# Patient Record
Sex: Female | Born: 1945 | Race: Black or African American | Hispanic: No | Smoking: Former smoker
Health system: Southern US, Community
[De-identification: ages and names within clinical notes are randomized; demographics above are authoritative.]

## PROBLEM LIST (undated history)

## (undated) DIAGNOSIS — D649 Anemia, unspecified: Secondary | ICD-10-CM

## (undated) DIAGNOSIS — Z8679 Personal history of other diseases of the circulatory system: Secondary | ICD-10-CM

## (undated) DIAGNOSIS — K219 Gastro-esophageal reflux disease without esophagitis: Secondary | ICD-10-CM

## (undated) DIAGNOSIS — H9190 Unspecified hearing loss, unspecified ear: Secondary | ICD-10-CM

## (undated) DIAGNOSIS — K635 Polyp of colon: Secondary | ICD-10-CM

## (undated) DIAGNOSIS — M1990 Unspecified osteoarthritis, unspecified site: Secondary | ICD-10-CM

## (undated) DIAGNOSIS — M1711 Unilateral primary osteoarthritis, right knee: Secondary | ICD-10-CM

## (undated) DIAGNOSIS — I10 Essential (primary) hypertension: Secondary | ICD-10-CM

## (undated) DIAGNOSIS — I82409 Acute embolism and thrombosis of unspecified deep veins of unspecified lower extremity: Secondary | ICD-10-CM

## (undated) DIAGNOSIS — R05 Cough: Secondary | ICD-10-CM

## (undated) DIAGNOSIS — J069 Acute upper respiratory infection, unspecified: Secondary | ICD-10-CM

## (undated) DIAGNOSIS — M1712 Unilateral primary osteoarthritis, left knee: Secondary | ICD-10-CM

## (undated) DIAGNOSIS — I739 Peripheral vascular disease, unspecified: Secondary | ICD-10-CM

## (undated) DIAGNOSIS — C55 Malignant neoplasm of uterus, part unspecified: Secondary | ICD-10-CM

## (undated) DIAGNOSIS — Z803 Family history of malignant neoplasm of breast: Secondary | ICD-10-CM

## (undated) HISTORY — PX: COLONOSCOPY W/ BIOPSIES: SHX1374

## (undated) HISTORY — DX: Acute embolism and thrombosis of unspecified deep veins of unspecified lower extremity: I82.409

## (undated) HISTORY — DX: Unspecified osteoarthritis, unspecified site: M19.90

## (undated) HISTORY — DX: Gastro-esophageal reflux disease without esophagitis: K21.9

## (undated) HISTORY — DX: Malignant neoplasm of uterus, part unspecified: C55

## (undated) HISTORY — DX: Peripheral vascular disease, unspecified: I73.9

## (undated) HISTORY — DX: Family history of malignant neoplasm of breast: Z80.3

## (undated) HISTORY — DX: Anemia, unspecified: D64.9

## (undated) HISTORY — DX: Unilateral primary osteoarthritis, left knee: M17.12

## (undated) HISTORY — DX: Polyp of colon: K63.5

---

## 2003-07-05 ENCOUNTER — Emergency Department (HOSPITAL_COMMUNITY): Admission: EM | Admit: 2003-07-05 | Discharge: 2003-07-06 | Payer: Self-pay | Admitting: Emergency Medicine

## 2007-05-17 ENCOUNTER — Emergency Department (HOSPITAL_COMMUNITY): Admission: EM | Admit: 2007-05-17 | Discharge: 2007-05-17 | Payer: Self-pay | Admitting: Family Medicine

## 2009-05-19 ENCOUNTER — Emergency Department (HOSPITAL_COMMUNITY): Admission: EM | Admit: 2009-05-19 | Discharge: 2009-05-19 | Payer: Self-pay | Admitting: Emergency Medicine

## 2009-05-21 ENCOUNTER — Emergency Department (HOSPITAL_COMMUNITY): Admission: EM | Admit: 2009-05-21 | Discharge: 2009-05-21 | Payer: Self-pay | Admitting: Emergency Medicine

## 2010-02-18 HISTORY — PX: ABDOMINAL HYSTERECTOMY: SHX81

## 2010-05-09 LAB — CBC
HCT: 36.9 % (ref 36.0–46.0)
Hemoglobin: 12.1 g/dL (ref 12.0–15.0)
MCHC: 32.9 g/dL (ref 30.0–36.0)
MCV: 87.7 fL (ref 78.0–100.0)
Platelets: 286 10*3/uL (ref 150–400)
RBC: 4.21 MIL/uL (ref 3.87–5.11)
RDW: 13.8 % (ref 11.5–15.5)
WBC: 5.7 10*3/uL (ref 4.0–10.5)

## 2010-05-09 LAB — COMPREHENSIVE METABOLIC PANEL
ALT: 15 U/L (ref 0–35)
AST: 16 U/L (ref 0–37)
Albumin: 3.8 g/dL (ref 3.5–5.2)
Alkaline Phosphatase: 87 U/L (ref 39–117)
BUN: 14 mg/dL (ref 6–23)
CO2: 25 mEq/L (ref 19–32)
Calcium: 9.4 mg/dL (ref 8.4–10.5)
Chloride: 104 mEq/L (ref 96–112)
Creatinine, Ser: 0.66 mg/dL (ref 0.4–1.2)
GFR calc Af Amer: >60 mL/min (ref >60)
GFR calc non Af Amer: >60 mL/min (ref >60)
Glucose, Bld: 92 mg/dL (ref 70–99)
Potassium: 3.7 mEq/L (ref 3.5–5.1)
Sodium: 136 mEq/L (ref 135–145)
Total Bilirubin: 0.7 mg/dL (ref 0.3–1.2)
Total Protein: 7.6 g/dL (ref 6.0–8.3)

## 2010-05-09 LAB — DIFFERENTIAL
Basophils Absolute: 0.1 10*3/uL (ref 0.0–0.1)
Basophils Relative: 1 % (ref 0–1)
Eosinophils Absolute: 0.1 10*3/uL (ref 0.0–0.7)
Eosinophils Relative: 1 % (ref 0–5)
Lymphocytes Relative: 42 % (ref 12–46)
Lymphs Abs: 2.4 10*3/uL (ref 0.7–4.0)
Monocytes Absolute: 0.5 10*3/uL (ref 0.1–1.0)
Monocytes Relative: 9 % (ref 3–12)
Neutro Abs: 2.7 10*3/uL (ref 1.7–7.7)
Neutrophils Relative %: 47 % (ref 43–77)

## 2010-06-13 ENCOUNTER — Ambulatory Visit: Payer: Medicare Other | Attending: Gynecology | Admitting: Gynecology

## 2010-06-13 DIAGNOSIS — E669 Obesity, unspecified: Secondary | ICD-10-CM | POA: Insufficient documentation

## 2010-06-13 DIAGNOSIS — C549 Malignant neoplasm of corpus uteri, unspecified: Secondary | ICD-10-CM | POA: Insufficient documentation

## 2010-06-14 NOTE — Consult Note (Signed)
[name], [name]                ACCOUNT NO.:  [id number]  MEDICAL RECORD NO.:  [id number]          PATIENT TYPE:  LOCATION:                                 FACILITY:  PHYSICIAN:  De Blanch, M.D. DATE OF BIRTH:  DATE OF CONSULTATION:  06/13/2010 DATE OF DISCHARGE:                                CONSULTATION   CHIEF COMPLAINT:  Endometrial cancer.  HISTORY OF PRESENT ILLNESS:  A 65 year old [place] American female seen in consultation at the request of Dr. Jennette Kettle regarding management of a newly diagnosed endometrial cancer.  Patient reports that she has had bleeding for approximately 2 years and recently presented to Dr. Donnetta Hail office for evaluation.  She underwent a D and C on June 04, 2010, with findings of a polypoid lesion in the endometrial cavity as well as a submucous myoma.  The polypoid lesion was excised with ring forceps and revealed an adenocarcinoma arising in the background of polypoid complex atypical hyperplasia.  It was felt that the lesion was grade II. Patient is also known to have small uterine fibroids measuring 3.1 cm, 2.0 cm, and 1.6 cm.  The total uterine size is 9.6 cm in length, 5.1 cm in width.  The ovaries appear normal.  PAST MEDICAL HISTORY AND MEDICAL ILLNESSES:  Obesity.  PAST SURGICAL HISTORY:  D and C in 2012.  CURRENT MEDICATIONS:  None.  DRUG ALLERGIES:  None.  FAMILY HISTORY:  Patient has a niece with breast cancer.  There is no other gynecologic or colon cancer in the family history.  SOCIAL HISTORY:  The patient lived in [place] for a long period of time, but has now returned to live in her family home.  She shares the house with her brothers and comes accompanied by one of her sisters. 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  She does not smoke.  REVIEW OF SYSTEMS:  Ten-point comprehensive review of systems negative except as noted above.  PHYSICAL EXAMINATION:  VITAL SIGNS:  Height 5 feet, weight 249  pounds, blood pressure 138/70. GENERAL:  Patient is a pleasant African American female in no apparent distress. HEENT:  Negative except for prominent probable lipoma on her right cheek. NECK:  Supple without thyromegaly.  There is no supraclavicular or inguinal adenopathy. ABDOMEN:  The abdomen is very obese.  No masses, organomegaly, or ascites are noted, although compromised by her obesity. PELVIC EXAMINATION:  EGBUS is normal.  Vagina appears normal.  No lesions are noted.  The cervix descends to nearly the introitus.  On bimanual exam, it feels to be a fibroid on the posterior uterus.  The entire uterine size is difficult to assess given the patient's obesity. RECTOVAGINAL EXAMINATION:  Confirms.  IMPRESSION:  Grade II endometrial cancer.  PLAN:  The treatment options were discussed with the patient including laparotomy, laparoscopy, robotic surgery and vaginal hysterectomy. Given the patient's obesity, I would recommend that we proceed with the vaginal hysterectomy and possible bilateral salpingo-oophorectomy. Patient and her sister are aware that should we find poor prognostic features that would benefit by performing lymphadenectomy, she would need a second procedure (most likely  with robot) to perform a lymphadenectomy.  The risks of surgery were reviewed and the patient and her sister are in agreement to proceed with surgery on Jul 03, 2010. They are aware that if we run into difficulties and cannot perform the vaginal hysterectomy, then an abdominal hysterectomy would need to be performed.  Finally, they understand that postoperative therapy may be recommended if she had poor prognostic features.     De Blanch, M.D.     DC/MEDQ  D:  06/13/2010  T:  06/13/2010  Job:  [id number]  cc:   Telford Nab, R.N. [address] [place], [place] [address]  W. Varney Baas, M.D. Fax: [phone]  Electronically Signed by De Blanch M.D. on 06/14/2010  03:57:55 PM

## 2010-06-22 ENCOUNTER — Encounter (HOSPITAL_COMMUNITY): Payer: PRIVATE HEALTH INSURANCE

## 2010-06-22 ENCOUNTER — Other Ambulatory Visit: Payer: Self-pay | Admitting: Gynecology

## 2010-06-22 ENCOUNTER — Ambulatory Visit (HOSPITAL_COMMUNITY)
Admission: RE | Admit: 2010-06-22 | Discharge: 2010-06-22 | Disposition: A | Discharge status: Home / Self Care | Payer: PRIVATE HEALTH INSURANCE | Source: Ambulatory Visit | Attending: Obstetrics & Gynecology | Admitting: Obstetrics & Gynecology

## 2010-06-22 ENCOUNTER — Other Ambulatory Visit: Payer: Self-pay | Admitting: Obstetrics & Gynecology

## 2010-06-22 DIAGNOSIS — Z01818 Encounter for other preprocedural examination: Secondary | ICD-10-CM

## 2010-06-22 DIAGNOSIS — Z01812 Encounter for preprocedural laboratory examination: Secondary | ICD-10-CM | POA: Insufficient documentation

## 2010-06-22 LAB — CBC
HCT: 36.3 % (ref 36.0–46.0)
Hemoglobin: 12.1 g/dL (ref 12.0–15.0)
MCH: 28.3 pg (ref 26.0–34.0)
MCHC: 33.3 g/dL (ref 30.0–36.0)
MCV: 84.8 fL (ref 78.0–100.0)
Platelets: 305 10*3/uL (ref 150–400)
RBC: 4.28 MIL/uL (ref 3.87–5.11)
RDW: 13.6 % (ref 11.5–15.5)
WBC: 5.1 10*3/uL (ref 4.0–10.5)

## 2010-06-22 LAB — COMPREHENSIVE METABOLIC PANEL
ALT: 11 U/L (ref 0–35)
AST: 13 U/L (ref 0–37)
Albumin: 3.6 g/dL (ref 3.5–5.2)
Alkaline Phosphatase: 99 U/L (ref 39–117)
BUN: 11 mg/dL (ref 6–23)
CO2: 24 mEq/L (ref 19–32)
Calcium: 9.6 mg/dL (ref 8.4–10.5)
Chloride: 103 mEq/L (ref 96–112)
Creatinine, Ser: 0.64 mg/dL (ref 0.4–1.2)
GFR calc Af Amer: >60 mL/min (ref >60)
GFR calc non Af Amer: >60 mL/min (ref >60)
Glucose, Bld: 93 mg/dL (ref 70–99)
Potassium: 3.9 mEq/L (ref 3.5–5.1)
Sodium: 136 mEq/L (ref 135–145)
Total Bilirubin: 0.3 mg/dL (ref 0.3–1.2)
Total Protein: 7.5 g/dL (ref 6.0–8.3)

## 2010-06-22 LAB — DIFFERENTIAL
Basophils Absolute: 0 10*3/uL (ref 0.0–0.1)
Basophils Relative: 0 % (ref 0–1)
Eosinophils Absolute: 0.1 10*3/uL (ref 0.0–0.7)
Eosinophils Relative: 2 % (ref 0–5)
Lymphocytes Relative: 44 % (ref 12–46)
Lymphs Abs: 2.2 10*3/uL (ref 0.7–4.0)
Monocytes Absolute: 0.4 10*3/uL (ref 0.1–1.0)
Monocytes Relative: 8 % (ref 3–12)
Neutro Abs: 2.3 10*3/uL (ref 1.7–7.7)
Neutrophils Relative %: 46 % (ref 43–77)

## 2010-06-22 LAB — SURGICAL PCR SCREEN
MRSA, PCR: NEGATIVE
Staphylococcus aureus: NEGATIVE

## 2010-07-03 ENCOUNTER — Other Ambulatory Visit: Payer: Self-pay | Admitting: Gynecology

## 2010-07-03 ENCOUNTER — Ambulatory Visit (HOSPITAL_COMMUNITY)
Admission: RE | Admit: 2010-07-03 | Discharge: 2010-07-05 | Disposition: A | Discharge status: Home / Self Care | Payer: PRIVATE HEALTH INSURANCE | Source: Ambulatory Visit | Attending: Obstetrics & Gynecology | Admitting: Obstetrics & Gynecology

## 2010-07-03 DIAGNOSIS — R109 Unspecified abdominal pain: Secondary | ICD-10-CM | POA: Insufficient documentation

## 2010-07-03 DIAGNOSIS — E669 Obesity, unspecified: Secondary | ICD-10-CM | POA: Insufficient documentation

## 2010-07-03 DIAGNOSIS — R9431 Abnormal electrocardiogram [ECG] [EKG]: Secondary | ICD-10-CM | POA: Insufficient documentation

## 2010-07-03 DIAGNOSIS — D259 Leiomyoma of uterus, unspecified: Secondary | ICD-10-CM | POA: Insufficient documentation

## 2010-07-03 DIAGNOSIS — C549 Malignant neoplasm of corpus uteri, unspecified: Secondary | ICD-10-CM | POA: Insufficient documentation

## 2010-07-03 DIAGNOSIS — D500 Iron deficiency anemia secondary to blood loss (chronic): Secondary | ICD-10-CM | POA: Insufficient documentation

## 2010-07-03 LAB — ABO/RH: ABO/RH(D): B POS

## 2010-07-03 LAB — TYPE AND SCREEN
ABO/RH(D): B POS
Antibody Screen: NEGATIVE

## 2010-07-04 LAB — BASIC METABOLIC PANEL
BUN: 4 mg/dL — ABNORMAL LOW (ref 6–23)
CO2: 26 mEq/L (ref 19–32)
Calcium: 8.1 mg/dL — ABNORMAL LOW (ref 8.4–10.5)
Chloride: 103 mEq/L (ref 96–112)
Creatinine, Ser: 0.48 mg/dL (ref 0.4–1.2)
GFR calc Af Amer: >60 mL/min (ref >60)
GFR calc non Af Amer: >60 mL/min (ref >60)
Glucose, Bld: 133 mg/dL — ABNORMAL HIGH (ref 70–99)
Potassium: 3.5 mEq/L (ref 3.5–5.1)
Sodium: 135 mEq/L (ref 135–145)

## 2010-07-04 LAB — CBC
HCT: 31.1 % — ABNORMAL LOW (ref 36.0–46.0)
Hemoglobin: 9.8 g/dL — ABNORMAL LOW (ref 12.0–15.0)
MCH: 27.1 pg (ref 26.0–34.0)
MCHC: 31.5 g/dL (ref 30.0–36.0)
MCV: 85.9 fL (ref 78.0–100.0)
Platelets: 245 10*3/uL (ref 150–400)
RBC: 3.62 MIL/uL — ABNORMAL LOW (ref 3.87–5.11)
RDW: 13.8 % (ref 11.5–15.5)
WBC: 6.4 10*3/uL (ref 4.0–10.5)

## 2010-07-04 LAB — HEMOGLOBIN: Hemoglobin: 11.1 g/dL — ABNORMAL LOW (ref 12.0–15.0)

## 2010-07-04 LAB — HEMATOCRIT: HCT: 34.5 % — ABNORMAL LOW (ref 36.0–46.0)

## 2010-07-05 NOTE — Op Note (Signed)
[name], [name]                ACCOUNT NO.:  [id number]  MEDICAL RECORD NO.:  [id number]           PATIENT TYPE:  O  LOCATION:  DAYL                         FACILITY:  [facility]  PHYSICIAN:  De Blanch, M.D.DATE OF BIRTH:  1945-10-14  DATE OF PROCEDURE: DATE OF DISCHARGE:                              OPERATIVE REPORT   PREOPERATIVE DIAGNOSES: 1. Grade 2 endometrial carcinoma. 2. Uterine fibroids.  POSTOPERATIVE DIAGNOSES: 1. Grade 2 endometrial carcinoma. 2. Uterine fibroids.  PROCEDURES:  Vaginal hysterectomy and bilateral salpingo-oophorectomy.  SURGEON:  De Blanch, MD  ASSISTANT: 1. Roseanna Rainbow, MD 2. Telford Nab, R.N.  ANESTHESIA:  General through orotracheal tube.  ESTIMATED BLOOD LOSS:  150 mL.  SURGICAL FINDINGS:  Examination under anesthesia revealed uterus with excellent descensus.  The uterus is enlarged, approximately 10-week size with multiple small uterine fibroids.  The tubes and ovaries appeared normal.  DESCRIPTION OF PROCEDURE:  The patient was brought to the Operating Room.  After satisfactory attainment of general anesthesia, he was placed in the modified lithotomy position in [place] stirrups.  Then the vulva and vagina were prepped.  The patient was draped and a Foley catheter was inserted.  Weighted speculum was placed in the posterior vagina and a Deaver was placed anteriorly.  The cervix was grasped with Lahey clamps.  The cervical vaginal junction was injected with 1% lidocaine with epinephrine and then an incision was made in the vaginal mucosa at its junction with the cervix between 9 and 3 o'clock.  The vagina and bladder were mobilized away from the cervix.  The posterior cul-de-sac was grasped and opened sharply.  Suture was placed at 12 o'clock incorporating the pelvic peritoneum and vaginal mucosa. Uterosacral ligaments were clamped, cut, suture ligated and held.  In a step-wise fashion, the cardinal  ligaments were clamped, cut and suture ligated.  We continued to mobilize the bladder until the peritoneal cavity was entered.  The uterine vessels were clamped, cut and suture ligated.  The uterus was then delivered through the posterior cul-de-sac and the upper pedicle was clamped, cut, pre-tied and suture ligated. The uterus and cervix were handed off the operative field.  The tubes and ovaries were identified, they were small.  We were able to mobilize them into the field and then place a clamp across the infundibulopelvic ligament, which was then divided removing the tubes and ovaries bilaterally.  The upper pedicle was secured with suture ligature of 2-0 Vicryl.  All pedicles were inspected and found to be hemostatic.  There was some bleeding from the vaginal cuff, which was controlled once the cuff was closed.  The sutures, which we held of the uterosacral ligaments were then sutured to the vaginal cuff to add some additional support.  The vagina was then closed in a single layer using 2-0 Vicryl suture with interrupted sutures incorporating peritoneum and vaginal mucosa.  The central portion of the vagina was closed in a vertical fashion as its redundancy made this the best way to approximate it. Hemostasis was ascertained.  The patient was awakened from anesthesia and taken to the recovery room in satisfactory condition.  Sponge, needle and instrument counts were correct x2.     De Blanch, M.D.     DC/MEDQ  D:  07/03/2010  T:  07/03/2010  Job:  [id number]  cc:   Telford Nab, R.N. [address] [place], [place] [address]  W. Varney Baas, M.D. Fax: [phone]  Electronically Signed by De Blanch M.D. on 07/05/2010 10:09:52 AM

## 2010-08-10 ENCOUNTER — Ambulatory Visit: Payer: PRIVATE HEALTH INSURANCE | Attending: Gynecology | Admitting: Gynecology

## 2010-08-10 DIAGNOSIS — C549 Malignant neoplasm of corpus uteri, unspecified: Secondary | ICD-10-CM | POA: Insufficient documentation

## 2010-08-10 DIAGNOSIS — Z9079 Acquired absence of other genital organ(s): Secondary | ICD-10-CM | POA: Insufficient documentation

## 2010-08-10 DIAGNOSIS — Z90710 Acquired absence of both cervix and uterus: Secondary | ICD-10-CM | POA: Insufficient documentation

## 2010-08-13 NOTE — Consult Note (Signed)
  [name], [name]                ACCOUNT NO.:  [id number]  MEDICAL RECORD NO.:  [id number]  LOCATION:  GYN                          FACILITY:  [facility]  PHYSICIAN:  De Blanch, M.D.DATE OF BIRTH:  [birth year]  DATE OF CONSULTATION:  08/10/2010 DATE OF DISCHARGE:                                CONSULTATION   CHIEF COMPLAINT:  Postoperative followup, endometrial cancer.  INTERVAL HISTORY:  The patient returns today for first postoperative followup having undergone a vaginal hysterectomy and bilateral salpingo- oophorectomy on Jul 03, 2010.  Final pathology showed a grade 2 endometrial carcinoma limited to the inner half of the myometrium and associated with complex atypical hyperplasia.  The cervix was free of disease as were both tubes and ovaries.  Peritoneal washings were also negative.  The patient has had an uncomplicated postoperative course. She has normal GI and GU function and has no limitations on her activity and feels quite well.  POSTOPERATIVE DIAGNOSIS:  VITAL SIGNS:  Weight 248 pounds, blood pressure 130/68, pulse 80, respiratory rate 20. GENERAL:  The patient is a healthy African-American female, in no acute distress. HEENT:  Negative. NECK:  Supple without thyromegaly.  There is no supraclavicular or inguinal adenopathy. ABDOMEN:  Obese, soft, nontender.  No mass, organomegaly, ascites or hernias noted. PELVIC EXAM:  EGBUS, vagina, bladder, urethra are normal.  The vaginal cuff is well supported and healed.  No lesions are noted.  Bimanual and rectovaginal exam revealed no mass, induration, or nodularity.  IMPRESSION:  Stage IA grade 2 endometrial carcinoma.  Given the good prognostic features, I do not believe any adjuvant therapy should be recommended.  The patient is given the okay to return to full levels of activity.  According to current guidelines, we will have the patient see Dr. Jennette Kettle in 6 months and return to see me in 1 year.     De Blanch, M.D.     DC/MEDQ  D:  08/10/2010  T:  08/10/2010  Job:  [id number]  cc:   Freddy Finner, M.D. Fax: [phone]  Telford Nab, R.N. [address] [place], [place] [address]  Electronically Signed by De Blanch M.D. on 08/13/2010 01:05:22 PM

## 2010-11-12 LAB — DIFFERENTIAL
Basophils Absolute: 0
Basophils Relative: 1
Eosinophils Absolute: 0.1
Eosinophils Relative: 2
Lymphocytes Relative: 38
Lymphs Abs: 2
Monocytes Absolute: 0.4
Monocytes Relative: 8
Neutro Abs: 2.8
Neutrophils Relative %: 53

## 2010-11-12 LAB — POCT I-STAT, CHEM 8
BUN: 12
Calcium, Ion: 1.19
Chloride: 105
Creatinine, Ser: 0.8
Glucose, Bld: 101 — ABNORMAL HIGH
HCT: 37
Hemoglobin: 12.6
Potassium: 3.9
Sodium: 137
TCO2: 25

## 2010-11-12 LAB — POCT CARDIAC MARKERS
CKMB, poc: <1 — ABNORMAL LOW
Myoglobin, poc: 49
Operator id: 294521
Troponin i, poc: <0.05

## 2010-11-12 LAB — CBC
HCT: 35.3 — ABNORMAL LOW
Hemoglobin: 12
MCHC: 33.9
MCV: 84.1
Platelets: 328
RBC: 4.2
RDW: 13.7
WBC: 5.4

## 2010-11-12 LAB — D-DIMER, QUANTITATIVE: D-Dimer, Quant: 0.25

## 2011-04-12 ENCOUNTER — Encounter (HOSPITAL_COMMUNITY): Payer: Self-pay | Admitting: Pharmacy Technician

## 2011-04-23 ENCOUNTER — Encounter: Payer: Self-pay | Admitting: Physician Assistant

## 2011-04-23 ENCOUNTER — Other Ambulatory Visit: Payer: Self-pay | Admitting: Physician Assistant

## 2011-04-23 DIAGNOSIS — M1712 Unilateral primary osteoarthritis, left knee: Secondary | ICD-10-CM

## 2011-04-23 HISTORY — DX: Unilateral primary osteoarthritis, left knee: M17.12

## 2011-04-23 NOTE — H&P (Signed)
[name] is an 66 y.o. female.   Chief Complaint: left knee DJD HPI: [name] is a 66 year old seen at the request of Dr. Farris Has for significant bilateral knee pain left worse than right. This has been long-standing getting progressively worse. Pain with weightbearing and activity relieved by rest. She has difficulty walking without pain and getting up and starting to walk is very painful and she needs help to stand up. She does not take any medications for this and takes no regular medications. The excruciating pain is getting progressively worse, limiting activities of daily living, poses a significant fall risk, and has failed multiple conservative treatments including intraarticular cortisone injections, intraarticular Supartz, bracing, medication and home physical therapy.  History reviewed. No pertinent past medical history.  Past Surgical History  Procedure Date  . Abdominal hysterectomy     Family History  Problem Relation Age of Onset  . Arthritis Mother   . Hypertension Mother   . Alzheimer's disease Father   . Diabetes Sister   . Hypertension Sister   . Hypertension Brother   . Stroke Brother   . Hypertension Other   . Hypertension Brother   . Hypertension Sister   . Hypertension Sister   . Hypertension Sister    Social History:  reports that she quit smoking about 23 years ago. She has never used smokeless tobacco. She reports that she does not drink alcohol or use illicit drugs.  Allergies: No Known Allergies  Medications Prior to Admission  Medication Sig Dispense Refill  . OVER THE COUNTER MEDICATION Take 2 tablets by mouth every 6 (six) hours as needed. Tylenol for pain       No current facility-administered medications on file as of 04/23/2011.    No results found for this or any previous visit (from the past 48 hour(s)). No results found.  Review of Systems  Constitutional: Negative.   HENT: Negative.  Negative for neck pain.   Eyes: Positive for  blurred vision, double vision, photophobia, pain, discharge and redness.       Large benign cyst under right eye  Respiratory: Negative.   Cardiovascular: Negative.   Gastrointestinal: Negative.   Genitourinary: Negative.   Musculoskeletal: Positive for joint pain. Negative for myalgias, back pain and falls.       Left knee  Skin: Negative.   Neurological: Negative.   Endo/Heme/Allergies: Negative.   Psychiatric/Behavioral: Negative.     Blood pressure 146/73, pulse 88, temperature 98.2 F (36.8 C), temperature source Oral, height 5\' 1"  (1.549 m), weight 112.038 kg (247 lb), SpO2 100.00%. Physical Exam  Constitutional: She is oriented to person, place, and time. She appears well-developed and well-nourished.  HENT:  Head: Normocephalic and atraumatic.  Mouth/Throat: Oropharynx is clear and moist.  Eyes: Conjunctivae and EOM are normal. Pupils are equal, round, and reactive to light.  Neck: Normal range of motion. Neck supple.  Cardiovascular: Normal rate, regular rhythm and normal heart sounds.   Respiratory: Effort normal and breath sounds normal.  GI: Soft. Bowel sounds are normal.  Genitourinary:       Not pertinent to current symptomatology therefore not examined.  Musculoskeletal:       . Examination of her left knee reveals 1 to 2+ crepitation 1+ synovitis, range of motion 0-120 degrees knee is stable with normal patella tracking mild varus deformity and diffuse pain. Exam of her right knee reveals 1+ crepitation 1+ synovitis range of motion 0-120 degrees knee is stable with diffuse pain and  normal patella tracking. Vascular exam: pulses 1+ and symmetric  Neurological: She is alert and oriented to person, place, and time. She has normal reflexes.  Skin: Skin is warm and dry.  Psychiatric: She has a normal mood and affect. Her behavior is normal. Judgment and thought content normal.    Xray AP,LATERAL, FLEXION, and SUNRISE views show significant joint space narrowing, with  periarticular osteophytes, and subchondral sclerosis.  Assessment Patient Active Problem List  Diagnoses  . Left knee DJD    Plan I talk to her about this in detail. I do feel with the findings on x-rays and significant disability and pain that she is a candidate for left total knee replacement. Discussed risks benefits and possible complications of the surgery in detail and she understands this completely.   She has been evaluated preoperatively by Dr Junie Bame office and determined to be a low risk for the planned surgery.  Wanda Rideout J 04/23/2011, 3:58 PM

## 2011-04-25 ENCOUNTER — Encounter (HOSPITAL_COMMUNITY)
Admission: RE | Admit: 2011-04-25 | Discharge: 2011-04-25 | Disposition: A | Discharge status: Home / Self Care | Payer: PRIVATE HEALTH INSURANCE | Source: Ambulatory Visit | Attending: Orthopedic Surgery | Admitting: Orthopedic Surgery

## 2011-04-25 ENCOUNTER — Other Ambulatory Visit: Payer: Self-pay

## 2011-04-25 ENCOUNTER — Encounter (HOSPITAL_COMMUNITY): Payer: Self-pay

## 2011-04-25 LAB — CBC
HCT: 37 % (ref 36.0–46.0)
Hemoglobin: 12.5 g/dL (ref 12.0–15.0)
MCH: 28.1 pg (ref 26.0–34.0)
MCHC: 33.8 g/dL (ref 30.0–36.0)
MCV: 83.1 fL (ref 78.0–100.0)
Platelets: 291 10*3/uL (ref 150–400)
RBC: 4.45 MIL/uL (ref 3.87–5.11)
RDW: 13.6 % (ref 11.5–15.5)
WBC: 5.4 10*3/uL (ref 4.0–10.5)

## 2011-04-25 LAB — URINALYSIS, ROUTINE W REFLEX MICROSCOPIC
Bilirubin Urine: NEGATIVE
Glucose, UA: NEGATIVE mg/dL
Ketones, ur: NEGATIVE mg/dL
Leukocytes, UA: NEGATIVE
Nitrite: NEGATIVE
Protein, ur: NEGATIVE mg/dL
Specific Gravity, Urine: 1.013 (ref 1.005–1.030)
Urobilinogen, UA: 0.2 mg/dL (ref 0.0–1.0)
pH: 6 (ref 5.0–8.0)

## 2011-04-25 LAB — COMPREHENSIVE METABOLIC PANEL
ALT: 12 U/L (ref 0–35)
AST: 13 U/L (ref 0–37)
Albumin: 3.6 g/dL (ref 3.5–5.2)
Alkaline Phosphatase: 100 U/L (ref 39–117)
BUN: 10 mg/dL (ref 6–23)
CO2: 23 mEq/L (ref 19–32)
Calcium: 9.8 mg/dL (ref 8.4–10.5)
Chloride: 104 mEq/L (ref 96–112)
Creatinine, Ser: 0.64 mg/dL (ref 0.50–1.10)
GFR calc Af Amer: >90 mL/min (ref >90)
GFR calc non Af Amer: >90 mL/min (ref >90)
Glucose, Bld: 93 mg/dL (ref 70–99)
Potassium: 4.1 mEq/L (ref 3.5–5.1)
Sodium: 136 mEq/L (ref 135–145)
Total Bilirubin: 0.3 mg/dL (ref 0.3–1.2)
Total Protein: 8.2 g/dL (ref 6.0–8.3)

## 2011-04-25 LAB — DIFFERENTIAL
Basophils Absolute: 0 10*3/uL (ref 0.0–0.1)
Basophils Relative: 1 % (ref 0–1)
Eosinophils Absolute: 0.1 10*3/uL (ref 0.0–0.7)
Eosinophils Relative: 2 % (ref 0–5)
Lymphocytes Relative: 43 % (ref 12–46)
Lymphs Abs: 2.3 10*3/uL (ref 0.7–4.0)
Monocytes Absolute: 0.5 10*3/uL (ref 0.1–1.0)
Monocytes Relative: 10 % (ref 3–12)
Neutro Abs: 2.4 10*3/uL (ref 1.7–7.7)
Neutrophils Relative %: 45 % (ref 43–77)

## 2011-04-25 LAB — URINE MICROSCOPIC-ADD ON

## 2011-04-25 LAB — SURGICAL PCR SCREEN
MRSA, PCR: NEGATIVE
Staphylococcus aureus: NEGATIVE

## 2011-04-25 LAB — PROTIME-INR
INR: 1.19 (ref 0.00–1.49)
Prothrombin Time: 15.4 seconds — ABNORMAL HIGH (ref 11.6–15.2)

## 2011-04-25 LAB — APTT: aPTT: 30 seconds (ref 24–37)

## 2011-04-25 LAB — TYPE AND SCREEN
ABO/RH(D): B POS
Antibody Screen: NEGATIVE

## 2011-04-25 LAB — ABO/RH: ABO/RH(D): B POS

## 2011-04-25 MED ORDER — CHLORHEXIDINE GLUCONATE 4 % EX LIQD: 60.0000 mL | Freq: Once | CUTANEOUS | Status: DC

## 2011-04-25 NOTE — Progress Notes (Signed)
1250  04/25/2011      Have searched records to see if patient has PCP....after reviewing Kirsten's note....came up with a Dr. Larita Fife at [facility] [phone])..  Requested  Old ekg and office note.  Present Ekg Indicates " R BBB  With T wave abnormality, consider inferior ischemia"  I have spoken with Genelle Bal about the abnl  Ekg....she wants to be called [phone]) after Being reviewed by Shonna Chock, PA....to see whether we need cardiac clearance prior to surgery.

## 2011-04-25 NOTE — Progress Notes (Signed)
Pt doesn't have a cardiologist  Denies ever having a heart cath/echo/stress test

## 2011-04-25 NOTE — Consult Note (Signed)
Anesthesia:  Patient is a 66 year old female scheduled for a left TKA on 04/29/11.  History includes former smoker, obesity, back pain, and left knee DJD. She is s/p a vaginal hysterectomy with BSO on 06/30/10 for Grade 2 endometrial carcinoma and fibroids.  She is not taking any prescription medications according to her current medication list.  BP at PAT was 133/75.  She was medically cleared by NP Elizabeth Palau at [facility] on 03/14/11.  Her urine culture is still pending.  Otherwise her labs are essentially normal.  CXR from 06/22/10 showed no acute process.  EKG from today showed NSR, right BBB, anterior and inferior T wave abnormality, minimal voltage criteria for LVH.  It does not look significantly changed since January.  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  Inferior T waves inversion appears overall new or at least more prominent since 2009.  She did not report any CV symptoms during her PAT appointment or during her recent medical clearance visit.  I reviewed with Anesthesiologist Dr. Krista Blue.  Plan to proceed if patient remains asymptomatic.

## 2011-04-25 NOTE — Pre-Procedure Instructions (Signed)
20 [name]  04/25/2011   Your procedure is scheduled on:  Mon, Mar 11 @ 0900  Report to [facility] at 0700 AM.  Call this number if you have problems the morning of surgery: [phone]   Remember:   Do not eat food:After Midnight.  May have clear liquids: up to 4 Hours before arrival.(until 3:00 am)  Clear liquids include soda, tea, black coffee, apple or grape juice, broth.  Take these medicines the morning of surgery with A SIP OF WATER:    Do not wear jewelry, make-up or nail polish.  Do not wear lotions, powders, or perfumes. You may wear deodorant.  Do not shave 48 hours prior to surgery.  Do not bring valuables to the hospital.  Contacts, dentures or bridgework may not be worn into surgery.  Leave suitcase in the car. After surgery it may be brought to your room.  For patients admitted to the hospital, checkout time is 11:00 AM the day of discharge.   Patients discharged the day of surgery will not be allowed to drive home.    Special Instructions: Incentive Spirometry - Practice and bring it with you on the day of surgery. and CHG Shower Use Special Wash: 1/2 bottle night before surgery and 1/2 bottle morning of surgery.   Please read over the following fact sheets that you were given: Pain Booklet, Coughing and Deep Breathing, Blood Transfusion Information, Total Joint Packet, MRSA Information and Surgical Site Infection Prevention

## 2011-04-26 LAB — URINE CULTURE
Colony Count: NO GROWTH
Culture  Setup Time: 201303071155
Culture: NO GROWTH

## 2011-04-28 MED ORDER — CEFAZOLIN SODIUM-DEXTROSE 2-3 GM-% IV SOLR
2.0000 g | INTRAVENOUS | Status: AC
  Administered 2011-04-29: 2 g via INTRAVENOUS
  Filled 2011-04-28: qty 50

## 2011-04-29 ENCOUNTER — Ambulatory Visit (HOSPITAL_COMMUNITY): Payer: PRIVATE HEALTH INSURANCE | Admitting: Vascular Surgery

## 2011-04-29 ENCOUNTER — Encounter (HOSPITAL_COMMUNITY)
Admission: RE | Disposition: A | Discharge status: Home Health | Payer: Self-pay | Source: Ambulatory Visit | Attending: Orthopedic Surgery

## 2011-04-29 ENCOUNTER — Encounter (HOSPITAL_COMMUNITY): Payer: Self-pay | Admitting: Vascular Surgery

## 2011-04-29 ENCOUNTER — Inpatient Hospital Stay (HOSPITAL_COMMUNITY)
Admission: RE | Admit: 2011-04-29 | Discharge: 2011-05-01 | DRG: 470 | Disposition: A | Discharge status: Home Health | Payer: PRIVATE HEALTH INSURANCE | Source: Ambulatory Visit | Attending: Orthopedic Surgery | Admitting: Orthopedic Surgery

## 2011-04-29 ENCOUNTER — Encounter (HOSPITAL_COMMUNITY): Payer: Self-pay | Admitting: *Deleted

## 2011-04-29 DIAGNOSIS — M1710 Unilateral primary osteoarthritis, unspecified knee: Principal | ICD-10-CM | POA: Diagnosis present

## 2011-04-29 DIAGNOSIS — Z833 Family history of diabetes mellitus: Secondary | ICD-10-CM

## 2011-04-29 DIAGNOSIS — Z01812 Encounter for preprocedural laboratory examination: Secondary | ICD-10-CM

## 2011-04-29 DIAGNOSIS — D62 Acute posthemorrhagic anemia: Secondary | ICD-10-CM

## 2011-04-29 DIAGNOSIS — Z90710 Acquired absence of both cervix and uterus: Secondary | ICD-10-CM

## 2011-04-29 DIAGNOSIS — Z87891 Personal history of nicotine dependence: Secondary | ICD-10-CM

## 2011-04-29 DIAGNOSIS — M1712 Unilateral primary osteoarthritis, left knee: Secondary | ICD-10-CM

## 2011-04-29 DIAGNOSIS — Z8249 Family history of ischemic heart disease and other diseases of the circulatory system: Secondary | ICD-10-CM

## 2011-04-29 DIAGNOSIS — Z8261 Family history of arthritis: Secondary | ICD-10-CM

## 2011-04-29 HISTORY — PX: TOTAL KNEE ARTHROPLASTY: SHX125

## 2011-04-29 SURGERY — ARTHROPLASTY, KNEE, TOTAL
Anesthesia: General | Site: Knee | Laterality: Left | Wound class: Clean

## 2011-04-29 MED ORDER — CEFUROXIME SODIUM 1.5 G IJ SOLR
INTRAMUSCULAR | Status: DC | PRN
  Administered 2011-04-29: 1.5 g

## 2011-04-29 MED ORDER — ENOXAPARIN SODIUM 30 MG/0.3ML ~~LOC~~ SOLN
30.0000 mg | Freq: Two times a day (BID) | SUBCUTANEOUS | Status: DC
  Administered 2011-04-30 – 2011-05-01 (×3): 30 mg via SUBCUTANEOUS
  Filled 2011-04-29 (×5): qty 0.3

## 2011-04-29 MED ORDER — PHENOL 1.4 % MT LIQD: 1.0000 | OROMUCOSAL | Status: DC | PRN

## 2011-04-29 MED ORDER — METOCLOPRAMIDE HCL 5 MG/ML IJ SOLN: 5.0000 mg | Freq: Three times a day (TID) | INTRAMUSCULAR | Status: DC | PRN

## 2011-04-29 MED ORDER — CEFAZOLIN SODIUM-DEXTROSE 2-3 GM-% IV SOLR
2.0000 g | Freq: Four times a day (QID) | INTRAVENOUS | Status: AC
  Administered 2011-04-29 – 2011-04-30 (×3): 2 g via INTRAVENOUS
  Filled 2011-04-29 (×3): qty 50

## 2011-04-29 MED ORDER — FENTANYL CITRATE 0.05 MG/ML IJ SOLN
50.0000 ug | INTRAMUSCULAR | Status: DC | PRN
  Administered 2011-04-29: 100 ug via INTRAVENOUS

## 2011-04-29 MED ORDER — LACTATED RINGERS IV SOLN
INTRAVENOUS | Status: DC
  Administered 2011-04-29: 20 mL via INTRAVENOUS

## 2011-04-29 MED ORDER — METOCLOPRAMIDE HCL 5 MG PO TABS
5.0000 mg | ORAL_TABLET | Freq: Three times a day (TID) | ORAL | Status: DC | PRN
  Filled 2011-04-29: qty 2

## 2011-04-29 MED ORDER — DOCUSATE SODIUM 100 MG PO CAPS
100.0000 mg | ORAL_CAPSULE | Freq: Two times a day (BID) | ORAL | Status: DC
  Administered 2011-04-29 – 2011-05-01 (×4): 100 mg via ORAL
  Filled 2011-04-29 (×5): qty 1

## 2011-04-29 MED ORDER — ONDANSETRON HCL 4 MG/2ML IJ SOLN
4.0000 mg | Freq: Four times a day (QID) | INTRAMUSCULAR | Status: DC | PRN
Start: 2011-04-29 — End: 2011-04-29

## 2011-04-29 MED ORDER — ACETAMINOPHEN 325 MG PO TABS
650.0000 mg | ORAL_TABLET | Freq: Four times a day (QID) | ORAL | Status: DC | PRN
  Administered 2011-04-30: 650 mg via ORAL
  Filled 2011-04-29: qty 2

## 2011-04-29 MED ORDER — ROCURONIUM BROMIDE 100 MG/10ML IV SOLN
INTRAVENOUS | Status: DC | PRN
  Administered 2011-04-29: 50 mg via INTRAVENOUS

## 2011-04-29 MED ORDER — ONDANSETRON HCL 4 MG/2ML IJ SOLN
INTRAMUSCULAR | Status: DC | PRN
  Administered 2011-04-29 (×2): 4 mg via INTRAVENOUS

## 2011-04-29 MED ORDER — PROPOFOL 10 MG/ML IV EMUL
INTRAVENOUS | Status: DC | PRN
  Administered 2011-04-29: 180 mg via INTRAVENOUS

## 2011-04-29 MED ORDER — FENTANYL CITRATE 0.05 MG/ML IJ SOLN
INTRAMUSCULAR | Status: AC
  Filled 2011-04-29: qty 2

## 2011-04-29 MED ORDER — LIDOCAINE HCL (CARDIAC) 20 MG/ML IV SOLN
INTRAVENOUS | Status: DC | PRN
  Administered 2011-04-29: 80 mg via INTRAVENOUS

## 2011-04-29 MED ORDER — ONDANSETRON HCL 4 MG/2ML IJ SOLN
4.0000 mg | Freq: Four times a day (QID) | INTRAMUSCULAR | Status: DC | PRN
  Administered 2011-04-30: 4 mg via INTRAVENOUS
  Filled 2011-04-29: qty 2

## 2011-04-29 MED ORDER — SODIUM CHLORIDE 0.9 % IR SOLN
Status: DC | PRN
  Administered 2011-04-29: 3000 mL

## 2011-04-29 MED ORDER — LACTATED RINGERS IV SOLN
INTRAVENOUS | Status: DC | PRN
  Administered 2011-04-29 (×3): via INTRAVENOUS

## 2011-04-29 MED ORDER — FENTANYL CITRATE 0.05 MG/ML IJ SOLN
INTRAMUSCULAR | Status: DC | PRN
  Administered 2011-04-29: 50 ug via INTRAVENOUS
  Administered 2011-04-29: 100 ug via INTRAVENOUS
  Administered 2011-04-29 (×2): 50 ug via INTRAVENOUS

## 2011-04-29 MED ORDER — MENTHOL 3 MG MT LOZG: 1.0000 | LOZENGE | OROMUCOSAL | Status: DC | PRN

## 2011-04-29 MED ORDER — ONDANSETRON HCL 4 MG PO TABS: 4.0000 mg | ORAL_TABLET | Freq: Four times a day (QID) | ORAL | Status: DC | PRN

## 2011-04-29 MED ORDER — LABETALOL HCL 5 MG/ML IV SOLN
INTRAVENOUS | Status: DC | PRN
  Administered 2011-04-29: 10 mg via INTRAVENOUS

## 2011-04-29 MED ORDER — GLYCOPYRROLATE 0.2 MG/ML IJ SOLN
INTRAMUSCULAR | Status: DC | PRN
  Administered 2011-04-29: .8 mg via INTRAVENOUS

## 2011-04-29 MED ORDER — HYDROMORPHONE HCL PF 1 MG/ML IJ SOLN
0.2500 mg | INTRAMUSCULAR | Status: DC | PRN
  Administered 2011-04-29 (×4): 0.25 mg via INTRAVENOUS

## 2011-04-29 MED ORDER — HYDROCODONE-ACETAMINOPHEN 5-325 MG PO TABS
1.0000 | ORAL_TABLET | ORAL | Status: DC | PRN
  Administered 2011-04-30: 2 via ORAL
  Filled 2011-04-29: qty 2

## 2011-04-29 MED ORDER — NEOSTIGMINE METHYLSULFATE 1 MG/ML IJ SOLN
INTRAMUSCULAR | Status: DC | PRN
  Administered 2011-04-29: 5 mg via INTRAVENOUS

## 2011-04-29 MED ORDER — MIDAZOLAM HCL 2 MG/2ML IJ SOLN
1.0000 mg | INTRAMUSCULAR | Status: DC | PRN
  Administered 2011-04-29: 2 mg via INTRAVENOUS

## 2011-04-29 MED ORDER — POTASSIUM CHLORIDE IN NACL 20-0.9 MEQ/L-% IV SOLN
INTRAVENOUS | Status: DC
  Administered 2011-04-29: 18:00:00 via INTRAVENOUS
  Filled 2011-04-29 (×7): qty 1000

## 2011-04-29 MED ORDER — MIDAZOLAM HCL 2 MG/2ML IJ SOLN
INTRAMUSCULAR | Status: AC
  Filled 2011-04-29: qty 2

## 2011-04-29 MED ORDER — WHITE PETROLATUM GEL
Status: AC
  Filled 2011-04-29: qty 5

## 2011-04-29 MED ORDER — BUPIVACAINE-EPINEPHRINE PF 0.5-1:200000 % IJ SOLN
INTRAMUSCULAR | Status: DC | PRN
  Administered 2011-04-29: 30 mL

## 2011-04-29 MED ORDER — POVIDONE-IODINE 7.5 % EX SOLN: Freq: Once | CUTANEOUS | Status: DC

## 2011-04-29 MED ORDER — BISACODYL 5 MG PO TBEC
10.0000 mg | DELAYED_RELEASE_TABLET | Freq: Every day | ORAL | Status: DC
  Administered 2011-04-29 – 2011-04-30 (×2): 10 mg via ORAL
  Filled 2011-04-29 (×2): qty 2

## 2011-04-29 MED ORDER — HYDROMORPHONE HCL PF 1 MG/ML IJ SOLN
0.5000 mg | INTRAMUSCULAR | Status: DC | PRN
  Administered 2011-04-29 – 2011-04-30 (×5): 1 mg via INTRAVENOUS
  Filled 2011-04-29 (×6): qty 1

## 2011-04-29 MED ORDER — ACETAMINOPHEN 650 MG RE SUPP: 650.0000 mg | Freq: Four times a day (QID) | RECTAL | Status: DC | PRN

## 2011-04-29 MED ORDER — OXYCODONE-ACETAMINOPHEN 5-325 MG PO TABS
1.0000 | ORAL_TABLET | ORAL | Status: DC | PRN
  Administered 2011-04-30 – 2011-05-01 (×6): 2 via ORAL
  Filled 2011-04-29 (×6): qty 2

## 2011-04-29 SURGICAL SUPPLY — 70 items
BANDAGE ELASTIC 6 VELCRO ST LF (GAUZE/BANDAGES/DRESSINGS) ×1 IMPLANT
BANDAGE ESMARK 6X9 LF (GAUZE/BANDAGES/DRESSINGS) ×1 IMPLANT
BLADE SAGITTAL 25.0X1.19X90 (BLADE) ×2 IMPLANT
BLADE SAW SGTL 11.0X1.19X90.0M (BLADE) IMPLANT
BLADE SAW SGTL 13.0X1.19X90.0M (BLADE) ×2 IMPLANT
BLADE SURG 10 STRL SS (BLADE) ×4 IMPLANT
BNDG CMPR 9X6 STRL LF SNTH (GAUZE/BANDAGES/DRESSINGS) ×1
BNDG CMPR MED 15X6 ELC VLCR LF (GAUZE/BANDAGES/DRESSINGS) ×1
BNDG ELASTIC 6X15 VLCR STRL LF (GAUZE/BANDAGES/DRESSINGS) ×2 IMPLANT
BNDG ESMARK 6X9 LF (GAUZE/BANDAGES/DRESSINGS) ×2
BOWL SMART MIX CTS (DISPOSABLE) ×2 IMPLANT
CEMENT HV SMART SET (Cement) ×3 IMPLANT
CLOTH BEACON ORANGE TIMEOUT ST (SAFETY) ×2 IMPLANT
COVER BACK TABLE 24X17X13 BIG (DRAPES) IMPLANT
COVER PROBE W GEL 5X96 (DRAPES) ×2 IMPLANT
COVER SURGICAL LIGHT HANDLE (MISCELLANEOUS) ×2 IMPLANT
CUFF TOURNIQUET SINGLE 34IN LL (TOURNIQUET CUFF) ×2 IMPLANT
CUFF TOURNIQUET SINGLE 44IN (TOURNIQUET CUFF) IMPLANT
DRAPE EXTREMITY T 121X128X90 (DRAPE) ×2 IMPLANT
DRAPE INCISE IOBAN 66X45 STRL (DRAPES) ×2 IMPLANT
DRAPE PROXIMA HALF (DRAPES) ×2 IMPLANT
DRAPE U-SHAPE 47X51 STRL (DRAPES) ×2 IMPLANT
DRSG ADAPTIC 3X8 NADH LF (GAUZE/BANDAGES/DRESSINGS) ×2 IMPLANT
DRSG PAD ABDOMINAL 8X10 ST (GAUZE/BANDAGES/DRESSINGS) ×3 IMPLANT
DURAPREP 26ML APPLICATOR (WOUND CARE) ×2 IMPLANT
ELECT CAUTERY BLADE 6.4 (BLADE) ×2 IMPLANT
ELECT REM PT RETURN 9FT ADLT (ELECTROSURGICAL) ×2
ELECTRODE REM PT RTRN 9FT ADLT (ELECTROSURGICAL) ×1 IMPLANT
EVACUATOR 1/8 PVC DRAIN (DRAIN) IMPLANT
FACESHIELD LNG OPTICON STERILE (SAFETY) ×2 IMPLANT
GAUZE SPONGE 4X4 12PLY STRL LF (GAUZE/BANDAGES/DRESSINGS) ×1 IMPLANT
GLOVE BIO SURGEON STRL SZ7 (GLOVE) ×2 IMPLANT
GLOVE BIOGEL PI IND STRL 7.0 (GLOVE) ×1 IMPLANT
GLOVE BIOGEL PI IND STRL 7.5 (GLOVE) ×1 IMPLANT
GLOVE BIOGEL PI INDICATOR 7.0 (GLOVE) ×1
GLOVE BIOGEL PI INDICATOR 7.5 (GLOVE) ×1
GLOVE SS BIOGEL STRL SZ 7.5 (GLOVE) ×1 IMPLANT
GLOVE SUPERSENSE BIOGEL SZ 7.5 (GLOVE) ×1
GOWN PREVENTION PLUS XLARGE (GOWN DISPOSABLE) ×4 IMPLANT
GOWN STRL NON-REIN LRG LVL3 (GOWN DISPOSABLE) ×4 IMPLANT
HANDPIECE INTERPULSE COAX TIP (DISPOSABLE) ×2
HOOD PEEL AWAY FACE SHEILD DIS (HOOD) ×4 IMPLANT
IMMOBILIZER KNEE 22 UNIV (SOFTGOODS) IMPLANT
INSERT CUSHION PRONEVIEW LG (MISCELLANEOUS) ×2 IMPLANT
KIT BASIN OR (CUSTOM PROCEDURE TRAY) ×2 IMPLANT
KIT ROOM TURNOVER OR (KITS) ×2 IMPLANT
MANIFOLD NEPTUNE II (INSTRUMENTS) ×2 IMPLANT
NS IRRIG 1000ML POUR BTL (IV SOLUTION) ×2 IMPLANT
PACK TOTAL JOINT (CUSTOM PROCEDURE TRAY) ×2 IMPLANT
PAD ARMBOARD 7.5X6 YLW CONV (MISCELLANEOUS) ×4 IMPLANT
PAD CAST 4YDX4 CTTN HI CHSV (CAST SUPPLIES) ×1 IMPLANT
PADDING CAST COTTON 4X4 STRL (CAST SUPPLIES) ×2
PADDING CAST COTTON 6X4 STRL (CAST SUPPLIES) ×2 IMPLANT
POSITIONER HEAD PRONE TRACH (MISCELLANEOUS) ×2 IMPLANT
RUBBERBAND STERILE (MISCELLANEOUS) ×2 IMPLANT
SET HNDPC FAN SPRY TIP SCT (DISPOSABLE) ×1 IMPLANT
SPONGE GAUZE 4X4 12PLY (GAUZE/BANDAGES/DRESSINGS) ×2 IMPLANT
STRIP CLOSURE SKIN 1/2X4 (GAUZE/BANDAGES/DRESSINGS) ×2 IMPLANT
SUCTION FRAZIER TIP 10 FR DISP (SUCTIONS) ×2 IMPLANT
SUT ETHIBOND NAB CT1 #1 30IN (SUTURE) ×4 IMPLANT
SUT MNCRL AB 3-0 PS2 18 (SUTURE) ×2 IMPLANT
SUT VIC AB 0 CT1 27 (SUTURE) ×4
SUT VIC AB 0 CT1 27XBRD ANBCTR (SUTURE) ×2 IMPLANT
SUT VIC AB 2-0 CT1 27 (SUTURE) ×4
SUT VIC AB 2-0 CT1 TAPERPNT 27 (SUTURE) ×2 IMPLANT
SYR 30ML SLIP (SYRINGE) ×2 IMPLANT
TOWEL OR 17X24 6PK STRL BLUE (TOWEL DISPOSABLE) ×2 IMPLANT
TOWEL OR 17X26 10 PK STRL BLUE (TOWEL DISPOSABLE) ×2 IMPLANT
TRAY FOLEY CATH 14FR (SET/KITS/TRAYS/PACK) ×2 IMPLANT
WATER STERILE IRR 1000ML POUR (IV SOLUTION) ×6 IMPLANT

## 2011-04-29 NOTE — Progress Notes (Signed)
Orthopedic Tech Progress Note Patient Details:  [name] November 14, 1945 [id number]  Patient ID: [name], female   DOB: 1945/11/02, 66 y.o.   MRN: [id number]   Shawnie Pons 04/29/2011, 3:36 PM TRAPEZE Eston Esters

## 2011-04-29 NOTE — Anesthesia Procedure Notes (Addendum)
Anesthesia Regional Block:  Femoral nerve block  Pre-Anesthetic Checklist: ,, timeout performed, Correct Patient, Correct Site, Correct Laterality, Correct Procedure,, site marked, risks and benefits discussed, Surgical consent,  Pre-op evaluation,  At surgeon's request and post-op pain management  Laterality: Left  Prep: chloraprep       Needles:  Injection technique: Single-shot  Needle Type: Echogenic Stimulator Needle     Needle Length: 9cm  Needle Gauge: 21    Additional Needles:  Procedures: nerve stimulator Femoral nerve block  Nerve Stimulator or Paresthesia:  Response: Quadriceps muscle contraction, 0.45 mA,   Additional Responses:   Narrative:  Start time: 04/29/2011 8:55 AM End time: 04/29/2011 9:07 AM Injection made incrementally with aspirations every 5 mL.  Performed by: Personally  Anesthesiologist: Dr Chaney Malling  Additional Notes: Functioning IV was confirmed and monitors were applied.  A 90mm 21ga Arrow echogenic stimulator needle was used. Sterile prep and drape,hand hygiene and sterile gloves were used.  Negative aspiration and negative test dose prior to incremental administration of local anesthetic. The patient tolerated the procedure well.    Femoral nerve block Procedure Name: Intubation Date/Time: 04/29/2011 9:30 AM Performed by: Malachi Pro Pre-anesthesia Checklist: Patient identified, Emergency Drugs available, Suction available, Patient being monitored and Timeout performed Patient Re-evaluated:Patient Re-evaluated prior to inductionOxygen Delivery Method: Circle system utilized Preoxygenation: Pre-oxygenation with 100% oxygen Intubation Type: IV induction Ventilation: Mask ventilation without difficulty Laryngoscope Size: Mac and 3 Grade View: Grade III Tube type: Oral Tube size: 7.5 mm Number of attempts: 1 Airway Equipment and Method: Stylet and Patient positioned with wedge pillow Placement Confirmation: ETT inserted  through vocal cords under direct vision,  positive ETCO2 and breath sounds checked- equal and bilateral Secured at: 22 cm Tube secured with: Tape Dental Injury: Teeth and Oropharynx as per pre-operative assessment

## 2011-04-29 NOTE — Preoperative (Signed)
Beta Blockers   Reason not to administer Beta Blockers:Not Applicable 

## 2011-04-29 NOTE — H&P (View-Only) (Signed)
[name] is an 66 y.o. female.   Chief Complaint: left knee DJD HPI: [name] is a 66 year old seen at the request of Dr. Kramer for significant bilateral knee pain left worse than right. This has been long-standing getting progressively worse. Pain with weightbearing and activity relieved by rest. She has difficulty walking without pain and getting up and starting to walk is very painful and she needs help to stand up. She does not take any medications for this and takes no regular medications. The excruciating pain is getting progressively worse, limiting activities of daily living, poses a significant fall risk, and has failed multiple conservative treatments including intraarticular cortisone injections, intraarticular Supartz, bracing, medication and home physical therapy.  History reviewed. No pertinent past medical history.  Past Surgical History  Procedure Date  . Abdominal hysterectomy     Family History  Problem Relation Age of Onset  . Arthritis Mother   . Hypertension Mother   . Alzheimer's disease Father   . Diabetes Sister   . Hypertension Sister   . Hypertension Brother   . Stroke Brother   . Hypertension Other   . Hypertension Brother   . Hypertension Sister   . Hypertension Sister   . Hypertension Sister    Social History:  reports that she quit smoking about 23 years ago. She has never used smokeless tobacco. She reports that she does not drink alcohol or use illicit drugs.  Allergies: No Known Allergies  Medications Prior to Admission  Medication Sig Dispense Refill  . OVER THE COUNTER MEDICATION Take 2 tablets by mouth every 6 (six) hours as needed. Tylenol for pain       No current facility-administered medications on file as of 04/23/2011.    No results found for this or any previous visit (from the past 48 hour(s)). No results found.  Review of Systems  Constitutional: Negative.   HENT: Negative.  Negative for neck pain.   Eyes: Positive for  blurred vision, double vision, photophobia, pain, discharge and redness.       Large benign cyst under right eye  Respiratory: Negative.   Cardiovascular: Negative.   Gastrointestinal: Negative.   Genitourinary: Negative.   Musculoskeletal: Positive for joint pain. Negative for myalgias, back pain and falls.       Left knee  Skin: Negative.   Neurological: Negative.   Endo/Heme/Allergies: Negative.   Psychiatric/Behavioral: Negative.     Blood pressure 146/73, pulse 88, temperature 98.2 F (36.8 C), temperature source Oral, height 5' 1" (1.549 m), weight 112.038 kg (247 lb), SpO2 100.00%. Physical Exam  Constitutional: She is oriented to person, place, and time. She appears well-developed and well-nourished.  HENT:  Head: Normocephalic and atraumatic.  Mouth/Throat: Oropharynx is clear and moist.  Eyes: Conjunctivae and EOM are normal. Pupils are equal, round, and reactive to light.  Neck: Normal range of motion. Neck supple.  Cardiovascular: Normal rate, regular rhythm and normal heart sounds.   Respiratory: Effort normal and breath sounds normal.  GI: Soft. Bowel sounds are normal.  Genitourinary:       Not pertinent to current symptomatology therefore not examined.  Musculoskeletal:       . Examination of her left knee reveals 1 to 2+ crepitation 1+ synovitis, range of motion 0-120 degrees knee is stable with normal patella tracking mild varus deformity and diffuse pain. Exam of her right knee reveals 1+ crepitation 1+ synovitis range of motion 0-120 degrees knee is stable with diffuse pain and   normal patella tracking. Vascular exam: pulses 1+ and symmetric  Neurological: She is alert and oriented to person, place, and time. She has normal reflexes.  Skin: Skin is warm and dry.  Psychiatric: She has a normal mood and affect. Her behavior is normal. Judgment and thought content normal.    Xray AP,LATERAL, FLEXION, and SUNRISE views show significant joint space narrowing, with  periarticular osteophytes, and subchondral sclerosis.  Assessment Patient Active Problem List  Diagnoses  . Left knee DJD    Plan I talk to her about this in detail. I do feel with the findings on x-rays and significant disability and pain that she is a candidate for left total knee replacement. Discussed risks benefits and possible complications of the surgery in detail and she understands this completely.   She has been evaluated preoperatively by Dr Chan Badger's office and determined to be a low risk for the planned surgery.  Marthann Abshier J 04/23/2011, 3:58 PM    

## 2011-04-29 NOTE — Transfer of Care (Signed)
Immediate Anesthesia Transfer of Care Note  Patient: [name]  Procedure(s) Performed: Procedure(s) (LRB): TOTAL KNEE ARTHROPLASTY (Left)  Patient Location: PACU  Anesthesia Type: General  Level of Consciousness: patient cooperative  Airway & Oxygen Therapy: Patient Spontanous Breathing and Patient connected to face mask  Post-op Assessment: Report given to PACU RN and Post -op Vital signs reviewed and stable  Post vital signs: Reviewed and stable  Complications: No apparent anesthesia complications

## 2011-04-29 NOTE — Brief Op Note (Signed)
04/29/2011  11:32 AM  PATIENT:  [name]  66 y.o. female  PRE-OPERATIVE DIAGNOSIS:  DJD LEFT KNEE  POST-OPERATIVE DIAGNOSIS:  DJD LEFT KNEE  PROCEDURE:  Procedure(s) (LRB): TOTAL KNEE ARTHROPLASTY (Left)  SURGEON:  Surgeon(s) and Role:    * Nilda Simmer, MD - Primary  PHYSICIAN ASSISTANT: Kirstin Shepperson PA-C  ASSISTANTS: Kirstin Shepperson PA-C   ANESTHESIA:   general  EBL:  Total I/O In: 1600 [I.V.:1600] Out: 100 [Urine:100]  BLOOD ADMINISTERED:none  DRAINS: (2) Hemovact drain(s) in the LEFT KNEE with  Suction Clamped   LOCAL MEDICATIONS USED:  NONE  SPECIMEN:  No Specimen  DISPOSITION OF SPECIMEN:  N/A  COUNTS:  YES  TOURNIQUET:   Total Tourniquet Time Documented: Thigh (Left) - 81 minutes  DICTATION: .Note written in EPIC  PLAN OF CARE: Admit to inpatient   PATIENT DISPOSITION:  PACU - hemodynamically stable.   Delay start of Pharmacological VTE agent (>24hrs) due to surgical blood loss or risk of bleeding: no

## 2011-04-29 NOTE — Progress Notes (Signed)
Orthopedic Tech Progress Note Patient Details:  [name] December 31, 1945 [id number]  Other Ortho Devices Type of Ortho Device: Knee Immobilizer Ortho Device Interventions: Ordered   Shawnie Pons 04/29/2011, 3:36 PM

## 2011-04-29 NOTE — Progress Notes (Signed)
Orthopedic Tech Progress Note Patient Details:  [name] [facility] 03-19-1945 [id number]  CPM Left Knee CPM Left Knee: On Left Knee Flexion (Degrees): 30  Left Knee Extension (Degrees): 0  Additional Comments: PT IN PAIN   Cammer, Mickie Bail 04/29/2011, 3:35 PM

## 2011-04-29 NOTE — Interval H&P Note (Signed)
History and Physical Interval Note:  04/29/2011 9:20 AM  [name]  has presented today for surgery, with the diagnosis of DJD LEFT KNEE  The various methods of treatment have been discussed with the patient and family. After consideration of risks, benefits and other options for treatment, the patient has consented to  Procedure(s) (LRB): TOTAL KNEE ARTHROPLASTY (Left) as a surgical intervention .  The patients' history has been reviewed, patient examined, no change in status, stable for surgery.  I have reviewed the patients' chart and labs.  Questions were answered to the patient's satisfaction.     Salvatore Marvel A

## 2011-04-29 NOTE — Anesthesia Preprocedure Evaluation (Addendum)
Anesthesia Evaluation  Patient identified by MRN, date of birth, ID band Patient awake    Reviewed: Allergy & Precautions, H&P , NPO status , Patient's Chart, lab work & pertinent test results  Airway Mallampati: III  Neck ROM: full    Dental  (+) Dental Advisory Given   Pulmonary former smoker         Cardiovascular     Neuro/Psych    GI/Hepatic   Endo/Other  Morbid obesity  Renal/GU      Musculoskeletal   Abdominal   Peds  Hematology   Anesthesia Other Findings   Reproductive/Obstetrics                          Anesthesia Physical Anesthesia Plan  ASA: II  Anesthesia Plan: General and Regional   Post-op Pain Management: MAC Combined w/ Regional for Post-op pain   Induction: Intravenous  Airway Management Planned: Oral ETT  Additional Equipment:   Intra-op Plan:   Post-operative Plan: Extubation in OR  Informed Consent: I have reviewed the patients History and Physical, chart, labs and discussed the procedure including the risks, benefits and alternatives for the proposed anesthesia with the patient or authorized representative who has indicated his/her understanding and acceptance.     Plan Discussed with: Surgeon  Anesthesia Plan Comments:         Anesthesia Quick Evaluation

## 2011-04-29 NOTE — Anesthesia Postprocedure Evaluation (Signed)
Anesthesia Post Note  Patient: [name]  Procedure(s) Performed: Procedure(s) (LRB): TOTAL KNEE ARTHROPLASTY (Left)  Anesthesia type: General  Patient location: PACU  Post pain: Pain level controlled and Adequate analgesia  Post assessment: Post-op Vital signs reviewed, Patient's Cardiovascular Status Stable, Respiratory Function Stable, Patent Airway and Pain level controlled  Last Vitals:  Filed Vitals:   04/29/11 1225  BP:   Pulse:   Temp: 36.2 C  Resp:     Post vital signs: Reviewed and stable  Level of consciousness: awake, alert  and oriented  Complications: No apparent anesthesia complications

## 2011-04-30 LAB — BASIC METABOLIC PANEL
BUN: 7 mg/dL (ref 6–23)
CO2: 24 mEq/L (ref 19–32)
Calcium: 8.3 mg/dL — ABNORMAL LOW (ref 8.4–10.5)
Chloride: 102 mEq/L (ref 96–112)
Creatinine, Ser: 0.55 mg/dL (ref 0.50–1.10)
GFR calc Af Amer: >90 mL/min (ref >90)
GFR calc non Af Amer: >90 mL/min (ref >90)
Glucose, Bld: 125 mg/dL — ABNORMAL HIGH (ref 70–99)
Potassium: 3.8 mEq/L (ref 3.5–5.1)
Sodium: 133 mEq/L — ABNORMAL LOW (ref 135–145)

## 2011-04-30 LAB — CBC
HCT: 29.2 % — ABNORMAL LOW (ref 36.0–46.0)
Hemoglobin: 9.5 g/dL — ABNORMAL LOW (ref 12.0–15.0)
MCH: 27.5 pg (ref 26.0–34.0)
MCHC: 32.5 g/dL (ref 30.0–36.0)
MCV: 84.6 fL (ref 78.0–100.0)
Platelets: 220 10*3/uL (ref 150–400)
RBC: 3.45 MIL/uL — ABNORMAL LOW (ref 3.87–5.11)
RDW: 14 % (ref 11.5–15.5)
WBC: 7.8 10*3/uL (ref 4.0–10.5)

## 2011-04-30 MED ORDER — SODIUM CHLORIDE 0.9 % IV BOLUS (SEPSIS)
500.0000 mL | Freq: Once | INTRAVENOUS | Status: AC
  Administered 2011-04-30: 500 mL via INTRAVENOUS

## 2011-04-30 NOTE — Progress Notes (Signed)
Physical Therapy Note   04/30/11 1456  PT Visit Information  Last PT Received On 04/30/11  Precautions  Precautions Knee  Required Braces or Orthoses Yes  Knee Immobilizer Other (comment) (Until Discontinued)  Restrictions  Weight Bearing Restrictions Yes  LLE Weight Bearing WBAT  Bed Mobility  Bed Mobility Yes  Sit to Supine 1: +2 Total assist;Patient percentage (comment) (pt 70%)  Sit to Supine - Details (indicate cue type and reason) cues and A with Bil LEs.    Scooting to [facility] 1: +2 Total assist;Patient percentage (comment);With trapeze (pt 75%)  Scooting to [facility] Details (indicate cue type and reason) cues to use trapeze and R LE  Transfers  Transfers Yes  Sit to Stand 1: +2 Total assist;Patient percentage (comment);With upper extremity assist;From chair/3-in-1 (pt 60%)  Sit to Stand Details (indicate cue type and reason) cues for UE use, positioning of LEs, using momentum  Stand to Sit 1: +2 Total assist;Patient percentage (comment);With upper extremity assist;To chair/3-in-1 (pt 70%)  Stand to Sit Details cues to get closer to chair/bed, use of UEs, positioning of Bil LEs.    Ambulation/Gait  Ambulation/Gait Yes  Ambulation/Gait Assistance 1: +2 Total assist;Patient percentage (comment) (pt 80%)  Ambulation/Gait Assistance Details (indicate cue type and reason) cues needed for sequencing, use of RW, upright posture.    Ambulation Distance (Feet) 5 Feet  Assistive device Rolling walker  Gait Pattern Step-to pattern;Decreased step length - right;Decreased stance time - left;Trunk flexed  Stairs No  Wheelchair Mobility  Wheelchair Mobility No  Posture/Postural Control  Posture/Postural Control No significant limitations  Balance  Balance Assessed No  PT - End of Session  Equipment Utilized During Treatment Gait belt;Left knee immobilizer  Activity Tolerance Patient limited by fatigue;Patient limited by pain  Patient left in bed;in CPM;with call bell in reach  Nurse  Communication Mobility status for transfers  General  Behavior During Session [facility] for tasks performed  Cognition [facility] for tasks performed  PT - Assessment/Plan  Comments on Treatment Session pt presents s/p TKA.  pt moving slow and requiring +2 A for all mobility.    PT Plan Discharge plan remains appropriate;Frequency remains appropriate  PT Frequency 7X/week  Follow Up Recommendations Home health PT;Supervision/Assistance - 24 hour  Equipment Recommended None recommended by PT  Acute Rehab PT Goals  PT Goal: Sit to Supine/Side - Progress Progressing toward goal  PT Goal: Sit to Stand - Progress Progressing toward goal  PT Goal: Stand to Sit - Progress Progressing toward goal  PT Transfer Goal: Bed to Chair/Chair to Bed - Progress Progressing toward goal  PT Goal: Ambulate - Progress Progressing toward goal    Mack Hook, PT [phone]

## 2011-04-30 NOTE — Evaluation (Signed)
Physical Therapy Evaluation Patient Details Name: [name] MRN: [id number] DOB: 1945-07-15 Today's Date: 04/30/2011  Problem List:  Patient Active Problem List  Diagnoses  . Left knee DJD    Past Medical History:  Past Medical History  Diagnosis Date  . 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 Back pain     arthritis   Past Surgical History:  Past Surgical History  Procedure Date  . Abdominal hysterectomy 2012    PT Assessment/Plan/Recommendation PT Assessment Clinical Impression Statement: 66 y.o. female admitted to [facility] for L TKA.  She presents today with increased pain, decreased left leg strength and ROM, decreased balance and mobility, decreased awareness of knee precautions and exercises.  She would benefit from skilled PT to maximize her independence, functional mobility and safety so that she may return home with her family's 24 hour assist safely at discharge.   PT Recommendation/Assessment: Patient will need skilled PT in the acute care venue PT Problem List: Decreased strength;Decreased range of motion;Decreased activity tolerance;Decreased balance;Decreased mobility;Decreased knowledge of use of DME;Decreased knowledge of precautions;Pain;Obesity PT Therapy Diagnosis : Difficulty walking;Abnormality of gait;Generalized weakness;Acute pain PT Plan PT Frequency: 7X/week PT Treatment/Interventions: DME instruction;Gait training;Functional mobility training;Therapeutic exercise;Therapeutic activities;Balance training;Neuromuscular re-education;Patient/family education PT Recommendation Follow Up Recommendations: Home health PT;Supervision/Assistance - 24 hour Equipment Recommended: None recommended by PT (has RW and 3-in-1 already) PT Goals  Acute Rehab PT Goals PT Goal Formulation: With patient/family Time For Goal Achievement: 7 days Pt will go Supine/Side to Sit: with modified independence;with HOB 0 degrees PT Goal: Supine/Side to Sit - Progress: Goal set today Pt will go  Sit to Supine/Side: with modified independence;with HOB 0 degrees PT Goal: Sit to Supine/Side - Progress: Goal set today Pt will go Sit to Stand: with supervision;with upper extremity assist PT Goal: Sit to Stand - Progress: Goal set today Pt will go Stand to Sit: with supervision PT Goal: Stand to Sit - Progress: Goal set today Pt will Transfer Bed to Chair/Chair to Bed: with supervision PT Transfer Goal: Bed to Chair/Chair to Bed - Progress: Goal set today Pt will Ambulate: 51 - 150 feet;with supervision;with rolling walker PT Goal: Ambulate - Progress: Goal set today Pt will Perform Home Exercise Program: with min assist PT Goal: Perform Home Exercise Program - Progress: Goal set today  PT Evaluation Precautions/Restrictions  Precautions Precautions: Knee Required Braces or Orthoses: Yes Knee Immobilizer:  (until discontinued) Restrictions Weight Bearing Restrictions: No Other Position/Activity Restrictions: WBAT Prior Functioning  Home Living Lives With: Family (brother) Receives Help From: Family (24 hour PRN) Type of Home: House Home Layout: One level Home Access: Stairs to enter;Ramped entrance (ramped entrance) Entrance Stairs-Rails: Right Entrance Stairs-Number of Steps: 6 (3 no handrails, 3 hand rail on right) Bathroom Shower/Tub: Tub/shower unit;[employer]: Standard Home Adaptive Equipment: Walker - rolling;Bedside commode/3-in-1 Prior Function Level of Independence: Independent with basic ADLs;Independent with homemaking with ambulation;Independent with gait;Independent with transfers Able to Take Stairs?: Yes Driving: No Cognition Cognition Arousal/Alertness: Awake/alert Orientation Level: Oriented X4 Sensation/Coordination Sensation Light Touch: Appears Intact Extremity Assessment RLE Strength RLE Overall Strength Comments: grossly WFL LLE PROM (degrees) LLE Overall PROM Comments: 8-35 degrees flexion LLE Strength LLE Overall Strength  Comments: grossly 3/5 at ankle, 2-/5 knee, 2/5 hip Mobility (including Balance) Bed Mobility Bed Mobility: Yes Supine to Sit: 1: +2 Total assist;With rails;HOB elevated (Comment degrees) (HOB 30 degrees) Supine to Sit Details (indicate cue type and reason): Patient 70%, needed assist to get trunk and left leg fully upright  to EOB.  Verbal cues fo rsequencing, 1/2 bridge technique and hand placment.  Sitting - Scoot to Edge of Bed: 3: Mod assist;With rail Sitting - Scoot to [place] of Bed Details (indicate cue type and reason): mod assist to weight shift hips to get to EOB, also, needed assist to support left leg.   Transfers Transfers: Yes Sit to Stand: 1: +2 Total assist;From elevated surface;With upper extremity assist;From bed Sit to Stand Details (indicate cue type and reason): patient 70%, assist needed to boost trunk up over weak legs Stand to Sit: 1: +2 Total assist;With upper extremity assist;To elevated surface;With armrests;To chair/3-in-1 Stand to Sit Details: Patient 80%, verbal cues needed for safe techniique/hand placement, assist needed to help lower trunk slowly and kick left knee out.   Ambulation/Gait Ambulation/Gait: Yes Ambulation/Gait Assistance: 1: +2 Total assist Ambulation/Gait Assistance Details (indicate cue type and reason): patient 80%, assist needed to support trunk for balance, help unweight left left while stepping with the right, and maneuver RW.  Verbal cues needed for foot sequencing.   Ambulation Distance (Feet): 5 Feet Assistive device: Rolling walker Gait Pattern: Antalgic    Exercise  Total Joint Exercises Ankle Circles/Pumps: AROM;Both;10 reps;Seated Quad Sets: AROM;10 reps;Seated;Left Heel Slides: AAROM;10 reps;Left;Seated End of Session PT - End of Session Equipment Utilized During Treatment: Gait belt;Left knee immobilizer Activity Tolerance: Patient limited by pain;Patient limited by fatigue Patient left: in chair;with call bell in reach;with  family/visitor present (son assisting with treatment) Nurse Communication: Mobility status for transfers (RN tech need for 2 person assist) General Behavior During Session: [facility] for tasks performed Cognition: [facility] for tasks performed  Marik Sedore B. Grae Leathers, PT, DPT [phone]  04/30/2011, 10:12 AM

## 2011-04-30 NOTE — Progress Notes (Signed)
UR COMPLETED  

## 2011-04-30 NOTE — Evaluation (Signed)
Occupational Therapy Evaluation Patient Details Name: [name] MRN: [id number] DOB: 1945/06/17 Today's Date: 04/30/2011  Problem List:  Patient Active Problem List  Diagnoses  . Left knee DJD    Past Medical History:  Past Medical History  Diagnosis Date  . 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 Back pain     arthritis   Past Surgical History:  Past Surgical History  Procedure Date  . Abdominal hysterectomy 2012    OT Assessment/Plan/Recommendation OT Assessment Clinical Impression Statement: Pt. presents s/p lt. TKA and with increased pain and below problem list. Pt. will benefit from skilled OT to increase pt. to supervision level at D/C home. OT Recommendation/Assessment: Patient will need skilled OT in the acute care venue OT Problem List: Decreased strength;Decreased activity tolerance;Decreased knowledge of use of DME or AE;Decreased knowledge of precautions;Pain Barriers to Discharge: None OT Therapy Diagnosis : Acute pain OT Plan OT Frequency: Min 2X/week OT Treatment/Interventions: Self-care/ADL training;Therapeutic activities;Patient/family education;Balance training OT Recommendation Follow Up Recommendations: Home health OT;Supervision - Intermittent Equipment Recommended: None recommended by PT Individuals Consulted Consulted and Agree with Results and Recommendations: Patient OT Goals Acute Rehab OT Goals OT Goal Formulation: With patient Time For Goal Achievement: 7 days ADL Goals Pt Will Perform Lower Body Bathing: with set-up;with supervision;Sit to stand from chair ADL Goal: Lower Body Bathing - Progress: Goal set today Pt Will Perform Lower Body Dressing: with set-up;with supervision;Sit to stand from bed;with adaptive equipment ADL Goal: Lower Body Dressing - Progress: Goal set today Pt Will Transfer to Toilet: with supervision;with DME;Ambulation;3-in-1 ADL Goal: Toilet Transfer - Progress: Goal set today Pt Will Perform Tub/Shower Transfer: Shower  transfer;with supervision;with DME;Ambulation ADL Goal: Tub/Shower Transfer - Progress: Goal set today  OT Evaluation Precautions/Restrictions  Precautions Precautions: Knee Required Braces or Orthoses: Yes Knee Immobilizer: Other (comment) (Until Discontinued) Restrictions Weight Bearing Restrictions: Yes LLE Weight Bearing: Weight bearing as tolerated Other Position/Activity Restrictions: WBAT Prior Functioning Home Living Lives With: Family Receives Help From: Family Type of Home: House Home Layout: One level Home Access: Stairs to enter;Ramped entrance Entrance Stairs-Rails: Right Entrance Stairs-Number of Steps: 6 Bathroom Shower/Tub: Forensic scientist: Standard Home Adaptive Equipment: Walker - rolling;Bedside commode/3-in-1 Prior Function Level of Independence: Independent with basic ADLs;Independent with homemaking with ambulation;Independent with gait;Independent with transfers Able to Take Stairs?: Yes Driving: No ADL ADL Eating/Feeding: Performed;Independent Where Assessed - Eating/Feeding: Chair Grooming: Simulated;Wash/dry hands;Set up;Minimal assistance Grooming Details (indicate cue type and reason): Min verbal cues for safety with RW Where Assessed - Grooming: Standing at sink Upper Body Bathing: Simulated;Chest;Right arm;Left arm;Abdomen;Set up Where Assessed - Upper Body Bathing: Sitting, chair Lower Body Bathing: Simulated;Maximal assistance Where Assessed - Lower Body Bathing: Sit to stand from chair Upper Body Dressing: Simulated;Minimal assistance Upper Body Dressing Details (indicate cue type and reason): With donning gown Where Assessed - Upper Body Dressing: Sitting, chair Lower Body Dressing: Simulated;Maximal assistance Where Assessed - Lower Body Dressing: Sit to stand from chair Toilet Transfer: Performed;+2 Total assistance;Comment for patient % (pt=60%) Toilet Transfer Details (indicate cue type and reason): Mod verbal  cues for hand placement and technique Toilet Transfer Method: Stand pivot Toilet Transfer Equipment: Bedside commode Toileting - Clothing Manipulation: Performed;+1 Total assistance Toileting - Clothing Manipulation Details (indicate cue type and reason): with moving gown Where Assessed - Toileting Clothing Manipulation: Sit on 3-in-1 or toilet Toileting - Hygiene: Performed;+1 Total assistance Where Assessed - Toileting Hygiene: Sit to stand from 3-in-1 or toilet ADL Comments: Pt. educated on use of  AE for LB ADLs    Mobility  Bed Mobility Bed Mobility: Yes Supine to Sit: 1: +2 Total assist;With rails;HOB elevated (Comment degrees) Sit to Supine: 1: +2 Total assist;Patient percentage (comment) (pt 70%) Sit to Supine - Details (indicate cue type and reason): cues and A with Bil LEs.   Scooting to [facility]: 1: +2 Total assist;Patient percentage (comment);With trapeze (pt 75%) Scooting to [facility] Details (indicate cue type and reason): cues to use trapeze and R LE Transfers Sit to Stand: 1: +2 Total assist;Patient percentage (comment);With upper extremity assist;From chair/3-in-1 (pt 60%) Sit to Stand Details (indicate cue type and reason): cues for UE use, positioning of LEs, using momentum Stand to Sit: 1: +2 Total assist;Patient percentage (comment);With upper extremity assist;To chair/3-in-1 (pt 70%) Stand to Sit Details: cues to get closer to chair/bed, use of UEs, positioning of Bil LEs.      End of Session OT - End of Session Equipment Utilized During Treatment: Gait belt Activity Tolerance: Patient tolerated treatment well Patient left: in chair;with call bell in reach Nurse Communication: Mobility status for transfers General Behavior During Session: [facility] for tasks performed Cognition: [facility] for tasks performed   Makaio Mach, oTR/L Pager [phone] 04/30/2011, 3:15 PM

## 2011-04-30 NOTE — Progress Notes (Signed)
Clinical Social Work-CSW received referral for [facility]-Reviewed chart and discussed with treatment team-Pt will d/c home with home health-No further needs-[name]-MSW, [phone]

## 2011-04-30 NOTE — Progress Notes (Signed)
Patient ID: [name], female   DOB: [birth year], 66 y.o.   MRN: [id number] PATIENT ID: [name]  MRN: [id number]  DOB/AGE:  09/24/1945 / 66 y.o.  1 Day Post-Op Procedure(s) (LRB): TOTAL KNEE ARTHROPLASTY (Left)    PROGRESS NOTE Subjective: Patient is alert, oriented, yes Nausea, no Vomiting, yes passing gas, no Bowel Movement. Taking PO only grits due to nausea Denies SOB, Chest or Calf Pain. Using Incentive Spirometer, PAS in place. Ambulate not yet, CPM 0=90 Patient reports pain as 5 on 0-10 scale  .    Objective: Vital signs in last 24 hours: Filed Vitals:   04/29/11 1225 04/29/11 2208 04/30/11 0216 04/30/11 0615  BP:  109/65 110/56 135/75  Pulse:  90 84 92  Temp: 97.2 F (36.2 C) 99.5 F (37.5 C) 99.8 F (37.7 C) 98.8 F (37.1 C)  TempSrc:      Resp:  18  18  SpO2:  99% 96% 95%      Intake/Output from previous day: I/O last 3 completed shifts: In: 3500 [P.O.:300; I.V.:3200] Out: 1100 [Urine:1050; Drains:50]   Intake/Output this shift:     LABORATORY DATA:  Basename 04/30/11 0530  WBC 7.8  HGB 9.5*  HCT 29.2*  PLT 220  NA 133*  K 3.8  CL 102  CO2 24  BUN 7  CREATININE 0.55  GLUCOSE 125*  GLUCAP --  INR --  CALCIUM 8.3*    Examination: ABD soft Neurovascular intact Sensation intact distally Intact pulses distally Dorsiflexion/Plantar flexion intact Incision: dressing C/D/I}  Assessment:   1 Day Post-Op Procedure(s) (LRB): TOTAL KNEE ARTHROPLASTY (Left) ADDITIONAL DIAGNOSIS:  Acute Blood Loss Anemia  Plan: PT/OT WBAT, CPM 5/hrs day until ROM 0-90 degrees, then D/C CPM DVT Prophylaxis:  Lovenox DISCHARGE PLAN: Home DISCHARGE NEEDS: HHPT, HHRN, CPM, Walker and 3-in-1 comode seat Fluid bolus times 2 today, dressing change tonight, saline lock IV     Jarl Sellitto J 04/30/2011, 7:57 AM

## 2011-05-01 ENCOUNTER — Encounter (HOSPITAL_COMMUNITY): Payer: Self-pay | Admitting: Orthopedic Surgery

## 2011-05-01 ENCOUNTER — Inpatient Hospital Stay (HOSPITAL_COMMUNITY): Payer: PRIVATE HEALTH INSURANCE

## 2011-05-01 DIAGNOSIS — D62 Acute posthemorrhagic anemia: Secondary | ICD-10-CM

## 2011-05-01 LAB — BASIC METABOLIC PANEL
BUN: 6 mg/dL (ref 6–23)
CO2: 24 mEq/L (ref 19–32)
Calcium: 8.4 mg/dL (ref 8.4–10.5)
Chloride: 101 mEq/L (ref 96–112)
Creatinine, Ser: 0.62 mg/dL (ref 0.50–1.10)
GFR calc Af Amer: >90 mL/min (ref >90)
GFR calc non Af Amer: >90 mL/min (ref >90)
Glucose, Bld: 149 mg/dL — ABNORMAL HIGH (ref 70–99)
Potassium: 3.5 mEq/L (ref 3.5–5.1)
Sodium: 133 mEq/L — ABNORMAL LOW (ref 135–145)

## 2011-05-01 LAB — CBC
HCT: 29.5 % — ABNORMAL LOW (ref 36.0–46.0)
Hemoglobin: 9.6 g/dL — ABNORMAL LOW (ref 12.0–15.0)
MCH: 27.5 pg (ref 26.0–34.0)
MCHC: 32.5 g/dL (ref 30.0–36.0)
MCV: 84.5 fL (ref 78.0–100.0)
Platelets: 189 10*3/uL (ref 150–400)
RBC: 3.49 MIL/uL — ABNORMAL LOW (ref 3.87–5.11)
RDW: 14 % (ref 11.5–15.5)
WBC: 9.8 10*3/uL (ref 4.0–10.5)

## 2011-05-01 MED ORDER — DSS 100 MG PO CAPS: 100.0000 mg | ORAL_CAPSULE | Freq: Two times a day (BID) | ORAL | Status: AC

## 2011-05-01 MED ORDER — MAGNESIUM CITRATE PO SOLN
0.5000 | Freq: Once | ORAL | Status: DC
  Filled 2011-05-01: qty 296

## 2011-05-01 MED ORDER — ENOXAPARIN SODIUM 30 MG/0.3ML ~~LOC~~ SOLN: 30.0000 mg | Freq: Two times a day (BID) | SUBCUTANEOUS | Status: DC

## 2011-05-01 MED ORDER — OXYCODONE-ACETAMINOPHEN 5-325 MG PO TABS: 1.0000 | ORAL_TABLET | ORAL | Status: AC | PRN

## 2011-05-01 MED ORDER — BISACODYL 10 MG RE SUPP
10.0000 mg | Freq: Two times a day (BID) | RECTAL | Status: DC
  Administered 2011-05-01: 10 mg via RECTAL
  Filled 2011-05-01: qty 1

## 2011-05-01 NOTE — Progress Notes (Signed)
PT Treatment Note:   05/01/11 1057  PT Visit Information  Last PT Received On 05/01/11  Precautions  Precautions Knee  Precaution Booklet Issued No  Required Braces or Orthoses Yes  Knee Immobilizer Discontinue post op day 2  Restrictions  Weight Bearing Restrictions Yes  LLE Weight Bearing WBAT  Bed Mobility  Bed Mobility No  Supine to Sit Not tested (comment)  Sitting - Scoot to Edge of Bed Not tested (comment)  Sit to Supine Not Tested (comment)  Scooting to [facility] Not tested (comment)  Transfers  Transfers Yes  Sit to Stand 6: Modified independent (Device/Increase time);With upper extremity assist;From chair/3-in-1 (2 trials.)  Stand to Sit 6: Modified independent (Device/Increase time);With upper extremity assist;To chair/3-in-1 (2 trials.)  Ambulation/Gait  Ambulation/Gait Yes  Ambulation/Gait Assistance 5: Supervision  Ambulation/Gait Assistance Details (indicate cue type and reason) Verbal cues for tall posture and safe sequence for step-through.  Ambulation Distance (Feet) 60 Feet  Assistive device Rolling walker  Gait Pattern Step-through pattern;Decreased step length - left;Decreased stance time - left;Trunk flexed  Stairs No  Stairs Assistance Not tested (comment)  Wheelchair Mobility  Wheelchair Mobility No  Posture/Postural Control  Posture/Postural Control No significant limitations  Balance  Balance Assessed No  Exercises  Exercises Total Joint  Total Joint Exercises  Ankle Circles/Pumps AROM;Left;10 reps;Supine  Quad Sets AROM;Left;10 reps;Supine  Heel Slides AAROM;Left;10 reps;Supine  Short Arc Quad AAROM;Left;10 reps;Supine  Hip ABduction/ADduction AAROM;Left;10 reps;Supine  Straight Leg Raises AAROM;Left;10 reps;Supine  Long Arc [place];Left;10 reps;Seated  Knee Flexion AAROM;Left;10 reps;Seated  PT - End of Session  Equipment Utilized During Treatment Gait belt  Activity Tolerance Patient tolerated treatment well  Patient left in chair;with  call bell in reach  Nurse Communication Mobility status for transfers;Mobility status for ambulation  General  Behavior During Session [facility] for tasks performed  [facility]ognition [facility] for tasks performed  PT - Assessment/Plan  Comments on Treatment Session Pt admitted s/p left TKA and continues to progress by ambulating BID (without KI for second trial).  PT Plan Discharge plan remains appropriate;Frequency remains appropriate  PT Frequency 7X/week  Follow Up Recommendations Home health PT;Supervision/Assistance - 24 hour  Equipment Recommended None recommended by PT  Acute Rehab PT Goals  PT Goal Formulation With patient/family  Time For Goal Achievement 7 days  PT Goal: Sit to Stand - Progress Met  PT Goal: Stand to Sit - Progress Met  PT Goal: Ambulate - Progress Progressing toward goal  PT Goal: Perform Home Exercise Program - Progress Progressing toward goal    Pain- No c/o pain with treatment.  05/01/2011 Cephus Shelling, PT, DPT [phone]

## 2011-05-01 NOTE — Progress Notes (Signed)
Physical Therapy Treatment Patient Details Name: [name] MRN: [id number] DOB: 1945/03/27 Today's Date: 05/01/2011  PT Assessment/Plan  PT - Assessment/Plan Comments on Treatment Session: Pt admitted s/p left TKA and is progressing great!  Pt ready for safe d/c home once medically cleared by MD. PT Plan: Discharge plan remains appropriate;Frequency remains appropriate PT Frequency: 7X/week Follow Up Recommendations: Home health PT;Supervision/Assistance - 24 hour Equipment Recommended: None recommended by PT PT Goals  Acute Rehab PT Goals PT Goal Formulation: With patient/family Time For Goal Achievement: 7 days PT Goal: Sit to Stand - Progress: Met PT Goal: Stand to Sit - Progress: Met PT Goal: Ambulate - Progress: Progressing toward goal  PT Treatment Precautions/Restrictions  Precautions Precautions: Knee Precaution Booklet Issued: No Required Braces or Orthoses: Yes Knee Immobilizer: Discontinue post op day 2 Restrictions Weight Bearing Restrictions: Yes LLE Weight Bearing: Weight bearing as tolerated Other Position/Activity Restrictions: WBAT Mobility (including Balance) Bed Mobility Bed Mobility: No Supine to Sit: Not tested (comment) Sitting - Scoot to Edge of Bed: Not tested (comment) Sit to Supine: Not Tested (comment) Scooting to [facility]: Not tested (comment) Transfers Transfers: Yes Sit to Stand: 5: Supervision;With upper extremity assist;From bed;Other (comment) (From mat; 2 trials.) Sit to Stand Details (indicate cue type and reason): Verbal cues for safest hand placement. Stand to Sit: 5: Supervision;With upper extremity assist;To chair/3-in-1 (2 trials.) Stand to Sit Details: Verbal cues for safest hand and left LE placement. Ambulation/Gait Ambulation/Gait: Yes Ambulation/Gait Assistance: 4: Min assist (Min (guard)) Ambulation/Gait Assistance Details (indicate cue type and reason): Guarding for balance with cues for tall posture as well as sequence for  step-through. Ambulation Distance (Feet): 150 Feet Assistive device: Rolling walker Gait Pattern: Step-through pattern;Decreased step length - left;Decreased stance time - left;Trunk flexed Stairs: Yes Stairs Assistance: 4: Min assist Stairs Assistance Details (indicate cue type and reason): Assist for balance with cues for sequence using "up with good, down with bad." Stair Management Technique: Step to pattern;Backwards;With walker Number of Stairs: 2  Height of Stairs: 8  (inches) Wheelchair Mobility Wheelchair Mobility: No  Posture/Postural Control Posture/Postural Control: No significant limitations Balance Balance Assessed: No End of Session PT - End of Session Equipment Utilized During Treatment: Gait belt;Left knee immobilizer Activity Tolerance: Patient tolerated treatment well Patient left: in chair;with call bell in reach Nurse Communication: Mobility status for transfers;Mobility status for ambulation General Behavior During Session: [facility] for tasks performed Cognition: [facility] for tasks performed  Cephus Shelling 05/01/2011, 9:46 AM  05/01/2011 Cephus Shelling, PT, DPT [phone]

## 2011-05-01 NOTE — Discharge Summary (Signed)
Physician Discharge Summary  Patient ID: [name] MRN: [id number] DOB/AGE: July 23, 1945 66 y.o.  Admit date: 04/29/2011 Discharge date: 05/01/2011  Admission Diagnoses: Past Medical History  Diagnosis Date  . 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 Back pain     arthritis   Discharge Diagnoses:  Principal Problem:  *Left knee DJD Active Problems:  Postoperative anemia due to acute blood loss   Discharged Condition: good  Hospital Course: 04-29-2011  Left total knee by Dr Thurston Hole.  Left femoral nerve block by anesthesia. CPM 0-90 degrees for 6 hrs.  Dilaudid IV and percocet PO for pain control.  04-30-2011  Slow progress with physical therapy.  Passing gas but no bowel movement.  Surgical wound well approximated.  05/01/2011  Patient much more alert.  Ambulated down the hall with physical therapy.  Gave mag citrate to this patient for constipation.  Pain under control with PO pain meds .  Labs stable.  Consults: None  Significant Diagnostic Studies:  Results for orders placed during the hospital encounter of 04/29/11 (from the past 72 hour(s))  CBC     Status: Abnormal   Collection Time   04/30/11  5:30 AM      Component Value Range Comment   WBC 7.8  4.0 - 10.5 (K/uL)    RBC 3.45 (*) 3.87 - 5.11 (MIL/uL)    Hemoglobin 9.5 (*) 12.0 - 15.0 (g/dL)    HCT 40.9 (*) 81.1 - 46.0 (%)    MCV 84.6  78.0 - 100.0 (fL)    MCH 27.5  26.0 - 34.0 (pg)    MCHC 32.5  30.0 - 36.0 (g/dL)    RDW 91.4  78.2 - 95.6 (%)    Platelets 220  150 - 400 (K/uL)   BASIC METABOLIC PANEL     Status: Abnormal   Collection Time   04/30/11  5:30 AM      Component Value Range Comment   Sodium 133 (*) 135 - 145 (mEq/L)    Potassium 3.8  3.5 - 5.1 (mEq/L)    Chloride 102  96 - 112 (mEq/L)    CO2 24  19 - 32 (mEq/L)    Glucose, Bld 125 (*) 70 - 99 (mg/dL)    BUN 7  6 - 23 (mg/dL)    Creatinine, Ser 2.13  0.50 - 1.10 (mg/dL)    Calcium 8.3 (*) 8.4 - 10.5 (mg/dL)    GFR calc non Af Amer >90  >90 (mL/min)    GFR calc Af  Amer >90  >90 (mL/min)   CBC     Status: Abnormal   Collection Time   05/01/11  9:54 AM      Component Value Range Comment   WBC 9.8  4.0 - 10.5 (K/uL)    RBC 3.49 (*) 3.87 - 5.11 (MIL/uL)    Hemoglobin 9.6 (*) 12.0 - 15.0 (g/dL)    HCT 08.6 (*) 57.8 - 46.0 (%)    MCV 84.5  78.0 - 100.0 (fL)    MCH 27.5  26.0 - 34.0 (pg)    MCHC 32.5  30.0 - 36.0 (g/dL)    RDW 46.9  62.9 - 52.8 (%)    Platelets 189  150 - 400 (K/uL)     Treatments: IV hydration, antibiotics: Ancef, analgesia: Dilaudid and percocet, therapies: PT and OT and surgery: left total knee  Discharge Exam: Blood pressure 108/76, pulse 112, temperature 98.7 F (37.1 C), temperature source Oral, resp. rate 20, height 5\' 1"  (1.549 m), weight 112.4  kg (247 lb 12.8 oz), SpO2 94.00%. General appearance: alert, cooperative and appears stated age Pulses: 2+ and symmetric Neurologic: Grossly normal Incision/Wound: well approximated and small amount of drainage  Disposition: 01-Home or Self Care  Discharge Orders    Future Orders Please Complete By Expires   Diet - low sodium heart healthy      Call MD / Call 911      Comments:   If you experience chest pain or shortness of breath, CALL 911 and be transported to the hospital emergency room.  If you develope a fever above 101 F, pus (white drainage) or increased drainage or redness at the wound, or calf pain, call your surgeon's office.   Constipation Prevention      Comments:   Drink plenty of fluids.  Prune juice may be helpful.  You may use a stool softener, such as Colace (over the counter) 100 mg twice a day.  Use MiraLax (over the counter) for constipation as needed.   Increase activity slowly as tolerated      Weight Bearing as taught in Physical Therapy      Comments:   Use a walker or crutches as instructed.   Discharge instructions      Comments:   Total Knee Replacement Care After Refer to this sheet in the next few weeks. These discharge instructions provide you  with general information on caring for yourself after you leave the hospital. Your caregiver may also give you specific instructions. Your treatment has been planned according to the most current medical practices available, but unavoidable complications sometimes occur. If you have any problems or questions after discharge, please call your caregiver. Regaining a near full range of motion of your knee within the first 3 to 6 weeks after surgery is critical. HOME CARE INSTRUCTIONS  You may resume a normal diet and activities as directed. Perform exercises as directed.  You will receive physical therapy as directed by your caregiver.  Take showers instead of baths until informed otherwise.  Change bandages (dressings) if necessary or as directed.  Only take over-the-counter or prescription medicines for pain, discomfort, or fever as directed by your caregiver.  Eat a well-balanced diet.  Avoid lifting or driving until you are instructed otherwise.  Make an appointment to see your caregiver for stitches (suture) or staple removal as directed.  If you have been sent home with a continuous passive motion machine (CPM machine), use as directed.  SEEK MEDICAL CARE IF: You have swelling of your calf or leg.  You develop shortness of breath or chest pain.  You have redness, swelling, or increasing pain in the wound.  There is pus or any unusual drainage coming from the surgical site.  You notice a bad smell coming from the surgical site or dressing.  The surgical site breaks open after sutures or staples have been removed.  There is persistent bleeding from the suture or staple line.  You are getting worse or are not improving.  You have any other questions or concerns.  SEEK IMMEDIATE MEDICAL CARE IF:  You have a fever.  You develop a rash.  You have difficulty breathing.  You develop any reaction or side effects to medicines given.  Your knee motion is decreasing rather than improving.  MAKE  SURE YOU:  Understand these instructions.  Will watch your condition.  Will get help right away if you are not doing well or get worse.  Document Released: 08/24/2004 Document Revised: 10/17/2010 Document Reviewed:  12/07/2008 ExitCare Patient Information 2012 [place], [place].   CPM      Comments:   Continuous passive motion machine (CPM):      Use the CPM from 0 to 90 for 6 hours per day.      You may increase by 10 per day.  You may break it up into 2 or 3 sessions per day.      Use CPM for 2 weeks or until you are told to stop.   TED hose      Comments:   Use stockings (TED hose) for 2 weeks on both leg(s).  You may remove them at night for sleeping.   Change dressing      Comments:   Change the dressing daily with sterile 4 x 4 inch gauze dressing and apply TED hose.  You may clean the incision with alcohol prior to redressing.   Do not put a pillow under the knee. Place it under the heel.      Comments:   Place yellow foam block, yellow side up under heel at all times except when in CPM or when walking.  DO NOT modify, tear, cut, or change in any way the yellow foam block.     Medication List  As of 05/01/2011  1:24 PM   STOP taking these medications         OVER THE COUNTER MEDICATION         TAKE these medications         DSS 100 MG Caps   Take 100 mg by mouth 2 (two) times daily.      enoxaparin 30 MG/0.3ML Soln   Commonly known as: LOVENOX   Inject 0.3 mLs (30 mg total) into the skin every 12 (twelve) hours.      oxyCODONE-acetaminophen 5-325 MG per tablet   Commonly known as: PERCOCET   Take 1-2 tablets by mouth every 4 (four) hours as needed.           Follow-up Information    Follow up with Nilda Simmer, MD on 05/14/2011. (appt time 8:45 am)    Contact information:   [facility] [address], [address] [place] [address] [phone]          Signed: Pascal Lux 05/01/2011, 1:24 PM

## 2011-05-01 NOTE — Op Note (Signed)
NAMEELARIA, OSIAS                ACCOUNT NO.:  [id number]  MEDICAL RECORD NO.:  [id number]  LOCATION:  5035                         FACILITY:  [facility]  PHYSICIAN:  Benney Sommerville A. Thurston Hole, M.D. DATE OF BIRTH:  01/10/1946  DATE OF PROCEDURE:  04/29/2011 DATE OF DISCHARGE:                              OPERATIVE REPORT   PREOPERATIVE DIAGNOSIS:  Left knee degenerative joint disease.  POSTOPERATIVE DIAGNOSIS:  Left knee degenerative joint disease.  PROCEDURE: 1. Left total knee replacement using DePuy cemented total knee system     with #2.5 cemented femur, #3 cemented tibia with 12.5-mm     polyethylene RP tibial spacer, and 32-mm polyethylene cemented     patella. 2. Zinacef impregnated cement.  SURGEON:  Elana Alm. Thurston Hole, MD  ASSISTANT:  Julien Girt, PA-C  ANESTHESIA:  General.  OPERATIVE TIME:  1 hour and 40 minutes.  COMPLICATIONS:  None.  DESCRIPTION OF PROCEDURE:  Ms. Galyon was brought to the operating room on April 29, 2011 after a femoral nerve block was placed in the holding room by Anesthesia.  She was placed on the operative table in supine position.  She received Ancef 2 g IV preoperatively for prophylaxis. After being placed under general anesthesia, she had a Foley catheter placed under sterile conditions.  Her left knee was examined.  Range of motion from -5 to 125 degrees, mild varus deformity, knee stable, ligamentous exam with normal patellar tracking.  Left leg was prepped using sterile DuraPrep and draped using sterile technique.  Time-out procedure was called and the correct left knee identified.  Left leg was exsanguinated and a thigh tourniquet elevated to 375 mm.  Initially, through a 15-cm longitudinal incision based over the patella, initial exposure was made.  The underlying subcutaneous tissues were incised along with skin incision.  A median arthrotomy was performed revealing an excessive amount of normal-appearing joint fluid.  The  articular surfaces were inspected.  She had grade 4 changes medially, laterally, and in the patellofemoral joint.  Osteophytes were removed from the femoral condyles and tibial plateau.  The medial and lateral meniscal remnants were removed as well as the anterior cruciate ligament.  An intramedullary drill was then drilled up the femoral canal for placement of distal femoral cutting jig, which was placed in the appropriate manner of rotation and a distal 10-mm cut was made.  The distal femur was incised.  #2.5 was found be the appropriate size.  #2.5 cutting jig was placed in the appropriate manner of external rotation and then these cuts were made.  The proximal tibia was then exposed.  The tibial spines were removed with an oscillating saw.  Intramedullary drill was drilled down the tibial canal for placement of proximal tibial cutting jig, which was placed in the appropriate manner of rotation and a proximal 4- mm cut was made based off the medial or lower side.  Spacer blocks were then placed in flexion and extension.  12.5-mm blocks gave excellent balancing, excellent stability, and excellent correction of her flexion and varus deformities.  The proximal tibia was then exposed and #3 tibial base plate trial was placed on the cut tibial surface with an excellent fit and  the keel cut was made.  The PCL box cutter was then placed on the distal femur and these cuts were made.  At this point, with a #2.5 femoral trial in place and #3 tibial base plate trial in place, and a 12.5-mm polyethylene tibial spacer, knee was reduced, taken through range of motion from 0-125 degrees with excellent stability and excellent correction of her flexion and varus deformities and normal patellar tracking.  A resurfacing 9-mm cut was then made on the patella and 3 locking holes placed for a 32-mm polyethylene patellar trial. Again, patellofemoral tracking was evaluated and found to be normal.  At this  point, it was felt that all the trial components were of excellent size, fit, and stability.  They were then removed.  The knee was then jet lavage irrigated with 3 L of saline.  The proximal tibia was then exposed, #3 tibial base plate with Zinacef impregnated cement backing was hammered into position with an excellent fit with excess cement being removed from around the edges.  #2.5 femoral component with cement backing was hammered into position also with an excellent fit with excess cement being removed from around the edges.  12.5-mm polyethylene RP tibial spacer was placed on tibial base plate, the knee reduced, taken through range of motion from 0-125 degrees with excellent stability and excellent correction of her flexion and varus deformities. The 32-mm polyethylene cement backed patella was then placed in its position and held there with a clamp.  After the cement hardened, again patellofemoral tracking was evaluated and found to be normal.  At this point, it was felt that all the components were of excellent size, fit, and stability.  The tourniquet was released.  Hemostasis was obtained with cautery.  The arthrotomy was then closed with #1 Ethibond suture over 2 medium Hemovac drains.  Subcutaneous tissues closed with 0 and 2- 0 Vicryl, subcuticular layer closed with 4-0 Monocryl.  Sterile dressings and long-leg splint applied.  The patient was then awakened, extubated, and taken to recovery room in stable condition.  Needle and sponge counts were correct x2 at the end of the case.  Neurovascular status normal.  Pulses 2+ and symmetric.     Berdella Bacot A. Thurston Hole, M.D.     RAW/MEDQ  D:  04/30/2011  T:  05/01/2011  Job:  [id number]

## 2011-05-02 NOTE — Progress Notes (Signed)
CARE MANAGEMENT NOTE 05/02/2011      Action/Plan:   Pateint preoperatively setup with [facility], no changes. Has DME.   Anticipated DC Date:  05/01/2011   Anticipated DC Plan:  HOME W HOME HEALTH SERVICES  In-house referral  NA      DC Planning Services  CM consult      [facility] Choice  HOME HEALTH   Choice offered to / List presented to:  C-1 Patient   DME arranged  NA      DME agency  NA     HH arranged  HH-2 PT      HH agency  [facility]   Status of service:  Completed, signed off   Discharge Disposition:  HOME W HOME HEALTH SERVICES

## 2011-05-22 ENCOUNTER — Ambulatory Visit: Payer: PRIVATE HEALTH INSURANCE | Attending: Orthopedic Surgery | Admitting: Physical Therapy

## 2011-05-22 DIAGNOSIS — R262 Difficulty in walking, not elsewhere classified: Secondary | ICD-10-CM | POA: Insufficient documentation

## 2011-05-22 DIAGNOSIS — IMO0001 Reserved for inherently not codable concepts without codable children: Secondary | ICD-10-CM | POA: Insufficient documentation

## 2011-05-22 DIAGNOSIS — M25669 Stiffness of unspecified knee, not elsewhere classified: Secondary | ICD-10-CM | POA: Insufficient documentation

## 2011-05-22 DIAGNOSIS — M6281 Muscle weakness (generalized): Secondary | ICD-10-CM | POA: Insufficient documentation

## 2011-05-27 ENCOUNTER — Ambulatory Visit: Payer: PRIVATE HEALTH INSURANCE | Admitting: Physical Therapy

## 2011-05-30 ENCOUNTER — Ambulatory Visit: Payer: PRIVATE HEALTH INSURANCE | Admitting: Rehabilitative and Restorative Service Providers"

## 2011-06-04 ENCOUNTER — Ambulatory Visit: Payer: PRIVATE HEALTH INSURANCE | Admitting: Physical Therapy

## 2011-06-06 ENCOUNTER — Ambulatory Visit: Payer: PRIVATE HEALTH INSURANCE | Admitting: Physical Therapy

## 2011-06-10 ENCOUNTER — Ambulatory Visit: Payer: PRIVATE HEALTH INSURANCE | Admitting: Physical Therapy

## 2011-06-12 ENCOUNTER — Ambulatory Visit: Payer: PRIVATE HEALTH INSURANCE | Admitting: Physical Therapy

## 2011-06-19 ENCOUNTER — Ambulatory Visit: Payer: PRIVATE HEALTH INSURANCE | Attending: Orthopedic Surgery | Admitting: Physical Therapy

## 2011-06-19 DIAGNOSIS — M25669 Stiffness of unspecified knee, not elsewhere classified: Secondary | ICD-10-CM | POA: Insufficient documentation

## 2011-06-19 DIAGNOSIS — M6281 Muscle weakness (generalized): Secondary | ICD-10-CM | POA: Insufficient documentation

## 2011-06-19 DIAGNOSIS — IMO0001 Reserved for inherently not codable concepts without codable children: Secondary | ICD-10-CM | POA: Insufficient documentation

## 2011-06-19 DIAGNOSIS — R262 Difficulty in walking, not elsewhere classified: Secondary | ICD-10-CM | POA: Insufficient documentation

## 2011-06-20 ENCOUNTER — Ambulatory Visit: Payer: PRIVATE HEALTH INSURANCE | Admitting: Physical Therapy

## 2011-06-24 ENCOUNTER — Ambulatory Visit: Payer: PRIVATE HEALTH INSURANCE | Admitting: Physical Therapy

## 2011-06-26 ENCOUNTER — Ambulatory Visit: Payer: PRIVATE HEALTH INSURANCE | Admitting: Physical Therapy

## 2011-07-01 ENCOUNTER — Ambulatory Visit: Payer: PRIVATE HEALTH INSURANCE | Admitting: Physical Therapy

## 2011-07-04 ENCOUNTER — Ambulatory Visit: Payer: PRIVATE HEALTH INSURANCE | Admitting: Physical Therapy

## 2011-07-08 ENCOUNTER — Ambulatory Visit: Payer: PRIVATE HEALTH INSURANCE | Admitting: Physical Therapy

## 2011-07-10 ENCOUNTER — Ambulatory Visit: Payer: PRIVATE HEALTH INSURANCE | Admitting: Physical Therapy

## 2011-07-17 ENCOUNTER — Ambulatory Visit: Payer: PRIVATE HEALTH INSURANCE | Admitting: Physical Therapy

## 2011-07-22 ENCOUNTER — Ambulatory Visit: Payer: PRIVATE HEALTH INSURANCE | Attending: Orthopedic Surgery | Admitting: Physical Therapy

## 2011-07-22 DIAGNOSIS — IMO0001 Reserved for inherently not codable concepts without codable children: Secondary | ICD-10-CM | POA: Insufficient documentation

## 2011-07-22 DIAGNOSIS — M6281 Muscle weakness (generalized): Secondary | ICD-10-CM | POA: Insufficient documentation

## 2011-07-22 DIAGNOSIS — M25669 Stiffness of unspecified knee, not elsewhere classified: Secondary | ICD-10-CM | POA: Insufficient documentation

## 2011-07-22 DIAGNOSIS — R262 Difficulty in walking, not elsewhere classified: Secondary | ICD-10-CM | POA: Insufficient documentation

## 2011-07-24 ENCOUNTER — Ambulatory Visit: Payer: PRIVATE HEALTH INSURANCE | Admitting: Physical Therapy

## 2011-07-29 ENCOUNTER — Ambulatory Visit: Payer: PRIVATE HEALTH INSURANCE

## 2011-08-01 ENCOUNTER — Ambulatory Visit: Payer: PRIVATE HEALTH INSURANCE

## 2011-08-05 ENCOUNTER — Ambulatory Visit: Payer: PRIVATE HEALTH INSURANCE | Admitting: Physical Therapy

## 2011-08-07 ENCOUNTER — Ambulatory Visit: Payer: PRIVATE HEALTH INSURANCE

## 2011-08-13 ENCOUNTER — Ambulatory Visit: Payer: PRIVATE HEALTH INSURANCE

## 2011-08-15 ENCOUNTER — Encounter (HOSPITAL_COMMUNITY): Payer: Self-pay | Admitting: Emergency Medicine

## 2011-08-15 ENCOUNTER — Ambulatory Visit: Payer: PRIVATE HEALTH INSURANCE

## 2011-08-15 ENCOUNTER — Emergency Department (HOSPITAL_COMMUNITY)
Admission: EM | Admit: 2011-08-15 | Discharge: 2011-08-15 | Disposition: A | Discharge status: Home / Self Care | Payer: PRIVATE HEALTH INSURANCE | Attending: Emergency Medicine | Admitting: Emergency Medicine

## 2011-08-15 ENCOUNTER — Emergency Department (HOSPITAL_COMMUNITY): Payer: PRIVATE HEALTH INSURANCE

## 2011-08-15 DIAGNOSIS — M1710 Unilateral primary osteoarthritis, unspecified knee: Secondary | ICD-10-CM | POA: Insufficient documentation

## 2011-08-15 DIAGNOSIS — Z96659 Presence of unspecified artificial knee joint: Secondary | ICD-10-CM | POA: Insufficient documentation

## 2011-08-15 DIAGNOSIS — R030 Elevated blood-pressure reading, without diagnosis of hypertension: Secondary | ICD-10-CM | POA: Insufficient documentation

## 2011-08-15 DIAGNOSIS — E876 Hypokalemia: Secondary | ICD-10-CM | POA: Insufficient documentation

## 2011-08-15 DIAGNOSIS — R42 Dizziness and giddiness: Secondary | ICD-10-CM | POA: Insufficient documentation

## 2011-08-15 LAB — COMPREHENSIVE METABOLIC PANEL
ALT: 6 U/L (ref 0–35)
AST: 11 U/L (ref 0–37)
Albumin: 3.6 g/dL (ref 3.5–5.2)
Alkaline Phosphatase: 100 U/L (ref 39–117)
BUN: 8 mg/dL (ref 6–23)
CO2: 23 mEq/L (ref 19–32)
Calcium: 9.4 mg/dL (ref 8.4–10.5)
Chloride: 103 mEq/L (ref 96–112)
Creatinine, Ser: 0.65 mg/dL (ref 0.50–1.10)
GFR calc Af Amer: >90 mL/min (ref >90)
GFR calc non Af Amer: >90 mL/min (ref >90)
Glucose, Bld: 88 mg/dL (ref 70–99)
Potassium: 3.1 mEq/L — ABNORMAL LOW (ref 3.5–5.1)
Sodium: 137 mEq/L (ref 135–145)
Total Bilirubin: 0.3 mg/dL (ref 0.3–1.2)
Total Protein: 8.1 g/dL (ref 6.0–8.3)

## 2011-08-15 LAB — CBC WITH DIFFERENTIAL/PLATELET
Basophils Absolute: 0 10*3/uL (ref 0.0–0.1)
Basophils Relative: 0 % (ref 0–1)
Eosinophils Absolute: 0.1 10*3/uL (ref 0.0–0.7)
Eosinophils Relative: 1 % (ref 0–5)
HCT: 33.2 % — ABNORMAL LOW (ref 36.0–46.0)
Hemoglobin: 10.3 g/dL — ABNORMAL LOW (ref 12.0–15.0)
Lymphocytes Relative: 44 % (ref 12–46)
Lymphs Abs: 2.4 10*3/uL (ref 0.7–4.0)
MCH: 24.9 pg — ABNORMAL LOW (ref 26.0–34.0)
MCHC: 31 g/dL (ref 30.0–36.0)
MCV: 80.2 fL (ref 78.0–100.0)
Monocytes Absolute: 0.4 10*3/uL (ref 0.1–1.0)
Monocytes Relative: 7 % (ref 3–12)
Neutro Abs: 2.6 10*3/uL (ref 1.7–7.7)
Neutrophils Relative %: 47 % (ref 43–77)
Platelets: 345 10*3/uL (ref 150–400)
RBC: 4.14 MIL/uL (ref 3.87–5.11)
RDW: 15.4 % (ref 11.5–15.5)
WBC: 5.4 10*3/uL (ref 4.0–10.5)

## 2011-08-15 LAB — URINALYSIS, ROUTINE W REFLEX MICROSCOPIC
Bilirubin Urine: NEGATIVE
Glucose, UA: NEGATIVE mg/dL
Ketones, ur: NEGATIVE mg/dL
Nitrite: NEGATIVE
Protein, ur: NEGATIVE mg/dL
Specific Gravity, Urine: 1.011 (ref 1.005–1.030)
Urobilinogen, UA: 1 mg/dL (ref 0.0–1.0)
pH: 7.5 (ref 5.0–8.0)

## 2011-08-15 LAB — URINE MICROSCOPIC-ADD ON

## 2011-08-15 MED ORDER — POTASSIUM CHLORIDE CRYS ER 20 MEQ PO TBCR: 20.0000 meq | EXTENDED_RELEASE_TABLET | Freq: Two times a day (BID) | ORAL | Status: DC

## 2011-08-15 NOTE — ED Notes (Signed)
Per EMS- picked up [facility] while at physical therapy.  Pt was doing exercises and started feeling dizzy and reported a BP of 190/100.  Denies pain, pt alert and oriented.  No cardiac or htn hx

## 2011-08-15 NOTE — ED Notes (Signed)
Patient transported to X-ray 

## 2011-08-15 NOTE — ED Notes (Signed)
PT. Was gowned ,put on monitor,vitals sign were taken

## 2011-08-15 NOTE — ED Provider Notes (Signed)
History     CSN: [id number]  Arrival date & time 08/15/11  1606   First MD Initiated Contact with Patient 08/15/11 1615      Chief Complaint  Patient presents with  . Dizziness     HPI Per EMS- picked up [facility] while at physical therapy. Pt was doing exercises and started feeling dizzy and reported a BP of 190/100. Denies pain, pt alert and oriented. No cardiac or htn hx.  Patient has complaints of lower extremity swelling bilaterally that has been present for many years.  She says it's usually worse in the summer which is the case now.  Past Medical History  Diagnosis Date  . 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 Back pain     arthritis    Past Surgical History  Procedure Date  . Abdominal hysterectomy 2012  . Total knee arthroplasty 04/29/2011    Procedure: TOTAL KNEE ARTHROPLASTY;  Surgeon: Nilda Simmer, MD;  Location: [facility] OR;  Service: Orthopedics;  Laterality: Left;  DR Thurston Hole WANTS 90 MINUTES FOR THIS CASE    Family History  Problem Relation Age of Onset  . Arthritis Mother   . Hypertension Mother   . Alzheimer's disease Father   . Diabetes Sister   . Hypertension Sister   . Hypertension Brother   . Stroke Brother   . Hypertension Other   . Hypertension Brother   . Hypertension Sister   . Hypertension Sister   . Hypertension Sister   . Anesthesia problems Neg Hx   . Hypotension Neg Hx   . Malignant hyperthermia Neg Hx   . Pseudochol deficiency Neg Hx     History  Substance Use Topics  . Smoking status: Former Smoker    Quit date: 04/22/1988  . Smokeless tobacco: Never Used  . Alcohol Use: No    OB History    Grav Para Term Preterm Abortions TAB SAB Ect Mult Living                  Review of Systems  All other systems reviewed and are negative.    Allergies  Review of patient's allergies indicates no known allergies.  Home Medications   Current Outpatient Rx  Name Route Sig Dispense Refill  . DEXLANSOPRAZOLE 60 MG PO CPDR Oral  Take 60 mg by mouth 2 (two) times daily.    Marland Kitchen HYDROCODONE-ACETAMINOPHEN 5-325 MG PO TABS Oral Take 1 tablet by mouth every 6 (six) hours as needed. pain    . POTASSIUM CHLORIDE CRYS ER 20 MEQ PO TBCR Oral Take 1 tablet (20 mEq total) by mouth 2 (two) times daily. 14 tablet 0    BP 143/61  Pulse 81  Temp 98.5 F (36.9 C)  Resp 15  SpO2 100%  Physical Exam  Nursing note and vitals reviewed. Constitutional: She is oriented to person, place, and time. She appears well-developed and well-nourished. No distress.  HENT:  Head: Normocephalic and atraumatic.  Eyes: Pupils are equal, round, and reactive to light.  Neck: Normal range of motion.  Cardiovascular: Normal rate and intact distal pulses.        Normal sinus rhythm Rate = 82 Right bundle branch block (old) Left ventricular hypertrophy (old) No significant change from previous EKG of March 2013  Pulmonary/Chest: No respiratory distress. She has no rales.  Abdominal: Normal appearance. She exhibits no distension.  Musculoskeletal: Normal range of motion.       Right ankle: She exhibits swelling.  Left ankle: She exhibits swelling.       Right lower leg: She exhibits edema.       Left lower leg: She exhibits edema.  Neurological: She is alert and oriented to person, place, and time. No cranial nerve deficit.  Skin: Skin is warm and dry. No rash noted.  Psychiatric: She has a normal mood and affect. Her behavior is normal.    ED Course  Procedures (including critical care time)  Labs Reviewed  COMPREHENSIVE METABOLIC PANEL - Abnormal; Notable for the following:    Potassium 3.1 (*)     All other components within normal limits  URINALYSIS, ROUTINE W REFLEX MICROSCOPIC - Abnormal; Notable for the following:    Hgb urine dipstick TRACE (*)     Leukocytes, UA SMALL (*)     All other components within normal limits  CBC WITH DIFFERENTIAL - Abnormal; Notable for the following:    Hemoglobin 10.3 (*)     HCT 33.2 (*)      MCH 24.9 (*)     All other components within normal limits  URINE MICROSCOPIC-ADD ON - Abnormal; Notable for the following:    Squamous Epithelial / LPF FEW (*)     All other components within normal limits   Dg Chest 2 View  08/15/2011  *RADIOLOGY REPORT*  Clinical Data: Cough, shortness of breath, dizziness, former smoker, hypertension  CHEST - 2 VIEW  Comparison: 05/01/2011  Findings: Upper-normal size of cardiac silhouette. Mediastinal contours and pulmonary vascularity normal. Chronic peribronchial thickening. Improved right basilar atelectasis. Minimal atelectasis left base, also improved. No definite acute infiltrate, pleural effusion or pneumothorax. Scattered end plate spurs thoracic spine.  IMPRESSION: Bronchitic changes with improved bibasilar atelectasis.  Original Report Authenticated By: Lollie Marrow, M.D.     1. Dizziness   2. Transient hypertension   3. Hypokalemia       MDM          Nelia Shi, MD 08/15/11 [phone]

## 2011-08-15 NOTE — ED Notes (Signed)
ZOX:[id number]<AV> Expected date:<BR> Expected time:<BR> Means of arrival:<BR> Comments:<BR> htn/dizziness

## 2011-08-15 NOTE — Discharge Instructions (Signed)
Foods Rich in Potassium The body needs potassium to:  Control blood pressure.   Keep the muscles healthy.   Keep the nervous system healthy.  Most foods contain potassium. Eating a variety of foods in the right amounts will help control the level of potassium in your body.  Food / Potassium (mg)  Apricots, dried,  cup / 378 mg   Apricots, raw, 1 cup halves / 401 mg   Avocado,  / 487 mg   Banana, 1 large / 487 mg   Beef, lean, round, 3 oz / 202 mg   Cantaloupe, 1 cup cubes / 427 mg   Dates, medjool, 5 whole / 835 mg   Ham, cured, 3 oz / 212 mg   Lentils, dried,  cup / 458 mg   Lima beans, frozen,  cup / 258 mg   Orange, 1 large / 333 mg   Orange juice, 1 cup / 443 mg   Peaches, dried,  cup / 398 mg   Peas, split, cooked,  cup / 355 mg   Potato, boiled, 1 medium / 515 mg   Prunes, dried, uncooked,  cup / 318 mg   Raisins,  cup / 309 mg   Salmon, pink, raw, 3 oz / 275 mg   Sardines, canned , 3 oz / 338 mg   Tomato, raw, 1 medium / 292 mg   Tomato juice, 6 oz / 417 mg   [place], 3 oz / 349 mg  Other Foods High in Potassium (greater than 250 mg):  Bran cereals and other bran products.   Milk (skim, 1%, 2%, whole).   Buttermilk.   Yogurt.   Nuts.   Dried fruits.   Cherries.   Sweet potatoes.   Oranges.   Baked Beans.   Broccoli.   Spinach.   Peanut butter.   Tofu.  Foods Lower in Potassium (less than 250 mg):  Pasta.   Rice.   Cottage cheese.   Cheddar cheese.   Apples.   Mango.   Grapes.   Grapefruit.   Pineapple.   Raspberries.   Strawberries.   Watermelon.   Green Beans.   Cabbage.   Carrots.   Cauliflower.   Celery.   Corn.    Mushrooms.   Onions.   Squash.   Eggs.  The list below tells you how big or small some common portion sizes are:  1 oz.........4 stacked dice.   3 oz........Marland KitchenDeck of cards.   1 tsp.......Marland KitchenTip of little finger.   1 tbs......Marland KitchenMarland KitchenThumb.   2 tbs.......Marland KitchenGolf  ball.    cup......Marland KitchenHalf of a fist.   1 cup.......Marland KitchenA fist.  Document Released: 07/24/2007 Document Revised: 10/17/2010 Document Reviewed: 06/20/2008 [facility] Patient Information 2012 [place], [place].Dizziness Dizziness is a common problem. It is a feeling of unsteadiness or lightheadedness. You may feel like you are about to faint. Dizziness can lead to injury if you stumble or fall. A person of any age group can suffer from dizziness, but dizziness is more common in older adults. CAUSES  Dizziness can be caused by many different things, including:  Middle ear problems.   Standing for too long.   Infections.   An allergic reaction.   Aging.   An emotional response to something, such as the sight of blood.   Side effects of medicines.   Fatigue.   Problems with circulation or blood pressure.   Excess use of alcohol, medicines, or illegal drug use.   Breathing too fast (hyperventilation).   An arrhythmia or  problems with your heart rhythm.   Low red blood cell count (anemia).   Pregnancy.   Vomiting, diarrhea, fever, or other illnesses that cause dehydration.   Diseases or conditions such as Parkinson's disease, high blood pressure (hypertension), diabetes, and thyroid problems.   Exposure to extreme heat.  DIAGNOSIS  To find the cause of your dizziness, your caregiver may do a physical exam, lab tests, radiologic imaging scans, or an electrocardiography test (ECG).  TREATMENT  Treatment of dizziness depends on the cause of your symptoms and can vary greatly. HOME CARE INSTRUCTIONS   Drink enough fluids to keep your urine clear or pale yellow. This is especially important in very hot weather. In the elderly, it is also important in cold weather.   If your dizziness is caused by medicines, take them exactly as directed. When taking blood pressure medicines, it is especially important to get up slowly.   Rise slowly from chairs and steady yourself until you feel  okay.   In the morning, first sit up on the side of the bed. When this seems okay, stand slowly while holding onto something until you know your balance is fine.   If you need to stand in one place for a long time, be sure to move your legs often. Tighten and relax the muscles in your legs while standing.   If dizziness continues to be a problem, have someone stay with you for a day or two. Do this until you feel you are well enough to stay alone. Have the person call your caregiver if he or she notices changes in you that are concerning.   Do not drive or use heavy machinery if you feel dizzy.  SEEK IMMEDIATE MEDICAL CARE IF:   Your dizziness or lightheadedness gets worse.   You feel nauseous or vomit.   You develop problems with talking, walking, weakness, or using your arms, hands, or legs.   You are not thinking clearly or you have difficulty forming sentences. It may take a friend or family member to determine if your thinking is normal.   You develop chest pain, abdominal pain, shortness of breath, or sweating.   Your vision changes.   You notice any bleeding.   You have side effects from medicine that seems to be getting worse rather than better.  MAKE SURE YOU:   Understand these instructions.   Will watch your condition.   Will get help right away if you are not doing well or get worse.  Document Released: 07/31/2000 Document Revised: 01/24/2011 Document Reviewed: 08/24/2010 [facility] Patient Information 2012 [place], [place].

## 2011-08-20 ENCOUNTER — Ambulatory Visit: Payer: PRIVATE HEALTH INSURANCE | Attending: Orthopedic Surgery

## 2011-08-20 DIAGNOSIS — M6281 Muscle weakness (generalized): Secondary | ICD-10-CM | POA: Insufficient documentation

## 2011-08-20 DIAGNOSIS — IMO0001 Reserved for inherently not codable concepts without codable children: Secondary | ICD-10-CM | POA: Insufficient documentation

## 2011-08-20 DIAGNOSIS — M25669 Stiffness of unspecified knee, not elsewhere classified: Secondary | ICD-10-CM | POA: Insufficient documentation

## 2011-08-20 DIAGNOSIS — R262 Difficulty in walking, not elsewhere classified: Secondary | ICD-10-CM | POA: Insufficient documentation

## 2011-08-23 ENCOUNTER — Ambulatory Visit: Payer: PRIVATE HEALTH INSURANCE | Admitting: Physical Therapy

## 2011-10-11 ENCOUNTER — Other Ambulatory Visit (INDEPENDENT_AMBULATORY_CARE_PROVIDER_SITE_OTHER): Payer: PRIVATE HEALTH INSURANCE

## 2011-10-11 ENCOUNTER — Ambulatory Visit (INDEPENDENT_AMBULATORY_CARE_PROVIDER_SITE_OTHER): Payer: PRIVATE HEALTH INSURANCE | Admitting: Gastroenterology

## 2011-10-11 ENCOUNTER — Encounter: Payer: Self-pay | Admitting: Gastroenterology

## 2011-10-11 VITALS — BP 132/74 | HR 68 | Ht 61.0 in | Wt 230.0 lb

## 2011-10-11 DIAGNOSIS — K219 Gastro-esophageal reflux disease without esophagitis: Secondary | ICD-10-CM

## 2011-10-11 DIAGNOSIS — D649 Anemia, unspecified: Secondary | ICD-10-CM

## 2011-10-11 DIAGNOSIS — Z1211 Encounter for screening for malignant neoplasm of colon: Secondary | ICD-10-CM

## 2011-10-11 DIAGNOSIS — Z79899 Other long term (current) drug therapy: Secondary | ICD-10-CM

## 2011-10-11 LAB — BASIC METABOLIC PANEL
BUN: 12 mg/dL (ref 6–23)
CO2: 25 mEq/L (ref 19–32)
Calcium: 9.3 mg/dL (ref 8.4–10.5)
Chloride: 106 mEq/L (ref 96–112)
Creatinine, Ser: 0.7 mg/dL (ref 0.4–1.2)
GFR: 105.72 mL/min (ref >60.00)
Glucose, Bld: 87 mg/dL (ref 70–99)
Potassium: 3.9 mEq/L (ref 3.5–5.1)
Sodium: 138 mEq/L (ref 135–145)

## 2011-10-11 LAB — HEPATIC FUNCTION PANEL
ALT: 10 U/L (ref 0–35)
AST: 14 U/L (ref 0–37)
Albumin: 3.8 g/dL (ref 3.5–5.2)
Alkaline Phosphatase: 93 U/L (ref 39–117)
Bilirubin, Direct: 0.1 mg/dL (ref 0.0–0.3)
Total Bilirubin: 0.6 mg/dL (ref 0.3–1.2)
Total Protein: 7.8 g/dL (ref 6.0–8.3)

## 2011-10-11 LAB — CBC WITH DIFFERENTIAL/PLATELET
Basophils Absolute: 0 10*3/uL (ref 0.0–0.1)
Basophils Relative: 0.3 % (ref 0.0–3.0)
Eosinophils Absolute: 0.1 10*3/uL (ref 0.0–0.7)
Eosinophils Relative: 1.6 % (ref 0.0–5.0)
HCT: 34.3 % — ABNORMAL LOW (ref 36.0–46.0)
Hemoglobin: 10.9 g/dL — ABNORMAL LOW (ref 12.0–15.0)
Lymphocytes Relative: 33.3 % (ref 12.0–46.0)
Lymphs Abs: 1.5 10*3/uL (ref 0.7–4.0)
MCHC: 31.8 g/dL (ref 30.0–36.0)
MCV: 81.7 fl (ref 78.0–100.0)
Monocytes Absolute: 0.6 10*3/uL (ref 0.1–1.0)
Monocytes Relative: 13.4 % — ABNORMAL HIGH (ref 3.0–12.0)
Neutro Abs: 2.4 10*3/uL (ref 1.4–7.7)
Neutrophils Relative %: 51.4 % (ref 43.0–77.0)
Platelets: 288 10*3/uL (ref 150.0–400.0)
RBC: 4.2 Mil/uL (ref 3.87–5.11)
RDW: 19.9 % — ABNORMAL HIGH (ref 11.5–14.6)
WBC: 4.6 10*3/uL (ref 4.5–10.5)

## 2011-10-11 LAB — FERRITIN: Ferritin: 22.8 ng/mL (ref 10.0–291.0)

## 2011-10-11 LAB — VITAMIN B12: Vitamin B-12: 426 pg/mL (ref 211–911)

## 2011-10-11 LAB — TSH: TSH: 1.34 u[IU]/mL (ref 0.35–5.50)

## 2011-10-11 LAB — IBC PANEL
Iron: 35 ug/dL — ABNORMAL LOW (ref 42–145)
Saturation Ratios: 11.7 % — ABNORMAL LOW (ref 20.0–50.0)
Transferrin: 214.2 mg/dL (ref 212.0–360.0)

## 2011-10-11 LAB — FOLATE: Folate: 8.6 ng/mL (ref >5.9)

## 2011-10-11 MED ORDER — PEG-KCL-NACL-NASULF-NA ASC-C 100 G PO SOLR: 1.0000 | Freq: Once | ORAL | Status: DC

## 2011-10-11 NOTE — Progress Notes (Signed)
History of Present Illness:  This is a very pleasant 66 year old African American female who underwent knee replacement in March. Subsequentially she has been found to be anemic with a hemoglobin of can, otherwise normal blood counts. She denies any GI complaints except for several months of acid reflux managed with daily Dexilant 60 mg. She denies dysphagia for foods but does have trouble swallowing pills. She has mild chronic constipation but denies melena or hematochezia. The patient not had previous barium studies or endoscopic exams. Family history is entirely noncontributory. She does use when necessary ibuprofen for pain. On review her record, other problems or idiopathic hypokalemia. She is on potassium replacement therapy, but is not on any diuretics. She has had previous hysterectomy.  I have reviewed this patient's present history, medical and surgical past history, allergies and medications.     ROS: The remainder of the 10 point ROS is negative     Physical Exam: Blood pressure 132/74, pulse 60 and regular, weight 230 pounds and BMI of 43.46. General well developed well nourished patient in no acute distress, appearing their stated age Eyes PERRLA, no icterus, fundoscopic exam per opthamologist Skin no lesions noted Neck supple, no adenopathy, no thyroid enlargement, no tenderness Chest clear to percussion and auscultation Heart no significant murmurs, gallops or rubs noted Abdomen no hepatosplenomegaly masses or tenderness, BS normal.  Rectal inspection normal no fissures, or fistulae noted.  No masses or tenderness on digital exam. Stool guaiac negative. Extremities no acute joint lesions, edema, phlebitis or evidence of cellulitis. Neurologic patient oriented x 3, cranial nerves intact, no focal neurologic deficits noted. Psychological mental status normal and normal affect.  Assessment and plan: Anemia probably related to recent blood loss with knee replacement therapy.  Because of her age, she does need screening colonoscopy which has been scheduled. We also will perform diagnostic endoscopy because of her reflux symptoms and history of NSAID use. Have asked her to continue Dexilant until her workup is been completed. Repeat CBC and anemia profile ordered. Encounter Diagnoses  Name Primary?  Marland Kitchen Anemia Yes  . GERD (gastroesophageal reflux disease)   . Special screening for malignant neoplasms, colon

## 2011-10-11 NOTE — Patient Instructions (Addendum)
Your physician has requested that you go to the basement for the following lab work before leaving today: You have been scheduled for an endoscopy and colonoscopy with propofol. Please follow the written instructions given to you at your visit today. Please pick up your prep at the pharmacy within the next 1-3 days. If you use inhalers (even only as needed), please bring them with you on the day of your procedure. CC: Elizabeth Palau, M.D.

## 2011-10-11 NOTE — Addendum Note (Signed)
Addended by: Venora Maples on: 10/11/2011 10:15 AM   Modules accepted: Orders

## 2011-10-14 ENCOUNTER — Other Ambulatory Visit: Payer: Self-pay | Admitting: *Deleted

## 2011-10-14 DIAGNOSIS — D509 Iron deficiency anemia, unspecified: Secondary | ICD-10-CM

## 2011-10-14 MED ORDER — INTEGRA 62.5-62.5-40-3 MG PO CAPS: ORAL_CAPSULE | ORAL | Status: DC

## 2011-10-18 ENCOUNTER — Ambulatory Visit (AMBULATORY_SURGERY_CENTER): Payer: PRIVATE HEALTH INSURANCE | Admitting: Gastroenterology

## 2011-10-18 ENCOUNTER — Encounter: Payer: Self-pay | Admitting: Gastroenterology

## 2011-10-18 VITALS — BP 144/57 | HR 86 | Temp 96.7°F | Resp 13 | Ht 61.0 in | Wt 230.0 lb

## 2011-10-18 DIAGNOSIS — D649 Anemia, unspecified: Secondary | ICD-10-CM

## 2011-10-18 DIAGNOSIS — K5730 Diverticulosis of large intestine without perforation or abscess without bleeding: Secondary | ICD-10-CM

## 2011-10-18 DIAGNOSIS — Z1211 Encounter for screening for malignant neoplasm of colon: Secondary | ICD-10-CM

## 2011-10-18 DIAGNOSIS — D126 Benign neoplasm of colon, unspecified: Secondary | ICD-10-CM

## 2011-10-18 DIAGNOSIS — K219 Gastro-esophageal reflux disease without esophagitis: Secondary | ICD-10-CM

## 2011-10-18 MED ORDER — SODIUM CHLORIDE 0.9 % IV SOLN: 500.0000 mL | INTRAVENOUS | Status: DC

## 2011-10-18 NOTE — Progress Notes (Signed)
Patient states that her lower front teeth are loose.   CRNA aware of situation.  Patient is well aware that the EGD could damage her teeth or have them fall out.

## 2011-10-18 NOTE — Op Note (Signed)
[facility] [address] [place], [address]   COLONOSCOPY PROCEDURE REPORT  PATIENT: [name], [name]  MR#: [id number] BIRTHDATE: 08-27-1945 , 66  yrs. old GENDER: Female ENDOSCOPIST: Mardella Layman, MD, [facility] REFERRED BY:  Elizabeth Palau, F.N.P.-B.C. PROCEDURE DATE:  10/18/2011 PROCEDURE:   Colonoscopy with snare polypectomy ASA CLASS:   Class III INDICATIONS:average risk patient for colon cancer and iron deficiency anemia. MEDICATIONS: Propofol (Diprivan) 240 mg IV  DESCRIPTION OF PROCEDURE:   After the risks and benefits and of the procedure were explained, informed consent was obtained.  A digital rectal exam revealed no abnormalities of the rectum and A digital rectal exam revealed external hemorrhoids.    The LB [id number]  endoscope was introduced through the anus and advanced to the cecum, which was identified by both the appendix and ileocecal valve .  The quality of the prep was good, using MoviPrep .  The instrument was then slowly withdrawn as the colon was fully examined.     COLON FINDINGS: Mild diverticulosis was noted at the cecum and in the ascending colon.   There was severe diverticulosis noted in the descending colon and sigmoid colon with associated muscular hypertrophy.   A sessile polyp measuring 1 cm in size with a friable surface was found in the rectum.  A polypectomy was performed using snare cautery.  The resection was complete and the polyp tissue was completely retrieved.     Retroflexed views revealed no abnormalities.     The scope was then withdrawn from the patient and the procedure completed.  COMPLICATIONS: There were no complications. ENDOSCOPIC IMPRESSION: 1.   Mild diverticulosis was noted at the cecum and in the ascending colon 2.   There was severe diverticulosis noted in the descending colon and sigmoid colon 3.   Sessile polyp measuring 1 cm in size was found in the rectum; polypectomy was performed  using snare cautery 4.endoscopy not done per very loose teeth  RECOMMENDATIONS: 1.  await pathology results 2.  Repeat colonoscopy in 5 years if polyp adenomatous; otherwise 10 years 3.  High fiber diet   REPEAT EXAM:  cc:  _______________________________ eSignedMardella Layman, MD, [facility] 10/18/2011 3:20 PM     PATIENT NAME:  [name], [name] MR#: [id number]

## 2011-10-18 NOTE — Patient Instructions (Addendum)

## 2011-10-18 NOTE — Progress Notes (Signed)
Patient did not experience any of the following events: a burn prior to discharge; a fall within the facility; wrong site/side/patient/procedure/implant event; or a hospital transfer or hospital admission upon discharge from the facility. (G8907) Patient did not have preoperative order for IV antibiotic SSI prophylaxis. (G8918)  

## 2011-10-22 ENCOUNTER — Telehealth: Payer: Self-pay | Admitting: *Deleted

## 2011-10-22 DIAGNOSIS — Z9289 Personal history of other medical treatment: Secondary | ICD-10-CM | POA: Insufficient documentation

## 2011-10-22 NOTE — Telephone Encounter (Signed)
  Follow up Call-  Call back number 10/18/2011  Post procedure Call Back phone  # [phone] or [phone]  Permission to leave phone message Yes     Patient questions:  Do you have a fever, pain , or abdominal swelling? no Pain Score  0 *  Have you tolerated food without any problems? yes  Have you been able to return to your normal activities? yes  Do you have any questions about your discharge instructions: Diet   no Medications  no Follow up visit  no  Do you have questions or concerns about your Care? no  Actions: * If pain score is 4 or above: No action needed, pain <4.

## 2011-10-24 ENCOUNTER — Ambulatory Visit (HOSPITAL_BASED_OUTPATIENT_CLINIC_OR_DEPARTMENT_OTHER): Payer: PRIVATE HEALTH INSURANCE | Attending: Cardiology

## 2011-10-24 VITALS — Ht 64.0 in | Wt 230.0 lb

## 2011-10-24 DIAGNOSIS — G4733 Obstructive sleep apnea (adult) (pediatric): Secondary | ICD-10-CM

## 2011-10-25 ENCOUNTER — Encounter: Payer: Self-pay | Admitting: Gastroenterology

## 2011-11-02 DIAGNOSIS — G4733 Obstructive sleep apnea (adult) (pediatric): Secondary | ICD-10-CM

## 2011-11-03 NOTE — Procedures (Signed)
NAME:  [name], [name]                ACCOUNT NO.:  [id number]  MEDICAL RECORD NO.:  [id number]          PATIENT TYPE:  OUT  LOCATION:  SLEEP CENTER                 FACILITY:  [facility]  PHYSICIAN:  Kween Bacorn D. Maple Hudson, MD, FCCP, FACPDATE OF BIRTH:  11-28-[birth year]  DATE OF STUDY:  10/24/2011                           NOCTURNAL POLYSOMNOGRAM  REFERRING PHYSICIAN:  Pamella Pert, MD  INDICATION FOR STUDY:  Hypersomnia with sleep apnea.  EPWORTH SLEEPINESS SCORE:  5/24.  BMI 39.5, weight 230 pounds, height 64 inches, neck 13 inches.  MEDICATIONS:  Home medications are charted and reviewed.  SLEEP ARCHITECTURE:  Total sleep time 264.5 minutes with sleep efficiency 69.2%.  Stage I at 5.1%, stage II 66.7%, stage III absent. REM 28.2% of total sleep time.  Sleep latency 18 minutes.  REM latency 116.5 minutes, awake after sleep onset 99 minutes, arousal index 7.3.  Bedtime Medication:  None.  RESPIRATORY DATA:  Apnea-hypopnea index (AHI) 10.9 per hour.  A total of 48 events was scored including 7 obstructive apneas and 41 hypopneas. Events were more common while nonsupine and in REM.  REM/AHI 29.8 per hour.  She could not maintain sleep adequately to meet criteria for applying split protocol CPAP titration on the study night.  OXYGEN DATA:  Very loud snoring with oxygen desaturation to a nadir of 83% and mean oxygen saturation through the study of 94.4% on room air.  CARDIAC DATA:  Sinus rhythm with occasional PVC.  MOVEMENT-PARASOMNIA:  No significant movement disturbance.  Bathroom x1.  IMPRESSIONS-RECOMMENDATIONS: 1. Sustained sleep was delayed until around 12:30 a.m. without sleep     medication. 2. Mild obstructive sleep apnea/hypopnea syndrome, AHI 10.9 per hour     with events in all sleep positions.  Very loud snoring with oxygen     desaturation to a nadir of 83% and mean oxygen saturation through     the study of 94.4% on room air. 3. She did not meet protocol requirements for  initiation of split     protocol, CPAP titration on the study night.  Scores in this range     would usually be addressed first with conservative therapy     including weight loss if     appropriate.  If desired,she can return for a     dedicated CPAP titration study or alternative management as     clinically indicated.     Bilan Tedesco D. Maple Hudson, MD, [facility], FACP Diplomate, American Board of Sleep Medicine    CDY/MEDQ  D:  11/02/2011 09:03:12  T:  11/03/2011 03:46:36  Job:  [id number]

## 2011-11-21 ENCOUNTER — Other Ambulatory Visit: Payer: Self-pay | Admitting: Dermatology

## 2012-01-10 ENCOUNTER — Telehealth: Payer: Self-pay | Admitting: *Deleted

## 2012-01-10 NOTE — Telephone Encounter (Signed)
Informed pt she needs labs; she stated understanding and will come Monday.

## 2012-01-10 NOTE — Telephone Encounter (Signed)
Message copied by Florene Glen on Fri Jan 10, 2012  1:05 PM ------      Message from: Daphine Deutscher      Created: Mon Oct 14, 2011  3:25 PM       Call and remind due for cbc, iron studies for DP on 01/13/12. Labs in EPIC.

## 2012-01-13 ENCOUNTER — Other Ambulatory Visit (INDEPENDENT_AMBULATORY_CARE_PROVIDER_SITE_OTHER): Payer: PRIVATE HEALTH INSURANCE

## 2012-01-13 DIAGNOSIS — D509 Iron deficiency anemia, unspecified: Secondary | ICD-10-CM

## 2012-01-13 LAB — IBC PANEL
Iron: 47 ug/dL (ref 42–145)
Saturation Ratios: 14.7 % — ABNORMAL LOW (ref 20.0–50.0)
Transferrin: 228.4 mg/dL (ref 212.0–360.0)

## 2012-01-13 LAB — CBC WITH DIFFERENTIAL/PLATELET
Basophils Absolute: 0 10*3/uL (ref 0.0–0.1)
Basophils Relative: 0.5 % (ref 0.0–3.0)
Eosinophils Absolute: 0.1 10*3/uL (ref 0.0–0.7)
Eosinophils Relative: 1.7 % (ref 0.0–5.0)
HCT: 35.5 % — ABNORMAL LOW (ref 36.0–46.0)
Hemoglobin: 11.6 g/dL — ABNORMAL LOW (ref 12.0–15.0)
Lymphocytes Relative: 41.8 % (ref 12.0–46.0)
Lymphs Abs: 2 10*3/uL (ref 0.7–4.0)
MCHC: 32.6 g/dL (ref 30.0–36.0)
MCV: 83 fl (ref 78.0–100.0)
Monocytes Absolute: 0.6 10*3/uL (ref 0.1–1.0)
Monocytes Relative: 12 % (ref 3.0–12.0)
Neutro Abs: 2.1 10*3/uL (ref 1.4–7.7)
Neutrophils Relative %: 44 % (ref 43.0–77.0)
Platelets: 303 10*3/uL (ref 150.0–400.0)
RBC: 4.28 Mil/uL (ref 3.87–5.11)
RDW: 15 % — ABNORMAL HIGH (ref 11.5–14.6)
WBC: 4.8 10*3/uL (ref 4.5–10.5)

## 2012-10-13 ENCOUNTER — Other Ambulatory Visit: Payer: Self-pay

## 2013-01-22 ENCOUNTER — Encounter: Payer: Self-pay | Admitting: Advanced Practice Midwife

## 2013-01-27 ENCOUNTER — Encounter: Payer: Self-pay | Admitting: *Deleted

## 2013-02-02 ENCOUNTER — Ambulatory Visit (INDEPENDENT_AMBULATORY_CARE_PROVIDER_SITE_OTHER): Payer: PRIVATE HEALTH INSURANCE | Admitting: Gastroenterology

## 2013-02-02 ENCOUNTER — Encounter: Payer: Self-pay | Admitting: Gastroenterology

## 2013-02-02 VITALS — BP 128/80 | HR 76 | Ht 61.0 in | Wt 253.7 lb

## 2013-02-02 DIAGNOSIS — K219 Gastro-esophageal reflux disease without esophagitis: Secondary | ICD-10-CM

## 2013-02-02 DIAGNOSIS — R110 Nausea: Secondary | ICD-10-CM

## 2013-02-02 MED ORDER — DEXLANSOPRAZOLE 60 MG PO CPDR: 60.0000 mg | DELAYED_RELEASE_CAPSULE | Freq: Every day | ORAL | Status: DC

## 2013-02-02 NOTE — Patient Instructions (Addendum)
Samples of Dexilant given today, please take one capsule by mouth once daily.   You watched a movie on acid reflux today and information is below for your review.  You have been scheduled for an abdominal ultrasound at [facility] (1st floor of hospital) on 02-05-13 at 8 am. Please arrive 15 minutes prior to your appointment for registration. Make certain not to have anything to eat or drink 6 hours prior to your appointment. Should you need to reschedule your appointment, please contact radiology at [phone]. This test typically takes about 30 minutes to perform. __________________________________________________________________________________________________________________________________________________________________________  Gastroesophageal Reflux Disease, Adult Gastroesophageal reflux disease (GERD) happens when acid from your stomach flows up into the esophagus. When acid comes in contact with the esophagus, the acid causes soreness (inflammation) in the esophagus. Over time, GERD may create small holes (ulcers) in the lining of the esophagus. CAUSES   Increased body weight. This puts pressure on the stomach, making acid rise from the stomach into the esophagus.  Smoking. This increases acid production in the stomach.  Drinking alcohol. This causes decreased pressure in the lower esophageal sphincter (valve or ring of muscle between the esophagus and stomach), allowing acid from the stomach into the esophagus.  Late evening meals and a full stomach. This increases pressure and acid production in the stomach.  A malformed lower esophageal sphincter. Sometimes, no cause is found. SYMPTOMS   Burning pain in the lower part of the mid-chest behind the breastbone and in the mid-stomach area. This may occur twice a week or more often.  Trouble swallowing.  Sore throat.  Dry cough.  Asthma-like symptoms including chest tightness, shortness of breath, or  wheezing. DIAGNOSIS  Your caregiver may be able to diagnose GERD based on your symptoms. In some cases, X-rays and other tests may be done to check for complications or to check the condition of your stomach and esophagus. TREATMENT  Your caregiver may recommend over-the-counter or prescription medicines to help decrease acid production. Ask your caregiver before starting or adding any new medicines.  HOME CARE INSTRUCTIONS   Change the factors that you can control. Ask your caregiver for guidance concerning weight loss, quitting smoking, and alcohol consumption.  Avoid foods and drinks that make your symptoms worse, such as:  Caffeine or alcoholic drinks.  Chocolate.  Peppermint or mint flavorings.  Garlic and onions.  Spicy foods.  Citrus fruits, such as oranges, lemons, or limes.  Tomato-based foods such as sauce, chili, salsa, and pizza.  Fried and fatty foods.  Avoid lying down for the 3 hours prior to your bedtime or prior to taking a nap.  Eat small, frequent meals instead of large meals.  Wear loose-fitting clothing. Do not wear anything tight around your waist that causes pressure on your stomach.  Raise the head of your bed 6 to 8 inches with wood blocks to help you sleep. Extra pillows will not help.  Only take over-the-counter or prescription medicines for pain, discomfort, or fever as directed by your caregiver.  Do not take aspirin, ibuprofen, or other nonsteroidal anti-inflammatory drugs (NSAIDs). SEEK IMMEDIATE MEDICAL CARE IF:   You have pain in your arms, neck, jaw, teeth, or back.  Your pain increases or changes in intensity or duration.  You develop nausea, vomiting, or sweating (diaphoresis).  You develop shortness of breath, or you faint.  Your vomit is green, yellow, black, or looks like coffee grounds or blood.  Your stool is red, bloody, or black. These symptoms could be  signs of other problems, such as heart disease, gastric bleeding, or  esophageal bleeding. MAKE SURE YOU:   Understand these instructions.  Will watch your condition.  Will get help right away if you are not doing well or get worse. Document Released: 11/14/2004 Document Revised: 04/29/2011 Document Reviewed: 08/24/2010 [facility] Patient Information 2014 [place], [place].

## 2013-02-02 NOTE — Progress Notes (Signed)
This is a very nice 67 year old African American female who is previously had acid reflux controlled by PPI therapy.  She had a knee replacement year ago has had worsening GI complaints is that time.  She complains mostly of nausea and epigastric abdominal pain without true reflux symptoms or dysphagia.  She denies abuse of alcohol, cigarettes, or NSAIDs.  Review of her record shows no previous ultrasound exams or hepatobiliary problems.  One year ago I did colonoscopy removal of an adenomatous polyp.  She's had rather marked weight gain since her knee replacement surgery which probably is contributing to her acid  Current Medications, Allergies, Past Medical History, Past Surgical History, Family History and Social History were reviewed in [employer] record.  ROS: All systems were reviewed and are negative unless otherwise stated in the HPI.          Physical Exam: Blood pressure 120/80, pulse 76 and regular and weight 253 with a BMI of 47.96.  Cannot appreciate stigmata of chronic liver disease.  Her chest is clear, and she is in a regular rhythm without murmurs gallops or rubs.  Her abdomen shows exogenous obesity without definite organomegaly, masses or localized tenderness.  Bowel sounds are normal.  Mental status is normal    Assessment and Plan: Probable recurrent acid reflux, rule out cholelithiasis.  I placed her back on Dexilant 60 mg a day with standard antireflux maneuvers.  Also will check gallbladder ultrasound.  She continues to be symptomatic we will consider endoscopy and exam for H. pylori.  Reflux may be shown to patient for edification.

## 2013-02-05 ENCOUNTER — Ambulatory Visit (HOSPITAL_COMMUNITY)
Admission: RE | Admit: 2013-02-05 | Discharge: 2013-02-05 | Disposition: A | Discharge status: Home / Self Care | Payer: PRIVATE HEALTH INSURANCE | Source: Ambulatory Visit | Attending: Gastroenterology | Admitting: Gastroenterology

## 2013-02-05 DIAGNOSIS — R110 Nausea: Secondary | ICD-10-CM

## 2013-02-05 DIAGNOSIS — R109 Unspecified abdominal pain: Secondary | ICD-10-CM | POA: Insufficient documentation

## 2013-02-05 DIAGNOSIS — K219 Gastro-esophageal reflux disease without esophagitis: Secondary | ICD-10-CM

## 2013-02-08 ENCOUNTER — Ambulatory Visit: Payer: Self-pay | Admitting: Advanced Practice Midwife

## 2013-02-09 ENCOUNTER — Telehealth: Payer: Self-pay | Admitting: *Deleted

## 2013-02-09 NOTE — Telephone Encounter (Signed)
Called both numbers again w/o success. Mailed pt a letter with results and a request she call us back.

## 2013-02-09 NOTE — Telephone Encounter (Signed)
Message copied by Florene Glen on Tue Feb 09, 2013  4:06 PM ------      Message from: Jarold Motto, DAVID R      Created: Fri Feb 05, 2013 10:01 AM       Please call...she has gallstones and her nauseas probably from stones,surgery consult warranted,also continue PPI med ------

## 2013-02-10 ENCOUNTER — Telehealth: Payer: Self-pay | Admitting: *Deleted

## 2013-02-10 DIAGNOSIS — R933 Abnormal findings on diagnostic imaging of other parts of digestive tract: Secondary | ICD-10-CM

## 2013-02-10 DIAGNOSIS — K8020 Calculus of gallbladder without cholecystitis without obstruction: Secondary | ICD-10-CM

## 2013-02-10 NOTE — Telephone Encounter (Signed)
Unable to leave a message pt's phone and I mailed her a letter yesterday to call us. She noticed our call on her phone ID this am and called. Informed her of u/s results and she may need surgery. Informed pt of appt with Dr Gaynelle Adu on 02/20/12 at 2:15, arrive at 1:45pm; pt stated understanding.

## 2013-02-19 ENCOUNTER — Ambulatory Visit (INDEPENDENT_AMBULATORY_CARE_PROVIDER_SITE_OTHER): Payer: PRIVATE HEALTH INSURANCE | Admitting: General Surgery

## 2013-02-19 ENCOUNTER — Encounter (INDEPENDENT_AMBULATORY_CARE_PROVIDER_SITE_OTHER): Payer: Self-pay | Admitting: General Surgery

## 2013-02-19 VITALS — BP 140/84 | HR 92 | Temp 97.9°F | Resp 14 | Ht 62.0 in | Wt 254.0 lb

## 2013-02-19 DIAGNOSIS — K8020 Calculus of gallbladder without cholecystitis without obstruction: Secondary | ICD-10-CM

## 2013-02-19 NOTE — Progress Notes (Signed)
Patient ID: [name], female   DOB: 12-29-1945, 68 y.o.   MRN: [id number]  Chief Complaint  Patient presents with  . New Evaluation    eval GB    HPI [name] is a 68 y.o. female.   HPI 68 year old morbidly obese African American female referred by Dr. Sharlett Iles for evaluation of abdominal pain. The patient has a history of acid reflux and initially thought her symptoms were due to acid reflux however they were not improving so she followed up with Dr. Sharlett Iles. She states over the past several months she has had intermittent right-sided abdominal pain that lasts anywhere from minutes to one hour. It is associated with nausea. She denies any fevers or chills. She denies any regurgitation. She denies any heavy NSAID use. She denies any jaundice or acholic stools. She denies any diarrhea or constipation. She has noticed that if she eat greasy foods it tends to exacerbate the discomfort on the right side. She denies any weight loss. Past Medical History  Diagnosis Date  . 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 Arthritis     Back   . Anemia   . GERD (gastroesophageal reflux disease)   . Colon polyp     Tubular Adenoma     Past Surgical History  Procedure Laterality Date  . Abdominal hysterectomy  2012  . Total knee arthroplasty  04/29/2011    Procedure: TOTAL KNEE ARTHROPLASTY;  Surgeon: Lorn Junes, MD;  Location: [facility];  Service: Orthopedics;  Laterality: Left;  [place] THIS CASE  . Colonoscopy w/ biopsies      Family History  Problem Relation Age of Onset  . Arthritis Mother   . Hypertension Mother   . Alzheimer's disease Father   . Diabetes Sister   . Hypertension Sister   . Hypertension Brother   . Stroke Brother   . Hypertension Other   . Hypertension Brother   . Hypertension Sister   . Hypertension Sister   . Hypertension Sister   . Anesthesia problems Neg Hx   . Hypotension Neg Hx   . Malignant hyperthermia Neg Hx   . Pseudochol deficiency Neg Hx    . Colon cancer Neg Hx     Social History History  Substance Use Topics  . Smoking status: Former Smoker    Quit date: 04/22/1988  . Smokeless tobacco: Never Used  . Alcohol Use: No    No Known Allergies  Current Outpatient Prescriptions  Medication Sig Dispense Refill  . acetaminophen (TYLENOL) 325 MG tablet Take 325 mg by mouth as needed.       Marland Kitchen dexlansoprazole (DEXILANT) 60 MG capsule Take 1 capsule (60 mg total) by mouth daily.  10 capsule  0  . Ibuprofen (MOTRIN PO) Take by mouth as needed.       No current facility-administered medications for this visit.    Review of Systems Review of Systems  Constitutional: Negative for fever, activity change, appetite change and unexpected weight change.  HENT: Negative for nosebleeds and trouble swallowing.   Eyes: Negative for photophobia and visual disturbance.  Respiratory: Negative for chest tightness and shortness of breath.   Cardiovascular: Negative for chest pain and leg swelling.       Denies CP, SOB, orthopnea, PND, DOE  Gastrointestinal:       See HPI  Genitourinary: Negative for dysuria and difficulty urinating.       Vag hysterectomy  Musculoskeletal: Negative for arthralgias.  Skin: Negative for pallor  and rash.  Neurological: Negative for dizziness, seizures, facial asymmetry and numbness.       Denies TIA and amaurosis fugax   Hematological: Negative for adenopathy. Does not bruise/bleed easily.  Psychiatric/Behavioral: Negative for behavioral problems and agitation.    Blood pressure 140/84, pulse 92, temperature 97.9 F (36.6 C), temperature source Temporal, resp. rate 14, height 5\' 2"  (1.575 m), weight 254 lb (115.214 kg).  Physical Exam Physical Exam  Vitals reviewed. Constitutional: She is oriented to person, place, and time. She appears well-developed and well-nourished. No distress.  Morbidly obese  HENT:  Head: Normocephalic and atraumatic.  Right Ear: External ear normal.  Left Ear:  External ear normal.  Eyes: Conjunctivae are normal. No scleral icterus.  Neck: Normal range of motion. Neck supple. No tracheal deviation present. No thyromegaly present.  Cardiovascular: Normal rate and normal heart sounds.   Pulmonary/Chest: Effort normal and breath sounds normal. No stridor. No respiratory distress. She has no wheezes.  Abdominal: Soft. She exhibits no distension. There is no tenderness. There is no rebound and no guarding.    Rt mid abdomen scar  Musculoskeletal: She exhibits no edema and no tenderness.  Lymphadenopathy:    She has no cervical adenopathy.  Neurological: She is alert and oriented to person, place, and time. She exhibits normal muscle tone.  Skin: Skin is warm and dry. No rash noted. She is not diaphoretic. No erythema.  Psychiatric: She has a normal mood and affect. Her behavior is normal. Judgment and thought content normal.    Data Reviewed Dr Buel Ream note abd u/s - gallstone, fatty liver  Assessment    Symptomatic cholelithiasis     Plan    I believe the patient's symptoms are consistent with gallbladder disease.  We discussed gallbladder disease. The patient was given Neurosurgeon. We discussed non-operative and operative management. We discussed the signs & symptoms of acute cholecystitis  I discussed laparoscopic cholecystectomy with IOC in detail.  The patient was given educational material as well as diagrams detailing the procedure.  We discussed the risks and benefits of a laparoscopic cholecystectomy including, but not limited to bleeding, infection, injury to surrounding structures such as the intestine or liver, bile leak, retained gallstones, need to convert to an open procedure, prolonged diarrhea, blood clots such as  DVT, common bile duct injury, anesthesia risks, and possible need for additional procedures.  We discussed the typical post-operative recovery course. I explained that the likelihood of improvement of  their symptoms is good.  She needs to discuss timing of surgery with her family and she was instructed to call our office to schedule surgery once she has figured this out  [employer] Redmond Pulling, MD, FACS General, Bariatric, & Minimally Invasive Surgery [facility], [place]         [facility] M 02/19/2013, 2:51 PM

## 2013-02-19 NOTE — Patient Instructions (Signed)
Call our office at [phone] when you are ready to schedule surgery. Please call at least 3-4 weeks prior to your desired surgery date  Cholelithiasis Cholelithiasis (also called gallstones) is a form of gallbladder disease in which gallstones form in your gallbladder. The gallbladder is an organ that stores bile made in the liver, which helps digest fats. Gallstones begin as small crystals and slowly grow into stones. Gallstone pain occurs when the gallbladder spasms and a gallstone is blocking the duct. Pain can also occur when a stone passes out of the duct.  RISK FACTORS  Being female.   Having multiple pregnancies. Health care providers sometimes advise removing diseased gallbladders before future pregnancies.   Being obese.  Eating a diet heavy in fried foods and fat.   Being older than 67 years and increasing age.   Prolonged use of medicines containing female hormones.   Having diabetes mellitus.   Rapidly losing weight.   Having a family history of gallstones (heredity).  SYMPTOMS  Nausea.   Vomiting.  Abdominal pain.   Yellowing of the skin (jaundice).   Sudden pain. It may persist from several minutes to several hours.  Fever.   Tenderness to the touch. In some cases, when gallstones do not move into the bile duct, people have no pain or symptoms. These are called "silent" gallstones.  TREATMENT Silent gallstones do not need treatment. In severe cases, emergency surgery may be required. Options for treatment include:  Surgery to remove the gallbladder. This is the most common treatment.  Medicines. These do not always work and may take 6 12 months or more to work.  Shock wave treatment (extracorporeal biliary lithotripsy). In this treatment an ultrasound machine sends shock waves to the gallbladder to break gallstones into smaller pieces that can pass into the intestines or be dissolved by medicine. HOME CARE INSTRUCTIONS   Only take  over-the-counter or prescription medicines for pain, discomfort, or fever as directed by your health care provider.   Follow a low-fat diet until seen again by your health care provider. Fat causes the gallbladder to contract, which can result in pain.   Follow up with your health care provider as directed. Attacks are almost always recurrent and surgery is usually required for permanent treatment.  SEEK IMMEDIATE MEDICAL CARE IF:   Your pain increases and is not controlled by medicines.   You have a fever or persistent symptoms for more than 2 3 days.   You have a fever and your symptoms suddenly get worse.   You have persistent nausea and vomiting.  MAKE SURE YOU:   Understand these instructions.  Will watch your condition.  Will get help right away if you are not doing well or get worse. Document Released: 01/31/2005 Document Revised: 10/07/2012 Document Reviewed: 07/29/2012 [facility] Patient Information 2014 [place].

## 2013-02-22 ENCOUNTER — Telehealth (INDEPENDENT_AMBULATORY_CARE_PROVIDER_SITE_OTHER): Payer: Self-pay | Admitting: General Surgery

## 2013-02-22 NOTE — Telephone Encounter (Signed)
Patient will call back to schedule ?

## 2013-03-02 ENCOUNTER — Encounter (HOSPITAL_COMMUNITY): Payer: Self-pay | Admitting: Pharmacy Technician

## 2013-03-08 ENCOUNTER — Encounter (HOSPITAL_COMMUNITY)
Admission: RE | Admit: 2013-03-08 | Discharge: 2013-03-08 | Disposition: A | Discharge status: Home / Self Care | Payer: PRIVATE HEALTH INSURANCE | Source: Ambulatory Visit | Attending: General Surgery | Admitting: General Surgery

## 2013-03-08 ENCOUNTER — Encounter (HOSPITAL_COMMUNITY): Payer: Self-pay

## 2013-03-08 HISTORY — DX: Unspecified hearing loss, unspecified ear: H91.90

## 2013-03-08 LAB — COMPREHENSIVE METABOLIC PANEL
ALT: 12 U/L (ref 0–35)
AST: 14 U/L (ref 0–37)
Albumin: 3.4 g/dL — ABNORMAL LOW (ref 3.5–5.2)
Alkaline Phosphatase: 88 U/L (ref 39–117)
BUN: 12 mg/dL (ref 6–23)
CO2: 23 mEq/L (ref 19–32)
Calcium: 8.9 mg/dL (ref 8.4–10.5)
Chloride: 105 mEq/L (ref 96–112)
Creatinine, Ser: 0.61 mg/dL (ref 0.50–1.10)
GFR calc Af Amer: >90 mL/min (ref >90)
GFR calc non Af Amer: >90 mL/min (ref >90)
Glucose, Bld: 91 mg/dL (ref 70–99)
Potassium: 4.2 mEq/L (ref 3.7–5.3)
Sodium: 140 mEq/L (ref 137–147)
Total Bilirubin: 0.4 mg/dL (ref 0.3–1.2)
Total Protein: 7.7 g/dL (ref 6.0–8.3)

## 2013-03-08 LAB — CBC WITH DIFFERENTIAL/PLATELET
Basophils Absolute: 0 10*3/uL (ref 0.0–0.1)
Basophils Relative: 0 % (ref 0–1)
Eosinophils Absolute: 0.1 10*3/uL (ref 0.0–0.7)
Eosinophils Relative: 2 % (ref 0–5)
HCT: 36.2 % (ref 36.0–46.0)
Hemoglobin: 11.9 g/dL — ABNORMAL LOW (ref 12.0–15.0)
Lymphocytes Relative: 41 % (ref 12–46)
Lymphs Abs: 2 10*3/uL (ref 0.7–4.0)
MCH: 28.1 pg (ref 26.0–34.0)
MCHC: 32.9 g/dL (ref 30.0–36.0)
MCV: 85.4 fL (ref 78.0–100.0)
Monocytes Absolute: 0.7 10*3/uL (ref 0.1–1.0)
Monocytes Relative: 14 % — ABNORMAL HIGH (ref 3–12)
Neutro Abs: 2.1 10*3/uL (ref 1.7–7.7)
Neutrophils Relative %: 43 % (ref 43–77)
Platelets: 299 10*3/uL (ref 150–400)
RBC: 4.24 MIL/uL (ref 3.87–5.11)
RDW: 14.6 % (ref 11.5–15.5)
WBC: 4.9 10*3/uL (ref 4.0–10.5)

## 2013-03-08 MED ORDER — DEXTROSE 5 % IV SOLN
2.0000 g | INTRAVENOUS | Status: AC
  Administered 2013-03-09: 2 g via INTRAVENOUS
  Filled 2013-03-08: qty 2

## 2013-03-08 NOTE — Progress Notes (Signed)
03/08/13 0943  OBSTRUCTIVE SLEEP APNEA  Have you ever been diagnosed with sleep apnea through a sleep study? No  Do you snore loudly (loud enough to be heard through closed doors)?  1  Do you often feel tired, fatigued, or sleepy during the daytime? 1  Has anyone observed you stop breathing during your sleep? 0  Do you have, or are you being treated for high blood pressure? 0  BMI more than 35 kg/m2? 1  Age over 68 years old? 1  Neck circumference greater than 40 cm/18 inches? 0  Gender: 0  Obstructive Sleep Apnea Score 4  Score 4 or greater  Results sent to PCP

## 2013-03-08 NOTE — Progress Notes (Signed)
Anesthesia Chart Review:  Patient is a 68 year old female scheduled for laparoscopic cholecystectomy on 03/09/13 by Dr. Redmond Pulling.    Anesthesia: Patient is a 68 year old female scheduled for a left TKA on 04/29/11. History includes former smoker, morbid obesity, anemia, hard of hearing, back pain, and left knee DJD s/p left TKA '13. She is s/p a vaginal hysterectomy with BSO on 06/30/10 for Grade 2 endometrial carcinoma and fibroids. OSA screening score was 4.   EKG from 03/08/13 showed NSR, right BBB, minimal voltage criteria for LVH, T wave abnormality, consider inferior ischemia.  (Her T wave abnormality is rather diffuse, some of which may be secondary changes due to right BBB.) I don't think that it is significantly changed since 04/25/11.  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    Preoperative labs acceptable.  I think her EKG is stable since at least 04/2011.  She has tolerated a TKA since then.  No CV symptoms were documented from her PAT visit this morning.  Further evaluation by her assigned anesthesiologist on the day of surgery.  If no acute changes then I would anticipate that she could proceed as planned.  George Hugh [facility] Short Stay Center/Anesthesiology Phone [phone] 03/08/2013 2:57 PM

## 2013-03-08 NOTE — Pre-Procedure Instructions (Signed)
[name]  03/08/2013   Your procedure is scheduled on:  Tuesday, January 20.  Report to [facility], Main Entrance / Entrance "A" at 5:30 AM.  Call this number if you have problems the morning of surgery: [phone]   Remember:   Do not eat food or drink liquids after midnight.    Take these medicines the morning of surgery with A SIP OF WATER: dexlansoprazole (DEXILANT) if needed.    Do not wear jewelry, make-up or nail polish.  Do not wear lotions, powders, or perfumes.  Do not shave 48 hours prior to surgery.   Do not bring valuables to the hospital.  [facility] is not responsible for  any belongings or valuables.               Contacts, dentures or bridgework may not be worn into surgery.  Leave suitcase in the car. After surgery it may be brought to your room.  For patients admitted to the hospital, discharge time is determined by your treatment team.               Patients discharged the day of surgery will not be allowed to drive home.  Name and phone number of your driver: - family/friends   Special Instructions: Shower with CHG wash (Bactoshield) tonight and again in the am prior to arriving to hospital.   Please read over the following fact sheets that you were given: Pain Booklet, Coughing and Deep Breathing and Surgical Site Infection Prevention

## 2013-03-09 ENCOUNTER — Encounter (HOSPITAL_COMMUNITY): Payer: PRIVATE HEALTH INSURANCE | Admitting: Vascular Surgery

## 2013-03-09 ENCOUNTER — Encounter (HOSPITAL_COMMUNITY)
Admission: RE | Disposition: A | Discharge status: Home / Self Care | Payer: Self-pay | Source: Ambulatory Visit | Attending: General Surgery

## 2013-03-09 ENCOUNTER — Ambulatory Visit (HOSPITAL_COMMUNITY): Payer: PRIVATE HEALTH INSURANCE | Admitting: Anesthesiology

## 2013-03-09 ENCOUNTER — Encounter (HOSPITAL_COMMUNITY): Payer: Self-pay | Admitting: *Deleted

## 2013-03-09 ENCOUNTER — Ambulatory Visit (HOSPITAL_COMMUNITY)
Admission: RE | Admit: 2013-03-09 | Discharge: 2013-03-09 | Disposition: A | Discharge status: Home / Self Care | Payer: PRIVATE HEALTH INSURANCE | Source: Ambulatory Visit | Attending: General Surgery | Admitting: General Surgery

## 2013-03-09 DIAGNOSIS — IMO0002 Reserved for concepts with insufficient information to code with codable children: Secondary | ICD-10-CM | POA: Insufficient documentation

## 2013-03-09 DIAGNOSIS — D126 Benign neoplasm of colon, unspecified: Secondary | ICD-10-CM | POA: Insufficient documentation

## 2013-03-09 DIAGNOSIS — D649 Anemia, unspecified: Secondary | ICD-10-CM | POA: Insufficient documentation

## 2013-03-09 DIAGNOSIS — M1710 Unilateral primary osteoarthritis, unspecified knee: Secondary | ICD-10-CM | POA: Insufficient documentation

## 2013-03-09 DIAGNOSIS — K219 Gastro-esophageal reflux disease without esophagitis: Secondary | ICD-10-CM | POA: Insufficient documentation

## 2013-03-09 DIAGNOSIS — Z87891 Personal history of nicotine dependence: Secondary | ICD-10-CM | POA: Insufficient documentation

## 2013-03-09 DIAGNOSIS — K811 Chronic cholecystitis: Secondary | ICD-10-CM

## 2013-03-09 DIAGNOSIS — K8010 Calculus of gallbladder with chronic cholecystitis without obstruction: Secondary | ICD-10-CM | POA: Insufficient documentation

## 2013-03-09 HISTORY — PX: CHOLECYSTECTOMY: SHX55

## 2013-03-09 SURGERY — LAPAROSCOPIC CHOLECYSTECTOMY
Anesthesia: General | Site: Abdomen

## 2013-03-09 MED ORDER — ACETAMINOPHEN 325 MG PO TABS
650.0000 mg | ORAL_TABLET | ORAL | Status: DC | PRN
  Filled 2013-03-09: qty 2

## 2013-03-09 MED ORDER — LIDOCAINE HCL (CARDIAC) 20 MG/ML IV SOLN
INTRAVENOUS | Status: DC | PRN
  Administered 2013-03-09: 100 mg via INTRAVENOUS

## 2013-03-09 MED ORDER — OXYCODONE HCL 5 MG PO TABS
5.0000 mg | ORAL_TABLET | Freq: Once | ORAL | Status: DC | PRN
  Filled 2013-03-09 (×2): qty 1

## 2013-03-09 MED ORDER — EPHEDRINE SULFATE 50 MG/ML IJ SOLN
INTRAMUSCULAR | Status: DC | PRN
  Administered 2013-03-09: 10 mg via INTRAVENOUS

## 2013-03-09 MED ORDER — OXYCODONE HCL 5 MG PO TABS
5.0000 mg | ORAL_TABLET | ORAL | Status: DC | PRN
  Administered 2013-03-09: 10 mg via ORAL

## 2013-03-09 MED ORDER — GLYCOPYRROLATE 0.2 MG/ML IJ SOLN
INTRAMUSCULAR | Status: DC | PRN
  Administered 2013-03-09: 0.2 mg via INTRAVENOUS
  Administered 2013-03-09: .8 mg via INTRAVENOUS

## 2013-03-09 MED ORDER — SODIUM CHLORIDE 0.9 % IV SOLN: 250.0000 mL | INTRAVENOUS | Status: DC | PRN

## 2013-03-09 MED ORDER — HYDROMORPHONE HCL PF 1 MG/ML IJ SOLN
0.2500 mg | INTRAMUSCULAR | Status: DC | PRN
  Administered 2013-03-09 (×3): 0.5 mg via INTRAVENOUS

## 2013-03-09 MED ORDER — POTASSIUM CHLORIDE IN NACL 20-0.9 MEQ/L-% IV SOLN: INTRAVENOUS | Status: DC

## 2013-03-09 MED ORDER — SUCCINYLCHOLINE CHLORIDE 20 MG/ML IJ SOLN
INTRAMUSCULAR | Status: DC | PRN
  Administered 2013-03-09 (×2): 100 mg via INTRAVENOUS

## 2013-03-09 MED ORDER — PROPOFOL 10 MG/ML IV BOLUS
INTRAVENOUS | Status: DC | PRN
  Administered 2013-03-09: 300 mg via INTRAVENOUS

## 2013-03-09 MED ORDER — MORPHINE SULFATE 2 MG/ML IJ SOLN: 1.0000 mg | INTRAMUSCULAR | Status: DC | PRN

## 2013-03-09 MED ORDER — MIDAZOLAM HCL 5 MG/5ML IJ SOLN
INTRAMUSCULAR | Status: DC | PRN
  Administered 2013-03-09 (×2): 1 mg via INTRAVENOUS

## 2013-03-09 MED ORDER — ONDANSETRON HCL 4 MG/2ML IJ SOLN
4.0000 mg | Freq: Four times a day (QID) | INTRAMUSCULAR | Status: DC | PRN
  Filled 2013-03-09: qty 2

## 2013-03-09 MED ORDER — ACETAMINOPHEN 650 MG RE SUPP
650.0000 mg | RECTAL | Status: DC | PRN
Start: 2013-03-09 — End: 2013-03-09
  Filled 2013-03-09: qty 1

## 2013-03-09 MED ORDER — BUPIVACAINE-EPINEPHRINE 0.25% -1:200000 IJ SOLN
INTRAMUSCULAR | Status: DC | PRN
  Administered 2013-03-09: 23 mL

## 2013-03-09 MED ORDER — OXYCODONE-ACETAMINOPHEN 5-325 MG PO TABS: 1.0000 | ORAL_TABLET | ORAL | Status: DC | PRN

## 2013-03-09 MED ORDER — OXYCODONE HCL 5 MG/5ML PO SOLN: 5.0000 mg | Freq: Once | ORAL | Status: DC | PRN

## 2013-03-09 MED ORDER — NEOSTIGMINE METHYLSULFATE 1 MG/ML IJ SOLN
INTRAMUSCULAR | Status: DC | PRN
  Administered 2013-03-09: 5 mg via INTRAVENOUS

## 2013-03-09 MED ORDER — SODIUM CHLORIDE 0.9 % IJ SOLN: 3.0000 mL | INTRAMUSCULAR | Status: DC | PRN

## 2013-03-09 MED ORDER — ONDANSETRON HCL 4 MG/2ML IJ SOLN
4.0000 mg | Freq: Once | INTRAMUSCULAR | Status: DC | PRN
Start: 2013-03-09 — End: 2013-03-09

## 2013-03-09 MED ORDER — HYDROMORPHONE HCL PF 1 MG/ML IJ SOLN
INTRAMUSCULAR | Status: AC
  Filled 2013-03-09: qty 1

## 2013-03-09 MED ORDER — ROCURONIUM BROMIDE 100 MG/10ML IV SOLN
INTRAVENOUS | Status: DC | PRN
  Administered 2013-03-09: 5 mg via INTRAVENOUS
  Administered 2013-03-09: 25 mg via INTRAVENOUS

## 2013-03-09 MED ORDER — 0.9 % SODIUM CHLORIDE (POUR BTL) OPTIME
TOPICAL | Status: DC | PRN
  Administered 2013-03-09: 1000 mL

## 2013-03-09 MED ORDER — FENTANYL CITRATE 0.05 MG/ML IJ SOLN
INTRAMUSCULAR | Status: DC | PRN
  Administered 2013-03-09: 50 ug via INTRAVENOUS
  Administered 2013-03-09: 100 ug via INTRAVENOUS

## 2013-03-09 MED ORDER — ONDANSETRON HCL 4 MG/2ML IJ SOLN
INTRAMUSCULAR | Status: DC | PRN
  Administered 2013-03-09: 4 mg via INTRAVENOUS

## 2013-03-09 MED ORDER — SODIUM CHLORIDE 0.9 % IR SOLN
Status: DC | PRN
Start: 2013-03-09 — End: 2013-03-09
  Administered 2013-03-09: 1000 mL

## 2013-03-09 MED ORDER — SODIUM CHLORIDE 0.9 % IJ SOLN: 3.0000 mL | Freq: Two times a day (BID) | INTRAMUSCULAR | Status: DC

## 2013-03-09 MED ORDER — LACTATED RINGERS IV SOLN
INTRAVENOUS | Status: DC | PRN
  Administered 2013-03-09 (×2): via INTRAVENOUS

## 2013-03-09 MED ORDER — SODIUM CHLORIDE 0.9 % IV SOLN
INTRAVENOUS | Status: DC | PRN
  Administered 2013-03-09: 08:00:00

## 2013-03-09 MED ORDER — BUPIVACAINE-EPINEPHRINE (PF) 0.25% -1:200000 IJ SOLN
INTRAMUSCULAR | Status: AC
  Filled 2013-03-09: qty 30

## 2013-03-09 MED ORDER — DEXAMETHASONE SODIUM PHOSPHATE 4 MG/ML IJ SOLN
INTRAMUSCULAR | Status: DC | PRN
  Administered 2013-03-09: 8 mg via INTRAVENOUS

## 2013-03-09 SURGICAL SUPPLY — 48 items
APL SKNCLS STERI-STRIP NONHPOA (GAUZE/BANDAGES/DRESSINGS) ×2
APPLIER CLIP 5 13 M/L LIGAMAX5 (MISCELLANEOUS) ×3
APR CLP MED LRG 5 ANG JAW (MISCELLANEOUS) ×2
BAG SPEC RTRVL LRG 6X4 10 (ENDOMECHANICALS) ×2
BANDAGE ADHESIVE 1X3 (GAUZE/BANDAGES/DRESSINGS) ×9 IMPLANT
BENZOIN TINCTURE PRP APPL 2/3 (GAUZE/BANDAGES/DRESSINGS) ×3 IMPLANT
BLADE SURG ROTATE 9660 (MISCELLANEOUS) IMPLANT
CANISTER SUCTION 2500CC (MISCELLANEOUS) ×3 IMPLANT
CHLORAPREP W/TINT 26ML (MISCELLANEOUS) ×3 IMPLANT
CLIP APPLIE 5 13 M/L LIGAMAX5 (MISCELLANEOUS) ×2 IMPLANT
COVER MAYO STAND STRL (DRAPES) ×3 IMPLANT
COVER SURGICAL LIGHT HANDLE (MISCELLANEOUS) ×3 IMPLANT
DECANTER SPIKE VIAL GLASS SM (MISCELLANEOUS) ×1 IMPLANT
DRAPE C-ARM 42X72 X-RAY (DRAPES) ×3 IMPLANT
DRAPE UTILITY 15X26 W/TAPE STR (DRAPE) ×6 IMPLANT
DRSG TEGADERM 4X4.75 (GAUZE/BANDAGES/DRESSINGS) ×3 IMPLANT
ELECT REM PT RETURN 9FT ADLT (ELECTROSURGICAL) ×3
ELECTRODE REM PT RTRN 9FT ADLT (ELECTROSURGICAL) ×2 IMPLANT
GAUZE SPONGE 2X2 8PLY STRL LF (GAUZE/BANDAGES/DRESSINGS) ×2 IMPLANT
GLOVE BIO SURGEON STRL SZ 6.5 (GLOVE) ×2 IMPLANT
GLOVE BIO SURGEON STRL SZ7.5 (GLOVE) ×2 IMPLANT
GLOVE BIOGEL M STRL SZ7.5 (GLOVE) ×3 IMPLANT
GLOVE BIOGEL PI IND STRL 7.0 (GLOVE) ×1 IMPLANT
GLOVE BIOGEL PI IND STRL 7.5 (GLOVE) ×1 IMPLANT
GLOVE BIOGEL PI IND STRL 8 (GLOVE) ×1 IMPLANT
GLOVE BIOGEL PI INDICATOR 7.0 (GLOVE) ×1
GLOVE BIOGEL PI INDICATOR 7.5 (GLOVE) ×1
GLOVE BIOGEL PI INDICATOR 8 (GLOVE)
GLOVE SURG SS PI 7.0 STRL IVOR (GLOVE) ×2 IMPLANT
GOWN STRL NON-REIN LRG LVL3 (GOWN DISPOSABLE) ×7 IMPLANT
GOWN STRL REIN XL XLG (GOWN DISPOSABLE) ×3 IMPLANT
KIT BASIN OR (CUSTOM PROCEDURE TRAY) ×3 IMPLANT
KIT ROOM TURNOVER OR (KITS) ×3 IMPLANT
NS IRRIG 1000ML POUR BTL (IV SOLUTION) ×3 IMPLANT
PAD ARMBOARD 7.5X6 YLW CONV (MISCELLANEOUS) ×3 IMPLANT
POUCH SPECIMEN RETRIEVAL 10MM (ENDOMECHANICALS) ×3 IMPLANT
SCISSORS LAP 5X35 DISP (ENDOMECHANICALS) ×3 IMPLANT
SET CHOLANGIOGRAPH 5 50 .035 (SET/KITS/TRAYS/PACK) ×3 IMPLANT
SET IRRIG TUBING LAPAROSCOPIC (IRRIGATION / IRRIGATOR) ×3 IMPLANT
SLEEVE ENDOPATH XCEL 5M (ENDOMECHANICALS) ×6 IMPLANT
SPECIMEN JAR SMALL (MISCELLANEOUS) ×3 IMPLANT
SPONGE GAUZE 2X2 STER 10/PKG (GAUZE/BANDAGES/DRESSINGS) ×1
SUT MNCRL AB 4-0 PS2 18 (SUTURE) ×3 IMPLANT
TOWEL OR 17X24 6PK STRL BLUE (TOWEL DISPOSABLE) ×1 IMPLANT
TOWEL OR 17X26 10 PK STRL BLUE (TOWEL DISPOSABLE) ×3 IMPLANT
TRAY LAPAROSCOPIC (CUSTOM PROCEDURE TRAY) ×3 IMPLANT
TROCAR XCEL BLUNT TIP 100MML (ENDOMECHANICALS) ×3 IMPLANT
TROCAR XCEL NON-BLD 5MMX100MML (ENDOMECHANICALS) ×3 IMPLANT

## 2013-03-09 NOTE — Progress Notes (Signed)
Called periop for post op bed. They will call me back

## 2013-03-09 NOTE — Anesthesia Postprocedure Evaluation (Signed)
  Anesthesia Post-op Note  Patient: [name]  Procedure(s) Performed: Procedure(s): LAPAROSCOPIC CHOLECYSTECTOMY (N/A)  Patient Location: PACU  Anesthesia Type:General  Level of Consciousness: awake  Airway and Oxygen Therapy: Patient Spontanous Breathing and Patient connected to nasal cannula oxygen  Post-op Pain: mild  Post-op Assessment: Post-op Vital signs reviewed  Post-op Vital Signs: Reviewed  Complications: No apparent anesthesia complications

## 2013-03-09 NOTE — Preoperative (Signed)
Beta Blockers   Reason not to administer Beta Blockers:Not Applicable 

## 2013-03-09 NOTE — H&P (View-Only) (Signed)
Patient ID: [name], female   DOB: 12-29-1945, 68 y.o.   MRN: [id number]  Chief Complaint  Patient presents with  . New Evaluation    eval GB    HPI [name] is a 68 y.o. female.   HPI 68 year old morbidly obese African American female referred by Dr. Sharlett Iles for evaluation of abdominal pain. The patient has a history of acid reflux and initially thought her symptoms were due to acid reflux however they were not improving so she followed up with Dr. Sharlett Iles. She states over the past several months she has had intermittent right-sided abdominal pain that lasts anywhere from minutes to one hour. It is associated with nausea. She denies any fevers or chills. She denies any regurgitation. She denies any heavy NSAID use. She denies any jaundice or acholic stools. She denies any diarrhea or constipation. She has noticed that if she eat greasy foods it tends to exacerbate the discomfort on the right side. She denies any weight loss. Past Medical History  Diagnosis Date  . 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 Arthritis     Back   . Anemia   . GERD (gastroesophageal reflux disease)   . Colon polyp     Tubular Adenoma     Past Surgical History  Procedure Laterality Date  . Abdominal hysterectomy  2012  . Total knee arthroplasty  04/29/2011    Procedure: TOTAL KNEE ARTHROPLASTY;  Surgeon: Lorn Junes, MD;  Location: [facility];  Service: Orthopedics;  Laterality: Left;  DR [place] THIS CASE  . Colonoscopy w/ biopsies      Family History  Problem Relation Age of Onset  . Arthritis Mother   . Hypertension Mother   . Alzheimer's disease Father   . Diabetes Sister   . Hypertension Sister   . Hypertension Brother   . Stroke Brother   . Hypertension Other   . Hypertension Brother   . Hypertension Sister   . Hypertension Sister   . Hypertension Sister   . Anesthesia problems Neg Hx   . Hypotension Neg Hx   . Malignant hyperthermia Neg Hx   . Pseudochol deficiency Neg Hx    . Colon cancer Neg Hx     Social History History  Substance Use Topics  . Smoking status: Former Smoker    Quit date: 04/22/1988  . Smokeless tobacco: Never Used  . Alcohol Use: No    No Known Allergies  Current Outpatient Prescriptions  Medication Sig Dispense Refill  . acetaminophen (TYLENOL) 325 MG tablet Take 325 mg by mouth as needed.       Marland Kitchen dexlansoprazole (DEXILANT) 60 MG capsule Take 1 capsule (60 mg total) by mouth daily.  10 capsule  0  . Ibuprofen (MOTRIN PO) Take by mouth as needed.       No current facility-administered medications for this visit.    Review of Systems Review of Systems  Constitutional: Negative for fever, activity change, appetite change and unexpected weight change.  HENT: Negative for nosebleeds and trouble swallowing.   Eyes: Negative for photophobia and visual disturbance.  Respiratory: Negative for chest tightness and shortness of breath.   Cardiovascular: Negative for chest pain and leg swelling.       Denies CP, SOB, orthopnea, PND, DOE  Gastrointestinal:       See HPI  Genitourinary: Negative for dysuria and difficulty urinating.       Vag hysterectomy  Musculoskeletal: Negative for arthralgias.  Skin: Negative for pallor  and rash.  Neurological: Negative for dizziness, seizures, facial asymmetry and numbness.       Denies TIA and amaurosis fugax   Hematological: Negative for adenopathy. Does not bruise/bleed easily.  Psychiatric/Behavioral: Negative for behavioral problems and agitation.    Blood pressure 140/84, pulse 92, temperature 97.9 F (36.6 C), temperature source Temporal, resp. rate 14, height 5' 2" (1.575 m), weight 254 lb (115.214 kg).  Physical Exam Physical Exam  Vitals reviewed. Constitutional: She is oriented to person, place, and time. She appears well-developed and well-nourished. No distress.  Morbidly obese  HENT:  Head: Normocephalic and atraumatic.  Right Ear: External ear normal.  Left Ear:  External ear normal.  Eyes: Conjunctivae are normal. No scleral icterus.  Neck: Normal range of motion. Neck supple. No tracheal deviation present. No thyromegaly present.  Cardiovascular: Normal rate and normal heart sounds.   Pulmonary/Chest: Effort normal and breath sounds normal. No stridor. No respiratory distress. She has no wheezes.  Abdominal: Soft. She exhibits no distension. There is no tenderness. There is no rebound and no guarding.    Rt mid abdomen scar  Musculoskeletal: She exhibits no edema and no tenderness.  Lymphadenopathy:    She has no cervical adenopathy.  Neurological: She is alert and oriented to person, place, and time. She exhibits normal muscle tone.  Skin: Skin is warm and dry. No rash noted. She is not diaphoretic. No erythema.  Psychiatric: She has a normal mood and affect. Her behavior is normal. Judgment and thought content normal.    Data Reviewed Dr Patterson's note abd u/s - gallstone, fatty liver  Assessment    Symptomatic cholelithiasis     Plan    I believe the patient's symptoms are consistent with gallbladder disease.  We discussed gallbladder disease. The patient was given educational material. We discussed non-operative and operative management. We discussed the signs & symptoms of acute cholecystitis  I discussed laparoscopic cholecystectomy with IOC in detail.  The patient was given educational material as well as diagrams detailing the procedure.  We discussed the risks and benefits of a laparoscopic cholecystectomy including, but not limited to bleeding, infection, injury to surrounding structures such as the intestine or liver, bile leak, retained gallstones, need to convert to an open procedure, prolonged diarrhea, blood clots such as  DVT, common bile duct injury, anesthesia risks, and possible need for additional procedures.  We discussed the typical post-operative recovery course. I explained that the likelihood of improvement of  their symptoms is good.  She needs to discuss timing of surgery with her family and she was instructed to call our office to schedule surgery once she has figured this out  July Linam M. Mckale Haffey, MD, FACS General, Bariatric, & Minimally Invasive Surgery [facility]         Mariselda Badalamenti M 02/19/2013, 2:51 PM    

## 2013-03-09 NOTE — Discharge Instructions (Signed)
[facility]: POST OP INSTRUCTIONS Always review your discharge instruction sheet given to you by the facility where your surgery was performed. IF YOU HAVE DISABILITY OR FAMILY LEAVE FORMS, YOU MUST BRING THEM TO THE OFFICE FOR PROCESSING.   DO NOT GIVE THEM TO YOUR DOCTOR.  1. A prescription for pain medication may be given to you upon discharge.  Take your pain medication as prescribed, if needed.  If narcotic pain medicine is not needed, then you may take acetaminophen (Tylenol) or ibuprofen (Advil) as needed. 2. Take your usually prescribed medications unless otherwise directed. 3. If you need a refill on your pain medication, please contact your pharmacy.  They will contact our office to request authorization. Prescriptions will not be filled after 5pm or on week-ends. 4. You should follow a light diet the first few days after arrival home, such as soup and crackers, etc.  Be sure to include lots of fluids daily. 5. Most patients will experience some swelling and bruising in the area of the incisions.  Ice packs will help.  Swelling and bruising can take several days to resolve.  6. It is common to experience some constipation if taking pain medication after surgery.  Increasing fluid intake and taking a stool softener (such as Colace) will usually help or prevent this problem from occurring.  A mild laxative (Milk of Magnesia or Miralax) should be taken according to package instructions if there are no bowel movements after 48 hours. 7. Unless discharge instructions indicate otherwise, you may remove your bandages 24-48 hours after surgery, and you may shower at that time.  You may have steri-strips (small skin tapes) in place directly over the incision.  These strips should be left on the skin for 7-10 days.  If your surgeon used skin glue on the incision, you may shower in 24 hours.  The glue will flake off over the next 2-3 weeks.  Any sutures or  staples will be removed at the office during your follow-up visit. 8. ACTIVITIES:  You may resume regular (light) daily activities beginning the next day--such as daily self-care, walking, climbing stairs--gradually increasing activities as tolerated.  You may have sexual intercourse when it is comfortable.  Refrain from any heavy lifting or straining until approved by your doctor. a. You may drive when you are no longer taking prescription pain medication, you can comfortably wear a seatbelt, and you can safely maneuver your car and apply brakes. 9. You should see your doctor in the office for a follow-up appointment approximately 2-3 weeks after your surgery.  Make sure that you call for this appointment within a day or two after you arrive home to insure a convenient appointment time. 10. OTHER INSTRUCTIONS:  WHEN TO CALL YOUR DOCTOR: 1. Fever over 101.0 2. Inability to urinate 3. Continued bleeding from incision. 4. Increased pain, redness, or drainage from the incision. 5. Increasing abdominal pain  The clinic staff is available to answer your questions during regular business hours.  Please dont hesitate to call and ask to speak to one of the nurses for clinical concerns.  If you have a medical emergency, go to the nearest emergency room or call 911.  A surgeon from [facility] is always on call at the hospital. [address], [place], [place], [place]  [address] ? [address], [place], [place]   [address] [phone] ? [phone] ? FAX [phone] Web site: [email or web address]   What to eat:  For your first meals, you should eat lightly; only small meals initially.  If you do not have nausea, you may eat larger meals.  Avoid spicy, greasy and heavy food.    General Anesthesia, Adult, Care After  Refer to this sheet in the next few weeks. These instructions provide you with information on caring for yourself after your procedure. Your health care  provider may also give you more specific instructions. Your treatment has been planned according to current medical practices, but problems sometimes occur. Call your health care provider if you have any problems or questions after your procedure.  WHAT TO EXPECT AFTER THE PROCEDURE  After the procedure, it is typical to experience:  Sleepiness.  Nausea and vomiting. HOME CARE INSTRUCTIONS  For the first 24 hours after general anesthesia:  Have a responsible person with you.  Do not drive a car. If you are alone, do not take public transportation.  Do not drink alcohol.  Do not take medicine that has not been prescribed by your health care provider.  Do not sign important papers or make important decisions.  You may resume a normal diet and activities as directed by your health care provider.  Change bandages (dressings) as directed.  If you have questions or problems that seem related to general anesthesia, call the hospital and ask for the anesthetist or anesthesiologist on call. SEEK MEDICAL CARE IF:  You have nausea and vomiting that continue the day after anesthesia.  You develop a rash. SEEK IMMEDIATE MEDICAL CARE IF:  You have difficulty breathing.  You have chest pain.  You have any allergic problems. Document Released: 05/13/2000 Document Revised: 10/07/2012 Document Reviewed: 08/20/2012  [facility] Patient Information 2014 [place], [place].

## 2013-03-09 NOTE — Transfer of Care (Signed)
Immediate Anesthesia Transfer of Care Note  Patient: [name]  Procedure(s) Performed: Procedure(s): LAPAROSCOPIC CHOLECYSTECTOMY (N/A)  Patient Location: PACU  Anesthesia Type:General  Level of Consciousness: awake, alert  and oriented  Airway & Oxygen Therapy: Patient Spontanous Breathing and Patient connected to face mask oxygen  Post-op Assessment: Report given to PACU RN  Post vital signs: Reviewed and stable  Complications: No apparent anesthesia complications

## 2013-03-09 NOTE — Anesthesia Preprocedure Evaluation (Signed)
Anesthesia Evaluation  Patient identified by MRN, date of birth, ID band Patient awake    Reviewed: Allergy & Precautions, H&P , NPO status , Patient's Chart, lab work & pertinent test results  Airway Mallampati: I TM Distance: >3 FB Neck ROM: Full    Dental  (+) Teeth Intact and Dental Advisory Given   Pulmonary former smoker,  breath sounds clear to auscultation        Cardiovascular Rhythm:Regular Rate:Normal     Neuro/Psych    GI/Hepatic GERD-  Medicated and Controlled,  Endo/Other    Renal/GU      Musculoskeletal   Abdominal   Peds  Hematology   Anesthesia Other Findings   Reproductive/Obstetrics                           Anesthesia Physical Anesthesia Plan  ASA: I  Anesthesia Plan: General   Post-op Pain Management:    Induction: Intravenous  Airway Management Planned: Oral ETT  Additional Equipment:   Intra-op Plan:   Post-operative Plan: Extubation in OR  Informed Consent: I have reviewed the patients History and Physical, chart, labs and discussed the procedure including the risks, benefits and alternatives for the proposed anesthesia with the patient or authorized representative who has indicated his/her understanding and acceptance.   Dental advisory given  Plan Discussed with: CRNA, Anesthesiologist and Surgeon  Anesthesia Plan Comments:         Anesthesia Quick Evaluation

## 2013-03-09 NOTE — Progress Notes (Signed)
C/O NOT BEING ABLE TO COUGH; O2 SAT 100% ON ROOM AIR; LUNG SOUNDS CLEAR BILAT; DR Tamala Julian NOTIFIED OF COMPLAINT,SAT AND LUNG SOUNDS AND NO NEW ORDERS NOTED; PER DR SMITH CLIENT NOTIFIED THAT THE FEELING OF DIFFICULTY COUGHING WAS DUE TO INTUBATION AND WILL GET BETTER

## 2013-03-09 NOTE — Interval H&P Note (Signed)
History and Physical Interval Note:  03/09/2013 7:13 AM  [name]  has presented today for surgery, with the diagnosis of Symptomatic cholelithiasis  The various methods of treatment have been discussed with the patient and family. After consideration of risks, benefits and other options for treatment, the patient has consented to  Procedure(s): LAPAROSCOPIC CHOLECYSTECTOMY WITH INTRAOPERATIVE CHOLANGIOGRAM (N/A) as a surgical intervention .  The patient's history has been reviewed, patient examined, no change in status, stable for surgery.  I have reviewed the patient's chart and labs.  Questions were answered to the patient's satisfaction.   [name]. Redmond Pulling, MD, [place], Bariatric, & Minimally Invasive Surgery [facility], [place]   [facility] M

## 2013-03-09 NOTE — Anesthesia Procedure Notes (Signed)
Procedure Name: Intubation Date/Time: 03/09/2013 7:55 AM Performed by: Maleki Hippe, Virgel Gess Pre-anesthesia Checklist: Patient identified, Timeout performed, Emergency Drugs available, Suction available and Patient being monitored Patient Re-evaluated:Patient Re-evaluated prior to inductionOxygen Delivery Method: Circle system utilized Preoxygenation: Pre-oxygenation with 100% oxygen Intubation Type: IV induction Ventilation: Mask ventilation without difficulty Laryngoscope Size: Mac and 3 Grade View: Grade III Tube type: Oral Tube size: 7.0 mm Number of attempts: 2 Airway Equipment and Method: Bougie stylet and Video-laryngoscopy Placement Confirmation: breath sounds checked- equal and bilateral,  CO2 detector and positive ETCO2 Secured at: 23 cm Tube secured with: Tape Dental Injury: Teeth and Oropharynx as per pre-operative assessment  Difficulty Due To: Difficulty was anticipated Future Recommendations: Recommend- induction with short-acting agent, and alternative techniques readily available Comments: Intubation with bougie stylet and video laryngoscope

## 2013-03-09 NOTE — Op Note (Signed)
Laparoscopic Cholecystectomy Procedure Note  Indications: This patient presents with symptomatic gallbladder disease and will undergo laparoscopic cholecystectomy.  Pre-operative Diagnosis: symptomatic cholelithiasis  Post-operative Diagnosis: Same; very contracted gallbladder  Surgeon: Gayland Curry   Assistants: none  Anesthesia: General endotracheal anesthesia  ASA Class: 1  Procedure Details  The patient was seen again in the Holding Room. The risks, benefits, complications, treatment options, and expected outcomes were discussed with the patient. The possibilities of reaction to medication, pulmonary aspiration, perforation of viscus, bleeding, recurrent infection, finding a normal gallbladder, the need for additional procedures, failure to diagnose a condition, the possible need to convert to an open procedure, and creating a complication requiring transfusion or operation were discussed with the patient. The likelihood of improving the patient's symptoms with return to their baseline status is good.  The patient and/or family concurred with the proposed plan, giving informed consent. The site of surgery properly noted. The patient was taken to Operating Room, identified as [name] and the procedure verified as Laparoscopic Cholecystectomy with possible Intraoperative Cholangiogram. A Time Out was held and the above information confirmed.  Prior to the induction of general anesthesia, antibiotic prophylaxis was administered. General endotracheal anesthesia was then administered and tolerated well. After the induction, the abdomen was prepped with Chloraprep and draped in the sterile fashion. The patient was positioned in the supine position.  Local anesthetic agent was injected into the skin near the umbilicus and an incision made. We dissected down to the abdominal fascia with blunt dissection.  The fascia was incised vertically and we entered the peritoneal cavity bluntly.  A  pursestring suture of 0-Vicryl was placed around the fascial opening.  The Hasson cannula was inserted and secured with the stay suture.  Pneumoperitoneum was then created with CO2 and tolerated well without any adverse changes in the patient's vital signs. An 5-mm port was placed in the subxiphoid position.  Two 5-mm ports were placed in the right upper quadrant. All skin incisions were infiltrated with a local anesthetic agent before making the incision and placing the trocars.   We positioned the patient in reverse Trendelenburg, tilted slightly to the patient's left.  There was some overlying omentum tethered to the liver edge which was lysed with endoshears. The gallbladder was identified. It was very contracted and firm. I was able to grab the fundus and retracted cephalad. Adhesions were lysed bluntly and with the electrocautery where indicated, taking care not to injure any adjacent organs or viscus. The infundibulum was grasped and retracted laterally, exposing the peritoneum overlying the triangle of Calot. This was then divided and exposed in a blunt fashion. A critical view of the cystic duct and cystic artery was obtained.  The cystic duct was clearly identified and bluntly dissected circumferentially. These were the only 2 structures entering the gallbladder.   The cystic duct was then ligated with 3 clips and divided. The cystic artery (both the main cystic artery and a smaller side branch) were identified, dissected free, ligated with clips and divided as well.   The gallbladder was dissected from the liver bed in retrograde fashion with the electrocautery. The gallbladder was removed and placed in an Endocatch sac.  The gallbladder and Endocatch sac were then removed through the umbilical port site. The liver bed was irrigated and inspected. Hemostasis was achieved with the electrocautery. Copious irrigation was utilized and was repeatedly aspirated until clear.  The pursestring suture was  used to close the umbilical fascia.    We again  inspected the right upper quadrant for hemostasis.  The umbilical closure was inspected and there was no air leak and nothing trapped within the closure. Pneumoperitoneum was released as we removed the trocars.  4-0 Monocryl was used to close the skin.   Benzoin, steri-strips, and clean dressings were applied. The patient was then extubated and brought to the recovery room in stable condition. Instrument, sponge, and needle counts were correct at closure and at the conclusion of the case.   Findings: Chronic cholecystitis; very contracted gallbladder - very firm; discussed intraoperative findings with Dr Lyndon Code in pathology; I have small concern for malignancy. +fatty liver. No gross superficial liver lesions  Estimated Blood Loss: Minimal         Drains: none         Specimens: Gallbladder           Complications: None; patient tolerated the procedure well.         Disposition: PACU - hemodynamically stable.         Condition: stable  [name]. Redmond Pulling, MD, FACS General, Bariatric, & Minimally Invasive Surgery [facility], [place]

## 2013-03-11 ENCOUNTER — Encounter (HOSPITAL_COMMUNITY): Payer: Self-pay | Admitting: General Surgery

## 2013-03-26 ENCOUNTER — Ambulatory Visit (INDEPENDENT_AMBULATORY_CARE_PROVIDER_SITE_OTHER): Payer: PRIVATE HEALTH INSURANCE | Admitting: General Surgery

## 2013-03-26 ENCOUNTER — Encounter (INDEPENDENT_AMBULATORY_CARE_PROVIDER_SITE_OTHER): Payer: Self-pay | Admitting: General Surgery

## 2013-03-26 VITALS — BP 130/76 | HR 64 | Temp 97.0°F | Resp 14 | Ht 62.0 in | Wt 248.2 lb

## 2013-03-26 DIAGNOSIS — Z09 Encounter for follow-up examination after completed treatment for conditions other than malignant neoplasm: Secondary | ICD-10-CM

## 2013-03-26 NOTE — Progress Notes (Signed)
Subjective:     Patient ID: [name], female   DOB: 03-08-1945, 68 y.o.   MRN: [id number]  HPI 68 year old morbidly obese African American female comes in for followup after undergoing laparoscopic cholecystectomy on January 20. She states that she is doing well. She denies any fever, chills, nausea or vomiting. She denies any diarrhea or constipation. She states that the pain medicine made her feel bad. She still has some soreness at her epigastric and umbilical trocar sites. She reports a good appetite.  Review of Systems     Objective:   Physical Exam BP 130/76  Pulse 64  Temp(Src) 97 F (36.1 C) (Oral)  Resp 14  Ht 5\' 2"  (1.575 m)  Wt 248 lb 3.2 oz (112.583 kg)  BMI 45.38 kg/m2  Gen: alert, NAD, non-toxic appearing Pupils: equal, no scleral icterus Pulm: Lungs clear to auscultation, symmetric chest rise CV: regular rate and rhythm Abd: soft, nontender, nondistended. Well-healed trocar sites. No cellulitis. No incisional hernia Skin: no rash, no jaundice     Assessment:     S/p laparoscopic cholecystectomy     Plan:     She appears to be doing well. We reviewed her pathology report showed chronic cholecystitis with calcification. There is no evidence of malignancy. Intraoperatively, her gallbladder was very contracted and firm. I advised her to stop taking the pain medicine. I told her she can take Motrin or Tylenol as needed for the discomfort. I explained that the soreness should continue to improve with time. Followup as needed  [name]. Redmond Pulling, MD, FACS General, Bariatric, & Minimally Invasive Surgery [facility], [place]

## 2013-03-26 NOTE — Patient Instructions (Signed)
Stop taking the percocet Can take ibuprofen or Tylenol

## 2014-03-17 ENCOUNTER — Other Ambulatory Visit: Payer: Self-pay | Admitting: Internal Medicine

## 2014-03-17 DIAGNOSIS — I872 Venous insufficiency (chronic) (peripheral): Secondary | ICD-10-CM | POA: Diagnosis not present

## 2014-03-17 DIAGNOSIS — K219 Gastro-esophageal reflux disease without esophagitis: Secondary | ICD-10-CM | POA: Diagnosis not present

## 2014-03-17 DIAGNOSIS — H409 Unspecified glaucoma: Secondary | ICD-10-CM | POA: Diagnosis not present

## 2014-03-17 DIAGNOSIS — M12869 Other specific arthropathies, not elsewhere classified, unspecified knee: Secondary | ICD-10-CM | POA: Diagnosis not present

## 2014-03-17 DIAGNOSIS — E2839 Other primary ovarian failure: Secondary | ICD-10-CM

## 2014-03-17 DIAGNOSIS — Z23 Encounter for immunization: Secondary | ICD-10-CM | POA: Diagnosis not present

## 2014-03-22 DIAGNOSIS — M1711 Unilateral primary osteoarthritis, right knee: Secondary | ICD-10-CM | POA: Diagnosis not present

## 2014-03-22 DIAGNOSIS — Z96652 Presence of left artificial knee joint: Secondary | ICD-10-CM | POA: Diagnosis not present

## 2014-03-24 DIAGNOSIS — H04123 Dry eye syndrome of bilateral lacrimal glands: Secondary | ICD-10-CM | POA: Diagnosis not present

## 2014-03-24 DIAGNOSIS — H18413 Arcus senilis, bilateral: Secondary | ICD-10-CM | POA: Diagnosis not present

## 2014-03-24 DIAGNOSIS — H25013 Cortical age-related cataract, bilateral: Secondary | ICD-10-CM | POA: Diagnosis not present

## 2014-04-05 ENCOUNTER — Ambulatory Visit
Admission: RE | Admit: 2014-04-05 | Discharge: 2014-04-05 | Disposition: A | Discharge status: Home / Self Care | Payer: Medicaid Other | Source: Ambulatory Visit | Attending: Internal Medicine | Admitting: Internal Medicine

## 2014-04-05 DIAGNOSIS — Z780 Asymptomatic menopausal state: Secondary | ICD-10-CM | POA: Diagnosis not present

## 2014-04-05 DIAGNOSIS — E2839 Other primary ovarian failure: Secondary | ICD-10-CM

## 2014-04-26 DIAGNOSIS — M1711 Unilateral primary osteoarthritis, right knee: Secondary | ICD-10-CM | POA: Diagnosis not present

## 2014-06-21 ENCOUNTER — Encounter (HOSPITAL_COMMUNITY): Payer: Self-pay | Admitting: Physician Assistant

## 2014-06-21 DIAGNOSIS — D649 Anemia, unspecified: Secondary | ICD-10-CM | POA: Diagnosis present

## 2014-06-21 DIAGNOSIS — K635 Polyp of colon: Secondary | ICD-10-CM | POA: Diagnosis present

## 2014-06-21 DIAGNOSIS — M1990 Unspecified osteoarthritis, unspecified site: Secondary | ICD-10-CM | POA: Diagnosis present

## 2014-06-21 DIAGNOSIS — K219 Gastro-esophageal reflux disease without esophagitis: Secondary | ICD-10-CM | POA: Diagnosis present

## 2014-06-21 DIAGNOSIS — H9190 Unspecified hearing loss, unspecified ear: Secondary | ICD-10-CM

## 2014-06-21 DIAGNOSIS — Z96651 Presence of right artificial knee joint: Secondary | ICD-10-CM | POA: Diagnosis not present

## 2014-06-21 DIAGNOSIS — M1711 Unilateral primary osteoarthritis, right knee: Secondary | ICD-10-CM | POA: Diagnosis present

## 2014-06-21 NOTE — H&P (Signed)
TOTAL KNEE ADMISSION H&P  Patient is being admitted for right total knee arthroplasty.  Subjective:  Chief Complaint:right knee pain.  HPI: [name], 69 y.o. female, has a history of pain and functional disability in the right knee due to arthritis and has failed non-surgical conservative treatments for greater than 12 weeks to includeNSAID's and/or analgesics, corticosteriod injections, viscosupplementation injections, flexibility and strengthening excercises, supervised PT with diminished ADL's post treatment, use of assistive devices, weight reduction as appropriate and activity modification.  Onset of symptoms was gradual, starting 10 years ago with gradually worsening course since that time. The patient noted no past surgery on the right knee(s).  Patient currently rates pain in the right knee(s) at 10 out of 10 with activity. Patient has night pain, worsening of pain with activity and weight bearing, pain that interferes with activities of daily living, crepitus and joint swelling.  Patient has evidence of subchondral sclerosis, periarticular osteophytes and joint space narrowing by imaging studies.  There is no active infection.  Patient Active Problem List   Diagnosis Date Noted  . Primary localized osteoarthritis of right knee   . Arthritis   . Anemia   . GERD (gastroesophageal reflux disease)   . Colon polyp   . HOH (hard of hearing)   . Postoperative anemia due to acute blood loss 05/01/2011  . 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 Arthritis     Back   . Anemia   . GERD (gastroesophageal reflux disease)   . Colon polyp     Tubular Adenoma   . HOH (hard of hearing)   . Primary localized osteoarthritis of right knee     Past Surgical History  Procedure Laterality Date  . Abdominal hysterectomy  2012  . Total knee arthroplasty  04/29/2011    Procedure: TOTAL KNEE ARTHROPLASTY;  Surgeon: Lorn Junes, MD;   Location: [facility];  Service: Orthopedics;  Laterality: Left;  DR [place] THIS CASE  . Colonoscopy w/ biopsies    . Joint replacement    . Cholecystectomy N/A 03/09/2013    Procedure: LAPAROSCOPIC CHOLECYSTECTOMY;  Surgeon: Gayland Curry, MD;  Location: [facility];  Service: General;  Laterality: N/A;    No current facility-administered medications for this encounter.  Current outpatient prescriptions:  .  furosemide (LASIX) 20 MG tablet, Take 20 mg by mouth daily as needed for fluid (Does not take if going out and about)., Disp: , Rfl:  .  ibuprofen (ADVIL,MOTRIN) 200 MG tablet, Take 400 mg by mouth daily as needed for mild pain., Disp: , Rfl:  .  pantoprazole (PROTONIX) 40 MG tablet, Take 40 mg by mouth daily., Disp: , Rfl:  .  oxyCODONE-acetaminophen (ROXICET) 5-325 MG per tablet, Take 1-2 tablets by mouth every 4 (four) hours as needed for severe pain. (Patient not taking: Reported on 06/20/2014), Disp: 40 tablet, Rfl: 0    No Known Allergies    History  Substance Use Topics  . Smoking status: Former Smoker -- 1 years    Quit date: 04/22/1988  . Smokeless tobacco: Never Used  . Alcohol Use: No    Family History  Problem Relation Age of Onset  . Arthritis Mother   . Hypertension Mother   . Alzheimer's disease Father   . Diabetes Sister   . Hypertension Sister   . Hypertension Brother   . Stroke Brother   . Hypertension Other   .  Hypertension Brother   . Hypertension Sister   . Hypertension Sister   . Hypertension Sister   . Anesthesia problems Neg Hx   . Hypotension Neg Hx   . Malignant hyperthermia Neg Hx   . Pseudochol deficiency Neg Hx   . Colon cancer Neg Hx      Review of Systems  Constitutional: Negative.   HENT: Negative.   Eyes: Negative.   Respiratory: Negative.   Cardiovascular: Negative.   Gastrointestinal: Negative.   Genitourinary: Negative.   Musculoskeletal: Positive for back pain and joint pain.       Right knee pain  Skin: Negative.    Neurological: Negative.   Endo/Heme/Allergies: Negative.   Psychiatric/Behavioral: Negative.     Objective:  Physical Exam  Constitutional: She is oriented to person, place, and time. She appears well-developed and well-nourished.  HENT:  Head: Normocephalic and atraumatic.  Mouth/Throat: Oropharynx is clear and moist.  Eyes: Conjunctivae are normal. Pupils are equal, round, and reactive to light.  Neck: Neck supple.  Cardiovascular: Normal rate, regular rhythm and normal heart sounds.   Respiratory: Effort normal and breath sounds normal.  GI: Soft. Bowel sounds are normal.  Genitourinary:  Not pertinent to current symptomatology therefore not examined.  Musculoskeletal:  Examination of her right knee reveals pain medially and laterally, 1+ crepitation, 1+ synovitis. Range of motion is from -5 to 125 degrees.  Her knee is stable with normal patellar tracking and diffuse pain.  Examination of her left knee reveals well healed total knee incision without swelling or pain.  Full range of motion. Her knee is stable with normal patellar tracking.  Vascular exam pulses 2+ and symmetric.    Neurological: She is alert and oriented to person, place, and time.  Skin: Skin is warm and dry.  Psychiatric: She has a normal mood and affect. Her behavior is normal.    Vital signs in last 24 hours:    Labs:   Estimated body mass index is 45.38 kg/(m^2) as calculated from the following:   Height as of 03/26/13: 5\' 2"  (1.575 m).   Weight as of 03/26/13: 112.583 kg (248 lb 3.2 oz).   Imaging Review Plain radiographs demonstrate severe degenerative joint disease of the right knee(s). The overall alignment issignificant varus. The bone quality appears to be good for age and reported activity level.  Assessment/Plan:  End stage arthritis, right knee  Active Problems:   Primary localized osteoarthritis of right knee   Arthritis   Anemia   GERD (gastroesophageal reflux disease)   Colon polyp    HOH (hard of hearing)   The patient history, physical examination, clinical judgment of the provider and imaging studies are consistent with end stage degenerative joint disease of the right knee(s) and total knee arthroplasty is deemed medically necessary. The treatment options including medical management, injection therapy arthroscopy and arthroplasty were discussed at length. The risks and benefits of total knee arthroplasty were presented and reviewed. The risks due to aseptic loosening, infection, stiffness, patella tracking problems, thromboembolic complications and other imponderables were discussed. The patient acknowledged the explanation, agreed to proceed with the plan and consent was signed. Patient is being admitted for inpatient treatment for surgery, pain control, PT, OT, prophylactic antibiotics, VTE prophylaxis, progressive ambulation and ADL's and discharge planning. The patient is planning to be discharged home with home health services and her son.  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  Will try to reach the patient to start Mupirocin and Hibiclens.  Will use  antibiotics in the cement and Vancomycin preop  Abnormal EKG will discuss with anesthesia and Dr Catalina Lunger A. Kaleen Mask Physician Assistant [facility] [phone]  06/22/2014, 1:52 PM

## 2014-06-22 ENCOUNTER — Encounter (HOSPITAL_COMMUNITY): Payer: Self-pay

## 2014-06-22 ENCOUNTER — Ambulatory Visit (HOSPITAL_COMMUNITY)
Admission: RE | Admit: 2014-06-22 | Discharge: 2014-06-22 | Disposition: A | Discharge status: Home / Self Care | Payer: Medicare Other | Source: Ambulatory Visit | Attending: Anesthesiology | Admitting: Anesthesiology

## 2014-06-22 ENCOUNTER — Encounter (HOSPITAL_COMMUNITY)
Admission: RE | Admit: 2014-06-22 | Discharge: 2014-06-22 | Disposition: A | Discharge status: Home / Self Care | Payer: Medicare Other | Source: Ambulatory Visit | Attending: Orthopedic Surgery | Admitting: Orthopedic Surgery

## 2014-06-22 DIAGNOSIS — R509 Fever, unspecified: Secondary | ICD-10-CM | POA: Insufficient documentation

## 2014-06-22 DIAGNOSIS — R0602 Shortness of breath: Secondary | ICD-10-CM | POA: Diagnosis not present

## 2014-06-22 DIAGNOSIS — R918 Other nonspecific abnormal finding of lung field: Secondary | ICD-10-CM | POA: Diagnosis not present

## 2014-06-22 DIAGNOSIS — R05 Cough: Secondary | ICD-10-CM | POA: Diagnosis not present

## 2014-06-22 DIAGNOSIS — R058 Other specified cough: Secondary | ICD-10-CM

## 2014-06-22 DIAGNOSIS — Z01811 Encounter for preprocedural respiratory examination: Secondary | ICD-10-CM

## 2014-06-22 HISTORY — DX: Cough: R05

## 2014-06-22 HISTORY — DX: Other specified cough: R05.8

## 2014-06-22 HISTORY — DX: Personal history of other diseases of the circulatory system: Z86.79

## 2014-06-22 LAB — URINALYSIS, ROUTINE W REFLEX MICROSCOPIC
Bilirubin Urine: NEGATIVE
Glucose, UA: NEGATIVE mg/dL
Ketones, ur: NEGATIVE mg/dL
Leukocytes, UA: NEGATIVE
Nitrite: NEGATIVE
Protein, ur: NEGATIVE mg/dL
Specific Gravity, Urine: 1.016 (ref 1.005–1.030)
Urobilinogen, UA: 0.2 mg/dL (ref 0.0–1.0)
pH: 6.5 (ref 5.0–8.0)

## 2014-06-22 LAB — COMPREHENSIVE METABOLIC PANEL
ALT: 14 U/L (ref 14–54)
AST: 16 U/L (ref 15–41)
Albumin: 3.6 g/dL (ref 3.5–5.0)
Alkaline Phosphatase: 87 U/L (ref 38–126)
Anion gap: 9 (ref 5–15)
BUN: 7 mg/dL (ref 6–20)
CO2: 25 mmol/L (ref 22–32)
Calcium: 9.1 mg/dL (ref 8.9–10.3)
Chloride: 103 mmol/L (ref 101–111)
Creatinine, Ser: 0.66 mg/dL (ref 0.44–1.00)
GFR calc Af Amer: >60 mL/min (ref >60)
GFR calc non Af Amer: >60 mL/min (ref >60)
Glucose, Bld: 95 mg/dL (ref 70–99)
Potassium: 3.6 mmol/L (ref 3.5–5.1)
Sodium: 137 mmol/L (ref 135–145)
Total Bilirubin: 0.5 mg/dL (ref 0.3–1.2)
Total Protein: 8 g/dL (ref 6.5–8.1)

## 2014-06-22 LAB — CBC WITH DIFFERENTIAL/PLATELET
Basophils Absolute: 0 10*3/uL (ref 0.0–0.1)
Basophils Relative: 0 % (ref 0–1)
Eosinophils Absolute: 0.1 10*3/uL (ref 0.0–0.7)
Eosinophils Relative: 2 % (ref 0–5)
HCT: 37.7 % (ref 36.0–46.0)
Hemoglobin: 12.2 g/dL (ref 12.0–15.0)
Lymphocytes Relative: 42 % (ref 12–46)
Lymphs Abs: 2.1 10*3/uL (ref 0.7–4.0)
MCH: 27.7 pg (ref 26.0–34.0)
MCHC: 32.4 g/dL (ref 30.0–36.0)
MCV: 85.7 fL (ref 78.0–100.0)
Monocytes Absolute: 0.4 10*3/uL (ref 0.1–1.0)
Monocytes Relative: 7 % (ref 3–12)
Neutro Abs: 2.5 10*3/uL (ref 1.7–7.7)
Neutrophils Relative %: 49 % (ref 43–77)
Platelets: 307 10*3/uL (ref 150–400)
RBC: 4.4 MIL/uL (ref 3.87–5.11)
RDW: 14.5 % (ref 11.5–15.5)
WBC: 5.1 10*3/uL (ref 4.0–10.5)

## 2014-06-22 LAB — TYPE AND SCREEN
ABO/RH(D): B POS
Antibody Screen: NEGATIVE

## 2014-06-22 LAB — URINE MICROSCOPIC-ADD ON

## 2014-06-22 LAB — PROTIME-INR
INR: 1.28 (ref 0.00–1.49)
Prothrombin Time: 16.1 seconds — ABNORMAL HIGH (ref 11.6–15.2)

## 2014-06-22 LAB — SURGICAL PCR SCREEN
MRSA, PCR: POSITIVE — AB
Staphylococcus aureus: POSITIVE — AB

## 2014-06-22 LAB — APTT: aPTT: 33 seconds (ref 24–37)

## 2014-06-22 NOTE — Progress Notes (Signed)
Patient notified of positive PCR screen for MRSA. States Dr Noemi Chapel office called her and will call in rx for Mupirocin ointment.

## 2014-06-22 NOTE — Pre-Procedure Instructions (Signed)
[name]  06/22/2014   Your procedure is scheduled on:  Monday, May 16 @ 0715 am  Report to [facility] Admitting at 0530 AM.  Call this number if you have problems the morning of surgery: [phone]   Remember:   Do not eat food or drink liquids after midnight. Sunday night   Take these medicines the morning of surgery with A SIP OF WATER: Protonix, oxycodone if needed for pain  Stop aspirin, ibuprofen, advil, blood thinners including fish oil, herbal supplements and teas.   Do not wear jewelry, make-up or nail polish.  Do not wear lotions, powders, or perfumes. Do not wear deodorant.  Do not shave 48 hours prior to surgery.    Do not bring valuables to the hospital.  [facility] is not responsible  for any belongings or valuables.               Contacts, dentures or bridgework may not be worn into surgery.  Leave suitcase in the car. After surgery it may be brought to your room.  For patients admitted to the hospital, discharge time is determined by your   treatment team.      Special Instructions: [facility] - Preparing for Surgery  Before surgery, you can play an important role.  Because skin is not sterile, your skin needs to be as free of germs as possible.  You can reduce the number of germs on you skin by washing with CHG (chlorahexidine gluconate) soap before surgery.  CHG is an antiseptic cleaner which kills germs and bonds with the skin to continue killing germs even after washing.  Please DO NOT use if you have an allergy to CHG or antibacterial soaps.  If your skin becomes reddened/irritated stop using the CHG and inform your nurse when you arrive at Short Stay.  Do not shave (including legs and underarms) for at least 48 hours prior to the first CHG shower.  You may shave your face.  Please follow these instructions carefully:   1.  Shower with CHG Soap the night before surgery and the    morning of Surgery.  2.  If you choose to wash your hair,  wash your hair first as usual with your   normal shampoo.  3.  After you shampoo, rinse your hair and body thoroughly to remove the  Shampoo.  4.  Use CHG as you would any other liquid soap.  You can apply chg directly   to the skin and wash gently with scrungie or a clean washcloth.  5.  Apply the CHG Soap to your body ONLY FROM THE NECK DOWN. Do not use on open wounds or open sores.  Avoid contact with your eyes,   ears, mouth and genitals (private parts).  Wash genitals (private parts)   with your normal soap.  6.  Wash thoroughly, paying special attention to the area where your surgery   will be performed.  7.  Thoroughly rinse your body with warm water from the neck down.  8.  DO NOT shower/wash with your normal soap after using and rinsing off  the CHG Soap.  9.  Pat yourself dry with a clean towel.            10.  Wear clean pajamas.            11 .  Place clean sheets on your bed the night of your first shower and do not   sleep with pets.  Day of Surgery  Do not apply any lotions/deoderants the morning of surgery.  Please wear clean clothes to the hospital/surgery center.     Please read over the following fact sheets that you were given: Pain Booklet, Coughing and Deep Breathing, Blood Transfusion Information, Total Joint Packet and Surgical Site Infection Prevention

## 2014-06-22 NOTE — Progress Notes (Signed)
   06/22/14 1026  OBSTRUCTIVE SLEEP APNEA  Have you ever been diagnosed with sleep apnea through a sleep study? No (had a negative sleep study 5 years ago)  Do you snore loudly (loud enough to be heard through closed doors)?  0  Do you often feel tired, fatigued, or sleepy during the daytime? 0  Has anyone observed you stop breathing during your sleep? 0  Do you have, or are you being treated for high blood pressure? 0  BMI more than 35 kg/m2? 1  Age over 61 years old? 1  Neck circumference greater than 40 cm/16 inches? 0  Gender: 0

## 2014-06-23 LAB — URINE CULTURE
Colony Count: NO GROWTH
Culture: NO GROWTH

## 2014-06-23 NOTE — Progress Notes (Addendum)
Anesthesia Chart Review:  Pt is 69 year old female scheduled for R total knee arthroplasty on 07/04/2014 with Dr. Noemi Chapel.   PMH includes: RBBB, anemia, GERD. Former smoker. BMI 45. S/p laparoscopic cholecystectomy 03/09/13. S/p L total knee arthroplasty 04/29/11.   Medications include: lasix, protonix, ibuprofen.   Preoperative labs reviewed.  PT 16.1. Will repeat DOS.   Chest x-ray reviewed. Mild bilateral basilar predominant interstitial and streaky pulmonary opacity which could reflect viral or atypical respiratory infection in this setting. No pleural effusion.  Pt had productive cough at PAT on 06/22/14. Spoke with Sherri in Dr. Archie Endo office. Pt has appt with PCP 06/24/14 to be evaluated.   EKG: Sinus rhythm with sinus arrhythmia with occasional PVCs. RBBB. Minimal voltage criteria for LVH, may be normal variant. T wave abnormality, consider inferior ischemia. No significant change since last tracing 03/08/2013.  If pt's PCP feels she is acceptable for surgery, I anticipate she can proceed as scheduled.   Willeen Cass, FNP-BC [facility] Stay [facility]/Anesthesiology Phone: [phone] 06/23/2014 4:30 PM

## 2014-06-24 DIAGNOSIS — J302 Other seasonal allergic rhinitis: Secondary | ICD-10-CM | POA: Diagnosis not present

## 2014-06-24 DIAGNOSIS — R42 Dizziness and giddiness: Secondary | ICD-10-CM | POA: Diagnosis not present

## 2014-06-24 DIAGNOSIS — M12869 Other specific arthropathies, not elsewhere classified, unspecified knee: Secondary | ICD-10-CM | POA: Diagnosis not present

## 2014-06-24 DIAGNOSIS — Z131 Encounter for screening for diabetes mellitus: Secondary | ICD-10-CM | POA: Diagnosis not present

## 2014-06-24 DIAGNOSIS — R030 Elevated blood-pressure reading, without diagnosis of hypertension: Secondary | ICD-10-CM | POA: Diagnosis not present

## 2014-06-24 DIAGNOSIS — Z13220 Encounter for screening for lipoid disorders: Secondary | ICD-10-CM | POA: Diagnosis not present

## 2014-06-30 DIAGNOSIS — M12869 Other specific arthropathies, not elsewhere classified, unspecified knee: Secondary | ICD-10-CM | POA: Diagnosis not present

## 2014-06-30 DIAGNOSIS — K219 Gastro-esophageal reflux disease without esophagitis: Secondary | ICD-10-CM | POA: Diagnosis not present

## 2014-06-30 DIAGNOSIS — J302 Other seasonal allergic rhinitis: Secondary | ICD-10-CM | POA: Diagnosis not present

## 2014-07-04 ENCOUNTER — Encounter (HOSPITAL_COMMUNITY): Payer: Self-pay | Admitting: *Deleted

## 2014-07-04 ENCOUNTER — Inpatient Hospital Stay (HOSPITAL_COMMUNITY): Payer: Medicare Other | Admitting: Emergency Medicine

## 2014-07-04 ENCOUNTER — Inpatient Hospital Stay (HOSPITAL_COMMUNITY)
Admission: RE | Admit: 2014-07-04 | Discharge: 2014-07-06 | DRG: 470 | Disposition: A | Discharge status: Home Health | Payer: Medicare Other | Source: Ambulatory Visit | Attending: Orthopedic Surgery | Admitting: Orthopedic Surgery

## 2014-07-04 ENCOUNTER — Encounter (HOSPITAL_COMMUNITY)
Admission: RE | Disposition: A | Discharge status: Home Health | Payer: Self-pay | Source: Ambulatory Visit | Attending: Orthopedic Surgery

## 2014-07-04 ENCOUNTER — Inpatient Hospital Stay (HOSPITAL_COMMUNITY): Payer: Medicare Other | Admitting: Anesthesiology

## 2014-07-04 DIAGNOSIS — Z820 Family history of epilepsy and other diseases of the nervous system: Secondary | ICD-10-CM

## 2014-07-04 DIAGNOSIS — K635 Polyp of colon: Secondary | ICD-10-CM | POA: Diagnosis present

## 2014-07-04 DIAGNOSIS — H9190 Unspecified hearing loss, unspecified ear: Secondary | ICD-10-CM | POA: Diagnosis not present

## 2014-07-04 DIAGNOSIS — Z7902 Long term (current) use of antithrombotics/antiplatelets: Secondary | ICD-10-CM | POA: Diagnosis not present

## 2014-07-04 DIAGNOSIS — J069 Acute upper respiratory infection, unspecified: Secondary | ICD-10-CM | POA: Diagnosis present

## 2014-07-04 DIAGNOSIS — Z87891 Personal history of nicotine dependence: Secondary | ICD-10-CM

## 2014-07-04 DIAGNOSIS — M1710 Unilateral primary osteoarthritis, unspecified knee: Secondary | ICD-10-CM | POA: Diagnosis present

## 2014-07-04 DIAGNOSIS — M1990 Unspecified osteoarthritis, unspecified site: Secondary | ICD-10-CM | POA: Diagnosis present

## 2014-07-04 DIAGNOSIS — Z833 Family history of diabetes mellitus: Secondary | ICD-10-CM

## 2014-07-04 DIAGNOSIS — D649 Anemia, unspecified: Secondary | ICD-10-CM | POA: Diagnosis present

## 2014-07-04 DIAGNOSIS — Z22322 Carrier or suspected carrier of Methicillin resistant Staphylococcus aureus: Secondary | ICD-10-CM | POA: Diagnosis not present

## 2014-07-04 DIAGNOSIS — M1711 Unilateral primary osteoarthritis, right knee: Secondary | ICD-10-CM | POA: Diagnosis not present

## 2014-07-04 DIAGNOSIS — Z6841 Body Mass Index (BMI) 40.0 and over, adult: Secondary | ICD-10-CM

## 2014-07-04 DIAGNOSIS — Z8249 Family history of ischemic heart disease and other diseases of the circulatory system: Secondary | ICD-10-CM | POA: Diagnosis not present

## 2014-07-04 DIAGNOSIS — Z79899 Other long term (current) drug therapy: Secondary | ICD-10-CM | POA: Diagnosis not present

## 2014-07-04 DIAGNOSIS — M179 Osteoarthritis of knee, unspecified: Secondary | ICD-10-CM | POA: Diagnosis present

## 2014-07-04 DIAGNOSIS — M25561 Pain in right knee: Secondary | ICD-10-CM | POA: Diagnosis present

## 2014-07-04 DIAGNOSIS — G8918 Other acute postprocedural pain: Secondary | ICD-10-CM | POA: Diagnosis not present

## 2014-07-04 DIAGNOSIS — K219 Gastro-esophageal reflux disease without esophagitis: Secondary | ICD-10-CM | POA: Diagnosis present

## 2014-07-04 DIAGNOSIS — Z96652 Presence of left artificial knee joint: Secondary | ICD-10-CM | POA: Diagnosis not present

## 2014-07-04 DIAGNOSIS — Z823 Family history of stroke: Secondary | ICD-10-CM | POA: Diagnosis not present

## 2014-07-04 DIAGNOSIS — R05 Cough: Secondary | ICD-10-CM | POA: Diagnosis not present

## 2014-07-04 DIAGNOSIS — Z96651 Presence of right artificial knee joint: Secondary | ICD-10-CM | POA: Diagnosis not present

## 2014-07-04 HISTORY — DX: Unilateral primary osteoarthritis, right knee: M17.11

## 2014-07-04 HISTORY — DX: Acute upper respiratory infection, unspecified: J06.9

## 2014-07-04 HISTORY — PX: TOTAL KNEE ARTHROPLASTY: SHX125

## 2014-07-04 LAB — PROTIME-INR
INR: 1.25 (ref 0.00–1.49)
Prothrombin Time: 15.8 seconds — ABNORMAL HIGH (ref 11.6–15.2)

## 2014-07-04 SURGERY — ARTHROPLASTY, KNEE, TOTAL
Anesthesia: General | Laterality: Right

## 2014-07-04 MED ORDER — DIPHENHYDRAMINE HCL 12.5 MG/5ML PO ELIX: 12.5000 mg | ORAL_SOLUTION | ORAL | Status: DC | PRN

## 2014-07-04 MED ORDER — POVIDONE-IODINE 7.5 % EX SOLN
Freq: Once | CUTANEOUS | Status: DC
  Filled 2014-07-04: qty 118

## 2014-07-04 MED ORDER — OXYCODONE HCL 5 MG PO TABS
5.0000 mg | ORAL_TABLET | ORAL | Status: DC | PRN
  Administered 2014-07-04 – 2014-07-06 (×8): 10 mg via ORAL
  Filled 2014-07-04 (×8): qty 2

## 2014-07-04 MED ORDER — DEXAMETHASONE SODIUM PHOSPHATE 10 MG/ML IJ SOLN
10.0000 mg | Freq: Three times a day (TID) | INTRAMUSCULAR | Status: DC
  Administered 2014-07-04 – 2014-07-06 (×5): 10 mg via INTRAVENOUS
  Filled 2014-07-04 (×5): qty 1

## 2014-07-04 MED ORDER — OXYCODONE HCL 5 MG PO TABS
ORAL_TABLET | ORAL | Status: AC
  Filled 2014-07-04: qty 2

## 2014-07-04 MED ORDER — SODIUM CHLORIDE 0.9 % IR SOLN
Status: DC | PRN
Start: 2014-07-04 — End: 2014-07-04
  Administered 2014-07-04: 3000 mL
  Administered 2014-07-04: 1000 mL

## 2014-07-04 MED ORDER — ALUM & MAG HYDROXIDE-SIMETH 200-200-20 MG/5ML PO SUSP: 30.0000 mL | ORAL | Status: DC | PRN

## 2014-07-04 MED ORDER — CELECOXIB 200 MG PO CAPS: 200.0000 mg | ORAL_CAPSULE | Freq: Two times a day (BID) | ORAL | Status: DC

## 2014-07-04 MED ORDER — ACETAMINOPHEN 650 MG RE SUPP: 650.0000 mg | Freq: Four times a day (QID) | RECTAL | Status: DC | PRN

## 2014-07-04 MED ORDER — ONDANSETRON HCL 4 MG PO TABS: 4.0000 mg | ORAL_TABLET | Freq: Four times a day (QID) | ORAL | Status: DC | PRN

## 2014-07-04 MED ORDER — PROPOFOL 10 MG/ML IV BOLUS
INTRAVENOUS | Status: DC | PRN
  Administered 2014-07-04: 120 mg via INTRAVENOUS

## 2014-07-04 MED ORDER — MIDAZOLAM HCL 5 MG/5ML IJ SOLN
INTRAMUSCULAR | Status: DC | PRN
  Administered 2014-07-04: 1 mg via INTRAVENOUS

## 2014-07-04 MED ORDER — CEFAZOLIN SODIUM-DEXTROSE 2-3 GM-% IV SOLR: 2.0000 g | INTRAVENOUS | Status: DC

## 2014-07-04 MED ORDER — DEXAMETHASONE SODIUM PHOSPHATE 10 MG/ML IJ SOLN
INTRAMUSCULAR | Status: DC | PRN
  Administered 2014-07-04: 10 mg via INTRAVENOUS

## 2014-07-04 MED ORDER — HYDROMORPHONE HCL 1 MG/ML IJ SOLN
0.5000 mg | INTRAMUSCULAR | Status: DC | PRN
  Administered 2014-07-04: 0.5 mg via INTRAVENOUS

## 2014-07-04 MED ORDER — BUPIVACAINE-EPINEPHRINE (PF) 0.25% -1:200000 IJ SOLN
INTRAMUSCULAR | Status: AC
  Filled 2014-07-04: qty 30

## 2014-07-04 MED ORDER — BUPIVACAINE-EPINEPHRINE 0.25% -1:200000 IJ SOLN
INTRAMUSCULAR | Status: DC | PRN
  Administered 2014-07-04: 30 mL

## 2014-07-04 MED ORDER — PHENOL 1.4 % MT LIQD: 1.0000 | OROMUCOSAL | Status: DC | PRN

## 2014-07-04 MED ORDER — HYDROMORPHONE HCL 1 MG/ML IJ SOLN
1.0000 mg | INTRAMUSCULAR | Status: DC | PRN
  Administered 2014-07-04: 1 mg via INTRAVENOUS
  Filled 2014-07-04: qty 1

## 2014-07-04 MED ORDER — VANCOMYCIN HCL 10 G IV SOLR
1500.0000 mg | INTRAVENOUS | Status: AC
  Administered 2014-07-04: 1500 mg via INTRAVENOUS
  Filled 2014-07-04: qty 1500

## 2014-07-04 MED ORDER — CEFAZOLIN SODIUM-DEXTROSE 2-3 GM-% IV SOLR
2.0000 g | Freq: Four times a day (QID) | INTRAVENOUS | Status: AC
  Administered 2014-07-05: 2 g via INTRAVENOUS
  Filled 2014-07-04: qty 50

## 2014-07-04 MED ORDER — HYDROMORPHONE HCL 1 MG/ML IJ SOLN
INTRAMUSCULAR | Status: AC
  Filled 2014-07-04: qty 1

## 2014-07-04 MED ORDER — DEXAMETHASONE SODIUM PHOSPHATE 10 MG/ML IJ SOLN: 10.0000 mg | Freq: Three times a day (TID) | INTRAMUSCULAR | Status: DC

## 2014-07-04 MED ORDER — HYDROMORPHONE HCL 1 MG/ML IJ SOLN
0.2500 mg | INTRAMUSCULAR | Status: DC | PRN
  Administered 2014-07-04 (×4): 0.5 mg via INTRAVENOUS

## 2014-07-04 MED ORDER — ONDANSETRON HCL 4 MG/2ML IJ SOLN
INTRAMUSCULAR | Status: DC | PRN
  Administered 2014-07-04: 4 mg via INTRAVENOUS

## 2014-07-04 MED ORDER — POTASSIUM CHLORIDE IN NACL 20-0.9 MEQ/L-% IV SOLN
INTRAVENOUS | Status: DC
  Administered 2014-07-04: 15:00:00 via INTRAVENOUS
  Filled 2014-07-04 (×3): qty 1000

## 2014-07-04 MED ORDER — ACETAMINOPHEN 325 MG PO TABS
650.0000 mg | ORAL_TABLET | Freq: Four times a day (QID) | ORAL | Status: DC | PRN
  Administered 2014-07-04: 650 mg via ORAL
  Filled 2014-07-04: qty 2

## 2014-07-04 MED ORDER — APIXABAN 2.5 MG PO TABS
2.5000 mg | ORAL_TABLET | Freq: Two times a day (BID) | ORAL | Status: DC
  Administered 2014-07-05 – 2014-07-06 (×3): 2.5 mg via ORAL
  Filled 2014-07-04 (×3): qty 1

## 2014-07-04 MED ORDER — LACTATED RINGERS IV SOLN: INTRAVENOUS | Status: DC

## 2014-07-04 MED ORDER — VANCOMYCIN HCL IN DEXTROSE 1-5 GM/200ML-% IV SOLN
1000.0000 mg | Freq: Two times a day (BID) | INTRAVENOUS | Status: AC
  Administered 2014-07-04: 1000 mg via INTRAVENOUS
  Filled 2014-07-04: qty 200

## 2014-07-04 MED ORDER — MEPERIDINE HCL 25 MG/ML IJ SOLN: 6.2500 mg | INTRAMUSCULAR | Status: DC | PRN

## 2014-07-04 MED ORDER — GLYCOPYRROLATE 0.2 MG/ML IJ SOLN
INTRAMUSCULAR | Status: AC
  Filled 2014-07-04: qty 2

## 2014-07-04 MED ORDER — FUROSEMIDE 20 MG PO TABS: 20.0000 mg | ORAL_TABLET | Freq: Every day | ORAL | Status: DC | PRN

## 2014-07-04 MED ORDER — CELECOXIB 200 MG PO CAPS
200.0000 mg | ORAL_CAPSULE | Freq: Two times a day (BID) | ORAL | Status: DC
  Filled 2014-07-04 (×5): qty 1

## 2014-07-04 MED ORDER — MENTHOL 3 MG MT LOZG: 1.0000 | LOZENGE | OROMUCOSAL | Status: DC | PRN

## 2014-07-04 MED ORDER — APIXABAN 2.5 MG PO TABS: 2.5000 mg | ORAL_TABLET | Freq: Two times a day (BID) | ORAL | Status: DC

## 2014-07-04 MED ORDER — DEXAMETHASONE SODIUM PHOSPHATE 10 MG/ML IJ SOLN
INTRAMUSCULAR | Status: AC
  Filled 2014-07-04: qty 1

## 2014-07-04 MED ORDER — POTASSIUM CHLORIDE IN NACL 20-0.9 MEQ/L-% IV SOLN
INTRAVENOUS | Status: DC
  Administered 2014-07-05: 02:00:00 via INTRAVENOUS
  Filled 2014-07-04 (×6): qty 1000

## 2014-07-04 MED ORDER — NEOSTIGMINE METHYLSULFATE 10 MG/10ML IV SOLN
INTRAVENOUS | Status: AC
  Filled 2014-07-04: qty 1

## 2014-07-04 MED ORDER — OXYCODONE HCL 5 MG PO TABS: 5.0000 mg | ORAL_TABLET | ORAL | Status: DC | PRN

## 2014-07-04 MED ORDER — PANTOPRAZOLE SODIUM 40 MG PO TBEC
40.0000 mg | DELAYED_RELEASE_TABLET | Freq: Every day | ORAL | Status: DC
Start: 2014-07-04 — End: 2014-07-06
  Administered 2014-07-05 – 2014-07-06 (×2): 40 mg via ORAL
  Filled 2014-07-04 (×2): qty 1

## 2014-07-04 MED ORDER — LACTATED RINGERS IV SOLN
INTRAVENOUS | Status: DC | PRN
  Administered 2014-07-04 (×2): via INTRAVENOUS

## 2014-07-04 MED ORDER — CEFUROXIME SODIUM 1.5 G IJ SOLR
INTRAMUSCULAR | Status: DC | PRN
  Administered 2014-07-04: 1.5 g

## 2014-07-04 MED ORDER — DIPHENHYDRAMINE HCL 50 MG/ML IJ SOLN
INTRAMUSCULAR | Status: DC | PRN
  Administered 2014-07-04: 12.5 mg via INTRAVENOUS

## 2014-07-04 MED ORDER — CHLORHEXIDINE GLUCONATE 4 % EX LIQD
60.0000 mL | Freq: Once | CUTANEOUS | Status: DC
Start: 2014-07-04 — End: 2014-07-04
  Filled 2014-07-04: qty 60

## 2014-07-04 MED ORDER — PROMETHAZINE HCL 25 MG/ML IJ SOLN: 6.2500 mg | INTRAMUSCULAR | Status: DC | PRN

## 2014-07-04 MED ORDER — ROPIVACAINE HCL 5 MG/ML IJ SOLN
INTRAMUSCULAR | Status: DC | PRN
  Administered 2014-07-04: 30 mL via PERINEURAL

## 2014-07-04 MED ORDER — HYDROMORPHONE HCL 1 MG/ML IJ SOLN: 1.0000 mg | INTRAMUSCULAR | Status: DC | PRN

## 2014-07-04 MED ORDER — CEFAZOLIN SODIUM-DEXTROSE 2-3 GM-% IV SOLR
2.0000 g | Freq: Four times a day (QID) | INTRAVENOUS | Status: DC
  Administered 2014-07-04 (×2): 2 g via INTRAVENOUS
  Filled 2014-07-04 (×3): qty 50

## 2014-07-04 MED ORDER — DOCUSATE SODIUM 100 MG PO CAPS: 100.0000 mg | ORAL_CAPSULE | Freq: Two times a day (BID) | ORAL | Status: DC

## 2014-07-04 MED ORDER — CEFUROXIME SODIUM 1.5 G IJ SOLR
INTRAMUSCULAR | Status: AC
  Filled 2014-07-04: qty 1.5

## 2014-07-04 MED ORDER — METOCLOPRAMIDE HCL 5 MG PO TABS: 5.0000 mg | ORAL_TABLET | Freq: Three times a day (TID) | ORAL | Status: DC | PRN

## 2014-07-04 MED ORDER — POLYETHYLENE GLYCOL 3350 17 G PO PACK: 17.0000 g | PACK | Freq: Two times a day (BID) | ORAL | Status: DC

## 2014-07-04 MED ORDER — SUCCINYLCHOLINE CHLORIDE 20 MG/ML IJ SOLN
INTRAMUSCULAR | Status: AC
  Filled 2014-07-04: qty 1

## 2014-07-04 MED ORDER — ONDANSETRON HCL 4 MG/2ML IJ SOLN
INTRAMUSCULAR | Status: AC
  Filled 2014-07-04: qty 2

## 2014-07-04 MED ORDER — CEFAZOLIN SODIUM-DEXTROSE 2-3 GM-% IV SOLR
INTRAVENOUS | Status: AC
  Administered 2014-07-04: 2 g via INTRAVENOUS
  Filled 2014-07-04: qty 50

## 2014-07-04 MED ORDER — ROCURONIUM BROMIDE 50 MG/5ML IV SOLN
INTRAVENOUS | Status: AC
  Filled 2014-07-04: qty 1

## 2014-07-04 MED ORDER — DOCUSATE SODIUM 100 MG PO CAPS
100.0000 mg | ORAL_CAPSULE | Freq: Two times a day (BID) | ORAL | Status: DC
  Administered 2014-07-04 – 2014-07-06 (×4): 100 mg via ORAL
  Filled 2014-07-04 (×4): qty 1

## 2014-07-04 MED ORDER — ONDANSETRON HCL 4 MG/2ML IJ SOLN: 4.0000 mg | Freq: Four times a day (QID) | INTRAMUSCULAR | Status: DC | PRN

## 2014-07-04 MED ORDER — MIDAZOLAM HCL 2 MG/2ML IJ SOLN
0.5000 mg | Freq: Once | INTRAMUSCULAR | Status: DC | PRN
Start: 2014-07-04 — End: 2014-07-04

## 2014-07-04 MED ORDER — SUGAMMADEX SODIUM 200 MG/2ML IV SOLN
2.0000 mg/kg | INTRAVENOUS | Status: DC
  Filled 2014-07-04: qty 2.4

## 2014-07-04 MED ORDER — SUGAMMADEX SODIUM 200 MG/2ML IV SOLN
INTRAVENOUS | Status: DC | PRN
  Administered 2014-07-04: 235.8 mg via INTRAVENOUS

## 2014-07-04 MED ORDER — LIDOCAINE HCL (CARDIAC) 20 MG/ML IV SOLN
INTRAVENOUS | Status: AC
  Filled 2014-07-04: qty 5

## 2014-07-04 MED ORDER — SODIUM CHLORIDE 0.9 % IJ SOLN
INTRAMUSCULAR | Status: AC
  Filled 2014-07-04: qty 10

## 2014-07-04 MED ORDER — METOCLOPRAMIDE HCL 5 MG/ML IJ SOLN: 5.0000 mg | Freq: Three times a day (TID) | INTRAMUSCULAR | Status: DC | PRN

## 2014-07-04 MED ORDER — METOCLOPRAMIDE HCL 5 MG PO TABS
5.0000 mg | ORAL_TABLET | Freq: Three times a day (TID) | ORAL | Status: DC | PRN
Start: 2014-07-04 — End: 2014-07-06

## 2014-07-04 MED ORDER — MIDAZOLAM HCL 2 MG/2ML IJ SOLN
INTRAMUSCULAR | Status: AC
  Filled 2014-07-04: qty 2

## 2014-07-04 MED ORDER — FENTANYL CITRATE (PF) 100 MCG/2ML IJ SOLN
INTRAMUSCULAR | Status: DC | PRN
  Administered 2014-07-04 (×4): 25 ug via INTRAVENOUS
  Administered 2014-07-04: 200 ug via INTRAVENOUS
  Administered 2014-07-04: 50 ug via INTRAVENOUS

## 2014-07-04 MED ORDER — POLYETHYLENE GLYCOL 3350 17 G PO PACK
17.0000 g | PACK | Freq: Two times a day (BID) | ORAL | Status: DC
  Administered 2014-07-04 – 2014-07-05 (×3): 17 g via ORAL
  Filled 2014-07-04 (×4): qty 1

## 2014-07-04 MED ORDER — SODIUM CHLORIDE 0.9 % IV SOLN
INTRAVENOUS | Status: DC | PRN
  Administered 2014-07-04: 07:00:00 via INTRAVENOUS

## 2014-07-04 MED ORDER — VANCOMYCIN HCL IN DEXTROSE 1-5 GM/200ML-% IV SOLN: 1000.0000 mg | Freq: Two times a day (BID) | INTRAVENOUS | Status: DC

## 2014-07-04 MED ORDER — FENTANYL CITRATE (PF) 250 MCG/5ML IJ SOLN
INTRAMUSCULAR | Status: AC
  Filled 2014-07-04: qty 5

## 2014-07-04 MED ORDER — ACETAMINOPHEN 325 MG PO TABS: 650.0000 mg | ORAL_TABLET | Freq: Four times a day (QID) | ORAL | Status: DC | PRN

## 2014-07-04 MED ORDER — HYDROMORPHONE HCL 1 MG/ML IJ SOLN
INTRAMUSCULAR | Status: AC
  Filled 2014-07-04: qty 2

## 2014-07-04 MED ORDER — FENTANYL CITRATE (PF) 250 MCG/5ML IJ SOLN
INTRAMUSCULAR | Status: AC
Start: 2014-07-04 — End: 2014-07-04
  Filled 2014-07-04: qty 5

## 2014-07-04 MED ORDER — ROCURONIUM BROMIDE 100 MG/10ML IV SOLN
INTRAVENOUS | Status: DC | PRN
  Administered 2014-07-04: 50 mg via INTRAVENOUS

## 2014-07-04 MED ORDER — PROPOFOL 10 MG/ML IV BOLUS
INTRAVENOUS | Status: AC
  Filled 2014-07-04: qty 20

## 2014-07-04 SURGICAL SUPPLY — 74 items
APL SKNCLS STERI-STRIP NONHPOA (GAUZE/BANDAGES/DRESSINGS) ×1
BANDAGE ELASTIC 6 VELCRO ST LF (GAUZE/BANDAGES/DRESSINGS) ×1 IMPLANT
BANDAGE ESMARK 6X9 LF (GAUZE/BANDAGES/DRESSINGS) ×1 IMPLANT
BENZOIN TINCTURE PRP APPL 2/3 (GAUZE/BANDAGES/DRESSINGS) ×2 IMPLANT
BLADE SAGITTAL 25.0X1.19X90 (BLADE) ×2 IMPLANT
BLADE SAW SGTL 13.0X1.19X90.0M (BLADE) ×2 IMPLANT
BLADE SURG 10 STRL SS (BLADE) ×4 IMPLANT
BNDG CMPR 9X6 STRL LF SNTH (GAUZE/BANDAGES/DRESSINGS) ×1
BNDG CMPR MED 15X6 ELC VLCR LF (GAUZE/BANDAGES/DRESSINGS) ×2
BNDG ELASTIC 6X15 VLCR STRL LF (GAUZE/BANDAGES/DRESSINGS) ×3 IMPLANT
BNDG ESMARK 6X9 LF (GAUZE/BANDAGES/DRESSINGS) ×2
BOWL SMART MIX CTS (DISPOSABLE) ×2 IMPLANT
CAP KNEE TOTAL 3 SIGMA ×1 IMPLANT
CEMENT HV SMART SET (Cement) ×3 IMPLANT
CLSR STERI-STRIP ANTIMIC 1/2X4 (GAUZE/BANDAGES/DRESSINGS) ×1 IMPLANT
COVER SURGICAL LIGHT HANDLE (MISCELLANEOUS) ×2 IMPLANT
CUFF TOURNIQUET SINGLE 34IN LL (TOURNIQUET CUFF) ×1 IMPLANT
CUFF TOURNIQUET SINGLE 44IN (TOURNIQUET CUFF) ×1 IMPLANT
DRAPE EXTREMITY T 121X128X90 (DRAPE) ×2 IMPLANT
DRAPE IMP U-DRAPE 54X76 (DRAPES) ×2 IMPLANT
DRAPE INCISE IOBAN 66X45 STRL (DRAPES) ×2 IMPLANT
DRAPE PROXIMA HALF (DRAPES) ×2 IMPLANT
DRAPE U-SHAPE 47X51 STRL (DRAPES) ×2 IMPLANT
DRSG AQUACEL AG ADV 3.5X14 (GAUZE/BANDAGES/DRESSINGS) ×2 IMPLANT
DRSG PAD ABDOMINAL 8X10 ST (GAUZE/BANDAGES/DRESSINGS) ×4 IMPLANT
DURAPREP 26ML APPLICATOR (WOUND CARE) ×4 IMPLANT
ELECT CAUTERY BLADE 6.4 (BLADE) ×2 IMPLANT
ELECT REM PT RETURN 9FT ADLT (ELECTROSURGICAL) ×2
ELECTRODE REM PT RTRN 9FT ADLT (ELECTROSURGICAL) ×1 IMPLANT
EVACUATOR 1/8 PVC DRAIN (DRAIN) ×2 IMPLANT
FACESHIELD WRAPAROUND (MASK) ×2 IMPLANT
FACESHIELD WRAPAROUND OR TEAM (MASK) ×1 IMPLANT
GAUZE SPONGE 4X4 12PLY STRL (GAUZE/BANDAGES/DRESSINGS) ×2 IMPLANT
GLOVE BIO SURGEON STRL SZ7 (GLOVE) ×4 IMPLANT
GLOVE BIOGEL PI IND STRL 7.0 (GLOVE) ×1 IMPLANT
GLOVE BIOGEL PI IND STRL 7.5 (GLOVE) ×1 IMPLANT
GLOVE BIOGEL PI INDICATOR 7.0 (GLOVE) ×5
GLOVE BIOGEL PI INDICATOR 7.5 (GLOVE) ×1
GLOVE SS BIOGEL STRL SZ 7.5 (GLOVE) ×1 IMPLANT
GLOVE SUPERSENSE BIOGEL SZ 7.5 (GLOVE) ×1
GOWN STRL REUS W/ TWL LRG LVL3 (GOWN DISPOSABLE) ×2 IMPLANT
GOWN STRL REUS W/ TWL XL LVL3 (GOWN DISPOSABLE) ×2 IMPLANT
GOWN STRL REUS W/TWL LRG LVL3 (GOWN DISPOSABLE) ×4
GOWN STRL REUS W/TWL XL LVL3 (GOWN DISPOSABLE) ×4
HANDPIECE INTERPULSE COAX TIP (DISPOSABLE) ×2
HOOD PEEL AWAY FACE SHEILD DIS (HOOD) ×4 IMPLANT
IMMOBILIZER KNEE 22 (SOFTGOODS) ×1 IMPLANT
IMMOBILIZER KNEE 22 UNIV (SOFTGOODS) IMPLANT
KIT BASIN OR (CUSTOM PROCEDURE TRAY) ×2 IMPLANT
KIT ROOM TURNOVER OR (KITS) ×2 IMPLANT
MANIFOLD NEPTUNE II (INSTRUMENTS) ×2 IMPLANT
MARKER SKIN DUAL TIP RULER LAB (MISCELLANEOUS) ×3 IMPLANT
NS IRRIG 1000ML POUR BTL (IV SOLUTION) ×2 IMPLANT
PACK TOTAL JOINT (CUSTOM PROCEDURE TRAY) ×2 IMPLANT
PACK UNIVERSAL I (CUSTOM PROCEDURE TRAY) ×2 IMPLANT
PAD ABD 8X10 STRL (GAUZE/BANDAGES/DRESSINGS) ×1 IMPLANT
PAD ARMBOARD 7.5X6 YLW CONV (MISCELLANEOUS) ×4 IMPLANT
PADDING CAST COTTON 6X4 STRL (CAST SUPPLIES) ×2 IMPLANT
RUBBERBAND STERILE (MISCELLANEOUS) ×2 IMPLANT
SET HNDPC FAN SPRY TIP SCT (DISPOSABLE) ×1 IMPLANT
SPONGE GAUZE 4X4 12PLY STER LF (GAUZE/BANDAGES/DRESSINGS) ×1 IMPLANT
STRIP CLOSURE SKIN 1/2X4 (GAUZE/BANDAGES/DRESSINGS) ×2 IMPLANT
SUCTION FRAZIER TIP 10 FR DISP (SUCTIONS) ×2 IMPLANT
SUT ETHIBOND NAB CT1 #1 30IN (SUTURE) ×4 IMPLANT
SUT MNCRL AB 3-0 PS2 18 (SUTURE) ×2 IMPLANT
SUT VIC AB 0 CT1 27 (SUTURE) ×8
SUT VIC AB 0 CT1 27XBRD ANBCTR (SUTURE) ×2 IMPLANT
SUT VIC AB 2-0 CT1 27 (SUTURE) ×4
SUT VIC AB 2-0 CT1 TAPERPNT 27 (SUTURE) ×2 IMPLANT
SYR 30ML SLIP (SYRINGE) ×2 IMPLANT
TOWEL OR 17X24 6PK STRL BLUE (TOWEL DISPOSABLE) ×2 IMPLANT
TOWEL OR 17X26 10 PK STRL BLUE (TOWEL DISPOSABLE) ×2 IMPLANT
TRAY FOLEY CATH 16FR SILVER (SET/KITS/TRAYS/PACK) ×2 IMPLANT
WATER STERILE IRR 1000ML POUR (IV SOLUTION) ×4 IMPLANT

## 2014-07-04 NOTE — Evaluation (Signed)
Physical Therapy Evaluation Patient Details Name: [name] MRN: [id number] DOB: 08-11-1945 Today's Date: 07/04/2014   History of Present Illness  Pt is a 69 y/o F s/p R TKA.  Pt's PMH includes L knee DJD, anemia, GERD, HOH, and RBBB.  Clinical Impression  Pt is s/p R TKA resulting in the deficits listed below (see PT Problem List). Pt is very motivated and ambulated 20 ft in room today w/ RW.  Pt will benefit from skilled PT to increase their independence and safety with mobility to allow discharge to the venue listed below.     Follow Up Recommendations Home health PT;Supervision/Assistance - 24 hour    Equipment Recommendations  None recommended by PT    Recommendations for Other Services OT consult     Precautions / Restrictions Precautions Precautions: Fall;Knee Precaution Booklet Issued: Yes (comment) Precaution Comments: Reviewed no pillow under knee Required Braces or Orthoses: Knee Immobilizer - Right Knee Immobilizer - Right: Discontinue once straight leg raise with < 10 degree lag Restrictions Weight Bearing Restrictions: Yes RLE Weight Bearing: Weight bearing as tolerated      Mobility  Bed Mobility Overal bed mobility: Needs Assistance Bed Mobility: Supine to Sit     Supine to sit: Min assist     General bed mobility comments: Min A for managing RLE OOB and scooting hips using bed pad to sitting EOB.  Pt w/ mod use of bed rails and increased time.  Transfers Overall transfer level: Needs assistance Equipment used: Rolling walker (2 wheeled) Transfers: Sit to/from Stand Sit to Stand: Min assist;From elevated surface         General transfer comment: Min assist to power up to standing.  Verbal cues for hand placement and placement of RLE in KI.  Ambulation/Gait Ambulation/Gait assistance: Min guard Ambulation Distance (Feet): 20 Feet Assistive device: Rolling walker (2 wheeled) Gait Pattern/deviations: Step-to pattern;Decreased stance time -  right;Decreased weight shift to right;Decreased stride length;Antalgic;Trunk flexed   Gait velocity interpretation: Below normal speed for age/gender General Gait Details: trunk flexed, inc WB through [place] Mobility    Modified Rankin (Stroke Patients Only)       Balance Overall balance assessment: Needs assistance Sitting-balance support: Bilateral upper extremity supported;Feet supported Sitting balance-Leahy Scale: Fair     Standing balance support: Bilateral upper extremity supported;During functional activity Standing balance-Leahy Scale: Fair                               Pertinent Vitals/Pain Pain Assessment: 0-10 Pain Score: 8  Pain Location: R knee Pain Descriptors / Indicators: Sharp Pain Intervention(s): Limited activity within patient's tolerance;Monitored during session;Repositioned    Home Living Family/patient expects to be discharged to:: Private residence Living Arrangements: Children (son) Available Help at Discharge: Family;Available 24 hours/day (son and sisters) Type of Home: House Home Access: Ramped entrance     Home Layout: One level Home Equipment: Poteet - 2 wheels;Bedside commode      Prior Function Level of Independence: Independent               Hand Dominance   Dominant Hand: Right    Extremity/Trunk Assessment   Upper Extremity Assessment: Defer to OT evaluation           Lower Extremity Assessment: LLE deficits/detail   LLE Deficits / Details: weakness and limited ROM as expected s/p R  TKA     Communication   Communication: No difficulties  Cognition Arousal/Alertness: Awake/alert;Lethargic Behavior During Therapy: WFL for tasks assessed/performed Overall Cognitive Status: Within Functional Limits for tasks assessed                      General Comments      Exercises Total Joint Exercises Ankle Circles/Pumps: AROM;Both;15 reps;Supine Quad Sets:  AROM;Both;5 reps;Supine Heel Slides: AROM;Right;10 reps;AAROM;Supine      Assessment/Plan    PT Assessment Patient needs continued PT services  PT Diagnosis Difficulty walking;Abnormality of gait;Generalized weakness;Acute pain   PT Problem List Decreased strength;Decreased range of motion;Decreased activity tolerance;Decreased mobility;Decreased balance;Decreased coordination;Decreased knowledge of use of DME;Decreased safety awareness;Decreased knowledge of precautions;Obesity;Decreased skin integrity;Pain  PT Treatment Interventions DME instruction;Gait training;Stair training;Functional mobility training;Therapeutic activities;Therapeutic exercise;Balance training;Neuromuscular re-education;Patient/family education;Modalities   PT Goals (Current goals can be found in the Care Plan section) Acute Rehab PT Goals Patient Stated Goal: to work hard and get stronger PT Goal Formulation: With patient/family Time For Goal Achievement: 07/11/14 Potential to Achieve Goals: Good    Frequency 7X/week   Barriers to discharge        Co-evaluation               End of Session Equipment Utilized During Treatment: Gait belt;Right knee immobilizer Activity Tolerance: Patient limited by fatigue;Patient limited by pain Patient left: in chair;with call bell/phone within reach;with nursing/sitter in room;with family/visitor present Nurse Communication: Mobility status;Precautions;Weight bearing status;Patient requests pain meds         Time: 1275-1700 PT Time Calculation (min) (ACUTE ONLY): 42 min   Charges:   PT Evaluation $Initial PT Evaluation Tier I: 1 Procedure PT Treatments $Gait Training: 8-22 mins $Therapeutic Exercise: 8-22 mins   PT G Codes:       Joslyn Hy PT, DPT [phone] Pager: [phone] 07/04/2014, 4:23 PM

## 2014-07-04 NOTE — Anesthesia Postprocedure Evaluation (Signed)
  Anesthesia Post-op Note  Patient: [name]  Procedure(s) Performed: Procedure(s): TOTAL KNEE ARTHROPLASTY (Right)  Patient Location: PACU  Anesthesia Type:GA combined with regional for post-op pain  Level of Consciousness: awake, alert , oriented and patient cooperative  Airway and Oxygen Therapy: Patient Spontanous Breathing and Patient connected to nasal cannula oxygen  Post-op Pain: mild  Post-op Assessment: Post-op Vital signs reviewed, Patient's Cardiovascular Status Stable, Respiratory Function Stable, Patent Airway, No signs of Nausea or vomiting and Pain level controlled  Post-op Vital Signs: Reviewed and stable  Last Vitals:  Filed Vitals:   07/04/14 1145  BP: 159/68  Pulse: 91  Temp:   Resp: 19    Complications: No apparent anesthesia complications

## 2014-07-04 NOTE — Anesthesia Preprocedure Evaluation (Addendum)
Anesthesia Evaluation  Patient identified by MRN, date of birth, ID band Patient awake    Reviewed: Allergy & Precautions, NPO status , Patient's Chart, lab work & pertinent test results  History of Anesthesia Complications Negative for: history of anesthetic complications  Airway Mallampati: II  TM Distance: >3 FB Neck ROM: Full    Dental  (+) Poor Dentition, Chipped, Missing, Dental Advisory Given   Pulmonary former smoker (quit 1990),  breath sounds clear to auscultation        Cardiovascular - anginanegative cardio ROS  Rhythm:Regular Rate:Normal     Neuro/Psych negative neurological ROS     GI/Hepatic Neg liver ROS, GERD-  Controlled,  Endo/Other  Morbid obesity  Renal/GU negative Renal ROS     Musculoskeletal   Abdominal (+) + obese,   Peds  Hematology   Anesthesia Other Findings   Reproductive/Obstetrics                            Anesthesia Physical Anesthesia Plan  ASA: III  Anesthesia Plan: Spinal   Post-op Pain Management: MAC Combined w/ Regional for Post-op pain   Induction:   Airway Management Planned: Natural Airway and Simple Face Mask  Additional Equipment:   Intra-op Plan:   Post-operative Plan:   Informed Consent: I have reviewed the patients History and Physical, chart, labs and discussed the procedure including the risks, benefits and alternatives for the proposed anesthesia with the patient or authorized representative who has indicated his/her understanding and acceptance.   Dental advisory given  Plan Discussed with: CRNA and Surgeon  Anesthesia Plan Comments: (Plan routine monitors, adductor canal block with SAB)        Anesthesia Quick Evaluation

## 2014-07-04 NOTE — Transfer of Care (Signed)
Immediate Anesthesia Transfer of Care Note  Patient: [name]  Procedure(s) Performed: Procedure(s): TOTAL KNEE ARTHROPLASTY (Right)  Patient Location: PACU  Anesthesia Type:General and Regional  Level of Consciousness: awake, sedated and patient cooperative  Airway & Oxygen Therapy: Patient Spontanous Breathing and Patient connected to face mask oxygen  Post-op Assessment: Report given to RN, Post -op Vital signs reviewed and stable and Patient moving all extremities  Post vital signs: Reviewed and stable  Last Vitals:  Filed Vitals:   07/04/14 0609  BP: 167/64  Pulse: 91  Temp: 36.5 C  Resp: 20    Complications: No apparent anesthesia complications

## 2014-07-04 NOTE — Progress Notes (Signed)
Orthopedic Tech Progress Note Patient Details:  [name] [facility] 1945-06-22 [id number] CPM applied to RLE with appropriate settings. OHF applied to bed. Footsie roll provided. CPM Right Knee CPM Right Knee: On Right Knee Flexion (Degrees): 90 Right Knee Extension (Degrees): 0   Asia R Thompson 07/04/2014, 11:15 AM

## 2014-07-04 NOTE — Interval H&P Note (Signed)
History and Physical Interval Note:  07/04/2014 7:05 AM  [name]  has presented today for surgery, with the diagnosis of PRIMARY LOCALIZED OA RIGHT KNEE  The various methods of treatment have been discussed with the patient and family. After consideration of risks, benefits and other options for treatment, the patient has consented to  Procedure(s): TOTAL KNEE ARTHROPLASTY (Right) as a surgical intervention .  The patient's history has been reviewed, patient examined, no change in status, stable for surgery.  I have reviewed the patient's chart and labs.  Questions were answered to the patient's satisfaction.     Elsie Saas A

## 2014-07-04 NOTE — Anesthesia Procedure Notes (Addendum)
Procedure Name: Intubation Date/Time: 07/04/2014 8:02 AM Performed by: Izora Gala Pre-anesthesia Checklist: Patient identified, Emergency Drugs available, Suction available, Patient being monitored and Timeout performed Patient Re-evaluated:Patient Re-evaluated prior to inductionOxygen Delivery Method: Circle system utilized Preoxygenation: Pre-oxygenation with 100% oxygen Intubation Type: IV induction Ventilation: Mask ventilation without difficulty Laryngoscope Size: Glidescope Grade View: Grade II Tube type: Oral Tube size: 7.5 mm Number of attempts: 1 Airway Equipment and Method: Stylet Placement Confirmation: ETT inserted through vocal cords under direct vision,  positive ETCO2 and breath sounds checked- equal and bilateral Secured at: 23 cm Tube secured with: Tape Dental Injury: Teeth and Oropharynx as per pre-operative assessment    Anesthesia Regional Block:  Adductor canal block  Pre-Anesthetic Checklist: ,, timeout performed, Correct Patient, Correct Site, Correct Laterality, Correct Procedure, Correct Position, site marked, Risks and benefits discussed,  Surgical consent,  Pre-op evaluation,  At surgeon's request and post-op pain management  Laterality: Right  Prep: chloraprep       Needles:  Injection technique: Single-shot  Needle Type: Echogenic Stimulator Needle     Needle Length: 9cm 9 cm Needle Gauge: 22 and 22 G    Additional Needles:  Procedures: ultrasound guided (picture in chart) Adductor canal block Narrative:  Start time: 07/04/2014 7:05 AM End time: 07/04/2014 8:18 AM Injection made incrementally with aspirations every 5 mL.  Performed by: Personally  Anesthesiologist: Glennon Mac, Gor Vestal  Additional Notes: Pt identified in Holding room.  Monitors applied. Working IV access confirmed. Sterile prep R thigh.  #22ga into adductor canal with US guidance.  30cc 0.5% Ropivacaine injected incrementally after negative test dose. Good spread of local  around nerve. Patient asymptomatic, VSS, no heme aspirated, tolerated well.  Jenita Seashore, MD

## 2014-07-04 NOTE — Op Note (Signed)
MRN:     [id number] DOB/AGE:    [birth year] / 69 y.o.       OPERATIVE REPORT    DATE OF PROCEDURE:  07/04/2014       PREOPERATIVE DIAGNOSIS:   PRIMARY LOCALIZED OA RIGHT KNEE      Estimated body mass index is 44.61 kg/(m^2) as calculated from the following:   Height as of this encounter: 5\' 4"  (1.626 m).   Weight as of this encounter: 117.935 kg (260 lb).                                                        POSTOPERATIVE DIAGNOSIS:   SAME                                                                    PROCEDURE:  Procedure(s): TOTAL KNEE ARTHROPLASTY Using Depuy Sigma RP implants #2.5 Femur, #3Tibia, 8mm sigma RP bearing, 32 Patella     SURGEON: Timothey Dahlstrom A    ASSISTANT:  Kirstin Shepperson PA-C   (Present and scrubbed throughout the case, critical for assistance with exposure, retraction, instrumentation, and closure.)         ANESTHESIA: GET with Femoral Nerve Block  DRAINS: foley, 2 medium hemovac in knee   TOURNIQUET TIME: 08MVH   COMPLICATIONS:  None     SPECIMENS: None   INDICATIONS FOR PROCEDURE: The patient has  DJD RIGHT KNEE, varus deformities, XR shows bone on bone arthritis. Patient has failed all conservative measures including anti-inflammatory medicines, narcotics, attempts at  exercise and weight loss, cortisone injections and viscosupplementation.  Risks and benefits of surgery have been discussed, questions answered.   DESCRIPTION OF PROCEDURE: The patient identified by armband, received  right femoral nerve block and IV antibiotics, in the holding area at [facility]. Patient taken to the operating room, appropriate anesthetic  monitors were attached General endotracheal anesthesia induced with  the patient in supine position, Foley catheter was inserted. Tourniquet  applied high to the operative thigh. Lateral post and foot positioner  applied to the table, the lower extremity was then prepped and draped  in usual sterile fashion from the ankle  to the tourniquet. Time-out procedure was performed. The limb was wrapped with an Esmarch bandage and the tourniquet inflated to 365 mmHg. We began the operation by making the anterior midline incision starting at handbreadth above the patella going over the patella 1 cm medial to and  4 cm distal to the tibial tubercle. Small bleeders in the skin and the  subcutaneous tissue identified and cauterized. Transverse retinaculum was incised and reflected medially and a medial parapatellar arthrotomy was accomplished. the patella was everted and theprepatellar fat pad resected. The superficial medial collateral  ligament was then elevated from anterior to posterior along the proximal  flare of the tibia and anterior half of the menisci resected. The knee was hyperflexed exposing bone on bone arthritis. Peripheral and notch osteophytes as well as the cruciate ligaments were then resected. We continued to  work our way around posteriorly along the proximal tibia, and externally  rotated  the tibia subluxing it out from underneath the femur. A McHale  retractor was placed through the notch and a lateral Hohmann retractor  placed, and we then drilled through the proximal tibia in line with the  axis of the tibia followed by an intramedullary guide rod and 2-degree  posterior slope cutting guide. The tibial cutting guide was pinned into place  allowing resection of 4 mm of bone medially and about 6 mm of bone  laterally because of her varus deformity. Satisfied with the tibial resection, we then  entered the distal femur 2 mm anterior to the PCL origin with the  intramedullary guide rod and applied the distal femoral cutting guide  set at 82mm, with 5 degrees of valgus. This was pinned along the  epicondylar axis. At this point, the distal femoral cut was accomplished without difficulty. We then sized for a #2.5 femoral component and pinned the guide in 3 degrees of external rotation.The chamfer cutting guide  was pinned into place. The anterior, posterior, and chamfer cuts were accomplished without difficulty followed by  the Sigma RP box cutting guide and the box cut. We also removed posterior osteophytes from the posterior femoral condyles. At this  time, the knee was brought into full extension. We checked our  extension and flexion gaps and found them symmetric at 53mm.  The patella thickness measured at 23 mm. We set the cutting guide at 15 and removed the posterior 8 mm  of the patella sized for 32 button and drilled the lollipop. The knee  was then once again hyperflexed exposing the proximal tibia. We sized for a #3 tibial base plate, applied the smokestack and the conical reamer followed by the the Delta fin keel punch. We then hammered into place the Sigma RP trial femoral component, inserted a 10-mm trial bearing, trial patellar button, and took the knee through range of motion from 0-130 degrees. No thumb pressure was required for patellar  tracking. At this point, all trial components were removed, a double batch of DePuy HV cement with 1500 mg of Zinacef was mixed and applied to all bony metallic mating surfaces except for the posterior condyles of the femur itself. In order, we  hammered into place the tibial tray and removed excess cement, the femoral component and removed excess cement, a 10-mm Sigma RP bearing  was inserted, and the knee brought to full extension with compression.  The patellar button was clamped into place, and excess cement  removed. While the cement cured the wound was irrigated out with normal saline solution pulse lavage, and medium Hemovac drains were placed.. Ligament stability and patellar tracking were checked and found to be excellent. The tourniquet was then released and hemostasis was obtained with cautery. The parapatellar arthrotomy was closed with  #1 ethibond suture. The subcutaneous tissue with 0 and 2-0 undyed  Vicryl suture, and 4-0 Monocryl.. A dressing  of Xeroform,  4 x 4, dressing sponges, Webril, and Ace wrap applied. Needle and sponge count were correct times 2.The patient awakened, extubated, and taken to recovery room without difficulty. Vascular status was normal, pulses 2+ and symmetric.   Nathali Vent A 07/04/2014, 9:42 AM

## 2014-07-05 LAB — CBC
HCT: 31 % — ABNORMAL LOW (ref 36.0–46.0)
Hemoglobin: 9.8 g/dL — ABNORMAL LOW (ref 12.0–15.0)
MCH: 27 pg (ref 26.0–34.0)
MCHC: 31.6 g/dL (ref 30.0–36.0)
MCV: 85.4 fL (ref 78.0–100.0)
Platelets: 262 10*3/uL (ref 150–400)
RBC: 3.63 MIL/uL — ABNORMAL LOW (ref 3.87–5.11)
RDW: 14.4 % (ref 11.5–15.5)
WBC: 7.8 10*3/uL (ref 4.0–10.5)

## 2014-07-05 LAB — BASIC METABOLIC PANEL
Anion gap: 6 (ref 5–15)
BUN: <5 mg/dL — ABNORMAL LOW (ref 6–20)
CO2: 24 mmol/L (ref 22–32)
Calcium: 8.3 mg/dL — ABNORMAL LOW (ref 8.9–10.3)
Chloride: 107 mmol/L (ref 101–111)
Creatinine, Ser: 0.67 mg/dL (ref 0.44–1.00)
GFR calc Af Amer: >60 mL/min (ref >60)
GFR calc non Af Amer: >60 mL/min (ref >60)
Glucose, Bld: 145 mg/dL — ABNORMAL HIGH (ref 65–99)
Potassium: 4.1 mmol/L (ref 3.5–5.1)
Sodium: 137 mmol/L (ref 135–145)

## 2014-07-05 MED ORDER — MUPIROCIN 2 % EX OINT
1.0000 "application " | TOPICAL_OINTMENT | Freq: Two times a day (BID) | CUTANEOUS | Status: DC
  Administered 2014-07-05 (×3): 1 via NASAL
  Filled 2014-07-05 (×2): qty 22

## 2014-07-05 NOTE — Progress Notes (Signed)
Physical Therapy Treatment Patient Details Name: [name] MRN: [id number] DOB: 02/17/1946 Today's Date: 07/05/2014    History of Present Illness Pt is a 69 y/o F s/p R TKA.  Pt's PMH includes L knee DJD, anemia, GERD, HOH, and RBBB.    PT Comments    Patient progressing well and motivated to increase ambulation. Continue with current POC  Follow Up Recommendations  Home health PT;Supervision/Assistance - 24 hour     Equipment Recommendations  None recommended by PT    Recommendations for Other Services       Precautions / Restrictions Precautions Precautions: Fall;Knee Precaution Comments: Reviewed no pillow under knee Knee Immobilizer - Right: Discontinue once straight leg raise with < 10 degree lag Restrictions Weight Bearing Restrictions: Yes RLE Weight Bearing: Weight bearing as tolerated    Mobility  Bed Mobility               General bed mobility comments: Patient up in recliner before and after session  Transfers Overall transfer level: Needs assistance Equipment used: Rolling walker (2 wheeled)   Sit to Stand: Min assist;From elevated surface         General transfer comment: Min assist to power up to standing.  Verbal cues for hand placement  Ambulation/Gait Ambulation/Gait assistance: Min guard Ambulation Distance (Feet): 300 Feet Assistive device: Rolling walker (2 wheeled) Gait Pattern/deviations: Step-through pattern;Decreased stride length Gait velocity: several quick standing rest break Gait velocity interpretation: Below normal speed for age/gender General Gait Details: Patient with safe use of RW. No KI required, no buckling of LE (ambulated this AM with PA without KI)   Stairs            Wheelchair Mobility    Modified Rankin (Stroke Patients Only)       Balance     Sitting balance-Leahy Scale: Good       Standing balance-Leahy Scale: Good                      Cognition Arousal/Alertness:  Awake/alert Behavior During Therapy: WFL for tasks assessed/performed Overall Cognitive Status: Within Functional Limits for tasks assessed                      Exercises Total Joint Exercises Quad Sets: AROM;Both;Supine;10 reps Heel Slides: Right;10 reps;AAROM Hip ABduction/ADduction: AAROM;Right;10 reps Straight Leg Raises: AAROM;Right;10 reps    General Comments        Pertinent Vitals/Pain Pain Assessment: 0-10 Pain Score: 3  Pain Location: R knee Pain Descriptors / Indicators: Aching;Sore Pain Intervention(s): Monitored during session    Home Living Family/patient expects to be discharged to:: Private residence Living Arrangements: Children (son) Available Help at Discharge: Family;Available 24 hours/day (son and sisters) Type of Home: House Home Access: Ramped entrance   Home Layout: One level Home Equipment: [place] - 2 wheels;Bedside commode      Prior Function Level of Independence: Independent          PT Goals (current goals can now be found in the care plan section) Acute Rehab PT Goals Patient Stated Goal: to be able to do for myself Progress towards PT goals: Progressing toward goals    Frequency  7X/week    PT Plan Current plan remains appropriate    Co-evaluation             End of Session Equipment Utilized During Treatment: Gait belt Activity Tolerance: Patient tolerated treatment well Patient left: in chair;with call bell/phone within  reach;with family/visitor present     Time: 4103-0131 PT Time Calculation (min) (ACUTE ONLY): 24 min  Charges:  $Gait Training: 8-22 mins $Therapeutic Exercise: 8-22 mins                    G Codes:      Jacqualyn Posey 07/05/2014, 11:49 AM 07/05/2014 Jacqualyn Posey PTA [phone] pager [phone] office

## 2014-07-05 NOTE — Progress Notes (Signed)
Physical Therapy Treatment Patient Details Name: BETTA BALLA MRN: [id number] DOB: 1946-01-11 Today's Date: 07/05/2014    History of Present Illness Pt is a 69 y/o F s/p R TKA.  Pt's PMH includes L knee DJD, anemia, GERD, HOH, and RBBB.    PT Comments    Patient continues to make great progress and is highly motivated. Pushed herself with ambulation this afternoon. Anticipate DC after AM session of therapy tomorrow.   Follow Up Recommendations  Home health PT;Supervision/Assistance - 24 hour     Equipment Recommendations  None recommended by PT    Recommendations for Other Services       Precautions / Restrictions Precautions Precautions: Fall;Knee Precaution Comments: Reviewed no pillow under knee Knee Immobilizer - Right: Discontinue once straight leg raise with < 10 degree lag Restrictions RLE Weight Bearing: Weight bearing as tolerated    Mobility  Bed Mobility               General bed mobility comments: Patient up in recliner before and after session  Transfers Overall transfer level: Needs assistance Equipment used: Rolling walker (2 wheeled)   Sit to Stand: Min guard         General transfer comment: Verbal cues for hand placement  Ambulation/Gait Ambulation/Gait assistance: Min guard Ambulation Distance (Feet): 400 Feet Assistive device: Rolling walker (2 wheeled) Gait Pattern/deviations: Step-through pattern;Decreased stride length Gait velocity: improving Gait velocity interpretation: Below normal speed for age/gender General Gait Details: Patient with safe use of RW. No KI required, no buckling of LE (ambulated this AM with PA without KI)   Stairs            Wheelchair Mobility    Modified Rankin (Stroke Patients Only)       Balance                                    Cognition Arousal/Alertness: Awake/alert Behavior During Therapy: WFL for tasks assessed/performed Overall Cognitive Status: Within Functional  Limits for tasks assessed                      Exercises Total Joint Exercises Quad Sets: AROM;Both;Supine;10 reps Heel Slides: Right;10 reps;AAROM Hip ABduction/ADduction: AAROM;Right;10 reps Straight Leg Raises: AAROM;Right;10 reps    General Comments        Pertinent Vitals/Pain Pain Assessment: No/denies pain Pain Score: 3  Pain Location: R knee Pain Descriptors / Indicators: Aching;Sore Pain Intervention(s): Monitored during session    Home Living                      Prior Function            PT Goals (current goals can now be found in the care plan section) Progress towards PT goals: Progressing toward goals    Frequency  7X/week    PT Plan Current plan remains appropriate    Co-evaluation             End of Session Equipment Utilized During Treatment: Gait belt Activity Tolerance: Patient tolerated treatment well Patient left: in chair;with call bell/phone within reach     Time: 1400-1426 PT Time Calculation (min) (ACUTE ONLY): 26 min  Charges:  $Gait Training: 23-37 mins $Therapeutic Exercise: 8-22 mins                    G Codes:  Jacqualyn Posey 07/05/2014, 2:41 PM 07/05/2014 Jacqualyn Posey PTA [phone] pager [phone] office

## 2014-07-05 NOTE — Progress Notes (Signed)
Occupational Therapy Evaluation Patient Details Name: [name] MRN: [id number] DOB: 06/07/1945 Today's Date: 07/05/2014    History of Present Illness Pt is a 69 y/o F s/p R TKA.  Pt's PMH includes L knee DJD, anemia, GERD, HOH, and RBBB.   Clinical Impression   PTA, pt independent with ADL and mobility. Pt making excellent progress. Will plan to educate on AE for LB ADL and tub transfers in am prior to D/C. Family plans to attend session.    Follow Up Recommendations  No OT follow up;Supervision - Intermittent    Equipment Recommendations  None recommended by OT    Recommendations for Other Services       Precautions / Restrictions Precautions Precautions: Fall;Knee Restrictions Weight Bearing Restrictions: Yes RLE Weight Bearing: Weight bearing as tolerated      Mobility Bed Mobility               General bed mobility comments:  (pt up in chair)  Transfers Overall transfer level: Needs assistance Equipment used: Rolling walker (2 wheeled)   Sit to Stand: S with use of momentum         General transfer comment:    Balance     Sitting balance-Leahy Scale: Good       Standing balance-Leahy Scale: Good                              ADL Overall ADL's : Needs assistance/impaired     Grooming: Set up   Upper Body Bathing: Set up;Standing   Lower Body Bathing: Minimal assistance;Sit to/from stand   Upper Body Dressing : Set up   Lower Body Dressing: Minimal assistance Lower Body Dressing Details (indicate cue type and reason): difficulty with socks PTA. Toilet Transfer: Supervision/safety;Ambulation;BSC (over toilet)           Functional mobility during ADLs: Supervision/safety;Rolling walker General ADL Comments: Pt states socks/shoes were difficult PTA and pt interested in learning about AE for LB ADL. Discussed tub transfer options with pt/son with use of 3 in 1. son/wife want to be educated regarding transfers prior to  D/C                     Pertinent Vitals/Pain Pain Assessment: 0-10 Pain Score: 3  Pain Location: R knee Pain Descriptors / Indicators: Aching Pain Intervention(s): Limited activity within patient's tolerance     Hand Dominance Right   Extremity/Trunk Assessment Upper Extremity Assessment Upper Extremity Assessment: Overall WFL for tasks assessed   Lower Extremity Assessment Lower Extremity Assessment: Defer to PT evaluation LLE Sensation:  (WNL)   Cervical / Trunk Assessment Cervical / Trunk Assessment: Normal   Communication Communication Communication: No difficulties   Cognition Arousal/Alertness: Awake/alert;Lethargic Behavior During Therapy: WFL for tasks assessed/performed Overall Cognitive Status: Within Functional Limits for tasks assessed                     General Comments   Pt states she wants to be able to complete her ADL so her son doesn't have to help her.            Home Living Family/patient expects to be discharged to:: Private residence Living Arrangements: Children (son) Available Help at Discharge: Family;Available 24 hours/day (son and sisters) Type of Home: House Home Access: Ramped entrance     Home Layout: One level     Bathroom Shower/Tub: Tub/shower unit Shower/tub characteristics: Curtain  Bathroom Toilet: Standard Bathroom Accessibility: Yes How Accessible: Accessible via walker Home Equipment: [place] - 2 wheels;Bedside commode          Prior Functioning/Environment Level of Independence: Independent             OT Diagnosis: Generalized weakness;Acute pain   OT Problem List: Decreased strength;Decreased range of motion;Decreased knowledge of use of DME or AE;Decreased knowledge of precautions;Obesity;Pain   OT Treatment/Interventions: Self-care/ADL training;DME and/or AE instruction;Therapeutic activities;Patient/family education    OT Goals(Current goals can be found in the care plan section) Acute  Rehab OT Goals Patient Stated Goal: to be able to do for myself OT Goal Formulation: With patient Time For Goal Achievement: 07/12/14 Potential to Achieve Goals: Good ADL Goals Pt Will Perform Lower Body Bathing: with supervision;with set-up;with adaptive equipment;sit to/from stand Pt Will Perform Lower Body Dressing: with set-up;with supervision;with adaptive equipment;sit to/from stand Pt Will Perform Tub/Shower Transfer: with supervision;ambulating;3 in 1;Tub transfer;with caregiver independent in assisting;rolling walker  OT Frequency: Min 2X/week   Barriers to D/C:  none                        End of Session Equipment Utilized During Treatment: Rolling walker CPM Right Knee CPM Right Knee: Off Nurse Communication: Mobility status  Activity Tolerance: Patient tolerated treatment well Patient left: in chair;with call bell/phone within reach   Time: 1000-1039 OT Time Calculation (min): 39 min Charges:  OT General Charges $OT Visit: 1 Procedure OT Evaluation $Initial OT Evaluation Tier I: 1 Procedure OT Treatments $Self Care/Home Management : 23-37 mins G-Codes:    Nunzio Banet,HILLARY 08/02/2014, 10:48 AM   Maurie Boettcher, OTR/L  [phone] August 02, 2014

## 2014-07-05 NOTE — Discharge Instructions (Signed)
Information on my medicine - ELIQUIS (apixaban)  This medication education was reviewed with me or my healthcare representative as part of my discharge preparation.  The pharmacist that spoke with me during my hospital stay was:  Beverlee Nims, [facility]  Why was Eliquis prescribed for you? Eliquis was prescribed for you to reduce the risk of blood clots forming after orthopedic surgery.    What do You need to know about Eliquis? Take your Eliquis TWICE DAILY - one tablet in the morning and one tablet in the evening with or without food.  It would be best to take the dose about the same time each day.  If you have difficulty swallowing the tablet whole please discuss with your pharmacist how to take the medication safely.  Take Eliquis exactly as prescribed by your doctor and DO NOT stop taking Eliquis without talking to the doctor who prescribed the medication.  Stopping without other medication to take the place of Eliquis may increase your risk of developing a clot.  After discharge, you should have regular check-up appointments with your healthcare provider that is prescribing your Eliquis.  What do you do if you miss a dose? If a dose of ELIQUIS is not taken at the scheduled time, take it as soon as possible on the same day and twice-daily administration should be resumed.  The dose should not be doubled to make up for a missed dose.  Do not take more than one tablet of ELIQUIS at the same time.  Important Safety Information A possible side effect of Eliquis is bleeding. You should call your healthcare provider right away if you experience any of the following: ? Bleeding from an injury or your nose that does not stop. ? Unusual colored urine (red or dark brown) or unusual colored stools (red or black). ? Unusual bruising for unknown reasons. ? A serious fall or if you hit your head (even if there is no bleeding).  Some medicines may interact with Eliquis and might increase  your risk of bleeding or clotting while on Eliquis. To help avoid this, consult your healthcare provider or pharmacist prior to using any new prescription or non-prescription medications, including herbals, vitamins, non-steroidal anti-inflammatory drugs (NSAIDs) and supplements.  This website has more information on Eliquis (apixaban): http://[email or web address].com/eliquis/home

## 2014-07-05 NOTE — Progress Notes (Signed)
Subjective: 1 Day Post-Op Procedure(s) (LRB): TOTAL KNEE ARTHROPLASTY (Right) Patient reports pain as 5 on 0-10 scale.    Objective: Vital signs in last 24 hours: Temp:  [96.9 F (36.1 C)-98.7 F (37.1 C)] 97.5 F (36.4 C) (05/17 0559) Pulse Rate:  [80-105] 99 (05/17 0559) Resp:  [14-24] 18 (05/16 1217) BP: (103-161)/(56-101) 120/65 mmHg (05/17 0559) SpO2:  [93 %-100 %] 98 % (05/17 0559)  Intake/Output from previous day: 05/16 0701 - 05/17 0700 In: 2940 [P.O.:420; I.V.:2420; IV Piggyback:100] Out: 2700 [Urine:2600; Drains:100] Intake/Output this shift:     Recent Labs  07/05/14 0501  HGB 9.8*    Recent Labs  07/05/14 0501  WBC 7.8  RBC 3.63*  HCT 31.0*  PLT 262    Recent Labs  07/05/14 0501  NA 137  K 4.1  CL 107  CO2 24  BUN <5*  CREATININE 0.67  GLUCOSE 145*  CALCIUM 8.3*    Recent Labs  07/04/14 0703  INR 1.25    ABD soft Neurovascular intact Sensation intact distally Intact pulses distally Dorsiflexion/Plantar flexion intact Incision: dressing C/D/I  Some upper airway congestion.  No fever or leukocytosis  Assessment/Plan: 1 Day Post-Op Procedure(s) (LRB): TOTAL KNEE ARTHROPLASTY (Right)  Active Problems:   Primary localized osteoarthritis of right knee   Arthritis   Anemia   GERD (gastroesophageal reflux disease)   Colon polyp   HOH (hard of hearing)   MRSA (methicillin resistant Staphylococcus aureus) carrier   DJD (degenerative joint disease) of knee  Advance diet Up with therapy D/C IV fluids Plan for discharge tomorrow Discharge home with home health    Linda Hedges 07/05/2014, 7:52 AM

## 2014-07-06 ENCOUNTER — Encounter (HOSPITAL_COMMUNITY): Payer: Self-pay | Admitting: Physician Assistant

## 2014-07-06 ENCOUNTER — Inpatient Hospital Stay (HOSPITAL_COMMUNITY): Payer: Medicare Other

## 2014-07-06 DIAGNOSIS — Z96651 Presence of right artificial knee joint: Secondary | ICD-10-CM | POA: Diagnosis not present

## 2014-07-06 DIAGNOSIS — J069 Acute upper respiratory infection, unspecified: Secondary | ICD-10-CM

## 2014-07-06 HISTORY — DX: Acute upper respiratory infection, unspecified: J06.9

## 2014-07-06 LAB — BASIC METABOLIC PANEL
Anion gap: 6 (ref 5–15)
BUN: 8 mg/dL (ref 6–20)
CO2: 26 mmol/L (ref 22–32)
Calcium: 8.5 mg/dL — ABNORMAL LOW (ref 8.9–10.3)
Chloride: 105 mmol/L (ref 101–111)
Creatinine, Ser: 0.7 mg/dL (ref 0.44–1.00)
GFR calc Af Amer: >60 mL/min (ref >60)
GFR calc non Af Amer: >60 mL/min (ref >60)
Glucose, Bld: 144 mg/dL — ABNORMAL HIGH (ref 65–99)
Potassium: 3.9 mmol/L (ref 3.5–5.1)
Sodium: 137 mmol/L (ref 135–145)

## 2014-07-06 LAB — CBC
HCT: 29.2 % — ABNORMAL LOW (ref 36.0–46.0)
Hemoglobin: 9.4 g/dL — ABNORMAL LOW (ref 12.0–15.0)
MCH: 27.3 pg (ref 26.0–34.0)
MCHC: 32.2 g/dL (ref 30.0–36.0)
MCV: 84.9 fL (ref 78.0–100.0)
Platelets: 238 10*3/uL (ref 150–400)
RBC: 3.44 MIL/uL — ABNORMAL LOW (ref 3.87–5.11)
RDW: 14.4 % (ref 11.5–15.5)
WBC: 9.1 10*3/uL (ref 4.0–10.5)

## 2014-07-06 MED ORDER — APIXABAN 2.5 MG PO TABS: 2.5000 mg | ORAL_TABLET | Freq: Two times a day (BID) | ORAL | Status: DC

## 2014-07-06 MED ORDER — CELECOXIB 200 MG PO CAPS: ORAL_CAPSULE | ORAL | Status: DC

## 2014-07-06 MED ORDER — DOCUSATE SODIUM 100 MG PO CAPS: ORAL_CAPSULE | ORAL | Status: DC

## 2014-07-06 MED ORDER — MUPIROCIN 2 % EX OINT: 1.0000 "application " | TOPICAL_OINTMENT | Freq: Two times a day (BID) | CUTANEOUS | Status: DC

## 2014-07-06 MED ORDER — POLYETHYLENE GLYCOL 3350 17 G PO PACK: PACK | ORAL | Status: DC

## 2014-07-06 MED ORDER — OXYCODONE HCL 5 MG PO TABS: ORAL_TABLET | ORAL | Status: DC

## 2014-07-06 MED ORDER — DOXYCYCLINE HYCLATE 100 MG PO TABS: 100.0000 mg | ORAL_TABLET | Freq: Two times a day (BID) | ORAL | Status: DC

## 2014-07-06 MED ORDER — ACETAMINOPHEN 325 MG PO TABS: 650.0000 mg | ORAL_TABLET | Freq: Four times a day (QID) | ORAL | Status: DC | PRN

## 2014-07-06 MED ORDER — DOXYCYCLINE HYCLATE 100 MG PO TABS
100.0000 mg | ORAL_TABLET | Freq: Two times a day (BID) | ORAL | Status: DC
  Administered 2014-07-06: 100 mg via ORAL
  Filled 2014-07-06: qty 1

## 2014-07-06 NOTE — Progress Notes (Signed)
Pt a/o, oob with PT, pt d/c to home with home health, pt given dc instructions and medications reviewed, pt verbalized understanding, pt left via wheelchair with family

## 2014-07-06 NOTE — Discharge Summary (Signed)
Patient ID: [name] MRN: [id number] DOB/AGE: January 31, 1946 69 y.o.  Admit date: 07/04/2014 Discharge date: 07/06/2014  Admission Diagnoses:  Active Problems:   Primary localized osteoarthritis of right knee   Arthritis   Anemia   GERD (gastroesophageal reflux disease)   Colon polyp   HOH (hard of hearing)   MRSA (methicillin resistant Staphylococcus aureus) carrier   DJD (degenerative joint disease) of knee   Acute upper respiratory infection   Discharge Diagnoses:  Same  Past Medical History  Diagnosis Date  . 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 Arthritis     Back   . Anemia   . GERD (gastroesophageal reflux disease)   . Colon polyp     Tubular Adenoma   . HOH (hard of hearing)   . Primary localized osteoarthritis of right knee   . History of right bundle branch block (RBBB)   . Cough productive of clear sputum 06/22/2014  . Acute upper respiratory infection 07/06/2014    Surgeries: Procedure(s): TOTAL KNEE ARTHROPLASTY on 07/04/2014   Consultants:    Discharged Condition: Improved  Hospital Course: [name] is an 69 y.o. female who was admitted 07/04/2014 for operative treatment of<principal problem not specified>. Patient has severe unremitting pain that affects sleep, daily activities, and work/hobbies. After pre-op clearance the patient was taken to the operating room on 07/04/2014 and underwent  Procedure(s): TOTAL KNEE ARTHROPLASTY.    Patient was given perioperative antibiotics:      Anti-infectives    Start     Dose/Rate Route Frequency Ordered Stop   07/06/14 1000  doxycycline (VIBRA-TABS) tablet 100 mg     100 mg Oral Every 12 hours 07/06/14 0803     07/06/14 0000  doxycycline (VIBRA-TABS) 100 MG tablet     100 mg Oral Every 12 hours 07/06/14 0804     07/05/14 0230  ceFAZolin (ANCEF) IVPB 2 g/50 mL premix     2 g 100 mL/hr over 30 Minutes Intravenous 4 times per day 07/04/14 2120 07/05/14 0231   07/04/14 1700  vancomycin (VANCOCIN) IVPB 1000 mg/200 mL  premix  Status:  Discontinued     1,000 mg 200 mL/hr over 60 Minutes Intravenous Every 12 hours 07/04/14 1655 07/04/14 1704   07/04/14 1300  vancomycin (VANCOCIN) IVPB 1000 mg/200 mL premix     1,000 mg 200 mL/hr over 60 Minutes Intravenous Every 12 hours 07/04/14 1245 07/04/14 1724   07/04/14 1300  ceFAZolin (ANCEF) IVPB 2 g/50 mL premix  Status:  Discontinued     2 g 100 mL/hr over 30 Minutes Intravenous 4 times per day 07/04/14 1245 07/04/14 2120   07/04/14 0836  cefUROXime (ZINACEF) injection  Status:  Discontinued       As needed 07/04/14 0837 07/04/14 1022   07/04/14 0615  vancomycin (VANCOCIN) 1,500 mg in sodium chloride 0.9 % 500 mL IVPB     1,500 mg 250 mL/hr over 120 Minutes Intravenous To ShortStay Surgical 07/04/14 0606 07/04/14 0745   07/04/14 0608  ceFAZolin (ANCEF) 2-3 GM-% IVPB SOLR    Comments:  [name]   : cabinet override      07/04/14 0608 07/04/14 0800   07/04/14 0606  ceFAZolin (ANCEF) IVPB 2 g/50 mL premix  Status:  Discontinued     2 g 100 mL/hr over 30 Minutes Intravenous On call to O.R. 07/04/14 0606 07/04/14 1214       Patient was given sequential compression devices, early ambulation, and chemoprophylaxis to prevent DVT.  Patient benefited  maximally from hospital stay and there were no complications.    Recent vital signs:  Patient Vitals for the past 24 hrs:  BP Temp Pulse Resp SpO2  07/06/14 0454 (!) 146/69 mmHg 98.6 F (37 C) 95 17 98 %  07/05/14 1935 (!) 139/49 mmHg 99.1 F (37.3 C) 89 16 99 %     Recent laboratory studies:  Recent Labs  07/04/14 0703  07/05/14 0501 07/06/14 0436  WBC  --   --  7.8 9.1  HGB  --   --  9.8* 9.4*  HCT  --   --  31.0* 29.2*  PLT  --   --  262 238  NA  --   --  137 137  K  --   --  4.1 3.9  CL  --   --  107 105  CO2  --   --  24 26  BUN  --   --  <5* 8  CREATININE  --   --  0.67 0.70  GLUCOSE  --   --  145* 144*  INR 1.25  --   --   --   CALCIUM  --   < > 8.3* 8.5*  < > = values in this interval  not displayed.   Discharge Medications:     Medication List    STOP taking these medications        ibuprofen 200 MG tablet  Commonly known as:  ADVIL,MOTRIN     oxyCODONE-acetaminophen 5-325 MG per tablet  Commonly known as:  ROXICET      TAKE these medications        acetaminophen 325 MG tablet  Commonly known as:  TYLENOL  Take 2 tablets (650 mg total) by mouth every 6 (six) hours as needed for mild pain (or Fever >/= 101).     apixaban 2.5 MG Tabs tablet  Commonly known as:  ELIQUIS  Take 1 tablet (2.5 mg total) by mouth every 12 (twelve) hours.     celecoxib 200 MG capsule  Commonly known as:  CELEBREX  1 tab po q day with food for pain and  swelling     docusate sodium 100 MG capsule  Commonly known as:  COLACE  1 tab 2 times a day while on narcotics.  STOOL SOFTENER     doxycycline 100 MG tablet  Commonly known as:  VIBRA-TABS  Take 1 tablet (100 mg total) by mouth every 12 (twelve) hours.     furosemide 20 MG tablet  Commonly known as:  LASIX  Take 20 mg by mouth daily as needed for fluid (Does not take if going out and about).     mupirocin ointment 2 %  Commonly known as:  BACTROBAN  Place 1 application into the nose 2 (two) times daily.     oxyCODONE 5 MG immediate release tablet  Commonly known as:  Oxy IR/ROXICODONE  1-2 tablets every 4-6 hrs as needed for pain     pantoprazole 40 MG tablet  Commonly known as:  PROTONIX  Take 40 mg by mouth daily.     polyethylene glycol packet  Commonly known as:  MIRALAX / GLYCOLAX  17 grams in 8 ounces of water twice a day until bowel movements are regular        Diagnostic Studies: Dg Chest 2 View  06/22/2014   CLINICAL DATA:  69 year old female with fever shortness of breath and productive cough. Initial encounter.  EXAM: CHEST  2 VIEW  COMPARISON:  08/15/2011.  FINDINGS: Stable lung volumes. Stable mild cardiomegaly. Other mediastinal contours are within normal limits. No pneumothorax, pleural effusion,  pulmonary edema or consolidation. Mild basilar predominant streaky and interstitial pulmonary opacity. No acute osseous abnormality identified.  IMPRESSION: Mild bilateral basilar predominant interstitial and streaky pulmonary opacity which could reflect viral or atypical respiratory infection in this setting. No pleural effusion.   Electronically Signed   By: Genevie Ann M.D.   On: 06/22/2014 12:12    Disposition: 01-Home or Self Care  Discharge Instructions    CPM    Complete by:  As directed   Continuous passive motion machine (CPM):      Use the CPM from 0 to 90 for 6 hours per day.       You may break it up into 2 or 3 sessions per day.      Use CPM for 2 weeks or until you are told to stop.     Call MD / Call 911    Complete by:  As directed   If you experience chest pain or shortness of breath, CALL 911 and be transported to the hospital emergency room.  If you develope a fever above 101 F, pus (white drainage) or increased drainage or redness at the wound, or calf pain, call your surgeon's office.     Change dressing    Complete by:  As directed   Change the gauze dressing daily with sterile 4 x 4 inch gauze and apply TED hose.  DO NOT REMOVE BANDAGE OVER SURGICAL INCISION.  [place] WHOLE LEG INCLUDING OVER THE WATERPROOF BANDAGE WITH SOAP AND WATER EVERY DAY.     Constipation Prevention    Complete by:  As directed   Drink plenty of fluids.  Prune juice may be helpful.  You may use a stool softener, such as Colace (over the counter) 100 mg twice a day.  Use MiraLax (over the counter) for constipation as needed.     Diet - low sodium heart healthy    Complete by:  As directed      Discharge instructions    Complete by:  As directed   INSTRUCTIONS AFTER JOINT REPLACEMENT   Remove items at home which could result in a fall. This includes throw rugs or furniture in walking pathways ICE to the affected joint every three hours while awake for 30 minutes at a time, for at least the first 3-5  days, and then as needed for pain and swelling.  Continue to use ice for pain and swelling. You may notice swelling that will progress down to the foot and ankle.  This is normal after surgery.  Elevate your leg when you are not up walking on it.   Continue to use the breathing machine you got in the hospital (incentive spirometer) which will help keep your temperature down.  It is common for your temperature to cycle up and down following surgery, especially at night when you are not up moving around and exerting yourself.  The breathing machine keeps your lungs expanded and your temperature down.   DIET:  As you were doing prior to hospitalization, we recommend a well-balanced diet.  DRESSING / WOUND CARE / SHOWERING  Keep the surgical dressing until follow up.  The dressing is water proof, so you can shower without any extra covering.  IF THE DRESSING FALLS OFF or the wound gets wet inside, change the dressing with sterile gauze.  Please use good hand washing techniques  before changing the dressing.  Do not use any lotions or creams on the incision until instructed by your surgeon.    ACTIVITY  Increase activity slowly as tolerated, but follow the weight bearing instructions below.   No driving for 6 weeks or until further direction given by your physician.  You cannot drive while taking narcotics.  No lifting or carrying greater than 10 lbs. until further directed by your surgeon. Avoid periods of inactivity such as sitting longer than an hour when not asleep. This helps prevent blood clots.  You may return to work once you are authorized by your doctor.     WEIGHT BEARING   Weight bearing as tolerated with assist device (walker, cane, etc) as directed, use it as long as suggested by your surgeon or therapist, typically at least 1-2 weeks.   EXERCISES  Results after joint replacement surgery are often greatly improved when you follow the exercise, range of motion and muscle  strengthening exercises prescribed by your doctor. Safety measures are also important to protect the joint from further injury. Any time any of these exercises cause you to have increased pain or swelling, decrease what you are doing until you are comfortable again and then slowly increase them. If you have problems or questions, call your caregiver or physical therapist for advice.   Rehabilitation is important following a joint replacement. After just a few days of immobilization, the muscles of the leg can become weakened and shrink (atrophy).  These exercises are designed to build up the tone and strength of the thigh and leg muscles and to improve motion. Often times heat used for twenty to thirty minutes before working out will loosen up your tissues and help with improving the range of motion but do not use heat for the first two weeks following surgery (sometimes heat can increase post-operative swelling).   These exercises can be done on a training (exercise) mat, on the floor, on a table or on a bed. Use whatever works the best and is most comfortable for you.    Use music or television while you are exercising so that the exercises are a pleasant break in your day. This will make your life better with the exercises acting as a break in your routine that you can look forward to.   Perform all exercises about fifteen times, three times per day or as directed.  You should exercise both the operative leg and the other leg as well.   Exercises include:   Quad Sets - Tighten up the muscle on the front of the thigh (Quad) and hold for 5-10 seconds.   Straight Leg Raises - With your knee straight (if you were given a brace, keep it on), lift the leg to 60 degrees, hold for 3 seconds, and slowly lower the leg.  Perform this exercise against resistance later as your leg gets stronger.  Leg Slides: Lying on your back, slowly slide your foot toward your buttocks, bending your knee up off the floor (only go  as far as is comfortable). Then slowly slide your foot back down until your leg is flat on the floor again.  Angel Wings: Lying on your back spread your legs to the side as far apart as you can without causing discomfort.  Hamstring Strength:  Lying on your back, push your heel against the floor with your leg straight by tightening up the muscles of your buttocks.  Repeat, but this time bend your knee to a comfortable  angle, and push your heel against the floor.  You may put a pillow under the heel to make it more comfortable if necessary.   A rehabilitation program following joint replacement surgery can speed recovery and prevent re-injury in the future due to weakened muscles. Contact your doctor or a physical therapist for more information on knee rehabilitation.    CONSTIPATION  Constipation is defined medically as fewer than three stools per week and severe constipation as less than one stool per week.  Even if you have a regular bowel pattern at home, your normal regimen is likely to be disrupted due to multiple reasons following surgery.  Combination of anesthesia, postoperative narcotics, change in appetite and fluid intake all can affect your bowels.   YOU MUST use at least one of the following options; they are listed in order of increasing strength to get the job done.  They are all available over the counter, and you may need to use some, POSSIBLY even all of these options:    Drink plenty of fluids (prune juice may be helpful) and high fiber foods Colace 100 mg by mouth twice a day  Senokot for constipation as directed and as needed Dulcolax (bisacodyl), take with full glass of water  Miralax (polyethylene glycol) once or twice a day as needed.  If you have tried all these things and are unable to have a bowel movement in the first 3-4 days after surgery call either your surgeon or your primary doctor.    If you experience loose stools or diarrhea, hold the medications until you  stool forms back up.  If your symptoms do not get better within 1 week or if they get worse, check with your doctor.  If you experience "the worst abdominal pain ever" or develop nausea or vomiting, please contact the office immediately for further recommendations for treatment.   ITCHING:  If you experience itching with your medications, try taking only a single pain pill, or even half a pain pill at a time.  You can also use Benadryl over the counter for itching or also to help with sleep.   TED HOSE STOCKINGS:  Use stockings on both legs until for at least 2 weeks or as directed by physician office. They may be removed at night for sleeping.  MEDICATIONS:  See your medication summary on the "After Visit Summary" that nursing will review with you.  You may have some home medications which will be placed on hold until you complete the course of blood thinner medication.  It is important for you to complete the blood thinner medication as prescribed.  PRECAUTIONS:  If you experience chest pain or shortness of breath - call 911 immediately for transfer to the hospital emergency department.   If you develop a fever greater that 101 F, purulent drainage from wound, increased redness or drainage from wound, foul odor from the wound/dressing, or calf pain - CONTACT YOUR SURGEON.                                                   FOLLOW-UP APPOINTMENTS:  If you do not already have a post-op appointment, please call the office for an appointment to be seen by your surgeon.  Guidelines for how soon to be seen are listed in your "After Visit Summary", but are typically between 1-4  weeks after surgery.  OTHER INSTRUCTIONS:   Knee Replacement:  Do not place pillow under knee, focus on keeping the knee straight while resting. CPM instructions: 0-90 degrees, 2 hours in the morning, 2 hours in the afternoon, and 2 hours in the evening. Place foam block, curve side up under heel at all times except when in CPM or  when walking.  DO NOT modify, tear, cut, or change the foam block in any way.  MAKE SURE YOU:  Understand these instructions.  Get help right away if you are not doing well or get worse.    Thank you for letting us be a part of your medical care team.  It is a privilege we respect greatly.  We hope these instructions will help you stay on track for a fast and full recovery!     Do not put a pillow under the knee. Place it under the heel.    Complete by:  As directed   Place gray foam block, curve side up under heel at all times except when in CPM or when walking.  DO NOT modify, tear, cut, or change in any way the gray foam block.     Increase activity slowly as tolerated    Complete by:  As directed      TED hose    Complete by:  As directed   Use stockings (TED hose) for 2 weeks on both leg(s).  You may remove them at night for sleeping.           Follow-up Information    Follow up with Lorn Junes, MD On 07/19/2014.   Specialty:  Orthopedic Surgery   Why:  appt time 8:45 am   Contact information:   [address] [place] [address] [phone]        Signed: Linda Hedges 07/06/2014, 8:04 AM

## 2014-07-06 NOTE — Progress Notes (Signed)
Occupational Therapy Treatment Patient Details Name: [name] MRN: [id number] DOB: 10/11/1945 Today's Date: 07/06/2014    History of present illness Pt is a 69 y/o F s/p R TKA.  Pt's PMH includes L knee DJD, anemia, GERD, HOH, and RBBB.   OT comments  Completed all education with family. Pt ready to D/C home with S of family. All Goals met. No further OT needed.   Follow Up Recommendations  No OT follow up;Supervision - Intermittent    Equipment Recommendations  None recommended by OT    Recommendations for Other Services      Precautions / Restrictions Precautions Precautions: Fall;Knee Precaution Comments: Reviewed no pillow under knee Knee Immobilizer - Right: Discontinue once straight leg raise with < 10 degree lag Restrictions RLE Weight Bearing: Weight bearing as tolerated       Mobility Bed Mobility  Family able to assist as needed.                 Transfers Overall transfer level: Modified independent                    Balance Overall balance assessment: Modified Independent                                 ADL                                         General ADL Comments: Completed education with son/daughter in law regarding use of 3 in 1 for tub. also educated on availability of tub bench as safer option. Son able to return demonstrate technique for use of 3 in 1 with stepping over tub  at RW level..Family demosntrate ability to safely assist pt with ADL at [facility] level.                                      Cognition   Behavior During Therapy: WFL for tasks assessed/performed Overall Cognitive Status: Within Functional Limits for tasks assessed                                                 General Comments  Family appreciative of information.    Pertinent Vitals/ Pain       Pain Score: 5  Pain Location: R knee Pain Descriptors / Indicators: Sore Pain  Intervention(s): Limited activity within patient's tolerance;Monitored during session                                                          Frequency Min 2X/week     Progress Toward Goals  OT Goals(current goals can now be found in the care plan section)  Progress towards OT goals: Goals met/education completed, patient discharged from OT  Acute Rehab OT Goals Patient Stated Goal: to be able to do for myself OT Goal Formulation: With patient Time For Goal Achievement: 07/12/14 Potential to Achieve Goals:  Good ADL Goals Pt Will Perform Lower Body Bathing: with supervision;with set-up;with adaptive equipment;sit to/from stand Pt Will Perform Lower Body Dressing: with set-up;with supervision;with adaptive equipment;sit to/from stand Pt Will Perform Tub/Shower Transfer: with supervision;ambulating;3 in 1;Tub transfer;with caregiver independent in assisting;rolling walker  Plan All goals met and education completed, patient discharged from OT services                     End of Session Equipment Utilized During Treatment: Rolling walker   Activity Tolerance Patient tolerated treatment well   Patient Left in chair;with call bell/phone within reach   Nurse Communication Other (comment) (ready for D/C)        Time: 0786-7544 OT Time Calculation (min): 14 min  Charges: OT General Charges $OT Visit: 1 Procedure OT Treatments $Self Care/Home Management : 8-22 mins  Marg Macmaster,HILLARY 07/06/2014, 12:13 PM

## 2014-07-06 NOTE — Plan of Care (Signed)
Problem: Consults Goal: Diagnosis- Total Joint Replacement Primary Total Knee     

## 2014-07-06 NOTE — Progress Notes (Signed)
Occupational Therapy Treatment Patient Details Name: RHYLEI MCQUAIG MRN: [id number] DOB: February 10, 1946 Today's Date: 07/06/2014    History of present illness Pt is a 69 y/o F s/p R TKA.  Pt's PMH includes L knee DJD, anemia, GERD, HOH, and RBBB.   OT comments  Completed education regarding use of AE/DME for ADL. Pt able to return demonstrate. Pt asked for OT to return to review tub transfer techniques with her son.   Follow Up Recommendations  No OT follow up;Supervision - Intermittent    Equipment Recommendations  None recommended by OT    Recommendations for Other Services      Precautions / Restrictions Precautions Precautions: Fall;Knee Precaution Comments: Reviewed no pillow under knee Knee Immobilizer - Right: Discontinue once straight leg raise with < 10 degree lag Restrictions Weight Bearing Restrictions: Yes RLE Weight Bearing: Weight bearing as tolerated       Mobility Bed Mobility                 Transfers Overall transfer level: Modified independent                    Balance                                   ADL                                         General ADL Comments: completed education regarding use of AE for LB ADL with reacher, sock aid and long handled sponge. Pt able to return demonstrate. discussed tub transfer techniques. Pt asked for therapist to return to educate her son.      Vision                     Perception     Praxis      Cognition   Behavior During Therapy: WFL for tasks assessed/performed Overall Cognitive Status: Within Functional Limits for tasks assessed                       Extremity/Trunk Assessment               Exercises     Shoulder Instructions       General Comments      Pertinent Vitals/ Pain       Pain Score: 5  Pain Location: R knee Pain Descriptors / Indicators: Sore Pain Intervention(s): Limited activity within patient's  tolerance  Home Living                                          Prior Functioning/Environment              Frequency Min 2X/week     Progress Toward Goals  OT Goals(current goals can now be found in the care plan section)  Progress towards OT goals: Progressing toward goals  Acute Rehab OT Goals Patient Stated Goal: to be able to do for myself OT Goal Formulation: With patient Time For Goal Achievement: 07/12/14 Potential to Achieve Goals: Good ADL Goals Pt Will Perform Lower Body Bathing: with supervision;with set-up;with adaptive equipment;sit to/from stand Pt Will Perform Lower Body Dressing: with  set-up;with supervision;with adaptive equipment;sit to/from stand Pt Will Perform Tub/Shower Transfer: with supervision;ambulating;3 in 1;Tub transfer;with caregiver independent in assisting;rolling walker  Plan Discharge plan remains appropriate    Co-evaluation                 End of Session CPM Right Knee CPM Right Knee: Off   Activity Tolerance Patient tolerated treatment well   Patient Left in chair;with call bell/phone within reach   Nurse Communication Mobility status        Time: 2836-6294 OT Time Calculation (min): 17 min  Charges: OT General Charges $OT Visit: 1 Procedure OT Treatments $Self Care/Home Management : 8-22 mins  Meya Clutter,HILLARY 07/06/2014, 12:06 PM   [facility], OTR/L  [phone] 07/06/2014

## 2014-07-06 NOTE — Progress Notes (Signed)
Physical Therapy Treatment Patient Details Name: [name] MRN: [id number] DOB: 11/20/1945 Today's Date: 07/06/2014    History of Present Illness Pt is a 69 y/o F s/p R TKA.  Pt's PMH includes L knee DJD, anemia, GERD, HOH, and RBBB.    PT Comments    Patient progressing well with overall mobility. Agreeable to ambulate prior to DC home. Deferred therex but given HEP. Patient left in room getting dressed with assistance from family. RN coming to DC patient  Follow Up Recommendations  Home health PT;Supervision/Assistance - 24 hour     Equipment Recommendations  None recommended by PT    Recommendations for Other Services       Precautions / Restrictions Precautions Precautions: Fall;Knee Precaution Comments: Reviewed no pillow under knee Knee Immobilizer - Right: Discontinue once straight leg raise with < 10 degree lag Restrictions Weight Bearing Restrictions: Yes RLE Weight Bearing: Weight bearing as tolerated    Mobility  Bed Mobility               General bed mobility comments: Patient up in recliner before and after session  Transfers Overall transfer level: Needs assistance Equipment used: Rolling walker (2 wheeled)   Sit to Stand: Supervision         General transfer comment: Verbal cues for hand placement  Ambulation/Gait Ambulation/Gait assistance: Supervision Ambulation Distance (Feet): 300 Feet Assistive device: Rolling walker (2 wheeled) Gait Pattern/deviations: Step-through pattern Gait velocity: improving   General Gait Details: Initially antalgic but with increased distance patient progressed well.    Stairs            Wheelchair Mobility    Modified Rankin (Stroke Patients Only)       Balance                                    Cognition Arousal/Alertness: Awake/alert Behavior During Therapy: WFL for tasks assessed/performed Overall Cognitive Status: Within Functional Limits for tasks assessed                       Exercises      General Comments        Pertinent Vitals/Pain Pain Score: 8  Pain Location: R knee medial aspect Pain Descriptors / Indicators: Sore;Sharp Pain Intervention(s): Monitored during session;Limited activity within patient's tolerance    Home Living                      Prior Function            PT Goals (current goals can now be found in the care plan section) Progress towards PT goals: Progressing toward goals    Frequency  7X/week    PT Plan Current plan remains appropriate    Co-evaluation             End of Session Equipment Utilized During Treatment: Gait belt Activity Tolerance: Patient tolerated treatment well Patient left: in chair;with call bell/phone within reach;with family/visitor present     Time: 3818-2993 PT Time Calculation (min) (ACUTE ONLY): 14 min  Charges:  $Gait Training: 8-22 mins                    G Codes:      Jacqualyn Posey 07/06/2014, 11:50 AM 07/06/2014 Jacqualyn Posey PTA [phone] pager [phone] office

## 2014-07-07 DIAGNOSIS — Z471 Aftercare following joint replacement surgery: Secondary | ICD-10-CM | POA: Diagnosis not present

## 2014-07-07 DIAGNOSIS — D649 Anemia, unspecified: Secondary | ICD-10-CM | POA: Diagnosis not present

## 2014-07-07 DIAGNOSIS — Z96651 Presence of right artificial knee joint: Secondary | ICD-10-CM | POA: Diagnosis not present

## 2014-07-07 DIAGNOSIS — Z7901 Long term (current) use of anticoagulants: Secondary | ICD-10-CM | POA: Diagnosis not present

## 2014-07-07 DIAGNOSIS — Z22321 Carrier or suspected carrier of Methicillin susceptible Staphylococcus aureus: Secondary | ICD-10-CM | POA: Diagnosis not present

## 2014-07-07 DIAGNOSIS — K219 Gastro-esophageal reflux disease without esophagitis: Secondary | ICD-10-CM | POA: Diagnosis not present

## 2014-07-08 DIAGNOSIS — Z471 Aftercare following joint replacement surgery: Secondary | ICD-10-CM | POA: Diagnosis not present

## 2014-07-08 DIAGNOSIS — Z7901 Long term (current) use of anticoagulants: Secondary | ICD-10-CM | POA: Diagnosis not present

## 2014-07-08 DIAGNOSIS — Z96651 Presence of right artificial knee joint: Secondary | ICD-10-CM | POA: Diagnosis not present

## 2014-07-08 DIAGNOSIS — D649 Anemia, unspecified: Secondary | ICD-10-CM | POA: Diagnosis not present

## 2014-07-08 DIAGNOSIS — K219 Gastro-esophageal reflux disease without esophagitis: Secondary | ICD-10-CM | POA: Diagnosis not present

## 2014-07-08 DIAGNOSIS — Z22321 Carrier or suspected carrier of Methicillin susceptible Staphylococcus aureus: Secondary | ICD-10-CM | POA: Diagnosis not present

## 2014-07-11 DIAGNOSIS — Z471 Aftercare following joint replacement surgery: Secondary | ICD-10-CM | POA: Diagnosis not present

## 2014-07-11 DIAGNOSIS — D649 Anemia, unspecified: Secondary | ICD-10-CM | POA: Diagnosis not present

## 2014-07-11 DIAGNOSIS — Z22321 Carrier or suspected carrier of Methicillin susceptible Staphylococcus aureus: Secondary | ICD-10-CM | POA: Diagnosis not present

## 2014-07-11 DIAGNOSIS — K219 Gastro-esophageal reflux disease without esophagitis: Secondary | ICD-10-CM | POA: Diagnosis not present

## 2014-07-11 DIAGNOSIS — Z7901 Long term (current) use of anticoagulants: Secondary | ICD-10-CM | POA: Diagnosis not present

## 2014-07-11 DIAGNOSIS — Z96651 Presence of right artificial knee joint: Secondary | ICD-10-CM | POA: Diagnosis not present

## 2014-07-13 DIAGNOSIS — Z22321 Carrier or suspected carrier of Methicillin susceptible Staphylococcus aureus: Secondary | ICD-10-CM | POA: Diagnosis not present

## 2014-07-13 DIAGNOSIS — Z96651 Presence of right artificial knee joint: Secondary | ICD-10-CM | POA: Diagnosis not present

## 2014-07-13 DIAGNOSIS — Z7901 Long term (current) use of anticoagulants: Secondary | ICD-10-CM | POA: Diagnosis not present

## 2014-07-13 DIAGNOSIS — D649 Anemia, unspecified: Secondary | ICD-10-CM | POA: Diagnosis not present

## 2014-07-13 DIAGNOSIS — Z471 Aftercare following joint replacement surgery: Secondary | ICD-10-CM | POA: Diagnosis not present

## 2014-07-13 DIAGNOSIS — K219 Gastro-esophageal reflux disease without esophagitis: Secondary | ICD-10-CM | POA: Diagnosis not present

## 2014-07-15 DIAGNOSIS — D649 Anemia, unspecified: Secondary | ICD-10-CM | POA: Diagnosis not present

## 2014-07-15 DIAGNOSIS — Z96651 Presence of right artificial knee joint: Secondary | ICD-10-CM | POA: Diagnosis not present

## 2014-07-15 DIAGNOSIS — K219 Gastro-esophageal reflux disease without esophagitis: Secondary | ICD-10-CM | POA: Diagnosis not present

## 2014-07-15 DIAGNOSIS — Z22321 Carrier or suspected carrier of Methicillin susceptible Staphylococcus aureus: Secondary | ICD-10-CM | POA: Diagnosis not present

## 2014-07-15 DIAGNOSIS — Z7901 Long term (current) use of anticoagulants: Secondary | ICD-10-CM | POA: Diagnosis not present

## 2014-07-15 DIAGNOSIS — Z471 Aftercare following joint replacement surgery: Secondary | ICD-10-CM | POA: Diagnosis not present

## 2014-07-18 DIAGNOSIS — Z7901 Long term (current) use of anticoagulants: Secondary | ICD-10-CM | POA: Diagnosis not present

## 2014-07-18 DIAGNOSIS — D649 Anemia, unspecified: Secondary | ICD-10-CM | POA: Diagnosis not present

## 2014-07-18 DIAGNOSIS — K219 Gastro-esophageal reflux disease without esophagitis: Secondary | ICD-10-CM | POA: Diagnosis not present

## 2014-07-18 DIAGNOSIS — Z96651 Presence of right artificial knee joint: Secondary | ICD-10-CM | POA: Diagnosis not present

## 2014-07-18 DIAGNOSIS — Z471 Aftercare following joint replacement surgery: Secondary | ICD-10-CM | POA: Diagnosis not present

## 2014-07-18 DIAGNOSIS — Z22321 Carrier or suspected carrier of Methicillin susceptible Staphylococcus aureus: Secondary | ICD-10-CM | POA: Diagnosis not present

## 2014-07-19 DIAGNOSIS — Z96651 Presence of right artificial knee joint: Secondary | ICD-10-CM | POA: Diagnosis not present

## 2014-07-20 ENCOUNTER — Encounter (HOSPITAL_COMMUNITY): Payer: Self-pay | Admitting: *Deleted

## 2014-07-20 ENCOUNTER — Emergency Department (INDEPENDENT_AMBULATORY_CARE_PROVIDER_SITE_OTHER)
Admission: EM | Admit: 2014-07-20 | Discharge: 2014-07-20 | Disposition: A | Discharge status: Home / Self Care | Payer: Medicare Other | Source: Home / Self Care | Attending: Family Medicine | Admitting: Family Medicine

## 2014-07-20 DIAGNOSIS — Z7901 Long term (current) use of anticoagulants: Secondary | ICD-10-CM | POA: Diagnosis not present

## 2014-07-20 DIAGNOSIS — R21 Rash and other nonspecific skin eruption: Secondary | ICD-10-CM

## 2014-07-20 DIAGNOSIS — Z471 Aftercare following joint replacement surgery: Secondary | ICD-10-CM | POA: Diagnosis not present

## 2014-07-20 DIAGNOSIS — D649 Anemia, unspecified: Secondary | ICD-10-CM | POA: Diagnosis not present

## 2014-07-20 DIAGNOSIS — Z22321 Carrier or suspected carrier of Methicillin susceptible Staphylococcus aureus: Secondary | ICD-10-CM | POA: Diagnosis not present

## 2014-07-20 DIAGNOSIS — K219 Gastro-esophageal reflux disease without esophagitis: Secondary | ICD-10-CM | POA: Diagnosis not present

## 2014-07-20 DIAGNOSIS — Z96651 Presence of right artificial knee joint: Secondary | ICD-10-CM | POA: Diagnosis not present

## 2014-07-20 MED ORDER — PREDNISONE 5 MG (21) PO TBPK: 5.0000 mg | ORAL_TABLET | Freq: Every day | ORAL | Status: DC

## 2014-07-20 MED ORDER — TRIAMCINOLONE ACETONIDE 0.1 % EX CREA: 1.0000 "application " | TOPICAL_CREAM | Freq: Two times a day (BID) | CUTANEOUS | Status: DC

## 2014-07-20 NOTE — ED Notes (Signed)
Pt  Reports  Symptoms    Of       Facial     Swelling        Itching            On  Back  And  Arms    For    Several  Days        Pt    Reports     She  Had  Surgery  sev  Weeks  Ago      And  Has  Started new  meds

## 2014-07-20 NOTE — Discharge Instructions (Signed)
Thank you for coming in today.  Please STOP celebrex.  Use prednisone for 6 days.  Use triamcinolone cream twice daily for rash as needed.  Return as needed.   Drug Rash Skin reactions can be caused by several different drugs. Allergy to the medicine can cause itching, hives, and other rashes. Sun exposure causes a red rash with some medicines. Mononucleosis virus can cause a similar red rash when you are taking antibiotics. Sometimes, the rash may be accompanied by pain. The drug rash may happen with new drugs or with medicines that you have been taking for a while. The rash cannot be spread from person to person. In most cases, the symptoms of a drug rash are gone within a few days of stopping the medicine. Your rash, including hives (urticaria), is most likely from the following medicines:  Antibiotics or antimicrobials.  Anticonvulsants or seizure medicines.  Antihypertensives or blood pressure medicines.  Antimalarials.  Antidepressants or depression medicines.  Antianxiety drugs.  Diuretics or water pills.  Nonsteroidal anti-inflammatory drugs.  Simvastatin.  Lithium.  Omeprazole.  Allopurinol.  Pseudoephedrine.  Amiodarone.  Packed red blood cells, when you get a blood transfusion.  Contrast media, such as when getting an imaging test (CT or CAT scan). This drug list is not all inclusive, but drug rashes have been reported with all the medicines listed above. Your caregiver will tell you which medicines to avoid. If you react to a medicine, a similar or worse reaction can occur the next time you take it. If you need to stop taking an antibiotic because of a drug rash, an alternative antibiotic may be needed to get rid of your infection. Antihistamine or cortisone drugs may be prescribed to help relieve your symptoms. Stay out of the sun until the rash is completely gone.  Be sure to let your caregiver know about your drug reaction. Do not take this medicine in the  future. Call your caregiver if your drug rash does not improve within 3 to 4 days. SEEK IMMEDIATE MEDICAL CARE IF:   You develop breathing problems, swelling in the throat, or wheezing.  You have weakness, fainting, fever, and muscle or joint pains.  You develop blisters or peeling of skin, especially around the mouth. Document Released: 03/14/2004 Document Revised: 06/21/2013 Document Reviewed: 12/24/2007 [facility] Patient Information 2015 [place], [place]. This information is not intended to replace advice given to you by your health care provider. Make sure you discuss any questions you have with your health care provider.

## 2014-07-20 NOTE — ED Provider Notes (Signed)
[name] is a 69 y.o. female who presents to Urgent Care today for face swelling and itching. Patient had a right total knee replacement performed on May 16 and was discharged from hospital on May 18. She was discharged with doxycycline, liquids, oxycodone, and Celebrex. She developed itching and swelling of her face yesterday. She denies any tongue swelling difficulty breathing or swallowing. She feels well otherwise with no fevers or chills nausea vomiting or diarrhea. She has not changed her medications or had any new soaps detergents or shampoos. She feels well otherwise.   Past Medical History  Diagnosis Date  . 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 Arthritis     Back   . Anemia   . GERD (gastroesophageal reflux disease)   . Colon polyp     Tubular Adenoma   . HOH (hard of hearing)   . Primary localized osteoarthritis of right knee   . History of right bundle branch block (RBBB)   . Cough productive of clear sputum 06/22/2014  . Acute upper respiratory infection 07/06/2014   Past Surgical History  Procedure Laterality Date  . Abdominal hysterectomy  2012  . Total knee arthroplasty  04/29/2011    Procedure: TOTAL KNEE ARTHROPLASTY;  Surgeon: Lorn Junes, MD;  Location: [place];  Service: Orthopedics;  Laterality: Left;  DR Carver THIS CASE  . Colonoscopy w/ biopsies    . Joint replacement    . Cholecystectomy N/A 03/09/2013    Procedure: LAPAROSCOPIC CHOLECYSTECTOMY;  Surgeon: Gayland Curry, MD;  Location: [place];  Service: General;  Laterality: N/A;  . Total knee arthroplasty Right 07/04/2014    Procedure: TOTAL KNEE ARTHROPLASTY;  Surgeon: Elsie Saas, MD;  Location: [place];  Service: Orthopedics;  Laterality: Right;   History  Substance Use Topics  . Smoking status: Former Smoker -- 1 years    Quit date: 04/22/1988  . Smokeless tobacco: Never Used  . Alcohol Use: No   ROS as above Medications: No current facility-administered medications for this encounter.    Current Outpatient Prescriptions  Medication Sig Dispense Refill  . acetaminophen (TYLENOL) 325 MG tablet Take 2 tablets (650 mg total) by mouth every 6 (six) hours as needed for mild pain (or Fever >/= 101).    Marland Kitchen apixaban (ELIQUIS) 2.5 MG TABS tablet Take 1 tablet (2.5 mg total) by mouth every 12 (twelve) hours. 60 tablet 0  . docusate sodium (COLACE) 100 MG capsule 1 tab 2 times a day while on narcotics.  STOOL SOFTENER 60 capsule 0  . doxycycline (VIBRA-TABS) 100 MG tablet Take 1 tablet (100 mg total) by mouth every 12 (twelve) hours. 20 tablet 0  . furosemide (LASIX) 20 MG tablet Take 20 mg by mouth daily as needed for fluid (Does not take if going out and about).    . mupirocin ointment (BACTROBAN) 2 % Place 1 application into the nose 2 (two) times daily. 22 g 0  . oxyCODONE (OXY IR/ROXICODONE) 5 MG immediate release tablet 1-2 tablets every 4-6 hrs as needed for pain 100 tablet 0  . pantoprazole (PROTONIX) 40 MG tablet Take 40 mg by mouth daily.    . polyethylene glycol (MIRALAX / GLYCOLAX) packet 17 grams in 8 ounces of water twice a day until bowel movements are regular 60 each 0  . predniSONE (STERAPRED UNI-PAK 21 TAB) 5 MG (21) TBPK tablet Take 1 tablet (5 mg total) by mouth daily. 6 day dosepack po 21 tablet 0  .  triamcinolone cream (KENALOG) 0.1 % Apply 1 application topically 2 (two) times daily. 60 g 1   No Known Allergies   Exam:  BP 146/80 mmHg  Pulse 78  Temp(Src) 98.6 F (37 C) (Oral)  Resp 16  SpO2 100% Gen: Well NAD HEENT: EOMI,  MMM mild facial swelling. No tongue or lip swelling. Lungs: Normal work of breathing. CTABL Heart: RRR no MRG Abd: NABS, Soft. Nondistended, Nontender Exts: Brisk capillary refill, warm and well perfused.  Skin: Maculopapular erythematous blanching rash on trunk and extremities  No results found for this or any previous visit (from the past 24 hour(s)). No results found.  Assessment and Plan: 69 y.o. female with rash likely drug  reaction. Celebrex is the most likely medication. Discontinue Celebrex start a short low-dose prednisone Dosepak and use triamcinolone cream. Follow-up with orthopedics as needed. Discussed the case with Elnita Maxwell the PA at [facility] who agrees.  Discussed warning signs or symptoms. Please see discharge instructions. Patient expresses understanding.     Gregor Hams, MD 07/20/14 [phone]

## 2014-07-21 ENCOUNTER — Ambulatory Visit: Payer: Medicare Other | Attending: Orthopedic Surgery | Admitting: Physical Therapy

## 2014-07-21 DIAGNOSIS — R29898 Other symptoms and signs involving the musculoskeletal system: Secondary | ICD-10-CM | POA: Insufficient documentation

## 2014-07-21 DIAGNOSIS — R609 Edema, unspecified: Secondary | ICD-10-CM | POA: Insufficient documentation

## 2014-07-21 DIAGNOSIS — M25561 Pain in right knee: Secondary | ICD-10-CM | POA: Insufficient documentation

## 2014-07-21 DIAGNOSIS — R269 Unspecified abnormalities of gait and mobility: Secondary | ICD-10-CM | POA: Insufficient documentation

## 2014-07-21 DIAGNOSIS — M25661 Stiffness of right knee, not elsewhere classified: Secondary | ICD-10-CM

## 2014-07-21 NOTE — Therapy (Signed)
[place], [place], [address] Phone: [phone]   Fax:  [phone]  Physical Therapy Treatment  Patient Details  Name: [name] MRN: [id number] Date of Birth: Dec 26, 1945 Referring Provider:  Elsie Saas, MD  Encounter Date: 07/21/2014      PT End of Session - 07/21/14 1014    Visit Number 1   Number of Visits 16   Date for PT Re-Evaluation 09/01/14   Authorization Type UHC/Medicaid    Authorization Time Period 09-01-14   PT Start Time 0932   PT Stop Time 1030   PT Time Calculation (min) 58 min   Activity Tolerance Patient tolerated treatment well   Behavior During Therapy [facility] for tasks assessed/performed      Past Medical History  Diagnosis Date  . 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 Arthritis     Back   . Anemia   . GERD (gastroesophageal reflux disease)   . Colon polyp     Tubular Adenoma   . HOH (hard of hearing)   . Primary localized osteoarthritis of right knee   . History of right bundle branch block (RBBB)   . Cough productive of clear sputum 06/22/2014  . Acute upper respiratory infection 07/06/2014    Past Surgical History  Procedure Laterality Date  . Abdominal hysterectomy  2012  . Total knee arthroplasty  04/29/2011    Procedure: TOTAL KNEE ARTHROPLASTY;  Surgeon: Lorn Junes, MD;  Location: [place];  Service: Orthopedics;  Laterality: Left;  DR [place] THIS CASE  . Colonoscopy w/ biopsies    . Joint replacement    . Cholecystectomy N/A 03/09/2013    Procedure: LAPAROSCOPIC CHOLECYSTECTOMY;  Surgeon: Gayland Curry, MD;  Location: [place];  Service: General;  Laterality: N/A;  . Total knee arthroplasty Right 07/04/2014    Procedure: TOTAL KNEE ARTHROPLASTY;  Surgeon: Elsie Saas, MD;  Location: [place];  Service: Orthopedics;  Laterality: Right;    There were no vitals filed for this visit.  Visit Diagnosis:  Pain in right knee  Abnormality of  gait  Decreased ROM of right knee  Edema      Subjective Assessment - 07/21/14 0940    Subjective Pt had R TKR on Jul 04, 2014.  Recently had a drug reaction to Celebrex and went to  Urgent night last night. HHPT came about 4 times   Pertinent History L TKR 2013   Limitations Standing;Walking  bending knee   How long can you sit comfortably? unlimited   How long can you stand comfortably? 1hour   Patient Stated Goals  bend the knee and walk without a cane, household chores   Currently in Pain? Yes   Pain Score 3   on medication   Pain Location Knee   Pain Orientation Right   Pain Descriptors / Indicators Aching   Pain Type Surgical pain   Pain Onset 1 to 4 weeks ago   Pain Frequency Intermittent   Aggravating Factors  bending it.   Pain Relieving Factors not moving            [facility] PT Assessment - 07/21/14 0946    Assessment   Medical Diagnosis R TKR   Onset Date/Surgical Date 07/04/14   Hand Dominance Right   Prior Therapy HHPT with [facility] about 4 visits   Precautions   Precautions None   Restrictions   Weight Bearing Restrictions Yes   RLE Weight  Bearing Weight bearing as tolerated   Balance Screen   Has the patient fallen in the past 6 months No   Has the patient had a decrease in activity level because of a fear of falling?  No   Is the patient reluctant to leave their home because of a fear of falling?  No   Home Environment   Living Environment Private residence   Living Arrangements Alone   Type of [place] to enter   Entrance Stairs-Number of Steps 3   Entrance Stairs-Rails Right   Home Layout One level   Prior Function   Level of Independence Independent;Independent with basic ADLs;Independent with household mobility without device;Independent with community mobility without device;Independent with homemaking with ambulation   Cognition   Overall Cognitive Status Within Functional Limits for tasks assessed    Observation/Other Assessments   Focus on Therapeutic Outcomes (FOTO)  Intake 57 liimitation43%  predicting 39% when you leave   Observation/Other Assessments-Edema    Edema Circumferential   Circumferential Edema   Circumferential - Right 62  above patellar sup pole/ below patellar pole R 49cm L 46 cm   Circumferential - Left  59.5cm   AROM   Right Knee Extension 3   Right Knee Flexion 81   Left Knee Extension 0   Left Knee Flexion 110   Strength   Right Hip Flexion 5/5   Right Hip ABduction 4/5   Left Hip Flexion 5/5   Left Hip ABduction 4/5   Right Knee Flexion 4/5   Right Knee Extension 4/5   Left Knee Flexion 5/5   Left Knee Extension 5/5   Ambulation/Gait   Ambulation/Gait Yes   Ambulation/Gait Assistance 6: Modified independent (Device/Increase time)   Ambulation Distance (Feet) 100 Feet   Assistive device Straight cane   Gait Pattern Decreased stance time - right;Decreased step length - left   Ambulation Surface Level   Gait velocity 2.05 ft/sec                     [facility] Adult PT Treatment/Exercise - 07/21/14 0946    Knee/Hip Exercises: Seated   Heel Slides 5 reps;Right;AROM;10 reps  x10 hold  5 sec   Heel Slides Limitations pain   Other Seated Knee Exercises seated knee flexion hold for 30 sec and increase flex by scooting forward x 3 30 sec hold   Knee/Hip Exercises: Supine   Other Supine Knee Exercises hip ext and knee flex Right off edge of bed for max 3 minu hold   Modalities   Modalities Cryotherapy   Cryotherapy   Number Minutes Cryotherapy 12 Minutes   Cryotherapy Location Knee  right   Type of Cryotherapy Ice pack                PT Education - 07/21/14 1009    Education provided Yes   Education Details Pt given initial HEP for sitting and bending R knee and stretching Hip extensor over edge of bed and heel slides for increasing bending. Educated on heat and cold therapy   Person(s) Educated Patient   Methods  Explanation;Demonstration;Verbal cues   Comprehension Verbalized understanding;Returned demonstration;Verbal cues required          PT Short Term Goals - 07/21/14 1014    PT SHORT TERM GOAL #1   Title "Independent with initial HEP   Time 4   Period Weeks   Status New   PT SHORT TERM GOAL #2  Title Pt will reduce from 5/10 to 3/10 with functional acitivities   Time 4   Period Weeks   Status New   PT SHORT TERM GOAL #3   Title "Demonstrate and verbalize understanding of condition management including RICE, positioning, use of A.D., HEP.    Time 4   Period Weeks   Status New           PT Long Term Goals - 08/04/14 1016    PT LONG TERM GOAL #1   Title "Pt will be independent with advanced HEP.    Time 8   Period Weeks   Status New   PT LONG TERM GOAL #2   Title "Pain will decrease to 1/10 with all functional activities   Time 8   Period Weeks   Status New   PT LONG TERM GOAL #3   Title "R knee AAROM flexion will improve to 2- 95 degrees for improved mobility to rise up and down from chair   Time 8   Period Weeks   Status New   PT LONG TERM GOAL #4   Title "FOTO will improve from 43% limitation   to  39% limitation   indicating improved functional mobility .    Time 8   Period Weeks   Status New   PT LONG TERM GOAL #5   Title Improve functional mobility in order to walk without assistive device at communitiy level 2.20ft/sec   Time 8   Period Weeks   Status New               Plan - August 04, 2014 1414    Clinical Impression Statement Pt is a  69year old female s/p  right TKA on  5  /  16 /15 by Dr   . Abbott Pao presents with impairments including pain, knee weakness, impaired ROMespecially in flexion, difficulty with walking, stairs, and with transfers . Pt would benefit from skilled PT for 2 times a week for 8 weeks to address above impariments and functional limitations and return to pain-free PLOF. and be able to walk with out an assistive device and walk without  pain   Pt will benefit from skilled therapeutic intervention in order to improve on the following deficits Abnormal gait;Decreased range of motion;Decreased strength;Difficulty walking;Pain   Rehab Potential Good   PT Frequency 2x / week   PT Duration 8 weeks   PT Treatment/Interventions ADLs/Self Care Home Management;Electrical Stimulation;Cryotherapy;Moist Heat;Ultrasound;Gait training;Stair training;Neuromuscular re-education;Therapeutic exercise;Functional mobility training;Patient/family education;Manual techniques;Taping;Dry needling;Passive range of motion;Scar mobilization   PT Next Visit Plan Continue ROM of knee with emphasis on Flexion with mobilization and soft tissue work. Progress exercise as able   Consulted and Agree with Plan of Care Patient          G-Codes - 2014-08-04 1101    Functional Assessment Tool Used FOTO   Functional Limitation Mobility: Walking and moving around   Mobility: Walking and Moving Around Current Status [phone]) At least 40 percent but less than 60 percent impaired, limited or restricted   Mobility: Walking and Moving Around Goal Status [phone]) At least 20 percent but less than 40 percent impaired, limited or restricted      Problem List Patient Active Problem List   Diagnosis Date Noted  . Acute upper respiratory infection 07/06/2014  . 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 DJD (degenerative joint disease) of knee 07/04/2014  . Primary localized osteoarthritis of right knee   . Arthritis   . Anemia   .  GERD (gastroesophageal reflux disease)   . Colon polyp   . HOH (hard of hearing)   . Postoperative anemia due to acute blood loss 05/01/2011  . 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    Voncille Lo, PT 07/21/2014 2:19 PM Phone: [phone] Fax: [facility] [address] [place], [place], [address] Phone: [phone]   Fax:   [phone]

## 2014-07-21 NOTE — Patient Instructions (Signed)
At home while watching TV, lie on back as in clinic on bed with Right leg over side and trying to flex/bend knee.  You can gently rub thigh to soften soft tissue as shown in clinic  Do exercises as shown on sheet for bending.  See knee: stretching into flexion   Sit in flat chair and scoot forward with foot planted to bend Right knee Hold for 30 sec to 60  Heel slides 2 x 10 2-3 times a day. To start  Use ice for pain control after exercise Use heat before exercise on your thigh above knee. Heat Therapy Heat therapy can help make painful, stiff muscles and joints feel better. Do not use heat on new injuries. Wait at least 48 hours after an injury to use heat. Do not use heat when you have aches or pains right after an activity. If you still have pain 3 hours after stopping the activity, then you may use heat. HOME CARE Wet heat pack  Soak a clean towel in warm water. Squeeze out the extra water.  Put the warm, wet towel in a plastic bag.  Place a thin, dry towel between your skin and the bag.  Put the heat pack on the area for 5 minutes, and check your skin. Your skin may be pink, but it should not be red.  Leave the heat pack on the area for 15 to 30 minutes.  Repeat this every 2 to 4 hours while awake. Do not use heat while you are sleeping. Warm water bath  Fill a tub with warm water.  Place the affected body part in the tub.  Soak the area for 20 to 40 minutes.  Repeat as needed. Hot water bottle  Fill the water bottle half full with hot water.  Press out the extra air. Close the cap tightly.  Place a dry towel between your skin and the bottle.heat  Put the bottle on the area for 5 minutes, and check your skin. Your skin may be pink, but it should not be red.  Leave the bottle on the area for 15 to 30 minutes.  Repeat this every 2 to 4 hours while awake. Electric heating pad  Place a dry towel between your skin and the heating pad.  Set the heating pad on low  heat.  Put the heating pad on the area for 10 minutes, and check your skin. Your skin may be pink, but it should not be red.  Leave the heating pad on the area for 20 to 40 minutes.  Repeat this every 2 to 4 hours while awake.  Do not lie on the heating pad.  Do not fall asleep while using the heating pad.  Do not use the heating pad near water. GET HELP RIGHT AWAY IF:  You get blisters or red skin.  Your skin is puffy (swollen), or you lose feeling (numbness) in the affected area.  You have any new problems.  Your problems are getting worse.  You have any questions or concerns. If you have any problems, stop using heat therapy until you see your doctor. MAKE SURE YOU:  Understand these instructions.  Will watch your condition.  Will get help right away if you are not doing well or get worse. Document Released: 04/29/2011 Document Reviewed: 03/30/2013 [facility] Patient Information 2015 [place]. This information is not intended to replace advice given to you by your health care provider. Make sure you discuss any questions you have with your  health care provider.  Cryotherapy Cryotherapy means treatment with cold. Ice or gel packs can be used to reduce both pain and swelling. Ice is the most helpful within the first 24 to 48 hours after an injury or flare-up from overusing a muscle or joint. Sprains, strains, spasms, burning pain, shooting pain, and aches can all be eased with ice. Ice can also be used when recovering from surgery. Ice is effective, has very few side effects, and is safe for most people to use. PRECAUTIONS  Ice is not a safe treatment option for people with:  Raynaud phenomenon. This is a condition affecting small blood vessels in the extremities. Exposure to cold may cause your problems to return.  Cold hypersensitivity. There are many forms of cold hypersensitivity, including:  Cold urticaria. Red, itchy hives appear on the skin when the tissues  begin to warm after being iced.  Cold erythema. This is a red, itchy rash caused by exposure to cold.  Cold hemoglobinuria. Red blood cells break down when the tissues begin to warm after being iced. The hemoglobin that carry oxygen are passed into the urine because they cannot combine with blood proteins fast enough.  Numbness or altered sensitivity in the area being iced. If you have any of the following conditions, do not use ice until you have discussed cryotherapy with your caregiver:  Heart conditions, such as arrhythmia, angina, or chronic heart disease.  High blood pressure.  Healing wounds or open skin in the area being iced.  Current infections.  Rheumatoid arthritis.  Poor circulation.  Diabetes. Ice slows the blood flow in the region it is applied. This is beneficial when trying to stop inflamed tissues from spreading irritating chemicals to surrounding tissues. However, if you expose your skin to cold temperatures for too long or without the proper protection, you can damage your skin or nerves. Watch for signs of skin damage due to cold. HOME CARE INSTRUCTIONS Follow these tips to use ice and cold packs safely.  Place a dry or damp towel between the ice and skin. A damp towel will cool the skin more quickly, so you may need to shorten the time that the ice is used.  For a more rapid response, add gentle compression to the ice.  Ice for no more than 10 to 20 minutes at a time. The bonier the area you are icing, the less time it will take to get the benefits of ice.  Check your skin after 5 minutes to make sure there are no signs of a poor response to cold or skin damage.  Rest 20 minutes or more between uses.  Once your skin is numb, you can end your treatment. You can test numbness by very lightly touching your skin. The touch should be so light that you do not see the skin dimple from the pressure of your fingertip. When using ice, most people will feel these normal  sensations in this order: cold, burning, aching, and numbness.  Do not use ice on someone who cannot communicate their responses to pain, such as small children or people with dementia. HOW TO MAKE AN ICE PACK Ice packs are the most common way to use ice therapy. Other methods include ice massage, ice baths, and cryosprays. Muscle creams that cause a cold, tingly feeling do not offer the same benefits that ice offers and should not be used as a substitute unless recommended by your caregiver. To make an ice pack, do one of the following:  Place crushed ice or a bag of frozen vegetables in a sealable plastic bag. Squeeze out the excess air. Place this bag inside another plastic bag. Slide the bag into a pillowcase or place a damp towel between your skin and the bag.  Mix 3 parts water with 1 part rubbing alcohol. Freeze the mixture in a sealable plastic bag. When you remove the mixture from the freezer, it will be slushy. Squeeze out the excess air. Place this bag inside another plastic bag. Slide the bag into a pillowcase or place a damp towel between your skin and the bag. SEEK MEDICAL CARE IF:  You develop white spots on your skin. This may give the skin a blotchy (mottled) appearance.  Your skin turns blue or pale.  Your skin becomes waxy or hard.  Your swelling gets worse. MAKE SURE YOU:   Understand these instructions.  Will watch your condition.  Will get help right away if you are not doing well or get worse. Document Released: 10/01/2010 Document Revised: 06/21/2013 Document Reviewed: 10/01/2010 [facility] Patient Information 2015 [place], [place]. This information is not intended to replace advice given to you by your health care provider. Make sure you discuss any questions you have with your health care provider.  Voncille Lo, PT 07/21/2014 10:09 AM Phone: [phone] Fax: [phone]

## 2014-08-01 ENCOUNTER — Ambulatory Visit: Payer: Medicare Other | Admitting: Physical Therapy

## 2014-08-01 DIAGNOSIS — R609 Edema, unspecified: Secondary | ICD-10-CM | POA: Diagnosis not present

## 2014-08-01 DIAGNOSIS — M25561 Pain in right knee: Secondary | ICD-10-CM | POA: Diagnosis not present

## 2014-08-01 DIAGNOSIS — R29898 Other symptoms and signs involving the musculoskeletal system: Secondary | ICD-10-CM | POA: Diagnosis not present

## 2014-08-01 DIAGNOSIS — R269 Unspecified abnormalities of gait and mobility: Secondary | ICD-10-CM | POA: Diagnosis not present

## 2014-08-01 DIAGNOSIS — M25661 Stiffness of right knee, not elsewhere classified: Secondary | ICD-10-CM

## 2014-08-01 NOTE — Therapy (Signed)
[place], [place], [address] Phone: [phone]   Fax:  [phone]  Physical Therapy Treatment  Patient Details  Name: [name] MRN: [id number] Date of Birth: 08/30/1945 Referring Provider:  Nolene Ebbs, MD  Encounter Date: 08/01/2014      PT End of Session - 08/01/14 1108    Visit Number 2   Number of Visits 16   Date for PT Re-Evaluation 09/01/14   PT Start Time 1020   PT Stop Time 1110   PT Time Calculation (min) 50 min   Activity Tolerance Patient tolerated treatment well   Behavior During Therapy [facility] for tasks assessed/performed      Past Medical History  Diagnosis Date  . 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 Arthritis     Back   . Anemia   . GERD (gastroesophageal reflux disease)   . Colon polyp     Tubular Adenoma   . HOH (hard of hearing)   . Primary localized osteoarthritis of right knee   . History of right bundle branch block (RBBB)   . Cough productive of clear sputum 06/22/2014  . Acute upper respiratory infection 07/06/2014    Past Surgical History  Procedure Laterality Date  . Abdominal hysterectomy  2012  . Total knee arthroplasty  04/29/2011    Procedure: TOTAL KNEE ARTHROPLASTY;  Surgeon: Lorn Junes, MD;  Location: [place];  Service: Orthopedics;  Laterality: Left;  DR [place] THIS CASE  . Colonoscopy w/ biopsies    . Joint replacement    . Cholecystectomy N/A 03/09/2013    Procedure: LAPAROSCOPIC CHOLECYSTECTOMY;  Surgeon: Gayland Curry, MD;  Location: [place];  Service: General;  Laterality: N/A;  . Total knee arthroplasty Right 07/04/2014    Procedure: TOTAL KNEE ARTHROPLASTY;  Surgeon: Elsie Saas, MD;  Location: [place];  Service: Orthopedics;  Laterality: Right;    There were no vitals filed for this visit.  Visit Diagnosis:  Pain in right knee  Abnormality of gait  Decreased ROM of right knee  Edema      Subjective Assessment - 08/01/14  1033    Subjective Sore, stiff today, 3/10 pain in front of knee, has done her ex a little bit.  Went to a [employer], was able to walk around to reduce stiffness.     Currently in Pain? Yes   Pain Score 3    Pain Location Knee   Pain Orientation Right   Pain Descriptors / Indicators Aching;Tightness   Pain Type Surgical pain   Pain Onset More than a month ago   Pain Frequency Intermittent            [facility] PT Assessment - 08/01/14 1102    AROM   Right Knee Flexion 90          [facility] Adult PT Treatment/Exercise - 08/01/14 1035    Knee/Hip Exercises: Aerobic   Stationary Bike NuStep L5, UE and LE , 7 min    Knee/Hip Exercises: Supine   Quad Sets Strengthening;Both;1 set;20 reps;Other (comment)   Quad Sets Limitations done also on L knee for feedback, very weak quad set on Rt.    Short [employer] Strengthening;Right;1 set;20 reps   Heel Slides AAROM;Strengthening;Right;1 set;10 reps   Straight Leg Raises Strengthening;Right;1 set;10 reps;Other (comment)   Straight Leg Raises Limitations 2   Straight Leg Raise with External Rotation Strengthening;Right;1 set;10 reps;Other (comment)   Straight Leg Raise with  External Rotation Limitations 2   Patellar Mobs by PT, superior/inf and lateral   Knee Flexion AAROM;Right;1 set;10 reps   Other Supine Knee Exercises hamstring stretch with sheet x 3    Cryotherapy   Number Minutes Cryotherapy 15 Minutes   Cryotherapy Location Knee  right   Type of Cryotherapy Other (comment)  vaso   Manual Therapy   Manual Therapy Joint mobilization;Soft tissue mobilization;Taping   Joint Mobilization Gr. 1-2 flexion mob   Soft tissue mobilization gentle lateral aspect of knee     Reported Rt. Leg felt great after Vaso!           PT Education - 08/01/14 1107    Education provided Yes   Education Details HEP reinforcement   Person(s) Educated Patient   Methods Explanation;Verbal cues   Comprehension Verbalized  understanding;Returned demonstration          PT Short Term Goals - 08/01/14 1109    PT SHORT TERM GOAL #1   Title "Independent with initial HEP   Status On-going   PT SHORT TERM GOAL #2   Title Pt will reduce from 5/10 to 3/10 with functional acitivities   Status On-going   PT SHORT TERM GOAL #3   Title "Demonstrate and verbalize understanding of condition management including RICE, positioning, use of A.D., HEP.    Status On-going           PT Long Term Goals - 08/01/14 1110    PT LONG TERM GOAL #1   Title "Pt will be independent with advanced HEP.    Status On-going   PT LONG TERM GOAL #2   Title "Pain will decrease to 1/10 with all functional activities   Status On-going   PT LONG TERM GOAL #3   Title "R knee AAROM flexion will improve to 2- 95 degrees for improved mobility to rise up and down from chair   Status On-going   PT LONG TERM GOAL #4   Title "FOTO will improve from 43% limitation   to  39% limitation   indicating improved functional mobility .    Status On-going   PT LONG TERM GOAL #5   Title Improve functional mobility in order to walk without assistive device at communitiy level 2.27f/sec   Status On-going               Plan - 08/01/14 1108    Clinical Impression Statement Patient tolerated session well, no goals met as it was her 1st visit.  AROMRt. knee flexion noticeably improved.    PT Next Visit Plan Continue ROM of knee with emphasis on Flexion with mobilization and soft tissue work. Progress exercise as able   PT Home Exercise Plan try taping for edema (asked to avoid lotion on R.t knee), cont with mat level strengthening   Consulted and Agree with Plan of Care Patient        Problem List Patient Active Problem List   Diagnosis Date Noted  . Acute upper respiratory infection 07/06/2014  . 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 DJD (degenerative joint disease) of knee 07/04/2014  . Primary  localized osteoarthritis of right knee   . Arthritis   . Anemia   . GERD (gastroesophageal reflux disease)   . Colon polyp   . HOH (hard of hearing)   . Postoperative anemia due to acute blood loss 05/01/2011  . 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

## 2014-08-01 NOTE — Patient Instructions (Signed)
Quad sets, asked her to do bilateral and simultaneously to re-education Rt. LE.

## 2014-08-02 DIAGNOSIS — Z96651 Presence of right artificial knee joint: Secondary | ICD-10-CM | POA: Diagnosis not present

## 2014-08-03 ENCOUNTER — Ambulatory Visit: Payer: Medicare Other | Admitting: Physical Therapy

## 2014-08-03 DIAGNOSIS — M25561 Pain in right knee: Secondary | ICD-10-CM

## 2014-08-03 DIAGNOSIS — R269 Unspecified abnormalities of gait and mobility: Secondary | ICD-10-CM | POA: Diagnosis not present

## 2014-08-03 DIAGNOSIS — M25661 Stiffness of right knee, not elsewhere classified: Secondary | ICD-10-CM

## 2014-08-03 DIAGNOSIS — R29898 Other symptoms and signs involving the musculoskeletal system: Secondary | ICD-10-CM | POA: Diagnosis not present

## 2014-08-03 DIAGNOSIS — R609 Edema, unspecified: Secondary | ICD-10-CM

## 2014-08-03 NOTE — Therapy (Signed)
[place], [place], [address] Phone: [phone]   Fax:  [phone]  Physical Therapy Treatment  Patient Details  Name: [name] MRN: [id number] Date of Birth: 1945-04-09 Referring Provider:  Nolene Ebbs, MD  Encounter Date: 08/03/2014      PT End of Session - 08/03/14 1623    Visit Number 3   Number of Visits 16   Date for PT Re-Evaluation 09/01/14   PT Start Time 1330   PT Stop Time 1420   PT Time Calculation (min) 50 min   Activity Tolerance Patient tolerated treatment well   Behavior During Therapy [facility] for tasks assessed/performed      Past Medical History  Diagnosis Date  . 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 Arthritis     Back   . Anemia   . GERD (gastroesophageal reflux disease)   . Colon polyp     Tubular Adenoma   . HOH (hard of hearing)   . Primary localized osteoarthritis of right knee   . History of right bundle branch block (RBBB)   . Cough productive of clear sputum 06/22/2014  . Acute upper respiratory infection 07/06/2014    Past Surgical History  Procedure Laterality Date  . Abdominal hysterectomy  2012  . Total knee arthroplasty  04/29/2011    Procedure: TOTAL KNEE ARTHROPLASTY;  Surgeon: Lorn Junes, MD;  Location: [place];  Service: Orthopedics;  Laterality: Left;  DR [place] THIS CASE  . Colonoscopy w/ biopsies    . Joint replacement    . Cholecystectomy N/A 03/09/2013    Procedure: LAPAROSCOPIC CHOLECYSTECTOMY;  Surgeon: Gayland Curry, MD;  Location: [place];  Service: General;  Laterality: N/A;  . Total knee arthroplasty Right 07/04/2014    Procedure: TOTAL KNEE ARTHROPLASTY;  Surgeon: Elsie Saas, MD;  Location: [place];  Service: Orthopedics;  Laterality: Right;    There were no vitals filed for this visit.  Visit Diagnosis:  Pain in right knee  Decreased ROM of right knee  Edema  Abnormality of gait      Subjective Assessment - 08/03/14  1342    Subjective Mid incision pain.0 now to3/10  took 1 pain pill before PT      aches.                    [place] Adult PT Treatment/Exercise - 08/03/14 1343    Knee/Hip Exercises: Stretches   Quad Stretch 10 seconds  10 reps, foot on step flexion stretch as knee moves over toe   Knee/Hip Exercises: Aerobic   Stationary Bike Nustep L4  6 minutes   Knee/Hip Exercises: Standing   Forward Step Up Right;1 set;Hand Hold: 1;Step Height: 6"   Knee/Hip Exercises: Supine   Heel Slides 10 reps   Heel Slides Limitations 91 degrees AROM   Manual Therapy   Manual Therapy Joint mobilization;Soft tissue mobilization;Taping   Manual therapy comments strap used for flexion   ROCK tool used,    Kinesiotex Edema   Kinesiotix   Edema Medial lateral knee                  PT Short Term Goals - 08/01/14 1109    PT SHORT TERM GOAL #1   Title "Independent with initial HEP   Status On-going   PT SHORT TERM GOAL #2   Title Pt will reduce from 5/10 to 3/10 with functional acitivities   Status On-going  PT SHORT TERM GOAL #3   Title "Demonstrate and verbalize understanding of condition management including RICE, positioning, use of A.D., HEP.    Status On-going           PT Long Term Goals - 08/01/14 1110    PT LONG TERM GOAL #1   Title "Pt will be independent with advanced HEP.    Status On-going   PT LONG TERM GOAL #2   Title "Pain will decrease to 1/10 with all functional activities   Status On-going   PT LONG TERM GOAL #3   Title "R knee AAROM flexion will improve to 2- 95 degrees for improved mobility to rise up and down from chair   Status On-going   PT LONG TERM GOAL #4   Title "FOTO will improve from 43% limitation   to  39% limitation   indicating improved functional mobility .    Status On-going   PT LONG TERM GOAL #5   Title Improve functional mobility in order to walk without assistive device at communitiy level 2.51ft/sec   Status On-going                Plan - 08/03/14 1623    Clinical Impression Statement Able to begin some closed cjhain exercises,  manual still needed to assist ROM and edems control.  Patient making progress toward home exercise goals   PT Next Visit Plan Continue ROM of knee with emphasis on Flexion with mobilization and soft tissue work. Progress exercise as able   Consulted and Agree with Plan of Care Patient    Try wall sits, continue step ups  , mini squats heel lifts calf stretch.    Problem List Patient Active Problem List   Diagnosis Date Noted  . Acute upper respiratory infection 07/06/2014  . 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 DJD (degenerative joint disease) of knee 07/04/2014  . Primary localized osteoarthritis of right knee   . Arthritis   . Anemia   . GERD (gastroesophageal reflux disease)   . Colon polyp   . HOH (hard of hearing)   . Postoperative anemia due to acute blood loss 05/01/2011  . 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

## 2014-08-08 ENCOUNTER — Ambulatory Visit: Payer: Medicare Other | Admitting: Physical Therapy

## 2014-08-08 DIAGNOSIS — R609 Edema, unspecified: Secondary | ICD-10-CM | POA: Diagnosis not present

## 2014-08-08 DIAGNOSIS — R29898 Other symptoms and signs involving the musculoskeletal system: Secondary | ICD-10-CM | POA: Diagnosis not present

## 2014-08-08 DIAGNOSIS — M25661 Stiffness of right knee, not elsewhere classified: Secondary | ICD-10-CM

## 2014-08-08 DIAGNOSIS — M25561 Pain in right knee: Secondary | ICD-10-CM | POA: Diagnosis not present

## 2014-08-08 DIAGNOSIS — R269 Unspecified abnormalities of gait and mobility: Secondary | ICD-10-CM | POA: Diagnosis not present

## 2014-08-08 NOTE — Therapy (Signed)
[place], [place], [address] Phone: [phone]   Fax:  [phone]  Physical Therapy Treatment  Patient Details  Name: [name] MRN: [id number] Date of Birth: [birth year] Referring Provider:  Nolene Ebbs, MD  Encounter Date: 08/08/2014      PT End of Session - 08/08/14 1015    Visit Number 4   Number of Visits 16   Date for PT Re-Evaluation 09/01/14   PT Start Time 0928   PT Stop Time 1015   PT Time Calculation (min) 47 min   Activity Tolerance Patient tolerated treatment well   Behavior During Therapy [facility] for tasks assessed/performed      Past Medical History  Diagnosis Date  . 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 Arthritis     Back   . Anemia   . GERD (gastroesophageal reflux disease)   . Colon polyp     Tubular Adenoma   . HOH (hard of hearing)   . Primary localized osteoarthritis of right knee   . History of right bundle branch block (RBBB)   . Cough productive of clear sputum 06/22/2014  . Acute upper respiratory infection 07/06/2014    Past Surgical History  Procedure Laterality Date  . Abdominal hysterectomy  2012  . Total knee arthroplasty  04/29/2011    Procedure: TOTAL KNEE ARTHROPLASTY;  Surgeon: Lorn Junes, MD;  Location: [place];  Service: Orthopedics;  Laterality: Left;  DR [place] THIS CASE  . Colonoscopy w/ biopsies    . Joint replacement    . Cholecystectomy N/A 03/09/2013    Procedure: LAPAROSCOPIC CHOLECYSTECTOMY;  Surgeon: Gayland Curry, MD;  Location: [place];  Service: General;  Laterality: N/A;  . Total knee arthroplasty Right 07/04/2014    Procedure: TOTAL KNEE ARTHROPLASTY;  Surgeon: Elsie Saas, MD;  Location: [place];  Service: Orthopedics;  Laterality: Right;    There were no vitals filed for this visit.  Visit Diagnosis:  Decreased ROM of right knee  Abnormality of gait  Edema      Subjective Assessment - 08/08/14 0930    Subjective  Feeling stronger and stronger.  Pain OK.Has some pain pain 4/10 with doing regural thingsl.  2/10 now.  Doing exercises and on her feet more.    Currently in Pain? Yes   Pain Score 2    Pain Location Knee   Pain Orientation Right;Anterior   Pain Descriptors / Indicators --  tender   Aggravating Factors  extra time on feet    Pain Relieving Factors rest elevation medication   Multiple Pain Sites No                         [facility] Adult PT Treatment/Exercise - 08/08/14 0938    Knee/Hip Exercises: Stretches   Quad Stretch 10 seconds  10 reps on step   Knee/Hip Exercises: Aerobic   Stationary Bike Nustep L5, minutes 11   Knee/Hip Exercises: Standing   Heel Raises 15 reps  both   Forward Step Up Right;2 sets;10 reps;Hand Hold: 2;Step Height: 6"  cued   Functional Squat 10 reps  cued for technique   Wall Squat 15 reps   Knee/Hip Exercises: Seated   Heel Slides 10 reps  toe lift for extra stretch   Other Seated Knee Exercises green band for ankle IV/EV 10 reps   Manual Therapy   Manual Therapy Joint mobilization   Manual therapy  comments Stpap with movement as previous   Knee/Hip Exercises: Machines for Strengthening   Cybex Knee Flexion 2 plates 2 and o leg, eccentric 10 reps each   Total Gym Leg Press --  tried but could not do cough with lying down.                  PT Short Term Goals - 08/01/14 1109    PT SHORT TERM GOAL #1   Title "Independent with initial HEP   Status On-going   PT SHORT TERM GOAL #2   Title Pt will reduce from 5/10 to 3/10 with functional acitivities   Status On-going   PT SHORT TERM GOAL #3   Title "Demonstrate and verbalize understanding of condition management including RICE, positioning, use of A.D., HEP.    Status On-going           PT Long Term Goals - 08/01/14 1110    PT LONG TERM GOAL #1   Title "Pt will be independent with advanced HEP.    Status On-going   PT LONG TERM GOAL #2   Title "Pain will decrease  to 1/10 with all functional activities   Status On-going   PT LONG TERM GOAL #3   Title "R knee AAROM flexion will improve to 2- 95 degrees for improved mobility to rise up and down from chair   Status On-going   PT LONG TERM GOAL #4   Title "FOTO will improve from 43% limitation   to  39% limitation   indicating improved functional mobility .    Status On-going   PT LONG TERM GOAL #5   Title Improve functional mobility in order to walk without assistive device at communitiy level 2.66ft/sec   Status On-going               Plan - 08/08/14 1016    Clinical Impression Statement 100 degrees AROM post ex. Less limp with walking.  Could not lay down due to cough, even with wedge.   PT Next Visit Plan Try steps more closed chain and stretching   Consulted and Agree with Plan of Care Patient        Problem List Patient Active Problem List   Diagnosis Date Noted  . Acute upper respiratory infection 07/06/2014  . 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 DJD (degenerative joint disease) of knee 07/04/2014  . Primary localized osteoarthritis of right knee   . Arthritis   . Anemia   . GERD (gastroesophageal reflux disease)   . Colon polyp   . HOH (hard of hearing)   . Postoperative anemia due to acute blood loss 05/01/2011  . 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 [place], [place], [address] Phone: [phone]   Fax:  [phone]     Melvenia Needles, PTA 08/08/2014 10:18 AM Phone: [phone] Fax: [phone]

## 2014-08-11 ENCOUNTER — Ambulatory Visit: Payer: Medicare Other | Admitting: Physical Therapy

## 2014-08-11 DIAGNOSIS — M25561 Pain in right knee: Secondary | ICD-10-CM

## 2014-08-11 DIAGNOSIS — R609 Edema, unspecified: Secondary | ICD-10-CM | POA: Diagnosis not present

## 2014-08-11 DIAGNOSIS — R269 Unspecified abnormalities of gait and mobility: Secondary | ICD-10-CM

## 2014-08-11 DIAGNOSIS — M25661 Stiffness of right knee, not elsewhere classified: Secondary | ICD-10-CM

## 2014-08-11 DIAGNOSIS — R29898 Other symptoms and signs involving the musculoskeletal system: Secondary | ICD-10-CM | POA: Diagnosis not present

## 2014-08-11 NOTE — Therapy (Signed)
[place], [place], [address] Phone: [phone]   Fax:  [phone]  Physical Therapy Treatment  Patient Details  Name: [name] MRN: [id number] Date of Birth: 10-19-1945 Referring Provider:  Nolene Ebbs, MD  Encounter Date: 08/11/2014      PT End of Session - 08/11/14 1016    Visit Number 5   Number of Visits 16   Date for PT Re-Evaluation 09/01/14   Authorization Type UHC/Medicare   Authorization Time Period 09-01-14   PT Start Time 1016   PT Stop Time 1111   PT Time Calculation (min) 55 min   Activity Tolerance Patient tolerated treatment well   Behavior During Therapy [facility] for tasks assessed/performed      Past Medical History  Diagnosis Date  . 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 Arthritis     Back   . Anemia   . GERD (gastroesophageal reflux disease)   . Colon polyp     Tubular Adenoma   . HOH (hard of hearing)   . Primary localized osteoarthritis of right knee   . History of right bundle branch block (RBBB)   . Cough productive of clear sputum 06/22/2014  . Acute upper respiratory infection 07/06/2014    Past Surgical History  Procedure Laterality Date  . Abdominal hysterectomy  2012  . Total knee arthroplasty  04/29/2011    Procedure: TOTAL KNEE ARTHROPLASTY;  Surgeon: Lorn Junes, MD;  Location: [place];  Service: Orthopedics;  Laterality: Left;  DR [place] THIS CASE  . Colonoscopy w/ biopsies    . Joint replacement    . Cholecystectomy N/A 03/09/2013    Procedure: LAPAROSCOPIC CHOLECYSTECTOMY;  Surgeon: Gayland Curry, MD;  Location: [place];  Service: General;  Laterality: N/A;  . Total knee arthroplasty Right 07/04/2014    Procedure: TOTAL KNEE ARTHROPLASTY;  Surgeon: Elsie Saas, MD;  Location: [place];  Service: Orthopedics;  Laterality: Right;    There were no vitals filed for this visit.  Visit Diagnosis:  Decreased ROM of right knee  Abnormality of  gait  Edema  Pain in right knee      Subjective Assessment - 08/11/14 1017    Subjective Feeling stronger, entering clinic without cane.  Walking slowly and steadily   How long can you sit comfortably? unlimited   Patient Stated Goals  bend the knee and walk without a cane, household chores   Currently in Pain? Yes   Pain Score 2    Pain Location Knee   Pain Orientation Right;Anterior   Pain Descriptors / Indicators Aching;Sharp   Pain Type Surgical pain   Pain Onset More than a month ago   Pain Frequency Intermittent            [facility] PT Assessment - 08/11/14 1058    AROM   Right Knee Extension 3   Right Knee Flexion 100   Left Knee Extension 0   Left Knee Flexion 110                     [facility] Adult PT Treatment/Exercise - 08/11/14 1032    Ambulation/Gait   Gait velocity 2.18 ft sec   without cane   Knee/Hip Exercises: Standing   Heel Raises 15 reps  bilateral   Forward Step Up Right;2 sets;10 reps;Hand Hold: 2;Step Height: 6"  cued   Wall Squat 10 reps;2 sets  holding onto chair   Cryotherapy  Number Minutes Cryotherapy 10 Minutes   Cryotherapy Location Knee   Type of Cryotherapy Ice pack   Manual Therapy   Manual Therapy Joint mobilization   Joint Mobilization joint mobs for increasing R knee flex  grade 2/3 as tolerated  utilized strap and anterior translation and distracti tibia   Soft tissue mobilization scar tissue massage with IASTYM gentle                PT Education - 08/11/14 1059    Education provided Yes   Education Details HEP for wall sit, sit to stand and stepups while holding onto chair or rail   Person(s) Educated Patient   Methods Explanation;Demonstration;Tactile cues;Verbal cues;Handout   Comprehension Verbalized understanding;Returned demonstration          PT Short Term Goals - 08/11/14 1020    PT SHORT TERM GOAL #1   Title "Independent with initial HEP   Time 4   Period Weeks   Status Achieved   PT  SHORT TERM GOAL #2   Title Pt will reduce from 5/10 to 3/10 with functional acitivities   Baseline Pt at 2/10 at rest   Time 4   Period Weeks   Status On-going   PT SHORT TERM GOAL #3   Title "Demonstrate and verbalize understanding of condition management including RICE, positioning, use of A.D., HEP.    Baseline uses cane when out and going to church   Time 4   Period Weeks   Status Achieved           PT Long Term Goals - 08/11/14 1021    PT LONG TERM GOAL #1   Title "Pt will be independent with advanced HEP.    Time 8   Period Weeks   Status On-going   PT LONG TERM GOAL #2   Title "Pain will decrease to 1/10 with all functional activities   Time 8   Period Weeks   Status On-going   PT LONG TERM GOAL #3   Title "R knee AAROM flexion will improve to 2- 95 degrees for improved mobility to rise up and down from chair   Time 8   Period Weeks   Status On-going   PT LONG TERM GOAL #4   Title "FOTO will improve from 43% limitation   to  39% limitation   indicating improved functional mobility .    Time 8   Period Weeks   Status On-going   PT LONG TERM GOAL #5   Title Improve functional mobility in order to walk without assistive device at communitiy level 2.35ft/sec   Time 8   Period Weeks   Status On-going               Plan - 08/11/14 1101    Clinical Impression Statement 100 degrees AROM post exercise and scar tissue, STM.  Pt enters clinic with no cane and slow and deliberate gait.  Pt had to lie supine  with wedge for nasal congestion and comfort. Pt ambulates in clinic without cane at 2.18 ft/sec and widened base of support.  Pt with increased tenderness around scar tissue and was encouraged to do scar tissue massage at home. Pt is independent with HEP initial and is aware of how to use RICE, AD, postioning, STG #1 and 3 achieved   Pt will benefit from skilled therapeutic intervention in order to improve on the following deficits Abnormal gait;Decreased range  of motion;Decreased strength;Difficulty walking;Pain   Rehab Potential Good  PT Frequency 2x / week   PT Duration 8 weeks   PT Treatment/Interventions ADLs/Self Care Home Management;Electrical Stimulation;Cryotherapy;Moist Heat;Ultrasound;Gait training;Stair training;Neuromuscular re-education;Therapeutic exercise;Functional mobility training;Patient/family education;Manual techniques;Taping;Dry needling;Passive range of motion;Scar mobilization   PT Next Visit Plan Progress steps and closed chain .  Also flexion knee mobilization. try mulligan technique to increase flexion on table   PT Home Exercise Plan progress closed chain exercise as tolerated.   Consulted and Agree with Plan of Care Patient        Problem List Patient Active Problem List   Diagnosis Date Noted  . Acute upper respiratory infection 07/06/2014  . 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 DJD (degenerative joint disease) of knee 07/04/2014  . Primary localized osteoarthritis of right knee   . Arthritis   . Anemia   . GERD (gastroesophageal reflux disease)   . Colon polyp   . HOH (hard of hearing)   . Postoperative anemia due to acute blood loss 05/01/2011  . 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 [place], [place], [address] Phone: [phone]   Fax:  [phone]

## 2014-08-11 NOTE — Patient Instructions (Signed)
SIT TO STAND: No Device   Sit with feet shoulder-width apart, on floor. Lean chest forward, raise hips up from surface. Straighten hips and knees. Weight bear equally on left and right sides. [name], you may use a chair in front to hold onto.  Slowly rise and slowly sit.  Do not brace your self against the mat. Or another chair. _10__ reps per set, _2-3__ sets per day, __7_ days per week Place left leg closer to sitting surface.  Copyright  VHI. All rights reserved.   Body-Weight Step-Up: Stable (Active)   Head up, back flat, place one foot on step. Bring other leg up on to step. Complete __2_ sets of _10__ repetitions. Perform 1-2___ sessions per day. Hold onto rail or chair for safety.  http://gtsc.exer.us/512   Copyright  VHI. All rights reserved.  Functional Quadriceps: Sit to Stand   Copyright  VHI. All rights reserved.  Strengthening: Wall Slide   Leaning on wall, slowly lower buttocks until thighs are parallel to floor. Hold __5-10_ seconds. Tighten thigh muscles and return. Repeat _10___ times per set. Do __2__ sets per session. Do _1___ sessions per day.  http://orth.exer.us/630   Copyright  VHI. All rights reserved.  [name], PT 08/11/2014 10:29 AM Phone: [phone] Fax: [phone]

## 2014-08-12 ENCOUNTER — Emergency Department (HOSPITAL_COMMUNITY): Payer: Medicare Other

## 2014-08-12 ENCOUNTER — Encounter (HOSPITAL_COMMUNITY): Payer: Self-pay | Admitting: *Deleted

## 2014-08-12 DIAGNOSIS — Z7952 Long term (current) use of systemic steroids: Secondary | ICD-10-CM | POA: Diagnosis not present

## 2014-08-12 DIAGNOSIS — K219 Gastro-esophageal reflux disease without esophagitis: Secondary | ICD-10-CM | POA: Insufficient documentation

## 2014-08-12 DIAGNOSIS — M1990 Unspecified osteoarthritis, unspecified site: Secondary | ICD-10-CM | POA: Diagnosis not present

## 2014-08-12 DIAGNOSIS — Z86010 Personal history of colonic polyps: Secondary | ICD-10-CM | POA: Diagnosis not present

## 2014-08-12 DIAGNOSIS — R0789 Other chest pain: Secondary | ICD-10-CM | POA: Insufficient documentation

## 2014-08-12 DIAGNOSIS — M1711 Unilateral primary osteoarthritis, right knee: Secondary | ICD-10-CM | POA: Diagnosis not present

## 2014-08-12 DIAGNOSIS — Z862 Personal history of diseases of the blood and blood-forming organs and certain disorders involving the immune mechanism: Secondary | ICD-10-CM | POA: Diagnosis not present

## 2014-08-12 DIAGNOSIS — R05 Cough: Secondary | ICD-10-CM | POA: Insufficient documentation

## 2014-08-12 DIAGNOSIS — R0602 Shortness of breath: Secondary | ICD-10-CM | POA: Insufficient documentation

## 2014-08-12 DIAGNOSIS — R072 Precordial pain: Secondary | ICD-10-CM | POA: Diagnosis not present

## 2014-08-12 DIAGNOSIS — J9811 Atelectasis: Secondary | ICD-10-CM | POA: Diagnosis not present

## 2014-08-12 DIAGNOSIS — Z87891 Personal history of nicotine dependence: Secondary | ICD-10-CM | POA: Diagnosis not present

## 2014-08-12 LAB — I-STAT TROPONIN, ED: Troponin i, poc: 0 ng/mL (ref 0.00–0.08)

## 2014-08-12 LAB — BASIC METABOLIC PANEL
Anion gap: 10 (ref 5–15)
BUN: 9 mg/dL (ref 6–20)
CO2: 23 mmol/L (ref 22–32)
Calcium: 9.2 mg/dL (ref 8.9–10.3)
Chloride: 105 mmol/L (ref 101–111)
Creatinine, Ser: 0.82 mg/dL (ref 0.44–1.00)
GFR calc Af Amer: >60 mL/min (ref >60)
GFR calc non Af Amer: >60 mL/min (ref >60)
Glucose, Bld: 96 mg/dL (ref 65–99)
Potassium: 2.9 mmol/L — ABNORMAL LOW (ref 3.5–5.1)
Sodium: 138 mmol/L (ref 135–145)

## 2014-08-12 LAB — CBC
HCT: 33.6 % — ABNORMAL LOW (ref 36.0–46.0)
Hemoglobin: 10.8 g/dL — ABNORMAL LOW (ref 12.0–15.0)
MCH: 26.9 pg (ref 26.0–34.0)
MCHC: 32.1 g/dL (ref 30.0–36.0)
MCV: 83.6 fL (ref 78.0–100.0)
Platelets: 344 10*3/uL (ref 150–400)
RBC: 4.02 MIL/uL (ref 3.87–5.11)
RDW: 14.4 % (ref 11.5–15.5)
WBC: 4.9 10*3/uL (ref 4.0–10.5)

## 2014-08-12 LAB — BRAIN NATRIURETIC PEPTIDE: B Natriuretic Peptide: 36.7 pg/mL (ref 0.0–100.0)

## 2014-08-12 NOTE — ED Notes (Signed)
Pt taken to xray 

## 2014-08-12 NOTE — ED Notes (Signed)
Pt c/o SOB and indigestion x 2 days. States that she has chest tightness.

## 2014-08-13 ENCOUNTER — Emergency Department (HOSPITAL_COMMUNITY)
Admission: EM | Admit: 2014-08-13 | Discharge: 2014-08-13 | Disposition: A | Discharge status: Home / Self Care | Payer: Medicare Other | Attending: Emergency Medicine | Admitting: Emergency Medicine

## 2014-08-13 ENCOUNTER — Encounter (HOSPITAL_COMMUNITY): Payer: Self-pay

## 2014-08-13 ENCOUNTER — Emergency Department (HOSPITAL_COMMUNITY): Payer: Medicare Other

## 2014-08-13 DIAGNOSIS — R0602 Shortness of breath: Secondary | ICD-10-CM

## 2014-08-13 DIAGNOSIS — R05 Cough: Secondary | ICD-10-CM

## 2014-08-13 DIAGNOSIS — R059 Cough, unspecified: Secondary | ICD-10-CM

## 2014-08-13 DIAGNOSIS — K219 Gastro-esophageal reflux disease without esophagitis: Secondary | ICD-10-CM

## 2014-08-13 DIAGNOSIS — J9811 Atelectasis: Secondary | ICD-10-CM | POA: Diagnosis not present

## 2014-08-13 HISTORY — DX: Essential (primary) hypertension: I10

## 2014-08-13 LAB — I-STAT TROPONIN, ED: Troponin i, poc: 0 ng/mL (ref 0.00–0.08)

## 2014-08-13 MED ORDER — GI COCKTAIL ~~LOC~~: 10.0000 mL | Freq: Two times a day (BID) | ORAL | Status: DC | PRN

## 2014-08-13 MED ORDER — POTASSIUM CHLORIDE CRYS ER 20 MEQ PO TBCR
40.0000 meq | EXTENDED_RELEASE_TABLET | Freq: Once | ORAL | Status: AC
  Administered 2014-08-13: 40 meq via ORAL
  Filled 2014-08-13: qty 2

## 2014-08-13 MED ORDER — IOHEXOL 350 MG/ML SOLN
80.0000 mL | Freq: Once | INTRAVENOUS | Status: AC | PRN
  Administered 2014-08-13: 80 mL via INTRAVENOUS

## 2014-08-13 MED ORDER — GI COCKTAIL ~~LOC~~
15.0000 mL | Freq: Once | ORAL | Status: AC
  Administered 2014-08-13: 15 mL via ORAL
  Filled 2014-08-13: qty 30

## 2014-08-13 NOTE — Discharge Instructions (Signed)
Take GI cocktail as needed for indigestion. Refer to attached documents for more information. Follow up with your doctor for further evaluation.

## 2014-08-13 NOTE — ED Provider Notes (Signed)
CSN: [id number]     Arrival date & time 08/12/14  2244 History   First MD Initiated Contact with Patient 08/13/14 0110     Chief Complaint  Patient presents with  . Shortness of Breath     (Consider location/radiation/quality/duration/timing/severity/associated sxs/prior Treatment) Patient is a 69 y.o. female presenting with chest pain. The history is provided by the patient. No language interpreter was used.  Chest Pain Pain location:  Substernal area Pain quality: burning   Pain radiates to:  Does not radiate Pain radiates to the back: no   Pain severity:  Moderate Onset quality:  Gradual Duration:  2 days Timing:  Constant Progression:  Unchanged Chronicity:  New Context: breathing   Relieved by:  Nothing Worsened by:  Nothing tried Ineffective treatments:  Antacids Associated symptoms: shortness of breath   Associated symptoms: no abdominal pain, no dizziness, no dysphagia, no fatigue, no fever, no nausea, no palpitations, not vomiting and no weakness   Shortness of breath:    Severity:  Moderate   Onset quality:  Sudden   Duration:  2 days   Timing:  Constant   Progression:  Unchanged Risk factors: hypertension, obesity and surgery   Risk factors comment:  Knee replacement 1 month ago   Past Medical History  Diagnosis Date  . 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 Arthritis     Back   . Anemia   . GERD (gastroesophageal reflux disease)   . Colon polyp     Tubular Adenoma   . HOH (hard of hearing)   . Primary localized osteoarthritis of right knee   . History of right bundle branch block (RBBB)   . Cough productive of clear sputum 06/22/2014  . Acute upper respiratory infection 07/06/2014   Past Surgical History  Procedure Laterality Date  . Abdominal hysterectomy  2012  . Total knee arthroplasty  04/29/2011    Procedure: TOTAL KNEE ARTHROPLASTY;  Surgeon: Lorn Junes, MD;  Location: [place];  Service: Orthopedics;  Laterality: Left;  DR [place] THIS  CASE  . Colonoscopy w/ biopsies    . Joint replacement    . Cholecystectomy N/A 03/09/2013    Procedure: LAPAROSCOPIC CHOLECYSTECTOMY;  Surgeon: Gayland Curry, MD;  Location: [place];  Service: General;  Laterality: N/A;  . Total knee arthroplasty Right 07/04/2014    Procedure: TOTAL KNEE ARTHROPLASTY;  Surgeon: Elsie Saas, MD;  Location: [place];  Service: Orthopedics;  Laterality: Right;   Family History  Problem Relation Age of Onset  . Arthritis Mother   . Hypertension Mother   . Alzheimer's disease Father   . Diabetes Sister   . Hypertension Sister   . Hypertension Brother   . Stroke Brother   . Hypertension Other   . Hypertension Brother   . Hypertension Sister   . Hypertension Sister   . Hypertension Sister   . Anesthesia problems Neg Hx   . Hypotension Neg Hx   . Malignant hyperthermia Neg Hx   . Pseudochol deficiency Neg Hx   . Colon cancer Neg Hx    History  Substance Use Topics  . Smoking status: Former Smoker -- 1 years    Quit date: 04/22/1988  . Smokeless tobacco: Never Used  . Alcohol Use: No   OB History    No data available     Review of Systems  Constitutional: Negative for fever, chills and fatigue.  HENT: Negative for trouble swallowing.   Eyes: Negative for visual disturbance.  Respiratory: Positive for shortness of breath.   Cardiovascular: Positive for chest pain. Negative for palpitations.  Gastrointestinal: Negative for nausea, vomiting, abdominal pain and diarrhea.  Genitourinary: Negative for dysuria and difficulty urinating.  Musculoskeletal: Negative for arthralgias and neck pain.  Skin: Negative for color change.  Neurological: Negative for dizziness and weakness.  Psychiatric/Behavioral: Negative for dysphoric mood.      Allergies  Review of patient's allergies indicates no known allergies.  Home Medications   Prior to Admission medications   Medication Sig Start Date End Date Taking? Authorizing Provider  acetaminophen  (TYLENOL) 325 MG tablet Take 2 tablets (650 mg total) by mouth every 6 (six) hours as needed for mild pain (or Fever >/= 101). 07/06/14   Kirstin Shepperson, PA-C  apixaban (ELIQUIS) 2.5 MG TABS tablet Take 1 tablet (2.5 mg total) by mouth every 12 (twelve) hours. 07/06/14   Kirstin Shepperson, PA-C  docusate sodium (COLACE) 100 MG capsule 1 tab 2 times a day while on narcotics.  STOOL SOFTENER 07/06/14   Kirstin Shepperson, PA-C  doxycycline (VIBRA-TABS) 100 MG tablet Take 1 tablet (100 mg total) by mouth every 12 (twelve) hours. 07/06/14   Kirstin Shepperson, PA-C  furosemide (LASIX) 20 MG tablet Take 20 mg by mouth daily as needed for fluid (Does not take if going out and about).    Historical Provider, MD  mupirocin ointment (BACTROBAN) 2 % Place 1 application into the nose 2 (two) times daily. 07/06/14   Kirstin Shepperson, PA-C  oxyCODONE (OXY IR/ROXICODONE) 5 MG immediate release tablet 1-2 tablets every 4-6 hrs as needed for pain 07/06/14   Kirstin Shepperson, PA-C  pantoprazole (PROTONIX) 40 MG tablet Take 40 mg by mouth daily.    Historical Provider, MD  polyethylene glycol (MIRALAX / GLYCOLAX) packet 17 grams in 8 ounces of water twice a day until bowel movements are regular 07/06/14   Kirstin Shepperson, PA-C  predniSONE (STERAPRED UNI-PAK 21 TAB) 5 MG (21) TBPK tablet Take 1 tablet (5 mg total) by mouth daily. 6 day dosepack po 07/20/14   Gregor Hams, MD  triamcinolone cream (KENALOG) 0.1 % Apply 1 application topically 2 (two) times daily. 07/20/14   Gregor Hams, MD   BP 143/70 mmHg  Pulse 89  Temp(Src) 98.1 F (36.7 C) (Oral)  Resp 22  Ht 5\' 4"  (1.626 m)  Wt 244 lb 11.2 oz (110.995 kg)  BMI 41.98 kg/m2  SpO2 99% Physical Exam  Constitutional: She is oriented to person, place, and time. She appears well-developed and well-nourished. No distress.  HENT:  Head: Normocephalic and atraumatic.  Eyes: Conjunctivae and EOM are normal.  Neck: Normal range of motion.  Cardiovascular: Normal  rate and regular rhythm.  Exam reveals no gallop and no friction rub.   No murmur heard. Pulmonary/Chest: Effort normal and breath sounds normal. She has no wheezes. She has no rales. She exhibits no tenderness.  Abdominal: Soft. She exhibits no distension. There is no tenderness. There is no rebound.  Musculoskeletal: Normal range of motion.  Neurological: She is alert and oriented to person, place, and time. Coordination normal.  Speech is goal-oriented. Moves limbs without ataxia.   Skin: Skin is warm and dry.  Psychiatric: She has a normal mood and affect. Her behavior is normal.  Nursing note and vitals reviewed.   ED Course  Procedures (including critical care time) Labs Review Labs Reviewed  BASIC METABOLIC PANEL - Abnormal; Notable for the following:    Potassium 2.9 (*)  All other components within normal limits  CBC - Abnormal; Notable for the following:    Hemoglobin 10.8 (*)    HCT 33.6 (*)    All other components within normal limits  BRAIN NATRIURETIC PEPTIDE  I-STAT TROPOININ, ED    Imaging Review Dg Chest 2 View  08/12/2014   CLINICAL DATA:  Shortness of breath. Indigestion for 2 days. Chest tightness.  EXAM: CHEST  2 VIEW  COMPARISON:  07/06/2014  FINDINGS: The heart size and mediastinal contours are within normal limits. Both lungs are clear. Spondylosis of the thoracic spine. Surgical clips in the right upper quadrant of the abdomen.  IMPRESSION: No active cardiopulmonary disease.   Electronically Signed   By: Nolon Nations M.D.   On: 08/12/2014 23:24     EKG Interpretation   Date/Time:  Friday August 12 2014 22:53:52 EDT Ventricular Rate:  86 PR Interval:  124 QRS Duration: 134 QT Interval:  400 QTC Calculation: 478 R Axis:   -28 Text Interpretation:  Normal sinus rhythm Right bundle branch block  Minimal voltage criteria for LVH, may be normal variant Abnormal ECG No  significant change since last tracing Confirmed by Glynn Octave  [phone])  on 08/12/2014 10:58:28 PM      MDM   Final diagnoses:  Cough  SOB (shortness of breath)  Gastroesophageal reflux disease, esophagitis presence not specified    4:46 AM Delta trop negative. EKG shows no acute changes. Patient's pain relieved with GI cocktail. CT angio chest shows no acute changes or acute PE. Vitals stable and patient afebrile. Patient will be discharged with instructions to follow up with PCP. Patient instructed to return with worsening or concerning symptoms.     Alvina Chou, PA-C 08/13/14 1505  Everlene Balls, MD 08/13/14 1806

## 2014-08-13 NOTE — ED Notes (Signed)
Pt. Left with all belongings and refused wheelchair 

## 2014-08-15 ENCOUNTER — Ambulatory Visit: Payer: Medicare Other | Admitting: Physical Therapy

## 2014-08-15 DIAGNOSIS — M25661 Stiffness of right knee, not elsewhere classified: Secondary | ICD-10-CM

## 2014-08-15 DIAGNOSIS — R269 Unspecified abnormalities of gait and mobility: Secondary | ICD-10-CM | POA: Diagnosis not present

## 2014-08-15 DIAGNOSIS — M25561 Pain in right knee: Secondary | ICD-10-CM | POA: Diagnosis not present

## 2014-08-15 DIAGNOSIS — R29898 Other symptoms and signs involving the musculoskeletal system: Secondary | ICD-10-CM | POA: Diagnosis not present

## 2014-08-15 DIAGNOSIS — R609 Edema, unspecified: Secondary | ICD-10-CM | POA: Diagnosis not present

## 2014-08-15 NOTE — Therapy (Signed)
[place], [place], [address] Phone: [phone]   Fax:  [phone]  Physical Therapy Treatment  Patient Details  Name: NATARSHA HURWITZ MRN: [id number] Date of Birth: 09-06-1945 Referring Provider:  Nolene Ebbs, MD  Encounter Date: 08/15/2014      PT End of Session - 08/15/14 1324    Visit Number 6   Number of Visits 16   Date for PT Re-Evaluation 09/01/14   PT Start Time 0934   PT Stop Time 1017   PT Time Calculation (min) 43 min   Activity Tolerance Patient tolerated treatment well   Behavior During Therapy [facility] for tasks assessed/performed      Past Medical History  Diagnosis Date  . 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 Arthritis     Back   . Anemia   . GERD (gastroesophageal reflux disease)   . Colon polyp     Tubular Adenoma   . HOH (hard of hearing)   . Primary localized osteoarthritis of right knee   . History of right bundle branch block (RBBB)   . Cough productive of clear sputum 06/22/2014  . Acute upper respiratory infection 07/06/2014  . Hypertension     Past Surgical History  Procedure Laterality Date  . Abdominal hysterectomy  2012  . Total knee arthroplasty  04/29/2011    Procedure: TOTAL KNEE ARTHROPLASTY;  Surgeon: Lorn Junes, MD;  Location: [place];  Service: Orthopedics;  Laterality: Left;  DR [place] THIS CASE  . Colonoscopy w/ biopsies    . Joint replacement    . Cholecystectomy N/A 03/09/2013    Procedure: LAPAROSCOPIC CHOLECYSTECTOMY;  Surgeon: Gayland Curry, MD;  Location: [place];  Service: General;  Laterality: N/A;  . Total knee arthroplasty Right 07/04/2014    Procedure: TOTAL KNEE ARTHROPLASTY;  Surgeon: Elsie Saas, MD;  Location: [place];  Service: Orthopedics;  Laterality: Right;    There were no vitals filed for this visit.  Visit Diagnosis:  Decreased ROM of right knee  Pain in right knee  Abnormality of gait      Subjective Assessment -  08/15/14 0946    Subjective Had to go to ER for severe indigestion.  4/10 knee pain   Pain Score 4    Pain Location Knee   Pain Orientation Right;Anterior   Pain Descriptors / Indicators Tightness   Pain Frequency Intermittent   Aggravating Factors  walking   Pain Relieving Factors rest elevation   Multiple Pain Sites No                         [facility] Adult PT Treatment/Exercise - 08/15/14 0952    Knee/Hip Exercises: Stretches   Quad Stretch 10 seconds  10 reps, foot on 8 inch step   Knee/Hip Exercises: Standing   Heel Raises 15 reps   Forward Step Up Right;2 sets;Hand Hold: 1;Step Height: 4";Step Height: 8"   Wall Squat --  9 reps, painful   Knee/Hip Exercises: Seated   Long Arc Quad --  7 LBS 10 reps.  Full knee extension observed   Heel Slides 10 reps  towel slide, green band   Manual Therapy   Manual Therapy Joint mobilization   Manual therapy comments strap with movement  105 AA 100 Active. cannot lye down due to indegestion issues                PT Education -  08/15/14 1326    Education provided Yes   Education Details Things to do and not do in pool   Person(s) Educated Patient   Methods Explanation          PT Short Term Goals - 08/11/14 1020    PT SHORT TERM GOAL #1   Title "Independent with initial HEP   Time 4   Period Weeks   Status Achieved   PT SHORT TERM GOAL #2   Title Pt will reduce from 5/10 to 3/10 with functional acitivities   Baseline Pt at 2/10 at rest   Time 4   Period Weeks   Status On-going   PT SHORT TERM GOAL #3   Title "Demonstrate and verbalize understanding of condition management including RICE, positioning, use of A.D., HEP.    Baseline uses cane when out and going to church   Time 4   Period Weeks   Status Achieved           PT Long Term Goals - 08/11/14 1021    PT LONG TERM GOAL #1   Title "Pt will be independent with advanced HEP.    Time 8   Period Weeks   Status On-going   PT LONG TERM  GOAL #2   Title "Pain will decrease to 1/10 with all functional activities   Time 8   Period Weeks   Status On-going   PT LONG TERM GOAL #3   Title "R knee AAROM flexion will improve to 2- 95 degrees for improved mobility to rise up and down from chair   Time 8   Period Weeks   Status On-going   PT LONG TERM GOAL #4   Title "FOTO will improve from 43% limitation   to  39% limitation   indicating improved functional mobility .    Time 8   Period Weeks   Status On-going   PT LONG TERM GOAL #5   Title Improve functional mobility in order to walk without assistive device at communitiy level 2.73ft/sec   Time 8   Period Weeks   Status On-going               Plan - 08/15/14 1324    Clinical Impression Statement 105 AAROM.  Not feeling well today, low energy.  Had recent ER visit with Severe indegestion.  ROM improving   PT Next Visit Plan Progress steps and closed chain .  Also flexion knee mobilization. try mulligan technique to increase flexion on table   Consulted and Agree with Plan of Care Patient        Problem List Patient Active Problem List   Diagnosis Date Noted  . Acute upper respiratory infection 07/06/2014  . 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 DJD (degenerative joint disease) of knee 07/04/2014  . Primary localized osteoarthritis of right knee   . Arthritis   . Anemia   . GERD (gastroesophageal reflux disease)   . Colon polyp   . HOH (hard of hearing)   . Postoperative anemia due to acute blood loss 05/01/2011  . 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 [place], [place], [address] Phone: [phone]   Fax:  [phone]   Melvenia Needles, PTA 08/15/2014 1:27 PM Phone: [phone] Fax: [phone]

## 2014-08-18 ENCOUNTER — Ambulatory Visit: Payer: Medicare Other | Admitting: Physical Therapy

## 2014-08-18 DIAGNOSIS — R609 Edema, unspecified: Secondary | ICD-10-CM | POA: Diagnosis not present

## 2014-08-18 DIAGNOSIS — R29898 Other symptoms and signs involving the musculoskeletal system: Secondary | ICD-10-CM | POA: Diagnosis not present

## 2014-08-18 DIAGNOSIS — M25661 Stiffness of right knee, not elsewhere classified: Secondary | ICD-10-CM

## 2014-08-18 DIAGNOSIS — M25561 Pain in right knee: Secondary | ICD-10-CM

## 2014-08-18 DIAGNOSIS — R269 Unspecified abnormalities of gait and mobility: Secondary | ICD-10-CM

## 2014-08-18 NOTE — Therapy (Signed)
[place], [place], [address] Phone: [phone]   Fax:  [phone]  Physical Therapy Treatment  Patient Details  Name: [name] MRN: [id number] Date of Birth: February 21, 1945 Referring Provider:  Nolene Ebbs, MD  Encounter Date: 08/18/2014      PT End of Session - 08/18/14 0936    Visit Number 7   Number of Visits 16   Date for PT Re-Evaluation 09/01/14   Authorization Type UHC/Medicare   Authorization Time Period 09-01-14   PT Start Time 0932   PT Stop Time 1030   PT Time Calculation (min) 58 min   Activity Tolerance Patient tolerated treatment well   Behavior During Therapy [facility] for tasks assessed/performed      Past Medical History  Diagnosis Date  . 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 Arthritis     Back   . Anemia   . GERD (gastroesophageal reflux disease)   . Colon polyp     Tubular Adenoma   . HOH (hard of hearing)   . Primary localized osteoarthritis of right knee   . History of right bundle branch block (RBBB)   . Cough productive of clear sputum 06/22/2014  . Acute upper respiratory infection 07/06/2014  . Hypertension     Past Surgical History  Procedure Laterality Date  . Abdominal hysterectomy  2012  . Total knee arthroplasty  04/29/2011    Procedure: TOTAL KNEE ARTHROPLASTY;  Surgeon: Lorn Junes, MD;  Location: [place];  Service: Orthopedics;  Laterality: Left;  DR [place] THIS CASE  . Colonoscopy w/ biopsies    . Joint replacement    . Cholecystectomy N/A 03/09/2013    Procedure: LAPAROSCOPIC CHOLECYSTECTOMY;  Surgeon: Gayland Curry, MD;  Location: [place];  Service: General;  Laterality: N/A;  . Total knee arthroplasty Right 07/04/2014    Procedure: TOTAL KNEE ARTHROPLASTY;  Surgeon: Elsie Saas, MD;  Location: [place];  Service: Orthopedics;  Laterality: Right;    There were no vitals filed for this visit.  Visit Diagnosis:  Decreased ROM of right knee  Pain  in right knee  Abnormality of gait  Edema      Subjective Assessment - 08/18/14 0936    Subjective I have a sharp pain in right knee   Currently in Pain? Yes   Pain Score 3    Pain Location Knee   Pain Orientation Right   Pain Descriptors / Indicators Sore  sharp at times   Pain Type Surgical pain            [employer] PT Assessment - 08/18/14 0941    AROM   Right Knee Extension 3   Right Knee Flexion 104   Left Knee Extension 0   Left Knee Flexion 110   Strength   Right Hip Flexion 5/5   Right Hip ABduction 4+/5   Left Hip Flexion 5/5   Left Hip ABduction 4/5                     [facility] Adult PT Treatment/Exercise - 08/18/14 0941    Ambulation/Gait   Gait velocity 2.84 ft/sec  without a cane   Knee/Hip Exercises: Stretches   Sports administrator 3 reps  with PT assist 15 sec hold   Knee/Hip Exercises: Aerobic   Stationary Bike Nustep L5, minutes 60min   Knee/Hip Exercises: Standing   Heel Raises 15 reps  x2 with decrease heel height  Knee/Hip Exercises: Supine   Other Supine Knee/Hip Exercises supine clams with red t band 2 x 10    Cryotherapy   Number Minutes Cryotherapy 10 Minutes   Cryotherapy Location Knee   Type of Cryotherapy Ice pack   Manual Therapy   Manual Therapy Joint mobilization   Manual therapy comments mulligan with strap to Right knee anterior tibial with distraction to improve flexion    Soft tissue mobilization IASTYM tool to Left quad/ contract relax                 PT Education - 08/18/14 1009    Education provided Yes   Education Details Hip supine clams with red t band and scar tissue/soft tissue and self and given information about decreasing edema in Right ankle with positioning.   Person(s) Educated Patient   Methods Explanation;Demonstration   Comprehension Verbalized understanding;Returned demonstration          PT Short Term Goals - 08/18/14 1012    PT SHORT TERM GOAL #1   Title "Independent with initial HEP    Time 4   Period Weeks   Status Achieved   PT SHORT TERM GOAL #2   Title Pt will reduce from 5/10 to 3/10 with functional acitivities   Baseline 2/10 after exercise   Time 4   Period Weeks   Status Achieved   PT SHORT TERM GOAL #3   Title "Demonstrate and verbalize understanding of condition management including RICE, positioning, use of A.D., HEP.    Baseline uses cane when out and going to church   Time 4   Period Weeks   Status Achieved           PT Long Term Goals - 08/18/14 1013    PT LONG TERM GOAL #1   Title "Pt will be independent with advanced HEP.    Time 8   Period Weeks   Status On-going   PT LONG TERM GOAL #2   Title "Pain will decrease to 1/10 with all functional activities   Baseline on level surfaces it is 1/10 to 2/10   Time 8   Period Weeks   Status On-going   PT LONG TERM GOAL #3   Title "R knee AAROM flexion will improve to 2- 95 degrees for improved mobility to rise up and down from chair   Baseline Pt 0 to 104 on Right knee   Time 8   Period Weeks   Status Achieved   PT LONG TERM GOAL #4   Title "FOTO will improve from 43% limitation   to  39% limitation   indicating improved functional mobility .    Time 8   Period Weeks   Status Unable to assess   PT LONG TERM GOAL #5   Title Improve functional mobility in order to walk without assistive device at communitiy level 2.34ft/sec   Baseline achieved 2.46ft/sec    Time 8   Period Weeks   Status Achieved   Additional Long Term Goals   Additional Long Term Goals Yes   PT LONG TERM GOAL #6   Title Pt to achieve 4+/5 knee Right strength in order to negotiate steps with 75% improved confidence and safety   Time 8   Period Weeks   Status New               Plan - 08/18/14 1105    Clinical Impression Statement Pt able to achieve AROM Right knee flex of 104 after treatment today.  Pt pain level 3/10.  Pt with weak heel raises,Plantarflexion and noted swelling/edema around ankle. Pt  educated on how to prevent swelling by utilizing non dependent postioning.  Pt with decreased confiidence to negotiate steps due to R LE weakness and deecreast core /hip strength. LE strength.     Pt will benefit from skilled therapeutic intervention in order to improve on the following deficits Abnormal gait;Decreased range of motion;Decreased strength;Difficulty walking;Pain   Rehab Potential Good   PT Frequency 2x / week   PT Duration 8 weeks   PT Treatment/Interventions ADLs/Self Care Home Management;Electrical Stimulation;Cryotherapy;Moist Heat;Ultrasound;Gait training;Stair training;Neuromuscular re-education;Therapeutic exercise;Functional mobility training;Patient/family education;Manual techniques;Taping;Dry needling;Passive range of motion;Scar mobilization   PT Next Visit Plan Send progress note to MD and assess goals. concentrate on flexion of R knee and hip strengthening in order to negotiate steps with greater stability and strengthe   PT Home Exercise Plan progress closed chain exercise as tolerated.   Consulted and Agree with Plan of Care Patient        Problem List Patient Active Problem List   Diagnosis Date Noted  . Acute upper respiratory infection 07/06/2014  . 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 DJD (degenerative joint disease) of knee 07/04/2014  . Primary localized osteoarthritis of right knee   . Arthritis   . Anemia   . GERD (gastroesophageal reflux disease)   . Colon polyp   . HOH (hard of hearing)   . Postoperative anemia due to acute blood loss 05/01/2011  . 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

## 2014-08-18 NOTE — Patient Instructions (Signed)
External Rotation: Hip - Knees Apart (Hook-Lying)   Lie with hips and knees bent, band tied just above knees. Pull knees apart. Hold for _5__ seconds. Rest for _3__ seconds. Repeat __10 x 3_ times. Do _1-2__ times a day.  Copyright  VHI. All rights reserved.  Voncille Lo, PT 08/18/2014 10:09 AM Phone: [phone] Fax: [phone]

## 2014-08-26 ENCOUNTER — Encounter (HOSPITAL_COMMUNITY): Payer: Self-pay | Admitting: Orthopedic Surgery

## 2014-08-30 DIAGNOSIS — Z96651 Presence of right artificial knee joint: Secondary | ICD-10-CM | POA: Diagnosis not present

## 2014-09-06 ENCOUNTER — Ambulatory Visit: Payer: Medicare Other | Attending: Orthopedic Surgery | Admitting: Physical Therapy

## 2014-09-06 DIAGNOSIS — M25561 Pain in right knee: Secondary | ICD-10-CM | POA: Insufficient documentation

## 2014-09-06 DIAGNOSIS — R609 Edema, unspecified: Secondary | ICD-10-CM | POA: Diagnosis not present

## 2014-09-06 DIAGNOSIS — R29898 Other symptoms and signs involving the musculoskeletal system: Secondary | ICD-10-CM | POA: Diagnosis not present

## 2014-09-06 DIAGNOSIS — M25661 Stiffness of right knee, not elsewhere classified: Secondary | ICD-10-CM

## 2014-09-06 DIAGNOSIS — R269 Unspecified abnormalities of gait and mobility: Secondary | ICD-10-CM | POA: Diagnosis not present

## 2014-09-06 NOTE — Therapy (Signed)
[place], [place], [address] Phone: [phone]   Fax:  [phone]  Physical Therapy Treatment  Patient Details  Name: [name] MRN: [id number] Date of Birth: 12/21/1945 Referring Provider:  Nolene Ebbs, MD  Encounter Date: 09/06/2014      PT End of Session - 09/06/14 1810    Visit Number 8   Date for PT Re-Evaluation 09/01/14   PT Start Time 0932   PT Stop Time 1633   PT Time Calculation (min) 43 min   Activity Tolerance Patient tolerated treatment well   Behavior During Therapy [facility] for tasks assessed/performed      Past Medical History  Diagnosis Date  . 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 Arthritis     Back   . Anemia   . GERD (gastroesophageal reflux disease)   . Colon polyp     Tubular Adenoma   . HOH (hard of hearing)   . Primary localized osteoarthritis of right knee   . History of right bundle branch block (RBBB)   . Cough productive of clear sputum 06/22/2014  . Acute upper respiratory infection 07/06/2014  . Hypertension     Past Surgical History  Procedure Laterality Date  . Abdominal hysterectomy  2012  . Total knee arthroplasty  04/29/2011    Procedure: TOTAL KNEE ARTHROPLASTY;  Surgeon: Lorn Junes, MD;  Location: [place];  Service: Orthopedics;  Laterality: Left;  DR [place] THIS CASE  . Colonoscopy w/ biopsies    . Joint replacement    . Cholecystectomy N/A 03/09/2013    Procedure: LAPAROSCOPIC CHOLECYSTECTOMY;  Surgeon: Gayland Curry, MD;  Location: [place];  Service: General;  Laterality: N/A;  . Total knee arthroplasty Right 07/04/2014    Procedure: TOTAL KNEE ARTHROPLASTY;  Surgeon: Elsie Saas, MD;  Location: [place];  Service: Orthopedics;  Laterality: Right;    There were no vitals filed for this visit.  Visit Diagnosis:  Decreased ROM of right knee  Abnormality of gait  Edema      Subjective Assessment - 09/06/14 1800    Subjective I just  don't feel good and I don't have my appetite back.  I exercise and only use my cane for longer walks.     Currently in Pain? Yes   Pain Score 5    Pain Location Knee   Pain Orientation Right   Pain Descriptors / Indicators Sore  stiff   Aggravating Factors  walking, end range stretches   Pain Relieving Factors rest elevation, uses ice at home   Multiple Pain Sites No                         [facility] Adult PT Treatment/Exercise - 09/06/14 1555    Ambulation/Gait   Stairs Yes   Gait Comments can step over step up/down 6 inches with 2 rails 3 sets of 4.Also tried 4 inch steps, too easy.  uneven mat in gym not a challange today.   Knee/Hip Exercises: Stretches   Sports administrator 10 seconds  10 reps prone with strap. monitored leg skin ankle   Knee/Hip Exercises: Standing   Lateral Step Up Right;1 set;10 reps  6 inches, also lowering   Forward Step Up Right;1 set;Hand Hold: 2;Step Height: 8"   Step Down Right;1 set;Hand Hold: 2;Step Height: 4"   Other Standing Knee Exercises Single leg rt slide pillow case with Lt 10 reps 3 directions  close SBA , no hands.   Manual Therapy   Manual Therapy --  retrograde, AARange                  PT Short Term Goals - 08/18/14 1012    PT SHORT TERM GOAL #1   Title "Independent with initial HEP   Time 4   Period Weeks   Status Achieved   PT SHORT TERM GOAL #2   Title Pt will reduce from 5/10 to 3/10 with functional acitivities   Baseline 2/10 after exercise   Time 4   Period Weeks   Status Achieved   PT SHORT TERM GOAL #3   Title "Demonstrate and verbalize understanding of condition management including RICE, positioning, use of A.D., HEP.    Baseline uses cane when out and going to church   Time 4   Period Weeks   Status Achieved           PT Long Term Goals - 09/06/14 1811    PT LONG TERM GOAL #1   Title "Pt will be independent with advanced HEP.    Time 8   Period Weeks   Status On-going   PT LONG TERM  GOAL #2   Title "Pain will decrease to 1/10 with all functional activities   Baseline 0 to 4/10   Time 8   Period Weeks   Status On-going   PT LONG TERM GOAL #3   Title "R knee AAROM flexion will improve to 2- 95 degrees for improved mobility to rise up and down from chair   Baseline Pt 0 to 104 on Right knee   Time 8   Period Weeks   Status Achieved   PT LONG TERM GOAL #4   Title "FOTO will improve from 43% limitation   to  39% limitation   indicating improved functional mobility .    Time 8   Period Weeks   Status Unable to assess   PT LONG TERM GOAL #5   Title Improve functional mobility in order to walk without assistive device at communitiy level 2.42f/sec   Baseline achieved 2.877fsec    Time 8   Period Weeks   Status Achieved   PT LONG TERM GOAL #6   Title Pt to achieve 4+/5 knee Right strength in order to negotiate steps with 75% improved confidence and safety   Baseline 4+/5 MMT, confidence not yet 75% improved on stairs.   Time 8   Period Weeks   Status Partially Met               Plan - 09/06/14 1811    Clinical Impression Statement 2/10 post session today,  She is more functional on steps     MMT 4+/5 quad and hamstrings RT   Problem List Patient Active Problem List   Diagnosis Date Noted  . Acute upper respiratory infection 07/06/2014  . 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 DJD (degenerative joint disease) of knee 07/04/2014  . Primary localized osteoarthritis of right knee   . Arthritis   . Anemia   . GERD (gastroesophageal reflux disease)   . Colon polyp   . HOH (hard of hearing)   . Postoperative anemia due to acute blood loss 05/01/2011  . 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

## 2014-09-08 ENCOUNTER — Ambulatory Visit: Payer: Medicare Other | Admitting: Physical Therapy

## 2014-09-08 DIAGNOSIS — R269 Unspecified abnormalities of gait and mobility: Secondary | ICD-10-CM | POA: Diagnosis not present

## 2014-09-08 DIAGNOSIS — M25561 Pain in right knee: Secondary | ICD-10-CM | POA: Diagnosis not present

## 2014-09-08 DIAGNOSIS — R29898 Other symptoms and signs involving the musculoskeletal system: Secondary | ICD-10-CM | POA: Diagnosis not present

## 2014-09-08 DIAGNOSIS — M25661 Stiffness of right knee, not elsewhere classified: Secondary | ICD-10-CM

## 2014-09-08 DIAGNOSIS — R609 Edema, unspecified: Secondary | ICD-10-CM

## 2014-09-08 NOTE — Therapy (Signed)
[place], [place], [address] Phone: [phone]   Fax:  [phone]  Physical Therapy Treatment/Recertification  Patient Details  Name: [name] MRN: [id number] Date of Birth: 1945-09-08 Referring Provider:  Nolene Ebbs, MD  Encounter Date: 09/08/2014      PT End of Session - 09/08/14 0835    Visit Number 9   Number of Visits 20   Date for PT Re-Evaluation 10/13/14   Authorization Type UHC/Medicare  Gcode applied on visit 9    Authorization Time Period 10/13/14   PT Start Time 0800   PT Stop Time 0855   PT Time Calculation (min) 55 min   Activity Tolerance Patient tolerated treatment well      Past Medical History  Diagnosis Date  . 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 Arthritis     Back   . Anemia   . GERD (gastroesophageal reflux disease)   . Colon polyp     Tubular Adenoma   . HOH (hard of hearing)   . Primary localized osteoarthritis of right knee   . History of right bundle branch block (RBBB)   . Cough productive of clear sputum 06/22/2014  . Acute upper respiratory infection 07/06/2014  . Hypertension     Past Surgical History  Procedure Laterality Date  . Abdominal hysterectomy  2012  . Total knee arthroplasty  04/29/2011    Procedure: TOTAL KNEE ARTHROPLASTY;  Surgeon: Lorn Junes, MD;  Location: [place];  Service: Orthopedics;  Laterality: Left;  DR [place] THIS CASE  . Colonoscopy w/ biopsies    . Joint replacement    . Cholecystectomy N/A 03/09/2013    Procedure: LAPAROSCOPIC CHOLECYSTECTOMY;  Surgeon: Gayland Curry, MD;  Location: [place];  Service: General;  Laterality: N/A;  . Total knee arthroplasty Right 07/04/2014    Procedure: TOTAL KNEE ARTHROPLASTY;  Surgeon: Elsie Saas, MD;  Location: [place];  Service: Orthopedics;  Laterality: Right;    There were no vitals filed for this visit.  Visit Diagnosis:  Decreased ROM of right knee - Plan: PT plan of care  cert/re-cert  Abnormality of gait - Plan: PT plan of care cert/re-cert  Edema - Plan: PT plan of care cert/re-cert  Pain in right knee - Plan: PT plan of care cert/re-cert      Subjective Assessment - 09/08/14 0756    Subjective It's just a nagging pain constantly.  Tenderness is less.  No assistive device needed.   Currently in Pain? Yes   Pain Score 4    Pain Location Knee   Pain Orientation Right   Pain Type Surgical pain   Pain Onset More than a month ago   Pain Frequency Constant   Aggravating Factors  walking, end range stretches   Pain Relieving Factors rest, elevation, ice at home            [facility] PT Assessment - 09/08/14 0350    Observation/Other Assessments   Focus on Therapeutic Outcomes (FOTO)  44%   AROM   Right Knee Extension 0   Right Knee Flexion 108   Strength   Right Knee Flexion 4/5   Right Knee Extension 4/5                     [facility] Adult PT Treatment/Exercise - 09/08/14 0806    Knee/Hip Exercises: Aerobic   Stationary Bike Nu-Step L4 10 min   Knee/Hip Exercises: Standing  Lateral Step Up Right;1 set;Hand Hold: 1;Step Height: 6";20 reps   Forward Step Up Right;1 set;Hand Hold: 1;Step Height: 6";20 reps   Step Down Right;10 reps;Hand Hold: 1;Step Height: 4"   SLS with Vectors 10x WB on right   Knee/Hip Exercises: Seated   Long Arc Quad Right;15 reps   Stool Scoot - Round Trips HS pull backs  red band 15x   Other Seated Knee/Hip Exercises sit to stand 15x   Cryotherapy   Number Minutes Cryotherapy 10 Minutes   Cryotherapy Location Knee   Type of Cryotherapy Ice pack   Manual Therapy   Joint Mobilization joint mobs for increasing R knee flex  grade 2/3 as tolerated   Soft tissue mobilization to quads and HS in sitting and supine                  PT Short Term Goals - 09/08/14 0903    PT SHORT TERM GOAL #1   Title "Independent with initial HEP   Status Achieved   PT SHORT TERM GOAL #2   Title Pt will reduce from  5/10 to 3/10 with functional acitivities   Status Achieved   PT SHORT TERM GOAL #3   Title "Demonstrate and verbalize understanding of condition management including RICE, positioning, use of A.D., HEP.    Status Achieved           PT Long Term Goals - 09/08/14 0903    PT LONG TERM GOAL #1   Title "Pt will be independent with advanced HEP.    Time 6   Period Weeks   Status On-going   PT LONG TERM GOAL #2   Title "Pain will decrease to 1/10 with all functional activities   Time 6   Period Weeks   Status On-going   PT LONG TERM GOAL #3   Title "R knee AAROM flexion will improve to 2- 95 degrees for improved mobility to rise up and down from chair   Status Achieved   PT LONG TERM GOAL #4   Title "FOTO will improve from 43% limitation   to  39% limitation   indicating improved functional mobility .    Time 6   Period Weeks   Status On-going   PT LONG TERM GOAL #5   Title Improve functional mobility in order to walk without assistive device at communitiy level 2.52f/sec   Status Achieved   PT LONG TERM GOAL #6   Title Pt to achieve 4+/5 knee Right strength in order to negotiate steps with 75% improved confidence and safety   Time 6   Period Weeks   Status Partially Met               Plan - 09/08/14 04163   Clinical Impression Statement Patient reports a general nagging knee pain but reports her function is improving.  Right knee AROM 0-108 degrees.  Quad and HS strength 4/5.  Able to walk 2 blocks with min difficulty.  Able to ascend steps reciprocally but lacks strength and ROM for ease with descending steps.  The patient would benefit from additional PT for further progression of knee flexion ROM and empasis on quad strengthening.     Pt will benefit from skilled therapeutic intervention in order to improve on the following deficits Abnormal gait;Decreased range of motion;Decreased strength;Difficulty walking;Pain   Rehab Potential Good   PT Frequency 2x / week   PT  Duration 6 weeks   PT Treatment/Interventions ADLs/Self Care Home Management;Electrical  Stimulation;Cryotherapy;Moist Heat;Ultrasound;Gait training;Stair training;Neuromuscular re-education;Therapeutic exercise;Functional mobility training;Patient/family education;Manual techniques;Taping;Dry needling;Passive range of motion;Scar mobilization   PT Next Visit Plan Recertification completed today to continue PT for up to 5 more weeks.  Continue  with emphasis on knee flexion ROM and quad strengthening          G-Codes - Sep 18, 2014 0851    Functional Assessment Tool Used FOTO   Functional Limitation Mobility: Walking and moving around   Mobility: Walking and Moving Around Current Status [phone]) At least 40 percent but less than 60 percent impaired, limited or restricted   Mobility: Walking and Moving Around Goal Status [phone]) At least 20 percent but less than 40 percent impaired, limited or restricted      Problem List Patient Active Problem List   Diagnosis Date Noted  . Acute upper respiratory infection 07/06/2014  . 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 DJD (degenerative joint disease) of knee 07/04/2014  . Primary localized osteoarthritis of right knee   . Arthritis   . Anemia   . GERD (gastroesophageal reflux disease)   . Colon polyp   . HOH (hard of hearing)   . Postoperative anemia due to acute blood loss 05/01/2011  . 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 [address] [place], [place], [address] Phone: [phone]   Fax:  [phone]  Ruben Im, PT September 18, 2014 9:08 AM Phone: [phone] Fax: [phone]

## 2014-09-13 ENCOUNTER — Ambulatory Visit: Payer: Medicare Other | Admitting: Physical Therapy

## 2014-09-13 DIAGNOSIS — M25561 Pain in right knee: Secondary | ICD-10-CM | POA: Diagnosis not present

## 2014-09-13 DIAGNOSIS — R269 Unspecified abnormalities of gait and mobility: Secondary | ICD-10-CM | POA: Diagnosis not present

## 2014-09-13 DIAGNOSIS — M25661 Stiffness of right knee, not elsewhere classified: Secondary | ICD-10-CM

## 2014-09-13 DIAGNOSIS — R609 Edema, unspecified: Secondary | ICD-10-CM | POA: Diagnosis not present

## 2014-09-13 DIAGNOSIS — R29898 Other symptoms and signs involving the musculoskeletal system: Secondary | ICD-10-CM | POA: Diagnosis not present

## 2014-09-13 NOTE — Therapy (Signed)
[place], [place], [address] Phone: [phone]   Fax:  [phone]  Physical Therapy Treatment  Patient Details  Name: [name] MRN: [id number] Date of Birth: Sep 21, 1945 Referring Provider:  Nolene Ebbs, MD  Encounter Date: 09/13/2014      PT End of Session - 09/13/14 1029    Visit Number 10   Number of Visits 20   Date for PT Re-Evaluation 10/13/14   PT Start Time 0930   PT Stop Time 1020   PT Time Calculation (min) 50 min   Activity Tolerance Patient tolerated treatment well;No increased pain   Behavior During Therapy [facility] for tasks assessed/performed      Past Medical History  Diagnosis Date  . 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 Arthritis     Back   . Anemia   . GERD (gastroesophageal reflux disease)   . Colon polyp     Tubular Adenoma   . HOH (hard of hearing)   . Primary localized osteoarthritis of right knee   . History of right bundle branch block (RBBB)   . Cough productive of clear sputum 06/22/2014  . Acute upper respiratory infection 07/06/2014  . Hypertension     Past Surgical History  Procedure Laterality Date  . Abdominal hysterectomy  2012  . Total knee arthroplasty  04/29/2011    Procedure: TOTAL KNEE ARTHROPLASTY;  Surgeon: Lorn Junes, MD;  Location: [place];  Service: Orthopedics;  Laterality: Left;  DR [place] THIS CASE  . Colonoscopy w/ biopsies    . Joint replacement    . Cholecystectomy N/A 03/09/2013    Procedure: LAPAROSCOPIC CHOLECYSTECTOMY;  Surgeon: Gayland Curry, MD;  Location: [place];  Service: General;  Laterality: N/A;  . Total knee arthroplasty Right 07/04/2014    Procedure: TOTAL KNEE ARTHROPLASTY;  Surgeon: Elsie Saas, MD;  Location: [place];  Service: Orthopedics;  Laterality: Right;    There were no vitals filed for this visit.  Visit Diagnosis:  Decreased ROM of right knee  Abnormality of gait      Subjective Assessment -  09/13/14 0938    Subjective No pain.  I need more strength in my  leg and I want to work on bending.   Pain Score 0-No pain                         [facility] Adult PT Treatment/Exercise - 09/13/14 0938    Knee/Hip Exercises: Stretches   Passive Hamstring Stretch 3 reps;30 seconds   Knee/Hip Exercises: Standing   Other Standing Knee Exercises stairs 4 steps X4 step over step using both rails.  extra time required with descending.   Knee/Hip Exercises: Seated   Heel Slides 10 reps   Heel Slides Limitations 105 degrees AROM   Knee/Hip Exercises: Supine   Short Arc Quad Sets 10 reps  , 10 LBS 10 reps   Heel Slides 1 set;10 reps   Heel Slides Limitations AA, painful end range   Straight Leg Raises 10 reps   Other Supine Knee/Hip Exercises Gluteal stretch end range about 90 degrees RT 10 reps PROM                  PT Short Term Goals - 09/08/14 0903    PT SHORT TERM GOAL #1   Title "Independent with initial HEP   Status Achieved   PT SHORT TERM GOAL #2  Title Pt will reduce from 5/10 to 3/10 with functional acitivities   Status Achieved   PT SHORT TERM GOAL #3   Title "Demonstrate and verbalize understanding of condition management including RICE, positioning, use of A.D., HEP.    Status Achieved           PT Long Term Goals - 09/08/14 0903    PT LONG TERM GOAL #1   Title "Pt will be independent with advanced HEP.    Time 6   Period Weeks   Status On-going   PT LONG TERM GOAL #2   Title "Pain will decrease to 1/10 with all functional activities   Time 6   Period Weeks   Status On-going   PT LONG TERM GOAL #3   Title "R knee AAROM flexion will improve to 2- 95 degrees for improved mobility to rise up and down from chair   Status Achieved   PT LONG TERM GOAL #4   Title "FOTO will improve from 43% limitation   to  39% limitation   indicating improved functional mobility .    Time 6   Period Weeks   Status On-going   PT LONG TERM GOAL #5    Title Improve functional mobility in order to walk without assistive device at communitiy level 2.75f/sec   Status Achieved   PT LONG TERM GOAL #6   Title Pt to achieve 4+/5 knee Right strength in order to negotiate steps with 75% improved confidence and safety   Time 6   Period Weeks   Status Partially Met               Plan - 09/13/14 1030    Clinical Impression Statement No pain this am.  strengthening and stretching focus .  No pain at end of session.  No new goals met.   PT Next Visit Plan ROM, strengthening, closed chain, wall sits try clams or brigdes, mini squats.  cable walking?   Consulted and Agree with Plan of Care Patient        Problem List Patient Active Problem List   Diagnosis Date Noted  . Acute upper respiratory infection 07/06/2014  . 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 DJD (degenerative joint disease) of knee 07/04/2014  . Primary localized osteoarthritis of right knee   . Arthritis   . Anemia   . GERD (gastroesophageal reflux disease)   . Colon polyp   . HOH (hard of hearing)   . Postoperative anemia due to acute blood loss 05/01/2011  . 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

## 2014-09-15 ENCOUNTER — Ambulatory Visit: Payer: Medicare Other | Admitting: Physical Therapy

## 2014-09-15 DIAGNOSIS — Z6841 Body Mass Index (BMI) 40.0 and over, adult: Secondary | ICD-10-CM | POA: Diagnosis not present

## 2014-09-15 DIAGNOSIS — J302 Other seasonal allergic rhinitis: Secondary | ICD-10-CM | POA: Diagnosis not present

## 2014-09-15 DIAGNOSIS — R269 Unspecified abnormalities of gait and mobility: Secondary | ICD-10-CM

## 2014-09-15 DIAGNOSIS — M25661 Stiffness of right knee, not elsewhere classified: Secondary | ICD-10-CM

## 2014-09-15 DIAGNOSIS — M12869 Other specific arthropathies, not elsewhere classified, unspecified knee: Secondary | ICD-10-CM | POA: Diagnosis not present

## 2014-09-15 DIAGNOSIS — M25561 Pain in right knee: Secondary | ICD-10-CM

## 2014-09-15 DIAGNOSIS — R609 Edema, unspecified: Secondary | ICD-10-CM | POA: Diagnosis not present

## 2014-09-15 DIAGNOSIS — E669 Obesity, unspecified: Secondary | ICD-10-CM | POA: Diagnosis not present

## 2014-09-15 DIAGNOSIS — R29898 Other symptoms and signs involving the musculoskeletal system: Secondary | ICD-10-CM | POA: Diagnosis not present

## 2014-09-15 DIAGNOSIS — K219 Gastro-esophageal reflux disease without esophagitis: Secondary | ICD-10-CM | POA: Diagnosis not present

## 2014-09-15 NOTE — Therapy (Signed)
[place], [place], [address] Phone: [phone]   Fax:  [phone]  Physical Therapy Treatment  Patient Details  Name: [name] MRN: [id number] Date of Birth: 01-27-1946 Referring Provider:  Nolene Ebbs, MD  Encounter Date: 09/15/2014      PT End of Session - 09/15/14 0911    Visit Number 11   Number of Visits 20   Date for PT Re-Evaluation 10/13/14   Authorization Type UHC/Medicare  Gcode applied on visit 9    Authorization Time Period 10/13/14   PT Start Time 0847   PT Stop Time 0920   PT Time Calculation (min) 33 min   Activity Tolerance Patient tolerated treatment well   Behavior During Therapy [facility] for tasks assessed/performed      Past Medical History  Diagnosis Date  . 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 Arthritis     Back   . Anemia   . GERD (gastroesophageal reflux disease)   . Colon polyp     Tubular Adenoma   . HOH (hard of hearing)   . Primary localized osteoarthritis of right knee   . History of right bundle branch block (RBBB)   . Cough productive of clear sputum 06/22/2014  . Acute upper respiratory infection 07/06/2014  . Hypertension     Past Surgical History  Procedure Laterality Date  . Abdominal hysterectomy  2012  . Total knee arthroplasty  04/29/2011    Procedure: TOTAL KNEE ARTHROPLASTY;  Surgeon: Lorn Junes, MD;  Location: [place];  Service: Orthopedics;  Laterality: Left;  DR [place] THIS CASE  . Colonoscopy w/ biopsies    . Joint replacement    . Cholecystectomy N/A 03/09/2013    Procedure: LAPAROSCOPIC CHOLECYSTECTOMY;  Surgeon: Gayland Curry, MD;  Location: [place];  Service: General;  Laterality: N/A;  . Total knee arthroplasty Right 07/04/2014    Procedure: TOTAL KNEE ARTHROPLASTY;  Surgeon: Elsie Saas, MD;  Location: [place];  Service: Orthopedics;  Laterality: Right;    There were no vitals filed for this visit.  Visit Diagnosis:   Decreased ROM of right knee  Abnormality of gait  Edema  Pain in right knee      Subjective Assessment - 09/15/14 0850    Subjective No pain, just stiff  I need more strength   Limitations Standing;Walking   How long can you sit comfortably? unlimited   How long can you stand comfortably? 1hour   Patient Stated Goals  bend the knee and walk without a cane, household chores   Currently in Pain? No/denies                         [facility] Adult PT Treatment/Exercise - 09/15/14 0851    Knee/Hip Exercises: Stretches   Passive Hamstring Stretch 3 reps;30 seconds   Knee/Hip Exercises: Standing   Lateral Step Up Right;1 set;Hand Hold: 1;Step Height: 6";20 reps   Forward Step Up Right;1 set;Hand Hold: 1;Step Height: 6";20 reps   Step Down Right;10 reps;Hand Hold: 1;Step Height: 4"   Wall Squat 1 set;15 reps   Other Standing Knee Exercises sit to stand 2 x 10 eccentric control   Other Standing Knee Exercises 8 inch step prolonged knee flex stretch in standing for a minute   Knee/Hip Exercises: Seated   Heel Slides 10 reps   Knee/Hip Exercises: Supine   Short Arc Quad Sets 10 reps  , 10  LBS 10 reps   Heel Slides 1 set;10 reps   Heel Slides Limitations stretching, painful end range   Straight Leg Raises 10 reps   Other Supine Knee/Hip Exercises Clams with Red T band 2 x 10   Other Supine Knee/Hip Exercises bridges x 10 in supine with VC                PT Education - 09/15/14 0857    Education provided Yes   Education Details Reviewed previous exercises in supine and standing and sit to stand, prolonged knee flex 60 sec on 8 inch step   Person(s) Educated Patient   Methods Explanation;Demonstration;Verbal cues   Comprehension Verbalized understanding;Returned demonstration          PT Short Term Goals - 09/08/14 0903    PT SHORT TERM GOAL #1   Title "Independent with initial HEP   Status Achieved   PT SHORT TERM GOAL #2   Title Pt will reduce from  5/10 to 3/10 with functional acitivities   Status Achieved   PT SHORT TERM GOAL #3   Title "Demonstrate and verbalize understanding of condition management including RICE, positioning, use of A.D., HEP.    Status Achieved           PT Long Term Goals - 09/08/14 0903    PT LONG TERM GOAL #1   Title "Pt will be independent with advanced HEP.    Time 6   Period Weeks   Status On-going   PT LONG TERM GOAL #2   Title "Pain will decrease to 1/10 with all functional activities   Time 6   Period Weeks   Status On-going   PT LONG TERM GOAL #3   Title "R knee AAROM flexion will improve to 2- 95 degrees for improved mobility to rise up and down from chair   Status Achieved   PT LONG TERM GOAL #4   Title "FOTO will improve from 43% limitation   to  39% limitation   indicating improved functional mobility .    Time 6   Period Weeks   Status On-going   PT LONG TERM GOAL #5   Title Improve functional mobility in order to walk without assistive device at communitiy level 2.47f/sec   Status Achieved   PT LONG TERM GOAL #6   Title Pt to achieve 4+/5 knee Right strength in order to negotiate steps with 75% improved confidence and safety   Time 6   Period Weeks   Status Partially Met               Plan - 09/15/14 01610   Clinical Impression Statement No pain this am. Pt with shortened exercise session due to another MD appt.  No new goals met today.   Pt will benefit from skilled therapeutic intervention in order to improve on the following deficits Abnormal gait;Decreased range of motion;Decreased strength;Difficulty walking;Pain   Rehab Potential Good   PT Frequency 2x / week   PT Duration 6 weeks   PT Treatment/Interventions ADLs/Self Care Home Management;Electrical Stimulation;Cryotherapy;Moist Heat;Ultrasound;Gait training;Stair training;Neuromuscular re-education;Therapeutic exercise;Functional mobility training;Patient/family education;Manual techniques;Taping;Dry  needling;Passive range of motion;Scar mobilization   PT Next Visit Plan ROM, strengthening, closed chain, wall sits try clams or brigdes, mini squats.  cable walking? for next visit . Maximize AROM Right knee check goals   PT Home Exercise Plan progress closed chain exercise as tolerated.   Consulted and Agree with Plan of Care Patient  Problem List Patient Active Problem List   Diagnosis Date Noted  . Acute upper respiratory infection 07/06/2014  . 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 DJD (degenerative joint disease) of knee 07/04/2014  . Primary localized osteoarthritis of right knee   . Arthritis   . Anemia   . GERD (gastroesophageal reflux disease)   . Colon polyp   . HOH (hard of hearing)   . Postoperative anemia due to acute blood loss 05/01/2011  . 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

## 2014-09-19 ENCOUNTER — Encounter: Payer: Medicare Other | Admitting: Physical Therapy

## 2014-09-21 ENCOUNTER — Encounter: Payer: Medicare Other | Admitting: Physical Therapy

## 2014-09-23 ENCOUNTER — Ambulatory Visit: Payer: Medicare Other | Attending: Orthopedic Surgery | Admitting: Physical Therapy

## 2014-09-23 DIAGNOSIS — R269 Unspecified abnormalities of gait and mobility: Secondary | ICD-10-CM | POA: Insufficient documentation

## 2014-09-23 DIAGNOSIS — M25561 Pain in right knee: Secondary | ICD-10-CM | POA: Insufficient documentation

## 2014-09-23 DIAGNOSIS — M25661 Stiffness of right knee, not elsewhere classified: Secondary | ICD-10-CM

## 2014-09-23 DIAGNOSIS — R609 Edema, unspecified: Secondary | ICD-10-CM | POA: Diagnosis not present

## 2014-09-23 DIAGNOSIS — R29898 Other symptoms and signs involving the musculoskeletal system: Secondary | ICD-10-CM | POA: Diagnosis not present

## 2014-09-23 NOTE — Therapy (Signed)
[place], [place], [address] Phone: [phone]   Fax:  [phone]  Physical Therapy Treatment  Patient Details  Name: [name] MRN: [id number] Date of Birth: 06-25-1945 Referring Provider:  Nolene Ebbs, MD  Encounter Date: 09/23/2014      PT End of Session - 09/23/14 0933    Visit Number 12   Number of Visits 20   Date for PT Re-Evaluation 10/13/14   PT Start Time 0803   PT Stop Time 7867   PT Time Calculation (min) 54 min   Activity Tolerance Patient tolerated treatment well;No increased pain   Behavior During Therapy [facility] for tasks assessed/performed      Past Medical History  Diagnosis Date  . 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 Arthritis     Back   . Anemia   . GERD (gastroesophageal reflux disease)   . Colon polyp     Tubular Adenoma   . HOH (hard of hearing)   . Primary localized osteoarthritis of right knee   . History of right bundle branch block (RBBB)   . Cough productive of clear sputum 06/22/2014  . Acute upper respiratory infection 07/06/2014  . Hypertension     Past Surgical History  Procedure Laterality Date  . Abdominal hysterectomy  2012  . Total knee arthroplasty  04/29/2011    Procedure: TOTAL KNEE ARTHROPLASTY;  Surgeon: Lorn Junes, MD;  Location: [place];  Service: Orthopedics;  Laterality: Left;  DR [place] THIS CASE  . Colonoscopy w/ biopsies    . Joint replacement    . Cholecystectomy N/A 03/09/2013    Procedure: LAPAROSCOPIC CHOLECYSTECTOMY;  Surgeon: Gayland Curry, MD;  Location: [place];  Service: General;  Laterality: N/A;  . Total knee arthroplasty Right 07/04/2014    Procedure: TOTAL KNEE ARTHROPLASTY;  Surgeon: Elsie Saas, MD;  Location: [place];  Service: Orthopedics;  Laterality: Right;    There were no vitals filed for this visit.  Visit Diagnosis:  Decreased ROM of right knee      Subjective Assessment - 09/23/14 0809    Subjective  Stiff, sore.  Keeps moving to avoid stiffness.   Currently in Pain? Yes   Aggravating Factors  sitting too long   Pain Relieving Factors moving around   Multiple Pain Sites No                         [facility] Adult PT Treatment/Exercise - 09/23/14 0813    Knee/Hip Exercises: Aerobic   Stationary Bike Nu step 7 minutes L6   Knee/Hip Exercises: Machines for Strengthening   Cybex Knee Flexion 2 plates 20 reps   Knee/Hip Exercises: Standing   Heel Raises 20 reps   Forward Step Up Right;2 sets;20 reps;Hand Hold: 1;Step Height: 6"   Wall Squat 10 reps   Knee/Hip Exercises: Supine   Heel Slides 10 reps;1 set   Heel Slides Limitations 105 AA flexion   Knee Extension Limitations 4+/5 Rt MMT   Knee Flexion Limitations 4/5 mmt RT   Other Supine Knee/Hip Exercises clams with green band 15 reps   Cryotherapy   Number Minutes Cryotherapy 10 Minutes   Cryotherapy Location Knee   Type of Cryotherapy --  cold pack                  PT Short Term Goals - 09/08/14 0903    PT SHORT TERM GOAL #1  Title "Independent with initial HEP   Status Achieved   PT SHORT TERM GOAL #2   Title Pt will reduce from 5/10 to 3/10 with functional acitivities   Status Achieved   PT SHORT TERM GOAL #3   Title "Demonstrate and verbalize understanding of condition management including RICE, positioning, use of A.D., HEP.    Status Achieved           PT Long Term Goals - 09/23/14 0935    PT LONG TERM GOAL #1   Title "Pt will be independent with advanced HEP.    Time 6   Period Weeks   Status On-going   PT LONG TERM GOAL #2   Title "Pain will decrease to 1/10 with all functional activities   Baseline 1/10   Time 6   Period Weeks   Status Achieved   PT LONG TERM GOAL #3   Title "R knee AAROM flexion will improve to 2- 95 degrees for improved mobility to rise up and down from chair   Baseline Pt 0 to 104 on Right knee   Time 8   Period Weeks   Status Achieved   PT LONG TERM  GOAL #4   Title "FOTO will improve from 43% limitation   to  39% limitation   indicating improved functional mobility .    Time 6   Period Weeks   Status Unable to assess   PT LONG TERM GOAL #5   Title Improve functional mobility in order to walk without assistive device at communitiy level 2.41f/sec   Baseline achieved 2.813fsec    Time 8   Period Weeks   Status Achieved   PT LONG TERM GOAL #6   Title Pt to achieve 4+/5 knee Right strength in order to negotiate steps with 75% improved confidence and safety   Baseline 4+/5 MMT, confidence not yet 75% improved on stairs.   Time 6   Period Weeks   Status Partially Met               Plan - 09/23/14 0937    Clinical Impression Statement LTG #2 met   PT Next Visit Plan closed chain, continue hip strengthening. stairs   Consulted and Agree with Plan of Care Patient        Problem List Patient Active Problem List   Diagnosis Date Noted  . Acute upper respiratory infection 07/06/2014  . 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 DJD (degenerative joint disease) of knee 07/04/2014  . Primary localized osteoarthritis of right knee   . Arthritis   . Anemia   . GERD (gastroesophageal reflux disease)   . Colon polyp   . HOH (hard of hearing)   . Postoperative anemia due to acute blood loss 05/01/2011  . 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

## 2014-09-26 ENCOUNTER — Ambulatory Visit: Payer: Medicare Other | Admitting: Physical Therapy

## 2014-09-26 DIAGNOSIS — R269 Unspecified abnormalities of gait and mobility: Secondary | ICD-10-CM

## 2014-09-26 DIAGNOSIS — R609 Edema, unspecified: Secondary | ICD-10-CM | POA: Diagnosis not present

## 2014-09-26 DIAGNOSIS — M25561 Pain in right knee: Secondary | ICD-10-CM | POA: Diagnosis not present

## 2014-09-26 DIAGNOSIS — R29898 Other symptoms and signs involving the musculoskeletal system: Secondary | ICD-10-CM | POA: Diagnosis not present

## 2014-09-26 DIAGNOSIS — M25661 Stiffness of right knee, not elsewhere classified: Secondary | ICD-10-CM

## 2014-09-26 NOTE — Patient Instructions (Signed)
Don compression hose in am when she first gets up.  Vs later in the day when swelling increases.

## 2014-09-26 NOTE — Therapy (Signed)
[place], [place], [address] Phone: [phone]   Fax:  [phone]  Physical Therapy Treatment  Patient Details  Name: [name] MRN: [id number] Date of Birth: [birth year] Referring Provider:  Nolene Ebbs, MD  Encounter Date: 09/26/2014      PT End of Session - 09/26/14 1814    Visit Number 13   Number of Visits 20   Date for PT Re-Evaluation 10/13/14   PT Start Time 0846   PT Stop Time 0946   PT Time Calculation (min) 60 min   Activity Tolerance Patient tolerated treatment well   Behavior During Therapy [facility] for tasks assessed/performed      Past Medical History  Diagnosis Date  . 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 Arthritis     Back   . Anemia   . GERD (gastroesophageal reflux disease)   . Colon polyp     Tubular Adenoma   . HOH (hard of hearing)   . Primary localized osteoarthritis of right knee   . History of right bundle branch block (RBBB)   . Cough productive of clear sputum 06/22/2014  . Acute upper respiratory infection 07/06/2014  . Hypertension     Past Surgical History  Procedure Laterality Date  . Abdominal hysterectomy  2012  . Total knee arthroplasty  04/29/2011    Procedure: TOTAL KNEE ARTHROPLASTY;  Surgeon: Lorn Junes, MD;  Location: [place];  Service: Orthopedics;  Laterality: Left;  DR [place] THIS CASE  . Colonoscopy w/ biopsies    . Joint replacement    . Cholecystectomy N/A 03/09/2013    Procedure: LAPAROSCOPIC CHOLECYSTECTOMY;  Surgeon: Gayland Curry, MD;  Location: [place];  Service: General;  Laterality: N/A;  . Total knee arthroplasty Right 07/04/2014    Procedure: TOTAL KNEE ARTHROPLASTY;  Surgeon: Elsie Saas, MD;  Location: [place];  Service: Orthopedics;  Laterality: Right;    There were no vitals filed for this visit.  Visit Diagnosis:  Edema  Decreased ROM of right knee  Abnormality of gait      Subjective Assessment - 09/26/14 0849     Subjective Able to travel a little this weekend and she did.  No pain this morning, [place] . No cane needed.   Currently in Pain? No/denies                         [facility] Adult PT Treatment/Exercise - 09/26/14 0858    Ambulation/Gait   Stairs Yes   Number of Stairs 16   Height of Stairs 6   Gait Comments practiced step over step with one rail, 6 and 4 inch steps.  stretching repotred and was physically challanging.  she can do better with 2 rails.   Knee/Hip Exercises: Stretches   Quad Stretch 3 reps;30 seconds  over edge of table with patellar glides.   Knee/Hip Exercises: Aerobic   Stationary Bike Nustep, 8 minutes, L6   Knee/Hip Exercises: Standing   Lateral Step Up Right;1 set   Lateral Step Up Limitations cues toe positions   Forward Step Up Right;2 sets;10 reps;Hand Hold: 1;Step Height: 8"   Step Down Right;1 set;5 reps;Hand Hold: 2;Step Height: 4"   Other Standing Knee Exercises 5 reps it to stand   Knee/Hip Exercises: Seated   Long Arc Quad Limitations MMT 5/5 quads   Heel Slides 10 reps  with scar  mobilization and overpressure   Heel Slides Limitations Hamstring MMT 5/5   Knee/Hip Exercises: Supine   Heel Slides 10 reps  with overpressure   Heel Slides Limitations 105 degrees AA   Patellar Mobs --  during quad stretch   Cryotherapy   Number Minutes Cryotherapy 10 Minutes   Cryotherapy Location Knee   Type of Cryotherapy --  cold pack                  PT Short Term Goals - 09/08/14 0903    PT SHORT TERM GOAL #1   Title "Independent with initial HEP   Status Achieved   PT SHORT TERM GOAL #2   Title Pt will reduce from 5/10 to 3/10 with functional acitivities   Status Achieved   PT SHORT TERM GOAL #3   Title "Demonstrate and verbalize understanding of condition management including RICE, positioning, use of A.D., HEP.    Status Achieved           PT Long Term Goals - 09/26/14 1817    PT LONG  TERM GOAL #1   Title "Pt will be independent with advanced HEP.    Baseline independent with exercises sofar   Time 6   Period Weeks   Status On-going   PT LONG TERM GOAL #2   Title "Pain will decrease to 1/10 with all functional activities   Baseline 1/10   Time 6   Period Weeks   Status Achieved   PT LONG TERM GOAL #3   Title "R knee AAROM flexion will improve to 2- 95 degrees for improved mobility to rise up and down from chair   Baseline 0 to 105   Time 8   Status Achieved   PT LONG TERM GOAL #4   Title "FOTO will improve from 43% limitation   to  39% limitation   indicating improved functional mobility .    Time 6   Period Weeks   Status Unable to assess   PT LONG TERM GOAL #5   Title Improve functional mobility in order to walk without assistive device at communitiy level 2.67f/sec   Baseline achieved 2.859fsec    Time 8   Period Weeks   Status Achieved   PT LONG TERM GOAL #6   Title Pt to achieve 4+/5 knee Right strength in order to negotiate steps with 75% improved confidence and safety   Baseline 5/5 MMT, confidence not yet 75% improved   Time 6   Period Weeks   Status Partially Met               Plan - 09/26/14 1815    Clinical Impression Statement ROM continues to be 105 , she is hesitant to descend steps at home, to assist ith this I have asked her to use her cane on steps so she can avoid going down steps sideways at home.     PT Next Visit Plan closed chain, continue hip strengthening. stairs, See what MD says.   Consulted and Agree with Plan of Care Patient        Problem List Patient Active Problem List   Diagnosis Date Noted  . Acute upper respiratory infection 07/06/2014  . 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 DJD (degenerative joint disease) of knee 07/04/2014  . Primary localized osteoarthritis of right knee   . Arthritis   . Anemia   . GERD (gastroesophageal reflux disease)   . Colon polyp   .  HOH (  hard of hearing)   . Postoperative anemia due to acute blood loss 05/01/2011  . 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 [place], [place], [address] Phone: [phone]   Fax:  [phone]     Melvenia Needles, PTA 09/26/2014 6:20 PM Phone: [phone] Fax: [phone]

## 2014-09-27 DIAGNOSIS — Z96651 Presence of right artificial knee joint: Secondary | ICD-10-CM | POA: Diagnosis not present

## 2014-09-28 ENCOUNTER — Ambulatory Visit: Payer: Medicare Other | Admitting: Physical Therapy

## 2014-09-28 DIAGNOSIS — R269 Unspecified abnormalities of gait and mobility: Secondary | ICD-10-CM | POA: Diagnosis not present

## 2014-09-28 DIAGNOSIS — M25561 Pain in right knee: Secondary | ICD-10-CM

## 2014-09-28 DIAGNOSIS — R609 Edema, unspecified: Secondary | ICD-10-CM | POA: Diagnosis not present

## 2014-09-28 DIAGNOSIS — M25661 Stiffness of right knee, not elsewhere classified: Secondary | ICD-10-CM

## 2014-09-28 DIAGNOSIS — R29898 Other symptoms and signs involving the musculoskeletal system: Secondary | ICD-10-CM | POA: Diagnosis not present

## 2014-09-28 NOTE — Therapy (Signed)
[place], [place], [address] Phone: [phone]   Fax:  [phone]  Physical Therapy Treatment  Patient Details  Name: [name] MRN: [id number] Date of Birth: [birth year] Referring Provider:  Nolene Ebbs, MD  Encounter Date: 09/28/2014      PT End of Session - 09/28/14 1212    Visit Number 14   Number of Visits 20   Date for PT Re-Evaluation 10/13/14   PT Start Time 0845   PT Stop Time 0945   PT Time Calculation (min) 60 min   Activity Tolerance Patient tolerated treatment well   Behavior During Therapy [facility] for tasks assessed/performed      Past Medical History  Diagnosis Date  . 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 Arthritis     Back   . Anemia   . GERD (gastroesophageal reflux disease)   . Colon polyp     Tubular Adenoma   . HOH (hard of hearing)   . Primary localized osteoarthritis of right knee   . History of right bundle branch block (RBBB)   . Cough productive of clear sputum 06/22/2014  . Acute upper respiratory infection 07/06/2014  . Hypertension     Past Surgical History  Procedure Laterality Date  . Abdominal hysterectomy  2012  . Total knee arthroplasty  04/29/2011    Procedure: TOTAL KNEE ARTHROPLASTY;  Surgeon: Lorn Junes, MD;  Location: [place];  Service: Orthopedics;  Laterality: Left;  DR [place] THIS CASE  . Colonoscopy w/ biopsies    . Joint replacement    . Cholecystectomy N/A 03/09/2013    Procedure: LAPAROSCOPIC CHOLECYSTECTOMY;  Surgeon: Gayland Curry, MD;  Location: [place];  Service: General;  Laterality: N/A;  . Total knee arthroplasty Right 07/04/2014    Procedure: TOTAL KNEE ARTHROPLASTY;  Surgeon: Elsie Saas, MD;  Location: [place];  Service: Orthopedics;  Laterality: Right;    There were no vitals filed for this visit.  Visit Diagnosis:  Edema  Decreased ROM of right knee  Abnormality of gait  Pain in right knee      Subjective  Assessment - 09/28/14 0856    Subjective Saw MD he wants her to continue 2 more weeks.  Going to start new medicins to  help with pain and swelling.  She has not started yet. Had shooting pain yesterday.  8/10.  No sure what triggered.  Rest helps   She has tries steps with cane and can descend without turning sideways.                         [facility] Adult PT Treatment/Exercise - 09/28/14 0853    Knee/Hip Exercises: Stretches   Quad Stretch --  AAROM stretches into flexion   Knee/Hip Exercises: Aerobic   Stationary Bike Nustep  L7 8 minutes.   Knee/Hip Exercises: Machines for Strengthening   Cybex Knee Flexion 3 plates   Knee/Hip Exercises: Standing   Step Down Limitations Step down  6 inches.   Stairs 16 steps, step over step with use of 1 rail.   SLS --  4 seconds   SLS with Vectors Plyotoss contact guard 1 in a row   Knee/Hip Exercises: Seated   Long Arc Quad 10 reps  5 LBS, with eccentric emphasis   Knee/Hip Exercises: Supine   Heel Slides 10 reps  AA   Cryotherapy   Number Minutes Cryotherapy 10 Minutes  Cryotherapy Location Knee   Type of Cryotherapy --  cold pack                  PT Short Term Goals - 09/08/14 0903    PT SHORT TERM GOAL #1   Title "Independent with initial HEP   Status Achieved   PT SHORT TERM GOAL #2   Title Pt will reduce from 5/10 to 3/10 with functional acitivities   Status Achieved   PT SHORT TERM GOAL #3   Title "Demonstrate and verbalize understanding of condition management including RICE, positioning, use of A.D., HEP.    Status Achieved           PT Long Term Goals - 09/26/14 1817    PT LONG TERM GOAL #1   Title "Pt will be independent with advanced HEP.    Baseline independent with exercises sofar   Time 6   Period Weeks   Status On-going   PT LONG TERM GOAL #2   Title "Pain will decrease to 1/10 with all functional activities   Baseline 1/10   Time 6   Period Weeks   Status Achieved   PT LONG  TERM GOAL #3   Title "R knee AAROM flexion will improve to 2- 95 degrees for improved mobility to rise up and down from chair   Baseline 0 to 105   Time 8   Status Achieved   PT LONG TERM GOAL #4   Title "FOTO will improve from 43% limitation   to  39% limitation   indicating improved functional mobility .    Time 6   Period Weeks   Status Unable to assess   PT LONG TERM GOAL #5   Title Improve functional mobility in order to walk without assistive device at communitiy level 2.53f/sec   Baseline achieved 2.828fsec    Time 8   Period Weeks   Status Achieved   PT LONG TERM GOAL #6   Title Pt to achieve 4+/5 knee Right strength in order to negotiate steps with 75% improved confidence and safety   Baseline 5/5 MMT, confidence not yet 75% improved   Time 6   Period Weeks   Status Partially Met               Plan - 09/28/14 1212    Clinical Impression Statement No pain increase with exercise.  Initiated plyotoss today which was a challange.  Steps easier at home with use of cane, she does not have to turn to the side to descend.   PT Next Visit Plan closed chain, continue hip strengthening. stairs,   Hip machine for abduction?   PT Home Exercise Plan use cane to assist descending steps,  step down, lowering ex.        Problem List Patient Active Problem List   Diagnosis Date Noted  . Acute upper respiratory infection 07/06/2014  . 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 DJD (degenerative joint disease) of knee 07/04/2014  . Primary localized osteoarthritis of right knee   . Arthritis   . Anemia   . GERD (gastroesophageal reflux disease)   . Colon polyp   . HOH (hard of hearing)   . Postoperative anemia due to acute blood loss 05/01/2011  . 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

## 2014-10-11 ENCOUNTER — Ambulatory Visit: Payer: Medicare Other | Admitting: Physical Therapy

## 2014-10-11 DIAGNOSIS — R609 Edema, unspecified: Secondary | ICD-10-CM | POA: Diagnosis not present

## 2014-10-11 DIAGNOSIS — R29898 Other symptoms and signs involving the musculoskeletal system: Secondary | ICD-10-CM | POA: Diagnosis not present

## 2014-10-11 DIAGNOSIS — M25561 Pain in right knee: Secondary | ICD-10-CM | POA: Diagnosis not present

## 2014-10-11 DIAGNOSIS — R269 Unspecified abnormalities of gait and mobility: Secondary | ICD-10-CM | POA: Diagnosis not present

## 2014-10-11 DIAGNOSIS — M25661 Stiffness of right knee, not elsewhere classified: Secondary | ICD-10-CM

## 2014-10-11 NOTE — Therapy (Signed)
[place], [place], [address] Phone: [phone]   Fax:  [phone]  Physical Therapy Treatment  Patient Details  Name: [name] MRN: [id number] Date of Birth: June 16, 1945 Referring Provider:  Nolene Ebbs, MD  Encounter Date: 10/11/2014      PT End of Session - 10/11/14 1312    Visit Number 15   Number of Visits 20   Date for PT Re-Evaluation 10/13/14   PT Start Time 1173   PT Stop Time 0932   PT Time Calculation (min) 45 min   Activity Tolerance Patient tolerated treatment well;No increased pain   Behavior During Therapy [facility] for tasks assessed/performed      Past Medical History  Diagnosis Date  . 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 Arthritis     Back   . Anemia   . GERD (gastroesophageal reflux disease)   . Colon polyp     Tubular Adenoma   . HOH (hard of hearing)   . Primary localized osteoarthritis of right knee   . History of right bundle branch block (RBBB)   . Cough productive of clear sputum 06/22/2014  . Acute upper respiratory infection 07/06/2014  . Hypertension     Past Surgical History  Procedure Laterality Date  . Abdominal hysterectomy  2012  . Total knee arthroplasty  04/29/2011    Procedure: TOTAL KNEE ARTHROPLASTY;  Surgeon: Lorn Junes, MD;  Location: [place];  Service: Orthopedics;  Laterality: Left;  DR [place] THIS CASE  . Colonoscopy w/ biopsies    . Joint replacement    . Cholecystectomy N/A 03/09/2013    Procedure: LAPAROSCOPIC CHOLECYSTECTOMY;  Surgeon: Gayland Curry, MD;  Location: [place];  Service: General;  Laterality: N/A;  . Total knee arthroplasty Right 07/04/2014    Procedure: TOTAL KNEE ARTHROPLASTY;  Surgeon: Elsie Saas, MD;  Location: [place];  Service: Orthopedics;  Laterality: Right;    There were no vitals filed for this visit.  Visit Diagnosis:  Decreased ROM of right knee  Abnormality of gait      Subjective Assessment -  10/11/14 0853    Subjective She has been taking new medication and now has not had pain sices Saturday.   Currently in Pain? No/denies   Pain Descriptors / Indicators --  stiff   Aggravating Factors  sitting too   Pain Relieving Factors getting up every so often and moving around.   Multiple Pain Sites No                         [facility] Adult PT Treatment/Exercise - 10/11/14 0847    Knee/Hip Exercises: Standing   Knee Flexion 10 reps  3 LBS , 10 second holds.   Lateral Step Up Limitations 10 reps   Step Down 2 sets;10 reps   Step Down Limitations small motions   Wall Squat 10 reps  5 X with 3 LB [place]   Other Standing Knee Exercises Isometric wall Hip abduction 10 reps 5 seconds, both legs knee slightly flexed.   Manual Therapy   Soft tissue mobilization Rock tool to scar, patellar mobe. AA stretches into flexion.     Nustep, Level 6 , 6 minutes             PT Short Term Goals - 09/08/14 0903    PT SHORT TERM GOAL #1   Title "Independent with initial HEP   Status Achieved  PT SHORT TERM GOAL #2   Title Pt will reduce from 5/10 to 3/10 with functional acitivities   Status Achieved   PT SHORT TERM GOAL #3   Title "Demonstrate and verbalize understanding of condition management including RICE, positioning, use of A.D., HEP.    Status Achieved           PT Long Term Goals - 10/11/14 1314    PT LONG TERM GOAL #1   Title "Pt will be independent with advanced HEP.    Baseline independent with exercises so far.   PT LONG TERM GOAL #2   Title "Pain will decrease to 1/10 with all functional activities   Status Achieved   PT LONG TERM GOAL #3   Title "R knee AAROM flexion will improve to 2- 95 degrees for improved mobility to rise up and down from chair   Baseline 108   Time 8   Period Weeks   Status Achieved   PT LONG TERM GOAL #4   Time 6   Period Weeks   Status Unable to assess   PT LONG TERM GOAL #5   Title Improve functional mobility  in order to walk without assistive device at communitiy level 2.59f/sec   Status Achieved   PT LONG TERM GOAL #6   Title Pt to achieve 4+/5 knee Right strength in order to negotiate steps with 75% improved confidence and safety   Time 6   Period Weeks   Status Partially Met               Plan - 10/11/14 1313    Clinical Impression Statement No pain today at end of session.  Pain much improved with her new medication.  108 degrees AA Knee flexion.  Progress toward ROM goal.   PT Next Visit Plan closed chain, continue hip strengthening. stairs,   Hip machine for abduction?  Stretching for flexion.   Consulted and Agree with Plan of Care Patient        Problem List Patient Active Problem List   Diagnosis Date Noted  . Acute upper respiratory infection 07/06/2014  . 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 DJD (degenerative joint disease) of knee 07/04/2014  . Primary localized osteoarthritis of right knee   . Arthritis   . Anemia   . GERD (gastroesophageal reflux disease)   . Colon polyp   . HOH (hard of hearing)   . Postoperative anemia due to acute blood loss 05/01/2011  . 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

## 2014-10-13 ENCOUNTER — Ambulatory Visit: Payer: Medicare Other | Admitting: Physical Therapy

## 2014-10-13 DIAGNOSIS — M25561 Pain in right knee: Secondary | ICD-10-CM

## 2014-10-13 DIAGNOSIS — R269 Unspecified abnormalities of gait and mobility: Secondary | ICD-10-CM

## 2014-10-13 DIAGNOSIS — M25661 Stiffness of right knee, not elsewhere classified: Secondary | ICD-10-CM

## 2014-10-13 DIAGNOSIS — R29898 Other symptoms and signs involving the musculoskeletal system: Secondary | ICD-10-CM | POA: Diagnosis not present

## 2014-10-13 DIAGNOSIS — R609 Edema, unspecified: Secondary | ICD-10-CM | POA: Diagnosis not present

## 2014-10-13 NOTE — Therapy (Signed)
[place], [place], [address] Phone: [phone]   Fax:  [phone]  Physical Therapy Treatment/Discharge Note  Patient Details  Name: [name] MRN: [id number] Date of Birth: 1946-02-08 Referring Provider:  Nolene Ebbs, MD  Encounter Date: 10/13/2014    Past Medical History  Diagnosis Date  . 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 Arthritis     Back   . Anemia   . GERD (gastroesophageal reflux disease)   . Colon polyp     Tubular Adenoma   . HOH (hard of hearing)   . Primary localized osteoarthritis of right knee   . History of right bundle branch block (RBBB)   . Cough productive of clear sputum 06/22/2014  . Acute upper respiratory infection 07/06/2014  . Hypertension     Past Surgical History  Procedure Laterality Date  . Abdominal hysterectomy  2012  . Total knee arthroplasty  04/29/2011    Procedure: TOTAL KNEE ARTHROPLASTY;  Surgeon: Lorn Junes, MD;  Location: [facility];  Service: Orthopedics;  Laterality: Left;  [facility] [place] THIS CASE  . Colonoscopy w/ biopsies    . Joint replacement    . Cholecystectomy N/A 03/09/2013    Procedure: LAPAROSCOPIC CHOLECYSTECTOMY;  Surgeon: Gayland Curry, MD;  Location: [facility];  Service: General;  Laterality: N/A;  . Total knee arthroplasty Right 07/04/2014    Procedure: TOTAL KNEE ARTHROPLASTY;  Surgeon: Elsie Saas, MD;  Location: [facility];  Service: Orthopedics;  Laterality: Right;    There were no vitals filed for this visit.  Visit Diagnosis:  Decreased ROM of right knee  Abnormality of gait  Edema  Pain in right knee      Subjective Assessment - 10/13/14 0854    Subjective Pt without pain today and will be starting an aerobics class at church   Patient Stated Goals  bend the knee and walk without a cane, household chores   Currently in Pain? No/denies            [facility] PT Assessment - 10/13/14 0855    Observation/Other  Assessments   Focus on Therapeutic Outcomes (FOTO)  41% limitation predicted 39%   AROM   Right Knee Extension 0   Right Knee Flexion 108   Strength   Right Hip Flexion 5/5   Right Hip ABduction 4+/5   Left Hip Flexion 5/5   Left Hip ABduction 4+/5   Right Knee Flexion 4+/5   Right Knee Extension 4+/5   Left Knee Flexion 5/5   Left Knee Extension 5/5   Ambulation/Gait   Gait Comments able to go up 16 steps without holding onto rail                     [facility] Adult PT Treatment/Exercise - 10/13/14 0855    Ambulation/Gait   Stairs Yes   Number of Stairs 16   Height of Stairs 6   Knee/Hip Exercises: Aerobic   Stationary Bike Nustep  L7 6 minutes.   Knee/Hip Exercises: Standing   Lateral Step Up Limitations 10 reps  with bil with opp hip flex to 90   Forward Step Up Right;2 sets;10 reps;Hand Hold: 1;Step Height: 8"   Step Down 2 sets;10 reps   Step Down Limitations small motions   Wall Squat 10 reps  5 X with 3 LB [place]   Stairs 16 steps, step over step without  use of 1 rail.  Other Standing Knee Exercises Isometric wall Hip abduction 10 reps 5 seconds, both legs knee slightly flexed.   Knee/Hip Exercises: Supine   Other Supine Knee/Hip Exercises clams with green band 15 reps   Other Supine Knee/Hip Exercises bridges x 10 in supine with VC  with incline board for head for comfort, decreased hip exten   Cryotherapy   Number Minutes Cryotherapy 10 Minutes   Cryotherapy Location Knee   Type of Cryotherapy --  cold pack   Manual Therapy   Soft tissue mobilization Patellar mobs and AA into flexion                PT Education - 10/13/14 0935    Education provided Yes   Education Details Reviewed HEP and exercise in standing, supine and sit to stand and answered questions about community wellness opportunities   Person(s) Educated Patient   Methods Explanation;Demonstration;Verbal cues   Comprehension Verbalized understanding;Returned demonstration           PT Short Term Goals - 09/08/14 0903    PT SHORT TERM GOAL #1   Title "Independent with initial HEP   Status Achieved   PT SHORT TERM GOAL #2   Title Pt will reduce from 5/10 to 3/10 with functional acitivities   Status Achieved   PT SHORT TERM GOAL #3   Title "Demonstrate and verbalize understanding of condition management including RICE, positioning, use of A.D., HEP.    Status Achieved           PT Long Term Goals - 10/13/14 0856    PT LONG TERM GOAL #1   Title "Pt will be independent with advanced HEP.    Baseline independent with exercises so far.   Time 6   Period Weeks   Status Achieved   PT LONG TERM GOAL #2   Title "Pain will decrease to 1/10 with all functional activities   Baseline Pt with 0/10 pain   Time 6   Period Weeks   Status Achieved   PT LONG TERM GOAL #3   Title "R knee AAROM flexion will improve to 2- 95 degrees for improved mobility to rise up and down from chair   Baseline 0-108   Time 8   Period Weeks   Status Achieved   PT LONG TERM GOAL #4   Title "FOTO will improve from 43% limitation   to  39% limitation   indicating improved functional mobility .    Baseline Pt improved to 41% limitation    Time 6   Period Weeks   Status Partially Met   PT LONG TERM GOAL #5   Title Improve functional mobility in order to walk without assistive device at communitiy level 2.36f/sec   Baseline achieved 2.857fsec    Time 8   Period Weeks   Status Achieved   PT LONG TERM GOAL #6   Title Pt to achieve 4+/5 knee Right strength in order to negotiate steps with 75% improved confidence and safety   Baseline 5/5 MMT mostly with 4+/5 R knee ext   Time 6   Period Weeks   Status Achieved               Problem List Patient Active Problem List   Diagnosis Date Noted  . Acute upper respiratory infection 07/06/2014  . 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 DJD (degenerative joint disease) of knee  07/04/2014  . Primary localized osteoarthritis of right knee   . Arthritis   .  Anemia   . GERD (gastroesophageal reflux disease)   . Colon polyp   . HOH (hard of hearing)   . Postoperative anemia due to acute blood loss 05/01/2011  . 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  Patient goals were met. Patient is being discharged due to meeting the stated rehab goals.  ????? and being pleased with current functional progress          Voncille Lo, PT 10/13/2014 9:59 AM Phone: [phone] Fax: [phone]

## 2014-10-18 ENCOUNTER — Encounter: Payer: Medicare Other | Admitting: Physical Therapy

## 2014-10-20 ENCOUNTER — Encounter: Payer: Medicare Other | Admitting: Physical Therapy

## 2014-11-11 DIAGNOSIS — Z1231 Encounter for screening mammogram for malignant neoplasm of breast: Secondary | ICD-10-CM | POA: Diagnosis not present

## 2014-12-15 DIAGNOSIS — H409 Unspecified glaucoma: Secondary | ICD-10-CM | POA: Diagnosis not present

## 2014-12-15 DIAGNOSIS — J309 Allergic rhinitis, unspecified: Secondary | ICD-10-CM | POA: Diagnosis not present

## 2014-12-15 DIAGNOSIS — J302 Other seasonal allergic rhinitis: Secondary | ICD-10-CM | POA: Diagnosis not present

## 2014-12-15 DIAGNOSIS — K219 Gastro-esophageal reflux disease without esophagitis: Secondary | ICD-10-CM | POA: Diagnosis not present

## 2014-12-15 DIAGNOSIS — Z23 Encounter for immunization: Secondary | ICD-10-CM | POA: Diagnosis not present

## 2015-02-06 DIAGNOSIS — Z96653 Presence of artificial knee joint, bilateral: Secondary | ICD-10-CM | POA: Diagnosis not present

## 2016-06-14 ENCOUNTER — Other Ambulatory Visit: Payer: Self-pay | Admitting: Internal Medicine

## 2016-06-14 DIAGNOSIS — E2839 Other primary ovarian failure: Secondary | ICD-10-CM

## 2016-06-21 ENCOUNTER — Ambulatory Visit
Admission: RE | Admit: 2016-06-21 | Discharge: 2016-06-21 | Disposition: A | Discharge status: Home / Self Care | Payer: Medicare Other | Source: Ambulatory Visit | Attending: Internal Medicine | Admitting: Internal Medicine

## 2016-06-21 DIAGNOSIS — E2839 Other primary ovarian failure: Secondary | ICD-10-CM

## 2016-07-29 ENCOUNTER — Encounter: Payer: Self-pay | Admitting: *Deleted

## 2016-08-14 ENCOUNTER — Encounter: Payer: Self-pay | Admitting: Internal Medicine

## 2016-08-29 ENCOUNTER — Encounter: Payer: Self-pay | Admitting: Internal Medicine

## 2016-10-03 ENCOUNTER — Telehealth: Payer: Self-pay | Admitting: *Deleted

## 2016-10-03 NOTE — Telephone Encounter (Signed)
Office visit sounds best to me before scheduling hospital procedure Can clarify diverticulitis history as well as anticoagulation status

## 2016-10-03 NOTE — Telephone Encounter (Signed)
OV scheduled with pt per Dr Hilarie Fredrickson- sch for 8-22 wed at 130 pm with A [place] -- Marie PV  canc PV for 8-29 but did mot cancel colon yet in case BMI is incorrect - marie PV

## 2016-10-03 NOTE — Telephone Encounter (Signed)
Dr Hilarie Fredrickson,  I was reviewing this chart this morning for a PV scheduled for 8-29 with a colon scheduled with you on 9-19, Wednesday. This is a 71 yr old that saw her PCP 09-10-16 with diverticulitis about 12 weeks prior. In her epic record it says she is on Eliquis, but this is not lised in her note from Dr Jeanie Cooks. She does however have a BMI of 50 noted in her last  note. She has a hx of GERD, TICS, RBBB, MRSA. She had her last colon with Sharlett Iles 10-18-11 with a TA polyp.    Do you want her to have an OV prior or can she be a direct colon at hospital.  Please advise, thanks for your time, Lelan Pons

## 2016-10-09 ENCOUNTER — Ambulatory Visit (INDEPENDENT_AMBULATORY_CARE_PROVIDER_SITE_OTHER): Payer: Medicare Other | Admitting: Physician Assistant

## 2016-10-09 ENCOUNTER — Encounter (INDEPENDENT_AMBULATORY_CARE_PROVIDER_SITE_OTHER): Payer: Self-pay

## 2016-10-09 ENCOUNTER — Encounter: Payer: Self-pay | Admitting: Physician Assistant

## 2016-10-09 VITALS — BP 122/60 | HR 84 | Ht 59.25 in | Wt 241.1 lb

## 2016-10-09 DIAGNOSIS — R110 Nausea: Secondary | ICD-10-CM | POA: Diagnosis not present

## 2016-10-09 DIAGNOSIS — Z860100 Personal history of colon polyps, unspecified: Secondary | ICD-10-CM

## 2016-10-09 DIAGNOSIS — R1030 Lower abdominal pain, unspecified: Secondary | ICD-10-CM

## 2016-10-09 MED ORDER — NA SULFATE-K SULFATE-MG SULF 17.5-3.13-1.6 GM/177ML PO SOLN: ORAL | 0 refills | Status: DC

## 2016-10-09 NOTE — Progress Notes (Addendum)
Subjective:    Patient ID: [name], female    DOB: [birth year], 71 y.o.   MRN: [id number]  HPI [name] is a pleasant 71 year old African-American female, known previously to Dr. Sharlett Iles who comes in today to discuss recall colonoscopy. She was scheduled office visit because of BMI, and question of anticoagulation. Patient last had colonoscopy in August 2013 showing moderate diverticulosis in the right colon severe diverticulosis in the left colon and had a 1 cm polyp removed from the rectum which was a tubular adenoma. She was advised to have 5 year interval follow-up. She also has history of GERD, degenerative joint disease and is status post bilateral knee replacements. The last knee replacement was about 2 years ago at which point she was on eliquis short-term but has not been on any anticoagulation since. She is also status post laparoscopic cholecystectomy in 2015. Patient says she has had some nausea over the past couple of months and queasiness which seems to be present off and on independent of eating, or what she eats. She's not had any regular vomiting but did vomit this morning after eating a large breakfast. She also feels that she is lactose intolerant and says sometimes milk "goes right through me". She has had some recent aching in her lower abdomen bilaterally. Bowel movements have been normal, denies problems with constipation or diarrhea, no melena or hematochezia. She was treated with a course of Cipro and Flagyl by her PCP about a month ago for possible diverticulitis but says this did not change her symptoms.  Review of Systems Pertinent positive and negative review of systems were noted in the above HPI section.  All other review of systems was otherwise negative.  Outpatient Encounter Prescriptions as of 10/09/2016  Medication Sig  . furosemide (LASIX) 20 MG tablet Take 20 mg by mouth daily as needed for fluid (Does not take if going out and about).  . pantoprazole  (PROTONIX) 40 MG tablet Take 40 mg by mouth daily.  Marland Kitchen acetaminophen (TYLENOL) 325 MG tablet Take 2 tablets (650 mg total) by mouth every 6 (six) hours as needed for mild pain (or Fever >/= 101). (Patient not taking: Reported on 10/09/2016)  . Na Sulfate-K Sulfate-Mg Sulf 17.5-3.13-1.6 GM/180ML SOLN Take it as directed for colonoscopy.  . [DISCONTINUED] Alum & Mag Hydroxide-Simeth (GI COCKTAIL) SUSP suspension Take 10 mLs by mouth 2 (two) times daily as needed for indigestion. Shake well.  . [DISCONTINUED] apixaban (ELIQUIS) 2.5 MG TABS tablet Take 1 tablet (2.5 mg total) by mouth every 12 (twelve) hours.  . [DISCONTINUED] docusate sodium (COLACE) 100 MG capsule 1 tab 2 times a day while on narcotics.  STOOL SOFTENER  . [DISCONTINUED] doxycycline (VIBRA-TABS) 100 MG tablet Take 1 tablet (100 mg total) by mouth every 12 (twelve) hours.  . [DISCONTINUED] mupirocin ointment (BACTROBAN) 2 % Place 1 application into the nose 2 (two) times daily.  . [DISCONTINUED] oxyCODONE (OXY IR/ROXICODONE) 5 MG immediate release tablet 1-2 tablets every 4-6 hrs as needed for pain  . [DISCONTINUED] polyethylene glycol (MIRALAX / GLYCOLAX) packet 17 grams in 8 ounces of water twice a day until bowel movements are regular  . [DISCONTINUED] predniSONE (STERAPRED UNI-PAK 21 TAB) 5 MG (21) TBPK tablet Take 1 tablet (5 mg total) by mouth daily. 6 day dosepack po  . [DISCONTINUED] triamcinolone cream (KENALOG) 0.1 % Apply 1 application topically 2 (two) times daily.   No facility-administered encounter medications on file as of 10/09/2016.    No Known Allergies  Patient Active Problem List   Diagnosis Date Noted  . Acute upper respiratory infection 07/06/2014  . 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 DJD (degenerative joint disease) of knee 07/04/2014  . Primary localized osteoarthritis of right knee   . Arthritis   . Anemia   . GERD (gastroesophageal reflux disease)   . Colon polyp   .  HOH (hard of hearing)   . Postoperative anemia due to acute blood loss 05/01/2011  . 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   Social History   Social History  . Marital status: Divorced    Spouse name: N/A  . Number of children: 1  . Years of education: N/A   Occupational History  . Retired     Social History Main Topics  . Smoking status: Former Smoker    Years: 1.00    Quit date: 04/22/1988  . Smokeless tobacco: Never Used  . Alcohol use No  . Drug use: No  . Sexual activity: Yes    Birth control/ protection: Surgical   Other Topics Concern  . Not on file   Social History Narrative   Daily caffeine     [name]'s family history includes Alzheimer's disease in her father; Arthritis in her mother; Breast cancer in her other; Diabetes in her sister; Hypertension in her brother, brother, mother, sister, sister, sister, and sister; Stroke in her brother.      Objective:    Vitals:   10/09/16 1332  BP: 122/60  Pulse: 84    Physical Exam   well-developed older African-American female in no acute distress, very pleasant blood pressure 122/60 pulse 84, height 4 foot 11, weight 241, BMI 48.2. HEENT ;nontraumatic normocephalic EOMI PERRLA sclera anicteric, Cardiovascular; regular rate and rhythm with S1-S2 no murmur or gallop, Pulmonary ;clear bilaterally, Abdomen; obese, soft, nontender nondistended bowel sounds are active, there is no focal tenderness no guarding or rebound  , no palpable mass or hepatosplenomegaly, Rectal ;exam not done, Extremities ;no clubbing cyanosis or edema skin warm and dry, Neuropsych; mood and affect appropriate       Assessment & Plan:   #44 71 year old female with history of tubular adenomatous colon polyp 2013 due for follow-up colonoscopy. BMI currently less than 50 and not on any anticoagulation #2 GERD-stable , uses Protonix as needed #3 status post laparoscopic cholecystectomy #4 morbid obesity-BMI 48.2  #5 degenerative joint disease #6  diverticulosis #7  2 month history of intermittent nausea and vague lower abdominal achy discomfort bilaterally. Etiology not clear no response to recent course of antibiotics for diverticulitis . #8 lactose intolerance  Plan; Patient will be scheduled for colonoscopy with Dr. Hilarie Fredrickson. Procedure discussed in detail with patient including risks and benefits and she is agreeable to proceed. She is advised to resume Protonix 40 mg by mouth every morning before meals breakfast, we'll see if this helps with her intermittent queasiness Patient asked to try Lactaid milk or Lactaid tablets by her to consumption of dairy products. We discussed CT of the abdomen and pelvis. She is looking forward to going on a trip next week, and would like to proceed with colonoscopy first and if unrevealing and lower abdominal symptoms persisting, can schedule for CT of the abdomen and pelvis after colonoscopy.     Blayden Conwell S Elieser Tetrick PA-C 10/09/2016   Cc: Nolene Ebbs, MD   .Addendum: Reviewed and agree with initial management. Pyrtle, Lajuan Lines, MD

## 2016-10-09 NOTE — Patient Instructions (Signed)
Take Protonix ( Pantoprazole Sodium 40 mg)  1 tablet every morning, every day. You can take Lactaid tablets- take 2-3 before consuming milk products.   You have been scheduled for a colonoscopy. Please follow written instructions given to you at your visit today.  Please pick up your prep supplies at the pharmacy within the next 1-3 days. If you use inhalers (even only as needed), please bring them with you on the day of your procedure. Your physician has requested that you go to www.startemmi.com and enter the access code given to you at your visit today. This web site gives a general overview about your procedure. However, you should still follow specific instructions given to you by our office regarding your preparation for the procedure.

## 2016-10-23 ENCOUNTER — Encounter: Payer: Self-pay | Admitting: Internal Medicine

## 2016-11-06 ENCOUNTER — Ambulatory Visit (AMBULATORY_SURGERY_CENTER): Payer: Medicare Other | Admitting: Gastroenterology

## 2016-11-06 ENCOUNTER — Other Ambulatory Visit: Payer: Self-pay | Admitting: Emergency Medicine

## 2016-11-06 ENCOUNTER — Ambulatory Visit (INDEPENDENT_AMBULATORY_CARE_PROVIDER_SITE_OTHER)
Admission: RE | Admit: 2016-11-06 | Discharge: 2016-11-06 | Disposition: A | Discharge status: Home / Self Care | Payer: Medicare Other | Source: Ambulatory Visit | Attending: Gastroenterology | Admitting: Gastroenterology

## 2016-11-06 VITALS — BP 116/69 | HR 88 | Temp 97.1°F | Resp 17 | Ht 59.0 in | Wt 241.0 lb

## 2016-11-06 DIAGNOSIS — R109 Unspecified abdominal pain: Secondary | ICD-10-CM

## 2016-11-06 DIAGNOSIS — K5732 Diverticulitis of large intestine without perforation or abscess without bleeding: Secondary | ICD-10-CM

## 2016-11-06 DIAGNOSIS — K668 Other specified disorders of peritoneum: Secondary | ICD-10-CM

## 2016-11-06 DIAGNOSIS — Z86010 Personal history of colonic polyps: Secondary | ICD-10-CM

## 2016-11-06 MED ORDER — SODIUM CHLORIDE 0.9 % IV SOLN: 500.0000 mL | INTRAVENOUS | Status: DC

## 2016-11-06 MED ORDER — AMOXICILLIN-POT CLAVULANATE 875-125 MG PO TABS: ORAL_TABLET | ORAL | 0 refills | Status: DC

## 2016-11-06 NOTE — Progress Notes (Signed)
1228- xray results called to Dr. Ardis Hughs and ok received to D/C pt.  He wants her to start the Augmentin prescribed today.  Information given to pt and she states she will pick up RX and start today.  She states she is feeling better than she did, rating about a "5."  Encouraged her to ambulate and drink warm fluids when she get home and to call back if she has any further needs.  Understanding voiced.

## 2016-11-06 NOTE — Op Note (Signed)
[facility] Patient Name: [name] Procedure Date: 11/06/2016 10:38 AM MRN: [id number] Endoscopist: Milus Banister , MD Age: 71 Referring MD:  Date of Birth: 11-07-1945 Gender: Female Account #: [id number] Procedure:                Colonoscopy Indications:              High risk colon cancer surveillance: Personal                            history of colonic polyps; 1cm adenoma removed Dr.                            Sharlett Iles colonoscopy 5 years ago; also recent abd                            discomforts, nausea/vomiting Medicines:                Monitored Anesthesia Care Procedure:                Pre-Anesthesia Assessment:                           - Prior to the procedure, a History and Physical                            was performed, and patient medications and                            allergies were reviewed. The patient's tolerance of                            previous anesthesia was also reviewed. The risks                            and benefits of the procedure and the sedation                            options and risks were discussed with the patient.                            All questions were answered, and informed consent                            was obtained. Prior Anticoagulants: The patient has                            taken no previous anticoagulant or antiplatelet                            agents. ASA Grade Assessment: II - A patient with                            mild systemic disease. After reviewing the risks  and benefits, the patient was deemed in                            satisfactory condition to undergo the procedure.                           After obtaining informed consent, the colonoscope                            was passed under direct vision. Throughout the                            procedure, the patient's blood pressure, pulse, and                            oxygen saturations were monitored  continuously. The                            Colonoscope was introduced through the anus and                            advanced to the the cecum, identified by                            appendiceal orifice and ileocecal valve. The                            colonoscopy was performed without difficulty. The                            patient tolerated the procedure well. The quality                            of the bowel preparation was adequate. The                            ileocecal valve, appendiceal orifice, and rectum                            were photographed. Scope In: 10:42:00 AM Scope Out: 10:55:14 AM Scope Withdrawal Time: 0 hours 5 minutes 27 seconds  Total Procedure Duration: 0 hours 13 minutes 14 seconds  Findings:                 Multiple small and large-mouthed diverticula were                            found in the entire colon. In the sigmoid segment                            the lumen was very tortuous, the mucosa was very                            edematous and there was focal are of liquid  purulence associated with a particularly infalmmed                            diverticulum. I suspect diverticulitis                            (acute/chronic?).                           The exam was otherwise without abnormality on                            direct and retroflexion views. Complications:            No immediate complications. Estimated blood loss:                            None. Estimated Blood Loss:     Estimated blood loss: none. Impression:               - Diverticulosis throughout the colon with likely                            diverticulitis in the sigmoid segement                            (acute/chronic?)                           - The examination was otherwise normal on direct                            and retroflexion views.                           - No specimens collected. Recommendation:           - Patient  has a contact number available for                            emergencies. The signs and symptoms of potential                            delayed complications were discussed with the                            patient. Return to normal activities tomorrow.                            Written discharge instructions were provided to the                            patient.                           - Resume previous diet.                           -  Continue present medications. Please start the                            augmentin DS that we called into your pharmacy                            today (875/125) one pill twice daily for 10 days,                            no refills. We will arrange ROV with Amy Esterwoord                            or Dr. Ardis Hughs in next 3-4 weeks as well.                           - Repeat colonoscopy in 5 years for surveillance                            given the 1cm adenom removed 5 years ago. Milus Banister, MD 11/06/2016 11:04:58 AM This report has been signed electronically.

## 2016-11-06 NOTE — Progress Notes (Signed)
1218- pt back from xray and sitting in the recliner in the mini conference room.  States abd pain is a "little better, a 6 now."

## 2016-11-06 NOTE — Progress Notes (Signed)
Pt's states no medical or surgical changes since previsit or office visit. 

## 2016-11-06 NOTE — Progress Notes (Signed)
Report to PACU, RN, vss, BBS= Clear.  

## 2016-11-06 NOTE — Progress Notes (Signed)
1150 Dr. Ardis Hughs called because pt. Complains of abdominal pain on level of "9".  Her abdomen is slightly Distended.  Gave levsin 0.25mg . At 1135.  Passing gas on admission to recovery.  State flat and upright Abdomenal xrays ordered to rule out perforation/free air.  To call results to Dr. Ardis Hughs.    1200 To xray via wheelchair.  Will Return to recovery after x-ray.

## 2016-11-06 NOTE — Patient Instructions (Signed)
YOU HAD AN ENDOSCOPIC PROCEDURE TODAY AT [facility]:   Refer to the procedure report that was given to you for any specific questions about what was found during the examination.  If the procedure report does not answer your questions, please call your gastroenterologist to clarify.  If you requested that your care partner not be given the details of your procedure findings, then the procedure report has been included in a sealed envelope for you to review at your convenience later.  YOU SHOULD EXPECT: Some feelings of bloating in the abdomen. Passage of more gas than usual.  Walking can help get rid of the air that was put into your GI tract during the procedure and reduce the bloating. If you had a lower endoscopy (such as a colonoscopy or flexible sigmoidoscopy) you may notice spotting of blood in your stool or on the toilet paper. If you underwent a bowel prep for your procedure, you may not have a normal bowel movement for a few days.  Please Note:  You might notice some irritation and congestion in your nose or some drainage.  This is from the oxygen used during your procedure.  There is no need for concern and it should clear up in a day or so.  SYMPTOMS TO REPORT IMMEDIATELY:   Following lower endoscopy (colonoscopy or flexible sigmoidoscopy):  Excessive amounts of blood in the stool  Significant tenderness or worsening of abdominal pains  Swelling of the abdomen that is new, acute  Fever of 100F or higher  For urgent or emergent issues, a gastroenterologist can be reached at any hour by calling [phone].   DIET:  We do recommend a small meal at first, but then you may proceed to your regular diet.  Drink plenty of fluids but you should avoid alcoholic beverages for 24 hours.  ACTIVITY:  You should plan to take it easy for the rest of today and you should NOT DRIVE or use heavy machinery until tomorrow (because of the sedation medicines used during the test).     FOLLOW UP: Our staff will call the number listed on your records the next business day following your procedure to check on you and address any questions or concerns that you may have regarding the information given to you following your procedure. If we do not reach you, we will leave a message.  However, if you are feeling well and you are not experiencing any problems, there is no need to return our call.  We will assume that you have returned to your regular daily activities without incident.  SIGNATURES/CONFIDENTIALITY: You and/or your care partner have signed paperwork which will be entered into your electronic medical record.  These signatures attest to the fact that that the information above on your After Visit Summary has been reviewed and is understood.  Full responsibility of the confidentiality of this discharge information lies with you and/or your care-partner.  Please read over handouts about diverticulitis and high fiber diet  The prescription for Augmentin was sent to your [facility] pharmacy- please take 1 tablet twice daily for 10 days.  Next colonoscopy- 5 years  Continue your normal medications

## 2016-11-07 ENCOUNTER — Telehealth: Payer: Self-pay

## 2016-11-07 NOTE — Telephone Encounter (Signed)
  Follow up Call-  Call back number 11/06/2016  Post procedure Call Back phone  # [phone]  Permission to leave phone message Yes  Some recent data might be hidden     Patient questions:  Do you have a fever, pain , or abdominal swelling? No. Pain Score  0 *  Have you tolerated food without any problems? Yes.    Have you been able to return to your normal activities? Yes.    Do you have any questions about your discharge instructions: Diet   No. Medications  No. Follow up visit  No.  Do you have questions or concerns about your Care? No.  Actions: * If pain score is 4 or above: No action needed, pain <4.

## 2016-11-07 NOTE — Telephone Encounter (Signed)
  Follow up Call-  Call back number 11/06/2016  Post procedure Call Back phone  # [phone]  Permission to leave phone message Yes  Some recent data might be hidden     Called and no answer, mailbox was full so no message left.

## 2016-11-27 ENCOUNTER — Ambulatory Visit (INDEPENDENT_AMBULATORY_CARE_PROVIDER_SITE_OTHER): Payer: Medicare Other | Admitting: Physician Assistant

## 2016-11-27 ENCOUNTER — Encounter: Payer: Self-pay | Admitting: Physician Assistant

## 2016-11-27 ENCOUNTER — Encounter (INDEPENDENT_AMBULATORY_CARE_PROVIDER_SITE_OTHER): Payer: Self-pay

## 2016-11-27 VITALS — BP 126/80 | HR 78 | Ht 59.25 in | Wt 242.2 lb

## 2016-11-27 DIAGNOSIS — Z86010 Personal history of colonic polyps: Secondary | ICD-10-CM

## 2016-11-27 DIAGNOSIS — K5792 Diverticulitis of intestine, part unspecified, without perforation or abscess without bleeding: Secondary | ICD-10-CM | POA: Diagnosis not present

## 2016-11-27 MED ORDER — AMOXICILLIN-POT CLAVULANATE 875-125 MG PO TABS: 1.0000 | ORAL_TABLET | Freq: Two times a day (BID) | ORAL | 0 refills | Status: AC

## 2016-11-27 NOTE — Progress Notes (Signed)
I agree with the above note, plan 

## 2016-11-27 NOTE — Patient Instructions (Addendum)
We have sent the following medications to your pharmacy for you to pick up at your convenience: [facility]. 1. Augmentin 875/125 mg - take for 7 more days, 1 tablet twice daily.   We have provided you with a Diverticulosis/Divertiulitis handout.  Call us if you have recurrent pain . You will need a follow up colonoscopy in 5 years.  You will be contacted by letter.

## 2016-11-27 NOTE — Progress Notes (Signed)
Subjective:    Patient ID: [name], female    DOB: 1945-10-22, 71 y.o.   MRN: [id number]  HPI [name] is a very nice 71 year old African-American female known to Dr. Ardis Duncan who comes in today for follow-up after recent colonoscopy. Patient had undergone follow-up colonoscopy on 11/06/2016 because of history of a 1 cm tubular adenoma on previous colonoscopy 2013. She was found to have multiple diverticuli throughout the entire colon with a very tortuous sigmoid colon with edematous mucosa and an area of focal purulence associated with an inflamed diverticulum. She was started on a course of Augmentin 875 mg by mouth twice a day for 10 days to treat for active diverticulitis. No polyps found. She says she was having left lower quadrant pain after the colonoscopy and perhaps that it is worst was 7 out of 10. She feels better but is still having some mild discomfort rated at a 2. She's not had any fever or chills. She states the antibiotic give her some mild diarrhea had which has resolved, no melena or hematochezia. Appetite has been fine.  Review of Systems Pertinent positive and negative review of systems were noted in the above HPI section.  All other review of systems was otherwise negative.  Outpatient Encounter Prescriptions as of 11/27/2016  Medication Sig  . acetaminophen (TYLENOL) 325 MG tablet Take 2 tablets (650 mg total) by mouth every 6 (six) hours as needed for mild pain (or Fever >/= 101).  . furosemide (LASIX) 20 MG tablet Take 20 mg by mouth daily as needed for fluid (Does not take if going out and about).  . pantoprazole (PROTONIX) 40 MG tablet Take 40 mg by mouth daily.  Marland Kitchen amoxicillin-clavulanate (AUGMENTIN) 875-125 MG tablet Take 1 tablet by mouth 2 (two) times daily.  . [DISCONTINUED] amoxicillin-clavulanate (AUGMENTIN) 875-125 MG tablet Take 1 tablet twice daily for 10 days (Patient not taking: Reported on 11/27/2016)   No facility-administered encounter medications on  file as of 11/27/2016.    No Known Allergies Patient Active Problem List   Diagnosis Date Noted  . Acute upper respiratory infection 07/06/2014  . 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 DJD (degenerative joint disease) of knee 07/04/2014  . Primary localized osteoarthritis of right knee   . Arthritis   . Anemia   . GERD (gastroesophageal reflux disease)   . Colon polyp   . HOH (hard of hearing)   . Postoperative anemia due to acute blood loss 05/01/2011  . 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   Social History   Social History  . Marital status: Divorced    Spouse name: N/A  . Number of children: 1  . Years of education: N/A   Occupational History  . Retired     Social History Main Topics  . Smoking status: Former Smoker    Years: 1.00    Quit date: 04/22/1988  . Smokeless tobacco: Never Used  . Alcohol use No  . Drug use: No  . Sexual activity: Yes    Birth control/ protection: Surgical   Other Topics Concern  . Not on file   Social History Narrative   Daily caffeine     [name]'s family history includes Alzheimer's disease in her father; Arthritis in her mother; Breast cancer in her other; Diabetes in her sister; Hypertension in her brother, brother, mother, sister, sister, sister, and sister; Stroke in her brother.      Objective:    Vitals:   11/27/16 0829  BP:  126/80  Pulse: 78  SpO2: 96%    Physical Exam  well-developed older African-American female in no acute distress, pleasant blood pressure 126/80 pulse 78, BMI 48.5. HEENT; nontraumatic normocephalic EOMI PERRLA sclera anicteric, Cardiovascular; regular rate and rhythm with S1-S2 no murmur rub or gallop, Pulmonary; clear bilaterally, Abdomen; morbidly obese, soft, bowel sounds are present she is tender deep in the left lower quadrant there is no guarding or rebound no palpable mass or hepatosplenomegaly, Rectal ;exam not done, Extremities; no clubbing cyanosis or edema skin  warm and dry, Neuropsych; mood and affect appropriate       Assessment & Plan:   #71 71 year old African-American female with acute sigmoid diverticulitis noted at recent colonoscopy done 11/06/2016. She is improved but has not completely resolved symptoms after a 10 day course of Augmentin. #2 History of adenomatous colon polyps-no polyps found on colonoscopy September 2018, plan follow-up 2023 #3 GERD stable use PPI as needed #4 morbid obesity #5 status post cholecystectomy  Plan; Will treat patient with 7 more days of Augmentin 875 mg by mouth twice a day. She is asked to call back if symptoms have not completely resolved by the time she finishes antibiotics. We discussed general management of diverticulosis and diverticulitis. Patient will need follow-up colonoscopy in 5 years. She'll follow up with Dr. Ardis Duncan or myself on an as-needed basis in the interim.   [name] S Leslie Duvall PA-C 11/27/2016   Cc: Leslie Ebbs, MD

## 2017-02-02 ENCOUNTER — Emergency Department (HOSPITAL_COMMUNITY): Payer: Medicare Other

## 2017-02-02 ENCOUNTER — Emergency Department (HOSPITAL_COMMUNITY)
Admission: EM | Admit: 2017-02-02 | Discharge: 2017-02-02 | Disposition: A | Discharge status: Home / Self Care | Payer: Medicare Other | Attending: Emergency Medicine | Admitting: Emergency Medicine

## 2017-02-02 ENCOUNTER — Encounter (HOSPITAL_COMMUNITY): Payer: Self-pay | Admitting: Emergency Medicine

## 2017-02-02 DIAGNOSIS — Z87891 Personal history of nicotine dependence: Secondary | ICD-10-CM | POA: Insufficient documentation

## 2017-02-02 DIAGNOSIS — H9202 Otalgia, left ear: Secondary | ICD-10-CM | POA: Diagnosis present

## 2017-02-02 DIAGNOSIS — R0981 Nasal congestion: Secondary | ICD-10-CM | POA: Insufficient documentation

## 2017-02-02 DIAGNOSIS — I10 Essential (primary) hypertension: Secondary | ICD-10-CM | POA: Insufficient documentation

## 2017-02-02 DIAGNOSIS — J029 Acute pharyngitis, unspecified: Secondary | ICD-10-CM | POA: Diagnosis not present

## 2017-02-02 LAB — CBC WITH DIFFERENTIAL/PLATELET
Basophils Absolute: 0 10*3/uL (ref 0.0–0.1)
Basophils Relative: 0 %
Eosinophils Absolute: 0.1 10*3/uL (ref 0.0–0.7)
Eosinophils Relative: 2 %
HCT: 38 % (ref 36.0–46.0)
Hemoglobin: 12.3 g/dL (ref 12.0–15.0)
Lymphocytes Relative: 43 %
Lymphs Abs: 1.9 10*3/uL (ref 0.7–4.0)
MCH: 28.1 pg (ref 26.0–34.0)
MCHC: 32.4 g/dL (ref 30.0–36.0)
MCV: 86.8 fL (ref 78.0–100.0)
Monocytes Absolute: 0.4 10*3/uL (ref 0.1–1.0)
Monocytes Relative: 8 %
Neutro Abs: 2 10*3/uL (ref 1.7–7.7)
Neutrophils Relative %: 47 %
Platelets: 313 10*3/uL (ref 150–400)
RBC: 4.38 MIL/uL (ref 3.87–5.11)
RDW: 13.5 % (ref 11.5–15.5)
WBC: 4.4 10*3/uL (ref 4.0–10.5)

## 2017-02-02 LAB — BASIC METABOLIC PANEL
Anion gap: 8 (ref 5–15)
BUN: 8 mg/dL (ref 6–20)
CO2: 23 mmol/L (ref 22–32)
Calcium: 9 mg/dL (ref 8.9–10.3)
Chloride: 107 mmol/L (ref 101–111)
Creatinine, Ser: 0.59 mg/dL (ref 0.44–1.00)
GFR calc Af Amer: >60 mL/min (ref >60)
GFR calc non Af Amer: >60 mL/min (ref >60)
Glucose, Bld: 99 mg/dL (ref 65–99)
Potassium: 3.7 mmol/L (ref 3.5–5.1)
Sodium: 138 mmol/L (ref 135–145)

## 2017-02-02 LAB — RAPID STREP SCREEN (MED CTR MEBANE ONLY): Streptococcus, Group A Screen (Direct): NEGATIVE

## 2017-02-02 MED ORDER — ACYCLOVIR 400 MG PO TABS: 400.0000 mg | ORAL_TABLET | Freq: Four times a day (QID) | ORAL | 0 refills | Status: DC

## 2017-02-02 MED ORDER — TRAMADOL HCL 50 MG PO TABS: 50.0000 mg | ORAL_TABLET | Freq: Four times a day (QID) | ORAL | 0 refills | Status: DC | PRN

## 2017-02-02 MED ORDER — ACETAMINOPHEN 325 MG PO TABS
650.0000 mg | ORAL_TABLET | Freq: Once | ORAL | Status: AC
  Administered 2017-02-02: 650 mg via ORAL
  Filled 2017-02-02: qty 2

## 2017-02-02 MED ORDER — IOPAMIDOL (ISOVUE-300) INJECTION 61%
INTRAVENOUS | Status: AC
Start: 2017-02-02 — End: 2017-02-02
  Administered 2017-02-02: 75 mL
  Filled 2017-02-02: qty 75

## 2017-02-02 MED ORDER — AMOXICILLIN-POT CLAVULANATE 875-125 MG PO TABS: 1.0000 | ORAL_TABLET | Freq: Two times a day (BID) | ORAL | 0 refills | Status: DC

## 2017-02-02 NOTE — ED Provider Notes (Signed)
[facility] EMERGENCY DEPARTMENT Provider Note   CSN: [id number] Arrival date & time: 02/02/17  5003     History   Chief Complaint Chief Complaint  Patient presents with  . Otalgia  . Nasal Congestion    HPI [name] is a 71 y.o. female.  HPI     71 year old female presents today with complaints of sore throat and left ear pain.  Patient notes symptoms started approximately 4 days ago.  She notes they have continue to persist.  She notes using Tylenol last night, no medications today.  Patient notes that she has been spitting more often, reports she is able to swallow, but has pain.  She denies any fever at home, denies any previous similar symptoms.  Patient reports that she had her tonsils removed when she was younger. Pt reports associated nasal congestion and dry nonproductive cough which she attributes to allergies.     Past Medical History:  Diagnosis Date  . Acute upper respiratory infection 07/06/2014  . Anemia   . Arthritis    Back   . Colon polyp    Tubular Adenoma   . Cough productive of clear sputum 06/22/2014  . GERD (gastroesophageal reflux disease)   . History of right bundle branch block (RBBB)   . HOH (hard of hearing)   . Hypertension   . 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 Primary localized osteoarthritis of right knee     Patient Active Problem List   Diagnosis Date Noted  . Acute upper respiratory infection 07/06/2014  . 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 DJD (degenerative joint disease) of knee 07/04/2014  . Primary localized osteoarthritis of right knee   . Arthritis   . Anemia   . GERD (gastroesophageal reflux disease)   . Colon polyp   . HOH (hard of hearing)   . Postoperative anemia due to acute blood loss 05/01/2011  . 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 ABDOMINAL HYSTERECTOMY  2012  . CHOLECYSTECTOMY N/A 03/09/2013   Procedure:  LAPAROSCOPIC CHOLECYSTECTOMY;  Surgeon: Gayland Curry, MD;  Location: [facility];  Service: General;  Laterality: N/A;  . COLONOSCOPY W/ BIOPSIES    . TOTAL KNEE ARTHROPLASTY  04/29/2011   Procedure: TOTAL KNEE ARTHROPLASTY;  Surgeon: Lorn Junes, MD;  Location: [facility];  Service: Orthopedics;  Laterality: Left;  DR Peever THIS CASE  . TOTAL KNEE ARTHROPLASTY Right 07/04/2014   Procedure: TOTAL KNEE ARTHROPLASTY;  Surgeon: Elsie Saas, MD;  Location: [facility];  Service: Orthopedics;  Laterality: Right;    OB History    No data available       Home Medications    Prior to Admission medications   Medication Sig Start Date End Date Taking? Authorizing Provider  acetaminophen (TYLENOL) 325 MG tablet Take 2 tablets (650 mg total) by mouth every 6 (six) hours as needed for mild pain (or Fever >/= 101). 07/06/14   Shepperson, Kirstin, PA-C  acyclovir (ZOVIRAX) 400 MG tablet Take 1 tablet (400 mg total) by mouth 4 (four) times daily. 02/02/17   Iris Tatsch, Dellis Filbert, PA-C  amoxicillin-clavulanate (AUGMENTIN) 875-125 MG tablet Take 1 tablet by mouth every 12 (twelve) hours. 02/02/17   Shanyah Gattuso, Dellis Filbert, PA-C  furosemide (LASIX) 20 MG tablet Take 20 mg by mouth daily as needed for fluid (Does not take if going out and about).    [provider]  pantoprazole ([place])  40 MG tablet Take 40 mg by mouth daily.    [provider]  traMADol (ULTRAM) 50 MG tablet Take 1 tablet (50 mg total) by mouth every 6 (six) hours as needed. 02/02/17   Okey Regal, PA-C    Family History Family History  Problem Relation Age of Onset  . Arthritis Mother   . Hypertension Mother   . Alzheimer's disease Father   . Diabetes Sister   . Hypertension Sister   . Hypertension Brother   . Stroke Brother   . Hypertension Brother   . Hypertension Sister   . Hypertension Sister   . Hypertension Sister   . Breast cancer Other        Niece  . Anesthesia problems Neg Hx   . Hypotension Neg  Hx   . Malignant hyperthermia Neg Hx   . Pseudochol deficiency Neg Hx   . Colon cancer Neg Hx     Social History Social History   Tobacco Use  . Smoking status: Former Smoker    Years: 1.00    Last attempt to quit: 04/22/1988    Years since quitting: 28.8  . Smokeless tobacco: Never Used  Substance Use Topics  . Alcohol use: No  . Drug use: No     Allergies   Patient has no known allergies.   Review of Systems Review of Systems  All other systems reviewed and are negative.    Physical Exam Updated Vital Signs BP 128/85 (BP Location: Left Arm)   Pulse 79   Temp 98.7 F (37.1 C) (Oral)   Resp 18   Ht 5\' 1"  (1.549 m)   Wt 110.2 kg (243 lb)   SpO2 100%   BMI 45.91 kg/m   Physical Exam  Constitutional: She is oriented to person, place, and time. She appears well-developed and well-nourished.  HENT:  Head: Normocephalic and atraumatic.  Bilateral TMs normal, no signs of otitis externa  Left-sided swelling noted about the tonsillar pillar and soft palate; minor erythema-no exudate noted, no signs of retropharyngeal abscess-slightl limited exam due to patient's ability to comply with exam  Bilateral tender anterior cervical lymphadenopathy-neck supple full range of motion no pain with range  No pooling of secretions  Eyes: Conjunctivae are normal. Pupils are equal, round, and reactive to light. Right eye exhibits no discharge. Left eye exhibits no discharge. No scleral icterus.  Neck: Normal range of motion. No JVD present. No tracheal deviation present.  Pulmonary/Chest: Effort normal. No stridor.  Neurological: She is alert and oriented to person, place, and time. Coordination normal.  Psychiatric: She has a normal mood and affect. Her behavior is normal. Judgment and thought content normal.  Nursing note and vitals reviewed.    ED Treatments / Results  Labs (all labs ordered are listed, but only abnormal results are displayed) Labs Reviewed  RAPID STREP  SCREEN (NOT AT [facility])  CULTURE, GROUP A STREP ([place])  CBC WITH DIFFERENTIAL/PLATELET  BASIC METABOLIC PANEL    EKG  EKG Interpretation None       Radiology Ct Soft Tissue Neck W Contrast  Result Date: 02/02/2017 CLINICAL DATA:  70 year old female with sore throat and difficulty swallowing for the past 4 days. Stridor. EXAM: CT NECK WITH CONTRAST TECHNIQUE: Multidetector CT imaging of the neck was performed using the standard protocol following the bolus administration of intravenous contrast. CONTRAST:  49mL ISOVUE-300 IOPAMIDOL (ISOVUE-300) INJECTION 61% COMPARISON:  Chest CTA 08/13/2014. FINDINGS: Pharynx and larynx: The left piriform sinus is effaced, but other  laryngeal contours are within normal limits. There is no evidence of epiglottic enlargement ; the epiglottis is closely applied to the tongue base as seen on series 7, image 39. There is a partially retropharyngeal course of both carotid arteries with some associated effacement of the posterior pharynx. There is an indistinct area of asymmetric hyper enhancement and effacement at the confluence of the left soft palate and lateral pharyngeal wall a cane on series 3, image 37. Hyperenhancement year tracks toward midline. The area is indistinct but encompasses about 19 mm. Pharyngeal soft tissue contours are otherwise within normal limits. Parapharyngeal and retropharyngeal spaces appear normal. Salivary glands: Negative sublingual space. There is prominence of bilateral submandibular ducts both internal and external to the submandibular glands, but no sialolithiasis or obstructing etiology identified. No submandibular gland inflammation. Bilateral parotid glands appear within normal limits. Thyroid: Mild thyroid enlargement. subcentimeter hypodense nodule on the right does not meet consensus criteria for ultrasound follow-up. Lymph nodes: No cervical lymphadenopathy. No heterogeneous or enlarged left side cervical lymph nodes are identified.  Small bilateral nodes appear symmetric in the neck. Vascular: Major vascular structures in the neck and at the skullbase are patent. Retropharyngeal course of both carotids. Limited intracranial: Negative. Visualized orbits: Postoperative changes to both globes, otherwise negative. Mastoids and visualized paranasal sinuses: Clear. Skeleton: Bulky degenerative osteophytosis in the lower cervical spine. No acute osseous abnormality identified. Upper chest: The subglottic airway appears stable to the 2016 chest CTA and normal. Normal visualized bilateral lung parenchyma. No superior mediastinal lymphadenopathy. IMPRESSION: 1. Indistinct 2 cm area of mass-like mucosal hyper enhancement at the junction of the left soft palate and left oropharynx. See series 3, image 37. No associated parapharyngeal space inflammation and no associated lymphadenopathy. It is unclear whether this is an infectious or neoplastic process, and direct visualization is recommended. If the clinical course is equivocal then a repeat Neck CT with IV contrast in 4-6 weeks is recommended. 2. Otherwise negative CT appearance of the neck and upper chest. Normal epiglottis. No airway narrowing. Electronically Signed   By: Genevie Ann M.D.   On: 02/02/2017 11:29    Procedures Procedures (including critical care time)  Medications Ordered in ED Medications  acetaminophen (TYLENOL) tablet 650 mg (650 mg Oral Given 02/02/17 1136)  iopamidol (ISOVUE-300) 61 % injection (75 mLs  Contrast Given 02/02/17 1103)     Initial Impression / Assessment and Plan / ED Course  I have reviewed the triage vital signs and the nursing notes.  Pertinent labs & imaging results that were available during my care of the patient were reviewed by me and considered in my medical decision making (see chart for details).     Final Clinical Impressions(s) / ED Diagnoses   Final diagnoses:  Pharyngitis, unspecified etiology    Labs: CBC, BMP  Imaging: CT neck  soft tissue  Consults: ENT Dr. Constance Holster  Therapeutics: Tylenol  Discharge Meds: Acyclovir, Augmentin, Ultram  Assessment/Plan: 71 year old female presents today with complaints of sore throat and earache.  Patient's exam significant for pharyngitis with swelling in the left upper oropharynx.  CT scan shows 2 cm mass in this region.  Patient does have what appears to be a vesicle on the soft palate, question viral etiology versus bacterial.  Patient will be started on acyclovir, Augmentin, pain medication.  Case was discussed with ENT Dr. Constance Holster who agrees outpatient follow-up would be appropriate in this patient.  Patient nontoxic, no signs of fever or leukocytosis.  Patient will follow-up as an  outpatient come back to the emergency room if any new or worsening signs or symptoms present.  She verbalized understanding and agreement to today's plan and had no further questions or concerns at the time of discharge.    ED Discharge Orders        Ordered    acyclovir (ZOVIRAX) 400 MG tablet  4 times daily     02/02/17 1203    amoxicillin-clavulanate (AUGMENTIN) 875-125 MG tablet  Every 12 hours     02/02/17 1203    traMADol (ULTRAM) 50 MG tablet  Every 6 hours PRN     02/02/17 1207       Okey Regal, PA-C 02/02/17 1227    Carmin Muskrat, MD 02/04/17 [phone]

## 2017-02-02 NOTE — Discharge Instructions (Signed)
Please read attached information. If you experience any new or worsening signs or symptoms please return to the emergency room for evaluation. Please follow-up with your primary care provider or specialist as discussed. Please use medication prescribed only as directed and discontinue taking if you have any concerning signs or symptoms.   °

## 2017-02-02 NOTE — ED Triage Notes (Signed)
Pt states several days of runny nose, post nasal drip and left ear pain. Also has a sore throat with painful swallowing. Afebrile.

## 2017-02-02 NOTE — ED Notes (Signed)
Patient able to ambulate independently  

## 2017-02-04 LAB — CULTURE, GROUP A STREP (THRC)

## 2017-03-12 NOTE — Pre-Procedure Instructions (Signed)
[name]  03/12/2017      [id number] [name], [place] [address] Phone: [phone] Fax: [phone]    Your procedure is scheduled on January 25  Report to [facility] at [place].M.  Call this number if you have problems the morning of surgery:  [phone]   Remember:  Do not eat food or drink liquids after midnight.  Continue all medications as directed by your physician except follow these medication instructions before surgery below   Take these medicines the morning of surgery with A SIP OF WATER NONE    Do not wear jewelry, make-up or nail polish.  Do not wear lotions, powders, or perfumes, or deodorant.  Do not shave 48 hours prior to surgery.    Do not bring valuables to the hospital.  [facility] is not responsible for any belongings or valuables.  Contacts, dentures or bridgework may not be worn into surgery.  Leave your suitcase in the car.  After surgery it may be brought to your room.  For patients admitted to the hospital, discharge time will be determined by your treatment team.  Patients discharged the day of surgery will not be allowed to drive home.    Special instructions:  . [place]- Preparing For Surgery  Before surgery, you can play an important role. Because skin is not sterile, your skin needs to be as free of germs as possible. You can reduce the number of germs on your skin by washing with CHG (chlorahexidine gluconate) Soap before surgery.  CHG is an antiseptic cleaner which kills germs and bonds with the skin to continue killing germs even after washing.  Please do not use if you have an allergy to CHG or antibacterial soaps. If your skin becomes reddened/irritated stop using the CHG.  Do not shave (including legs and underarms) for at least 48 hours prior to first CHG shower. It is OK to shave your face.  Please follow these instructions  carefully.   1. Shower the NIGHT BEFORE SURGERY and the MORNING OF SURGERY with CHG.   2. If you chose to wash your hair, wash your hair first as usual with your normal shampoo.  3. After you shampoo, rinse your hair and body thoroughly to remove the shampoo.  4. Use CHG as you would any other liquid soap. You can apply CHG directly to the skin and wash gently with a scrungie or a clean washcloth.   5. Apply the CHG Soap to your body ONLY FROM THE NECK DOWN.  Do not use on open wounds or open sores. Avoid contact with your eyes, ears, mouth and genitals (private parts). Wash Face and genitals (private parts)  with your normal soap.  6. Wash thoroughly, paying special attention to the area where your surgery will be performed.  7. Thoroughly rinse your body with warm water from the neck down.  8. DO NOT shower/wash with your normal soap after using and rinsing off the CHG Soap.  9. Pat yourself dry with a CLEAN TOWEL.  10. Wear CLEAN PAJAMAS to bed the night before surgery, wear comfortable clothes the morning of surgery  11. Place CLEAN SHEETS on your bed the night of your first shower and DO NOT SLEEP WITH PETS.    Day of Surgery: Do not apply any deodorants/lotions. Please wear clean clothes to the hospital/surgery center.      Please read  over the following fact sheets that you were given.

## 2017-03-13 ENCOUNTER — Encounter (HOSPITAL_COMMUNITY)
Admission: RE | Admit: 2017-03-13 | Discharge: 2017-03-13 | Disposition: A | Discharge status: Home / Self Care | Payer: Medicare Other | Source: Ambulatory Visit | Attending: Otolaryngology | Admitting: Otolaryngology

## 2017-03-13 ENCOUNTER — Other Ambulatory Visit: Payer: Self-pay

## 2017-03-13 ENCOUNTER — Encounter (HOSPITAL_COMMUNITY): Payer: Self-pay

## 2017-03-13 DIAGNOSIS — Z01812 Encounter for preprocedural laboratory examination: Secondary | ICD-10-CM | POA: Diagnosis not present

## 2017-03-13 DIAGNOSIS — J359 Chronic disease of tonsils and adenoids, unspecified: Secondary | ICD-10-CM | POA: Diagnosis not present

## 2017-03-13 DIAGNOSIS — Z01810 Encounter for preprocedural cardiovascular examination: Secondary | ICD-10-CM | POA: Diagnosis not present

## 2017-03-13 DIAGNOSIS — Z96659 Presence of unspecified artificial knee joint: Secondary | ICD-10-CM | POA: Diagnosis not present

## 2017-03-13 LAB — CBC
HCT: 38.4 % (ref 36.0–46.0)
Hemoglobin: 12.8 g/dL (ref 12.0–15.0)
MCH: 28.7 pg (ref 26.0–34.0)
MCHC: 33.3 g/dL (ref 30.0–36.0)
MCV: 86.1 fL (ref 78.0–100.0)
Platelets: 267 10*3/uL (ref 150–400)
RBC: 4.46 MIL/uL (ref 3.87–5.11)
RDW: 14.1 % (ref 11.5–15.5)
WBC: 4.1 10*3/uL (ref 4.0–10.5)

## 2017-03-13 LAB — BASIC METABOLIC PANEL
Anion gap: 12 (ref 5–15)
BUN: 11 mg/dL (ref 6–20)
CO2: 18 mmol/L — ABNORMAL LOW (ref 22–32)
Calcium: 9.2 mg/dL (ref 8.9–10.3)
Chloride: 105 mmol/L (ref 101–111)
Creatinine, Ser: 0.66 mg/dL (ref 0.44–1.00)
GFR calc Af Amer: >60 mL/min (ref >60)
GFR calc non Af Amer: >60 mL/min (ref >60)
Glucose, Bld: 91 mg/dL (ref 65–99)
Potassium: 4 mmol/L (ref 3.5–5.1)
Sodium: 135 mmol/L (ref 135–145)

## 2017-03-13 LAB — SURGICAL PCR SCREEN
MRSA, PCR: NEGATIVE
Staphylococcus aureus: NEGATIVE

## 2017-03-13 NOTE — Progress Notes (Addendum)
PCP: Nolene Ebbs, MD  Cardiologist: pt denies  EKG: obtained today per anesthesia protocol, pt has history of HTN and bundle branch block  Stress test: pt denies ever  ECHO: pt denies ever  Cardiac Cath: pt denies ever  Chest x-ray: pt denies past year, no recent respiratory infections, complications

## 2017-03-14 ENCOUNTER — Ambulatory Visit (HOSPITAL_COMMUNITY): Payer: Medicare Other | Admitting: Certified Registered Nurse Anesthetist

## 2017-03-14 ENCOUNTER — Encounter (HOSPITAL_COMMUNITY)
Admission: RE | Disposition: A | Discharge status: Home / Self Care | Payer: Self-pay | Source: Ambulatory Visit | Attending: Otolaryngology

## 2017-03-14 ENCOUNTER — Encounter (HOSPITAL_COMMUNITY): Payer: Self-pay | Admitting: Urology

## 2017-03-14 ENCOUNTER — Ambulatory Visit (HOSPITAL_COMMUNITY)
Admission: RE | Admit: 2017-03-14 | Discharge: 2017-03-14 | Disposition: A | Discharge status: Home / Self Care | Payer: Medicare Other | Source: Ambulatory Visit | Attending: Otolaryngology | Admitting: Otolaryngology

## 2017-03-14 DIAGNOSIS — J359 Chronic disease of tonsils and adenoids, unspecified: Secondary | ICD-10-CM | POA: Diagnosis not present

## 2017-03-14 DIAGNOSIS — Z01812 Encounter for preprocedural laboratory examination: Secondary | ICD-10-CM | POA: Insufficient documentation

## 2017-03-14 DIAGNOSIS — Z96659 Presence of unspecified artificial knee joint: Secondary | ICD-10-CM | POA: Insufficient documentation

## 2017-03-14 DIAGNOSIS — Z01810 Encounter for preprocedural cardiovascular examination: Secondary | ICD-10-CM | POA: Insufficient documentation

## 2017-03-14 HISTORY — PX: LARYNGOSCOPY: SHX5203

## 2017-03-14 SURGERY — LARYNGOSCOPY
Anesthesia: General | Site: Throat | Laterality: Left

## 2017-03-14 MED ORDER — ROCURONIUM BROMIDE 10 MG/ML (PF) SYRINGE
PREFILLED_SYRINGE | INTRAVENOUS | Status: AC
  Filled 2017-03-14: qty 5

## 2017-03-14 MED ORDER — LIDOCAINE 2% (20 MG/ML) 5 ML SYRINGE
INTRAMUSCULAR | Status: DC | PRN
  Administered 2017-03-14: 100 mg via INTRAVENOUS

## 2017-03-14 MED ORDER — LIDOCAINE 2% (20 MG/ML) 5 ML SYRINGE
INTRAMUSCULAR | Status: AC
  Filled 2017-03-14: qty 5

## 2017-03-14 MED ORDER — DEXAMETHASONE SODIUM PHOSPHATE 10 MG/ML IJ SOLN
INTRAMUSCULAR | Status: AC
  Filled 2017-03-14: qty 1

## 2017-03-14 MED ORDER — SUCCINYLCHOLINE CHLORIDE 200 MG/10ML IV SOSY
PREFILLED_SYRINGE | INTRAVENOUS | Status: AC
  Filled 2017-03-14: qty 10

## 2017-03-14 MED ORDER — FENTANYL CITRATE (PF) 100 MCG/2ML IJ SOLN
INTRAMUSCULAR | Status: DC | PRN
  Administered 2017-03-14: 100 ug via INTRAVENOUS
  Administered 2017-03-14: 50 ug via INTRAVENOUS

## 2017-03-14 MED ORDER — STERILE WATER FOR IRRIGATION IR SOLN
Status: DC | PRN
  Administered 2017-03-14: 1000 mL

## 2017-03-14 MED ORDER — 0.9 % SODIUM CHLORIDE (POUR BTL) OPTIME
TOPICAL | Status: DC | PRN
  Administered 2017-03-14: 1000 mL

## 2017-03-14 MED ORDER — FENTANYL CITRATE (PF) 100 MCG/2ML IJ SOLN: 25.0000 ug | INTRAMUSCULAR | Status: DC | PRN

## 2017-03-14 MED ORDER — ONDANSETRON HCL 4 MG/2ML IJ SOLN
INTRAMUSCULAR | Status: DC | PRN
  Administered 2017-03-14: 4 mg via INTRAVENOUS

## 2017-03-14 MED ORDER — DEXAMETHASONE SODIUM PHOSPHATE 10 MG/ML IJ SOLN
INTRAMUSCULAR | Status: DC | PRN
  Administered 2017-03-14: 10 mg via INTRAVENOUS

## 2017-03-14 MED ORDER — PROPOFOL 10 MG/ML IV BOLUS
INTRAVENOUS | Status: AC
  Filled 2017-03-14: qty 40

## 2017-03-14 MED ORDER — LACTATED RINGERS IV SOLN
INTRAVENOUS | Status: DC | PRN
  Administered 2017-03-14 (×2): via INTRAVENOUS

## 2017-03-14 MED ORDER — ONDANSETRON HCL 4 MG/2ML IJ SOLN
INTRAMUSCULAR | Status: AC
  Filled 2017-03-14: qty 2

## 2017-03-14 MED ORDER — EPINEPHRINE HCL (NASAL) 0.1 % NA SOLN
NASAL | Status: AC
  Filled 2017-03-14: qty 30

## 2017-03-14 MED ORDER — FENTANYL CITRATE (PF) 250 MCG/5ML IJ SOLN
INTRAMUSCULAR | Status: AC
  Filled 2017-03-14: qty 5

## 2017-03-14 MED ORDER — SUCCINYLCHOLINE CHLORIDE 200 MG/10ML IV SOSY
PREFILLED_SYRINGE | INTRAVENOUS | Status: DC | PRN
  Administered 2017-03-14: 120 mg via INTRAVENOUS

## 2017-03-14 MED ORDER — PROPOFOL 10 MG/ML IV BOLUS
INTRAVENOUS | Status: DC | PRN
  Administered 2017-03-14: 200 mg via INTRAVENOUS

## 2017-03-14 MED ORDER — EPINEPHRINE 30 MG/30ML IJ SOLN
INTRAMUSCULAR | Status: DC | PRN
  Administered 2017-03-14: 1 mg

## 2017-03-14 SURGICAL SUPPLY — 25 items
CANISTER SUCT 3000ML PPV (MISCELLANEOUS) ×2 IMPLANT
CONT SPEC 4OZ CLIKSEAL STRL BL (MISCELLANEOUS) ×1 IMPLANT
COVER BACK TABLE 60X90IN (DRAPES) ×2 IMPLANT
COVER MAYO STAND STRL (DRAPES) ×2 IMPLANT
DRAPE HALF SHEET 40X57 (DRAPES) ×2 IMPLANT
GAUZE SPONGE 4X4 16PLY XRAY LF (GAUZE/BANDAGES/DRESSINGS) ×2 IMPLANT
GLOVE BIO SURGEON STRL SZ 6.5 (GLOVE) ×2 IMPLANT
GLOVE BIO SURGEON STRL SZ7 (GLOVE) ×1 IMPLANT
GLOVE BIOGEL PI IND STRL 7.0 (GLOVE) IMPLANT
GLOVE BIOGEL PI IND STRL 7.5 (GLOVE) IMPLANT
GLOVE BIOGEL PI INDICATOR 7.0 (GLOVE) ×1
GLOVE BIOGEL PI INDICATOR 7.5 (GLOVE) ×1
GLOVE SURG SS PI 7.0 STRL IVOR (GLOVE) ×2 IMPLANT
GOWN STRL REUS W/ TWL LRG LVL3 (GOWN DISPOSABLE) IMPLANT
GOWN STRL REUS W/TWL LRG LVL3 (GOWN DISPOSABLE) ×4
GUARD TEETH (MISCELLANEOUS) ×1 IMPLANT
KIT BASIN OR (CUSTOM PROCEDURE TRAY) ×2 IMPLANT
NS IRRIG 1000ML POUR BTL (IV SOLUTION) ×2 IMPLANT
PAD ARMBOARD 7.5X6 YLW CONV (MISCELLANEOUS) ×4 IMPLANT
PATTIES SURGICAL .5X1.5 (GAUZE/BANDAGES/DRESSINGS) ×2 IMPLANT
SOLUTION ANTI FOG 6CC (MISCELLANEOUS) ×1 IMPLANT
SYR 20CC LL (SYRINGE) ×1 IMPLANT
TOWEL NATURAL 6PK STERILE (DISPOSABLE) ×2 IMPLANT
TUBE CONNECTING 12X1/4 (SUCTIONS) ×2 IMPLANT
WATER STERILE IRR 1000ML POUR (IV SOLUTION) ×2 IMPLANT

## 2017-03-14 NOTE — Anesthesia Procedure Notes (Signed)
Procedure Name: Intubation Date/Time: 03/14/2017 7:38 AM Performed by: Genelle Bal, CRNA Pre-anesthesia Checklist: Patient identified, Emergency Drugs available, Suction available and Patient being monitored Patient Re-evaluated:Patient Re-evaluated prior to induction Oxygen Delivery Method: Circle system utilized Preoxygenation: Pre-oxygenation with 100% oxygen Induction Type: IV induction Ventilation: Mask ventilation without difficulty Laryngoscope Size: Glidescope and 4 Grade View: Grade I Tube type: Oral Tube size: 6.0 mm Number of attempts: 1 Airway Equipment and Method: Stylet and Oral airway Placement Confirmation: ETT inserted through vocal cords under direct vision,  positive ETCO2 and breath sounds checked- equal and bilateral Secured at: 22 cm Tube secured with: Tape Dental Injury: Teeth and Oropharynx as per pre-operative assessment  Future Recommendations: Recommend- induction with short-acting agent, and alternative techniques readily available

## 2017-03-14 NOTE — Op Note (Signed)
DATE OF PROCEDURE:  03/14/2017    PRE-OPERATIVE DIAGNOSIS:  LEFT PERITONSILOR MASS    POST-OPERATIVE DIAGNOSIS:  Same    PROCEDURE(S):  Direct laryngoscopy with operating telescope with biopsies   SURGEON:  Gavin Pound, MD    ASSISTANT(S):  none   ANESTHESIA:  General endotracheal anesthesia     ESTIMATED BLOOD LOSS:  30mL   SPECIMENS:  Left tonsillar mass    COMPLICATIONS:  None    OPERATIVE FINDINGS: Patient was a moderately easy mask with oropharyngeal airway. Exposure of larynx was moderately difficult for anesthesia and was completed with Glidescope. She was rather anterior and also has small mouth opening, large teeth, and somewhat limited neck extension.  The left tonsil region appeared to have a very non-discrete tonsillar mass, which was firm, mobile and did seem to have some minor extension into left base of tongue.  Large base of tongue, anterior glottis, and prominent false folds made her laryngeal exposure somewhat difficult. There were no laryngeal lesions, valleculae lesions or involvement of the epiglottis.     OPERATIVE DETAILS:  After being properly identified in the preoperative holding area, the patient was brought into the operating suite.  She was placed supine on the operating table.  A pre-procedural time-out was performed. Pre-operative steroids were administered.  After induction of general anesthesia, the patient was easily bag-mask ventilated with an oral airway by anesthesia. Intubation was performed by anesthesia with a glidescope.  Eye-pads, eye-tape, and a tooth guard were placed. Paralytics were then administered. The mouth and oropharynx were palpated with findings as noted above. The Dedo laryngoscope was brought to the field and the larynx exposed. Photodocumentation of the area of concern was obtained. Biopsies were taken in the left tonsil bed and left retromolar trigone and at base of tongue. Hemostasis was  achieved with afrin-soaked pledgets.  The patient was released from suspension and the vocal folds were sprayed with 4% lidocaine. The hypopharynx was suctioned free of all accumulated secretions. The patient was then mask ventilated until breathing spontaneously. She was transferred stable to the recovery room having tolerated the procedure well. At the end of the case, the instrument, sponge and needle counts were correct. The patient tolerated the procedure well.

## 2017-03-14 NOTE — Anesthesia Preprocedure Evaluation (Signed)
Anesthesia Evaluation  Patient identified by MRN, date of birth, ID band Patient awake    Reviewed: Allergy & Precautions, NPO status , Patient's Chart, lab work & pertinent test results  History of Anesthesia Complications (+) PSEUDOCHOLINESTERASE DEFICIENCY  Airway Mallampati: II  TM Distance: >3 FB     Dental   Pulmonary former smoker,    breath sounds clear to auscultation       Cardiovascular hypertension,  Rhythm:Regular Rate:Normal     Neuro/Psych    GI/Hepatic Neg liver ROS, GERD  ,  Endo/Other  negative endocrine ROS  Renal/GU negative Renal ROS     Musculoskeletal   Abdominal   Peds  Hematology   Anesthesia Other Findings   Reproductive/Obstetrics                             Anesthesia Physical Anesthesia Plan  ASA: III  Anesthesia Plan: General   Post-op Pain Management:    Induction: Intravenous  PONV Risk Score and Plan: 3 and Treatment may vary due to age or medical condition  Airway Management Planned:   Additional Equipment:   Intra-op Plan:   Post-operative Plan: Possible Post-op intubation/ventilation  Informed Consent: I have reviewed the patients History and Physical, chart, labs and discussed the procedure including the risks, benefits and alternatives for the proposed anesthesia with the patient or authorized representative who has indicated his/her understanding and acceptance.   Dental advisory given  Plan Discussed with: CRNA and Anesthesiologist  Anesthesia Plan Comments:         Anesthesia Quick Evaluation

## 2017-03-14 NOTE — Discharge Instructions (Signed)
-  Soft diet for the next few days will be easier, but any consistency of food is "safe" for you to try to eat. Avoid spicy or acidic foods until you are better healed.  -Small amount of bleeding is normal. You may find it helpful to sleep in a recliner or elevated for a night or two.  -Drink lots of liquids, stay hydrated.  -Rinse your mouth out with warm salt water 3 x daily after meals for the next 5 days -Alternate taking tylenol and ibuprofen as needed for pain -Sore jaw, neck, lips, tongue are all normal and should improve.  -Call us or go to the ER if you cannot swallow, cannot stay hydrated, or have any difficulty breathing.  -Dr. Blenda Nicely will call you with your biopsy results and a followup plan. Results usually take about 5 days.

## 2017-03-14 NOTE — H&P (Signed)
The surgical history remains accurate and without interval change. The condition still exists which makes the procedure necessary. The patient and/or family is aware of their condition and has been informed of the risks and benefits of surgery, as well as alternatives. All parties have elected to proceed with surgery.   Surgical plan: DL with biopsies in the OR   Otolaryngology New Patient Note  Subjective: Ms. Jamesa Tedrick is a 72 y.o. female kindly referred by Self, A Referral for evaluation of left throat pain and otalgia. Symptoms began acutely approximately 5 days ago. She noted left otalgia first followed shortly thereafter by significant left throat pain. There was odynophagia associated with this. She reports that she also began to have significant malaise. The next day she was seen in the [facility] emergency department. CT of her neck was done which demonstrated a 2 cm masslike enhancement in the left peritonsillar region. She has had her tonsils removed in the past. At this time her white blood count was normal and she was afebrile. She is not a smoker and has never been a tobacco user. She did l note a temporary voice change for couple's which resolved 2 days ago. She reports that she was placed on Augmentin, acyclovir and pain medication. She reports that she has significant improvement and is 80% better since being on these medications. Anything like this in the past. Feels no neck masses, no trouble swallowing, no weight loss.  History reviewed. No pertinent past medical history. Past Surgical History:  Procedure Laterality Date  . CHOLECYSTECTOMY  . KNEE REPLACEMENT UNICOMPARTMENTAL   Family History  Problem Relation Age of Onset  . Rheum arthritis Mother  . Dementia Father   Social History   Social History  . Marital status: N/A  Spouse name: N/A  . Number of children: N/A  . Years of education: N/A   Occupational History  . Not on file.   Social History Main Topics   . Smoking status: Never Smoker  . Smokeless tobacco: Never Used  . Alcohol use 5.0 oz/week  1 Glasses of wine (5 oz./glass) per week  Comment: social  . Drug use: No  . Sexual activity: Not on file   Other Topics Concern  . Not on file   Social History Narrative  . No narrative on file   Not on File Updated Medication List:   acyclovir (ZOVIRAX) 400 MG tablet  Sig: TAKE 1 TABLET BY MOUTH 4 TIMES A DAY  Class: Historical Med  amoxicillin-clavulanate (AUGMENTIN) 875-125 mg per tablet  Sig: TAKE 1 TABLET BY MOUTH EVERY 12 HOURS  Class: Historical Med  traMADol (ULTRAM) 50 mg tablet  Sig - Route: Take 50 mg by mouth. - Oral  Class: Historical Med    ROS A 12-pt review of systems was conducted and was negative except as stated in the HPI.   Objective:  Vitals:   03/14/17 0613  BP: 139/72  Pulse: 78  Resp: 20  Temp: 97.6 F (36.4 C)  SpO2: 100%     Physical Exam:  General Normocephalic, Awake, Alert and appropriate for the exam Obese  Eyes PERRL, no scleral icterus or conjunctival hemorrhage.  EOMI.  Ears Right ear- EAC patent, no obstructing cerumen. TM: intact, no effusion, no retraction, normal landmarks Left ear- EAC patent, no obstructing cerumen. TM: intact, no effusion, no retraction, normal landmarks  Nose Patent, No polyps or masses seen.  Oral Pharynx very large base of tongue. In the left tonsillar region there  is a somewhat exophytic almost pedunculated appearing lesion in the left retromolar trigone. I cannot palpate this. Patient is very resistant to oral examination. She reports she has a very strong gag reflex. Dentition is poor. Multiple missing teeth on maxillary and mandibular molars. Multiple mandibular incisors are loose with exposed root. There is no trismus.  Lymphatics No cervical lymphadenopathy or masses on palpation  Endocrine No thyroidmegaly, no thyroid masses palpated  Cardio-vascular No cyanosis, regular rate  Pulmonary No audible  stridor, Breathing easily with no labor. No dysphonia.  Neuro Symmetric facial movement.  Tongue protrudes in midline.  Psychiatry Appropriate affect and mood for clinic visit.  Skin No scars or lesions on face or neck.    ED 02/02/17 visit records reviewed- Augmentin, acyclovir, pain mediation prescribed.   CT neck 02/02/17- personally reviewed- irreguarly shaped region of enhancement in the peritonsillar space. Patient had no elevated WBC or fever at time of this study.   Read states "mass-like 1.66mm mucosal enhancement"   Assessment:  My impression is that Everlean has  1. Tonsillar mass     Plan:  1. We will plan to go to the operating room for direct laryngoscopy and biopsy. We will plan on doing this at [facility]

## 2017-03-14 NOTE — Anesthesia Postprocedure Evaluation (Signed)
Anesthesia Post Note  Patient: [name]  Procedure(s) Performed: LARYNGOSCOPY (Left Throat)     Patient location during evaluation: PACU Anesthesia Type: General Level of consciousness: awake Pain management: pain level controlled Respiratory status: spontaneous breathing Cardiovascular status: stable Anesthetic complications: no    Last Vitals:  Vitals:   03/14/17 0908 03/14/17 0909  BP: 139/85 123/76  Pulse: 70   Resp: 12   Temp: 36.4 C   SpO2: 95%     Last Pain:  Vitals:   03/14/17 0908  TempSrc: Oral  PainSc: 2                  Natally Ribera

## 2017-03-14 NOTE — Transfer of Care (Signed)
Immediate Anesthesia Transfer of Care Note  Patient: [name]  Procedure(s) Performed: LARYNGOSCOPY (Left Throat)  Patient Location: PACU  Anesthesia Type:General  Level of Consciousness: awake, alert  and oriented  Airway & Oxygen Therapy: Patient Spontanous Breathing and Patient connected to face mask oxygen  Post-op Assessment: Report given to RN and Post -op Vital signs reviewed and stable  Post vital signs: Reviewed and stable  Last Vitals:  Vitals:   03/14/17 0613  BP: 139/72  Pulse: 78  Resp: 20  Temp: 36.4 C  SpO2: 100%    Last Pain:  Vitals:   03/14/17 0613  TempSrc: Oral         Complications: No apparent anesthesia complications

## 2017-03-15 ENCOUNTER — Encounter (HOSPITAL_COMMUNITY): Payer: Self-pay | Admitting: Otolaryngology

## 2017-12-07 ENCOUNTER — Emergency Department (HOSPITAL_BASED_OUTPATIENT_CLINIC_OR_DEPARTMENT_OTHER): Payer: Medicare Other

## 2017-12-07 ENCOUNTER — Emergency Department (HOSPITAL_COMMUNITY): Payer: Medicare Other

## 2017-12-07 ENCOUNTER — Emergency Department (HOSPITAL_COMMUNITY)
Admission: EM | Admit: 2017-12-07 | Discharge: 2017-12-07 | Disposition: A | Discharge status: Home / Self Care | Payer: Medicare Other | Attending: Emergency Medicine | Admitting: Emergency Medicine

## 2017-12-07 ENCOUNTER — Ambulatory Visit (HOSPITAL_COMMUNITY)
Admission: EM | Admit: 2017-12-07 | Discharge: 2017-12-07 | Disposition: A | Discharge status: Nursing Home (Includes ICF) | Payer: Medicare Other | Source: Home / Self Care

## 2017-12-07 ENCOUNTER — Encounter (HOSPITAL_COMMUNITY): Payer: Self-pay | Admitting: Emergency Medicine

## 2017-12-07 DIAGNOSIS — I82412 Acute embolism and thrombosis of left femoral vein: Secondary | ICD-10-CM

## 2017-12-07 DIAGNOSIS — I82492 Acute embolism and thrombosis of other specified deep vein of left lower extremity: Secondary | ICD-10-CM | POA: Insufficient documentation

## 2017-12-07 DIAGNOSIS — I10 Essential (primary) hypertension: Secondary | ICD-10-CM | POA: Diagnosis not present

## 2017-12-07 DIAGNOSIS — Z79899 Other long term (current) drug therapy: Secondary | ICD-10-CM | POA: Diagnosis not present

## 2017-12-07 DIAGNOSIS — R609 Edema, unspecified: Secondary | ICD-10-CM | POA: Diagnosis not present

## 2017-12-07 DIAGNOSIS — R2242 Localized swelling, mass and lump, left lower limb: Secondary | ICD-10-CM | POA: Diagnosis present

## 2017-12-07 DIAGNOSIS — Z87891 Personal history of nicotine dependence: Secondary | ICD-10-CM | POA: Diagnosis not present

## 2017-12-07 DIAGNOSIS — R0789 Other chest pain: Secondary | ICD-10-CM | POA: Insufficient documentation

## 2017-12-07 LAB — BASIC METABOLIC PANEL
Anion gap: 8 (ref 5–15)
BUN: 6 mg/dL — ABNORMAL LOW (ref 8–23)
CO2: 24 mmol/L (ref 22–32)
Calcium: 9.4 mg/dL (ref 8.9–10.3)
Chloride: 105 mmol/L (ref 98–111)
Creatinine, Ser: 0.7 mg/dL (ref 0.44–1.00)
GFR calc Af Amer: >60 mL/min (ref >60)
GFR calc non Af Amer: >60 mL/min (ref >60)
Glucose, Bld: 91 mg/dL (ref 70–99)
Potassium: 3.6 mmol/L (ref 3.5–5.1)
Sodium: 137 mmol/L (ref 135–145)

## 2017-12-07 LAB — I-STAT TROPONIN, ED
Troponin i, poc: 0 ng/mL (ref 0.00–0.08)
Troponin i, poc: 0 ng/mL (ref 0.00–0.08)

## 2017-12-07 LAB — CBC
HCT: 35.6 % — ABNORMAL LOW (ref 36.0–46.0)
Hemoglobin: 11.3 g/dL — ABNORMAL LOW (ref 12.0–15.0)
MCH: 28.2 pg (ref 26.0–34.0)
MCHC: 31.7 g/dL (ref 30.0–36.0)
MCV: 88.8 fL (ref 80.0–100.0)
Platelets: 268 10*3/uL (ref 150–400)
RBC: 4.01 MIL/uL (ref 3.87–5.11)
RDW: 13.2 % (ref 11.5–15.5)
WBC: 5 10*3/uL (ref 4.0–10.5)
nRBC: 0 % (ref 0.0–0.2)

## 2017-12-07 MED ORDER — IOPAMIDOL (ISOVUE-370) INJECTION 76%
100.0000 mL | Freq: Once | INTRAVENOUS | Status: AC | PRN
  Administered 2017-12-07: 100 mL via INTRAVENOUS

## 2017-12-07 MED ORDER — RIVAROXABAN 15 MG PO TABS
15.0000 mg | ORAL_TABLET | Freq: Once | ORAL | Status: AC
  Administered 2017-12-07: 15 mg via ORAL
  Filled 2017-12-07: qty 1

## 2017-12-07 MED ORDER — RIVAROXABAN (XARELTO) VTE STARTER PACK (15 & 20 MG): ORAL_TABLET | ORAL | 0 refills | Status: DC

## 2017-12-07 MED ORDER — IOPAMIDOL (ISOVUE-370) INJECTION 76%
INTRAVENOUS | Status: AC
  Filled 2017-12-07: qty 100

## 2017-12-07 MED ORDER — RIVAROXABAN (XARELTO) EDUCATION KIT FOR DVT/PE PATIENTS
PACK | Freq: Once | Status: AC
  Administered 2017-12-07: 20:00:00
  Filled 2017-12-07: qty 1

## 2017-12-07 NOTE — ED Provider Notes (Signed)
Patient placed in Quick Look pathway, seen and evaluated   Chief Complaint: Left leg swelling  HPI:   Endorses left leg swelling and pain extending from the left inner thigh down into the calf and the foot for the last several weeks.   She has some edema in the right lower extremity, but this has been stable. She endorses right-sided chest pain, intermittent, occurs at rest or with exertion, lasts for less than a minute or two, minor, aching.   Denies numbness, shortness of breath, cough, fever, diaphoresis, acute nausea, vomiting, diarrhea.  ROS: Left leg swelling and chest pain  Physical Exam:   Gen: No distress  Neuro: Awake and Alert  Skin: Warm    Focused Exam:   No diaphoresis.  No pallor.  Pulmonary: No increased work of breathing.  Speaks in full sentences without difficulty.  Lung sounds clear.  No tachypnea.  Cardiac: No tachycardia.  Peripheral pulses intact.  Neurologic: Sensation to light touch grossly intact in the bilateral lower extremities.  Motor function intact.  MSK: Edema in the bilateral lower extremities, left significantly worse than the right.  Tenderness extends from the left medial thigh into the left calf and the left foot.   Initiation of care has begun. The patient has been counseled on the process, plan, and necessity for staying for the completion/evaluation, and the remainder of the medical screening examination   Layla Maw 12/07/17 1436    Blanchie Dessert, MD 12/07/17 2120

## 2017-12-07 NOTE — ED Notes (Signed)
Patient transported to X-ray 

## 2017-12-07 NOTE — Discharge Instructions (Signed)
Return for chest pain, shortness of breath or if you pass out.  This medicine thins your blood and can make you bleed more easily.  If you notice that your stool turns dark and tarry or bright red please see you family doctor or come to the ED.   Information on my medicine - XARELTO (rivaroxaban)  This medication education was reviewed with me or my healthcare representative as part of my discharge preparation.  The pharmacist that spoke with me during my hospital stay was:  Lawson Radar, Iselin? Xarelto was prescribed to treat blood clots that may have been found in the veins of your legs (deep vein thrombosis) or in your lungs (pulmonary embolism) and to reduce the risk of them occurring again.  What do you need to know about Xarelto? The starting dose is one 15 mg tablet taken TWICE daily with food for the FIRST 21 DAYS then on 12/28/17  the dose is changed to one 20 mg tablet taken ONCE A DAY with your evening meal.  DO NOT stop taking Xarelto without talking to the health care provider who prescribed the medication.  Refill your prescription for 20 mg tablets before you run out.  After discharge, you should have regular check-up appointments with your healthcare provider that is prescribing your Xarelto.  In the future your dose may need to be changed if your kidney function changes by a significant amount.  What do you do if you miss a dose? If you are taking Xarelto TWICE DAILY and you miss a dose, take it as soon as you remember. You may take two 15 mg tablets (total 30 mg) at the same time then resume your regularly scheduled 15 mg twice daily the next day.  If you are taking Xarelto ONCE DAILY and you miss a dose, take it as soon as you remember on the same day then continue your regularly scheduled once daily regimen the next day. Do not take two doses of Xarelto at the same time.   Important Safety Information Xarelto is a blood  thinner medicine that can cause bleeding. You should call your healthcare provider right away if you experience any of the following: ? Bleeding from an injury or your nose that does not stop. ? Unusual colored urine (red or dark brown) or unusual colored stools (red or black). ? Unusual bruising for unknown reasons. ? A serious fall or if you hit your head (even if there is no bleeding).  Some medicines may interact with Xarelto and might increase your risk of bleeding while on Xarelto. To help avoid this, consult your healthcare provider or pharmacist prior to using any new prescription or non-prescription medications, including herbals, vitamins, non-steroidal anti-inflammatory drugs (NSAIDs) and supplements.  This website has more information on Xarelto: [email or web address].[email or web address]

## 2017-12-07 NOTE — ED Notes (Signed)
PA shawn joy in to assess patient at this time

## 2017-12-07 NOTE — ED Triage Notes (Signed)
Pt reports left leg swelling, swelling present to thigh all the way down to foot for the past few weeks. Pt also reports some tingling in her leg and some mild R sided chest pain. Denies any recent trips or long car rides

## 2017-12-07 NOTE — ED Triage Notes (Signed)
The patient presented to the [facility] with a complaint of acute left leg selling from the ankle to mid-thigh. The patient denied any known injury. Patient was deferred to the [facility] ED for probable ultrasound per H. Weiters PA.

## 2017-12-07 NOTE — ED Provider Notes (Signed)
[facility] EMERGENCY DEPARTMENT Provider Note   CSN: [id number] Arrival date & time: 12/07/17  1411     History   Chief Complaint Chief Complaint  Patient presents with  . Leg Swelling  . Chest Pain    HPI [name] is a 72 y.o. female.  72 yo F with a chief complaint of left lower extremity edema.  Going on for the past 3 weeks.  Has not significantly improved and so came here today.  She is also on review of systems having some episodic right upper chest pain.  This lasts for about a second at a time and then resolve spontaneously.  Had 1 of these events while she was waiting in the waiting room.  Nothing seems to make those better or worse.  Nothing seems to bring them on.  She denies nausea vomiting diaphoresis or shortness of breath with these events.  She denies prior PE or DVT denies hemoptysis denies recent immobilization travel surgery or hospitalization.  She denies trauma to the leg.  Denies history of MI.  Denies history of hypertension hyperlipidemia diabetes smoking or family history.  The history is provided by the patient.  Chest Pain   This is a new problem. The current episode started more than 1 week ago. The problem occurs rarely. The problem has been resolved. The pain is present in the lateral region. The pain is at a severity of 3/10. The pain is moderate. The quality of the pain is described as brief and sharp. The pain does not radiate. Duration of episode(s) is 1 second. Pertinent negatives include no dizziness, no fever, no headaches, no nausea, no palpitations, no shortness of breath and no vomiting. She has tried nothing for the symptoms. The treatment provided no relief.  Pertinent negatives for past medical history include no diabetes, no DVT, no hyperlipidemia, no hypertension, no MI and no PE.  Pertinent negatives for family medical history include: no early MI.    Past Medical History:  Diagnosis Date  . Acute upper respiratory  infection 07/06/2014  . Anemia   . Arthritis    Back   . Colon polyp    Tubular Adenoma   . Cough productive of clear sputum 06/22/2014  . GERD (gastroesophageal reflux disease)   . History of right bundle branch block (RBBB)   . HOH (hard of hearing)   . Hypertension    had in the past, is no longer on medication for this and blood pressures are WNL  . 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 Primary localized osteoarthritis of right knee     Patient Active Problem List   Diagnosis Date Noted  . Acute upper respiratory infection 07/06/2014  . 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 DJD (degenerative joint disease) of knee 07/04/2014  . Primary localized osteoarthritis of right knee   . Arthritis   . Anemia   . GERD (gastroesophageal reflux disease)   . Colon polyp   . HOH (hard of hearing)   . Postoperative anemia due to acute blood loss 05/01/2011  . 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 ABDOMINAL HYSTERECTOMY  2012  . CHOLECYSTECTOMY N/A 03/09/2013   Procedure: LAPAROSCOPIC CHOLECYSTECTOMY;  Surgeon: Gayland Curry, MD;  Location: [facility];  Service: General;  Laterality: N/A;  . COLONOSCOPY W/ BIOPSIES    . LARYNGOSCOPY Left 03/14/2017   Procedure: LARYNGOSCOPY;  Surgeon: Helayne Seminole, MD;  Location: [facility];  Service: ENT;  Laterality: Left;  . TOTAL KNEE ARTHROPLASTY  04/29/2011   Procedure: TOTAL KNEE ARTHROPLASTY;  Surgeon: Lorn Junes, MD;  Location: [facility];  Service: Orthopedics;  Laterality: Left;  DR [place] THIS CASE  . TOTAL KNEE ARTHROPLASTY Right 07/04/2014   Procedure: TOTAL KNEE ARTHROPLASTY;  Surgeon: Elsie Saas, MD;  Location: [facility];  Service: Orthopedics;  Laterality: Right;     OB History   None      Home Medications    Prior to Admission medications   Medication Sig Start Date End Date Taking? Authorizing Provider  Olopatadine HCl 0.2 % SOLN Place 1 drop into  both eyes daily. 12/06/17  Yes [provider]  RESTASIS 0.05 % ophthalmic emulsion Place 1 drop into both eyes 2 (two) times daily. 10/01/17  Yes [provider]  acetaminophen (TYLENOL) 325 MG tablet Take 2 tablets (650 mg total) by mouth every 6 (six) hours as needed for mild pain (or Fever >/= 101). Patient not taking: Reported on 03/04/2017 07/06/14   Shepperson, Kirstin, PA-C  Rivaroxaban 15 & 20 MG TBPK Take as directed on package: Start with one 80m tablet by mouth twice a day with food. On Day 22, switch to one 273mtablet once a day with food. 12/07/17   FlDeno EtienneDO  traMADol (ULTRAM) 50 MG tablet Take 1 tablet (50 mg total) by mouth every 6 (six) hours as needed. Patient not taking: Reported on 03/04/2017 02/02/17   HeOkey RegalPA-C    Family History Family History  Problem Relation Age of Onset  . Arthritis Mother   . Hypertension Mother   . Alzheimer's disease Father   . Diabetes Sister   . Hypertension Sister   . Hypertension Brother   . Stroke Brother   . Hypertension Brother   . Hypertension Sister   . Hypertension Sister   . Hypertension Sister   . Breast cancer Other        Niece  . Anesthesia problems Neg Hx   . Hypotension Neg Hx   . Malignant hyperthermia Neg Hx   . Pseudochol deficiency Neg Hx   . Colon cancer Neg Hx     Social History Social History   Tobacco Use  . Smoking status: Former Smoker    Years: 1.00    Last attempt to quit: 04/22/1988    Years since quitting: 29.6  . Smokeless tobacco: Never Used  Substance Use Topics  . Alcohol use: No  . Drug use: No     Allergies   Patient has no known allergies.   Review of Systems Review of Systems  Constitutional: Negative for chills and fever.  HENT: Negative for congestion and rhinorrhea.   Eyes: Negative for redness and visual disturbance.  Respiratory: Negative for shortness of breath and wheezing.   Cardiovascular: Positive for leg swelling. Negative for chest  pain and palpitations.  Gastrointestinal: Negative for nausea and vomiting.  Genitourinary: Negative for dysuria and urgency.  Musculoskeletal: Negative for arthralgias and myalgias.  Skin: Negative for pallor and wound.  Neurological: Negative for dizziness and headaches.     Physical Exam Updated Vital Signs BP (!) 120/49   Pulse 88   Temp 98.7 F (37.1 C) (Oral)   Resp 19   Ht _0  (1.549 m)   Wt 110.2 kg   SpO2 98%   BMI 45.91 kg/m   Physical Exam  Constitutional: She is oriented to person, place,  and time. She appears well-developed and well-nourished. No distress.  HENT:  Head: Normocephalic and atraumatic.  Eyes: Pupils are equal, round, and reactive to light. EOM are normal.  Neck: Normal range of motion. Neck supple.  Cardiovascular: Normal rate and regular rhythm. Exam reveals no gallop and no friction rub.  No murmur heard. Pulmonary/Chest: Effort normal. She has no wheezes. She has no rales. She exhibits no tenderness.  Abdominal: Soft. She exhibits no distension. There is no tenderness.  Musculoskeletal: She exhibits edema (left greater than right). She exhibits no tenderness.  Left leg with erythema warm to touch compared to other leg.   Neurological: She is alert and oriented to person, place, and time.  Skin: Skin is warm and dry. She is not diaphoretic.  Psychiatric: She has a normal mood and affect. Her behavior is normal.  Nursing note and vitals reviewed.    ED Treatments / Results  Labs (all labs ordered are listed, but only abnormal results are displayed) Labs Reviewed  BASIC METABOLIC PANEL - Abnormal; Notable for the following components:      Result Value   BUN 6 (*)    All other components within normal limits  CBC - Abnormal; Notable for the following components:   Hemoglobin 11.3 (*)    HCT 35.6 (*)    All other components within normal limits  I-STAT TROPONIN, ED  I-STAT TROPONIN, ED    EKG EKG  Interpretation  Date/Time:  Sunday December 07 2017 14:23:15 EDT Ventricular Rate:  98 PR Interval:  116 QRS Duration: 122 QT Interval:  392 QTC Calculation: 500 R Axis:   -35 Text Interpretation:  Normal sinus rhythm Left axis deviation Right bundle branch block Minimal voltage criteria for LVH, may be normal variant Abnormal ECG No significant change since last tracing Confirmed by Deno Etienne [phone]) on 12/07/2017 3:23:40 PM   Radiology Dg Chest 2 View  Result Date: 12/07/2017 CLINICAL DATA:  Right chest pain EXAM: CHEST - 2 VIEW COMPARISON:  08/12/2014 chest radiograph. FINDINGS: Stable cardiomediastinal silhouette with top normal heart size. No pneumothorax. No pleural effusion. Stable minimal scarring at the peripheral lower left lung. No overt pulmonary edema. No acute consolidative airspace disease. IMPRESSION: No active cardiopulmonary disease. Stable minimal scarring in the peripheral lower left lung. Electronically Signed   By: Ilona Sorrel M.D.   On: 12/07/2017 16:03   Ct Angio Chest Pe W And/or Wo Contrast  Result Date: 12/07/2017 CLINICAL DATA:  Left leg swelling. EXAM: CT ANGIOGRAPHY CHEST WITH CONTRAST TECHNIQUE: Multidetector CT imaging of the chest was performed using the standard protocol during bolus administration of intravenous contrast. Multiplanar CT image reconstructions and MIPs were obtained to evaluate the vascular anatomy. CONTRAST:  139m ISOVUE-370 IOPAMIDOL (ISOVUE-370) INJECTION 76% COMPARISON:  Radiographs of same day.  CT scan of August 13, 2014. FINDINGS: Cardiovascular: Satisfactory opacification of the pulmonary arteries to the segmental level. No evidence of pulmonary embolism. Normal heart size. No pericardial effusion. Mediastinum/Nodes: No enlarged mediastinal, hilar, or axillary lymph nodes. Thyroid gland, trachea, and esophagus demonstrate no significant findings. Lungs/Pleura: Lungs are clear. No pleural effusion or pneumothorax. Upper Abdomen: No acute  abnormality. Musculoskeletal: No chest wall abnormality. No acute or significant osseous findings. Review of the MIP images confirms the above findings. IMPRESSION: No definite evidence of pulmonary embolus. No acute abnormality seen in the chest. Electronically Signed   By: JMarijo Conception M.D.   On: 12/07/2017 19:02    Procedures Procedures (including critical care time)  Medications Ordered in ED Medications  iopamidol (ISOVUE-370) 76 % injection (has no administration in time range)  rivaroxaban (XARELTO) Education Kit for DVT/PE patients (has no administration in time range)  Rivaroxaban (XARELTO) tablet 15 mg (has no administration in time range)  iopamidol (ISOVUE-370) 76 % injection 100 mL (100 mLs Intravenous Contrast Given 12/07/17 1817)     Initial Impression / Assessment and Plan / ED Course  I have reviewed the triage vital signs and the nursing notes.  Pertinent labs & imaging results that were available during my care of the patient were reviewed by me and considered in my medical decision making (see chart for details).     72 yo F with a chief complaint of left lower extremity edema.  Going on for 3 weeks.  Will obtain a DVT study.  Otherwise I feel is unlikely that she has a PE.  Her symptoms are very atypical.  She is also a low risk history for an MI though patient is 56 and had pain just prior to arrival I will therefore delta troponin her.  Delta troponin is negative.  The patient unfortunately has a left lower extremity DVT.  CT scan is negative for acute PE.  I discussed the results with the patient.  We will start her on Xarelto.  Have her follow-up with her family doctor in the office this week.  7:51 PM:  I have discussed the diagnosis/risks/treatment options with the patient and family and believe the pt to be eligible for discharge home to follow-up with PCP. We also discussed returning to the ED immediately if new or worsening sx occur. We discussed the sx  which are most concerning (e.g., sudden worsening chest pain, sob, syncope, rectal bleeding) that necessitate immediate return. Medications administered to the patient during their visit and any new prescriptions provided to the patient are listed below.  Medications given during this visit Medications  iopamidol (ISOVUE-370) 76 % injection (has no administration in time range)  rivaroxaban (XARELTO) Education Kit for DVT/PE patients (has no administration in time range)  Rivaroxaban (XARELTO) tablet 15 mg (has no administration in time range)  iopamidol (ISOVUE-370) 76 % injection 100 mL (100 mLs Intravenous Contrast Given 12/07/17 1817)      The patient appears reasonably screen and/or stabilized for discharge and I doubt any other medical condition or other [facility] requiring further screening, evaluation, or treatment in the ED at this time prior to discharge.    Final Clinical Impressions(s) / ED Diagnoses   Final diagnoses:  Acute deep vein thrombosis (DVT) of femoral vein of left lower extremity [facility])    ED Discharge Orders         Ordered    Rivaroxaban 15 & 20 MG TBPK     12/07/17 1949           Deno Etienne, DO 12/07/17 1951

## 2017-12-07 NOTE — ED Notes (Signed)
ED Provider at bedside. 

## 2017-12-07 NOTE — Progress Notes (Signed)
VASCULAR LAB PRELIMINARY  PRELIMINARY  PRELIMINARY  PRELIMINARY  Left lower extremity venous duplex completed.    Preliminary report:  There is acute DVT noted in the left femoral, profunda, and popliteal veins.  Unable to adequately interrogate the common femoral vein and higher or the calf, secondary to significant edema and body habitus.  Gave report to Dr. Darnell Level, [facility], RVT 12/07/2017, 5:04 PM

## 2018-01-18 ENCOUNTER — Other Ambulatory Visit: Payer: Self-pay

## 2018-01-18 ENCOUNTER — Encounter (HOSPITAL_COMMUNITY): Payer: Self-pay | Admitting: Emergency Medicine

## 2018-01-18 ENCOUNTER — Inpatient Hospital Stay (HOSPITAL_COMMUNITY)
Admission: EM | Admit: 2018-01-18 | Discharge: 2018-01-21 | DRG: 253 | Disposition: A | Discharge status: Home / Self Care | Payer: Medicare Other | Attending: Vascular Surgery | Admitting: Vascular Surgery

## 2018-01-18 ENCOUNTER — Emergency Department (HOSPITAL_BASED_OUTPATIENT_CLINIC_OR_DEPARTMENT_OTHER): Payer: Medicare Other

## 2018-01-18 DIAGNOSIS — I87092 Postthrombotic syndrome with other complications of left lower extremity: Principal | ICD-10-CM | POA: Diagnosis present

## 2018-01-18 DIAGNOSIS — M7989 Other specified soft tissue disorders: Secondary | ICD-10-CM | POA: Diagnosis not present

## 2018-01-18 DIAGNOSIS — K219 Gastro-esophageal reflux disease without esophagitis: Secondary | ICD-10-CM | POA: Diagnosis present

## 2018-01-18 DIAGNOSIS — M79609 Pain in unspecified limb: Secondary | ICD-10-CM

## 2018-01-18 DIAGNOSIS — Z833 Family history of diabetes mellitus: Secondary | ICD-10-CM | POA: Diagnosis not present

## 2018-01-18 DIAGNOSIS — I82402 Acute embolism and thrombosis of unspecified deep veins of left lower extremity: Secondary | ICD-10-CM | POA: Diagnosis present

## 2018-01-18 DIAGNOSIS — Z823 Family history of stroke: Secondary | ICD-10-CM | POA: Diagnosis not present

## 2018-01-18 DIAGNOSIS — Z6841 Body Mass Index (BMI) 40.0 and over, adult: Secondary | ICD-10-CM

## 2018-01-18 DIAGNOSIS — Z96653 Presence of artificial knee joint, bilateral: Secondary | ICD-10-CM | POA: Diagnosis present

## 2018-01-18 DIAGNOSIS — Z803 Family history of malignant neoplasm of breast: Secondary | ICD-10-CM | POA: Diagnosis not present

## 2018-01-18 DIAGNOSIS — I82512 Chronic embolism and thrombosis of left femoral vein: Secondary | ICD-10-CM | POA: Diagnosis present

## 2018-01-18 DIAGNOSIS — Z8261 Family history of arthritis: Secondary | ICD-10-CM

## 2018-01-18 DIAGNOSIS — Z8719 Personal history of other diseases of the digestive system: Secondary | ICD-10-CM

## 2018-01-18 DIAGNOSIS — I82422 Acute embolism and thrombosis of left iliac vein: Secondary | ICD-10-CM

## 2018-01-18 DIAGNOSIS — I82409 Acute embolism and thrombosis of unspecified deep veins of unspecified lower extremity: Secondary | ICD-10-CM | POA: Diagnosis present

## 2018-01-18 DIAGNOSIS — Z90710 Acquired absence of both cervix and uterus: Secondary | ICD-10-CM

## 2018-01-18 DIAGNOSIS — Z7901 Long term (current) use of anticoagulants: Secondary | ICD-10-CM

## 2018-01-18 DIAGNOSIS — Z820 Family history of epilepsy and other diseases of the nervous system: Secondary | ICD-10-CM

## 2018-01-18 DIAGNOSIS — Z8249 Family history of ischemic heart disease and other diseases of the circulatory system: Secondary | ICD-10-CM | POA: Diagnosis not present

## 2018-01-18 DIAGNOSIS — H9190 Unspecified hearing loss, unspecified ear: Secondary | ICD-10-CM | POA: Diagnosis present

## 2018-01-18 DIAGNOSIS — Z9049 Acquired absence of other specified parts of digestive tract: Secondary | ICD-10-CM

## 2018-01-18 DIAGNOSIS — I82492 Acute embolism and thrombosis of other specified deep vein of left lower extremity: Secondary | ICD-10-CM | POA: Diagnosis present

## 2018-01-18 LAB — COMPREHENSIVE METABOLIC PANEL
ALT: 13 U/L (ref 0–44)
AST: 19 U/L (ref 15–41)
Albumin: 3.5 g/dL (ref 3.5–5.0)
Alkaline Phosphatase: 74 U/L (ref 38–126)
Anion gap: 8 (ref 5–15)
BUN: 11 mg/dL (ref 8–23)
CO2: 25 mmol/L (ref 22–32)
Calcium: 8.9 mg/dL (ref 8.9–10.3)
Chloride: 101 mmol/L (ref 98–111)
Creatinine, Ser: 1 mg/dL (ref 0.44–1.00)
GFR calc Af Amer: >60 mL/min (ref >60)
GFR calc non Af Amer: 56 mL/min — ABNORMAL LOW (ref >60)
Glucose, Bld: 110 mg/dL — ABNORMAL HIGH (ref 70–99)
Potassium: 3.6 mmol/L (ref 3.5–5.1)
Sodium: 134 mmol/L — ABNORMAL LOW (ref 135–145)
Total Bilirubin: 0.3 mg/dL (ref 0.3–1.2)
Total Protein: 7.6 g/dL (ref 6.5–8.1)

## 2018-01-18 LAB — CBC WITH DIFFERENTIAL/PLATELET
Abs Immature Granulocytes: 0.01 10*3/uL (ref 0.00–0.07)
Basophils Absolute: 0 10*3/uL (ref 0.0–0.1)
Basophils Relative: 1 %
Eosinophils Absolute: 0.1 10*3/uL (ref 0.0–0.5)
Eosinophils Relative: 3 %
HCT: 36.9 % (ref 36.0–46.0)
Hemoglobin: 11.1 g/dL — ABNORMAL LOW (ref 12.0–15.0)
Immature Granulocytes: 0 %
Lymphocytes Relative: 35 %
Lymphs Abs: 1.6 10*3/uL (ref 0.7–4.0)
MCH: 26.7 pg (ref 26.0–34.0)
MCHC: 30.1 g/dL (ref 30.0–36.0)
MCV: 88.7 fL (ref 80.0–100.0)
Monocytes Absolute: 0.5 10*3/uL (ref 0.1–1.0)
Monocytes Relative: 10 %
Neutro Abs: 2.4 10*3/uL (ref 1.7–7.7)
Neutrophils Relative %: 51 %
Platelets: 285 10*3/uL (ref 150–400)
RBC: 4.16 MIL/uL (ref 3.87–5.11)
RDW: 13.4 % (ref 11.5–15.5)
WBC: 4.7 10*3/uL (ref 4.0–10.5)
nRBC: 0 % (ref 0.0–0.2)

## 2018-01-18 MED ORDER — CYCLOSPORINE 0.05 % OP EMUL
1.0000 [drp] | Freq: Two times a day (BID) | OPHTHALMIC | Status: DC
  Administered 2018-01-18 – 2018-01-21 (×4): 1 [drp] via OPHTHALMIC
  Filled 2018-01-18 (×6): qty 1

## 2018-01-18 MED ORDER — OLOPATADINE HCL 0.1 % OP SOLN
1.0000 [drp] | Freq: Two times a day (BID) | OPHTHALMIC | Status: DC
  Administered 2018-01-19 – 2018-01-21 (×4): 1 [drp] via OPHTHALMIC
  Filled 2018-01-18: qty 5

## 2018-01-18 MED ORDER — ALUM & MAG HYDROXIDE-SIMETH 200-200-20 MG/5ML PO SUSP: 15.0000 mL | ORAL | Status: DC | PRN

## 2018-01-18 MED ORDER — METOPROLOL TARTRATE 5 MG/5ML IV SOLN: 2.0000 mg | INTRAVENOUS | Status: DC | PRN

## 2018-01-18 MED ORDER — GUAIFENESIN-DM 100-10 MG/5ML PO SYRP: 15.0000 mL | ORAL_SOLUTION | ORAL | Status: DC | PRN

## 2018-01-18 MED ORDER — SODIUM CHLORIDE 0.9 % IV SOLN
INTRAVENOUS | Status: DC
  Administered 2018-01-18: 23:00:00 via INTRAVENOUS

## 2018-01-18 MED ORDER — TRAMADOL HCL 50 MG PO TABS
50.0000 mg | ORAL_TABLET | Freq: Four times a day (QID) | ORAL | Status: DC | PRN
  Administered 2018-01-19 – 2018-01-20 (×4): 50 mg via ORAL
  Filled 2018-01-18 (×4): qty 1

## 2018-01-18 MED ORDER — PHENOL 1.4 % MT LIQD: 1.0000 | OROMUCOSAL | Status: DC | PRN

## 2018-01-18 MED ORDER — LABETALOL HCL 5 MG/ML IV SOLN: 10.0000 mg | INTRAVENOUS | Status: DC | PRN

## 2018-01-18 MED ORDER — ONDANSETRON HCL 4 MG/2ML IJ SOLN: 4.0000 mg | Freq: Four times a day (QID) | INTRAMUSCULAR | Status: DC | PRN

## 2018-01-18 MED ORDER — HYDROCODONE-ACETAMINOPHEN 5-325 MG PO TABS
1.0000 | ORAL_TABLET | Freq: Once | ORAL | Status: AC
  Administered 2018-01-18: 1 via ORAL
  Filled 2018-01-18: qty 1

## 2018-01-18 MED ORDER — PANTOPRAZOLE SODIUM 40 MG PO TBEC
40.0000 mg | DELAYED_RELEASE_TABLET | Freq: Every day | ORAL | Status: DC
  Administered 2018-01-19 – 2018-01-21 (×3): 40 mg via ORAL
  Filled 2018-01-18 (×3): qty 1

## 2018-01-18 MED ORDER — HEPARIN (PORCINE) 25000 UT/250ML-% IV SOLN
1400.0000 [IU]/h | INTRAVENOUS | Status: DC
  Administered 2018-01-18: 1200 [IU]/h via INTRAVENOUS
  Filled 2018-01-18: qty 250

## 2018-01-18 MED ORDER — HYDRALAZINE HCL 20 MG/ML IJ SOLN: 5.0000 mg | INTRAMUSCULAR | Status: DC | PRN

## 2018-01-18 MED ORDER — MORPHINE SULFATE (PF) 2 MG/ML IV SOLN: 2.0000 mg | INTRAVENOUS | Status: DC | PRN

## 2018-01-18 MED ORDER — POTASSIUM CHLORIDE CRYS ER 20 MEQ PO TBCR: 20.0000 meq | EXTENDED_RELEASE_TABLET | Freq: Once | ORAL | Status: DC

## 2018-01-18 NOTE — ED Notes (Signed)
VAS [place] w/ pt.

## 2018-01-18 NOTE — Progress Notes (Signed)
VASCULAR LAB PRELIMINARY  PRELIMINARY  PRELIMINARY  PRELIMINARY  Left lower extremity venous duplex completed.    Preliminary report:  DVT found 12/07/17 remains in the popliteal vein.  The common femoral vein is not well insonated, but is most likely thrombosed.  Unable to visualize femoral vein or calf veins secondary to significant edema, body habitus, and skin texture.  Jonnelle Lawniczak, RVT 01/18/2018, 7:31 PM

## 2018-01-18 NOTE — ED Provider Notes (Signed)
[facility] EMERGENCY DEPARTMENT Provider Note   CSN: [id number] Arrival date & time: 01/18/18  1745     History   Chief Complaint Chief Complaint  Patient presents with  . Leg Pain    HPI [name] is a 72 y.o. female with a PMHx of GERD, HTN, arthritis, RBBB, and recently diagnosed LLE DVT (diagnosed in ED on 12/07/17), with additional conditions listed below, who presents to the ED with complaints of ongoing left leg pain for the last 1.5 months.  Patient was seen in the ED on 12/07/2017 for left leg swelling and pain that have been going on about 3 weeks, she was diagnosed with a DVT and placed on Xarelto which she states she has been compliant with.  Of note, at that visit she had a CT angiogram of her chest which was negative for any PE.  She states that she has been taking the Xarelto as prescribed but has not noticed any improvement in her symptoms so she came back for repeat evaluation.  She describes her pain as 10/10 constant aching left upper leg pain that radiates down the entire leg, worse with elevating, and sometimes improved with walking, unrelieved with Tylenol #3.  She reports swelling, mild warmth and erythema to the area, but states that these were all present at her last ED visit, and states that overall it just has not improved but does not seem to be getting worse.  She denies any recent travel/surgery/immobilization, or any estrogen use.  She also denies any red streaking, fevers, chills, CP, SOB, abd pain, N/V/D/C, hematuria, dysuria, numbness, tingling, focal weakness, or any other complaints at this time.   The history is provided by the patient and medical records. No language interpreter was used.  Leg Pain   Pertinent negatives include no numbness.    Past Medical History:  Diagnosis Date  . Acute upper respiratory infection 07/06/2014  . Anemia   . Arthritis    Back   . Colon polyp    Tubular Adenoma   . Cough productive of clear  sputum 06/22/2014  . GERD (gastroesophageal reflux disease)   . History of right bundle branch block (RBBB)   . HOH (hard of hearing)   . Hypertension    had in the past, is no longer on medication for this and blood pressures are WNL  . 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 Primary localized osteoarthritis of right knee     Patient Active Problem List   Diagnosis Date Noted  . Acute upper respiratory infection 07/06/2014  . 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 DJD (degenerative joint disease) of knee 07/04/2014  . Primary localized osteoarthritis of right knee   . Arthritis   . Anemia   . GERD (gastroesophageal reflux disease)   . Colon polyp   . HOH (hard of hearing)   . Postoperative anemia due to acute blood loss 05/01/2011  . 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 ABDOMINAL HYSTERECTOMY  2012  . CHOLECYSTECTOMY N/A 03/09/2013   Procedure: LAPAROSCOPIC CHOLECYSTECTOMY;  Surgeon: Gayland Curry, MD;  Location: [facility];  Service: General;  Laterality: N/A;  . COLONOSCOPY W/ BIOPSIES    . LARYNGOSCOPY Left 03/14/2017   Procedure: LARYNGOSCOPY;  Surgeon: Helayne Seminole, MD;  Location: [facility];  Service: ENT;  Laterality: Left;  . TOTAL KNEE ARTHROPLASTY  04/29/2011   Procedure: TOTAL KNEE ARTHROPLASTY;  Surgeon:  Lorn Junes, MD;  Location: [facility];  Service: Orthopedics;  Laterality: Left;  DR [place] THIS CASE  . TOTAL KNEE ARTHROPLASTY Right 07/04/2014   Procedure: TOTAL KNEE ARTHROPLASTY;  Surgeon: Elsie Saas, MD;  Location: [facility];  Service: Orthopedics;  Laterality: Right;     OB History   None      Home Medications    Prior to Admission medications   Medication Sig Start Date End Date Taking? Authorizing Provider  acetaminophen (TYLENOL) 325 MG tablet Take 2 tablets (650 mg total) by mouth every 6 (six) hours as needed for mild pain (or Fever >/= 101). Patient not taking:  Reported on 03/04/2017 07/06/14   Shepperson, Kirstin, PA-C  Olopatadine HCl 0.2 % SOLN Place 1 drop into both eyes daily. 12/06/17   [provider]  RESTASIS 0.05 % ophthalmic emulsion Place 1 drop into both eyes 2 (two) times daily. 10/01/17   [provider]  Rivaroxaban 15 & 20 MG TBPK Take as directed on package: Start with one 15mg  tablet by mouth twice a day with food. On Day 22, switch to one 20mg  tablet once a day with food. 12/07/17   Deno Etienne, DO  traMADol (ULTRAM) 50 MG tablet Take 1 tablet (50 mg total) by mouth every 6 (six) hours as needed. Patient not taking: Reported on 03/04/2017 02/02/17   Okey Regal, PA-C    Family History Family History  Problem Relation Age of Onset  . Arthritis Mother   . Hypertension Mother   . Alzheimer's disease Father   . Diabetes Sister   . Hypertension Sister   . Hypertension Brother   . Stroke Brother   . Hypertension Brother   . Hypertension Sister   . Hypertension Sister   . Hypertension Sister   . Breast cancer Other        Niece  . Anesthesia problems Neg Hx   . Hypotension Neg Hx   . Malignant hyperthermia Neg Hx   . Pseudochol deficiency Neg Hx   . Colon cancer Neg Hx     Social History Social History   Tobacco Use  . Smoking status: Former Smoker    Years: 1.00    Last attempt to quit: 04/22/1988    Years since quitting: 29.7  . Smokeless tobacco: Never Used  Substance Use Topics  . Alcohol use: No  . Drug use: No     Allergies   Patient has no known allergies.   Review of Systems Review of Systems  Constitutional: Negative for chills and fever.  Respiratory: Negative for shortness of breath.   Cardiovascular: Negative for chest pain.  Gastrointestinal: Negative for abdominal pain, constipation, diarrhea, nausea and vomiting.  Genitourinary: Negative for dysuria and hematuria.  Musculoskeletal: Positive for arthralgias, joint swelling and myalgias.  Skin: Positive for color change.    Allergic/Immunologic: Negative for immunocompromised state.  Neurological: Negative for weakness and numbness.  Psychiatric/Behavioral: Negative for confusion.   All other systems reviewed and are negative for acute change except as noted in the HPI.    Physical Exam Updated Vital Signs BP (!) 141/76 (BP Location: Left Arm)   Pulse 95   Temp 98.3 F (36.8 C) (Oral)   Resp 18   Ht 5\' 1"  (1.549 m)   Wt 112.9 kg   SpO2 100%   BMI 47.05 kg/m   Physical Exam  Constitutional: She is oriented to person, place, and time. Vital signs are normal. She appears well-developed and  well-nourished.  Non-toxic appearance. No distress.  Afebrile, nontoxic, NAD  HENT:  Head: Normocephalic and atraumatic.  Mouth/Throat: Oropharynx is clear and moist and mucous membranes are normal.  Eyes: Conjunctivae and EOM are normal. Right eye exhibits no discharge. Left eye exhibits no discharge.  Neck: Normal range of motion. Neck supple.  Cardiovascular: Normal rate, regular rhythm, normal heart sounds and intact distal pulses. Exam reveals no gallop and no friction rub.  No murmur heard. Pulmonary/Chest: Effort normal and breath sounds normal. No respiratory distress. She has no decreased breath sounds. She has no wheezes. She has no rhonchi. She has no rales.  Abdominal: Soft. Normal appearance and bowel sounds are normal. She exhibits no distension. There is no tenderness. There is no rigidity, no rebound, no guarding, no CVA tenderness, no tenderness at McBurney's point and negative Murphy's sign.  Musculoskeletal: Normal range of motion.       Left upper leg: She exhibits tenderness, swelling and edema.  LLE with mild diffuse TTP from the groin down to the lower leg, faint erythema compared to contralateral side, faintly warm to touch but not significantly, orange peel appearance of skin, no fluctuance or frank induration. Body habitus limits exam slightly, but LLE appears to have 2+ pitting edema  throughout. Strength and sensation grossly intact, distal pulses intact, compartments soft.   Neurological: She is alert and oriented to person, place, and time. She has normal strength. No sensory deficit.  Skin: Skin is warm, dry and intact. No rash noted.  Psychiatric: She has a normal mood and affect.  Nursing note and vitals reviewed.    ED Treatments / Results  Labs (all labs ordered are listed, but only abnormal results are displayed) Labs Reviewed  CBC WITH DIFFERENTIAL/PLATELET - Abnormal; Notable for the following components:      Result Value   Hemoglobin 11.1 (*)    All other components within normal limits  COMPREHENSIVE METABOLIC PANEL - Abnormal; Notable for the following components:   Sodium 134 (*)    Glucose, Bld 110 (*)    GFR calc non Af Amer 56 (*)    All other components within normal limits    EKG None  Radiology Vas [place] Lower Extremity Venous (dvt) (only [facility])  Result Date: 01/18/2018  Lower Venous Study Indications: Pain, Swelling, and On Xarelto for DVT found on significantly limited study done 12/07/17.  Limitations: Body habitus and skin texture, and massive edema. Comparison Study: Positive study done 12/07/17 is available for comparison. Performing Technologist: Sharion Dove RVS  Examination Guidelines: A complete evaluation includes B-mode imaging, spectral Doppler, color Doppler, and power Doppler as needed of all accessible portions of each vessel. Bilateral testing is considered an integral part of a complete examination. Limited examinations for reoccurring indications may be performed as noted.  Right Venous Findings: +---+---------------+---------+-----------+----------+-------+    CompressibilityPhasicitySpontaneityPropertiesSummary +---+---------------+---------+-----------+----------+-------+ CFVFull                                                 +---+---------------+---------+-----------+----------+-------+  Left Venous  Findings: +---------+---------------+---------+-----------+----------+--------------+          CompressibilityPhasicitySpontaneityPropertiesSummary        +---------+---------------+---------+-----------+----------+--------------+ CFV      None           No       No                                  +---------+---------------+---------+-----------+----------+--------------+  SFJ                                                   not visualized +---------+---------------+---------+-----------+----------+--------------+ FV Prox                                               not visualized +---------+---------------+---------+-----------+----------+--------------+ FV Mid                                                not visualized +---------+---------------+---------+-----------+----------+--------------+ FV Distal                                             not visualized +---------+---------------+---------+-----------+----------+--------------+ PFV                                                   not visualized +---------+---------------+---------+-----------+----------+--------------+ POP      None           No       No                                  +---------+---------------+---------+-----------+----------+--------------+ PTV                                                   not visualized +---------+---------------+---------+-----------+----------+--------------+ PERO                                                  not visualized +---------+---------------+---------+-----------+----------+--------------+  Left Technical Findings: Not visualized segments include SFJ, femoral, profunda, posterior tibial, and peroneal vein.   Summary: Right: No evidence of common femoral vein obstruction. Left: Findings consistent with acute deep vein thrombosis involving the left common femoral vein, and left popliteal vein.  *See table(s) above for  measurements and observations. Electronically signed by Servando Snare MD on 01/18/2018 at 8:52:11 PM.    Final      VAS DUPLEX U/S LLE: VASCULAR LAB PRELIMINARY  PRELIMINARY  PRELIMINARY  PRELIMINARY  Left lower extremity venous duplex completed.    Preliminary report:  DVT found 12/07/17 remains in the popliteal vein.  The common femoral vein is not well insonated, but is most likely thrombosed.  Unable to visualize femoral vein or calf veins secondary to significant edema, body habitus, and skin texture.  KANADY, CANDACE, RVT 01/18/2018, 7:31 PM     12/07/17 VAS U/S Duplex LLE: Left Venous Findings: +---------+---------------+---------+-----------+----------+-------------------+      CompressibilityPhasicitySpontaneityPropertiesSummary       +---------+---------------+---------+-----------+----------+-------------------+ CFV  cannot compress                                secondary to edema,                              flow noted      +---------+---------------+---------+-----------+----------+-------------------+ FV Prox None                      Acute        +---------+---------------+---------+-----------+----------+-------------------+ FV Mid  None                      Acute        +---------+---------------+---------+-----------+----------+-------------------+ FV Distal                        unable to visualize +---------+---------------+---------+-----------+----------+-------------------+ PFV   None                      Acute        +---------+---------------+---------+-----------+----------+-------------------+ POP   None      No    No          Acute         +---------+---------------+---------+-----------+----------+-------------------+  Left Technical Findings: Not visualized segments include posterior tibial and peroneal veins.  Summary: Right: No evidence of common femoral vein obstruction. Left: Findings consistent with acute deep vein thrombosis involving the left femoral vein, left proximal profunda vein, and left popliteal vein. Unable to adequately interrogate the common femoral and higher, or the calf secondary to significant edema  and body habitus.  *See table(s) above for measurements and observations.  Electronically signed by Harold Barban MD on 12/07/2017 at 8:08:47 PM.   Procedures Procedures (including critical care time)  Medications Ordered in ED Medications  HYDROcodone-acetaminophen (NORCO/VICODIN) 5-325 MG per tablet 1 tablet (1 tablet Oral Given 01/18/18 2022)     Initial Impression / Assessment and Plan / ED Course  I have reviewed the triage vital signs and the nursing notes.  Pertinent labs & imaging results that were available during my care of the patient were reviewed by me and considered in my medical decision making (see chart for details).     72 y.o. female here with ongoing left leg pain, was diagnosed with a DVT on 12/07/2017 and was placed on Xarelto which she has been compliant with.  States that the swelling and pain has not gotten any better, although not really any worse either.  On exam, large body habitus limits exam slightly however left leg with swelling from the groin down, approximately 2+ pitting edema throughout.  Slightly more erythematous compared to the other side, orange peel appearance to the skin, very faint warmth, distal pulses intact.  Overall still seems consistent with DVT, doubt cellulitic component given the timeframe, but will obtain repeat DVT ultrasound to ensure no worsening or progression of the DVTs that she had previously.  We will get labs to check renal function and  CBC, and reassess shortly. Discussed case with my attending Dr. Tamera Punt who agrees with plan. Pt declined wanting anything for pain at this time. Will reassess shortly.   8:05 PM CBC w/diff with mild stable anemia. CMP unremarkable. DVT U/S unchanged from prior. Will touch base with vascular surgery to discuss case. Pt requesting pain meds, will give vicodin  for now.   8:16 PM Dr. Donzetta Matters of vascular surgery returning page, will come evaluate the pt and determine next step. Will await further instructions.   9:18 PM Dr. Donzetta Matters down to see the pt, will admit her to his service and take her for endovascular procedure tomorrow. Requests heparin drip to be started here. Holding orders to be placed by admitting team. Please see their notes for further documentation of care. I appreciate their help with this pleasant pt's care. Pt stable at time of admission.    Final Clinical Impressions(s) / ED Diagnoses   Final diagnoses:  Acute deep vein thrombosis (DVT) of left lower extremity, unspecified vein [facility])    ED Discharge Orders    [address] [place], [place] 01/18/18 2119    Malvin Johns, MD 01/18/18 2127

## 2018-01-18 NOTE — Progress Notes (Signed)
ANTICOAGULATION CONSULT NOTE - Initial Consult  Pharmacy Consult for heparin  Indication: DVT  No Known Allergies  Patient Measurements: Height: 5\' 1"  (154.9 cm) Weight: 249 lb (112.9 kg) IBW/kg (Calculated) : 47.8  Heparin dosing wt: 76kg   Vital Signs: Temp: 98.3 F (36.8 C) (12/01 1749) Temp Source: Oral (12/01 1749) BP: 105/94 (12/01 2115) Pulse Rate: 78 (12/01 2115)  Labs: Recent Labs    01/18/18 1902  HGB 11.1*  HCT 36.9  PLT 285  CREATININE 1.00    Estimated Creatinine Clearance: 59.2 mL/min (by C-G formula based on SCr of 1 mg/dL).   Medical History: Past Medical History:  Diagnosis Date  . Acute upper respiratory infection 07/06/2014  . Anemia   . Arthritis    Back   . Colon polyp    Tubular Adenoma   . Cough productive of clear sputum 06/22/2014  . GERD (gastroesophageal reflux disease)   . History of right bundle branch block (RBBB)   . HOH (hard of hearing)   . Hypertension    had in the past, is no longer on medication for this and blood pressures are WNL  . 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 Primary localized osteoarthritis of right knee      Assessment: 72 yo female with DVT noted on xarelto PTA (last dose 12/1 at 9am). Pharmacy consulted to dose heparin  -Hg= 11.1, plt= 285  Goal of Therapy:  Heparin level 0.3-0.7 units/ml aPTT 66-102 seconds Monitor platelets by anticoagulation protocol: Yes   Plan:   -Start heparin (with no bolus) at 1200 units/hr -aptt/heparin level in 6 hours and daily wth CBC daily  Hildred Laser, PharmD Clinical Pharmacist **Pharmacist phone directory can now be found on [email or web address] ([email or web address]).  Listed under [place].

## 2018-01-18 NOTE — H&P (Signed)
H+P   Reason for Consult:  Left leg dvt Referring Physician:  ED Tyrone Nine) MRN #:  [id number]  History of Present Illness: This is a 72 y.o. female without significant history of lower extremity issues except bilateral knee replacements.  Presented in October with left lower extremity swelling was found to have DVT including her femoral vein started on Xarelto.  Since that time she has had difficulty walking with persistent swelling.  States she has had no improvement.  She does not have any ulceration.  Prior to the last DVT she did not note any trauma.  She has not taken any long trips had not been ill.  No history of DVT in either her or family.  She has no history of bleeding denies any history of stroke and has not had bleeding issues on Xarelto.  Return to ED today with persistent pain swelling and heaviness to left lower extremity.  Past Medical History:  Diagnosis Date  . Acute upper respiratory infection 07/06/2014  . Anemia   . Arthritis    Back   . Colon polyp    Tubular Adenoma   . Cough productive of clear sputum 06/22/2014  . GERD (gastroesophageal reflux disease)   . History of right bundle branch block (RBBB)   . HOH (hard of hearing)   . Hypertension    had in the past, is no longer on medication for this and blood pressures are WNL  . 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 Primary localized osteoarthritis of right knee     Past Surgical History:  Procedure Laterality Date  . ABDOMINAL HYSTERECTOMY  2012  . CHOLECYSTECTOMY N/A 03/09/2013   Procedure: LAPAROSCOPIC CHOLECYSTECTOMY;  Surgeon: Gayland Curry, MD;  Location: [facility];  Service: General;  Laterality: N/A;  . COLONOSCOPY W/ BIOPSIES    . LARYNGOSCOPY Left 03/14/2017   Procedure: LARYNGOSCOPY;  Surgeon: Helayne Seminole, MD;  Location: [facility];  Service: ENT;  Laterality: Left;  . TOTAL KNEE ARTHROPLASTY  04/29/2011   Procedure: TOTAL KNEE ARTHROPLASTY;  Surgeon: Lorn Junes, MD;  Location: [facility];  Service: Orthopedics;   Laterality: Left;  DR [place] THIS CASE  . TOTAL KNEE ARTHROPLASTY Right 07/04/2014   Procedure: TOTAL KNEE ARTHROPLASTY;  Surgeon: Elsie Saas, MD;  Location: [facility];  Service: Orthopedics;  Laterality: Right;    No Known Allergies  Prior to Admission medications   Medication Sig Start Date End Date Taking? Authorizing Provider  acetaminophen (TYLENOL) 325 MG tablet Take 2 tablets (650 mg total) by mouth every 6 (six) hours as needed for mild pain (or Fever >/= 101). Patient not taking: Reported on 03/04/2017 07/06/14   Shepperson, Kirstin, PA-C  Olopatadine HCl 0.2 % SOLN Place 1 drop into both eyes daily. 12/06/17   [provider]  RESTASIS 0.05 % ophthalmic emulsion Place 1 drop into both eyes 2 (two) times daily. 10/01/17   [provider]  Rivaroxaban 15 & 20 MG TBPK Take as directed on package: Start with one 15mg  tablet by mouth twice a day with food. On Day 22, switch to one 20mg  tablet once a day with food. 12/07/17   Deno Etienne, DO  traMADol (ULTRAM) 50 MG tablet Take 1 tablet (50 mg total) by mouth every 6 (six) hours as needed. Patient not taking: Reported on 03/04/2017 02/02/17   Okey Regal, PA-C    Social History   Socioeconomic History  . Marital status: Divorced    Spouse name: Not  on file  . Number of children: 1  . Years of education: Not on file  . Highest education level: Not on file  Occupational History  . Occupation: Retired   Scientific laboratory technician  . Financial resource strain: Not on file  . Food insecurity:    Worry: Not on file    Inability: Not on file  . Transportation needs:    Medical: Not on file    Non-medical: Not on file  Tobacco Use  . Smoking status: Former Smoker    Years: 1.00    Last attempt to quit: 04/22/1988    Years since quitting: 29.7  . Smokeless tobacco: Never Used  Substance and Sexual Activity  . Alcohol use: No  . Drug use: No  . Sexual activity: Yes    Birth control/protection: Surgical    Lifestyle  . Physical activity:    Days per week: Not on file    Minutes per session: Not on file  . Stress: Not on file  Relationships  . Social connections:    Talks on phone: Not on file    Gets together: Not on file    Attends religious service: Not on file    Active member of club or organization: Not on file    Attends meetings of clubs or organizations: Not on file    Relationship status: Not on file  . Intimate partner violence:    Fear of current or ex partner: Not on file    Emotionally abused: Not on file    Physically abused: Not on file    Forced sexual activity: Not on file  Other Topics Concern  . Not on file  Social History Narrative   Daily caffeine      Family History  Problem Relation Age of Onset  . Arthritis Mother   . Hypertension Mother   . Alzheimer's disease Father   . Diabetes Sister   . Hypertension Sister   . Hypertension Brother   . Stroke Brother   . Hypertension Brother   . Hypertension Sister   . Hypertension Sister   . Hypertension Sister   . Breast cancer Other        Niece  . Anesthesia problems Neg Hx   . Hypotension Neg Hx   . Malignant hyperthermia Neg Hx   . Pseudochol deficiency Neg Hx   . Colon cancer Neg Hx     ROS:  Cardiovascular: []  chest pain/pressure []  palpitations []  SOB lying flat []  DOE []  pain in legs while walking []  pain in legs at rest []  pain in legs at night []  non-healing ulcers x] hx of DVT [x]  swelling in legs  Pulmonary: []  productive cough []  asthma/wheezing []  home O2  Neurologic: []  weakness in []  arms []  legs []  numbness in []  arms []  legs [-] hx of CVA []  mini stroke [] difficulty speaking or slurred speech []  temporary loss of vision in one eye []  dizziness  Hematologic: [-] hx of cancer [-] bleeding problems []  problems with blood clotting easily  Endocrine:   []  diabetes []  thyroid disease  GI []  vomiting blood []  blood in stool  GU: []  CKD/renal failure []   HD--[]  M/W/F or []  T/T/S []  burning with urination []  blood in urine  Psychiatric: []  anxiety []  depression  Musculoskeletal: []  arthritis [x]  joint pain  Integumentary: []  rashes []  ulcers  Constitutional: []  fever []  chills   Physical Examination  Vitals:   01/18/18 2000 01/18/18 2015  BP: (!) 120/54 Marland Kitchen)  106/55  Pulse: 85 82  Resp:    Temp:    SpO2: 99% 99%   Body mass index is 47.05 kg/m.  General:  NAD HENT: WNL, normocephalic Pulmonary: normal non-labored breathing Cardiac: Palpable radial pulses bilaterally Palpable dorsalis pedis on the right I cannot palpate pedal pulses on the left given severity of edema Abdomen: soft, NT/ND, no masses Musculoskeletal: Significant edema left lower extremity with peau d'orange appearance of leg below the knee Neurologic: A&O X 3; Appropriate Affect ; SENSATION: normal; MOTOR FUNCTION:  moving all extremities equally. Speech is fluent/normal   CBC    Component Value Date/Time   WBC 4.7 01/18/2018 1902   RBC 4.16 01/18/2018 1902   HGB 11.1 (L) 01/18/2018 1902   HCT 36.9 01/18/2018 1902   PLT 285 01/18/2018 1902   MCV 88.7 01/18/2018 1902   MCH 26.7 01/18/2018 1902   MCHC 30.1 01/18/2018 1902   RDW 13.4 01/18/2018 1902   LYMPHSABS 1.6 01/18/2018 1902   MONOABS 0.5 01/18/2018 1902   EOSABS 0.1 01/18/2018 1902   BASOSABS 0.0 01/18/2018 1902    BMET    Component Value Date/Time   NA 134 (L) 01/18/2018 1902   K 3.6 01/18/2018 1902   CL 101 01/18/2018 1902   CO2 25 01/18/2018 1902   GLUCOSE 110 (H) 01/18/2018 1902   BUN 11 01/18/2018 1902   CREATININE 1.00 01/18/2018 1902   CALCIUM 8.9 01/18/2018 1902   GFRNONAA 56 (L) 01/18/2018 1902   GFRAA >60 01/18/2018 1902    COAGS: Lab Results  Component Value Date   INR 1.25 07/04/2014   INR 1.28 06/22/2014   INR 1.19 04/25/2011     Non-Invasive Vascular Imaging:   I have independently interpreted her DVT study which demonstrates unlikely flow in her  common femoral vein on the left with DVT extending down to the level of the popliteal vein difficulty visualizing the femoral vein or tibial veins.   ASSESSMENT/PLAN: This is a 72 y.o. female history of DVT first diagnosed in October now presents with persistent left lower extremity swelling has a component of post thrombotic syndrome as she is taking Xarelto religiously.  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  Discussed with her the options being continued anticoagulation possibly switching to a separate agent and elevation of the leg as well as compression.  Alternatively we could plan for venogram with possible intervention.  She would like to take this route.  We will plan on prone positioning and accessing her left popliteal vein tomorrow for venogram with intravascular ultrasound.  I discussed with her the need for possible lytic therapy as well as possible thrombectomy.  She demonstrates good understanding.  She will be n.p.o. past midnight we will plan for this procedure late morning tomorrow.  Heparin drip was ordered.  Jyden Kromer C. Donzetta Matters, MD [facility] Office: [phone] Pager: [phone]

## 2018-01-18 NOTE — ED Triage Notes (Signed)
Patient c/o left leg pain and swelling for 1 month. She has been seen by her PCP and here about the pain but does not seem to be getting better. Visible swelling seen from the thigh to foot, no redness or warmth.

## 2018-01-19 ENCOUNTER — Inpatient Hospital Stay (HOSPITAL_COMMUNITY)
Admission: EM | Disposition: A | Discharge status: Home / Self Care | Payer: Self-pay | Source: Home / Self Care | Attending: Vascular Surgery

## 2018-01-19 ENCOUNTER — Encounter (HOSPITAL_COMMUNITY): Payer: Self-pay | Admitting: Vascular Surgery

## 2018-01-19 HISTORY — PX: PERIPHERAL VASCULAR INTERVENTION: CATH118257

## 2018-01-19 HISTORY — PX: LOWER EXTREMITY VENOGRAPHY: CATH118253

## 2018-01-19 LAB — BASIC METABOLIC PANEL
Anion gap: 6 (ref 5–15)
BUN: 12 mg/dL (ref 8–23)
CO2: 28 mmol/L (ref 22–32)
Calcium: 9.1 mg/dL (ref 8.9–10.3)
Chloride: 104 mmol/L (ref 98–111)
Creatinine, Ser: 0.86 mg/dL (ref 0.44–1.00)
GFR calc Af Amer: >60 mL/min (ref >60)
GFR calc non Af Amer: >60 mL/min (ref >60)
Glucose, Bld: 104 mg/dL — ABNORMAL HIGH (ref 70–99)
Potassium: 3.9 mmol/L (ref 3.5–5.1)
Sodium: 138 mmol/L (ref 135–145)

## 2018-01-19 LAB — CBC
HCT: 36.2 % (ref 36.0–46.0)
Hemoglobin: 11.1 g/dL — ABNORMAL LOW (ref 12.0–15.0)
MCH: 26.9 pg (ref 26.0–34.0)
MCHC: 30.7 g/dL (ref 30.0–36.0)
MCV: 87.7 fL (ref 80.0–100.0)
Platelets: 269 10*3/uL (ref 150–400)
RBC: 4.13 MIL/uL (ref 3.87–5.11)
RDW: 13.4 % (ref 11.5–15.5)
WBC: 4.2 10*3/uL (ref 4.0–10.5)
nRBC: 0 % (ref 0.0–0.2)

## 2018-01-19 LAB — APTT: aPTT: 44 seconds — ABNORMAL HIGH (ref 24–36)

## 2018-01-19 LAB — HEPARIN LEVEL (UNFRACTIONATED): Heparin Unfractionated: >2.2 IU/mL — ABNORMAL HIGH (ref 0.30–0.70)

## 2018-01-19 LAB — MRSA PCR SCREENING: MRSA by PCR: NEGATIVE

## 2018-01-19 SURGERY — PERIPHERAL VASCULAR INTERVENTION
Anesthesia: LOCAL | Laterality: Left

## 2018-01-19 MED ORDER — ONDANSETRON HCL 4 MG/2ML IJ SOLN: 4.0000 mg | Freq: Four times a day (QID) | INTRAMUSCULAR | Status: DC | PRN

## 2018-01-19 MED ORDER — CLOPIDOGREL BISULFATE 75 MG PO TABS: 300.0000 mg | ORAL_TABLET | Freq: Once | ORAL | Status: DC

## 2018-01-19 MED ORDER — HYDRALAZINE HCL 20 MG/ML IJ SOLN: 5.0000 mg | INTRAMUSCULAR | Status: DC | PRN

## 2018-01-19 MED ORDER — IODIXANOL 320 MG/ML IV SOLN
INTRAVENOUS | Status: DC | PRN
  Administered 2018-01-19: 35 mL via INTRAVENOUS

## 2018-01-19 MED ORDER — MIDAZOLAM HCL 2 MG/2ML IJ SOLN
INTRAMUSCULAR | Status: AC
  Filled 2018-01-19: qty 2

## 2018-01-19 MED ORDER — CLOPIDOGREL BISULFATE 300 MG PO TABS
ORAL_TABLET | ORAL | Status: AC
  Filled 2018-01-19: qty 1

## 2018-01-19 MED ORDER — CLOPIDOGREL BISULFATE 75 MG PO TABS: 75.0000 mg | ORAL_TABLET | Freq: Every day | ORAL | Status: DC

## 2018-01-19 MED ORDER — HEPARIN (PORCINE) IN NACL 1000-0.9 UT/500ML-% IV SOLN
INTRAVENOUS | Status: AC
  Filled 2018-01-19: qty 1000

## 2018-01-19 MED ORDER — SODIUM CHLORIDE 0.9% FLUSH: 3.0000 mL | INTRAVENOUS | Status: DC | PRN

## 2018-01-19 MED ORDER — SODIUM CHLORIDE 0.9 % IV SOLN: 250.0000 mL | INTRAVENOUS | Status: DC | PRN

## 2018-01-19 MED ORDER — HEPARIN SODIUM (PORCINE) 1000 UNIT/ML IJ SOLN
INTRAMUSCULAR | Status: DC | PRN
  Administered 2018-01-19: 10000 [IU] via INTRAVENOUS

## 2018-01-19 MED ORDER — HEPARIN (PORCINE) IN NACL 1000-0.9 UT/500ML-% IV SOLN
INTRAVENOUS | Status: DC | PRN
  Administered 2018-01-19: 500 mL

## 2018-01-19 MED ORDER — CLOPIDOGREL BISULFATE 75 MG PO TABS
75.0000 mg | ORAL_TABLET | Freq: Every day | ORAL | Status: DC
  Administered 2018-01-20 – 2018-01-21 (×2): 75 mg via ORAL
  Filled 2018-01-19 (×2): qty 1

## 2018-01-19 MED ORDER — SODIUM CHLORIDE 0.9 % WEIGHT BASED INFUSION: 1.0000 mL/kg/h | INTRAVENOUS | Status: AC

## 2018-01-19 MED ORDER — HEPARIN (PORCINE) 25000 UT/250ML-% IV SOLN
1400.0000 [IU]/h | INTRAVENOUS | Status: DC
  Administered 2018-01-19 – 2018-01-20 (×2): 1400 [IU]/h via INTRAVENOUS
  Filled 2018-01-19 (×2): qty 250

## 2018-01-19 MED ORDER — FENTANYL CITRATE (PF) 100 MCG/2ML IJ SOLN
INTRAMUSCULAR | Status: DC | PRN
  Administered 2018-01-19: 50 ug via INTRAVENOUS

## 2018-01-19 MED ORDER — CLOPIDOGREL BISULFATE 300 MG PO TABS
ORAL_TABLET | ORAL | Status: DC | PRN
  Administered 2018-01-19: 300 mg via ORAL

## 2018-01-19 MED ORDER — HEPARIN SODIUM (PORCINE) 1000 UNIT/ML IJ SOLN
INTRAMUSCULAR | Status: AC
  Filled 2018-01-19: qty 1

## 2018-01-19 MED ORDER — LABETALOL HCL 5 MG/ML IV SOLN: 10.0000 mg | INTRAVENOUS | Status: DC | PRN

## 2018-01-19 MED ORDER — FENTANYL CITRATE (PF) 100 MCG/2ML IJ SOLN
INTRAMUSCULAR | Status: AC
  Filled 2018-01-19: qty 2

## 2018-01-19 MED ORDER — LIDOCAINE HCL (PF) 1 % IJ SOLN
INTRAMUSCULAR | Status: AC
  Filled 2018-01-19: qty 30

## 2018-01-19 MED ORDER — MIDAZOLAM HCL 2 MG/2ML IJ SOLN
INTRAMUSCULAR | Status: DC | PRN
  Administered 2018-01-19: 1 mg via INTRAVENOUS

## 2018-01-19 MED ORDER — ACETAMINOPHEN 325 MG PO TABS: 650.0000 mg | ORAL_TABLET | ORAL | Status: DC | PRN

## 2018-01-19 MED ORDER — SODIUM CHLORIDE 0.9% FLUSH
3.0000 mL | Freq: Two times a day (BID) | INTRAVENOUS | Status: DC
  Administered 2018-01-20 – 2018-01-21 (×3): 3 mL via INTRAVENOUS

## 2018-01-19 SURGICAL SUPPLY — 19 items
BALLN ATLAS 14X40X75 (BALLOONS) ×2
BALLN MUSTANG 12X60X75 (BALLOONS) ×2
BALLOON ATLAS 14X40X75 (BALLOONS) IMPLANT
BALLOON MUSTANG 12X60X75 (BALLOONS) IMPLANT
CATH ANGIO 5F BER2 65CM (CATHETERS) ×1 IMPLANT
CATH TEMPO AQUA 5F 100CM (CATHETERS) ×1 IMPLANT
CATH VISIONS PV .035 IVUS (CATHETERS) ×1 IMPLANT
GLIDEWIRE ADV .035X260CM (WIRE) ×1 IMPLANT
KIT ENCORE 26 ADVANTAGE (KITS) ×1 IMPLANT
KIT MICROPUNCTURE NIT STIFF (SHEATH) ×1 IMPLANT
KIT PV (KITS) ×2 IMPLANT
SHEATH PINNACLE 5F 10CM (SHEATH) ×1 IMPLANT
SHEATH PINNACLE 8F 10CM (SHEATH) ×1 IMPLANT
SHEATH PINNACLE 9F 10CM (SHEATH) ×1 IMPLANT
SHEATH PROBE COVER 6X72 (BAG) ×1 IMPLANT
STENT VICI VENOUS 14X60 (Permanent Stent) ×1 IMPLANT
TRANSDUCER W/STOPCOCK (MISCELLANEOUS) ×2 IMPLANT
TRAY PV CATH (CUSTOM PROCEDURE TRAY) ×2 IMPLANT
WIRE TORQFLEX AUST .018X40CM (WIRE) ×1 IMPLANT

## 2018-01-19 NOTE — Progress Notes (Signed)
  Progress Note    01/19/2018 10:35 AM * No surgery date entered *  Subjective: Left leg still painful and edematous  Vitals:   01/18/18 2200 01/19/18 0420  BP: 112/75 137/69  Pulse: 86 71  Resp:  18  Temp: 98.2 F (36.8 C) 98 F (36.7 C)  SpO2: 98% 99%    Physical Exam: Awake alert oriented Nonlabored respirations Left lower extremity severely edematous  CBC    Component Value Date/Time   WBC 4.2 01/19/2018 0504   RBC 4.13 01/19/2018 0504   HGB 11.1 (L) 01/19/2018 0504   HCT 36.2 01/19/2018 0504   PLT 269 01/19/2018 0504   MCV 87.7 01/19/2018 0504   MCH 26.9 01/19/2018 0504   MCHC 30.7 01/19/2018 0504   RDW 13.4 01/19/2018 0504   LYMPHSABS 1.6 01/18/2018 1902   MONOABS 0.5 01/18/2018 1902   EOSABS 0.1 01/18/2018 1902   BASOSABS 0.0 01/18/2018 1902    BMET    Component Value Date/Time   NA 138 01/19/2018 0504   K 3.9 01/19/2018 0504   CL 104 01/19/2018 0504   CO2 28 01/19/2018 0504   GLUCOSE 104 (H) 01/19/2018 0504   BUN 12 01/19/2018 0504   CREATININE 0.86 01/19/2018 0504   CALCIUM 9.1 01/19/2018 0504   GFRNONAA >60 01/19/2018 0504   GFRAA >60 01/19/2018 0504    INR    Component Value Date/Time   INR 1.25 07/04/2014 0703     Intake/Output Summary (Last 24 hours) at 01/19/2018 1035 Last data filed at 01/19/2018 0751 Gross per 24 hour  Intake 120 ml  Output 450 ml  Net -330 ml     Assessment:  72 y.o. female is here with persistent left lower extremity swelling despite Xarelto after 6 weeks of treatment.  I suspect there is central stenosis component to this.  Plan: PV lab today for possible endovascular intervention which will include possible thrombectomy and possible lytic therapy treatment overnight.   Anjuli Gemmill C. Donzetta Matters, MD [facility] Office: [phone] Pager: [phone]  01/19/2018 10:35 AM

## 2018-01-19 NOTE — Progress Notes (Signed)
ANTICOAGULATION CONSULT NOTE Pharmacy Consult for heparin  Indication: DVT  No Known Allergies  Patient Measurements: Height: 5\' 1"  (154.9 cm) Weight: 249 lb (112.9 kg) IBW/kg (Calculated) : 47.8  Heparin dosing wt: 76kg   Vital Signs: Temp: 98 F (36.7 C) (12/02 0420) Temp Source: Oral (12/02 0420) BP: 114/43 (12/02 1209) Pulse Rate: 0 (12/02 1229)  Labs: Recent Labs    01/18/18 1902 01/19/18 0504  HGB 11.1* 11.1*  HCT 36.9 36.2  PLT 285 269  APTT  --  44*  HEPARINUNFRC  --  >2.20*  CREATININE 1.00 0.86    Estimated Creatinine Clearance: 68.9 mL/min (by C-G formula based on SCr of 0.86 mg/dL).   Medical History: Past Medical History:  Diagnosis Date  . Acute upper respiratory infection 07/06/2014  . Anemia   . Arthritis    Back   . Colon polyp    Tubular Adenoma   . Cough productive of clear sputum 06/22/2014  . GERD (gastroesophageal reflux disease)   . History of right bundle branch block (RBBB)   . HOH (hard of hearing)   . Hypertension    had in the past, is no longer on medication for this and blood pressures are WNL  . 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 Primary localized osteoarthritis of right knee      Assessment: 72 yo female with DVT noted on xarelto PTA (last dose 12/1 at 9am). S/p vascular procedure 12/2 with stent placed in L common and external iliac veins. Pharmacy consulted to resume heparin 4hrs post-sheath removal - removed at 1204 per procedure log with no bleeding issues. Heparin was increased to 1400 units/hr this AM for low aPTT, pt went to OR prior to repeat level. CBC stable.  Monitoring aPTTs while Xarelto influencing heparin levels.  Goal of Therapy:  Heparin level 0.3-0.7 units/ml aPTT 66-102 seconds Monitor platelets by anticoagulation protocol: Yes   Plan:   Resume heparin at previous rate 1400 units/hr at 1600 (4hrs post-sheath removal) 8h aPTT from resumption Monitor daily heparin level/aPTT/CBC, s/sx bleeding F/u Vascular  plans  Elicia Lamp, PharmD, BCPS Clinical Pharmacist Clinical phone [phone] Please check AMION for all [facility] contact numbers 01/19/2018 12:40 PM

## 2018-01-19 NOTE — Progress Notes (Signed)
ANTICOAGULATION CONSULT NOTE Pharmacy Consult for heparin  Indication: DVT  No Known Allergies  Patient Measurements: Height: 5\' 1"  (154.9 cm) Weight: 249 lb (112.9 kg) IBW/kg (Calculated) : 47.8  Heparin dosing wt: 76kg   Vital Signs: Temp: 98 F (36.7 C) (12/02 0420) Temp Source: Oral (12/02 0420) BP: 137/69 (12/02 0420) Pulse Rate: 71 (12/02 0420)  Labs: Recent Labs    01/18/18 1902 01/19/18 0504  HGB 11.1* 11.1*  HCT 36.9 36.2  PLT 285 269  APTT  --  44*  HEPARINUNFRC  --  >2.20*  CREATININE 1.00 0.86    Estimated Creatinine Clearance: 68.9 mL/min (by C-G formula based on SCr of 0.86 mg/dL).   Medical History: Past Medical History:  Diagnosis Date  . Acute upper respiratory infection 07/06/2014  . Anemia   . Arthritis    Back   . Colon polyp    Tubular Adenoma   . Cough productive of clear sputum 06/22/2014  . GERD (gastroesophageal reflux disease)   . History of right bundle branch block (RBBB)   . HOH (hard of hearing)   . Hypertension    had in the past, is no longer on medication for this and blood pressures are WNL  . 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 Primary localized osteoarthritis of right knee      Assessment: 72 yo female with DVT noted on xarelto PTA (last dose 12/1 at 9am). Pharmacy consulted to dose heparin  -Hg= 11.1, plt= 285 Initial heparin level >2.2, aPTT 44 sec  Goal of Therapy:  Heparin level 0.3-0.7 units/ml aPTT 66-102 seconds Monitor platelets by anticoagulation protocol: Yes   Plan:   -Increase heparin to 1400 units/hr -aptt/heparin level in 6 hours and daily wth CBC daily  Thanks for allowing pharmacy to be a part of this patient's care.  Excell Seltzer, PharmD Clinical Pharmacist

## 2018-01-19 NOTE — Op Note (Signed)
    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 Donzetta Matters, MD Procedure Performed: 1.  Ultrasound-guided cannulation left small saphenous vein 2.  Left lower extremity and central venography 3.  Intravascular ultrasound of left popliteal, femoral, common femoral, external and common iliac veins and IVC 4.  Stent of left common and external iliac veins with 14 x 60 mm Vici 5.  Moderate sedation with fentanyl and Versed for 50 minutes  Indications: 72 year old female with a history of DVT in October now presents with persistent left lower extremity swelling and ultrasound demonstrating likely persistent DVT.  She has been on Xarelto at this time.  She is now indicated for venogram possible intervention.  Findings: Flow in the left lower extremity was stagnant throughout but by venogram all veins were patent.  There was a focal occlusive area approximately 2 cm in length at the common and external iliac vein junction at the hypogastric on the left.  After stenting and ballooning we had a diameter of 12 millimeters in the stent and venogram demonstrated flow in the lower extremity veins were previously was stagnant and no further residual stenosis in the left common and external iliac vein junction.   Procedure:  The patient was identified in the holding area and taken to room 8.  The patient was then placed supine on the table and prepped and draped in the usual sterile fashion.  A time out was called.  Ultrasound was used to evaluate the left small saphenous vein the area was anesthetized with 1% lidocaine.  An image was saved the permanent record.  This was cannulated with micropuncture needle followed by wire and micro puncture sheath.  I placed a J-wire followed by 5 French sheath.  Patient was heparinized with 10,000 units of heparin.  We  then used a Glidewire advantage and bare catheter to traverse up to the level of the external iliac vein on the left.  There we had wire hanging up.  Venogram demonstrated occlusive disease.  We were able to cross into the IVC and confirmed intraluminal access.  Wire was placed into the right innominate vein under fluoroscopic guidance.  We then performed intravascular ultrasound from the popliteal vein to the IVC with the above findings.  We then marked the area for stenting and a 14 x 60 mm Vici stent was deployed.  This was postdilated initially with 12 mm balloon followed by 40 mm balloon.  Had a diameter of 12 mm by intravascular ultrasound.  Repeat venogram demonstrated brisk flow throughout the left lower extremity.  There was no further rouleaux flow on the ultrasound.  Satisfied with this we removed our catheter wire.  Sheath was pulled pressure held until hemostasis obtained.  Patient tolerated procedure well without immediate complication.  Contrast: 35 cc   Saniyah Mondesir C. Donzetta Matters, MD [facility] Office: [phone] Pager: [phone]

## 2018-01-20 LAB — CBC
HCT: 34.4 % — ABNORMAL LOW (ref 36.0–46.0)
Hemoglobin: 10.6 g/dL — ABNORMAL LOW (ref 12.0–15.0)
MCH: 27.4 pg (ref 26.0–34.0)
MCHC: 30.8 g/dL (ref 30.0–36.0)
MCV: 88.9 fL (ref 80.0–100.0)
Platelets: 260 10*3/uL (ref 150–400)
RBC: 3.87 MIL/uL (ref 3.87–5.11)
RDW: 13.5 % (ref 11.5–15.5)
WBC: 4.9 10*3/uL (ref 4.0–10.5)
nRBC: 0 % (ref 0.0–0.2)

## 2018-01-20 LAB — BASIC METABOLIC PANEL
Anion gap: 9 (ref 5–15)
BUN: 7 mg/dL — ABNORMAL LOW (ref 8–23)
CO2: 22 mmol/L (ref 22–32)
Calcium: 8.8 mg/dL — ABNORMAL LOW (ref 8.9–10.3)
Chloride: 104 mmol/L (ref 98–111)
Creatinine, Ser: 0.8 mg/dL (ref 0.44–1.00)
GFR calc Af Amer: >60 mL/min (ref >60)
GFR calc non Af Amer: >60 mL/min (ref >60)
Glucose, Bld: 100 mg/dL — ABNORMAL HIGH (ref 70–99)
Potassium: 4 mmol/L (ref 3.5–5.1)
Sodium: 135 mmol/L (ref 135–145)

## 2018-01-20 LAB — HEPARIN LEVEL (UNFRACTIONATED): Heparin Unfractionated: 0.51 IU/mL (ref 0.30–0.70)

## 2018-01-20 LAB — APTT
aPTT: 69 seconds — ABNORMAL HIGH (ref 24–36)
aPTT: 49 seconds — ABNORMAL HIGH (ref 24–36)

## 2018-01-20 MED ORDER — CLOPIDOGREL BISULFATE 75 MG PO TABS: 75.0000 mg | ORAL_TABLET | Freq: Every day | ORAL | 11 refills | Status: DC

## 2018-01-20 MED ORDER — RIVAROXABAN 15 MG PO TABS
15.0000 mg | ORAL_TABLET | Freq: Two times a day (BID) | ORAL | Status: DC
  Administered 2018-01-20 – 2018-01-21 (×3): 15 mg via ORAL
  Filled 2018-01-20 (×3): qty 1

## 2018-01-20 MED ORDER — RIVAROXABAN 20 MG PO TABS: 20.0000 mg | ORAL_TABLET | Freq: Every day | ORAL | Status: DC

## 2018-01-20 MED ORDER — RIVAROXABAN (XARELTO) VTE STARTER PACK (15 & 20 MG): ORAL_TABLET | ORAL | 0 refills | Status: DC

## 2018-01-20 MED FILL — CLOPIDOGREL 75 MG TABLET: 75 | 30 days supply | Qty: 30 | Fill #0

## 2018-01-20 MED FILL — Lidocaine HCl Local Preservative Free (PF) Inj 1%: INTRAMUSCULAR | Qty: 30 | Status: AC

## 2018-01-20 NOTE — Progress Notes (Addendum)
    Subjective  - Doing better with less swelling in the left LE   Objective 133/69 78 98 F (36.7 C) (Oral) 12 94%  Intake/Output Summary (Last 24 hours) at 01/20/2018 0731 Last data filed at 01/20/2018 0000 Gross per 24 hour  Intake 1117.7 ml  Output 550 ml  Net 567.7 ml   Left LE edema decreased compared to prior to surgery     Assessment/Planning: POD # 1Stent of left common and external iliac veins with 14 x 60 mm Vici   Plan to order left LE knee high compression Restart Xarelto, saline lock fluids PT eval and treat Plan for discharge tomorrow  [name] 01/20/2018 7:31 AM --  Laboratory Lab Results: Recent Labs    01/19/18 0504 01/20/18 0352  WBC 4.2 4.9  HGB 11.1* 10.6*  HCT 36.2 34.4*  PLT 269 260   BMET Recent Labs    01/19/18 0504 01/20/18 0352  NA 138 135  K 3.9 4.0  CL 104 104  CO2 28 22  GLUCOSE 104* 100*  BUN 12 7*  CREATININE 0.86 0.80  CALCIUM 9.1 8.8*    COAG Lab Results  Component Value Date   INR 1.25 07/04/2014   INR 1.28 06/22/2014   INR 1.19 04/25/2011   No results found for: PTT   I have interviewed and examined patient with PA and agree with assessment and plan above.   Deniesha Stenglein C. Donzetta Matters, MD [facility] Office: [phone] Pager: [phone]

## 2018-01-20 NOTE — Progress Notes (Signed)
[facility] for heparin  Indication: DVT  No Known Allergies  Patient Measurements: Height: 5\' 1"  (154.9 cm) Weight: 249 lb (112.9 kg) IBW/kg (Calculated) : 47.8  Heparin dosing wt: 76kg   Vital Signs: Temp: 98 F (36.7 C) (12/03 0440) Temp Source: Oral (12/03 0440) BP: 133/69 (12/03 0440) Pulse Rate: 78 (12/03 0440)  Labs: Recent Labs    01/18/18 1902 01/19/18 0504 01/20/18 0017 01/20/18 0352  HGB 11.1* 11.1*  --  10.6*  HCT 36.9 36.2  --  34.4*  PLT 285 269  --  260  APTT  --  44* 69* 49*  HEPARINUNFRC  --  >2.20*  --  0.51  CREATININE 1.00 0.86  --  0.80    Estimated Creatinine Clearance: 74.1 mL/min (by C-G formula based on SCr of 0.8 mg/dL).   Medical History: Past Medical History:  Diagnosis Date  . Acute upper respiratory infection 07/06/2014  . Anemia   . Arthritis    Back   . Colon polyp    Tubular Adenoma   . Cough productive of clear sputum 06/22/2014  . GERD (gastroesophageal reflux disease)   . History of right bundle branch block (RBBB)   . HOH (hard of hearing)   . Hypertension    had in the past, is no longer on medication for this and blood pressures are WNL  . 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 Primary localized osteoarthritis of right knee      Assessment: 72 yo female with acute DVT noted on xarelto PTA for DVT in Oct '19 (last dose 12/1 at 9am). S/p vascular procedure 12/2 with stent placed in L common and external iliac veins. Pharmacy consulted to transition to [place]. CBC stable. SCr stable WNL. No bleed issues documented.  Goal of Therapy:  VTE treatment Monitor platelets by anticoagulation protocol: Yes   Plan:   D/c heparin at time of 1st dose of Xarelto - communicated plan with RN Xarelto 15mg  PO BID with meals x 21 days; then 20mg  PO Qsupper Monitor CBC, s/sx bleeding  Elicia Lamp, PharmD, BCPS Clinical Pharmacist Clinical phone [phone] Please check AMION for all [facility] contact  numbers 01/20/2018 8:32 AM

## 2018-01-20 NOTE — Progress Notes (Signed)
ANTICOAGULATION CONSULT NOTE - Follow Up Consult  Pharmacy Consult for heparin Indication: DVT  Labs: Recent Labs    01/18/18 1902 01/19/18 0504 01/20/18 0017  HGB 11.1* 11.1*  --   HCT 36.9 36.2  --   PLT 285 269  --   APTT  --  44* 69*  HEPARINUNFRC  --  >2.20*  --   CREATININE 1.00 0.86  --     Assessment/Plan:  72yo female therapeutic on heparin after resumed. Will continue gtt at current rate and confirm stable with additional PTT.   Wynona Neat, PharmD, BCPS  01/20/2018,1:30 AM

## 2018-01-20 NOTE — Evaluation (Signed)
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 PMH - bil TKR, arthritis, obesity  Clinical Impression  Pt doing well with mobility and no further PT needed.  Recommend rollator for incr access to the community.      Follow Up Recommendations No PT follow up    Equipment Recommendations  Other (comment)(rollator)    Recommendations for Other Services       Precautions / Restrictions Precautions Precautions: None Restrictions Weight Bearing Restrictions: No      Mobility  Bed Mobility Overal bed mobility: Modified Independent                Transfers Overall transfer level: Modified independent Equipment used: None;4-wheeled walker                Ambulation/Gait Ambulation/Gait assistance: Modified independent (Device/Increase time) Gait Distance (Feet): 200 Feet Assistive device: 4-wheeled walker Gait Pattern/deviations: Step-through pattern;Decreased stride length;Antalgic Gait velocity: decr Gait velocity interpretation: 1.31 - 2.62 ft/sec, indicative of limited community ambulator General Gait Details: Steady gait with rollator  Stairs            Wheelchair Mobility    Modified Rankin (Stroke Patients Only)       Balance Overall balance assessment: Mild deficits observed, not formally tested                                           Pertinent Vitals/Pain Pain Assessment: 0-10 Pain Score: 7  Pain Location: LLE Pain Descriptors / Indicators: Aching Pain Intervention(s): Monitored during session;Premedicated before session    Home Living Family/patient expects to be discharged to:: Private residence Living Arrangements: Alone Available Help at Discharge: Family;Available  PRN/intermittently Type of Home: House Home Access: Stairs to enter Entrance Stairs-Rails: Right Entrance Stairs-Number of Steps: 3-4 Home Layout: One level Home Equipment: Walker - 2 wheels;Cane - single point      Prior Function Level of Independence: Independent with assistive device(s)         Comments: uses a cane when out     Hand Dominance   Dominant Hand: Right    Extremity/Trunk Assessment   Upper Extremity Assessment Upper Extremity Assessment: Overall WFL for tasks assessed    Lower Extremity Assessment Lower Extremity Assessment: Overall WFL for tasks assessed       Communication   Communication: No difficulties  Cognition Arousal/Alertness: Awake/alert Behavior During Therapy: WFL for tasks assessed/performed Overall Cognitive Status: Within Functional Limits for tasks assessed                                        General Comments      Exercises     Assessment/Plan    PT Assessment Patent does not need any further PT services  PT Problem List         PT Treatment Interventions      PT Goals (Current goals can be found in the Care Plan section)  Acute Rehab PT Goals PT Goal Formulation: All assessment and education complete, DC therapy    Frequency     Barriers to discharge  Co-evaluation               AM-PAC PT "6 Clicks" Mobility  Outcome Measure Help needed turning from your back to your side while in a flat bed without using bedrails?: None Help needed moving from lying on your back to sitting on the side of a flat bed without using bedrails?: None Help needed moving to and from a bed to a chair (including a wheelchair)?: None Help needed standing up from a chair using your arms (e.g., wheelchair or bedside chair)?: None Help needed to walk in hospital room?: None Help needed climbing 3-5 steps with a railing? : None 6 Click Score: 24    End of Session   Activity Tolerance: Patient tolerated  treatment well Patient left: in bed;with call bell/phone within reach(sitting) Nurse Communication: Mobility status PT Visit Diagnosis: Pain Pain - Right/Left: Left Pain - part of body: Leg    Time: 1211-1222 PT Time Calculation (min) (ACUTE ONLY): 11 min   Charges:   PT Evaluation $PT Eval Low Complexity: 1 Low          [place] Pager [phone] Office [place] 01/20/2018, 12:36 PM

## 2018-01-20 NOTE — Discharge Instructions (Addendum)
Information on my medicine - XARELTO (rivaroxaban)  This medication education was reviewed with me or my healthcare representative as part of my discharge preparation.  WHY WAS XARELTO PRESCRIBED FOR YOU? Xarelto was prescribed to treat blood clots that may have been found in the veins of your legs (deep vein thrombosis) or in your lungs (pulmonary embolism) and to reduce the risk of them occurring again.  What do you need to know about Xarelto? The starting dose is one 15 mg tablet taken TWICE daily with food for the FIRST 21 DAYS then on (enter date)  02/10/2018  the dose is changed to one 20 mg tablet taken ONCE A DAY with your evening meal.  DO NOT stop taking Xarelto without talking to the health care provider who prescribed the medication.  Refill your prescription for 20 mg tablets before you run out.  After discharge, you should have regular check-up appointments with your healthcare provider that is prescribing your Xarelto.  In the future your dose may need to be changed if your kidney function changes by a significant amount.  What do you do if you miss a dose? If you are taking Xarelto TWICE DAILY and you miss a dose, take it as soon as you remember. You may take two 15 mg tablets (total 30 mg) at the same time then resume your regularly scheduled 15 mg twice daily the next day.  If you are taking Xarelto ONCE DAILY and you miss a dose, take it as soon as you remember on the same day then continue your regularly scheduled once daily regimen the next day. Do not take two doses of Xarelto at the same time.   Important Safety Information Xarelto is a blood thinner medicine that can cause bleeding. You should call your healthcare provider right away if you experience any of the following: ? Bleeding from an injury or your nose that does not stop. ? Unusual colored urine (red or dark brown) or unusual colored stools (red or black). ? Unusual bruising for unknown reasons. ? A  serious fall or if you hit your head (even if there is no bleeding).  Some medicines may interact with Xarelto and might increase your risk of bleeding while on Xarelto. To help avoid this, consult your healthcare provider or pharmacist prior to using any new prescription or non-prescription medications, including herbals, vitamins, non-steroidal anti-inflammatory drugs (NSAIDs) and supplements.  This website has more information on Xarelto: [email or web address]    [facility]  Discharge Instructions  Lower Extremity Angiogram; Angioplasty/Stenting  Please refer to the following instructions for your post-procedure care. Your surgeon or physician assistant will discuss any changes with you.  Activity  Avoid lifting more than 8 pounds (1 gallons of milk) for 72 hours (3 days) after your procedure. You may walk as much as you can tolerate. It's OK to drive after 72 hours.    Continue to keep legs elevated when not walking.  Wear compression stockings daily-put on when you wake up and take them off when you go to bed.  You do not need to sleep in the compression stockings.  Bathing/Showering  You may shower the day after your procedure. If you have a bandage, you may remove it at 24- 48 hours. Clean your incision site with mild soap and water. Pat the area dry with a clean towel.  Diet  Resume your pre-procedure diet. There are no special food restrictions following this procedure. All patients with peripheral vascular  disease should follow a low fat/low cholesterol diet. In order to heal from your surgery, it is CRITICAL to get adequate nutrition. Your body requires vitamins, minerals, and protein. Vegetables are the best source of vitamins and minerals. Vegetables also provide the perfect balance of protein. Processed food has little nutritional value, so try to avoid this.  Medications  Resume taking all of your medications unless your doctor tells you  not to. If your incision is causing pain, you may take over-the-counter pain relievers such as acetaminophen (Tylenol)  Follow Up  Follow up will be arranged at the time of your procedure. You may have an office visit scheduled or may be scheduled for surgery. Ask your surgeon if you have any questions.  Please call us immediately for any of the following conditions: Severe or worsening pain your legs or feet at rest or with walking. Increased pain, redness, drainage at your groin puncture site. Fever of 101 degrees or higher. If you have any mild or slow bleeding from your puncture site: lie down, apply firm constant pressure over the area with a piece of gauze or a clean wash cloth for 30 minutes- no peeking!, call 911 right away if you are still bleeding after 30 minutes, or if the bleeding is heavy and unmanageable.  Reduce your risk factors of vascular disease:   Stop smoking. If you would like help call [facility] at 1-800-QUIT-NOW 250-011-2439) or [place] at (940)705-5328.  Manage your cholesterol  Maintain a desired weight  Control your diabetes  Keep your blood pressure down   If you have any questions, please call the office at [phone]

## 2018-01-21 NOTE — Care Management Note (Signed)
Case Management Note Marvetta Gibbons RN, BSN Transitions of Care Unit 4E- RN Case Manager [phone]  Patient Details  Name: [name] MRN: [id number] Date of Birth: 1945-03-14  Subjective/Objective:    Pt admitted with hx of DVT,   S/p stent of left common and external iliac veins               Action/Plan: PTA pt lived at home, plan to re-load with [facility] to fill prior to discharge and deliver to bedside- per PT pt could benefit from rollator - order has been placed- call made to Butch Penny with [facility] for DME needs- Rollator to be delivered to room prior to discharge.   Expected Discharge Date:  01/21/18               Expected Discharge Plan:  Home/Self Care  In-House Referral:  NA  Discharge planning Services  CM Consult  Post Acute Care Choice:  Durable Medical Equipment Choice offered to:  Patient  DME Arranged:  Walker rolling with seat DME Agency:  [place] Arranged:  NA [place] Agency:  NA  Status of Service:  Completed, signed off  If discussed at [place] of Stay Meetings, dates discussed:    Discharge Disposition: home/self care   Additional Comments:  Dawayne Patricia, RN 01/21/2018, 11:43 AM

## 2018-01-21 NOTE — Progress Notes (Signed)
D/C instructions and prescription given to pt. Rollator delivered to room. IV removed, clean and intact. Telemetry removed. Son to escort pt home.   Clyde Canterbury, RN

## 2018-01-21 NOTE — Progress Notes (Addendum)
  Progress Note    01/21/2018 8:02 AM 2 Days Post-Op  Subjective:  Feels better; wants to go home  Afebrile HR 60's-90's NSR 680'S-811'S systolic 31'R RA  Vitals:   01/20/18 1936 01/21/18 0507  BP: 115/71 121/61  Pulse: 81   Resp: 18 13  Temp: 97.9 F (36.6 C) 98.2 F (36.8 C)  SpO2: 96%     Physical Exam: General:  No distress Lungs:  Non laboared Incisions:  Posterior knee looks fine Extremities:  Still with some swelling LLE, however pt states it is improved.     CBC    Component Value Date/Time   WBC 4.9 01/20/2018 0352   RBC 3.87 01/20/2018 0352   HGB 10.6 (L) 01/20/2018 0352   HCT 34.4 (L) 01/20/2018 0352   PLT 260 01/20/2018 0352   MCV 88.9 01/20/2018 0352   MCH 27.4 01/20/2018 0352   MCHC 30.8 01/20/2018 0352   RDW 13.5 01/20/2018 0352   LYMPHSABS 1.6 01/18/2018 1902   MONOABS 0.5 01/18/2018 1902   EOSABS 0.1 01/18/2018 1902   BASOSABS 0.0 01/18/2018 1902    BMET    Component Value Date/Time   NA 135 01/20/2018 0352   K 4.0 01/20/2018 0352   CL 104 01/20/2018 0352   CO2 22 01/20/2018 0352   GLUCOSE 100 (H) 01/20/2018 0352   BUN 7 (L) 01/20/2018 0352   CREATININE 0.80 01/20/2018 0352   CALCIUM 8.8 (L) 01/20/2018 0352   GFRNONAA >60 01/20/2018 0352   GFRAA >60 01/20/2018 0352    INR    Component Value Date/Time   INR 1.25 07/04/2014 0703     Intake/Output Summary (Last 24 hours) at 01/21/2018 0802 Last data filed at 01/20/2018 2200 Gross per 24 hour  Intake 120 ml  Output -  Net 120 ml     Assessment:  72 y.o. female is s/p:  Stent of left common and external iliac veins with 14 x 60 mm Vici  2 Days Post-Op  Plan: -pt feeling much better -PT does not recommend any further tx -DVT prophylaxis:  Xarelto- Rx per transitional pharmacy as well as Plavix. -pt needs to be wearing compression -discharge home today   Leontine Locket, [place] [facility] [phone] 01/21/2018 8:02 AM  I have independently  interviewed and examined patient and agree with PA assessment and plan above.   Carianne Taira C. Donzetta Matters, MD [facility] [place] Office: [phone] Pager: [phone]

## 2018-01-21 NOTE — Discharge Summary (Addendum)
Discharge Summary    [name] [birth year] 72 y.o. female  [id number]  Admission Date: 01/18/2018  Discharge Date: 01/21/18  Physician: Thomes Lolling*  Admission Diagnosis: Acute deep vein thrombosis (DVT) of left lower extremity, unspecified vein (HCC) [I82.402]   HPI:   This is a 72 y.o. female without significant history of lower extremity issues except bilateral knee replacements.  Presented in October with left lower extremity swelling was found to have DVT including her femoral vein started on Xarelto.  Since that time she has had difficulty walking with persistent swelling.  States she has had no improvement.  She does not have any ulceration.  Prior to the last DVT she did not note any trauma.  She has not taken any long trips had not been ill.  No history of DVT in either her or family.  She has no history of bleeding denies any history of stroke and has not had bleeding issues on Xarelto.  Return to ED today with persistent pain swelling and heaviness to left lower extremity.  Hospital Course:  The patient was admitted to the hospital and taken to the operating room on 01/19/2018 and underwent: Procedure Performed: 1.  Ultrasound-guided cannulation left small saphenous vein 2.  Left lower extremity and central venography 3.  Intravascular ultrasound of left popliteal, femoral, common femoral, external and common iliac veins and IVC 4.  Stent of left common and external iliac veins with 14 x 60 mm Vici 5.  Moderate sedation with fentanyl and Versed for 50 minutes   Findings: Flow in the left lower extremity was stagnant throughout but by venogram all veins were patent.  There was a focal occlusive area approximately 2 cm in length at the common and external iliac vein junction at the hypogastric on the left.  After stenting and ballooning we had a diameter of 12 millimeters in the stent and venogram demonstrated flow in the lower extremity veins were previously  was stagnant and no further residual stenosis in the left common and external iliac vein junction.   The pt tolerated the procedure well and was transported to the PACU in good condition.   By POD 1, she was doing much better with decreased edema in the LLE compared to before surgery.  She was started on Xarelto and compression sock was ordered.   By POD 2, she continues to do well.  She is tolerating Xarelto and Plavix.  She will be discharged home today with instruction of leg elevation and compression.   The remainder of the hospital course consisted of increasing mobilization and increasing intake of solids without difficulty.  CBC    Component Value Date/Time   WBC 4.9 01/20/2018 0352   RBC 3.87 01/20/2018 0352   HGB 10.6 (L) 01/20/2018 0352   HCT 34.4 (L) 01/20/2018 0352   PLT 260 01/20/2018 0352   MCV 88.9 01/20/2018 0352   MCH 27.4 01/20/2018 0352   MCHC 30.8 01/20/2018 0352   RDW 13.5 01/20/2018 0352   LYMPHSABS 1.6 01/18/2018 1902   MONOABS 0.5 01/18/2018 1902   EOSABS 0.1 01/18/2018 1902   BASOSABS 0.0 01/18/2018 1902    BMET    Component Value Date/Time   NA 135 01/20/2018 0352   K 4.0 01/20/2018 0352   CL 104 01/20/2018 0352   CO2 22 01/20/2018 0352   GLUCOSE 100 (H) 01/20/2018 0352   BUN 7 (L) 01/20/2018 0352   CREATININE 0.80 01/20/2018 0352   CALCIUM 8.8 (L) 01/20/2018 4098  GFRNONAA >60 01/20/2018 0352   GFRAA >60 01/20/2018 0352      Discharge Instructions    Discharge patient   Complete by:  As directed    Discharge home once Dr. Donzetta Matters has seen the pt.  Thanks.  PLEASE MAKE SURE PT HAS HER RX FROM TRANSITIONAL PHARMACY BEFORE DISCHARGE. THANKS!   Discharge disposition:  01-Home or Self Care   Discharge patient date:  01/21/2018      Discharge Diagnosis:  Acute deep vein thrombosis (DVT) of left lower extremity, unspecified vein (HCC) [I82.402]  Secondary Diagnosis: Patient Active Problem List   Diagnosis Date Noted  . DVT (deep venous  thrombosis) ([place]) 01/18/2018  . Acute upper respiratory infection 07/06/2014  . 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 DJD (degenerative joint disease) of knee 07/04/2014  . Primary localized osteoarthritis of right knee   . Arthritis   . Anemia   . GERD (gastroesophageal reflux disease)   . Colon polyp   . HOH (hard of hearing)   . Postoperative anemia due to acute blood loss 05/01/2011  . 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   Past Medical History:  Diagnosis Date  . Acute upper respiratory infection 07/06/2014  . Anemia   . Arthritis    Back   . Colon polyp    Tubular Adenoma   . Cough productive of clear sputum 06/22/2014  . GERD (gastroesophageal reflux disease)   . History of right bundle branch block (RBBB)   . HOH (hard of hearing)   . Hypertension    had in the past, is no longer on medication for this and blood pressures are WNL  . 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 Primary localized osteoarthritis of right knee      Allergies as of 01/21/2018   No Known Allergies     Medication List    STOP taking these medications   acetaminophen 325 MG tablet Commonly known as:  TYLENOL   traMADol 50 MG tablet Commonly known as:  ULTRAM     TAKE these medications   acetaminophen-codeine 300-30 MG tablet Commonly known as:  TYLENOL #3 Take 1 tablet by mouth every 6 (six) hours as needed for moderate pain.   clopidogrel 75 MG tablet Commonly known as:  PLAVIX Take 1 tablet (75 mg total) by mouth daily with breakfast.   cycloSPORINE 0.05 % ophthalmic emulsion Commonly known as:  RESTASIS Place 1 drop into both eyes 2 (two) times daily.   furosemide 40 MG tablet Commonly known as:  LASIX Take 40 mg by mouth daily.   Olopatadine HCl 0.2 % Soln Place 1 drop into both eyes daily.   potassium chloride 10 MEQ tablet Commonly known as:  K-DUR Take 10 mEq by mouth daily.   rivaroxaban 20 MG Tabs tablet Commonly known as:  XARELTO Take 20 mg by mouth  daily with breakfast.   Rivaroxaban 15 & 20 MG Tbpk Take as directed on package: Start with one 15mg  tablet by mouth twice a day with food. On Day 22, switch to one 20mg  tablet once a day with food.            Durable Medical Equipment  (From admission, onward)         Start     Ordered   01/20/18 1245  For home use only DME 4 wheeled rolling walker with seat  Once    Question:  Patient needs a walker to treat with the following condition  Answer:  Weakness  generalized   01/20/18 1245          Prescriptions given: 1.  Xarelto starter pack  2.  Xarelto 20mg  daily 3.  Plavix 75mg  daily  Instructions: Information on my medicine - XARELTO (rivaroxaban)  This medication education was reviewed with me or my healthcare representative as part of my discharge preparation.  WHY WAS XARELTO PRESCRIBED FOR YOU? Xarelto was prescribed to treat blood clots that may have been found in the veins of your legs (deep vein thrombosis) or in your lungs (pulmonary embolism) and to reduce the risk of them occurring again.  WHAT DO YOU NEED TO KNOW ABOUT XARELTO? The starting dose is one 15 mg tablet taken TWICE daily with food for the FIRST 21 DAYS then on (enter date)  02/10/2018  the dose is changed to one 20 mg tablet taken ONCE A DAY with your evening meal.  DO NOT stop taking Xarelto without talking to the health care provider who prescribed the medication.  Refill your prescription for 20 mg tablets before you run out.  After discharge, you should have regular check-up appointments with your healthcare provider that is prescribing your Xarelto.  In the future your dose may need to be changed if your kidney function changes by a significant amount.  WHAT DO YOU DO IF YOU MISS A DOSE? If you are taking Xarelto TWICE DAILY and you miss a dose, take it as soon as you remember. You may take two 15 mg tablets (total 30 mg) at the same time then resume your regularly scheduled 15 mg  twice daily the next day.  If you are taking Xarelto ONCE DAILY and you miss a dose, take it as soon as you remember on the same day then continue your regularly scheduled once daily regimen the next day. Do not take two doses of Xarelto at the same time.   IMPORTANT SAFETY INFORMATION Xarelto is a blood thinner medicine that can cause bleeding. You should call your healthcare provider right away if you experience any of the following:  Bleeding from an injury or your nose that does not stop.  Unusual colored urine (red or dark brown) or unusual colored stools (red or black).  Unusual bruising for unknown reasons.  A serious fall or if you hit your head (even if there is no bleeding).  Some medicines may interact with Xarelto and might increase your risk of bleeding while on Xarelto. To help avoid this, consult your healthcare provider or pharmacist prior to using any new prescription or non-prescription medications, including herbals, vitamins, non-steroidal anti-inflammatory drugs (NSAIDs) and supplements.  This website has more information on Xarelto: [email or web address]    [facility]  Discharge Instructions  Lower Extremity Angiogram; Angioplasty/Stenting  Please refer to the following instructions for your post-procedure care. Your surgeon or physician assistant will discuss any changes with you.  Activity  Avoid lifting more than 8 pounds (1 gallons of milk) for 72 hours (3 days) after your procedure. You may walk as much as you can tolerate. It's OK to drive after 72 hours.    Continue to keep legs elevated when not walking.  Wear compression stockings daily-put on when you wake up and take them off when you go to bed.  You do not need to sleep in the compression stockings.  Bathing/Showering  You may shower the day after your procedure. If you have a bandage, you may remove it at 24- 48 hours. Clean your incision site  with  mild soap and water. Pat the area dry with a clean towel.  Diet  Resume your pre-procedure diet. There are no special food restrictions following this procedure. All patients with peripheral vascular disease should follow a low fat/low cholesterol diet. In order to heal from your surgery, it is CRITICAL to get adequate nutrition. Your body requires vitamins, minerals, and protein. Vegetables are the best source of vitamins and minerals. Vegetables also provide the perfect balance of protein. Processed food has little nutritional value, so try to avoid this.  Medications  Resume taking all of your medications unless your doctor tells you not to. If your incision is causing pain, you may take over-the-counter pain relievers such as acetaminophen (Tylenol)  Follow Up  Follow up will be arranged at the time of your procedure. You may have an office visit scheduled or may be scheduled for surgery. Ask your surgeon if you have any questions.  Please call us immediately for any of the following conditions: .Severe or worsening pain your legs or feet at rest or with walking. .Increased pain, redness, drainage at your groin puncture site. .Fever of 101 degrees or higher. .If you have any mild or slow bleeding from your puncture site: lie down, apply firm constant pressure over the area with a piece of gauze or a clean wash cloth for 30 minutes- no peeking!, call 911 right away if you are still bleeding after 30 minutes, or if the bleeding is heavy and unmanageable.  Reduce your risk factors of vascular disease:   Stop smoking. If you would like help call [facility] at 1-800-QUIT-NOW 8073619600) or [place] at 7407876479.  Manage your cholesterol  Maintain a desired weight  Control your diabetes  Keep your blood pressure down   If you have any questions, please call the office at [phone]  Disposition: home  Patient's condition: is Good  Follow up: 1. Dr. Donzetta Matters  in 4 weeks with IVC and iliac duplex   Leontine Locket, PA-C [facility] [phone] 01/21/2018  8:27 AM

## 2018-01-23 ENCOUNTER — Telehealth: Payer: Self-pay | Admitting: Vascular Surgery

## 2018-01-23 NOTE — Telephone Encounter (Signed)
-----   Message from Gabriel Earing, [place] sent at 01/21/2018  8:21 AM EST ----- S.p Stent of left common and external iliac veins with 14 x 60 mm Vici   F/u with Dr. Donzetta Matters in 4 weeks with IVC and iliac duplex.  Thanks

## 2018-01-23 NOTE — Telephone Encounter (Signed)
sch appt spk to pt mld ltr 02/20/2018 8am IVC/iliac 845am p/o MD

## 2018-01-29 ENCOUNTER — Other Ambulatory Visit: Payer: Self-pay

## 2018-01-29 DIAGNOSIS — I82422 Acute embolism and thrombosis of left iliac vein: Secondary | ICD-10-CM

## 2018-01-29 DIAGNOSIS — Z48812 Encounter for surgical aftercare following surgery on the circulatory system: Secondary | ICD-10-CM

## 2018-02-20 ENCOUNTER — Ambulatory Visit (HOSPITAL_COMMUNITY)
Admission: RE | Admit: 2018-02-20 | Discharge: 2018-02-20 | Disposition: A | Discharge status: Home / Self Care | Payer: Medicare Other | Source: Ambulatory Visit | Attending: Vascular Surgery | Admitting: Vascular Surgery

## 2018-02-20 ENCOUNTER — Other Ambulatory Visit: Payer: Self-pay

## 2018-02-20 ENCOUNTER — Ambulatory Visit (INDEPENDENT_AMBULATORY_CARE_PROVIDER_SITE_OTHER): Payer: Medicare Other | Admitting: Vascular Surgery

## 2018-02-20 ENCOUNTER — Encounter: Payer: Self-pay | Admitting: Vascular Surgery

## 2018-02-20 VITALS — BP 137/81 | HR 80 | Temp 97.4°F | Resp 14 | Ht 61.0 in | Wt 247.0 lb

## 2018-02-20 DIAGNOSIS — I82422 Acute embolism and thrombosis of left iliac vein: Secondary | ICD-10-CM | POA: Diagnosis not present

## 2018-02-20 DIAGNOSIS — I87002 Postthrombotic syndrome without complications of left lower extremity: Secondary | ICD-10-CM

## 2018-02-20 DIAGNOSIS — Z48812 Encounter for surgical aftercare following surgery on the circulatory system: Secondary | ICD-10-CM | POA: Insufficient documentation

## 2018-02-20 NOTE — Progress Notes (Signed)
Patient ID: [name], female   DOB: April 01, 1945, 73 y.o.   MRN: [id number]  Reason for Consult: Routine Post Op   Referred by Nolene Ebbs, MD  Subjective:     HPI:  [name] is a 72 y.o. female presents for follow-up after stenting of left external iliac vein.  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  She continues on Xarelto and is now on Plavix as well.  She has not had any bleeding issues.  She continues to have swelling of the left leg although it is improved in the thigh the leg has remained similar and she has needle feelings in her foot that she did not have previously.  She also has some cramping pain around her left hip.  She has no skin changes at this time.  She is wearing her compression stockings religiously although they are not on today for her office visit.  Past Medical History:  Diagnosis Date  . Acute upper respiratory infection 07/06/2014  . Anemia   . Arthritis    Back   . Colon polyp    Tubular Adenoma   . Cough productive of clear sputum 06/22/2014  . GERD (gastroesophageal reflux disease)   . History of right bundle branch block (RBBB)   . HOH (hard of hearing)   . Hypertension    had in the past, is no longer on medication for this and blood pressures are WNL  . 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 Primary localized osteoarthritis of right knee    Family History  Problem Relation Age of Onset  . Arthritis Mother   . Hypertension Mother   . Alzheimer's disease Father   . Diabetes Sister   . Hypertension Sister   . Hypertension Brother   . Stroke Brother   . Hypertension Brother   . Hypertension Sister   . Hypertension Sister   . Hypertension Sister   . Breast cancer Other        Niece  . Anesthesia problems Neg Hx   . Hypotension Neg Hx   . Malignant hyperthermia Neg Hx   . Pseudochol deficiency Neg Hx   . Colon cancer Neg Hx    Past Surgical History:  Procedure Laterality Date  . ABDOMINAL  HYSTERECTOMY  2012  . CHOLECYSTECTOMY N/A 03/09/2013   Procedure: LAPAROSCOPIC CHOLECYSTECTOMY;  Surgeon: Gayland Curry, MD;  Location: [facility];  Service: General;  Laterality: N/A;  . COLONOSCOPY W/ BIOPSIES    . LARYNGOSCOPY Left 03/14/2017   Procedure: LARYNGOSCOPY;  Surgeon: Helayne Seminole, MD;  Location: [facility];  Service: ENT;  Laterality: Left;  . LOWER EXTREMITY VENOGRAPHY Left 01/19/2018   Procedure: LOWER EXTREMITY VENOGRAPHY;  Surgeon: Waynetta Sandy, MD;  Location: [facility];  Service: Cardiovascular;  Laterality: Left;  . PERIPHERAL VASCULAR INTERVENTION Left 01/19/2018   Procedure: PERIPHERAL VASCULAR INTERVENTION;  Surgeon: Waynetta Sandy, MD;  Location: [facility];  Service: Cardiovascular;  Laterality: Left;  LEFT ILIAC VENOUS  . TOTAL KNEE ARTHROPLASTY  04/29/2011   Procedure: TOTAL KNEE ARTHROPLASTY;  Surgeon: Lorn Junes, MD;  Location: [facility];  Service: Orthopedics;  Laterality: Left;  [place] THIS CASE  . TOTAL KNEE ARTHROPLASTY Right 07/04/2014   Procedure: TOTAL KNEE ARTHROPLASTY;  Surgeon: Elsie Saas, MD;  Location: [facility];  Service: Orthopedics;  Laterality: Right;    Short Social History:  Social  History   Tobacco Use  . Smoking status: Former Smoker    Years: 1.00    Last attempt to quit: 04/22/1988    Years since quitting: 29.8  . Smokeless tobacco: Never Used  Substance Use Topics  . Alcohol use: No    No Known Allergies  Current Outpatient Medications  Medication Sig Dispense Refill  . acetaminophen-codeine (TYLENOL #3) 300-30 MG tablet Take 1 tablet by mouth every 6 (six) hours as needed for moderate pain.    Marland Kitchen clopidogrel (PLAVIX) 75 MG tablet Take 1 tablet (75 mg total) by mouth daily with breakfast. 30 tablet 11  . cycloSPORINE (RESTASIS) 0.05 % ophthalmic emulsion Place 1 drop into both eyes 2 (two) times daily.    . furosemide (LASIX) 40 MG tablet Take 40 mg by mouth daily.    .  Olopatadine HCl 0.2 % SOLN Place 1 drop into both eyes daily.    . potassium chloride (K-DUR) 10 MEQ tablet Take 10 mEq by mouth daily.  2  . rivaroxaban (XARELTO) 20 MG TABS tablet Take 20 mg by mouth daily with breakfast.    . Rivaroxaban 15 & 20 MG TBPK Take as directed on package: Start with one 15mg  tablet by mouth twice a day with food. On Day 22, switch to one 20mg  tablet once a day with food. 51 each 0   No current facility-administered medications for this visit.     Review of Systems  Constitutional:  Constitutional negative. HENT: HENT negative.  Eyes: Eyes negative.  Respiratory: Respiratory negative.  Cardiovascular: Positive for leg swelling.  GI: Gastrointestinal negative.  Musculoskeletal: Positive for leg pain.  Skin: Skin negative.  Neurological: Neurological negative. Hematologic: Hematologic/lymphatic negative.        Objective:  Objective   Vitals:   02/20/18 0851  Resp: 14  Weight: 247 lb (112 kg)  Height: 5\' 1"  (1.549 m)   Body mass index is 46.67 kg/m.  Physical Exam HENT:     Head: Normocephalic.  Eyes:     Pupils: Pupils are equal, round, and reactive to light.  Neck:     Musculoskeletal: Normal range of motion.  Abdominal:     Palpations: Abdomen is soft. There is no mass.  Musculoskeletal: Normal range of motion.        General: Swelling present.     Comments: Significant swelling left leg below the knee that is nonpitting  Skin:    General: Skin is warm.     Capillary Refill: Capillary refill takes less than 2 seconds.  Neurological:     General: No focal deficit present.     Mental Status: She is alert.  Psychiatric:        Mood and Affect: Mood normal.        Behavior: Behavior normal.        Thought Content: Thought content normal.        Judgment: Judgment normal.     Data: I have independently interpreted her IVC iliac duplex which is limited by body habitus.  Appears the stent is patent and there is flow.       Assessment/Plan:    73 year old female follows up after stenting left external iliac vein.  She had a very interesting presentation with post thrombotic syndrome although I am not sure she ever had actual DVT rather than just stasis in all of her veins that resolved completely after stenting.  Unfortunately she still has swelling in her left lower extremity consistent with post thrombotic  syndrome.  She will remain on Xarelto given this persistent swelling and will also take Plavix out for at least 3 months as she is having no bleeding issues.  She will continue her thigh-high compression stockings that she is wearing religiously.  We will get her set up for reflux testing in 3 months.  I have discussed with her continued walking and she would also benefit from weight loss and she understands this     Waynetta Sandy MD [facility]

## 2018-04-12 ENCOUNTER — Other Ambulatory Visit: Payer: Self-pay

## 2018-04-12 ENCOUNTER — Emergency Department (HOSPITAL_BASED_OUTPATIENT_CLINIC_OR_DEPARTMENT_OTHER): Payer: Medicare Other

## 2018-04-12 ENCOUNTER — Encounter (HOSPITAL_COMMUNITY): Payer: Self-pay

## 2018-04-12 ENCOUNTER — Emergency Department (HOSPITAL_COMMUNITY)
Admission: EM | Admit: 2018-04-12 | Discharge: 2018-04-12 | Disposition: A | Discharge status: Home / Self Care | Payer: Medicare Other | Attending: Emergency Medicine | Admitting: Emergency Medicine

## 2018-04-12 ENCOUNTER — Emergency Department (HOSPITAL_COMMUNITY): Payer: Medicare Other

## 2018-04-12 DIAGNOSIS — M79605 Pain in left leg: Secondary | ICD-10-CM

## 2018-04-12 DIAGNOSIS — Z79899 Other long term (current) drug therapy: Secondary | ICD-10-CM | POA: Insufficient documentation

## 2018-04-12 DIAGNOSIS — Z87891 Personal history of nicotine dependence: Secondary | ICD-10-CM | POA: Insufficient documentation

## 2018-04-12 DIAGNOSIS — I10 Essential (primary) hypertension: Secondary | ICD-10-CM | POA: Insufficient documentation

## 2018-04-12 DIAGNOSIS — R609 Edema, unspecified: Secondary | ICD-10-CM

## 2018-04-12 NOTE — Consult Note (Signed)
REASON FOR CONSULT:    Left leg pain.  The consult is requested by the physician's assistant in the emergency department.  ASSESSMENT & PLAN:   LEFT LEG PAIN: The etiology of her left leg pain is not clear to me.  It sounds like it could potentially represent venous hypertension in that she describes aching pain however she tells me that elevation makes the pain worse.  However not sure that she is elevating her legs correctly.  Because of her body habitus is difficult to assess her iliac veins.  There is no evidence of DVT so I suspect that her stent is open.  I have recommended that we get a CT venogram to further assess this but she does not want to have this done today.  HYPOECHOIC MASS LEFT GROIN: With respect to the incidental finding of a hypoechoic 10 cm mass in the left groin the etiology of this is not clear.  She did not have catheterization in the left groin.  I do not palpate a significant hematoma.  She is difficult to examine because of her size.  I suppose it is possible that during the intervention she had some type of perforation of a branch and developed a hematoma however this is not likely.  No flow was seen in the mass to suggest a pseudoaneurysm or AV fistula.  She is scheduled to see Dr. Donzetta Matters back in April and I will have the office get her into see him sooner.  I will discuss the case with him when he returns next week and we will see if he would like a CT venogram prior to that visit.  In the meantime I have discussed with her the importance of intermittent leg elevation and the proper positioning for this.  She will continue her Xarelto and Plavix.  Deitra Mayo, MD, FACS Beeper [phone] Office: [phone]   HPI:   [name] is a pleasant 73 y.o. female, who presents to the emergency department with left leg pain.  This patient apparently had a history of a DVT of the left lower extremity in October and presented with persistent left lower extremity  swelling.  On 01/19/2018, she underwent venography and stenting of the left common and external iliac veins by Dr. Donzetta Matters.  I did review the operative report from 01/19/18.  The patient was accessed through the left small saphenous vein.  Thus it does not look like she had catheterization in the left groin where the hematoma is.  1 month later, on 02/20/2018, the patient did have an ultrasound that I believe was done as a follow-up study.  Of note this was a limited scan secondary to her body habitus but there was some suggestion of stenosis in the left iliac veins.  There was no obvious thrombus seen in the external iliac vein and there was flow noted through the stent.  The patient was last seen by Dr. Donzetta Matters on 02/20/2018.  She had undergone the above-mentioned procedure and was on Xarelto.  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  She was wearing thigh-high compression stockings.  She was to be set up for a reflux study in 3 months.  On my history, the patient tells me that she was having pain in the left leg prior to the procedure and swelling.  She states that after the procedure the swelling improved.  She was also on diuretics.  However she began having "aching pain" in the  groin and thigh.  She states that this was different in character than the pain she had before the procedure.  This aching pain has been going on for a month.  It increased some over the last few days and for this reason she presented to the emergency department.  She is on her Xarelto.  She is also on Plavix.  She states that elevating her legs seems to make the pain worse.  She has been wearing knee-high compression stockings.  Walking sometimes helps with the pain.  Past Medical History:  Diagnosis Date  . Acute upper respiratory infection 07/06/2014  . Anemia   . Arthritis    Back   . Colon polyp    Tubular Adenoma   . Cough productive of clear sputum 06/22/2014  . GERD  (gastroesophageal reflux disease)   . History of right bundle branch block (RBBB)   . HOH (hard of hearing)   . Hypertension    had in the past, is no longer on medication for this and blood pressures are WNL  . 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 Primary localized osteoarthritis of right knee     Family History  Problem Relation Age of Onset  . Arthritis Mother   . Hypertension Mother   . Alzheimer's disease Father   . Diabetes Sister   . Hypertension Sister   . Hypertension Brother   . Stroke Brother   . Hypertension Brother   . Hypertension Sister   . Hypertension Sister   . Hypertension Sister   . Breast cancer Other        Niece  . Anesthesia problems Neg Hx   . Hypotension Neg Hx   . Malignant hyperthermia Neg Hx   . Pseudochol deficiency Neg Hx   . Colon cancer Neg Hx     SOCIAL HISTORY: Social History   Socioeconomic History  . Marital status: Divorced    Spouse name: Not on file  . Number of children: 1  . Years of education: Not on file  . Highest education level: Not on file  Occupational History  . Occupation: Retired   Scientific laboratory technician  . Financial resource strain: Not on file  . Food insecurity:    Worry: Not on file    Inability: Not on file  . Transportation needs:    Medical: Not on file    Non-medical: Not on file  Tobacco Use  . Smoking status: Former Smoker    Years: 1.00    Last attempt to quit: 04/22/1988    Years since quitting: 29.9  . Smokeless tobacco: Never Used  Substance and Sexual Activity  . Alcohol use: No  . Drug use: No  . Sexual activity: Yes    Birth control/protection: Surgical  Lifestyle  . Physical activity:    Days per week: Not on file    Minutes per session: Not on file  . Stress: Not on file  Relationships  . Social connections:    Talks on phone: Not on file    Gets together: Not on file    Attends religious service: Not on file    Active member of club or organization: Not on file    Attends meetings of clubs or  organizations: Not on file    Relationship status: Not on file  . Intimate partner violence:    Fear of current or ex partner: Not on file    Emotionally abused: Not on file    Physically abused: Not on file  Forced sexual activity: Not on file  Other Topics Concern  . Not on file  Social History Narrative   Daily caffeine     No Known Allergies  No current facility-administered medications for this encounter.    Current Outpatient Medications  Medication Sig Dispense Refill  . clopidogrel (PLAVIX) 75 MG tablet Take 1 tablet (75 mg total) by mouth daily with breakfast. 30 tablet 11  . cycloSPORINE (RESTASIS) 0.05 % ophthalmic emulsion Place 1 drop into both eyes 2 (two) times daily.    . furosemide (LASIX) 40 MG tablet Take 40 mg by mouth daily.    . Olopatadine HCl 0.2 % SOLN Place 1 drop into both eyes daily.    . potassium chloride (K-DUR) 10 MEQ tablet Take 10 mEq by mouth daily. With lasix  2  . rivaroxaban (XARELTO) 20 MG TABS tablet Take 20 mg by mouth daily with breakfast.    . Rivaroxaban 15 & 20 MG TBPK Take as directed on package: Start with one 15mg  tablet by mouth twice a day with food. On Day 22, switch to one 20mg  tablet once a day with food. (Patient not taking: Reported on 04/12/2018) 51 each 0    REVIEW OF SYSTEMS:  [X]  denotes positive finding, [ ]  denotes negative finding Cardiac  Comments:  Chest pain or chest pressure:    Shortness of breath upon exertion:    Short of breath when lying flat:    Irregular heart rhythm:        Vascular    Pain in calf, thigh, or hip brought on by ambulation:    Pain in feet at night that wakes you up from your sleep:     Blood clot in your veins:    Leg swelling:  x       Pulmonary    Oxygen at home:    Productive cough:     Wheezing:         Neurologic    Sudden weakness in arms or legs:     Sudden numbness in arms or legs:     Sudden onset of difficulty speaking or slurred speech:    Temporary loss of vision  in one eye:     Problems with dizziness:         Gastrointestinal    Blood in stool:     Vomited blood:         Genitourinary    Burning when urinating:     Blood in urine:        Psychiatric    Major depression:         Hematologic    Bleeding problems:    Problems with blood clotting too easily:        Skin    Rashes or ulcers:        Constitutional    Fever or chills:     PHYSICAL EXAM:   Vitals:   04/12/18 1130 04/12/18 1145 04/12/18 1315 04/12/18 1500  BP: 124/60 134/62 109/62   Pulse: 74 81 73 81  Resp:      Temp:      TempSrc:      SpO2: 100% 100% 98% 99%  Weight:      Height:        GENERAL: The patient is a well-nourished female, in no acute distress. The vital signs are documented above. CARDIAC: There is a regular rate and rhythm.  VASCULAR: I do not detect carotid bruits. She has bilateral lower extremity  swelling and hyperpigmentation bilaterally. I cannot palpate pedal pulses because of her swelling but she has brisk Doppler signals in the dorsalis pedis and posterior tibial positions bilaterally. I am unable to palpate a significant hematoma in the left groin although because of her body habitus it is difficult to assess. PULMONARY: There is good air exchange bilaterally without wheezing or rales. ABDOMEN: Soft and non-tender with normal pitched bowel sounds.  MUSCULOSKELETAL: There are no major deformities or cyanosis. NEUROLOGIC: No focal weakness or paresthesias are detected. SKIN: There are no ulcers or rashes noted. PSYCHIATRIC: The patient has a normal affect.  DATA:    VENOUS DUPLEX: I have independently interpreted her venous duplex scan that was done today.  This showed no evidence of DVT in the left lower extremity and no evidence of superficial venous thrombosis.  An incidental finding however was a large hypoechoic mass measuring 10 cm in the left groin possibly consistent with a hematoma.

## 2018-04-12 NOTE — Progress Notes (Signed)
LLE venous duplex       has been completed. Preliminary results can be found under CV proc through chart review. Naithen Rivenburg, BS, RDMS, RVT    

## 2018-04-12 NOTE — ED Notes (Addendum)
Pt transported to xray 

## 2018-04-12 NOTE — Discharge Instructions (Addendum)
Please schedule an appointment with Dr. Donzetta Matters.

## 2018-04-12 NOTE — ED Notes (Signed)
Pt ambulated self efficiently without difficulty to restroom.

## 2018-04-12 NOTE — ED Provider Notes (Signed)
[facility] EMERGENCY DEPARTMENT Provider Note   CSN: [id number] Arrival date & time: 04/12/18  1042    History   Chief Complaint Chief Complaint  Patient presents with  . Leg Pain    left    HPI [name] is a 73 y.o. female with a past medical history of DVT of left leg, back arthritis, GERD, hypertension, who presents today for evaluation of left leg pain.  She had a surgical procedure in December where she reportedly had blood clots removed and stent placement in the veins on her left leg.  She reports that since then she has had a aching pain in that leg and it has been slightly swollen however she states that the pain and swelling has increased over the past few days.  She denies any specific injury or numbness.  She denies any abdominal pain, chest pain, shortness of breath.  She reports compliance her Plavix and Xarelto.       HPI  Past Medical History:  Diagnosis Date  . Acute upper respiratory infection 07/06/2014  . Anemia   . Arthritis    Back   . Colon polyp    Tubular Adenoma   . Cough productive of clear sputum 06/22/2014  . GERD (gastroesophageal reflux disease)   . History of right bundle branch block (RBBB)   . HOH (hard of hearing)   . Hypertension    had in the past, is no longer on medication for this and blood pressures are WNL  . 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 Primary localized osteoarthritis of right knee     Patient Active Problem List   Diagnosis Date Noted  . DVT (deep venous thrombosis) ([place]) 01/18/2018  . Acute upper respiratory infection 07/06/2014  . 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 DJD (degenerative joint disease) of knee 07/04/2014  . Primary localized osteoarthritis of right knee   . Arthritis   . Anemia   . GERD (gastroesophageal reflux disease)   . Colon polyp   . HOH (hard of hearing)   . Postoperative anemia due to acute blood loss 05/01/2011  . 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 ABDOMINAL HYSTERECTOMY  2012  . CHOLECYSTECTOMY N/A 03/09/2013   Procedure: LAPAROSCOPIC CHOLECYSTECTOMY;  Surgeon: Gayland Curry, MD;  Location: [facility];  Service: General;  Laterality: N/A;  . COLONOSCOPY W/ BIOPSIES    . LARYNGOSCOPY Left 03/14/2017   Procedure: LARYNGOSCOPY;  Surgeon: Helayne Seminole, MD;  Location: [facility];  Service: ENT;  Laterality: Left;  . LOWER EXTREMITY VENOGRAPHY Left 01/19/2018   Procedure: LOWER EXTREMITY VENOGRAPHY;  Surgeon: Waynetta Sandy, MD;  Location: [facility] CV [facility];  Service: Cardiovascular;  Laterality: Left;  . PERIPHERAL VASCULAR INTERVENTION Left 01/19/2018   Procedure: PERIPHERAL VASCULAR INTERVENTION;  Surgeon: Waynetta Sandy, MD;  Location: [facility];  Service: Cardiovascular;  Laterality: Left;  LEFT ILIAC VENOUS  . TOTAL KNEE ARTHROPLASTY  04/29/2011   Procedure: TOTAL KNEE ARTHROPLASTY;  Surgeon: Lorn Junes, MD;  Location: [facility];  Service: Orthopedics;  Laterality: Left;  [facility] [place] THIS CASE  . TOTAL KNEE ARTHROPLASTY Right 07/04/2014   Procedure: TOTAL KNEE ARTHROPLASTY;  Surgeon: Elsie Saas, MD;  Location: [facility];  Service: Orthopedics;  Laterality: Right;     OB History   No obstetric history on file.      Home Medications  Prior to Admission medications   Medication Sig Start Date End Date Taking? Authorizing Provider  clopidogrel (PLAVIX) 75 MG tablet Take 1 tablet (75 mg total) by mouth daily with breakfast. 01/21/18  Yes Laurence Slate M, PA-C  cycloSPORINE (RESTASIS) 0.05 % ophthalmic emulsion Place 1 drop into both eyes 2 (two) times daily.   Yes [provider]  furosemide (LASIX) 40 MG tablet Take 40 mg by mouth daily. 12/24/17  Yes [provider]  Olopatadine HCl 0.2 % SOLN Place 1 drop into both eyes daily. 12/06/17  Yes [provider]  potassium chloride (K-DUR) 10 MEQ tablet Take  10 mEq by mouth daily. With lasix 12/24/17  Yes [provider]  rivaroxaban (XARELTO) 20 MG TABS tablet Take 20 mg by mouth daily with breakfast.   Yes [provider]  Rivaroxaban 15 & 20 MG TBPK Take as directed on package: Start with one 15mg  tablet by mouth twice a day with food. On Day 22, switch to one 20mg  tablet once a day with food. Patient not taking: Reported on 04/12/2018 01/20/18   Ulyses Amor, PA-C    Family History Family History  Problem Relation Age of Onset  . Arthritis Mother   . Hypertension Mother   . Alzheimer's disease Father   . Diabetes Sister   . Hypertension Sister   . Hypertension Brother   . Stroke Brother   . Hypertension Brother   . Hypertension Sister   . Hypertension Sister   . Hypertension Sister   . Breast cancer Other        Niece  . Anesthesia problems Neg Hx   . Hypotension Neg Hx   . Malignant hyperthermia Neg Hx   . Pseudochol deficiency Neg Hx   . Colon cancer Neg Hx     Social History Social History   Tobacco Use  . Smoking status: Former Smoker    Years: 1.00    Last attempt to quit: 04/22/1988    Years since quitting: 29.9  . Smokeless tobacco: Never Used  Substance Use Topics  . Alcohol use: No  . Drug use: No     Allergies   Patient has no known allergies.   Review of Systems Review of Systems  Constitutional: Negative for chills and fever.  Respiratory: Negative for chest tightness and shortness of breath.   Cardiovascular: Positive for leg swelling. Negative for chest pain.  All other systems reviewed and are negative.    Physical Exam Updated Vital Signs BP 117/73 (BP Location: Right Arm)   Pulse 78   Temp 97.6 F (36.4 C) (Oral)   Resp 18   Ht 5\' 1"  (1.549 m)   Wt 108.9 kg   SpO2 98%   BMI 45.35 kg/m   Physical Exam Vitals signs and nursing note reviewed.  Constitutional:      General: She is not in acute distress.    Appearance: She is well-developed.  HENT:     Head:  Normocephalic and atraumatic.  Eyes:     Conjunctiva/sclera: Conjunctivae normal.  Neck:     Musculoskeletal: Normal range of motion and neck supple. No neck rigidity.  Cardiovascular:     Rate and Rhythm: Normal rate and regular rhythm.     Heart sounds: Normal heart sounds. No murmur.     Comments: 2+ DP pulses bilaterally.  PT pulses are not palpable bilaterally.  2+ left femoral pulse.  Brisk capillary refill to toes on bilateral feet. Pulmonary:  Effort: Pulmonary effort is normal. No respiratory distress.     Breath sounds: Normal breath sounds.  Abdominal:     General: Abdomen is flat. There is no distension.     Palpations: Abdomen is soft.     Tenderness: There is no abdominal tenderness.     Hernia: No hernia is present.  Musculoskeletal:     Comments: Left leg from knee down is edematous with 2+ pitting edema.  Right leg has 1+ pitting edema.  Bilateral lower extremities have no obvious crepitus or deformities.  Skin:    General: Skin is warm and dry.     Comments: Left lower leg is erythematous when compared to right lower leg.  Neurological:     Mental Status: She is alert.     Comments: Sensation intact to bilateral lower extremities.  Psychiatric:        Mood and Affect: Mood normal.        Behavior: Behavior normal.      ED Treatments / Results  Labs (all labs ordered are listed, but only abnormal results are displayed) Labs Reviewed - No data to display  EKG None  Radiology Dg Hip Unilat With Pelvis 2-3 Views Left  Result Date: 04/12/2018 CLINICAL DATA:  Left hip pain for 1 month EXAM: DG HIP (WITH OR WITHOUT PELVIS) 2-3V LEFT COMPARISON:  11/06/2016 FINDINGS: No acute fracture or dislocation. Moderate degenerative change of the hip joints bilaterally. Left iliac venous stent is in place. Unremarkable soft tissues. IMPRESSION: No acute bony pathology. Electronically Signed   By: Marybelle Killings M.D.   On: 04/12/2018 13:18   Vas [place] Lower Extremity Venous  (dvt) ([facility] And [facility] 7a-7p)  Result Date: 04/12/2018  Lower Venous Study Indications: Edema.  Risk Factors: DVT Hx LLE DVT and iliac vein stent. Limitations: Body habitus. Performing Technologist: June Leap RDMS, RVT  Examination Guidelines: A complete evaluation includes B-mode imaging, spectral Doppler, color Doppler, and power Doppler as needed of all accessible portions of each vessel. Bilateral testing is considered an integral part of a complete examination. Limited examinations for reoccurring indications may be performed as noted.  Right Venous Findings: +---+---------------+---------+-----------+----------+-------+    CompressibilityPhasicitySpontaneityPropertiesSummary +---+---------------+---------+-----------+----------+-------+ CFVFull           Yes      Yes                          +---+---------------+---------+-----------+----------+-------+  Left Venous Findings: +---------+---------------+---------+-----------+----------+-------------------+          CompressibilityPhasicitySpontaneityPropertiesSummary             +---------+---------------+---------+-----------+----------+-------------------+ CFV      Full           Yes      Yes                                      +---------+---------------+---------+-----------+----------+-------------------+ SFJ      Full                                                             +---------+---------------+---------+-----------+----------+-------------------+ FV Prox  Full                                                             +---------+---------------+---------+-----------+----------+-------------------+  FV Mid   Full                                         not well visualized +---------+---------------+---------+-----------+----------+-------------------+ FV Distal                                             Not visualized       +---------+---------------+---------+-----------+----------+-------------------+ POP      Full           Yes      Yes                                      +---------+---------------+---------+-----------+----------+-------------------+ PTV      Full                                         not well visualized +---------+---------------+---------+-----------+----------+-------------------+ PERO                                                  Not visualized      +---------+---------------+---------+-----------+----------+-------------------+    Summary: Right: No evidence of common femoral vein obstruction. Left: There is no evidence of deep vein thrombosis in the lower extremity. However, portions of this examination were limited- see technologist comments above. Left groin: Large hypoechoic area with mixed echoes noted measuring nearly 10 cm. Possible hematoma versus unknown etiology. Ultrasound characteristics of enlarged lymph nodes noted in the groin.  *See table(s) above for measurements and observations. Electronically signed by Deitra Mayo MD on 04/12/2018 at 4:09:22 PM.    Final     Procedures Procedures (including critical care time)  Medications Ordered in ED Medications - No data to display   Initial Impression / Assessment and Plan / ED Course  I have reviewed the triage vital signs and the nursing notes.  Pertinent labs & imaging results that were available during my care of the patient were reviewed by me and considered in my medical decision making (see chart for details).  Clinical Course as of Apr 12 1633  Sun Apr 12, 2018  1454 Ordered consult for vascular.   VAS [place] LOWER EXTREMITY VENOUS (DVT) ([facility] 7a-7p) [EH]  1610 Spoke with Dr. Scot Dock from vascular surgery.  He will come see patient.    [EH]  68 Spoke with Dr. Scot Dock from vascular surgery, he evaluated the patient.  He recommended a CT venogram however patient refused this.  He states  that patient can be discharged with outpatient follow-up.   [EH]    Clinical Course User Index [EH] Lorin Glass, PA-C      Patient presents today for evaluation of left leg and groin pain.  She is previously had a DVT in the left leg with a stent placed.  Left hip x-rays were obtained, as that is the primary location of her pain, without evidence of acute abnormality.  DVT study was obtained of the left leg which did not  show DVT however showed a 10 cm area, possibly a hematoma however unsure, and the left groin.  Dr. Scot Dock from vascular surgery came to evaluate the patient.  He reportedly recommended CT scan which she refused.  Patient is discharged with instructions to follow-up with her primary vascular doctor.  Patient was seen as a shared visit with Dr. Roderic Palau.  Return precautions were discussed with patient who states their understanding.  At the time of discharge patient denied any unaddressed complaints or concerns.  Patient is agreeable for discharge home.   Final Clinical Impressions(s) / ED Diagnoses   Final diagnoses:  Left leg pain    ED Discharge Orders    None       Ollen Gross 04/12/18 1635    Milton Ferguson, MD 04/13/18 2259

## 2018-04-12 NOTE — ED Notes (Signed)
Patient verbalizes understanding of discharge instructions. Opportunity for questioning and answers were provided. Armband removed by staff, pt discharged from ED.  

## 2018-04-12 NOTE — ED Notes (Signed)
Pt returned from xray

## 2018-04-12 NOTE — ED Triage Notes (Addendum)
Pt arrives to [facility] ER via POV with increased left leg pain since surgical procedure in December (states having 4 blood clots removed with stent placement in left leg). Pt reports pain in this leg ever since, however has worsened over the past few days.  Describes as throbbing and aching, 10/10 pain.  Denies numbness, and is able to stand and ambulate on affected extremity however noticeably limping. Swelling to leg noted, no redness or temp noted, pedal pulses weak compared to right leg.

## 2018-05-14 ENCOUNTER — Other Ambulatory Visit: Payer: Self-pay

## 2018-05-14 ENCOUNTER — Telehealth (HOSPITAL_COMMUNITY): Payer: Self-pay | Admitting: Rehabilitation

## 2018-05-14 DIAGNOSIS — I82422 Acute embolism and thrombosis of left iliac vein: Secondary | ICD-10-CM

## 2018-05-15 ENCOUNTER — Other Ambulatory Visit: Payer: Self-pay

## 2018-05-15 ENCOUNTER — Ambulatory Visit (HOSPITAL_COMMUNITY)
Admission: RE | Admit: 2018-05-15 | Discharge: 2018-05-15 | Disposition: A | Discharge status: Home / Self Care | Payer: Medicare Other | Source: Ambulatory Visit | Attending: Family | Admitting: Family

## 2018-05-15 ENCOUNTER — Ambulatory Visit (INDEPENDENT_AMBULATORY_CARE_PROVIDER_SITE_OTHER): Payer: Medicare Other | Admitting: Vascular Surgery

## 2018-05-15 ENCOUNTER — Encounter: Payer: Self-pay | Admitting: Vascular Surgery

## 2018-05-15 VITALS — BP 124/68 | HR 72 | Temp 97.3°F | Resp 18 | Ht 61.0 in | Wt 240.0 lb

## 2018-05-15 DIAGNOSIS — I87002 Postthrombotic syndrome without complications of left lower extremity: Secondary | ICD-10-CM

## 2018-05-15 DIAGNOSIS — I82422 Acute embolism and thrombosis of left iliac vein: Secondary | ICD-10-CM | POA: Insufficient documentation

## 2018-05-15 NOTE — Progress Notes (Signed)
Patient ID: [name], female   DOB: 01-Jun-1945, 73 y.o.   MRN: [id number]  Reason for Consult: Follow-up   Referred by Nolene Ebbs, MD  Subjective:     HPI:  [name] is a 73 y.o. 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  The time she did not have DVT of her left lower extremity but did have stagnant flow.  She has persistent left lower extremity pain that she states extends from her buttocks down to her leg.  She does have swelling although it has improved and she is wearing thigh-high compression stockings religiously.  She also has a groin mass on the left that is causing her significant discomfort.  She was evaluated in the emergency department offered CT venogram but did not want at that time.  She presents today with reflux studies.  She does not have any tissue loss or ulceration.  She continues to walk with the help of a cane.  She stopped taking her blood thinner several weeks ago at least but has been taking Plavix.  Past Medical History:  Diagnosis Date  . Acute upper respiratory infection 07/06/2014  . Anemia   . Arthritis    Back   . Colon polyp    Tubular Adenoma   . Cough productive of clear sputum 06/22/2014  . GERD (gastroesophageal reflux disease)   . History of right bundle branch block (RBBB)   . HOH (hard of hearing)   . Hypertension    had in the past, is no longer on medication for this and blood pressures are WNL  . 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 Primary localized osteoarthritis of right knee    Family History  Problem Relation Age of Onset  . Arthritis Mother   . Hypertension Mother   . Alzheimer's disease Father   . Diabetes Sister   . Hypertension Sister   . Hypertension Brother   . Stroke Brother   . Hypertension Brother   . Hypertension Sister   . Hypertension Sister   . Hypertension Sister   . Breast cancer Other        Niece  . Anesthesia  problems Neg Hx   . Hypotension Neg Hx   . Malignant hyperthermia Neg Hx   . Pseudochol deficiency Neg Hx   . Colon cancer Neg Hx    Past Surgical History:  Procedure Laterality Date  . ABDOMINAL HYSTERECTOMY  2012  . CHOLECYSTECTOMY N/A 03/09/2013   Procedure: LAPAROSCOPIC CHOLECYSTECTOMY;  Surgeon: Gayland Curry, MD;  Location: [facility];  Service: General;  Laterality: N/A;  . COLONOSCOPY W/ BIOPSIES    . LARYNGOSCOPY Left 03/14/2017   Procedure: LARYNGOSCOPY;  Surgeon: Helayne Seminole, MD;  Location: [facility];  Service: ENT;  Laterality: Left;  . LOWER EXTREMITY VENOGRAPHY Left 01/19/2018   Procedure: LOWER EXTREMITY VENOGRAPHY;  Surgeon: Waynetta Sandy, MD;  Location: [facility];  Service: Cardiovascular;  Laterality: Left;  . PERIPHERAL VASCULAR INTERVENTION Left 01/19/2018   Procedure: PERIPHERAL VASCULAR INTERVENTION;  Surgeon: Waynetta Sandy, MD;  Location: [facility];  Service: Cardiovascular;  Laterality: Left;  LEFT ILIAC VENOUS  . TOTAL KNEE ARTHROPLASTY  04/29/2011   Procedure: TOTAL KNEE ARTHROPLASTY;  Surgeon: Lorn Junes, MD;  Location: [facility];  Service: Orthopedics;  Laterality: Left;  DR [place] THIS CASE  . TOTAL KNEE ARTHROPLASTY Right  07/04/2014   Procedure: TOTAL KNEE ARTHROPLASTY;  Surgeon: Elsie Saas, MD;  Location: [facility];  Service: Orthopedics;  Laterality: Right;    Short Social History:  Social History   Tobacco Use  . Smoking status: Former Smoker    Years: 1.00    Last attempt to quit: 04/22/1988    Years since quitting: 30.0  . Smokeless tobacco: Never Used  Substance Use Topics  . Alcohol use: No    No Known Allergies  Current Outpatient Medications  Medication Sig Dispense Refill  . cycloSPORINE (RESTASIS) 0.05 % ophthalmic emulsion Place 1 drop into both eyes 2 (two) times daily.    . furosemide (LASIX) 40 MG tablet Take 40 mg by mouth daily.    . Olopatadine HCl 0.2 % SOLN Place 1 drop  into both eyes daily.    . potassium chloride (K-DUR) 10 MEQ tablet Take 10 mEq by mouth daily. With lasix  2  . clopidogrel (PLAVIX) 75 MG tablet Take 1 tablet (75 mg total) by mouth daily with breakfast. (Patient not taking: Reported on 05/15/2018) 30 tablet 11  . rivaroxaban (XARELTO) 20 MG TABS tablet Take 20 mg by mouth daily with breakfast.    . Rivaroxaban 15 & 20 MG TBPK Take as directed on package: Start with one 15mg  tablet by mouth twice a day with food. On Day 22, switch to one 20mg  tablet once a day with food. (Patient not taking: Reported on 04/12/2018) 51 each 0   No current facility-administered medications for this visit.     Review of Systems  Constitutional:  Constitutional negative. HENT: HENT negative.  Eyes: Eyes negative.  Respiratory: Respiratory negative.  Cardiovascular: Positive for leg swelling.  GI: Gastrointestinal negative.  Musculoskeletal: Positive for gait problem, leg pain and joint pain.  Hematologic: Hematologic/lymphatic negative.  Psychiatric: Psychiatric negative.        Objective:  Objective   Vitals:   05/15/18 1013  BP: 124/68  Pulse: 72  Resp: 18  Temp: (!) 97.3 F (36.3 C)  SpO2: 100%  Weight: 240 lb (108.9 kg)  Height: 5\' 1"  (1.549 m)   Body mass index is 45.35 kg/m.  Physical Exam HENT:     Head: Normocephalic.  Eyes:     Pupils: Pupils are equal, round, and reactive to light.  Neck:     Musculoskeletal: Normal range of motion.  Cardiovascular:     Rate and Rhythm: Normal rate.  Pulmonary:     Effort: Pulmonary effort is normal.  Abdominal:     Palpations: Abdomen is soft. There is no mass.  Musculoskeletal:        General: Swelling present.     Comments: Left leg with nonpitting edema from thigh down. Right leg measures 44.5 cm left 49 cm 10 cm beyond the tibial tuberosity  There is suggestion of a left groin bulge does not appear to have pulsatility but exam limited by patient habitus.  Skin:    General: Skin is  warm and dry.  Neurological:     General: No focal deficit present.     Mental Status: She is alert.  Psychiatric:        Mood and Affect: Mood normal.        Behavior: Behavior normal.        Thought Content: Thought content normal.        Judgment: Judgment normal.     Data: I have independently interpreted her lower extremity reflux study which demonstrates saphenous vein at the  junction measuring 0.58 cm.  There is no evidence of great or small saphenous vein reflux.  There is a large vascularized mass 6.8 cm medial groin.  There is continuous flow with no respiratory variation in the common femoral vein suggestive of proximal obstruction.     Assessment/Plan:    73 year old female with a complex issue having had obstructive process of the left common external leg veins now status post stenting.  Given her body habitus we have been unable to evaluate this with ultrasound although she does have persistent swelling and pain in her left lower extremity.  She has been off of her Xarelto because her pharmacy did not have a prescription to renew it at this time she is continuing Plavix only I see no need to start Xarelto having been off of it for several weeks.  She will need a CT venogram to better evaluate both the stent as well as this mass in her left groin which I can somewhat feel although does not appear to have pulsatility and she did not have any cannulation in the left groin so I am unsure of the etiology.  We will get her CT scan and have her follow-up afterwards in the next few weeks although she understands this may be delayed given the novel coronavirus-      Waynetta Sandy MD [facility]

## 2018-05-18 ENCOUNTER — Other Ambulatory Visit: Payer: Self-pay

## 2018-05-18 DIAGNOSIS — I87002 Postthrombotic syndrome without complications of left lower extremity: Secondary | ICD-10-CM

## 2018-05-25 ENCOUNTER — Other Ambulatory Visit: Payer: Self-pay

## 2018-05-29 ENCOUNTER — Encounter (HOSPITAL_COMMUNITY): Payer: Medicare Other

## 2018-05-29 ENCOUNTER — Ambulatory Visit: Payer: Medicare Other | Admitting: Vascular Surgery

## 2018-06-01 ENCOUNTER — Other Ambulatory Visit: Payer: Self-pay

## 2018-06-01 ENCOUNTER — Ambulatory Visit
Admission: RE | Admit: 2018-06-01 | Discharge: 2018-06-01 | Disposition: A | Discharge status: Home / Self Care | Payer: Medicare Other | Source: Ambulatory Visit | Attending: Vascular Surgery | Admitting: Vascular Surgery

## 2018-06-01 DIAGNOSIS — I87002 Postthrombotic syndrome without complications of left lower extremity: Secondary | ICD-10-CM

## 2018-06-01 MED ORDER — IOPAMIDOL (ISOVUE-370) INJECTION 76%
75.0000 mL | Freq: Once | INTRAVENOUS | Status: AC | PRN
  Administered 2018-06-01: 75 mL via INTRAVENOUS

## 2018-06-04 ENCOUNTER — Telehealth (HOSPITAL_COMMUNITY): Payer: Self-pay | Admitting: Rehabilitation

## 2018-06-04 NOTE — Telephone Encounter (Signed)
The above patient or their representative was contacted and gave the following answers to these questions:         Do you have any of the following symptoms? No  Fever                    Cough                   Shortness of breath  Do  you have any of the following other symptoms? No   muscle pain         vomiting,        diarrhea        rash         weakness        red eye        abdominal pain         bruising          bruising or bleeding              joint pain           severe headache    Have you been in contact with someone who was or has been sick in the past 2 weeks? No  Yes                 Unsure                         Unable to assess   Does the person that you were in contact with have any of the following symptoms?   Cough         shortness of breath           muscle pain         vomiting,            diarrhea            rash            weakness           fever            red eye           abdominal pain           bruising  or  bleeding                joint pain                severe headache               Have you  or someone you have been in contact with traveled internationally in th last month? No        If yes, which countries?   Have you  or someone you have been in contact with traveled outside [place] in th last month? No         If yes, which state and city?   COMMENTS OR ACTION PLAN FOR THIS PATIENT:          

## 2018-06-05 ENCOUNTER — Encounter: Payer: Self-pay | Admitting: Vascular Surgery

## 2018-06-05 ENCOUNTER — Ambulatory Visit (INDEPENDENT_AMBULATORY_CARE_PROVIDER_SITE_OTHER): Payer: Medicare Other | Admitting: Vascular Surgery

## 2018-06-05 ENCOUNTER — Other Ambulatory Visit: Payer: Self-pay

## 2018-06-05 VITALS — BP 112/66 | HR 85 | Temp 98.1°F | Resp 20 | Ht 61.0 in | Wt 237.0 lb

## 2018-06-05 DIAGNOSIS — I87002 Postthrombotic syndrome without complications of left lower extremity: Secondary | ICD-10-CM

## 2018-06-05 NOTE — Progress Notes (Signed)
Patient ID: [name], female   DOB: [birth year], 73 y.o.   MRN: [id number]  Reason for Consult: Follow-up   Referred by Nolene Ebbs, MD  Subjective:     HPI:  [name] is a 73 y.o. female previously underwent left common iliac vein stenting for what was considered may Thurner's although at the time she did not have any lower extremity DVT only stagnant flow.  Since that time she has had no improvement in her swelling.  She has had pain at night and has a groin mass that was noted on recent ultrasound.  For that reason we will obtain CT scan.  She is losing weight as well given lack of appetite at this time.  Past Medical History:  Diagnosis Date  . Acute upper respiratory infection 07/06/2014  . Anemia   . Arthritis    Back   . Colon polyp    Tubular Adenoma   . Cough productive of clear sputum 06/22/2014  . GERD (gastroesophageal reflux disease)   . History of right bundle branch block (RBBB)   . HOH (hard of hearing)   . Hypertension    had in the past, is no longer on medication for this and blood pressures are WNL  . 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 Primary localized osteoarthritis of right knee    Family History  Problem Relation Age of Onset  . Arthritis Mother   . Hypertension Mother   . Alzheimer's disease Father   . Diabetes Sister   . Hypertension Sister   . Hypertension Brother   . Stroke Brother   . Hypertension Brother   . Hypertension Sister   . Hypertension Sister   . Hypertension Sister   . Breast cancer Other        Niece  . Anesthesia problems Neg Hx   . Hypotension Neg Hx   . Malignant hyperthermia Neg Hx   . Pseudochol deficiency Neg Hx   . Colon cancer Neg Hx    Past Surgical History:  Procedure Laterality Date  . ABDOMINAL HYSTERECTOMY  2012  . CHOLECYSTECTOMY N/A 03/09/2013   Procedure: LAPAROSCOPIC CHOLECYSTECTOMY;  Surgeon: Gayland Curry, MD;  Location: [facility];  Service: General;  Laterality: N/A;  . COLONOSCOPY W/ BIOPSIES    .  LARYNGOSCOPY Left 03/14/2017   Procedure: LARYNGOSCOPY;  Surgeon: Helayne Seminole, MD;  Location: [facility];  Service: ENT;  Laterality: Left;  . LOWER EXTREMITY VENOGRAPHY Left 01/19/2018   Procedure: LOWER EXTREMITY VENOGRAPHY;  Surgeon: Waynetta Sandy, MD;  Location: [facility];  Service: Cardiovascular;  Laterality: Left;  . PERIPHERAL VASCULAR INTERVENTION Left 01/19/2018   Procedure: PERIPHERAL VASCULAR INTERVENTION;  Surgeon: Waynetta Sandy, MD;  Location: [facility];  Service: Cardiovascular;  Laterality: Left;  LEFT ILIAC VENOUS  . TOTAL KNEE ARTHROPLASTY  04/29/2011   Procedure: TOTAL KNEE ARTHROPLASTY;  Surgeon: Lorn Junes, MD;  Location: [facility];  Service: Orthopedics;  Laterality: Left;  DR [place] THIS CASE  . TOTAL KNEE ARTHROPLASTY Right 07/04/2014   Procedure: TOTAL KNEE ARTHROPLASTY;  Surgeon: Elsie Saas, MD;  Location: [facility];  Service: Orthopedics;  Laterality: Right;    Short Social History:  Social History   Tobacco Use  . Smoking status: Former Smoker    Years: 1.00    Last attempt to quit: 04/22/1988    Years since quitting: 30.1  . Smokeless tobacco: Never Used  Substance Use Topics  .  Alcohol use: No    No Known Allergies  Current Outpatient Medications  Medication Sig Dispense Refill  . clopidogrel (PLAVIX) 75 MG tablet Take 1 tablet (75 mg total) by mouth daily with breakfast. 30 tablet 11  . cycloSPORINE (RESTASIS) 0.05 % ophthalmic emulsion Place 1 drop into both eyes 2 (two) times daily.    . furosemide (LASIX) 40 MG tablet Take 40 mg by mouth daily.    . Olopatadine HCl 0.2 % SOLN Place 1 drop into both eyes daily.    . potassium chloride (K-DUR) 10 MEQ tablet Take 10 mEq by mouth daily. With lasix  2  . rivaroxaban (XARELTO) 20 MG TABS tablet Take 20 mg by mouth daily with breakfast.     No current facility-administered medications for this visit.     Review of Systems  Constitutional:  Positive for unexpected weight change.  HENT: HENT negative.  Eyes: Eyes negative.  Respiratory: Respiratory negative.  Cardiovascular: Positive for leg swelling.  GI: Gastrointestinal negative.  Musculoskeletal: Positive for leg pain and joint pain.  Neurological: Neurological negative. Hematologic: Hematologic/lymphatic negative.  Psychiatric: Psychiatric negative.        Objective:  Objective   Vitals:   06/05/18 1114  BP: 112/66  Pulse: 85  Resp: 20  Temp: 98.1 F (36.7 C)  Weight: 237 lb (107.5 kg)  Height: 5\' 1"  (1.549 m)   Body mass index is 44.78 kg/m.  Physical Exam Constitutional:      Appearance: She is obese.  Eyes:     Pupils: Pupils are equal, round, and reactive to light.  Abdominal:     General: Abdomen is flat.     Palpations: Abdomen is soft. There is no mass.  Musculoskeletal:        General: Swelling present.     Left lower leg: Edema present.     Comments: There is suggestion of fullness in the left groin however I cannot palpate an actual mass.  Skin:    General: Skin is warm and dry.  Neurological:     General: No focal deficit present.     Mental Status: She is alert.  Psychiatric:        Mood and Affect: Mood normal.        Behavior: Behavior normal.        Thought Content: Thought content normal.        Judgment: Judgment normal.     Data: IMPRESSION: 1. 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 Additionally, there is lucency involving the medial sidewall of the left acetabulum with potential nondisplaced pathologic fracture. Further evaluation with contrast-enhanced pelvic MRI could be performed as clinically indicated. 2. The left pelvic arterial and venous system is encased by this infiltrative left pelvic sidewall mass however while difficult to ascertain, the left external iliac venous stent appears patent.  I  reviewed the CT scan with the patient and her son demonstrated a large mass as well as the associated lymph node in the left groin.     Assessment/Plan:     73 year old female status post left common iliac vein stenting had persistent swelling underwent CT scan which demonstrates a large pelvic wall mass with likely associated lymph node which we have previously seen on ultrasound that led to to the CT scan being performed.  This is certainly concerning for malignancy and I discussed this with her patient and her son and we have reviewed the  CT scan together.  I have spoken with her PCP Dr. Jeanie Cooks and he will make a referral for biopsy.  Her vein does appear to be patent.  She is taking Plavix I have asked her to take aspirin as well and she will take nonsteroidals for pain.  She will follow-up in 6 months with me for duplex of her iliac vein stent.     Waynetta Sandy MD [facility]

## 2018-06-12 ENCOUNTER — Other Ambulatory Visit: Payer: Self-pay | Admitting: Surgery

## 2018-06-12 ENCOUNTER — Encounter (HOSPITAL_COMMUNITY): Payer: Medicare Other

## 2018-06-12 ENCOUNTER — Ambulatory Visit: Payer: Medicare Other | Admitting: Vascular Surgery

## 2018-06-16 NOTE — Pre-Procedure Instructions (Signed)
[id number] [name], [place] [address] Phone: [phone] Fax: [phone]  [facility] [place] [place] [address] [place] [address] Phone: [phone] Fax: [phone]      Your procedure is scheduled on Friday, April 30th.  Report to [facility] Main Entrance "A" Then check in at Admitting at 5:30 A.M.  Call this number if you have problems the morning of surgery:  [phone]  Call [phone] if you have any questions prior to your surgery date Monday-Friday 8am-4pm    Remember:  Do not eat or drink after midnight.     Take these medicines the morning of surgery with A SIP OF WATER:  acetaminophen-codeine (TYLENOL #3) if needed.     You may use your eye drops.    Follow your surgeon's instructions on when to stop/resume Xarelto and Plavix.  If no instructions were given by your surgeon then you will need to call the office to get those instructions.    As of today, STOP taking any Aspirin (unless otherwise instructed by your surgeon), Aleve, Naproxen, Ibuprofen, Motrin, Advil, Goody's, BC's, all herbal medications, fish oil, and all vitamins.    The Morning of Surgery  Do not wear jewelry, make-up or nail polish.  Do not wear lotions, powders, or perfumes/colognes, or deodorant  Do not shave 48 hours prior to surgery.  Men may shave face and neck.  Do not bring valuables to the hospital.  [facility] is not responsible for any belongings or valuables.  If you are a smoker, DO NOT Smoke 24 hours prior to surgery  REMEMBER THAT YOU MUST SOMEONE TO TRANSPORT YOU HOME AFTER SURGERY IF YOU ARE DISCHARGED THE SAME DAY OF SURGERY  YOU WILL ALSO NEED SOMEONE TO STAY WITH YOU FOR 24 HOURS AFTER SURGERY IF YOU ARE DISCHARGED THE SAME DAY OF SURGERY   Contacts, glasses, hearing aids, dentures or bridgework may not be worn into surgery.    If you wear a CPAP at night please bring your mask, tubing, and machine the morning of surgery   Leave your suitcase in the car.  After surgery it may be brought to your room.  For patients admitted to the hospital, discharge time will be determined by your treatment team.  Patients discharged the day of surgery will not be allowed to drive home.    Special instructions:   [place] [facility]- Preparing For Surgery  Before surgery, you can play an important role. Because skin is not sterile, your skin needs to be as free of germs as possible. You can reduce the number of germs on your skin by washing with CHG (chlorahexidine gluconate) Soap before surgery.  CHG is an antiseptic cleaner which kills germs and bonds with the skin to continue killing germs even after washing.    Oral Hygiene is also important to reduce your risk of infection.  Remember - BRUSH YOUR TEETH THE MORNING OF SURGERY WITH YOUR REGULAR TOOTHPASTE  Please do not use if you have an allergy to CHG or antibacterial soaps. If your skin becomes reddened/irritated stop using the CHG.  Do not shave (including legs and underarms) for at least 48 hours prior to first CHG shower. It is OK to shave your face.  Please follow these instructions carefully.   1. Shower the NIGHT BEFORE SURGERY and the MORNING OF SURGERY with CHG Soap.   2. If  you chose to wash your hair, wash your hair first as usual with your normal shampoo.  3. After you shampoo, rinse your hair and body thoroughly to remove the shampoo.  4. Use CHG as you would any other liquid soap. You can apply CHG directly to the skin and wash gently with a scrungie or a clean washcloth.   5. Apply the CHG Soap to your body ONLY FROM THE NECK DOWN.  Do not use on open wounds or open sores. Avoid contact with your eyes, ears, mouth and genitals (private parts). Wash Face and genitals (private parts)  with your normal soap.   6. Wash thoroughly, paying special attention to the  area where your surgery will be performed.  7. Thoroughly rinse your body with warm water from the neck down.  8. DO NOT shower/wash with your normal soap after using and rinsing off the CHG Soap.  9. Pat yourself dry with a CLEAN TOWEL.  10. Wear CLEAN PAJAMAS to bed the night before surgery, wear comfortable clothes the morning of surgery  11. Place CLEAN SHEETS on your bed the night of your first shower and DO NOT SLEEP WITH PETS.    Day of Surgery:  Do not apply any deodorants/lotions.  Please wear clean clothes to the hospital/surgery center.   Remember to brush your teeth WITH YOUR REGULAR TOOTHPASTE.   Please read over the following fact sheets that you were given.

## 2018-06-17 ENCOUNTER — Encounter (HOSPITAL_COMMUNITY)
Admission: RE | Admit: 2018-06-17 | Discharge: 2018-06-17 | Disposition: A | Discharge status: Home / Self Care | Payer: Medicare Other | Source: Ambulatory Visit | Attending: Surgery | Admitting: Surgery

## 2018-06-17 ENCOUNTER — Encounter (HOSPITAL_COMMUNITY): Payer: Self-pay

## 2018-06-17 ENCOUNTER — Other Ambulatory Visit: Payer: Self-pay

## 2018-06-17 DIAGNOSIS — C774 Secondary and unspecified malignant neoplasm of inguinal and lower limb lymph nodes: Secondary | ICD-10-CM | POA: Diagnosis not present

## 2018-06-17 DIAGNOSIS — Z7901 Long term (current) use of anticoagulants: Secondary | ICD-10-CM | POA: Diagnosis not present

## 2018-06-17 DIAGNOSIS — M1990 Unspecified osteoarthritis, unspecified site: Secondary | ICD-10-CM | POA: Diagnosis not present

## 2018-06-17 DIAGNOSIS — Z7902 Long term (current) use of antithrombotics/antiplatelets: Secondary | ICD-10-CM | POA: Diagnosis not present

## 2018-06-17 DIAGNOSIS — Z6841 Body Mass Index (BMI) 40.0 and over, adult: Secondary | ICD-10-CM | POA: Diagnosis not present

## 2018-06-17 DIAGNOSIS — I739 Peripheral vascular disease, unspecified: Secondary | ICD-10-CM | POA: Diagnosis not present

## 2018-06-17 DIAGNOSIS — R590 Localized enlarged lymph nodes: Secondary | ICD-10-CM | POA: Diagnosis present

## 2018-06-17 DIAGNOSIS — Z86718 Personal history of other venous thrombosis and embolism: Secondary | ICD-10-CM | POA: Diagnosis not present

## 2018-06-17 DIAGNOSIS — R1900 Intra-abdominal and pelvic swelling, mass and lump, unspecified site: Secondary | ICD-10-CM | POA: Diagnosis present

## 2018-06-17 DIAGNOSIS — Z95820 Peripheral vascular angioplasty status with implants and grafts: Secondary | ICD-10-CM | POA: Diagnosis not present

## 2018-06-17 DIAGNOSIS — Z01812 Encounter for preprocedural laboratory examination: Secondary | ICD-10-CM

## 2018-06-17 DIAGNOSIS — I10 Essential (primary) hypertension: Secondary | ICD-10-CM | POA: Diagnosis not present

## 2018-06-17 DIAGNOSIS — Z87891 Personal history of nicotine dependence: Secondary | ICD-10-CM | POA: Diagnosis not present

## 2018-06-17 DIAGNOSIS — C801 Malignant (primary) neoplasm, unspecified: Secondary | ICD-10-CM | POA: Diagnosis not present

## 2018-06-17 LAB — CBC
HCT: 35.8 % — ABNORMAL LOW (ref 36.0–46.0)
Hemoglobin: 11.2 g/dL — ABNORMAL LOW (ref 12.0–15.0)
MCH: 27.1 pg (ref 26.0–34.0)
MCHC: 31.3 g/dL (ref 30.0–36.0)
MCV: 86.7 fL (ref 80.0–100.0)
Platelets: 293 10*3/uL (ref 150–400)
RBC: 4.13 MIL/uL (ref 3.87–5.11)
RDW: 13.5 % (ref 11.5–15.5)
WBC: 5.7 10*3/uL (ref 4.0–10.5)
nRBC: 0 % (ref 0.0–0.2)

## 2018-06-17 LAB — PROTIME-INR
INR: 1.4 — ABNORMAL HIGH (ref 0.8–1.2)
Prothrombin Time: 16.8 seconds — ABNORMAL HIGH (ref 11.4–15.2)

## 2018-06-17 LAB — SURGICAL PCR SCREEN
MRSA, PCR: NEGATIVE
Staphylococcus aureus: NEGATIVE

## 2018-06-17 LAB — BASIC METABOLIC PANEL
Anion gap: 13 (ref 5–15)
BUN: 9 mg/dL (ref 8–23)
CO2: 22 mmol/L (ref 22–32)
Calcium: 9.5 mg/dL (ref 8.9–10.3)
Chloride: 101 mmol/L (ref 98–111)
Creatinine, Ser: 0.65 mg/dL (ref 0.44–1.00)
GFR calc Af Amer: >60 mL/min (ref >60)
GFR calc non Af Amer: >60 mL/min (ref >60)
Glucose, Bld: 108 mg/dL — ABNORMAL HIGH (ref 70–99)
Potassium: 3.1 mmol/L — ABNORMAL LOW (ref 3.5–5.1)
Sodium: 136 mmol/L (ref 135–145)

## 2018-06-17 NOTE — Progress Notes (Signed)

## 2018-06-17 NOTE — Progress Notes (Signed)
Pt PT-INR labs came back abnormal. Jeneen Rinks notified. Left VM with Dr. Trevor Mace office informing them of abnormal labs.

## 2018-06-17 NOTE — H&P (Signed)
Sahara Fujimoto Corporan Documented: 06/12/2018 10:38 AM Location: [facility] Patient #: [id number] DOB: 10/09/1945 Single / Language: Cleophus Molt / Race: Black or African American Female   History of Present Illness (Kjuan Seipp A. Ninfa Linden MD; 06/12/2018 10:54 AM) The patient is a 73 year old female who presents with lymphadenopathy. This patient is referred by Dr. Libby Maw for evaluation of enlarged left inguinal lymph node and pelvic mass. 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 She underwent a left common iliac vein stenting. This was done in December. Because of continued swelling in her leg, she underwent a CAT scan of the abdomen and pelvis last week showing a large almost 10 cm mass in the left pelvis along the sidewall and an enlarged left inguinal lymph node measuring almost 6 cm. The pelvic mass was encasing the blood vessels and may be causing a pathologic fracture at the acetabulum. She has been referred to general surgery to consider biopsy for histologic evaluation. She has no fevers or night sweats. She reports that she can feel the large lymph node in the left inguinal area and it is causing significant discomfort. She has no chest pain or shortness of breath. She has had multiple surgical procedures and is tolerated anesthesia well. She has had significant weight loss. Her pain in her groin and leg are moderate in intensity.   Past Surgical History Geni Bers [place], RMA; 06/12/2018 10:38 AM) Gallbladder Surgery - Open  Knee Surgery  Bilateral.  Diagnostic Studies History Geni Bers [place], RMA; 06/12/2018 10:38 AM) Mammogram  within last year Pap Smear  >5 years ago  Allergies Geni Bers [place], RMA; 06/12/2018 10:38 AM) No Known Drug Allergies [06/12/2018]: Allergies Reconciled   Medication History [employer], RMA; 06/12/2018 10:39 AM) Acetaminophen-Codeine #3 (300-30MG   Tablet, Oral) Active. Medications Reconciled  Social History Geni Bers [place], RMA; 06/12/2018 10:38 AM) Alcohol use  Remotely quit alcohol use.  Family History Geni Bers [place], RMA; 06/12/2018 10:38 AM) Arthritis  Mother.  Other Problems Geni Bers [place], RMA; 06/12/2018 10:38 AM) Arthritis     Review of Systems Geni Bers [place] RMA; 06/12/2018 10:38 AM) Neurological Present- Trouble walking. Not Present- Decreased Memory, Fainting, Headaches, Numbness, Seizures, Tingling, Tremor and Weakness.  Vitals [employer] [place] RMA; 06/12/2018 10:39 AM) 06/12/2018 10:39 AM Weight: 227.6 lb Height: 61in Body Surface Area: 2 m Body Mass Index: 43 kg/m  Temp.: 98.51F(Oral)  Pulse: 73 (Regular)  P.OX: 91% (Room air) BP: 142/80 (Sitting, Left Arm, Standard)       Physical Exam (Ramaj Frangos A. Ninfa Linden MD; 06/12/2018 10:55 AM) General Mental Status-Alert. General Appearance-Consistent with stated age. Hydration-Well hydrated. Voice-Normal.  Head and Neck Head-normocephalic, atraumatic with no lesions or palpable masses. Trachea-midline. Thyroid Gland Characteristics - normal size and consistency.  Eye Eyeball - Bilateral-Extraocular movements intact. Sclera/Conjunctiva - Bilateral-No scleral icterus.  Chest and Lung Exam Chest and lung exam reveals -quiet, even and easy respiratory effort with no use of accessory muscles and on auscultation, normal breath sounds, no adventitious sounds and normal vocal resonance. Inspection Chest Wall - Normal. Back - normal.  Breast - Did not examine.  Cardiovascular Cardiovascular examination reveals -normal heart sounds, regular rate and rhythm with no murmurs and normal pedal pulses bilaterally.  Abdomen Inspection Inspection of the abdomen reveals - No Hernias. Skin - Scar - no surgical scars. Palpation/Percussion Palpation and Percussion of the abdomen reveal - Soft, Non Tender, No Rebound  tenderness, No Rigidity (guarding) and No hepatosplenomegaly. Auscultation Auscultation of  the abdomen reveals - Bowel sounds normal. Note: There are no palpable abdominal masses   Neurologic - Did not examine.  Musculoskeletal Note: She is walking with a walker   Lymphatic Head & Neck  General Head & Neck Lymphatics: Bilateral - Description - Normal. Axillary  General Axillary Region: Bilateral - Description - Normal. Tenderness - Non Tender. Femoral & Inguinal  Generalized Femoral & Inguinal Lymphatics: Bilateral - Description - Normal. Tenderness - Non Tender. Note: There is an enlarged medial left inguinal lymph node which is tender    Assessment & Plan (Yoshimi Sarr A. Ninfa Linden MD; 06/12/2018 10:57 AM) Percell Belt, INGUINAL (R59.0) Impression: I reviewed the patients notes and Epic and have reviewed her CT scan. 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 I discussed this with her in detail. We discussed the options which include excisional biopsy of the left inguinal lymph node see if this will help with the histologic evaluation and diagnosis of the probable malignancy. The only other option would be interventional radiology during a needle biopsy. I believe she would benefit from the lymph node being removed. She would like to have it removed to hopefully relieve some of her discomfort. I discussed the risks of surgery in detail. These include but are not limited to bleeding, infection, the need for drain placement, the need for further interventions should a chronic seroma develop, the biopsy being nondiagnostic, cardio pulmonary issues, etc. She understands and wishes to proceed with surgery as soon as possible. Surgery is scheduled urgently given that this is high risk for malignancy PELVIC MASS IN FEMALE (R19.00)

## 2018-06-17 NOTE — Anesthesia Preprocedure Evaluation (Addendum)
Anesthesia Evaluation  Patient identified by MRN, date of birth, ID band Patient awake    Reviewed: Allergy & Precautions, NPO status , Patient's Chart, lab work & pertinent test results  Airway Mallampati: II  TM Distance: >3 FB Neck ROM: Full    Dental no notable dental hx. (+) Teeth Intact, Dental Advisory Given   Pulmonary neg pulmonary ROS, former smoker,    Pulmonary exam normal breath sounds clear to auscultation       Cardiovascular hypertension, + Peripheral Vascular Disease and + DVT  Normal cardiovascular exam Rhythm:Regular Rate:Normal     Neuro/Psych negative neurological ROS  negative psych ROS   GI/Hepatic Neg liver ROS, GERD  ,  Endo/Other  Morbid obesity  Renal/GU negative Renal ROS  negative genitourinary   Musculoskeletal  (+) Arthritis ,   Abdominal   Peds  Hematology  (+) Blood dyscrasia, anemia ,   Anesthesia Other Findings On plavix, xarelto  Reproductive/Obstetrics                           Anesthesia Physical Anesthesia Plan  ASA: III  Anesthesia Plan: General   Post-op Pain Management:    Induction: Intravenous  PONV Risk Score and Plan: 3 and Ondansetron, Dexamethasone and Treatment may vary due to age or medical condition  Airway Management Planned: Oral ETT and LMA  Additional Equipment:   Intra-op Plan:   Post-operative Plan: Extubation in OR  Informed Consent: I have reviewed the patients History and Physical, chart, labs and discussed the procedure including the risks, benefits and alternatives for the proposed anesthesia with the patient or authorized representative who has indicated his/her understanding and acceptance.     Dental advisory given  Plan Discussed with: CRNA  Anesthesia Plan Comments:         Anesthesia Quick Evaluation

## 2018-06-17 NOTE — Progress Notes (Signed)
PCP - [place] Cardiologist - Denies  Chest x-ray - NA EKG - 12-08-17(Epic) Stress Test - NA  ECHO - 10/22/11. Unable to request records. Pt cannot recall where or when tests were done.  Cardiac Cath - denies  AICD-denies PM-denies LOOP-denies  Sleep Study - 10-24-2011  CPAP - No  LABS-CBC,BMP,PT-INR  ASA-Denies Plavix- Last dose taken 06-16-2018. Xarelto- Has stopped taking it several weeks back.  HA1C-NA Fasting Blood Sugar -  Checks Blood Sugar ____0_ times a day  Anesthesia-N  Pt denies having chest pain, sob, or fever at this time. All instructions explained to the pt, with a verbal understanding of the material. Pt agrees to go over the instructions while at home for a better understanding. The opportunity to ask questions was provided.

## 2018-06-18 ENCOUNTER — Ambulatory Visit (HOSPITAL_COMMUNITY): Payer: Medicare Other | Admitting: Anesthesiology

## 2018-06-18 ENCOUNTER — Encounter (HOSPITAL_COMMUNITY)
Admission: RE | Disposition: A | Discharge status: Home / Self Care | Payer: Self-pay | Source: Home / Self Care | Attending: Surgery

## 2018-06-18 ENCOUNTER — Other Ambulatory Visit: Payer: Self-pay

## 2018-06-18 ENCOUNTER — Ambulatory Visit (HOSPITAL_COMMUNITY): Payer: Medicare Other | Admitting: Physician Assistant

## 2018-06-18 ENCOUNTER — Ambulatory Visit (HOSPITAL_COMMUNITY)
Admission: RE | Admit: 2018-06-18 | Discharge: 2018-06-18 | Disposition: A | Discharge status: Home / Self Care | Payer: Medicare Other | Attending: Surgery | Admitting: Surgery

## 2018-06-18 ENCOUNTER — Encounter (HOSPITAL_COMMUNITY): Payer: Self-pay

## 2018-06-18 DIAGNOSIS — Z95820 Peripheral vascular angioplasty status with implants and grafts: Secondary | ICD-10-CM | POA: Insufficient documentation

## 2018-06-18 DIAGNOSIS — Z87891 Personal history of nicotine dependence: Secondary | ICD-10-CM | POA: Insufficient documentation

## 2018-06-18 DIAGNOSIS — M1990 Unspecified osteoarthritis, unspecified site: Secondary | ICD-10-CM | POA: Insufficient documentation

## 2018-06-18 DIAGNOSIS — Z6841 Body Mass Index (BMI) 40.0 and over, adult: Secondary | ICD-10-CM | POA: Insufficient documentation

## 2018-06-18 DIAGNOSIS — Z7902 Long term (current) use of antithrombotics/antiplatelets: Secondary | ICD-10-CM | POA: Insufficient documentation

## 2018-06-18 DIAGNOSIS — Z7901 Long term (current) use of anticoagulants: Secondary | ICD-10-CM | POA: Insufficient documentation

## 2018-06-18 DIAGNOSIS — I10 Essential (primary) hypertension: Secondary | ICD-10-CM | POA: Diagnosis not present

## 2018-06-18 DIAGNOSIS — I739 Peripheral vascular disease, unspecified: Secondary | ICD-10-CM | POA: Insufficient documentation

## 2018-06-18 DIAGNOSIS — Z86718 Personal history of other venous thrombosis and embolism: Secondary | ICD-10-CM | POA: Diagnosis not present

## 2018-06-18 DIAGNOSIS — C774 Secondary and unspecified malignant neoplasm of inguinal and lower limb lymph nodes: Secondary | ICD-10-CM | POA: Diagnosis not present

## 2018-06-18 DIAGNOSIS — C801 Malignant (primary) neoplasm, unspecified: Secondary | ICD-10-CM | POA: Insufficient documentation

## 2018-06-18 HISTORY — PX: LYMPH NODE BIOPSY: SHX201

## 2018-06-18 LAB — PROTIME-INR
INR: 1.4 — ABNORMAL HIGH (ref 0.8–1.2)
Prothrombin Time: 16.8 seconds — ABNORMAL HIGH (ref 11.4–15.2)

## 2018-06-18 SURGERY — LYMPH NODE BIOPSY
Anesthesia: General | Site: Groin | Laterality: Left

## 2018-06-18 MED ORDER — ONDANSETRON HCL 4 MG/2ML IJ SOLN
INTRAMUSCULAR | Status: DC | PRN
  Administered 2018-06-18: 4 mg via INTRAVENOUS

## 2018-06-18 MED ORDER — ACETAMINOPHEN 500 MG PO TABS
1000.0000 mg | ORAL_TABLET | ORAL | Status: AC
  Administered 2018-06-18: 06:00:00 1000 mg via ORAL

## 2018-06-18 MED ORDER — CHLORHEXIDINE GLUCONATE CLOTH 2 % EX PADS: 6.0000 | MEDICATED_PAD | Freq: Once | CUTANEOUS | Status: DC

## 2018-06-18 MED ORDER — GABAPENTIN 300 MG PO CAPS
ORAL_CAPSULE | ORAL | Status: AC
  Administered 2018-06-18: 300 mg via ORAL
  Filled 2018-06-18: qty 1

## 2018-06-18 MED ORDER — TRAMADOL HCL 50 MG PO TABS
50.0000 mg | ORAL_TABLET | Freq: Once | ORAL | Status: AC
  Administered 2018-06-18: 50 mg via ORAL

## 2018-06-18 MED ORDER — TRAMADOL HCL 50 MG PO TABS: 50.0000 mg | ORAL_TABLET | Freq: Four times a day (QID) | ORAL | 0 refills | Status: DC | PRN

## 2018-06-18 MED ORDER — LIDOCAINE 2% (20 MG/ML) 5 ML SYRINGE
INTRAMUSCULAR | Status: DC | PRN
  Administered 2018-06-18: 100 mg via INTRAVENOUS

## 2018-06-18 MED ORDER — LIDOCAINE 2% (20 MG/ML) 5 ML SYRINGE
INTRAMUSCULAR | Status: AC
  Filled 2018-06-18: qty 5

## 2018-06-18 MED ORDER — 0.9 % SODIUM CHLORIDE (POUR BTL) OPTIME
TOPICAL | Status: DC | PRN
  Administered 2018-06-18: 08:00:00 1000 mL

## 2018-06-18 MED ORDER — GABAPENTIN 300 MG PO CAPS
300.0000 mg | ORAL_CAPSULE | ORAL | Status: AC
  Administered 2018-06-18: 06:00:00 300 mg via ORAL

## 2018-06-18 MED ORDER — FENTANYL CITRATE (PF) 250 MCG/5ML IJ SOLN
INTRAMUSCULAR | Status: AC
  Filled 2018-06-18: qty 5

## 2018-06-18 MED ORDER — ONDANSETRON HCL 4 MG/2ML IJ SOLN
INTRAMUSCULAR | Status: AC
  Filled 2018-06-18: qty 2

## 2018-06-18 MED ORDER — LACTATED RINGERS IV SOLN
INTRAVENOUS | Status: DC | PRN
  Administered 2018-06-18: 07:00:00 via INTRAVENOUS

## 2018-06-18 MED ORDER — BUPIVACAINE-EPINEPHRINE 0.5% -1:200000 IJ SOLN
INTRAMUSCULAR | Status: DC | PRN
  Administered 2018-06-18: 18 mL

## 2018-06-18 MED ORDER — FENTANYL CITRATE (PF) 100 MCG/2ML IJ SOLN
INTRAMUSCULAR | Status: DC | PRN
  Administered 2018-06-18: 50 ug via INTRAVENOUS
  Administered 2018-06-18 (×2): 25 ug via INTRAVENOUS

## 2018-06-18 MED ORDER — CEFAZOLIN SODIUM-DEXTROSE 2-4 GM/100ML-% IV SOLN
INTRAVENOUS | Status: AC
  Filled 2018-06-18: qty 100

## 2018-06-18 MED ORDER — DEXAMETHASONE SODIUM PHOSPHATE 10 MG/ML IJ SOLN
INTRAMUSCULAR | Status: DC | PRN
  Administered 2018-06-18: 5 mg via INTRAVENOUS

## 2018-06-18 MED ORDER — PROPOFOL 10 MG/ML IV BOLUS
INTRAVENOUS | Status: AC
  Filled 2018-06-18: qty 20

## 2018-06-18 MED ORDER — ACETAMINOPHEN 500 MG PO TABS
ORAL_TABLET | ORAL | Status: AC
  Administered 2018-06-18: 1000 mg via ORAL
  Filled 2018-06-18: qty 2

## 2018-06-18 MED ORDER — BUPIVACAINE-EPINEPHRINE (PF) 0.5% -1:200000 IJ SOLN
INTRAMUSCULAR | Status: AC
  Filled 2018-06-18: qty 30

## 2018-06-18 MED ORDER — TRAMADOL HCL 50 MG PO TABS
ORAL_TABLET | ORAL | Status: AC
  Filled 2018-06-18: qty 1

## 2018-06-18 MED ORDER — PROPOFOL 10 MG/ML IV BOLUS
INTRAVENOUS | Status: DC | PRN
  Administered 2018-06-18: 150 mg via INTRAVENOUS
  Administered 2018-06-18: 50 mg via INTRAVENOUS

## 2018-06-18 MED ORDER — DEXAMETHASONE SODIUM PHOSPHATE 10 MG/ML IJ SOLN
INTRAMUSCULAR | Status: AC
  Filled 2018-06-18: qty 1

## 2018-06-18 MED ORDER — CEFAZOLIN SODIUM-DEXTROSE 2-4 GM/100ML-% IV SOLN
2.0000 g | INTRAVENOUS | Status: AC
  Administered 2018-06-18: 08:00:00 2 g via INTRAVENOUS

## 2018-06-18 SURGICAL SUPPLY — 39 items
ADH SKN CLS APL DERMABOND .7 (GAUZE/BANDAGES/DRESSINGS) ×1
APPLIER CLIP 9.375 MED OPEN (MISCELLANEOUS) ×2
APR CLP MED 9.3 20 MLT OPN (MISCELLANEOUS) ×1
CHLORAPREP W/TINT 10.5 ML (MISCELLANEOUS) ×2 IMPLANT
CLIP APPLIE 9.375 MED OPEN (MISCELLANEOUS) IMPLANT
CONT SPEC 4OZ CLIKSEAL STRL BL (MISCELLANEOUS) ×2 IMPLANT
COVER SURGICAL LIGHT HANDLE (MISCELLANEOUS) ×2 IMPLANT
COVER WAND RF STERILE (DRAPES) ×2 IMPLANT
DECANTER SPIKE VIAL GLASS SM (MISCELLANEOUS) ×1 IMPLANT
DERMABOND ADVANCED (GAUZE/BANDAGES/DRESSINGS) ×1
DERMABOND ADVANCED .7 DNX12 (GAUZE/BANDAGES/DRESSINGS) ×1 IMPLANT
DRAPE LAPAROTOMY 100X72 PEDS (DRAPES) ×2 IMPLANT
ELECT BLADE 4.0 EZ CLEAN MEGAD (MISCELLANEOUS) ×2
ELECT REM PT RETURN 9FT ADLT (ELECTROSURGICAL) ×2
ELECTRODE BLDE 4.0 EZ CLN MEGD (MISCELLANEOUS) IMPLANT
ELECTRODE REM PT RTRN 9FT ADLT (ELECTROSURGICAL) ×1 IMPLANT
GAUZE 4X4 16PLY RFD (DISPOSABLE) ×2 IMPLANT
GLOVE BIOGEL PI IND STRL 6.5 (GLOVE) IMPLANT
GLOVE BIOGEL PI INDICATOR 6.5 (GLOVE) ×1
GLOVE SURG SIGNA 7.5 PF LTX (GLOVE) ×2 IMPLANT
GLOVE SURG SS PI 6.5 STRL IVOR (GLOVE) ×1 IMPLANT
GOWN STRL REUS W/ TWL LRG LVL3 (GOWN DISPOSABLE) ×1 IMPLANT
GOWN STRL REUS W/ TWL XL LVL3 (GOWN DISPOSABLE) ×1 IMPLANT
GOWN STRL REUS W/TWL LRG LVL3 (GOWN DISPOSABLE) ×2
GOWN STRL REUS W/TWL XL LVL3 (GOWN DISPOSABLE) ×2
KIT BASIN OR (CUSTOM PROCEDURE TRAY) ×2 IMPLANT
KIT TURNOVER KIT B (KITS) ×2 IMPLANT
NDL HYPO 25GX1X1/2 BEV (NEEDLE) ×1 IMPLANT
NEEDLE HYPO 25GX1X1/2 BEV (NEEDLE) ×2 IMPLANT
NS IRRIG 1000ML POUR BTL (IV SOLUTION) ×2 IMPLANT
PACK GENERAL/GYN (CUSTOM PROCEDURE TRAY) ×2 IMPLANT
PAD ARMBOARD 7.5X6 YLW CONV (MISCELLANEOUS) ×2 IMPLANT
PENCIL SMOKE EVACUATOR (MISCELLANEOUS) ×2 IMPLANT
SUT MNCRL AB 4-0 PS2 18 (SUTURE) ×2 IMPLANT
SUT SILK 2 0SH CR/8 30 (SUTURE) ×1 IMPLANT
SUT VIC AB 3-0 SH 27 (SUTURE) ×2
SUT VIC AB 3-0 SH 27XBRD (SUTURE) ×1 IMPLANT
SYR CONTROL 10ML LL (SYRINGE) ×2 IMPLANT
TOWEL GREEN STERILE FF (TOWEL DISPOSABLE) ×1 IMPLANT

## 2018-06-18 NOTE — Transfer of Care (Signed)
Immediate Anesthesia Transfer of Care Note  Patient: [name]  Procedure(s) Performed: EXCISIONAL BIOPSY LEFT INGUINAL LYMPH NODE (Left Groin)  Patient Location: PACU  Anesthesia Type:General  Level of Consciousness: awake, alert  and oriented  Airway & Oxygen Therapy: Patient Spontanous Breathing and Patient connected to face mask oxygen  Post-op Assessment: Report given to RN and Post -op Vital signs reviewed and stable  Post vital signs: Reviewed and stable  Last Vitals:  Vitals Value Taken Time  BP    Temp    Pulse    Resp    SpO2      Last Pain:  Vitals:   06/18/18 0603  TempSrc:   PainSc: 5          Complications: No apparent anesthesia complications

## 2018-06-18 NOTE — Interval H&P Note (Signed)
History and Physical Interval Note: no change in H and P  06/18/2018 7:15 AM  [name]  has presented today for surgery, with the diagnosis of INGUINAL LYMPHADENOPATHY, PELVIC MASS.  The various methods of treatment have been discussed with the patient and family. After consideration of risks, benefits and other options for treatment, the patient has consented to  Procedure(s): EXCISIONAL BIOPSY LEFT INGUINAL LYMPH NODE (Left) as a surgical intervention.  The patient's history has been reviewed, patient examined, no change in status, stable for surgery.  I have reviewed the patient's chart and labs.  Questions were answered to the patient's satisfaction.     Coralie Keens

## 2018-06-18 NOTE — Anesthesia Procedure Notes (Signed)
Procedure Name: LMA Insertion Date/Time: 06/18/2018 7:39 AM Performed by: Trinna Post., CRNA Pre-anesthesia Checklist: Patient identified, Emergency Drugs available, Suction available, Patient being monitored and Timeout performed Patient Re-evaluated:Patient Re-evaluated prior to induction Oxygen Delivery Method: Circle system utilized Preoxygenation: Pre-oxygenation with 100% oxygen Induction Type: IV induction LMA: LMA inserted LMA Size: 4.0 Number of attempts: 1 Placement Confirmation: positive ETCO2 and breath sounds checked- equal and bilateral Tube secured with: Tape Dental Injury: Teeth and Oropharynx as per pre-operative assessment

## 2018-06-18 NOTE — Anesthesia Postprocedure Evaluation (Signed)
Anesthesia Post Note  Patient: Lasasha Brophy Garringer  Procedure(s) Performed: EXCISIONAL BIOPSY LEFT INGUINAL LYMPH NODE (Left Groin)     Patient location during evaluation: PACU Anesthesia Type: General Level of consciousness: awake and alert Pain management: pain level controlled Vital Signs Assessment: post-procedure vital signs reviewed and stable Respiratory status: spontaneous breathing, nonlabored ventilation, respiratory function stable and patient connected to nasal cannula oxygen Cardiovascular status: blood pressure returned to baseline and stable Postop Assessment: no apparent nausea or vomiting Anesthetic complications: no    Last Vitals:  Vitals:   06/18/18 0843 06/18/18 0857  BP: (!) 153/74 (!) 151/73  Pulse:    Resp: 20 18  Temp:  (!) 36.1 C  SpO2:      Last Pain:  Vitals:   06/18/18 0830  TempSrc:   PainSc: 0-No pain                 Laquon Emel L Madeline Bebout

## 2018-06-18 NOTE — Op Note (Signed)
EXCISIONAL BIOPSY DEEP LEFT INGUINAL LYMPH NODE  Procedure Note  [name] 06/18/2018   Pre-op Diagnosis: INGUINAL LYMPHADENOPATHY, PELVIC MASS     Post-op Diagnosis: same  Procedure(s): EXCISIONAL BIOPSY DEEP LEFT INGUINAL LYMPH NODE  Surgeon(s): Coralie Keens, MD  Anesthesia: General  Staff:  Circulator: Marliss Czar, RN Scrub Person: Teschner, Mindy K, CST  Estimated Blood Loss: Minimal               Specimens: sent to path  Indications: This is a 73 year old female who initially presented with edema and possible DVT of the left lower extremity.  She had undergone stenting of her left iliac vein.  Because of ongoing discomfort and swelling, a CT scan was performed showing a large mass along the left pelvic sidewall encasing the blood vessels and potentially eroding into the acetabulum.  She also had an enlarged lymph node in the left inguinal area measuring almost 6 cm.  The decision was made to proceed with an incisional biopsy of this lymph node for diagnostic purposes  Procedure: The patient was brought to the operating room and identified the correct patient.  She was placed upon the operating room table and general anesthesia was induced.  Her left inguinal area and thigh were then prepped and draped in usual sterile fashion.  The hard, palpable mass was just below the inguinal ligament.  I anesthetized skin with Marcaine and made incision with a scalpel.  I then dissected down to the deep inguinal tissue.  The lymph node was 6 cm in size and hard.  I slowly excised it circumferentially with both the cautery and surgical clips.  I was finally able to identify the base of the lymph node and again clipped it with surgical clips and then was able to completely remove the large lymph node.  Again, this was in the deep inguinal tissue on the most proximal thigh.  It was medial to the blood vessels.  The lymph node was sent to pathology for evaluation.  I irrigated the wound  with saline.  I anesthetized it further with Marcaine.  Hemostasis appeared to be achieved.  I then closed the incision in 2 layers of 3-0 Vicryl sutures and then a running 4-0 Monocryl suture.  Dermabond was then applied.  The patient tolerated procedure well.  All the counts were correct at the end of the procedure.  The patient was then extubated in the operating room and taken in a stable condition to the recovery room.          Coralie Keens   Date: 06/18/2018  Time: 8:26 AM

## 2018-06-18 NOTE — Discharge Instructions (Signed)
It is ok to shower starting tomorrow  You may take Tylenol also for pain and use an ice pack or heating pad  No vigorous activity for 1 week  I will call you as soon as I know the pathology results on the lymph node

## 2018-06-19 ENCOUNTER — Encounter (HOSPITAL_COMMUNITY): Payer: Self-pay | Admitting: Surgery

## 2018-06-23 ENCOUNTER — Encounter: Payer: Self-pay | Admitting: Hematology and Oncology

## 2018-06-23 ENCOUNTER — Encounter: Payer: Self-pay | Admitting: Oncology

## 2018-06-23 ENCOUNTER — Other Ambulatory Visit: Payer: Self-pay | Admitting: Hematology and Oncology

## 2018-06-23 ENCOUNTER — Telehealth: Payer: Self-pay | Admitting: Oncology

## 2018-06-23 DIAGNOSIS — I825Z2 Chronic embolism and thrombosis of unspecified deep veins of left distal lower extremity: Secondary | ICD-10-CM | POA: Insufficient documentation

## 2018-06-23 DIAGNOSIS — C55 Malignant neoplasm of uterus, part unspecified: Secondary | ICD-10-CM

## 2018-06-23 DIAGNOSIS — C779 Secondary and unspecified malignant neoplasm of lymph node, unspecified: Secondary | ICD-10-CM | POA: Insufficient documentation

## 2018-06-23 HISTORY — DX: Malignant neoplasm of uterus, part unspecified: C55

## 2018-06-23 NOTE — Telephone Encounter (Signed)
Ok no problem.  

## 2018-06-23 NOTE — Progress Notes (Signed)
   [name] presented with symptoms of: 73 year old female status post left common iliac vein stenting had persistent swelling underwent CT scan which demonstrates a large pelvic wall mass with likely associated lymph node which we have previously seen on ultrasound that led to to the CT scan being performed.  This is certainly concerning for malignancy and I discussed this with her patient and her son and we have reviewed the CT scan together.  I have spoken with her PCP Dr. Jeanie Cooks and he will make a referral for biopsy.    CT Angio Abdomen Pelvis 06/01/18:  IMPRESSION: 1. 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 Additionally, there is lucency involving the medial sidewall of the left acetabulum with potential nondisplaced pathologic fracture. Further evaluation with contrast-enhanced pelvic MRI could be performed as clinically indicated. 2. The left pelvic arterial and venous system is encased by this infiltrative left pelvic sidewall mass however while difficult to ascertain, the left external iliac venous stent appears patent.  Biopsies revealed: 06/18/18:  Diagnosis Lymph node for lymphoma, Left Inguinal - METASTATIC ADENOCARCINOMA, SEE COMMENT. Microscopic Comment Immunohistochemistry is positive for cytokeratin 7, PAX8, ER, and PR. Cytokeratin 5/6,and p63 are negative. The immunoprofile along with the patient's history are consistent with a gynecologic primary. 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 ER is 90% moderate staining. PR is 100% strong staining. MMR and MSI will be ordered.  Past/Anticipated interventions by Gyn/Onc surgery, if any: Per Dr. Ninfa Linden 06/18/18:  Procedure(s): EXCISIONAL BIOPSY DEEP LEFT INGUINAL LYMPH NODE  Past/Anticipated interventions by medical oncology, if any:  Initial consult Dr. Alvy Bimler 06/24/18  0800  Bowel/Bladder complaints, if any: Denies GU symptoms complains of constipation given meds by another provider  Nausea/Vomiting, if any: no  Pain issues, if any:  Left leg is swollen skin appears taut patient rates pain 7/10  SAFETY ISSUES:  Prior radiation? No  Pacemaker/ICD? No  Possible current pregnancy? No  Is the patient on methotrexate? No  Current Complaints / other details:  Patient in for consult had a pelvic by Dr Denman George today also seen Dr Alvy Bimler PET scheduled 07/02/2018

## 2018-06-23 NOTE — Telephone Encounter (Signed)
Darcus Pester and appointment was made to see Dr. Alvy Bimler on 06/24/18 at 8 am with chemo educaiton to follow.  Discussed Covid screening and the no visitor policy.  Also discussed that Radiation Oncology will be calling with an appointment to see Dr. Sondra Come.  Discussed porta caths and that we are working with Dr. Trevor Mace office to schedule port placement. She verbalized understanding and agreement.

## 2018-06-23 NOTE — Telephone Encounter (Signed)
Received message from [place], [place] with Dr. Trevor Mace office.  She said Dr. Ninfa Linden is recommending IR port placement because he is scheduled for emergency surgeries this week and his schedule is booked next week.

## 2018-06-23 NOTE — Telephone Encounter (Signed)
Called [name] and advised her of appointment to see Dr. Denman George at 2 pm tomorrow.  She verbalized understanding and agreement.

## 2018-06-23 NOTE — Telephone Encounter (Signed)
Attempted to call [place] regarding apt to see Dr. Alvy Bimler.  There was no answer/voicemail.  Will try again.  [facility] and spoke to [place], [place] regarding scheduling port placement for chemotherapy.  She is going to send a message to Dr. Ninfa Linden and call back.

## 2018-06-24 ENCOUNTER — Inpatient Hospital Stay (HOSPITAL_BASED_OUTPATIENT_CLINIC_OR_DEPARTMENT_OTHER): Payer: Medicare Other | Admitting: Gynecologic Oncology

## 2018-06-24 ENCOUNTER — Encounter: Payer: Self-pay | Admitting: Oncology

## 2018-06-24 ENCOUNTER — Inpatient Hospital Stay: Payer: Medicare Other

## 2018-06-24 ENCOUNTER — Other Ambulatory Visit: Payer: Self-pay

## 2018-06-24 ENCOUNTER — Ambulatory Visit
Admission: RE | Admit: 2018-06-24 | Discharge: 2018-06-24 | Disposition: A | Discharge status: Home / Self Care | Payer: Medicare Other | Source: Ambulatory Visit | Attending: Radiation Oncology | Admitting: Radiation Oncology

## 2018-06-24 ENCOUNTER — Encounter: Payer: Self-pay | Admitting: Hematology and Oncology

## 2018-06-24 ENCOUNTER — Inpatient Hospital Stay: Payer: Medicare Other | Attending: Hematology and Oncology | Admitting: Hematology and Oncology

## 2018-06-24 ENCOUNTER — Encounter: Payer: Self-pay | Admitting: Gynecologic Oncology

## 2018-06-24 VITALS — BP 143/73 | HR 98 | Temp 98.1°F | Resp 18 | Ht 61.0 in | Wt 225.8 lb

## 2018-06-24 VITALS — BP 158/86 | HR 112 | Temp 98.1°F | Resp 20 | Ht 61.0 in | Wt 225.0 lb

## 2018-06-24 DIAGNOSIS — C772 Secondary and unspecified malignant neoplasm of intra-abdominal lymph nodes: Secondary | ICD-10-CM

## 2018-06-24 DIAGNOSIS — Z79899 Other long term (current) drug therapy: Secondary | ICD-10-CM | POA: Diagnosis not present

## 2018-06-24 DIAGNOSIS — C541 Malignant neoplasm of endometrium: Secondary | ICD-10-CM

## 2018-06-24 DIAGNOSIS — Z87891 Personal history of nicotine dependence: Secondary | ICD-10-CM | POA: Diagnosis not present

## 2018-06-24 DIAGNOSIS — G893 Neoplasm related pain (acute) (chronic): Secondary | ICD-10-CM

## 2018-06-24 DIAGNOSIS — I825Z2 Chronic embolism and thrombosis of unspecified deep veins of left distal lower extremity: Secondary | ICD-10-CM | POA: Diagnosis not present

## 2018-06-24 DIAGNOSIS — R112 Nausea with vomiting, unspecified: Secondary | ICD-10-CM | POA: Insufficient documentation

## 2018-06-24 DIAGNOSIS — D649 Anemia, unspecified: Secondary | ICD-10-CM | POA: Diagnosis not present

## 2018-06-24 DIAGNOSIS — Z803 Family history of malignant neoplasm of breast: Secondary | ICD-10-CM | POA: Diagnosis not present

## 2018-06-24 DIAGNOSIS — C7951 Secondary malignant neoplasm of bone: Secondary | ICD-10-CM | POA: Insufficient documentation

## 2018-06-24 DIAGNOSIS — Z86010 Personal history of colonic polyps: Secondary | ICD-10-CM | POA: Diagnosis not present

## 2018-06-24 DIAGNOSIS — Z5111 Encounter for antineoplastic chemotherapy: Secondary | ICD-10-CM | POA: Diagnosis not present

## 2018-06-24 DIAGNOSIS — C774 Secondary and unspecified malignant neoplasm of inguinal and lower limb lymph nodes: Secondary | ICD-10-CM

## 2018-06-24 DIAGNOSIS — Z7901 Long term (current) use of anticoagulants: Secondary | ICD-10-CM | POA: Insufficient documentation

## 2018-06-24 DIAGNOSIS — R590 Localized enlarged lymph nodes: Secondary | ICD-10-CM | POA: Diagnosis not present

## 2018-06-24 DIAGNOSIS — R600 Localized edema: Secondary | ICD-10-CM | POA: Diagnosis not present

## 2018-06-24 DIAGNOSIS — K219 Gastro-esophageal reflux disease without esophagitis: Secondary | ICD-10-CM | POA: Insufficient documentation

## 2018-06-24 DIAGNOSIS — E876 Hypokalemia: Secondary | ICD-10-CM | POA: Diagnosis not present

## 2018-06-24 DIAGNOSIS — C55 Malignant neoplasm of uterus, part unspecified: Secondary | ICD-10-CM

## 2018-06-24 DIAGNOSIS — I10 Essential (primary) hypertension: Secondary | ICD-10-CM | POA: Diagnosis not present

## 2018-06-24 DIAGNOSIS — Z86718 Personal history of other venous thrombosis and embolism: Secondary | ICD-10-CM | POA: Diagnosis not present

## 2018-06-24 DIAGNOSIS — K5909 Other constipation: Secondary | ICD-10-CM | POA: Insufficient documentation

## 2018-06-24 DIAGNOSIS — M1711 Unilateral primary osteoarthritis, right knee: Secondary | ICD-10-CM | POA: Insufficient documentation

## 2018-06-24 DIAGNOSIS — Z7189 Other specified counseling: Secondary | ICD-10-CM | POA: Insufficient documentation

## 2018-06-24 MED ORDER — MORPHINE SULFATE 15 MG PO TABS: 15.0000 mg | ORAL_TABLET | ORAL | 0 refills | Status: DC | PRN

## 2018-06-24 MED ORDER — POLYETHYLENE GLYCOL 3350 17 G PO PACK: 17.0000 g | PACK | Freq: Every day | ORAL | 11 refills | Status: DC

## 2018-06-24 MED FILL — MORPHINE SULFATE IR 15 MG T: 15 | 5 days supply | Qty: 30 | Fill #0

## 2018-06-24 NOTE — Patient Instructions (Signed)
Dr Denman George is recommending radiation and chemotherapy for your recurrent endometrial cancer.  Dr Denman George will see you for follow-up after you have completed therapy.  Her office can be reached at [phone].

## 2018-06-24 NOTE — Assessment & Plan Note (Signed)
She has severe constipation due to immobility, reduced oral intake and pain We discussed the importance of daily laxatives I also recommend her to document her daily bowel movement.

## 2018-06-24 NOTE — Assessment & Plan Note (Signed)
The patient has significant disease burden Her son is her healthcare power of attorney I recommend further imaging study to assess the stage of her disease The patient would like to be treated as aggressive as possible

## 2018-06-24 NOTE — Progress Notes (Signed)
Radiation Oncology         [phone]) [phone] ________________________________  Initial outpatient consultation  Name: [name] MRN: [id number]  Date: 06/24/2018  DOB: 12-25-1945  MH:[id number], Christean Grief, MD  Dorothyann Gibbs, NP   REFERRING PHYSICIAN: Joylene John D, NP  DIAGNOSIS: Recurrent  grade 2 endometrioid endometrial adenocarcinoma  HISTORY OF PRESENT ILLNESS::[name] is a 73 y.o. female who is seen adequacy of Dr. Denman George consideration for radiation therapy as part of management of patient's recently diagnosed recurrent endometrial cancer.  She consulted with Dr. Alvy Bimler this morning, who reported that around last year, the pt started to have leg swelling and was diagnosed with DVT Her leg swelling does not decrease despite interventional placement of stent in December 2019 and anticoagulation therapy. With subsequent imaging studies, she was noted to have pelvic mass. Biopsy and final pathology confirmed recurrent uterine cancer  The patient's initial tumor history began in April 2012 when she was diagnosed with a FIGO grade 2 endometrioid endometrial adenocarcinoma and treated with a total vaginal hysterectomy and BSO with Dr. Marti Sleigh on Jul 03, 2010.  Final pathology from that surgery revealed an invasive 1.5 cm FIGO grade 2 endometrioid adenocarcinoma arising in a background of complex atypical hyperplasia.  It was confined to the inner half of the myometrium.  Lymphovascular space invasion was not identified.  The ovaries and cervix were benign.  Staging had not been performed.  There was 1 cm of 2.3 cm thickness invasion.  She was staged as FIGO stage Ib grade 2 endometrial adenocarcinoma and in accordance with NCCN guidelines was characterized as low risk for recurrence and therefore no adjuvant therapy was prescribed.  CT Angio Abdomen Pelvis on 06/01/18 showed an infiltrative mass within the left pelvic sidewall measuring approximately 9.5 cm with associated  pathologically enlarged left inguinal lymph node. Additionally, there was lucency involving the medial sidewall of the left acetabulum with potential nondisplaced pathologic fracture. Further evaluation with contrast-enhanced pelvic MRI could be performed as clinically indicated. The left pelvic arterial and venous system was noted to be encased by this infiltrative left pelvic sidewall mass however while difficult to ascertain, the left external iliac venous stent appears patent.  She underwent left inguinal lymph node biopsy on 06/18/18 which revealed metastatic adenocarcinoma. Immunohistochemistry is positive for cytokeratin 7, PAX8, ER, and PR. Cytokeratin 5/6,and p63 are negative. The immunoprofile along with the patient's history are consistent with a gynecologic primary. The morphology is consistent with endometrioid adenocarcinoma. ER is 90% moderate staining. PR is 100% strong staining. MMR and MSI will be ordered.       The patient reports  significant pain with ambulation and movement of the left lower extremity.  She notes significant left lower extremity edema.  She lives alone.  She ambulates with a walking frame.  She is morbidly obese.  Her medical history is notable for her uterine cancer, arthritis of both knees requiring replacement, hypertension, or an anemia.  Patient was started on immediate release morphine by Dr. Alvy Bimler to get better control of her pain.  MiraLAX for her constipation issues.       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  PAST SURGICAL HISTORY: Past Surgical  History:  Procedure Laterality Date  . ABDOMINAL HYSTERECTOMY  2012  . CHOLECYSTECTOMY N/A 03/09/2013   Procedure: LAPAROSCOPIC CHOLECYSTECTOMY;  Surgeon: Gayland Curry, MD;  Location: [facility];  Service: General;  Laterality: N/A;  . COLONOSCOPY W/ BIOPSIES    . LARYNGOSCOPY Left 03/14/2017   Procedure: LARYNGOSCOPY;  Surgeon: Helayne Seminole, MD;  Location: [facility];  Service: ENT;  Laterality: Left;  . LOWER EXTREMITY VENOGRAPHY Left 01/19/2018   Procedure: LOWER EXTREMITY VENOGRAPHY;  Surgeon: Waynetta Sandy, MD;  Location: [facility];  Service: Cardiovascular;  Laterality: Left;  . LYMPH NODE BIOPSY Left 06/18/2018   Procedure: EXCISIONAL BIOPSY LEFT INGUINAL LYMPH NODE;  Surgeon: Coralie Keens, MD;  Location: [facility];  Service: General;  Laterality: Left;  . PERIPHERAL VASCULAR INTERVENTION Left 01/19/2018   Procedure: PERIPHERAL VASCULAR INTERVENTION;  Surgeon: Waynetta Sandy, MD;  Location: [facility];  Service: Cardiovascular;  Laterality: Left;  LEFT ILIAC VENOUS  . TOTAL KNEE ARTHROPLASTY  04/29/2011   Procedure: TOTAL KNEE ARTHROPLASTY;  Surgeon: Lorn Junes, MD;  Location: [facility];  Service: Orthopedics;  Laterality: Left;  DR [place] THIS CASE  . TOTAL KNEE ARTHROPLASTY Right 07/04/2014   Procedure: TOTAL KNEE ARTHROPLASTY;  Surgeon: Elsie Saas, MD;  Location: [facility];  Service: Orthopedics;  Laterality: Right;    FAMILY HISTORY: family history includes Alzheimer's disease in her father; Arthritis in her mother; Breast cancer in an other family member; Diabetes in her sister; Hypertension in her brother, brother, mother, sister, sister, sister, and sister; Stroke in her brother.  SOCIAL HISTORY:  reports that she quit smoking about 30 years ago. She quit after 1.00 year of use. She has never used smokeless tobacco. She reports that she does not drink alcohol or use drugs.  ALLERGIES: Patient has no known allergies.   MEDICATIONS:  Current Outpatient Medications  Medication Sig Dispense Refill  . cycloSPORINE (RESTASIS) 0.05 % ophthalmic emulsion Place 1 drop into both eyes 2 (two) times daily.    . Olopatadine HCl 0.2 % SOLN Place 1 drop into both eyes daily.    . clopidogrel (PLAVIX) 75 MG tablet Take 1 tablet (75 mg total) by mouth daily with breakfast. (Patient not taking: Reported on 06/24/2018) 30 tablet 11  . morphine (MSIR) 15 MG tablet Take 1 tablet (15 mg total) by mouth every 4 (four) hours as needed for severe pain. (Patient not taking: Reported on 06/24/2018) 30 tablet 0  . polyethylene glycol (MIRALAX / GLYCOLAX) 17 g packet Take 17 g by mouth daily. (Patient not taking: Reported on 06/24/2018) 30 each 11  . rivaroxaban (XARELTO) 20 MG TABS tablet Take 20 mg by mouth daily with breakfast.     No current facility-administered medications for this encounter.     REVIEW OF SYSTEMS:  REVIEW OF SYSTEMS: A 10+ POINT REVIEW OF SYSTEMS WAS OBTAINED including neurology, dermatology, psychiatry, cardiac, respiratory, lymph, extremities, GI, GU, musculoskeletal, constitutional, reproductive, HEENT. All pertinent positives are noted in the HPI. All others are negative.  PHYSICAL EXAM:  Vitals:  Blood pressure (!) 158/86, pulse (!) 112, temperature 98.1 F (36.7 C), temperature source Oral, resp. rate 20, height '5\' 1"'  (1.549 m), weight 225 lb (102.1 kg), SpO2 100 %. General: Alert and oriented, in no acute distress, remains in a wheelchair for the entire evaluation.  Patient is quite fatigued at this time after undergoing consultations with medical oncology and gynecologic oncology  earlier today HEENT: Head is normocephalic. Extraocular movements are intact. Oropharynx is clear. Neck: Neck is supple, no palpable cervical or supraclavicular lymphadenopathy. Heart: Regular in rate and rhythm with no murmurs, rubs, or gallops. Chest: Clear to auscultation bilaterally, with no rhonchi, wheezes, or rales. Abdomen:  Soft, nontender, nondistended, with no rigidity or guarding. Extremities: Edema noted in both lower extremities but much more significant on the left side with tense pitting edema. Skin: No concerning lesions. Musculoskeletal: symmetric strength and muscle tone throughout.  Neurologic: Cranial nerves II through XII are grossly intact. No obvious focalities. Speech is fluent. Coordination is intact. Psychiatric: Judgment and insight are intact. Affect is appropriate. Pelvic exam deferred in light of her recent exam earlier today by Dr. Denman George.  Pelvic exam results reviewed    ECOG = 2  0 - Asymptomatic (Fully active, able to carry on all predisease activities without restriction)  1 - Symptomatic but completely ambulatory (Restricted in physically strenuous activity but ambulatory and able to carry out work of a light or sedentary nature. For example, light housework, office work)  2 - Symptomatic, <50% in bed during the day (Ambulatory and capable of all self care but unable to carry out any work activities. Up and about more than 50% of waking hours)  3 - Symptomatic, >50% in bed, but not bedbound (Capable of only limited self-care, confined to bed or chair 50% or more of waking hours)  4 - Bedbound (Completely disabled. Cannot carry on any self-care. Totally confined to bed or chair)  5 - Death   Eustace Pen MM, Creech RH, Tormey DC, et al. [phone]). "Toxicity and response criteria of the [facility] Group". [place] Oncol. 5 (6): 649-55  LABORATORY DATA:  Lab Results  Component Value Date   WBC 5.7 06/17/2018   HGB 11.2 (L) 06/17/2018   HCT 35.8 (L) 06/17/2018   MCV 86.7 06/17/2018   PLT 293 06/17/2018   NEUTROABS 2.4 01/18/2018   Lab Results  Component Value Date   NA 136 06/17/2018   K 3.1 (L) 06/17/2018   CL 101 06/17/2018   CO2 22 06/17/2018   GLUCOSE 108 (H) 06/17/2018   CREATININE 0.65 06/17/2018   CALCIUM 9.5 06/17/2018      RADIOGRAPHY: Ct Angio  Abd/pel W/ And/or W/o  Result Date: 06/01/2018 CLINICAL DATA:  History of previous DVT, post venous intervention and stent placement, now with left groin pain. EXAM: CTV ABDOMEN AND PELVIS WITH CONTRAST TECHNIQUE: Multidetector CT imaging of the abdomen and pelvis was performed using the standard protocol during bolus administration of intravenous contrast. Multiplanar reconstructed images and MIPs were obtained and reviewed to evaluate the vascular anatomy. CONTRAST:  103m ISOVUE-370 IOPAMIDOL (ISOVUE-370) INJECTION 76% [place] [address] Results: Creatinine 0.6 mg/dL. COMPARISON:  None. FINDINGS: Examination is degraded secondary to patient respiratory artifact. VASCULAR Normal caliber of the abdominal aorta. The major branch vessels of the abdominal aorta appear widely patent given patient respiratory artifact. There is no significant atherosclerotic plaque within the abdominal aorta. The left common and external iliac arteries appear of normal caliber and patent though are circumferentially encased by the infiltrative left pelvic sidewall mass. Veins: The patient has undergone stenting of the left external iliac vein the patency of which is difficult to ascertain however the stent does appear patent at its peripheral aspect (image 80, series 2) and there is symmetric enhancement of the upstream left common iliac vein. The IVC  and right pelvic venous system appear widely patent. Note is made of several tiny superficial varicosities within the right lateral and anterior thigh (image 132, series 2). Review of the MIP images confirms the above findings. _________________________________________________________ NON-VASCULAR Lower chest: Limited visualization of the lower thorax is negative for focal airspace opacity or pleural effusion. Normal heart size.  No pericardial effusion. Hepatobiliary: Normal hepatic contour. No discrete hepatic lesions. Post  cholecystectomy. No intra extrahepatic bili duct dilatation. No ascites. Pancreas: Normal appearance of the pancreas. Spleen: Normal appearance of the spleen. Adrenals/Urinary Tract: There is symmetric enhancement and excretion of the bilateral kidneys. No definite renal stones on this postcontrast examination. No discrete renal lesions. No urine obstruction or perinephric stranding. There is mild thickening of the crux of the left adrenal gland without discrete nodule. Normal appearance of the right adrenal gland. Normal appearance of the urinary bladder given degree of distention. Stomach/Bowel: Rather extensive colonic diverticulosis without evidence of superimposed acute diverticulitis. Suspected small hiatal hernia. No evidence of enteric obstruction. Normal appearance of the terminal ileum and the retrocecal appendix. No pneumoperitoneum, pneumatosis or portal venous gas. Lymphatic: No bulky retroperitoneal, mesenteric, enlarged left inguinal lymph node measuring 5.9 x 2.4 cm (image 107, series 3; coronal image 46, series 7). Otherwise, there is no definitive bulky retroperitoneal or pelvic lymphadenopathy. Reproductive: Post hysterectomy. No discrete adnexal lesion. Small amount of free fluid is seen with the pelvic cul-de-sac. Other/Musculoskeletal: There and abnormal infiltrative mass thickening involving the left pelvic sidewall measuring approximately 8.7 x 4.6 x 9.5 cm (axial image 93, series 3; coronal image 76, series 7). This finding is associated with lucency involving the medial wall of the left acetabulum (images 98 and 99, series 3), with potential nondisplaced pathologic fracture (images 98, 99 and 100, series 3). No definitive associated periostitis. No definite left hip joint effusion. IMPRESSION: 1. 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 Additionally, there is lucency involving the  medial sidewall of the left acetabulum with potential nondisplaced pathologic fracture. Further evaluation with contrast-enhanced pelvic MRI could be performed as clinically indicated. 2. The left pelvic arterial and venous system is encased by this infiltrative left pelvic sidewall mass however while difficult to ascertain, the left external iliac venous stent appears patent. Critical Value/emergent results were called by telephone at the time of interpretation on 06/01/2018 at 12:58 pm to Dr. Carlis Abbott (on call vascular surgery), who verbally acknowledged these results. Electronically Signed   By: Sandi Mariscal M.D.   On: 06/01/2018 13:42      IMPRESSION: Ms. CYNAI SKEENS  is a 73 y.o. year old with recurrent grade 2 endometrioid endometrial adenocarcinoma. It is recurrent locally to the left sidewall and inguinal node causing significant pain for the patient.  She may have early nondisplaced pathologic fractures involving the left acetabulum due to tumor infiltration.  She has been scheduled for a PET scan to complete her staging work-up.  Given the patient's extensive mass along the left pelvis and her level of pain intensity I would initially recommend palliative high dose per fraction radiation therapy  to get better control of this issue. I Discussed the overall treatment course side effects and potential toxicities of palliative left sided pelvic radiation therapy with the patient.  She appears to understand and wishes to proceed with planned course of treatment.  PLAN: Patient will return tomorrow for CT simulation with treatments to begin early next week.  Anticipate ~  3 weeks of radiation therapy directed at the large left-sided pelvic mass and associated bony involvement.  Patient will then likely proceed with chemotherapy under the direction of Dr. Alvy Bimler with a PET scan scheduled to evaluate for extra pelvic disease.    ------------------------------------------------  Blair Promise, PhD, MD

## 2018-06-24 NOTE — Assessment & Plan Note (Signed)
She has poorly controlled pain secondary to malignancy I have discontinued Tylenol 3 and tramadol I recommend immediate release morphine sulfate to take as needed We discussed pain management, the importance of documentation of her pain medicine intake on a journal and we discussed narcotic refill policy I warned her about risk of sedation and constipation I will see her next week for further management

## 2018-06-24 NOTE — Assessment & Plan Note (Addendum)
I reviewed CT imaging from April with the patient in great detail With significant disease burden, I am concerned about diffuse metastatic disease Instead of ordering CT scan of the chest, abdomen, pelvis and bone scan, I recommend staging PET CT scan instead We will try to get insurance prior authorization before we proceed with treatment The PET CT scan would also help radiation oncologist to plan the radiation treatment I will see her back next week for further supportive care and pain management  I will get pathologist to order MSI testing to see if she will qualify for immunotherapy If not, I plan to recommend treatment with carboplatin and Taxol She will likely need concurrent radiation therapy for pain management and treatment to the lymph nodes

## 2018-06-24 NOTE — Progress Notes (Signed)
Called [name] and advised him of appointments to see Dr. Alvy Bimler on 06/30/18 and PET scan on 07/02/18 (NPO 6 hours before).  He verbalized understanding and agreement.

## 2018-06-24 NOTE — Progress Notes (Signed)
[facility] CONSULT NOTE  Patient Care Team: Nolene Ebbs, MD as PCP - General (Internal Medicine)  ASSESSMENT & PLAN:  Uterine cancer [facility]) I reviewed CT imaging from April with the patient in great detail With significant disease burden, I am concerned about diffuse metastatic disease Instead of ordering CT scan of the chest, abdomen, pelvis and bone scan, I recommend staging PET CT scan instead We will try to get insurance prior authorization before we proceed with treatment The PET CT scan would also help radiation oncologist to plan the radiation treatment I will see her back next week for further supportive care and pain management  I will get pathologist to order MSI testing to see if she will qualify for immunotherapy If not, I plan to recommend treatment with carboplatin and Taxol She will likely need concurrent radiation therapy for pain management and treatment to the lymph nodes   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 ([place]) There is evidence of bony erosion from her local disease in her pelvis I recommend further staging information  Metastasis to lymph nodes ([place]) She has diffuse lymphadenopathy encasing her IVC causing her significant unresolved DVT I explained the pathophysiology of leg swelling with the patient and recommend her to discontinue diuretic therapy   Other constipation She has severe constipation due to immobility, reduced oral intake and pain We discussed the importance of daily laxatives I also recommend her to document her daily bowel movement.  Lower leg DVT (deep venous thromboembolism), chronic, left (HCC) Her anticoagulation therapy is placed on hold in anticipation for port placement She can resume taking her anticoagulation therapy the day after port placement I will see her next week and remind her  Cancer associated pain She has poorly controlled pain secondary to malignancy I have discontinued Tylenol 3 and tramadol I recommend immediate  release morphine sulfate to take as needed We discussed pain management, the importance of documentation of her pain medicine intake on a journal and we discussed narcotic refill policy I warned her about risk of sedation and constipation I will see her next week for further management  Goals of care, counseling/discussion The patient has significant disease burden Her son is her healthcare power of attorney I recommend further imaging study to assess the stage of her disease The patient would like to be treated as aggressive as possible   Orders Placed This Encounter  Procedures  . IR IMAGING GUIDED PORT INSERTION    Standing Status:   Future    Standing Expiration Date:   08/24/2019    Order Specific Question:   Reason for Exam (SYMPTOM  OR DIAGNOSIS REQUIRED)    Answer:   need port for chemo to start 5/15    Order Specific Question:   Preferred Imaging Location?    Answer:   [facility] PET Image Initial (PI) Skull Base To Thigh    Standing Status:   Future    Standing Expiration Date:   06/24/2019    Order Specific Question:   If indicated for the ordered procedure, I authorize the administration of a radiopharmaceutical per Radiology protocol    Answer:   Yes    Order Specific Question:   Preferred imaging location?    Answer:   [facility]    Order Specific Question:   Radiology Contrast Protocol - do NOT remove file path    Answer:   [email or web address]  . CBC with Differential/Platelet    Standing Status:   Future  Standing Expiration Date:   07/29/2019  . Comprehensive metabolic panel    Standing Status:   Future    Standing Expiration Date:   07/29/2019  . CA 125    Standing Status:   Future    Standing Expiration Date:   07/29/2019     CHIEF COMPLAINTS/PURPOSE OF CONSULTATION:  Recurrent uterine cancer  HISTORY OF PRESENTING ILLNESS:  [name] 73 y.o. female is here because of recent diagnosis of recurrent  uterine cancer I have reviewed her records extensively The patient was diagnosed with uterine cancer in 2012 due to postmenopausal bleeding.  With early stage disease, she did not undergo further adjuvant treatment and she was lost to follow-up Around last year, she started to have leg swelling and was diagnosed with DVT Her leg swelling does not despite interventional placement of stent and anticoagulation therapy.  With subsequent imaging studies, she was noted to have pelvic mass.  Biopsy and final pathology confirmed recurrent uterine cancer She is here today by herself.  Her son, Coralyn Pear was available over the phone to collaborate her history  I have reviewed her chart and materials related to her cancer extensively and collaborated history with the patient. 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 Uterus +/- tubes/ovaries, neoplastic, with left fallopian tube and ovary - INVASIVE ENDOMETRIOID CARCINOMA (1.5 CM), FIGO GRADE II, ARISING IN A BACKGROUND OF ATYPICAL COMPLEX HYPERPLASIA, CONFINED WITHIN INNER HALF OF THE MYOMETRIUM. - ENDOMETRIAL POLYP WITH ASSOCIATED ATYPICAL COMPLEX HYPERPLASIA. - MYOMETRIUM: LEIOMYOMATA. - CERVIX: BENIGN SQUAMOUS MUCOSA AND ENDOCERVICAL MUCOSA, NO DYSPLASIA OR MALIGNANCY. - LEFT OVARY: BENIGN OVARIAN TISSUE WITH ENDOSALPINGOSIS, NO EVIDENCE OF ATYPIA OR MALIGNANCY. - LEFT FALLOPIAN TUBE: NO HISTOLOGIC ABNORMALITIES. - PLEASE SEE ONCOLOGY TEMPLATE FOR DETAIL. 2. Ovary and fallopian tube, right - BENIGN OVARIAN TISSUE WITH ENDOSALPINGOSIS, NO ATYPIA OR MALIGNANCY. - BENIGN FALLOPIAN TUBAL TISSUE, NO PATHOLOGIC ABNORMALITIES. Microscopic Comment 1. UTERUS Specimen: Uterus, cervix, bilateral ovaries and fallopian tubes Procedure: Total hysterectomy and bilateral salpingo-oophorectomy Lymph  node sampling performed: No Specimen integrity: Intact Maximum tumor size (cm): 1.5 cm, glass slide measurement Histologic type: Invasive endometrioid carcinoma Grade: FIGO grade II Myometrial invasion: 1 cm where myometrium is 2.3 cm in thickness Cervical stromal involvement: No Extent of involvement of other organs: No Lymph vascular invasion: Not identified Peritoneal washings: Negative ([id number]) Lymph nodes: number examined N/A; number positive N/A TNM code: pT1a, pNX 1 oFf 3IGO Stage (based on pathologic findings, needs clinical correlation): IA  Comments: Sections the endomyometrium away from the grossly identified endometrial polyp show an invasive FIGO grade II endometrioid carcinoma. The tumor is confined within inner half of the myometrium. No angiolymphatic invasion is identified. No cervical stromal involvement is identified. Sections of the grossly identified endometrial polyp show an endometrial polyp with associated atypical compacted hyperplasia with no definitive evidence of carcinoma.    12/07/2017 Imaging    US venous Doppler Right: No evidence of common femoral vein obstruction. Left: Findings consistent with acute deep vein thrombosis involving the left femoral vein, left proximal profunda vein, and left popliteal vein. Unable to adequately interrogate the common femoral and higher, or the calf secondary to significant edema and body habitus    12/07/2017 [facility] Admission    She presented to the ER and was diagnosed with acute  DVT    01/18/2018 - 01/21/2018 Hospital Admission    She was admitted to the hospital for management of severe persistent DVT    01/18/2018 Imaging    US venous Doppler Right: No evidence of common femoral vein obstruction. Left: Findings consistent with acute deep vein thrombosis involving the left common femoral vein, and left popliteal vein.    01/19/2018 Surgery    Pre-operative Diagnosis: Subacute DVT with severe post thrombotic  syndrome Post-operative diagnosis:  Same Surgeon:  Erlene Quan C. Donzetta Matters, MD Procedure Performed: 1.  Ultrasound-guided cannulation left small saphenous vein 2.  Left lower extremity and central venography 3.  Intravascular ultrasound of left popliteal, femoral, common femoral, external and common iliac veins and IVC 4.  Stent of left common and external iliac veins with 14 x 60 mm Vici 5.  Moderate sedation with fentanyl and Versed for 50 minutes  Indications: 73 year old female with a history of DVT in October now presents with persistent left lower extremity swelling and ultrasound demonstrating likely persistent DVT.  She has been on Xarelto at this time.  She is now indicated for venogram possible intervention.  Findings: Flow in the left lower extremity was stagnant throughout but by venogram all veins were patent.  There was a focal occlusive area approximately 2 cm in length at the common and external iliac vein junction at the hypogastric on the left.  After stenting and ballooning we had a diameter of 12 millimeters in the stent and venogram demonstrated flow in the lower extremity veins were previously was stagnant and no further residual stenosis in the left common and external iliac vein junction.    04/12/2018 Imaging    US Venous Doppler Right: No evidence of common femoral vein obstruction. Left: There is no evidence of deep vein thrombosis in the lower extremity. However, portions of this examination were limited- see technologist comments above. Left groin: Large hypoechoic area with mixed echoes noted measuring nearly 10 cm. Possible  hematoma versus unknown etiology. Ultrasound characteristics of enlarged lymph nodes noted in the groin.       05/15/2018 Imaging    US Venous Doppler Right: No evidence of deep vein thrombosis in the lower extremity. No indirect evidence of obstruction proximal to the inguinal ligament. Left: No reflux was noted in the common femoral vein ,  femoral vein in the thigh, popliteal vein, great saphenous vein at the saphenofemoral junction, great saphenous vein at the proximal thigh, great saphenous vein at the mid thigh, great saphenous vein  at the distal thigh, great saphenous vein at the knee, origin of the small saphenous vein, proximal small saphenous vein, and mid small saphenous vein. There is no evidence of deep vein thrombosis in the lower extremity. There is no evidence of superficial venous thrombosis. No cystic structure found in the popliteal fossa. Unable to evaluate extension of common femoral vein obstruction proximal to the inguinal ligament.    06/01/2018 Imaging    1. 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 Additionally, there is lucency involving the medial sidewall of the left acetabulum with potential nondisplaced pathologic fracture. Further evaluation with contrast-enhanced pelvic MRI could be performed as clinically indicated. 2. The left pelvic arterial and venous system is encased by this infiltrative left pelvic sidewall mass however while difficult to ascertain, the left external iliac venous stent appears patent.    06/18/2018 Pathology Results    Lymph node for lymphoma, Left Inguinal -  METASTATIC ADENOCARCINOMA, SEE COMMENT. Microscopic Comment Immunohistochemistry is positive for cytokeratin 7, PAX8, ER, and PR. Cytokeratin 5/6,and p63 are negative. The immunoprofile along with the patient's history are consistent with a gynecologic primary.    06/18/2018 Surgery    Pre-op Diagnosis: INGUINAL LYMPHADENOPATHY, PELVIC MASS     Procedure(s): EXCISIONAL BIOPSY DEEP LEFT INGUINAL LYMPH NODE  Surgeon(s): Coralie Keens, MD     06/24/2018 Cancer Staging    Staging form: Corpus Uteri - Carcinoma and Carcinosarcoma, AJCC 8th Edition - Clinical: Stage IVB (cT1a, cN2, pM1) - Signed by [name], MD on  06/24/2018    She complained of poor appetite and has lost some weight She has severe, uncontrolled pain in her left hip region in the groin radiating down to her leg She has some numbness around her knee She denies falls She has no vaginal bleeding She has some constipation.  Her last bowel movement was 3 days ago She is dependent on her son and daughter-in-law for help around the house.  She is able to perform most activities of daily living with additional assistance from her family.  She walks around with a walker for balance MEDICAL HISTORY:  Past Medical History:  Diagnosis Date  . Acute upper respiratory infection 07/06/2014  . Anemia   . Arthritis    Back   . Colon polyp    Tubular Adenoma   . Cough productive of clear sputum 06/22/2014  . GERD (gastroesophageal reflux disease)   . History of right bundle branch block (RBBB)   . HOH (hard of hearing)   . Hypertension    had in the past, is no longer on medication for this and blood pressures are WNL  . 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 Primary localized osteoarthritis of right knee   . Uterine cancer ([place]) 06/23/2018    SURGICAL HISTORY: Past Surgical History:  Procedure Laterality Date  . ABDOMINAL HYSTERECTOMY  2012  . CHOLECYSTECTOMY N/A 03/09/2013   Procedure: LAPAROSCOPIC CHOLECYSTECTOMY;  Surgeon: Gayland Curry, MD;  Location: [facility];  Service: General;  Laterality: N/A;  . COLONOSCOPY W/ BIOPSIES    . LARYNGOSCOPY Left 03/14/2017   Procedure: LARYNGOSCOPY;  Surgeon: Helayne Seminole, MD;  Location: [facility];  Service: ENT;  Laterality: Left;  . LOWER EXTREMITY VENOGRAPHY Left 01/19/2018   Procedure: LOWER EXTREMITY VENOGRAPHY;  Surgeon: Waynetta Sandy, MD;  Location: [facility];  Service: Cardiovascular;  Laterality: Left;  . LYMPH NODE BIOPSY Left 06/18/2018   Procedure: EXCISIONAL BIOPSY LEFT INGUINAL LYMPH NODE;  Surgeon: Coralie Keens, MD;  Location: [facility];  Service: General;  Laterality: Left;  .  PERIPHERAL VASCULAR INTERVENTION Left 01/19/2018   Procedure: PERIPHERAL VASCULAR INTERVENTION;  Surgeon: Waynetta Sandy, MD;  Location: [facility];  Service: Cardiovascular;  Laterality: Left;  LEFT ILIAC VENOUS  . TOTAL KNEE ARTHROPLASTY  04/29/2011   Procedure: TOTAL KNEE ARTHROPLASTY;  Surgeon: Lorn Junes, MD;  Location: [facility];  Service: Orthopedics;  Laterality: Left;  DR Tillman THIS CASE  . TOTAL KNEE ARTHROPLASTY Right 07/04/2014   Procedure: TOTAL KNEE ARTHROPLASTY;  Surgeon: Elsie Saas, MD;  Location: [place];  Service: Orthopedics;  Laterality: Right;    SOCIAL HISTORY: Social History   Socioeconomic History  . Marital status: Divorced    Spouse name: Not on file  . Number of children: 1  . Years of education: Not on file  . Highest education level: Not on file  Occupational History  . Occupation: Retired   Scientific laboratory technician  . Financial resource strain: Not on file  . Food insecurity:    Worry: Not on file    Inability: Not on file  . Transportation needs:    Medical: Not on file    Non-medical: Not on file  Tobacco Use  . Smoking status: Former Smoker    Years: 1.00    Last attempt to quit: 04/22/1988    Years since quitting: 30.1  . Smokeless tobacco: Never Used  Substance and Sexual Activity  . Alcohol use: No  . Drug use: No  . Sexual activity: Yes    Birth control/protection: Surgical  Lifestyle  . Physical activity:    Days per week: Not on file    Minutes per session: Not on file  . Stress: Not on file  Relationships  . Social connections:    Talks on phone: Not on file    Gets together: Not on file    Attends religious service: Not on file    Active member of club or organization: Not on file    Attends meetings of clubs or organizations: Not on file    Relationship status: Not on file  . Intimate partner violence:    Fear of current or ex partner: Not on file    Emotionally abused: Not on file    Physically  abused: Not on file    Forced sexual activity: Not on file  Other Topics Concern  . Not on file  Social History Narrative   Daily caffeine     FAMILY HISTORY: Family History  Problem Relation Age of Onset  . Arthritis Mother   . Hypertension Mother   . Alzheimer's disease Father   . Diabetes Sister   . Hypertension Sister   . Hypertension Brother   . Stroke Brother   . Hypertension Brother   . Hypertension Sister   . Hypertension Sister   . Hypertension Sister   . Breast cancer Other        Niece  . Anesthesia problems Neg Hx   . Hypotension Neg Hx   . Malignant hyperthermia Neg Hx   . Pseudochol deficiency Neg Hx   . Colon cancer Neg Hx     ALLERGIES:  has No Known Allergies.  MEDICATIONS:  Current Outpatient Medications  Medication Sig Dispense Refill  . clopidogrel (PLAVIX) 75 MG tablet Take 1 tablet (75 mg total) by mouth daily with breakfast. 30 tablet 11  . cycloSPORINE (RESTASIS) 0.05 % ophthalmic emulsion Place 1 drop into both eyes 2 (two) times daily.    Marland Kitchen morphine (MSIR) 15 MG tablet Take 1 tablet (15 mg total) by mouth every 4 (four) hours as needed for severe pain. 30 tablet 0  . Olopatadine HCl 0.2 % SOLN Place 1 drop into both eyes daily.    . polyethylene glycol (MIRALAX / GLYCOLAX) 17 g packet Take 17 g by mouth daily. 30 each 11  . rivaroxaban (XARELTO) 20 MG TABS tablet Take 20 mg by mouth daily with breakfast.     No current facility-administered medications for this visit.     REVIEW OF SYSTEMS:   Constitutional: Denies fevers, chills or abnormal night sweats Eyes: Denies blurriness of vision, double vision or watery eyes Ears, nose, mouth, throat, and face: Denies mucositis or sore throat Respiratory: Denies cough, dyspnea or wheezes Cardiovascular: Denies palpitation, chest discomfort or lower extremity swelling Skin: Denies abnormal skin rashes Lymphatics: Denies new lymphadenopathy or easy  bruising Behavioral/Psych: Mood is stable, no new  changes  All other systems were reviewed with the patient and are negative.  PHYSICAL EXAMINATION: ECOG PERFORMANCE STATUS: 2 - Symptomatic, <50% confined to bed  Vitals:   06/24/18 0816  BP: (!) 143/73  Pulse: 98  Resp: 18  Temp: 98.1 F (36.7 C)  SpO2: 100%   Filed Weights   06/24/18 0816  Weight: 225 lb 12.8 oz (102.4 kg)    GENERAL:alert, no distress and comfortable SKIN: skin color, texture, turgor are normal, no rashes or significant lesions EYES: normal, conjunctiva are pink and non-injected, sclera clear OROPHARYNX:no exudate, no erythema and lips, buccal mucosa, and tongue normal  NECK: supple, thyroid normal size, non-tender, without nodularity LYMPH:  no palpable lymphadenopathy in the cervical, axillary or inguinal LUNGS: clear to auscultation and percussion with normal breathing effort HEART: regular rate & rhythm and no murmurs with significant left lower extremity edema ABDOMEN:abdomen soft, non-tender and normal bowel sounds.  Limited exam due to abdominal obesity.  Well-healed surgical scars noted on the left inguinal region Musculoskeletal:no cyanosis of digits and no clubbing  PSYCH: alert & oriented x 3 with fluent speech NEURO: no focal motor/sensory deficits  LABORATORY DATA:  I have reviewed the data as listed Lab Results  Component Value Date   WBC 5.7 06/17/2018   HGB 11.2 (L) 06/17/2018   HCT 35.8 (L) 06/17/2018   MCV 86.7 06/17/2018   PLT 293 06/17/2018   Recent Labs    01/18/18 1902 01/19/18 0504 01/20/18 0352 06/17/18 1006  NA 134* 138 135 136  K 3.6 3.9 4.0 3.1*  CL 101 104 104 101  CO2 '25 28 22 22  ' GLUCOSE 110* 104* 100* 108*  BUN 11 12 7* 9  CREATININE 1.00 0.86 0.80 0.65  CALCIUM 8.9 9.1 8.8* 9.5  GFRNONAA 56* >60 >60 >60  GFRAA >60 >60 >60 >60  PROT 7.6  --   --   --   ALBUMIN 3.5  --   --   --   AST 19  --   --   --   ALT 13  --   --   --   ALKPHOS 74  --   --   --   BILITOT 0.3  --   --   --     RADIOGRAPHIC  STUDIES: I have reviewed imaging study with the patient I have personally reviewed the radiological images as listed and agreed with the findings in the report. Ct Angio Abd/pel W/ And/or W/o  Result Date: 06/01/2018 CLINICAL DATA:  History of previous DVT, post venous intervention and stent placement, now with left groin pain. EXAM: CTV ABDOMEN AND PELVIS WITH CONTRAST TECHNIQUE: Multidetector CT imaging of the abdomen and pelvis was performed using the standard protocol during bolus administration of intravenous contrast. Multiplanar reconstructed images and MIPs were obtained and reviewed to evaluate the vascular anatomy. CONTRAST:  30m ISOVUE-370 IOPAMIDOL (ISOVUE-370) INJECTION 76% Creatinine was obtained on site at [facility] [address] Results: Creatinine 0.6 mg/dL. COMPARISON:  None. FINDINGS: Examination is degraded secondary to patient respiratory artifact. VASCULAR Normal caliber of the abdominal aorta. The major branch vessels of the abdominal aorta appear widely patent given patient respiratory artifact. There is no significant atherosclerotic plaque within the abdominal aorta. The left common and external iliac arteries appear of normal caliber and patent though are circumferentially encased by the infiltrative left pelvic sidewall mass. Veins: The patient has undergone stenting of the left external  iliac vein the patency of which is difficult to ascertain however the stent does appear patent at its peripheral aspect (image 80, series 2) and there is symmetric enhancement of the upstream left common iliac vein. The IVC and right pelvic venous system appear widely patent. Note is made of several tiny superficial varicosities within the right lateral and anterior thigh (image 132, series 2). Review of the MIP images confirms the above findings. _________________________________________________________ NON-VASCULAR Lower chest: Limited visualization of the lower thorax is  negative for focal airspace opacity or pleural effusion. Normal heart size.  No pericardial effusion. Hepatobiliary: Normal hepatic contour. No discrete hepatic lesions. Post cholecystectomy. No intra extrahepatic bili duct dilatation. No ascites. Pancreas: Normal appearance of the pancreas. Spleen: Normal appearance of the spleen. Adrenals/Urinary Tract: There is symmetric enhancement and excretion of the bilateral kidneys. No definite renal stones on this postcontrast examination. No discrete renal lesions. No urine obstruction or perinephric stranding. There is mild thickening of the crux of the left adrenal gland without discrete nodule. Normal appearance of the right adrenal gland. Normal appearance of the urinary bladder given degree of distention. Stomach/Bowel: Rather extensive colonic diverticulosis without evidence of superimposed acute diverticulitis. Suspected small hiatal hernia. No evidence of enteric obstruction. Normal appearance of the terminal ileum and the retrocecal appendix. No pneumoperitoneum, pneumatosis or portal venous gas. Lymphatic: No bulky retroperitoneal, mesenteric, enlarged left inguinal lymph node measuring 5.9 x 2.4 cm (image 107, series 3; coronal image 46, series 7). Otherwise, there is no definitive bulky retroperitoneal or pelvic lymphadenopathy. Reproductive: Post hysterectomy. No discrete adnexal lesion. Small amount of free fluid is seen with the pelvic cul-de-sac. Other/Musculoskeletal: There and abnormal infiltrative mass thickening involving the left pelvic sidewall measuring approximately 8.7 x 4.6 x 9.5 cm (axial image 93, series 3; coronal image 76, series 7). This finding is associated with lucency involving the medial wall of the left acetabulum (images 98 and 99, series 3), with potential nondisplaced pathologic fracture (images 98, 99 and 100, series 3). No definitive associated periostitis. No definite left hip joint effusion. IMPRESSION: 1. Infiltrative mass  within the left pelvic sidewall measuring approximately 9.5 cm with associated pathologically enlarged left inguinal lymph node. Additionally, there is lucency involving the medial sidewall of the left acetabulum with potential nondisplaced pathologic fracture. Further evaluation with contrast-enhanced pelvic MRI could be performed as clinically indicated. 2. The left pelvic arterial and venous system is encased by this infiltrative left pelvic sidewall mass however while difficult to ascertain, the left external iliac venous stent appears patent. Critical Value/emergent results were called by telephone at the time of interpretation on 06/01/2018 at 12:58 pm to Dr. Carlis Abbott (on call vascular surgery), who verbally acknowledged these results. Electronically Signed   By: Sandi Mariscal M.D.   On: 06/01/2018 13:42    I spent 60 minutes counseling the patient face to face. The total time spent in the appointment was 80 minutes and more than 50% was on counseling.  All questions were answered. The patient knows to call the clinic with any problems, questions or concerns.  [name], MD 06/24/2018 10:07 AM

## 2018-06-24 NOTE — Assessment & Plan Note (Signed)
Her anticoagulation therapy is placed on hold in anticipation for port placement She can resume taking her anticoagulation therapy the day after port placement I will see her next week and remind her

## 2018-06-24 NOTE — Patient Instructions (Signed)
Coronavirus (COVID-19) Are you at risk?  Are you at risk for the Coronavirus (COVID-19)?  To be considered HIGH RISK for Coronavirus (COVID-19), you have to meet the following criteria:  . Traveled to [place], [place], [place], [place] or [place]; or in the [place] to [place], [place], [place], or [place]; and have fever, cough, and shortness of breath within the last 2 weeks of travel OR . Been in close contact with a person diagnosed with COVID-19 within the last 2 weeks and have fever, cough, and shortness of breath . IF YOU DO NOT MEET THESE CRITERIA, YOU ARE CONSIDERED LOW RISK FOR COVID-19.  What to do if you are HIGH RISK for COVID-19?  . If you are having a medical emergency, call 911. . Seek medical care right away. Before you go to a doctor's office, urgent care or emergency department, call ahead and tell them about your recent travel, contact with someone diagnosed with COVID-19, and your symptoms. You should receive instructions from your physician's office regarding next steps of care.  . When you arrive at healthcare provider, tell the healthcare staff immediately you have returned from visiting [place], [place], [place], [place] or [place]; or traveled in the [place] to [place], [place], [place], or [place]; in the last two weeks or you have been in close contact with a person diagnosed with COVID-19 in the last 2 weeks.   . Tell the health care staff about your symptoms: fever, cough and shortness of breath. . After you have been seen by a medical provider, you will be either: o Tested for (COVID-19) and discharged home on quarantine except to seek medical care if symptoms worsen, and asked to  - Stay home and avoid contact with others until you get your results (4-5 days)  - Avoid travel on public transportation if possible (such as bus, train, or airplane) or o Sent to the Emergency Department by EMS for evaluation, COVID-19 testing, and possible  admission depending on your condition and test results.  What to do if you are LOW RISK for COVID-19?  Reduce your risk of any infection by using the same precautions used for avoiding the common cold or flu:  . Wash your hands often with soap and warm water for at least 20 seconds.  If soap and water are not readily available, use an alcohol-based hand sanitizer with at least 60% alcohol.  . If coughing or sneezing, cover your mouth and nose by coughing or sneezing into the elbow areas of your shirt or coat, into a tissue or into your sleeve (not your hands). . Avoid shaking hands with others and consider head nods or verbal greetings only. . Avoid touching your eyes, nose, or mouth with unwashed hands.  . Avoid close contact with people who are sick. . Avoid places or events with large numbers of people in one location, like concerts or sporting events. . Carefully consider travel plans you have or are making. . If you are planning any travel outside or inside the [place], visit the CDC's Travelers' Health webpage for the latest health notices. . If you have some symptoms but not all symptoms, continue to monitor at home and seek medical attention if your symptoms worsen. . If you are having a medical emergency, call 911.   ADDITIONAL HEALTHCARE OPTIONS FOR PATIENTS  [facility] / e-Visit: [email or web address]         [facility]: [phone]  [facility]: [phone]                   [facility]: [phone]   

## 2018-06-24 NOTE — Assessment & Plan Note (Signed)
She has diffuse lymphadenopathy encasing her IVC causing her significant unresolved DVT I explained the pathophysiology of leg swelling with the patient and recommend her to discontinue diuretic therapy

## 2018-06-24 NOTE — Assessment & Plan Note (Signed)
There is evidence of bony erosion from her local disease in her pelvis I recommend further staging information

## 2018-06-24 NOTE — Progress Notes (Signed)
Consult Note: Gyn-Onc  Consult was requested by Dr. Ninfa Linden for the evaluation of [name] 73 y.o. female  CC:  Chief Complaint  Patient presents with  . Endometrial cancer [facility])    recurrent    Assessment/Plan:  [name]  is a 73 y.o.  year old with recurrent grade 2 endometrioid endometrial adenocarcinoma. It is recurrent locally to the left sidewall and inguinal node.  I reviewed her CT images from April, 2020 and her pathology reports and inpatient care notes. I reviewed her case personally with Dr's Alvy Bimler and Kinard to discuss treatment planning.   I am recommending and agree with the plan for PET to evaluate for extra-pelvic disease.  I am recommending radiation to the pelvis and groin to control the disease locally and palliate pain symptoms.  I am recommending consideration for carboplatin and paclitaxel chemotherapy until progression or complete response.  I agree with the plan for PDL1 testing to evaluate for possible use of immune therapy in the future.    HPI: [name] is a 73 year old woman who is seen in consultation at the request of Dr Ninfa Linden for apparent recurrent grade 2 endometrial cancer.   The patient's initial tumor history began in April 2012 when she was diagnosed with a FIGO grade 2 endometrioid endometrial adenocarcinoma and treated with a total vaginal hysterectomy and BSO with Dr. Marti Sleigh on Jul 03, 2010.  Final pathology from that surgery revealed an invasive 1.5 cm FIGO grade 2 endometrioid adenocarcinoma arising in a background of complex atypical hyperplasia.  It was confined to the inner half of the myometrium.  Lymphovascular space invasion was not identified.  The ovaries and cervix were benign.  Staging had not been performed.  There was 1 cm of 2.3 cm thickness invasion.  She was staged as FIGO stage Ib grade 2 endometrial adenocarcinoma and in accordance with NCCN guidelines was characterized as low risk for  recurrence and therefore no adjuvant therapy was prescribed.  In the fall 2019 she developed severe lower extremity (left) DVT.  This was treated with stenting of the left external iliac and common iliac vein.  She developed persistent symptoms and the development of an inguinal mass in February 2020.  Ultrasound at that time revealed a large hypoechoic area with mixed echoes measuring nearly 10 cm in the left groin consistent with an enlarged lymph node.  In April 2020 she underwent a CT scan of the abdomen pelvis which confirmed a 9.5 cm infiltrating mass within the left pelvic sidewall measuring approximately 9.5 cm with associated pathologically enlarged left inguinal nodes.  There was lucency involving the medial sidewall of the left acetabulum with potentially a nondisplaced pathologic fracture.  The left pelvic arterial venous system was encased by the infiltrative mass however the left external iliac venous stent appeared patent.  On June 18, 2018 Dr. Ninfa Linden took the patient to the operating room for an excisional biopsy of the left inguinal node.  Final pathology confirmed metastatic adenocarcinoma consistent with endometrial primary with immunostains positive for CK 7, PAX 8, ER and PR.  The patient reports sick significant pain with ambulation and movement of the left lower extremity.  She notes significant left lower extremity edema.  She lives alone.  She ambulates with a walking frame.  She is morbidly obese.  Her medical history is notable for her uterine cancer, arthritis of both knees requiring replacement, hypertension, or an anemia.  Her surgical history significant for prior vaginal  hysterectomy, cholecystectomy, and the stent placement and knee arthroplasties as previously noted.  Current Meds:  Outpatient Encounter Medications as of 06/24/2018  Medication Sig  . cycloSPORINE (RESTASIS) 0.05 % ophthalmic emulsion Place 1 drop into both eyes 2 (two) times daily.  . Olopatadine HCl  0.2 % SOLN Place 1 drop into both eyes daily.  . clopidogrel (PLAVIX) 75 MG tablet Take 1 tablet (75 mg total) by mouth daily with breakfast. (Patient not taking: Reported on 06/24/2018)  . morphine (MSIR) 15 MG tablet Take 1 tablet (15 mg total) by mouth every 4 (four) hours as needed for severe pain. (Patient not taking: Reported on 06/24/2018)  . polyethylene glycol (MIRALAX / GLYCOLAX) 17 g packet Take 17 g by mouth daily. (Patient not taking: Reported on 06/24/2018)  . rivaroxaban (XARELTO) 20 MG TABS tablet Take 20 mg by mouth daily with breakfast.   No facility-administered encounter medications on file as of 06/24/2018.     Allergy: No Known Allergies  Social Hx:   Social History   Socioeconomic History  . Marital status: Divorced    Spouse name: Not on file  . Number of children: 1  . Years of education: Not on file  . Highest education level: Not on file  Occupational History  . Occupation: Retired   Scientific laboratory technician  . Financial resource strain: Not on file  . Food insecurity:    Worry: Not on file    Inability: Not on file  . Transportation needs:    Medical: Not on file    Non-medical: Not on file  Tobacco Use  . Smoking status: Former Smoker    Years: 1.00    Last attempt to quit: 04/22/1988    Years since quitting: 30.1  . Smokeless tobacco: Never Used  Substance and Sexual Activity  . Alcohol use: No  . Drug use: No  . Sexual activity: Yes    Birth control/protection: Surgical  Lifestyle  . Physical activity:    Days per week: Not on file    Minutes per session: Not on file  . Stress: Not on file  Relationships  . Social connections:    Talks on phone: Not on file    Gets together: Not on file    Attends religious service: Not on file    Active member of club or organization: Not on file    Attends meetings of clubs or organizations: Not on file    Relationship status: Not on file  . Intimate partner violence:    Fear of current or ex partner: Not on file     Emotionally abused: Not on file    Physically abused: Not on file    Forced sexual activity: Not on file  Other Topics Concern  . Not on file  Social History Narrative   Daily caffeine     Past Surgical Hx:  Past Surgical History:  Procedure Laterality Date  . ABDOMINAL HYSTERECTOMY  2012  . CHOLECYSTECTOMY N/A 03/09/2013   Procedure: LAPAROSCOPIC CHOLECYSTECTOMY;  Surgeon: Gayland Curry, MD;  Location: [facility];  Service: General;  Laterality: N/A;  . COLONOSCOPY W/ BIOPSIES    . LARYNGOSCOPY Left 03/14/2017   Procedure: LARYNGOSCOPY;  Surgeon: Helayne Seminole, MD;  Location: [facility];  Service: ENT;  Laterality: Left;  . LOWER EXTREMITY VENOGRAPHY Left 01/19/2018   Procedure: LOWER EXTREMITY VENOGRAPHY;  Surgeon: Waynetta Sandy, MD;  Location: [facility];  Service: Cardiovascular;  Laterality: Left;  . LYMPH NODE BIOPSY  Left 06/18/2018   Procedure: EXCISIONAL BIOPSY LEFT INGUINAL LYMPH NODE;  Surgeon: Coralie Keens, MD;  Location: [facility];  Service: General;  Laterality: Left;  . PERIPHERAL VASCULAR INTERVENTION Left 01/19/2018   Procedure: PERIPHERAL VASCULAR INTERVENTION;  Surgeon: Waynetta Sandy, MD;  Location: [facility];  Service: Cardiovascular;  Laterality: Left;  LEFT ILIAC VENOUS  . TOTAL KNEE ARTHROPLASTY  04/29/2011   Procedure: TOTAL KNEE ARTHROPLASTY;  Surgeon: Lorn Junes, MD;  Location: [facility];  Service: Orthopedics;  Laterality: Left;  DR [place] THIS CASE  . TOTAL KNEE ARTHROPLASTY Right 07/04/2014   Procedure: TOTAL KNEE ARTHROPLASTY;  Surgeon: Elsie Saas, MD;  Location: [facility];  Service: Orthopedics;  Laterality: Right;    Past Medical Hx:  Past Medical History:  Diagnosis Date  . Acute upper respiratory infection 07/06/2014  . Anemia   . Arthritis    Back   . Colon polyp    Tubular Adenoma   . Cough productive of clear sputum 06/22/2014  . GERD (gastroesophageal reflux disease)   . History of right  bundle branch block (RBBB)   . HOH (hard of hearing)   . Hypertension    had in the past, is no longer on medication for this and blood pressures are WNL  . 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 Primary localized osteoarthritis of right knee   . Uterine cancer ([facility]) 06/23/2018    Past Gynecological History:  History of stage IB grade 2 endometrial adenocarcinoma treated with vaginal hysterectomy, BSO in May, 2012. No LMP recorded. Patient has had a hysterectomy.  Family Hx:  Family History  Problem Relation Age of Onset  . Arthritis Mother   . Hypertension Mother   . Alzheimer's disease Father   . Diabetes Sister   . Hypertension Sister   . Hypertension Brother   . Stroke Brother   . Hypertension Brother   . Hypertension Sister   . Hypertension Sister   . Hypertension Sister   . Breast cancer Other        Niece  . Anesthesia problems Neg Hx   . Hypotension Neg Hx   . Malignant hyperthermia Neg Hx   . Pseudochol deficiency Neg Hx   . Colon cancer Neg Hx     Review of Systems:  Constitutional  Feels well,    ENT Normal appearing ears and nares bilaterally Skin/Breast  No rash, sores, jaundice, itching, dryness Cardiovascular  No chest pain, shortness of breath, or edema  Pulmonary  No cough or wheeze.  Gastro Intestinal  No nausea, vomitting, or diarrhoea. No bright red blood per rectum, no abdominal pain, change in bowel movement, or constipation.  Genito Urinary  No frequency, urgency, dysuria, no bleeding Musculo Skeletal  No myalgia, arthralgia, joint swelling or pain  Neurologic  No weakness, numbness, change in gait,  Psychology  No depression, anxiety, insomnia.   Vitals:  Blood pressure (!) 158/86, pulse (!) 112, temperature 98.1 F (36.7 C), temperature source Oral, resp. rate 20, height '5\' 1"'  (1.549 m), weight 225 lb (102.1 kg), SpO2 100 %.  Physical Exam: WD in NAD Neck  Supple NROM, without any enlargements.  Lymph Node Survey Induration of left groin  underlying incision.  Cardiovascular  Pulse normal rate, regularity and rhythm. S1 and S2 normal.  Lungs  Clear to auscultation bilateraly, without wheezes/crackles/rhonchi. Good air movement.  Skin  No rash/lesions/breakdown  Psychiatry  Alert and oriented to person, place, and time  Abdomen  Normoactive bowel sounds, abdomen soft, non-tender and obese without evidence of hernia.  Back No CVA tenderness Genito Urinary  Vulva/vagina: Normal external female genitalia.   No lesions. No discharge or bleeding.  Bladder/urethra:  No lesions or masses, well supported bladder  Vagina: no visible lesions  Cervix and uterus surgically absent.  Adnexa: can appreciate fixed left pelvic sidewall mass - tender. Rectal  Good tone, no masses no cul de sac nodularity.  Extremities  Bilateral edema, left 3+ with peau d'orange changes to thigh.    Thereasa Solo, MD  06/24/2018, 3:57 PM

## 2018-06-25 ENCOUNTER — Ambulatory Visit
Admission: RE | Admit: 2018-06-25 | Discharge: 2018-06-25 | Disposition: A | Discharge status: Home / Self Care | Payer: Medicare Other | Source: Ambulatory Visit | Attending: Radiation Oncology | Admitting: Radiation Oncology

## 2018-06-25 ENCOUNTER — Other Ambulatory Visit: Payer: Self-pay | Admitting: Radiation Oncology

## 2018-06-25 ENCOUNTER — Other Ambulatory Visit: Payer: Self-pay

## 2018-06-25 ENCOUNTER — Encounter: Payer: Self-pay | Admitting: Radiation Oncology

## 2018-06-25 DIAGNOSIS — Z510 Encounter for antineoplastic radiation therapy: Secondary | ICD-10-CM | POA: Insufficient documentation

## 2018-06-25 DIAGNOSIS — C541 Malignant neoplasm of endometrium: Secondary | ICD-10-CM | POA: Insufficient documentation

## 2018-06-25 DIAGNOSIS — C7951 Secondary malignant neoplasm of bone: Secondary | ICD-10-CM

## 2018-06-25 MED ORDER — MORPHINE SULFATE ER 30 MG PO TBCR: 30.0000 mg | EXTENDED_RELEASE_TABLET | Freq: Two times a day (BID) | ORAL | 0 refills | Status: DC

## 2018-06-25 NOTE — Progress Notes (Signed)
   Radiation Oncology         [phone] ________________________________  Name: [name] MRN: [id number]  Date: 06/25/2018  DOB: [birth year]    DIAGNOSIS: Recurrent  grade 2 endometrioid endometrial adenocarcinoma   Narrative: Patient presented today for CT simulation in preparation for her palliative radiation therapy directed at the left pelvis.  Despite numerous attempts the patient was unable to lay on the CT simulation table for planning and set up.  Patient complained of intense pain in the left groin.  On exam patient has surgical scar in the left groin from her biopsy which is healed well without signs of infection.  Patient was started on short acting morphine yesterday but this does not appear to be adequate in terms of her pain level.  In light of this patient will be placed on MS Contin 30 mg every 12 hours in addition to her short acting pain medication.  Patient will pick this medication up this afternoon and take 1 approximately 7 PM tonight and another at 7 AM tomorrow morning.  She will then return at 3 PM friday and hopefully her pain control will be adequate so that she can proceed with planning.   -----------------------------------  Blair Promise, PhD, MD

## 2018-06-26 ENCOUNTER — Encounter: Payer: Self-pay | Admitting: Oncology

## 2018-06-26 ENCOUNTER — Other Ambulatory Visit: Payer: Self-pay

## 2018-06-26 ENCOUNTER — Other Ambulatory Visit: Payer: Self-pay | Admitting: Hematology and Oncology

## 2018-06-26 ENCOUNTER — Ambulatory Visit: Payer: Medicare Other | Admitting: Vascular Surgery

## 2018-06-26 ENCOUNTER — Other Ambulatory Visit: Payer: Self-pay | Admitting: Student

## 2018-06-26 ENCOUNTER — Ambulatory Visit
Admission: RE | Admit: 2018-06-26 | Discharge: 2018-06-26 | Disposition: A | Discharge status: Home / Self Care | Payer: Medicare Other | Source: Ambulatory Visit | Attending: Radiation Oncology | Admitting: Radiation Oncology

## 2018-06-26 DIAGNOSIS — Z510 Encounter for antineoplastic radiation therapy: Secondary | ICD-10-CM | POA: Diagnosis present

## 2018-06-26 DIAGNOSIS — C541 Malignant neoplasm of endometrium: Secondary | ICD-10-CM | POA: Diagnosis not present

## 2018-06-26 DIAGNOSIS — C55 Malignant neoplasm of uterus, part unspecified: Secondary | ICD-10-CM

## 2018-06-26 NOTE — Progress Notes (Signed)
Referral for genetics per Dr. Alvy Bimler. Apt scheduled for 07/08/18.  Called patient's son Coralyn Pear and advised him of appointment.  He verbalized agreement.

## 2018-06-29 ENCOUNTER — Ambulatory Visit (HOSPITAL_COMMUNITY)
Admission: RE | Admit: 2018-06-29 | Discharge: 2018-06-29 | Disposition: A | Discharge status: Home / Self Care | Payer: Medicare Other | Source: Ambulatory Visit | Attending: Hematology and Oncology | Admitting: Hematology and Oncology

## 2018-06-29 ENCOUNTER — Other Ambulatory Visit: Payer: Self-pay | Admitting: Hematology and Oncology

## 2018-06-29 ENCOUNTER — Encounter (HOSPITAL_COMMUNITY): Payer: Self-pay

## 2018-06-29 ENCOUNTER — Other Ambulatory Visit: Payer: Self-pay

## 2018-06-29 DIAGNOSIS — Z7902 Long term (current) use of antithrombotics/antiplatelets: Secondary | ICD-10-CM | POA: Diagnosis not present

## 2018-06-29 DIAGNOSIS — K219 Gastro-esophageal reflux disease without esophagitis: Secondary | ICD-10-CM | POA: Insufficient documentation

## 2018-06-29 DIAGNOSIS — Z79899 Other long term (current) drug therapy: Secondary | ICD-10-CM | POA: Insufficient documentation

## 2018-06-29 DIAGNOSIS — Z7901 Long term (current) use of anticoagulants: Secondary | ICD-10-CM | POA: Insufficient documentation

## 2018-06-29 DIAGNOSIS — C7951 Secondary malignant neoplasm of bone: Secondary | ICD-10-CM

## 2018-06-29 DIAGNOSIS — I10 Essential (primary) hypertension: Secondary | ICD-10-CM | POA: Diagnosis not present

## 2018-06-29 DIAGNOSIS — C774 Secondary and unspecified malignant neoplasm of inguinal and lower limb lymph nodes: Secondary | ICD-10-CM

## 2018-06-29 DIAGNOSIS — C541 Malignant neoplasm of endometrium: Secondary | ICD-10-CM | POA: Insufficient documentation

## 2018-06-29 DIAGNOSIS — M1711 Unilateral primary osteoarthritis, right knee: Secondary | ICD-10-CM | POA: Diagnosis not present

## 2018-06-29 DIAGNOSIS — Z87891 Personal history of nicotine dependence: Secondary | ICD-10-CM | POA: Insufficient documentation

## 2018-06-29 DIAGNOSIS — Z90710 Acquired absence of both cervix and uterus: Secondary | ICD-10-CM | POA: Diagnosis not present

## 2018-06-29 DIAGNOSIS — H9190 Unspecified hearing loss, unspecified ear: Secondary | ICD-10-CM | POA: Diagnosis not present

## 2018-06-29 DIAGNOSIS — Z8249 Family history of ischemic heart disease and other diseases of the circulatory system: Secondary | ICD-10-CM | POA: Insufficient documentation

## 2018-06-29 DIAGNOSIS — C55 Malignant neoplasm of uterus, part unspecified: Secondary | ICD-10-CM

## 2018-06-29 DIAGNOSIS — Z7189 Other specified counseling: Secondary | ICD-10-CM

## 2018-06-29 DIAGNOSIS — Z823 Family history of stroke: Secondary | ICD-10-CM | POA: Diagnosis not present

## 2018-06-29 HISTORY — PX: IR IMAGING GUIDED PORT INSERTION: IMG5740

## 2018-06-29 LAB — COMPREHENSIVE METABOLIC PANEL
ALT: 17 U/L (ref 0–44)
AST: 22 U/L (ref 15–41)
Albumin: 3.6 g/dL (ref 3.5–5.0)
Alkaline Phosphatase: 89 U/L (ref 38–126)
Anion gap: 11 (ref 5–15)
BUN: 11 mg/dL (ref 8–23)
CO2: 24 mmol/L (ref 22–32)
Calcium: 9.3 mg/dL (ref 8.9–10.3)
Chloride: 102 mmol/L (ref 98–111)
Creatinine, Ser: 0.61 mg/dL (ref 0.44–1.00)
GFR calc Af Amer: >60 mL/min (ref >60)
GFR calc non Af Amer: >60 mL/min (ref >60)
Glucose, Bld: 114 mg/dL — ABNORMAL HIGH (ref 70–99)
Potassium: 3.5 mmol/L (ref 3.5–5.1)
Sodium: 137 mmol/L (ref 135–145)
Total Bilirubin: 0.7 mg/dL (ref 0.3–1.2)
Total Protein: 8.5 g/dL — ABNORMAL HIGH (ref 6.5–8.1)

## 2018-06-29 LAB — CBC
HCT: 33.5 % — ABNORMAL LOW (ref 36.0–46.0)
Hemoglobin: 10.4 g/dL — ABNORMAL LOW (ref 12.0–15.0)
MCH: 27.2 pg (ref 26.0–34.0)
MCHC: 31 g/dL (ref 30.0–36.0)
MCV: 87.5 fL (ref 80.0–100.0)
Platelets: 311 10*3/uL (ref 150–400)
RBC: 3.83 MIL/uL — ABNORMAL LOW (ref 3.87–5.11)
RDW: 14 % (ref 11.5–15.5)
WBC: 6 10*3/uL (ref 4.0–10.5)
nRBC: 0 % (ref 0.0–0.2)

## 2018-06-29 LAB — PROTIME-INR
INR: 1.3 — ABNORMAL HIGH (ref 0.8–1.2)
Prothrombin Time: 16.4 seconds — ABNORMAL HIGH (ref 11.4–15.2)

## 2018-06-29 LAB — APTT: aPTT: 33 seconds (ref 24–36)

## 2018-06-29 MED ORDER — HEPARIN SOD (PORK) LOCK FLUSH 100 UNIT/ML IV SOLN
INTRAVENOUS | Status: AC
  Filled 2018-06-29: qty 5

## 2018-06-29 MED ORDER — LIDOCAINE-EPINEPHRINE 1 %-1:100000 IJ SOLN
INTRAMUSCULAR | Status: AC
  Filled 2018-06-29: qty 1

## 2018-06-29 MED ORDER — FENTANYL CITRATE (PF) 100 MCG/2ML IJ SOLN
INTRAMUSCULAR | Status: AC
  Filled 2018-06-29: qty 2

## 2018-06-29 MED ORDER — MIDAZOLAM HCL 2 MG/2ML IJ SOLN
INTRAMUSCULAR | Status: AC | PRN
  Administered 2018-06-29 (×4): 1 mg via INTRAVENOUS

## 2018-06-29 MED ORDER — SODIUM CHLORIDE 0.9 % IV SOLN
INTRAVENOUS | Status: DC
  Administered 2018-06-29: 14:00:00 via INTRAVENOUS

## 2018-06-29 MED ORDER — LIDOCAINE-EPINEPHRINE 2 %-1:100000 IJ SOLN
INTRAMUSCULAR | Status: AC | PRN
  Administered 2018-06-29: 10 mL

## 2018-06-29 MED ORDER — LIDOCAINE HCL (PF) 1 % IJ SOLN
INTRAMUSCULAR | Status: AC | PRN
  Administered 2018-06-29: 10 mL

## 2018-06-29 MED ORDER — LIDOCAINE HCL 1 % IJ SOLN
INTRAMUSCULAR | Status: AC
  Filled 2018-06-29: qty 20

## 2018-06-29 MED ORDER — MIDAZOLAM HCL 2 MG/2ML IJ SOLN
INTRAMUSCULAR | Status: AC
  Filled 2018-06-29: qty 4

## 2018-06-29 MED ORDER — CEFAZOLIN SODIUM-DEXTROSE 2-4 GM/100ML-% IV SOLN
INTRAVENOUS | Status: AC
  Administered 2018-06-29: 15:00:00 2 g via INTRAVENOUS
  Filled 2018-06-29: qty 100

## 2018-06-29 MED ORDER — HEPARIN SOD (PORK) LOCK FLUSH 100 UNIT/ML IV SOLN
INTRAVENOUS | Status: AC | PRN
  Administered 2018-06-29: 500 [IU]

## 2018-06-29 MED ORDER — CEFAZOLIN SODIUM-DEXTROSE 2-4 GM/100ML-% IV SOLN
2.0000 g | INTRAVENOUS | Status: AC
  Administered 2018-06-29: 15:00:00 2 g via INTRAVENOUS

## 2018-06-29 MED ORDER — FENTANYL CITRATE (PF) 100 MCG/2ML IJ SOLN
INTRAMUSCULAR | Status: AC | PRN
  Administered 2018-06-29 (×4): 50 ug via INTRAVENOUS

## 2018-06-29 NOTE — Consult Note (Signed)
Chief Complaint: Patient was seen in consultation today for port a cath placement  Referring Physician(s): Gorsuch,Ni  Supervising Physician: Markus Daft  Patient Status: [facility] - Out-pt  History of Present Illness: [name] is a 73 y.o. female with history of recurrent uterine cancer, initially diagnosed in 2012, who presents today for Port-A-Cath placement for chemotherapy.  Past Medical History:  Diagnosis Date  . Acute upper respiratory infection 07/06/2014  . Anemia   . Arthritis    Back   . Colon polyp    Tubular Adenoma   . Cough productive of clear sputum 06/22/2014  . GERD (gastroesophageal reflux disease)   . History of right bundle branch block (RBBB)   . HOH (hard of hearing)   . Hypertension    had in the past, is no longer on medication for this and blood pressures are WNL  . 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 Primary localized osteoarthritis of right knee   . Uterine cancer ([place]) 06/23/2018    Past Surgical History:  Procedure Laterality Date  . ABDOMINAL HYSTERECTOMY  2012  . CHOLECYSTECTOMY N/A 03/09/2013   Procedure: LAPAROSCOPIC CHOLECYSTECTOMY;  Surgeon: Gayland Curry, MD;  Location: [place];  Service: General;  Laterality: N/A;  . COLONOSCOPY W/ BIOPSIES    . LARYNGOSCOPY Left 03/14/2017   Procedure: LARYNGOSCOPY;  Surgeon: Helayne Seminole, MD;  Location: [place];  Service: ENT;  Laterality: Left;  . LOWER EXTREMITY VENOGRAPHY Left 01/19/2018   Procedure: LOWER EXTREMITY VENOGRAPHY;  Surgeon: Waynetta Sandy, MD;  Location: [place] [facility];  Service: Cardiovascular;  Laterality: Left;  . LYMPH NODE BIOPSY Left 06/18/2018   Procedure: EXCISIONAL BIOPSY LEFT INGUINAL LYMPH NODE;  Surgeon: Coralie Keens, MD;  Location: [place];  Service: General;  Laterality: Left;  . PERIPHERAL VASCULAR INTERVENTION Left 01/19/2018   Procedure: PERIPHERAL VASCULAR INTERVENTION;  Surgeon: Waynetta Sandy, MD;  Location: [place] [facility];  Service:  Cardiovascular;  Laterality: Left;  LEFT ILIAC VENOUS  . TOTAL KNEE ARTHROPLASTY  04/29/2011   Procedure: TOTAL KNEE ARTHROPLASTY;  Surgeon: Lorn Junes, MD;  Location: [place];  Service: Orthopedics;  Laterality: Left;  [place] THIS CASE  . TOTAL KNEE ARTHROPLASTY Right 07/04/2014   Procedure: TOTAL KNEE ARTHROPLASTY;  Surgeon: Elsie Saas, MD;  Location: [place];  Service: Orthopedics;  Laterality: Right;    Allergies: Patient has no known allergies.  Medications: Prior to Admission medications   Medication Sig Start Date End Date Taking? Authorizing Provider  cycloSPORINE (RESTASIS) 0.05 % ophthalmic emulsion Place 1 drop into both eyes 2 (two) times daily.   Yes [provider]  morphine (MS CONTIN) 30 MG 12 hr tablet Take 1 tablet (30 mg total) by mouth every 12 (twelve) hours. 06/25/18  Yes Gery Pray, MD  morphine (MSIR) 15 MG tablet Take 1 tablet (15 mg total) by mouth every 4 (four) hours as needed for severe pain. 06/24/18  Yes Gorsuch, Ni, MD  Olopatadine HCl 0.2 % SOLN Place 1 drop into both eyes daily. 12/06/17  Yes [provider]  polyethylene glycol (MIRALAX / GLYCOLAX) 17 g packet Take 17 g by mouth daily. 06/24/18  Yes [name], MD  clopidogrel (PLAVIX) 75 MG tablet Take 1 tablet (75 mg total) by mouth daily with breakfast. 01/21/18   Ulyses Amor, PA-C  rivaroxaban (XARELTO) 20 MG TABS tablet Take 20 mg by mouth daily with breakfast.    [provider]  Family History  Problem Relation Age of Onset  . Arthritis Mother   . Hypertension Mother   . Alzheimer's disease Father   . Diabetes Sister   . Hypertension Sister   . Hypertension Brother   . Stroke Brother   . Hypertension Brother   . Hypertension Sister   . Hypertension Sister   . Hypertension Sister   . Breast cancer Other        Niece  . Anesthesia problems Neg Hx   . Hypotension Neg Hx   . Malignant hyperthermia Neg Hx   . Pseudochol deficiency Neg  Hx   . Colon cancer Neg Hx     Social History   Socioeconomic History  . Marital status: Divorced    Spouse name: Not on file  . Number of children: 1  . Years of education: Not on file  . Highest education level: Not on file  Occupational History  . Occupation: Retired   Scientific laboratory technician  . Financial resource strain: Not on file  . Food insecurity:    Worry: Not on file    Inability: Not on file  . Transportation needs:    Medical: Not on file    Non-medical: Not on file  Tobacco Use  . Smoking status: Former Smoker    Years: 1.00    Last attempt to quit: 04/22/1988    Years since quitting: 30.2  . Smokeless tobacco: Never Used  Substance and Sexual Activity  . Alcohol use: No  . Drug use: No  . Sexual activity: Yes    Birth control/protection: Surgical  Lifestyle  . Physical activity:    Days per week: Not on file    Minutes per session: Not on file  . Stress: Not on file  Relationships  . Social connections:    Talks on phone: Not on file    Gets together: Not on file    Attends religious service: Not on file    Active member of club or organization: Not on file    Attends meetings of clubs or organizations: Not on file    Relationship status: Not on file  Other Topics Concern  . Not on file  Social History Narrative   Daily caffeine      Review of Systems currently denies fever, headache, chest pain, dyspnea, cough, abdominal/back pain, nausea, vomiting or bleeding.  Does have some left leg discomfort.  Vital Signs: BP 137/66   Pulse (!) 102   Temp 98.2 F (36.8 C) (Oral)   Resp 18   SpO2 100%   Physical Exam awake, alert.  Chest clear to auscultation bilaterally.  Heart with slightly tachycardic but regular rhythm.  Abdomen soft, positive bowel sounds, currently nontender.  Bilateral lower extremity edema noted.  Imaging: Ct Angio Abd/pel W/ And/or W/o  Result Date: 06/01/2018 CLINICAL DATA:  History of previous DVT, post venous intervention and  stent placement, now with left groin pain. EXAM: CTV ABDOMEN AND PELVIS WITH CONTRAST TECHNIQUE: Multidetector CT imaging of the abdomen and pelvis was performed using the standard protocol during bolus administration of intravenous contrast. Multiplanar reconstructed images and MIPs were obtained and reviewed to evaluate the vascular anatomy. CONTRAST:  15mL ISOVUE-370 IOPAMIDOL (ISOVUE-370) INJECTION 76% Creatinine was obtained on site at [facility] at [address] Results: Creatinine 0.6 mg/dL. COMPARISON:  None. FINDINGS: Examination is degraded secondary to patient respiratory artifact. VASCULAR Normal caliber of the abdominal aorta. The major branch vessels of the abdominal aorta appear widely patent  given patient respiratory artifact. There is no significant atherosclerotic plaque within the abdominal aorta. The left common and external iliac arteries appear of normal caliber and patent though are circumferentially encased by the infiltrative left pelvic sidewall mass. Veins: The patient has undergone stenting of the left external iliac vein the patency of which is difficult to ascertain however the stent does appear patent at its peripheral aspect (image 80, series 2) and there is symmetric enhancement of the upstream left common iliac vein. The IVC and right pelvic venous system appear widely patent. Note is made of several tiny superficial varicosities within the right lateral and anterior thigh (image 132, series 2). Review of the MIP images confirms the above findings. _________________________________________________________ NON-VASCULAR Lower chest: Limited visualization of the lower thorax is negative for focal airspace opacity or pleural effusion. Normal heart size.  No pericardial effusion. Hepatobiliary: Normal hepatic contour. No discrete hepatic lesions. Post cholecystectomy. No intra extrahepatic bili duct dilatation. No ascites. Pancreas: Normal appearance of the pancreas.  Spleen: Normal appearance of the spleen. Adrenals/Urinary Tract: There is symmetric enhancement and excretion of the bilateral kidneys. No definite renal stones on this postcontrast examination. No discrete renal lesions. No urine obstruction or perinephric stranding. There is mild thickening of the crux of the left adrenal gland without discrete nodule. Normal appearance of the right adrenal gland. Normal appearance of the urinary bladder given degree of distention. Stomach/Bowel: Rather extensive colonic diverticulosis without evidence of superimposed acute diverticulitis. Suspected small hiatal hernia. No evidence of enteric obstruction. Normal appearance of the terminal ileum and the retrocecal appendix. No pneumoperitoneum, pneumatosis or portal venous gas. Lymphatic: No bulky retroperitoneal, mesenteric, enlarged left inguinal lymph node measuring 5.9 x 2.4 cm (image 107, series 3; coronal image 46, series 7). Otherwise, there is no definitive bulky retroperitoneal or pelvic lymphadenopathy. Reproductive: Post hysterectomy. No discrete adnexal lesion. Small amount of free fluid is seen with the pelvic cul-de-sac. Other/Musculoskeletal: There and abnormal infiltrative mass thickening involving the left pelvic sidewall measuring approximately 8.7 x 4.6 x 9.5 cm (axial image 93, series 3; coronal image 76, series 7). This finding is associated with lucency involving the medial wall of the left acetabulum (images 98 and 99, series 3), with potential nondisplaced pathologic fracture (images 98, 99 and 100, series 3). No definitive associated periostitis. No definite left hip joint effusion. IMPRESSION: 1. 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 Additionally, there is lucency involving the medial sidewall of the left acetabulum with potential nondisplaced pathologic fracture. Further evaluation with  contrast-enhanced pelvic MRI could be performed as clinically indicated. 2. The left pelvic arterial and venous system is encased by this infiltrative left pelvic sidewall mass however while difficult to ascertain, the left external iliac venous stent appears patent. Critical Value/emergent results were called by telephone at the time of interpretation on 06/01/2018 at 12:58 pm to Dr. Carlis Abbott (on call vascular surgery), who verbally acknowledged these results. Electronically Signed   By: Sandi Mariscal M.D.   On: 06/01/2018 13:42    Labs:  CBC: Recent Labs    01/19/18 0504 01/20/18 0352 06/17/18 1006 06/29/18 1325  WBC 4.2 4.9 5.7 6.0  HGB 11.1* 10.6* 11.2* 10.4*  HCT 36.2 34.4* 35.8* 33.5*  PLT 269 260 293 311    COAGS: Recent Labs    01/19/18 0504 01/20/18 0017 01/20/18 0352 06/17/18 1006 06/18/18 0623  INR  --   --   --  1.4*  1.4*  APTT 44* 69* 49*  --   --     BMP: Recent Labs    01/18/18 1902 01/19/18 0504 01/20/18 0352 06/17/18 1006  NA 134* 138 135 136  K 3.6 3.9 4.0 3.1*  CL 101 104 104 101  CO2 25 28 22 22   GLUCOSE 110* 104* 100* 108*  BUN 11 12 7* 9  CALCIUM 8.9 9.1 8.8* 9.5  CREATININE 1.00 0.86 0.80 0.65  GFRNONAA 56* >60 >60 >60  GFRAA >60 >60 >60 >60    LIVER FUNCTION TESTS: Recent Labs    01/18/18 1902  BILITOT 0.3  AST 19  ALT 13  ALKPHOS 74  PROT 7.6  ALBUMIN 3.5    TUMOR MARKERS: No results for input(s): AFPTM, CEA, CA199, CHROMGRNA in the last 8760 hours.  Assessment and Plan: 73 y.o. female with history of recurrent uterine cancer, initially diagnosed in 2012, who presents today for Port-A-Cath placement for chemotherapy.Risks and benefits of image guided port-a-catheter placement was discussed with the patient including, but not limited to bleeding, infection, pneumothorax, or fibrin sheath development and need for additional procedures.  All of the patient's questions were answered, patient is agreeable to proceed. Consent signed and  in chart.     Thank you for this interesting consult.  I greatly enjoyed meeting [name] and look forward to participating in their care.  A copy of this report was sent to the requesting provider on this date.  Electronically Signed: D. Rowe Robert, PA-C 06/29/2018, 1:55 PM   I spent a total of 25 minutes    in face to face in clinical consultation, greater than 50% of which was counseling/coordinating care for port a cath placement

## 2018-06-29 NOTE — Progress Notes (Signed)
START ON PATHWAY REGIMEN - Uterine     A cycle is every 21 days:     Paclitaxel      Carboplatin   **Always confirm dose/schedule in your pharmacy ordering system**  Patient Characteristics: Endometrioid Histology, Recurrent/Progressive Disease, Second Line, Systemic Recurrence, No Previous Chemotherapy Histology: Endometrioid Histology Therapeutic Status: Recurrent or Progressive Disease AJCC T Category: T1 AJCC N Category: N2 AJCC M Category: M1 AJCC 8 Stage Grouping: IVB Line of Therapy: Second Line Time to Recurrence: No Previous Chemotherapy Intent of Therapy: Non-Curative / Palliative Intent, Discussed with Patient

## 2018-06-29 NOTE — Discharge Instructions (Signed)
Moderate Conscious Sedation, Adult, Care After °These instructions provide you with information about caring for yourself after your procedure. Your health care provider may also give you more specific instructions. Your treatment has been planned according to current medical practices, but problems sometimes occur. Call your health care provider if you have any problems or questions after your procedure. °What can I expect after the procedure? °After your procedure, it is common: °· To feel sleepy for several hours. °· To feel clumsy and have poor balance for several hours. °· To have poor judgment for several hours. °· To vomit if you eat too soon. °Follow these instructions at home: °For at least 24 hours after the procedure: ° °· Do not: °? Participate in activities where you could fall or become injured. °? Drive. °? Use heavy machinery. °? Drink alcohol. °? Take sleeping pills or medicines that cause drowsiness. °? Make important decisions or sign legal documents. °? Take care of children on your own. °· Rest. °Eating and drinking °· Follow the diet recommended by your health care provider. °· If you vomit: °? Drink water, juice, or soup when you can drink without vomiting. °? Make sure you have little or no nausea before eating solid foods. °General instructions °· Have a responsible adult stay with you until you are awake and alert. °· Take over-the-counter and prescription medicines only as told by your health care provider. °· If you smoke, do not smoke without supervision. °· Keep all follow-up visits as told by your health care provider. This is important. °Contact a health care provider if: °· You keep feeling nauseous or you keep vomiting. °· You feel light-headed. °· You develop a rash. °· You have a fever. °Get help right away if: °· You have trouble breathing. °This information is not intended to replace advice given to you by your health care provider. Make sure you discuss any questions you have  with your health care provider. °Document Released: 11/25/2012 Document Revised: 07/10/2015 Document Reviewed: 05/27/2015 °Elsevier Interactive Patient Education © 2019 [employer] ° ° °You may shower and remove your dressing tomorrow.  DO NOT use EMLA cream for 2 weeks after port placement as this cream will remove surgical glue on your incision. ° ° °Implanted Port Insertion, Care After °This sheet gives you information about how to care for yourself after your procedure. Your health care provider may also give you more specific instructions. If you have problems or questions, contact your health care provider. °What can I expect after the procedure? °After the procedure, it is common to have: °· Discomfort at the port insertion site. °· Bruising on the skin over the port. This should improve over 3-4 days. °Follow these instructions at home: °Port care °· After your port is placed, you will get a manufacturer's information card. The card has information about your port. Keep this card with you at all times. °· Take care of the port as told by your health care provider. Ask your health care provider if you or a family member can get training for taking care of the port at home. A home health care nurse may also take care of the port. °· Make sure to remember what type of port you have. °Incision care ° °  ° °· Follow instructions from your health care provider about how to take care of your port insertion site. Make sure you: °? Wash your hands with soap and water before and after you change your bandage (dressing). If   soap and water are not available, use hand sanitizer. °? Change your dressing as told by your health care provider. °? Leave stitches (sutures), skin glue, or adhesive strips in place. These skin closures may need to stay in place for 2 weeks or longer. If adhesive strip edges start to loosen and curl up, you may trim the loose edges. Do not remove adhesive strips completely unless your health  care provider tells you to do that. °· Check your port insertion site every day for signs of infection. Check for: °? Redness, swelling, or pain. °? Fluid or blood. °? Warmth. °? Pus or a bad smell. °Activity °· Return to your normal activities as told by your health care provider. Ask your health care provider what activities are safe for you. °· Do not lift anything that is heavier than 10 lb (4.5 kg), or the limit that you are told, until your health care provider says that it is safe. °General instructions °· Take over-the-counter and prescription medicines only as told by your health care provider. °· Do not take baths, swim, or use a hot tub until your health care provider approves. Ask your health care provider if you may take showers. You may only be allowed to take sponge baths. °· Do not drive for 24 hours if you were given a sedative during your procedure. °· Wear a medical alert bracelet in case of an emergency. This will tell any health care providers that you have a port. °· Keep all follow-up visits as told by your health care provider. This is important. °Contact a health care provider if: °· You cannot flush your port with saline as directed, or you cannot draw blood from the port. °· You have a fever or chills. °· You have redness, swelling, or pain around your port insertion site. °· You have fluid or blood coming from your port insertion site. °· Your port insertion site feels warm to the touch. °· You have pus or a bad smell coming from the port insertion site. °Get help right away if: °· You have chest pain or shortness of breath. °· You have bleeding from your port that you cannot control. °Summary °· Take care of the port as told by your health care provider. Keep the manufacturer's information card with you at all times. °· Change your dressing as told by your health care provider. °· Contact a health care provider if you have a fever or chills or if you have redness, swelling, or pain  around your port insertion site. °· Keep all follow-up visits as told by your health care provider. °This information is not intended to replace advice given to you by your health care provider. Make sure you discuss any questions you have with your health care provider. °Document Released: 11/25/2012 Document Revised: 09/02/2017 Document Reviewed: 09/02/2017 °Elsevier Interactive Patient Education © 2019 [employer] ° °

## 2018-06-29 NOTE — Procedures (Signed)
Interventional Radiology Procedure:   Indications: Endometrial cancer  Procedure: Port placement  Findings: Right jugular port, tip at SVC/RA junction  Complications: None     EBL: Minimal, less than 10 ml  Plan: Discharge in one hour.  Keep port site and incisions dry for at least 24 hours.     Ajdin Macke R. Dilynn Munroe, MD  Pager: [phone]   

## 2018-06-30 ENCOUNTER — Other Ambulatory Visit: Payer: Self-pay

## 2018-06-30 ENCOUNTER — Telehealth: Payer: Self-pay | Admitting: Oncology

## 2018-06-30 ENCOUNTER — Inpatient Hospital Stay (HOSPITAL_BASED_OUTPATIENT_CLINIC_OR_DEPARTMENT_OTHER): Payer: Medicare Other | Admitting: Hematology and Oncology

## 2018-06-30 ENCOUNTER — Ambulatory Visit
Admission: RE | Admit: 2018-06-30 | Discharge: 2018-06-30 | Disposition: A | Discharge status: Home / Self Care | Payer: Medicare Other | Source: Ambulatory Visit | Attending: Radiation Oncology | Admitting: Radiation Oncology

## 2018-06-30 VITALS — BP 130/71 | HR 110 | Temp 98.1°F | Resp 18 | Ht 61.0 in | Wt 199.2 lb

## 2018-06-30 DIAGNOSIS — C55 Malignant neoplasm of uterus, part unspecified: Secondary | ICD-10-CM

## 2018-06-30 DIAGNOSIS — C541 Malignant neoplasm of endometrium: Secondary | ICD-10-CM | POA: Diagnosis not present

## 2018-06-30 DIAGNOSIS — C801 Malignant (primary) neoplasm, unspecified: Secondary | ICD-10-CM

## 2018-06-30 DIAGNOSIS — K5909 Other constipation: Secondary | ICD-10-CM

## 2018-06-30 DIAGNOSIS — Z7189 Other specified counseling: Secondary | ICD-10-CM

## 2018-06-30 DIAGNOSIS — Z510 Encounter for antineoplastic radiation therapy: Secondary | ICD-10-CM | POA: Diagnosis not present

## 2018-06-30 DIAGNOSIS — R590 Localized enlarged lymph nodes: Secondary | ICD-10-CM

## 2018-06-30 DIAGNOSIS — C7951 Secondary malignant neoplasm of bone: Secondary | ICD-10-CM

## 2018-06-30 DIAGNOSIS — G893 Neoplasm related pain (acute) (chronic): Secondary | ICD-10-CM

## 2018-06-30 DIAGNOSIS — C774 Secondary and unspecified malignant neoplasm of inguinal and lower limb lymph nodes: Secondary | ICD-10-CM | POA: Diagnosis not present

## 2018-06-30 DIAGNOSIS — Z7901 Long term (current) use of anticoagulants: Secondary | ICD-10-CM

## 2018-06-30 DIAGNOSIS — I825Z2 Chronic embolism and thrombosis of unspecified deep veins of left distal lower extremity: Secondary | ICD-10-CM

## 2018-06-30 MED ORDER — ONDANSETRON HCL 8 MG PO TABS: 8.0000 mg | ORAL_TABLET | Freq: Three times a day (TID) | ORAL | 1 refills | Status: DC | PRN

## 2018-06-30 MED ORDER — DEXAMETHASONE 4 MG PO TABS: ORAL_TABLET | ORAL | 0 refills | Status: DC

## 2018-06-30 MED ORDER — LIDOCAINE-PRILOCAINE 2.5-2.5 % EX CREA: TOPICAL_CREAM | CUTANEOUS | 3 refills | Status: DC

## 2018-06-30 MED ORDER — PROCHLORPERAZINE MALEATE 10 MG PO TABS: 10.0000 mg | ORAL_TABLET | Freq: Four times a day (QID) | ORAL | 1 refills | Status: DC | PRN

## 2018-06-30 MED FILL — DEXAMETHASONE 4 MG TABLET: 4 | 21 days supply | Qty: 24 | Fill #0

## 2018-06-30 MED FILL — ONDANSETRON HCL 8 MG TABLET: 8 | 10 days supply | Qty: 30 | Fill #0

## 2018-06-30 MED FILL — PROCHLORPERAZINE 10 MG TAB: 10 | 7 days supply | Qty: 30 | Fill #0

## 2018-06-30 MED FILL — LIDOCAINE-PRILOCAINE CREAM: 2.5-2.5 | 30 days supply | Qty: 30 | Fill #0

## 2018-06-30 NOTE — Telephone Encounter (Signed)
Called Daryl and advised him that radiation has been moved to 3:20 pm on 07/02/18 and that she needs to arrive for her PET scan at 9:30.  He verbalized understanding and agreement.

## 2018-07-01 ENCOUNTER — Encounter: Payer: Self-pay | Admitting: Hematology and Oncology

## 2018-07-01 ENCOUNTER — Ambulatory Visit
Admission: RE | Admit: 2018-07-01 | Discharge: 2018-07-01 | Disposition: A | Discharge status: Home / Self Care | Payer: Medicare Other | Source: Ambulatory Visit | Attending: Radiation Oncology | Admitting: Radiation Oncology

## 2018-07-01 ENCOUNTER — Other Ambulatory Visit: Payer: Self-pay

## 2018-07-01 DIAGNOSIS — C801 Malignant (primary) neoplasm, unspecified: Secondary | ICD-10-CM | POA: Insufficient documentation

## 2018-07-01 DIAGNOSIS — Z510 Encounter for antineoplastic radiation therapy: Secondary | ICD-10-CM | POA: Diagnosis not present

## 2018-07-01 NOTE — Assessment & Plan Note (Signed)
I recommend resumption of anticoagulation therapy

## 2018-07-01 NOTE — Assessment & Plan Note (Signed)
We discussed the importance of regular laxative therapy 

## 2018-07-01 NOTE — Assessment & Plan Note (Signed)
She has appointment scheduled to see genetic counselor

## 2018-07-01 NOTE — Assessment & Plan Note (Signed)
I have reviewed the plan of care with the patient and her son With adequate pain control, she appears to be doing very well. PET CT scan is pending Regardless of results of PET scan, she needs to be treated with systemic chemotherapy I have reviewed the molecular study The patient have MSI instability.  She would qualify for immunotherapy.  However, immunotherapy is less likely to give her maximum response to treatment compared to chemotherapy The patient would like to be treated as aggressive as possible  We reviewed the NCCN guidelines We discussed the role of chemotherapy. The intent is of palliative intent.  We discussed some of the risks, benefits, side-effects of carboplatin & Taxol. Treatment is intravenous, every 3 weeks x 6 cycles  Some of the short term side-effects included, though not limited to, including weight loss, life threatening infections, risk of allergic reactions, need for transfusions of blood products, nausea, vomiting, change in bowel habits, loss of hair, admission to hospital for various reasons, and risks of death.   Long term side-effects are also discussed including risks of infertility, permanent damage to nerve function, hearing loss, chronic fatigue, kidney damage with possibility needing hemodialysis, and rare secondary malignancy including bone marrow disorders.  The patient is aware that the response rates discussed earlier is not guaranteed.  After a long discussion, patient made an informed decision to proceed with the prescribed plan of care.   Patient education material was dispensed. We discussed premedication with dexamethasone before chemotherapy. I will prescribe modified dosing with mildly reduced dose chemotherapy due to morbid obesity and high calculated total dose I will continue to see her once a week for supportive care

## 2018-07-01 NOTE — Assessment & Plan Note (Signed)
We have brief discussion about goals of treatment Treatment goal is palliative in nature

## 2018-07-01 NOTE — Progress Notes (Signed)
[place] OFFICE PROGRESS NOTE  Patient Care Team: Nolene Ebbs, MD as PCP - General (Internal Medicine)  ASSESSMENT & PLAN:  Uterine cancer [facility]) I have reviewed the plan of care with the patient and her son With adequate pain control, she appears to be doing very well. PET CT scan is pending Regardless of results of PET scan, she needs to be treated with systemic chemotherapy I have reviewed the molecular study The patient have MSI instability.  She would qualify for immunotherapy.  However, immunotherapy is less likely to give her maximum response to treatment compared to chemotherapy The patient would like to be treated as aggressive as possible  We reviewed the NCCN guidelines We discussed the role of chemotherapy. The intent is of palliative intent.  We discussed some of the risks, benefits, side-effects of carboplatin & Taxol. Treatment is intravenous, every 3 weeks x 6 cycles  Some of the short term side-effects included, though not limited to, including weight loss, life threatening infections, risk of allergic reactions, need for transfusions of blood products, nausea, vomiting, change in bowel habits, loss of hair, admission to hospital for various reasons, and risks of death.   Long term side-effects are also discussed including risks of infertility, permanent damage to nerve function, hearing loss, chronic fatigue, kidney damage with possibility needing hemodialysis, and rare secondary malignancy including bone marrow disorders.  The patient is aware that the response rates discussed earlier is not guaranteed.  After a long discussion, patient made an informed decision to proceed with the prescribed plan of care.   Patient education material was dispensed. We discussed premedication with dexamethasone before chemotherapy. 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) ([place]) She has appointment scheduled to see genetic counselor  Cancer associated pain Her pain control is excellent with introduction of long-acting pain medicine I have spoken with her son in great detail She will continue MS Contin 30 mg She can continue to take immediate release morphine as needed for breakthrough pain  Lower leg DVT (deep venous thromboembolism), chronic, left ([place]) I recommend resumption of anticoagulation therapy  Other constipation We discussed the importance of regular laxative therapy  Goals of care, counseling/discussion We have brief discussion about goals of treatment Treatment goal is palliative in nature   No orders of the defined types were placed in this encounter.   INTERVAL HISTORY: Please see below for problem oriented charting. She returns for further follow-up Her son is available over the telephone for consultation and collaboration of history Her pain was better controlled with addition of MS Contin She denies constipation Her leg swelling is stable She feels well and is ready to start chemotherapy.  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 Uterus +/- tubes/ovaries, neoplastic, with left fallopian tube and ovary - INVASIVE ENDOMETRIOID CARCINOMA (1.5 CM), FIGO GRADE II, ARISING IN A BACKGROUND OF ATYPICAL COMPLEX HYPERPLASIA, CONFINED WITHIN INNER HALF OF THE MYOMETRIUM. - ENDOMETRIAL POLYP WITH ASSOCIATED ATYPICAL COMPLEX HYPERPLASIA. - MYOMETRIUM: LEIOMYOMATA. - CERVIX: BENIGN SQUAMOUS MUCOSA AND ENDOCERVICAL MUCOSA, NO DYSPLASIA OR MALIGNANCY. -  LEFT OVARY: BENIGN OVARIAN TISSUE WITH  ENDOSALPINGOSIS, NO EVIDENCE OF ATYPIA OR MALIGNANCY. - LEFT FALLOPIAN TUBE: NO HISTOLOGIC ABNORMALITIES. - PLEASE SEE ONCOLOGY TEMPLATE FOR DETAIL. 2. Ovary and fallopian tube, right - BENIGN OVARIAN TISSUE WITH ENDOSALPINGOSIS, NO ATYPIA OR MALIGNANCY. - BENIGN FALLOPIAN TUBAL TISSUE, NO PATHOLOGIC ABNORMALITIES. Microscopic Comment 1. UTERUS Specimen: Uterus, cervix, bilateral ovaries and fallopian tubes Procedure: Total hysterectomy and bilateral salpingo-oophorectomy Lymph node sampling performed: No Specimen integrity: Intact Maximum tumor size (cm): 1.5 cm, glass slide measurement Histologic type: Invasive endometrioid carcinoma Grade: FIGO grade II Myometrial invasion: 1 cm where myometrium is 2.3 cm in thickness Cervical stromal involvement: No Extent of involvement of other organs: No Lymph vascular invasion: Not identified Peritoneal washings: Negative ([id number]) Lymph nodes: number examined N/A; number positive N/A TNM code: pT1a, pNX 1 oFf 3IGO Stage (based on pathologic findings, needs clinical correlation): IA  Comments: Sections the endomyometrium away from the grossly identified endometrial polyp show an invasive FIGO grade II endometrioid carcinoma. The tumor is confined within inner half of the myometrium. No angiolymphatic invasion is identified. No cervical stromal involvement is identified. Sections of the grossly identified endometrial polyp show an endometrial polyp with associated atypical compacted hyperplasia with no definitive evidence of carcinoma.    12/07/2017 Imaging    US venous Doppler Right: No evidence of common femoral vein obstruction. Left: Findings consistent with acute deep vein thrombosis involving the left femoral vein, left proximal profunda vein, and left popliteal vein. Unable to adequately interrogate the common femoral and higher, or the calf secondary to significant edema and body habitus    12/07/2017 [facility] Admission    She  presented to the ER and was diagnosed with acute DVT    01/18/2018 - 01/21/2018 Hospital Admission    She was admitted to the hospital for management of severe persistent DVT    01/18/2018 Imaging    US venous Doppler Right: No evidence of common femoral vein obstruction. Left: Findings consistent with acute deep vein thrombosis involving the left common femoral vein, and left popliteal vein.    01/19/2018 Surgery    Pre-operative Diagnosis: Subacute DVT with severe post thrombotic syndrome Post-operative diagnosis:  Same Surgeon:  Erlene Quan C. Donzetta Matters, MD Procedure Performed: 1.  Ultrasound-guided cannulation left small saphenous vein 2.  Left lower extremity and central venography 3.  Intravascular ultrasound of left popliteal, femoral, common femoral, external and common iliac veins and IVC 4.  Stent of left common and external iliac veins with 14 x 60 mm Vici 5.  Moderate sedation with fentanyl and Versed for 50 minutes  Indications: 73 year old female with a history of DVT in October now presents with persistent left lower extremity swelling and ultrasound demonstrating likely persistent DVT.  She has been on Xarelto at this time.  She is now indicated for venogram possible intervention.  Findings: Flow in the left lower extremity was stagnant throughout but by venogram all veins were patent.  There was a focal occlusive area approximately 2 cm in length at the common and external iliac vein junction at the hypogastric on the left.  After stenting and ballooning we had a diameter of 12 millimeters in the stent and venogram demonstrated flow in the lower extremity veins were previously was stagnant and no further residual stenosis in the left common and external iliac vein junction.    04/12/2018 Imaging    US Venous Doppler Right: No evidence of common femoral vein obstruction. Left: There is no evidence of deep  vein thrombosis in the lower extremity. However, portions of this examination  were limited- see technologist comments above. Left groin: Large hypoechoic area with mixed echoes noted measuring nearly 10 cm. Possible  hematoma versus unknown etiology. Ultrasound characteristics of enlarged lymph nodes noted in the groin.       05/15/2018 Imaging    US Venous Doppler Right: No evidence of deep vein thrombosis in the lower extremity. No indirect evidence of obstruction proximal to the inguinal ligament. Left: No reflux was noted in the common femoral vein , femoral vein in the thigh, popliteal vein, great saphenous vein at the saphenofemoral junction, great saphenous vein at the proximal thigh, great saphenous vein at the mid thigh, great saphenous vein  at the distal thigh, great saphenous vein at the knee, origin of the small saphenous vein, proximal small saphenous vein, and mid small saphenous vein. There is no evidence of deep vein thrombosis in the lower extremity. There is no evidence of superficial venous thrombosis. No cystic structure found in the popliteal fossa. Unable to evaluate extension of common femoral vein obstruction proximal to the inguinal ligament.    06/01/2018 Imaging    1. 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 Additionally, there is lucency involving the medial sidewall of the left acetabulum with potential nondisplaced pathologic fracture. Further evaluation with contrast-enhanced pelvic MRI could be performed as clinically indicated. 2. The left pelvic arterial and venous system is encased by this infiltrative left pelvic sidewall mass however while difficult to ascertain, the left external iliac venous stent appears patent.    06/18/2018 Pathology Results    Lymph node for lymphoma, Left Inguinal - METASTATIC ADENOCARCINOMA, SEE COMMENT. Microscopic Comment Immunohistochemistry is positive for cytokeratin 7, PAX8, ER, and PR. Cytokeratin 5/6,and  p63 are negative. The immunoprofile along with the patient's history are consistent with a gynecologic primary.    06/18/2018 Surgery    Pre-op Diagnosis: INGUINAL LYMPHADENOPATHY, PELVIC MASS     Procedure(s): EXCISIONAL BIOPSY DEEP LEFT INGUINAL LYMPH NODE  Surgeon(s): Coralie Keens, MD     06/24/2018 Cancer Staging    Staging form: Corpus Uteri - Carcinoma and Carcinosarcoma, AJCC 8th Edition - Clinical: Stage IVB (cT1a, cN2, pM1) - Signed by [name], MD on 06/24/2018     Genetic Testing    Patient has genetic testing done for MMR on pathology from 06/18/2018. Results revealed patient has the following mutation(s): MMR: abnormal    06/29/2018 Procedure    Placement of a subcutaneous port device. Catheter tip at the SVC and right atrium junction.     Genetic Testing    Patient has genetic testing done for MSI on pathology from 06/18/2018. Results revealed patient has the following mutation(s): MSI: High     Metastasis to lymph nodes ([place])   06/23/2018 Initial Diagnosis    Metastasis to lymph nodes ([place])    07/03/2018 -  Chemotherapy    The patient had palonosetron (ALOXI) injection 0.25 mg, 0.25 mg, Intravenous,  Once, 0 of 6 cycles CARBOplatin (PARAPLATIN) 480 mg in sodium chloride 0.9 % 250 mL chemo infusion, 480 mg (100 % of original dose 482.5 mg), Intravenous,  Once, 0 of 6 cycles Dose modification: 482.5 mg (original dose 482.5 mg, Cycle 1) PACLitaxel (TAXOL) 276 mg in sodium chloride 0.9 % 250 mL chemo infusion (> 26m/m2), 140 mg/m2 = 276 mg (80 % of original dose 175 mg/m2), Intravenous,  Once, 0 of 6 cycles  Dose modification: 140 mg/m2 (80 % of original dose 175 mg/m2, Cycle 1, Reason: Dose Not Tolerated) fosaprepitant (EMEND) 150 mg, dexamethasone (DECADRON) 12 mg in sodium chloride 0.9 % 145 mL IVPB, , Intravenous,  Once, 0 of 6 cycles  for chemotherapy treatment.      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 ([place])   06/24/2018 Initial Diagnosis    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 ([place])     07/03/2018 -  Chemotherapy    The patient had palonosetron (ALOXI) injection 0.25 mg, 0.25 mg, Intravenous,  Once, 0 of 6 cycles CARBOplatin (PARAPLATIN) 480 mg in sodium chloride 0.9 % 250 mL chemo infusion, 480 mg (100 % of original dose 482.5 mg), Intravenous,  Once, 0 of 6 cycles Dose modification: 482.5 mg (original dose 482.5 mg, Cycle 1) PACLitaxel (TAXOL) 276 mg in sodium chloride 0.9 % 250 mL chemo infusion (> 71m/m2), 140 mg/m2 = 276 mg (80 % of original dose 175 mg/m2), Intravenous,  Once, 0 of 6 cycles Dose modification: 140 mg/m2 (80 % of original dose 175 mg/m2, Cycle 1, Reason: Dose Not Tolerated) fosaprepitant (EMEND) 150 mg, dexamethasone (DECADRON) 12 mg in sodium chloride 0.9 % 145 mL IVPB, , Intravenous,  Once, 0 of 6 cycles  for chemotherapy treatment.      REVIEW OF SYSTEMS:   Constitutional: Denies fevers, chills or abnormal weight loss Eyes: Denies blurriness of vision Ears, nose, mouth, throat, and face: Denies mucositis or sore throat Respiratory: Denies cough, dyspnea or wheezes Skin: Denies abnormal skin rashes Lymphatics: Denies new lymphadenopathy or easy bruising Neurological:Denies numbness, tingling or new weaknesses Behavioral/Psych: Mood is stable, no new changes  All other systems were reviewed with the patient and are negative.  I have reviewed the past medical history, past surgical history, social history and family history with the patient and they are unchanged from previous note.  ALLERGIES:  has No Known Allergies.  MEDICATIONS:  Current Outpatient Medications  Medication Sig Dispense Refill  . clopidogrel (PLAVIX) 75 MG tablet Take 1 tablet (75 mg total) by mouth daily with breakfast. 30 tablet 11  . cycloSPORINE (RESTASIS) 0.05 % ophthalmic emulsion Place 1 drop into both eyes 2 (two) times daily.    .Marland Kitchendexamethasone (DECADRON) 4 MG tablet Take 2 tabs at the night before and 2 tabs the morning of chemotherapy, every 3 weeks, by mouth 24  tablet 0  . lidocaine-prilocaine (EMLA) cream Apply to affected area once 30 g 3  . morphine (MS CONTIN) 30 MG 12 hr tablet Take 1 tablet (30 mg total) by mouth every 12 (twelve) hours. 30 tablet 0  . morphine (MSIR) 15 MG tablet Take 1 tablet (15 mg total) by mouth every 4 (four) hours as needed for severe pain. 30 tablet 0  . Olopatadine HCl 0.2 % SOLN Place 1 drop into both eyes daily.    . ondansetron (ZOFRAN) 8 MG tablet Take 1 tablet (8 mg total) by mouth every 8 (eight) hours as needed. 30 tablet 1  . polyethylene glycol (MIRALAX / GLYCOLAX) 17 g packet Take 17 g by mouth daily. 30 each 11  . prochlorperazine (COMPAZINE) 10 MG tablet Take 1 tablet (10 mg total) by mouth every 6 (six) hours as needed (Nausea or vomiting). 30 tablet 1  . rivaroxaban (XARELTO) 20 MG TABS tablet Take 20 mg by mouth daily with breakfast.     No current facility-administered medications for this visit.     PHYSICAL EXAMINATION: ECOG PERFORMANCE STATUS: 1 - Symptomatic but completely ambulatory  Vitals:  06/30/18 1142  BP: 130/71  Pulse: (!) 110  Resp: 18  Temp: 98.1 F (36.7 C)  SpO2: 100%   Filed Weights   06/30/18 1142  Weight: 199 lb 3.2 oz (90.4 kg)    GENERAL:alert, no distress and comfortable SKIN: skin color, texture, turgor are normal, no rashes or significant lesions EYES: normal, Conjunctiva are pink and non-injected, sclera clear OROPHARYNX:no exudate, no erythema and lips, buccal mucosa, and tongue normal  NECK: supple, thyroid normal size, non-tender, without nodularity LYMPH:  no palpable lymphadenopathy in the cervical, axillary or inguinal LUNGS: clear to auscultation and percussion with normal breathing effort HEART: regular rate & rhythm and no murmurs with mild persistent lower extremity edema ABDOMEN:abdomen soft, non-tender and normal bowel sounds Musculoskeletal:no cyanosis of digits and no clubbing.  The port incision site is well-healed NEURO: alert & oriented x 3  with fluent speech, no focal motor/sensory deficits  LABORATORY DATA:  I have reviewed the data as listed    Component Value Date/Time   NA 137 06/29/2018 1325   K 3.5 06/29/2018 1325   CL 102 06/29/2018 1325   CO2 24 06/29/2018 1325   GLUCOSE 114 (H) 06/29/2018 1325   BUN 11 06/29/2018 1325   CREATININE 0.61 06/29/2018 1325   CALCIUM 9.3 06/29/2018 1325   PROT 8.5 (H) 06/29/2018 1325   ALBUMIN 3.6 06/29/2018 1325   AST 22 06/29/2018 1325   ALT 17 06/29/2018 1325   ALKPHOS 89 06/29/2018 1325   BILITOT 0.7 06/29/2018 1325   GFRNONAA >60 06/29/2018 1325   GFRAA >60 06/29/2018 1325    No results found for: SPEP, UPEP  Lab Results  Component Value Date   WBC 6.0 06/29/2018   NEUTROABS 2.4 01/18/2018   HGB 10.4 (L) 06/29/2018   HCT 33.5 (L) 06/29/2018   MCV 87.5 06/29/2018   PLT 311 06/29/2018      Chemistry      Component Value Date/Time   NA 137 06/29/2018 1325   K 3.5 06/29/2018 1325   CL 102 06/29/2018 1325   CO2 24 06/29/2018 1325   BUN 11 06/29/2018 1325   CREATININE 0.61 06/29/2018 1325      Component Value Date/Time   CALCIUM 9.3 06/29/2018 1325   ALKPHOS 89 06/29/2018 1325   AST 22 06/29/2018 1325   ALT 17 06/29/2018 1325   BILITOT 0.7 06/29/2018 1325       RADIOGRAPHIC STUDIES: I have personally reviewed the radiological images as listed and agreed with the findings in the report. Ir Imaging Guided Port Insertion  Result Date: 06/29/2018 INDICATION: 73 year old with uterine cancer.  Port-A-Cath needed for treatment. EXAM: FLUOROSCOPIC AND ULTRASOUND GUIDED PLACEMENT OF A SUBCUTANEOUS PORT COMPARISON:  None. MEDICATIONS: Ancef 2 g; The antibiotic was administered within an appropriate time interval prior to skin puncture. ANESTHESIA/SEDATION: Versed 4.0 mg IV; Fentanyl 200 mcg IV; Moderate Sedation Time:  58 minutes The patient was continuously monitored during the procedure by the interventional radiology nurse under my direct supervision. FLUOROSCOPY  TIME:  36 seconds, 16 mGy COMPLICATIONS: None immediate. PROCEDURE: The procedure, risks, benefits, and alternatives were explained to the patient. Questions regarding the procedure were encouraged and answered. The patient understands and consents to the procedure. Patient was placed supine on the interventional table. Ultrasound confirmed a patent right internal jugular vein. The right chest and neck were cleaned with a skin antiseptic and a sterile drape was placed. Maximal barrier sterile technique was utilized including caps, mask, sterile gowns, sterile gloves, sterile drape,  hand hygiene and skin antiseptic. The right neck was anesthetized with 1% lidocaine. Small incision was made in the right neck with a blade. Micropuncture set was placed in the right internal jugular vein with ultrasound guidance. The micropuncture wire was used for measurement purposes. The right chest was anesthetized with 1% lidocaine with epinephrine. #15 blade was used to make an incision and a subcutaneous port pocket was formed. [place] was assembled. Subcutaneous tunnel was formed with a stiff tunneling device. The port catheter was brought through the subcutaneous tunnel. The port was placed in the subcutaneous pocket and sutured in place. The micropuncture set was exchanged for a peel-away sheath. The catheter was placed through the peel-away sheath and the tip was positioned at the SVC and right atrium junction. Catheter placement was confirmed with fluoroscopy. The port was accessed and flushed with heparinized saline. The port pocket was closed using two layers of absorbable sutures and Dermabond. The vein skin site was closed using a single layer of absorbable suture and Dermabond. Sterile dressings were applied. Patient tolerated the procedure well without an immediate complication. Ultrasound and fluoroscopic images were taken and saved for this procedure. IMPRESSION: Placement of a subcutaneous port device.  Catheter tip at the SVC and right atrium junction. Electronically Signed   By: Markus Daft M.D.   On: 06/29/2018 15:59   Ct Angio Abd/pel W/ And/or W/o  Result Date: 06/01/2018 CLINICAL DATA:  History of previous DVT, post venous intervention and stent placement, now with left groin pain. EXAM: CTV ABDOMEN AND PELVIS WITH CONTRAST TECHNIQUE: Multidetector CT imaging of the abdomen and pelvis was performed using the standard protocol during bolus administration of intravenous contrast. Multiplanar reconstructed images and MIPs were obtained and reviewed to evaluate the vascular anatomy. CONTRAST:  55m ISOVUE-370 IOPAMIDOL (ISOVUE-370) INJECTION 76% Creatinine was [facility] [address] Results: Creatinine 0.6 mg/dL. COMPARISON:  None. FINDINGS: Examination is degraded secondary to patient respiratory artifact. VASCULAR Normal caliber of the abdominal aorta. The major branch vessels of the abdominal aorta appear widely patent given patient respiratory artifact. There is no significant atherosclerotic plaque within the abdominal aorta. The left common and external iliac arteries appear of normal caliber and patent though are circumferentially encased by the infiltrative left pelvic sidewall mass. Veins: The patient has undergone stenting of the left external iliac vein the patency of which is difficult to ascertain however the stent does appear patent at its peripheral aspect (image 80, series 2) and there is symmetric enhancement of the upstream left common iliac vein. The IVC and right pelvic venous system appear widely patent. Note is made of several tiny superficial varicosities within the right lateral and anterior thigh (image 132, series 2). Review of the MIP images confirms the above findings. _________________________________________________________ NON-VASCULAR Lower chest: Limited visualization of the lower thorax is negative for focal airspace opacity or pleural  effusion. Normal heart size.  No pericardial effusion. Hepatobiliary: Normal hepatic contour. No discrete hepatic lesions. Post cholecystectomy. No intra extrahepatic bili duct dilatation. No ascites. Pancreas: Normal appearance of the pancreas. Spleen: Normal appearance of the spleen. Adrenals/Urinary Tract: There is symmetric enhancement and excretion of the bilateral kidneys. No definite renal stones on this postcontrast examination. No discrete renal lesions. No urine obstruction or perinephric stranding. There is mild thickening of the crux of the left adrenal gland without discrete nodule. Normal appearance of the right adrenal gland. Normal appearance of the urinary bladder given degree of  distention. Stomach/Bowel: Rather extensive colonic diverticulosis without evidence of superimposed acute diverticulitis. Suspected small hiatal hernia. No evidence of enteric obstruction. Normal appearance of the terminal ileum and the retrocecal appendix. No pneumoperitoneum, pneumatosis or portal venous gas. Lymphatic: No bulky retroperitoneal, mesenteric, enlarged left inguinal lymph node measuring 5.9 x 2.4 cm (image 107, series 3; coronal image 46, series 7). Otherwise, there is no definitive bulky retroperitoneal or pelvic lymphadenopathy. Reproductive: Post hysterectomy. No discrete adnexal lesion. Small amount of free fluid is seen with the pelvic cul-de-sac. Other/Musculoskeletal: There and abnormal infiltrative mass thickening involving the left pelvic sidewall measuring approximately 8.7 x 4.6 x 9.5 cm (axial image 93, series 3; coronal image 76, series 7). This finding is associated with lucency involving the medial wall of the left acetabulum (images 98 and 99, series 3), with potential nondisplaced pathologic fracture (images 98, 99 and 100, series 3). No definitive associated periostitis. No definite left hip joint effusion. IMPRESSION: 1. Infiltrative mass within the left pelvic sidewall measuring  approximately 9.5 cm with associated pathologically enlarged left inguinal lymph node. Additionally, there is lucency involving the medial sidewall of the left acetabulum with potential nondisplaced pathologic fracture. Further evaluation with contrast-enhanced pelvic MRI could be performed as clinically indicated. 2. The left pelvic arterial and venous system is encased by this infiltrative left pelvic sidewall mass however while difficult to ascertain, the left external iliac venous stent appears patent. Critical Value/emergent results were called by telephone at the time of interpretation on 06/01/2018 at 12:58 pm to Dr. Carlis Abbott (on call vascular surgery), who verbally acknowledged these results. Electronically Signed   By: Sandi Mariscal M.D.   On: 06/01/2018 13:42    All questions were answered. The patient knows to call the clinic with any problems, questions or concerns. No barriers to learning was detected.  I spent 30 minutes counseling the patient face to face. The total time spent in the appointment was 40 minutes and more than 50% was on counseling and review of test results  [name], MD 07/01/2018 11:30 AM

## 2018-07-01 NOTE — Assessment & Plan Note (Signed)
Her pain control is excellent with introduction of long-acting pain medicine I have spoken with her son in great detail She will continue MS Contin 30 mg She can continue to take immediate release morphine as needed for breakthrough pain

## 2018-07-02 ENCOUNTER — Other Ambulatory Visit: Payer: Self-pay

## 2018-07-02 ENCOUNTER — Ambulatory Visit
Admission: RE | Admit: 2018-07-02 | Discharge: 2018-07-02 | Disposition: A | Discharge status: Home / Self Care | Payer: Medicare Other | Source: Ambulatory Visit | Attending: Radiation Oncology | Admitting: Radiation Oncology

## 2018-07-02 ENCOUNTER — Ambulatory Visit (HOSPITAL_COMMUNITY)
Admission: RE | Admit: 2018-07-02 | Discharge: 2018-07-02 | Disposition: A | Discharge status: Home / Self Care | Payer: Medicare Other | Source: Ambulatory Visit | Attending: Hematology and Oncology | Admitting: Hematology and Oncology

## 2018-07-02 DIAGNOSIS — C55 Malignant neoplasm of uterus, part unspecified: Secondary | ICD-10-CM | POA: Insufficient documentation

## 2018-07-02 DIAGNOSIS — I825Z2 Chronic embolism and thrombosis of unspecified deep veins of left distal lower extremity: Secondary | ICD-10-CM | POA: Diagnosis not present

## 2018-07-02 DIAGNOSIS — Z79899 Other long term (current) drug therapy: Secondary | ICD-10-CM | POA: Diagnosis not present

## 2018-07-02 DIAGNOSIS — G893 Neoplasm related pain (acute) (chronic): Secondary | ICD-10-CM | POA: Insufficient documentation

## 2018-07-02 DIAGNOSIS — C7951 Secondary malignant neoplasm of bone: Secondary | ICD-10-CM | POA: Insufficient documentation

## 2018-07-02 DIAGNOSIS — Z90710 Acquired absence of both cervix and uterus: Secondary | ICD-10-CM | POA: Diagnosis not present

## 2018-07-02 DIAGNOSIS — Z510 Encounter for antineoplastic radiation therapy: Secondary | ICD-10-CM | POA: Diagnosis not present

## 2018-07-02 DIAGNOSIS — Z7901 Long term (current) use of anticoagulants: Secondary | ICD-10-CM | POA: Insufficient documentation

## 2018-07-02 DIAGNOSIS — K5900 Constipation, unspecified: Secondary | ICD-10-CM | POA: Insufficient documentation

## 2018-07-02 LAB — GLUCOSE, CAPILLARY: Glucose-Capillary: 97 mg/dL (ref 70–99)

## 2018-07-02 MED ORDER — FLUDEOXYGLUCOSE F - 18 (FDG) INJECTION
9.4300 | Freq: Once | INTRAVENOUS | Status: AC
  Administered 2018-07-02: 9.43 via INTRAVENOUS

## 2018-07-03 ENCOUNTER — Encounter: Payer: Self-pay | Admitting: *Deleted

## 2018-07-03 ENCOUNTER — Telehealth: Payer: Self-pay | Admitting: Hematology and Oncology

## 2018-07-03 ENCOUNTER — Other Ambulatory Visit: Payer: Self-pay

## 2018-07-03 ENCOUNTER — Ambulatory Visit: Payer: Medicare Other

## 2018-07-03 ENCOUNTER — Ambulatory Visit
Admission: RE | Admit: 2018-07-03 | Discharge: 2018-07-03 | Disposition: A | Discharge status: Home / Self Care | Payer: Medicare Other | Source: Ambulatory Visit | Attending: Radiation Oncology | Admitting: Radiation Oncology

## 2018-07-03 DIAGNOSIS — Z510 Encounter for antineoplastic radiation therapy: Secondary | ICD-10-CM | POA: Diagnosis not present

## 2018-07-03 NOTE — Progress Notes (Signed)
Oncology Nurse Navigator Documentation  Oncology Nurse Navigator Flowsheets 07/03/2018  Navigator Location [facility]  Navigator Encounter Type Other/I received a call from rad onc.  Patient states she does not want to come to her infusion appt today.  I updated infusion room charge nurse.  I will also update Dr. Alvy Bimler.   Telephone -  Abnormal Finding Date -  Confirmed Diagnosis Date -  Surgery Date -  Barriers/Navigation Needs Coordination of Care  Education -  Interventions Coordination of Care  Referrals -  Coordination of Care Other  Education Method -  Acuity -  Acuity Level 2 -  Time Spent with Patient 30

## 2018-07-03 NOTE — Telephone Encounter (Signed)
I was informed by nursing staff that the patient has canceled her chemotherapy today I collaborated the history with her son According to her son, she was quite upset due to pain and does not feel ready to begin chemotherapy I will see her as scheduled next week and will discuss pain management with her again I recommend IR morphine before she gets into the car to come in for her radiation treatment, along with MS Contin twice a day as scheduled We will reschedule her chemotherapy next week when she is ready.

## 2018-07-06 ENCOUNTER — Ambulatory Visit
Admission: RE | Admit: 2018-07-06 | Discharge: 2018-07-06 | Disposition: A | Discharge status: Home / Self Care | Payer: Medicare Other | Source: Ambulatory Visit | Attending: Radiation Oncology | Admitting: Radiation Oncology

## 2018-07-06 ENCOUNTER — Encounter: Payer: Self-pay | Admitting: Oncology

## 2018-07-06 ENCOUNTER — Other Ambulatory Visit: Payer: Self-pay

## 2018-07-06 DIAGNOSIS — Z510 Encounter for antineoplastic radiation therapy: Secondary | ICD-10-CM | POA: Diagnosis not present

## 2018-07-06 NOTE — Progress Notes (Signed)
Gynecologic Oncology Multi-Disciplinary Disposition Conference Note  Date of the Conference: 07/06/2018  Patient Name: [name]  Referring Provider: Dr. Ninfa Linden Primary GYN Oncologist: Dr. Denman George  Stage/Disposition:  Colette Ribas Ib recurrent endometrial cancer. Disposition is for radiation for control of local symptoms with chemotherapy.  Repeat imaging after 3 cycles of chemotherapy.   This Multidisciplinary conference took place involving physicians from [place], [place], Radiation Oncology, Pathology, Radiology along with the Gynecologic Oncology Nurse Practitioner and RN.  Comprehensive assessment of the patient's malignancy, staging, need for surgery, chemotherapy, radiation therapy, and need for further testing were reviewed. Supportive measures, both inpatient and following discharge were also discussed. The recommended plan of care is documented. Greater than 35 minutes were spent correlating and coordinating this patient's care.

## 2018-07-07 ENCOUNTER — Other Ambulatory Visit: Payer: Self-pay

## 2018-07-07 ENCOUNTER — Other Ambulatory Visit: Payer: Self-pay | Admitting: Genetic Counselor

## 2018-07-07 ENCOUNTER — Ambulatory Visit
Admission: RE | Admit: 2018-07-07 | Discharge: 2018-07-07 | Disposition: A | Discharge status: Home / Self Care | Payer: Medicare Other | Source: Ambulatory Visit | Attending: Radiation Oncology | Admitting: Radiation Oncology

## 2018-07-07 ENCOUNTER — Telehealth: Payer: Self-pay | Admitting: Hematology and Oncology

## 2018-07-07 ENCOUNTER — Inpatient Hospital Stay (HOSPITAL_BASED_OUTPATIENT_CLINIC_OR_DEPARTMENT_OTHER): Payer: Medicare Other | Admitting: Hematology and Oncology

## 2018-07-07 DIAGNOSIS — Z79899 Other long term (current) drug therapy: Secondary | ICD-10-CM

## 2018-07-07 DIAGNOSIS — Z7901 Long term (current) use of anticoagulants: Secondary | ICD-10-CM

## 2018-07-07 DIAGNOSIS — C774 Secondary and unspecified malignant neoplasm of inguinal and lower limb lymph nodes: Secondary | ICD-10-CM

## 2018-07-07 DIAGNOSIS — C541 Malignant neoplasm of endometrium: Secondary | ICD-10-CM

## 2018-07-07 DIAGNOSIS — C7951 Secondary malignant neoplasm of bone: Secondary | ICD-10-CM | POA: Diagnosis not present

## 2018-07-07 DIAGNOSIS — G893 Neoplasm related pain (acute) (chronic): Secondary | ICD-10-CM

## 2018-07-07 DIAGNOSIS — I825Z2 Chronic embolism and thrombosis of unspecified deep veins of left distal lower extremity: Secondary | ICD-10-CM

## 2018-07-07 DIAGNOSIS — C55 Malignant neoplasm of uterus, part unspecified: Secondary | ICD-10-CM

## 2018-07-07 DIAGNOSIS — Z510 Encounter for antineoplastic radiation therapy: Secondary | ICD-10-CM | POA: Diagnosis not present

## 2018-07-07 DIAGNOSIS — Z5111 Encounter for antineoplastic chemotherapy: Secondary | ICD-10-CM | POA: Diagnosis not present

## 2018-07-07 DIAGNOSIS — K5909 Other constipation: Secondary | ICD-10-CM

## 2018-07-07 DIAGNOSIS — R590 Localized enlarged lymph nodes: Secondary | ICD-10-CM

## 2018-07-07 MED ORDER — MORPHINE SULFATE 15 MG PO TABS: 15.0000 mg | ORAL_TABLET | Freq: Four times a day (QID) | ORAL | 0 refills | Status: DC | PRN

## 2018-07-07 MED ORDER — MORPHINE SULFATE ER 30 MG PO TBCR: 30.0000 mg | EXTENDED_RELEASE_TABLET | Freq: Two times a day (BID) | ORAL | 0 refills | Status: DC

## 2018-07-07 MED FILL — MORPHINE SULFATE IR 15 MG T: 15 | 15 days supply | Qty: 60 | Fill #0

## 2018-07-07 MED FILL — MORPHINE SULF ER 30 MG TAB: 30 | 30 days supply | Qty: 60 | Fill #0

## 2018-07-07 NOTE — Telephone Encounter (Signed)
Called member regarding upcoming Webex appointment, patient does not have access for Webex and needs to be a walk-in visit.  Message to Santiago Glad.

## 2018-07-08 ENCOUNTER — Inpatient Hospital Stay: Payer: Medicare Other

## 2018-07-08 ENCOUNTER — Other Ambulatory Visit: Payer: Self-pay

## 2018-07-08 ENCOUNTER — Telehealth: Payer: Self-pay | Admitting: Hematology and Oncology

## 2018-07-08 ENCOUNTER — Ambulatory Visit
Admission: RE | Admit: 2018-07-08 | Discharge: 2018-07-08 | Disposition: A | Discharge status: Home / Self Care | Payer: Medicare Other | Source: Ambulatory Visit | Attending: Radiation Oncology | Admitting: Radiation Oncology

## 2018-07-08 ENCOUNTER — Inpatient Hospital Stay (HOSPITAL_BASED_OUTPATIENT_CLINIC_OR_DEPARTMENT_OTHER): Payer: Medicare Other | Admitting: Genetic Counselor

## 2018-07-08 ENCOUNTER — Encounter: Payer: Self-pay | Admitting: Hematology and Oncology

## 2018-07-08 ENCOUNTER — Encounter: Payer: Self-pay | Admitting: Genetic Counselor

## 2018-07-08 DIAGNOSIS — Z803 Family history of malignant neoplasm of breast: Secondary | ICD-10-CM

## 2018-07-08 DIAGNOSIS — C55 Malignant neoplasm of uterus, part unspecified: Secondary | ICD-10-CM

## 2018-07-08 DIAGNOSIS — D126 Benign neoplasm of colon, unspecified: Secondary | ICD-10-CM

## 2018-07-08 DIAGNOSIS — Z7183 Encounter for nonprocreative genetic counseling: Secondary | ICD-10-CM

## 2018-07-08 DIAGNOSIS — Z510 Encounter for antineoplastic radiation therapy: Secondary | ICD-10-CM | POA: Diagnosis not present

## 2018-07-08 DIAGNOSIS — C801 Malignant (primary) neoplasm, unspecified: Secondary | ICD-10-CM

## 2018-07-08 NOTE — Progress Notes (Signed)
REFERRING PROVIDER: [name], MD [place], [place] [address]  PRIMARY PROVIDER:  Nolene Ebbs, MD  PRIMARY REASON FOR VISIT:  1. Malignant neoplasm of uterus, unspecified site ([place])   2. Family history of breast cancer   3. 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 Adenomatous polyp of colon, unspecified part of colon      HISTORY OF PRESENT ILLNESS:   [name], a 73 y.o. female, was seen for a [facility] cancer genetics consultation at the request of Dr. Alvy Bimler due to a personal and family history of cancer.  [name] presents to clinic today to discuss the possibility of a hereditary predisposition to cancer, genetic testing, and to further clarify her future cancer risks, as well as potential cancer risks for family members.   In 2012, at the age of 43, [name] was diagnosed with uterine cancer. She recently had a recurrence.  The tumor is MSI-H, and showed loss of MLH1 and PMS2. The treatment plan radiation and chemotherapy.    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 Uterus +/- tubes/ovaries, neoplastic, with left fallopian tube and ovary - INVASIVE ENDOMETRIOID CARCINOMA (1.5 CM), FIGO GRADE II, ARISING IN A BACKGROUND OF ATYPICAL COMPLEX HYPERPLASIA, CONFINED WITHIN INNER HALF OF THE MYOMETRIUM. - ENDOMETRIAL POLYP WITH ASSOCIATED ATYPICAL COMPLEX HYPERPLASIA. - MYOMETRIUM: LEIOMYOMATA. - CERVIX: BENIGN SQUAMOUS MUCOSA AND ENDOCERVICAL MUCOSA, NO DYSPLASIA OR MALIGNANCY. - LEFT OVARY: BENIGN OVARIAN TISSUE WITH ENDOSALPINGOSIS, NO EVIDENCE OF ATYPIA OR MALIGNANCY. - LEFT FALLOPIAN TUBE: NO HISTOLOGIC ABNORMALITIES. - PLEASE SEE ONCOLOGY TEMPLATE FOR DETAIL. 2. Ovary and fallopian tube, right - BENIGN OVARIAN TISSUE WITH  ENDOSALPINGOSIS, NO ATYPIA OR MALIGNANCY. - BENIGN FALLOPIAN TUBAL TISSUE, NO PATHOLOGIC ABNORMALITIES. Microscopic Comment 1. UTERUS Specimen: Uterus, cervix, bilateral ovaries and fallopian tubes Procedure: Total hysterectomy and bilateral salpingo-oophorectomy Lymph node sampling performed: No Specimen integrity: Intact Maximum tumor size (cm): 1.5 cm, glass slide measurement Histologic type: Invasive endometrioid carcinoma Grade: FIGO grade II Myometrial invasion: 1 cm where myometrium is 2.3 cm in thickness Cervical stromal involvement: No Extent of involvement of other organs: No Lymph vascular invasion: Not identified Peritoneal washings: Negative ([id number]) Lymph nodes: number examined N/A; number positive N/A TNM code: pT1a, pNX 1 oFf 3IGO Stage (based on pathologic findings, needs clinical correlation): IA  Comments: Sections the endomyometrium away from the grossly identified endometrial polyp show an invasive FIGO grade II endometrioid carcinoma. The tumor is confined within inner half of the myometrium. No angiolymphatic invasion is identified. No cervical stromal involvement is identified. Sections of the grossly identified endometrial polyp show an endometrial polyp with associated atypical compacted hyperplasia with no definitive evidence of carcinoma.    12/07/2017 Imaging    US venous Doppler Right: No evidence of common femoral vein obstruction. Left: Findings consistent with acute deep vein thrombosis involving the left femoral vein, left proximal profunda vein, and left popliteal vein. Unable to adequately interrogate the common femoral and higher, or the calf secondary to significant edema and body habitus    12/07/2017 [facility] Admission    She presented to the ER and was diagnosed with acute DVT    01/18/2018 - 01/21/2018 Hospital Admission    She was admitted to the hospital for management of severe persistent  DVT    01/18/2018 Imaging    US venous  Doppler Right: No evidence of common femoral vein obstruction. Left: Findings consistent with acute deep vein thrombosis involving the left common femoral vein, and left popliteal vein.    01/19/2018 Surgery    Pre-operative Diagnosis: Subacute DVT with severe post thrombotic syndrome Post-operative diagnosis:  Same Surgeon:  Erlene Quan C. Donzetta Matters, MD Procedure Performed: 1.  Ultrasound-guided cannulation left small saphenous vein 2.  Left lower extremity and central venography 3.  Intravascular ultrasound of left popliteal, femoral, common femoral, external and common iliac veins and IVC 4.  Stent of left common and external iliac veins with 14 x 60 mm Vici 5.  Moderate sedation with fentanyl and Versed for 50 minutes  Indications: 73 year old female with a history of DVT in October now presents with persistent left lower extremity swelling and ultrasound demonstrating likely persistent DVT.  She has been on Xarelto at this time.  She is now indicated for venogram possible intervention.  Findings: Flow in the left lower extremity was stagnant throughout but by venogram all veins were patent.  There was a focal occlusive area approximately 2 cm in length at the common and external iliac vein junction at the hypogastric on the left.  After stenting and ballooning we had a diameter of 12 millimeters in the stent and venogram demonstrated flow in the lower extremity veins were previously was stagnant and no further residual stenosis in the left common and external iliac vein junction.    04/12/2018 Imaging    US Venous Doppler Right: No evidence of common femoral vein obstruction. Left: There is no evidence of deep vein thrombosis in the lower extremity. However, portions of this examination were limited- see technologist comments above. Left groin: Large hypoechoic area with mixed echoes noted measuring nearly 10 cm. Possible  hematoma versus unknown etiology. Ultrasound characteristics of enlarged  lymph nodes noted in the groin.       05/15/2018 Imaging    US Venous Doppler Right: No evidence of deep vein thrombosis in the lower extremity. No indirect evidence of obstruction proximal to the inguinal ligament. Left: No reflux was noted in the common femoral vein , femoral vein in the thigh, popliteal vein, great saphenous vein at the saphenofemoral junction, great saphenous vein at the proximal thigh, great saphenous vein at the mid thigh, great saphenous vein  at the distal thigh, great saphenous vein at the knee, origin of the small saphenous vein, proximal small saphenous vein, and mid small saphenous vein. There is no evidence of deep vein thrombosis in the lower extremity. There is no evidence of superficial venous thrombosis. No cystic structure found in the popliteal fossa. Unable to evaluate extension of common femoral vein obstruction proximal to the inguinal ligament.    06/01/2018 Imaging    1. 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 Additionally, there is lucency involving the medial sidewall of the left acetabulum with potential nondisplaced pathologic fracture. Further evaluation with contrast-enhanced pelvic MRI could be performed as clinically indicated. 2. The left pelvic arterial and venous system is encased by this infiltrative left pelvic sidewall mass however while difficult to ascertain, the left external iliac venous stent appears patent.    06/18/2018 Pathology Results    Lymph node for lymphoma, Left Inguinal - METASTATIC ADENOCARCINOMA, SEE COMMENT. Microscopic Comment Immunohistochemistry is positive for cytokeratin 7, PAX8, ER, and PR. Cytokeratin 5/6,and p63 are negative. The  immunoprofile along with the patient's history are consistent with a gynecologic primary.    06/18/2018 Surgery    Pre-op Diagnosis: INGUINAL LYMPHADENOPATHY, PELVIC MASS      Procedure(s): EXCISIONAL BIOPSY DEEP LEFT INGUINAL LYMPH NODE  Surgeon(s): Coralie Keens, MD     06/24/2018 Cancer Staging    Staging form: Corpus Uteri - Carcinoma and Carcinosarcoma, AJCC 8th Edition - Clinical: Stage IVB (cT1a, cN2, pM1) - Signed by [name], MD on 06/24/2018     Genetic Testing    Patient has genetic testing done for MMR on pathology from 06/18/2018. Results revealed patient has the following mutation(s): MMR: abnormal    06/29/2018 Procedure    Placement of a subcutaneous port device. Catheter tip at the SVC and right atrium junction.     Genetic Testing    Patient has genetic testing done for MSI on pathology from 06/18/2018. Results revealed patient has the following mutation(s): MSI: High    07/02/2018 PET scan    Previous hysterectomy, with asymmetric focus of hypermetabolic activity in the left vaginal cuff, suspicious for residual or recurrent carcinoma.  Large hypermetabolic soft tissue mass involving the left pelvic sidewall and acetabulum, consistent with metastatic disease.  No evidence metastatic disease within the abdomen, chest, or neck.     Metastasis to lymph nodes ([place])   06/23/2018 Initial Diagnosis    Metastasis to lymph nodes ([place])    07/03/2018 -  Chemotherapy    The patient had palonosetron (ALOXI) injection 0.25 mg, 0.25 mg, Intravenous,  Once, 0 of 6 cycles CARBOplatin (PARAPLATIN) 480 mg in sodium chloride 0.9 % 250 mL chemo infusion, 480 mg (100 % of original dose 482.5 mg), Intravenous,  Once, 0 of 6 cycles Dose modification: 482.5 mg (original dose 482.5 mg, Cycle 1) PACLitaxel (TAXOL) 276 mg in sodium chloride 0.9 % 250 mL chemo infusion (> 84m/m2), 140 mg/m2 = 276 mg (80 % of original dose 175 mg/m2), Intravenous,  Once, 0 of 6 cycles Dose modification: 140 mg/m2 (80 % of original dose 175 mg/m2, Cycle 1, Reason: Dose Not Tolerated) fosaprepitant (EMEND) 150 mg, dexamethasone (DECADRON) 12 mg in sodium chloride 0.9 %  145 mL IVPB, , Intravenous,  Once, 0 of 6 cycles  for chemotherapy treatment.      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   06/24/2018 Initial Diagnosis    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    07/03/2018 -  Chemotherapy    The patient had palonosetron (ALOXI) injection 0.25 mg, 0.25 mg, Intravenous,  Once, 0 of 6 cycles CARBOplatin (PARAPLATIN) 480 mg in sodium chloride 0.9 % 250 mL chemo infusion, 480 mg (100 % of original dose 482.5 mg), Intravenous,  Once, 0 of 6 cycles Dose modification: 482.5 mg (original dose 482.5 mg, Cycle 1) PACLitaxel (TAXOL) 276 mg in sodium chloride 0.9 % 250 mL chemo infusion (> 864mm2), 140 mg/m2 = 276 mg (80 % of original dose 175 mg/m2), Intravenous,  Once, 0 of 6 cycles Dose modification: 140 mg/m2 (80 % of original dose 175 mg/m2, Cycle 1, Reason: Dose Not Tolerated) fosaprepitant (EMEND) 150 mg, dexamethasone (DECADRON) 12 mg in sodium chloride 0.9 % 145 mL IVPB, , Intravenous,  Once, 0 of 6 cycles  for chemotherapy treatment.       RISK FACTORS:  Menarche was at age [age] First live birth at age 73 OCP use for approximately <5 years.  Ovaries intact: no.  Hysterectomy: yes.  Menopausal status: postmenopausal.  HRT use: 0 years. Colonoscopy: yes; 1 polyp. Mammogram  within the last year: yes. Number of breast biopsies: 0. Up to date with pelvic exams: yes. Any excessive radiation exposure in the past: no  Past Medical History:  Diagnosis Date  . Acute upper respiratory infection 07/06/2014  . Anemia   . Arthritis    Back   . Colon polyp    Tubular Adenoma   . Cough productive of clear sputum 06/22/2014  . Family history of breast cancer   . GERD (gastroesophageal reflux disease)   . History of right bundle branch block (RBBB)   . HOH (hard of hearing)   . Hypertension    had in the past, is no longer on medication for this and blood pressures are WNL  . 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 Primary localized osteoarthritis of right knee   . Uterine cancer ([place])  06/23/2018    Past Surgical History:  Procedure Laterality Date  . ABDOMINAL HYSTERECTOMY  2012  . CHOLECYSTECTOMY N/A 03/09/2013   Procedure: LAPAROSCOPIC CHOLECYSTECTOMY;  Surgeon: Gayland Curry, MD;  Location: [facility];  Service: General;  Laterality: N/A;  . COLONOSCOPY W/ BIOPSIES    . IR IMAGING GUIDED PORT INSERTION  06/29/2018  . LARYNGOSCOPY Left 03/14/2017   Procedure: LARYNGOSCOPY;  Surgeon: Helayne Seminole, MD;  Location: [facility];  Service: ENT;  Laterality: Left;  . LOWER EXTREMITY VENOGRAPHY Left 01/19/2018   Procedure: LOWER EXTREMITY VENOGRAPHY;  Surgeon: Waynetta Sandy, MD;  Location: [facility];  Service: Cardiovascular;  Laterality: Left;  . LYMPH NODE BIOPSY Left 06/18/2018   Procedure: EXCISIONAL BIOPSY LEFT INGUINAL LYMPH NODE;  Surgeon: Coralie Keens, MD;  Location: [facility];  Service: General;  Laterality: Left;  . PERIPHERAL VASCULAR INTERVENTION Left 01/19/2018   Procedure: PERIPHERAL VASCULAR INTERVENTION;  Surgeon: Waynetta Sandy, MD;  Location: [place] [facility];  Service: Cardiovascular;  Laterality: Left;  LEFT ILIAC VENOUS  . TOTAL KNEE ARTHROPLASTY  04/29/2011   Procedure: TOTAL KNEE ARTHROPLASTY;  Surgeon: Lorn Junes, MD;  Location: [facility];  Service: Orthopedics;  Laterality: Left;  DR [place] THIS CASE  . TOTAL KNEE ARTHROPLASTY Right 07/04/2014   Procedure: TOTAL KNEE ARTHROPLASTY;  Surgeon: Elsie Saas, MD;  Location: [facility];  Service: Orthopedics;  Laterality: Right;    Social History   Socioeconomic History  . Marital status: Divorced    Spouse name: Not on file  . Number of children: 1  . Years of education: Not on file  . Highest education level: Not on file  Occupational History  . Occupation: Retired   Scientific laboratory technician  . Financial resource strain: Not on file  . Food insecurity:    Worry: Not on file    Inability: Not on file  . Transportation needs:    Medical: Not on file    Non-medical:  Not on file  Tobacco Use  . Smoking status: Former Smoker    Years: 1.00    Last attempt to quit: 04/22/1988    Years since quitting: 30.2  . Smokeless tobacco: Never Used  Substance and Sexual Activity  . Alcohol use: No  . Drug use: No  . Sexual activity: Yes    Birth control/protection: Surgical  Lifestyle  . Physical activity:    Days per week: Not on file    Minutes per session: Not on file  . Stress: Not on file  Relationships  . Social connections:    Talks on phone: Not on file  Gets together: Not on file    Attends religious service: Not on file    Active member of club or organization: Not on file    Attends meetings of clubs or organizations: Not on file    Relationship status: Not on file  Other Topics Concern  . Not on file  Social History Narrative   Daily caffeine      FAMILY HISTORY:  We obtained a detailed, 4-generation family history.  Significant diagnoses are listed below: Family History  Problem Relation Age of Onset  . Arthritis Mother   . Hypertension Mother   . Alzheimer's disease Father   . Diabetes Sister   . Hypertension Sister   . Hypertension Brother   . Stroke Brother   . Hypertension Brother   . Hypertension Sister   . Hypertension Sister   . Hypertension Sister   . Breast cancer Other        Niece  . Breast cancer Niece 62       sister's daughter  . Anesthesia problems Neg Hx   . Hypotension Neg Hx   . Malignant hyperthermia Neg Hx   . Pseudochol deficiency Neg Hx   . Colon cancer Neg Hx     The patient has one son who is cancer free. She has two brothers and four sisters who are cancer free.  One sister had a daughter who has breast cancer in her early 74's.  Both parents are deceased.  The patient's mother had arthritis and high blood pressure.  She had 6-7 siblings, none reported to have cancer.  The maternal grandparents died of non-cancer related issues.  The patient's father died from complications of dementia.  He had  five siblings who died from non cancer related issues.  The paternal grandparents are deceased from non-cancer related issues.  Ms. Schryver is unaware of previous family history of genetic testing for hereditary cancer risks. Patient's maternal ancestors are of African American descent, and paternal ancestors are of African American descent. There is no reported Ashkenazi Jewish ancestry. There is no known consanguinity.  GENETIC COUNSELING ASSESSMENT: [name] is a 73 y.o. female with a personal and family history of cancer which is somewhat suggestive of a hereditary cancer syndrome and predisposition to cancer. We, therefore, discussed and recommended the following at today's visit.   DISCUSSION: We discussed that 3 - 5% of uterine cancer is hereditary, with most cases associated with Lynch syndrome.  There are other genes that can be associated with hereditary uterine cancer syndromes.  These include PTEN mutations.  We discussed that her family history is not overly concerning for Lynch syndrome.  However, tumor testing suggests that there could be a mutation in either MLH1 or PMS2.  According to NCCN, most cases of uterine and colon cancer with this pattern will have a sporadic cancer.  However, some will have an MLH1 or PMS2 mutation.  Based on the young age of onset of breast cancer in her niece, and the lack of cancer in the family, I would most likely suspect a PMS2 mutation if anything. Her nieces young breast cancer can also be associated with hereditary breast cancer syndromes.  We can offer testing for breast cancer genes.  We discussed that testing is beneficial for several reasons including knowing how to follow individuals after completing their treatment, identifying whether potential treatment options such as systemic therapy would be beneficial, and understand if other family members could be at risk for cancer and allow them to  undergo genetic testing.   We reviewed the  characteristics, features and inheritance patterns of hereditary cancer syndromes. We also discussed genetic testing, including the appropriate family members to test, the process of testing, insurance coverage and turn-around-time for results. We discussed the implications of a negative, positive and/or variant of uncertain significant result. We recommended [name] pursue genetic testing for the common hereditary cancer gene panel. The Common Hereditary Gene Panel offered by [facility] includes sequencing and/or deletion duplication testing of the following 48 genes: APC, ATM, AXIN2, BARD1, BMPR1A, BRCA1, BRCA2, BRIP1, CDH1, CDK4, CDKN2A (p14ARF), CDKN2A (p16INK4a), CHEK2, CTNNA1, DICER1, EPCAM (Deletion/duplication testing only), GREM1 (promoter region deletion/duplication testing only), KIT, MEN1, MLH1, MSH2, MSH3, MSH6, MUTYH, NBN, NF1, NHTL1, PALB2, PDGFRA, PMS2, POLD1, POLE, PTEN, RAD50, RAD51C, RAD51D, RNF43, SDHB, SDHC, SDHD, SMAD4, SMARCA4. STK11, TP53, TSC1, TSC2, and VHL.  The following genes were evaluated for sequence changes only: SDHA and HOXB13 c.251G>A variant only.   Based on [name]'s personal and family history of cancer, she meets medical criteria for genetic testing. Despite that she meets criteria, she may still have an out of pocket cost. We discussed that if her out of pocket cost for testing is over $100, the laboratory will call and confirm whether she wants to proceed with testing.  If the out of pocket cost of testing is less than $100 she will be billed by the genetic testing laboratory.   PLAN: After considering the risks, benefits, and limitations, [name] provided informed consent to pursue genetic testing and the blood sample was sent to [facility] for analysis of the common hereditary cancer panel. Results should be available within approximately 2-3 weeks' time, at which point they will be disclosed by telephone to [name], as will any additional  recommendations warranted by these results. [name] will receive a summary of her genetic counseling visit and a copy of her results once available. This information will also be available in Epic.   Lastly, we encouraged [name] to remain in contact with cancer genetics annually so that we can continuously update the family history and inform her of any changes in cancer genetics and testing that may be of benefit for this family.   [name]'s questions were answered to her satisfaction today. Our contact information was provided should additional questions or concerns arise. Thank you for the referral and allowing [place] to share in the care of your patient.   Shriyans Kuenzi P. Florene Glen, [place], [facility] Certified Genetic Counselor Santiago [email or web address] phone: [phone]  The patient was seen for a total of 45 minutes in face-to-face genetic counseling.  This patient was discussed with Drs. Magrinat, Lindi Adie and/or Burr Medico who agrees with the above.    _______________________________________________________________________ For Office Staff:  Number of people involved in session: 1 Was an Intern/ student involved with case: no

## 2018-07-08 NOTE — Assessment & Plan Note (Signed)
She denies bleeding complications from Xarelto.  She will continue

## 2018-07-08 NOTE — Assessment & Plan Note (Signed)
I have reviewed the plan of care with the patient and her son again She will continue radiation therapy for palliative pain control In addition, I recommend systemic chemotherapy The risk, benefits, side effects of treatment were discussed with the patient and her son and he agreed to proceed She is scheduled for treatment end of the week and I will continue to see her weekly for supportive care

## 2018-07-08 NOTE — Assessment & Plan Note (Signed)
Her pain control is stable I have refilled her prescription MS Contin and recommend her to continue IR morphine as needed We discussed narcotic refill policy

## 2018-07-08 NOTE — Progress Notes (Signed)
[place] OFFICE PROGRESS NOTE  Patient Care Team: Nolene Ebbs, MD as PCP - General (Internal Medicine)  ASSESSMENT & PLAN:  Uterine cancer [facility]) I have reviewed the plan of care with the patient and her son again She will continue radiation therapy for palliative pain control In addition, I recommend systemic chemotherapy The risk, benefits, side effects of treatment were discussed with the patient and her son and he agreed to proceed She is scheduled for treatment end of the week and I will continue to see her weekly for supportive care  Cancer associated pain Her pain control is stable I have refilled her prescription MS Contin and recommend her to continue IR morphine as needed We discussed narcotic refill policy  Lower leg DVT (deep venous thromboembolism), chronic, left ([place]) She denies bleeding complications from Xarelto.  She will continue  Other constipation We discussed the importance of laxative therapy   No orders of the defined types were placed in this encounter.   INTERVAL HISTORY: Please see below for problem oriented charting. 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 Uterus +/- tubes/ovaries, neoplastic, with left fallopian tube and ovary - INVASIVE ENDOMETRIOID CARCINOMA (1.5 CM), FIGO GRADE II, ARISING IN A BACKGROUND OF ATYPICAL COMPLEX HYPERPLASIA, CONFINED WITHIN INNER HALF OF THE MYOMETRIUM. - ENDOMETRIAL POLYP WITH ASSOCIATED ATYPICAL COMPLEX HYPERPLASIA. - MYOMETRIUM:  LEIOMYOMATA. - CERVIX: BENIGN SQUAMOUS MUCOSA AND ENDOCERVICAL MUCOSA, NO DYSPLASIA OR MALIGNANCY. - LEFT OVARY: BENIGN OVARIAN TISSUE WITH ENDOSALPINGOSIS, NO EVIDENCE OF ATYPIA OR MALIGNANCY. - LEFT FALLOPIAN TUBE: NO HISTOLOGIC ABNORMALITIES. - PLEASE SEE ONCOLOGY TEMPLATE FOR DETAIL. 2. Ovary and fallopian tube, right - BENIGN OVARIAN TISSUE WITH ENDOSALPINGOSIS, NO ATYPIA OR MALIGNANCY. - BENIGN FALLOPIAN TUBAL TISSUE, NO PATHOLOGIC ABNORMALITIES. Microscopic Comment 1. UTERUS Specimen: Uterus, cervix, bilateral ovaries and fallopian tubes Procedure: Total hysterectomy and bilateral salpingo-oophorectomy Lymph node sampling performed: No Specimen integrity: Intact Maximum tumor size (cm): 1.5 cm, glass slide measurement Histologic type: Invasive endometrioid carcinoma Grade: FIGO grade II Myometrial invasion: 1 cm where myometrium is 2.3 cm in thickness Cervical stromal involvement: No Extent of involvement of other organs: No Lymph vascular invasion: Not identified Peritoneal washings: Negative ([id number]) Lymph nodes: number examined N/A; number positive N/A TNM code: pT1a, pNX 1 oFf 3IGO Stage (based on pathologic findings, needs clinical correlation): IA  Comments: Sections the endomyometrium away from the grossly identified endometrial polyp show an invasive FIGO grade II endometrioid carcinoma. The tumor is confined within inner half of the myometrium. No angiolymphatic invasion is identified. No cervical stromal involvement is identified. Sections of the grossly identified endometrial polyp show an endometrial polyp with associated atypical compacted hyperplasia with no definitive evidence of carcinoma.    12/07/2017 Imaging    US venous Doppler Right: No evidence of common femoral vein obstruction. Left: Findings consistent with acute deep vein thrombosis involving the left femoral vein, left proximal profunda vein, and left popliteal vein. Unable to adequately  interrogate the common femoral and higher, or the calf secondary to  significant edema and body habitus    12/07/2017 [facility] Admission    She presented to the ER and was diagnosed with acute DVT    01/18/2018 - 01/21/2018 Hospital Admission    She was admitted to the hospital for management of severe persistent DVT    01/18/2018 Imaging    US venous Doppler Right: No evidence of common femoral vein obstruction. Left: Findings consistent with acute deep vein thrombosis involving the left common femoral vein, and left popliteal vein.    01/19/2018 Surgery    Pre-operative Diagnosis: Subacute DVT with severe post thrombotic syndrome Post-operative diagnosis:  Same Surgeon:  Erlene Quan C. Donzetta Matters, MD Procedure Performed: 1.  Ultrasound-guided cannulation left small saphenous vein 2.  Left lower extremity and central venography 3.  Intravascular ultrasound of left popliteal, femoral, common femoral, external and common iliac veins and IVC 4.  Stent of left common and external iliac veins with 14 x 60 mm Vici 5.  Moderate sedation with fentanyl and Versed for 50 minutes  Indications: 73 year old female with a history of DVT in October now presents with persistent left lower extremity swelling and ultrasound demonstrating likely persistent DVT.  She has been on Xarelto at this time.  She is now indicated for venogram possible intervention.  Findings: Flow in the left lower extremity was stagnant throughout but by venogram all veins were patent.  There was a focal occlusive area approximately 2 cm in length at the common and external iliac vein junction at the hypogastric on the left.  After stenting and ballooning we had a diameter of 12 millimeters in the stent and venogram demonstrated flow in the lower extremity veins were previously was stagnant and no further residual stenosis in the left common and external iliac vein junction.    04/12/2018 Imaging    US Venous Doppler Right: No evidence of  common femoral vein obstruction. Left: There is no evidence of deep vein thrombosis in the lower extremity. However, portions of this examination were limited- see technologist comments above. Left groin: Large hypoechoic area with mixed echoes noted measuring nearly 10 cm. Possible  hematoma versus unknown etiology. Ultrasound characteristics of enlarged lymph nodes noted in the groin.       05/15/2018 Imaging    US Venous Doppler Right: No evidence of deep vein thrombosis in the lower extremity. No indirect evidence of obstruction proximal to the inguinal ligament. Left: No reflux was noted in the common femoral vein , femoral vein in the thigh, popliteal vein, great saphenous vein at the saphenofemoral junction, great saphenous vein at the proximal thigh, great saphenous vein at the mid thigh, great saphenous vein  at the distal thigh, great saphenous vein at the knee, origin of the small saphenous vein, proximal small saphenous vein, and mid small saphenous vein. There is no evidence of deep vein thrombosis in the lower extremity. There is no evidence of superficial venous thrombosis. No cystic structure found in the popliteal fossa. Unable to evaluate extension of common femoral vein obstruction proximal to the inguinal ligament.    06/01/2018 Imaging    1. 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 Additionally, there is lucency involving the medial sidewall of the left acetabulum with potential nondisplaced pathologic fracture. Further evaluation with contrast-enhanced pelvic MRI could be performed as clinically indicated. 2. The left pelvic arterial and venous system is encased by this infiltrative left pelvic sidewall mass however while difficult  to ascertain, the left external iliac venous stent appears patent.    06/18/2018 Pathology Results    Lymph node for lymphoma, Left Inguinal -  METASTATIC ADENOCARCINOMA, SEE COMMENT. Microscopic Comment Immunohistochemistry is positive for cytokeratin 7, PAX8, ER, and PR. Cytokeratin 5/6,and p63 are negative. The immunoprofile along with the patient's history are consistent with a gynecologic primary.    06/18/2018 Surgery    Pre-op Diagnosis: INGUINAL LYMPHADENOPATHY, PELVIC MASS     Procedure(s): EXCISIONAL BIOPSY DEEP LEFT INGUINAL LYMPH NODE  Surgeon(s): Coralie Keens, MD     06/24/2018 Cancer Staging    Staging form: Corpus Uteri - Carcinoma and Carcinosarcoma, AJCC 8th Edition - Clinical: Stage IVB (cT1a, cN2, pM1) - Signed by [name], MD on 06/24/2018     Genetic Testing    Patient has genetic testing done for MMR on pathology from 06/18/2018. Results revealed patient has the following mutation(s): MMR: abnormal    06/29/2018 Procedure    Placement of a subcutaneous port device. Catheter tip at the SVC and right atrium junction.     Genetic Testing    Patient has genetic testing done for MSI on pathology from 06/18/2018. Results revealed patient has the following mutation(s): MSI: High    07/02/2018 PET scan    Previous hysterectomy, with asymmetric focus of hypermetabolic activity in the left vaginal cuff, suspicious for residual or recurrent carcinoma.  Large hypermetabolic soft tissue mass involving the left pelvic sidewall and acetabulum, consistent with metastatic disease.  No evidence metastatic disease within the abdomen, chest, or neck.     Metastasis to lymph nodes ([place])   06/23/2018 Initial Diagnosis    Metastasis to lymph nodes ([place])    07/03/2018 -  Chemotherapy    The patient had palonosetron (ALOXI) injection 0.25 mg, 0.25 mg, Intravenous,  Once, 0 of 6 cycles CARBOplatin (PARAPLATIN) 480 mg in sodium chloride 0.9 % 250 mL chemo infusion, 480 mg (100 % of original dose 482.5 mg), Intravenous,  Once, 0 of 6 cycles Dose modification: 482.5 mg (original dose 482.5 mg, Cycle 1) PACLitaxel  (TAXOL) 276 mg in sodium chloride 0.9 % 250 mL chemo infusion (> 47m/m2), 140 mg/m2 = 276 mg (80 % of original dose 175 mg/m2), Intravenous,  Once, 0 of 6 cycles Dose modification: 140 mg/m2 (80 % of original dose 175 mg/m2, Cycle 1, Reason: Dose Not Tolerated) fosaprepitant (EMEND) 150 mg, dexamethasone (DECADRON) 12 mg in sodium chloride 0.9 % 145 mL IVPB, , Intravenous,  Once, 0 of 6 cycles  for chemotherapy treatment.      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 ([place]   06/24/2018 Initial Diagnosis    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    07/03/2018 -  Chemotherapy    The patient had palonosetron (ALOXI) injection 0.25 mg, 0.25 mg, Intravenous,  Once, 0 of 6 cycles CARBOplatin (PARAPLATIN) 480 mg in sodium chloride 0.9 % 250 mL chemo infusion, 480 mg (100 % of original dose 482.5 mg), Intravenous,  Once, 0 of 6 cycles Dose modification: 482.5 mg (original dose 482.5 mg, Cycle 1) PACLitaxel (TAXOL) 276 mg in sodium chloride 0.9 % 250 mL chemo infusion (> 836mm2), 140 mg/m2 = 276 mg (80 % of original dose 175 mg/m2), Intravenous,  Once, 0 of 6 cycles Dose modification: 140 mg/m2 (80 % of original dose 175 mg/m2, Cycle 1, Reason: Dose Not Tolerated) fosaprepitant (EMEND) 150 mg, dexamethasone (DECADRON) 12 mg in sodium chloride 0.9 % 145 mL IVPB, , Intravenous,  Once, 0 of 6 cycles  for chemotherapy treatment.  REVIEW OF SYSTEMS:   Constitutional: Denies fevers, chills or abnormal weight loss Eyes: Denies blurriness of vision Ears, nose, mouth, throat, and face: Denies mucositis or sore throat Respiratory: Denies cough, dyspnea or wheezes Cardiovascular: Denies palpitation, chest discomfort or lower extremity swelling Gastrointestinal:  Denies nausea, heartburn or change in bowel habits Skin: Denies abnormal skin rashes Lymphatics: Denies new lymphadenopathy or easy bruising Neurological:Denies numbness, tingling or new weaknesses Behavioral/Psych: Mood is stable, no new changes  All other systems were reviewed  with the patient and are negative.  I have reviewed the past medical history, past surgical history, social history and family history with the patient and they are unchanged from previous note.  ALLERGIES:  has No Known Allergies.  MEDICATIONS:  Current Outpatient Medications  Medication Sig Dispense Refill  . clopidogrel (PLAVIX) 75 MG tablet Take 1 tablet (75 mg total) by mouth daily with breakfast. 30 tablet 11  . cycloSPORINE (RESTASIS) 0.05 % ophthalmic emulsion Place 1 drop into both eyes 2 (two) times daily.    Marland Kitchen dexamethasone (DECADRON) 4 MG tablet Take 2 tabs at the night before and 2 tabs the morning of chemotherapy, every 3 weeks, by mouth 24 tablet 0  . lidocaine-prilocaine (EMLA) cream Apply to affected area once 30 g 3  . morphine (MS CONTIN) 30 MG 12 hr tablet Take 1 tablet (30 mg total) by mouth every 12 (twelve) hours. 60 tablet 0  . morphine (MSIR) 15 MG tablet Take 1 tablet (15 mg total) by mouth every 6 (six) hours as needed for severe pain. 60 tablet 0  . Olopatadine HCl 0.2 % SOLN Place 1 drop into both eyes daily.    . ondansetron (ZOFRAN) 8 MG tablet Take 1 tablet (8 mg total) by mouth every 8 (eight) hours as needed. 30 tablet 1  . polyethylene glycol (MIRALAX / GLYCOLAX) 17 g packet Take 17 g by mouth daily. 30 each 11  . prochlorperazine (COMPAZINE) 10 MG tablet Take 1 tablet (10 mg total) by mouth every 6 (six) hours as needed (Nausea or vomiting). 30 tablet 1  . rivaroxaban (XARELTO) 20 MG TABS tablet Take 20 mg by mouth daily with breakfast.     No current facility-administered medications for this visit.     PHYSICAL EXAMINATION: ECOG PERFORMANCE STATUS: 2 - Symptomatic, <50% confined to bed  Vitals:   07/07/18 1128  BP: 126/61  Pulse: 96  Resp: 20  SpO2: 100%   There were no vitals filed for this visit.  GENERAL:alert, no distress and comfortable SKIN: skin color, texture, turgor are normal, no rashes or significant lesions EYES: normal,  Conjunctiva are pink and non-injected, sclera clear OROPHARYNX:no exudate, no erythema and lips, buccal mucosa, and tongue normal  NECK: supple, thyroid normal size, non-tender, without nodularity LYMPH:  no palpable lymphadenopathy in the cervical, axillary or inguinal LUNGS: clear to auscultation and percussion with normal breathing effort HEART: regular rate & rhythm and no murmurs and no lower extremity edema ABDOMEN:abdomen soft, non-tender and normal bowel sounds Musculoskeletal:no cyanosis of digits and no clubbing  NEURO: alert & oriented x 3 with fluent speech, no focal motor/sensory deficits  LABORATORY DATA:  I have reviewed the data as listed    Component Value Date/Time   NA 137 06/29/2018 1325   K 3.5 06/29/2018 1325   CL 102 06/29/2018 1325   CO2 24 06/29/2018 1325   GLUCOSE 114 (H) 06/29/2018 1325   BUN 11 06/29/2018 1325   CREATININE 0.61 06/29/2018 1325  CALCIUM 9.3 06/29/2018 1325   PROT 8.5 (H) 06/29/2018 1325   ALBUMIN 3.6 06/29/2018 1325   AST 22 06/29/2018 1325   ALT 17 06/29/2018 1325   ALKPHOS 89 06/29/2018 1325   BILITOT 0.7 06/29/2018 1325   GFRNONAA >60 06/29/2018 1325   GFRAA >60 06/29/2018 1325    No results found for: SPEP, UPEP  Lab Results  Component Value Date   WBC 6.0 06/29/2018   NEUTROABS 2.4 01/18/2018   HGB 10.4 (L) 06/29/2018   HCT 33.5 (L) 06/29/2018   MCV 87.5 06/29/2018   PLT 311 06/29/2018      Chemistry      Component Value Date/Time   NA 137 06/29/2018 1325   K 3.5 06/29/2018 1325   CL 102 06/29/2018 1325   CO2 24 06/29/2018 1325   BUN 11 06/29/2018 1325   CREATININE 0.61 06/29/2018 1325      Component Value Date/Time   CALCIUM 9.3 06/29/2018 1325   ALKPHOS 89 06/29/2018 1325   AST 22 06/29/2018 1325   ALT 17 06/29/2018 1325   BILITOT 0.7 06/29/2018 1325       RADIOGRAPHIC STUDIES: I have personally reviewed the radiological images as listed and agreed with the findings in the report. Nm Pet Image  Initial (pi) Skull Base To Thigh  Result Date: 07/02/2018 CLINICAL DATA:  Initial treatment strategy for uterine carcinoma. EXAM: NUCLEAR MEDICINE PET SKULL BASE TO THIGH TECHNIQUE: 9.4 mCi F-18 FDG was injected intravenously. Full-ring PET imaging was performed from the skull base to thigh after the radiotracer. CT data was obtained and used for attenuation correction and anatomic localization. Fasting blood glucose: 97 mg/dl COMPARISON:  AP CT on 06/01/2018 FINDINGS: Mediastinal blood-pool activity (reference): SUV max = 2.4 Liver activity (reference): SUV max = N/A NECK:  No hypermetabolic lymph nodes or masses. Incidental CT findings:  None. CHEST: No hypermetabolic masses or lymphadenopathy. No suspicious pulmonary nodules seen on CT images. Incidental CT findings:  None. ABDOMEN/PELVIS: No abnormal hypermetabolic activity within the liver, pancreas, adrenal glands, or spleen. No hypermetabolic lymph nodes within the abdomen. Stent seen in the left common iliac and proximal external iliac veins. Large left pelvic wall soft tissue mass is seen which encases this stent, and involves the left acetabulum. This measures 9.3 x 6.4 cm on image 136/4, mildly increased from 8.5 by 5.8 cm previously. This has SUV max of 17.4. Prior hysterectomy. Asymmetric focal FDG uptake is seen in the left vaginal cuff with SUV max of 7.9 mm. Incidental CT findings:  None. SKELETON: No focal hypermetabolic bone lesions to suggest skeletal metastasis. Incidental CT findings:  None. IMPRESSION: Previous hysterectomy, with asymmetric focus of hypermetabolic activity in the left vaginal cuff, suspicious for residual or recurrent carcinoma. Large hypermetabolic soft tissue mass involving the left pelvic sidewall and acetabulum, consistent with metastatic disease. No evidence metastatic disease within the abdomen, chest, or neck. Electronically Signed   By: Earle Gell M.D.   On: 07/02/2018 15:15   Ir Imaging Guided Port  Insertion  Result Date: 06/29/2018 INDICATION: 73 year old with uterine cancer.  Port-A-Cath needed for treatment. EXAM: FLUOROSCOPIC AND ULTRASOUND GUIDED PLACEMENT OF A SUBCUTANEOUS PORT COMPARISON:  None. MEDICATIONS: Ancef 2 g; The antibiotic was administered within an appropriate time interval prior to skin puncture. ANESTHESIA/SEDATION: Versed 4.0 mg IV; Fentanyl 200 mcg IV; Moderate Sedation Time:  58 minutes The patient was continuously monitored during the procedure by the interventional radiology nurse under my direct supervision. FLUOROSCOPY TIME:  36 seconds, 16  mGy COMPLICATIONS: None immediate. PROCEDURE: The procedure, risks, benefits, and alternatives were explained to the patient. Questions regarding the procedure were encouraged and answered. The patient understands and consents to the procedure. Patient was placed supine on the interventional table. Ultrasound confirmed a patent right internal jugular vein. The right chest and neck were cleaned with a skin antiseptic and a sterile drape was placed. Maximal barrier sterile technique was utilized including caps, mask, sterile gowns, sterile gloves, sterile drape, hand hygiene and skin antiseptic. The right neck was anesthetized with 1% lidocaine. Small incision was made in the right neck with a blade. Micropuncture set was placed in the right internal jugular vein with ultrasound guidance. The micropuncture wire was used for measurement purposes. The right chest was anesthetized with 1% lidocaine with epinephrine. #15 blade was used to make an incision and a subcutaneous port pocket was formed. Crocker was assembled. Subcutaneous tunnel was formed with a stiff tunneling device. The port catheter was brought through the subcutaneous tunnel. The port was placed in the subcutaneous pocket and sutured in place. The micropuncture set was exchanged for a peel-away sheath. The catheter was placed through the peel-away sheath and the tip was  positioned at the SVC and right atrium junction. Catheter placement was confirmed with fluoroscopy. The port was accessed and flushed with heparinized saline. The port pocket was closed using two layers of absorbable sutures and Dermabond. The vein skin site was closed using a single layer of absorbable suture and Dermabond. Sterile dressings were applied. Patient tolerated the procedure well without an immediate complication. Ultrasound and fluoroscopic images were taken and saved for this procedure. IMPRESSION: Placement of a subcutaneous port device. Catheter tip at the SVC and right atrium junction. Electronically Signed   By: Markus Daft M.D.   On: 06/29/2018 15:59    All questions were answered. The patient knows to call the clinic with any problems, questions or concerns. No barriers to learning was detected.  I spent 25 minutes counseling the patient face to face. The total time spent in the appointment was 30 minutes and more than 50% was on counseling and review of test results  [name], MD 07/08/2018 12:48 PM

## 2018-07-08 NOTE — Telephone Encounter (Signed)
Scheduled appts per sch msg. Called son, darryl, confirmed dates and times

## 2018-07-08 NOTE — Assessment & Plan Note (Signed)
We discussed the importance of laxative therapy 

## 2018-07-09 ENCOUNTER — Inpatient Hospital Stay: Payer: Medicare Other

## 2018-07-09 ENCOUNTER — Ambulatory Visit
Admission: RE | Admit: 2018-07-09 | Discharge: 2018-07-09 | Disposition: A | Discharge status: Home / Self Care | Payer: Medicare Other | Source: Ambulatory Visit | Attending: Radiation Oncology | Admitting: Radiation Oncology

## 2018-07-09 ENCOUNTER — Other Ambulatory Visit: Payer: Self-pay

## 2018-07-09 ENCOUNTER — Other Ambulatory Visit: Payer: Self-pay | Admitting: Hematology and Oncology

## 2018-07-09 ENCOUNTER — Telehealth: Payer: Self-pay | Admitting: Oncology

## 2018-07-09 DIAGNOSIS — C541 Malignant neoplasm of endometrium: Secondary | ICD-10-CM | POA: Diagnosis not present

## 2018-07-09 DIAGNOSIS — K5909 Other constipation: Secondary | ICD-10-CM

## 2018-07-09 DIAGNOSIS — C55 Malignant neoplasm of uterus, part unspecified: Secondary | ICD-10-CM

## 2018-07-09 DIAGNOSIS — Z510 Encounter for antineoplastic radiation therapy: Secondary | ICD-10-CM | POA: Diagnosis not present

## 2018-07-09 DIAGNOSIS — G893 Neoplasm related pain (acute) (chronic): Secondary | ICD-10-CM

## 2018-07-09 LAB — CBC WITH DIFFERENTIAL/PLATELET
Abs Immature Granulocytes: 0.02 10*3/uL (ref 0.00–0.07)
Basophils Absolute: 0 10*3/uL (ref 0.0–0.1)
Basophils Relative: 0 %
Eosinophils Absolute: 0.1 10*3/uL (ref 0.0–0.5)
Eosinophils Relative: 3 %
HCT: 30.4 % — ABNORMAL LOW (ref 36.0–46.0)
Hemoglobin: 9.2 g/dL — ABNORMAL LOW (ref 12.0–15.0)
Immature Granulocytes: 1 %
Lymphocytes Relative: 18 %
Lymphs Abs: 0.6 10*3/uL — ABNORMAL LOW (ref 0.7–4.0)
MCH: 26.6 pg (ref 26.0–34.0)
MCHC: 30.3 g/dL (ref 30.0–36.0)
MCV: 87.9 fL (ref 80.0–100.0)
Monocytes Absolute: 0.4 10*3/uL (ref 0.1–1.0)
Monocytes Relative: 11 %
Neutro Abs: 2.1 10*3/uL (ref 1.7–7.7)
Neutrophils Relative %: 67 %
Platelets: 340 10*3/uL (ref 150–400)
RBC: 3.46 MIL/uL — ABNORMAL LOW (ref 3.87–5.11)
RDW: 14.5 % (ref 11.5–15.5)
WBC: 3.1 10*3/uL — ABNORMAL LOW (ref 4.0–10.5)
nRBC: 0 % (ref 0.0–0.2)

## 2018-07-09 LAB — COMPREHENSIVE METABOLIC PANEL
ALT: 13 U/L (ref 0–44)
AST: 15 U/L (ref 15–41)
Albumin: 3.1 g/dL — ABNORMAL LOW (ref 3.5–5.0)
Alkaline Phosphatase: 86 U/L (ref 38–126)
Anion gap: 10 (ref 5–15)
BUN: 9 mg/dL (ref 8–23)
CO2: 24 mmol/L (ref 22–32)
Calcium: 9.1 mg/dL (ref 8.9–10.3)
Chloride: 103 mmol/L (ref 98–111)
Creatinine, Ser: 0.68 mg/dL (ref 0.44–1.00)
GFR calc Af Amer: >60 mL/min (ref >60)
GFR calc non Af Amer: >60 mL/min (ref >60)
Glucose, Bld: 97 mg/dL (ref 70–99)
Potassium: 3.4 mmol/L — ABNORMAL LOW (ref 3.5–5.1)
Sodium: 137 mmol/L (ref 135–145)
Total Bilirubin: 0.4 mg/dL (ref 0.3–1.2)
Total Protein: 7.6 g/dL (ref 6.5–8.1)

## 2018-07-09 MED ORDER — HEPARIN SOD (PORK) LOCK FLUSH 100 UNIT/ML IV SOLN
500.0000 [IU] | Freq: Once | INTRAVENOUS | Status: AC
  Administered 2018-07-09: 500 [IU]
  Filled 2018-07-09: qty 5

## 2018-07-09 MED ORDER — SODIUM CHLORIDE 0.9% FLUSH
10.0000 mL | Freq: Once | INTRAVENOUS | Status: AC
  Administered 2018-07-09: 10 mL
  Filled 2018-07-09: qty 10

## 2018-07-09 NOTE — Telephone Encounter (Signed)
Called [name] and arranged for lab apt at 12 pm after radiation today.  Also called her son [name] and advised him of the lab appointment today and also that chemotherapy has been rescheduled for 8:30 tomorrow morning (arrival at 8 am) and that radiation will now be at 1:50 pm.  He verbalized understanding and agreement.

## 2018-07-10 ENCOUNTER — Ambulatory Visit
Admission: RE | Admit: 2018-07-10 | Discharge: 2018-07-10 | Disposition: A | Discharge status: Home / Self Care | Payer: Medicare Other | Source: Ambulatory Visit | Attending: Radiation Oncology | Admitting: Radiation Oncology

## 2018-07-10 ENCOUNTER — Inpatient Hospital Stay: Payer: Medicare Other

## 2018-07-10 ENCOUNTER — Ambulatory Visit: Payer: Medicare Other

## 2018-07-10 ENCOUNTER — Other Ambulatory Visit: Payer: Self-pay

## 2018-07-10 VITALS — BP 116/67 | HR 74 | Temp 98.9°F | Resp 19 | Wt 227.2 lb

## 2018-07-10 DIAGNOSIS — Z510 Encounter for antineoplastic radiation therapy: Secondary | ICD-10-CM | POA: Diagnosis not present

## 2018-07-10 DIAGNOSIS — C55 Malignant neoplasm of uterus, part unspecified: Secondary | ICD-10-CM

## 2018-07-10 DIAGNOSIS — Z7189 Other specified counseling: Secondary | ICD-10-CM

## 2018-07-10 DIAGNOSIS — C774 Secondary and unspecified malignant neoplasm of inguinal and lower limb lymph nodes: Secondary | ICD-10-CM

## 2018-07-10 DIAGNOSIS — C7951 Secondary malignant neoplasm of bone: Secondary | ICD-10-CM

## 2018-07-10 DIAGNOSIS — C541 Malignant neoplasm of endometrium: Secondary | ICD-10-CM | POA: Diagnosis not present

## 2018-07-10 LAB — CA 125: Cancer Antigen (CA) 125: 9 U/mL (ref 0.0–38.1)

## 2018-07-10 MED ORDER — COLD PACK MISC ONCOLOGY
1.0000 | Freq: Once | Status: DC | PRN
  Filled 2018-07-10: qty 1

## 2018-07-10 MED ORDER — PALONOSETRON HCL INJECTION 0.25 MG/5ML
0.2500 mg | Freq: Once | INTRAVENOUS | Status: AC
  Administered 2018-07-10: 0.25 mg via INTRAVENOUS

## 2018-07-10 MED ORDER — FAMOTIDINE IN NACL 20-0.9 MG/50ML-% IV SOLN
INTRAVENOUS | Status: AC
  Filled 2018-07-10: qty 50

## 2018-07-10 MED ORDER — HEPARIN SOD (PORK) LOCK FLUSH 100 UNIT/ML IV SOLN
500.0000 [IU] | Freq: Once | INTRAVENOUS | Status: AC | PRN
  Administered 2018-07-10: 15:00:00 500 [IU]
  Filled 2018-07-10: qty 5

## 2018-07-10 MED ORDER — SODIUM CHLORIDE 0.9% FLUSH
10.0000 mL | INTRAVENOUS | Status: DC | PRN
  Administered 2018-07-10: 10 mL
  Filled 2018-07-10: qty 10

## 2018-07-10 MED ORDER — SODIUM CHLORIDE 0.9 % IV SOLN
Freq: Once | INTRAVENOUS | Status: AC
  Administered 2018-07-10: 09:00:00 via INTRAVENOUS
  Filled 2018-07-10: qty 250

## 2018-07-10 MED ORDER — SODIUM CHLORIDE 0.9 % IV SOLN
140.0000 mg/m2 | Freq: Once | INTRAVENOUS | Status: AC
  Administered 2018-07-10: 276 mg via INTRAVENOUS
  Filled 2018-07-10: qty 46

## 2018-07-10 MED ORDER — PALONOSETRON HCL INJECTION 0.25 MG/5ML
INTRAVENOUS | Status: AC
  Filled 2018-07-10: qty 5

## 2018-07-10 MED ORDER — DIPHENHYDRAMINE HCL 50 MG/ML IJ SOLN
INTRAMUSCULAR | Status: AC
  Filled 2018-07-10: qty 1

## 2018-07-10 MED ORDER — FAMOTIDINE IN NACL 20-0.9 MG/50ML-% IV SOLN
20.0000 mg | Freq: Once | INTRAVENOUS | Status: AC
  Administered 2018-07-10: 09:00:00 20 mg via INTRAVENOUS

## 2018-07-10 MED ORDER — DIPHENHYDRAMINE HCL 50 MG/ML IJ SOLN
50.0000 mg | Freq: Once | INTRAMUSCULAR | Status: AC
  Administered 2018-07-10: 09:00:00 50 mg via INTRAVENOUS

## 2018-07-10 MED ORDER — SODIUM CHLORIDE 0.9 % IV SOLN
482.5000 mg | Freq: Once | INTRAVENOUS | Status: AC
  Administered 2018-07-10: 480 mg via INTRAVENOUS
  Filled 2018-07-10: qty 48

## 2018-07-10 MED ORDER — SODIUM CHLORIDE 0.9 % IV SOLN
Freq: Once | INTRAVENOUS | Status: AC
  Administered 2018-07-10: 10:00:00 via INTRAVENOUS
  Filled 2018-07-10: qty 5

## 2018-07-10 NOTE — Progress Notes (Signed)
Saw pt in infusion room & asked if she had any questions or concerns with her chemo education.  She expressed that she did not.

## 2018-07-10 NOTE — Patient Instructions (Signed)
[facility] Discharge Instructions for Patients Receiving Chemotherapy  Today you received the following chemotherapy agents Taxol, Carboplatin  To help prevent nausea and vomiting after your treatment, we encourage you to take your nausea medication: As directed by your MD   If you develop nausea and vomiting that is not controlled by your nausea medication, call the clinic.   BELOW ARE SYMPTOMS THAT SHOULD BE REPORTED IMMEDIATELY:  *FEVER GREATER THAN 100.5 F  *CHILLS WITH OR WITHOUT FEVER  NAUSEA AND VOMITING THAT IS NOT CONTROLLED WITH YOUR NAUSEA MEDICATION  *UNUSUAL SHORTNESS OF BREATH  *UNUSUAL BRUISING OR BLEEDING  TENDERNESS IN MOUTH AND THROAT WITH OR WITHOUT PRESENCE OF ULCERS  *URINARY PROBLEMS  *BOWEL PROBLEMS  UNUSUAL RASH Items with * indicate a potential emergency and should be followed up as soon as possible.  Feel free to call the clinic should you have any questions or concerns. The clinic phone number is [phone].  Please show the [place] at check-in to the Emergency Department and triage nurse.  Coronavirus (COVID-19) Are you at risk?  Are you at risk for the Coronavirus (COVID-19)?  To be considered HIGH RISK for Coronavirus (COVID-19), you have to meet the following criteria:  . Traveled to [place], [place], [place], [place] or [place]; or in the [place] to [place], [place], [place], or [place]; and have fever, cough, and shortness of breath within the last 2 weeks of travel OR . Been in close contact with a person diagnosed with COVID-19 within the last 2 weeks and have fever, cough, and shortness of breath . IF YOU DO NOT MEET THESE CRITERIA, YOU ARE CONSIDERED LOW RISK FOR COVID-19.  What to do if you are HIGH RISK for COVID-19?  Marland Kitchen If you are having a medical emergency, call 911. . Seek medical care right away. Before you go to a doctor's office, urgent care or emergency department, call ahead and tell  them about your recent travel, contact with someone diagnosed with COVID-19, and your symptoms. You should receive instructions from your physician's office regarding next steps of care.  . When you arrive at healthcare provider, tell the healthcare staff immediately you have returned from visiting [place], [place], [place], [place] or [place]; or traveled in the [place] to [place], [place], [place], or [place]; in the last two weeks or you have been in close contact with a person diagnosed with COVID-19 in the last 2 weeks.   . Tell the health care staff about your symptoms: fever, cough and shortness of breath. . After you have been seen by a medical provider, you will be either: o Tested for (COVID-19) and discharged home on quarantine except to seek medical care if symptoms worsen, and asked to  - Stay home and avoid contact with others until you get your results (4-5 days)  - Avoid travel on public transportation if possible (such as bus, train, or airplane) or o Sent to the Emergency Department by EMS for evaluation, COVID-19 testing, and possible admission depending on your condition and test results.  What to do if you are LOW RISK for COVID-19?  Reduce your risk of any infection by using the same precautions used for avoiding the common cold or flu:  Marland Kitchen Wash your hands often with soap and warm water for at least 20 seconds.  If soap and water are not readily available, use an alcohol-based hand sanitizer with at least 60% alcohol.  Marland Kitchen  If coughing or sneezing, cover your mouth and nose by coughing or sneezing into the elbow areas of your shirt or coat, into a tissue or into your sleeve (not your hands). . Avoid shaking hands with others and consider head nods or verbal greetings only. . Avoid touching your eyes, nose, or mouth with unwashed hands.  . Avoid close contact with people who are sick. . Avoid places or events with large numbers of people in one location, like concerts or  sporting events. . Carefully consider travel plans you have or are making. . If you are planning any travel outside or inside the [place], visit the CDC's Travelers' Health webpage for the latest health notices. . If you have some symptoms but not all symptoms, continue to monitor at home and seek medical attention if your symptoms worsen. . If you are having a medical emergency, call 911.   [place] / e-Visit: [email or web address]         [facility]: [facility]: [phone]                   [facility]: [phone]

## 2018-07-14 ENCOUNTER — Inpatient Hospital Stay (HOSPITAL_BASED_OUTPATIENT_CLINIC_OR_DEPARTMENT_OTHER): Payer: Medicare Other | Admitting: Hematology and Oncology

## 2018-07-14 ENCOUNTER — Other Ambulatory Visit: Payer: Self-pay

## 2018-07-14 ENCOUNTER — Ambulatory Visit
Admission: RE | Admit: 2018-07-14 | Discharge: 2018-07-14 | Disposition: A | Discharge status: Home / Self Care | Payer: Medicare Other | Source: Ambulatory Visit | Attending: Radiation Oncology | Admitting: Radiation Oncology

## 2018-07-14 ENCOUNTER — Telehealth: Payer: Self-pay

## 2018-07-14 DIAGNOSIS — K5909 Other constipation: Secondary | ICD-10-CM

## 2018-07-14 DIAGNOSIS — C774 Secondary and unspecified malignant neoplasm of inguinal and lower limb lymph nodes: Secondary | ICD-10-CM

## 2018-07-14 DIAGNOSIS — C7951 Secondary malignant neoplasm of bone: Secondary | ICD-10-CM | POA: Diagnosis not present

## 2018-07-14 DIAGNOSIS — G893 Neoplasm related pain (acute) (chronic): Secondary | ICD-10-CM

## 2018-07-14 DIAGNOSIS — Z5111 Encounter for antineoplastic chemotherapy: Secondary | ICD-10-CM | POA: Diagnosis not present

## 2018-07-14 DIAGNOSIS — Z7901 Long term (current) use of anticoagulants: Secondary | ICD-10-CM

## 2018-07-14 DIAGNOSIS — I825Z2 Chronic embolism and thrombosis of unspecified deep veins of left distal lower extremity: Secondary | ICD-10-CM

## 2018-07-14 DIAGNOSIS — Z510 Encounter for antineoplastic radiation therapy: Secondary | ICD-10-CM | POA: Diagnosis not present

## 2018-07-14 DIAGNOSIS — C541 Malignant neoplasm of endometrium: Secondary | ICD-10-CM

## 2018-07-14 DIAGNOSIS — C55 Malignant neoplasm of uterus, part unspecified: Secondary | ICD-10-CM

## 2018-07-14 DIAGNOSIS — Z79899 Other long term (current) drug therapy: Secondary | ICD-10-CM

## 2018-07-14 DIAGNOSIS — I872 Venous insufficiency (chronic) (peripheral): Secondary | ICD-10-CM

## 2018-07-14 DIAGNOSIS — R590 Localized enlarged lymph nodes: Secondary | ICD-10-CM

## 2018-07-14 NOTE — Telephone Encounter (Signed)
In office today for office visit with Dr. Alvy Bimler. First treatment 5/22.

## 2018-07-14 NOTE — Telephone Encounter (Signed)
-----   Message from Zola Button, RN sent at 07/10/2018  3:13 PM EDT ----- Regarding: Dr. Alvy Bimler; First time F/U Call Pt received first time Taxol/Carboplatin on 07/10/18. Tolerated well. Thank you!

## 2018-07-15 ENCOUNTER — Inpatient Hospital Stay: Payer: Medicare Other

## 2018-07-15 ENCOUNTER — Telehealth: Payer: Self-pay | Admitting: Hematology and Oncology

## 2018-07-15 ENCOUNTER — Other Ambulatory Visit: Payer: Self-pay

## 2018-07-15 ENCOUNTER — Other Ambulatory Visit: Payer: Self-pay | Admitting: Vascular Surgery

## 2018-07-15 ENCOUNTER — Ambulatory Visit (HOSPITAL_COMMUNITY)
Admission: RE | Admit: 2018-07-15 | Discharge: 2018-07-15 | Disposition: A | Discharge status: Home / Self Care | Payer: Medicare Other | Source: Ambulatory Visit | Attending: Medical | Admitting: Medical

## 2018-07-15 ENCOUNTER — Telehealth: Payer: Self-pay | Admitting: *Deleted

## 2018-07-15 ENCOUNTER — Encounter: Payer: Self-pay | Admitting: Hematology and Oncology

## 2018-07-15 ENCOUNTER — Inpatient Hospital Stay (HOSPITAL_BASED_OUTPATIENT_CLINIC_OR_DEPARTMENT_OTHER): Payer: Medicare Other | Admitting: Medical

## 2018-07-15 ENCOUNTER — Ambulatory Visit: Payer: Medicare Other

## 2018-07-15 VITALS — BP 132/63 | HR 83 | Temp 98.5°F | Resp 18 | Ht 61.0 in | Wt 219.2 lb

## 2018-07-15 DIAGNOSIS — C774 Secondary and unspecified malignant neoplasm of inguinal and lower limb lymph nodes: Secondary | ICD-10-CM | POA: Diagnosis not present

## 2018-07-15 DIAGNOSIS — R1084 Generalized abdominal pain: Secondary | ICD-10-CM

## 2018-07-15 DIAGNOSIS — G893 Neoplasm related pain (acute) (chronic): Secondary | ICD-10-CM

## 2018-07-15 DIAGNOSIS — I825Z2 Chronic embolism and thrombosis of unspecified deep veins of left distal lower extremity: Secondary | ICD-10-CM

## 2018-07-15 DIAGNOSIS — E876 Hypokalemia: Secondary | ICD-10-CM

## 2018-07-15 DIAGNOSIS — C7951 Secondary malignant neoplasm of bone: Secondary | ICD-10-CM | POA: Diagnosis not present

## 2018-07-15 DIAGNOSIS — C541 Malignant neoplasm of endometrium: Secondary | ICD-10-CM | POA: Diagnosis not present

## 2018-07-15 DIAGNOSIS — R197 Diarrhea, unspecified: Secondary | ICD-10-CM

## 2018-07-15 DIAGNOSIS — C55 Malignant neoplasm of uterus, part unspecified: Secondary | ICD-10-CM

## 2018-07-15 DIAGNOSIS — R112 Nausea with vomiting, unspecified: Secondary | ICD-10-CM

## 2018-07-15 DIAGNOSIS — K5909 Other constipation: Secondary | ICD-10-CM

## 2018-07-15 DIAGNOSIS — Z7901 Long term (current) use of anticoagulants: Secondary | ICD-10-CM

## 2018-07-15 DIAGNOSIS — R590 Localized enlarged lymph nodes: Secondary | ICD-10-CM

## 2018-07-15 DIAGNOSIS — I872 Venous insufficiency (chronic) (peripheral): Secondary | ICD-10-CM

## 2018-07-15 DIAGNOSIS — Z5111 Encounter for antineoplastic chemotherapy: Secondary | ICD-10-CM

## 2018-07-15 DIAGNOSIS — Z79899 Other long term (current) drug therapy: Secondary | ICD-10-CM

## 2018-07-15 DIAGNOSIS — Z7189 Other specified counseling: Secondary | ICD-10-CM

## 2018-07-15 LAB — CBC WITH DIFFERENTIAL (CANCER CENTER ONLY)
Abs Immature Granulocytes: 0.04 10*3/uL (ref 0.00–0.07)
Basophils Absolute: 0 10*3/uL (ref 0.0–0.1)
Basophils Relative: 0 %
Eosinophils Absolute: 0 10*3/uL (ref 0.0–0.5)
Eosinophils Relative: 1 %
HCT: 29.5 % — ABNORMAL LOW (ref 36.0–46.0)
Hemoglobin: 9.4 g/dL — ABNORMAL LOW (ref 12.0–15.0)
Immature Granulocytes: 2 %
Lymphocytes Relative: 6 %
Lymphs Abs: 0.1 10*3/uL — ABNORMAL LOW (ref 0.7–4.0)
MCH: 26.9 pg (ref 26.0–34.0)
MCHC: 31.9 g/dL (ref 30.0–36.0)
MCV: 84.3 fL (ref 80.0–100.0)
Monocytes Absolute: 0.1 10*3/uL (ref 0.1–1.0)
Monocytes Relative: 2 %
Neutro Abs: 2 10*3/uL (ref 1.7–7.7)
Neutrophils Relative %: 89 %
Platelet Count: 238 10*3/uL (ref 150–400)
RBC: 3.5 MIL/uL — ABNORMAL LOW (ref 3.87–5.11)
RDW: 14.6 % (ref 11.5–15.5)
WBC Count: 2.3 10*3/uL — ABNORMAL LOW (ref 4.0–10.5)
nRBC: 0 % (ref 0.0–0.2)

## 2018-07-15 LAB — MAGNESIUM: Magnesium: 1.7 mg/dL (ref 1.7–2.4)

## 2018-07-15 LAB — CMP (CANCER CENTER ONLY)
ALT: 12 U/L (ref 0–44)
AST: 12 U/L — ABNORMAL LOW (ref 15–41)
Albumin: 3.2 g/dL — ABNORMAL LOW (ref 3.5–5.0)
Alkaline Phosphatase: 76 U/L (ref 38–126)
Anion gap: 9 (ref 5–15)
BUN: 9 mg/dL (ref 8–23)
CO2: 24 mmol/L (ref 22–32)
Calcium: 9 mg/dL (ref 8.9–10.3)
Chloride: 102 mmol/L (ref 98–111)
Creatinine: 0.65 mg/dL (ref 0.44–1.00)
GFR, Est AFR Am: >60 mL/min (ref >60)
GFR, Estimated: >60 mL/min (ref >60)
Glucose, Bld: 131 mg/dL — ABNORMAL HIGH (ref 70–99)
Potassium: 3.1 mmol/L — ABNORMAL LOW (ref 3.5–5.1)
Sodium: 135 mmol/L (ref 135–145)
Total Bilirubin: 0.6 mg/dL (ref 0.3–1.2)
Total Protein: 7.4 g/dL (ref 6.5–8.1)

## 2018-07-15 MED ORDER — DEXAMETHASONE SODIUM PHOSPHATE 10 MG/ML IJ SOLN
10.0000 mg | Freq: Once | INTRAMUSCULAR | Status: AC
  Administered 2018-07-15: 10 mg via INTRAVENOUS

## 2018-07-15 MED ORDER — POTASSIUM CHLORIDE CRYS ER 20 MEQ PO TBCR
EXTENDED_RELEASE_TABLET | ORAL | Status: AC
  Filled 2018-07-15: qty 2

## 2018-07-15 MED ORDER — SODIUM CHLORIDE 0.9 % IV SOLN
Freq: Once | INTRAVENOUS | Status: AC
  Administered 2018-07-15: 13:00:00 via INTRAVENOUS
  Filled 2018-07-15: qty 250

## 2018-07-15 MED ORDER — HEPARIN SOD (PORK) LOCK FLUSH 100 UNIT/ML IV SOLN
500.0000 [IU] | Freq: Once | INTRAVENOUS | Status: AC
  Administered 2018-07-15: 500 [IU]
  Filled 2018-07-15: qty 5

## 2018-07-15 MED ORDER — SODIUM CHLORIDE 0.9 % IV SOLN: 10.0000 mg | Freq: Once | INTRAVENOUS | Status: DC

## 2018-07-15 MED ORDER — DEXAMETHASONE SODIUM PHOSPHATE 10 MG/ML IJ SOLN
INTRAMUSCULAR | Status: AC
  Filled 2018-07-15: qty 1

## 2018-07-15 MED ORDER — POTASSIUM CHLORIDE CRYS ER 20 MEQ PO TBCR: 20.0000 meq | EXTENDED_RELEASE_TABLET | Freq: Every day | ORAL | 0 refills | Status: DC

## 2018-07-15 MED ORDER — SODIUM CHLORIDE 0.9% FLUSH
10.0000 mL | Freq: Once | INTRAVENOUS | Status: AC
  Administered 2018-07-15: 10 mL
  Filled 2018-07-15: qty 10

## 2018-07-15 MED ORDER — POTASSIUM CHLORIDE CRYS ER 20 MEQ PO TBCR
40.0000 meq | EXTENDED_RELEASE_TABLET | Freq: Once | ORAL | Status: AC
  Administered 2018-07-15: 40 meq via ORAL

## 2018-07-15 MED ORDER — MORPHINE SULFATE 4 MG/ML IJ SOLN
2.0000 mg | Freq: Once | INTRAMUSCULAR | Status: AC
  Administered 2018-07-15: 2 mg via INTRAVENOUS
  Filled 2018-07-15: qty 1

## 2018-07-15 MED ORDER — DIPHENOXYLATE-ATROPINE 2.5-0.025 MG PO TABS
1.0000 | ORAL_TABLET | Freq: Once | ORAL | Status: AC
  Administered 2018-07-15: 1 via ORAL

## 2018-07-15 MED ORDER — MORPHINE SULFATE (PF) 4 MG/ML IV SOLN
INTRAVENOUS | Status: AC
  Filled 2018-07-15: qty 1

## 2018-07-15 MED ORDER — DIPHENOXYLATE-ATROPINE 2.5-0.025 MG PO TABS
ORAL_TABLET | ORAL | Status: AC
  Filled 2018-07-15: qty 1

## 2018-07-15 NOTE — Assessment & Plan Note (Signed)
She tolerated chemotherapy very well with near complete control of her pain Her appetite is stable I will schedule cycle 2 and 3 chemotherapy She will continue radiation treatment I plan to repeat imaging study after cycle 3 of therapy Her son is updated

## 2018-07-15 NOTE — Telephone Encounter (Signed)
"  Marcelle Hepner Cantrelle son Marguerite Olea [phone]).  Mom tells me she was nauseated through the night.  Has vomited clear liquid twice this morning and her stomach hurts really bad like a knot.  No abdominal swelling or bloating.  Soft diarrhea stools started yesterday."  "Redith Drach Ellerman denies nausea or use of antiemetics with first treatment cycle.  Last BM on Saturday was soft or loose.  Sip from one 12.9 oz water bottle daily."  Advised to use Ondansetron and Compazine with no further questions.  Son will give ondansetron now and compazine before breakfast before granddaughter brings her today for 11:15 radiation.

## 2018-07-15 NOTE — Assessment & Plan Note (Signed)
She has excellent control of her pain When I see her next month, I will start weaning effort

## 2018-07-15 NOTE — Patient Instructions (Signed)

## 2018-07-15 NOTE — Telephone Encounter (Signed)
Son [name] called reporting "[name] continuing to throw up.  Tried to eat eggs she threw up.  Tried to drink water she threw up."  [name] moaning with conversation reports she "has taken both anti-emetics.  Vomited about a half a cup of yellow colored liquid.  Stomach hurts moving to my back when I ate."  Orders received for [facility] visit today per Dr. Alvy Bimler.  Notified patient and son.  Report granddaughter will get her dressed and leave home in about 15 minutes.   11:24 am "[name], we're here in [place] area.  She needs to use the bathroom." "Called Nurse Tech Desk to request assistance to bathroom for patient "      12:53 pm "[name] calling to find out any word about my mom."  Advised [facility] waiting for lab results.  Nurse reports they will return call when receive more information.

## 2018-07-15 NOTE — Telephone Encounter (Signed)
Called son regarding schedule

## 2018-07-15 NOTE — Progress Notes (Signed)
Pt presents with fatigue, nausea, and diarrhea, however she does not present with signs of C.Diff/Norovirus so not placed on enteric precautions.  Pt's IVF paused, pt taken to radiology for KUB by PA Lucianne Lei.  NPO currently besides taking antidiarrheal medication.  Restarted once back from radiology.  Since scan was negative for obstruction pt was given food and drink, tolerated well.  Received 1L IVF NS.  Reports feeling better at end of tx, denies any pain, nausea, or diarrhea.

## 2018-07-15 NOTE — Assessment & Plan Note (Signed)
She denies bleeding complications from Xarelto.  She will continue

## 2018-07-15 NOTE — Progress Notes (Signed)
[place] OFFICE PROGRESS NOTE  Patient Care Team: Nolene Ebbs, MD as PCP - General (Internal Medicine)  ASSESSMENT & PLAN:  Uterine cancer [facility]) She tolerated chemotherapy very well with near complete control of her pain Her appetite is stable I will schedule cycle 2 and 3 chemotherapy She will continue radiation treatment I plan to repeat imaging study after cycle 3 of therapy Her son is updated  Cancer associated pain She has excellent control of her pain When I see her next month, I will start weaning effort  Lower leg DVT (deep venous thromboembolism), chronic, left ([place]) She denies bleeding complications from Xarelto.  She will continue  Other constipation She denies recent constipation She will continue laxatives as needed.   No orders of the defined types were placed in this encounter.   INTERVAL HISTORY: Please see below for problem oriented charting. She returns for further follow-up Her pain control is excellent She rated her pain is near 0 today Her son is also available to collaborate her history She denies nausea or changes in bowel habits with recent chemo No recent bleeding Her appetite is stable.  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 Uterus +/- tubes/ovaries, neoplastic, with left fallopian tube and ovary - INVASIVE ENDOMETRIOID CARCINOMA (1.5 CM), FIGO GRADE II, ARISING IN A BACKGROUND OF ATYPICAL COMPLEX HYPERPLASIA, CONFINED WITHIN INNER HALF OF THE MYOMETRIUM. - ENDOMETRIAL POLYP WITH ASSOCIATED ATYPICAL COMPLEX HYPERPLASIA. - MYOMETRIUM: LEIOMYOMATA. - CERVIX: BENIGN SQUAMOUS MUCOSA AND ENDOCERVICAL MUCOSA, NO DYSPLASIA OR MALIGNANCY. - LEFT OVARY: BENIGN OVARIAN TISSUE WITH ENDOSALPINGOSIS, NO EVIDENCE OF ATYPIA OR MALIGNANCY. - LEFT  FALLOPIAN TUBE: NO HISTOLOGIC ABNORMALITIES. - PLEASE SEE ONCOLOGY TEMPLATE FOR DETAIL. 2. Ovary and fallopian tube, right - BENIGN OVARIAN TISSUE WITH ENDOSALPINGOSIS, NO ATYPIA OR MALIGNANCY. - BENIGN FALLOPIAN TUBAL TISSUE, NO PATHOLOGIC ABNORMALITIES. Microscopic Comment 1. UTERUS Specimen: Uterus, cervix, bilateral ovaries and fallopian tubes Procedure: Total hysterectomy and bilateral salpingo-oophorectomy Lymph node sampling performed: No Specimen integrity: Intact Maximum tumor size (cm): 1.5 cm, glass slide measurement Histologic type: Invasive endometrioid carcinoma Grade: FIGO grade II Myometrial invasion: 1 cm where myometrium is 2.3 cm in thickness Cervical stromal involvement: No Extent of involvement of other organs: No Lymph vascular invasion: Not identified Peritoneal washings: Negative ([id number]) Lymph nodes: number examined N/A; number positive N/A TNM code: pT1a, pNX 1 oFf 3IGO Stage (based on pathologic findings, needs clinical correlation): IA  Comments: Sections the endomyometrium away from the grossly identified endometrial polyp show an invasive FIGO grade II endometrioid carcinoma. The tumor is confined within inner half of the myometrium. No angiolymphatic invasion is identified. No cervical stromal involvement is identified. Sections of the grossly identified endometrial polyp show an endometrial polyp with associated atypical compacted hyperplasia with no definitive evidence of carcinoma.    12/07/2017 Imaging    US venous Doppler Right: No evidence of common femoral vein obstruction. Left: Findings consistent with acute deep vein thrombosis involving the left femoral vein, left proximal profunda vein, and left popliteal vein. Unable to adequately interrogate the common femoral and higher, or the calf secondary to significant edema and body habitus    12/07/2017 [facility] Admission    She presented to the ER and was diagnosed with acute DVT     01/18/2018 - 01/21/2018  Hospital Admission    She was admitted to the hospital for management of severe persistent DVT    01/18/2018 Imaging    US venous Doppler Right: No evidence of common femoral vein obstruction. Left: Findings consistent with acute deep vein thrombosis involving the left common femoral vein, and left popliteal vein.    01/19/2018 Surgery    Pre-operative Diagnosis: Subacute DVT with severe post thrombotic syndrome Post-operative diagnosis:  Same Surgeon:  Erlene Quan C. Donzetta Matters, MD Procedure Performed: 1.  Ultrasound-guided cannulation left small saphenous vein 2.  Left lower extremity and central venography 3.  Intravascular ultrasound of left popliteal, femoral, common femoral, external and common iliac veins and IVC 4.  Stent of left common and external iliac veins with 14 x 60 mm Vici 5.  Moderate sedation with fentanyl and Versed for 50 minutes  Indications: 73 year old female with a history of DVT in October now presents with persistent left lower extremity swelling and ultrasound demonstrating likely persistent DVT.  She has been on Xarelto at this time.  She is now indicated for venogram possible intervention.  Findings: Flow in the left lower extremity was stagnant throughout but by venogram all veins were patent.  There was a focal occlusive area approximately 2 cm in length at the common and external iliac vein junction at the hypogastric on the left.  After stenting and ballooning we had a diameter of 12 millimeters in the stent and venogram demonstrated flow in the lower extremity veins were previously was stagnant and no further residual stenosis in the left common and external iliac vein junction.    04/12/2018 Imaging    US Venous Doppler Right: No evidence of common femoral vein obstruction. Left: There is no evidence of deep vein thrombosis in the lower extremity. However, portions of this examination were limited- see technologist comments above. Left groin:  Large hypoechoic area with mixed echoes noted measuring nearly 10 cm. Possible  hematoma versus unknown etiology. Ultrasound characteristics of enlarged lymph nodes noted in the groin.       05/15/2018 Imaging    US Venous Doppler Right: No evidence of deep vein thrombosis in the lower extremity. No indirect evidence of obstruction proximal to the inguinal ligament. Left: No reflux was noted in the common femoral vein , femoral vein in the thigh, popliteal vein, great saphenous vein at the saphenofemoral junction, great saphenous vein at the proximal thigh, great saphenous vein at the mid thigh, great saphenous vein  at the distal thigh, great saphenous vein at the knee, origin of the small saphenous vein, proximal small saphenous vein, and mid small saphenous vein. There is no evidence of deep vein thrombosis in the lower extremity. There is no evidence of superficial venous thrombosis. No cystic structure found in the popliteal fossa. Unable to evaluate extension of common femoral vein obstruction proximal to the inguinal ligament.    06/01/2018 Imaging    1. 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 Additionally, there is lucency involving the medial sidewall of the left acetabulum with potential nondisplaced pathologic fracture. Further evaluation with contrast-enhanced pelvic MRI could be performed as clinically indicated. 2. The left pelvic arterial and venous system is encased by this infiltrative left pelvic sidewall mass however while difficult to ascertain, the left external iliac venous stent appears patent.    06/18/2018 Pathology Results    Lymph node for lymphoma, Left Inguinal - METASTATIC ADENOCARCINOMA, SEE COMMENT. Microscopic Comment Immunohistochemistry  is positive for cytokeratin 7, PAX8, ER, and PR. Cytokeratin 5/6,and p63 are negative. The immunoprofile along with the  patient's history are consistent with a gynecologic primary.    06/18/2018 Surgery    Pre-op Diagnosis: INGUINAL LYMPHADENOPATHY, PELVIC MASS     Procedure(s): EXCISIONAL BIOPSY DEEP LEFT INGUINAL LYMPH NODE  Surgeon(s): Coralie Keens, MD     06/24/2018 Cancer Staging    Staging form: Corpus Uteri - Carcinoma and Carcinosarcoma, AJCC 8th Edition - Clinical: Stage IVB (cT1a, cN2, pM1) - Signed by [name], MD on 06/24/2018     Genetic Testing    Patient has genetic testing done for MMR on pathology from 06/18/2018. Results revealed patient has the following mutation(s): MMR: abnormal    06/29/2018 Procedure    Placement of a subcutaneous port device. Catheter tip at the SVC and right atrium junction.     Genetic Testing    Patient has genetic testing done for MSI on pathology from 06/18/2018. Results revealed patient has the following mutation(s): MSI: High    07/02/2018 PET scan    Previous hysterectomy, with asymmetric focus of hypermetabolic activity in the left vaginal cuff, suspicious for residual or recurrent carcinoma.  Large hypermetabolic soft tissue mass involving the left pelvic sidewall and acetabulum, consistent with metastatic disease.  No evidence metastatic disease within the abdomen, chest, or neck.    07/09/2018 Tumor Marker    Patient's tumor was tested for the following markers: CA-125 Results of the tumor marker test revealed 9    07/10/2018 -  Chemotherapy    The patient had carboplatin and taxol     Metastasis to lymph nodes ([place])   06/23/2018 Initial Diagnosis    Metastasis to lymph nodes ([facility])    07/10/2018 -  Chemotherapy    The patient had palonosetron (ALOXI) injection 0.25 mg, 0.25 mg, Intravenous,  Once, 1 of 6 cycles Administration: 0.25 mg (07/10/2018) CARBOplatin (PARAPLATIN) 480 mg in sodium chloride 0.9 % 250 mL chemo infusion, 480 mg (100 % of original dose 482.5 mg), Intravenous,  Once, 1 of 6 cycles Dose modification: 482.5 mg  (original dose 482.5 mg, Cycle 1) Administration: 480 mg (07/10/2018) PACLitaxel (TAXOL) 276 mg in sodium chloride 0.9 % 250 mL chemo infusion (> 4m/m2), 140 mg/m2 = 276 mg (80 % of original dose 175 mg/m2), Intravenous,  Once, 1 of 6 cycles Dose modification: 140 mg/m2 (80 % of original dose 175 mg/m2, Cycle 1, Reason: Dose Not Tolerated) Administration: 276 mg (07/10/2018) fosaprepitant (EMEND) 150 mg, dexamethasone (DECADRON) 12 mg in sodium chloride 0.9 % 145 mL IVPB, , Intravenous,  Once, 1 of 6 cycles Administration:  (07/10/2018)  for chemotherapy treatment.      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 ([place]   06/24/2018 Initial Diagnosis    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 ([place]    07/10/2018 -  Chemotherapy    The patient had palonosetron (ALOXI) injection 0.25 mg, 0.25 mg, Intravenous,  Once, 1 of 6 cycles Administration: 0.25 mg (07/10/2018) CARBOplatin (PARAPLATIN) 480 mg in sodium chloride 0.9 % 250 mL chemo infusion, 480 mg (100 % of original dose 482.5 mg), Intravenous,  Once, 1 of 6 cycles Dose modification: 482.5 mg (original dose 482.5 mg, Cycle 1) Administration: 480 mg (07/10/2018) PACLitaxel (TAXOL) 276 mg in sodium chloride 0.9 % 250 mL chemo infusion (> 873mm2), 140 mg/m2 = 276 mg (80 % of original dose 175 mg/m2), Intravenous,  Once, 1 of 6 cycles Dose modification: 140 mg/m2 (80 % of original dose 175 mg/m2, Cycle 1, Reason:  Dose Not Tolerated) Administration: 276 mg (07/10/2018) fosaprepitant (EMEND) 150 mg, dexamethasone (DECADRON) 12 mg in sodium chloride 0.9 % 145 mL IVPB, , Intravenous,  Once, 1 of 6 cycles Administration:  (07/10/2018)  for chemotherapy treatment.      REVIEW OF SYSTEMS:   Constitutional: Denies fevers, chills or abnormal weight loss Eyes: Denies blurriness of vision Ears, nose, mouth, throat, and face: Denies mucositis or sore throat Respiratory: Denies cough, dyspnea or wheezes Cardiovascular: Denies palpitation, chest discomfort or lower extremity  swelling Gastrointestinal:  Denies nausea, heartburn or change in bowel habits Skin: Denies abnormal skin rashes Lymphatics: Denies new lymphadenopathy or easy bruising Neurological:Denies numbness, tingling or new weaknesses Behavioral/Psych: Mood is stable, no new changes  All other systems were reviewed with the patient and are negative.  I have reviewed the past medical history, past surgical history, social history and family history with the patient and they are unchanged from previous note.  ALLERGIES:  has No Known Allergies.  MEDICATIONS:  Current Outpatient Medications  Medication Sig Dispense Refill  . clopidogrel (PLAVIX) 75 MG tablet Take 1 tablet (75 mg total) by mouth daily with breakfast. 30 tablet 11  . cycloSPORINE (RESTASIS) 0.05 % ophthalmic emulsion Place 1 drop into both eyes 2 (two) times daily.    Marland Kitchen dexamethasone (DECADRON) 4 MG tablet Take 2 tabs at the night before and 2 tabs the morning of chemotherapy, every 3 weeks, by mouth 24 tablet 0  . lidocaine-prilocaine (EMLA) cream Apply to affected area once 30 g 3  . morphine (MS CONTIN) 30 MG 12 hr tablet Take 1 tablet (30 mg total) by mouth every 12 (twelve) hours. 60 tablet 0  . morphine (MSIR) 15 MG tablet Take 1 tablet (15 mg total) by mouth every 6 (six) hours as needed for severe pain. 60 tablet 0  . Olopatadine HCl 0.2 % SOLN Place 1 drop into both eyes daily.    . ondansetron (ZOFRAN) 8 MG tablet Take 1 tablet (8 mg total) by mouth every 8 (eight) hours as needed. 30 tablet 1  . polyethylene glycol (MIRALAX / GLYCOLAX) 17 g packet Take 17 g by mouth daily. 30 each 11  . prochlorperazine (COMPAZINE) 10 MG tablet Take 1 tablet (10 mg total) by mouth every 6 (six) hours as needed (Nausea or vomiting). 30 tablet 1  . rivaroxaban (XARELTO) 20 MG TABS tablet Take 20 mg by mouth daily with breakfast.     No current facility-administered medications for this visit.     PHYSICAL EXAMINATION: ECOG PERFORMANCE  STATUS: 0 - Asymptomatic  Vitals:   07/14/18 1240  BP: 126/64  Pulse: 96  Resp: 18  Temp: 98.9 F (37.2 C)  SpO2: 100%   Filed Weights   07/14/18 1240  Weight: 222 lb 6.4 oz (100.9 kg)    GENERAL:alert, no distress and comfortable HEART: she has persistent lower extremity edema ABDOMEN:abdomen soft, non-tender and normal bowel sounds Musculoskeletal:no cyanosis of digits and no clubbing  NEURO: alert & oriented x 3 with fluent speech, no focal motor/sensory deficits  LABORATORY DATA:  I have reviewed the data as listed    Component Value Date/Time   NA 137 07/09/2018 1241   K 3.4 (L) 07/09/2018 1241   CL 103 07/09/2018 1241   CO2 24 07/09/2018 1241   GLUCOSE 97 07/09/2018 1241   BUN 9 07/09/2018 1241   CREATININE 0.68 07/09/2018 1241   CALCIUM 9.1 07/09/2018 1241   PROT 7.6 07/09/2018 1241   ALBUMIN 3.1 (L)  07/09/2018 1241   AST 15 07/09/2018 1241   ALT 13 07/09/2018 1241   ALKPHOS 86 07/09/2018 1241   BILITOT 0.4 07/09/2018 1241   GFRNONAA >60 07/09/2018 1241   GFRAA >60 07/09/2018 1241    No results found for: SPEP, UPEP  Lab Results  Component Value Date   WBC 3.1 (L) 07/09/2018   NEUTROABS 2.1 07/09/2018   HGB 9.2 (L) 07/09/2018   HCT 30.4 (L) 07/09/2018   MCV 87.9 07/09/2018   PLT 340 07/09/2018      Chemistry      Component Value Date/Time   NA 137 07/09/2018 1241   K 3.4 (L) 07/09/2018 1241   CL 103 07/09/2018 1241   CO2 24 07/09/2018 1241   BUN 9 07/09/2018 1241   CREATININE 0.68 07/09/2018 1241      Component Value Date/Time   CALCIUM 9.1 07/09/2018 1241   ALKPHOS 86 07/09/2018 1241   AST 15 07/09/2018 1241   ALT 13 07/09/2018 1241   BILITOT 0.4 07/09/2018 1241       RADIOGRAPHIC STUDIES: I have personally reviewed the radiological images as listed and agreed with the findings in the report. Nm Pet Image Initial (pi) Skull Base To Thigh  Result Date: 07/02/2018 CLINICAL DATA:  Initial treatment strategy for uterine carcinoma.  EXAM: NUCLEAR MEDICINE PET SKULL BASE TO THIGH TECHNIQUE: 9.4 mCi F-18 FDG was injected intravenously. Full-ring PET imaging was performed from the skull base to thigh after the radiotracer. CT data was obtained and used for attenuation correction and anatomic localization. Fasting blood glucose: 97 mg/dl COMPARISON:  AP CT on 06/01/2018 FINDINGS: Mediastinal blood-pool activity (reference): SUV max = 2.4 Liver activity (reference): SUV max = N/A NECK:  No hypermetabolic lymph nodes or masses. Incidental CT findings:  None. CHEST: No hypermetabolic masses or lymphadenopathy. No suspicious pulmonary nodules seen on CT images. Incidental CT findings:  None. ABDOMEN/PELVIS: No abnormal hypermetabolic activity within the liver, pancreas, adrenal glands, or spleen. No hypermetabolic lymph nodes within the abdomen. Stent seen in the left common iliac and proximal external iliac veins. Large left pelvic wall soft tissue mass is seen which encases this stent, and involves the left acetabulum. This measures 9.3 x 6.4 cm on image 136/4, mildly increased from 8.5 by 5.8 cm previously. This has SUV max of 17.4. Prior hysterectomy. Asymmetric focal FDG uptake is seen in the left vaginal cuff with SUV max of 7.9 mm. Incidental CT findings:  None. SKELETON: No focal hypermetabolic bone lesions to suggest skeletal metastasis. Incidental CT findings:  None. IMPRESSION: Previous hysterectomy, with asymmetric focus of hypermetabolic activity in the left vaginal cuff, suspicious for residual or recurrent carcinoma. Large hypermetabolic soft tissue mass involving the left pelvic sidewall and acetabulum, consistent with metastatic disease. No evidence metastatic disease within the abdomen, chest, or neck. Electronically Signed   By: Earle Gell M.D.   On: 07/02/2018 15:15   Ir Imaging Guided Port Insertion  Result Date: 06/29/2018 INDICATION: 73 year old with uterine cancer.  Port-A-Cath needed for treatment. EXAM: FLUOROSCOPIC AND  ULTRASOUND GUIDED PLACEMENT OF A SUBCUTANEOUS PORT COMPARISON:  None. MEDICATIONS: Ancef 2 g; The antibiotic was administered within an appropriate time interval prior to skin puncture. ANESTHESIA/SEDATION: Versed 4.0 mg IV; Fentanyl 200 mcg IV; Moderate Sedation Time:  58 minutes The patient was continuously monitored during the procedure by the interventional radiology nurse under my direct supervision. FLUOROSCOPY TIME:  36 seconds, 16 mGy COMPLICATIONS: None immediate. PROCEDURE: The procedure, risks, benefits, and alternatives were explained  to the patient. Questions regarding the procedure were encouraged and answered. The patient understands and consents to the procedure. Patient was placed supine on the interventional table. Ultrasound confirmed a patent right internal jugular vein. The right chest and neck were cleaned with a skin antiseptic and a sterile drape was placed. Maximal barrier sterile technique was utilized including caps, mask, sterile gowns, sterile gloves, sterile drape, hand hygiene and skin antiseptic. The right neck was anesthetized with 1% lidocaine. Small incision was made in the right neck with a blade. Micropuncture set was placed in the right internal jugular vein with ultrasound guidance. The micropuncture wire was used for measurement purposes. The right chest was anesthetized with 1% lidocaine with epinephrine. #15 blade was used to make an incision and a subcutaneous port pocket was formed. [place] was assembled. Subcutaneous tunnel was formed with a stiff tunneling device. The port catheter was brought through the subcutaneous tunnel. The port was placed in the subcutaneous pocket and sutured in place. The micropuncture set was exchanged for a peel-away sheath. The catheter was placed through the peel-away sheath and the tip was positioned at the SVC and right atrium junction. Catheter placement was confirmed with fluoroscopy. The port was accessed and flushed with  heparinized saline. The port pocket was closed using two layers of absorbable sutures and Dermabond. The vein skin site was closed using a single layer of absorbable suture and Dermabond. Sterile dressings were applied. Patient tolerated the procedure well without an immediate complication. Ultrasound and fluoroscopic images were taken and saved for this procedure. IMPRESSION: Placement of a subcutaneous port device. Catheter tip at the SVC and right atrium junction. Electronically Signed   By: Markus Daft M.D.   On: 06/29/2018 15:59    All questions were answered. The patient knows to call the clinic with any problems, questions or concerns. No barriers to learning was detected.  I spent 15 minutes counseling the patient face to face. The total time spent in the appointment was 20 minutes and more than 50% was on counseling and review of test results  [name], MD 07/15/2018 7:44 AM

## 2018-07-15 NOTE — Assessment & Plan Note (Signed)
She denies recent constipation She will continue laxatives as needed.

## 2018-07-16 ENCOUNTER — Ambulatory Visit
Admission: RE | Admit: 2018-07-16 | Discharge: 2018-07-16 | Disposition: A | Discharge status: Home / Self Care | Payer: Medicare Other | Source: Ambulatory Visit | Attending: Radiation Oncology | Admitting: Radiation Oncology

## 2018-07-16 ENCOUNTER — Other Ambulatory Visit: Payer: Self-pay

## 2018-07-16 DIAGNOSIS — Z510 Encounter for antineoplastic radiation therapy: Secondary | ICD-10-CM | POA: Diagnosis not present

## 2018-07-16 NOTE — Progress Notes (Signed)
[facility] Progress Note   [name] [id number] 08/09/1945 73 y.o.  [name] is managed by Dr. [name]  Actively treated with chemotherapy/immunotherapy/hormonal therapy: yes  Current therapy:  carboplatin and paclitaxel with concurrent radiation therapy  Last treated: 07/10/2018 (cycle 1, day 1)  Next scheduled appointment with provider: 07/31/2018  Assessment: Plan:    Diarrhea, unspecified type - Plan: diphenoxylate-atropine (LOMOTIL) 2.5-0.025 MG per tablet 1 tablet, 0.9 %  sodium chloride infusion, DG Abd 2 Views, Magnesium, dexamethasone (DECADRON) injection 10 mg  Generalized abdominal pain - Plan: morphine 4 MG/ML injection 2 mg, 0.9 %  sodium chloride infusion, DG Abd 2 Views  Non-intractable vomiting with nausea, unspecified vomiting type - Plan: DG Abd 2 Views, DISCONTINUED: dexamethasone (DECADRON) 10 mg in sodium chloride 0.9 % 50 mL IVPB  Malignant neoplasm of uterus, unspecified site ([place]) - Plan: heparin lock flush 100 unit/mL, sodium chloride flush (NS) 0.9 % injection 10 mL  Hypokalemia - Plan: potassium chloride SA (K-DUR) CR tablet 40 mEq, potassium chloride SA (K-DUR) 20 MEQ tablet   Diarrhea and abdominal pain: A CBC, CMP, and magnesium were completed today. [name] was given morphine sulfate 2 mg IV x 1 with improvement in her abdominal pain. She was also given Lomotil 1 tablet PO x 1. She was referred for a KUB which returned showing:  Nonobstructive bowel gas pattern. Irregularity along the left pectineal line at the acetabulum, in the region of known tumor involvement and pathologic fracture. No displaced fracture identified. Vascular stent of the left iliac system.   Nausea and vomiting: [name] was given Decadron 10 mg IV x 1. She was able to eat soup and crackers and drink after her nausea and abdominal pain abated.   Malignant neoplasm of the uterus. [name] is status post cycle 1, day 1 of carboplatin and  paclitaxel which was dosed on 07/10/2018. She continues with concurrent radiation. She is scheduled to see Dr. Alvy Bimler in follow up on 07/31/2018.   Hypokalemia: Labs returned showing a potassium of 3.1. [name] was given oral KCL 40 meg PO x 1. She additional had a prescription for PO K-Dur 20 meg PO once daily x 7 days sent to her pharmacy. Labs will be rechecked on her return.   Please see After Visit Summary for patient specific instructions.  Future Appointments  Date Time Provider [place]  07/16/2018 11:15 AM [facility] LINAC 4 [facility] None  07/17/2018 11:15 AM [facility] LINAC 4 [facility] None  07/20/2018 11:15 AM [facility] LINAC 4 [facility] None  07/21/2018 11:15 AM [facility] LINAC 4 [facility] None  07/27/2018  2:30 PM [facility] 1 [facility]  07/31/2018  9:20 AM Waynetta Sandy, MD [facility]  07/31/2018 10:30 AM [facility] LAB 6 [facility] None  07/31/2018 10:45 AM [facility] [place] FLUSH [facility] None  07/31/2018 11:15 AM Alvy Bimler, Ni, MD [facility] None  07/31/2018 12:15 PM [facility] INFUSION [facility] None  08/24/2018  9:30 AM [facility] LAB 2 [facility] None  08/24/2018  9:45 AM [facility] [place] FLUSH [facility] None  08/24/2018 10:15 AM [name], MD [facility] None  08/24/2018 11:15 AM [facility] INFUSION [facility] None    Orders Placed This Encounter  Procedures  . DG Abd 2 Views  . Magnesium       Subjective:   Patient ID:  [name] is a 73 y.o. (DOB April 09, 1945) female.  Chief Complaint:  Chief Complaint  Patient presents with  . Diarrhea    HPI [name]  Jerilynn Mages Sypher  Is a 73 year old female with a recurrent endometrioid cancer who is managed by Dr. [name]. She is status post cycle 1, day 1 of carboplatin and paclitaxel which was dosed on 07/10/2018. She continues with concurrent radiation. She presents today with recent nausea and vomiting. Additionally, she has diarrhea and abdominal cramping today. She ate grits  thia morning. She denies fevers, chill, or sweats. She has taken Zofran and Compazine. She reports that she has a medication at home to use for diarrhea.  Medications: I have reviewed the patient's current medications.  Allergies: No Known Allergies  Past Medical History:  Diagnosis Date  . Acute upper respiratory infection 07/06/2014  . Anemia   . Arthritis    Back   . Colon polyp    Tubular Adenoma   . Cough productive of clear sputum 06/22/2014  . Family history of breast cancer   . GERD (gastroesophageal reflux disease)   . History of right bundle branch block (RBBB)   . HOH (hard of hearing)   . Hypertension    had in the past, is no longer on medication for this and blood pressures are WNL  . 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 Primary localized osteoarthritis of right knee   . Uterine cancer ([place]) 06/23/2018    Past Surgical History:  Procedure Laterality Date  . ABDOMINAL HYSTERECTOMY  2012  . CHOLECYSTECTOMY N/A 03/09/2013   Procedure: LAPAROSCOPIC CHOLECYSTECTOMY;  Surgeon: Gayland Curry, MD;  Location: [place];  Service: General;  Laterality: N/A;  . COLONOSCOPY W/ BIOPSIES    . IR IMAGING GUIDED PORT INSERTION  06/29/2018  . LARYNGOSCOPY Left 03/14/2017   Procedure: LARYNGOSCOPY;  Surgeon: Helayne Seminole, MD;  Location: [place];  Service: ENT;  Laterality: Left;  . LOWER EXTREMITY VENOGRAPHY Left 01/19/2018   Procedure: LOWER EXTREMITY VENOGRAPHY;  Surgeon: Waynetta Sandy, MD;  Location: [place] [facility];  Service: Cardiovascular;  Laterality: Left;  . LYMPH NODE BIOPSY Left 06/18/2018   Procedure: EXCISIONAL BIOPSY LEFT INGUINAL LYMPH NODE;  Surgeon: Coralie Keens, MD;  Location: [place];  Service: General;  Laterality: Left;  . PERIPHERAL VASCULAR INTERVENTION Left 01/19/2018   Procedure: PERIPHERAL VASCULAR INTERVENTION;  Surgeon: Waynetta Sandy, MD;  Location: [place] [facility];  Service: Cardiovascular;  Laterality: Left;  LEFT ILIAC VENOUS  . TOTAL  KNEE ARTHROPLASTY  04/29/2011   Procedure: TOTAL KNEE ARTHROPLASTY;  Surgeon: Lorn Junes, MD;  Location: [place];  Service: Orthopedics;  Laterality: Left;  DR [place] THIS CASE  . TOTAL KNEE ARTHROPLASTY Right 07/04/2014   Procedure: TOTAL KNEE ARTHROPLASTY;  Surgeon: Elsie Saas, MD;  Location: [place];  Service: Orthopedics;  Laterality: Right;    Family History  Problem Relation Age of Onset  . Arthritis Mother   . Hypertension Mother   . Alzheimer's disease Father   . Diabetes Sister   . Hypertension Sister   . Hypertension Brother   . Stroke Brother   . Hypertension Brother   . Hypertension Sister   . Hypertension Sister   . Hypertension Sister   . Breast cancer Other        Niece  . Breast cancer Niece 34       sister's daughter  . Anesthesia problems Neg Hx   . Hypotension Neg Hx   . Malignant hyperthermia Neg Hx   . Pseudochol deficiency Neg Hx   . Colon cancer Neg Hx     Social  History   Socioeconomic History  . Marital status: Divorced    Spouse name: Not on file  . Number of children: 1  . Years of education: Not on file  . Highest education level: Not on file  Occupational History  . Occupation: Retired   Scientific laboratory technician  . Financial resource strain: Not on file  . Food insecurity:    Worry: Not on file    Inability: Not on file  . Transportation needs:    Medical: Not on file    Non-medical: Not on file  Tobacco Use  . Smoking status: Former Smoker    Years: 1.00    Last attempt to quit: 04/22/1988    Years since quitting: 30.2  . Smokeless tobacco: Never Used  Substance and Sexual Activity  . Alcohol use: No  . Drug use: No  . Sexual activity: Yes    Birth control/protection: Surgical  Lifestyle  . Physical activity:    Days per week: Not on file    Minutes per session: Not on file  . Stress: Not on file  Relationships  . Social connections:    Talks on phone: Not on file    Gets together: Not on file    Attends  religious service: Not on file    Active member of club or organization: Not on file    Attends meetings of clubs or organizations: Not on file    Relationship status: Not on file  . Intimate partner violence:    Fear of current or ex partner: Not on file    Emotionally abused: Not on file    Physically abused: Not on file    Forced sexual activity: Not on file  Other Topics Concern  . Not on file  Social History Narrative   Daily caffeine     Past Medical History, Surgical history, Social history, and Family history were reviewed and updated as appropriate.   Please see review of systems for further details on the patient's review from today.   Review of Systems:  Review of Systems  Constitutional: Negative for appetite change, chills, diaphoresis and fever.  HENT: Negative for trouble swallowing.   Respiratory: Negative for cough, chest tightness and shortness of breath.   Cardiovascular: Negative for chest pain, palpitations and leg swelling.  Gastrointestinal: Positive for abdominal pain, diarrhea, nausea and vomiting. Negative for abdominal distention, blood in stool and constipation.  Genitourinary: Negative for decreased urine volume and difficulty urinating.  Neurological: Negative for weakness.    Objective:   Physical Exam:  BP 132/63 (BP Location: Left Arm, Patient Position: Sitting)   Pulse 83   Temp 98.5 F (36.9 C) (Oral)   Resp 18   Ht 5\' 1"  (1.549 m)   Wt 219 lb 3.2 oz (99.4 kg)   SpO2 100%   BMI 41.42 kg/m  ECOG: 1  Physical Exam Constitutional:      General: She is not in acute distress.    Appearance: She is not diaphoretic.  HENT:     Head: Normocephalic and atraumatic.     Mouth/Throat:     Mouth: Mucous membranes are moist.     Pharynx: Oropharynx is clear. No oropharyngeal exudate or posterior oropharyngeal erythema.  Eyes:     General: No scleral icterus.       Right eye: No discharge.        Left eye: No discharge.      Conjunctiva/sclera: Conjunctivae normal.  Neck:     Musculoskeletal: Normal  range of motion and neck supple.  Cardiovascular:     Rate and Rhythm: Normal rate and regular rhythm.     Heart sounds: Normal heart sounds. No murmur. No friction rub. No gallop.   Pulmonary:     Effort: Pulmonary effort is normal. No respiratory distress.     Breath sounds: Normal breath sounds. No wheezing or rales.  Abdominal:     General: Bowel sounds are decreased. There is no distension.     Palpations: Abdomen is soft. There is no mass.     Tenderness: There is no abdominal tenderness. There is no guarding or rebound.  Lymphadenopathy:     Cervical: No cervical adenopathy.  Skin:    General: Skin is warm and dry.     Findings: No erythema or rash.  Neurological:     Mental Status: She is alert.     Coordination: Coordination normal.     Gait: Gait normal.  Psychiatric:        Behavior: Behavior normal.        Thought Content: Thought content normal.        Judgment: Judgment normal.     Lab Review:     Component Value Date/Time   NA 135 07/15/2018 1150   K 3.1 (L) 07/15/2018 1150   CL 102 07/15/2018 1150   CO2 24 07/15/2018 1150   GLUCOSE 131 (H) 07/15/2018 1150   BUN 9 07/15/2018 1150   CREATININE 0.65 07/15/2018 1150   CALCIUM 9.0 07/15/2018 1150   PROT 7.4 07/15/2018 1150   ALBUMIN 3.2 (L) 07/15/2018 1150   AST 12 (L) 07/15/2018 1150   ALT 12 07/15/2018 1150   ALKPHOS 76 07/15/2018 1150   BILITOT 0.6 07/15/2018 1150   GFRNONAA >60 07/15/2018 1150   GFRAA >60 07/15/2018 1150       Component Value Date/Time   WBC 2.3 (L) 07/15/2018 1150   WBC 3.1 (L) 07/09/2018 1241   RBC 3.50 (L) 07/15/2018 1150   HGB 9.4 (L) 07/15/2018 1150   HCT 29.5 (L) 07/15/2018 1150   PLT 238 07/15/2018 1150   MCV 84.3 07/15/2018 1150   MCH 26.9 07/15/2018 1150   MCHC 31.9 07/15/2018 1150   RDW 14.6 07/15/2018 1150   LYMPHSABS 0.1 (L) 07/15/2018 1150   MONOABS 0.1 07/15/2018 1150   EOSABS 0.0  07/15/2018 1150   BASOSABS 0.0 07/15/2018 1150   -------------------------------  Imaging from last 24 hours (if applicable):  Radiology interpretation: Nm Pet Image Initial (pi) Skull Base To Thigh  Result Date: 07/02/2018 CLINICAL DATA:  Initial treatment strategy for uterine carcinoma. EXAM: NUCLEAR MEDICINE PET SKULL BASE TO THIGH TECHNIQUE: 9.4 mCi F-18 FDG was injected intravenously. Full-ring PET imaging was performed from the skull base to thigh after the radiotracer. CT data was obtained and used for attenuation correction and anatomic localization. Fasting blood glucose: 97 mg/dl COMPARISON:  AP CT on 06/01/2018 FINDINGS: Mediastinal blood-pool activity (reference): SUV max = 2.4 Liver activity (reference): SUV max = N/A NECK:  No hypermetabolic lymph nodes or masses. Incidental CT findings:  None. CHEST: No hypermetabolic masses or lymphadenopathy. No suspicious pulmonary nodules seen on CT images. Incidental CT findings:  None. ABDOMEN/PELVIS: No abnormal hypermetabolic activity within the liver, pancreas, adrenal glands, or spleen. No hypermetabolic lymph nodes within the abdomen. Stent seen in the left common iliac and proximal external iliac veins. Large left pelvic wall soft tissue mass is seen which encases this stent, and involves the left acetabulum. This measures 9.3 x  6.4 cm on image 136/4, mildly increased from 8.5 by 5.8 cm previously. This has SUV max of 17.4. Prior hysterectomy. Asymmetric focal FDG uptake is seen in the left vaginal cuff with SUV max of 7.9 mm. Incidental CT findings:  None. SKELETON: No focal hypermetabolic bone lesions to suggest skeletal metastasis. Incidental CT findings:  None. IMPRESSION: Previous hysterectomy, with asymmetric focus of hypermetabolic activity in the left vaginal cuff, suspicious for residual or recurrent carcinoma. Large hypermetabolic soft tissue mass involving the left pelvic sidewall and acetabulum, consistent with metastatic disease. No  evidence metastatic disease within the abdomen, chest, or neck. Electronically Signed   By: Earle Gell M.D.   On: 07/02/2018 15:15   Dg Abd 2 Views  Result Date: 07/15/2018 CLINICAL DATA:  73 year old female with a history of cancer and abdominal pain. EXAM: ABDOMEN - 2 VIEW COMPARISON:  PET CT 07/02/2018 FINDINGS: Gas within stomach small bowel and colon. No abnormal distension. No air-fluid levels. Cholecystectomy. No unexpected radiopaque foreign body. No unexpected calcification. Stent of the left iliac system is unchanged. Irregularity along the left pectineal line of the pelvis, in the region of known tumor and pathologic fracture. No displaced fracture. Surgical clips projecting over the left hemipelvis. Degenerative changes of the bilateral hips. IMPRESSION: Nonobstructive bowel gas pattern. Irregularity along the left pectineal line at the acetabulum, in the region of known tumor involvement and pathologic fracture. No displaced fracture identified. Vascular stent of the left iliac system. Electronically Signed   By: Corrie Mckusick D.O.   On: 07/15/2018 13:50   Ir Imaging Guided Port Insertion  Result Date: 06/29/2018 INDICATION: 73 year old with uterine cancer.  Port-A-Cath needed for treatment. EXAM: FLUOROSCOPIC AND ULTRASOUND GUIDED PLACEMENT OF A SUBCUTANEOUS PORT COMPARISON:  None. MEDICATIONS: Ancef 2 g; The antibiotic was administered within an appropriate time interval prior to skin puncture. ANESTHESIA/SEDATION: Versed 4.0 mg IV; Fentanyl 200 mcg IV; Moderate Sedation Time:  58 minutes The patient was continuously monitored during the procedure by the interventional radiology nurse under my direct supervision. FLUOROSCOPY TIME:  36 seconds, 16 mGy COMPLICATIONS: None immediate. PROCEDURE: The procedure, risks, benefits, and alternatives were explained to the patient. Questions regarding the procedure were encouraged and answered. The patient understands and consents to the procedure.  Patient was placed supine on the interventional table. Ultrasound confirmed a patent right internal jugular vein. The right chest and neck were cleaned with a skin antiseptic and a sterile drape was placed. Maximal barrier sterile technique was utilized including caps, mask, sterile gowns, sterile gloves, sterile drape, hand hygiene and skin antiseptic. The right neck was anesthetized with 1% lidocaine. Small incision was made in the right neck with a blade. Micropuncture set was placed in the right internal jugular vein with ultrasound guidance. The micropuncture wire was used for measurement purposes. The right chest was anesthetized with 1% lidocaine with epinephrine. #15 blade was used to make an incision and a subcutaneous port pocket was formed. [place] was assembled. Subcutaneous tunnel was formed with a stiff tunneling device. The port catheter was brought through the subcutaneous tunnel. The port was placed in the subcutaneous pocket and sutured in place. The micropuncture set was exchanged for a peel-away sheath. The catheter was placed through the peel-away sheath and the tip was positioned at the SVC and right atrium junction. Catheter placement was confirmed with fluoroscopy. The port was accessed and flushed with heparinized saline. The port pocket was closed using two layers of absorbable sutures and Dermabond. The  vein skin site was closed using a single layer of absorbable suture and Dermabond. Sterile dressings were applied. Patient tolerated the procedure well without an immediate complication. Ultrasound and fluoroscopic images were taken and saved for this procedure. IMPRESSION: Placement of a subcutaneous port device. Catheter tip at the SVC and right atrium junction. Electronically Signed   By: Markus Daft M.D.   On: 06/29/2018 15:59

## 2018-07-17 ENCOUNTER — Telehealth: Payer: Self-pay | Admitting: *Deleted

## 2018-07-17 ENCOUNTER — Ambulatory Visit
Admission: RE | Admit: 2018-07-17 | Discharge: 2018-07-17 | Disposition: A | Discharge status: Home / Self Care | Payer: Medicare Other | Source: Ambulatory Visit | Attending: Radiation Oncology | Admitting: Radiation Oncology

## 2018-07-17 ENCOUNTER — Other Ambulatory Visit: Payer: Self-pay

## 2018-07-17 DIAGNOSIS — Z510 Encounter for antineoplastic radiation therapy: Secondary | ICD-10-CM | POA: Diagnosis not present

## 2018-07-17 NOTE — Telephone Encounter (Signed)
Telephone call to patient, and son to check status following [facility] visit. Patient reports she is feeling much better. She has been experimenting with different food choices to help her appetite. She denies any concerns with diarrhea, nausea or vomiting. She understands to call the office if any symptoms return.

## 2018-07-17 NOTE — Telephone Encounter (Signed)
-----   Message from [name], MD sent at 07/17/2018  7:37 AM EDT ----- Regarding: call son She had nausea and vomiting 2 days ago Is she ok? Does she need any IVF before the weekend?

## 2018-07-19 ENCOUNTER — Encounter (HOSPITAL_COMMUNITY): Payer: Self-pay

## 2018-07-19 ENCOUNTER — Other Ambulatory Visit: Payer: Self-pay

## 2018-07-19 ENCOUNTER — Emergency Department (HOSPITAL_COMMUNITY)
Admission: EM | Admit: 2018-07-19 | Discharge: 2018-07-19 | Disposition: A | Discharge status: Home / Self Care | Payer: Medicare Other | Attending: Emergency Medicine | Admitting: Emergency Medicine

## 2018-07-19 DIAGNOSIS — Z79899 Other long term (current) drug therapy: Secondary | ICD-10-CM | POA: Diagnosis not present

## 2018-07-19 DIAGNOSIS — Z96653 Presence of artificial knee joint, bilateral: Secondary | ICD-10-CM | POA: Insufficient documentation

## 2018-07-19 DIAGNOSIS — R197 Diarrhea, unspecified: Secondary | ICD-10-CM | POA: Diagnosis not present

## 2018-07-19 DIAGNOSIS — C7951 Secondary malignant neoplasm of bone: Secondary | ICD-10-CM | POA: Insufficient documentation

## 2018-07-19 DIAGNOSIS — Z7901 Long term (current) use of anticoagulants: Secondary | ICD-10-CM | POA: Diagnosis not present

## 2018-07-19 DIAGNOSIS — Z87891 Personal history of nicotine dependence: Secondary | ICD-10-CM | POA: Diagnosis not present

## 2018-07-19 DIAGNOSIS — R112 Nausea with vomiting, unspecified: Secondary | ICD-10-CM | POA: Insufficient documentation

## 2018-07-19 DIAGNOSIS — R1084 Generalized abdominal pain: Secondary | ICD-10-CM | POA: Diagnosis present

## 2018-07-19 DIAGNOSIS — C539 Malignant neoplasm of cervix uteri, unspecified: Secondary | ICD-10-CM | POA: Insufficient documentation

## 2018-07-19 DIAGNOSIS — I10 Essential (primary) hypertension: Secondary | ICD-10-CM | POA: Diagnosis not present

## 2018-07-19 LAB — CBC
HCT: 31.4 % — ABNORMAL LOW (ref 36.0–46.0)
Hemoglobin: 9.8 g/dL — ABNORMAL LOW (ref 12.0–15.0)
MCH: 27 pg (ref 26.0–34.0)
MCHC: 31.2 g/dL (ref 30.0–36.0)
MCV: 86.5 fL (ref 80.0–100.0)
Platelets: 202 10*3/uL (ref 150–400)
RBC: 3.63 MIL/uL — ABNORMAL LOW (ref 3.87–5.11)
RDW: 14.8 % (ref 11.5–15.5)
WBC: 1.6 10*3/uL — ABNORMAL LOW (ref 4.0–10.5)
nRBC: 2.4 % — ABNORMAL HIGH (ref 0.0–0.2)

## 2018-07-19 LAB — DIFFERENTIAL
Basophils Absolute: 0 10*3/uL (ref 0.0–0.1)
Basophils Relative: 1 %
Eosinophils Absolute: 0 10*3/uL (ref 0.0–0.5)
Eosinophils Relative: 2 %
Lymphocytes Relative: 21 %
Lymphs Abs: 0.4 10*3/uL — ABNORMAL LOW (ref 0.7–4.0)
Monocytes Absolute: 0.5 10*3/uL (ref 0.1–1.0)
Monocytes Relative: 28 %
Neutro Abs: 0.8 10*3/uL — ABNORMAL LOW (ref 1.7–7.7)
Neutrophils Relative %: 47 %

## 2018-07-19 LAB — COMPREHENSIVE METABOLIC PANEL
ALT: 19 U/L (ref 0–44)
AST: 19 U/L (ref 15–41)
Albumin: 3.1 g/dL — ABNORMAL LOW (ref 3.5–5.0)
Alkaline Phosphatase: 72 U/L (ref 38–126)
Anion gap: 12 (ref 5–15)
BUN: 10 mg/dL (ref 8–23)
CO2: 25 mmol/L (ref 22–32)
Calcium: 8.4 mg/dL — ABNORMAL LOW (ref 8.9–10.3)
Chloride: 100 mmol/L (ref 98–111)
Creatinine, Ser: 0.65 mg/dL (ref 0.44–1.00)
GFR calc Af Amer: >60 mL/min (ref >60)
GFR calc non Af Amer: >60 mL/min (ref >60)
Glucose, Bld: 109 mg/dL — ABNORMAL HIGH (ref 70–99)
Potassium: 2.5 mmol/L — CL (ref 3.5–5.1)
Sodium: 137 mmol/L (ref 135–145)
Total Bilirubin: 0.6 mg/dL (ref 0.3–1.2)
Total Protein: 6.8 g/dL (ref 6.5–8.1)

## 2018-07-19 LAB — MAGNESIUM: Magnesium: 1.9 mg/dL (ref 1.7–2.4)

## 2018-07-19 LAB — TYPE AND SCREEN
ABO/RH(D): B POS
Antibody Screen: NEGATIVE

## 2018-07-19 MED ORDER — POTASSIUM CHLORIDE 10 MEQ/100ML IV SOLN
10.0000 meq | INTRAVENOUS | Status: AC
  Administered 2018-07-19 (×2): 10 meq via INTRAVENOUS
  Filled 2018-07-19 (×2): qty 100

## 2018-07-19 MED ORDER — SODIUM CHLORIDE 0.9 % IV BOLUS
1000.0000 mL | Freq: Once | INTRAVENOUS | Status: AC
  Administered 2018-07-19: 1000 mL via INTRAVENOUS

## 2018-07-19 MED ORDER — ONDANSETRON HCL 4 MG/2ML IJ SOLN
4.0000 mg | Freq: Once | INTRAMUSCULAR | Status: AC
  Administered 2018-07-19: 4 mg via INTRAVENOUS
  Filled 2018-07-19: qty 2

## 2018-07-19 MED ORDER — AZITHROMYCIN 250 MG PO TABS: ORAL_TABLET | ORAL | 0 refills | Status: DC

## 2018-07-19 MED ORDER — MORPHINE SULFATE (PF) 4 MG/ML IV SOLN
4.0000 mg | Freq: Once | INTRAVENOUS | Status: AC
  Administered 2018-07-19: 16:00:00 4 mg via INTRAVENOUS
  Filled 2018-07-19: qty 1

## 2018-07-19 MED ORDER — POTASSIUM CHLORIDE CRYS ER 20 MEQ PO TBCR
40.0000 meq | EXTENDED_RELEASE_TABLET | Freq: Once | ORAL | Status: AC
  Administered 2018-07-19: 40 meq via ORAL
  Filled 2018-07-19: qty 2

## 2018-07-19 MED ORDER — ONDANSETRON 4 MG PO TBDP: ORAL_TABLET | ORAL | 0 refills | Status: DC

## 2018-07-19 NOTE — Discharge Instructions (Signed)
Follow up with your family doc.  °

## 2018-07-19 NOTE — ED Notes (Signed)
Pt ambulated to the restroom with 2 staff assist.

## 2018-07-19 NOTE — ED Provider Notes (Signed)
[facility] DEPT Provider Note   CSN: [id number] Arrival date & time: 07/19/18  1450    History   Chief Complaint Chief Complaint  Patient presents with  . Abdominal Pain  . GI Bleeding  . Cancer Patient    HPI [name] is a 73 y.o. female.     73 yo F with a chief complaint of diarrhea.  This been going on for about a week.  She describes it as bloody.  She denies lightheadedness shortness of breath feels like she may pass out.  She has felt generally weak.  Denies cough congestion or fever.  She is currently getting chemotherapy and radiation.  Denies history of neutropenia.  Feels that she has diffuse crampy abdominal pain and then has a bowel movement and improves.  The history is provided by the patient.  Abdominal Pain  Pain location:  LLQ, RLQ and suprapubic Pain quality: aching, cramping, sharp and shooting   Pain radiates to:  Does not radiate Pain severity:  Moderate Onset quality:  Gradual Duration:  1 week Timing:  Constant Progression:  Worsening Chronicity:  New Relieved by:  Nothing Worsened by:  Nothing Ineffective treatments:  None tried Associated symptoms: diarrhea   Associated symptoms: no chest pain, no chills, no dysuria, no fever, no nausea, no shortness of breath and no vomiting     Past Medical History:  Diagnosis Date  . Acute upper respiratory infection 07/06/2014  . Anemia   . Arthritis    Back   . Colon polyp    Tubular Adenoma   . Cough productive of clear sputum 06/22/2014  . Family history of breast cancer   . GERD (gastroesophageal reflux disease)   . History of right bundle branch block (RBBB)   . HOH (hard of hearing)   . Hypertension    had in the past, is no longer on medication for this and blood pressures are WNL  . 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 Primary localized osteoarthritis of right knee   . Uterine cancer ([place]) 06/23/2018    Patient Active Problem List   Diagnosis Date Noted  .  Family history of breast cancer   . 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 Other constipation 06/24/2018  . Goals of care, counseling/discussion 06/24/2018  . 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 ([place]) 06/24/2018  . Uterine cancer ([place]) 06/23/2018  . 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 Lower leg DVT (deep venous thromboembolism), chronic, left ([place]) 06/23/2018  . DVT (deep venous thrombosis) ([place]) 01/18/2018  . DJD (degenerative joint disease) of knee 07/04/2014  . Primary localized osteoarthritis of right knee   . Arthritis   . Anemia   . GERD (gastroesophageal reflux disease)   . Colon polyp   . HOH (hard of hearing)   . Postoperative anemia due to acute blood loss 05/01/2011  . 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 ABDOMINAL HYSTERECTOMY  2012  . CHOLECYSTECTOMY N/A 03/09/2013   Procedure: LAPAROSCOPIC CHOLECYSTECTOMY;  Surgeon: Gayland Curry, MD;  Location: [facility];  Service: General;  Laterality: N/A;  . COLONOSCOPY W/ BIOPSIES    . IR IMAGING GUIDED PORT INSERTION  06/29/2018  . LARYNGOSCOPY Left 03/14/2017   Procedure: LARYNGOSCOPY;  Surgeon: Helayne Seminole, MD;  Location: [facility];  Service: ENT;  Laterality: Left;  . LOWER EXTREMITY VENOGRAPHY Left 01/19/2018  Procedure: LOWER EXTREMITY VENOGRAPHY;  Surgeon: Waynetta Sandy, MD;  Location: [facility];  Service: Cardiovascular;  Laterality: Left;  . LYMPH NODE BIOPSY Left 06/18/2018   Procedure: EXCISIONAL BIOPSY LEFT INGUINAL LYMPH NODE;  Surgeon: Coralie Keens, MD;  Location: [facility];  Service: General;  Laterality: Left;  . PERIPHERAL VASCULAR INTERVENTION Left 01/19/2018   Procedure: PERIPHERAL VASCULAR INTERVENTION;  Surgeon: Waynetta Sandy, MD;  Location: [facility];  Service: Cardiovascular;  Laterality: Left;  LEFT ILIAC VENOUS  . TOTAL KNEE  ARTHROPLASTY  04/29/2011   Procedure: TOTAL KNEE ARTHROPLASTY;  Surgeon: Lorn Junes, MD;  Location: [facility];  Service: Orthopedics;  Laterality: Left;  DR [place] THIS CASE  . TOTAL KNEE ARTHROPLASTY Right 07/04/2014   Procedure: TOTAL KNEE ARTHROPLASTY;  Surgeon: Elsie Saas, MD;  Location: [facility];  Service: Orthopedics;  Laterality: Right;     OB History   No obstetric history on file.      Home Medications    Prior to Admission medications   Medication Sig Start Date End Date Taking? Authorizing Provider  clopidogrel (PLAVIX) 75 MG tablet Take 1 tablet (75 mg total) by mouth daily with breakfast. 01/21/18  Yes Laurence Slate M, PA-C  cycloSPORINE (RESTASIS) 0.05 % ophthalmic emulsion Place 1 drop into both eyes 2 (two) times daily.   Yes [provider]  dexamethasone (DECADRON) 4 MG tablet Take 2 tabs at the night before and 2 tabs the morning of chemotherapy, every 3 weeks, by mouth Patient taking differently: Take 8 mg by mouth See admin instructions. Take 8 mg the night before chemo and 8 mg the morning of chemotherapy every 3 weeks 06/30/18  Yes Gorsuch, Ni, MD  lidocaine-prilocaine (EMLA) cream Apply to affected area once 06/30/18  Yes Gorsuch, Ni, MD  morphine (MS CONTIN) 30 MG 12 hr tablet Take 1 tablet (30 mg total) by mouth every 12 (twelve) hours. Patient taking differently: Take 30 mg by mouth every 12 (twelve) hours as needed for pain.  07/07/18  Yes Gorsuch, Ni, MD  morphine (MSIR) 15 MG tablet Take 1 tablet (15 mg total) by mouth every 6 (six) hours as needed for severe pain. 07/07/18  Yes Gorsuch, Ni, MD  Olopatadine HCl 0.2 % SOLN Place 1 drop into both eyes daily. 12/06/17  Yes [provider]  ondansetron (ZOFRAN) 8 MG tablet Take 1 tablet (8 mg total) by mouth every 8 (eight) hours as needed. Patient taking differently: Take 8 mg by mouth every 8 (eight) hours as needed for nausea or vomiting.  06/30/18  Yes Gorsuch, Ni, MD  potassium  chloride SA (K-DUR) 20 MEQ tablet Take 1 tablet (20 mEq total) by mouth daily. 07/15/18  Yes Tanner, Lyndon Code., PA-C  prochlorperazine (COMPAZINE) 10 MG tablet Take 1 tablet (10 mg total) by mouth every 6 (six) hours as needed (Nausea or vomiting). Patient taking differently: Take 10 mg by mouth every 6 (six) hours as needed for nausea or vomiting.  06/30/18  Yes [name], MD  rivaroxaban (XARELTO) 20 MG TABS tablet Take 20 mg by mouth daily with breakfast.   Yes [provider]  azithromycin (ZITHROMAX) 250 MG tablet Take first 2 tablets a day for three days 07/19/18   Deno Etienne, DO  polyethylene glycol (MIRALAX / GLYCOLAX) 17 g packet Take 17 g by mouth daily. Patient not taking: Reported on 07/19/2018 06/24/18   [name], MD    Family History Family History  Problem Relation Age of Onset  . Arthritis Mother   . Hypertension Mother   . Alzheimer's disease Father   . Diabetes Sister   . Hypertension Sister   . Hypertension Brother   . Stroke Brother   . Hypertension Brother   . Hypertension Sister   . Hypertension Sister   . Hypertension Sister   . Breast cancer Other        Niece  . Breast cancer Niece 62       sister's daughter  . Anesthesia problems Neg Hx   . Hypotension Neg Hx   . Malignant hyperthermia Neg Hx   . Pseudochol deficiency Neg Hx   . Colon cancer Neg Hx     Social History Social History   Tobacco Use  . Smoking status: Former Smoker    Years: 1.00    Last attempt to quit: 04/22/1988    Years since quitting: 30.2  . Smokeless tobacco: Never Used  Substance Use Topics  . Alcohol use: No  . Drug use: No     Allergies   Patient has no known allergies.   Review of Systems Review of Systems  Constitutional: Negative for chills and fever.  HENT: Negative for congestion and rhinorrhea.   Eyes: Negative for redness and visual disturbance.  Respiratory: Negative for shortness of breath and wheezing.   Cardiovascular: Negative for chest pain and  palpitations.  Gastrointestinal: Positive for abdominal pain and diarrhea. Negative for nausea and vomiting.  Genitourinary: Negative for dysuria and urgency.  Musculoskeletal: Negative for arthralgias and myalgias.  Skin: Negative for pallor and wound.  Neurological: Negative for dizziness and headaches.     Physical Exam Updated Vital Signs BP 133/65 (BP Location: Left Arm)   Pulse 81   Temp 98.7 F (37.1 C) (Oral)   Resp 20   Ht '5\' 1"'  (1.549 m)   Wt 104.3 kg   SpO2 100%   BMI 43.46 kg/m   Physical Exam Vitals signs and nursing note reviewed.  Constitutional:      General: She is not in acute distress.    Appearance: She is well-developed. She is not diaphoretic.  HENT:     Head: Normocephalic and atraumatic.  Eyes:     Pupils: Pupils are equal, round, and reactive to light.  Neck:     Musculoskeletal: Normal range of motion and neck supple.  Cardiovascular:     Rate and Rhythm: Normal rate and regular rhythm.     Heart sounds: No murmur. No friction rub. No gallop.   Pulmonary:     Effort: Pulmonary effort is normal.     Breath sounds: No wheezing or rales.  Abdominal:     General: There is no distension.     Palpations: Abdomen is soft.     Tenderness: There is no abdominal tenderness.     Comments: Benign abdominal exam.  Points to lower abdomen as area of pain  Musculoskeletal:        General: No tenderness.  Skin:    General: Skin is warm and dry.  Neurological:     Mental Status: She is alert and oriented to person, place, and time.  Psychiatric:        Behavior: Behavior normal.      ED Treatments / Results  Labs (all labs ordered are listed, but only abnormal results are displayed) Labs Reviewed  COMPREHENSIVE METABOLIC PANEL - Abnormal; Notable for the following components:      Result Value  Potassium 2.5 (*)    Glucose, Bld 109 (*)    Calcium 8.4 (*)    Albumin 3.1 (*)    All other components within normal limits  CBC - Abnormal;  Notable for the following components:   WBC 1.6 (*)    RBC 3.63 (*)    Hemoglobin 9.8 (*)    HCT 31.4 (*)    nRBC 2.4 (*)    All other components within normal limits  GASTROINTESTINAL PANEL BY PCR, STOOL (REPLACES STOOL CULTURE)  C DIFFICILE QUICK SCREEN W PCR REFLEX  MAGNESIUM  DIFFERENTIAL  POC OCCULT BLOOD, ED  TYPE AND SCREEN    EKG EKG Interpretation  Date/Time:  'Sunday Jul 19 2018 16:40:35 EDT Ventricular Rate:  78 PR Interval:    QRS Duration: 147 QT Interval:  419 QTC Calculation: 478 R Axis:   -34 Text Interpretation:  Sinus rhythm Borderline short PR interval Right bundle branch block Left ventricular hypertrophy background noise vs u waves in the inferior leads Otherwise no significant change Confirmed by Sadarius Norman ([id number]) on 07/19/2018 4:43:53 PM   Radiology No results found.  Procedures Procedures (including critical care time)  Medications Ordered in ED Medications  sodium chloride 0.9 % bolus 1,000 mL (0 mLs Intravenous Stopped 07/19/18 1834)  morphine 4 MG/ML injection 4 mg (4 mg Intravenous Given 07/19/18 1611)  ondansetron (ZOFRAN) injection 4 mg (4 mg Intravenous Given 07/19/18 1611)  potassium chloride SA (K-DUR) CR tablet 40 mEq (40 mEq Oral Given 07/19/18 1723)  potassium chloride 10 mEq in 100 mL IVPB (0 mEq Intravenous Stopped 07/19/18 2005)     Initial Impression / Assessment and Plan / ED Course  I have reviewed the triage vital signs and the nursing notes.  Pertinent labs & imaging results that were available during my care of the patient were reviewed by me and considered in my medical decision making (see chart for details).        73'  yo F with a chief complaint of diarrhea.  Going on for the past week.  Described as grossly bloody.  The patient is on anticoagulation for DVT.  Having diffuse abdominal cramping that usually is improved after having a bowel movement.  No fevers.  Will obtain a laboratory evaluation give pain nausea  medicine obtain a stool sample for possible infectious diarrhea.  The patient does have some leukopenia.  Unfortunately a differential was not ordered on initial CBC.  Will send for differential.  Patient CMP has hypokalemia.  Her hemoglobin appears to be at baseline.  I will defer rectal exam for the possibility of neutropenia.  The patient's differential is still not yet returned.  I reassessed the patient and she is continued to feel well she is requesting discharge at this time.  Her potassium was low at 2.5, her EKG showed possible U waves though I think it was more consistent with background noise.  She was given 2 mEq of potassium IV, also was given 40 of p.o.  We will have her continue her home potassium supplements.  Have her take Imodium for diarrhea.  With her possible neutropenia will cover her with antibiotics for general infectious diarrhea.  Have her call her oncologist tomorrow.  Return for worsening diarrhea or fatigue abdominal pain or fever.  8:22 PM:  I have discussed the diagnosis/risks/treatment options with the patient and believe the pt to be eligible for discharge home to follow-up with PCP. We also discussed returning to the ED immediately if new or  worsening sx occur. We discussed the sx which are most concerning (e.g., sudden worsening pain, fever, inability to tolerate by mouth) that necessitate immediate return. Medications administered to the patient during their visit and any new prescriptions provided to the patient are listed below.  Medications given during this visit Medications  sodium chloride 0.9 % bolus 1,000 mL (0 mLs Intravenous Stopped 07/19/18 1834)  morphine 4 MG/ML injection 4 mg (4 mg Intravenous Given 07/19/18 1611)  ondansetron (ZOFRAN) injection 4 mg (4 mg Intravenous Given 07/19/18 1611)  potassium chloride SA (K-DUR) CR tablet 40 mEq (40 mEq Oral Given 07/19/18 1723)  potassium chloride 10 mEq in 100 mL IVPB (0 mEq Intravenous Stopped 07/19/18 2005)      The patient appears reasonably screen and/or stabilized for discharge and I doubt any other medical condition or other [facility] requiring further screening, evaluation, or treatment in the ED at this time prior to discharge.   Final Clinical Impressions(s) / ED Diagnoses   Final diagnoses:  Nausea vomiting and diarrhea    ED Discharge Orders         Ordered    azithromycin (ZITHROMAX) 250 MG tablet     07/19/18 2020           Deno Etienne, DO 07/19/18 2022

## 2018-07-19 NOTE — ED Notes (Signed)
Patient reports her son is her ride home.

## 2018-07-19 NOTE — ED Notes (Signed)
Date and time results received: 07/19/18 4:39 PM  Test: Potassium Critical Value: 2.5  Name of Provider Notified: Tyrone Nine MD  Orders Received? Or Actions Taken?: Orders Received - See Orders for details

## 2018-07-19 NOTE — ED Triage Notes (Signed)
Patient c/o lower abdominal pain and GI bleeding with bright red blood in her stool since last night.  Patient states she was prescribed medicine for diarrhea from the cancer center,but it is not working. Patient states she has had diarrhea x 1 week.

## 2018-07-19 NOTE — ED Notes (Signed)
ED Provider at bedside. 

## 2018-07-20 ENCOUNTER — Other Ambulatory Visit: Payer: Self-pay

## 2018-07-20 ENCOUNTER — Ambulatory Visit: Payer: Medicare Other

## 2018-07-20 ENCOUNTER — Telehealth: Payer: Self-pay | Admitting: Oncology

## 2018-07-20 ENCOUNTER — Encounter: Payer: Self-pay | Admitting: Genetic Counselor

## 2018-07-20 ENCOUNTER — Telehealth: Payer: Self-pay | Admitting: Genetic Counselor

## 2018-07-20 ENCOUNTER — Ambulatory Visit
Admission: RE | Admit: 2018-07-20 | Discharge: 2018-07-20 | Disposition: A | Discharge status: Home / Self Care | Payer: Medicare Other | Source: Ambulatory Visit | Attending: Radiation Oncology | Admitting: Radiation Oncology

## 2018-07-20 DIAGNOSIS — Z510 Encounter for antineoplastic radiation therapy: Secondary | ICD-10-CM | POA: Insufficient documentation

## 2018-07-20 DIAGNOSIS — C541 Malignant neoplasm of endometrium: Secondary | ICD-10-CM | POA: Diagnosis not present

## 2018-07-20 DIAGNOSIS — Z1379 Encounter for other screening for genetic and chromosomal anomalies: Secondary | ICD-10-CM | POA: Insufficient documentation

## 2018-07-20 NOTE — Telephone Encounter (Signed)
Revealed negative genetic testing.  Discussed that we do not know why she has uterine cancer or why there is cancer in the family. It could be due to a different gene that we are not testing, or maybe our current technology may not be able to pick something up.  It will be important for her to keep in contact with genetics to keep up with whether additional testing may be needed.    

## 2018-07-20 NOTE — Telephone Encounter (Signed)
Called [name] regarding her ER visit yesterday.  She said she is having frequent diarrhea which is making her feel tired.  Discussed taking Imodium over the counter to help control the diarrhea.  She will have her son go and get it this morning.  Also discussed appointment for labs/flush after radiation at 11:45 and to see Dr. Alvy Bimler tomorrow at 12:45.  She verbalized understanding and agreement.

## 2018-07-21 ENCOUNTER — Other Ambulatory Visit: Payer: Medicare Other

## 2018-07-21 ENCOUNTER — Encounter: Payer: Self-pay | Admitting: Genetic Counselor

## 2018-07-21 ENCOUNTER — Other Ambulatory Visit: Payer: Self-pay

## 2018-07-21 ENCOUNTER — Telehealth: Payer: Self-pay | Admitting: Oncology

## 2018-07-21 ENCOUNTER — Ambulatory Visit: Payer: Self-pay | Admitting: Genetic Counselor

## 2018-07-21 ENCOUNTER — Inpatient Hospital Stay: Payer: Medicare Other | Attending: Hematology and Oncology | Admitting: Hematology and Oncology

## 2018-07-21 ENCOUNTER — Encounter: Payer: Self-pay | Admitting: Hematology and Oncology

## 2018-07-21 ENCOUNTER — Ambulatory Visit
Admission: RE | Admit: 2018-07-21 | Discharge: 2018-07-21 | Disposition: A | Discharge status: Home / Self Care | Payer: Medicare Other | Source: Ambulatory Visit | Attending: Radiation Oncology | Admitting: Radiation Oncology

## 2018-07-21 ENCOUNTER — Encounter: Payer: Self-pay | Admitting: Radiation Oncology

## 2018-07-21 ENCOUNTER — Inpatient Hospital Stay: Payer: Medicare Other

## 2018-07-21 DIAGNOSIS — Z87891 Personal history of nicotine dependence: Secondary | ICD-10-CM | POA: Diagnosis not present

## 2018-07-21 DIAGNOSIS — Z510 Encounter for antineoplastic radiation therapy: Secondary | ICD-10-CM | POA: Diagnosis not present

## 2018-07-21 DIAGNOSIS — K219 Gastro-esophageal reflux disease without esophagitis: Secondary | ICD-10-CM | POA: Insufficient documentation

## 2018-07-21 DIAGNOSIS — C774 Secondary and unspecified malignant neoplasm of inguinal and lower limb lymph nodes: Secondary | ICD-10-CM

## 2018-07-21 DIAGNOSIS — Z86718 Personal history of other venous thrombosis and embolism: Secondary | ICD-10-CM | POA: Diagnosis not present

## 2018-07-21 DIAGNOSIS — Z5111 Encounter for antineoplastic chemotherapy: Secondary | ICD-10-CM | POA: Insufficient documentation

## 2018-07-21 DIAGNOSIS — I10 Essential (primary) hypertension: Secondary | ICD-10-CM | POA: Insufficient documentation

## 2018-07-21 DIAGNOSIS — Z1379 Encounter for other screening for genetic and chromosomal anomalies: Secondary | ICD-10-CM

## 2018-07-21 DIAGNOSIS — I825Z2 Chronic embolism and thrombosis of unspecified deep veins of left distal lower extremity: Secondary | ICD-10-CM

## 2018-07-21 DIAGNOSIS — L03116 Cellulitis of left lower limb: Secondary | ICD-10-CM | POA: Insufficient documentation

## 2018-07-21 DIAGNOSIS — C7951 Secondary malignant neoplasm of bone: Secondary | ICD-10-CM

## 2018-07-21 DIAGNOSIS — Z923 Personal history of irradiation: Secondary | ICD-10-CM | POA: Diagnosis not present

## 2018-07-21 DIAGNOSIS — Z79899 Other long term (current) drug therapy: Secondary | ICD-10-CM | POA: Diagnosis not present

## 2018-07-21 DIAGNOSIS — C55 Malignant neoplasm of uterus, part unspecified: Secondary | ICD-10-CM

## 2018-07-21 DIAGNOSIS — Z7901 Long term (current) use of anticoagulants: Secondary | ICD-10-CM | POA: Diagnosis not present

## 2018-07-21 DIAGNOSIS — D61818 Other pancytopenia: Secondary | ICD-10-CM | POA: Insufficient documentation

## 2018-07-21 DIAGNOSIS — C541 Malignant neoplasm of endometrium: Secondary | ICD-10-CM | POA: Insufficient documentation

## 2018-07-21 DIAGNOSIS — I82502 Chronic embolism and thrombosis of unspecified deep veins of left lower extremity: Secondary | ICD-10-CM | POA: Diagnosis not present

## 2018-07-21 DIAGNOSIS — G893 Neoplasm related pain (acute) (chronic): Secondary | ICD-10-CM

## 2018-07-21 DIAGNOSIS — Z7189 Other specified counseling: Secondary | ICD-10-CM

## 2018-07-21 LAB — CBC WITH DIFFERENTIAL (CANCER CENTER ONLY)
Abs Immature Granulocytes: 0.19 10*3/uL — ABNORMAL HIGH (ref 0.00–0.07)
Basophils Absolute: 0 10*3/uL (ref 0.0–0.1)
Basophils Relative: 0 %
Eosinophils Absolute: 0 10*3/uL (ref 0.0–0.5)
Eosinophils Relative: 1 %
HCT: 30.1 % — ABNORMAL LOW (ref 36.0–46.0)
Hemoglobin: 9.3 g/dL — ABNORMAL LOW (ref 12.0–15.0)
Immature Granulocytes: 5 %
Lymphocytes Relative: 11 %
Lymphs Abs: 0.4 10*3/uL — ABNORMAL LOW (ref 0.7–4.0)
MCH: 26.8 pg (ref 26.0–34.0)
MCHC: 30.9 g/dL (ref 30.0–36.0)
MCV: 86.7 fL (ref 80.0–100.0)
Monocytes Absolute: 0.6 10*3/uL (ref 0.1–1.0)
Monocytes Relative: 16 %
Neutro Abs: 2.6 10*3/uL (ref 1.7–7.7)
Neutrophils Relative %: 67 %
Platelet Count: 162 10*3/uL (ref 150–400)
RBC: 3.47 MIL/uL — ABNORMAL LOW (ref 3.87–5.11)
RDW: 15.2 % (ref 11.5–15.5)
WBC Count: 3.9 10*3/uL — ABNORMAL LOW (ref 4.0–10.5)
nRBC: 0.8 % — ABNORMAL HIGH (ref 0.0–0.2)

## 2018-07-21 LAB — CMP (CANCER CENTER ONLY)
ALT: 20 U/L (ref 0–44)
AST: 18 U/L (ref 15–41)
Albumin: 3 g/dL — ABNORMAL LOW (ref 3.5–5.0)
Alkaline Phosphatase: 79 U/L (ref 38–126)
Anion gap: 10 (ref 5–15)
BUN: 8 mg/dL (ref 8–23)
CO2: 28 mmol/L (ref 22–32)
Calcium: 8.8 mg/dL — ABNORMAL LOW (ref 8.9–10.3)
Chloride: 99 mmol/L (ref 98–111)
Creatinine: 0.78 mg/dL (ref 0.44–1.00)
GFR, Est AFR Am: >60 mL/min (ref >60)
GFR, Estimated: >60 mL/min (ref >60)
Glucose, Bld: 103 mg/dL — ABNORMAL HIGH (ref 70–99)
Potassium: 3.2 mmol/L — ABNORMAL LOW (ref 3.5–5.1)
Sodium: 137 mmol/L (ref 135–145)
Total Bilirubin: 0.6 mg/dL (ref 0.3–1.2)
Total Protein: 7.1 g/dL (ref 6.5–8.1)

## 2018-07-21 MED ORDER — RIVAROXABAN 20 MG PO TABS: 20.0000 mg | ORAL_TABLET | Freq: Every day | ORAL | 11 refills | Status: DC

## 2018-07-21 NOTE — Assessment & Plan Note (Signed)
She has completed radiation therapy today She has fully recovered from side effects of recent treatment She has excellent control of her pain and is ready for MS Contin taper in the future I have updated her son I plan to see her again next week before cycle 2 of treatment.

## 2018-07-21 NOTE — Assessment & Plan Note (Signed)
She denies worsening leg swelling after radiation treatment I refill her prescription Xarelto We will contact her vascular physician to see if her CT venogram can be deferred until a later date due to recent imaging study

## 2018-07-21 NOTE — Telephone Encounter (Signed)
Left a message for Dr. Claretha Cooper nurse regarding CT venogram on 07/27/18. Requested a return call.

## 2018-07-21 NOTE — Progress Notes (Signed)
[place] OFFICE PROGRESS NOTE  Patient Care Team: Nolene Ebbs, MD as PCP - General (Internal Medicine)  ASSESSMENT & PLAN:  Uterine cancer [facility]) She has completed radiation therapy today She has fully recovered from side effects of recent treatment She has excellent control of her pain and is ready for MS Contin taper in the future I have updated her son I plan to see her again next week before cycle 2 of treatment.  Pancytopenia, acquired [facility]) She has acquired pancytopenia from treatment but her blood counts are improving since her last blood draw She does not need transfusion support  Lower leg DVT (deep venous thromboembolism), chronic, left ([place]) She denies worsening leg swelling after radiation treatment I refill her prescription Xarelto We will contact her vascular physician to see if her CT venogram can be deferred until a later date due to recent imaging study   Cancer associated pain Her pain control is excellent With her next refill, I plan to reduce MS Contin back to 15 mg with eventual taper.  She agreed with the plan   No orders of the defined types were placed in this encounter.   INTERVAL HISTORY: Please see below for problem oriented charting. She returns today for further follow-up She went to the emergency department recently with diarrhea and bloody stool Her bleeding has stopped Her changes in bowel habits had resolved and she is having regular normal bowel movement Her pain is well controlled with current prescription MS Contin and IR morphine I collaborated the history with the son as well She felt great today with no other complaints.  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 Uterus +/- tubes/ovaries, neoplastic, with left  fallopian tube and ovary - INVASIVE ENDOMETRIOID CARCINOMA (1.5 CM), FIGO GRADE II, ARISING IN A BACKGROUND OF ATYPICAL COMPLEX HYPERPLASIA, CONFINED WITHIN INNER HALF OF THE MYOMETRIUM. - ENDOMETRIAL POLYP WITH ASSOCIATED ATYPICAL COMPLEX HYPERPLASIA. - MYOMETRIUM: LEIOMYOMATA. - CERVIX: BENIGN SQUAMOUS MUCOSA AND ENDOCERVICAL MUCOSA, NO DYSPLASIA OR MALIGNANCY. - LEFT OVARY: BENIGN OVARIAN TISSUE WITH ENDOSALPINGOSIS, NO EVIDENCE OF ATYPIA OR MALIGNANCY. - LEFT FALLOPIAN TUBE: NO HISTOLOGIC ABNORMALITIES. - PLEASE SEE ONCOLOGY TEMPLATE FOR DETAIL. 2. Ovary and fallopian tube, right - BENIGN OVARIAN TISSUE WITH ENDOSALPINGOSIS, NO ATYPIA OR MALIGNANCY. - BENIGN FALLOPIAN TUBAL TISSUE, NO PATHOLOGIC ABNORMALITIES. Microscopic Comment 1. UTERUS Specimen: Uterus, cervix, bilateral ovaries and fallopian tubes Procedure: Total hysterectomy and bilateral salpingo-oophorectomy Lymph node sampling performed: No Specimen integrity: Intact Maximum tumor size (cm): 1.5 cm, glass slide measurement Histologic type: Invasive endometrioid carcinoma Grade: FIGO grade II Myometrial invasion: 1 cm where myometrium is 2.3 cm in thickness Cervical stromal involvement: No Extent of involvement of other organs: No Lymph vascular invasion: Not identified Peritoneal washings: Negative ([id number]) Lymph nodes: number examined N/A; number positive N/A TNM code: pT1a, pNX 1 oFf 3IGO Stage (based on pathologic findings, needs clinical correlation): IA  Comments: Sections the endomyometrium away from the grossly identified endometrial polyp show an invasive FIGO grade II endometrioid carcinoma. The tumor is confined within inner half of the myometrium. No angiolymphatic invasion is identified. No cervical stromal involvement is identified. Sections of the grossly identified endometrial polyp show an endometrial polyp with associated atypical compacted hyperplasia with no definitive evidence of carcinoma.     12/07/2017  Imaging    US venous Doppler Right: No evidence of common femoral vein obstruction. Left: Findings consistent with acute deep vein thrombosis involving the left femoral vein, left proximal profunda vein, and left popliteal vein. Unable to adequately interrogate the common femoral and higher, or the calf secondary to significant edema and body habitus    12/07/2017 [facility] Admission    She presented to the ER and was diagnosed with acute DVT    01/18/2018 - 01/21/2018 Hospital Admission    She was admitted to the hospital for management of severe persistent DVT    01/18/2018 Imaging    US venous Doppler Right: No evidence of common femoral vein obstruction. Left: Findings consistent with acute deep vein thrombosis involving the left common femoral vein, and left popliteal vein.    01/19/2018 Surgery    Pre-operative Diagnosis: Subacute DVT with severe post thrombotic syndrome Post-operative diagnosis:  Same Surgeon:  Erlene Quan C. Donzetta Matters, MD Procedure Performed: 1.  Ultrasound-guided cannulation left small saphenous vein 2.  Left lower extremity and central venography 3.  Intravascular ultrasound of left popliteal, femoral, common femoral, external and common iliac veins and IVC 4.  Stent of left common and external iliac veins with 14 x 60 mm Vici 5.  Moderate sedation with fentanyl and Versed for 50 minutes  Indications: 73 year old female with a history of DVT in October now presents with persistent left lower extremity swelling and ultrasound demonstrating likely persistent DVT.  She has been on Xarelto at this time.  She is now indicated for venogram possible intervention.  Findings: Flow in the left lower extremity was stagnant throughout but by venogram all veins were patent.  There was a focal occlusive area approximately 2 cm in length at the common and external iliac vein junction at the hypogastric on the left.  After stenting and ballooning we had a diameter of 12  millimeters in the stent and venogram demonstrated flow in the lower extremity veins were previously was stagnant and no further residual stenosis in the left common and external iliac vein junction.    04/12/2018 Imaging    US Venous Doppler Right: No evidence of common femoral vein obstruction. Left: There is no evidence of deep vein thrombosis in the lower extremity. However, portions of this examination were limited- see technologist comments above. Left groin: Large hypoechoic area with mixed echoes noted measuring nearly 10 cm. Possible  hematoma versus unknown etiology. Ultrasound characteristics of enlarged lymph nodes noted in the groin.       05/15/2018 Imaging    US Venous Doppler Right: No evidence of deep vein thrombosis in the lower extremity. No indirect evidence of obstruction proximal to the inguinal ligament. Left: No reflux was noted in the common femoral vein , femoral vein in the thigh, popliteal vein, great saphenous vein at the saphenofemoral junction, great saphenous vein at the proximal thigh, great saphenous vein at the mid thigh, great saphenous vein  at the distal thigh, great saphenous vein at the knee, origin of the small saphenous vein, proximal small saphenous vein, and mid small saphenous vein. There is no evidence of deep vein thrombosis in the lower extremity. There is no evidence of superficial venous thrombosis. No cystic structure found in the popliteal fossa. Unable to evaluate extension of common femoral vein obstruction proximal to the inguinal ligament.    06/01/2018 Imaging    1. Infiltrative mass within the left pelvic sidewall measuring approximately 9.5 cm with associated pathologically enlarged left inguinal lymph  node. Additionally, there is lucency involving the medial sidewall of the left acetabulum with potential nondisplaced pathologic fracture. Further evaluation with contrast-enhanced pelvic MRI could be performed as clinically indicated. 2. The  left pelvic arterial and venous system is encased by this infiltrative left pelvic sidewall mass however while difficult to ascertain, the left external iliac venous stent appears patent.    06/18/2018 Pathology Results    Lymph node for lymphoma, Left Inguinal - METASTATIC ADENOCARCINOMA, SEE COMMENT. Microscopic Comment Immunohistochemistry is positive for cytokeratin 7, PAX8, ER, and PR. Cytokeratin 5/6,and p63 are negative. The immunoprofile along with the patient's history are consistent with a gynecologic primary.    06/18/2018 Surgery    Pre-op Diagnosis: INGUINAL LYMPHADENOPATHY, PELVIC MASS     Procedure(s): EXCISIONAL BIOPSY DEEP LEFT INGUINAL LYMPH NODE  Surgeon(s): Coralie Keens, MD     06/24/2018 Cancer Staging    Staging form: Corpus Uteri - Carcinoma and Carcinosarcoma, AJCC 8th Edition - Clinical: Stage IVB (cT1a, cN2, pM1) - Signed by [name], MD on 06/24/2018     Genetic Testing    Patient has genetic testing done for MMR on pathology from 06/18/2018. Results revealed patient has the following mutation(s): MMR: abnormal    06/29/2018 Procedure    Placement of a subcutaneous port device. Catheter tip at the SVC and right atrium junction.     Genetic Testing    Patient has genetic testing done for MSI on pathology from 06/18/2018. Results revealed patient has the following mutation(s): MSI: High    07/02/2018 PET scan    Previous hysterectomy, with asymmetric focus of hypermetabolic activity in the left vaginal cuff, suspicious for residual or recurrent carcinoma.  Large hypermetabolic soft tissue mass involving the left pelvic sidewall and acetabulum, consistent with metastatic disease.  No evidence metastatic disease within the abdomen, chest, or neck.    07/09/2018 Tumor Marker    Patient's tumor was tested for the following markers: CA-125 Results of the tumor marker test revealed 9    07/10/2018 -  Chemotherapy    The patient had carboplatin and  taxol    07/17/2018 Genetic Testing    Negative genetic testing on the common hereditary cancer panel.  The Common Hereditary Gene Panel offered by [facility] includes sequencing and/or deletion duplication testing of the following 48 genes: APC, ATM, AXIN2, BARD1, BMPR1A, BRCA1, BRCA2, BRIP1, CDH1, CDK4, CDKN2A (p14ARF), CDKN2A (p16INK4a), CHEK2, CTNNA1, DICER1, EPCAM (Deletion/duplication testing only), GREM1 (promoter region deletion/duplication testing only), KIT, MEN1, MLH1, MSH2, MSH3, MSH6, MUTYH, NBN, NF1, NHTL1, PALB2, PDGFRA, PMS2, POLD1, POLE, PTEN, RAD50, RAD51C, RAD51D, RNF43, SDHB, SDHC, SDHD, SMAD4, SMARCA4. STK11, TP53, TSC1, TSC2, and VHL.  The following genes were evaluated for sequence changes only: SDHA and HOXB13 c.251G>A variant only. The report date is Jul 17, 2018.      Metastasis to lymph nodes ([place])   06/23/2018 Initial Diagnosis    Metastasis to lymph nodes ([place])    07/10/2018 -  Chemotherapy    The patient had palonosetron (ALOXI) injection 0.25 mg, 0.25 mg, Intravenous,  Once, 1 of 6 cycles Administration: 0.25 mg (07/10/2018) CARBOplatin (PARAPLATIN) 480 mg in sodium chloride 0.9 % 250 mL chemo infusion, 480 mg (100 % of original dose 482.5 mg), Intravenous,  Once, 1 of 6 cycles Dose modification: 482.5 mg (original dose 482.5 mg, Cycle 1) Administration: 480 mg (07/10/2018) PACLitaxel (TAXOL) 276 mg in sodium chloride 0.9 % 250 mL chemo infusion (> 38m/m2), 140 mg/m2 = 276 mg (80 % of original dose 175  mg/m2), Intravenous,  Once, 1 of 6 cycles Dose modification: 140 mg/m2 (80 % of original dose 175 mg/m2, Cycle 1, Reason: Dose Not Tolerated) Administration: 276 mg (07/10/2018) fosaprepitant (EMEND) 150 mg, dexamethasone (DECADRON) 12 mg in sodium chloride 0.9 % 145 mL IVPB, , Intravenous,  Once, 1 of 6 cycles Administration:  (07/10/2018)  for chemotherapy treatment.      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 ([place])   06/24/2018 Initial Diagnosis    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 ([place])    07/10/2018 -   Chemotherapy    The patient had palonosetron (ALOXI) injection 0.25 mg, 0.25 mg, Intravenous,  Once, 1 of 6 cycles Administration: 0.25 mg (07/10/2018) CARBOplatin (PARAPLATIN) 480 mg in sodium chloride 0.9 % 250 mL chemo infusion, 480 mg (100 % of original dose 482.5 mg), Intravenous,  Once, 1 of 6 cycles Dose modification: 482.5 mg (original dose 482.5 mg, Cycle 1) Administration: 480 mg (07/10/2018) PACLitaxel (TAXOL) 276 mg in sodium chloride 0.9 % 250 mL chemo infusion (> 98m/m2), 140 mg/m2 = 276 mg (80 % of original dose 175 mg/m2), Intravenous,  Once, 1 of 6 cycles Dose modification: 140 mg/m2 (80 % of original dose 175 mg/m2, Cycle 1, Reason: Dose Not Tolerated) Administration: 276 mg (07/10/2018) fosaprepitant (EMEND) 150 mg, dexamethasone (DECADRON) 12 mg in sodium chloride 0.9 % 145 mL IVPB, , Intravenous,  Once, 1 of 6 cycles Administration:  (07/10/2018)  for chemotherapy treatment.      REVIEW OF SYSTEMS:   Constitutional: Denies fevers, chills or abnormal weight loss Eyes: Denies blurriness of vision Ears, nose, mouth, throat, and face: Denies mucositis or sore throat Respiratory: Denies cough, dyspnea or wheezes Cardiovascular: Denies palpitation, chest discomfort or lower extremity swelling Skin: Denies abnormal skin rashes Lymphatics: Denies new lymphadenopathy or easy bruising Neurological:Denies numbness, tingling or new weaknesses Behavioral/Psych: Mood is stable, no new changes  All other systems were reviewed with the patient and are negative.  I have reviewed the past medical history, past surgical history, social history and family history with the patient and they are unchanged from previous note.  ALLERGIES:  has No Known Allergies.  MEDICATIONS:  Current Outpatient Medications  Medication Sig Dispense Refill  . clopidogrel (PLAVIX) 75 MG tablet Take 1 tablet (75 mg total) by mouth daily with breakfast. 30 tablet 11  . cycloSPORINE (RESTASIS) 0.05 %  ophthalmic emulsion Place 1 drop into both eyes 2 (two) times daily.    .Marland Kitchendexamethasone (DECADRON) 4 MG tablet Take 2 tabs at the night before and 2 tabs the morning of chemotherapy, every 3 weeks, by mouth (Patient taking differently: Take 8 mg by mouth See admin instructions. Take 8 mg the night before chemo and 8 mg the morning of chemotherapy every 3 weeks) 24 tablet 0  . lidocaine-prilocaine (EMLA) cream Apply to affected area once 30 g 3  . morphine (MS CONTIN) 30 MG 12 hr tablet Take 1 tablet (30 mg total) by mouth every 12 (twelve) hours. (Patient taking differently: Take 30 mg by mouth every 12 (twelve) hours as needed for pain. ) 60 tablet 0  . morphine (MSIR) 15 MG tablet Take 1 tablet (15 mg total) by mouth every 6 (six) hours as needed for severe pain. 60 tablet 0  . Olopatadine HCl 0.2 % SOLN Place 1 drop into both eyes daily.    . ondansetron (ZOFRAN ODT) 4 MG disintegrating tablet 459mODT q4 hours prn nausea/vomit 20 tablet 0  . ondansetron (ZOFRAN) 8 MG tablet Take 1 tablet (8 mg total)  by mouth every 8 (eight) hours as needed. (Patient taking differently: Take 8 mg by mouth every 8 (eight) hours as needed for nausea or vomiting. ) 30 tablet 1  . polyethylene glycol (MIRALAX / GLYCOLAX) 17 g packet Take 17 g by mouth daily. (Patient not taking: Reported on 07/19/2018) 30 each 11  . potassium chloride SA (K-DUR) 20 MEQ tablet Take 1 tablet (20 mEq total) by mouth daily. 7 tablet 0  . prochlorperazine (COMPAZINE) 10 MG tablet Take 1 tablet (10 mg total) by mouth every 6 (six) hours as needed (Nausea or vomiting). (Patient taking differently: Take 10 mg by mouth every 6 (six) hours as needed for nausea or vomiting. ) 30 tablet 1  . rivaroxaban (XARELTO) 20 MG TABS tablet Take 1 tablet (20 mg total) by mouth daily with breakfast. 30 tablet 11   No current facility-administered medications for this visit.     PHYSICAL EXAMINATION: ECOG PERFORMANCE STATUS: 2 - Symptomatic, <50% confined to  bed  Vitals:   07/21/18 1245  BP: 110/61  Pulse: (!) 104  Resp: 18  Temp: 99.2 F (37.3 C)  SpO2: 100%   Filed Weights   07/21/18 1245  Weight: 222 lb 3.2 oz (100.8 kg)    GENERAL:alert, no distress and comfortable Musculoskeletal:no cyanosis of digits and no clubbing  NEURO: alert & oriented x 3 with fluent speech, no focal motor/sensory deficits  LABORATORY DATA:  I have reviewed the data as listed    Component Value Date/Time   NA 137 07/21/2018 1227   K 3.2 (L) 07/21/2018 1227   CL 99 07/21/2018 1227   CO2 28 07/21/2018 1227   GLUCOSE 103 (H) 07/21/2018 1227   BUN 8 07/21/2018 1227   CREATININE 0.78 07/21/2018 1227   CALCIUM 8.8 (L) 07/21/2018 1227   PROT 7.1 07/21/2018 1227   ALBUMIN 3.0 (L) 07/21/2018 1227   AST 18 07/21/2018 1227   ALT 20 07/21/2018 1227   ALKPHOS 79 07/21/2018 1227   BILITOT 0.6 07/21/2018 1227   GFRNONAA >60 07/21/2018 1227   GFRAA >60 07/21/2018 1227    No results found for: SPEP, UPEP  Lab Results  Component Value Date   WBC 3.9 (L) 07/21/2018   NEUTROABS 2.6 07/21/2018   HGB 9.3 (L) 07/21/2018   HCT 30.1 (L) 07/21/2018   MCV 86.7 07/21/2018   PLT 162 07/21/2018      Chemistry      Component Value Date/Time   NA 137 07/21/2018 1227   K 3.2 (L) 07/21/2018 1227   CL 99 07/21/2018 1227   CO2 28 07/21/2018 1227   BUN 8 07/21/2018 1227   CREATININE 0.78 07/21/2018 1227      Component Value Date/Time   CALCIUM 8.8 (L) 07/21/2018 1227   ALKPHOS 79 07/21/2018 1227   AST 18 07/21/2018 1227   ALT 20 07/21/2018 1227   BILITOT 0.6 07/21/2018 1227       RADIOGRAPHIC STUDIES: I have personally reviewed the radiological images as listed and agreed with the findings in the report. Nm Pet Image Initial (pi) Skull Base To Thigh  Result Date: 07/02/2018 CLINICAL DATA:  Initial treatment strategy for uterine carcinoma. EXAM: NUCLEAR MEDICINE PET SKULL BASE TO THIGH TECHNIQUE: 9.4 mCi F-18 FDG was injected intravenously. Full-ring  PET imaging was performed from the skull base to thigh after the radiotracer. CT data was obtained and used for attenuation correction and anatomic localization. Fasting blood glucose: 97 mg/dl COMPARISON:  AP CT on 06/01/2018 FINDINGS: Mediastinal blood-pool  activity (reference): SUV max = 2.4 Liver activity (reference): SUV max = N/A NECK:  No hypermetabolic lymph nodes or masses. Incidental CT findings:  None. CHEST: No hypermetabolic masses or lymphadenopathy. No suspicious pulmonary nodules seen on CT images. Incidental CT findings:  None. ABDOMEN/PELVIS: No abnormal hypermetabolic activity within the liver, pancreas, adrenal glands, or spleen. No hypermetabolic lymph nodes within the abdomen. Stent seen in the left common iliac and proximal external iliac veins. Large left pelvic wall soft tissue mass is seen which encases this stent, and involves the left acetabulum. This measures 9.3 x 6.4 cm on image 136/4, mildly increased from 8.5 by 5.8 cm previously. This has SUV max of 17.4. Prior hysterectomy. Asymmetric focal FDG uptake is seen in the left vaginal cuff with SUV max of 7.9 mm. Incidental CT findings:  None. SKELETON: No focal hypermetabolic bone lesions to suggest skeletal metastasis. Incidental CT findings:  None. IMPRESSION: Previous hysterectomy, with asymmetric focus of hypermetabolic activity in the left vaginal cuff, suspicious for residual or recurrent carcinoma. Large hypermetabolic soft tissue mass involving the left pelvic sidewall and acetabulum, consistent with metastatic disease. No evidence metastatic disease within the abdomen, chest, or neck. Electronically Signed   By: Earle Gell M.D.   On: 07/02/2018 15:15   Dg Abd 2 Views  Result Date: 07/15/2018 CLINICAL DATA:  73 year old female with a history of cancer and abdominal pain. EXAM: ABDOMEN - 2 VIEW COMPARISON:  PET CT 07/02/2018 FINDINGS: Gas within stomach small bowel and colon. No abnormal distension. No air-fluid levels.  Cholecystectomy. No unexpected radiopaque foreign body. No unexpected calcification. Stent of the left iliac system is unchanged. Irregularity along the left pectineal line of the pelvis, in the region of known tumor and pathologic fracture. No displaced fracture. Surgical clips projecting over the left hemipelvis. Degenerative changes of the bilateral hips. IMPRESSION: Nonobstructive bowel gas pattern. Irregularity along the left pectineal line at the acetabulum, in the region of known tumor involvement and pathologic fracture. No displaced fracture identified. Vascular stent of the left iliac system. Electronically Signed   By: Corrie Mckusick D.O.   On: 07/15/2018 13:50   Ir Imaging Guided Port Insertion  Result Date: 06/29/2018 INDICATION: 73 year old with uterine cancer.  Port-A-Cath needed for treatment. EXAM: FLUOROSCOPIC AND ULTRASOUND GUIDED PLACEMENT OF A SUBCUTANEOUS PORT COMPARISON:  None. MEDICATIONS: Ancef 2 g; The antibiotic was administered within an appropriate time interval prior to skin puncture. ANESTHESIA/SEDATION: Versed 4.0 mg IV; Fentanyl 200 mcg IV; Moderate Sedation Time:  58 minutes The patient was continuously monitored during the procedure by the interventional radiology nurse under my direct supervision. FLUOROSCOPY TIME:  36 seconds, 16 mGy COMPLICATIONS: None immediate. PROCEDURE: The procedure, risks, benefits, and alternatives were explained to the patient. Questions regarding the procedure were encouraged and answered. The patient understands and consents to the procedure. Patient was placed supine on the interventional table. Ultrasound confirmed a patent right internal jugular vein. The right chest and neck were cleaned with a skin antiseptic and a sterile drape was placed. Maximal barrier sterile technique was utilized including caps, mask, sterile gowns, sterile gloves, sterile drape, hand hygiene and skin antiseptic. The right neck was anesthetized with 1% lidocaine. Small  incision was made in the right neck with a blade. Micropuncture set was placed in the right internal jugular vein with ultrasound guidance. The micropuncture wire was used for measurement purposes. The right chest was anesthetized with 1% lidocaine with epinephrine. #15 blade was used to make an incision and a  subcutaneous port pocket was formed. [place] was assembled. Subcutaneous tunnel was formed with a stiff tunneling device. The port catheter was brought through the subcutaneous tunnel. The port was placed in the subcutaneous pocket and sutured in place. The micropuncture set was exchanged for a peel-away sheath. The catheter was placed through the peel-away sheath and the tip was positioned at the SVC and right atrium junction. Catheter placement was confirmed with fluoroscopy. The port was accessed and flushed with heparinized saline. The port pocket was closed using two layers of absorbable sutures and Dermabond. The vein skin site was closed using a single layer of absorbable suture and Dermabond. Sterile dressings were applied. Patient tolerated the procedure well without an immediate complication. Ultrasound and fluoroscopic images were taken and saved for this procedure. IMPRESSION: Placement of a subcutaneous port device. Catheter tip at the SVC and right atrium junction. Electronically Signed   By: Markus Daft M.D.   On: 06/29/2018 15:59    All questions were answered. The patient knows to call the clinic with any problems, questions or concerns. No barriers to learning was detected.  I spent 15 minutes counseling the patient face to face. The total time spent in the appointment was 20 minutes and more than 50% was on counseling and review of test results  [name], MD 07/21/2018 2:17 PM

## 2018-07-21 NOTE — Assessment & Plan Note (Signed)
Her pain control is excellent With her next refill, I plan to reduce MS Contin back to 15 mg with eventual taper.  She agreed with the plan

## 2018-07-21 NOTE — Progress Notes (Signed)
HPI:  [name] was previously seen in the [place] clinic due to a personal and family history of cancer and concerns regarding a hereditary predisposition to cancer. Please refer to our prior cancer genetics clinic note for more information regarding our discussion, assessment and recommendations, at the time. [name]'s recent genetic test results were disclosed to her, as were recommendations warranted by these results. These results and recommendations are discussed in more detail below.  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 Uterus +/- tubes/ovaries, neoplastic, with left fallopian tube and ovary - INVASIVE ENDOMETRIOID CARCINOMA (1.5 CM), FIGO GRADE II, ARISING IN A BACKGROUND OF ATYPICAL COMPLEX HYPERPLASIA, CONFINED WITHIN INNER HALF OF THE MYOMETRIUM. - ENDOMETRIAL POLYP WITH ASSOCIATED ATYPICAL COMPLEX HYPERPLASIA. - MYOMETRIUM: LEIOMYOMATA. - CERVIX: BENIGN SQUAMOUS MUCOSA AND ENDOCERVICAL MUCOSA, NO DYSPLASIA OR MALIGNANCY. - LEFT OVARY: BENIGN OVARIAN TISSUE WITH ENDOSALPINGOSIS, NO EVIDENCE OF ATYPIA OR MALIGNANCY. - LEFT FALLOPIAN TUBE: NO HISTOLOGIC ABNORMALITIES. - PLEASE SEE ONCOLOGY TEMPLATE FOR DETAIL. 2. Ovary and fallopian tube, right - BENIGN OVARIAN TISSUE WITH ENDOSALPINGOSIS, NO ATYPIA OR MALIGNANCY. - BENIGN FALLOPIAN TUBAL TISSUE, NO PATHOLOGIC ABNORMALITIES. Microscopic Comment 1. UTERUS Specimen: Uterus, cervix, bilateral ovaries and fallopian tubes Procedure: Total hysterectomy and bilateral salpingo-oophorectomy Lymph node sampling performed: No Specimen integrity: Intact Maximum tumor size (cm): 1.5 cm, glass slide measurement Histologic type: Invasive endometrioid carcinoma Grade: FIGO grade II Myometrial invasion: 1 cm where myometrium is 2.3 cm in thickness  Cervical stromal involvement: No Extent of involvement of other organs: No Lymph vascular invasion: Not identified Peritoneal washings: Negative ([id number]) Lymph nodes: number examined N/A; number positive N/A TNM code: pT1a, pNX 1 oFf 3IGO Stage (based on pathologic findings, needs clinical correlation): IA  Comments: Sections the endomyometrium away from the grossly identified endometrial polyp show an invasive FIGO grade II endometrioid carcinoma. The tumor is confined within inner half of the myometrium. No angiolymphatic invasion is identified. No cervical stromal involvement is identified. Sections of the grossly identified endometrial polyp show an endometrial polyp with associated atypical compacted hyperplasia with no definitive evidence of carcinoma.    12/07/2017 Imaging    US venous Doppler Right: No evidence of common femoral vein obstruction. Left: Findings consistent with acute deep vein thrombosis involving the left femoral vein, left proximal profunda vein, and left popliteal vein. Unable to adequately interrogate the common femoral and higher, or the calf secondary to significant edema and body habitus    12/07/2017 [facility] Admission    She presented to the ER and was diagnosed with acute DVT    01/18/2018 - 01/21/2018 Hospital Admission    She was admitted to the hospital for management of severe persistent DVT    01/18/2018 Imaging    US venous Doppler Right: No evidence of common femoral vein obstruction. Left: Findings consistent with acute deep vein thrombosis involving the left common femoral vein, and left popliteal vein.    01/19/2018 Surgery    Pre-operative Diagnosis: Subacute DVT with severe post thrombotic syndrome Post-operative diagnosis:  Same Surgeon:  Erlene Quan C. Donzetta Matters, MD Procedure Performed: 1.  Ultrasound-guided cannulation left small saphenous vein 2.  Left lower extremity and central venography 3.  Intravascular ultrasound of left popliteal,  femoral, common femoral, external and common iliac veins and IVC 4.  Stent of left common and external iliac veins with 14 x 60 mm Vici 5.  Moderate sedation with fentanyl and Versed for 50 minutes  Indications: 73 year old female with a history of DVT in October now presents with persistent left lower extremity swelling and ultrasound demonstrating likely persistent DVT.  She has been on Xarelto at this time.  She is now indicated for venogram possible intervention.  Findings: Flow in the left lower extremity was stagnant throughout but by venogram all veins were patent.  There was a focal occlusive area approximately 2 cm in length at the common and external iliac vein junction at the hypogastric on the left.  After stenting and ballooning we had a diameter of 12 millimeters in the stent and venogram demonstrated flow in the lower extremity veins were previously was stagnant and no further residual stenosis in the left common and external iliac vein junction.    04/12/2018 Imaging    US Venous Doppler Right: No evidence of common femoral vein obstruction. Left: There is no evidence of deep vein thrombosis in the lower extremity. However, portions of this examination were limited- see technologist comments above. Left groin: Large hypoechoic area with mixed echoes noted measuring nearly 10 cm. Possible  hematoma versus unknown etiology. Ultrasound characteristics of enlarged lymph nodes noted in the groin.       05/15/2018 Imaging    US Venous Doppler Right: No evidence of deep vein thrombosis in the lower extremity. No indirect evidence of obstruction proximal to the inguinal ligament. Left: No reflux was noted in the common femoral vein , femoral vein in the thigh, popliteal vein, great saphenous vein at the saphenofemoral junction, great saphenous vein at the proximal thigh, great saphenous vein at the mid thigh, great saphenous vein  at the distal thigh, great saphenous vein at the knee,  origin of the small saphenous vein, proximal small saphenous vein, and mid small saphenous vein. There is no evidence of deep vein thrombosis in the lower extremity. There is no evidence of superficial venous thrombosis. No cystic structure found in the popliteal fossa. Unable to evaluate extension of common femoral vein obstruction proximal to the inguinal ligament.    06/01/2018 Imaging    1. 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 Additionally, there is lucency involving the medial sidewall of the left acetabulum with potential nondisplaced pathologic fracture. Further evaluation with contrast-enhanced pelvic MRI could be performed as clinically indicated. 2. The left pelvic arterial and venous system is encased by this infiltrative left pelvic sidewall mass however while difficult to ascertain, the left external iliac venous stent appears patent.    06/18/2018 Pathology Results    Lymph node for lymphoma, Left Inguinal - METASTATIC ADENOCARCINOMA, SEE COMMENT. Microscopic Comment Immunohistochemistry is positive for cytokeratin 7, PAX8, ER, and PR. Cytokeratin 5/6,and p63 are negative. The immunoprofile along with the patient's history are consistent with a gynecologic primary.    06/18/2018 Surgery    Pre-op Diagnosis: INGUINAL LYMPHADENOPATHY, PELVIC MASS     Procedure(s): EXCISIONAL BIOPSY DEEP LEFT INGUINAL LYMPH NODE  Surgeon(s): Coralie Keens, MD     06/24/2018 Cancer Staging    Staging form: Corpus Uteri - Carcinoma and Carcinosarcoma, AJCC 8th Edition - Clinical: Stage IVB (cT1a, cN2, pM1) - Signed by [name], MD on 06/24/2018     Genetic Testing    Patient has genetic testing done for MMR on pathology from 06/18/2018. Results revealed patient has  the following mutation(s): MMR: abnormal    06/29/2018 Procedure    Placement of a subcutaneous port device. Catheter  tip at the SVC and right atrium junction.     Genetic Testing    Patient has genetic testing done for MSI on pathology from 06/18/2018. Results revealed patient has the following mutation(s): MSI: High    07/02/2018 PET scan    Previous hysterectomy, with asymmetric focus of hypermetabolic activity in the left vaginal cuff, suspicious for residual or recurrent carcinoma.  Large hypermetabolic soft tissue mass involving the left pelvic sidewall and acetabulum, consistent with metastatic disease.  No evidence metastatic disease within the abdomen, chest, or neck.    07/09/2018 Tumor Marker    Patient's tumor was tested for the following markers: CA-125 Results of the tumor marker test revealed 9    07/10/2018 -  Chemotherapy    The patient had carboplatin and taxol    07/17/2018 Genetic Testing    Negative genetic testing on the common hereditary cancer panel.  The Common Hereditary Gene Panel offered by [facility] includes sequencing and/or deletion duplication testing of the following 48 genes: APC, ATM, AXIN2, BARD1, BMPR1A, BRCA1, BRCA2, BRIP1, CDH1, CDK4, CDKN2A (p14ARF), CDKN2A (p16INK4a), CHEK2, CTNNA1, DICER1, EPCAM (Deletion/duplication testing only), GREM1 (promoter region deletion/duplication testing only), KIT, MEN1, MLH1, MSH2, MSH3, MSH6, MUTYH, NBN, NF1, NHTL1, PALB2, PDGFRA, PMS2, POLD1, POLE, PTEN, RAD50, RAD51C, RAD51D, RNF43, SDHB, SDHC, SDHD, SMAD4, SMARCA4. STK11, TP53, TSC1, TSC2, and VHL.  The following genes were evaluated for sequence changes only: SDHA and HOXB13 c.251G>A variant only. The report date is Jul 17, 2018.      Metastasis to lymph nodes ([place])   06/23/2018 Initial Diagnosis    Metastasis to lymph nodes ([place])    07/10/2018 -  Chemotherapy    The patient had palonosetron (ALOXI) injection 0.25 mg, 0.25 mg, Intravenous,  Once, 1 of 6 cycles Administration: 0.25 mg (07/10/2018) CARBOplatin (PARAPLATIN) 480 mg in sodium chloride 0.9 % 250 mL chemo infusion, 480 mg  (100 % of original dose 482.5 mg), Intravenous,  Once, 1 of 6 cycles Dose modification: 482.5 mg (original dose 482.5 mg, Cycle 1) Administration: 480 mg (07/10/2018) PACLitaxel (TAXOL) 276 mg in sodium chloride 0.9 % 250 mL chemo infusion (> 44m/m2), 140 mg/m2 = 276 mg (80 % of original dose 175 mg/m2), Intravenous,  Once, 1 of 6 cycles Dose modification: 140 mg/m2 (80 % of original dose 175 mg/m2, Cycle 1, Reason: Dose Not Tolerated) Administration: 276 mg (07/10/2018) fosaprepitant (EMEND) 150 mg, dexamethasone (DECADRON) 12 mg in sodium chloride 0.9 % 145 mL IVPB, , Intravenous,  Once, 1 of 6 cycles Administration:  (07/10/2018)  for chemotherapy treatment.      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 [place]   06/24/2018 Initial Diagnosis    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    07/10/2018 -  Chemotherapy    The patient had palonosetron (ALOXI) injection 0.25 mg, 0.25 mg, Intravenous,  Once, 1 of 6 cycles Administration: 0.25 mg (07/10/2018) CARBOplatin (PARAPLATIN) 480 mg in sodium chloride 0.9 % 250 mL chemo infusion, 480 mg (100 % of original dose 482.5 mg), Intravenous,  Once, 1 of 6 cycles Dose modification: 482.5 mg (original dose 482.5 mg, Cycle 1) Administration: 480 mg (07/10/2018) PACLitaxel (TAXOL) 276 mg in sodium chloride 0.9 % 250 mL chemo infusion (> 831mm2), 140 mg/m2 = 276 mg (80 % of original dose 175 mg/m2), Intravenous,  Once, 1 of 6 cycles Dose modification: 140 mg/m2 (80 % of original dose 175 mg/m2, Cycle  1, Reason: Dose Not Tolerated) Administration: 276 mg (07/10/2018) fosaprepitant (EMEND) 150 mg, dexamethasone (DECADRON) 12 mg in sodium chloride 0.9 % 145 mL IVPB, , Intravenous,  Once, 1 of 6 cycles Administration:  (07/10/2018)  for chemotherapy treatment.      FAMILY HISTORY:  We obtained a detailed, 4-generation family history.  Significant diagnoses are listed below: Family History  Problem Relation Age of Onset  . Arthritis Mother   . Hypertension Mother   . Alzheimer's disease Father    . Diabetes Sister   . Hypertension Sister   . Hypertension Brother   . Stroke Brother   . Hypertension Brother   . Hypertension Sister   . Hypertension Sister   . Hypertension Sister   . Breast cancer Other        Niece  . Breast cancer Niece 68       sister's daughter  . Anesthesia problems Neg Hx   . Hypotension Neg Hx   . Malignant hyperthermia Neg Hx   . Pseudochol deficiency Neg Hx   . Colon cancer Neg Hx     The patient has one son who is cancer free. She has two brothers and four sisters who are cancer free.  One sister had a daughter who has breast cancer in her early 79's.  Both parents are deceased.  The patient's mother had arthritis and high blood pressure.  She had 6-7 siblings, none reported to have cancer.  The maternal grandparents died of non-cancer related issues.  The patient's father died from complications of dementia.  He had five siblings who died from non cancer related issues.  The paternal grandparents are deceased from non-cancer related issues.  Ms. Ytuarte is unaware of previous family history of genetic testing for hereditary cancer risks. Patient's maternal ancestors are of African American descent, and paternal ancestors are of African American descent. There is no reported Ashkenazi Jewish ancestry. There is no known consanguinity.    GENETIC TEST RESULTS: Genetic testing reported out on Jul 17, 2018 through the common hereditary cancer panel found no pathogenic mutations. The Common Hereditary Gene Panel offered by [facility] includes sequencing and/or deletion duplication testing of the following 48 genes: APC, ATM, AXIN2, BARD1, BMPR1A, BRCA1, BRCA2, BRIP1, CDH1, CDK4, CDKN2A (p14ARF), CDKN2A (p16INK4a), CHEK2, CTNNA1, DICER1, EPCAM (Deletion/duplication testing only), GREM1 (promoter region deletion/duplication testing only), KIT, MEN1, MLH1, MSH2, MSH3, MSH6, MUTYH, NBN, NF1, NHTL1, PALB2, PDGFRA, PMS2, POLD1, POLE, PTEN, RAD50, RAD51C, RAD51D,  RNF43, SDHB, SDHC, SDHD, SMAD4, SMARCA4. STK11, TP53, TSC1, TSC2, and VHL.  The following genes were evaluated for sequence changes only: SDHA and HOXB13 c.251G>A variant only. The test report has been scanned into EPIC and is located under the Molecular Pathology section of the Results Review tab.  A portion of the result report is included below for reference.     We discussed with Ms. Neeb that because current genetic testing is not perfect, it is possible there may be a gene mutation in one of these genes that current testing cannot detect, but that chance is small.  We also discussed, that there could be another gene that has not yet been discovered, or that we have not yet tested, that is responsible for the cancer diagnoses in the family. It is also possible there is a hereditary cause for the cancer in the family that Ms. Zapata did not inherit and therefore was not identified in her testing.  Therefore, it is important to remain in touch with cancer genetics in  the future so that we can continue to offer Ms. Stettler the most up to date genetic testing.   ADDITIONAL GENETIC TESTING: We discussed with Ms. Branaman that there are other genes that are associated with increased cancer risk that can be analyzed. Should Ms. Rosselli wish to pursue additional genetic testing, we are happy to discuss and coordinate this testing, at any time.     CANCER SCREENING RECOMMENDATIONS: Ms. Maser's test result is considered negative (normal).  This means that we have not identified a hereditary cause for her personal and family history of cancer at this time. Most cancers happen by chance and this negative test suggests that her cancer may fall into this category.    While reassuring, this does not definitively rule out a hereditary predisposition to cancer. It is still possible that there could be genetic mutations that are undetectable by current technology. There could be genetic mutations in genes that have not  been tested or identified to increase cancer risk.  Therefore, it is recommended she continue to follow the cancer management and screening guidelines provided by her oncology and primary healthcare provider.   An individual's cancer risk and medical management are not determined by genetic test results alone. Overall cancer risk assessment incorporates additional factors, including personal medical history, family history, and any available genetic information that may result in a personalized plan for cancer prevention and surveillance  RECOMMENDATIONS FOR FAMILY MEMBERS:  Individuals in this family might be at some increased risk of developing cancer, over the general population risk, simply due to the family history of cancer.  We recommended women in this family have a yearly mammogram beginning at age 88, or 52 years younger than the earliest onset of cancer, an annual clinical breast exam, and perform monthly breast self-exams. Women in this family should also have a gynecological exam as recommended by their primary provider. All family members should have a colonoscopy by age 27.  It is also possible there is a hereditary cause for the cancer in Ms. Gowell's family that she did not inherit and therefore was not identified in her.  Based on Ms. Cocuzza's family history, we recommended her niece, who was diagnosed with breast cancer in her early 9's, have genetic counseling and testing. Ms. Goodridge will let us know if we can be of any assistance in coordinating genetic counseling and/or testing for this family member.   FOLLOW-UP: Lastly, we discussed with Ms. Maestre that cancer genetics is a rapidly advancing field and it is possible that new genetic tests will be appropriate for her and/or her family members in the future. We encouraged her to remain in contact with cancer genetics on an annual basis so we can update her personal and family histories and let her know of advances in cancer genetics  that may benefit this family.   Our contact number was provided. Ms. Moseley's questions were answered to her satisfaction, and she knows she is welcome to call us at anytime with additional questions or concerns.   Roma Kayser, MS, [facility] Certified Genetic Counselor [email or web address]

## 2018-07-21 NOTE — Assessment & Plan Note (Signed)
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

## 2018-07-22 NOTE — Progress Notes (Signed)
  Radiation Oncology         [phone] ________________________________  Name: [name] MRN: [id number]  Date: 07/21/2018  DOB: Nov 25, 1945  End of Treatment Note  Diagnosis:   Recurrentgrade 2 endometrioid endometrial adenocarcinoma     Indication for treatment:  Palliative       Radiation treatment dates:   06/30/18-07/21/18  Site/dose:   Left pelvis; 35 Gy in 14 fractions of 2.5 Gy  Beams/energy:   Photon 3D; 6X, 15X  Narrative: The patient tolerated radiation treatment relatively well.     Towards the end of treatment, pt reported mild fatigue and some constipation. She was taking Imodium with relief. She denied N/V, dysuria, pain, vaginal or rectal bleeding, discharge, skin irritation, and bladder changes.  She did not develop any skin issues. Overall the pt was without complaints.   Plan: The patient has completed radiation treatment. The patient will return to radiation oncology clinic for routine followup in one month. I advised them to call or return sooner if they have any questions or concerns related to their recovery or treatment.  -----------------------------------  Blair Promise, PhD, MD  This document serves as a record of services personally performed by Gery Pray, MD. It was created on his behalf by Mary-Margaret Loma Messing, a trained medical scribe. The creation of this record is based on the scribe's personal observations and the provider's statements to them. This document has been checked and approved by the attending provider.

## 2018-07-27 ENCOUNTER — Other Ambulatory Visit: Payer: Self-pay

## 2018-07-27 ENCOUNTER — Ambulatory Visit
Admission: RE | Admit: 2018-07-27 | Discharge: 2018-07-27 | Disposition: A | Discharge status: Home / Self Care | Payer: Medicare Other | Source: Ambulatory Visit | Attending: Vascular Surgery | Admitting: Vascular Surgery

## 2018-07-27 DIAGNOSIS — I872 Venous insufficiency (chronic) (peripheral): Secondary | ICD-10-CM

## 2018-07-27 MED ORDER — IOPAMIDOL (ISOVUE-370) INJECTION 76%: 125.0000 mL | Freq: Once | INTRAVENOUS | Status: DC | PRN

## 2018-07-28 ENCOUNTER — Telehealth: Payer: Self-pay | Admitting: Vascular Surgery

## 2018-07-28 ENCOUNTER — Other Ambulatory Visit: Payer: Self-pay

## 2018-07-28 DIAGNOSIS — I87002 Postthrombotic syndrome without complications of left lower extremity: Secondary | ICD-10-CM

## 2018-07-28 NOTE — Telephone Encounter (Signed)
Patient went to have a CT Venogram yesterday but left the site before having it done.  She said she didn't remember Dr. Donzetta Matters wanting her to have one done.  I contacted patient and informed her that per Dr. Claretha Cooper Encounter note from 05/15/2018, he wanted to evaluate both the stent as well as the mass in her left groin.  Pt verbalized understanding.  She will contact the site to reschedule appointment.   Thurston Hole., LPN

## 2018-07-31 ENCOUNTER — Inpatient Hospital Stay (HOSPITAL_BASED_OUTPATIENT_CLINIC_OR_DEPARTMENT_OTHER): Payer: Medicare Other | Admitting: Hematology and Oncology

## 2018-07-31 ENCOUNTER — Inpatient Hospital Stay: Payer: Medicare Other

## 2018-07-31 ENCOUNTER — Other Ambulatory Visit: Payer: Self-pay

## 2018-07-31 ENCOUNTER — Ambulatory Visit: Payer: Medicare Other | Admitting: Vascular Surgery

## 2018-07-31 ENCOUNTER — Encounter: Payer: Self-pay | Admitting: Hematology and Oncology

## 2018-07-31 DIAGNOSIS — K5909 Other constipation: Secondary | ICD-10-CM

## 2018-07-31 DIAGNOSIS — C7951 Secondary malignant neoplasm of bone: Secondary | ICD-10-CM

## 2018-07-31 DIAGNOSIS — Z7189 Other specified counseling: Secondary | ICD-10-CM

## 2018-07-31 DIAGNOSIS — C55 Malignant neoplasm of uterus, part unspecified: Secondary | ICD-10-CM

## 2018-07-31 DIAGNOSIS — Z79899 Other long term (current) drug therapy: Secondary | ICD-10-CM

## 2018-07-31 DIAGNOSIS — C774 Secondary and unspecified malignant neoplasm of inguinal and lower limb lymph nodes: Secondary | ICD-10-CM

## 2018-07-31 DIAGNOSIS — C541 Malignant neoplasm of endometrium: Secondary | ICD-10-CM

## 2018-07-31 DIAGNOSIS — Z5111 Encounter for antineoplastic chemotherapy: Secondary | ICD-10-CM

## 2018-07-31 DIAGNOSIS — I82502 Chronic embolism and thrombosis of unspecified deep veins of left lower extremity: Secondary | ICD-10-CM

## 2018-07-31 DIAGNOSIS — G893 Neoplasm related pain (acute) (chronic): Secondary | ICD-10-CM

## 2018-07-31 DIAGNOSIS — D61818 Other pancytopenia: Secondary | ICD-10-CM

## 2018-07-31 DIAGNOSIS — Z7901 Long term (current) use of anticoagulants: Secondary | ICD-10-CM

## 2018-07-31 DIAGNOSIS — Z923 Personal history of irradiation: Secondary | ICD-10-CM

## 2018-07-31 LAB — CBC WITH DIFFERENTIAL (CANCER CENTER ONLY)
Abs Immature Granulocytes: 0.05 10*3/uL (ref 0.00–0.07)
Basophils Absolute: 0 10*3/uL (ref 0.0–0.1)
Basophils Relative: 0 %
Eosinophils Absolute: 0 10*3/uL (ref 0.0–0.5)
Eosinophils Relative: 0 %
HCT: 29.1 % — ABNORMAL LOW (ref 36.0–46.0)
Hemoglobin: 9.1 g/dL — ABNORMAL LOW (ref 12.0–15.0)
Immature Granulocytes: 2 %
Lymphocytes Relative: 21 %
Lymphs Abs: 0.7 10*3/uL (ref 0.7–4.0)
MCH: 27.4 pg (ref 26.0–34.0)
MCHC: 31.3 g/dL (ref 30.0–36.0)
MCV: 87.7 fL (ref 80.0–100.0)
Monocytes Absolute: 0.1 10*3/uL (ref 0.1–1.0)
Monocytes Relative: 2 %
Neutro Abs: 2.4 10*3/uL (ref 1.7–7.7)
Neutrophils Relative %: 75 %
Platelet Count: 213 10*3/uL (ref 150–400)
RBC: 3.32 MIL/uL — ABNORMAL LOW (ref 3.87–5.11)
RDW: 16.3 % — ABNORMAL HIGH (ref 11.5–15.5)
WBC Count: 3.2 10*3/uL — ABNORMAL LOW (ref 4.0–10.5)
nRBC: 0 % (ref 0.0–0.2)

## 2018-07-31 LAB — CMP (CANCER CENTER ONLY)
ALT: 14 U/L (ref 0–44)
AST: 16 U/L (ref 15–41)
Albumin: 3.4 g/dL — ABNORMAL LOW (ref 3.5–5.0)
Alkaline Phosphatase: 86 U/L (ref 38–126)
Anion gap: 13 (ref 5–15)
BUN: 11 mg/dL (ref 8–23)
CO2: 23 mmol/L (ref 22–32)
Calcium: 9.2 mg/dL (ref 8.9–10.3)
Chloride: 102 mmol/L (ref 98–111)
Creatinine: 0.73 mg/dL (ref 0.44–1.00)
GFR, Est AFR Am: >60 mL/min (ref >60)
GFR, Estimated: >60 mL/min (ref >60)
Glucose, Bld: 123 mg/dL — ABNORMAL HIGH (ref 70–99)
Potassium: 3.3 mmol/L — ABNORMAL LOW (ref 3.5–5.1)
Sodium: 138 mmol/L (ref 135–145)
Total Bilirubin: 0.4 mg/dL (ref 0.3–1.2)
Total Protein: 7.9 g/dL (ref 6.5–8.1)

## 2018-07-31 MED ORDER — SODIUM CHLORIDE 0.9 % IV SOLN
140.0000 mg/m2 | Freq: Once | INTRAVENOUS | Status: AC
  Administered 2018-07-31: 276 mg via INTRAVENOUS
  Filled 2018-07-31: qty 46

## 2018-07-31 MED ORDER — FAMOTIDINE IN NACL 20-0.9 MG/50ML-% IV SOLN
INTRAVENOUS | Status: AC
  Filled 2018-07-31: qty 50

## 2018-07-31 MED ORDER — PALONOSETRON HCL INJECTION 0.25 MG/5ML
INTRAVENOUS | Status: AC
  Filled 2018-07-31: qty 5

## 2018-07-31 MED ORDER — FAMOTIDINE IN NACL 20-0.9 MG/50ML-% IV SOLN
20.0000 mg | Freq: Once | INTRAVENOUS | Status: AC
  Administered 2018-07-31: 20 mg via INTRAVENOUS

## 2018-07-31 MED ORDER — PALONOSETRON HCL INJECTION 0.25 MG/5ML
0.2500 mg | Freq: Once | INTRAVENOUS | Status: AC
  Administered 2018-07-31: 0.25 mg via INTRAVENOUS

## 2018-07-31 MED ORDER — HEPARIN SOD (PORK) LOCK FLUSH 100 UNIT/ML IV SOLN
500.0000 [IU] | Freq: Once | INTRAVENOUS | Status: AC | PRN
  Administered 2018-07-31: 17:00:00 500 [IU]
  Filled 2018-07-31: qty 5

## 2018-07-31 MED ORDER — SODIUM CHLORIDE 0.9% FLUSH
10.0000 mL | INTRAVENOUS | Status: DC | PRN
  Administered 2018-07-31: 17:00:00 10 mL
  Filled 2018-07-31: qty 10

## 2018-07-31 MED ORDER — SODIUM CHLORIDE 0.9% FLUSH
10.0000 mL | Freq: Once | INTRAVENOUS | Status: AC
  Administered 2018-07-31: 10 mL
  Filled 2018-07-31: qty 10

## 2018-07-31 MED ORDER — DIPHENHYDRAMINE HCL 50 MG/ML IJ SOLN
INTRAMUSCULAR | Status: AC
  Filled 2018-07-31: qty 1

## 2018-07-31 MED ORDER — DIPHENHYDRAMINE HCL 50 MG/ML IJ SOLN
50.0000 mg | Freq: Once | INTRAMUSCULAR | Status: AC
  Administered 2018-07-31: 50 mg via INTRAVENOUS

## 2018-07-31 MED ORDER — SODIUM CHLORIDE 0.9 % IV SOLN
Freq: Once | INTRAVENOUS | Status: AC
  Administered 2018-07-31: 12:00:00 via INTRAVENOUS
  Filled 2018-07-31: qty 250

## 2018-07-31 MED ORDER — SODIUM CHLORIDE 0.9 % IV SOLN
Freq: Once | INTRAVENOUS | Status: AC
  Administered 2018-07-31: 13:00:00 via INTRAVENOUS
  Filled 2018-07-31: qty 5

## 2018-07-31 MED ORDER — SODIUM CHLORIDE 0.9 % IV SOLN
482.5000 mg | Freq: Once | INTRAVENOUS | Status: AC
  Administered 2018-07-31: 480 mg via INTRAVENOUS
  Filled 2018-07-31: qty 48

## 2018-07-31 NOTE — Assessment & Plan Note (Signed)
Her constipation has resolved We discussed the importance of regular laxative therapy

## 2018-07-31 NOTE — Assessment & Plan Note (Signed)
She has acquired pancytopenia from treatment She is not symptomatic Observe only

## 2018-07-31 NOTE — Assessment & Plan Note (Signed)
Her cancer pain is well controlled She will continue her current prescription long-acting pain medicine along with IR morphine as needed I plan to taper her pain medicine when she is due for refill

## 2018-07-31 NOTE — Patient Instructions (Signed)
[facility] Discharge Instructions for Patients Receiving Chemotherapy  Today you received the following chemotherapy agents Taxol, Carboplatin  To help prevent nausea and vomiting after your treatment, we encourage you to take your nausea medication: As directed by your MD   If you develop nausea and vomiting that is not controlled by your nausea medication, call the clinic.   BELOW ARE SYMPTOMS THAT SHOULD BE REPORTED IMMEDIATELY:  *FEVER GREATER THAN 100.5 F  *CHILLS WITH OR WITHOUT FEVER  NAUSEA AND VOMITING THAT IS NOT CONTROLLED WITH YOUR NAUSEA MEDICATION  *UNUSUAL SHORTNESS OF BREATH  *UNUSUAL BRUISING OR BLEEDING  TENDERNESS IN MOUTH AND THROAT WITH OR WITHOUT PRESENCE OF ULCERS  *URINARY PROBLEMS  *BOWEL PROBLEMS  UNUSUAL RASH Items with * indicate a potential emergency and should be followed up as soon as possible.  Feel free to call the clinic should you have any questions or concerns. The clinic phone number is [phone].  Please show the [facility] at check-in to the Emergency Department and triage nurse.

## 2018-07-31 NOTE — Assessment & Plan Note (Signed)
Overall, she tolerated treatment well except for recent pain and constipation which has subsequently resolved We will proceed with treatment without dose adjustment I recommend minimum 3 cycles of treatment before repeat imaging study, probably due around end of July

## 2018-07-31 NOTE — Progress Notes (Signed)
[facility] OFFICE PROGRESS NOTE  Patient Care Team: Nolene Ebbs, MD as PCP - General (Internal Medicine)  ASSESSMENT & PLAN:  Uterine cancer ([place]) Overall, she tolerated treatment well except for recent pain and constipation which has subsequently resolved We will proceed with treatment without dose adjustment I recommend minimum 3 cycles of treatment before repeat imaging study, probably due around end of July  Other constipation Her constipation has resolved We discussed the importance of regular laxative therapy  Pancytopenia, acquired ([place]) She has acquired pancytopenia from treatment She is not symptomatic Observe only  Cancer associated pain Her cancer pain is well controlled She will continue her current prescription long-acting pain medicine along with IR morphine as needed I plan to taper her pain medicine when she is due for refill   No orders of the defined types were placed in this encounter.   INTERVAL HISTORY: Please see below for problem oriented charting. 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 Uterus +/- tubes/ovaries, neoplastic, with left fallopian tube and ovary - INVASIVE ENDOMETRIOID CARCINOMA (1.5 CM), FIGO GRADE II, ARISING IN A BACKGROUND OF ATYPICAL COMPLEX HYPERPLASIA, CONFINED WITHIN INNER HALF OF THE MYOMETRIUM. - ENDOMETRIAL POLYP WITH ASSOCIATED ATYPICAL COMPLEX HYPERPLASIA. - MYOMETRIUM: LEIOMYOMATA. - CERVIX: BENIGN SQUAMOUS MUCOSA AND ENDOCERVICAL MUCOSA, NO DYSPLASIA OR MALIGNANCY. - LEFT OVARY: BENIGN OVARIAN TISSUE WITH  ENDOSALPINGOSIS, NO EVIDENCE OF ATYPIA OR MALIGNANCY. - LEFT FALLOPIAN TUBE: NO HISTOLOGIC ABNORMALITIES. - PLEASE SEE ONCOLOGY TEMPLATE FOR DETAIL. 2. Ovary and fallopian tube, right - BENIGN OVARIAN TISSUE WITH ENDOSALPINGOSIS, NO ATYPIA OR MALIGNANCY. - BENIGN FALLOPIAN TUBAL TISSUE, NO PATHOLOGIC ABNORMALITIES. Microscopic Comment 1. UTERUS Specimen: Uterus, cervix, bilateral ovaries and fallopian tubes Procedure: Total hysterectomy and bilateral salpingo-oophorectomy Lymph node sampling performed: No Specimen integrity: Intact Maximum tumor size (cm): 1.5 cm, glass slide measurement Histologic type: Invasive endometrioid carcinoma Grade: FIGO grade II Myometrial invasion: 1 cm where myometrium is 2.3 cm in thickness Cervical stromal involvement: No Extent of involvement of other organs: No Lymph vascular invasion: Not identified Peritoneal washings: Negative ([id number]) Lymph nodes: number examined N/A; number positive N/A TNM code: pT1a, pNX 1 oFf 3IGO Stage (based on pathologic findings, needs clinical correlation): IA  Comments: Sections the endomyometrium away from the grossly identified endometrial polyp show an invasive FIGO grade II endometrioid carcinoma. The tumor is confined within inner half of the myometrium. No angiolymphatic invasion is identified. No cervical stromal involvement is identified. Sections of the grossly identified endometrial polyp show an endometrial polyp with associated atypical compacted hyperplasia with no definitive evidence of carcinoma.   12/07/2017 Imaging   US venous Doppler Right: No evidence of common femoral vein obstruction. Left: Findings consistent with acute deep vein thrombosis involving the left femoral vein, left proximal profunda vein, and left popliteal vein. Unable to adequately interrogate the common femoral and higher, or the calf secondary to significant edema and body habitus   12/07/2017 [facility] Admission   She  presented to the ER and was diagnosed with acute DVT   01/18/2018 - 01/21/2018 Hospital Admission   She was admitted to the hospital for management of severe  persistent DVT   01/18/2018 Imaging   US venous Doppler Right: No evidence of common femoral vein obstruction. Left: Findings consistent with acute deep vein thrombosis involving the left common femoral vein, and left popliteal vein.   01/19/2018 Surgery   Pre-operative Diagnosis: Subacute DVT with severe post thrombotic syndrome Post-operative diagnosis:  Same Surgeon:  Erlene Quan C. Donzetta Matters, MD Procedure Performed: 1.  Ultrasound-guided cannulation left small saphenous vein 2.  Left lower extremity and central venography 3.  Intravascular ultrasound of left popliteal, femoral, common femoral, external and common iliac veins and IVC 4.  Stent of left common and external iliac veins with 14 x 60 mm Vici 5.  Moderate sedation with fentanyl and Versed for 50 minutes  Indications: 73 year old female with a history of DVT in October now presents with persistent left lower extremity swelling and ultrasound demonstrating likely persistent DVT.  She has been on Xarelto at this time.  She is now indicated for venogram possible intervention.  Findings: Flow in the left lower extremity was stagnant throughout but by venogram all veins were patent.  There was a focal occlusive area approximately 2 cm in length at the common and external iliac vein junction at the hypogastric on the left.  After stenting and ballooning we had a diameter of 12 millimeters in the stent and venogram demonstrated flow in the lower extremity veins were previously was stagnant and no further residual stenosis in the left common and external iliac vein junction.   04/12/2018 Imaging   US Venous Doppler Right: No evidence of common femoral vein obstruction. Left: There is no evidence of deep vein thrombosis in the lower extremity. However, portions of this examination were  limited- see technologist comments above. Left groin: Large hypoechoic area with mixed echoes noted measuring nearly 10 cm. Possible  hematoma versus unknown etiology. Ultrasound characteristics of enlarged lymph nodes noted in the groin.      05/15/2018 Imaging   US Venous Doppler Right: No evidence of deep vein thrombosis in the lower extremity. No indirect evidence of obstruction proximal to the inguinal ligament. Left: No reflux was noted in the common femoral vein , femoral vein in the thigh, popliteal vein, great saphenous vein at the saphenofemoral junction, great saphenous vein at the proximal thigh, great saphenous vein at the mid thigh, great saphenous vein  at the distal thigh, great saphenous vein at the knee, origin of the small saphenous vein, proximal small saphenous vein, and mid small saphenous vein. There is no evidence of deep vein thrombosis in the lower extremity. There is no evidence of superficial venous thrombosis. No cystic structure found in the popliteal fossa. Unable to evaluate extension of common femoral vein obstruction proximal to the inguinal ligament.   06/01/2018 Imaging   1. 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 Additionally, there is lucency involving the medial sidewall of the left acetabulum with potential nondisplaced pathologic fracture. Further evaluation with contrast-enhanced pelvic MRI could be performed as clinically indicated. 2. The left pelvic arterial and venous system is encased by this infiltrative left pelvic sidewall mass however while difficult to ascertain, the left external iliac venous stent appears patent.   06/18/2018 Pathology Results   Lymph node for lymphoma, Left Inguinal - METASTATIC ADENOCARCINOMA, SEE COMMENT. Microscopic Comment Immunohistochemistry is positive for cytokeratin 7, PAX8, ER, and PR. Cytokeratin 5/6,and p63 are  negative. The immunoprofile along with the patient's history are consistent with a gynecologic  primary.   06/18/2018 Surgery   Pre-op Diagnosis: INGUINAL LYMPHADENOPATHY, PELVIC MASS     Procedure(s): EXCISIONAL BIOPSY DEEP LEFT INGUINAL LYMPH NODE  Surgeon(s): Coralie Keens, MD    06/24/2018 Cancer Staging   Staging form: Corpus Uteri - Carcinoma and Carcinosarcoma, AJCC 8th Edition - Clinical: Stage IVB (cT1a, cN2, pM1) - Signed by [name], MD on 06/24/2018    Genetic Testing   Patient has genetic testing done for MMR on pathology from 06/18/2018. Results revealed patient has the following mutation(s): MMR: abnormal   06/29/2018 Procedure   Placement of a subcutaneous port device. Catheter tip at the SVC and right atrium junction.    Genetic Testing   Patient has genetic testing done for MSI on pathology from 06/18/2018. Results revealed patient has the following mutation(s): MSI: High   07/02/2018 PET scan   Previous hysterectomy, with asymmetric focus of hypermetabolic activity in the left vaginal cuff, suspicious for residual or recurrent carcinoma.  Large hypermetabolic soft tissue mass involving the left pelvic sidewall and acetabulum, consistent with metastatic disease.  No evidence metastatic disease within the abdomen, chest, or neck.   07/09/2018 Tumor Marker   Patient's tumor was tested for the following markers: CA-125 Results of the tumor marker test revealed 9   07/10/2018 -  Chemotherapy   The patient had carboplatin and taxol   07/17/2018 Genetic Testing   Negative genetic testing on the common hereditary cancer panel.  The Common Hereditary Gene Panel offered by [facility] includes sequencing and/or deletion duplication testing of the following 48 genes: APC, ATM, AXIN2, BARD1, BMPR1A, BRCA1, BRCA2, BRIP1, CDH1, CDK4, CDKN2A (p14ARF), CDKN2A (p16INK4a), CHEK2, CTNNA1, DICER1, EPCAM (Deletion/duplication testing only), GREM1 (promoter region  deletion/duplication testing only), KIT, MEN1, MLH1, MSH2, MSH3, MSH6, MUTYH, NBN, NF1, NHTL1, PALB2, PDGFRA, PMS2, POLD1, POLE, PTEN, RAD50, RAD51C, RAD51D, RNF43, SDHB, SDHC, SDHD, SMAD4, SMARCA4. STK11, TP53, TSC1, TSC2, and VHL.  The following genes were evaluated for sequence changes only: SDHA and HOXB13 c.251G>A variant only. The report date is Jul 17, 2018.    Metastasis to lymph nodes ([place])  06/23/2018 Initial Diagnosis   Metastasis to lymph nodes ([place])   07/10/2018 -  Chemotherapy   The patient had palonosetron (ALOXI) injection 0.25 mg, 0.25 mg, Intravenous,  Once, 2 of 6 cycles Administration: 0.25 mg (07/10/2018) CARBOplatin (PARAPLATIN) 480 mg in sodium chloride 0.9 % 250 mL chemo infusion, 480 mg (100 % of original dose 482.5 mg), Intravenous,  Once, 2 of 6 cycles Dose modification: 482.5 mg (original dose 482.5 mg, Cycle 1) Administration: 480 mg (07/10/2018) PACLitaxel (TAXOL) 276 mg in sodium chloride 0.9 % 250 mL chemo infusion (> 96m/m2), 140 mg/m2 = 276 mg (80 % of original dose 175 mg/m2), Intravenous,  Once, 2 of 6 cycles Dose modification: 140 mg/m2 (80 % of original dose 175 mg/m2, Cycle 1, Reason: Dose Not Tolerated) Administration: 276 mg (07/10/2018) fosaprepitant (EMEND) 150 mg, dexamethasone (DECADRON) 12 mg in sodium chloride 0.9 % 145 mL IVPB, , Intravenous,  Once, 2 of 6 cycles Administration:  (07/10/2018)  for chemotherapy treatment.    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 ([place]  06/24/2018 Initial Diagnosis   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 ([place]   07/10/2018 -  Chemotherapy   The patient had palonosetron (ALOXI) injection 0.25 mg, 0.25 mg, Intravenous,  Once, 1 of 6 cycles Administration: 0.25 mg (07/10/2018) CARBOplatin (PARAPLATIN) 480 mg in sodium chloride 0.9 % 250 mL chemo infusion, 480 mg (100 % of original dose 482.5 mg), Intravenous,  Once, 1 of 6  cycles Dose modification: 482.5 mg (original dose 482.5 mg, Cycle 1) Administration: 480 mg (07/10/2018) PACLitaxel (TAXOL) 276 mg in  sodium chloride 0.9 % 250 mL chemo infusion (> 25m/m2), 140 mg/m2 = 276 mg (80 % of original dose 175 mg/m2), Intravenous,  Once, 1 of 6 cycles Dose modification: 140 mg/m2 (80 % of original dose 175 mg/m2, Cycle 1, Reason: Dose Not Tolerated) Administration: 276 mg (07/10/2018) fosaprepitant (EMEND) 150 mg, dexamethasone (DECADRON) 12 mg in sodium chloride 0.9 % 145 mL IVPB, , Intravenous,  Once, 1 of 6 cycles Administration:  (07/10/2018)  for chemotherapy treatment.      REVIEW OF SYSTEMS:   Constitutional: Denies fevers, chills or abnormal weight loss Eyes: Denies blurriness of vision Ears, nose, mouth, throat, and face: Denies mucositis or sore throat Respiratory: Denies cough, dyspnea or wheezes Cardiovascular: Denies palpitation, chest discomfort or lower extremity swelling Gastrointestinal:  Denies nausea, heartburn or change in bowel habits Skin: Denies abnormal skin rashes Lymphatics: Denies new lymphadenopathy or easy bruising Neurological:Denies numbness, tingling or new weaknesses Behavioral/Psych: Mood is stable, no new changes  All other systems were reviewed with the patient and are negative.  I have reviewed the past medical history, past surgical history, social history and family history with the patient and they are unchanged from previous note.  ALLERGIES:  has No Known Allergies.  MEDICATIONS:  Current Outpatient Medications  Medication Sig Dispense Refill  . clopidogrel (PLAVIX) 75 MG tablet Take 1 tablet (75 mg total) by mouth daily with breakfast. 30 tablet 11  . cycloSPORINE (RESTASIS) 0.05 % ophthalmic emulsion Place 1 drop into both eyes 2 (two) times daily.    .Marland Kitchendexamethasone (DECADRON) 4 MG tablet Take 2 tabs at the night before and 2 tabs the morning of chemotherapy, every 3 weeks, by mouth (Patient taking differently: Take 8 mg by mouth See admin instructions. Take 8 mg the night before chemo and 8 mg the morning of chemotherapy every 3 weeks) 24 tablet 0   . lidocaine-prilocaine (EMLA) cream Apply to affected area once 30 g 3  . morphine (MS CONTIN) 30 MG 12 hr tablet Take 1 tablet (30 mg total) by mouth every 12 (twelve) hours. (Patient taking differently: Take 30 mg by mouth every 12 (twelve) hours as needed for pain. ) 60 tablet 0  . morphine (MSIR) 15 MG tablet Take 1 tablet (15 mg total) by mouth every 6 (six) hours as needed for severe pain. 60 tablet 0  . Olopatadine HCl 0.2 % SOLN Place 1 drop into both eyes daily.    . ondansetron (ZOFRAN ODT) 4 MG disintegrating tablet 467mODT q4 hours prn nausea/vomit 20 tablet 0  . ondansetron (ZOFRAN) 8 MG tablet Take 1 tablet (8 mg total) by mouth every 8 (eight) hours as needed. (Patient taking differently: Take 8 mg by mouth every 8 (eight) hours as needed for nausea or vomiting. ) 30 tablet 1  . polyethylene glycol (MIRALAX / GLYCOLAX) 17 g packet Take 17 g by mouth daily. (Patient not taking: Reported on 07/19/2018) 30 each 11  . potassium chloride SA (K-DUR) 20 MEQ tablet Take 1 tablet (20 mEq total) by mouth daily. 7 tablet 0  . prochlorperazine (COMPAZINE) 10 MG tablet Take 1 tablet (10 mg total) by mouth every 6 (six) hours as needed (Nausea or vomiting). (Patient taking differently: Take 10 mg by mouth every 6 (six) hours as needed for nausea or vomiting. ) 30 tablet 1  . rivaroxaban (XARELTO) 20 MG TABS tablet  Take 1 tablet (20 mg total) by mouth daily with breakfast. 30 tablet 11   No current facility-administered medications for this visit.    Facility-Administered Medications Ordered in Other Visits  Medication Dose Route Frequency Provider Last Rate Last Dose  . CARBOplatin (PARAPLATIN) 480 mg in sodium chloride 0.9 % 250 mL chemo infusion  480 mg Intravenous Once Alvy Bimler, Aldrick Derrig, MD      . famotidine (PEPCID) IVPB 20 mg premix  20 mg Intravenous Once Alvy Bimler, Syndi Pua, MD 200 mL/hr at 07/31/18 1219 20 mg at 07/31/18 1219  . fosaprepitant (EMEND) 150 mg, dexamethasone (DECADRON) 12 mg in sodium  chloride 0.9 % 145 mL IVPB   Intravenous Once Alvy Bimler, Remonia Otte, MD      . heparin lock flush 100 unit/mL  500 Units Intracatheter Once PRN Alvy Bimler, Mylan Schwarz, MD      . PACLitaxel (TAXOL) 276 mg in sodium chloride 0.9 % 250 mL chemo infusion (> 57m/m2)  140 mg/m2 (Treatment Plan Recorded) Intravenous Once Shanon Becvar, MD      . sodium chloride flush (NS) 0.9 % injection 10 mL  10 mL Intracatheter PRN GAlvy Bimler Leocadio Heal, MD        PHYSICAL EXAMINATION: ECOG PERFORMANCE STATUS: 1 - Symptomatic but completely ambulatory  Vitals:   07/31/18 1117  BP: 132/64  Pulse: 94  Resp: 18  Temp: 98.2 F (36.8 C)  SpO2: 98%   There were no vitals filed for this visit.  GENERAL:alert, no distress and comfortable SKIN: skin color, texture, turgor are normal, no rashes or significant lesions EYES: normal, Conjunctiva are pink and non-injected, sclera clear OROPHARYNX:no exudate, no erythema and lips, buccal mucosa, and tongue normal  NECK: supple, thyroid normal size, non-tender, without nodularity LYMPH:  no palpable lymphadenopathy in the cervical, axillary or inguinal LUNGS: clear to auscultation and percussion with normal breathing effort HEART: regular rate & rhythm and no murmurs and no lower extremity edema ABDOMEN:abdomen soft, non-tender and normal bowel sounds Musculoskeletal:no cyanosis of digits and no clubbing  NEURO: alert & oriented x 3 with fluent speech, no focal motor/sensory deficits  LABORATORY DATA:  I have reviewed the data as listed    Component Value Date/Time   NA 138 07/31/2018 1045   K 3.3 (L) 07/31/2018 1045   CL 102 07/31/2018 1045   CO2 23 07/31/2018 1045   GLUCOSE 123 (H) 07/31/2018 1045   BUN 11 07/31/2018 1045   CREATININE 0.73 07/31/2018 1045   CALCIUM 9.2 07/31/2018 1045   PROT 7.9 07/31/2018 1045   ALBUMIN 3.4 (L) 07/31/2018 1045   AST 16 07/31/2018 1045   ALT 14 07/31/2018 1045   ALKPHOS 86 07/31/2018 1045   BILITOT 0.4 07/31/2018 1045   GFRNONAA >60 07/31/2018 1045    GFRAA >60 07/31/2018 1045    No results found for: SPEP, UPEP  Lab Results  Component Value Date   WBC 3.2 (L) 07/31/2018   NEUTROABS 2.4 07/31/2018   HGB 9.1 (L) 07/31/2018   HCT 29.1 (L) 07/31/2018   MCV 87.7 07/31/2018   PLT 213 07/31/2018      Chemistry      Component Value Date/Time   NA 138 07/31/2018 1045   K 3.3 (L) 07/31/2018 1045   CL 102 07/31/2018 1045   CO2 23 07/31/2018 1045   BUN 11 07/31/2018 1045   CREATININE 0.73 07/31/2018 1045      Component Value Date/Time   CALCIUM 9.2 07/31/2018 1045   ALKPHOS 86 07/31/2018 1045   AST 16 07/31/2018 1045  ALT 14 07/31/2018 1045   BILITOT 0.4 07/31/2018 1045       RADIOGRAPHIC STUDIES: I have personally reviewed the radiological images as listed and agreed with the findings in the report. Nm Pet Image Initial (pi) Skull Base To Thigh  Result Date: 07/02/2018 CLINICAL DATA:  Initial treatment strategy for uterine carcinoma. EXAM: NUCLEAR MEDICINE PET SKULL BASE TO THIGH TECHNIQUE: 9.4 mCi F-18 FDG was injected intravenously. Full-ring PET imaging was performed from the skull base to thigh after the radiotracer. CT data was obtained and used for attenuation correction and anatomic localization. Fasting blood glucose: 97 mg/dl COMPARISON:  AP CT on 06/01/2018 FINDINGS: Mediastinal blood-pool activity (reference): SUV max = 2.4 Liver activity (reference): SUV max = N/A NECK:  No hypermetabolic lymph nodes or masses. Incidental CT findings:  None. CHEST: No hypermetabolic masses or lymphadenopathy. No suspicious pulmonary nodules seen on CT images. Incidental CT findings:  None. ABDOMEN/PELVIS: No abnormal hypermetabolic activity within the liver, pancreas, adrenal glands, or spleen. No hypermetabolic lymph nodes within the abdomen. Stent seen in the left common iliac and proximal external iliac veins. Large left pelvic wall soft tissue mass is seen which encases this stent, and involves the left acetabulum. This measures  9.3 x 6.4 cm on image 136/4, mildly increased from 8.5 by 5.8 cm previously. This has SUV max of 17.4. Prior hysterectomy. Asymmetric focal FDG uptake is seen in the left vaginal cuff with SUV max of 7.9 mm. Incidental CT findings:  None. SKELETON: No focal hypermetabolic bone lesions to suggest skeletal metastasis. Incidental CT findings:  None. IMPRESSION: Previous hysterectomy, with asymmetric focus of hypermetabolic activity in the left vaginal cuff, suspicious for residual or recurrent carcinoma. Large hypermetabolic soft tissue mass involving the left pelvic sidewall and acetabulum, consistent with metastatic disease. No evidence metastatic disease within the abdomen, chest, or neck. Electronically Signed   By: Earle Gell M.D.   On: 07/02/2018 15:15   Dg Abd 2 Views  Result Date: 07/15/2018 CLINICAL DATA:  73 year old female with a history of cancer and abdominal pain. EXAM: ABDOMEN - 2 VIEW COMPARISON:  PET CT 07/02/2018 FINDINGS: Gas within stomach small bowel and colon. No abnormal distension. No air-fluid levels. Cholecystectomy. No unexpected radiopaque foreign body. No unexpected calcification. Stent of the left iliac system is unchanged. Irregularity along the left pectineal line of the pelvis, in the region of known tumor and pathologic fracture. No displaced fracture. Surgical clips projecting over the left hemipelvis. Degenerative changes of the bilateral hips. IMPRESSION: Nonobstructive bowel gas pattern. Irregularity along the left pectineal line at the acetabulum, in the region of known tumor involvement and pathologic fracture. No displaced fracture identified. Vascular stent of the left iliac system. Electronically Signed   By: Corrie Mckusick D.O.   On: 07/15/2018 13:50    All questions were answered. The patient knows to call the clinic with any problems, questions or concerns. No barriers to learning was detected.  I spent 15 minutes counseling the patient face to face. The total time  spent in the appointment was 20 minutes and more than 50% was on counseling and review of test results  [name], MD 07/31/2018 12:21 PM

## 2018-08-10 ENCOUNTER — Telehealth: Payer: Self-pay

## 2018-08-10 NOTE — Telephone Encounter (Signed)
Pt called reporting a reddened area on her leg about the size of a penny that popped up about 2 wks ago.   She says the area is on her left leg between her knee and her ankle and has recently started oozing pus. Pt scheduled to see Sandi Mealy,  PA on 6/23 Pt aware of appt date, time

## 2018-08-11 ENCOUNTER — Telehealth: Payer: Self-pay | Admitting: Medical

## 2018-08-11 ENCOUNTER — Inpatient Hospital Stay: Payer: Medicare Other

## 2018-08-11 ENCOUNTER — Inpatient Hospital Stay (HOSPITAL_BASED_OUTPATIENT_CLINIC_OR_DEPARTMENT_OTHER): Payer: Medicare Other | Admitting: Medical

## 2018-08-11 ENCOUNTER — Other Ambulatory Visit: Payer: Self-pay

## 2018-08-11 VITALS — BP 108/56 | HR 99 | Temp 98.2°F | Resp 18 | Ht 61.0 in | Wt 203.3 lb

## 2018-08-11 DIAGNOSIS — L03116 Cellulitis of left lower limb: Secondary | ICD-10-CM

## 2018-08-11 DIAGNOSIS — Z87891 Personal history of nicotine dependence: Secondary | ICD-10-CM

## 2018-08-11 DIAGNOSIS — C7951 Secondary malignant neoplasm of bone: Secondary | ICD-10-CM | POA: Diagnosis not present

## 2018-08-11 DIAGNOSIS — K219 Gastro-esophageal reflux disease without esophagitis: Secondary | ICD-10-CM

## 2018-08-11 DIAGNOSIS — Z5111 Encounter for antineoplastic chemotherapy: Secondary | ICD-10-CM

## 2018-08-11 DIAGNOSIS — G893 Neoplasm related pain (acute) (chronic): Secondary | ICD-10-CM

## 2018-08-11 DIAGNOSIS — C55 Malignant neoplasm of uterus, part unspecified: Secondary | ICD-10-CM

## 2018-08-11 DIAGNOSIS — C541 Malignant neoplasm of endometrium: Secondary | ICD-10-CM | POA: Diagnosis not present

## 2018-08-11 DIAGNOSIS — Z7901 Long term (current) use of anticoagulants: Secondary | ICD-10-CM

## 2018-08-11 DIAGNOSIS — Z79899 Other long term (current) drug therapy: Secondary | ICD-10-CM

## 2018-08-11 DIAGNOSIS — I82502 Chronic embolism and thrombosis of unspecified deep veins of left lower extremity: Secondary | ICD-10-CM

## 2018-08-11 DIAGNOSIS — D61818 Other pancytopenia: Secondary | ICD-10-CM

## 2018-08-11 DIAGNOSIS — I10 Essential (primary) hypertension: Secondary | ICD-10-CM

## 2018-08-11 DIAGNOSIS — Z923 Personal history of irradiation: Secondary | ICD-10-CM

## 2018-08-11 LAB — CBC WITH DIFFERENTIAL (CANCER CENTER ONLY)
Abs Immature Granulocytes: 0.02 10*3/uL (ref 0.00–0.07)
Basophils Absolute: 0 10*3/uL (ref 0.0–0.1)
Basophils Relative: 0 %
Eosinophils Absolute: 0 10*3/uL (ref 0.0–0.5)
Eosinophils Relative: 1 %
HCT: 24.2 % — ABNORMAL LOW (ref 36.0–46.0)
Hemoglobin: 7.6 g/dL — ABNORMAL LOW (ref 12.0–15.0)
Immature Granulocytes: 1 %
Lymphocytes Relative: 25 %
Lymphs Abs: 0.4 10*3/uL — ABNORMAL LOW (ref 0.7–4.0)
MCH: 26.8 pg (ref 26.0–34.0)
MCHC: 31.4 g/dL (ref 30.0–36.0)
MCV: 85.2 fL (ref 80.0–100.0)
Monocytes Absolute: 0.4 10*3/uL (ref 0.1–1.0)
Monocytes Relative: 27 %
Neutro Abs: 0.7 10*3/uL — ABNORMAL LOW (ref 1.7–7.7)
Neutrophils Relative %: 46 %
Platelet Count: 172 10*3/uL (ref 150–400)
RBC: 2.84 MIL/uL — ABNORMAL LOW (ref 3.87–5.11)
RDW: 16.7 % — ABNORMAL HIGH (ref 11.5–15.5)
WBC Count: 1.6 10*3/uL — ABNORMAL LOW (ref 4.0–10.5)
nRBC: 0 % (ref 0.0–0.2)

## 2018-08-11 MED ORDER — LANSOPRAZOLE 30 MG PO CPDR: 30.0000 mg | DELAYED_RELEASE_CAPSULE | Freq: Every day | ORAL | 5 refills | Status: DC

## 2018-08-11 MED ORDER — SODIUM CHLORIDE 0.9 % IV SOLN
Freq: Once | INTRAVENOUS | Status: AC
  Administered 2018-08-11: 10:00:00 via INTRAVENOUS
  Filled 2018-08-11: qty 250

## 2018-08-11 MED ORDER — DEXTROSE 5 % IV SOLN
1.0000 g | Freq: Once | INTRAVENOUS | Status: AC
  Administered 2018-08-11: 1 g via INTRAVENOUS
  Filled 2018-08-11: qty 10

## 2018-08-11 MED ORDER — CEPHALEXIN 500 MG PO CAPS: 500.0000 mg | ORAL_CAPSULE | Freq: Four times a day (QID) | ORAL | 0 refills | Status: DC

## 2018-08-11 MED ORDER — SULFAMETHOXAZOLE-TRIMETHOPRIM 800-160 MG PO TABS: 1.0000 | ORAL_TABLET | Freq: Two times a day (BID) | ORAL | 0 refills | Status: DC

## 2018-08-11 MED ORDER — ALUM & MAG HYDROXIDE-SIMETH 200-200-20 MG/5ML PO SUSP
30.0000 mL | Freq: Once | ORAL | Status: AC
  Administered 2018-08-11: 10:00:00 30 mL via ORAL

## 2018-08-11 MED ORDER — LIDOCAINE VISCOUS HCL 2 % MT SOLN
15.0000 mL | Freq: Once | OROMUCOSAL | Status: AC
  Administered 2018-08-11: 15 mL via ORAL
  Filled 2018-08-11: qty 15

## 2018-08-11 MED ORDER — MUPIROCIN 2 % EX OINT: TOPICAL_OINTMENT | CUTANEOUS | 2 refills | Status: DC

## 2018-08-11 MED ORDER — HEPARIN SOD (PORK) LOCK FLUSH 100 UNIT/ML IV SOLN
500.0000 [IU] | Freq: Once | INTRAVENOUS | Status: AC
  Administered 2018-08-11: 500 [IU]
  Filled 2018-08-11: qty 5

## 2018-08-11 MED ORDER — SODIUM CHLORIDE 0.9% FLUSH
10.0000 mL | Freq: Once | INTRAVENOUS | Status: AC
  Administered 2018-08-11: 10 mL
  Filled 2018-08-11: qty 10

## 2018-08-11 MED ORDER — ALUM & MAG HYDROXIDE-SIMETH 200-200-20 MG/5ML PO SUSP
ORAL | Status: AC
  Filled 2018-08-11: qty 30

## 2018-08-11 NOTE — Telephone Encounter (Signed)
Scheduled appt per 6/23 los. Spoke with patient and she is aware of her appt date and time.

## 2018-08-11 NOTE — Progress Notes (Signed)
Pt presents with wound on front of left lower leg above her ankle.  Pt is not sure when it really started or occurred.  Red, tender, warm, swollen, and painful with yellowish pus drainage from site.  Baseline ROM and sensation and strength in L leg and pulses palpable.  No streaking or rash or fever or CP/SOB/trouble swallowing present.  Pt reports reflux and asks for suggestions for GERD medications.  GI cocktail given during [facility] infusion, tolerated well.  Pt finished IV abx, tolerated well.  Pt verbalized understanding of appt date/time for f/u and d/c instructions to call back as needed before then.  Ambulatory w/walker to exit with belongings.

## 2018-08-11 NOTE — Progress Notes (Signed)
[facility] Progress Note   [name] [id number] 1945/09/09 73 y.o.  Collene Massimino Colledge is managed by Dr. [name]  Actively treated with chemotherapy/immunotherapy/hormonal therapy: yes  Current therapy: carboplatin and paclitaxel  Last treated: 07/31/2018 (cycle 2, day 1)  Next scheduled appointment with provider: 08/24/2018  Assessment: Plan:    Cellulitis of the left lower extremity: The patient was given Rocephin 1 g IV today and was given a prescription for Bactroban ointment to use 3 times daily and Keflex 500 mg p.o. 4 times daily x10 days.  A CBC was completed which returned showing a WBC of 1.6 and an ANC of 0.7.  Patient will return for follow-up on 08/14/2018.     GERD: The patient was given a GI cocktail and was given a prescription for Prilosec 20 mg once daily.  Metastatic uterine cancer: The patient continues to be managed by Dr. [name] and is status post cycle 2, day 1 of carboplatin and paclitaxel which was dosed on 07/31/2018.  She is scheduled for follow-up on 08/24/2018.  Please see After Visit Summary for patient specific instructions.  Future Appointments  Date Time Provider [place]  08/14/2018  9:00 AM Harle Stanford., PA-C [facility] None  08/20/2018 10:15 AM Gery Pray, MD [facility] None  08/24/2018  9:30 AM [facility] LAB 2 [facility] None  08/24/2018  9:45 AM [facility] FLUSH [facility] None  08/24/2018 10:15 AM [name], MD [facility] None  08/24/2018 11:15 AM [facility] INFUSION [facility] None    Orders Placed This Encounter  Procedures  . CBC with Differential (Cancer Center Only)       Subjective:   Patient ID:  [name] is a 73 y.o. (DOB 1945-08-18) female.  Chief Complaint:  Chief Complaint  Patient presents with  . Wound Check    HPI [name] is a 73 year old female with a history of a metastatic carcinoma of the uterus who is managed by Dr. Alvy Bimler and is status post cycle 2, day  1 of carboplatin and paclitaxel which was dosed on 07/31/2018.  She presents to clinic today with a report of redness and swelling of her anterior left lower extremity.  She reports having a draining lesion over her left anterior shin.  She does not recall any injury.  She states that the area is painful and tender and warm to the touch.  It is red.  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  She denies fevers, chills, or sweats.  She is unable to tell me today how long she has been having the symptoms of erythema, warmth, pain, and drainage from her left lower extremity.  Medications: I have reviewed the patient's current medications.  Allergies: No Known Allergies  Past Medical History:  Diagnosis Date  . Acute upper respiratory infection 07/06/2014  . Anemia   . Arthritis    Back   . Colon polyp    Tubular Adenoma   . Cough productive of clear sputum 06/22/2014  . Family history of breast cancer   . GERD (gastroesophageal reflux disease)   . History of right bundle branch block (RBBB)   . HOH (hard of hearing)   . Hypertension    had in the past, is no longer on medication for this and blood pressures are WNL  . 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 Primary localized osteoarthritis of right knee   . Uterine cancer (  [place]) 06/23/2018    Past Surgical History:  Procedure Laterality Date  . ABDOMINAL HYSTERECTOMY  2012  . CHOLECYSTECTOMY N/A 03/09/2013   Procedure: LAPAROSCOPIC CHOLECYSTECTOMY;  Surgeon: Gayland Curry, MD;  Location: [place];  Service: General;  Laterality: N/A;  . COLONOSCOPY W/ BIOPSIES    . IR IMAGING GUIDED PORT INSERTION  06/29/2018  . LARYNGOSCOPY Left 03/14/2017   Procedure: LARYNGOSCOPY;  Surgeon: Helayne Seminole, MD;  Location: [facility];  Service: ENT;  Laterality: Left;  . LOWER EXTREMITY VENOGRAPHY Left 01/19/2018   Procedure: LOWER EXTREMITY VENOGRAPHY;  Surgeon: Waynetta Sandy, MD;  Location: [facility];  Service: Cardiovascular;  Laterality: Left;  . LYMPH NODE BIOPSY Left 06/18/2018   Procedure: EXCISIONAL BIOPSY LEFT INGUINAL LYMPH NODE;  Surgeon: Coralie Keens, MD;  Location: [facility];  Service: General;  Laterality: Left;  . PERIPHERAL VASCULAR INTERVENTION Left 01/19/2018   Procedure: PERIPHERAL VASCULAR INTERVENTION;  Surgeon: Waynetta Sandy, MD;  Location: [facility];  Service: Cardiovascular;  Laterality: Left;  LEFT ILIAC VENOUS  . TOTAL KNEE ARTHROPLASTY  04/29/2011   Procedure: TOTAL KNEE ARTHROPLASTY;  Surgeon: Lorn Junes, MD;  Location: [facility];  Service: Orthopedics;  Laterality: Left;  DR Weidman THIS CASE  . TOTAL KNEE ARTHROPLASTY Right 07/04/2014   Procedure: TOTAL KNEE ARTHROPLASTY;  Surgeon: Elsie Saas, MD;  Location: [facility];  Service: Orthopedics;  Laterality: Right;    Family History  Problem Relation Age of Onset  . Arthritis Mother   . Hypertension Mother   . Alzheimer's disease Father   . Diabetes Sister   . Hypertension Sister   . Hypertension Brother   . Stroke Brother   . Hypertension Brother   . Hypertension Sister   . Hypertension Sister   . Hypertension Sister   . Breast cancer Other        Niece  . Breast cancer Niece 44       sister's daughter  . Anesthesia problems Neg Hx   . Hypotension Neg Hx   . Malignant hyperthermia Neg Hx   . Pseudochol deficiency Neg Hx   . Colon cancer Neg Hx     Social History   Socioeconomic History  . Marital status: Divorced    Spouse name: Not on file  . Number of children: 1  . Years of education: Not on file  . Highest education level: Not on file  Occupational History  . Occupation: Retired   Scientific laboratory technician  . Financial resource strain: Not on file  . Food insecurity    Worry: Not on file    Inability: Not on file  . Transportation needs    Medical: Not on file    Non-medical: Not on file  Tobacco Use  . Smoking status: Former Smoker     Years: 1.00    Quit date: 04/22/1988    Years since quitting: 30.3  . Smokeless tobacco: Never Used  Substance and Sexual Activity  . Alcohol use: No  . Drug use: No  . Sexual activity: Yes    Birth control/protection: Surgical  Lifestyle  . Physical activity    Days per week: Not on file    Minutes per session: Not on file  . Stress: Not on file  Relationships  . Social Herbalist on phone: Not on file    Gets together: Not on file    Attends religious service: Not on file  Active member of club or organization: Not on file    Attends meetings of clubs or organizations: Not on file    Relationship status: Not on file  . Intimate partner violence    Fear of current or ex partner: Not on file    Emotionally abused: Not on file    Physically abused: Not on file    Forced sexual activity: Not on file  Other Topics Concern  . Not on file  Social History Narrative   Daily caffeine     Past Medical History, Surgical history, Social history, and Family history were reviewed and updated as appropriate.   Please see review of systems for further details on the patient's review from today.   Review of Systems:  Review of Systems  Constitutional: Negative for chills, diaphoresis and fever.  HENT: Negative for trouble swallowing and voice change.   Respiratory: Negative for cough, chest tightness, shortness of breath and wheezing.   Cardiovascular: Positive for leg swelling. Negative for chest pain and palpitations.  Gastrointestinal: Negative for abdominal pain, constipation, diarrhea, nausea and vomiting.  Musculoskeletal: Negative for back pain and myalgias.  Skin: Positive for color change.       Erythema, warmth, tenderness, and draining lesion over the left shin.  Neurological: Negative for dizziness, light-headedness and headaches.    Objective:   Physical Exam:  BP (!) 108/56 (BP Location: Left Arm, Patient Position: Sitting) Comment: nurse aware of bp   Pulse 99   Temp 98.2 F (36.8 C) (Oral)   Resp 18   Ht 5\' 1"  (1.549 m)   Wt 203 lb 4.8 oz (92.2 kg)   SpO2 99%   BMI 38.41 kg/m  ECOG: 1  Physical Exam Constitutional:      General: She is not in acute distress.    Appearance: She is not diaphoretic.  HENT:     Head: Normocephalic and atraumatic.  Eyes:     General: No scleral icterus.       Right eye: No discharge.        Left eye: No discharge.     Conjunctiva/sclera: Conjunctivae normal.  Cardiovascular:     Rate and Rhythm: Normal rate and regular rhythm.     Heart sounds: Normal heart sounds. No murmur. No friction rub. No gallop.   Pulmonary:     Effort: Pulmonary effort is normal. No respiratory distress.     Breath sounds: Normal breath sounds. No wheezing or rales.  Skin:    General: Skin is warm and dry.     Findings: Erythema and lesion present. No rash.     Comments: Erythema and increased warmth noted over the left lower extremity with a lesion over the left anterior shin and posterior left lower extremity.  The anterior lesion is draining serous fluid.  No purulent drainage is noted.  Increased warmth is noted.  1+ pitting edema is noted in the left lower extremity.  There is a surgical scar over the anterior patella consistent with a left total knee replacement.  Neurological:     Mental Status: She is alert.     Coordination: Coordination normal.     Gait: Gait normal.  Psychiatric:        Mood and Affect: Mood normal.        Behavior: Behavior normal.        Thought Content: Thought content normal.         Lab Review:     Component Value Date/Time  NA 138 07/31/2018 1045   K 3.3 (L) 07/31/2018 1045   CL 102 07/31/2018 1045   CO2 23 07/31/2018 1045   GLUCOSE 123 (H) 07/31/2018 1045   BUN 11 07/31/2018 1045   CREATININE 0.73 07/31/2018 1045   CALCIUM 9.2 07/31/2018 1045   PROT 7.9 07/31/2018 1045   ALBUMIN 3.4 (L) 07/31/2018 1045   AST 16 07/31/2018 1045   ALT 14 07/31/2018 1045   ALKPHOS  86 07/31/2018 1045   BILITOT 0.4 07/31/2018 1045   GFRNONAA >60 07/31/2018 1045   GFRAA >60 07/31/2018 1045       Component Value Date/Time   WBC 1.6 (L) 08/11/2018 0936   WBC 1.6 (L) 07/19/2018 1608   RBC 2.84 (L) 08/11/2018 0936   HGB 7.6 (L) 08/11/2018 0936   HCT 24.2 (L) 08/11/2018 0936   PLT 172 08/11/2018 0936   MCV 85.2 08/11/2018 0936   MCH 26.8 08/11/2018 0936   MCHC 31.4 08/11/2018 0936   RDW 16.7 (H) 08/11/2018 0936   LYMPHSABS 0.4 (L) 08/11/2018 0936   MONOABS 0.4 08/11/2018 0936   EOSABS 0.0 08/11/2018 0936   BASOSABS 0.0 08/11/2018 0936   -------------------------------  Imaging from last 24 hours (if applicable):  Radiology interpretation: Dg Abd 2 Views  Result Date: 07/15/2018 CLINICAL DATA:  73 year old female with a history of cancer and abdominal pain. EXAM: ABDOMEN - 2 VIEW COMPARISON:  PET CT 07/02/2018 FINDINGS: Gas within stomach small bowel and colon. No abnormal distension. No air-fluid levels. Cholecystectomy. No unexpected radiopaque foreign body. No unexpected calcification. Stent of the left iliac system is unchanged. Irregularity along the left pectineal line of the pelvis, in the region of known tumor and pathologic fracture. No displaced fracture. Surgical clips projecting over the left hemipelvis. Degenerative changes of the bilateral hips. IMPRESSION: Nonobstructive bowel gas pattern. Irregularity along the left pectineal line at the acetabulum, in the region of known tumor involvement and pathologic fracture. No displaced fracture identified. Vascular stent of the left iliac system. Electronically Signed   By: Corrie Mckusick D.O.   On: 07/15/2018 13:50        This case was discussed with Dr. Alvy Bimler. She expresses agreement with my management of this patient.

## 2018-08-11 NOTE — Patient Instructions (Addendum)

## 2018-08-14 ENCOUNTER — Telehealth: Payer: Self-pay | Admitting: Medical

## 2018-08-14 ENCOUNTER — Other Ambulatory Visit: Payer: Self-pay

## 2018-08-14 ENCOUNTER — Inpatient Hospital Stay (HOSPITAL_BASED_OUTPATIENT_CLINIC_OR_DEPARTMENT_OTHER): Payer: Medicare Other | Admitting: Medical

## 2018-08-14 VITALS — BP 121/56 | HR 93 | Temp 99.4°F | Resp 17 | Ht 61.0 in | Wt 205.7 lb

## 2018-08-14 DIAGNOSIS — Z87891 Personal history of nicotine dependence: Secondary | ICD-10-CM

## 2018-08-14 DIAGNOSIS — Z7901 Long term (current) use of anticoagulants: Secondary | ICD-10-CM

## 2018-08-14 DIAGNOSIS — Z86718 Personal history of other venous thrombosis and embolism: Secondary | ICD-10-CM

## 2018-08-14 DIAGNOSIS — Z79899 Other long term (current) drug therapy: Secondary | ICD-10-CM

## 2018-08-14 DIAGNOSIS — K219 Gastro-esophageal reflux disease without esophagitis: Secondary | ICD-10-CM

## 2018-08-14 DIAGNOSIS — L03116 Cellulitis of left lower limb: Secondary | ICD-10-CM

## 2018-08-14 DIAGNOSIS — Z5111 Encounter for antineoplastic chemotherapy: Secondary | ICD-10-CM

## 2018-08-14 DIAGNOSIS — C7951 Secondary malignant neoplasm of bone: Secondary | ICD-10-CM

## 2018-08-14 DIAGNOSIS — I82502 Chronic embolism and thrombosis of unspecified deep veins of left lower extremity: Secondary | ICD-10-CM

## 2018-08-14 DIAGNOSIS — Z923 Personal history of irradiation: Secondary | ICD-10-CM

## 2018-08-14 DIAGNOSIS — D61818 Other pancytopenia: Secondary | ICD-10-CM

## 2018-08-14 DIAGNOSIS — G893 Neoplasm related pain (acute) (chronic): Secondary | ICD-10-CM

## 2018-08-14 DIAGNOSIS — I10 Essential (primary) hypertension: Secondary | ICD-10-CM

## 2018-08-14 DIAGNOSIS — Z5189 Encounter for other specified aftercare: Secondary | ICD-10-CM

## 2018-08-14 DIAGNOSIS — C541 Malignant neoplasm of endometrium: Secondary | ICD-10-CM

## 2018-08-14 NOTE — Progress Notes (Signed)
Mrs. Galka was seen earlier this week at which time she was found to have cellulitis of her left lower extremity with a wound in the shin and posterior distal calf.  She has a history of unilateral lower extremity edema greater on the right than left.  She continues on Xarelto for a DVT on that side.  She is compliant with this medication.  She was prescribed Bactroban and Keflex at her visit earlier this week.  She has been using these consistently.  Her left lower extremity continues to show increased warmth and edema but shows lessening of her erythema.  She was instructed to continue taking Keflex and using Bactroban until her therapy is completed.  She was told to present to the emergency room this weekend if she developed fevers, shaking chills, or worsening symptoms in her left lower extremity.  She expressed understanding and agreement with these instructions.  Her next appointment with Dr. [name] is scheduled for 08/24/2018.  She will return sooner if needed.  Sandi Mealy, MHS, PA-C Physician Assistant

## 2018-08-14 NOTE — Telephone Encounter (Signed)
No los per 6/26. °

## 2018-08-18 ENCOUNTER — Other Ambulatory Visit: Payer: Medicare Other

## 2018-08-20 ENCOUNTER — Other Ambulatory Visit: Payer: Self-pay

## 2018-08-20 ENCOUNTER — Ambulatory Visit
Admission: RE | Admit: 2018-08-20 | Discharge: 2018-08-20 | Disposition: A | Discharge status: Home / Self Care | Payer: Medicare Other | Source: Ambulatory Visit | Attending: Radiation Oncology | Admitting: Radiation Oncology

## 2018-08-20 ENCOUNTER — Encounter: Payer: Self-pay | Admitting: Radiation Oncology

## 2018-08-20 VITALS — BP 117/63 | HR 100 | Temp 98.9°F | Resp 20 | Ht 61.0 in | Wt 204.6 lb

## 2018-08-20 DIAGNOSIS — R5383 Other fatigue: Secondary | ICD-10-CM | POA: Diagnosis not present

## 2018-08-20 DIAGNOSIS — Z9221 Personal history of antineoplastic chemotherapy: Secondary | ICD-10-CM | POA: Diagnosis not present

## 2018-08-20 DIAGNOSIS — C7951 Secondary malignant neoplasm of bone: Secondary | ICD-10-CM

## 2018-08-20 DIAGNOSIS — C541 Malignant neoplasm of endometrium: Secondary | ICD-10-CM | POA: Diagnosis not present

## 2018-08-20 DIAGNOSIS — Z923 Personal history of irradiation: Secondary | ICD-10-CM | POA: Diagnosis not present

## 2018-08-20 DIAGNOSIS — Z79899 Other long term (current) drug therapy: Secondary | ICD-10-CM | POA: Insufficient documentation

## 2018-08-20 DIAGNOSIS — Z7901 Long term (current) use of anticoagulants: Secondary | ICD-10-CM | POA: Diagnosis not present

## 2018-08-20 DIAGNOSIS — M7989 Other specified soft tissue disorders: Secondary | ICD-10-CM | POA: Insufficient documentation

## 2018-08-20 NOTE — Progress Notes (Signed)
Radiation Oncology         [phone] ________________________________  Name: [name] MRN: [id number]  Date: 08/20/2018  DOB: [birth year]  Follow-Up Visit Note  CC: Nolene Ebbs, MD  Nolene Ebbs, MD    ICD-10-CM   1. 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 [facility])  C79.51     Diagnosis:   Recurrentgrade 2 endometrioid endometrial adenocarcinoma   Interval Since Last Radiation:  1 month  06/30/2018 - 07/21/2018: Left Pelvis / 35 Gy in 14 fractions  Narrative:  The patient returns today for routine follow-up. She was recently seen in the [facility] for an open wound on her left lower extremity.  Is been placed on antibiotics for this issue.  She reports fatigue, which she is unsure if it is caused by radiation or chemotherapy. She denies rectal bleeding, diarrhea, and skin breakdown in the radiation field. She reports pain and swelling to her left leg.                              ALLERGIES:  has No Known Allergies.  Meds: Current Outpatient Medications  Medication Sig Dispense Refill  . cephALEXin (KEFLEX) 500 MG capsule Take 1 capsule (500 mg total) by mouth 4 (four) times daily. 40 capsule 0  . clopidogrel (PLAVIX) 75 MG tablet Take 1 tablet (75 mg total) by mouth daily with breakfast. 30 tablet 11  . cycloSPORINE (RESTASIS) 0.05 % ophthalmic emulsion Place 1 drop into both eyes 2 (two) times daily.    Marland Kitchen dexamethasone (DECADRON) 4 MG tablet Take 2 tabs at the night before and 2 tabs the morning of chemotherapy, every 3 weeks, by mouth (Patient taking differently: Take 8 mg by mouth See admin instructions. Take 8 mg the night before chemo and 8 mg the morning of chemotherapy every 3 weeks) 24 tablet 0  . lansoprazole (PREVACID) 30 MG capsule Take 1 capsule (30 mg total) by mouth daily at 12 noon. 30 capsule 5  . lidocaine-prilocaine (EMLA) cream Apply to affected area once 30 g 3  . morphine (MS CONTIN) 30 MG 12 hr tablet Take 1 tablet (30 mg total) by mouth every 12 (twelve)  hours. (Patient taking differently: Take 30 mg by mouth every 12 (twelve) hours as needed for pain. ) 60 tablet 0  . morphine (MSIR) 15 MG tablet Take 1 tablet (15 mg total) by mouth every 6 (six) hours as needed for severe pain. 60 tablet 0  . mupirocin ointment (BACTROBAN) 2 % Apply to left lower extremity wounds 3 times daily 30 g 2  . Olopatadine HCl 0.2 % SOLN Place 1 drop into both eyes daily.    . ondansetron (ZOFRAN ODT) 4 MG disintegrating tablet 4mg  ODT q4 hours prn nausea/vomit 20 tablet 0  . ondansetron (ZOFRAN) 8 MG tablet Take 1 tablet (8 mg total) by mouth every 8 (eight) hours as needed. (Patient taking differently: Take 8 mg by mouth every 8 (eight) hours as needed for nausea or vomiting. ) 30 tablet 1  . polyethylene glycol (MIRALAX / GLYCOLAX) 17 g packet Take 17 g by mouth daily. 30 each 11  . potassium chloride SA (K-DUR) 20 MEQ tablet Take 1 tablet (20 mEq total) by mouth daily. 7 tablet 0  . prochlorperazine (COMPAZINE) 10 MG tablet Take 1 tablet (10 mg total) by mouth every 6 (six) hours as needed (Nausea or vomiting). (Patient taking differently: Take 10 mg by mouth every 6 (six)  hours as needed for nausea or vomiting. ) 30 tablet 1  . rivaroxaban (XARELTO) 20 MG TABS tablet Take 1 tablet (20 mg total) by mouth daily with breakfast. 30 tablet 11  . traMADol (ULTRAM) 50 MG tablet      No current facility-administered medications for this encounter.     Physical Findings: The patient is in no acute distress. Patient is alert and oriented.  height is 5\' 1"  (1.549 m) and weight is 204 lb 9.6 oz (92.8 kg). Her oral temperature is 98.9 F (37.2 C). Her blood pressure is 117/63 and her pulse is 100. Her respiration is 20 and oxygen saturation is 100%.  Lungs are clear to auscultation bilaterally. Heart has regular rate and rhythm. No palpable cervical, supraclavicular, or axillary adenopathy. Abdomen soft, non-tender, normal bowel sounds.  Ambulates with the assistance of a walker    Lab Findings: Lab Results  Component Value Date   WBC 1.6 (L) 08/11/2018   HGB 7.6 (L) 08/11/2018   HCT 24.2 (L) 08/11/2018   MCV 85.2 08/11/2018   PLT 172 08/11/2018    Radiographic Findings: No results found.  Impression:  The patient is recovering from the effects of radiation.  Overall the patient has had improvement in her left pelvic pain.  Recent PET scan shows remaining significant disease however in the left pelvis region.  Patient is receiving chemotherapy at this time.  Plan: PRN follow-up in radiation oncology.  Patient will be candidate for additional radiation therapy directed to the left pelvic mass if she does not respond well to chemotherapy.  ____________________________________ Gery Pray, MD   This document serves as a record of services personally performed by Gery Pray, MD. It was created on his behalf by Wilburn Mylar, a trained medical scribe. The creation of this record is based on the scribe's personal observations and the provider's statements to them. This document has been checked and approved by the attending provider.

## 2018-08-20 NOTE — Patient Instructions (Signed)
Coronavirus (COVID-19) Are you at risk?  Are you at risk for the Coronavirus (COVID-19)?  To be considered HIGH RISK for Coronavirus (COVID-19), you have to meet the following criteria:  . Traveled to [place], [place], [place], [place] or [place]; or in the [place] to [place], [place], [place], or [place]; and have fever, cough, and shortness of breath within the last 2 weeks of travel OR . Been in close contact with a person diagnosed with COVID-19 within the last 2 weeks and have fever, cough, and shortness of breath . IF YOU DO NOT MEET THESE CRITERIA, YOU ARE CONSIDERED LOW RISK FOR COVID-19.  What to do if you are HIGH RISK for COVID-19?  . If you are having a medical emergency, call 911. . Seek medical care right away. Before you go to a doctor's office, urgent care or emergency department, call ahead and tell them about your recent travel, contact with someone diagnosed with COVID-19, and your symptoms. You should receive instructions from your physician's office regarding next steps of care.  . When you arrive at healthcare provider, tell the healthcare staff immediately you have returned from visiting [place], [place], [place], [place] or [place]; or traveled in the [place] to [place], [place], [place], or [place]; in the last two weeks or you have been in close contact with a person diagnosed with COVID-19 in the last 2 weeks.   . Tell the health care staff about your symptoms: fever, cough and shortness of breath. . After you have been seen by a medical provider, you will be either: o Tested for (COVID-19) and discharged home on quarantine except to seek medical care if symptoms worsen, and asked to  - Stay home and avoid contact with others until you get your results (4-5 days)  - Avoid travel on public transportation if possible (such as bus, train, or airplane) or o Sent to the Emergency Department by EMS for evaluation, COVID-19 testing, and possible  admission depending on your condition and test results.  What to do if you are LOW RISK for COVID-19?  Reduce your risk of any infection by using the same precautions used for avoiding the common cold or flu:  . Wash your hands often with soap and warm water for at least 20 seconds.  If soap and water are not readily available, use an alcohol-based hand sanitizer with at least 60% alcohol.  . If coughing or sneezing, cover your mouth and nose by coughing or sneezing into the elbow areas of your shirt or coat, into a tissue or into your sleeve (not your hands). . Avoid shaking hands with others and consider head nods or verbal greetings only. . Avoid touching your eyes, nose, or mouth with unwashed hands.  . Avoid close contact with people who are sick. . Avoid places or events with large numbers of people in one location, like concerts or sporting events. . Carefully consider travel plans you have or are making. . If you are planning any travel outside or inside the [place], visit the CDC's Travelers' Health webpage for the latest health notices. . If you have some symptoms but not all symptoms, continue to monitor at home and seek medical attention if your symptoms worsen. . If you are having a medical emergency, call 911.   ADDITIONAL HEALTHCARE OPTIONS FOR PATIENTS   Telehealth / e-Visit: [email or web address]         [facility]: [phone]  [place] [place]   [facility]: [phone]                   [facility]: [phone]   

## 2018-08-20 NOTE — Progress Notes (Signed)
Pt presents today for f/u with Dr. Sondra Come. Pt reports fatigue and is unsure if it is attributed to radiation or chemotherapy. Pt denies rectal bleeding, diarrhea. Pt denies skin breakdown in radiation field. Pt reports pain in LLE, swollen. Pt with open wound on LLE that is being cared for in [facility].   BP 117/63 (BP Location: Right Arm, Patient Position: Sitting)   Pulse 100   Temp 98.9 F (37.2 C) (Oral)   Resp 20   Ht 5\' 1"  (1.549 m)   Wt 204 lb 9.6 oz (92.8 kg)   SpO2 100%   BMI 38.66 kg/m   Wt Readings from Last 3 Encounters:  08/20/18 204 lb 9.6 oz (92.8 kg)  08/14/18 205 lb 11.2 oz (93.3 kg)  08/11/18 203 lb 4.8 oz (92.2 kg)   Loma Sousa, RN BSN

## 2018-08-24 ENCOUNTER — Inpatient Hospital Stay: Payer: Medicare Other | Attending: Hematology and Oncology

## 2018-08-24 ENCOUNTER — Inpatient Hospital Stay: Payer: Medicare Other

## 2018-08-24 ENCOUNTER — Encounter: Payer: Self-pay | Admitting: Hematology and Oncology

## 2018-08-24 ENCOUNTER — Other Ambulatory Visit: Payer: Self-pay

## 2018-08-24 ENCOUNTER — Inpatient Hospital Stay (HOSPITAL_BASED_OUTPATIENT_CLINIC_OR_DEPARTMENT_OTHER): Payer: Medicare Other | Admitting: Hematology and Oncology

## 2018-08-24 VITALS — BP 117/76 | HR 99 | Temp 98.2°F | Resp 18 | Ht 61.0 in | Wt 206.0 lb

## 2018-08-24 DIAGNOSIS — K5900 Constipation, unspecified: Secondary | ICD-10-CM

## 2018-08-24 DIAGNOSIS — C541 Malignant neoplasm of endometrium: Secondary | ICD-10-CM | POA: Diagnosis present

## 2018-08-24 DIAGNOSIS — G893 Neoplasm related pain (acute) (chronic): Secondary | ICD-10-CM | POA: Diagnosis not present

## 2018-08-24 DIAGNOSIS — Z79899 Other long term (current) drug therapy: Secondary | ICD-10-CM | POA: Diagnosis not present

## 2018-08-24 DIAGNOSIS — Z5111 Encounter for antineoplastic chemotherapy: Secondary | ICD-10-CM | POA: Insufficient documentation

## 2018-08-24 DIAGNOSIS — C7951 Secondary malignant neoplasm of bone: Secondary | ICD-10-CM

## 2018-08-24 DIAGNOSIS — D61818 Other pancytopenia: Secondary | ICD-10-CM | POA: Insufficient documentation

## 2018-08-24 DIAGNOSIS — L03116 Cellulitis of left lower limb: Secondary | ICD-10-CM | POA: Insufficient documentation

## 2018-08-24 DIAGNOSIS — C774 Secondary and unspecified malignant neoplasm of inguinal and lower limb lymph nodes: Secondary | ICD-10-CM

## 2018-08-24 DIAGNOSIS — Z7189 Other specified counseling: Secondary | ICD-10-CM

## 2018-08-24 DIAGNOSIS — R52 Pain, unspecified: Secondary | ICD-10-CM | POA: Diagnosis not present

## 2018-08-24 DIAGNOSIS — C55 Malignant neoplasm of uterus, part unspecified: Secondary | ICD-10-CM

## 2018-08-24 DIAGNOSIS — I825Z2 Chronic embolism and thrombosis of unspecified deep veins of left distal lower extremity: Secondary | ICD-10-CM

## 2018-08-24 DIAGNOSIS — Z86718 Personal history of other venous thrombosis and embolism: Secondary | ICD-10-CM

## 2018-08-24 LAB — CBC WITH DIFFERENTIAL (CANCER CENTER ONLY)
Abs Immature Granulocytes: 0.01 10*3/uL (ref 0.00–0.07)
Basophils Absolute: 0 10*3/uL (ref 0.0–0.1)
Basophils Relative: 0 %
Eosinophils Absolute: 0 10*3/uL (ref 0.0–0.5)
Eosinophils Relative: 0 %
HCT: 25.1 % — ABNORMAL LOW (ref 36.0–46.0)
Hemoglobin: 8.1 g/dL — ABNORMAL LOW (ref 12.0–15.0)
Immature Granulocytes: 0 %
Lymphocytes Relative: 8 %
Lymphs Abs: 0.3 10*3/uL — ABNORMAL LOW (ref 0.7–4.0)
MCH: 28.1 pg (ref 26.0–34.0)
MCHC: 32.3 g/dL (ref 30.0–36.0)
MCV: 87.2 fL (ref 80.0–100.0)
Monocytes Absolute: 0.1 10*3/uL (ref 0.1–1.0)
Monocytes Relative: 1 %
Neutro Abs: 3.3 10*3/uL (ref 1.7–7.7)
Neutrophils Relative %: 91 %
Platelet Count: 216 10*3/uL (ref 150–400)
RBC: 2.88 MIL/uL — ABNORMAL LOW (ref 3.87–5.11)
RDW: 18.6 % — ABNORMAL HIGH (ref 11.5–15.5)
WBC Count: 3.7 10*3/uL — ABNORMAL LOW (ref 4.0–10.5)
nRBC: 0 % (ref 0.0–0.2)

## 2018-08-24 LAB — CMP (CANCER CENTER ONLY)
ALT: 10 U/L (ref 0–44)
AST: 14 U/L — ABNORMAL LOW (ref 15–41)
Albumin: 3.1 g/dL — ABNORMAL LOW (ref 3.5–5.0)
Alkaline Phosphatase: 71 U/L (ref 38–126)
Anion gap: 11 (ref 5–15)
BUN: 8 mg/dL (ref 8–23)
CO2: 22 mmol/L (ref 22–32)
Calcium: 9.2 mg/dL (ref 8.9–10.3)
Chloride: 105 mmol/L (ref 98–111)
Creatinine: 0.72 mg/dL (ref 0.44–1.00)
GFR, Est AFR Am: >60 mL/min (ref >60)
GFR, Estimated: >60 mL/min (ref >60)
Glucose, Bld: 124 mg/dL — ABNORMAL HIGH (ref 70–99)
Potassium: 3.4 mmol/L — ABNORMAL LOW (ref 3.5–5.1)
Sodium: 138 mmol/L (ref 135–145)
Total Bilirubin: 0.4 mg/dL (ref 0.3–1.2)
Total Protein: 7.7 g/dL (ref 6.5–8.1)

## 2018-08-24 MED ORDER — DIPHENHYDRAMINE HCL 50 MG/ML IJ SOLN
50.0000 mg | Freq: Once | INTRAMUSCULAR | Status: AC
  Administered 2018-08-24: 50 mg via INTRAVENOUS

## 2018-08-24 MED ORDER — FAMOTIDINE IN NACL 20-0.9 MG/50ML-% IV SOLN
20.0000 mg | Freq: Once | INTRAVENOUS | Status: AC
  Administered 2018-08-24: 20 mg via INTRAVENOUS

## 2018-08-24 MED ORDER — HEPARIN SOD (PORK) LOCK FLUSH 100 UNIT/ML IV SOLN
500.0000 [IU] | Freq: Once | INTRAVENOUS | Status: AC | PRN
  Administered 2018-08-24: 17:00:00 500 [IU]
  Filled 2018-08-24: qty 5

## 2018-08-24 MED ORDER — PALONOSETRON HCL INJECTION 0.25 MG/5ML
0.2500 mg | Freq: Once | INTRAVENOUS | Status: AC
  Administered 2018-08-24: 0.25 mg via INTRAVENOUS

## 2018-08-24 MED ORDER — SODIUM CHLORIDE 0.9 % IV SOLN
Freq: Once | INTRAVENOUS | Status: AC
  Administered 2018-08-24: 12:00:00 via INTRAVENOUS
  Filled 2018-08-24: qty 5

## 2018-08-24 MED ORDER — SODIUM CHLORIDE 0.9 % IV SOLN
482.5000 mg | Freq: Once | INTRAVENOUS | Status: AC
  Administered 2018-08-24: 480 mg via INTRAVENOUS
  Filled 2018-08-24: qty 48

## 2018-08-24 MED ORDER — SODIUM CHLORIDE 0.9% FLUSH
10.0000 mL | Freq: Once | INTRAVENOUS | Status: AC
  Administered 2018-08-24: 10 mL
  Filled 2018-08-24: qty 10

## 2018-08-24 MED ORDER — SODIUM CHLORIDE 0.9 % IV SOLN
Freq: Once | INTRAVENOUS | Status: AC
  Administered 2018-08-24: 11:00:00 via INTRAVENOUS
  Filled 2018-08-24: qty 250

## 2018-08-24 MED ORDER — DIPHENHYDRAMINE HCL 50 MG/ML IJ SOLN
INTRAMUSCULAR | Status: AC
  Filled 2018-08-24: qty 1

## 2018-08-24 MED ORDER — RIVAROXABAN 20 MG PO TABS: 20.0000 mg | ORAL_TABLET | Freq: Every day | ORAL | 11 refills | Status: DC

## 2018-08-24 MED ORDER — FAMOTIDINE IN NACL 20-0.9 MG/50ML-% IV SOLN
INTRAVENOUS | Status: AC
  Filled 2018-08-24: qty 50

## 2018-08-24 MED ORDER — SODIUM CHLORIDE 0.9 % IV SOLN
140.0000 mg/m2 | Freq: Once | INTRAVENOUS | Status: AC
  Administered 2018-08-24: 276 mg via INTRAVENOUS
  Filled 2018-08-24: qty 46

## 2018-08-24 MED ORDER — SODIUM CHLORIDE 0.9% FLUSH
10.0000 mL | INTRAVENOUS | Status: DC | PRN
  Administered 2018-08-24: 10 mL
  Filled 2018-08-24: qty 10

## 2018-08-24 MED ORDER — PALONOSETRON HCL INJECTION 0.25 MG/5ML
INTRAVENOUS | Status: AC
  Filled 2018-08-24: qty 5

## 2018-08-24 NOTE — Assessment & Plan Note (Signed)
She had completed a recent course of antibiotics With her permission, I took a picture of the wound and it appears to be healing well with healthy granulation tissue I recommend dry dressing changes only We will monitor closely

## 2018-08-24 NOTE — Assessment & Plan Note (Signed)
She has acquired pancytopenia from treatment She is not symptomatic Observe only

## 2018-08-24 NOTE — Assessment & Plan Note (Signed)
She denies worsening leg swelling after radiation treatment I refill her prescription Xarelto I plan to repeat CT imaging to evaluate her cancer situation and that should give [place] an idea how her venous drainage look like

## 2018-08-24 NOTE — Patient Instructions (Signed)
[facility] Discharge Instructions for Patients Receiving Chemotherapy  Today you received the following chemotherapy agents: Taxol and Carboplatin   To help prevent nausea and vomiting after your treatment, we encourage you to take your nausea medication as directed.    If you develop nausea and vomiting that is not controlled by your nausea medication, call the clinic.   BELOW ARE SYMPTOMS THAT SHOULD BE REPORTED IMMEDIATELY:  *FEVER GREATER THAN 100.5 F  *CHILLS WITH OR WITHOUT FEVER  NAUSEA AND VOMITING THAT IS NOT CONTROLLED WITH YOUR NAUSEA MEDICATION  *UNUSUAL SHORTNESS OF BREATH  *UNUSUAL BRUISING OR BLEEDING  TENDERNESS IN MOUTH AND THROAT WITH OR WITHOUT PRESENCE OF ULCERS  *URINARY PROBLEMS  *BOWEL PROBLEMS  UNUSUAL RASH Items with * indicate a potential emergency and should be followed up as soon as possible.  Feel free to call the clinic should you have any questions or concerns. The clinic phone number is [phone].  Please show the CHEMO ALERT CARD at check-in to the Emergency Department and triage nurse.  Coronavirus (COVID-19) Are you at risk?  Are you at risk for the Coronavirus (COVID-19)?  To be considered HIGH RISK for Coronavirus (COVID-19), you have to meet the following criteria:  . Traveled to [place], [place], [place], [place] or [place]; or in the [place] to [place], [place], [place], or [place]; and have fever, cough, and shortness of breath within the last 2 weeks of travel OR . Been in close contact with a person diagnosed with COVID-19 within the last 2 weeks and have fever, cough, and shortness of breath . IF YOU DO NOT MEET THESE CRITERIA, YOU ARE CONSIDERED LOW RISK FOR COVID-19.  What to do if you are HIGH RISK for COVID-19?  . If you are having a medical emergency, call 911. . Seek medical care right away. Before you go to a doctor's office, urgent care or emergency department, call ahead and tell them  about your recent travel, contact with someone diagnosed with COVID-19, and your symptoms. You should receive instructions from your physician's office regarding next steps of care.  . When you arrive at healthcare provider, tell the healthcare staff immediately you have returned from visiting [place], [place], [place], [place] or [place]; or traveled in the [place] to [place], [place], [place], or [place]; in the last two weeks or you have been in close contact with a person diagnosed with COVID-19 in the last 2 weeks.   . Tell the health care staff about your symptoms: fever, cough and shortness of breath. . After you have been seen by a medical provider, you will be either: o Tested for (COVID-19) and discharged home on quarantine except to seek medical care if symptoms worsen, and asked to  - Stay home and avoid contact with others until you get your results (4-5 days)  - Avoid travel on public transportation if possible (such as bus, train, or airplane) or o Sent to the Emergency Department by EMS for evaluation, COVID-19 testing, and possible admission depending on your condition and test results.  What to do if you are LOW RISK for COVID-19?  Reduce your risk of any infection by using the same precautions used for avoiding the common cold or flu:  . Wash your hands often with soap and warm water for at least 20 seconds.  If soap and water are not readily available, use an alcohol-based hand sanitizer with at least 60% alcohol.  .   If coughing or sneezing, cover your mouth and nose by coughing or sneezing into the elbow areas of your shirt or coat, into a tissue or into your sleeve (not your hands). . Avoid shaking hands with others and consider head nods or verbal greetings only. . Avoid touching your eyes, nose, or mouth with unwashed hands.  . Avoid close contact with people who are sick. . Avoid places or events with large numbers of people in one location, like concerts or  sporting events. . Carefully consider travel plans you have or are making. . If you are planning any travel outside or inside the [place], visit the CDC's Travelers' Health webpage for the latest health notices. . If you have some symptoms but not all symptoms, continue to monitor at home and seek medical attention if your symptoms worsen. . If you are having a medical emergency, call 911.   ADDITIONAL HEALTHCARE OPTIONS FOR PATIENTS   Telehealth / e-Visit: [email or web address]         [facility]: [phone]  [facility]: [phone]                   [facility]: [phone]   

## 2018-08-24 NOTE — Progress Notes (Signed)
[facility] OFFICE PROGRESS NOTE  Patient Care Team: Nolene Ebbs, MD as PCP - General (Internal Medicine)  ASSESSMENT & PLAN:  Uterine cancer ([place]) Overall, she tolerated treatment well except for recent pain and constipation which has subsequently resolved She had recent cellulitis but it is improving We will proceed with treatment without dose adjustment I plan to repeat imaging study in 3 weeks to have objective assessment of response of therapy Ideally, I would like to proceed with 6 cycles of treatment before stopping  Cancer associated pain Her pain is well controlled She did not request further medication refill for pain  Cellulitis of left leg She had completed a recent course of antibiotics With her permission, I took a picture of the wound and it appears to be healing well with healthy granulation tissue I recommend dry dressing changes only We will monitor closely  Lower leg DVT (deep venous thromboembolism), chronic, left ([place]) She denies worsening leg swelling after radiation treatment I refill her prescription Xarelto I plan to repeat CT imaging to evaluate her cancer situation and that should give [place] an idea how her venous drainage look like  Pancytopenia, acquired ([place]) She has acquired pancytopenia from treatment She is not symptomatic Observe only   Orders Placed This Encounter  Procedures  . CT ABDOMEN PELVIS W CONTRAST    Standing Status:   Future    Standing Expiration Date:   08/24/2019    Order Specific Question:   If indicated for the ordered procedure, I authorize the administration of contrast media per Radiology protocol    Answer:   Yes    Order Specific Question:   Preferred imaging location?    Answer:   [facility]    Order Specific Question:   Radiology Contrast Protocol - do NOT remove file path    Answer:   [email or web address]    INTERVAL HISTORY: Please see below for problem oriented  charting. 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 Uterus +/- tubes/ovaries, neoplastic, with left fallopian tube and ovary - INVASIVE ENDOMETRIOID CARCINOMA (1.5 CM), FIGO GRADE II, ARISING IN A BACKGROUND OF ATYPICAL COMPLEX HYPERPLASIA, CONFINED WITHIN INNER HALF OF THE MYOMETRIUM. - ENDOMETRIAL POLYP WITH ASSOCIATED ATYPICAL COMPLEX HYPERPLASIA. - MYOMETRIUM: LEIOMYOMATA. - CERVIX: BENIGN SQUAMOUS MUCOSA AND ENDOCERVICAL MUCOSA, NO DYSPLASIA OR MALIGNANCY. - LEFT OVARY: BENIGN OVARIAN TISSUE WITH ENDOSALPINGOSIS, NO EVIDENCE OF ATYPIA OR MALIGNANCY. - LEFT FALLOPIAN TUBE: NO HISTOLOGIC ABNORMALITIES. - PLEASE SEE ONCOLOGY TEMPLATE FOR DETAIL. 2. Ovary and fallopian tube, right - BENIGN OVARIAN TISSUE WITH ENDOSALPINGOSIS, NO ATYPIA OR MALIGNANCY. - BENIGN FALLOPIAN TUBAL TISSUE, NO PATHOLOGIC ABNORMALITIES. Microscopic Comment 1. UTERUS Specimen: Uterus, cervix, bilateral ovaries and fallopian tubes Procedure: Total hysterectomy and bilateral salpingo-oophorectomy Lymph node sampling performed: No Specimen integrity: Intact Maximum tumor size (cm): 1.5 cm, glass slide measurement Histologic type: Invasive endometrioid carcinoma Grade: FIGO grade II Myometrial invasion: 1 cm where myometrium is 2.3 cm in thickness Cervical stromal involvement: No Extent of involvement  of other organs: No Lymph vascular invasion: Not  identified Peritoneal washings: Negative ([id number]) Lymph nodes: number examined N/A; number positive N/A TNM code: pT1a, pNX 1 oFf 3IGO Stage (based on pathologic findings, needs clinical correlation): IA  Comments: Sections the endomyometrium away from the grossly identified endometrial polyp show an invasive FIGO grade II endometrioid carcinoma. The tumor is confined within inner half of the myometrium. No angiolymphatic invasion is identified. No cervical stromal involvement is identified. Sections of the grossly identified endometrial polyp show an endometrial polyp with associated atypical compacted hyperplasia with no definitive evidence of carcinoma.   12/07/2017 Imaging   US venous Doppler Right: No evidence of common femoral vein obstruction. Left: Findings consistent with acute deep vein thrombosis involving the left femoral vein, left proximal profunda vein, and left popliteal vein. Unable to adequately interrogate the common femoral and higher, or the calf secondary to significant edema and body habitus   12/07/2017 [facility] Admission   She presented to the ER and was diagnosed with acute DVT   01/18/2018 - 01/21/2018 Hospital Admission   She was admitted to the hospital for management of severe persistent DVT   01/18/2018 Imaging   US venous Doppler Right: No evidence of common femoral vein obstruction. Left: Findings consistent with acute deep vein thrombosis involving the left common femoral vein, and left popliteal vein.   01/19/2018 Surgery   Pre-operative Diagnosis: Subacute DVT with severe post thrombotic syndrome Post-operative diagnosis:  Same Surgeon:  Erlene Quan C. Donzetta Matters, MD Procedure Performed: 1.  Ultrasound-guided cannulation left small saphenous vein 2.  Left lower extremity and central venography 3.  Intravascular ultrasound of left popliteal, femoral, common femoral, external and common iliac veins and IVC 4.  Stent of left common and external iliac veins  with 14 x 60 mm Vici 5.  Moderate sedation with fentanyl and Versed for 50 minutes  Indications: 73 year old female with a history of DVT in October now presents with persistent left lower extremity swelling and ultrasound demonstrating likely persistent DVT.  She has been on Xarelto at this time.  She is now indicated for venogram possible intervention.  Findings: Flow in the left lower extremity was stagnant throughout but by venogram all veins were patent.  There was a focal occlusive area approximately 2 cm in length at the common and external iliac vein junction at the hypogastric on the left.  After stenting and ballooning we had a diameter of 12 millimeters in the stent and venogram demonstrated flow in the lower extremity veins were previously was stagnant and no further residual stenosis in the left common and external iliac vein junction.   04/12/2018 Imaging   US Venous Doppler Right: No evidence of common femoral vein obstruction. Left: There is no evidence of deep vein thrombosis in the lower extremity. However, portions of this examination were limited- see technologist comments above. Left groin: Large hypoechoic area with mixed echoes noted measuring nearly 10 cm. Possible  hematoma versus unknown etiology. Ultrasound characteristics of enlarged lymph nodes noted in the groin.      05/15/2018 Imaging   US Venous Doppler Right: No evidence of deep vein thrombosis in the lower extremity. No indirect evidence of obstruction proximal to the inguinal ligament. Left: No reflux was noted in the common femoral vein , femoral vein in the thigh, popliteal vein, great saphenous vein at the saphenofemoral junction, great saphenous vein at the proximal thigh, great saphenous vein at the mid thigh, great saphenous vein  at the distal  thigh, great saphenous vein at the knee, origin of the small saphenous vein, proximal small saphenous vein, and mid small saphenous vein. There is no evidence of  deep vein thrombosis in the lower extremity. There is no evidence of superficial venous thrombosis. No cystic structure found in the popliteal fossa. Unable to evaluate extension of common femoral vein obstruction proximal to the inguinal ligament.   06/01/2018 Imaging   1. 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 Additionally, there is lucency involving the medial sidewall of the left acetabulum with potential nondisplaced pathologic fracture. Further evaluation with contrast-enhanced pelvic MRI could be performed as clinically indicated. 2. The left pelvic arterial and venous system is encased by this infiltrative left pelvic sidewall mass however while difficult to ascertain, the left external iliac venous stent appears patent.   06/18/2018 Pathology Results   Lymph node for lymphoma, Left Inguinal - METASTATIC ADENOCARCINOMA, SEE COMMENT. Microscopic Comment Immunohistochemistry is positive for cytokeratin 7, PAX8, ER, and PR. Cytokeratin 5/6,and p63 are negative. The immunoprofile along with the patient's history are consistent with a gynecologic primary.   06/18/2018 Surgery   Pre-op Diagnosis: INGUINAL LYMPHADENOPATHY, PELVIC MASS     Procedure(s): EXCISIONAL BIOPSY DEEP LEFT INGUINAL LYMPH NODE  Surgeon(s): Coralie Keens, MD    06/24/2018 Cancer Staging   Staging form: Corpus Uteri - Carcinoma and Carcinosarcoma, AJCC 8th Edition - Clinical: Stage IVB (cT1a, cN2, pM1) - Signed by [name], MD on 06/24/2018    Genetic Testing   Patient has genetic testing done for MMR on pathology from 06/18/2018. Results revealed patient has the following mutation(s): MMR: abnormal   06/29/2018 Procedure   Placement of a subcutaneous port device. Catheter tip at the SVC and right atrium junction.    Genetic Testing   Patient has genetic testing done for MSI on pathology from  06/18/2018. Results revealed patient has the following mutation(s): MSI: High   07/02/2018 PET scan   Previous hysterectomy, with asymmetric focus of hypermetabolic activity in the left vaginal cuff, suspicious for residual or recurrent carcinoma.  Large hypermetabolic soft tissue mass involving the left pelvic sidewall and acetabulum, consistent with metastatic disease.  No evidence metastatic disease within the abdomen, chest, or neck.   07/09/2018 Tumor Marker   Patient's tumor was tested for the following markers: CA-125 Results of the tumor marker test revealed 9   07/10/2018 -  Chemotherapy   The patient had carboplatin and taxol   07/17/2018 Genetic Testing   Negative genetic testing on the common hereditary cancer panel.  The Common Hereditary Gene Panel offered by [facility] includes sequencing and/or deletion duplication testing of the following 48 genes: APC, ATM, AXIN2, BARD1, BMPR1A, BRCA1, BRCA2, BRIP1, CDH1, CDK4, CDKN2A (p14ARF), CDKN2A (p16INK4a), CHEK2, CTNNA1, DICER1, EPCAM (Deletion/duplication testing only), GREM1 (promoter region deletion/duplication testing only), KIT, MEN1, MLH1, MSH2, MSH3, MSH6, MUTYH, NBN, NF1, NHTL1, PALB2, PDGFRA, PMS2, POLD1, POLE, PTEN, RAD50, RAD51C, RAD51D, RNF43, SDHB, SDHC, SDHD, SMAD4, SMARCA4. STK11, TP53, TSC1, TSC2, and VHL.  The following genes were evaluated for sequence changes only: SDHA and HOXB13 c.251G>A variant only. The report date is Jul 17, 2018.    Metastasis to lymph nodes ([place])  06/23/2018 Initial Diagnosis   Metastasis to lymph nodes ([place])   07/10/2018 -  Chemotherapy   The patient had palonosetron (ALOXI) injection 0.25 mg, 0.25 mg, Intravenous,  Once, 3 of 6 cycles Administration: 0.25 mg (07/10/2018), 0.25 mg (07/31/2018) CARBOplatin (PARAPLATIN) 480  mg in sodium chloride 0.9 % 250 mL chemo infusion, 480 mg (100 % of original dose 482.5 mg), Intravenous,  Once, 3 of 6 cycles Dose modification: 482.5 mg (original dose 482.5  mg, Cycle 1) Administration: 480 mg (07/10/2018), 480 mg (07/31/2018) PACLitaxel (TAXOL) 276 mg in sodium chloride 0.9 % 250 mL chemo infusion (> 2m/m2), 140 mg/m2 = 276 mg (80 % of original dose 175 mg/m2), Intravenous,  Once, 3 of 6 cycles Dose modification: 140 mg/m2 (80 % of original dose 175 mg/m2, Cycle 1, Reason: Dose Not Tolerated) Administration: 276 mg (07/10/2018), 276 mg (07/31/2018) fosaprepitant (EMEND) 150 mg, dexamethasone (DECADRON) 12 mg in sodium chloride 0.9 % 145 mL IVPB, , Intravenous,  Once, 3 of 6 cycles Administration:  (07/10/2018),  (07/31/2018)  for chemotherapy treatment.    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 [place]  06/24/2018 Initial Diagnosis   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 ([place]   07/10/2018 -  Chemotherapy   The patient had palonosetron (ALOXI) injection 0.25 mg, 0.25 mg, Intravenous,  Once, 1 of 6 cycles Administration: 0.25 mg (07/10/2018) CARBOplatin (PARAPLATIN) 480 mg in sodium chloride 0.9 % 250 mL chemo infusion, 480 mg (100 % of original dose 482.5 mg), Intravenous,  Once, 1 of 6 cycles Dose modification: 482.5 mg (original dose 482.5 mg, Cycle 1) Administration: 480 mg (07/10/2018) PACLitaxel (TAXOL) 276 mg in sodium chloride 0.9 % 250 mL chemo infusion (> 852mm2), 140 mg/m2 = 276 mg (80 % of original dose 175 mg/m2), Intravenous,  Once, 1 of 6 cycles Dose modification: 140 mg/m2 (80 % of original dose 175 mg/m2, Cycle 1, Reason: Dose Not Tolerated) Administration: 276 mg (07/10/2018) fosaprepitant (EMEND) 150 mg, dexamethasone (DECADRON) 12 mg in sodium chloride 0.9 % 145 mL IVPB, , Intravenous,  Once, 1 of 6 cycles Administration:  (07/10/2018)  for chemotherapy treatment.      REVIEW OF SYSTEMS:   Constitutional: Denies fevers, chills or abnormal weight loss Eyes: Denies blurriness of vision Ears, nose, mouth, throat, and face: Denies mucositis or sore throat Respiratory: Denies cough, dyspnea or wheezes Cardiovascular: Denies palpitation, chest discomfort or lower extremity  swelling Gastrointestinal:  Denies nausea, heartburn or change in bowel habits Skin: Denies abnormal skin rashes Lymphatics: Denies new lymphadenopathy or easy bruising Neurological:Denies numbness, tingling or new weaknesses Behavioral/Psych: Mood is stable, no new changes  All other systems were reviewed with the patient and are negative.  I have reviewed the past medical history, past surgical history, social history and family history with the patient and they are unchanged from previous note.  ALLERGIES:  has No Known Allergies.  MEDICATIONS:  Current Outpatient Medications  Medication Sig Dispense Refill  . clopidogrel (PLAVIX) 75 MG tablet Take 1 tablet (75 mg total) by mouth daily with breakfast. 30 tablet 11  . cycloSPORINE (RESTASIS) 0.05 % ophthalmic emulsion Place 1 drop into both eyes 2 (two) times daily.    . [place] Kitchenexamethasone (DECADRON) 4 MG tablet Take 2 tabs at the night before and 2 tabs the morning of chemotherapy, every 3 weeks, by mouth (Patient taking differently: Take 8 mg by mouth See admin instructions. Take 8 mg the night before chemo and 8 mg the morning of chemotherapy every 3 weeks) 24 tablet 0  . lansoprazole (PREVACID) 30 MG capsule Take 1 capsule (30 mg total) by mouth daily at 12 noon. 30 capsule 5  . lidocaine-prilocaine (EMLA) cream Apply to affected area once 30 g 3  . morphine (MS CONTIN) 30 MG 12 hr tablet Take 1 tablet (30 mg total)  by mouth every 12 (twelve) hours. (Patient taking differently: Take 30 mg by mouth every 12 (twelve) hours as needed for pain. ) 60 tablet 0  . morphine (MSIR) 15 MG tablet Take 1 tablet (15 mg total) by mouth every 6 (six) hours as needed for severe pain. 60 tablet 0  . mupirocin ointment (BACTROBAN) 2 % Apply to left lower extremity wounds 3 times daily 30 g 2  . Olopatadine HCl 0.2 % SOLN Place 1 drop into both eyes daily.    . ondansetron (ZOFRAN ODT) 4 MG disintegrating tablet 53m ODT q4 hours prn nausea/vomit 20 tablet 0   . ondansetron (ZOFRAN) 8 MG tablet Take 1 tablet (8 mg total) by mouth every 8 (eight) hours as needed. (Patient taking differently: Take 8 mg by mouth every 8 (eight) hours as needed for nausea or vomiting. ) 30 tablet 1  . polyethylene glycol (MIRALAX / GLYCOLAX) 17 g packet Take 17 g by mouth daily. 30 each 11  . potassium chloride SA (K-DUR) 20 MEQ tablet Take 1 tablet (20 mEq total) by mouth daily. 7 tablet 0  . prochlorperazine (COMPAZINE) 10 MG tablet Take 1 tablet (10 mg total) by mouth every 6 (six) hours as needed (Nausea or vomiting). (Patient taking differently: Take 10 mg by mouth every 6 (six) hours as needed for nausea or vomiting. ) 30 tablet 1  . rivaroxaban (XARELTO) 20 MG TABS tablet Take 1 tablet (20 mg total) by mouth daily with breakfast. 30 tablet 11  . traMADol (ULTRAM) 50 MG tablet      No current facility-administered medications for this visit.    Facility-Administered Medications Ordered in Other Visits  Medication Dose Route Frequency Provider Last Rate Last Dose  . CARBOplatin (PARAPLATIN) 480 mg in sodium chloride 0.9 % 250 mL chemo infusion  480 mg Intravenous Once GAlvy Bimler Dewayne Jurek, MD      . famotidine (PEPCID) IVPB 20 mg premix  20 mg Intravenous Once GAlvy Bimler Mekhi Sonn, MD 200 mL/hr at 08/24/18 1135 20 mg at 08/24/18 1135  . fosaprepitant (EMEND) 150 mg, dexamethasone (DECADRON) 12 mg in sodium chloride 0.9 % 145 mL IVPB   Intravenous Once GAlvy Bimler Dannell Gortney, MD      . heparin lock flush 100 unit/mL  500 Units Intracatheter Once PRN GAlvy Bimler Rajean Desantiago, MD      . PACLitaxel (TAXOL) 276 mg in sodium chloride 0.9 % 250 mL chemo infusion (> 826mm2)  140 mg/m2 (Treatment Plan Recorded) Intravenous Once Wael Maestas, MD      . sodium chloride flush (NS) 0.9 % injection 10 mL  10 mL Intracatheter PRN GoAlvy BimlerNi, MD        PHYSICAL EXAMINATION: ECOG PERFORMANCE STATUS: 2 - Symptomatic, <50% confined to bed  Vitals:   08/24/18 1014  BP: 117/76  Pulse: 99  Resp: 18  Temp: 98.2 F (36.8  C)  SpO2: 100%   Filed Weights   08/24/18 1014  Weight: 206 lb (93.4 kg)    GENERAL:alert, no distress and comfortable SKIN: I place clean dry dressing over her left lower extremity EYES: normal, Conjunctiva are pink and non-injected, sclera clear OROPHARYNX:no exudate, no erythema and lips, buccal mucosa, and tongue normal  NECK: supple, thyroid normal size, non-tender, without nodularity LYMPH:  no palpable lymphadenopathy in the cervical, axillary or inguinal LUNGS: clear to auscultation and percussion with normal breathing effort HEART: regular rate & rhythm and no murmurs severe bilateral lower extremity edema ABDOMEN:abdomen soft, non-tender and normal bowel sounds Musculoskeletal:no cyanosis of digits  and no clubbing  NEURO: alert & oriented x 3 with fluent speech, no focal motor/sensory deficits  LABORATORY DATA:  I have reviewed the data as listed    Component Value Date/Time   NA 138 08/24/2018 0927   K 3.4 (L) 08/24/2018 0927   CL 105 08/24/2018 0927   CO2 22 08/24/2018 0927   GLUCOSE 124 (H) 08/24/2018 0927   BUN 8 08/24/2018 0927   CREATININE 0.72 08/24/2018 0927   CALCIUM 9.2 08/24/2018 0927   PROT 7.7 08/24/2018 0927   ALBUMIN 3.1 (L) 08/24/2018 0927   AST 14 (L) 08/24/2018 0927   ALT 10 08/24/2018 0927   ALKPHOS 71 08/24/2018 0927   BILITOT 0.4 08/24/2018 0927   GFRNONAA >60 08/24/2018 0927   GFRAA >60 08/24/2018 0927    No results found for: SPEP, UPEP  Lab Results  Component Value Date   WBC 3.7 (L) 08/24/2018   NEUTROABS 3.3 08/24/2018   HGB 8.1 (L) 08/24/2018   HCT 25.1 (L) 08/24/2018   MCV 87.2 08/24/2018   PLT 216 08/24/2018      Chemistry      Component Value Date/Time   NA 138 08/24/2018 0927   K 3.4 (L) 08/24/2018 0927   CL 105 08/24/2018 0927   CO2 22 08/24/2018 0927   BUN 8 08/24/2018 0927   CREATININE 0.72 08/24/2018 0927      Component Value Date/Time   CALCIUM 9.2 08/24/2018 0927   ALKPHOS 71 08/24/2018 0927   AST 14  (L) 08/24/2018 0927   ALT 10 08/24/2018 0927   BILITOT 0.4 08/24/2018 0927         All questions were answered. The patient knows to call the clinic with any problems, questions or concerns. No barriers to learning was detected.  I spent 25 minutes counseling the patient face to face. The total time spent in the appointment was 30 minutes and more than 50% was on counseling and review of test results  [name], MD 08/24/2018 11:46 AM

## 2018-08-24 NOTE — Assessment & Plan Note (Signed)
Overall, she tolerated treatment well except for recent pain and constipation which has subsequently resolved She had recent cellulitis but it is improving We will proceed with treatment without dose adjustment I plan to repeat imaging study in 3 weeks to have objective assessment of response of therapy Ideally, I would like to proceed with 6 cycles of treatment before stopping

## 2018-08-24 NOTE — Assessment & Plan Note (Signed)
Her pain is well controlled She did not request further medication refill for pain

## 2018-08-28 ENCOUNTER — Ambulatory Visit: Payer: Medicare Other | Admitting: Vascular Surgery

## 2018-09-07 ENCOUNTER — Encounter (HOSPITAL_COMMUNITY): Payer: Self-pay

## 2018-09-07 ENCOUNTER — Telehealth: Payer: Self-pay | Admitting: *Deleted

## 2018-09-07 ENCOUNTER — Other Ambulatory Visit: Payer: Self-pay

## 2018-09-07 ENCOUNTER — Inpatient Hospital Stay (HOSPITAL_COMMUNITY)
Admission: EM | Admit: 2018-09-07 | Discharge: 2018-09-10 | DRG: 270 | Disposition: A | Discharge status: Home Health | Payer: Medicare Other | Attending: Internal Medicine | Admitting: Internal Medicine

## 2018-09-07 ENCOUNTER — Emergency Department (HOSPITAL_BASED_OUTPATIENT_CLINIC_OR_DEPARTMENT_OTHER): Payer: Medicare Other

## 2018-09-07 DIAGNOSIS — C569 Malignant neoplasm of unspecified ovary: Secondary | ICD-10-CM | POA: Diagnosis present

## 2018-09-07 DIAGNOSIS — Z90710 Acquired absence of both cervix and uterus: Secondary | ICD-10-CM

## 2018-09-07 DIAGNOSIS — Z8542 Personal history of malignant neoplasm of other parts of uterus: Secondary | ICD-10-CM

## 2018-09-07 DIAGNOSIS — I82412 Acute embolism and thrombosis of left femoral vein: Secondary | ICD-10-CM

## 2018-09-07 DIAGNOSIS — S81802S Unspecified open wound, left lower leg, sequela: Secondary | ICD-10-CM

## 2018-09-07 DIAGNOSIS — M8458XD Pathological fracture in neoplastic disease, other specified site, subsequent encounter for fracture with routine healing: Secondary | ICD-10-CM | POA: Diagnosis present

## 2018-09-07 DIAGNOSIS — I824Y2 Acute embolism and thrombosis of unspecified deep veins of left proximal lower extremity: Secondary | ICD-10-CM | POA: Diagnosis not present

## 2018-09-07 DIAGNOSIS — M170 Bilateral primary osteoarthritis of knee: Secondary | ICD-10-CM | POA: Diagnosis present

## 2018-09-07 DIAGNOSIS — D649 Anemia, unspecified: Secondary | ICD-10-CM

## 2018-09-07 DIAGNOSIS — K219 Gastro-esophageal reflux disease without esophagitis: Secondary | ICD-10-CM | POA: Diagnosis present

## 2018-09-07 DIAGNOSIS — D61810 Antineoplastic chemotherapy induced pancytopenia: Secondary | ICD-10-CM | POA: Diagnosis not present

## 2018-09-07 DIAGNOSIS — Z87891 Personal history of nicotine dependence: Secondary | ICD-10-CM

## 2018-09-07 DIAGNOSIS — Z6841 Body Mass Index (BMI) 40.0 and over, adult: Secondary | ICD-10-CM | POA: Diagnosis not present

## 2018-09-07 DIAGNOSIS — Z96653 Presence of artificial knee joint, bilateral: Secondary | ICD-10-CM | POA: Diagnosis not present

## 2018-09-07 DIAGNOSIS — I82402 Acute embolism and thrombosis of unspecified deep veins of left lower extremity: Secondary | ICD-10-CM | POA: Diagnosis present

## 2018-09-07 DIAGNOSIS — Z7902 Long term (current) use of antithrombotics/antiplatelets: Secondary | ICD-10-CM

## 2018-09-07 DIAGNOSIS — I82512 Chronic embolism and thrombosis of left femoral vein: Secondary | ICD-10-CM | POA: Diagnosis not present

## 2018-09-07 DIAGNOSIS — R601 Generalized edema: Secondary | ICD-10-CM | POA: Diagnosis not present

## 2018-09-07 DIAGNOSIS — S81809A Unspecified open wound, unspecified lower leg, initial encounter: Secondary | ICD-10-CM | POA: Insufficient documentation

## 2018-09-07 DIAGNOSIS — Z79891 Long term (current) use of opiate analgesic: Secondary | ICD-10-CM

## 2018-09-07 DIAGNOSIS — Z79899 Other long term (current) drug therapy: Secondary | ICD-10-CM | POA: Diagnosis not present

## 2018-09-07 DIAGNOSIS — Z8249 Family history of ischemic heart disease and other diseases of the circulatory system: Secondary | ICD-10-CM | POA: Diagnosis not present

## 2018-09-07 DIAGNOSIS — Z803 Family history of malignant neoplasm of breast: Secondary | ICD-10-CM | POA: Diagnosis not present

## 2018-09-07 DIAGNOSIS — C779 Secondary and unspecified malignant neoplasm of lymph node, unspecified: Secondary | ICD-10-CM | POA: Diagnosis not present

## 2018-09-07 DIAGNOSIS — R609 Edema, unspecified: Secondary | ICD-10-CM

## 2018-09-07 DIAGNOSIS — T82868A Thrombosis of vascular prosthetic devices, implants and grafts, initial encounter: Secondary | ICD-10-CM | POA: Diagnosis not present

## 2018-09-07 DIAGNOSIS — Z7901 Long term (current) use of anticoagulants: Secondary | ICD-10-CM

## 2018-09-07 DIAGNOSIS — E876 Hypokalemia: Secondary | ICD-10-CM | POA: Diagnosis not present

## 2018-09-07 DIAGNOSIS — Z9049 Acquired absence of other specified parts of digestive tract: Secondary | ICD-10-CM | POA: Diagnosis not present

## 2018-09-07 DIAGNOSIS — H9190 Unspecified hearing loss, unspecified ear: Secondary | ICD-10-CM | POA: Diagnosis not present

## 2018-09-07 DIAGNOSIS — T451X5A Adverse effect of antineoplastic and immunosuppressive drugs, initial encounter: Secondary | ICD-10-CM | POA: Diagnosis present

## 2018-09-07 DIAGNOSIS — Z1159 Encounter for screening for other viral diseases: Secondary | ICD-10-CM

## 2018-09-07 DIAGNOSIS — C7951 Secondary malignant neoplasm of bone: Secondary | ICD-10-CM | POA: Diagnosis present

## 2018-09-07 DIAGNOSIS — Z923 Personal history of irradiation: Secondary | ICD-10-CM

## 2018-09-07 DIAGNOSIS — D61818 Other pancytopenia: Secondary | ICD-10-CM | POA: Diagnosis present

## 2018-09-07 DIAGNOSIS — Y838 Other surgical procedures as the cause of abnormal reaction of the patient, or of later complication, without mention of misadventure at the time of the procedure: Secondary | ICD-10-CM | POA: Diagnosis not present

## 2018-09-07 DIAGNOSIS — C55 Malignant neoplasm of uterus, part unspecified: Secondary | ICD-10-CM | POA: Diagnosis not present

## 2018-09-07 DIAGNOSIS — S32409A Unspecified fracture of unspecified acetabulum, initial encounter for closed fracture: Secondary | ICD-10-CM | POA: Diagnosis present

## 2018-09-07 LAB — CBC WITH DIFFERENTIAL/PLATELET
Abs Immature Granulocytes: 0.13 10*3/uL — ABNORMAL HIGH (ref 0.00–0.07)
Basophils Absolute: 0 10*3/uL (ref 0.0–0.1)
Basophils Relative: 1 %
Eosinophils Absolute: 0 10*3/uL (ref 0.0–0.5)
Eosinophils Relative: 0 %
HCT: 27.4 % — ABNORMAL LOW (ref 36.0–46.0)
Hemoglobin: 8.3 g/dL — ABNORMAL LOW (ref 12.0–15.0)
Immature Granulocytes: 5 %
Lymphocytes Relative: 29 %
Lymphs Abs: 0.7 10*3/uL (ref 0.7–4.0)
MCH: 27.6 pg (ref 26.0–34.0)
MCHC: 30.3 g/dL (ref 30.0–36.0)
MCV: 91 fL (ref 80.0–100.0)
Monocytes Absolute: 0.5 10*3/uL (ref 0.1–1.0)
Monocytes Relative: 18 %
Neutro Abs: 1.2 10*3/uL — ABNORMAL LOW (ref 1.7–7.7)
Neutrophils Relative %: 47 %
Platelets: 242 10*3/uL (ref 150–400)
RBC: 3.01 MIL/uL — ABNORMAL LOW (ref 3.87–5.11)
RDW: 18.7 % — ABNORMAL HIGH (ref 11.5–15.5)
WBC: 2.6 10*3/uL — ABNORMAL LOW (ref 4.0–10.5)
nRBC: 0 % (ref 0.0–0.2)

## 2018-09-07 LAB — SARS CORONAVIRUS 2 BY RT PCR (HOSPITAL ORDER, PERFORMED IN ~~LOC~~ HOSPITAL LAB): SARS Coronavirus 2: NEGATIVE

## 2018-09-07 LAB — COMPREHENSIVE METABOLIC PANEL
ALT: 18 U/L (ref 0–44)
AST: 19 U/L (ref 15–41)
Albumin: 3.6 g/dL (ref 3.5–5.0)
Alkaline Phosphatase: 73 U/L (ref 38–126)
Anion gap: 13 (ref 5–15)
BUN: 10 mg/dL (ref 8–23)
CO2: 22 mmol/L (ref 22–32)
Calcium: 9.5 mg/dL (ref 8.9–10.3)
Chloride: 101 mmol/L (ref 98–111)
Creatinine, Ser: 0.64 mg/dL (ref 0.44–1.00)
GFR calc Af Amer: >60 mL/min (ref >60)
GFR calc non Af Amer: >60 mL/min (ref >60)
Glucose, Bld: 102 mg/dL — ABNORMAL HIGH (ref 70–99)
Potassium: 3.3 mmol/L — ABNORMAL LOW (ref 3.5–5.1)
Sodium: 136 mmol/L (ref 135–145)
Total Bilirubin: 0.7 mg/dL (ref 0.3–1.2)
Total Protein: 8.1 g/dL (ref 6.5–8.1)

## 2018-09-07 LAB — HEPARIN LEVEL (UNFRACTIONATED): Heparin Unfractionated: >2.2 IU/mL — ABNORMAL HIGH (ref 0.30–0.70)

## 2018-09-07 LAB — APTT: aPTT: 43 seconds — ABNORMAL HIGH (ref 24–36)

## 2018-09-07 MED ORDER — HEPARIN (PORCINE) 25000 UT/250ML-% IV SOLN
1200.0000 [IU]/h | INTRAVENOUS | Status: DC
  Administered 2018-09-08: 1200 [IU]/h via INTRAVENOUS
  Filled 2018-09-07: qty 250

## 2018-09-07 NOTE — ED Notes (Signed)
[facility] her to transport patient.

## 2018-09-07 NOTE — ED Provider Notes (Signed)
[facility] DEPT Provider Note   CSN: [id number] Arrival date & time: 09/07/18  1135    History   Chief Complaint Chief Complaint  Patient presents with  . Leg Swelling    HPI [name] is a 73 y.o. female.     HPI   She is here for evaluation of draining wounds of her left lower leg.  She has been on antibiotics recently.  She contacted her oncologist today, and was referred here because of question of infection versus ischemia of the left leg.  This was based on telephone call.  Patient denies headache, fever, chills, nausea, vomiting, focal weakness or paresthesia.  She is currently being treated for ovarian cancer with chemotherapy.  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  Xarelto.  There are no other known modifying factors.  Past Medical History:  Diagnosis Date  . Acute upper respiratory infection 07/06/2014  . Anemia   . Arthritis    Back   . Colon polyp    Tubular Adenoma   . Cough productive of clear sputum 06/22/2014  . Family history of breast cancer   . GERD (gastroesophageal reflux disease)   . History of right bundle branch block (RBBB)   . HOH (hard of hearing)   . Hypertension    had in the past, is no longer on medication for this and blood pressures are WNL  . 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 Primary localized osteoarthritis of right knee   . Uterine cancer ([place]) 06/23/2018    Patient Active Problem List   Diagnosis Date Noted  . Cellulitis of left leg 08/24/2018  . Pancytopenia, acquired ([place]) 07/21/2018  . Genetic testing 07/20/2018  . Family history of breast cancer   . 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 Other constipation 06/24/2018  . Goals of care, counseling/discussion 06/24/2018  . 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 ([place]) 06/24/2018   . Uterine cancer ([place]) 06/23/2018  . 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 Lower leg DVT (deep venous thromboembolism), chronic, left ([place]) 06/23/2018  . DVT (deep venous thrombosis) ([place]) 01/18/2018  . DJD (degenerative joint disease) of knee 07/04/2014  . Primary localized osteoarthritis of right knee   . Arthritis   . Anemia   . GERD (gastroesophageal reflux disease)   . Colon polyp   . HOH (hard of hearing)   . Postoperative anemia due to acute blood loss 05/01/2011  . 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 ABDOMINAL HYSTERECTOMY  2012  . CHOLECYSTECTOMY N/A 03/09/2013   Procedure: LAPAROSCOPIC CHOLECYSTECTOMY;  Surgeon: Gayland Curry, MD;  Location: [facility];  Service: General;  Laterality: N/A;  . COLONOSCOPY W/ BIOPSIES    . IR IMAGING GUIDED PORT INSERTION  06/29/2018  . LARYNGOSCOPY Left 03/14/2017   Procedure: LARYNGOSCOPY;  Surgeon: Helayne Seminole, MD;  Location: [facility];  Service: ENT;  Laterality: Left;  . LOWER EXTREMITY VENOGRAPHY Left 01/19/2018   Procedure: LOWER EXTREMITY VENOGRAPHY;  Surgeon: Waynetta Sandy, MD;  Location: [facility];  Service: Cardiovascular;  Laterality: Left;  . LYMPH NODE BIOPSY Left 06/18/2018   Procedure: EXCISIONAL BIOPSY LEFT INGUINAL LYMPH NODE;  Surgeon: Coralie Keens, MD;  Location: [facility];  Service: General;  Laterality: Left;  . PERIPHERAL VASCULAR INTERVENTION Left  01/19/2018   Procedure: PERIPHERAL VASCULAR INTERVENTION;  Surgeon: Waynetta Sandy, MD;  Location: [facility];  Service: Cardiovascular;  Laterality: Left;  LEFT ILIAC VENOUS  . TOTAL KNEE ARTHROPLASTY  04/29/2011   Procedure: TOTAL KNEE ARTHROPLASTY;  Surgeon: Lorn Junes, MD;  Location: [facility];  Service: Orthopedics;  Laterality: Left;  DR [place] THIS CASE  . TOTAL KNEE ARTHROPLASTY Right 07/04/2014   Procedure: TOTAL KNEE ARTHROPLASTY;  Surgeon: Elsie Saas,  MD;  Location: [facility];  Service: Orthopedics;  Laterality: Right;     OB History   No obstetric history on file.      Home Medications    Prior to Admission medications   Medication Sig Start Date End Date Taking? Authorizing Provider  clopidogrel (PLAVIX) 75 MG tablet Take 1 tablet (75 mg total) by mouth daily with breakfast. 01/21/18   Ulyses Amor, PA-C  cycloSPORINE (RESTASIS) 0.05 % ophthalmic emulsion Place 1 drop into both eyes 2 (two) times daily.    [provider]  dexamethasone (DECADRON) 4 MG tablet Take 2 tabs at the night before and 2 tabs the morning of chemotherapy, every 3 weeks, by mouth Patient taking differently: Take 8 mg by mouth See admin instructions. Take 8 mg the night before chemo and 8 mg the morning of chemotherapy every 3 weeks 06/30/18   [name], MD  lansoprazole (PREVACID) 30 MG capsule Take 1 capsule (30 mg total) by mouth daily at 12 noon. 08/11/18   Tanner, Lyndon Code., PA-C  lidocaine-prilocaine (EMLA) cream Apply to affected area once 06/30/18   [name], MD  morphine (MS CONTIN) 30 MG 12 hr tablet Take 1 tablet (30 mg total) by mouth every 12 (twelve) hours. Patient taking differently: Take 30 mg by mouth every 12 (twelve) hours as needed for pain.  07/07/18   [name], MD  morphine (MSIR) 15 MG tablet Take 1 tablet (15 mg total) by mouth every 6 (six) hours as needed for severe pain. 07/07/18   [name], MD  mupirocin ointment (BACTROBAN) 2 % Apply to left lower extremity wounds 3 times daily 08/11/18   Sandi Mealy E., PA-C  Olopatadine HCl 0.2 % SOLN Place 1 drop into both eyes daily. 12/06/17   [provider]  ondansetron (ZOFRAN ODT) 4 MG disintegrating tablet 71m ODT q4 hours prn nausea/vomit 07/19/18   FDeno Etienne DO  ondansetron (ZOFRAN) 8 MG tablet Take 1 tablet (8 mg total) by mouth every 8 (eight) hours as needed. Patient taking differently: Take 8 mg by mouth every 8 (eight) hours as needed for nausea or vomiting.   06/30/18   GHeath [name] MD  polyethylene glycol (MIRALAX / GLYCOLAX) 17 g packet Take 17 g by mouth daily. 06/24/18   GHeath [name] MD  potassium chloride SA (K-DUR) 20 MEQ tablet Take 1 tablet (20 mEq total) by mouth daily. 07/15/18   Tanner, VLyndon Code, PA-C  prochlorperazine (COMPAZINE) 10 MG tablet Take 1 tablet (10 mg total) by mouth every 6 (six) hours as needed (Nausea or vomiting). Patient taking differently: Take 10 mg by mouth every 6 (six) hours as needed for nausea or vomiting.  06/30/18   GHeath [name] MD  rivaroxaban (XARELTO) 20 MG TABS tablet Take 1 tablet (20 mg total) by mouth daily with breakfast. 08/24/18   GHeath [name] MD  traMADol (Veatrice Bourbon 50 MG tablet  06/18/18   [provider]    Family History Family History  Problem Relation  Age of Onset  . Arthritis Mother   . Hypertension Mother   . Alzheimer's disease Father   . Diabetes Sister   . Hypertension Sister   . Hypertension Brother   . Stroke Brother   . Hypertension Brother   . Hypertension Sister   . Hypertension Sister   . Hypertension Sister   . Breast cancer Other        Niece  . Breast cancer Niece 64       sister's daughter  . Anesthesia problems Neg Hx   . Hypotension Neg Hx   . Malignant hyperthermia Neg Hx   . Pseudochol deficiency Neg Hx   . Colon cancer Neg Hx     Social History Social History   Tobacco Use  . Smoking status: Former Smoker    Years: 1.00    Quit date: 04/22/1988    Years since quitting: 30.3  . Smokeless tobacco: Never Used  Substance Use Topics  . Alcohol use: No  . Drug use: No     Allergies   Patient has no known allergies.   Review of Systems Review of Systems  All other systems reviewed and are negative.    Physical Exam Updated Vital Signs BP 127/60   Pulse 79   Temp 98.6 F (37 C) (Oral)   Resp 18   Ht _0  (1.549 m)   Wt 99.8 kg   SpO2 98%   BMI 41.57 kg/m   Physical Exam Vitals signs and nursing note reviewed.  Constitutional:       General: She is not in acute distress.    Appearance: She is well-developed. She is not ill-appearing, toxic-appearing or diaphoretic.  HENT:     Head: Normocephalic and atraumatic.     Right Ear: External ear normal.     Left Ear: External ear normal.  Eyes:     Conjunctiva/sclera: Conjunctivae normal.     Pupils: Pupils are equal, round, and reactive to light.  Neck:     Musculoskeletal: Normal range of motion and neck supple.     Trachea: Phonation normal.  Cardiovascular:     Rate and Rhythm: Normal rate.  Pulmonary:     Effort: Pulmonary effort is normal.  Musculoskeletal: Normal range of motion.     Comments: Left leg, very large, much larger than right.  Brawny edema present in bilateral lower legs.  2 areas  of wound drainage, anterior lower pretibial space.  Also 1 posteriorly, at the same level.  The drainage appears to be clear, edema fluid.  There is no associated fluctuance, or proximal streaking.  See representative photo.  Skin:    General: Skin is warm and dry.  Neurological:     Mental Status: She is alert and oriented to person, place, and time.     Cranial Nerves: No cranial nerve deficit.     Sensory: No sensory deficit.     Motor: No abnormal muscle tone.     Coordination: Coordination normal.  Psychiatric:        Mood and Affect: Mood normal.        Behavior: Behavior normal.        Thought Content: Thought content normal.        Judgment: Judgment normal.     Left anteromedial lower leg, depicted in the image above.   ED Treatments / Results  Labs (all labs ordered are listed, but only abnormal results are displayed) Labs Reviewed  COMPREHENSIVE METABOLIC PANEL - Abnormal; Notable  for the following components:      Result Value   Potassium 3.3 (*)    Glucose, Bld 102 (*)    All other components within normal limits  CBC WITH DIFFERENTIAL/PLATELET - Abnormal; Notable for the following components:   WBC 2.6 (*)    RBC 3.01 (*)    Hemoglobin 8.3  (*)    HCT 27.4 (*)    RDW 18.7 (*)    Neutro Abs 1.2 (*)    Abs Immature Granulocytes 0.13 (*)    All other components within normal limits  SARS CORONAVIRUS 2 (HOSPITAL ORDER, [facility])    EKG None  Radiology Vas [place] Lower Extremity Venous (dvt)  Result Date: 09/07/2018  Lower Venous Study Indications: Edema. Other Indications: Hx iliac DVT and iliac stent placement. Limitations: Body habitus and poor ultrasound/tissue interface. Comparison Study: 04/12/18 negative Performing Technologist: June Leap RDMS, RVT  Examination Guidelines: A complete evaluation includes B-mode imaging, spectral Doppler, color Doppler, and power Doppler as needed of all accessible portions of each vessel. Bilateral testing is considered an integral part of a complete examination. Limited examinations for reoccurring indications may be performed as noted.  +---------+---------------+---------+-----------+----------+--------------+ LEFT     CompressibilityPhasicitySpontaneityPropertiesSummary        +---------+---------------+---------+-----------+----------+--------------+ CFV      Partial        Yes      Yes                                 +---------+---------------+---------+-----------+----------+--------------+ SFJ      Partial        Yes      Yes                                 +---------+---------------+---------+-----------+----------+--------------+ FV Prox  Partial        Yes      Yes                                 +---------+---------------+---------+-----------+----------+--------------+ FV Mid   Full                                                        +---------+---------------+---------+-----------+----------+--------------+ FV DistalFull                                                        +---------+---------------+---------+-----------+----------+--------------+ POP                     Yes      Yes                                  +---------+---------------+---------+-----------+----------+--------------+ PTV  Not visualized +---------+---------------+---------+-----------+----------+--------------+ PERO                                                  Not visualized +---------+---------------+---------+-----------+----------+--------------+   Left Technical Findings: Technically limited study. Unable to visualize more proximally into iliac veins.   Summary: Left: Findings consistent with age indeterminate deep vein thrombosis involving the left common femoral vein, and left proximal femoral vein.  *See table(s) above for measurements and observations.    Preliminary     Procedures Procedures (including critical care time)  Medications Ordered in ED Medications - No data to display   Initial Impression / Assessment and Plan / ED Course  I have reviewed the triage vital signs and the nursing notes.  Pertinent labs & imaging results that were available during my care of the patient were reviewed by me and considered in my medical decision making (see chart for details).  Clinical Course as of Sep 07 1906  Mon Sep 07, 2018  6283 Abnormal, white count, hemoglobin low  CBC with Differential(!) [EW]  1842 Abnormal, potassium low, glucose  Comprehensive metabolic panel(!) [EW]  6629 Abnormal, femoral vein DVT, acute, left  VAS [place] LOWER EXTREMITY VENOUS (DVT) [EW]  1905 I discussed case with vascular surgeon, Dr. Donzetta Matters.  He will see her as a Optometrist and requests that she be admitted at [facility].   [EW]    Clinical Course User Index [EW] Daleen Bo, MD        Patient Vitals for the past 24 hrs:  BP Temp Temp src Pulse Resp SpO2 Height Weight  09/07/18 1810 127/60 - - 79 18 98 % - -  09/07/18 1700 (!) 129/105 - - 87 18 100 % - -  09/07/18 1156 - - - - - - _0  (1.549 m) 99.8 kg  09/07/18 1155 106/90 98.6 F (37 C) Oral 99 14 98 % - -     7:06 PM Reevaluation with update and discussion. After initial assessment and treatment, an updated evaluation reveals no change in clinical status.  Findings discussed with the patient and all questions were answered.  I have also discussed the situation with both her son and her sister.  This communication was by telephone. Daleen Bo   Medical Decision Making: Recurrent left leg DVT, now causing decreased blood flow leading to poorly healing wounds of left lower leg, which are leaking tissue edema.  His wounds do not appear infected.  The patient is nontoxic.  Doubt sepsis, metabolic instability or impending vascular collapse.  The patient presents taking Xarelto, therefore has a failed response with recurrent DVT.  She is being transitioned to heparin pending possible surgical thrombectomy.  She will require admission for management.  CRITICAL CARE-yes Performed by: Daleen Bo  Nursing Notes Reviewed/ Care Coordinated Applicable Imaging Reviewed Interpretation of Laboratory Data incorporated into ED treatment  7:08 PM-Consult complete with hospitalist. Patient case explained and discussed.  He agrees to admit patient for further evaluation and treatment. Call ended at 7:20 PM  Plan: Admit  Final Clinical Impressions(s) / ED Diagnoses   Final diagnoses:  Acute deep vein thrombosis (DVT) of femoral vein of left lower extremity ([place])  Multiple open wounds of lower leg, left, sequela    ED Discharge Orders    None       Daleen Bo, MD 09/07/18  [place]

## 2018-09-07 NOTE — ED Triage Notes (Signed)
Pt states she has had left leg swelling for a while. Pt states she is having constipation as well.  Pt has finished abx and her leg has remained swollen. Last chemo last month.

## 2018-09-07 NOTE — Telephone Encounter (Signed)
Spoke to [employer]- stressed urgent need to be evaluated. He is on his way to pick her up and bring her the ED at [facility] right now.

## 2018-09-07 NOTE — Progress Notes (Addendum)
ANTICOAGULATION CONSULT NOTE - Initial Consult  Pharmacy Consult for IV Heparin Indication: DVT  No Known Allergies  Patient Measurements: Height: 5\' 1"  (154.9 cm) Weight: 220 lb (99.8 kg) IBW/kg (Calculated) : 47.8 Heparin Dosing Weight: 71.8 kg  Vital Signs: Temp: 98.6 F (37 C) (07/20 1155) Temp Source: Oral (07/20 1155) BP: 127/60 (07/20 1810) Pulse Rate: 79 (07/20 1810)  Labs: Recent Labs    09/07/18 1654  HGB 8.3*  HCT 27.4*  PLT 242  CREATININE 0.64    Estimated Creatinine Clearance: 67.8 mL/min (by C-G formula based on SCr of 0.64 mg/dL).   Medical History: Past Medical History:  Diagnosis Date  . Acute upper respiratory infection 07/06/2014  . Anemia   . Arthritis    Back   . Colon polyp    Tubular Adenoma   . Cough productive of clear sputum 06/22/2014  . Family history of breast cancer   . GERD (gastroesophageal reflux disease)   . History of right bundle branch block (RBBB)   . HOH (hard of hearing)   . Hypertension    had in the past, is no longer on medication for this and blood pressures are WNL  . 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 Primary localized osteoarthritis of right knee   . Uterine cancer ([place]) 06/23/2018    Medications:  Scheduled:  Infusions:   Assessment: 73 yo female with new DVT to start IV heparin per pharmacy dosing. Baseline CBC and renal function labs already drawn, but will need baseline aPTT and heparin level. Note that patient was already on Xarelto 20mg  once daily prior to admission for hx DVT with last dose this AM 7/20 at 0900 - As a result, patient will not need to start IV heparin until 0900 tomorrow AM 7/21  Goal of Therapy:  Heparin level 0.3-0.7 units/ml Monitor platelets by anticoagulation protocol: Yes   Plan:  1) NO IV Heparin bolus 2) Start IV heparin tomorrow 7/21 at 0900 at a rate of 1200 units/hr 3) Check heparin level and aPTT now 4) Will check heparin level and aPTT 6 hours after start of IV heparin tomorrow  7/21. Note will check heparin level and aPTT until both levels correlate as Xarelto will falsely elevate heparin level 2) Daily CBC, heparin level, aPTT   Adrian Saran, PharmD, BCPS 09/07/2018 7:35 PM

## 2018-09-07 NOTE — ED Notes (Signed)
Pt's purse was given to her son by registration staff. I provided with "[employer]" meal and sprite. RN at bedside attempting IV.

## 2018-09-07 NOTE — ED Notes (Signed)
ED TO INPATIENT HANDOFF REPORT  Name/Age/Gender [name] 73 y.o. female  Code Status Code Status History    Date Active Date Inactive Code Status Order ID Comments User Context   01/18/2018 2237 01/21/2018 1558 Full Code [id number]  Waynetta Sandy, MD Inpatient   07/04/2014 1655 07/06/2014 1552 Full Code [id number]  Nilda Riggs Inpatient   07/04/2014 1245 07/04/2014 1655 Full Code [id number]  Nilda Riggs Inpatient   04/29/2011 1331 05/01/2011 1927 Full Code [id number]  Florian Buff, RN Inpatient   Advance Care Planning Activity      Home/SNF/Other Home  Chief Complaint Ca pt, leg swelling with drainage  Level of Care/Admitting Diagnosis ED Disposition    ED Disposition Condition [place]: [place] [100100]  Level of Care: Telemetry Medical [104]  I expect the patient will be discharged within 24 hours: No (not a candidate for 5C-Observation unit)  Covid Evaluation: Asymptomatic Screening Protocol (No Symptoms)  Diagnosis: Left leg DVT [facility]) [517001]  Admitting Physician: Rise Patience [phone]  Attending Physician: Rise Patience [email or web address]  PT Class (Do Not Modify): Observation [104]  PT Acc Code (Do Not Modify): Observation [10022]       Medical History Past Medical History:  Diagnosis Date  . Acute upper respiratory infection 07/06/2014  . Anemia   . Arthritis    Back   . Colon polyp    Tubular Adenoma   . Cough productive of clear sputum 06/22/2014  . Family history of breast cancer   . GERD (gastroesophageal reflux disease)   . History of right bundle branch block (RBBB)   . HOH (hard of hearing)   . Hypertension    had in the past, is no longer on medication for this and blood pressures are WNL  . 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 Primary localized osteoarthritis of right knee   . Uterine cancer ([place]) 06/23/2018    Allergies No Known Allergies  IV Location/Drains/Wounds Patient  Lines/Drains/Airways Status   Active Line/Drains/Airways    Name:   Placement date:   Placement time:   Site:   Days:   Implanted Port 06/29/18 Right Chest   06/29/18    1528    Chest   70   Peripheral IV 09/07/18 Right Antecubital   09/07/18    2058    Antecubital   less than 1   Incision (Closed) 06/18/18 Groin Left   06/18/18    0814     81          Labs/Imaging Results for orders placed or performed during the hospital encounter of 09/07/18 (from the past 48 hour(s))  Comprehensive metabolic panel     Status: Abnormal   Collection Time: 09/07/18  4:54 PM  Result Value Ref Range   Sodium 136 135 - 145 mmol/L   Potassium 3.3 (L) 3.5 - 5.1 mmol/L   Chloride 101 98 - 111 mmol/L   CO2 22 22 - 32 mmol/L   Glucose, Bld 102 (H) 70 - 99 mg/dL   BUN 10 8 - 23 mg/dL   Creatinine, Ser 0.64 0.44 - 1.00 mg/dL   Calcium 9.5 8.9 - 10.3 mg/dL   Total Protein 8.1 6.5 - 8.1 g/dL   Albumin 3.6 3.5 - 5.0 g/dL   AST 19 15 - 41 U/L   ALT 18 0 - 44 U/L   Alkaline Phosphatase 73 38 - 126 U/L   Total Bilirubin 0.7 0.3 -  1.2 mg/dL   GFR calc non Af Amer >60 >60 mL/min   GFR calc Af Amer >60 >60 mL/min   Anion gap 13 5 - 15    Comment: Performed at [facility], [place] [address] [place], [place] [address]  CBC with Differential     Status: Abnormal   Collection Time: 09/07/18  4:54 PM  Result Value Ref Range   WBC 2.6 (L) 4.0 - 10.5 K/uL   RBC 3.01 (L) 3.87 - 5.11 MIL/uL   Hemoglobin 8.3 (L) 12.0 - 15.0 g/dL   HCT 27.4 (L) 36.0 - 46.0 %   MCV 91.0 80.0 - 100.0 fL   MCH 27.6 26.0 - 34.0 pg   MCHC 30.3 30.0 - 36.0 g/dL   RDW 18.7 (H) 11.5 - 15.5 %   Platelets 242 150 - 400 K/uL   nRBC 0.0 0.0 - 0.2 %   Neutrophils Relative % 47 %   Neutro Abs 1.2 (L) 1.7 - 7.7 K/uL   Lymphocytes Relative 29 %   Lymphs Abs 0.7 0.7 - 4.0 K/uL   Monocytes Relative 18 %   Monocytes Absolute 0.5 0.1 - 1.0 K/uL   Eosinophils Relative 0 %   Eosinophils Absolute 0.0 0.0 - 0.5 K/uL   Basophils  Relative 1 %   Basophils Absolute 0.0 0.0 - 0.1 K/uL   Immature Granulocytes 5 %   Abs Immature Granulocytes 0.13 (H) 0.00 - 0.07 K/uL    Comment: Performed at [facility], [place] [address], [place], [place] [address]  APTT     Status: Abnormal   Collection Time: 09/07/18  4:54 PM  Result Value Ref Range   aPTT 43 (H) 24 - 36 seconds    Comment:        IF BASELINE aPTT IS ELEVATED, SUGGEST PATIENT RISK ASSESSMENT BE USED TO DETERMINE APPROPRIATE ANTICOAGULANT THERAPY. Performed at [facility], [place] [address], [place], [place] [address]   SARS Coronavirus 2 (CEPHEID - Performed in [place] hospital lab), Hosp Order     Status: None   Collection Time: 09/07/18  6:42 PM   Specimen: Nasopharyngeal Swab  Result Value Ref Range   SARS Coronavirus 2 NEGATIVE NEGATIVE    Comment: (NOTE) If result is NEGATIVE SARS-CoV-2 target nucleic acids are NOT DETECTED. The SARS-CoV-2 RNA is generally detectable in upper and lower  respiratory specimens during the acute phase of infection. The lowest  concentration of SARS-CoV-2 viral copies this assay can detect is 250  copies / mL. A negative result does not preclude SARS-CoV-2 infection  and should not be used as the sole basis for treatment or other  patient management decisions.  A negative result may occur with  improper specimen collection / handling, submission of specimen other  than nasopharyngeal swab, presence of viral mutation(s) within the  areas targeted by this assay, and inadequate number of viral copies  (<250 copies / mL). A negative result must be combined with clinical  observations, patient history, and epidemiological information. If result is POSITIVE SARS-CoV-2 target nucleic acids are DETECTED. The SARS-CoV-2 RNA is generally detectable in upper and lower  respiratory specimens dur ing the acute phase of infection.  Positive  results are indicative of active infection with  SARS-CoV-2.  Clinical  correlation with patient history and other diagnostic information is  necessary to determine patient infection status.  Positive results do  not rule out bacterial infection or co-infection with other viruses. If result is PRESUMPTIVE  POSTIVE SARS-CoV-2 nucleic acids MAY BE PRESENT.   A presumptive positive result was obtained on the submitted specimen  and confirmed on repeat testing.  While 2019 novel coronavirus  (SARS-CoV-2) nucleic acids may be present in the submitted sample  additional confirmatory testing may be necessary for epidemiological  and / or clinical management purposes  to differentiate between  SARS-CoV-2 and other Sarbecovirus currently known to infect humans.  If clinically indicated additional testing with an alternate test  methodology [phone]) is advised. The SARS-CoV-2 RNA is generally  detectable in upper and lower respiratory sp ecimens during the acute  phase of infection. The expected result is Negative. Fact Sheet for Patients:  StrictlyIdeas.no Fact Sheet for Healthcare Providers: BankingDealers.co.za This test is not yet approved or cleared by the [place] FDA and has been authorized for detection and/or diagnosis of SARS-CoV-2 by FDA under an Emergency Use Authorization (EUA).  This EUA will remain in effect (meaning this test can be used) for the duration of the COVID-19 declaration under Section 564(b)(1) of the Act, 21 U.S.C. section 360bbb-3(b)(1), unless the authorization is terminated or revoked sooner. Performed at [facility], [place] [address] [place], [place] [address]   Heparin level (unfractionated)     Status: Abnormal   Collection Time: 09/07/18  7:47 PM  Result Value Ref Range   Heparin Unfractionated >2.20 (H) 0.30 - 0.70 IU/mL    Comment: RESULTS CONFIRMED BY MANUAL DILUTION Performed at [place] [address], [place], [place] [address]    Vas [place] Lower Extremity Venous (dvt)  Result Date: 09/07/2018  Lower Venous Study Indications: Edema. Other Indications: Hx iliac DVT and iliac stent placement. Limitations: Body habitus and poor ultrasound/tissue interface. Comparison Study: 04/12/18 negative Performing Technologist: June Leap RDMS, RVT  Examination Guidelines: A complete evaluation includes B-mode imaging, spectral Doppler, color Doppler, and power Doppler as needed of all accessible portions of each vessel. Bilateral testing is considered an integral part of a complete examination. Limited examinations for reoccurring indications may be performed as noted.  +---------+---------------+---------+-----------+----------+--------------+ LEFT     CompressibilityPhasicitySpontaneityPropertiesSummary        +---------+---------------+---------+-----------+----------+--------------+ CFV      Partial        Yes      Yes                                 +---------+---------------+---------+-----------+----------+--------------+ SFJ      Partial        Yes      Yes                                 +---------+---------------+---------+-----------+----------+--------------+ FV Prox  Partial        Yes      Yes                                 +---------+---------------+---------+-----------+----------+--------------+ FV Mid   Full                                                        +---------+---------------+---------+-----------+----------+--------------+ FV DistalFull                                                        +---------+---------------+---------+-----------+----------+--------------+  POP                     Yes      Yes                                 +---------+---------------+---------+-----------+----------+--------------+ PTV                                                   Not visualized  +---------+---------------+---------+-----------+----------+--------------+ PERO                                                  Not visualized +---------+---------------+---------+-----------+----------+--------------+   Left Technical Findings: Technically limited study. Unable to visualize more proximally into iliac veins.   Summary: Left: Findings consistent with age indeterminate deep vein thrombosis involving the left common femoral vein, and left proximal femoral vein.  *See table(s) above for measurements and observations. Electronically signed by Servando Snare MD on 09/07/2018 at 9:41:52 PM.    Final     Pending Labs Unresulted Labs (From admission, onward)    Start     Ordered   09/08/18 1500  Heparin level (unfractionated)  Once-Timed,   R     09/07/18 1949   09/08/18 1500  APTT  Once-Timed,   R     09/07/18 1949   Signed and Held  Basic metabolic panel  Tomorrow morning,   R     Signed and Held   Signed and Held  CBC  Tomorrow morning,   R     Signed and Held          Vitals/Pain Today's Vitals   09/07/18 1155 09/07/18 1156 09/07/18 1700 09/07/18 1810  BP: 106/90  (!) 129/105 127/60  Pulse: 99  87 79  Resp: 14  18 18   Temp: 98.6 F (37 C)     TempSrc: Oral     SpO2: 98%  100% 98%  Weight:  99.8 kg    Height:  5\' 1"  (1.549 m)    PainSc:  8       Isolation Precautions No active isolations  Medications Medications  heparin ADULT infusion 100 units/mL (25000 units/21mL sodium chloride 0.45%) (has no administration in time range)    Mobility walks

## 2018-09-07 NOTE — ED Notes (Signed)
Assisted pt with ambulating to and from RR using her walker.

## 2018-09-07 NOTE — Telephone Encounter (Signed)
"  [name] son [name] [phone]).    Her left leg is swollen from the calf to the foot.  The foot is a darker color black and numb.    Two sores on her leg the size of half dollars hurt and draining pus in two areas.    Antibiotic did not clear this up, her leg looks worse or infected.   No fever.  I can bring her in or is there a way to send a picture."  Notifying provider.  Declined MyChart activation with this call.

## 2018-09-07 NOTE — Consult Note (Signed)
Hospital Consult    Reason for Consult: Edema left leg Referring Physician: Dr. Eulis Foster, ED MRN #:  [id number]  History of Present Illness: This is a 73 y.o. female has previously undergone left common external leg vein stenting by me.  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  She is subsequently undergone radiation and is currently in the process of chemotherapy.  States that she does attempt to wear her compression stockings that are knee-high.  States that she has been taking Xarelto.  She did develop a wound in the left leg which has been weeping she presented emergency department for worsening pain today.  She is planned to have a CT scan in the near future to evaluate her cancer progression.  Past Medical History:  Diagnosis Date  . Acute upper respiratory infection 07/06/2014  . Anemia   . Arthritis    Back   . Colon polyp    Tubular Adenoma   . Cough productive of clear sputum 06/22/2014  . Family history of breast cancer   . GERD (gastroesophageal reflux disease)   . History of right bundle branch block (RBBB)   . HOH (hard of hearing)   . Hypertension    had in the past, is no longer on medication for this and blood pressures are WNL  . 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 Primary localized osteoarthritis of right knee   . Uterine cancer ([place]) 06/23/2018    Past Surgical History:  Procedure Laterality Date  . ABDOMINAL HYSTERECTOMY  2012  . CHOLECYSTECTOMY N/A 03/09/2013   Procedure: LAPAROSCOPIC CHOLECYSTECTOMY;  Surgeon: Gayland Curry, MD;  Location: [facility];  Service: General;  Laterality: N/A;  . COLONOSCOPY W/ BIOPSIES    . IR IMAGING GUIDED PORT INSERTION  06/29/2018  . LARYNGOSCOPY Left 03/14/2017   Procedure: LARYNGOSCOPY;  Surgeon: Helayne Seminole, MD;  Location: [facility];  Service: ENT;  Laterality: Left;  . LOWER EXTREMITY VENOGRAPHY Left 01/19/2018   Procedure: LOWER EXTREMITY VENOGRAPHY;   Surgeon: Waynetta Sandy, MD;  Location: [facility];  Service: Cardiovascular;  Laterality: Left;  . LYMPH NODE BIOPSY Left 06/18/2018   Procedure: EXCISIONAL BIOPSY LEFT INGUINAL LYMPH NODE;  Surgeon: Coralie Keens, MD;  Location: [facility];  Service: General;  Laterality: Left;  . PERIPHERAL VASCULAR INTERVENTION Left 01/19/2018   Procedure: PERIPHERAL VASCULAR INTERVENTION;  Surgeon: Waynetta Sandy, MD;  Location: [facility];  Service: Cardiovascular;  Laterality: Left;  LEFT ILIAC VENOUS  . TOTAL KNEE ARTHROPLASTY  04/29/2011   Procedure: TOTAL KNEE ARTHROPLASTY;  Surgeon: Lorn Junes, MD;  Location: [facility];  Service: Orthopedics;  Laterality: Left;  [facility] [place] THIS CASE  . TOTAL KNEE ARTHROPLASTY Right 07/04/2014   Procedure: TOTAL KNEE ARTHROPLASTY;  Surgeon: Elsie Saas, MD;  Location: [facility];  Service: Orthopedics;  Laterality: Right;    No Known Allergies  Prior to Admission medications   Medication Sig Start Date End Date Taking? Authorizing Provider  cycloSPORINE (RESTASIS) 0.05 % ophthalmic emulsion Place 1 drop into both eyes 2 (two) times daily.   Yes [provider]  morphine (MS CONTIN) 30 MG 12 hr tablet Take 1 tablet (30 mg total) by mouth every 12 (twelve) hours. Patient taking differently: Take 30 mg by mouth every 12 (twelve) hours as needed for pain.  07/07/18  Yes [name], MD  Olopatadine HCl  0.2 % SOLN Place 1 drop into both eyes daily. 12/06/17  Yes [provider]  potassium chloride SA (K-DUR) 20 MEQ tablet Take 1 tablet (20 mEq total) by mouth daily. 07/15/18  Yes Tanner, Lyndon Code., PA-C  rivaroxaban (XARELTO) 20 MG TABS tablet Take 1 tablet (20 mg total) by mouth daily with breakfast. 08/24/18  Yes [name], MD  clopidogrel (PLAVIX) 75 MG tablet Take 1 tablet (75 mg total) by mouth daily with breakfast. Patient not taking: Reported on 09/07/2018 01/21/18   Ulyses Amor, PA-C  dexamethasone  (DECADRON) 4 MG tablet Take 2 tabs at the night before and 2 tabs the morning of chemotherapy, every 3 weeks, by mouth Patient taking differently: Take 8 mg by mouth See admin instructions. Take 8 mg the night before chemo and 8 mg the morning of chemotherapy every 3 weeks 06/30/18   [name], MD  lansoprazole (PREVACID) 30 MG capsule Take 1 capsule (30 mg total) by mouth daily at 12 noon. Patient not taking: Reported on 09/07/2018 08/11/18   Harle Stanford., PA-C  lidocaine-prilocaine (EMLA) cream Apply to affected area once Patient not taking: Reported on 09/07/2018 06/30/18   [name], MD  morphine (MSIR) 15 MG tablet Take 1 tablet (15 mg total) by mouth every 6 (six) hours as needed for severe pain. 07/07/18   [name], MD  mupirocin ointment (BACTROBAN) 2 % Apply to left lower extremity wounds 3 times daily Patient not taking: Reported on 09/07/2018 08/11/18   Harle Stanford., PA-C  ondansetron (ZOFRAN ODT) 4 MG disintegrating tablet 4mg  ODT q4 hours prn nausea/vomit Patient not taking: Reported on 09/07/2018 07/19/18   Deno Etienne, DO  ondansetron (ZOFRAN) 8 MG tablet Take 1 tablet (8 mg total) by mouth every 8 (eight) hours as needed. Patient not taking: Reported on 09/07/2018 06/30/18   [name], MD  polyethylene glycol (MIRALAX / GLYCOLAX) 17 g packet Take 17 g by mouth daily. Patient not taking: Reported on 09/07/2018 06/24/18   [name], MD  prochlorperazine (COMPAZINE) 10 MG tablet Take 1 tablet (10 mg total) by mouth every 6 (six) hours as needed (Nausea or vomiting). Patient not taking: Reported on 09/07/2018 06/30/18   [name], MD    Social History   Socioeconomic History  . Marital status: Divorced    Spouse name: Not on file  . Number of children: 1  . Years of education: Not on file  . Highest education level: Not on file  Occupational History  . Occupation: Retired   Scientific laboratory technician  . Financial resource strain: Not on file  . Food insecurity    Worry: Not on file     Inability: Not on file  . Transportation needs    Medical: Not on file    Non-medical: Not on file  Tobacco Use  . Smoking status: Former Smoker    Years: 1.00    Quit date: 04/22/1988    Years since quitting: 30.3  . Smokeless tobacco: Never Used  Substance and Sexual Activity  . Alcohol use: No  . Drug use: No  . Sexual activity: Yes    Birth control/protection: Surgical  Lifestyle  . Physical activity    Days per week: Not on file    Minutes per session: Not on file  . Stress: Not on file  Relationships  . Social Herbalist on phone: Not on file    Gets together: Not on file    Attends religious  service: Not on file    Active member of club or organization: Not on file    Attends meetings of clubs or organizations: Not on file    Relationship status: Not on file  . Intimate partner violence    Fear of current or ex partner: Not on file    Emotionally abused: Not on file    Physically abused: Not on file    Forced sexual activity: Not on file  Other Topics Concern  . Not on file  Social History Narrative   Daily caffeine     Family History  Problem Relation Age of Onset  . Arthritis Mother   . Hypertension Mother   . Alzheimer's disease Father   . Diabetes Sister   . Hypertension Sister   . Hypertension Brother   . Stroke Brother   . Hypertension Brother   . Hypertension Sister   . Hypertension Sister   . Hypertension Sister   . Breast cancer Other        Niece  . Breast cancer Niece 52       sister's daughter  . Anesthesia problems Neg Hx   . Hypotension Neg Hx   . Malignant hyperthermia Neg Hx   . Pseudochol deficiency Neg Hx   . Colon cancer Neg Hx     ROS:  Cardiovascular: []  chest pain/pressure []  palpitations []  SOB lying flat []  DOE []  pain in legs while walking [xx] pain in legs at rest []  pain in legs at night []  non-healing ulcers [x]  hx of DVT [x]  swelling in legs  Pulmonary: []  productive cough []  asthma/wheezing []   home O2  Neurologic: []  weakness in []  arms []  legs []  numbness in []  arms []  legs []  hx of CVA []  mini stroke [] difficulty speaking or slurred speech []  temporary loss of vision in one eye []  dizziness  Hematologic: []  hx of cancer []  bleeding problems []  problems with blood clotting easily  Endocrine:   []  diabetes []  thyroid disease  GI []  vomiting blood []  blood in stool  GU: []  CKD/renal failure []  HD--[]  M/W/F or []  T/T/S []  burning with urination []  blood in urine  Psychiatric: []  anxiety []  depression  Musculoskeletal: []  arthritis []  joint pain  Integumentary: []  rashes [x]  ulcers  Constitutional: []  fever []  chills   Physical Examination  Vitals:   09/07/18 1700 09/07/18 1810  BP: (!) 129/105 127/60  Pulse: 87 79  Resp: 18 18  Temp:    SpO2: 100% 98%   Body mass index is 41.57 kg/m.  General: No acute distress HENT: WNL, normocephalic Pulmonary: normal non-labored breathing Cardiac: Palpable radial pulses bilaterally, she is tachycardic Abdomen:  soft, NT/ND, no masses Extremities: I could not palpate a left groin mass.  She has persistent edema of her left lower extremity with ulceration on the medial ankle. Neurologic: A&O X 3; Appropriate Affect ; SENSATION: normal; MOTOR FUNCTION:  moving all extremities equally. Speech is fluent/normal   CBC    Component Value Date/Time   WBC 2.6 (L) 09/07/2018 1654   RBC 3.01 (L) 09/07/2018 1654   HGB 8.3 (L) 09/07/2018 1654   HGB 8.1 (L) 08/24/2018 0927   HCT 27.4 (L) 09/07/2018 1654   PLT 242 09/07/2018 1654   PLT 216 08/24/2018 0927   MCV 91.0 09/07/2018 1654   MCH 27.6 09/07/2018 1654   MCHC 30.3 09/07/2018 1654   RDW 18.7 (H) 09/07/2018 1654   LYMPHSABS 0.7 09/07/2018 1654   MONOABS 0.5 09/07/2018 1654  EOSABS 0.0 09/07/2018 1654   BASOSABS 0.0 09/07/2018 1654    BMET    Component Value Date/Time   NA 136 09/07/2018 1654   K 3.3 (L) 09/07/2018 1654   CL 101 09/07/2018 1654    CO2 22 09/07/2018 1654   GLUCOSE 102 (H) 09/07/2018 1654   BUN 10 09/07/2018 1654   CREATININE 0.64 09/07/2018 1654   CREATININE 0.72 08/24/2018 0927   CALCIUM 9.5 09/07/2018 1654   GFRNONAA >60 09/07/2018 1654   GFRNONAA >60 08/24/2018 0927   GFRAA >60 09/07/2018 1654   GFRAA >60 08/24/2018 0927    COAGS: Lab Results  Component Value Date   INR 1.3 (H) 06/29/2018   INR 1.4 (H) 06/18/2018   INR 1.4 (H) 06/17/2018     Non-Invasive Vascular Imaging:   Duplex I independently interpreted her left lower extremity duplex with the below noted findings. Left Technical Findings: Technically limited study. Unable to visualize more proximally into iliac veins.    Summary: Left: Findings consistent with age indeterminate deep vein thrombosis involving the left common femoral vein, and left proximal femoral vein.     ASSESSMENT/PLAN: This is a 73 y.o. female status post stenting of her left common external leg veins for what was thought to be primary venous process was later thought to be secondary to invasive cancer.  She is now undergone radiation and is undergoing chemotherapy.  She does have pancytopenia secondary to treatment of her cancer.  She is also been compliant on her Xarelto.  Is plan for CT imaging of the groin mass.  I think we should proceed with a CT venogram of the abdomen and pelvis which can give [place] more information about the patency of her stents.  Is possible she is occluded her stents or has recurrent DVT although I think less likely and some component of this may be lymphatic in nature.  She does have issue with distensible with the veins distally could consider endovascular intervention during this hospitalization.  Geordan Xu C. Donzetta Matters, MD [facility] Office: [phone] Pager: [phone]

## 2018-09-07 NOTE — ED Notes (Signed)
[facility] dispatch notified for need of transport.  

## 2018-09-07 NOTE — Telephone Encounter (Signed)
If her foot is black and numb we could be dealing with ischemic leg She needs urgent evaluation in the ER and will likely need IV antibiotics

## 2018-09-07 NOTE — H&P (Signed)
History and Physical    Sareen Randon Farnworth YDX:[id number] DOB: June 12, 1945 DOA: 09/07/2018  PCP: Nolene Ebbs, MD  Patient coming from: Home.  Chief Complaint: Leg swelling and weeping from the wound.  HPI: [name] is a 73 y.o. 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 Donzetta Matters and subsequently which was diagnosed with uterine cancer underwent radiation and planned chemotherapy followed by Dr. Rozetta Nunnery, oncologist has been compliant with her Xarelto noticed increasing swelling and wound on the left lower extremity with some weeping over the last few days.  Was recently treated for cellulitis of the same extremity.  Has been a increasing pain.  ED Course: In the ER patient was afebrile labs show  Review of Systems: As per HPI, rest all negative.  WBC 2.6 hemoglobin 8.3 platelets 242 creatinine 1.6.  Dopplers of the lower extremities show age-indeterminate DVT of the left common femoral vein and left proximal femoral vein.  Dr. Donzetta Matters was consulted patient was started on heparin infusion and transferred to [facility] for further management and likely intervention.   Past Medical History:  Diagnosis Date  . Acute upper respiratory infection 07/06/2014  . Anemia   . Arthritis    Back   . Colon polyp    Tubular Adenoma   . Cough productive of clear sputum 06/22/2014  . Family history of breast cancer   . GERD (gastroesophageal reflux disease)   . History of right bundle branch block (RBBB)   . HOH (hard of hearing)   . Hypertension    had in the past, is no longer on medication for this and blood pressures are WNL  . 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 Primary localized osteoarthritis of right knee   . Uterine cancer ([place]) 06/23/2018    Past Surgical History:  Procedure Laterality Date  . ABDOMINAL HYSTERECTOMY  2012  . CHOLECYSTECTOMY N/A 03/09/2013   Procedure: LAPAROSCOPIC CHOLECYSTECTOMY;  Surgeon: Gayland Curry, MD;  Location: [place];   Service: General;  Laterality: N/A;  . COLONOSCOPY W/ BIOPSIES    . IR IMAGING GUIDED PORT INSERTION  06/29/2018  . LARYNGOSCOPY Left 03/14/2017   Procedure: LARYNGOSCOPY;  Surgeon: Helayne Seminole, MD;  Location: [place];  Service: ENT;  Laterality: Left;  . LOWER EXTREMITY VENOGRAPHY Left 01/19/2018   Procedure: LOWER EXTREMITY VENOGRAPHY;  Surgeon: Waynetta Sandy, MD;  Location: [place] [facility];  Service: Cardiovascular;  Laterality: Left;  . LYMPH NODE BIOPSY Left 06/18/2018   Procedure: EXCISIONAL BIOPSY LEFT INGUINAL LYMPH NODE;  Surgeon: Coralie Keens, MD;  Location: [place];  Service: General;  Laterality: Left;  . PERIPHERAL VASCULAR INTERVENTION Left 01/19/2018   Procedure: PERIPHERAL VASCULAR INTERVENTION;  Surgeon: Waynetta Sandy, MD;  Location: [place] [facility];  Service: Cardiovascular;  Laterality: Left;  LEFT ILIAC VENOUS  . TOTAL KNEE ARTHROPLASTY  04/29/2011   Procedure: TOTAL KNEE ARTHROPLASTY;  Surgeon: Lorn Junes, MD;  Location: [place];  Service: Orthopedics;  Laterality: Left;  DR [place] THIS CASE  . TOTAL KNEE ARTHROPLASTY Right 07/04/2014   Procedure: TOTAL KNEE ARTHROPLASTY;  Surgeon: Elsie Saas, MD;  Location: [place];  Service: Orthopedics;  Laterality: Right;     reports that she quit smoking about 30 years ago. She quit after 1.00 year of use. She has never used smokeless tobacco. She reports that she does not drink alcohol or use drugs.  No Known Allergies  Family History  Problem Relation Age of Onset  . Arthritis Mother   . Hypertension Mother   . Alzheimer's disease Father   . Diabetes Sister   . Hypertension Sister   . Hypertension Brother   . Stroke Brother   . Hypertension Brother   . Hypertension Sister   . Hypertension Sister   . Hypertension Sister   . Breast cancer Other        Niece  . Breast cancer Niece 38       sister's daughter  . Anesthesia problems Neg Hx   . Hypotension Neg Hx    . Malignant hyperthermia Neg Hx   . Pseudochol deficiency Neg Hx   . Colon cancer Neg Hx     Prior to Admission medications   Medication Sig Start Date End Date Taking? Authorizing Provider  cycloSPORINE (RESTASIS) 0.05 % ophthalmic emulsion Place 1 drop into both eyes 2 (two) times daily.   Yes [provider]  morphine (MS CONTIN) 30 MG 12 hr tablet Take 1 tablet (30 mg total) by mouth every 12 (twelve) hours. Patient taking differently: Take 30 mg by mouth every 12 (twelve) hours as needed for pain.  07/07/18  Yes Gorsuch, Ni, MD  Olopatadine HCl 0.2 % SOLN Place 1 drop into both eyes daily. 12/06/17  Yes [provider]  potassium chloride SA (K-DUR) 20 MEQ tablet Take 1 tablet (20 mEq total) by mouth daily. 07/15/18  Yes Tanner, Lyndon Code., PA-C  rivaroxaban (XARELTO) 20 MG TABS tablet Take 1 tablet (20 mg total) by mouth daily with breakfast. 08/24/18  Yes [name], MD  clopidogrel (PLAVIX) 75 MG tablet Take 1 tablet (75 mg total) by mouth daily with breakfast. Patient not taking: Reported on 09/07/2018 01/21/18   Ulyses Amor, PA-C  dexamethasone (DECADRON) 4 MG tablet Take 2 tabs at the night before and 2 tabs the morning of chemotherapy, every 3 weeks, by mouth Patient taking differently: Take 8 mg by mouth See admin instructions. Take 8 mg the night before chemo and 8 mg the morning of chemotherapy every 3 weeks 06/30/18   [name], MD  lansoprazole (PREVACID) 30 MG capsule Take 1 capsule (30 mg total) by mouth daily at 12 noon. Patient not taking: Reported on 09/07/2018 08/11/18   Harle Stanford., PA-C  lidocaine-prilocaine (EMLA) cream Apply to affected area once Patient not taking: Reported on 09/07/2018 06/30/18   [name], MD  morphine (MSIR) 15 MG tablet Take 1 tablet (15 mg total) by mouth every 6 (six) hours as needed for severe pain. 07/07/18   [name], MD  mupirocin ointment (BACTROBAN) 2 % Apply to left lower extremity wounds 3 times daily Patient not  taking: Reported on 09/07/2018 08/11/18   Harle Stanford., PA-C  ondansetron (ZOFRAN ODT) 4 MG disintegrating tablet 4mg  ODT q4 hours prn nausea/vomit Patient not taking: Reported on 09/07/2018 07/19/18   Deno Etienne, DO  ondansetron (ZOFRAN) 8 MG tablet Take 1 tablet (8 mg total) by mouth every 8 (eight) hours as needed. Patient not taking: Reported on 09/07/2018 06/30/18   [name], MD  polyethylene glycol (MIRALAX / GLYCOLAX) 17 g packet Take 17 g by mouth daily. Patient not taking: Reported on 09/07/2018 06/24/18   [name], MD  prochlorperazine (COMPAZINE) 10 MG tablet Take 1 tablet (10 mg total) by mouth every 6 (six) hours as needed (Nausea or vomiting). Patient not taking: Reported on 09/07/2018 06/30/18   [name], MD  Physical Exam: Constitutional: Moderately built and nourished. Vitals:   09/07/18 1155 09/07/18 1156 09/07/18 1700 09/07/18 1810  BP: 106/90  (!) 129/105 127/60  Pulse: 99  87 79  Resp: 14  18 18   Temp: 98.6 F (37 C)     TempSrc: Oral     SpO2: 98%  100% 98%  Weight:  99.8 kg    Height:  5\' 1"  (1.549 m)     Eyes: Anicteric no pallor. ENMT: No discharge from the ears eyes nose or mouth. Neck: No mass or.  No neck rigidity. Respiratory: No rhonchi or crepitations. Cardiovascular: S1-S2 heard. Abdomen: Soft nontender bowel sounds present. Musculoskeletal: Swelling of the left lower extremity with some skin excoriation. Skin: Skin excoriation of the left lower extremity. Neurologic: Alert awake oriented to time place and person.  Moves all extremities. Psychiatric: Appears normal per normal affect.   Labs on Admission: I have personally reviewed following labs and imaging studies  CBC: Recent Labs  Lab 09/07/18 1654  WBC 2.6*  NEUTROABS 1.2*  HGB 8.3*  HCT 27.4*  MCV 91.0  PLT 366   Basic Metabolic Panel: Recent Labs  Lab 09/07/18 1654  NA 136  K 3.3*  CL 101  CO2 22  GLUCOSE 102*  BUN 10  CREATININE 0.64  CALCIUM 9.5   GFR: Estimated  Creatinine Clearance: 67.8 mL/min (by C-G formula based on SCr of 0.64 mg/dL). Liver Function Tests: Recent Labs  Lab 09/07/18 1654  AST 19  ALT 18  ALKPHOS 73  BILITOT 0.7  PROT 8.1  ALBUMIN 3.6   No results for input(s): LIPASE, AMYLASE in the last 168 hours. No results for input(s): AMMONIA in the last 168 hours. Coagulation Profile: No results for input(s): INR, PROTIME in the last 168 hours. Cardiac Enzymes: No results for input(s): CKTOTAL, CKMB, CKMBINDEX, TROPONINI in the last 168 hours. BNP (last 3 results) No results for input(s): PROBNP in the last 8760 hours. HbA1C: No results for input(s): HGBA1C in the last 72 hours. CBG: No results for input(s): GLUCAP in the last 168 hours. Lipid Profile: No results for input(s): CHOL, HDL, LDLCALC, TRIG, CHOLHDL, LDLDIRECT in the last 72 hours. Thyroid Function Tests: No results for input(s): TSH, T4TOTAL, FREET4, T3FREE, THYROIDAB in the last 72 hours. Anemia Panel: No results for input(s): VITAMINB12, FOLATE, FERRITIN, TIBC, IRON, RETICCTPCT in the last 72 hours. Urine analysis:    Component Value Date/Time   COLORURINE YELLOW 06/22/2014 1040   APPEARANCEUR CLEAR 06/22/2014 1040   LABSPEC 1.016 06/22/2014 1040   PHURINE 6.5 06/22/2014 1040   GLUCOSEU NEGATIVE 06/22/2014 1040   HGBUR SMALL (A) 06/22/2014 1040   BILIRUBINUR NEGATIVE 06/22/2014 1040   KETONESUR NEGATIVE 06/22/2014 1040   PROTEINUR NEGATIVE 06/22/2014 1040   UROBILINOGEN 0.2 06/22/2014 1040   NITRITE NEGATIVE 06/22/2014 1040   LEUKOCYTESUR NEGATIVE 06/22/2014 1040   Sepsis Labs: @LABRCNTIP (procalcitonin:4,lacticidven:4) )No results found for this or any previous visit (from the past 240 hour(s)).   Radiological Exams on Admission: Vas [place] Lower Extremity Venous (dvt)  Result Date: 09/07/2018  Lower Venous Study Indications: Edema. Other Indications: Hx iliac DVT and iliac stent placement. Limitations: Body habitus and poor ultrasound/tissue  interface. Comparison Study: 04/12/18 negative Performing Technologist: June Leap RDMS, RVT  Examination Guidelines: A complete evaluation includes B-mode imaging, spectral Doppler, color Doppler, and power Doppler as needed of all accessible portions of each vessel. Bilateral testing is considered an integral part of a complete examination. Limited examinations for reoccurring indications may be  performed as noted.  +---------+---------------+---------+-----------+----------+--------------+ LEFT     CompressibilityPhasicitySpontaneityPropertiesSummary        +---------+---------------+---------+-----------+----------+--------------+ CFV      Partial        Yes      Yes                                 +---------+---------------+---------+-----------+----------+--------------+ SFJ      Partial        Yes      Yes                                 +---------+---------------+---------+-----------+----------+--------------+ FV Prox  Partial        Yes      Yes                                 +---------+---------------+---------+-----------+----------+--------------+ FV Mid   Full                                                        +---------+---------------+---------+-----------+----------+--------------+ FV DistalFull                                                        +---------+---------------+---------+-----------+----------+--------------+ POP                     Yes      Yes                                 +---------+---------------+---------+-----------+----------+--------------+ PTV                                                   Not visualized +---------+---------------+---------+-----------+----------+--------------+ PERO                                                  Not visualized +---------+---------------+---------+-----------+----------+--------------+   Left Technical Findings: Technically limited study. Unable to visualize more  proximally into iliac veins.   Summary: Left: Findings consistent with age indeterminate deep vein thrombosis involving the left common femoral vein, and left proximal femoral vein.  *See table(s) above for measurements and observations.    Preliminary       Assessment/Plan Principal Problem:   Left leg DVT (HCC) Active Problems:   Uterine cancer (HCC)   Pancytopenia, acquired ([place])    1. Left leg DVT with worsening swelling and pain and skin excoriation -appreciate Dr. Claretha Cooper consult.  Likely to go CT venogram per vascular surgery.  Patient is on heparin at this time. 2. Uterine cancer being followed by Dr. Rozetta Nunnery oncologist. 3. Pancytopenia likely from treatment for uterine cancer.  Follow CBC.  DVT prophylaxis: Heparin. Code Status: Full code. Family Communication: Discussed with patient. Disposition Plan: Home. Consults called: Vascular surgery. Admission status: Observation.   Rise Patience MD [facility] Pager [phone].  If 7PM-7AM, please contact night-coverage [email or web address] Password [facility]  09/07/2018, 8:33 PM

## 2018-09-07 NOTE — Progress Notes (Signed)
LLE venous duplex       has been completed. Preliminary results can be found under CV proc through chart review. Lashondra Vaquerano, BS, RDMS, RVT    

## 2018-09-08 ENCOUNTER — Observation Stay (HOSPITAL_COMMUNITY): Payer: Medicare Other

## 2018-09-08 ENCOUNTER — Encounter (HOSPITAL_COMMUNITY)
Admission: EM | Disposition: A | Discharge status: Home Health | Payer: Self-pay | Source: Home / Self Care | Attending: Internal Medicine

## 2018-09-08 DIAGNOSIS — C7951 Secondary malignant neoplasm of bone: Secondary | ICD-10-CM | POA: Diagnosis present

## 2018-09-08 DIAGNOSIS — Z6841 Body Mass Index (BMI) 40.0 and over, adult: Secondary | ICD-10-CM | POA: Diagnosis not present

## 2018-09-08 DIAGNOSIS — T82868A Thrombosis of vascular prosthetic devices, implants and grafts, initial encounter: Secondary | ICD-10-CM | POA: Diagnosis present

## 2018-09-08 DIAGNOSIS — R601 Generalized edema: Secondary | ICD-10-CM | POA: Diagnosis not present

## 2018-09-08 DIAGNOSIS — C55 Malignant neoplasm of uterus, part unspecified: Secondary | ICD-10-CM

## 2018-09-08 DIAGNOSIS — I824Y2 Acute embolism and thrombosis of unspecified deep veins of left proximal lower extremity: Secondary | ICD-10-CM

## 2018-09-08 DIAGNOSIS — Z1159 Encounter for screening for other viral diseases: Secondary | ICD-10-CM | POA: Diagnosis not present

## 2018-09-08 DIAGNOSIS — Z7901 Long term (current) use of anticoagulants: Secondary | ICD-10-CM | POA: Diagnosis not present

## 2018-09-08 DIAGNOSIS — Z79891 Long term (current) use of opiate analgesic: Secondary | ICD-10-CM | POA: Diagnosis not present

## 2018-09-08 DIAGNOSIS — Z87891 Personal history of nicotine dependence: Secondary | ICD-10-CM | POA: Diagnosis not present

## 2018-09-08 DIAGNOSIS — H9190 Unspecified hearing loss, unspecified ear: Secondary | ICD-10-CM | POA: Diagnosis present

## 2018-09-08 DIAGNOSIS — D61810 Antineoplastic chemotherapy induced pancytopenia: Secondary | ICD-10-CM | POA: Diagnosis present

## 2018-09-08 DIAGNOSIS — C569 Malignant neoplasm of unspecified ovary: Secondary | ICD-10-CM | POA: Diagnosis present

## 2018-09-08 DIAGNOSIS — Y838 Other surgical procedures as the cause of abnormal reaction of the patient, or of later complication, without mention of misadventure at the time of the procedure: Secondary | ICD-10-CM | POA: Diagnosis present

## 2018-09-08 DIAGNOSIS — E876 Hypokalemia: Secondary | ICD-10-CM | POA: Diagnosis not present

## 2018-09-08 DIAGNOSIS — Z9049 Acquired absence of other specified parts of digestive tract: Secondary | ICD-10-CM | POA: Diagnosis not present

## 2018-09-08 DIAGNOSIS — Z96653 Presence of artificial knee joint, bilateral: Secondary | ICD-10-CM | POA: Diagnosis present

## 2018-09-08 DIAGNOSIS — Z8249 Family history of ischemic heart disease and other diseases of the circulatory system: Secondary | ICD-10-CM | POA: Diagnosis not present

## 2018-09-08 DIAGNOSIS — M8458XD Pathological fracture in neoplastic disease, other specified site, subsequent encounter for fracture with routine healing: Secondary | ICD-10-CM | POA: Diagnosis present

## 2018-09-08 DIAGNOSIS — K219 Gastro-esophageal reflux disease without esophagitis: Secondary | ICD-10-CM | POA: Diagnosis present

## 2018-09-08 DIAGNOSIS — I82412 Acute embolism and thrombosis of left femoral vein: Secondary | ICD-10-CM | POA: Diagnosis present

## 2018-09-08 DIAGNOSIS — Z90710 Acquired absence of both cervix and uterus: Secondary | ICD-10-CM | POA: Diagnosis not present

## 2018-09-08 DIAGNOSIS — C779 Secondary and unspecified malignant neoplasm of lymph node, unspecified: Secondary | ICD-10-CM | POA: Diagnosis present

## 2018-09-08 DIAGNOSIS — I82512 Chronic embolism and thrombosis of left femoral vein: Secondary | ICD-10-CM | POA: Diagnosis present

## 2018-09-08 DIAGNOSIS — Z7902 Long term (current) use of antithrombotics/antiplatelets: Secondary | ICD-10-CM | POA: Diagnosis not present

## 2018-09-08 DIAGNOSIS — M170 Bilateral primary osteoarthritis of knee: Secondary | ICD-10-CM | POA: Diagnosis present

## 2018-09-08 DIAGNOSIS — Z803 Family history of malignant neoplasm of breast: Secondary | ICD-10-CM | POA: Diagnosis not present

## 2018-09-08 DIAGNOSIS — S32409A Unspecified fracture of unspecified acetabulum, initial encounter for closed fracture: Secondary | ICD-10-CM | POA: Diagnosis present

## 2018-09-08 DIAGNOSIS — Z79899 Other long term (current) drug therapy: Secondary | ICD-10-CM | POA: Diagnosis not present

## 2018-09-08 HISTORY — PX: PERIPHERAL VASCULAR INTERVENTION: CATH118257

## 2018-09-08 HISTORY — PX: LOWER EXTREMITY VENOGRAPHY: CATH118253

## 2018-09-08 LAB — BASIC METABOLIC PANEL
Anion gap: 10 (ref 5–15)
BUN: 7 mg/dL — ABNORMAL LOW (ref 8–23)
CO2: 24 mmol/L (ref 22–32)
Calcium: 9.4 mg/dL (ref 8.9–10.3)
Chloride: 103 mmol/L (ref 98–111)
Creatinine, Ser: 0.66 mg/dL (ref 0.44–1.00)
GFR calc Af Amer: >60 mL/min (ref >60)
GFR calc non Af Amer: >60 mL/min (ref >60)
Glucose, Bld: 106 mg/dL — ABNORMAL HIGH (ref 70–99)
Potassium: 3.6 mmol/L (ref 3.5–5.1)
Sodium: 137 mmol/L (ref 135–145)

## 2018-09-08 LAB — CBC
HCT: 25.8 % — ABNORMAL LOW (ref 36.0–46.0)
Hemoglobin: 8.2 g/dL — ABNORMAL LOW (ref 12.0–15.0)
MCH: 27.7 pg (ref 26.0–34.0)
MCHC: 31.8 g/dL (ref 30.0–36.0)
MCV: 87.2 fL (ref 80.0–100.0)
Platelets: 245 10*3/uL (ref 150–400)
RBC: 2.96 MIL/uL — ABNORMAL LOW (ref 3.87–5.11)
RDW: 18.5 % — ABNORMAL HIGH (ref 11.5–15.5)
WBC: 2.8 10*3/uL — ABNORMAL LOW (ref 4.0–10.5)
nRBC: 0 % (ref 0.0–0.2)

## 2018-09-08 LAB — APTT: aPTT: 94 seconds — ABNORMAL HIGH (ref 24–36)

## 2018-09-08 LAB — MRSA PCR SCREENING: MRSA by PCR: NEGATIVE

## 2018-09-08 SURGERY — LOWER EXTREMITY VENOGRAPHY
Anesthesia: LOCAL

## 2018-09-08 MED ORDER — MORPHINE SULFATE 15 MG PO TABS
15.0000 mg | ORAL_TABLET | Freq: Four times a day (QID) | ORAL | Status: DC | PRN
  Administered 2018-09-08 (×2): 15 mg via ORAL
  Filled 2018-09-08 (×2): qty 1

## 2018-09-08 MED ORDER — LIDOCAINE HCL (PF) 1 % IJ SOLN
INTRAMUSCULAR | Status: DC | PRN
  Administered 2018-09-08: 15 mL via INTRADERMAL

## 2018-09-08 MED ORDER — SODIUM CHLORIDE 0.9% FLUSH: 3.0000 mL | Freq: Two times a day (BID) | INTRAVENOUS | Status: DC

## 2018-09-08 MED ORDER — SODIUM CHLORIDE 0.9 % IV SOLN
INTRAVENOUS | Status: DC
  Administered 2018-09-08: 12:00:00 via INTRAVENOUS

## 2018-09-08 MED ORDER — FENTANYL CITRATE (PF) 100 MCG/2ML IJ SOLN
INTRAMUSCULAR | Status: AC
  Filled 2018-09-08: qty 2

## 2018-09-08 MED ORDER — ONDANSETRON HCL 4 MG/2ML IJ SOLN: 4.0000 mg | Freq: Four times a day (QID) | INTRAMUSCULAR | Status: DC | PRN

## 2018-09-08 MED ORDER — HYDRALAZINE HCL 20 MG/ML IJ SOLN: 5.0000 mg | INTRAMUSCULAR | Status: DC | PRN

## 2018-09-08 MED ORDER — SODIUM CHLORIDE 0.9 % IV SOLN: 250.0000 mL | INTRAVENOUS | Status: DC | PRN

## 2018-09-08 MED ORDER — MIDAZOLAM HCL 2 MG/2ML IJ SOLN
INTRAMUSCULAR | Status: AC
  Filled 2018-09-08: qty 2

## 2018-09-08 MED ORDER — MIDAZOLAM HCL 2 MG/2ML IJ SOLN
INTRAMUSCULAR | Status: DC | PRN
  Administered 2018-09-08: 0.5 mg via INTRAVENOUS

## 2018-09-08 MED ORDER — HEPARIN SODIUM (PORCINE) 1000 UNIT/ML IJ SOLN
INTRAMUSCULAR | Status: DC | PRN
  Administered 2018-09-08: 10000 [IU] via INTRAVENOUS

## 2018-09-08 MED ORDER — CYCLOSPORINE 0.05 % OP EMUL
1.0000 [drp] | Freq: Two times a day (BID) | OPHTHALMIC | Status: DC
  Administered 2018-09-08 (×2): 1 [drp] via OPHTHALMIC
  Filled 2018-09-08 (×7): qty 30

## 2018-09-08 MED ORDER — ENSURE ENLIVE PO LIQD
237.0000 mL | Freq: Two times a day (BID) | ORAL | Status: DC
  Administered 2018-09-09 – 2018-09-10 (×3): 237 mL via ORAL

## 2018-09-08 MED ORDER — CLOPIDOGREL BISULFATE 75 MG PO TABS
300.0000 mg | ORAL_TABLET | Freq: Once | ORAL | Status: AC
  Administered 2018-09-08: 300 mg via ORAL
  Filled 2018-09-08: qty 4

## 2018-09-08 MED ORDER — ONDANSETRON HCL 4 MG PO TABS: 4.0000 mg | ORAL_TABLET | Freq: Four times a day (QID) | ORAL | Status: DC | PRN

## 2018-09-08 MED ORDER — MORPHINE SULFATE 15 MG PO TABS
15.0000 mg | ORAL_TABLET | ORAL | Status: DC | PRN
  Administered 2018-09-09: 15 mg via ORAL
  Filled 2018-09-08: qty 1

## 2018-09-08 MED ORDER — MORPHINE SULFATE ER 15 MG PO TBCR
30.0000 mg | EXTENDED_RELEASE_TABLET | Freq: Two times a day (BID) | ORAL | Status: DC
  Administered 2018-09-08 – 2018-09-10 (×5): 30 mg via ORAL
  Filled 2018-09-08 (×5): qty 2

## 2018-09-08 MED ORDER — SODIUM CHLORIDE 0.9 % IV SOLN
INTRAVENOUS | Status: AC
  Administered 2018-09-08: 18:00:00 via INTRAVENOUS

## 2018-09-08 MED ORDER — FENTANYL CITRATE (PF) 100 MCG/2ML IJ SOLN
INTRAMUSCULAR | Status: DC | PRN
  Administered 2018-09-08 (×3): 25 ug via INTRAVENOUS

## 2018-09-08 MED ORDER — IODIXANOL 320 MG/ML IV SOLN
INTRAVENOUS | Status: DC | PRN
  Administered 2018-09-08: 25 mL via INTRAVENOUS

## 2018-09-08 MED ORDER — HEPARIN (PORCINE) IN NACL 1000-0.9 UT/500ML-% IV SOLN
INTRAVENOUS | Status: DC | PRN
  Administered 2018-09-08 (×2): 500 mL

## 2018-09-08 MED ORDER — IOHEXOL 350 MG/ML SOLN
125.0000 mL | Freq: Once | INTRAVENOUS | Status: AC | PRN
  Administered 2018-09-08: 125 mL via INTRAVENOUS

## 2018-09-08 MED ORDER — ACETAMINOPHEN 325 MG PO TABS: 650.0000 mg | ORAL_TABLET | ORAL | Status: DC | PRN

## 2018-09-08 MED ORDER — HEPARIN (PORCINE) IN NACL 1000-0.9 UT/500ML-% IV SOLN
INTRAVENOUS | Status: AC
  Filled 2018-09-08: qty 500

## 2018-09-08 MED ORDER — LIDOCAINE HCL (PF) 1 % IJ SOLN
INTRAMUSCULAR | Status: AC
  Filled 2018-09-08: qty 30

## 2018-09-08 MED ORDER — ACETAMINOPHEN 325 MG PO TABS: 650.0000 mg | ORAL_TABLET | Freq: Four times a day (QID) | ORAL | Status: DC | PRN

## 2018-09-08 MED ORDER — LABETALOL HCL 5 MG/ML IV SOLN: 10.0000 mg | INTRAVENOUS | Status: DC | PRN

## 2018-09-08 MED ORDER — ACETAMINOPHEN 650 MG RE SUPP: 650.0000 mg | Freq: Four times a day (QID) | RECTAL | Status: DC | PRN

## 2018-09-08 MED ORDER — CLOPIDOGREL BISULFATE 75 MG PO TABS
75.0000 mg | ORAL_TABLET | Freq: Every day | ORAL | Status: DC
  Administered 2018-09-09 – 2018-09-10 (×2): 75 mg via ORAL
  Filled 2018-09-08 (×2): qty 1

## 2018-09-08 MED ORDER — SODIUM CHLORIDE 0.9% FLUSH: 3.0000 mL | INTRAVENOUS | Status: DC | PRN

## 2018-09-08 MED ORDER — HEPARIN (PORCINE) 25000 UT/250ML-% IV SOLN
1000.0000 [IU]/h | INTRAVENOUS | Status: DC
  Administered 2018-09-08: 1000 [IU]/h via INTRAVENOUS
  Filled 2018-09-08: qty 250

## 2018-09-08 MED ORDER — POTASSIUM CHLORIDE CRYS ER 20 MEQ PO TBCR
20.0000 meq | EXTENDED_RELEASE_TABLET | Freq: Every day | ORAL | Status: DC
  Administered 2018-09-09 – 2018-09-10 (×2): 20 meq via ORAL
  Filled 2018-09-08 (×4): qty 1

## 2018-09-08 SURGICAL SUPPLY — 17 items
BALLN ATLAS 14X40X75 (BALLOONS) ×3
BALLN MUSTANG 12X60X75 (BALLOONS) ×3
BALLOON ATLAS 14X40X75 (BALLOONS) IMPLANT
BALLOON MUSTANG 12X60X75 (BALLOONS) IMPLANT
CATH ANGIO 5F BER2 65CM (CATHETERS) ×1 IMPLANT
CATH RETRIEVER CLOT 16MMX105CM (CATHETERS) ×1 IMPLANT
CATH VISIONS PV .035 IVUS (CATHETERS) ×1 IMPLANT
GLIDEWIRE ADV .035X260CM (WIRE) ×1 IMPLANT
KIT ENCORE 26 ADVANTAGE (KITS) ×1 IMPLANT
KIT MICROPUNCTURE NIT STIFF (SHEATH) ×1 IMPLANT
KIT PV (KITS) ×3 IMPLANT
SHEATH CLOT RETRIEVER (SHEATH) ×1 IMPLANT
SHEATH PINNACLE 8F 10CM (SHEATH) ×1 IMPLANT
STENT VICI VENOUS 14X90 (Permanent Stent) ×1 IMPLANT
STENT WALLSTENT 14X90X75 (Permanent Stent) ×1 IMPLANT
SYR MEDRAD MARK V 150ML (SYRINGE) ×1 IMPLANT
TRAY PV CATH (CUSTOM PROCEDURE TRAY) ×3 IMPLANT

## 2018-09-08 NOTE — Progress Notes (Signed)
Initial Nutrition Assessment  DOCUMENTATION CODES:   Morbid obesity  INTERVENTION:  Once diet advances, continue Ensure Enlive po BID, each supplement provides 350 kcal and 20 grams of protein.  Encourage adequate PO intake.   NUTRITION DIAGNOSIS:   Increased nutrient needs related to cancer and cancer related treatments as evidenced by estimated needs.  GOAL:   Patient will meet greater than or equal to 90% of their needs  MONITOR:   PO intake, Supplement acceptance, Skin, Weight trends, Labs, I & O's  REASON FOR ASSESSMENT:   Malnutrition Screening Tool    ASSESSMENT:   73 y.o. female with known history of DVT of the left lower extremity diagnosed with uterine cancer underwent radiation and planned chemotherapy presents with increasing swelling and wound on the left lower extremity with some weeping.  Pt is currently NPO for procedure today. Pt reports having a decreased appetite which has been ongoing due to cancer and cancer related treatments. Pt reports she still tries to consume at least 2-3 meals a day with a Boost shake once daily. Per weight records, pt with a 10.8% weight loss in 6 months, which is significant for time frame. Pt educated on high protein diet to aid in increased nutrient needs. RD to order nutritional supplements to aid in caloric and protein needs.  Labs and medications reviewed.   NUTRITION - FOCUSED PHYSICAL EXAM:    Most Recent Value  Orbital Region  Unable to assess  Upper Arm Region  No depletion  Thoracic and Lumbar Region  No depletion  Buccal Region  Unable to assess  Temple Region  Unable to assess  Clavicle Bone Region  Mild depletion  Clavicle and Acromion Bone Region  Mild depletion  Scapular Bone Region  Unable to assess  Dorsal Hand  Unable to assess  Patellar Region  No depletion  Anterior Thigh Region  No depletion  Posterior Calf Region  No depletion  Edema (RD Assessment)  Moderate  Hair  Unable to assess  Eyes   Reviewed  Mouth  Reviewed  Skin  Reviewed  Nails  Reviewed       Diet Order:   Diet Order            Diet NPO time specified  Diet effective now              EDUCATION NEEDS:   Education needs have been addressed  Skin:  Skin Assessment: Reviewed RN Assessment  Last BM:  7/20  Height:   Ht Readings from Last 1 Encounters:  09/07/18 5\' 1"  (1.549 m)    Weight:   Wt Readings from Last 1 Encounters:  09/07/18 99.8 kg    Ideal Body Weight:  47.7 kg  BMI:  Body mass index is 41.57 kg/m.  Estimated Nutritional Needs:   Kcal:  6834-1962  Protein:  90-105 grams  Fluid:  1.7 - 1.9 L/day    Corrin Parker, MS, RD, LDN Pager # [phone] After hours/ weekend pager # [phone]

## 2018-09-08 NOTE — Progress Notes (Addendum)
[facility] PROGRESS NOTE  [name] QQP:[id number] DOB: 1945-12-29 DOA: 09/07/2018 PCP: Nolene Ebbs, MD  Assessment/Plan:  Left leg DVT with worsening swelling and pain and skin excoriation. Known history of DVT LLL s/p stent. Evaluated by vascular surgery who opined possible occlusion of stent or recurrent DVT recommending venogram. 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 Heparin gtt initiated. To OR this afternoon. Of note, patient reports taking xarelto in am but does not eat a large breakfast. ? Absorption if taking without food? Uterine cancer being followed by Dr. Rozetta Nunnery oncologist. Currently chemo and radiation Pancytopenia likely from treatment for uterine cancer. Stable.     Code Status: full Family Communication: patient Disposition Plan: home when ready   Consultants: Donzetta Matters vascular surgery  Procedures:   Antibiotics:   HPI/Subjective: Awake alert. No acute distress. Remains NPO in anticipation of vascular procedure  Objective: Vitals:   09/08/18 0327 09/08/18 0803  BP: (!) 121/50 (!) 110/59  Pulse: 94 98  Resp: 15 16  Temp: 97.7 F (36.5 C) 98 F (36.7 C)  SpO2: 100% 100%    Intake/Output Summary (Last 24 hours) at 09/08/2018 1049 Last data filed at 09/08/2018 0844 Gross per 24 hour  Intake 0 ml  Output --  Net 0 ml   Filed Weights   09/07/18 1156  Weight: 99.8 kg    Exam:  General:  Awake alert no acute distress Cardiovascular: rrr no mgr 2-3+ LE edema left greater than right. Chronic venous stasis changes. Feet warm to touch Respiratory: normal effort BS clear bilaterally no wheeze Abdomen: soft non-distended +BS no guarding or rebounding Musculoskeletal: joints without swelling/erythema   Data Reviewed: Basic Metabolic Panel: Recent Labs  Lab 09/07/18 1654 09/08/18 0238  NA 136 137  K 3.3* 3.6  CL 101 103  CO2 22 24  GLUCOSE 102* 106*  BUN 10 7*  CREATININE 0.64 0.66   CALCIUM 9.5 9.4   Liver Function Tests: Recent Labs  Lab 09/07/18 1654  AST 19  ALT 18  ALKPHOS 73  BILITOT 0.7  PROT 8.1  ALBUMIN 3.6   No results for input(s): LIPASE, AMYLASE in the last 168 hours. No results for input(s): AMMONIA in the last 168 hours. CBC: Recent Labs  Lab 09/07/18 1654 09/08/18 0238  WBC 2.6* 2.8*  NEUTROABS 1.2*  --   HGB 8.3* 8.2*  HCT 27.4* 25.8*  MCV 91.0 87.2  PLT 242 245   Cardiac Enzymes: No results for input(s): CKTOTAL, CKMB, CKMBINDEX, TROPONINI in the last 168 hours. BNP (last 3 results) No results for input(s): BNP in the last 8760 hours.  ProBNP (last 3 results) No results for input(s): PROBNP in the last 8760 hours.  CBG: No results for input(s): GLUCAP in the last 168 hours.  Recent Results (from the past 240 hour(s))  SARS Coronavirus 2 (CEPHEID - Performed in [place] hospital lab), Hosp Order     Status: None   Collection Time: 09/07/18  6:42 PM   Specimen: Nasopharyngeal Swab  Result Value Ref Range Status   SARS Coronavirus 2 NEGATIVE NEGATIVE Final    Comment: (NOTE) If result is NEGATIVE SARS-CoV-2 target nucleic acids are NOT DETECTED. The SARS-CoV-2 RNA is generally detectable in upper and lower  respiratory specimens during the acute phase of infection. The lowest  concentration of SARS-CoV-2 viral copies this assay can detect is 250  copies / mL. A negative result does not preclude SARS-CoV-2 infection  and should not be used as  the sole basis for treatment or other  patient management decisions.  A negative result may occur with  improper specimen collection / handling, submission of specimen other  than nasopharyngeal swab, presence of viral mutation(s) within the  areas targeted by this assay, and inadequate number of viral copies  (<250 copies / mL). A negative result must be combined with clinical  observations, patient history, and epidemiological information. If result is POSITIVE SARS-CoV-2 target  nucleic acids are DETECTED. The SARS-CoV-2 RNA is generally detectable in upper and lower  respiratory specimens dur ing the acute phase of infection.  Positive  results are indicative of active infection with SARS-CoV-2.  Clinical  correlation with patient history and other diagnostic information is  necessary to determine patient infection status.  Positive results do  not rule out bacterial infection or co-infection with other viruses. If result is PRESUMPTIVE POSTIVE SARS-CoV-2 nucleic acids MAY BE PRESENT.   A presumptive positive result was obtained on the submitted specimen  and confirmed on repeat testing.  While 2019 novel coronavirus  (SARS-CoV-2) nucleic acids may be present in the submitted sample  additional confirmatory testing may be necessary for epidemiological  and / or clinical management purposes  to differentiate between  SARS-CoV-2 and other Sarbecovirus currently known to infect humans.  If clinically indicated additional testing with an alternate test  methodology [phone]) is advised. The SARS-CoV-2 RNA is generally  detectable in upper and lower respiratory sp ecimens during the acute  phase of infection. The expected result is Negative. Fact Sheet for Patients:  StrictlyIdeas.no Fact Sheet for Healthcare Providers: BankingDealers.co.za This test is not yet approved or cleared by the [place] FDA and has been authorized for detection and/or diagnosis of SARS-CoV-2 by FDA under an Emergency Use Authorization (EUA).  This EUA will remain in effect (meaning this test can be used) for the duration of the COVID-19 declaration under Section 564(b)(1) of the Act, 21 U.S.C. section 360bbb-3(b)(1), unless the authorization is terminated or revoked sooner. Performed at [facility], [place] [address] [place], [place] [address]   MRSA PCR Screening     Status: None   Collection Time: 09/08/18 12:34  AM   Specimen: Nasopharyngeal  Result Value Ref Range Status   MRSA by PCR NEGATIVE NEGATIVE Final    Comment:        The GeneXpert MRSA Assay (FDA approved for NASAL specimens only), is one component of a comprehensive MRSA colonization surveillance program. It is not intended to diagnose MRSA infection nor to guide or monitor treatment for MRSA infections. Performed at [facility], [place] [address] [place], [place] [address]      Studies: Ct Venogram Abd/pel  Result Date: 09/08/2018 CLINICAL DATA:  Leg swelling, pain. History of uterine cancer post XRT, currently on chemotherapy for left pelvic mass, on Xarelto EXAM: CTA ABDOMEN AND PELVIS WITH CONTRAST TECHNIQUE: Multidetector CT imaging of the abdomen and pelvis was performed using the standard protocol during bolus administration of intravenous contrast. Multiplanar reconstructed images and MIPs were obtained and reviewed to evaluate the vascular anatomy. CONTRAST:  111mL OMNIPAQUE IOHEXOL 350 MG/ML SOLN COMPARISON:  PET-CT 07/02/2018 and previous FINDINGS: VASCULAR Aorta: Normal caliber aorta without aneurysm, dissection, vasculitis or significant stenosis. Celiac: Patent without evidence of aneurysm, dissection, vasculitis or significant stenosis. SMA: Patent without evidence of aneurysm, dissection, vasculitis or significant stenosis. Renals: Both renal arteries are patent without evidence of aneurysm, dissection, vasculitis, fibromuscular dysplasia or significant stenosis. IMA: Patent without evidence  of aneurysm, dissection, vasculitis or significant stenosis. Inflow: Patent without evidence of aneurysm, dissection, vasculitis or significant stenosis. Proximal Outflow: Bilateral common femoral and visualized portions of the superficial and profunda femoral arteries are patent without evidence of aneurysm, dissection, vasculitis or significant stenosis. Veins: On the left, incompletely occlusive DVT in the common femoral vein  extending across the saphenofemoral junction. Long segment chronic occlusion of the left external vein. The left common/external iliac venous stent is chronically occluded. Nonenhancement of left internal iliac branches. Continued patency of left common iliac vein centrally. Multiple enlarged body wall venous collaterals provide drainage of the left lower extremity. On the right, patent visualized deep venous system of the right lower extremity. Iliac venous system unremarkable. IVC patent. Patent bilateral renal veins. Patent superior mesenteric and splenic veins. Patent somewhat diminutive portal vein. Patent hepatic veins. Review of the MIP images confirms the above findings. NON-VASCULAR Lower chest: No pleural or pericardial effusion. Heart size normal. Hepatobiliary: No focal liver abnormality is seen. Status post cholecystectomy. No biliary dilatation. Pancreas: Unremarkable. No pancreatic ductal dilatation or surrounding inflammatory changes. Spleen: Normal in size without focal abnormality. Adrenals/Urinary Tract: Mild adrenal hypertrophy. No renal mass or hydronephrosis. Urinary bladder nondistended. Stomach/Bowel: Stomach is decompressed. Small bowel is nondistended. Normal appendix. Colon is nondilated with innumerable scattered diverticula, but no significant adjacent inflammatory/edematous change or abscess. Lymphatic: No abdominal or pelvic adenopathy. Reproductive: Status post hysterectomy. No adnexal masses. Other: Left pelvic sidewall mass 8.1 x 3.9 cm maximum transverse dimensions (previously 8.7 x 4.6) with probable involvement of the medial wall of the left acetabulum, and pathologic fracture of ischium as before. Musculoskeletal: Pathologic fracture of the ischium, medial and superior acetabulum secondary to permeative lesion presumably metastasis as before. 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 Spondylitic changes throughout the lumbar spine. No new fracture or worrisome bone lesion. IMPRESSION: 1. 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 2. Slight decrease in size of left pelvic sidewall mass. 3. 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 4. Colonic diverticulosis. Electronically Signed   By: Lucrezia Europe M.D.   On: 09/08/2018 08:09   Vas [place] Lower Extremity Venous (dvt)  Result Date: 09/07/2018  Lower Venous Study Indications: Edema. Other Indications: Hx iliac DVT and iliac stent placement. Limitations: Body habitus and poor ultrasound/tissue interface. Comparison Study: 04/12/18 negative Performing Technologist: June Leap RDMS, RVT  Examination Guidelines: A complete evaluation includes B-mode imaging, spectral Doppler, color Doppler, and power Doppler as needed of all accessible portions of each vessel. Bilateral testing is considered an integral part of a complete examination. Limited examinations for reoccurring indications may be performed as noted.  +---------+---------------+---------+-----------+----------+--------------+ LEFT     CompressibilityPhasicitySpontaneityPropertiesSummary        +---------+---------------+---------+-----------+----------+--------------+ CFV      Partial        Yes      Yes                                 +---------+---------------+---------+-----------+----------+--------------+ SFJ      Partial        Yes      Yes                                 +---------+---------------+---------+-----------+----------+--------------+ FV Prox  Partial        Yes  Yes                                 +---------+---------------+---------+-----------+----------+--------------+ FV Mid   Full                                                        +---------+---------------+---------+-----------+----------+--------------+ FV DistalFull                                                         +---------+---------------+---------+-----------+----------+--------------+ POP                     Yes      Yes                                 +---------+---------------+---------+-----------+----------+--------------+ PTV                                                   Not visualized +---------+---------------+---------+-----------+----------+--------------+ PERO                                                  Not visualized +---------+---------------+---------+-----------+----------+--------------+   Left Technical Findings: Technically limited study. Unable to visualize more proximally into iliac veins.   Summary: Left: Findings consistent with age indeterminate deep vein thrombosis involving the left common femoral vein, and left proximal femoral vein.  *See table(s) above for measurements and observations. Electronically signed by Servando Snare MD on 09/07/2018 at 9:41:52 PM.    Final     Scheduled Meds:  cycloSPORINE  1 drop Both Eyes BID   feeding supplement (ENSURE ENLIVE)  237 mL Oral BID BM   morphine  30 mg Oral Q12H   potassium chloride SA  20 mEq Oral Daily   Continuous Infusions:  sodium chloride     heparin 1,200 Units/hr (09/08/18 0924)    Principal Problem:   Left leg DVT ([place]) Active Problems:   Uterine cancer (HCC)   Pancytopenia, acquired ([place])    Time spent: 46 minutes    [place] NP  [facility]  If 7PM-7AM, please contact night-coverage at [email or web address], password [facility] 09/08/2018, 10:49 AM  LOS: 0 days

## 2018-09-08 NOTE — Progress Notes (Addendum)
[place] for IV Heparin Indication: DVT  No Known Allergies  Patient Measurements: Height: 5\' 1"  (154.9 cm) Weight: 220 lb (99.8 kg) IBW/kg (Calculated) : 47.8 Heparin Dosing Weight: 71.8 kg  Vital Signs: Temp: 98.1 F (36.7 C) (07/21 1600) Temp Source: Oral (07/21 1600) BP: 135/77 (07/21 1529) Pulse Rate: 0 (07/21 1534)  Labs: Recent Labs    09/07/18 1654 09/07/18 1947 09/08/18 0238  HGB 8.3*  --  8.2*  HCT 27.4*  --  25.8*  PLT 242  --  245  APTT 43*  --   --   HEPARINUNFRC  --  >2.20*  --   CREATININE 0.64  --  0.66    Estimated Creatinine Clearance: 67.8 mL/min (by C-G formula based on SCr of 0.66 mg/dL).   Medical History: Past Medical History:  Diagnosis Date  . Acute upper respiratory infection 07/06/2014  . Anemia   . Arthritis    Back   . Colon polyp    Tubular Adenoma   . Cough productive of clear sputum 06/22/2014  . Family history of breast cancer   . GERD (gastroesophageal reflux disease)   . History of right bundle branch block (RBBB)   . HOH (hard of hearing)   . Hypertension    had in the past, is no longer on medication for this and blood pressures are WNL  . 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 Primary localized osteoarthritis of right knee   . Uterine cancer ([place]) 06/23/2018    Assessment: 73 yo female with new DVT to re-start IV heparin 2 hrs after sheath pull from today's procedure per pharmacy dosing. Pt is S/P vascular surgery this afternoon (including stenting of L common external iliac vein, common femoral vein). Of note, pt was on Xarelto 20 mg once daily PTA for hx DVT (last dose 7/20 at 0900). Patient was receiving heparin 1200 units/hr prior to the procedure.  Patient rec'd heparin 10,000 units IV bolus in surgery at 13:42 this afternoon.  Baseline heparin level at 19:47 on 7/20 was >2.20 units/ml, indicating effect of Xarelto on heparin level. Pt's aPTT on 7/20 at 16:54 was 43 sec. Given hx of Xarelto  PTA and elevated baseline heparin level on admission, will adjust heparin dosing by aPTT initially.  Goal of Therapy:  Heparin level 0.3-0.7 units/ml Monitor platelets by anticoagulation protocol: Yes  aPTT goal: 66-102 sec   Plan:  - Restart heparin infusion at 1000 units/hr 2 hrs after sheath pull, with no bolus (restarting a lower rate than prior to surgery, due to heparin 10,000 units bolus rec'd in surgery); per RN, sheath pulled at 15:30, so will restart heparin infusion at 17:30 - Check aPTT and heparin level ~8 hrs after starting the heparin infusion and adjust as needed - Monitor daily CBC, heparin level, aPTT  - Monitor for signs/symptoms of bleeding  Gillermina Hu, PharmD, BCPS, [facility] Clinical Pharmacist 09/08/2018 4:35 PM

## 2018-09-08 NOTE — Op Note (Signed)
    Patient name: [name] MRN: [id number] DOB: [birth year] Sex: female  09/08/2018 Pre-operative Diagnosis: Left lower extremity DVT with occluded left common external iliac vein stents Post-operative diagnosis:  Same Surgeon:  Erlene Quan C. Donzetta Matters, MD Procedure Performed: 1.  Ultrasound-guided cannulation left popliteal vein 2.  Intravascular ultrasound left femoral, common femoral, common external iliac veins and IVC 3.  Left lower extremity and central venography 4.  Mechanical thrombectomy of left common external iliac veins and left common femoral vein and femoral veins 5.  Stent of left common external iliac veins with 14 x 30mm Vici and stent of left external iliac vein and common femoral vein with 14 x 90 Wallstent 6.  Moderate sedation with fentanyl and Versed for 84 minutes  Indications: 73 year old female with history of endometrial cancer.  She had undergone stenting of her left common external iliac veins for this.  Stent is now occluded.  She has a wound on her left leg.  She has significant swelling of the left lower extremity as well.  Findings: Stent was fully occluded.  She has pain in the IVC.  Had chronic and acute appearing thrombus extending down to the common femoral vein on the left.  External leg vein peripheral from the stent was quite diminutive.  There was some chronic disease in the femoral vein.  After stenting there is a good flow channel from the common femoral vein where it is healthy up to the IVC where it is healthy.  There is remains some chronic thrombus in the femoral vein on the left.   Procedure:  The patient was identified in the holding area and taken to room 8.  The patient was then placed prone on the table where she was sterilely prepped and draped in the left popliteal fossae.  Timeout was called.  Ultrasound was used to identify the popliteal vein.  Images saved the permanent record.  Fentanyl and Versed were administered vital signs were followed  throughout this case.  Popliteal vein was cannulated direct ultrasound visualization with micropuncture needle followed the wire and sheath.  Glidewire advantage was placed followed by 8 [place] sheath.  Patient was fully heparinized bare catheter was used to direct Glidewire advantage into the IVC and central venogram performed confirmed intraluminal access.  Guidewire venous placed to the innominate vein on the right.  Intravascular ultrasound performed from the IV see all the way down to the femoral vein.  Inari a mechanical thrombectomy was used for multiple passes.  We did remove some of the existing Vici stent but was all intact otherwise nothing appeared to embolize.  Balloon angioplasty then performed with 12 mm balloon from the common iliac vein down to the femoral vein including the external and common femoral veins.  We then marked our area for stenting with IVus.  First a 14 x 90 self-expanding stent was brought to the common external leg veins followed by 1490 extending below the inguinal ligament which was a Wallstent.  These were postdilated with 40 mm balloon.  Diameter of the common iliac vein was 13 mm at its greatest diameter.  There was a brisk flow channel no collaterals with venography.  Satisfied with this catheters and wires were removed.  Sheath was pulled pressure held initially and then a suture bolster was placed.  She tolerated this procedure well immediate complication  Contrast: 92JJ  [name] C. Donzetta Matters, MD [facility] [place] Office: [phone] Pager: [phone]

## 2018-09-08 NOTE — Progress Notes (Signed)
Patient has gone down for CT.

## 2018-09-08 NOTE — Progress Notes (Signed)
  Progress Note    09/08/2018 11:13 AM   Subjective:  Still having left leg pain  Vitals:   09/08/18 0327 09/08/18 0803  BP: (!) 121/50 (!) 110/59  Pulse: 94 98  Resp: 15 16  Temp: 97.7 F (36.5 C) 98 F (36.7 C)  SpO2: 100% 100%    Physical Exam: Awake alert oriented Nonlabored respirations Abdomen is soft Left leg is tight throughout the left lower extremity  CBC    Component Value Date/Time   WBC 2.8 (L) 09/08/2018 0238   RBC 2.96 (L) 09/08/2018 0238   HGB 8.2 (L) 09/08/2018 0238   HGB 8.1 (L) 08/24/2018 0927   HCT 25.8 (L) 09/08/2018 0238   PLT 245 09/08/2018 0238   PLT 216 08/24/2018 0927   MCV 87.2 09/08/2018 0238   MCH 27.7 09/08/2018 0238   MCHC 31.8 09/08/2018 0238   RDW 18.5 (H) 09/08/2018 0238   LYMPHSABS 0.7 09/07/2018 1654   MONOABS 0.5 09/07/2018 1654   EOSABS 0.0 09/07/2018 1654   BASOSABS 0.0 09/07/2018 1654    BMET    Component Value Date/Time   NA 137 09/08/2018 0238   K 3.6 09/08/2018 0238   CL 103 09/08/2018 0238   CO2 24 09/08/2018 0238   GLUCOSE 106 (H) 09/08/2018 0238   BUN 7 (L) 09/08/2018 0238   CREATININE 0.66 09/08/2018 0238   CREATININE 0.72 08/24/2018 0927   CALCIUM 9.4 09/08/2018 0238   GFRNONAA >60 09/08/2018 0238   GFRNONAA >60 08/24/2018 0927   GFRAA >60 09/08/2018 0238   GFRAA >60 08/24/2018 0927    INR    Component Value Date/Time   INR 1.3 (H) 06/29/2018 1325     Intake/Output Summary (Last 24 hours) at 09/08/2018 1113 Last data filed at 09/08/2018 0844 Gross per 24 hour  Intake 0 ml  Output -  Net 0 ml     Assessment/plan:  73 y.o. female is here with persistent edema.  Does have endometrial cancer has undergone radiation currently on chemotherapy.  Taking Xarelto.  May need to switch to Lovenox or another agent.  We will plan for left lower extremity venogram possible thrombectomy based on today's CT venogram results which I discussed with her.   Elvis Laufer C. Donzetta Matters, MD [facility]  [place] Office: [phone] Pager: [phone]  09/08/2018 11:13 AM

## 2018-09-08 NOTE — Progress Notes (Signed)
VASCULAR SURGERY:  Called to see patient because of 10 out of 10 pain in her left leg.  She was sitting up in bed and the wrap looked fairly tight.  I repositioned her with her legs elevated and remove the wraps and she felt much better.  I did not see any hematoma in the popliteal fossa.  She is on a large amount of pain medicine because of her history of cancer.  I have increased her dose to 15 mg of po morphine every 4 hours instead of every 6 hours as needed.  Leslie Mayo, MD, [place] [phone] Office: [phone]

## 2018-09-08 NOTE — Progress Notes (Addendum)
Patient has arrived and has gotten settled. Transported by [facility] to 5N18. Patient current does not have any pain. Edema on both lower extremities noted and open skin area to left lower leg. Numbeness/ decreased sensation in left leg. Dressed with foam. Will continue to monitor.

## 2018-09-08 NOTE — Progress Notes (Signed)
Dr. Scot Dock was notified of pt c/o severe unrelieved pain in left leg despite prn MSIR given.  He will come to assess patient.

## 2018-09-09 ENCOUNTER — Encounter (HOSPITAL_COMMUNITY): Payer: Self-pay | Admitting: Vascular Surgery

## 2018-09-09 ENCOUNTER — Telehealth: Payer: Self-pay

## 2018-09-09 LAB — BASIC METABOLIC PANEL
Anion gap: 10 (ref 5–15)
BUN: 5 mg/dL — ABNORMAL LOW (ref 8–23)
CO2: 24 mmol/L (ref 22–32)
Calcium: 9 mg/dL (ref 8.9–10.3)
Chloride: 104 mmol/L (ref 98–111)
Creatinine, Ser: 0.62 mg/dL (ref 0.44–1.00)
GFR calc Af Amer: >60 mL/min (ref >60)
GFR calc non Af Amer: >60 mL/min (ref >60)
Glucose, Bld: 105 mg/dL — ABNORMAL HIGH (ref 70–99)
Potassium: 3.3 mmol/L — ABNORMAL LOW (ref 3.5–5.1)
Sodium: 138 mmol/L (ref 135–145)

## 2018-09-09 LAB — CBC
HCT: 23.2 % — ABNORMAL LOW (ref 36.0–46.0)
Hemoglobin: 7.3 g/dL — ABNORMAL LOW (ref 12.0–15.0)
MCH: 28 pg (ref 26.0–34.0)
MCHC: 31.5 g/dL (ref 30.0–36.0)
MCV: 88.9 fL (ref 80.0–100.0)
Platelets: 209 10*3/uL (ref 150–400)
RBC: 2.61 MIL/uL — ABNORMAL LOW (ref 3.87–5.11)
RDW: 18.6 % — ABNORMAL HIGH (ref 11.5–15.5)
WBC: 3.3 10*3/uL — ABNORMAL LOW (ref 4.0–10.5)
nRBC: 0 % (ref 0.0–0.2)

## 2018-09-09 LAB — APTT
aPTT: 100 seconds — ABNORMAL HIGH (ref 24–36)
aPTT: 101 seconds — ABNORMAL HIGH (ref 24–36)

## 2018-09-09 LAB — HEPARIN LEVEL (UNFRACTIONATED)
Heparin Unfractionated: 1.07 IU/mL — ABNORMAL HIGH (ref 0.30–0.70)
Heparin Unfractionated: 0.91 IU/mL — ABNORMAL HIGH (ref 0.30–0.70)

## 2018-09-09 MED ORDER — CLOPIDOGREL BISULFATE 75 MG PO TABS: 75.0000 mg | ORAL_TABLET | Freq: Every day | ORAL | 11 refills | Status: DC

## 2018-09-09 MED ORDER — POTASSIUM CHLORIDE CRYS ER 20 MEQ PO TBCR
40.0000 meq | EXTENDED_RELEASE_TABLET | Freq: Once | ORAL | Status: AC
  Administered 2018-09-09: 40 meq via ORAL
  Filled 2018-09-09: qty 2

## 2018-09-09 MED ORDER — APIXABAN 5 MG PO TABS
10.0000 mg | ORAL_TABLET | Freq: Two times a day (BID) | ORAL | Status: DC
  Administered 2018-09-09 – 2018-09-10 (×3): 10 mg via ORAL
  Filled 2018-09-09 (×4): qty 2

## 2018-09-09 MED ORDER — APIXABAN 5 MG PO TABS: 5.0000 mg | ORAL_TABLET | Freq: Two times a day (BID) | ORAL | Status: DC

## 2018-09-09 NOTE — Progress Notes (Signed)
[facility] for IV Heparin Indication: DVT  No Known Allergies  Patient Measurements: Height: 5\' 1"  (154.9 cm) Weight: 220 lb (99.8 kg) IBW/kg (Calculated) : 47.8 Heparin Dosing Weight: 71.8 kg  Vital Signs: Temp: 98.3 F (36.8 C) (07/21 1952) Temp Source: Oral (07/21 1952) BP: 110/53 (07/21 1952) Pulse Rate: 110 (07/21 1952)  Labs: Recent Labs    09/07/18 1654 09/07/18 1947 09/08/18 0238 09/08/18 1856 09/09/18 0046  HGB 8.3*  --  8.2*  --  7.3*  HCT 27.4*  --  25.8*  --  23.2*  PLT 242  --  245  --  209  APTT 43*  --   --  94* 100*  HEPARINUNFRC  --  >2.20*  --   --  1.07*  CREATININE 0.64  --  0.66  --  0.62    Estimated Creatinine Clearance: 67.8 mL/min (by C-G formula based on SCr of 0.62 mg/dL).   Medical History: Past Medical History:  Diagnosis Date  . Acute upper respiratory infection 07/06/2014  . Anemia   . Arthritis    Back   . Colon polyp    Tubular Adenoma   . Cough productive of clear sputum 06/22/2014  . Family history of breast cancer   . GERD (gastroesophageal reflux disease)   . History of right bundle branch block (RBBB)   . HOH (hard of hearing)   . Hypertension    had in the past, is no longer on medication for this and blood pressures are WNL  . 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 Primary localized osteoarthritis of right knee   . Uterine cancer ([place]) 06/23/2018    Assessment: 73 yo female with new DVT to re-start IV heparin 2 hrs after sheath pull from today's procedure per pharmacy dosing. Pt is S/P vascular surgery this afternoon (including stenting of L common external iliac vein, common femoral vein). Of note, pt was on Xarelto 20 mg once daily PTA for hx DVT (last dose 7/20 at 0900).  Baseline heparin level at 19:47 on 7/20 was >2.20 units/ml, indicating effect of Xarelto on heparin level. Pt's aPTT on 7/20 at 16:54 was 43 sec. Given hx of Xarelto PTA and elevated baseline heparin level on admission, will  adjust heparin dosing by aPTT initially.  7/22 AM update:  APTT therapeutic x 1 after re-start, Hgb down some post-op (watch)  Goal of Therapy:  Heparin level 0.3-0.7 units/ml Monitor platelets by anticoagulation protocol: Yes  aPTT goal: 66-102 sec  Plan -Cont heparin at 1000 units/hr -Confirmatory aPTT at [place], PharmD, [place] Pharmacist Phone: [phone]

## 2018-09-09 NOTE — Discharge Instructions (Addendum)
Skin Tear A skin tear is a wound in which the top layers of skin peel off. This is a common problem for older people. It can also be a problem for people who take certain medicines for too long. To repair the skin, your doctor may use:  Tape.  Skin tape (adhesive) strips. A bandage (dressing) may also be placed over the tape or skin tape strips. Follow these instructions at home: Wound care   Clean the wound as told by your doctor. You may be told to keep the wound dry for the first few days. If you are told to clean the wound: ? Wash the wound with mild soap and water, a wound cleanser, or a salt-water (saline) solution. ? If you use soap, rinse the wound with water to remove all soap. ? Do not rub the wound dry. Pat the wound gently, or let it air dry. ? Keep the bandage dry as told by your doctor.  Change any bandage as told by your doctor. This includes changing the bandage if it gets wet, gets dirty, or starts to smell bad. To change your bandage: ? Wash your hands with soap and water before and after you change your bandage. If you cannot use soap and water, use hand sanitizer. ? Leave tape or skin tape strips in place. They may need to stay in place for 2 weeks or longer. If tape strips get loose and curl up, you may trim the loose edges. Do not remove tape strips completely unless your doctor says it is okay.  Check your wound every day for signs of infection. Check for: ? Redness, swelling, or pain. ? More fluid or blood. ? Warmth. ? Pus or a bad smell.  Do not scratch or pick at the wound.  Protect the injured area until it has healed. Medicines  Take or apply over-the-counter and prescription medicines only as told by your doctor.  If you were prescribed an antibiotic medicine, take or apply it as told by your doctor. Do not stop using the antibiotic even if your condition gets better. General instructions   Keep the bandage dry as told by your doctor.  Do not  take baths, swim, use a hot tub, or do anything that puts your wound underwater until your doctor approves. Ask your doctor if you may take showers. You may only be allowed to take sponge baths.  Keep all follow-up visits as told by your doctor. This is important. Contact a doctor if:  You have redness, swelling, or pain around your wound.  You have more fluid or blood coming from your wound.  Your wound feels warm to the touch.  You have pus or a bad smell coming from your wound. Get help right away if:  You have a red streak that goes away from the skin tear.  You have a fever and chills, and your symptoms get worse all of a sudden. Summary  A skin tear is a wound in which the top layers of skin peel off.  To repair the skin, your doctor may use tape or skin tape strips.  Change any bandage as told by your doctor.  Take or apply over-the-counter and prescription medicines only as told by your doctor.  Contact a doctor if you have signs of infection. This information is not intended to replace advice given to you by your health care provider. Make sure you discuss any questions you have with your health care provider. Document Released:  11/14/2007 Document Revised: 05/28/2018 Document Reviewed: 11/25/2017 Elsevier Patient Education  [place].   Deep Skin Avulsion A deep skin avulsion is a type of open wound. It often results from a severe injury (trauma) that tears away all layers of the skin or an entire body part. The areas of the body that are most often affected include the face, lips, ears, nose, and fingers. A deep skin avulsion may make structures below the skin become visible. You may be able to see muscle, bone, nerves, and blood vessels. A deep skin avulsion can also damage important structures beneath the skin. These include bones, tendons, nerves, or blood vessels. What are the causes? This condition may be caused by an injury, such as:  Being  crushed.  Falling against a jagged surface.  An animal bite.  A gunshot wound.  A severe burn.  Being dragged, such as in a bicycle or motorcycle accident. What are the signs or symptoms? Symptoms of this condition include:  Pain.  Numbness.  Swelling.  Bleeding, which may be heavy. How is this diagnosed? This condition may be diagnosed with a medical history and physical exam. You may also have X-rays done. How is this treated? Treatment for this condition depends on how large and deep the wound is and where it is located. Treatment usually starts with:  Controlling the bleeding.  Washing out the wound with a germ-free (sterile) solution. After initial treatment, the wound may be closed or left open to heal.  Wounds that are small and clean may be closed with stitches (sutures).  Wounds that cannot be closed with sutures may be covered with a piece of skin (graft) or your own skin flap.  Wounds that are hard to close or that may become infected may be left open. These wounds heal over time from the inside out. You may also be treated with medicine, such as:  Antibiotic medicine.  Pain medicine.  Tetanus shot. Follow these instructions at home: Medicines  If you were prescribed an antibiotic medicine, take or apply it as told by your health care provider. Do not stop taking or using the antibiotic even if your condition improves.  Take over-the-counter and prescription medicines only as told by your health care provider.  If you were prescribed a pain medicine, take it 30 minutes or more before you do any wound care, or as told by your health care provider.  Do not drive or use heavy machinery while taking prescription pain medicine. Wound care   Follow instructions from your health care provider about how to take care of your wound. Make sure you: ? Wash your hands with soap and water before you change your bandage (dressing). If soap and water are not  available, use hand sanitizer. ? Change your dressing as told by your health care provider. ? Leave stitches (sutures), skin glue, or adhesive strips in place. These skin closures may need to stay in place for 2 weeks or longer. If adhesive strip edges start to loosen and curl up, you may trim the loose edges. Do not remove adhesive strips completely unless your health care provider tells you to do that.  Check your wound every day for signs of infection. Check for: ? Redness, swelling, or pain. ? Fluid or blood. ? Warmth. ? Pus or a bad smell.  Keep your dressing clean and dry. Do not take baths, swim, use a hot tub, or do anything that would put your wound under water until your  health care provider approves.  Clean the wound each day, or as told by your health care provider: ? Wash the wound with mild soap and water. ? Rinse the wound with water to remove all soap. ? Pat the wound dry with a clean towel. Do not rub it. ? Cover the wound with a clean dressing.  Do not scratch or pick at the wound. General instructions  Raise (elevate) the injured area above the level of your heart while you are sitting or lying down.  Do not use any products that contain nicotine or tobacco, such as cigarettes and e-cigarettes. These may delay wound healing. If you need help quitting, ask your health care provider.  Eat a healthy diet that includes foods rich in protein, vitamin A, and vitamin C to help your wound heal.  Keep all follow-up visits as told by your health care provider. This is important. Contact a health care provider if:  You have: ? Pain that does not get better with medicine. ? More redness, swelling, or pain around your wound. ? More fluid or blood coming from your wound. ? A fever.  You got a tetanus shot and you have swelling, severe pain, redness, or bleeding at the injection site.  You are nauseous or you vomit.  You notice something coming out of the wound, such as  wood or glass. Get help right away if:  You have a red streak going away from your wound.  A wound that was closed breaks open.  The wound is bleeding, and the bleeding does not stop with gentle pressure.  You have trouble breathing.  The wound is on your hand or foot and: ? You cannot properly move a finger or toe. ? Your fingers or toes look pale or bluish. Summary  A deep skin avulsion is a type of injury that can damage important structures beneath the skin.  A wound may be closed or left open to heal. This depends on the size and location of the wound and whether it is likely to become infected.  Follow instructions from your health care provider about how to take care of your wound.  Contact your health care provider if you have increased redness, swelling, or pain around your wound, or your pain is not controlled with medicine. This information is not intended to replace advice given to you by your health care provider. Make sure you discuss any questions you have with your health care provider. Document Released: 04/02/2006 Document Revised: 01/30/2017 Document Reviewed: 01/30/2017 Elsevier Patient Education  2020 [place] on my medicine - ELIQUIS (apixaban)  Why was Eliquis prescribed for you? Eliquis was prescribed to treat blood clots that may have been found in the veins of your legs (deep vein thrombosis) or in your lungs (pulmonary embolism) and to reduce the risk of them occurring again.  What do You need to know about Eliquis ? The starting dose is 10 mg (two 5 mg tablets) taken TWICE daily for the FIRST SEVEN (7) DAYS, then on (enter date)  09/17/2018  the dose is reduced to ONE 5 mg tablet taken TWICE daily.  Eliquis may be taken with or without food.   Try to take the dose about the same time in the morning and in the evening. If you have difficulty swallowing the tablet whole please discuss with your pharmacist how to take the medication  safely.  Take Eliquis exactly as prescribed and DO NOT stop taking Eliquis without talking to  the doctor who prescribed the medication.  Stopping may increase your risk of developing a new blood clot.  Refill your prescription before you run out.  After discharge, you should have regular check-up appointments with your healthcare provider that is prescribing your Eliquis.    What do you do if you miss a dose? If a dose of ELIQUIS is not taken at the scheduled time, take it as soon as possible on the same day and twice-daily administration should be resumed. The dose should not be doubled to make up for a missed dose.  Important Safety Information A possible side effect of Eliquis is bleeding. You should call your healthcare provider right away if you experience any of the following: ? Bleeding from an injury or your nose that does not stop. ? Unusual colored urine (red or dark brown) or unusual colored stools (red or black). ? Unusual bruising for unknown reasons. ? A serious fall or if you hit your head (even if there is no bleeding).  Some medicines may interact with Eliquis and might increase your risk of bleeding or clotting while on Eliquis. To help avoid this, consult your healthcare provider or pharmacist prior to using any new prescription or non-prescription medications, including herbals, vitamins, non-steroidal anti-inflammatory drugs (NSAIDs) and supplements.  This website has more information on Eliquis (apixaban): http://www.eliquis.com/eliquis/home

## 2018-09-09 NOTE — Progress Notes (Addendum)
PROGRESS NOTE  [name] HFW:[id number] DOB: June 04, 1945 DOA: 09/07/2018 PCP: Nolene Ebbs, MD  HPI/Recap of past 24 hours: [name] is a 73 y.o. female with known history of DVT of the left lower extremity status post stent placement by [name] and subsequently which was diagnosed with uterine cancer underwent radiation and planned chemotherapy followed by [name], oncologist has been compliant with her Xarelto noticed increasing swelling and wound on the left lower extremity with some weeping over the last few days.  Was recently treated for cellulitis of the same extremity.  Has been in increasing pain. LLE [place] positive for age undetermined DVT. On xarelto which was held due to procedure and started on hep drip. Post US guided cannulation left popliteal vein on 09/08/18.  09/09/18: Patient was seen and examined at her bedside this morning.  Left lower extremity pain is improved with current pain medications.  On heparin drip, will stop drip and switch to oral anticoagulation.  Discussed with [name], okay to start Eliquis, wound will need to be healed prior to restarting chemotherapy. Discussed with [name], wound is much improved, patient will follow-up with him outpatient post discharge, if wound care clinic referral is needed, it will be done outpatient.    Assessment/Plan: Principal Problem:   Left leg DVT (HCC) Active Problems:   Uterine cancer (HCC)   Pancytopenia, acquired ([place])   Acetabulum fracture (HCC)   Left lower extremity DVT post stent of left common and external iliac veins and common femoral vein complicated by stasis ulcer Was on Xarelto Known history of DVT LLL s/p stent. Evaluated by vascular surgery who opined possible occlusion of stent or recurrent DVT recommending venogram. 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 Heparin gtt initiated. To OR this afternoon. Of note, patient reports taking xarelto in am  but does not eat a large breakfast. ? Absorption if taking without food? Will switch to Eliquis POD #1 post ultrasound-guided cannulation left popliteal vein on 09/08/2018 Per vascular surgery, wound is healing well Patient will follow-up outpatient with [name]; if wound care clinic referral is needed, it will be done outpatient. Wound will need to be healed prior to restarting chemotherapy per [name] Optimize pain control  Uterine cancer Followed by [name] Left lower extremity wounds necessary to be healed prior to resuming chemotherapy Chemotherapy will delay wound healing  Hypokalemia Potassium 3.3 Repleted  Normocytic anemia/leukopenia Hemoglobin dropped from 8.2 to 7.3 this morning Likely in the setting of cancer treatment Will transfuse 1 unit PRBC Leukopenia improving from 2.8 yesterday to 3.3 this morning Continue to monitor   Code Status: full Family Communication: patient Disposition Plan: home when ready   Consultants:  Donzetta Duncan vascular surgery  Procedures:  Ultrasound-guidedcannulation of left popliteal vein  Antibiotics:    Objective: Vitals:   09/08/18 1700 09/08/18 1952 09/09/18 0526 09/09/18 0800  BP: 106/69 (!) 110/53 (!) 97/55 (!) 96/57  Pulse: 98 (!) 110 91 95  Resp: 15 16 15 15   Temp:  98.3 F (36.8 C) 97.9 F (36.6 C)   TempSrc:  Oral Oral   SpO2: 100% 100% 99% 100%  Weight:      Height:        Intake/Output Summary (Last 24 hours) at 09/09/2018 1021 Last data filed at 09/09/2018 0630 Gross per 24 hour  Intake 936.62 ml  Output 845 ml  Net 91.62 ml   Filed Weights   09/07/18 1156  Weight: 99.8 kg  Exam:  . General: 73 y.o. year-old female well developed well nourished in no acute distress.  Alert and oriented x3. . Cardiovascular: Regular rate and rhythm with no rubs or gallops.  No thyromegaly or JVD noted.   Marland Kitchen Respiratory: Clear to auscultation with no wheezes or rales. Good inspiratory effort. . Abdomen:  Soft nontender nondistended with normal bowel sounds x4 quadrants. . Musculoskeletal: No lower extremity edema. 2/4 pulses in all 4 extremities.  Left anterior chain wrapped in wound dressing. Marland Kitchen Psychiatry: Mood is appropriate for condition and setting   Data Reviewed: CBC: Recent Labs  Lab 09/07/18 1654 09/08/18 0238 09/09/18 0046  WBC 2.6* 2.8* 3.3*  NEUTROABS 1.2*  --   --   HGB 8.3* 8.2* 7.3*  HCT 27.4* 25.8* 23.2*  MCV 91.0 87.2 88.9  PLT 242 245 244   Basic Metabolic Panel: Recent Labs  Lab 09/07/18 1654 09/08/18 0238 09/09/18 0046  NA 136 137 138  K 3.3* 3.6 3.3*  CL 101 103 104  CO2 22 24 24   GLUCOSE 102* 106* 105*  BUN 10 7* 5*  CREATININE 0.64 0.66 0.62  CALCIUM 9.5 9.4 9.0   GFR: Estimated Creatinine Clearance: 67.8 mL/min (by C-G formula based on SCr of 0.62 mg/dL). Liver Function Tests: Recent Labs  Lab 09/07/18 1654  AST 19  ALT 18  ALKPHOS 73  BILITOT 0.7  PROT 8.1  ALBUMIN 3.6   No results for input(s): LIPASE, AMYLASE in the last 168 hours. No results for input(s): AMMONIA in the last 168 hours. Coagulation Profile: No results for input(s): INR, PROTIME in the last 168 hours. Cardiac Enzymes: No results for input(s): CKTOTAL, CKMB, CKMBINDEX, TROPONINI in the last 168 hours. BNP (last 3 results) No results for input(s): PROBNP in the last 8760 hours. HbA1C: No results for input(s): HGBA1C in the last 72 hours. CBG: No results for input(s): GLUCAP in the last 168 hours. Lipid Profile: No results for input(s): CHOL, HDL, LDLCALC, TRIG, CHOLHDL, LDLDIRECT in the last 72 hours. Thyroid Function Tests: No results for input(s): TSH, T4TOTAL, FREET4, T3FREE, THYROIDAB in the last 72 hours. Anemia Panel: No results for input(s): VITAMINB12, FOLATE, FERRITIN, TIBC, IRON, RETICCTPCT in the last 72 hours. Urine analysis:    Component Value Date/Time   COLORURINE YELLOW 06/22/2014 1040   APPEARANCEUR CLEAR 06/22/2014 1040   LABSPEC 1.016  06/22/2014 1040   PHURINE 6.5 06/22/2014 1040   GLUCOSEU NEGATIVE 06/22/2014 1040   HGBUR SMALL (A) 06/22/2014 1040   BILIRUBINUR NEGATIVE 06/22/2014 1040   KETONESUR NEGATIVE 06/22/2014 1040   PROTEINUR NEGATIVE 06/22/2014 1040   UROBILINOGEN 0.2 06/22/2014 1040   NITRITE NEGATIVE 06/22/2014 1040   LEUKOCYTESUR NEGATIVE 06/22/2014 1040   Sepsis Labs: @LABRCNTIP (procalcitonin:4,lacticidven:4)  ) Recent Results (from the past 240 hour(s))  SARS Coronavirus 2 (CEPHEID - Performed in [place] hospital lab), Hosp Order     Status: None   Collection Time: 09/07/18  6:42 PM   Specimen: Nasopharyngeal Swab  Result Value Ref Range Status   SARS Coronavirus 2 NEGATIVE NEGATIVE Final    Comment: (NOTE) If result is NEGATIVE SARS-CoV-2 target nucleic acids are NOT DETECTED. The SARS-CoV-2 RNA is generally detectable in upper and lower  respiratory specimens during the acute phase of infection. The lowest  concentration of SARS-CoV-2 viral copies this assay can detect is 250  copies / mL. A negative result does not preclude SARS-CoV-2 infection  and should not be used as the sole basis for treatment or other  patient  management decisions.  A negative result may occur with  improper specimen collection / handling, submission of specimen other  than nasopharyngeal swab, presence of viral mutation(s) within the  areas targeted by this assay, and inadequate number of viral copies  (<250 copies / mL). A negative result must be combined with clinical  observations, patient history, and epidemiological information. If result is POSITIVE SARS-CoV-2 target nucleic acids are DETECTED. The SARS-CoV-2 RNA is generally detectable in upper and lower  respiratory specimens dur ing the acute phase of infection.  Positive  results are indicative of active infection with SARS-CoV-2.  Clinical  correlation with patient history and other diagnostic information is  necessary to determine patient  infection status.  Positive results do  not rule out bacterial infection or co-infection with other viruses. If result is PRESUMPTIVE POSTIVE SARS-CoV-2 nucleic acids MAY BE PRESENT.   A presumptive positive result was obtained on the submitted specimen  and confirmed on repeat testing.  While 2019 novel coronavirus  (SARS-CoV-2) nucleic acids may be present in the submitted sample  additional confirmatory testing may be necessary for epidemiological  and / or clinical management purposes  to differentiate between  SARS-CoV-2 and other Sarbecovirus currently known to infect humans.  If clinically indicated additional testing with an alternate test  methodology [phone]) is advised. The SARS-CoV-2 RNA is generally  detectable in upper and lower respiratory sp ecimens during the acute  phase of infection. The expected result is Negative. Fact Sheet for Patients:  StrictlyIdeas.no Fact Sheet for Healthcare Providers: BankingDealers.co.za This test is not yet approved or cleared by the [place] FDA and has been authorized for detection and/or diagnosis of SARS-CoV-2 by FDA under an Emergency Use Authorization (EUA).  This EUA will remain in effect (meaning this test can be used) for the duration of the COVID-19 declaration under Section 564(b)(1) of the Act, 21 U.S.C. section 360bbb-3(b)(1), unless the authorization is terminated or revoked sooner. Performed at [facility], [place] [address], [place], [place] [address]   MRSA PCR Screening     Status: None   Collection Time: 09/08/18 12:34 AM   Specimen: Nasopharyngeal  Result Value Ref Range Status   MRSA by PCR NEGATIVE NEGATIVE Final    Comment:        The GeneXpert MRSA Assay (FDA approved for NASAL specimens only), is one component of a comprehensive MRSA colonization surveillance program. It is not intended to diagnose MRSA infection nor to guide  or monitor treatment for MRSA infections. Performed at [facility], [place] [address] [place],  [address]       Studies: No results found.  Scheduled Meds: . clopidogrel  75 mg Oral Q breakfast  . cycloSPORINE  1 drop Both Eyes BID  . feeding supplement (ENSURE ENLIVE)  237 mL Oral BID BM  . morphine  30 mg Oral Q12H  . potassium chloride SA  20 mEq Oral Daily  . sodium chloride flush  3 mL Intravenous Q12H    Continuous Infusions: . sodium chloride Stopped (09/08/18 1255)  . sodium chloride    . heparin 1,000 Units/hr (09/08/18 1730)     LOS: 1 day     Kayleen Memos, MD [facility] Pager [phone]  If 7PM-7AM, please contact night-coverage [email or web address] Password [facility] 09/09/2018, 10:21 AM

## 2018-09-09 NOTE — Progress Notes (Signed)
ANTICOAGULATION CONSULT NOTE - Initial Consult  Pharmacy Consult for Eliquis   Indication: DVT  No Known Allergies  Patient Measurements: Height: 5\' 1"  (154.9 cm) Weight: 220 lb (99.8 kg) IBW/kg (Calculated) : 47.8  Vital Signs: Temp: 97.9 F (36.6 C) (07/22 0526) Temp Source: Oral (07/22 0526) BP: 96/57 (07/22 0800) Pulse Rate: 95 (07/22 0800)  Labs: Recent Labs    09/07/18 1654 09/07/18 1947 09/08/18 0238 09/08/18 1856 09/09/18 0046  HGB 8.3*  --  8.2*  --  7.3*  HCT 27.4*  --  25.8*  --  23.2*  PLT 242  --  245  --  209  APTT 43*  --   --  94* 100*  HEPARINUNFRC  --  >2.20*  --   --  1.07*  CREATININE 0.64  --  0.66  --  0.62    Estimated Creatinine Clearance: 67.8 mL/min (by C-G formula based on SCr of 0.62 mg/dL).   Medical History: Past Medical History:  Diagnosis Date  . Acute upper respiratory infection 07/06/2014  . Anemia   . Arthritis    Back   . Colon polyp    Tubular Adenoma   . Cough productive of clear sputum 06/22/2014  . Family history of breast cancer   . GERD (gastroesophageal reflux disease)   . History of right bundle branch block (RBBB)   . HOH (hard of hearing)   . Hypertension    had in the past, is no longer on medication for this and blood pressures are WNL  . 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 Primary localized osteoarthritis of right knee   . Uterine cancer ([place]) 06/23/2018    Assessment: 73 year old female with has undergone left common external leg vein stenting. Presented with weeping leg wound and worsening leg pain. Patient has been taking Xarelto 20mg /day and occasionally use knee-high compression stockings. SCr, H&H low stable.  MD concerned with Xarelto failure - consulted pharmacy to dose Eliquis.   Goal of Therapy:  Monitor platelets by anticoagulation protocol: Yes   Plan:  Discontinue heparin gtt Start Eliquis 10mg  BID from 09/09/18-09/16/18. Start Eliquis 5mg  BID on 09/17/18.         Bethany Cumming L. Devin Going, PharmD, [place]  PGY1 Pharmacy Resident [employer]: [phone]  Mobile: [phone]  09/09/18 11:08 AM  Please check AMION for all [place] phone numbers After 10:00 PM, call the [place] [phone]

## 2018-09-09 NOTE — Progress Notes (Signed)
Patient refused PIV placement or Port-access. RN made aware and was told to put a consult in if Port-access is needed at any time.

## 2018-09-09 NOTE — Telephone Encounter (Signed)
Called and given below message. He verbalized understanding. 

## 2018-09-09 NOTE — Progress Notes (Addendum)
    Subjective  - Wants to go home as soon as she can.   Objective (!) 97/55 91 97.9 F (36.6 C) (Oral) 15 99%  Intake/Output Summary (Last 24 hours) at 09/09/2018 0816 Last data filed at 09/09/2018 0630 Gross per 24 hour  Intake 936.62 ml  Output 845 ml  Net 91.62 ml    Left LE with decreased edema. Popliteal stick site soft without hematoma Foot well perfused with intact active range of motion Lungs non labored breathing   Assessment/Planning: POD # 1  Procedure Performed: 1.  Ultrasound-guided cannulation left popliteal vein 2.  Intravascular ultrasound left femoral, common femoral, common external iliac veins and IVC 3.  Left lower extremity and central venography 4.  Mechanical thrombectomy of left common external iliac veins and left common femoral vein and femoral veins 5.  Stent of left common external iliac veins with 14 x 80mm Vici and stent of left external iliac vein and common femoral vein with 14 x 90 Wallstent 6.  Moderate sedation with fentanyl and Versed for 84 minutes  She did not tolerate the ace bandage secondary to pain.  She was on Xarelto and plavix prior to procedure and currently on Heparin and Plavix.  Will discuss discharge planning with Dr. Donzetta Matters.   I don't know if she will tolerate compression hose on the left or not.    [name] 09/09/2018 8:16 AM --  Laboratory Lab Results: Recent Labs    09/08/18 0238 09/09/18 0046  WBC 2.8* 3.3*  HGB 8.2* 7.3*  HCT 25.8* 23.2*  PLT 245 209   BMET Recent Labs    09/08/18 0238 09/09/18 0046  NA 137 138  K 3.6 3.3*  CL 103 104  CO2 24 24  GLUCOSE 106* 105*  BUN 7* 5*  CREATININE 0.66 0.62  CALCIUM 9.4 9.0    COAG Lab Results  Component Value Date   INR 1.3 (H) 06/29/2018   INR 1.4 (H) 06/18/2018   INR 1.4 (H) 06/17/2018   No results found for: PTT   I have interviewed and examined patient with PA and agree with assessment and plan above. Will defer anticoagulation  to primary team. Will fit compression stockings here and will hopefully be compliant. I have discussed with her husband.   Shamaya Kauer C. Donzetta Matters, MD [facility] Office: [phone] Pager: [phone]

## 2018-09-09 NOTE — Evaluation (Signed)
Physical Therapy Evaluation Patient Details Name: [name] MRN: [id number] DOB: 07-03-1945 Today's Date: 09/09/2018   History of Present Illness  Pt is a 73 y/o female admitted secondary to LLE DVT. Pt is s/p balloon angioplasty and mechanical thrombectomy of L common external iliac veins. PMH includes uterine cancer and pancytopenia.   Clinical Impression  Pt admitted secondary to problem above with deficits below. Pt reporting stiffness in LLE and mild discomfort, however, overall tolerated gait training well. Required min guard A for mobility tasks. HR ranged from mid 120s to mid 130s during ambulation. Pt reports children are available to assist as needed at d/c. Feel pt would benefit from HHPT at d/c. Will continue to follow acutely to maximize functional mobility independence and safety.     Follow Up Recommendations Home health PT;Supervision for mobility/OOB    Equipment Recommendations  None recommended by PT    Recommendations for Other Services       Precautions / Restrictions Precautions Precautions: Fall Restrictions Weight Bearing Restrictions: No      Mobility  Bed Mobility               General bed mobility comments: Sitting EOB upon entry   Transfers Overall transfer level: Needs assistance Equipment used: Rolling walker (2 wheeled) Transfers: Sit to/from Stand Sit to Stand: Min guard         General transfer comment: Min guard A for steadying. Demonstrated safe hand placement.   Ambulation/Gait Ambulation/Gait assistance: Min guard Gait Distance (Feet): 50 Feet Assistive device: Rolling walker (2 wheeled) Gait Pattern/deviations: Step-to pattern;Decreased step length - right;Decreased step length - left;Decreased weight shift to left;Antalgic Gait velocity: Decreased    General Gait Details: Slow, antalgic gait. Cues for sequencing using RW. Cues for upright posture and to relax shoulders when ambulating. HR ranging from mid 120s to mid  130s during ambulation.   Stairs            Wheelchair Mobility    Modified Rankin (Stroke Patients Only)       Balance Overall balance assessment: Needs assistance Sitting-balance support: No upper extremity supported;Feet supported Sitting balance-Leahy Scale: Good     Standing balance support: Bilateral upper extremity supported;During functional activity Standing balance-Leahy Scale: Poor Standing balance comment: Reliant on BUE support                              Pertinent Vitals/Pain Pain Assessment: Faces Faces Pain Scale: Hurts little more Pain Location: LLE  Pain Descriptors / Indicators: Aching;Operative site guarding Pain Intervention(s): Limited activity within patient's tolerance;Monitored during session;Repositioned    Home Living Family/patient expects to be discharged to:: Private residence Living Arrangements: Alone Available Help at Discharge: Family;Available 24 hours/day Type of Home: House Home Access: Ramped entrance     Home Layout: One level Home Equipment: Walker - 2 wheels;Cane - single point;Walker - 4 wheels;Shower seat Additional Comments: Pt reports children have been staying with her.     Prior Function Level of Independence: Independent with assistive device(s)         Comments: Reports using rollator      Hand Dominance        Extremity/Trunk Assessment   Upper Extremity Assessment Upper Extremity Assessment: Overall WFL for tasks assessed    Lower Extremity Assessment Lower Extremity Assessment: LLE deficits/detail LLE Deficits / Details: Swelling noted in LLE. Deficits consitent with post op pain and weakness.  Cervical / Trunk Assessment Cervical / Trunk Assessment: Normal  Communication   Communication: No difficulties  Cognition Arousal/Alertness: Awake/alert Behavior During Therapy: WFL for tasks assessed/performed Overall Cognitive Status: Within Functional Limits for tasks assessed                                         General Comments      Exercises     Assessment/Plan    PT Assessment Patient needs continued PT services  PT Problem List Decreased strength;Decreased balance;Decreased range of motion;Decreased mobility;Decreased knowledge of use of DME;Decreased knowledge of precautions;Pain       PT Treatment Interventions DME instruction;Gait training;Functional mobility training;Therapeutic activities;Therapeutic exercise;Balance training;Patient/family education    PT Goals (Current goals can be found in the Care Plan section)  Acute Rehab PT Goals Patient Stated Goal: to go home PT Goal Formulation: With patient Time For Goal Achievement: 09/23/18 Potential to Achieve Goals: Good    Frequency Min 3X/week   Barriers to discharge        Co-evaluation               AM-PAC PT "6 Clicks" Mobility  Outcome Measure Help needed turning from your back to your side while in a flat bed without using bedrails?: A Little Help needed moving from lying on your back to sitting on the side of a flat bed without using bedrails?: A Little Help needed moving to and from a bed to a chair (including a wheelchair)?: A Little Help needed standing up from a chair using your arms (e.g., wheelchair or bedside chair)?: A Little Help needed to walk in hospital room?: A Little Help needed climbing 3-5 steps with a railing? : A Lot 6 Click Score: 17    End of Session Equipment Utilized During Treatment: Gait belt Activity Tolerance: Patient tolerated treatment well Patient left: in bed;with call bell/phone within reach(sitting EOB ) Nurse Communication: Mobility status;Other (comment)(Pt wanting a bath) PT Visit Diagnosis: Other abnormalities of gait and mobility (R26.89);Muscle weakness (generalized) (M62.81);Pain Pain - Right/Left: Left Pain - part of body: Leg    Time: 6010-9323 PT Time Calculation (min) (ACUTE ONLY): 17 min   Charges:    PT Evaluation $PT Eval Moderate Complexity: 1 Mod          [name], PT, DPT  Acute Rehabilitation Services  Pager: [phone] Office: [phone]   Rudean Hitt 09/09/2018, 12:08 PM

## 2018-09-09 NOTE — Telephone Encounter (Signed)
-----   Message from [name], MD sent at 09/09/2018  7:41 AM EDT ----- Regarding: cancel appt Call her son. Since she is still in the hospital and she has chronic wound in her leg I have cancelled her chemo next week We will reschedule when her wound improves

## 2018-09-10 LAB — BASIC METABOLIC PANEL
Anion gap: 7 (ref 5–15)
BUN: 7 mg/dL — ABNORMAL LOW (ref 8–23)
CO2: 26 mmol/L (ref 22–32)
Calcium: 8.8 mg/dL — ABNORMAL LOW (ref 8.9–10.3)
Chloride: 105 mmol/L (ref 98–111)
Creatinine, Ser: 0.64 mg/dL (ref 0.44–1.00)
GFR calc Af Amer: >60 mL/min (ref >60)
GFR calc non Af Amer: >60 mL/min (ref >60)
Glucose, Bld: 104 mg/dL — ABNORMAL HIGH (ref 70–99)
Potassium: 4 mmol/L (ref 3.5–5.1)
Sodium: 138 mmol/L (ref 135–145)

## 2018-09-10 LAB — CBC
HCT: 22.1 % — ABNORMAL LOW (ref 36.0–46.0)
Hemoglobin: 6.8 g/dL — CL (ref 12.0–15.0)
MCH: 28 pg (ref 26.0–34.0)
MCHC: 30.8 g/dL (ref 30.0–36.0)
MCV: 90.9 fL (ref 80.0–100.0)
Platelets: 196 10*3/uL (ref 150–400)
RBC: 2.43 MIL/uL — ABNORMAL LOW (ref 3.87–5.11)
RDW: 18.9 % — ABNORMAL HIGH (ref 11.5–15.5)
WBC: 3.4 10*3/uL — ABNORMAL LOW (ref 4.0–10.5)
nRBC: 0 % (ref 0.0–0.2)

## 2018-09-10 LAB — PREPARE RBC (CROSSMATCH)

## 2018-09-10 LAB — HEMOGLOBIN AND HEMATOCRIT, BLOOD
HCT: 25.2 % — ABNORMAL LOW (ref 36.0–46.0)
Hemoglobin: 8 g/dL — ABNORMAL LOW (ref 12.0–15.0)

## 2018-09-10 MED ORDER — SODIUM CHLORIDE 0.9% IV SOLUTION: Freq: Once | INTRAVENOUS | Status: DC

## 2018-09-10 MED ORDER — HEPARIN SOD (PORK) LOCK FLUSH 100 UNIT/ML IV SOLN
500.0000 [IU] | INTRAVENOUS | Status: AC | PRN
  Administered 2018-09-10: 500 [IU]

## 2018-09-10 MED ORDER — ELIQUIS 5 MG VTE STARTER PACK: ORAL_TABLET | ORAL | 0 refills | Status: DC

## 2018-09-10 MED ORDER — APIXABAN (ELIQUIS) EDUCATION KIT FOR DVT/PE PATIENTS: 1.0000 | PACK | Freq: Once | 0 refills | Status: AC

## 2018-09-10 MED ORDER — CLOPIDOGREL BISULFATE 75 MG PO TABS: 75.0000 mg | ORAL_TABLET | Freq: Every day | ORAL | 1 refills | Status: DC

## 2018-09-10 MED ORDER — SODIUM CHLORIDE 0.9% FLUSH: 10.0000 mL | INTRAVENOUS | Status: DC | PRN

## 2018-09-10 NOTE — TOC Transition Note (Signed)
Transition of Care [facility]) - CM/SW Discharge Note Marvetta Gibbons RN, BSN Transitions of Care Unit 4E- RN Case Manager [phone]   Patient Details  Name: RAVIN BENDALL MRN: [id number] Date of Birth: 10-Jun-1945  Transition of [facility]) CM/SW Contact:  Dawayne Patricia, RN Phone Number: 09/10/2018, 2:08 PM   Clinical Narrative:    Pt stable for transition home, orders for HHRN/PT placed- CM met with pt at bedside- list provided for choice Per CMS guidelines from medicare.gov website with star ratings (copy placed in shadow chart)- per pt she has used [facility] in past but if unable to take would like to try [facility]. Pt has been started on Eliquis- 30 day free card provided to pt to use at [facility]. Call made to [facility] with [facility] for [facility]- however unable to accept due to low staffing at this time- Call made to [facility] with [facility] who is able to accept referral for HHRN/PT   Final next level of care: [place] Barriers to Discharge: No Barriers Identified   Patient Goals and CMS Choice Patient states their goals for this hospitalization and ongoing recovery are:: ready to go home CMS Medicare.gov Compare Post Acute Care list provided to:: Patient Choice offered to / list presented to : Patient  Discharge Placement   Home with [employer]                     Discharge Plan and Services   Discharge Planning Services: CM Consult Post Acute Care Choice: Home Health          DME Arranged: N/A DME Agency: NA       HH Arranged: PT, RN [place] Agency: [facility] Date [place]: 09/10/18 Time [place]: [id number] Representative spoke with at [place]: [place] ([place]) Interventions     Readmission Risk Interventions Readmission Risk Prevention Plan 09/10/2018  Transportation Screening Complete  PCP or Specialist Appt within 3-5 Days Complete  HRI or [place] Complete  Social Work Consult for [place]  Planning/Counseling Complete  Palliative Care Screening Not Applicable  Medication Review [employer]) Complete  Some recent data might be hidden

## 2018-09-10 NOTE — Progress Notes (Signed)
Placed order for IV team to put in a new IV since pt's was removed earlier today.  Pt refused stating she did not understand why she needed another IV placed.  RN did explain that it's preferred for pt's to have at least one IV access site before they leave incase they need quick medicine.  The pt still refused but said if she need an IV medicine she would allow IV team to access her Port-a-cath site.  Lupita Dawn, RN

## 2018-09-10 NOTE — Discharge Summary (Addendum)
Discharge Summary  [name] BJY:[id number] DOB: 01-07-1946  PCP: Nolene Ebbs, MD  Admit date: 09/07/2018 Discharge date: 09/10/2018  Time spent: 35 minutes  Recommendations for Outpatient Follow-up:  1. Follow-up with vascular surgery 2. Follow-up with oncology 3. Follow-up with your primary care provider 4. Continue physical therapy 5. Fall precautions  Discharge Diagnoses:  Active Hospital Problems   Diagnosis Date Noted  . Left leg DVT ([place]) 09/07/2018  . Acetabulum fracture ([place]) 09/08/2018  . Pancytopenia, acquired ([place]) 07/21/2018  . Uterine cancer ([place]) 06/23/2018    Resolved Hospital Problems  No resolved problems to display.    Discharge Condition: Stable  Diet recommendation: Resume previous diet  Vitals:   09/10/18 1001 09/10/18 1300  BP: (!) 100/52 108/71  Pulse:    Resp: (!) 21 19  Temp: 97.9 F (36.6 C) 98.5 F (36.9 C)  SpO2:      History of present illness:   [name] [place] a 73 y.o.femalewithknown history of DVT of the left lower extremity status post stent placement by Dr. Donzetta Matters and subsequently which was diagnosed with uterine cancer underwent radiation and planned chemotherapy followed by Dr. Rozetta Nunnery, oncologist has been compliant with her Xarelto noticed increasing swelling and wound on the left lower extremity with some weeping over the last few days. Was recently treated for cellulitis of the same extremity. Has been in increasing pain. LLE [place] positive for age undetermined DVT. On xarelto which was held due to procedure and started on hep drip. Post US guided cannulation left popliteal vein on 09/08/18.  09/09/18: Patient was seen and examined at her bedside this morning.  Left lower extremity pain is improved with current pain medications.  On heparin drip, will stop drip and switch to oral anticoagulation.  Discussed with Dr. Alvy Bimler, okay to start Eliquis, wound will need to be healed prior to restarting chemotherapy. Discussed  with Dr. Donzetta Matters, wound is much improved, patient will follow-up with him outpatient post discharge, if wound care clinic referral is needed, it will be done outpatient.    09/10/18: Patient was seen and examined at her bedside this morning.  She has no new complaints.  Hemoglobin dropped 6.8 from 7.3 this morning.  1 unit PRBC ordered to be transfused.  She denies chest pain, dizziness, palpitations.  She has no new complaints.  On the day of discharge, the patient was hemodynamically stable.  She will need to follow-up with vascular surgery Dr. Donzetta Matters and her oncologist Dr. Alvy Bimler.  Patient understands and agrees to plan.  Hospital Course:  Principal Problem:   Left leg DVT ([place]) Active Problems:   Uterine cancer (HCC)   Pancytopenia, acquired ([place])   Acetabulum fracture (H[facility])  Left lower extremity DVT post stent of left common and external iliac veins and common femoral vein complicated by stasis ulcer Was on Xarelto, Dced and started on Eliquis on 09/09/18 Known history of DVT LLE s/p stent. Evaluated by vascular surgery who opined possible occlusion of stent or recurrent DVT recommending venogram. 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 Heparin gtt initiated. To OR this afternoon. Of note, patient reports taking xarelto in am but does not eata largebreakfast. ? Absorption if taking without food? POD #2 post ultrasound-guided cannulation left popliteal vein on 09/08/2018 Per vascular surgery, wound is healing well Patient will follow-up outpatient with Dr. Donzetta Matters; if wound care clinic referral is needed, it will be done outpatient. Wound will need to be healed prior to restarting chemotherapy per Dr.  Gorsuch Follow-up with vascular surgery and oncology outpatient Continue Plavix 75 mg daily as recommended by vascular surgery and Eliquis.  Uterine cancer Followed by Dr. Alvy Bimler Left lower extremity wounds necessary to be healed prior to resuming  chemotherapy Chemotherapy will delay wound healing  Resolved hypokalemia post repletion.  Potassium 4.0 on 09/10/2018  Chronic normocytic anemia/leukopenia in the setting of recent chemotherapy Hemoglobin dropped from 8.2 to 7.3 >> 6.8 Transfused 1 unit PRBC on 09/10/2018 Repeat H&H 8.0 post 1U PRBC  WBC 3.4 on 09/10/2018 Follow-up with oncology post hospitalization Repeat CBC on Monday 09/14/18.     Consultants:  Vascular surgery  Procedures:  Ultrasound-guidedcannulation of left popliteal vein  Antibiotics: None  Discharge Exam: BP 108/71   Pulse (!) 105   Temp 98.5 F (36.9 C) (Oral)   Resp 19   Ht '5\' 1"'  (1.549 m)   Wt 99.8 kg   SpO2 100%   BMI 41.57 kg/m  . General: 73 y.o. year-old female well developed well nourished in no acute distress.  Alert and oriented x3. . Cardiovascular: Regular rate and rhythm with no rubs or gallops.  No thyromegaly or JVD noted.   Marland Kitchen Respiratory: Clear to auscultation with no wheezes or rales. Good inspiratory effort. . Abdomen: Soft nontender nondistended with normal bowel sounds x4 quadrants. . Musculoskeletal: No lower extremity edema. 2/4 pulses in all 4 extremities.  Wound dressing on left anterior shin. Marland Kitchen Psychiatry: Mood is appropriate for condition and setting  Discharge Instructions You were cared for by a hospitalist during your hospital stay. If you have any questions about your discharge medications or the care you received while you were in the hospital after you are discharged, you can call the unit and asked to speak with the hospitalist on call if the hospitalist that took care of you is not available. Once you are discharged, your primary care physician will handle any further medical issues. Please note that NO REFILLS for any discharge medications will be authorized once you are discharged, as it is imperative that you return to your primary care physician (or establish a relationship with a primary care  physician if you do not have one) for your aftercare needs so that they can reassess your need for medications and monitor your lab values.   Allergies as of 09/10/2018   No Known Allergies     Medication List    STOP taking these medications   cycloSPORINE 0.05 % ophthalmic emulsion Commonly known as: RESTASIS   lansoprazole 30 MG capsule Commonly known as: PREVACID   lidocaine-prilocaine cream Commonly known as: EMLA   mupirocin ointment 2 % Commonly known as: Bactroban   ondansetron 4 MG disintegrating tablet Commonly known as: Zofran ODT   ondansetron 8 MG tablet Commonly known as: Zofran   polyethylene glycol 17 g packet Commonly known as: MIRALAX / GLYCOLAX   potassium chloride SA 20 MEQ tablet Commonly known as: K-DUR   prochlorperazine 10 MG tablet Commonly known as: COMPAZINE   rivaroxaban 20 MG Tabs tablet Commonly known as: XARELTO     TAKE these medications   apixaban Kit Commonly known as: ELIQUIS 1 kit by Does not apply route once for 1 dose.   clopidogrel 75 MG tablet Commonly known as: PLAVIX Take 1 tablet (75 mg total) by mouth daily with breakfast.   dexamethasone 4 MG tablet Commonly known as: DECADRON Take 2 tabs at the night before and 2 tabs the morning of chemotherapy, every 3 weeks, by mouth What  changed:   how much to take  how to take this  when to take this  additional instructions   Eliquis DVT/PE Starter Pack 5 MG Tabs Take as directed on package: start with two-4m tablets twice daily for 7 days. On day 8, switch to one-532mtablet twice daily. Notes to patient: Start taking 1 32m58mab on 7/31   morphine 15 MG tablet Commonly known as: MSIR Take 1 tablet (15 mg total) by mouth every 6 (six) hours as needed for severe pain. What changed: Another medication with the same name was changed. Make sure you understand how and when to take each.   morphine 30 MG 12 hr tablet Commonly known as: MS CONTIN Take 1 tablet (30 mg  total) by mouth every 12 (twelve) hours. What changed:   when to take this  reasons to take this   Olopatadine HCl 0.2 % Soln Place 1 drop into both eyes daily.      No Known Allergies Follow-up Information    CaiWaynetta SandyD Follow up in 2 week(s).   Specialties: Vascular Surgery, Cardiology Why: office will call Contact information: [phone]        GorHeath LarkD. Call in 1 day(s).   Specialty: Hematology and Oncology Why: Please call for post hospital follow-up appointment. Contact information: [phone]     AvbNolene EbbsD. Call in 1 day(s).   Specialty: Internal Medicine Why: Please call for a post hospital follow-up appointment. Contact information: [id number] [address] [place] Follow up.   Specialty: [place]: HHRN/PT arranged- they will call you for start of care appointment Contact information: [address] [phone]         The results of significant diagnostics from this hospitalization (including imaging, microbiology, ancillary and laboratory) are listed below for reference.    Significant Diagnostic Studies: Ct Venogram Abd/pel  Result Date: 09/08/2018 CLINICAL DATA:  Leg swelling, pain. History of uterine cancer post XRT, currently on chemotherapy for left pelvic mass, on Xarelto EXAM: CTA ABDOMEN AND PELVIS WITH CONTRAST TECHNIQUE: Multidetector CT imaging of the abdomen and pelvis was performed using the standard protocol during bolus administration of intravenous contrast. Multiplanar reconstructed images and MIPs were obtained and reviewed to evaluate the vascular anatomy. CONTRAST:  [id number] IOHEXOL 350 MG/ML SOLN COMPARISON:  PET-CT 07/02/2018 and previous FINDINGS: VASCULAR Aorta: Normal  caliber aorta without aneurysm, dissection, vasculitis or significant stenosis. Celiac: Patent without evidence of aneurysm, dissection, vasculitis or significant stenosis. SMA: Patent without evidence of aneurysm, dissection, vasculitis or significant stenosis. Renals: Both renal arteries are patent without evidence of aneurysm, dissection, vasculitis, fibromuscular dysplasia or significant stenosis. IMA: Patent without evidence of aneurysm, dissection, vasculitis or significant stenosis. Inflow: Patent without evidence of aneurysm, dissection, vasculitis or significant stenosis. Proximal Outflow: Bilateral common femoral and visualized portions of the superficial and profunda femoral arteries are patent without evidence of aneurysm, dissection, vasculitis or significant stenosis. Veins: On the left, incompletely occlusive DVT in the common femoral vein extending across the saphenofemoral junction. Long segment chronic occlusion of the left external vein. The left common/external iliac venous stent is chronically occluded. Nonenhancement of left internal iliac branches. Continued patency of left common iliac vein centrally. Multiple enlarged body wall venous collaterals provide  drainage of the left lower extremity. On the right, patent visualized deep venous system of the right lower extremity. Iliac venous system unremarkable. IVC patent. Patent bilateral renal veins. Patent superior mesenteric and splenic veins. Patent somewhat diminutive portal vein. Patent hepatic veins. Review of the MIP images confirms the above findings. NON-VASCULAR Lower chest: No pleural or pericardial effusion. Heart size normal. Hepatobiliary: No focal liver abnormality is seen. Status post cholecystectomy. No biliary dilatation. Pancreas: Unremarkable. No pancreatic ductal dilatation or surrounding inflammatory changes. Spleen: Normal in size without focal abnormality. Adrenals/Urinary Tract: Mild adrenal hypertrophy. No renal mass or  hydronephrosis. Urinary bladder nondistended. Stomach/Bowel: Stomach is decompressed. Small bowel is nondistended. Normal appendix. Colon is nondilated with innumerable scattered diverticula, but no significant adjacent inflammatory/edematous change or abscess. Lymphatic: No abdominal or pelvic adenopathy. Reproductive: Status post hysterectomy. No adnexal masses. Other: Left pelvic sidewall mass 8.1 x 3.9 cm maximum transverse dimensions (previously 8.7 x 4.6) with probable involvement of the medial wall of the left acetabulum, and pathologic fracture of ischium as before. Musculoskeletal: Pathologic fracture of the ischium, medial and superior acetabulum secondary to permeative lesion presumably metastasis as before. 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 Spondylitic changes throughout the lumbar spine. No new fracture or worrisome bone lesion. IMPRESSION: 1. 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 2. Slight decrease in size of left pelvic sidewall mass. 3. 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 4. Colonic diverticulosis. Electronically Signed   By: Lucrezia Europe M.D.   On: 09/08/2018 08:09   Vas [place] Lower Extremity Venous (dvt)  Result Date: 09/07/2018  Lower Venous Study Indications: Edema. Other Indications: Hx iliac DVT and iliac stent placement. Limitations: Body habitus and poor ultrasound/tissue interface. Comparison Study: 04/12/18 negative Performing Technologist: June Leap RDMS, RVT  Examination Guidelines: A complete evaluation includes B-mode imaging, spectral Doppler, color Doppler, and power Doppler as needed of all accessible portions of each vessel. Bilateral testing is considered an integral part of a complete examination. Limited examinations for reoccurring indications may be performed as noted.  +---------+---------------+---------+-----------+----------+--------------+ LEFT      CompressibilityPhasicitySpontaneityPropertiesSummary        +---------+---------------+---------+-----------+----------+--------------+ CFV      Partial        Yes      Yes                                 +---------+---------------+---------+-----------+----------+--------------+ SFJ      Partial        Yes      Yes                                 +---------+---------------+---------+-----------+----------+--------------+ FV Prox  Partial        Yes      Yes                                 +---------+---------------+---------+-----------+----------+--------------+ FV Mid   Full                                                        +---------+---------------+---------+-----------+----------+--------------+ FV DistalFull                                                        +---------+---------------+---------+-----------+----------+--------------+  POP                     Yes      Yes                                 +---------+---------------+---------+-----------+----------+--------------+ PTV                                                   Not visualized +---------+---------------+---------+-----------+----------+--------------+ PERO                                                  Not visualized +---------+---------------+---------+-----------+----------+--------------+   Left Technical Findings: Technically limited study. Unable to visualize more proximally into iliac veins.   Summary: Left: Findings consistent with age indeterminate deep vein thrombosis involving the left common femoral vein, and left proximal femoral vein.  *See table(s) above for measurements and observations. Electronically signed by Servando Snare MD on 09/07/2018 at 9:41:52 PM.    Final     Microbiology: Recent Results (from the past 240 hour(s))  SARS Coronavirus 2 (CEPHEID - Performed in [facility] hospital lab), Hosp Order     Status: None   Collection Time: 09/07/18   6:42 PM   Specimen: Nasopharyngeal Swab  Result Value Ref Range Status   SARS Coronavirus 2 NEGATIVE NEGATIVE Final    Comment: (NOTE) If result is NEGATIVE SARS-CoV-2 target nucleic acids are NOT DETECTED. The SARS-CoV-2 RNA is generally detectable in upper and lower  respiratory specimens during the acute phase of infection. The lowest  concentration of SARS-CoV-2 viral copies this assay can detect is 250  copies / mL. A negative result does not preclude SARS-CoV-2 infection  and should not be used as the sole basis for treatment or other  patient management decisions.  A negative result may occur with  improper specimen collection / handling, submission of specimen other  than nasopharyngeal swab, presence of viral mutation(s) within the  areas targeted by this assay, and inadequate number of viral copies  (<250 copies / mL). A negative result must be combined with clinical  observations, patient history, and epidemiological information. If result is POSITIVE SARS-CoV-2 target nucleic acids are DETECTED. The SARS-CoV-2 RNA is generally detectable in upper and lower  respiratory specimens dur ing the acute phase of infection.  Positive  results are indicative of active infection with SARS-CoV-2.  Clinical  correlation with patient history and other diagnostic information is  necessary to determine patient infection status.  Positive results do  not rule out bacterial infection or co-infection with other viruses. If result is PRESUMPTIVE POSTIVE SARS-CoV-2 nucleic acids MAY BE PRESENT.   A presumptive positive result was obtained on the submitted specimen  and confirmed on repeat testing.  While 2019 novel coronavirus  (SARS-CoV-2) nucleic acids may be present in the submitted sample  additional confirmatory testing may be necessary for epidemiological  and / or clinical management purposes  to differentiate between  SARS-CoV-2 and other Sarbecovirus currently known to infect  humans.  If clinically indicated additional testing with an alternate test  methodology [phone]) is advised. The SARS-CoV-2 RNA is generally  detectable in upper and lower respiratory sp ecimens during the acute  phase of infection. The expected result is Negative. Fact Sheet for Patients:  StrictlyIdeas.no Fact Sheet for Healthcare Providers: BankingDealers.co.za This test is not yet approved or cleared by the [place] FDA and has been authorized for detection and/or diagnosis of SARS-CoV-2 by FDA under an Emergency Use Authorization (EUA).  This EUA will remain in effect (meaning this test can be used) for the duration of the COVID-19 declaration under Section 564(b)(1) of the Act, 21 U.S.C. section 360bbb-3(b)(1), unless the authorization is terminated or revoked sooner. Performed at [facility], [place] [address] [place], [place] [address]   MRSA PCR Screening     Status: None   Collection Time: 09/08/18 12:34 AM   Specimen: Nasopharyngeal  Result Value Ref Range Status   MRSA by PCR NEGATIVE NEGATIVE Final    Comment:        The GeneXpert MRSA Assay (FDA approved for NASAL specimens only), is one component of a comprehensive MRSA colonization surveillance program. It is not intended to diagnose MRSA infection nor to guide or monitor treatment for MRSA infections. Performed at [facility], [place] [address] [place], [place] [address]      Labs: Basic Metabolic Panel: Recent Labs  Lab 09/07/18 1654 09/08/18 0238 09/09/18 0046 09/10/18 0633  NA 136 137 138 138  K 3.3* 3.6 3.3* 4.0  CL 101 103 104 105  CO2 '22 24 24 26  ' GLUCOSE 102* 106* 105* 104*  BUN 10 7* 5* 7*  CREATININE 0.64 0.66 0.62 0.64  CALCIUM 9.5 9.4 9.0 8.8*   Liver Function Tests: Recent Labs  Lab 09/07/18 1654  AST 19  ALT 18  ALKPHOS 73  BILITOT 0.7  PROT 8.1  ALBUMIN 3.6   No results for input(s): LIPASE,  AMYLASE in the last 168 hours. No results for input(s): AMMONIA in the last 168 hours. CBC: Recent Labs  Lab 09/07/18 1654 09/08/18 0238 09/09/18 0046 09/10/18 0633 09/10/18 1426  WBC 2.6* 2.8* 3.3* 3.4*  --   NEUTROABS 1.2*  --   --   --   --   HGB 8.3* 8.2* 7.3* 6.8* 8.0*  HCT 27.4* 25.8* 23.2* 22.1* 25.2*  MCV 91.0 87.2 88.9 90.9  --   PLT 242 245 209 196  --    Cardiac Enzymes: No results for input(s): CKTOTAL, CKMB, CKMBINDEX, TROPONINI in the last 168 hours. BNP: BNP (last 3 results) No results for input(s): BNP in the last 8760 hours.  ProBNP (last 3 results) No results for input(s): PROBNP in the last 8760 hours.  CBG: No results for input(s): GLUCAP in the last 168 hours.     Signed:  Kayleen Memos, MD [facility] 09/10/2018, 2:51 PM

## 2018-09-10 NOTE — Progress Notes (Addendum)
[facility]  Subjective  - Doing well and pleased with left leg decreased edema.   Objective 107/62 (!) 105 99 F (37.2 C) (Oral) 14 100%  Intake/Output Summary (Last 24 hours) at 09/10/2018 0738 Last data filed at 09/10/2018 0230 Gross per 24 hour  Intake 120 ml  Output 400 ml  Net -280 ml    Left foot warm and well perfused. Compression garment being placed by RN. Lungs non labored breathing Gen NAD   Assessment/Planning: POD # 2  Procedure Performed: 1.Ultrasound-guided cannulation left popliteal vein 2.Intravascular ultrasound left femoral, common femoral, common external iliac veins and IVC 3.Left lower extremity and central venography 4.Mechanical thrombectomy of left common external iliac veins and left common femoral vein and femoral veins 5.Stent of left common external iliac veins with 14 x 68mm V[place]and stent of left external iliac vein and common femoral vein with 14 x 90 Wallstent 6.Moderate sedation with fentanyl and Versed for 84 minutes  HGB 6.8 asymptomatic plan to receive PRBC prior to D/C today. F/U with Dr. Donzetta Matters with venous duplex in 2-3 weeks Compression garment daily, may remove at night.   Roxy Horseman 09/10/2018 7:38 AM --  Laboratory Lab Results: Recent Labs    09/09/18 0046 09/10/18 0633  WBC 3.3* 3.4*  HGB 7.3* 6.8*  HCT 23.2* 22.1*  PLT 209 196   BMET Recent Labs    09/08/18 0238 09/09/18 0046  NA 137 138  K 3.6 3.3*  CL 103 104  CO2 24 24  GLUCOSE 106* 105*  BUN 7* 5*  CREATININE 0.66 0.62  CALCIUM 9.4 9.0    COAG Lab Results  Component Value Date   INR 1.3 (H) 06/29/2018   INR 1.4 (H) 06/18/2018   INR 1.4 (H) 06/17/2018   No results found for: PTT   I have independently interviewed and examined patient and agree with PA assessment and plan above.   Aretha Levi C. Donzetta Matters, MD [facility] Office: [phone] Pager:  [phone]

## 2018-09-11 ENCOUNTER — Telehealth: Payer: Self-pay

## 2018-09-11 LAB — TYPE AND SCREEN
ABO/RH(D): B POS
Antibody Screen: NEGATIVE
Unit division: 0

## 2018-09-11 LAB — BPAM RBC
Blood Product Expiration Date: 202008142359
ISSUE DATE / TIME: 202007230934
Unit Type and Rh: 7300

## 2018-09-11 NOTE — Telephone Encounter (Signed)
She called to verify CT scan and appts canceled with Dr. Alvy Bimler. Told her yes appointments canceled and office will reschedule when wound improves. She verbalized understanding.

## 2018-09-14 ENCOUNTER — Ambulatory Visit (HOSPITAL_COMMUNITY): Admission: RE | Admit: 2018-09-14 | Payer: Medicare Other | Source: Ambulatory Visit

## 2018-09-15 ENCOUNTER — Inpatient Hospital Stay: Payer: Medicare Other

## 2018-09-15 ENCOUNTER — Inpatient Hospital Stay: Payer: Medicare Other | Admitting: Hematology and Oncology

## 2018-09-15 ENCOUNTER — Encounter (HOSPITAL_COMMUNITY): Payer: Self-pay | Admitting: Vascular Surgery

## 2018-09-16 ENCOUNTER — Telehealth: Payer: Self-pay | Admitting: *Deleted

## 2018-09-16 NOTE — Telephone Encounter (Signed)
Per son: Patient has been getting wound care at home. She has been experiencing some pain in her side.  The nurse from wound care says the swelling caused some muscle strain in the upper leg. She has a difficult time ambulating at home. Her daughter in law and grand children check in on her frequently. Darrell is back to work and has not been able to go over to her house as much.   Telephone call to patient to check status: Patient reports she is still sore. She is concerned the wound is not healing. It is leaking clear with some blood. She reports the nurse came Sunday and will be back twice weekly. She is also getting home PT. She says she understands her treatment will be delayed until this wound is healed. She says she is following all the directions. She gets a few walks in a day. She has a follow up next month. She says she has had some bad days but she has noticed improvement. She will contact us if she has any concerns or questions.

## 2018-09-16 NOTE — Telephone Encounter (Signed)
-----   Message from [name], MD sent at 09/15/2018  8:39 AM EDT ----- Regarding: call her son Can you call her son and check on how her leg is doing? Is the leg ulcer/cellulitis improving? Is she getting wound care? I cannot bring her back yet until the leg wound is almost healed before we can restart chemo. I can also discuss alternative treatment with her and her family when she is ready

## 2018-09-17 ENCOUNTER — Telehealth: Payer: Self-pay | Admitting: *Deleted

## 2018-09-17 ENCOUNTER — Other Ambulatory Visit: Payer: Self-pay | Admitting: Hematology and Oncology

## 2018-09-17 DIAGNOSIS — L03116 Cellulitis of left lower limb: Secondary | ICD-10-CM

## 2018-09-17 NOTE — Telephone Encounter (Signed)
Telephone call to patient- she states she is having a great day today. She feels the best she has in a while. Wound Care Clinic appt info given to patient. She confirms 8/13 at 1:15pm. Address and phone number were given to the patient.

## 2018-09-17 NOTE — Telephone Encounter (Signed)
Yes anything to help speed up the process for the healing they agree with. I will call them to set up appointment.

## 2018-09-17 NOTE — Telephone Encounter (Signed)
I tried to put in the order I think you might have to call

## 2018-09-17 NOTE — Telephone Encounter (Signed)
When she was in the hospital, I suggested to hospitalist to recommend wound care consult If her son agrees, we can send referral to wound care clinic

## 2018-09-17 NOTE — Telephone Encounter (Signed)
Telephone call to [facility] [phone] option 4- left message with referral dept. For return call.

## 2018-09-30 ENCOUNTER — Other Ambulatory Visit: Payer: Self-pay

## 2018-09-30 DIAGNOSIS — I82422 Acute embolism and thrombosis of left iliac vein: Secondary | ICD-10-CM

## 2018-10-01 ENCOUNTER — Encounter (HOSPITAL_BASED_OUTPATIENT_CLINIC_OR_DEPARTMENT_OTHER): Payer: Medicare Other | Attending: Internal Medicine

## 2018-10-01 ENCOUNTER — Other Ambulatory Visit: Payer: Self-pay

## 2018-10-01 DIAGNOSIS — Z95820 Peripheral vascular angioplasty status with implants and grafts: Secondary | ICD-10-CM | POA: Insufficient documentation

## 2018-10-01 DIAGNOSIS — Z923 Personal history of irradiation: Secondary | ICD-10-CM | POA: Insufficient documentation

## 2018-10-01 DIAGNOSIS — Z7901 Long term (current) use of anticoagulants: Secondary | ICD-10-CM | POA: Insufficient documentation

## 2018-10-01 DIAGNOSIS — C55 Malignant neoplasm of uterus, part unspecified: Secondary | ICD-10-CM | POA: Insufficient documentation

## 2018-10-01 DIAGNOSIS — Z86718 Personal history of other venous thrombosis and embolism: Secondary | ICD-10-CM | POA: Insufficient documentation

## 2018-10-01 DIAGNOSIS — L97822 Non-pressure chronic ulcer of other part of left lower leg with fat layer exposed: Secondary | ICD-10-CM | POA: Insufficient documentation

## 2018-10-01 DIAGNOSIS — I890 Lymphedema, not elsewhere classified: Secondary | ICD-10-CM | POA: Insufficient documentation

## 2018-10-01 DIAGNOSIS — Z9221 Personal history of antineoplastic chemotherapy: Secondary | ICD-10-CM | POA: Insufficient documentation

## 2018-10-01 DIAGNOSIS — I872 Venous insufficiency (chronic) (peripheral): Secondary | ICD-10-CM | POA: Diagnosis not present

## 2018-10-01 DIAGNOSIS — Z87891 Personal history of nicotine dependence: Secondary | ICD-10-CM | POA: Diagnosis not present

## 2018-10-03 ENCOUNTER — Emergency Department (HOSPITAL_COMMUNITY): Payer: Medicare Other

## 2018-10-03 ENCOUNTER — Encounter (HOSPITAL_COMMUNITY): Payer: Self-pay

## 2018-10-03 ENCOUNTER — Emergency Department (HOSPITAL_COMMUNITY)
Admission: EM | Admit: 2018-10-03 | Discharge: 2018-10-03 | Disposition: A | Discharge status: Home / Self Care | Payer: Medicare Other | Attending: Emergency Medicine | Admitting: Emergency Medicine

## 2018-10-03 DIAGNOSIS — C7951 Secondary malignant neoplasm of bone: Secondary | ICD-10-CM | POA: Insufficient documentation

## 2018-10-03 DIAGNOSIS — E876 Hypokalemia: Secondary | ICD-10-CM

## 2018-10-03 DIAGNOSIS — Z96651 Presence of right artificial knee joint: Secondary | ICD-10-CM | POA: Insufficient documentation

## 2018-10-03 DIAGNOSIS — Z87891 Personal history of nicotine dependence: Secondary | ICD-10-CM | POA: Diagnosis not present

## 2018-10-03 DIAGNOSIS — E860 Dehydration: Secondary | ICD-10-CM | POA: Diagnosis not present

## 2018-10-03 DIAGNOSIS — R112 Nausea with vomiting, unspecified: Secondary | ICD-10-CM

## 2018-10-03 DIAGNOSIS — D649 Anemia, unspecified: Secondary | ICD-10-CM | POA: Diagnosis not present

## 2018-10-03 DIAGNOSIS — R531 Weakness: Secondary | ICD-10-CM

## 2018-10-03 DIAGNOSIS — R109 Unspecified abdominal pain: Secondary | ICD-10-CM | POA: Insufficient documentation

## 2018-10-03 DIAGNOSIS — Z79899 Other long term (current) drug therapy: Secondary | ICD-10-CM | POA: Insufficient documentation

## 2018-10-03 LAB — CBC
HCT: 28.7 % — ABNORMAL LOW (ref 36.0–46.0)
Hemoglobin: 8.8 g/dL — ABNORMAL LOW (ref 12.0–15.0)
MCH: 28.4 pg (ref 26.0–34.0)
MCHC: 30.7 g/dL (ref 30.0–36.0)
MCV: 92.6 fL (ref 80.0–100.0)
Platelets: 253 10*3/uL (ref 150–400)
RBC: 3.1 MIL/uL — ABNORMAL LOW (ref 3.87–5.11)
RDW: 18.3 % — ABNORMAL HIGH (ref 11.5–15.5)
WBC: 5 10*3/uL (ref 4.0–10.5)
nRBC: 0 % (ref 0.0–0.2)

## 2018-10-03 LAB — HEPATIC FUNCTION PANEL
ALT: 12 U/L (ref 0–44)
AST: 14 U/L — ABNORMAL LOW (ref 15–41)
Albumin: 3.5 g/dL (ref 3.5–5.0)
Alkaline Phosphatase: 68 U/L (ref 38–126)
Bilirubin, Direct: 0.1 mg/dL (ref 0.0–0.2)
Indirect Bilirubin: 0.3 mg/dL (ref 0.3–0.9)
Total Bilirubin: 0.4 mg/dL (ref 0.3–1.2)
Total Protein: 7.9 g/dL (ref 6.5–8.1)

## 2018-10-03 LAB — BASIC METABOLIC PANEL
Anion gap: 12 (ref 5–15)
BUN: 9 mg/dL (ref 8–23)
CO2: 23 mmol/L (ref 22–32)
Calcium: 9.6 mg/dL (ref 8.9–10.3)
Chloride: 101 mmol/L (ref 98–111)
Creatinine, Ser: 0.5 mg/dL (ref 0.44–1.00)
GFR calc Af Amer: >60 mL/min (ref >60)
GFR calc non Af Amer: >60 mL/min (ref >60)
Glucose, Bld: 137 mg/dL — ABNORMAL HIGH (ref 70–99)
Potassium: 3 mmol/L — ABNORMAL LOW (ref 3.5–5.1)
Sodium: 136 mmol/L (ref 135–145)

## 2018-10-03 LAB — URINALYSIS, ROUTINE W REFLEX MICROSCOPIC
Bacteria, UA: NONE SEEN
Bilirubin Urine: NEGATIVE
Glucose, UA: NEGATIVE mg/dL
Hgb urine dipstick: NEGATIVE
Ketones, ur: 20 mg/dL — AB
Leukocytes,Ua: NEGATIVE
Nitrite: NEGATIVE
Protein, ur: 30 mg/dL — AB
Specific Gravity, Urine: 1.021 (ref 1.005–1.030)
pH: 5 (ref 5.0–8.0)

## 2018-10-03 LAB — POC OCCULT BLOOD, ED: Fecal Occult Bld: NEGATIVE

## 2018-10-03 MED ORDER — SODIUM CHLORIDE 0.9% FLUSH
3.0000 mL | Freq: Once | INTRAVENOUS | Status: AC
  Administered 2018-10-03: 12:00:00 3 mL via INTRAVENOUS

## 2018-10-03 MED ORDER — HEPARIN SOD (PORK) LOCK FLUSH 100 UNIT/ML IV SOLN
500.0000 [IU] | Freq: Once | INTRAVENOUS | Status: AC
  Administered 2018-10-03: 15:00:00 500 [IU]
  Filled 2018-10-03: qty 5

## 2018-10-03 MED ORDER — SODIUM CHLORIDE (PF) 0.9 % IJ SOLN
INTRAMUSCULAR | Status: AC
  Filled 2018-10-03: qty 50

## 2018-10-03 MED ORDER — MORPHINE SULFATE (PF) 4 MG/ML IV SOLN
4.0000 mg | Freq: Once | INTRAVENOUS | Status: AC
  Administered 2018-10-03: 4 mg via INTRAVENOUS
  Filled 2018-10-03: qty 1

## 2018-10-03 MED ORDER — ONDANSETRON HCL 4 MG/2ML IJ SOLN
4.0000 mg | Freq: Once | INTRAMUSCULAR | Status: AC
  Administered 2018-10-03: 4 mg via INTRAVENOUS
  Filled 2018-10-03: qty 2

## 2018-10-03 MED ORDER — POTASSIUM CHLORIDE CRYS ER 20 MEQ PO TBCR
40.0000 meq | EXTENDED_RELEASE_TABLET | Freq: Once | ORAL | Status: AC
  Administered 2018-10-03: 40 meq via ORAL
  Filled 2018-10-03: qty 2

## 2018-10-03 MED ORDER — IOHEXOL 300 MG/ML  SOLN
100.0000 mL | Freq: Once | INTRAMUSCULAR | Status: AC | PRN
  Administered 2018-10-03: 100 mL via INTRAVENOUS

## 2018-10-03 MED ORDER — SODIUM CHLORIDE 0.9 % IV BOLUS
1000.0000 mL | Freq: Once | INTRAVENOUS | Status: AC
  Administered 2018-10-03: 12:00:00 1000 mL via INTRAVENOUS

## 2018-10-03 MED ORDER — ONDANSETRON 4 MG PO TBDP: 4.0000 mg | ORAL_TABLET | Freq: Three times a day (TID) | ORAL | 0 refills | Status: DC | PRN

## 2018-10-03 NOTE — ED Notes (Signed)
Patient transported to CT 

## 2018-10-03 NOTE — ED Provider Notes (Signed)
[facility] DEPT Provider Note   CSN: [id number] Arrival date & time: 10/03/18  1103    History   Chief Complaint Chief Complaint  Patient presents with  . Weakness    HPI [name] is a 73 y.o. female.     Pt presents to the ED today with n/v and abdominal pain.  The pt said it started early this morning.  The pt said she has not been able to keep down any fluids.  She has not had a fever.     Past Medical History:  Diagnosis Date  . Acute upper respiratory infection 07/06/2014  . Anemia   . Arthritis    Back   . Colon polyp    Tubular Adenoma   . Cough productive of clear sputum 06/22/2014  . Family history of breast cancer   . GERD (gastroesophageal reflux disease)   . History of right bundle branch block (RBBB)   . HOH (hard of hearing)   . Hypertension    had in the past, is no longer on medication for this and blood pressures are WNL  . 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 Primary localized osteoarthritis of right knee   . Uterine cancer ([place]) 06/23/2018    Patient Active Problem List   Diagnosis Date Noted  . Acetabulum fracture ([place]) 09/08/2018  . Left leg DVT ([place]) 09/07/2018  . Multiple open wounds of lower leg   . Cellulitis of left leg 08/24/2018  . Pancytopenia, acquired ([place]) 07/21/2018  . Genetic testing 07/20/2018  . Family history of breast cancer   . 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 Other constipation 06/24/2018  . Goals of care, counseling/discussion 06/24/2018  . 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 ([place]) 06/24/2018  . Uterine cancer ([place]) 06/23/2018  . 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 Lower leg DVT (deep venous thromboembolism), chronic, left ([place]) 06/23/2018  . DVT (deep venous thrombosis) ([place]) 01/18/2018  . DJD (degenerative joint disease) of knee 07/04/2014  . Primary localized osteoarthritis of right knee   .  Arthritis   . Anemia   . GERD (gastroesophageal reflux disease)   . Colon polyp   . HOH (hard of hearing)   . Postoperative anemia due to acute blood loss 05/01/2011  . 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 ABDOMINAL HYSTERECTOMY  2012  . CHOLECYSTECTOMY N/A 03/09/2013   Procedure: LAPAROSCOPIC CHOLECYSTECTOMY;  Surgeon: Gayland Curry, MD;  Location: [facility];  Service: General;  Laterality: N/A;  . COLONOSCOPY W/ BIOPSIES    . IR IMAGING GUIDED PORT INSERTION  06/29/2018  . LARYNGOSCOPY Left 03/14/2017   Procedure: LARYNGOSCOPY;  Surgeon: Helayne Seminole, MD;  Location: [facility];  Service: ENT;  Laterality: Left;  . LOWER EXTREMITY VENOGRAPHY Left 01/19/2018   Procedure: LOWER EXTREMITY VENOGRAPHY;  Surgeon: Waynetta Sandy, MD;  Location: [facility];  Service: Cardiovascular;  Laterality: Left;  . LOWER EXTREMITY VENOGRAPHY N/A 09/08/2018   Procedure: LOWER EXTREMITY VENOGRAPHY;  Surgeon: Waynetta Sandy, MD;  Location: [facility];  Service: Cardiovascular;  Laterality: N/A;  . LYMPH NODE BIOPSY Left 06/18/2018   Procedure: EXCISIONAL BIOPSY LEFT INGUINAL LYMPH NODE;  Surgeon: Coralie Keens, MD;  Location: [facility];  Service: General;  Laterality: Left;  . PERIPHERAL VASCULAR INTERVENTION Left 01/19/2018   Procedure: PERIPHERAL VASCULAR INTERVENTION;  Surgeon: Servando Snare  Harrell Gave, MD;  Location: [facility];  Service: Cardiovascular;  Laterality: Left;  LEFT ILIAC VENOUS  . PERIPHERAL VASCULAR INTERVENTION Left 09/08/2018   Procedure: PERIPHERAL VASCULAR INTERVENTION;  Surgeon: Waynetta Sandy, MD;  Location: [facility];  Service: Cardiovascular;  Laterality: Left;  lower extremity  . TOTAL KNEE ARTHROPLASTY  04/29/2011   Procedure: TOTAL KNEE ARTHROPLASTY;  Surgeon: Lorn Junes, MD;  Location: [facility];  Service: Orthopedics;  Laterality: Left;  DR [place] THIS  CASE  . TOTAL KNEE ARTHROPLASTY Right 07/04/2014   Procedure: TOTAL KNEE ARTHROPLASTY;  Surgeon: Elsie Saas, MD;  Location: [facility];  Service: Orthopedics;  Laterality: Right;     OB History   No obstetric history on file.      Home Medications    Prior to Admission medications   Medication Sig Start Date End Date Taking? Authorizing Provider  clopidogrel (PLAVIX) 75 MG tablet Take 1 tablet (75 mg total) by mouth daily with breakfast. 09/10/18   Kayleen Memos, DO  dexamethasone (DECADRON) 4 MG tablet Take 2 tabs at the night before and 2 tabs the morning of chemotherapy, every 3 weeks, by mouth Patient taking differently: Take 8 mg by mouth See admin instructions. Take 8 mg the night before chemo and 8 mg the morning of chemotherapy every 3 weeks 06/30/18   [name], MD  Eliquis DVT/PE Starter Pack (ELIQUIS STARTER PACK) 5 MG TABS Take as directed on package: start with two-38m tablets twice daily for 7 days. On day 8, switch to one-547mtablet twice daily. 09/10/18   HaKayleen MemosDO  morphine (MS CONTIN) 30 MG 12 hr tablet Take 1 tablet (30 mg total) by mouth every 12 (twelve) hours. Patient taking differently: Take 30 mg by mouth every 12 (twelve) hours as needed for pain.  07/07/18   GoHeath LarkMD  morphine (MSIR) 15 MG tablet Take 1 tablet (15 mg total) by mouth every 6 (six) hours as needed for severe pain. 07/07/18   GoHeath LarkMD  Olopatadine HCl 0.2 % SOLN Place 1 drop into both eyes daily. 12/06/17   [provider]  ondansetron (ZOFRAN ODT) 4 MG disintegrating tablet Take 1 tablet (4 mg total) by mouth every 8 (eight) hours as needed. 10/03/18   HaIsla PenceMD    Family History Family History  Problem Relation Age of Onset  . Arthritis Mother   . Hypertension Mother   . Alzheimer's disease Father   . Diabetes Sister   . Hypertension Sister   . Hypertension Brother   . Stroke Brother   . Hypertension Brother   . Hypertension Sister   . Hypertension  Sister   . Hypertension Sister   . Breast cancer Other        Niece  . Breast cancer Niece [id number]     sister's daughter  . Anesthesia problems Neg Hx   . Hypotension Neg Hx   . Malignant hyperthermia Neg Hx   . Pseudochol deficiency Neg Hx   . Colon cancer Neg Hx     Social History Social History   Tobacco Use  . Smoking status: Former Smoker    Years: 1.00    Quit date: 04/22/1988    Years since quitting: 30.4  . Smokeless tobacco: Never Used  Substance Use Topics  . Alcohol use: No  . Drug use: No     Allergies   Patient has no known allergies.  Review of Systems Review of Systems  Gastrointestinal: Positive for abdominal pain, nausea and vomiting.  All other systems reviewed and are negative.    Physical Exam Updated Vital Signs BP (!) 142/83   Pulse 88   Temp 97.6 F (36.4 C) (Oral)   Resp 16   SpO2 100%   Physical Exam Vitals signs and nursing note reviewed.  Constitutional:      Appearance: Normal appearance. She is obese.  HENT:     Head: Normocephalic and atraumatic.     Right Ear: External ear normal.     Left Ear: External ear normal.     Nose: Nose normal.     Mouth/Throat:     Mouth: Mucous membranes are dry.  Eyes:     Extraocular Movements: Extraocular movements intact.     Conjunctiva/sclera: Conjunctivae normal.     Pupils: Pupils are equal, round, and reactive to light.  Neck:     Musculoskeletal: Normal range of motion and neck supple.  Cardiovascular:     Rate and Rhythm: Normal rate and regular rhythm.     Pulses: Normal pulses.     Heart sounds: Normal heart sounds.  Pulmonary:     Effort: Pulmonary effort is normal.     Breath sounds: Normal breath sounds.  Abdominal:     General: Abdomen is flat. Bowel sounds are normal.     Palpations: Abdomen is soft.     Tenderness: There is generalized abdominal tenderness.  Genitourinary:    Rectum: Guaiac result negative.  Musculoskeletal:     Right lower leg: Edema present.      Left lower leg: Edema present.     Comments: Left hip pain (chronic)  Skin:    General: Skin is warm.     Capillary Refill: Capillary refill takes less than 2 seconds.  Neurological:     General: No focal deficit present.     Mental Status: She is alert and oriented to person, place, and time.  Psychiatric:        Mood and Affect: Mood normal.        Behavior: Behavior normal.        Thought Content: Thought content normal.        Judgment: Judgment normal.      ED Treatments / Results  Labs (all labs ordered are listed, but only abnormal results are displayed) Labs Reviewed  BASIC METABOLIC PANEL - Abnormal; Notable for the following components:      Result Value   Potassium 3.0 (*)    Glucose, Bld 137 (*)    All other components within normal limits  CBC - Abnormal; Notable for the following components:   RBC 3.10 (*)    Hemoglobin 8.8 (*)    HCT 28.7 (*)    RDW 18.3 (*)    All other components within normal limits  URINALYSIS, ROUTINE W REFLEX MICROSCOPIC - Abnormal; Notable for the following components:   Ketones, ur 20 (*)    Protein, ur 30 (*)    All other components within normal limits  HEPATIC FUNCTION PANEL - Abnormal; Notable for the following components:   AST 14 (*)    All other components within normal limits  POC OCCULT BLOOD, ED    EKG EKG Interpretation  Date/Time:  Saturday October 03 2018 11:33:41 EDT Ventricular Rate:  79 PR Interval:    QRS Duration: 142 QT Interval:  443 QTC Calculation: 508 R Axis:   -35 Text Interpretation:  Sinus arrhythmia  Borderline short PR interval Right bundle branch block No significant change since last tracing Confirmed by Isla Pence [phone]) on 10/03/2018 11:57:54 AM   Radiology Ct Abdomen Pelvis W Contrast  Result Date: 10/03/2018 CLINICAL DATA:  Diffuse abdominal pain with nausea and vomiting. History of endometrial cancer, currently on chemotherapy. EXAM: CT ABDOMEN AND PELVIS WITH CONTRAST TECHNIQUE:  Multidetector CT imaging of the abdomen and pelvis was performed using the standard protocol following bolus administration of intravenous contrast. CONTRAST:  163m OMNIPAQUE IOHEXOL 300 MG/ML  SOLN COMPARISON:  CT abdomen pelvis dated September 08, 2018. FINDINGS: Lower chest: No acute abnormality. Hepatobiliary: No focal liver abnormality is seen. Status post cholecystectomy. No biliary dilatation. Pancreas: Unremarkable. No pancreatic ductal dilatation or surrounding inflammatory changes. Spleen: Normal in size without focal abnormality. Adrenals/Urinary Tract: Adrenal glands are unremarkable. Kidneys are normal, without renal calculi, focal lesion, or hydronephrosis. Bladder is unremarkable. Stomach/Bowel: Stomach is within normal limits. Appendix appears normal. No evidence of bowel wall thickening, distention, or inflammatory changes. Diffuse colonic diverticulosis again noted. Vascular/Lymphatic: New venous stents extending from the left common iliac vein origin to the proximal left common femoral vein. The stent appears patent. Interval crushing and peripheral displacement of the previously seen chronically occluded left common/external iliac vein stent. No enlarged abdominal or pelvic lymph nodes. Reproductive: Status post hysterectomy. No adnexal masses. Other: No free fluid or pneumoperitoneum. Musculoskeletal: Grossly unchanged left pelvic sidewall mass measuring 8.1 x 3.5 cm with involvement of the medial acetabulum. Progressive mild displacement of the associated comminuted pathologic fracture involving the right acetabulum and puboacetabular junction. IMPRESSION: 1.  No acute intra-abdominal process. 2. Grossly unchanged left pelvic sidewall mass with osseous involvement of the medial acetabulum. Progressive mild displacement of the associated comminuted pathologic fracture involving the right acetabulum and puboacetabular junction. 3. New venous stents extending from the left common iliac vein origin to  the proximal left common femoral vein. The stents are patent. Electronically Signed   By: WTitus DubinM.D.   On: 10/03/2018 13:14    Procedures Procedures (including critical care time)  Medications Ordered in ED Medications  sodium chloride (PF) 0.9 % injection (has no administration in time range)  potassium chloride SA (K-DUR) CR tablet 40 mEq (has no administration in time range)  sodium chloride flush (NS) 0.9 % injection 3 mL (3 mLs Intravenous Given 10/03/18 1213)  sodium chloride 0.9 % bolus 1,000 mL (1,000 mLs Intravenous New Bag/Given 10/03/18 1210)  ondansetron (ZOFRAN) injection 4 mg (4 mg Intravenous Given 10/03/18 1211)  morphine 4 MG/ML injection 4 mg (4 mg Intravenous Given 10/03/18 1210)  iohexol (OMNIPAQUE) 300 MG/ML solution 100 mL (100 mLs Intravenous Contrast Given 10/03/18 1238)     Initial Impression / Assessment and Plan / ED Course  I have reviewed the triage vital signs and the nursing notes.  Pertinent labs & imaging results that were available during my care of the patient were reviewed by me and considered in my medical decision making (see chart for details).       Pt is feeling much better. Anemia is chronic.  Stool is negative for blood.  K is 3, so she was given 40 meq po.  She is able to tolerate po fluids.  Her ct abd/pelvis does not show anything acute.  The pt is instructed to return if worse.  F/u with pcp.  Final Clinical Impressions(s) / ED Diagnoses   Final diagnoses:  Weakness  Dehydration  Non-intractable vomiting with nausea, unspecified vomiting type  Chronic anemia  Hypokalemia    ED Discharge Orders         Ordered    ondansetron (ZOFRAN ODT) 4 MG disintegrating tablet  Every 8 hours PRN     10/03/18 1423           Isla Pence, MD 10/03/18 1426

## 2018-10-03 NOTE — ED Notes (Signed)
ED Provider at bedside. 

## 2018-10-03 NOTE — ED Triage Notes (Signed)
Transported by [facility] from home-- onset of weakness plus n/v that started Thursday. Hx of CA (unknown). Had been receiving chemotherapy since January and recently stopped.   Vitals:  160/94 88 HR 100% on RA CBG 181 mg/dl.

## 2018-10-08 ENCOUNTER — Telehealth (HOSPITAL_COMMUNITY): Payer: Self-pay | Admitting: Rehabilitation

## 2018-10-08 DIAGNOSIS — L97822 Non-pressure chronic ulcer of other part of left lower leg with fat layer exposed: Secondary | ICD-10-CM | POA: Diagnosis not present

## 2018-10-08 NOTE — Telephone Encounter (Signed)

## 2018-10-09 ENCOUNTER — Telehealth: Payer: Self-pay | Admitting: Oncology

## 2018-10-09 ENCOUNTER — Other Ambulatory Visit: Payer: Self-pay

## 2018-10-09 ENCOUNTER — Ambulatory Visit (INDEPENDENT_AMBULATORY_CARE_PROVIDER_SITE_OTHER): Payer: Medicare Other | Admitting: Vascular Surgery

## 2018-10-09 ENCOUNTER — Ambulatory Visit (HOSPITAL_COMMUNITY)
Admission: RE | Admit: 2018-10-09 | Discharge: 2018-10-09 | Disposition: A | Discharge status: Home / Self Care | Payer: Medicare Other | Source: Ambulatory Visit | Attending: Vascular Surgery | Admitting: Vascular Surgery

## 2018-10-09 ENCOUNTER — Encounter: Payer: Self-pay | Admitting: Vascular Surgery

## 2018-10-09 VITALS — BP 113/66 | HR 80 | Temp 97.0°F | Resp 14 | Ht 61.0 in | Wt 225.0 lb

## 2018-10-09 DIAGNOSIS — I82422 Acute embolism and thrombosis of left iliac vein: Secondary | ICD-10-CM | POA: Insufficient documentation

## 2018-10-09 DIAGNOSIS — I87002 Postthrombotic syndrome without complications of left lower extremity: Secondary | ICD-10-CM

## 2018-10-09 NOTE — Telephone Encounter (Signed)
I do not recommend resuming chemo until her wound is all healed Her recent CT showed no growth so we can continue to wait until she is all healed Please remind the son to call us when that happens

## 2018-10-09 NOTE — Progress Notes (Signed)
Patient ID: [name], female   DOB: 17-Apr-1945, 73 y.o.   MRN: [id number]  Reason for Consult: Follow-up   Referred by Nolene Ebbs, MD  Subjective:     HPI:  [name] is a 73 y.o. female previously underwent left common external iliac vein stenting for what was initially thought to be clot but turned out to be stagnant flow secondary to invasive endometrial cancer.  She then underwent radiation was found to have occlusive stents.  She subsequently underwent mechanical thrombectomy with repeat stenting of the left common external leg veins with Vici extended down to the common femoral vein with a Wallstent.  She now has a superficial wound on the left leg for which she is seeing Dr. Dellia Nims.  She is good healing of this.  States that the leg feels much better.  Chemotherapy is pending ongoing treatment.  She is taking Plavix and Eliquis at this time.  Past Medical History:  Diagnosis Date  . Acute upper respiratory infection 07/06/2014  . Anemia   . Arthritis    Back   . Colon polyp    Tubular Adenoma   . Cough productive of clear sputum 06/22/2014  . Family history of breast cancer   . GERD (gastroesophageal reflux disease)   . History of right bundle branch block (RBBB)   . HOH (hard of hearing)   . Hypertension    had in the past, is no longer on medication for this and blood pressures are WNL  . 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 Primary localized osteoarthritis of right knee   . Uterine cancer ([place]) 06/23/2018   Family History  Problem Relation Age of Onset  . Arthritis Mother   . Hypertension Mother   . Alzheimer's disease Father   . Diabetes Sister   . Hypertension Sister   . Hypertension Brother   . Stroke Brother   . Hypertension Brother   . Hypertension Sister   . Hypertension Sister   . Hypertension Sister   . Breast cancer Other        Niece  . Breast cancer Niece 6       sister's daughter  . Anesthesia problems Neg Hx   . Hypotension Neg Hx   .  Malignant hyperthermia Neg Hx   . Pseudochol deficiency Neg Hx   . Colon cancer Neg Hx    Past Surgical History:  Procedure Laterality Date  . ABDOMINAL HYSTERECTOMY  2012  . CHOLECYSTECTOMY N/A 03/09/2013   Procedure: LAPAROSCOPIC CHOLECYSTECTOMY;  Surgeon: Gayland Curry, MD;  Location: [facility];  Service: General;  Laterality: N/A;  . COLONOSCOPY W/ BIOPSIES    . IR IMAGING GUIDED PORT INSERTION  06/29/2018  . LARYNGOSCOPY Left 03/14/2017   Procedure: LARYNGOSCOPY;  Surgeon: Helayne Seminole, MD;  Location: [facility];  Service: ENT;  Laterality: Left;  . LOWER EXTREMITY VENOGRAPHY Left 01/19/2018   Procedure: LOWER EXTREMITY VENOGRAPHY;  Surgeon: Waynetta Sandy, MD;  Location: [facility];  Service: Cardiovascular;  Laterality: Left;  . LOWER EXTREMITY VENOGRAPHY N/A 09/08/2018   Procedure: LOWER EXTREMITY VENOGRAPHY;  Surgeon: Waynetta Sandy, MD;  Location: [facility];  Service: Cardiovascular;  Laterality: N/A;  . LYMPH NODE BIOPSY Left 06/18/2018   Procedure: EXCISIONAL BIOPSY LEFT INGUINAL LYMPH NODE;  Surgeon: Coralie Keens, MD;  Location: [facility];  Service: General;  Laterality: Left;  . PERIPHERAL VASCULAR INTERVENTION Left 01/19/2018   Procedure: PERIPHERAL VASCULAR INTERVENTION;  Surgeon: Servando Snare  Harrell Gave, MD;  Location: [facility];  Service: Cardiovascular;  Laterality: Left;  LEFT ILIAC VENOUS  . PERIPHERAL VASCULAR INTERVENTION Left 09/08/2018   Procedure: PERIPHERAL VASCULAR INTERVENTION;  Surgeon: Waynetta Sandy, MD;  Location: [facility];  Service: Cardiovascular;  Laterality: Left;  lower extremity  . TOTAL KNEE ARTHROPLASTY  04/29/2011   Procedure: TOTAL KNEE ARTHROPLASTY;  Surgeon: Lorn Junes, MD;  Location: [facility];  Service: Orthopedics;  Laterality: Left;  DR [place] THIS CASE  . TOTAL KNEE ARTHROPLASTY Right 07/04/2014   Procedure: TOTAL KNEE ARTHROPLASTY;  Surgeon: Elsie Saas,  MD;  Location: [facility];  Service: Orthopedics;  Laterality: Right;    Short Social History:  Social History   Tobacco Use  . Smoking status: Former Smoker    Years: 1.00    Quit date: 04/22/1988    Years since quitting: 30.4  . Smokeless tobacco: Never Used  Substance Use Topics  . Alcohol use: No    No Known Allergies  Current Outpatient Medications  Medication Sig Dispense Refill  . clopidogrel (PLAVIX) 75 MG tablet Take 1 tablet (75 mg total) by mouth daily with breakfast. 30 tablet 1  . dexamethasone (DECADRON) 4 MG tablet Take 2 tabs at the night before and 2 tabs the morning of chemotherapy, every 3 weeks, by mouth (Patient taking differently: Take 8 mg by mouth See admin instructions. Take 8 mg the night before chemo and 8 mg the morning of chemotherapy every 3 weeks) 24 tablet 0  . Eliquis DVT/PE Starter Pack (ELIQUIS STARTER PACK) 5 MG TABS Take as directed on package: start with two-5mg  tablets twice daily for 7 days. On day 8, switch to one-5mg  tablet twice daily. 1 each 0  . morphine (MS CONTIN) 30 MG 12 hr tablet Take 1 tablet (30 mg total) by mouth every 12 (twelve) hours. (Patient taking differently: Take 30 mg by mouth every 12 (twelve) hours as needed for pain. ) 60 tablet 0  . Olopatadine HCl 0.2 % SOLN Place 1 drop into both eyes daily.    . ondansetron (ZOFRAN ODT) 4 MG disintegrating tablet Take 1 tablet (4 mg total) by mouth every 8 (eight) hours as needed. 10 tablet 0  . morphine (MSIR) 15 MG tablet Take 1 tablet (15 mg total) by mouth every 6 (six) hours as needed for severe pain. (Patient not taking: Reported on 10/09/2018) 60 tablet 0   No current facility-administered medications for this visit.     Review of Systems  Constitutional:  Constitutional negative. HENT: HENT negative.  Eyes: Eyes negative.  Cardiovascular: Cardiovascular negative.  GI: Gastrointestinal negative.  Skin: Positive for wound.  Neurological: Neurological negative. Hematologic:  Hematologic/lymphatic negative.  Psychiatric: Psychiatric negative.        Objective:  Objective   Vitals:   10/09/18 0851  BP: 113/66  Pulse: 80  Resp: 14  Temp: (!) 97 F (36.1 C)  TempSrc: Temporal  SpO2: 100%  Weight: 225 lb (102.1 kg)  Height: 5\' 1"  (1.549 m)   Body mass index is 42.51 kg/m.  Physical Exam HENT:     Head: Normocephalic.     Nose: Nose normal.  Neck:     Musculoskeletal: Normal range of motion.  Cardiovascular:     Rate and Rhythm: Normal rate.  Pulmonary:     Effort: Pulmonary effort is normal.  Abdominal:     General: Abdomen is flat.     Palpations: Abdomen is soft.  Musculoskeletal:        General: Swelling present.     Comments: Dressing in place on left lower extremity  Neurological:     General: No focal deficit present.     Mental Status: She is alert.  Psychiatric:        Mood and Affect: Mood normal.        Behavior: Behavior normal.        Thought Content: Thought content normal.        Judgment: Judgment normal.     Data: I have independently interpreted her IVC iliac study which demonstrates patent common external iliac vein stents on the left.  The greater saphenous vein and saphenofemoral junction does appear occluded.  There is a bandage on the lower leg.     Assessment/Plan:     73 year old female with above-noted procedures with stenting from left common iliac down to the left common femoral vein initially caused from invasive endometrial cancer.  She is completed radiation.  From a vascular standpoint should be okay from chemotherapy.  Would recommend continuing anticoagulation and Plavix until she is completed.  We will follow her up in 4 to 6 months with repeat studies unless she has issues prior.     Waynetta Sandy MD [facility]

## 2018-10-09 NOTE — Telephone Encounter (Signed)
Called [name] to see how she is doing.  She said she is feeling "pretty good."  Asked if her leg is healed and she said it is "coming along." She had it wrapped yesterday at the [facility].  She also said she saw Dr. Donzetta Matters this morning.  Also called her son, Marguerite Olea, and he said she is doing well except for pain in her side "where the cancer was."  He said she is taking the pain medication that was given to her by Dr. Alvy Bimler.  He also said she saw Dr. Donzetta Matters this morning and he said she was doing good.

## 2018-10-09 NOTE — Telephone Encounter (Signed)
Called Darrell back and left a message regarding message below from Dr. Alvy Bimler.

## 2018-10-15 ENCOUNTER — Telehealth: Payer: Self-pay

## 2018-10-15 DIAGNOSIS — L97822 Non-pressure chronic ulcer of other part of left lower leg with fat layer exposed: Secondary | ICD-10-CM | POA: Diagnosis not present

## 2018-10-15 NOTE — Telephone Encounter (Signed)
-----   Message from [name], MD sent at 10/15/2018  7:50 AM EDT ----- Regarding: RE: refill request PCP to refill I am currently not following her yet until her wound is healed ----- Message ----- From: Paulla Dolly, RN Sent: 10/14/2018   1:58 PM EDT To: Flo Shanks, RN, [name], MD Subject: refill request                                 Pt was changed from Xarelto to Eliquis when she was discharged from her most recent hospital stay.  She called to request a refill on the Eliquis.  Will you manage this medication for her or does she need to get this from her PCP?

## 2018-10-15 NOTE — Telephone Encounter (Signed)
Called and left a message asking her to call the office back. 

## 2018-10-15 NOTE — Telephone Encounter (Signed)
She called back. Given below message. She verbalized understanding. 

## 2018-10-22 ENCOUNTER — Other Ambulatory Visit: Payer: Self-pay

## 2018-10-22 ENCOUNTER — Encounter (HOSPITAL_BASED_OUTPATIENT_CLINIC_OR_DEPARTMENT_OTHER): Payer: Medicare Other | Attending: Internal Medicine

## 2018-10-22 DIAGNOSIS — I872 Venous insufficiency (chronic) (peripheral): Secondary | ICD-10-CM | POA: Insufficient documentation

## 2018-10-22 DIAGNOSIS — Z872 Personal history of diseases of the skin and subcutaneous tissue: Secondary | ICD-10-CM | POA: Insufficient documentation

## 2018-10-22 DIAGNOSIS — Z09 Encounter for follow-up examination after completed treatment for conditions other than malignant neoplasm: Secondary | ICD-10-CM | POA: Insufficient documentation

## 2018-10-23 ENCOUNTER — Telehealth: Payer: Self-pay

## 2018-10-23 NOTE — Telephone Encounter (Signed)
-----   Message from [name], MD sent at 10/23/2018  7:54 AM EDT ----- Regarding: call son Is her leg ulcer healed yet?

## 2018-10-27 NOTE — Telephone Encounter (Signed)
Called son and given below message. The ulcer is not completely healed. [name] has appt Thursday for dressing change. Instructed him to call the office when ulcer is healed. He verbalized understanding.

## 2018-10-29 ENCOUNTER — Telehealth: Payer: Self-pay | Admitting: *Deleted

## 2018-10-29 DIAGNOSIS — Z09 Encounter for follow-up examination after completed treatment for conditions other than malignant neoplasm: Secondary | ICD-10-CM | POA: Diagnosis not present

## 2018-10-29 NOTE — Telephone Encounter (Signed)
Son called to report her wound has healed and she is ready to schedule to restart chemo.

## 2018-10-30 ENCOUNTER — Other Ambulatory Visit: Payer: Self-pay | Admitting: Hematology and Oncology

## 2018-10-30 DIAGNOSIS — C774 Secondary and unspecified malignant neoplasm of inguinal and lower limb lymph nodes: Secondary | ICD-10-CM

## 2018-10-30 DIAGNOSIS — Z7189 Other specified counseling: Secondary | ICD-10-CM

## 2018-10-30 DIAGNOSIS — C55 Malignant neoplasm of uterus, part unspecified: Secondary | ICD-10-CM

## 2018-10-30 DIAGNOSIS — C7951 Secondary malignant neoplasm of bone: Secondary | ICD-10-CM

## 2018-10-30 DIAGNOSIS — C801 Malignant (primary) neoplasm, unspecified: Secondary | ICD-10-CM

## 2018-10-30 NOTE — Progress Notes (Signed)
DISCONTINUE ON PATHWAY REGIMEN - Uterine     A cycle is every 21 days:     Paclitaxel      Carboplatin   **Always confirm dose/schedule in your pharmacy ordering system**  REASON: Toxicities / Adverse Event PRIOR TREATMENT: [id number]: Carboplatin + Paclitaxel (6/175) q21 Days Until Progression or Unacceptable Toxicity TREATMENT RESPONSE: Stable Disease (SD)  START OFF PATHWAY REGIMEN - Uterine   [id number]:Pembrolizumab 200 mg q21 Days:   A cycle is 21 days:     Pembrolizumab   **Always confirm dose/schedule in your pharmacy ordering system**  Patient Characteristics: Endometrioid Histology, Recurrent/Progressive Disease, Second Line, Persistent Disease Post First Line Chemotherapy for Metastatic Disease, < 6 Months Since Previous Chemotherapy Histology: Endometrioid Histology Therapeutic Status: Recurrent or Progressive Disease AJCC T Category: T1 AJCC N Category: N2 AJCC M Category: M1 AJCC 8 Stage Grouping: IVB Line of Therapy: Second Line Time to Recurrence: < 6 Months Since Previous Chemotherapy Intent of Therapy: Non-Curative / Palliative Intent, Discussed with Patient

## 2018-10-30 NOTE — Telephone Encounter (Signed)
I spoke with her son and daughter-in-law over the telephone After completion of 3 cycles of carboplatin and Taxol, she developed multiple complications including 3 hospitalization His CT imaging from August show stable lymphadenopathy with no significant signs of disease regression Recommend switching her treatment to pembrolizumab due to MSI high disease I tried my best to explain the rationale of switching her treatment to her son and daughter-in-law and they agree with the plan of care I will get her started on treatment next week

## 2018-11-02 ENCOUNTER — Telehealth: Payer: Self-pay | Admitting: Hematology and Oncology

## 2018-11-02 NOTE — Telephone Encounter (Signed)
Scheduled appt per 9/11 sch message - pt aware of appt date and time   

## 2018-11-05 ENCOUNTER — Other Ambulatory Visit: Payer: Self-pay | Admitting: Hematology and Oncology

## 2018-11-05 ENCOUNTER — Telehealth: Payer: Self-pay | Admitting: *Deleted

## 2018-11-05 MED ORDER — MORPHINE SULFATE 15 MG PO TABS: 15.0000 mg | ORAL_TABLET | Freq: Four times a day (QID) | ORAL | 0 refills | Status: DC | PRN

## 2018-11-05 NOTE — Telephone Encounter (Signed)
It looks like MS contin and IR morphine has not been refilled since May I suggest she takes 15 mg IR morphine first before I refill her MS contin If she is not taking more than 4 x per day, she does not need MS contin

## 2018-11-05 NOTE — Telephone Encounter (Signed)
Daryl called asking for Morphine 30mg  tablets to be refilled for patient. [place]

## 2018-11-06 ENCOUNTER — Other Ambulatory Visit: Payer: Self-pay

## 2018-11-06 ENCOUNTER — Inpatient Hospital Stay: Payer: Medicare Other

## 2018-11-06 ENCOUNTER — Encounter: Payer: Self-pay | Admitting: Hematology and Oncology

## 2018-11-06 ENCOUNTER — Inpatient Hospital Stay: Payer: Medicare Other | Attending: Hematology and Oncology | Admitting: Hematology and Oncology

## 2018-11-06 DIAGNOSIS — M7989 Other specified soft tissue disorders: Secondary | ICD-10-CM | POA: Diagnosis not present

## 2018-11-06 DIAGNOSIS — C7951 Secondary malignant neoplasm of bone: Secondary | ICD-10-CM

## 2018-11-06 DIAGNOSIS — C55 Malignant neoplasm of uterus, part unspecified: Secondary | ICD-10-CM

## 2018-11-06 DIAGNOSIS — C801 Malignant (primary) neoplasm, unspecified: Secondary | ICD-10-CM

## 2018-11-06 DIAGNOSIS — C774 Secondary and unspecified malignant neoplasm of inguinal and lower limb lymph nodes: Secondary | ICD-10-CM

## 2018-11-06 DIAGNOSIS — Z7189 Other specified counseling: Secondary | ICD-10-CM

## 2018-11-06 DIAGNOSIS — Z86718 Personal history of other venous thrombosis and embolism: Secondary | ICD-10-CM | POA: Insufficient documentation

## 2018-11-06 DIAGNOSIS — Z5112 Encounter for antineoplastic immunotherapy: Secondary | ICD-10-CM | POA: Insufficient documentation

## 2018-11-06 DIAGNOSIS — I825Z2 Chronic embolism and thrombosis of unspecified deep veins of left distal lower extremity: Secondary | ICD-10-CM | POA: Diagnosis not present

## 2018-11-06 DIAGNOSIS — Z79899 Other long term (current) drug therapy: Secondary | ICD-10-CM | POA: Insufficient documentation

## 2018-11-06 DIAGNOSIS — G893 Neoplasm related pain (acute) (chronic): Secondary | ICD-10-CM | POA: Diagnosis not present

## 2018-11-06 DIAGNOSIS — C541 Malignant neoplasm of endometrium: Secondary | ICD-10-CM | POA: Diagnosis present

## 2018-11-06 DIAGNOSIS — Z7901 Long term (current) use of anticoagulants: Secondary | ICD-10-CM | POA: Insufficient documentation

## 2018-11-06 LAB — CBC WITH DIFFERENTIAL (CANCER CENTER ONLY)
Abs Immature Granulocytes: 0.01 10*3/uL (ref 0.00–0.07)
Basophils Absolute: 0 10*3/uL (ref 0.0–0.1)
Basophils Relative: 0 %
Eosinophils Absolute: 0 10*3/uL (ref 0.0–0.5)
Eosinophils Relative: 1 %
HCT: 28.2 % — ABNORMAL LOW (ref 36.0–46.0)
Hemoglobin: 8.9 g/dL — ABNORMAL LOW (ref 12.0–15.0)
Immature Granulocytes: 0 %
Lymphocytes Relative: 18 %
Lymphs Abs: 0.6 10*3/uL — ABNORMAL LOW (ref 0.7–4.0)
MCH: 30 pg (ref 26.0–34.0)
MCHC: 31.6 g/dL (ref 30.0–36.0)
MCV: 94.9 fL (ref 80.0–100.0)
Monocytes Absolute: 0.3 10*3/uL (ref 0.1–1.0)
Monocytes Relative: 9 %
Neutro Abs: 2.3 10*3/uL (ref 1.7–7.7)
Neutrophils Relative %: 72 %
Platelet Count: 246 10*3/uL (ref 150–400)
RBC: 2.97 MIL/uL — ABNORMAL LOW (ref 3.87–5.11)
RDW: 16.7 % — ABNORMAL HIGH (ref 11.5–15.5)
WBC Count: 3.2 10*3/uL — ABNORMAL LOW (ref 4.0–10.5)
nRBC: 0 % (ref 0.0–0.2)

## 2018-11-06 LAB — CMP (CANCER CENTER ONLY)
ALT: <6 U/L (ref 0–44)
AST: 11 U/L — ABNORMAL LOW (ref 15–41)
Albumin: 3.4 g/dL — ABNORMAL LOW (ref 3.5–5.0)
Alkaline Phosphatase: 77 U/L (ref 38–126)
Anion gap: 8 (ref 5–15)
BUN: 7 mg/dL — ABNORMAL LOW (ref 8–23)
CO2: 25 mmol/L (ref 22–32)
Calcium: 9.1 mg/dL (ref 8.9–10.3)
Chloride: 106 mmol/L (ref 98–111)
Creatinine: 0.71 mg/dL (ref 0.44–1.00)
GFR, Est AFR Am: >60 mL/min (ref >60)
GFR, Estimated: >60 mL/min (ref >60)
Glucose, Bld: 97 mg/dL (ref 70–99)
Potassium: 3.6 mmol/L (ref 3.5–5.1)
Sodium: 139 mmol/L (ref 135–145)
Total Bilirubin: 0.5 mg/dL (ref 0.3–1.2)
Total Protein: 7 g/dL (ref 6.5–8.1)

## 2018-11-06 LAB — TSH: TSH: 1.329 u[IU]/mL (ref 0.308–3.960)

## 2018-11-06 MED ORDER — SODIUM CHLORIDE 0.9% FLUSH
10.0000 mL | Freq: Once | INTRAVENOUS | Status: AC
  Administered 2018-11-06: 10 mL
  Filled 2018-11-06: qty 10

## 2018-11-06 MED ORDER — SODIUM CHLORIDE 0.9 % IV SOLN
200.0000 mg | Freq: Once | INTRAVENOUS | Status: AC
  Administered 2018-11-06: 200 mg via INTRAVENOUS
  Filled 2018-11-06: qty 8

## 2018-11-06 MED ORDER — SODIUM CHLORIDE 0.9 % IV SOLN
Freq: Once | INTRAVENOUS | Status: AC
  Administered 2018-11-06: 13:00:00 via INTRAVENOUS
  Filled 2018-11-06: qty 250

## 2018-11-06 MED ORDER — HEPARIN SOD (PORK) LOCK FLUSH 100 UNIT/ML IV SOLN
500.0000 [IU] | Freq: Once | INTRAVENOUS | Status: AC | PRN
  Administered 2018-11-06: 500 [IU]
  Filled 2018-11-06: qty 5

## 2018-11-06 MED ORDER — SODIUM CHLORIDE 0.9% FLUSH
10.0000 mL | INTRAVENOUS | Status: DC | PRN
  Administered 2018-11-06: 10 mL
  Filled 2018-11-06: qty 10

## 2018-11-06 NOTE — Assessment & Plan Note (Signed)
We reviewed the guidelines Treatment intent is strictly palliative The treatment decision is based on the following publication for patients who tested positive for PD-L1 or MSI high  Pembrolizumab in advanced endometrial cancer: Preliminary results from the phase Ib KEYNOTE-028 study  DOI: 10.1200/[id number] Journal of Clinical Oncology 34, no. 15_suppl (Jul 08 2014) 4081-4481.  Published online Jun 29, 2015.  Background: There are currently no treatment options for pts with endometrial cancer who progress on chemotherapy. Pembrolizumab is an anti-PD-1 antibody that blocks interaction between PD-1 and its ligands, PD-L1/PD-L2. The safety and efficacy of pembrolizumab were evaluated in advanced solid tumors in the [place] study ([id number]). Preliminary results from the endometrial carcinoma cohort are presented.   Methods: Key inclusion criteria were advanced endometrial carcinoma (excluded sarcomas, mesenchymal tumors), failure of prior systemic therapy, ECOG PS 0-1, and PD-L1 expression in ? 1% of tumor or stroma cells by IHC. Pembrolizumab 10 mg/kg every 2 wk was given for up to 24 mo or until confirmed progression, intolerable toxicity, death, or consent withdrawal. Response was assessed every 8 wk for the first 6 mo and every 12 wk thereafter. The primary end point was ORR per RECIST v1.1 by investigator assessment.   Results: In the 24 pts enrolled, median age was 44.0 y; 66.7% had an ECOG PS of 1; 62.4% received ? 2 prior therapies for metastatic disease. As of Jan 27, 2014, median follow-up duration was 69.9 wk (range, 5.4-84.4). Treatment-related AEs (TRAEs) occurred in 13 (54.2%) pts; most common were pruritus (n = 4, 16.7%), asthenia, fatigue, pyrexia, and decreased appetite (n = 3 each; 12.5%). Three pts experienced grade 3 TRAEs: 1 pt had back pain and asthenia (both resolved), 1 pt had diarrhea (resolved), 1 pt had asthenia, anemia, hyperglycemia, and hyponatremia (all  unresolved). No pts died or discontinued pembrolizumab because of a TRAE. ORR (confirmed) was 13.0% (PR, n = 3; 95% CI, 2.8-33.6); median duration of response was not yet reached (range, 40.3+ to 65.1+ wk). Three pts achieved stable disease (13%; 95% CI, 2.8-33.6; median duration, 24.6 wk [range, 13.1-24.6]). Six-mo PFS and OS rates were 19.0% and 68.8%, respectively. At data cutoff, all 3 pts with PRs remained on treatment.   Conclusions: Pembrolizumab demonstrated an acceptable safety profile and preliminary antitumor activity in heavily pretreated PD-L1+ advanced endometrial cancer pts. The clinical benefit of pembrolizumab for advanced endometrial cancer is being further investigated in the phase 2 KEYNOTE-158 trial ([id number])  We discussed the importance of TSH monitoring I recommend minimum 3 months of treatment before repeat imaging study Based on other recent publication on flat dose treatment available, I am recommending pembrolizumab to 200 mg flat dose every 3 weeks

## 2018-11-06 NOTE — Assessment & Plan Note (Signed)
She denies worsening leg swelling after radiation treatment She will continue chronic anticoagulation therapy indefinitely

## 2018-11-06 NOTE — Progress Notes (Signed)
[place] OFFICE PROGRESS NOTE  Patient Care Team: Nolene Ebbs, MD as PCP - General (Internal Medicine)  ASSESSMENT & PLAN:  Uterine cancer [facility]) We reviewed the guidelines Treatment intent is strictly palliative The treatment decision is based on the following publication for patients who tested positive for PD-L1 or MSI high  Pembrolizumab in advanced endometrial cancer: Preliminary results from the phase Ib KEYNOTE-028 study  DOI: 10.1200/JCO.2016.34.15_suppl.5581 Journal of Clinical Oncology 34, no. 15_suppl (Jul 08 2014) 6378-5885.  Published online Jun 29, 2015.  Background: There are currently no treatment options for pts with endometrial cancer who progress on chemotherapy. Pembrolizumab is an anti-PD-1 antibody that blocks interaction between PD-1 and its ligands, PD-L1/PD-L2. The safety and efficacy of pembrolizumab were evaluated in advanced solid tumors in the [place] study ([id number]). Preliminary results from the endometrial carcinoma cohort are presented.   Methods: Key inclusion criteria were advanced endometrial carcinoma (excluded sarcomas, mesenchymal tumors), failure of prior systemic therapy, ECOG PS 0-1, and PD-L1 expression in ? 1% of tumor or stroma cells by IHC. Pembrolizumab 10 mg/kg every 2 wk was given for up to 24 mo or until confirmed progression, intolerable toxicity, death, or consent withdrawal. Response was assessed every 8 wk for the first 6 mo and every 12 wk thereafter. The primary end point was ORR per RECIST v1.1 by investigator assessment.   Results: In the 24 pts enrolled, median age was 75.0 y; 66.7% had an ECOG PS of 1; 62.4% received ? 2 prior therapies for metastatic disease. As of Jan 27, 2014, median follow-up duration was 69.9 wk (range, 5.4-84.4). Treatment-related AEs (TRAEs) occurred in 13 (54.2%) pts; most common were pruritus (n = 4, 16.7%), asthenia, fatigue, pyrexia, and decreased appetite (n = 3 each; 12.5%). Three  pts experienced grade 3 TRAEs: 1 pt had back pain and asthenia (both resolved), 1 pt had diarrhea (resolved), 1 pt had asthenia, anemia, hyperglycemia, and hyponatremia (all unresolved). No pts died or discontinued pembrolizumab because of a TRAE. ORR (confirmed) was 13.0% (PR, n = 3; 95% CI, 2.8-33.6); median duration of response was not yet reached (range, 40.3+ to 65.1+ wk). Three pts achieved stable disease (13%; 95% CI, 2.8-33.6; median duration, 24.6 wk [range, 13.1-24.6]). Six-mo PFS and OS rates were 19.0% and 68.8%, respectively. At data cutoff, all 3 pts with PRs remained on treatment.   Conclusions: Pembrolizumab demonstrated an acceptable safety profile and preliminary antitumor activity in heavily pretreated PD-L1+ advanced endometrial cancer pts. The clinical benefit of pembrolizumab for advanced endometrial cancer is being further investigated in the phase 2 KEYNOTE-158 trial ([id number])  We discussed the importance of TSH monitoring I recommend minimum 3 months of treatment before repeat imaging study Based on other recent publication on flat dose treatment available, I am recommending pembrolizumab to 200 mg flat dose every 3 weeks   Cancer associated pain I have extensive discussion about pain management I recommend she take IR  morphine as needed for now If she needs more than 4 doses per day, I will start her back on MS Contin  Lower leg DVT (deep venous thromboembolism), chronic, left ([place]) She denies worsening leg swelling after radiation treatment She will continue chronic anticoagulation therapy indefinitely  Goals of care, counseling/discussion The patient is aware she has incurable disease and treatment is strictly palliative.   No orders of the defined types were placed in this encounter.   INTERVAL HISTORY: Please see below for problem oriented charting. She returns for treatment and follow-up Her wound  is healed She continues to have poor mobility due to  her chronic leg swelling The patient denies any recent signs or symptoms of bleeding such as spontaneous epistaxis, hematuria or hematochezia. 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 Uterus +/- tubes/ovaries, neoplastic, with left fallopian tube and ovary - INVASIVE ENDOMETRIOID CARCINOMA (1.5 CM), FIGO GRADE II, ARISING IN A BACKGROUND OF ATYPICAL COMPLEX HYPERPLASIA, CONFINED WITHIN INNER HALF OF THE MYOMETRIUM. - ENDOMETRIAL POLYP WITH ASSOCIATED ATYPICAL COMPLEX HYPERPLASIA. - MYOMETRIUM: LEIOMYOMATA. - CERVIX: BENIGN SQUAMOUS MUCOSA AND ENDOCERVICAL MUCOSA, NO DYSPLASIA OR MALIGNANCY. - LEFT OVARY: BENIGN OVARIAN TISSUE WITH ENDOSALPINGOSIS, NO EVIDENCE OF ATYPIA OR MALIGNANCY. - LEFT FALLOPIAN TUBE: NO HISTOLOGIC ABNORMALITIES. - PLEASE SEE ONCOLOGY TEMPLATE FOR DETAIL. 2. Ovary and fallopian tube, right - BENIGN OVARIAN TISSUE WITH ENDOSALPINGOSIS, NO ATYPIA OR MALIGNANCY. - BENIGN FALLOPIAN TUBAL TISSUE, NO PATHOLOGIC ABNORMALITIES. Microscopic Comment 1. UTERUS Specimen: Uterus, cervix, bilateral ovaries and fallopian tubes Procedure: Total hysterectomy and bilateral salpingo-oophorectomy Lymph node sampling performed: No Specimen integrity: Intact Maximum tumor size (cm): 1.5 cm, glass slide measurement Histologic type: Invasive endometrioid carcinoma Grade: FIGO grade II Myometrial invasion: 1 cm where myometrium is 2.3 cm in thickness Cervical stromal involvement: No Extent of involvement of other organs: No Lymph vascular invasion: Not identified Peritoneal washings: Negative ([id number]) Lymph nodes: number examined N/A; number positive N/A TNM code:  pT1a, pNX 1 oFf 3IGO Stage (based on pathologic findings, needs clinical correlation): IA  Comments: Sections the endomyometrium away from the grossly identified endometrial polyp show an invasive FIGO grade II endometrioid carcinoma. The tumor is confined within inner half of the myometrium. No angiolymphatic invasion is identified. No cervical stromal involvement is identified. Sections of the grossly identified endometrial polyp show an endometrial polyp with associated atypical compacted hyperplasia with no definitive evidence of carcinoma.   12/07/2017 Imaging   US venous Doppler Right: No evidence of common femoral vein obstruction. Left: Findings consistent with acute deep vein thrombosis involving the left femoral vein, left proximal profunda vein, and left popliteal vein. Unable to adequately interrogate the common femoral and higher, or the calf secondary to significant edema and body habitus   12/07/2017 [facility] Admission   She presented to the ER and was diagnosed with acute DVT   01/18/2018 - 01/21/2018 Hospital Admission   She was admitted to the hospital for management of severe persistent DVT   01/18/2018 Imaging   US venous Doppler Right: No evidence of common femoral vein obstruction. Left: Findings consistent with acute deep vein thrombosis involving the left common femoral vein, and left popliteal vein.   01/19/2018 Surgery   Pre-operative Diagnosis: Subacute DVT with severe post thrombotic syndrome Post-operative diagnosis:  Same Surgeon:  Erlene Quan C. Donzetta Matters, MD Procedure Performed: 1.  Ultrasound-guided cannulation left small saphenous vein 2.  Left lower extremity and central venography 3.  Intravascular ultrasound of left popliteal, femoral, common femoral, external and common iliac veins and IVC 4.  Stent of left common and external iliac veins with 14 x 60 mm Vici 5.  Moderate sedation with fentanyl and Versed for 50 minutes  Indications: 73 year old female  with a history of DVT in October now presents with  persistent left lower extremity swelling and ultrasound demonstrating likely persistent DVT.  She has been on Xarelto at this time.  She is now indicated for venogram possible intervention.  Findings: Flow in the left lower extremity was stagnant throughout but by venogram all veins were patent.  There was a focal occlusive area approximately 2 cm in length at the common and external iliac vein junction at the hypogastric on the left.  After stenting and ballooning we had a diameter of 12 millimeters in the stent and venogram demonstrated flow in the lower extremity veins were previously was stagnant and no further residual stenosis in the left common and external iliac vein junction.   04/12/2018 Imaging   US Venous Doppler Right: No evidence of common femoral vein obstruction. Left: There is no evidence of deep vein thrombosis in the lower extremity. However, portions of this examination were limited- see technologist comments above. Left groin: Large hypoechoic area with mixed echoes noted measuring nearly 10 cm. Possible  hematoma versus unknown etiology. Ultrasound characteristics of enlarged lymph nodes noted in the groin.      05/15/2018 Imaging   US Venous Doppler Right: No evidence of deep vein thrombosis in the lower extremity. No indirect evidence of obstruction proximal to the inguinal ligament. Left: No reflux was noted in the common femoral vein , femoral vein in the thigh, popliteal vein, great saphenous vein at the saphenofemoral junction, great saphenous vein at the proximal thigh, great saphenous vein at the mid thigh, great saphenous vein  at the distal thigh, great saphenous vein at the knee, origin of the small saphenous vein, proximal small saphenous vein, and mid small saphenous vein. There is no evidence of deep vein thrombosis in the lower extremity. There is no evidence of superficial venous thrombosis. No cystic structure  found in the popliteal fossa. Unable to evaluate extension of common femoral vein obstruction proximal to the inguinal ligament.   06/01/2018 Imaging   1. 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 Additionally, there is lucency involving the medial sidewall of the left acetabulum with potential nondisplaced pathologic fracture. Further evaluation with contrast-enhanced pelvic MRI could be performed as clinically indicated. 2. The left pelvic arterial and venous system is encased by this infiltrative left pelvic sidewall mass however while difficult to ascertain, the left external iliac venous stent appears patent.   06/18/2018 Pathology Results   Lymph node for lymphoma, Left Inguinal - METASTATIC ADENOCARCINOMA, SEE COMMENT. Microscopic Comment Immunohistochemistry is positive for cytokeratin 7, PAX8, ER, and PR. Cytokeratin 5/6,and p63 are negative. The immunoprofile along with the patient's history are consistent with a gynecologic primary.   06/18/2018 Surgery   Pre-op Diagnosis: INGUINAL LYMPHADENOPATHY, PELVIC MASS     Procedure(s): EXCISIONAL BIOPSY DEEP LEFT INGUINAL LYMPH NODE  Surgeon(s): Coralie Keens, MD    06/24/2018 Cancer Staging   Staging form: Corpus Uteri - Carcinoma and Carcinosarcoma, AJCC 8th Edition - Clinical: Stage IVB (cT1a, cN2, pM1) - Signed by [name], MD on 06/24/2018    Genetic Testing   Patient has genetic testing done for MMR on pathology from 06/18/2018. Results revealed patient has the following mutation(s): MMR: abnormal   06/29/2018 Procedure   Placement of a subcutaneous port device. Catheter tip at the SVC and right atrium junction.    Genetic Testing   Patient has genetic testing done for MSI on pathology from 06/18/2018. Results revealed patient has the following mutation(s): MSI: High  07/02/2018 PET scan   Previous hysterectomy,  with asymmetric focus of hypermetabolic activity in the left vaginal cuff, suspicious for residual or recurrent carcinoma.  Large hypermetabolic soft tissue mass involving the left pelvic sidewall and acetabulum, consistent with metastatic disease.  No evidence metastatic disease within the abdomen, chest, or neck.   07/09/2018 Tumor Marker   Patient's tumor was tested for the following markers: CA-125 Results of the tumor marker test revealed 9   07/10/2018 - 08/24/2018 Chemotherapy   The patient had carboplatin and taxol x 3 cycles   07/17/2018 Genetic Testing   Negative genetic testing on the common hereditary cancer panel.  The Common Hereditary Gene Panel offered by [facility] includes sequencing and/or deletion duplication testing of the following 48 genes: APC, ATM, AXIN2, BARD1, BMPR1A, BRCA1, BRCA2, BRIP1, CDH1, CDK4, CDKN2A (p14ARF), CDKN2A (p16INK4a), CHEK2, CTNNA1, DICER1, EPCAM (Deletion/duplication testing only), GREM1 (promoter region deletion/duplication testing only), KIT, MEN1, MLH1, MSH2, MSH3, MSH6, MUTYH, NBN, NF1, NHTL1, PALB2, PDGFRA, PMS2, POLD1, POLE, PTEN, RAD50, RAD51C, RAD51D, RNF43, SDHB, SDHC, SDHD, SMAD4, SMARCA4. STK11, TP53, TSC1, TSC2, and VHL.  The following genes were evaluated for sequence changes only: SDHA and HOXB13 c.251G>A variant only. The report date is Jul 17, 2018.    10/03/2018 Imaging   CT abdomen and pelvis 1.  No acute intra-abdominal process. 2. Grossly unchanged left pelvic sidewall mass with osseous involvement of the medial acetabulum. Progressive mild displacement of the associated comminuted pathologic fracture involving the right acetabulum and puboacetabular junction.  3. New venous stents extending from the left common iliac vein origin to the proximal left common femoral vein. The stents are patent.   11/06/2018 -  Chemotherapy   The patient had pembrolizumab (KEYTRUDA) 200 mg in sodium chloride 0.9 % 50 mL chemo infusion, 200 mg,  Intravenous, Once, 1 of 6 cycles  for chemotherapy treatment.    Metastasis to lymph nodes ([place])  06/23/2018 Initial Diagnosis   Metastasis to lymph nodes ([place])   07/10/2018 - 09/14/2018 Chemotherapy   The patient had palonosetron (ALOXI) injection 0.25 mg, 0.25 mg, Intravenous,  Once, 3 of 6 cycles Administration: 0.25 mg (07/10/2018), 0.25 mg (07/31/2018), 0.25 mg (08/24/2018) CARBOplatin (PARAPLATIN) 480 mg in sodium chloride 0.9 % 250 mL chemo infusion, 480 mg (100 % of original dose 482.5 mg), Intravenous,  Once, 3 of 6 cycles Dose modification: 482.5 mg (original dose 482.5 mg, Cycle 1) Administration: 480 mg (07/10/2018), 480 mg (07/31/2018), 480 mg (08/24/2018) PACLitaxel (TAXOL) 276 mg in sodium chloride 0.9 % 250 mL chemo infusion (> 52m/m2), 140 mg/m2 = 276 mg (80 % of original dose 175 mg/m2), Intravenous,  Once, 3 of 6 cycles Dose modification: 140 mg/m2 (80 % of original dose 175 mg/m2, Cycle 1, Reason: Dose Not Tolerated) Administration: 276 mg (07/10/2018), 276 mg (07/31/2018), 276 mg (08/24/2018) fosaprepitant (EMEND) 150 mg, dexamethasone (DECADRON) 12 mg in sodium chloride 0.9 % 145 mL IVPB, , Intravenous,  Once, 3 of 6 cycles Administration:  (07/10/2018),  (07/31/2018),  (08/24/2018)  for chemotherapy treatment.    11/06/2018 -  Chemotherapy   The patient had pembrolizumab (KEYTRUDA) 200 mg in sodium chloride 0.9 % 50 mL chemo infusion, 200 mg, Intravenous, Once, 1 of 6 cycles  for chemotherapy treatment.    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  06/24/2018 Initial Diagnosis   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   07/10/2018 - 09/14/2018 Chemotherapy   The patient had palonosetron (ALOXI) injection 0.25 mg, 0.25 mg, Intravenous,  Once, 3 of 6 cycles Administration: 0.25 mg (  07/10/2018), 0.25 mg (07/31/2018), 0.25 mg (08/24/2018) CARBOplatin (PARAPLATIN) 480 mg in sodium chloride 0.9 % 250 mL chemo infusion, 480 mg (100 % of original dose 482.5 mg), Intravenous,  Once, 3 of 6 cycles Dose modification: 482.5 mg  (original dose 482.5 mg, Cycle 1) Administration: 480 mg (07/10/2018), 480 mg (07/31/2018), 480 mg (08/24/2018) PACLitaxel (TAXOL) 276 mg in sodium chloride 0.9 % 250 mL chemo infusion (> 63m/m2), 140 mg/m2 = 276 mg (80 % of original dose 175 mg/m2), Intravenous,  Once, 3 of 6 cycles Dose modification: 140 mg/m2 (80 % of original dose 175 mg/m2, Cycle 1, Reason: Dose Not Tolerated) Administration: 276 mg (07/10/2018), 276 mg (07/31/2018), 276 mg (08/24/2018) fosaprepitant (EMEND) 150 mg, dexamethasone (DECADRON) 12 mg in sodium chloride 0.9 % 145 mL IVPB, , Intravenous,  Once, 3 of 6 cycles Administration:  (07/10/2018),  (07/31/2018),  (08/24/2018)  for chemotherapy treatment.    11/06/2018 -  Chemotherapy   The patient had pembrolizumab (KEYTRUDA) 200 mg in sodium chloride 0.9 % 50 mL chemo infusion, 200 mg, Intravenous, Once, 1 of 6 cycles  for chemotherapy treatment.    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      REVIEW OF SYSTEMS:   Constitutional: Denies fevers, chills or abnormal weight loss Eyes: Denies blurriness of vision Ears, nose, mouth, throat, and face: Denies mucositis or sore throat Respiratory: Denies cough, dyspnea or wheezes Cardiovascular: Denies palpitation, chest discomfort or lower extremity swelling Gastrointestinal:  Denies nausea, heartburn or change in bowel habits Skin: Denies abnormal skin rashes Lymphatics: Denies new lymphadenopathy or easy bruising Neurological:Denies numbness, tingling or new weaknesses Behavioral/Psych: Mood is stable, no new changes  All other systems were reviewed with the patient and are  negative.  I have reviewed the past medical history, past surgical history, social history and family history with the patient and they are unchanged from previous note.  ALLERGIES:  has No Known Allergies.  MEDICATIONS:  Current Outpatient Medications  Medication Sig Dispense Refill  . clopidogrel (PLAVIX) 75 MG tablet Take 1 tablet (75 mg total) by mouth daily with breakfast. 30 tablet 1  . Eliquis DVT/PE Starter Pack (ELIQUIS STARTER PACK) 5 MG TABS Take as directed on package: start with two-553mtablets twice daily for 7 days. On day 8, switch to one-3m47mablet twice daily. 1 each 0  . morphine (MSIR) 15 MG tablet Take 1 tablet (15 mg total) by mouth every 6 (six) hours as needed for severe pain. 60 tablet 0  . Olopatadine HCl 0.2 % SOLN Place 1 drop into both eyes daily.    . ondansetron (ZOFRAN ODT) 4 MG disintegrating tablet Take 1 tablet (4 mg total) by mouth every 8 (eight) hours as needed. 10 tablet 0   No current facility-administered medications for this visit.    Facility-Administered Medications Ordered in Other Visits  Medication Dose Route Frequency Provider Last Rate Last Dose  . heparin lock flush 100 unit/mL  500 Units Intracatheter Once PRN GorAlvy Bimleri, MD      . pembrolizumab (KEYTRUDA) 200 mg in sodium chloride 0.9 % 50 mL chemo infusion  200 mg Intravenous Once GorHeath LarkD 116 mL/hr at 11/06/18 1413 200 mg  at 11/06/18 1413  . sodium chloride flush (NS) 0.9 % injection 10 mL  10 mL Intracatheter PRN Alvy Bimler, Samra Pesch, MD        PHYSICAL EXAMINATION: ECOG PERFORMANCE STATUS: 2 - Symptomatic, <50% confined to bed  Vitals:   11/06/18 1123  BP: (!) 123/54  Pulse: 98  Resp: 17  Temp: 98.3 F (36.8 C)  SpO2: 100%   Filed Weights    GENERAL:alert, no distress and comfortable SKIN: skin color, texture, turgor are normal, no rashes or significant lesions EYES: normal, Conjunctiva are pink and non-injected, sclera clear OROPHARYNX:no exudate, no erythema and lips,  buccal mucosa, and tongue normal  NECK: supple, thyroid normal size, non-tender, without nodularity LYMPH:  no palpable lymphadenopathy in the cervical, axillary or inguinal LUNGS: clear to auscultation and percussion with normal breathing effort HEART: regular rate & rhythm and no murmurs with chronic bilateral lower extremity edema ABDOMEN:abdomen soft, non-tender and normal bowel sounds Musculoskeletal:no cyanosis of digits and no clubbing  NEURO: alert & oriented x 3 with fluent speech, no focal motor/sensory deficits  LABORATORY DATA:  I have reviewed the data as listed    Component Value Date/Time   NA 139 11/06/2018 1054   K 3.6 11/06/2018 1054   CL 106 11/06/2018 1054   CO2 25 11/06/2018 1054   GLUCOSE 97 11/06/2018 1054   BUN 7 (L) 11/06/2018 1054   CREATININE 0.71 11/06/2018 1054   CALCIUM 9.1 11/06/2018 1054   PROT 7.0 11/06/2018 1054   ALBUMIN 3.4 (L) 11/06/2018 1054   AST 11 (L) 11/06/2018 1054   ALT <6 11/06/2018 1054   ALKPHOS 77 11/06/2018 1054   BILITOT 0.5 11/06/2018 1054   GFRNONAA >60 11/06/2018 1054   GFRAA >60 11/06/2018 1054    No results found for: SPEP, UPEP  Lab Results  Component Value Date   WBC 3.2 (L) 11/06/2018   NEUTROABS 2.3 11/06/2018   HGB 8.9 (L) 11/06/2018   HCT 28.2 (L) 11/06/2018   MCV 94.9 11/06/2018   PLT 246 11/06/2018      Chemistry      Component Value Date/Time   NA 139 11/06/2018 1054   K 3.6 11/06/2018 1054   CL 106 11/06/2018 1054   CO2 25 11/06/2018 1054   BUN 7 (L) 11/06/2018 1054   CREATININE 0.71 11/06/2018 1054      Component Value Date/Time   CALCIUM 9.1 11/06/2018 1054   ALKPHOS 77 11/06/2018 1054   AST 11 (L) 11/06/2018 1054   ALT <6 11/06/2018 1054   BILITOT 0.5 11/06/2018 1054       RADIOGRAPHIC STUDIES: I have personally reviewed the radiological images as listed and agreed with the findings in the report. Vas [place] Ivc/iliac (venous Only)  Result Date: 10/09/2018 IVC/ILIAC STUDY Indications:  Surgery date 09/08/2018. Left iliac stent Vascular Interventions: Mechanical thrombectomy of left common external iliac                         veins and left common femoral vein and femoral veins;                         Stent of left common external iliac veins with 14 x 81m                         Vici and stent of left external iliac vein and common  femoral vein with 14 x 90 Wallstent. Limitations: Air/bowel gas, obesity, patient discomfort and patient positioning.  Performing Technologist: Burley Saver RVT  Examination Guidelines: A complete evaluation includes B-mode imaging, spectral Doppler, color Doppler, and power Doppler as needed of all accessible portions of each vessel. Bilateral testing is considered an integral part of a complete examination. Limited examinations for reoccurring indications may be performed as noted.  IVC/Iliac Findings: +----------+------+--------+--------------+    IVC    PatentThrombus   Comments    +----------+------+--------+--------------+ IVC Prox  patent                       +----------+------+--------+--------------+ IVC Mid   patent                       +----------+------+--------+--------------+ IVC Distal              not visualized +----------+------+--------+--------------+  +-------------------+---------+-----------+---------+-----------+--------+         CIV        RT-PatentRT-ThrombusLT-PatentLT-ThrombusComments +-------------------+---------+-----------+---------+-----------+--------+ Common Iliac Prox                       patent                      +-------------------+---------+-----------+---------+-----------+--------+ Common Iliac Mid                        patent                      +-------------------+---------+-----------+---------+-----------+--------+ Common Iliac Distal                     patent                       +-------------------+---------+-----------+---------+-----------+--------+ +-------------------------+---------+-----------+---------+-----------+--------+            EIV           RT-PatentRT-ThrombusLT-PatentLT-ThrombusComments +-------------------------+---------+-----------+---------+-----------+--------+ External Iliac Vein Prox                      patent                      +-------------------------+---------+-----------+---------+-----------+--------+ External Iliac Vein Mid                       patent                      +-------------------------+---------+-----------+---------+-----------+--------+ External Iliac Vein                           patent                      Distal                                                                    +-------------------------+---------+-----------+---------+-----------+--------+  Summary: IVC/Iliac: There is no evidence of thrombus involving the IVC. Suboptimal visualization. Patent left common and external iliac vein stents. Limited evaluation of left lower extremity due to patient's request not to  remove bandage. Common femoral, proximal femoral and profunda femoral veins were partially compressible. 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 Great saphenous vein at the saphenofemoral junction appears occluded, with flow reconstituting distally.  *See table(s) above for measurements and observations.  Electronically signed by Servando Snare MD on 10/09/2018 at 9:19:53 AM.    Final     All questions were answered. The patient knows to call the clinic with any problems, questions or concerns. No barriers to learning was detected.  I spent 25 minutes counseling the patient face to face. The total time spent in the appointment was 30 minutes and more than 50% was on counseling and review of test results  [name], MD 11/06/2018 2:28 PM

## 2018-11-06 NOTE — Assessment & Plan Note (Signed)
I have extensive discussion about pain management I recommend she take IR  morphine as needed for now If she needs more than 4 doses per day, I will start her back on MS Contin

## 2018-11-06 NOTE — Patient Instructions (Signed)
[facility] Discharge Instructions for Patients Receiving Chemotherapy  Today you received the following chemotherapy agents Pembrolizumab [facility]).  To help prevent nausea and vomiting after your treatment, we encourage you to take your nausea medication as prescribed.   If you develop nausea and vomiting that is not controlled by your nausea medication, call the clinic.   BELOW ARE SYMPTOMS THAT SHOULD BE REPORTED IMMEDIATELY:  *FEVER GREATER THAN 100.5 F  *CHILLS WITH OR WITHOUT FEVER  NAUSEA AND VOMITING THAT IS NOT CONTROLLED WITH YOUR NAUSEA MEDICATION  *UNUSUAL SHORTNESS OF BREATH  *UNUSUAL BRUISING OR BLEEDING  TENDERNESS IN MOUTH AND THROAT WITH OR WITHOUT PRESENCE OF ULCERS  *URINARY PROBLEMS  *BOWEL PROBLEMS  UNUSUAL RASH Items with * indicate a potential emergency and should be followed up as soon as possible.  Feel free to call the clinic should you have any questions or concerns. The clinic phone number is [phone].  Please show the [place] at check-in to the Emergency Department and triage nurse.  Pembrolizumab injection What is this medicine? PEMBROLIZUMAB (pem broe liz ue mab) is a monoclonal antibody. It is used to treat bladder cancer, cervical cancer, endometrial cancer, esophageal cancer, head and neck cancer, hepatocellular cancer, Hodgkin lymphoma, kidney cancer, lymphoma, melanoma, Merkel cell carcinoma, lung cancer, stomach cancer, urothelial cancer, and cancers that have a certain genetic condition. This medicine may be used for other purposes; ask your health care provider or pharmacist if you have questions. COMMON BRAND NAME(S): Keytruda What should I tell my health care provider before I take this medicine? They need to know if you have any of these conditions:  diabetes  immune system problems  inflammatory bowel disease  liver disease  lung or breathing disease  lupus  received or scheduled to receive  an organ transplant or a stem-cell transplant that uses donor stem cells  an unusual or allergic reaction to pembrolizumab, other medicines, foods, dyes, or preservatives  pregnant or trying to get pregnant  breast-feeding How should I use this medicine? This medicine is for infusion into a vein. It is given by a health care professional in a hospital or clinic setting. A special MedGuide will be given to you before each treatment. Be sure to read this information carefully each time. Talk to your pediatrician regarding the use of this medicine in children. While this drug may be prescribed for selected conditions, precautions do apply. Overdosage: If you think you have taken too much of this medicine contact a poison control center or emergency room at once. NOTE: This medicine is only for you. Do not share this medicine with others. What if I miss a dose? It is important not to miss your dose. Call your doctor or health care professional if you are unable to keep an appointment. What may interact with this medicine? Interactions have not been studied. Give your health care provider a list of all the medicines, herbs, non-prescription drugs, or dietary supplements you use. Also tell them if you smoke, drink alcohol, or use illegal drugs. Some items may interact with your medicine. This list may not describe all possible interactions. Give your health care provider a list of all the medicines, herbs, non-prescription drugs, or dietary supplements you use. Also tell them if you smoke, drink alcohol, or use illegal drugs. Some items may interact with your medicine. What should I watch for while using this medicine? Your condition will be monitored carefully while you are receiving this medicine. You may  need blood work done while you are taking this medicine. Do not become pregnant while taking this medicine or for 4 months after stopping it. Women should inform their doctor if they wish to  become pregnant or think they might be pregnant. There is a potential for serious side effects to an unborn child. Talk to your health care professional or pharmacist for more information. Do not breast-feed an infant while taking this medicine or for 4 months after the last dose. What side effects may I notice from receiving this medicine? Side effects that you should report to your doctor or health care professional as soon as possible:  allergic reactions like skin rash, itching or hives, swelling of the face, lips, or tongue  bloody or black, tarry  breathing problems  changes in vision  chest pain  chills  confusion  constipation  cough  diarrhea  dizziness or feeling faint or lightheaded  fast or irregular heartbeat  fever  flushing  hair loss  joint pain  low blood counts - this medicine may decrease the number of white blood cells, red blood cells and platelets. You may be at increased risk for infections and bleeding.  muscle pain  muscle weakness  persistent headache  redness, blistering, peeling or loosening of the skin, including inside the mouth  signs and symptoms of high blood sugar such as dizziness; dry mouth; dry skin; fruity breath; nausea; stomach pain; increased hunger or thirst; increased urination  signs and symptoms of kidney injury like trouble passing urine or change in the amount of urine  signs and symptoms of liver injury like dark urine, light-colored stools, loss of appetite, nausea, right upper belly pain, yellowing of the eyes or skin  sweating  swollen lymph nodes  weight loss Side effects that usually do not require medical attention (report to your doctor or health care professional if they continue or are bothersome):  decreased appetite  muscle pain  tiredness This list may not describe all possible side effects. Call your doctor for medical advice about side effects. You may report side effects to FDA at  1-800-FDA-1088. Where should I keep my medicine? This drug is given in a hospital or clinic and will not be stored at home. NOTE: This sheet is a summary. It may not cover all possible information. If you have questions about this medicine, talk to your doctor, pharmacist, or health care provider.  2020 Elsevier/Gold Standard (2018-03-03 13:46:58)  Coronavirus (COVID-19) Are you at risk?  Are you at risk for the Coronavirus (COVID-19)?  To be considered HIGH RISK for Coronavirus (COVID-19), you have to meet the following criteria:  . Traveled to [place], [place], [place], [place] or [place]; or in the [place] to [place], [place], [place], or [place]; and have fever, cough, and shortness of breath within the last 2 weeks of travel OR . Been in close contact with a person diagnosed with COVID-19 within the last 2 weeks and have fever, cough, and shortness of breath . IF YOU DO NOT MEET THESE CRITERIA, YOU ARE CONSIDERED LOW RISK FOR COVID-19.  What to do if you are HIGH RISK for COVID-19?  Marland Kitchen If you are having a medical emergency, call 911. . Seek medical care right away. Before you go to a doctor's office, urgent care or emergency department, call ahead and tell them about your recent travel, contact with someone diagnosed with COVID-19, and your symptoms. You should receive instructions from your physician's office regarding next  steps of care.  . When you arrive at healthcare provider, tell the healthcare staff immediately you have returned from visiting [place], [place], [place], [place] or [place]; or traveled in the [place] to [place], [place], [place], or [place]; in the last two weeks or you have been in close contact with a person diagnosed with COVID-19 in the last 2 weeks.   . Tell the health care staff about your symptoms: fever, cough and shortness of breath. . After you have been seen by a medical provider, you will be either: o Tested for (COVID-19) and  discharged home on quarantine except to seek medical care if symptoms worsen, and asked to  - Stay home and avoid contact with others until you get your results (4-5 days)  - Avoid travel on public transportation if possible (such as bus, train, or airplane) or o Sent to the Emergency Department by EMS for evaluation, COVID-19 testing, and possible admission depending on your condition and test results.  What to do if you are LOW RISK for COVID-19?  Reduce your risk of any infection by using the same precautions used for avoiding the common cold or flu:  Marland Kitchen Wash your hands often with soap and warm water for at least 20 seconds.  If soap and water are not readily available, use an alcohol-based hand sanitizer with at least 60% alcohol.  . If coughing or sneezing, cover your mouth and nose by coughing or sneezing into the elbow areas of your shirt or coat, into a tissue or into your sleeve (not your hands). . Avoid shaking hands with others and consider head nods or verbal greetings only. . Avoid touching your eyes, nose, or mouth with unwashed hands.  . Avoid close contact with people who are sick. . Avoid places or events with large numbers of people in one location, like concerts or sporting events. . Carefully consider travel plans you have or are making. . If you are planning any travel outside or inside the [place], visit the CDC's Travelers' Health webpage for the latest health notices. . If you have some symptoms but not all symptoms, continue to monitor at home and seek medical attention if your symptoms worsen. . If you are having a medical emergency, call 911.   [place] / e-Visit: [email or web address]         [facility]: [facility]: [id number]                   [facility]: [phone]

## 2018-11-06 NOTE — Assessment & Plan Note (Signed)
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 

## 2018-11-07 LAB — T4: T4, Total: 8 ug/dL (ref 4.5–12.0)

## 2018-11-09 ENCOUNTER — Telehealth: Payer: Self-pay | Admitting: Hematology and Oncology

## 2018-11-09 NOTE — Telephone Encounter (Signed)
I talk with patient regarding schedule  

## 2018-11-18 ENCOUNTER — Other Ambulatory Visit: Payer: Medicare Other

## 2018-11-18 ENCOUNTER — Ambulatory Visit: Payer: Medicare Other

## 2018-11-18 ENCOUNTER — Ambulatory Visit: Payer: Medicare Other | Admitting: Hematology and Oncology

## 2018-11-19 ENCOUNTER — Telehealth: Payer: Self-pay | Admitting: *Deleted

## 2018-11-19 NOTE — Telephone Encounter (Signed)
-----   Message from [name], MD sent at 11/17/2018  9:14 AM EDT ----- Regarding: can you call her son and ask how she is doing in terms of pain?

## 2018-11-19 NOTE — Telephone Encounter (Signed)
Patient's son states patient still is having pain but it is controlled by her current pain medication. He will call this office is there is a change.

## 2018-11-27 ENCOUNTER — Other Ambulatory Visit: Payer: Self-pay

## 2018-11-27 ENCOUNTER — Inpatient Hospital Stay: Payer: Medicare Other | Attending: Hematology and Oncology

## 2018-11-27 ENCOUNTER — Encounter: Payer: Self-pay | Admitting: Hematology and Oncology

## 2018-11-27 ENCOUNTER — Inpatient Hospital Stay: Payer: Medicare Other

## 2018-11-27 ENCOUNTER — Inpatient Hospital Stay (HOSPITAL_BASED_OUTPATIENT_CLINIC_OR_DEPARTMENT_OTHER): Payer: Medicare Other | Admitting: Hematology and Oncology

## 2018-11-27 DIAGNOSIS — M79606 Pain in leg, unspecified: Secondary | ICD-10-CM | POA: Diagnosis not present

## 2018-11-27 DIAGNOSIS — I825Z2 Chronic embolism and thrombosis of unspecified deep veins of left distal lower extremity: Secondary | ICD-10-CM

## 2018-11-27 DIAGNOSIS — C774 Secondary and unspecified malignant neoplasm of inguinal and lower limb lymph nodes: Secondary | ICD-10-CM

## 2018-11-27 DIAGNOSIS — D61818 Other pancytopenia: Secondary | ICD-10-CM

## 2018-11-27 DIAGNOSIS — C55 Malignant neoplasm of uterus, part unspecified: Secondary | ICD-10-CM

## 2018-11-27 DIAGNOSIS — G893 Neoplasm related pain (acute) (chronic): Secondary | ICD-10-CM

## 2018-11-27 DIAGNOSIS — C7951 Secondary malignant neoplasm of bone: Secondary | ICD-10-CM

## 2018-11-27 DIAGNOSIS — R634 Abnormal weight loss: Secondary | ICD-10-CM | POA: Insufficient documentation

## 2018-11-27 DIAGNOSIS — C541 Malignant neoplasm of endometrium: Secondary | ICD-10-CM | POA: Insufficient documentation

## 2018-11-27 DIAGNOSIS — Z5112 Encounter for antineoplastic immunotherapy: Secondary | ICD-10-CM | POA: Insufficient documentation

## 2018-11-27 DIAGNOSIS — C801 Malignant (primary) neoplasm, unspecified: Secondary | ICD-10-CM

## 2018-11-27 DIAGNOSIS — Z7189 Other specified counseling: Secondary | ICD-10-CM

## 2018-11-27 DIAGNOSIS — Z86718 Personal history of other venous thrombosis and embolism: Secondary | ICD-10-CM | POA: Insufficient documentation

## 2018-11-27 DIAGNOSIS — Z79899 Other long term (current) drug therapy: Secondary | ICD-10-CM | POA: Diagnosis not present

## 2018-11-27 LAB — CBC WITH DIFFERENTIAL (CANCER CENTER ONLY)
Abs Immature Granulocytes: 0.02 10*3/uL (ref 0.00–0.07)
Basophils Absolute: 0 10*3/uL (ref 0.0–0.1)
Basophils Relative: 0 %
Eosinophils Absolute: 0.1 10*3/uL (ref 0.0–0.5)
Eosinophils Relative: 2 %
HCT: 29.7 % — ABNORMAL LOW (ref 36.0–46.0)
Hemoglobin: 9.3 g/dL — ABNORMAL LOW (ref 12.0–15.0)
Immature Granulocytes: 1 %
Lymphocytes Relative: 20 %
Lymphs Abs: 0.6 10*3/uL — ABNORMAL LOW (ref 0.7–4.0)
MCH: 29.2 pg (ref 26.0–34.0)
MCHC: 31.3 g/dL (ref 30.0–36.0)
MCV: 93.4 fL (ref 80.0–100.0)
Monocytes Absolute: 0.3 10*3/uL (ref 0.1–1.0)
Monocytes Relative: 11 %
Neutro Abs: 2.1 10*3/uL (ref 1.7–7.7)
Neutrophils Relative %: 66 %
Platelet Count: 254 10*3/uL (ref 150–400)
RBC: 3.18 MIL/uL — ABNORMAL LOW (ref 3.87–5.11)
RDW: 15.5 % (ref 11.5–15.5)
WBC Count: 3.1 10*3/uL — ABNORMAL LOW (ref 4.0–10.5)
nRBC: 0 % (ref 0.0–0.2)

## 2018-11-27 LAB — TSH: TSH: 0.787 u[IU]/mL (ref 0.308–3.960)

## 2018-11-27 LAB — CMP (CANCER CENTER ONLY)
ALT: 6 U/L (ref 0–44)
AST: 10 U/L — ABNORMAL LOW (ref 15–41)
Albumin: 3.5 g/dL (ref 3.5–5.0)
Alkaline Phosphatase: 83 U/L (ref 38–126)
Anion gap: 9 (ref 5–15)
BUN: 11 mg/dL (ref 8–23)
CO2: 26 mmol/L (ref 22–32)
Calcium: 9.4 mg/dL (ref 8.9–10.3)
Chloride: 104 mmol/L (ref 98–111)
Creatinine: 0.65 mg/dL (ref 0.44–1.00)
GFR, Est AFR Am: >60 mL/min (ref >60)
GFR, Estimated: >60 mL/min (ref >60)
Glucose, Bld: 99 mg/dL (ref 70–99)
Potassium: 3.8 mmol/L (ref 3.5–5.1)
Sodium: 139 mmol/L (ref 135–145)
Total Bilirubin: 0.4 mg/dL (ref 0.3–1.2)
Total Protein: 7.2 g/dL (ref 6.5–8.1)

## 2018-11-27 MED ORDER — SODIUM CHLORIDE 0.9 % IV SOLN
200.0000 mg | Freq: Once | INTRAVENOUS | Status: AC
  Administered 2018-11-27: 200 mg via INTRAVENOUS
  Filled 2018-11-27: qty 8

## 2018-11-27 MED ORDER — SODIUM CHLORIDE 0.9% FLUSH
10.0000 mL | Freq: Once | INTRAVENOUS | Status: AC
  Administered 2018-11-27: 10 mL
  Filled 2018-11-27: qty 10

## 2018-11-27 MED ORDER — SODIUM CHLORIDE 0.9% FLUSH
10.0000 mL | INTRAVENOUS | Status: DC | PRN
  Administered 2018-11-27: 10 mL
  Filled 2018-11-27: qty 10

## 2018-11-27 MED ORDER — SODIUM CHLORIDE 0.9 % IV SOLN
Freq: Once | INTRAVENOUS | Status: AC
  Administered 2018-11-27: 12:00:00 via INTRAVENOUS
  Filled 2018-11-27: qty 250

## 2018-11-27 MED ORDER — MORPHINE SULFATE 15 MG PO TABS: 15.0000 mg | ORAL_TABLET | Freq: Four times a day (QID) | ORAL | 0 refills | Status: DC | PRN

## 2018-11-27 MED ORDER — HEPARIN SOD (PORK) LOCK FLUSH 100 UNIT/ML IV SOLN
500.0000 [IU] | Freq: Once | INTRAVENOUS | Status: AC | PRN
  Administered 2018-11-27: 500 [IU]
  Filled 2018-11-27: qty 5

## 2018-11-27 NOTE — Patient Instructions (Signed)
[facility] Discharge Instructions for Patients Receiving Chemotherapy  Today you received the following chemotherapy agents Pembrolizumab [facility]).  To help prevent nausea and vomiting after your treatment, we encourage you to take your nausea medication as prescribed.   If you develop nausea and vomiting that is not controlled by your nausea medication, call the clinic.   BELOW ARE SYMPTOMS THAT SHOULD BE REPORTED IMMEDIATELY:  *FEVER GREATER THAN 100.5 F  *CHILLS WITH OR WITHOUT FEVER  NAUSEA AND VOMITING THAT IS NOT CONTROLLED WITH YOUR NAUSEA MEDICATION  *UNUSUAL SHORTNESS OF BREATH  *UNUSUAL BRUISING OR BLEEDING  TENDERNESS IN MOUTH AND THROAT WITH OR WITHOUT PRESENCE OF ULCERS  *URINARY PROBLEMS  *BOWEL PROBLEMS  UNUSUAL RASH Items with * indicate a potential emergency and should be followed up as soon as possible.  Feel free to call the clinic should you have any questions or concerns. The clinic phone number is [phone].  Please show the [place] at check-in to the Emergency Department and triage nurse.  Pembrolizumab injection What is this medicine? PEMBROLIZUMAB (pem broe liz ue mab) is a monoclonal antibody. It is used to treat bladder cancer, cervical cancer, endometrial cancer, esophageal cancer, head and neck cancer, hepatocellular cancer, Hodgkin lymphoma, kidney cancer, lymphoma, melanoma, Merkel cell carcinoma, lung cancer, stomach cancer, urothelial cancer, and cancers that have a certain genetic condition. This medicine may be used for other purposes; ask your health care provider or pharmacist if you have questions. COMMON BRAND NAME(S): Keytruda What should I tell my health care provider before I take this medicine? They need to know if you have any of these conditions:  diabetes  immune system problems  inflammatory bowel disease  liver disease  lung or breathing disease  lupus  received or scheduled to receive  an organ transplant or a stem-cell transplant that uses donor stem cells  an unusual or allergic reaction to pembrolizumab, other medicines, foods, dyes, or preservatives  pregnant or trying to get pregnant  breast-feeding How should I use this medicine? This medicine is for infusion into a vein. It is given by a health care professional in a hospital or clinic setting. A special MedGuide will be given to you before each treatment. Be sure to read this information carefully each time. Talk to your pediatrician regarding the use of this medicine in children. While this drug may be prescribed for selected conditions, precautions do apply. Overdosage: If you think you have taken too much of this medicine contact a poison control center or emergency room at once. NOTE: This medicine is only for you. Do not share this medicine with others. What if I miss a dose? It is important not to miss your dose. Call your doctor or health care professional if you are unable to keep an appointment. What may interact with this medicine? Interactions have not been studied. Give your health care provider a list of all the medicines, herbs, non-prescription drugs, or dietary supplements you use. Also tell them if you smoke, drink alcohol, or use illegal drugs. Some items may interact with your medicine. This list may not describe all possible interactions. Give your health care provider a list of all the medicines, herbs, non-prescription drugs, or dietary supplements you use. Also tell them if you smoke, drink alcohol, or use illegal drugs. Some items may interact with your medicine. What should I watch for while using this medicine? Your condition will be monitored carefully while you are receiving this medicine. You may  need blood work done while you are taking this medicine. Do not become pregnant while taking this medicine or for 4 months after stopping it. Women should inform their doctor if they wish to  become pregnant or think they might be pregnant. There is a potential for serious side effects to an unborn child. Talk to your health care professional or pharmacist for more information. Do not breast-feed an infant while taking this medicine or for 4 months after the last dose. What side effects may I notice from receiving this medicine? Side effects that you should report to your doctor or health care professional as soon as possible:  allergic reactions like skin rash, itching or hives, swelling of the face, lips, or tongue  bloody or black, tarry  breathing problems  changes in vision  chest pain  chills  confusion  constipation  cough  diarrhea  dizziness or feeling faint or lightheaded  fast or irregular heartbeat  fever  flushing  hair loss  joint pain  low blood counts - this medicine may decrease the number of white blood cells, red blood cells and platelets. You may be at increased risk for infections and bleeding.  muscle pain  muscle weakness  persistent headache  redness, blistering, peeling or loosening of the skin, including inside the mouth  signs and symptoms of high blood sugar such as dizziness; dry mouth; dry skin; fruity breath; nausea; stomach pain; increased hunger or thirst; increased urination  signs and symptoms of kidney injury like trouble passing urine or change in the amount of urine  signs and symptoms of liver injury like dark urine, light-colored stools, loss of appetite, nausea, right upper belly pain, yellowing of the eyes or skin  sweating  swollen lymph nodes  weight loss Side effects that usually do not require medical attention (report to your doctor or health care professional if they continue or are bothersome):  decreased appetite  muscle pain  tiredness This list may not describe all possible side effects. Call your doctor for medical advice about side effects. You may report side effects to FDA at  1-800-FDA-1088. Where should I keep my medicine? This drug is given in a hospital or clinic and will not be stored at home. NOTE: This sheet is a summary. It may not cover all possible information. If you have questions about this medicine, talk to your doctor, pharmacist, or health care provider.  2020 Elsevier/Gold Standard (2018-03-03 13:46:58)  Coronavirus (COVID-19) Are you at risk?  Are you at risk for the Coronavirus (COVID-19)?  To be considered HIGH RISK for Coronavirus (COVID-19), you have to meet the following criteria:  . Traveled to [place], [place], [place], [place] or [place]; or in the [place] to [place], [place], [place], or [place]; and have fever, cough, and shortness of breath within the last 2 weeks of travel OR . Been in close contact with a person diagnosed with COVID-19 within the last 2 weeks and have fever, cough, and shortness of breath . IF YOU DO NOT MEET THESE CRITERIA, YOU ARE CONSIDERED LOW RISK FOR COVID-19.  What to do if you are HIGH RISK for COVID-19?  Marland Kitchen If you are having a medical emergency, call 911. . Seek medical care right away. Before you go to a doctor's office, urgent care or emergency department, call ahead and tell them about your recent travel, contact with someone diagnosed with COVID-19, and your symptoms. You should receive instructions from your physician's office regarding next  steps of care.  . When you arrive at healthcare provider, tell the healthcare staff immediately you have returned from visiting [place], [place], [place], [place] or [place]; or traveled in the [place] to [place], [place], [place], or [place]; in the last two weeks or you have been in close contact with a person diagnosed with COVID-19 in the last 2 weeks.   . Tell the health care staff about your symptoms: fever, cough and shortness of breath. . After you have been seen by a medical provider, you will be either: o Tested for (COVID-19) and  discharged home on quarantine except to seek medical care if symptoms worsen, and asked to  - Stay home and avoid contact with others until you get your results (4-5 days)  - Avoid travel on public transportation if possible (such as bus, train, or airplane) or o Sent to the Emergency Department by EMS for evaluation, COVID-19 testing, and possible admission depending on your condition and test results.  What to do if you are LOW RISK for COVID-19?  Reduce your risk of any infection by using the same precautions used for avoiding the common cold or flu:  Marland Kitchen Wash your hands often with soap and warm water for at least 20 seconds.  If soap and water are not readily available, use an alcohol-based hand sanitizer with at least 60% alcohol.  . If coughing or sneezing, cover your mouth and nose by coughing or sneezing into the elbow areas of your shirt or coat, into a tissue or into your sleeve (not your hands). . Avoid shaking hands with others and consider head nods or verbal greetings only. . Avoid touching your eyes, nose, or mouth with unwashed hands.  . Avoid close contact with people who are sick. . Avoid places or events with large numbers of people in one location, like concerts or sporting events. . Carefully consider travel plans you have or are making. . If you are planning any travel outside or inside the [place], visit the CDC's Travelers' Health webpage for the latest health notices. . If you have some symptoms but not all symptoms, continue to monitor at home and seek medical attention if your symptoms worsen. . If you are having a medical emergency, call 911.   [place] / e-Visit: [email or web address]         [facility]: [facility]: [id number]                   [facility]: [phone]

## 2018-11-27 NOTE — Progress Notes (Signed)
[place] OFFICE PROGRESS NOTE  Patient Care Team: Nolene Ebbs, MD as PCP - General (Internal Medicine)  ASSESSMENT & PLAN:  Uterine cancer [facility]) So far, she tolerated pembrolizumab well She denies side effects from treatment She have lost significant of weight compared to her weight in August but her leg swelling has improved We will continue treatment without dose adjustment I recommend minimum 3 cycles of treatment before repeat imaging study next month  Cancer associated pain Leg pain is well controlled with morphine sulfate I refilled her prescription today We discussed narcotic refill policy  Lower leg DVT (deep venous thromboembolism), chronic, left ([place]) She denies worsening leg swelling after radiation treatment She will continue chronic anticoagulation therapy indefinitely  Pancytopenia, acquired ([place]) She has mild pancytopenia due to treatment She is not symptomatic Observe only for now   No orders of the defined types were placed in this encounter.   INTERVAL HISTORY: Please see below for problem oriented charting. She returns for cycle 2 of treatment She feels well She continues to have intermittent leg pain but it is not bothering her much She has lost a lot of fluid weight and she did noticed that her leg swelling has improved Her wound has healed The patient denies any recent signs or symptoms of bleeding such as spontaneous epistaxis, hematuria or hematochezia. 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 Uterus +/- tubes/ovaries, neoplastic, with left fallopian tube and ovary - INVASIVE ENDOMETRIOID CARCINOMA (1.5 CM), FIGO GRADE II, ARISING IN A BACKGROUND OF ATYPICAL COMPLEX  HYPERPLASIA, CONFINED WITHIN INNER HALF OF THE MYOMETRIUM. - ENDOMETRIAL POLYP WITH ASSOCIATED ATYPICAL COMPLEX HYPERPLASIA. - MYOMETRIUM: LEIOMYOMATA. - CERVIX: BENIGN SQUAMOUS MUCOSA AND ENDOCERVICAL MUCOSA, NO DYSPLASIA OR MALIGNANCY. - LEFT OVARY: BENIGN OVARIAN TISSUE WITH ENDOSALPINGOSIS, NO EVIDENCE OF ATYPIA OR MALIGNANCY. - LEFT FALLOPIAN TUBE: NO HISTOLOGIC ABNORMALITIES. - PLEASE SEE ONCOLOGY TEMPLATE FOR DETAIL. 2. Ovary and fallopian tube, right - BENIGN OVARIAN TISSUE WITH ENDOSALPINGOSIS, NO ATYPIA OR MALIGNANCY. - BENIGN FALLOPIAN TUBAL TISSUE, NO PATHOLOGIC ABNORMALITIES. Microscopic Comment 1. UTERUS Specimen: Uterus, cervix, bilateral ovaries and fallopian tubes Procedure: Total hysterectomy and bilateral salpingo-oophorectomy Lymph node sampling performed: No Specimen integrity: Intact Maximum tumor size (cm): 1.5 cm, glass slide measurement Histologic type: Invasive endometrioid carcinoma Grade: FIGO grade II Myometrial invasion: 1 cm where myometrium is 2.3 cm in thickness Cervical stromal involvement: No Extent of involvement of other organs: No Lymph vascular invasion: Not identified Peritoneal washings: Negative ([id number]) Lymph nodes: number examined N/A; number positive N/A TNM code: pT1a, pNX 1 oFf 3IGO Stage (based on pathologic findings, needs clinical correlation): IA  Comments: Sections the endomyometrium away from the grossly identified endometrial polyp show an invasive FIGO grade II endometrioid carcinoma. The tumor is confined within inner half of the myometrium. No angiolymphatic invasion is identified. No cervical stromal involvement is identified. Sections of the grossly identified endometrial polyp show an endometrial polyp with associated atypical compacted hyperplasia with no definitive evidence of carcinoma.   12/07/2017 Imaging   US venous Doppler Right: No evidence of common femoral vein obstruction. Left: Findings consistent with acute  deep vein thrombosis involving the left femoral vein, left proximal profunda vein, and left popliteal vein. Unable to adequately interrogate the common femoral  and higher, or the calf secondary to significant edema and body habitus   12/07/2017 [facility] Admission   She presented to the ER and was diagnosed with acute DVT   01/18/2018 - 01/21/2018 Hospital Admission   She was admitted to the hospital for management of severe persistent DVT   01/18/2018 Imaging   US venous Doppler Right: No evidence of common femoral vein obstruction. Left: Findings consistent with acute deep vein thrombosis involving the left common femoral vein, and left popliteal vein.   01/19/2018 Surgery   Pre-operative Diagnosis: Subacute DVT with severe post thrombotic syndrome Post-operative diagnosis:  Same Surgeon:  Erlene Quan C. Donzetta Matters, MD Procedure Performed: 1.  Ultrasound-guided cannulation left small saphenous vein 2.  Left lower extremity and central venography 3.  Intravascular ultrasound of left popliteal, femoral, common femoral, external and common iliac veins and IVC 4.  Stent of left common and external iliac veins with 14 x 60 mm Vici 5.  Moderate sedation with fentanyl and Versed for 50 minutes  Indications: 73 year old female with a history of DVT in October now presents with persistent left lower extremity swelling and ultrasound demonstrating likely persistent DVT.  She has been on Xarelto at this time.  She is now indicated for venogram possible intervention.  Findings: Flow in the left lower extremity was stagnant throughout but by venogram all veins were patent.  There was a focal occlusive area approximately 2 cm in length at the common and external iliac vein junction at the hypogastric on the left.  After stenting and ballooning we had a diameter of 12 millimeters in the stent and venogram demonstrated flow in the lower extremity veins were previously was stagnant and no further residual stenosis  in the left common and external iliac vein junction.   04/12/2018 Imaging   US Venous Doppler Right: No evidence of common femoral vein obstruction. Left: There is no evidence of deep vein thrombosis in the lower extremity. However, portions of this examination were limited- see technologist comments above. Left groin: Large hypoechoic area with mixed echoes noted measuring nearly 10 cm. Possible  hematoma versus unknown etiology. Ultrasound characteristics of enlarged lymph nodes noted in the groin.      05/15/2018 Imaging   US Venous Doppler Right: No evidence of deep vein thrombosis in the lower extremity. No indirect evidence of obstruction proximal to the inguinal ligament. Left: No reflux was noted in the common femoral vein , femoral vein in the thigh, popliteal vein, great saphenous vein at the saphenofemoral junction, great saphenous vein at the proximal thigh, great saphenous vein at the mid thigh, great saphenous vein  at the distal thigh, great saphenous vein at the knee, origin of the small saphenous vein, proximal small saphenous vein, and mid small saphenous vein. There is no evidence of deep vein thrombosis in the lower extremity. There is no evidence of superficial venous thrombosis. No cystic structure found in the popliteal fossa. Unable to evaluate extension of common femoral vein obstruction proximal to the inguinal ligament.   06/01/2018 Imaging   1. 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 Additionally, there is lucency involving the medial sidewall of the left acetabulum with potential nondisplaced pathologic fracture. Further evaluation with contrast-enhanced pelvic MRI could be performed as clinically indicated. 2. The left pelvic arterial and venous system is encased by this infiltrative left pelvic sidewall mass however while difficult to ascertain, the left external  iliac  venous stent appears patent.   06/18/2018 Pathology Results   Lymph node for lymphoma, Left Inguinal - METASTATIC ADENOCARCINOMA, SEE COMMENT. Microscopic Comment Immunohistochemistry is positive for cytokeratin 7, PAX8, ER, and PR. Cytokeratin 5/6,and p63 are negative. The immunoprofile along with the patient's history are consistent with a gynecologic primary.   06/18/2018 Surgery   Pre-op Diagnosis: INGUINAL LYMPHADENOPATHY, PELVIC MASS     Procedure(s): EXCISIONAL BIOPSY DEEP LEFT INGUINAL LYMPH NODE  Surgeon(s): Coralie Keens, MD    06/24/2018 Cancer Staging   Staging form: Corpus Uteri - Carcinoma and Carcinosarcoma, AJCC 8th Edition - Clinical: Stage IVB (cT1a, cN2, pM1) - Signed by [name], MD on 06/24/2018    Genetic Testing   Patient has genetic testing done for MMR on pathology from 06/18/2018. Results revealed patient has the following mutation(s): MMR: abnormal   06/29/2018 Procedure   Placement of a subcutaneous port device. Catheter tip at the SVC and right atrium junction.    Genetic Testing   Patient has genetic testing done for MSI on pathology from 06/18/2018. Results revealed patient has the following mutation(s): MSI: High   07/02/2018 PET scan   Previous hysterectomy, with asymmetric focus of hypermetabolic activity in the left vaginal cuff, suspicious for residual or recurrent carcinoma.  Large hypermetabolic soft tissue mass involving the left pelvic sidewall and acetabulum, consistent with metastatic disease.  No evidence metastatic disease within the abdomen, chest, or neck.   07/09/2018 Tumor Marker   Patient's tumor was tested for the following markers: CA-125 Results of the tumor marker test revealed 9   07/10/2018 - 08/24/2018 Chemotherapy   The patient had carboplatin and taxol x 3 cycles   07/17/2018 Genetic Testing   Negative genetic testing on the common hereditary cancer panel.  The Common Hereditary Gene Panel offered by  [facility] includes sequencing and/or deletion duplication testing of the following 48 genes: APC, ATM, AXIN2, BARD1, BMPR1A, BRCA1, BRCA2, BRIP1, CDH1, CDK4, CDKN2A (p14ARF), CDKN2A (p16INK4a), CHEK2, CTNNA1, DICER1, EPCAM (Deletion/duplication testing only), GREM1 (promoter region deletion/duplication testing only), KIT, MEN1, MLH1, MSH2, MSH3, MSH6, MUTYH, NBN, NF1, NHTL1, PALB2, PDGFRA, PMS2, POLD1, POLE, PTEN, RAD50, RAD51C, RAD51D, RNF43, SDHB, SDHC, SDHD, SMAD4, SMARCA4. STK11, TP53, TSC1, TSC2, and VHL.  The following genes were evaluated for sequence changes only: SDHA and HOXB13 c.251G>A variant only. The report date is Jul 17, 2018.    10/03/2018 Imaging   CT abdomen and pelvis 1.  No acute intra-abdominal process. 2. Grossly unchanged left pelvic sidewall mass with osseous involvement of the medial acetabulum. Progressive mild displacement of the associated comminuted pathologic fracture involving the right acetabulum and puboacetabular junction.  3. New venous stents extending from the left common iliac vein origin to the proximal left common femoral vein. The stents are patent.   11/06/2018 -  Chemotherapy   The patient had pembrolizumab for chemotherapy treatment.     Metastasis to lymph nodes ([place])  06/23/2018 Initial Diagnosis   Metastasis to lymph nodes ([place])   07/10/2018 - 09/14/2018 Chemotherapy   The patient had palonosetron (ALOXI) injection 0.25 mg, 0.25 mg, Intravenous,  Once, 3 of 6 cycles Administration: 0.25 mg (07/10/2018), 0.25 mg (07/31/2018), 0.25 mg (08/24/2018) CARBOplatin (PARAPLATIN) 480 mg in sodium chloride 0.9 % 250 mL chemo infusion, 480 mg (100 % of original dose 482.5 mg), Intravenous,  Once, 3 of 6 cycles Dose modification: 482.5 mg (original dose 482.5 mg, Cycle 1) Administration: 480 mg (07/10/2018), 480 mg (07/31/2018), 480 mg (08/24/2018) PACLitaxel (TAXOL) 276 mg in sodium chloride  0.9 % 250 mL chemo infusion (> 34m/m2), 140 mg/m2 = 276 mg (80 % of original dose  175 mg/m2), Intravenous,  Once, 3 of 6 cycles Dose modification: 140 mg/m2 (80 % of original dose 175 mg/m2, Cycle 1, Reason: Dose Not Tolerated) Administration: 276 mg (07/10/2018), 276 mg (07/31/2018), 276 mg (08/24/2018) fosaprepitant (EMEND) 150 mg, dexamethasone (DECADRON) 12 mg in sodium chloride 0.9 % 145 mL IVPB, , Intravenous,  Once, 3 of 6 cycles Administration:  (07/10/2018),  (07/31/2018),  (08/24/2018)  for chemotherapy treatment.    11/06/2018 -  Chemotherapy   The patient had pembrolizumab for chemotherapy treatment.     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 [place]  06/24/2018 Initial Diagnosis   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 ([place]   07/10/2018 - 09/14/2018 Chemotherapy   The patient had palonosetron (ALOXI) injection 0.25 mg, 0.25 mg, Intravenous,  Once, 3 of 6 cycles Administration: 0.25 mg (07/10/2018), 0.25 mg (07/31/2018), 0.25 mg (08/24/2018) CARBOplatin (PARAPLATIN) 480 mg in sodium chloride 0.9 % 250 mL chemo infusion, 480 mg (100 % of original dose 482.5 mg), Intravenous,  Once, 3 of 6 cycles Dose modification: 482.5 mg (original dose 482.5 mg, Cycle 1) Administration: 480 mg (07/10/2018), 480 mg (07/31/2018), 480 mg (08/24/2018) PACLitaxel (TAXOL) 276 mg in sodium chloride 0.9 % 250 mL chemo infusion (> 840mm2), 140 mg/m2 = 276 mg (80 % of original dose 175 mg/m2), Intravenous,  Once, 3 of 6 cycles Dose modification: 140 mg/m2 (80 % of original dose 175 mg/m2, Cycle 1, Reason: Dose Not Tolerated) Administration: 276 mg (07/10/2018), 276 mg (07/31/2018), 276 mg (08/24/2018) fosaprepitant (EMEND) 150 mg, dexamethasone (DECADRON) 12 mg in sodium chloride 0.9 % 145 mL IVPB, , Intravenous,  Once, 3 of 6 cycles Administration:  (07/10/2018),  (07/31/2018),  (08/24/2018)  for chemotherapy treatment.    11/06/2018 -  Chemotherapy   The patient had pembrolizumab for chemotherapy treatment.     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       REVIEW OF SYSTEMS:   Constitutional: Denies fevers, chills or abnormal weight loss Eyes: Denies blurriness of vision Ears, nose, mouth, throat, and face: Denies mucositis or sore throat Respiratory: Denies cough, dyspnea or wheezes Cardiovascular: Denies palpitation, chest discomfort Gastrointestinal:  Denies nausea, heartburn or change in bowel habits Skin: Denies abnormal skin rashes Lymphatics: Denies new lymphadenopathy or easy bruising Neurological:Denies numbness, tingling or new weaknesses Behavioral/Psych: Mood is stable, no new changes  All other systems were reviewed with the patient and are negative.  I have reviewed the past medical history, past surgical history, social history and family history with the patient and they are unchanged from previous note.  ALLERGIES:  has No Known Allergies.  MEDICATIONS:  Current Outpatient Medications  Medication Sig Dispense Refill  . clopidogrel (PLAVIX) 75 MG tablet Take 1 tablet (75 mg total) by mouth daily with breakfast. 30 tablet 1  . Eliquis DVT/PE Starter Pack (ELIQUIS STARTER PACK) 5 MG TABS Take as directed on package: start with two-7m7mablets twice daily for 7 days. On day 8, switch to one-7mg68mblet twice daily. 1 each 0  . morphine (MSIR) 15 MG tablet Take 1 tablet (15 mg total) by mouth every 6 (six) hours as needed for severe pain. 60 tablet 0  . Olopatadine HCl 0.2 % SOLN Place 1 drop into both eyes  daily.    . ondansetron (ZOFRAN ODT) 4 MG disintegrating tablet Take 1 tablet (4 mg total) by mouth every 8 (eight) hours as needed. 10 tablet 0   No current facility-administered medications for this visit.     PHYSICAL EXAMINATION: ECOG PERFORMANCE STATUS: 2 - Symptomatic, <50% confined to bed  Vitals:   11/27/18 1128  BP: 134/62  Pulse: 73  Resp:  18  Temp: 98.5 F (36.9 C)  SpO2: 100%   Filed Weights   11/27/18 1128  Weight: 187 lb 9.6 oz (85.1 kg)    GENERAL:alert, no distress and comfortable.  Limited exam due to body habitus SKIN: skin color, texture, turgor are normal, no rashes or significant lesions EYES: normal, Conjunctiva are pink and non-injected, sclera clear OROPHARYNX:no exudate, no erythema and lips, buccal mucosa, and tongue normal  NECK: supple, thyroid normal size, non-tender, without nodularity LYMPH:  no palpable lymphadenopathy in the cervical, axillary or inguinal LUNGS: clear to auscultation and percussion with normal breathing effort HEART: regular rate & rhythm and no murmurs with bilateral lower extremity edema ABDOMEN:abdomen soft, non-tender and normal bowel sounds Musculoskeletal:no cyanosis of digits and no clubbing  NEURO: alert & oriented x 3 with fluent speech, no focal motor/sensory deficits  LABORATORY DATA:  I have reviewed the data as listed    Component Value Date/Time   NA 139 11/06/2018 1054   K 3.6 11/06/2018 1054   CL 106 11/06/2018 1054   CO2 25 11/06/2018 1054   GLUCOSE 97 11/06/2018 1054   BUN 7 (L) 11/06/2018 1054   CREATININE 0.71 11/06/2018 1054   CALCIUM 9.1 11/06/2018 1054   PROT 7.0 11/06/2018 1054   ALBUMIN 3.4 (L) 11/06/2018 1054   AST 11 (L) 11/06/2018 1054   ALT <6 11/06/2018 1054   ALKPHOS 77 11/06/2018 1054   BILITOT 0.5 11/06/2018 1054   GFRNONAA >60 11/06/2018 1054   GFRAA >60 11/06/2018 1054    No results found for: SPEP, UPEP  Lab Results  Component Value Date   WBC 3.1 (L) 11/27/2018   NEUTROABS 2.1 11/27/2018   HGB 9.3 (L) 11/27/2018   HCT 29.7 (L) 11/27/2018   MCV 93.4 11/27/2018   PLT 254 11/27/2018      Chemistry      Component Value Date/Time   NA 139 11/06/2018 1054   K 3.6 11/06/2018 1054   CL 106 11/06/2018 1054   CO2 25 11/06/2018 1054   BUN 7 (L) 11/06/2018 1054   CREATININE 0.71 11/06/2018 1054      Component Value  Date/Time   CALCIUM 9.1 11/06/2018 1054   ALKPHOS 77 11/06/2018 1054   AST 11 (L) 11/06/2018 1054   ALT <6 11/06/2018 1054   BILITOT 0.5 11/06/2018 1054       All questions were answered. The patient knows to call the clinic with any problems, questions or concerns. No barriers to learning was detected.  I spent 15 minutes counseling the patient face to face. The total time spent in the appointment was 20 minutes and more than 50% was on counseling and review of test results  [name], MD 11/27/2018 11:49 AM

## 2018-11-27 NOTE — Assessment & Plan Note (Signed)
So far, she tolerated pembrolizumab well She denies side effects from treatment She have lost significant of weight compared to her weight in August but her leg swelling has improved We will continue treatment without dose adjustment I recommend minimum 3 cycles of treatment before repeat imaging study next month

## 2018-11-27 NOTE — Assessment & Plan Note (Signed)
She denies worsening leg swelling after radiation treatment She will continue chronic anticoagulation therapy indefinitely

## 2018-11-27 NOTE — Assessment & Plan Note (Signed)
Leg pain is well controlled with morphine sulfate I refilled her prescription today We discussed narcotic refill policy

## 2018-11-27 NOTE — Progress Notes (Signed)
Met w/ pt to introduce myself as her Arboriculturist.  Unfortunately there aren't any foundations offering copay assistance for her Dx and the type of ins she has.  I offered the [employer], went over what it covers, gave her the income requirement and an expense sheet.  She would like to apply so she will bring her proof of income to her next visit.  She has my card for any questions or concerns she may have in the future.

## 2018-11-27 NOTE — Assessment & Plan Note (Signed)
She has mild pancytopenia due to treatment She is not symptomatic Observe only for now

## 2018-11-28 LAB — T4: T4, Total: 7 ug/dL (ref 4.5–12.0)

## 2018-12-18 ENCOUNTER — Inpatient Hospital Stay (HOSPITAL_BASED_OUTPATIENT_CLINIC_OR_DEPARTMENT_OTHER): Payer: Medicare Other | Admitting: Hematology and Oncology

## 2018-12-18 ENCOUNTER — Other Ambulatory Visit: Payer: Self-pay

## 2018-12-18 ENCOUNTER — Encounter: Payer: Self-pay | Admitting: Hematology and Oncology

## 2018-12-18 ENCOUNTER — Inpatient Hospital Stay: Payer: Medicare Other

## 2018-12-18 DIAGNOSIS — C55 Malignant neoplasm of uterus, part unspecified: Secondary | ICD-10-CM

## 2018-12-18 DIAGNOSIS — D61818 Other pancytopenia: Secondary | ICD-10-CM

## 2018-12-18 DIAGNOSIS — C7951 Secondary malignant neoplasm of bone: Secondary | ICD-10-CM

## 2018-12-18 DIAGNOSIS — I825Z2 Chronic embolism and thrombosis of unspecified deep veins of left distal lower extremity: Secondary | ICD-10-CM

## 2018-12-18 DIAGNOSIS — Z7189 Other specified counseling: Secondary | ICD-10-CM

## 2018-12-18 DIAGNOSIS — C801 Malignant (primary) neoplasm, unspecified: Secondary | ICD-10-CM

## 2018-12-18 DIAGNOSIS — C774 Secondary and unspecified malignant neoplasm of inguinal and lower limb lymph nodes: Secondary | ICD-10-CM

## 2018-12-18 DIAGNOSIS — G893 Neoplasm related pain (acute) (chronic): Secondary | ICD-10-CM

## 2018-12-18 DIAGNOSIS — C541 Malignant neoplasm of endometrium: Secondary | ICD-10-CM | POA: Diagnosis not present

## 2018-12-18 LAB — CMP (CANCER CENTER ONLY)
ALT: <6 U/L (ref 0–44)
AST: 9 U/L — ABNORMAL LOW (ref 15–41)
Albumin: 3.5 g/dL (ref 3.5–5.0)
Alkaline Phosphatase: 83 U/L (ref 38–126)
Anion gap: 10 (ref 5–15)
BUN: 9 mg/dL (ref 8–23)
CO2: 24 mmol/L (ref 22–32)
Calcium: 9.4 mg/dL (ref 8.9–10.3)
Chloride: 105 mmol/L (ref 98–111)
Creatinine: 0.62 mg/dL (ref 0.44–1.00)
GFR, Est AFR Am: >60 mL/min (ref >60)
GFR, Estimated: >60 mL/min (ref >60)
Glucose, Bld: 91 mg/dL (ref 70–99)
Potassium: 3.5 mmol/L (ref 3.5–5.1)
Sodium: 139 mmol/L (ref 135–145)
Total Bilirubin: 0.3 mg/dL (ref 0.3–1.2)
Total Protein: 7.4 g/dL (ref 6.5–8.1)

## 2018-12-18 LAB — CBC WITH DIFFERENTIAL (CANCER CENTER ONLY)
Abs Immature Granulocytes: 0 10*3/uL (ref 0.00–0.07)
Basophils Absolute: 0 10*3/uL (ref 0.0–0.1)
Basophils Relative: 0 %
Eosinophils Absolute: 0 10*3/uL (ref 0.0–0.5)
Eosinophils Relative: 1 %
HCT: 29.8 % — ABNORMAL LOW (ref 36.0–46.0)
Hemoglobin: 9.5 g/dL — ABNORMAL LOW (ref 12.0–15.0)
Immature Granulocytes: 0 %
Lymphocytes Relative: 23 %
Lymphs Abs: 0.7 10*3/uL (ref 0.7–4.0)
MCH: 28.7 pg (ref 26.0–34.0)
MCHC: 31.9 g/dL (ref 30.0–36.0)
MCV: 90 fL (ref 80.0–100.0)
Monocytes Absolute: 0.4 10*3/uL (ref 0.1–1.0)
Monocytes Relative: 11 %
Neutro Abs: 2 10*3/uL (ref 1.7–7.7)
Neutrophils Relative %: 65 %
Platelet Count: 271 10*3/uL (ref 150–400)
RBC: 3.31 MIL/uL — ABNORMAL LOW (ref 3.87–5.11)
RDW: 14.6 % (ref 11.5–15.5)
WBC Count: 3.1 10*3/uL — ABNORMAL LOW (ref 4.0–10.5)
nRBC: 0 % (ref 0.0–0.2)

## 2018-12-18 LAB — TSH: TSH: 0.926 u[IU]/mL (ref 0.308–3.960)

## 2018-12-18 MED ORDER — SODIUM CHLORIDE 0.9 % IV SOLN
200.0000 mg | Freq: Once | INTRAVENOUS | Status: AC
  Administered 2018-12-18: 200 mg via INTRAVENOUS
  Filled 2018-12-18: qty 8

## 2018-12-18 MED ORDER — SODIUM CHLORIDE 0.9 % IV SOLN
Freq: Once | INTRAVENOUS | Status: AC
  Administered 2018-12-18: 13:00:00 via INTRAVENOUS
  Filled 2018-12-18: qty 250

## 2018-12-18 MED ORDER — HEPARIN SOD (PORK) LOCK FLUSH 100 UNIT/ML IV SOLN
500.0000 [IU] | Freq: Once | INTRAVENOUS | Status: AC | PRN
  Administered 2018-12-18: 500 [IU]
  Filled 2018-12-18: qty 5

## 2018-12-18 MED ORDER — SODIUM CHLORIDE 0.9% FLUSH
10.0000 mL | Freq: Once | INTRAVENOUS | Status: AC
  Administered 2018-12-18: 10 mL
  Filled 2018-12-18: qty 10

## 2018-12-18 MED ORDER — SODIUM CHLORIDE 0.9% FLUSH
10.0000 mL | INTRAVENOUS | Status: DC | PRN
  Administered 2018-12-18: 10 mL
  Filled 2018-12-18: qty 10

## 2018-12-18 NOTE — Assessment & Plan Note (Signed)
Leg pain is well controlled with morphine sulfate We discussed narcotic refill policy

## 2018-12-18 NOTE — Assessment & Plan Note (Signed)
She has mild pancytopenia due to treatment She is not symptomatic Observe only for now

## 2018-12-18 NOTE — Progress Notes (Signed)
[place] OFFICE PROGRESS NOTE  Patient Care Team: Nolene Ebbs, MD as PCP - General (Internal Medicine)  ASSESSMENT & PLAN:  Uterine cancer [facility]) So far, she tolerated pembrolizumab well She denies side effects from treatment She have lost significant of weight compared to her weight in August but her leg swelling has improved We will continue treatment without dose adjustment I recommend minimum 3 cycles of treatment before repeat imaging study next month  Pancytopenia, acquired ([place]) She has mild pancytopenia due to treatment She is not symptomatic Observe only for now  Cancer associated pain Leg pain is well controlled with morphine sulfate We discussed narcotic refill policy  Lower leg DVT (deep venous thromboembolism), chronic, left ([place]) She denies worsening leg swelling after radiation treatment She has no recent bleeding complications She will continue chronic anticoagulation therapy indefinitely   No orders of the defined types were placed in this encounter.   INTERVAL HISTORY: Please see below for problem oriented charting. 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 Uterus +/- tubes/ovaries, neoplastic, with left fallopian tube and ovary - INVASIVE ENDOMETRIOID CARCINOMA (1.5 CM), FIGO GRADE II, ARISING IN A BACKGROUND OF ATYPICAL COMPLEX HYPERPLASIA, CONFINED WITHIN INNER HALF OF THE MYOMETRIUM. - ENDOMETRIAL POLYP WITH ASSOCIATED ATYPICAL COMPLEX HYPERPLASIA. - MYOMETRIUM: LEIOMYOMATA. - CERVIX: BENIGN SQUAMOUS MUCOSA AND ENDOCERVICAL MUCOSA, NO DYSPLASIA OR  MALIGNANCY. - LEFT OVARY: BENIGN OVARIAN TISSUE WITH ENDOSALPINGOSIS, NO EVIDENCE OF ATYPIA OR MALIGNANCY. - LEFT FALLOPIAN TUBE: NO HISTOLOGIC ABNORMALITIES. - PLEASE SEE ONCOLOGY TEMPLATE FOR DETAIL. 2. Ovary and fallopian tube, right - BENIGN OVARIAN TISSUE WITH ENDOSALPINGOSIS, NO ATYPIA OR MALIGNANCY. - BENIGN FALLOPIAN TUBAL TISSUE, NO PATHOLOGIC ABNORMALITIES. Microscopic Comment 1. UTERUS Specimen: Uterus, cervix, bilateral ovaries and fallopian tubes Procedure: Total hysterectomy and bilateral salpingo-oophorectomy Lymph node sampling performed: No Specimen integrity: Intact Maximum tumor size (cm): 1.5 cm, glass slide measurement Histologic type: Invasive endometrioid carcinoma Grade: FIGO grade II Myometrial invasion: 1 cm where myometrium is 2.3 cm in thickness Cervical stromal involvement: No Extent of involvement of other organs: No Lymph vascular invasion: Not identified Peritoneal washings: Negative ([id number]) Lymph nodes: number examined N/A; number positive N/A TNM code: pT1a, pNX 1 oFf 3IGO Stage (based on pathologic findings, needs clinical correlation): IA  Comments: Sections the endomyometrium away from the grossly identified endometrial polyp show an invasive FIGO grade II endometrioid carcinoma. The tumor is confined within inner half of the myometrium. No angiolymphatic invasion is identified. No cervical stromal involvement is identified. Sections of the grossly identified endometrial polyp show an endometrial polyp with associated atypical compacted hyperplasia with no definitive evidence of carcinoma.   12/07/2017 Imaging   US venous Doppler Right: No evidence of common femoral vein obstruction. Left: Findings consistent with acute deep vein thrombosis involving the left femoral vein, left proximal profunda vein, and left popliteal vein. Unable to adequately interrogate the common femoral and higher, or the calf secondary to significant edema and body  habitus   12/07/2017 [facility] Admission   She presented to the ER and was diagnosed with acute DVT   01/18/2018 - 01/21/2018 Hospital Admission   She was  admitted to the hospital for management of severe persistent DVT   01/18/2018 Imaging   US venous Doppler Right: No evidence of common femoral vein obstruction. Left: Findings consistent with acute deep vein thrombosis involving the left common femoral vein, and left popliteal vein.   01/19/2018 Surgery   Pre-operative Diagnosis: Subacute DVT with severe post thrombotic syndrome Post-operative diagnosis:  Same Surgeon:  Erlene Quan C. Donzetta Matters, MD Procedure Performed: 1.  Ultrasound-guided cannulation left small saphenous vein 2.  Left lower extremity and central venography 3.  Intravascular ultrasound of left popliteal, femoral, common femoral, external and common iliac veins and IVC 4.  Stent of left common and external iliac veins with 14 x 60 mm Vici 5.  Moderate sedation with fentanyl and Versed for 50 minutes  Indications: 73 year old female with a history of DVT in October now presents with persistent left lower extremity swelling and ultrasound demonstrating likely persistent DVT.  She has been on Xarelto at this time.  She is now indicated for venogram possible intervention.  Findings: Flow in the left lower extremity was stagnant throughout but by venogram all veins were patent.  There was a focal occlusive area approximately 2 cm in length at the common and external iliac vein junction at the hypogastric on the left.  After stenting and ballooning we had a diameter of 12 millimeters in the stent and venogram demonstrated flow in the lower extremity veins were previously was stagnant and no further residual stenosis in the left common and external iliac vein junction.   04/12/2018 Imaging   US Venous Doppler Right: No evidence of common femoral vein obstruction. Left: There is no evidence of deep vein thrombosis in the lower  extremity. However, portions of this examination were limited- see technologist comments above. Left groin: Large hypoechoic area with mixed echoes noted measuring nearly 10 cm. Possible  hematoma versus unknown etiology. Ultrasound characteristics of enlarged lymph nodes noted in the groin.      05/15/2018 Imaging   US Venous Doppler Right: No evidence of deep vein thrombosis in the lower extremity. No indirect evidence of obstruction proximal to the inguinal ligament. Left: No reflux was noted in the common femoral vein , femoral vein in the thigh, popliteal vein, great saphenous vein at the saphenofemoral junction, great saphenous vein at the proximal thigh, great saphenous vein at the mid thigh, great saphenous vein  at the distal thigh, great saphenous vein at the knee, origin of the small saphenous vein, proximal small saphenous vein, and mid small saphenous vein. There is no evidence of deep vein thrombosis in the lower extremity. There is no evidence of superficial venous thrombosis. No cystic structure found in the popliteal fossa. Unable to evaluate extension of common femoral vein obstruction proximal to the inguinal ligament.   06/01/2018 Imaging   1. 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 Additionally, there is lucency involving the medial sidewall of the left acetabulum with potential nondisplaced pathologic fracture. Further evaluation with contrast-enhanced pelvic MRI could be performed as clinically indicated. 2. The left pelvic arterial and venous system is encased by this infiltrative left pelvic sidewall mass however while difficult to ascertain, the left external iliac venous stent appears patent.   06/18/2018 Pathology Results   Lymph node for lymphoma, Left Inguinal - METASTATIC ADENOCARCINOMA, SEE COMMENT. Microscopic Comment Immunohistochemistry is positive for  cytokeratin 7, PAX8, ER, and PR. Cytokeratin 5/6,and p63 are negative. The immunoprofile along with  the patient's history are consistent with a gynecologic primary.   06/18/2018 Surgery   Pre-op Diagnosis: INGUINAL LYMPHADENOPATHY, PELVIC MASS     Procedure(s): EXCISIONAL BIOPSY DEEP LEFT INGUINAL LYMPH NODE  Surgeon(s): Coralie Keens, MD    06/24/2018 Cancer Staging   Staging form: Corpus Uteri - Carcinoma and Carcinosarcoma, AJCC 8th Edition - Clinical: Stage IVB (cT1a, cN2, pM1) - Signed by [name], MD on 06/24/2018    Genetic Testing   Patient has genetic testing done for MMR on pathology from 06/18/2018. Results revealed patient has the following mutation(s): MMR: abnormal   06/29/2018 Procedure   Placement of a subcutaneous port device. Catheter tip at the SVC and right atrium junction.    Genetic Testing   Patient has genetic testing done for MSI on pathology from 06/18/2018. Results revealed patient has the following mutation(s): MSI: High   07/02/2018 PET scan   Previous hysterectomy, with asymmetric focus of hypermetabolic activity in the left vaginal cuff, suspicious for residual or recurrent carcinoma.  Large hypermetabolic soft tissue mass involving the left pelvic sidewall and acetabulum, consistent with metastatic disease.  No evidence metastatic disease within the abdomen, chest, or neck.   07/09/2018 Tumor Marker   Patient's tumor was tested for the following markers: CA-125 Results of the tumor marker test revealed 9   07/10/2018 - 08/24/2018 Chemotherapy   The patient had carboplatin and taxol x 3 cycles   07/17/2018 Genetic Testing   Negative genetic testing on the common hereditary cancer panel.  The Common Hereditary Gene Panel offered by [facility] includes sequencing and/or deletion duplication testing of the following 48 genes: APC, ATM, AXIN2, BARD1, BMPR1A, BRCA1, BRCA2, BRIP1, CDH1, CDK4, CDKN2A (p14ARF), CDKN2A (p16INK4a), CHEK2, CTNNA1, DICER1,  EPCAM (Deletion/duplication testing only), GREM1 (promoter region deletion/duplication testing only), KIT, MEN1, MLH1, MSH2, MSH3, MSH6, MUTYH, NBN, NF1, NHTL1, PALB2, PDGFRA, PMS2, POLD1, POLE, PTEN, RAD50, RAD51C, RAD51D, RNF43, SDHB, SDHC, SDHD, SMAD4, SMARCA4. STK11, TP53, TSC1, TSC2, and VHL.  The following genes were evaluated for sequence changes only: SDHA and HOXB13 c.251G>A variant only. The report date is Jul 17, 2018.    10/03/2018 Imaging   CT abdomen and pelvis 1.  No acute intra-abdominal process. 2. Grossly unchanged left pelvic sidewall mass with osseous involvement of the medial acetabulum. Progressive mild displacement of the associated comminuted pathologic fracture involving the right acetabulum and puboacetabular junction.  3. New venous stents extending from the left common iliac vein origin to the proximal left common femoral vein. The stents are patent.   11/06/2018 -  Chemotherapy   The patient had pembrolizumab for chemotherapy treatment.     Metastasis to lymph nodes ([place])  06/23/2018 Initial Diagnosis   Metastasis to lymph nodes ([place])   07/10/2018 - 09/14/2018 Chemotherapy   The patient had palonosetron (ALOXI) injection 0.25 mg, 0.25 mg, Intravenous,  Once, 3 of 6 cycles Administration: 0.25 mg (07/10/2018), 0.25 mg (07/31/2018), 0.25 mg (08/24/2018) CARBOplatin (PARAPLATIN) 480 mg in sodium chloride 0.9 % 250 mL chemo infusion, 480 mg (100 % of original dose 482.5 mg), Intravenous,  Once, 3 of 6 cycles Dose modification: 482.5 mg (original dose 482.5 mg, Cycle 1) Administration: 480 mg (07/10/2018), 480 mg (07/31/2018), 480 mg (08/24/2018) PACLitaxel (TAXOL) 276 mg in sodium chloride 0.9 % 250 mL chemo infusion (> 49m/m2), 140 mg/m2 = 276 mg (80 % of original dose 175 mg/m2), Intravenous,  Once, 3 of 6 cycles Dose modification: 140 mg/m2 (80 % of original dose 175 mg/m2, Cycle 1, Reason: Dose Not  Tolerated) Administration: 276 mg (07/10/2018), 276 mg (07/31/2018), 276 mg  (08/24/2018) fosaprepitant (EMEND) 150 mg, dexamethasone (DECADRON) 12 mg in sodium chloride 0.9 % 145 mL IVPB, , Intravenous,  Once, 3 of 6 cycles Administration:  (07/10/2018),  (07/31/2018),  (08/24/2018)  for chemotherapy treatment.    11/06/2018 -  Chemotherapy   The patient had pembrolizumab for chemotherapy treatment.     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 ([place])  06/24/2018 Initial Diagnosis   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 ([place])   07/10/2018 - 09/14/2018 Chemotherapy   The patient had palonosetron (ALOXI) injection 0.25 mg, 0.25 mg, Intravenous,  Once, 3 of 6 cycles Administration: 0.25 mg (07/10/2018), 0.25 mg (07/31/2018), 0.25 mg (08/24/2018) CARBOplatin (PARAPLATIN) 480 mg in sodium chloride 0.9 % 250 mL chemo infusion, 480 mg (100 % of original dose 482.5 mg), Intravenous,  Once, 3 of 6 cycles Dose modification: 482.5 mg (original dose 482.5 mg, Cycle 1) Administration: 480 mg (07/10/2018), 480 mg (07/31/2018), 480 mg (08/24/2018) PACLitaxel (TAXOL) 276 mg in sodium chloride 0.9 % 250 mL chemo infusion (> 57m/m2), 140 mg/m2 = 276 mg (80 % of original dose 175 mg/m2), Intravenous,  Once, 3 of 6 cycles Dose modification: 140 mg/m2 (80 % of original dose 175 mg/m2, Cycle 1, Reason: Dose Not Tolerated) Administration: 276 mg (07/10/2018), 276 mg (07/31/2018), 276 mg (08/24/2018) fosaprepitant (EMEND) 150 mg, dexamethasone (DECADRON) 12 mg in sodium chloride 0.9 % 145 mL IVPB, , Intravenous,  Once, 3 of 6 cycles Administration:  (07/10/2018),  (07/31/2018),  (08/24/2018)  for chemotherapy treatment.    11/06/2018 -  Chemotherapy   The patient had pembrolizumab for chemotherapy treatment.     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       REVIEW OF SYSTEMS:   Constitutional:  Denies fevers, chills or abnormal weight loss Eyes: Denies blurriness of vision Ears, nose, mouth, throat, and face: Denies mucositis or sore throat Respiratory: Denies cough, dyspnea or wheezes Cardiovascular: Denies palpitation, chest discomfort or lower extremity swelling Gastrointestinal:  Denies nausea, heartburn or change in bowel habits Skin: Denies abnormal skin rashes Lymphatics: Denies new lymphadenopathy or easy bruising Neurological:Denies numbness, tingling or new weaknesses Behavioral/Psych: Mood is stable, no new changes  All other systems were reviewed with the patient and are negative.  I have reviewed the past medical history, past surgical history, social history and family history with the patient and they are unchanged from previous note.  ALLERGIES:  has No Known Allergies.  MEDICATIONS:  Current Outpatient Medications  Medication Sig Dispense Refill  . clopidogrel (PLAVIX) 75 MG tablet Take 1 tablet (75 mg total) by mouth daily with breakfast. 30 tablet 1  . Eliquis DVT/PE Starter Pack (ELIQUIS STARTER PACK) 5 MG TABS Take as directed on package: start with two-561mtablets twice daily for 7 days. On day 8, switch to one-32m32mablet twice daily. 1 each 0  . morphine (MSIR) 15 MG tablet Take 1 tablet (15 mg total) by mouth every 6 (six) hours as needed for severe pain. 60 tablet 0  . Olopatadine HCl 0.2 % SOLN Place 1 drop into both eyes daily.    . ondansetron (ZOFRAN ODT) 4 MG disintegrating tablet Take 1 tablet (4 mg total) by mouth every 8 (eight) hours as needed. 10 tablet 0   No current facility-administered medications for this visit.  PHYSICAL EXAMINATION: ECOG PERFORMANCE STATUS: 1 - Symptomatic but completely ambulatory  Vitals:   12/18/18 1200  BP: (!) 113/50  Pulse: 65  Resp: 18  Temp: 98.7 F (37.1 C)  SpO2: 100%   Filed Weights   12/18/18 1200  Weight: 183 lb (83 kg)    GENERAL:alert, no distress and comfortable SKIN: skin color,  texture, turgor are normal, no rashes or significant lesions EYES: normal, Conjunctiva are pink and non-injected, sclera clear OROPHARYNX:no exudate, no erythema and lips, buccal mucosa, and tongue normal  NECK: supple, thyroid normal size, non-tender, without nodularity LYMPH:  no palpable lymphadenopathy in the cervical, axillary or inguinal LUNGS: clear to auscultation and percussion with normal breathing effort HEART: regular rate & rhythm and no murmurs with minimum bilateral lower extremity edema ABDOMEN:abdomen soft, non-tender and normal bowel sounds Musculoskeletal:no cyanosis of digits and no clubbing  NEURO: alert & oriented x 3 with fluent speech, no focal motor/sensory deficits  LABORATORY DATA:  I have reviewed the data as listed    Component Value Date/Time   NA 139 12/18/2018 1110   K 3.5 12/18/2018 1110   CL 105 12/18/2018 1110   CO2 24 12/18/2018 1110   GLUCOSE 91 12/18/2018 1110   BUN 9 12/18/2018 1110   CREATININE 0.62 12/18/2018 1110   CALCIUM 9.4 12/18/2018 1110   PROT 7.4 12/18/2018 1110   ALBUMIN 3.5 12/18/2018 1110   AST 9 (L) 12/18/2018 1110   ALT <6 12/18/2018 1110   ALKPHOS 83 12/18/2018 1110   BILITOT 0.3 12/18/2018 1110   GFRNONAA >60 12/18/2018 1110   GFRAA >60 12/18/2018 1110    No results found for: SPEP, UPEP  Lab Results  Component Value Date   WBC 3.1 (L) 12/18/2018   NEUTROABS 2.0 12/18/2018   HGB 9.5 (L) 12/18/2018   HCT 29.8 (L) 12/18/2018   MCV 90.0 12/18/2018   PLT 271 12/18/2018      Chemistry      Component Value Date/Time   NA 139 12/18/2018 1110   K 3.5 12/18/2018 1110   CL 105 12/18/2018 1110   CO2 24 12/18/2018 1110   BUN 9 12/18/2018 1110   CREATININE 0.62 12/18/2018 1110      Component Value Date/Time   CALCIUM 9.4 12/18/2018 1110   ALKPHOS 83 12/18/2018 1110   AST 9 (L) 12/18/2018 1110   ALT <6 12/18/2018 1110   BILITOT 0.3 12/18/2018 1110       All questions were answered. The patient knows to call the  clinic with any problems, questions or concerns. No barriers to learning was detected.  I spent 15 minutes counseling the patient face to face. The total time spent in the appointment was 20 minutes and more than 50% was on counseling and review of test results  [name], MD 12/18/2018 12:09 PM

## 2018-12-18 NOTE — Assessment & Plan Note (Addendum)
She denies worsening leg swelling after radiation treatment She has no recent bleeding complications She will continue chronic anticoagulation therapy indefinitely

## 2018-12-18 NOTE — Progress Notes (Signed)
Pt is approved for the $1000 [employer] grant.  

## 2018-12-18 NOTE — Patient Instructions (Signed)
[facility] Discharge Instructions for Patients Receiving Chemotherapy  Today you received the following chemotherapy agents:  Keytruda.  To help prevent nausea and vomiting after your treatment, we encourage you to take your nausea medication as directed.   If you develop nausea and vomiting that is not controlled by your nausea medication, call the clinic.   BELOW ARE SYMPTOMS THAT SHOULD BE REPORTED IMMEDIATELY:  *FEVER GREATER THAN 100.5 F  *CHILLS WITH OR WITHOUT FEVER  NAUSEA AND VOMITING THAT IS NOT CONTROLLED WITH YOUR NAUSEA MEDICATION  *UNUSUAL SHORTNESS OF BREATH  *UNUSUAL BRUISING OR BLEEDING  TENDERNESS IN MOUTH AND THROAT WITH OR WITHOUT PRESENCE OF ULCERS  *URINARY PROBLEMS  *BOWEL PROBLEMS  UNUSUAL RASH Items with * indicate a potential emergency and should be followed up as soon as possible.  Feel free to call the clinic should you have any questions or concerns. The clinic phone number is [phone].  Please show the CHEMO ALERT CARD at check-in to the Emergency Department and triage nurse.    

## 2018-12-18 NOTE — Patient Instructions (Signed)

## 2018-12-18 NOTE — Assessment & Plan Note (Signed)
So far, she tolerated pembrolizumab well She denies side effects from treatment She have lost significant of weight compared to her weight in August but her leg swelling has improved We will continue treatment without dose adjustment I recommend minimum 3 cycles of treatment before repeat imaging study next month

## 2018-12-19 LAB — T4: T4, Total: 7.2 ug/dL (ref 4.5–12.0)

## 2019-01-08 ENCOUNTER — Inpatient Hospital Stay (HOSPITAL_BASED_OUTPATIENT_CLINIC_OR_DEPARTMENT_OTHER): Payer: Medicare Other | Admitting: Hematology and Oncology

## 2019-01-08 ENCOUNTER — Inpatient Hospital Stay: Payer: Medicare Other | Attending: Hematology and Oncology

## 2019-01-08 ENCOUNTER — Encounter: Payer: Self-pay | Admitting: Hematology and Oncology

## 2019-01-08 ENCOUNTER — Other Ambulatory Visit: Payer: Self-pay

## 2019-01-08 ENCOUNTER — Inpatient Hospital Stay: Payer: Medicare Other

## 2019-01-08 VITALS — BP 120/86 | HR 87 | Temp 98.2°F | Resp 18 | Ht 61.0 in | Wt 181.8 lb

## 2019-01-08 DIAGNOSIS — C55 Malignant neoplasm of uterus, part unspecified: Secondary | ICD-10-CM

## 2019-01-08 DIAGNOSIS — Z79899 Other long term (current) drug therapy: Secondary | ICD-10-CM | POA: Insufficient documentation

## 2019-01-08 DIAGNOSIS — C778 Secondary and unspecified malignant neoplasm of lymph nodes of multiple regions: Secondary | ICD-10-CM | POA: Diagnosis not present

## 2019-01-08 DIAGNOSIS — D509 Iron deficiency anemia, unspecified: Secondary | ICD-10-CM | POA: Diagnosis not present

## 2019-01-08 DIAGNOSIS — G893 Neoplasm related pain (acute) (chronic): Secondary | ICD-10-CM

## 2019-01-08 DIAGNOSIS — D61818 Other pancytopenia: Secondary | ICD-10-CM

## 2019-01-08 DIAGNOSIS — C801 Malignant (primary) neoplasm, unspecified: Secondary | ICD-10-CM

## 2019-01-08 DIAGNOSIS — Z5112 Encounter for antineoplastic immunotherapy: Secondary | ICD-10-CM | POA: Diagnosis not present

## 2019-01-08 DIAGNOSIS — C7951 Secondary malignant neoplasm of bone: Secondary | ICD-10-CM

## 2019-01-08 DIAGNOSIS — Z7189 Other specified counseling: Secondary | ICD-10-CM

## 2019-01-08 DIAGNOSIS — D539 Nutritional anemia, unspecified: Secondary | ICD-10-CM

## 2019-01-08 DIAGNOSIS — I825Z2 Chronic embolism and thrombosis of unspecified deep veins of left distal lower extremity: Secondary | ICD-10-CM

## 2019-01-08 DIAGNOSIS — Z86718 Personal history of other venous thrombosis and embolism: Secondary | ICD-10-CM | POA: Diagnosis not present

## 2019-01-08 DIAGNOSIS — C774 Secondary and unspecified malignant neoplasm of inguinal and lower limb lymph nodes: Secondary | ICD-10-CM

## 2019-01-08 DIAGNOSIS — C541 Malignant neoplasm of endometrium: Secondary | ICD-10-CM | POA: Diagnosis present

## 2019-01-08 LAB — IRON AND TIBC
Iron: 28 ug/dL — ABNORMAL LOW (ref 41–142)
Saturation Ratios: 12 % — ABNORMAL LOW (ref 21–57)
TIBC: 241 ug/dL (ref 236–444)
UIBC: 213 ug/dL (ref 120–384)

## 2019-01-08 LAB — CBC WITH DIFFERENTIAL (CANCER CENTER ONLY)
Abs Immature Granulocytes: 0.01 10*3/uL (ref 0.00–0.07)
Basophils Absolute: 0 10*3/uL (ref 0.0–0.1)
Basophils Relative: 1 %
Eosinophils Absolute: 0 10*3/uL (ref 0.0–0.5)
Eosinophils Relative: 1 %
HCT: 31 % — ABNORMAL LOW (ref 36.0–46.0)
Hemoglobin: 9.8 g/dL — ABNORMAL LOW (ref 12.0–15.0)
Immature Granulocytes: 0 %
Lymphocytes Relative: 19 %
Lymphs Abs: 0.6 10*3/uL — ABNORMAL LOW (ref 0.7–4.0)
MCH: 28.7 pg (ref 26.0–34.0)
MCHC: 31.6 g/dL (ref 30.0–36.0)
MCV: 90.6 fL (ref 80.0–100.0)
Monocytes Absolute: 0.3 10*3/uL (ref 0.1–1.0)
Monocytes Relative: 12 %
Neutro Abs: 2 10*3/uL (ref 1.7–7.7)
Neutrophils Relative %: 67 %
Platelet Count: 229 10*3/uL (ref 150–400)
RBC: 3.42 MIL/uL — ABNORMAL LOW (ref 3.87–5.11)
RDW: 14.8 % (ref 11.5–15.5)
WBC Count: 2.9 10*3/uL — ABNORMAL LOW (ref 4.0–10.5)
nRBC: 0 % (ref 0.0–0.2)

## 2019-01-08 LAB — FERRITIN: Ferritin: 130 ng/mL (ref 11–307)

## 2019-01-08 LAB — CMP (CANCER CENTER ONLY)
ALT: 8 U/L (ref 0–44)
AST: 10 U/L — ABNORMAL LOW (ref 15–41)
Albumin: 3.8 g/dL (ref 3.5–5.0)
Alkaline Phosphatase: 94 U/L (ref 38–126)
Anion gap: 7 (ref 5–15)
BUN: 16 mg/dL (ref 8–23)
CO2: 25 mmol/L (ref 22–32)
Calcium: 9.4 mg/dL (ref 8.9–10.3)
Chloride: 105 mmol/L (ref 98–111)
Creatinine: 0.67 mg/dL (ref 0.44–1.00)
GFR, Est AFR Am: >60 mL/min (ref >60)
GFR, Estimated: >60 mL/min (ref >60)
Glucose, Bld: 94 mg/dL (ref 70–99)
Potassium: 3.8 mmol/L (ref 3.5–5.1)
Sodium: 137 mmol/L (ref 135–145)
Total Bilirubin: 0.4 mg/dL (ref 0.3–1.2)
Total Protein: 7.4 g/dL (ref 6.5–8.1)

## 2019-01-08 LAB — VITAMIN B12: Vitamin B-12: 269 pg/mL (ref 180–914)

## 2019-01-08 LAB — TSH: TSH: 1.053 u[IU]/mL (ref 0.308–3.960)

## 2019-01-08 MED ORDER — SODIUM CHLORIDE 0.9 % IV SOLN
Freq: Once | INTRAVENOUS | Status: AC
  Administered 2019-01-08: 11:00:00 via INTRAVENOUS
  Filled 2019-01-08: qty 250

## 2019-01-08 MED ORDER — MORPHINE SULFATE 30 MG PO TABS: 30.0000 mg | ORAL_TABLET | Freq: Four times a day (QID) | ORAL | 0 refills | Status: DC | PRN

## 2019-01-08 MED ORDER — HEPARIN SOD (PORK) LOCK FLUSH 100 UNIT/ML IV SOLN
500.0000 [IU] | Freq: Once | INTRAVENOUS | Status: AC | PRN
  Administered 2019-01-08: 500 [IU]
  Filled 2019-01-08: qty 5

## 2019-01-08 MED ORDER — METHADONE HCL 10 MG PO TABS: 20.0000 mg | ORAL_TABLET | Freq: Two times a day (BID) | ORAL | 0 refills | Status: DC

## 2019-01-08 MED ORDER — SODIUM CHLORIDE 0.9% FLUSH
10.0000 mL | INTRAVENOUS | Status: DC | PRN
  Administered 2019-01-08: 10 mL
  Filled 2019-01-08: qty 10

## 2019-01-08 MED ORDER — SODIUM CHLORIDE 0.9 % IV SOLN
200.0000 mg | Freq: Once | INTRAVENOUS | Status: AC
  Administered 2019-01-08: 200 mg via INTRAVENOUS
  Filled 2019-01-08: qty 8

## 2019-01-08 MED ORDER — SODIUM CHLORIDE 0.9% FLUSH
10.0000 mL | Freq: Once | INTRAVENOUS | Status: AC
  Administered 2019-01-08: 10 mL
  Filled 2019-01-08: qty 10

## 2019-01-08 MED ORDER — GOSERELIN ACETATE 3.6 MG ~~LOC~~ IMPL
DRUG_IMPLANT | SUBCUTANEOUS | Status: AC
  Filled 2019-01-08: qty 3.6

## 2019-01-08 NOTE — Assessment & Plan Note (Signed)
She has persistent deficiency anemia I will order iron studies and vitamin B12 level We will give her mineral replacement therapy if needed

## 2019-01-08 NOTE — Assessment & Plan Note (Signed)
She denies worsening leg swelling after radiation treatment She has no recent bleeding complications She will continue chronic anticoagulation therapy indefinitely

## 2019-01-08 NOTE — Assessment & Plan Note (Signed)
She tolerated treatment well but she has persistent pancytopenia and severe pain I plan to proceed with treatment today and order imaging study next month She agreed with the plan of care

## 2019-01-08 NOTE — Progress Notes (Signed)
[facility] OFFICE PROGRESS NOTE  Patient Care Team: Avbuere, Edwin, MD as PCP - General (Internal Medicine)  ASSESSMENT & PLAN:  Uterine cancer (HCC) She tolerated treatment well but she has persistent pancytopenia and severe pain I plan to proceed with treatment today and order imaging study next month She agreed with the plan of care  Cancer associated pain She has poorly controlled pain I recommend increasing morphine sulfate to 30 mg as needed and to add schedule methadone twice a day I will call her next week to assess pain control I warned her about risk of constipation  Deficiency anemia She has persistent deficiency anemia I will order iron studies and vitamin B12 level We will give her mineral replacement therapy if needed  Lower leg DVT (deep venous thromboembolism), chronic, left (HCC) She denies worsening leg swelling after radiation treatment She has no recent bleeding complications She will continue chronic anticoagulation therapy indefinitely   Orders Placed This Encounter  Procedures  . CT Abdomen Pelvis W Contrast    Standing Status:   Future    Standing Expiration Date:   01/08/2020    Order Specific Question:   If indicated for the ordered procedure, I authorize the administration of contrast media per Radiology protocol    Answer:   Yes    Order Specific Question:   Preferred imaging location?    Answer:   [facility]    Order Specific Question:   Radiology Contrast Protocol - do NOT remove file path    Answer:   [email or web address]  . Ferritin    Standing Status:   Future    Number of Occurrences:   1    Standing Expiration Date:   02/12/2020  . Iron and TIBC    Standing Status:   Future    Number of Occurrences:   1    Standing Expiration Date:   02/12/2020  . Vitamin B12    Standing Status:   Future    Number of Occurrences:   1    Standing Expiration Date:   02/12/2020    INTERVAL  HISTORY: Please see below for problem oriented charting. 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 Uterus +/- tubes/ovaries, neoplastic, with left fallopian tube and ovary - INVASIVE ENDOMETRIOID CARCINOMA (1.5 CM), FIGO GRADE II, ARISING IN A BACKGROUND OF ATYPICAL COMPLEX HYPERPLASIA, CONFINED WITHIN INNER HALF OF THE MYOMETRIUM. - ENDOMETRIAL POLYP WITH ASSOCIATED ATYPICAL COMPLEX HYPERPLASIA. - MYOMETRIUM: LEIOMYOMATA. - CERVIX: BENIGN SQUAMOUS MUCOSA AND ENDOCERVICAL MUCOSA, NO DYSPLASIA OR MALIGNANCY. - LEFT OVARY: BENIGN OVARIAN TISSUE WITH ENDOSALPINGOSIS, NO EVIDENCE OF ATYPIA OR MALIGNANCY. - LEFT FALLOPIAN TUBE: NO HISTOLOGIC ABNORMALITIES. - PLEASE SEE ONCOLOGY TEMPLATE FOR DETAIL. 2. Ovary and fallopian tube, right - BENIGN OVARIAN TISSUE WITH ENDOSALPINGOSIS, NO ATYPIA OR MALIGNANCY. - BENIGN FALLOPIAN TUBAL TISSUE, NO PATHOLOGIC ABNORMALITIES. Microscopic Comment 1. UTERUS Specimen: Uterus, cervix, bilateral ovaries and fallopian tubes Procedure: Total hysterectomy and bilateral salpingo-oophorectomy Lymph node sampling performed: No Specimen integrity: Intact Maximum tumor size (cm): 1.5   cm, glass slide measurement Histologic type: Invasive endometrioid carcinoma Grade: FIGO grade II Myometrial invasion: 1 cm where myometrium is 2.3 cm in thickness Cervical stromal involvement: No Extent of involvement of other organs: No Lymph vascular invasion: Not  identified Peritoneal washings: Negative ([id number]) Lymph nodes: number examined N/A; number positive N/A TNM code: pT1a, pNX 1 oFf 3IGO Stage (based on pathologic findings, needs clinical correlation): IA  Comments: Sections the endomyometrium away from the grossly identified endometrial polyp show an invasive FIGO grade II endometrioid carcinoma. The tumor is confined within inner half of the myometrium. No angiolymphatic invasion is identified. No cervical stromal involvement is identified. Sections of the grossly identified endometrial polyp show an endometrial polyp with associated atypical compacted hyperplasia with no definitive evidence of carcinoma.   12/07/2017 Imaging   US venous Doppler Right: No evidence of common femoral vein obstruction. Left: Findings consistent with acute deep vein thrombosis involving the left femoral vein, left proximal profunda vein, and left popliteal vein. Unable to adequately interrogate the common femoral and higher, or the calf secondary to significant edema and body habitus   12/07/2017 [facility] Admission   She presented to the ER and was diagnosed with acute DVT   01/18/2018 - 01/21/2018 Hospital Admission   She was admitted to the hospital for management of severe persistent DVT   01/18/2018 Imaging   US venous Doppler Right: No evidence of common femoral vein obstruction. Left: Findings consistent with acute deep vein thrombosis involving the left common femoral vein, and left popliteal vein.   01/19/2018 Surgery   Pre-operative Diagnosis: Subacute DVT with severe post thrombotic syndrome Post-operative diagnosis:  Same Surgeon:  Erlene Quan C. Donzetta Matters, MD Procedure Performed: 1.  Ultrasound-guided cannulation left small saphenous vein 2.  Left lower extremity and central venography 3.  Intravascular ultrasound of left popliteal, femoral, common femoral, external and common iliac veins and IVC 4.  Stent of left common and external iliac veins  with 14 x 60 mm Vici 5.  Moderate sedation with fentanyl and Versed for 50 minutes  Indications: 73 year old female with a history of DVT in October now presents with persistent left lower extremity swelling and ultrasound demonstrating likely persistent DVT.  She has been on Xarelto at this time.  She is now indicated for venogram possible intervention.  Findings: Flow in the left lower extremity was stagnant throughout but by venogram all veins were patent.  There was a focal occlusive area approximately 2 cm in length at the common and external iliac vein junction at the hypogastric on the left.  After stenting and ballooning we had a diameter of 12 millimeters in the stent and venogram demonstrated flow in the lower extremity veins were previously was stagnant and no further residual stenosis in the left common and external iliac vein junction.   04/12/2018 Imaging   US Venous Doppler Right: No evidence of common femoral vein obstruction. Left: There is no evidence of deep vein thrombosis in the lower extremity. However, portions of this examination were limited- see technologist comments above. Left groin: Large hypoechoic area with mixed echoes noted measuring nearly 10 cm. Possible  hematoma versus unknown etiology. Ultrasound characteristics of enlarged lymph nodes noted in the groin.      05/15/2018 Imaging   US Venous Doppler Right: No evidence of deep vein thrombosis in the lower extremity. No indirect evidence of obstruction proximal to the inguinal ligament. Left: No reflux was noted in the common femoral vein , femoral vein in the  thigh, popliteal vein, great saphenous vein at the saphenofemoral junction, great saphenous vein at the proximal thigh, great saphenous vein at the mid thigh, great saphenous vein  at the distal thigh, great saphenous vein at the knee, origin of the small saphenous vein, proximal small saphenous vein, and mid small saphenous vein. There is no evidence of  deep vein thrombosis in the lower extremity. There is no evidence of superficial venous thrombosis. No cystic structure found in the popliteal fossa. Unable to evaluate extension of common femoral vein obstruction proximal to the inguinal ligament.   06/01/2018 Imaging   1. 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 Additionally, there is lucency involving the medial sidewall of the left acetabulum with potential nondisplaced pathologic fracture. Further evaluation with contrast-enhanced pelvic MRI could be performed as clinically indicated. 2. The left pelvic arterial and venous system is encased by this infiltrative left pelvic sidewall mass however while difficult to ascertain, the left external iliac venous stent appears patent.   06/18/2018 Pathology Results   Lymph node for lymphoma, Left Inguinal - METASTATIC ADENOCARCINOMA, SEE COMMENT. Microscopic Comment Immunohistochemistry is positive for cytokeratin 7, PAX8, ER, and PR. Cytokeratin 5/6,and p63 are negative. The immunoprofile along with the patient's history are consistent with a gynecologic primary.   06/18/2018 Surgery   Pre-op Diagnosis: INGUINAL LYMPHADENOPATHY, PELVIC MASS     Procedure(s): EXCISIONAL BIOPSY DEEP LEFT INGUINAL LYMPH NODE  Surgeon(s): Coralie Keens, MD    06/24/2018 Cancer Staging   Staging form: Corpus Uteri - Carcinoma and Carcinosarcoma, AJCC 8th Edition - Clinical: Stage IVB (cT1a, cN2, pM1) - Signed by [name], MD on 06/24/2018    Genetic Testing   Patient has genetic testing done for MMR on pathology from 06/18/2018. Results revealed patient has the following mutation(s): MMR: abnormal   06/29/2018 Procedure   Placement of a subcutaneous port device. Catheter tip at the SVC and right atrium junction.    Genetic Testing   Patient has genetic testing done for MSI on pathology from  06/18/2018. Results revealed patient has the following mutation(s): MSI: High   07/02/2018 PET scan   Previous hysterectomy, with asymmetric focus of hypermetabolic activity in the left vaginal cuff, suspicious for residual or recurrent carcinoma.  Large hypermetabolic soft tissue mass involving the left pelvic sidewall and acetabulum, consistent with metastatic disease.  No evidence metastatic disease within the abdomen, chest, or neck.   07/09/2018 Tumor Marker   Patient's tumor was tested for the following markers: CA-125 Results of the tumor marker test revealed 9   07/10/2018 - 08/24/2018 Chemotherapy   The patient had carboplatin and taxol x 3 cycles   07/17/2018 Genetic Testing   Negative genetic testing on the common hereditary cancer panel.  The Common Hereditary Gene Panel offered by [facility] includes sequencing and/or deletion duplication testing of the following 48 genes: APC, ATM, AXIN2, BARD1, BMPR1A, BRCA1, BRCA2, BRIP1, CDH1, CDK4, CDKN2A (p14ARF), CDKN2A (p16INK4a), CHEK2, CTNNA1, DICER1, EPCAM (Deletion/duplication testing only), GREM1 (promoter region deletion/duplication testing only), KIT, MEN1, MLH1, MSH2, MSH3, MSH6, MUTYH, NBN, NF1, NHTL1, PALB2, PDGFRA, PMS2, POLD1, POLE, PTEN, RAD50, RAD51C, RAD51D, RNF43, SDHB, SDHC, SDHD, SMAD4, SMARCA4. STK11, TP53, TSC1, TSC2, and VHL.  The following genes were evaluated for sequence changes only: SDHA and HOXB13 c.251G>A variant only. The report date is Jul 17, 2018.    10/03/2018 Imaging   CT abdomen and pelvis 1.  No acute intra-abdominal process. 2. Grossly unchanged  left pelvic sidewall mass with osseous involvement of the medial acetabulum. Progressive mild displacement of the associated comminuted pathologic fracture involving the right acetabulum and puboacetabular junction.  3. New venous stents extending from the left common iliac vein origin to the proximal left common femoral vein. The stents are patent.   11/06/2018 -   Chemotherapy   The patient had pembrolizumab for chemotherapy treatment.     Metastasis to lymph nodes (HCC)  06/23/2018 Initial Diagnosis   Metastasis to lymph nodes (HCC)   07/10/2018 - 09/14/2018 Chemotherapy   The patient had palonosetron (ALOXI) injection 0.25 mg, 0.25 mg, Intravenous,  Once, 3 of 6 cycles Administration: 0.25 mg (07/10/2018), 0.25 mg (07/31/2018), 0.25 mg (08/24/2018) CARBOplatin (PARAPLATIN) 480 mg in sodium chloride 0.9 % 250 mL chemo infusion, 480 mg (100 % of original dose 482.5 mg), Intravenous,  Once, 3 of 6 cycles Dose modification: 482.5 mg (original dose 482.5 mg, Cycle 1) Administration: 480 mg (07/10/2018), 480 mg (07/31/2018), 480 mg (08/24/2018) PACLitaxel (TAXOL) 276 mg in sodium chloride 0.9 % 250 mL chemo infusion (> 80mg/m2), 140 mg/m2 = 276 mg (80 % of original dose 175 mg/m2), Intravenous,  Once, 3 of 6 cycles Dose modification: 140 mg/m2 (80 % of original dose 175 mg/m2, Cycle 1, Reason: Dose Not Tolerated) Administration: 276 mg (07/10/2018), 276 mg (07/31/2018), 276 mg (08/24/2018) fosaprepitant (EMEND) 150 mg, dexamethasone (DECADRON) 12 mg in sodium chloride 0.9 % 145 mL IVPB, , Intravenous,  Once, 3 of 6 cycles Administration:  (07/10/2018),  (07/31/2018),  (08/24/2018)  for chemotherapy treatment.    11/06/2018 -  Chemotherapy   The patient had pembrolizumab for chemotherapy treatment.     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 (HCC)  06/24/2018 Initial Diagnosis   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 (HCC)   07/10/2018 - 09/14/2018 Chemotherapy   The patient had palonosetron (ALOXI) injection 0.25 mg, 0.25 mg, Intravenous,  Once, 3 of 6 cycles Administration: 0.25 mg (07/10/2018), 0.25 mg (07/31/2018), 0.25 mg (08/24/2018) CARBOplatin (PARAPLATIN) 480 mg in sodium chloride 0.9 % 250 mL chemo infusion, 480 mg (100 % of original dose 482.5 mg), Intravenous,  Once, 3 of 6 cycles Dose modification: 482.5 mg (original dose 482.5 mg, Cycle 1) Administration: 480 mg (07/10/2018), 480 mg (07/31/2018), 480 mg  (08/24/2018) PACLitaxel (TAXOL) 276 mg in sodium chloride 0.9 % 250 mL chemo infusion (> 80mg/m2), 140 mg/m2 = 276 mg (80 % of original dose 175 mg/m2), Intravenous,  Once, 3 of 6 cycles Dose modification: 140 mg/m2 (80 % of original dose 175 mg/m2, Cycle 1, Reason: Dose Not Tolerated) Administration: 276 mg (07/10/2018), 276 mg (07/31/2018), 276 mg (08/24/2018) fosaprepitant (EMEND) 150 mg, dexamethasone (DECADRON) 12 mg in sodium chloride 0.9 % 145 mL IVPB, , Intravenous,  Once, 3 of 6 cycles Administration:  (07/10/2018),  (07/31/2018),  (08/24/2018)  for chemotherapy treatment.    11/06/2018 -  Chemotherapy   The patient had pembrolizumab for chemotherapy treatment.     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       REVIEW OF SYSTEMS:   Constitutional: Denies fevers, chills or abnormal weight loss Eyes: Denies blurriness of vision Ears, nose, mouth, throat, and face: Denies mucositis or sore throat Respiratory: Denies cough, dyspnea or wheezes Cardiovascular: Denies palpitation, chest discomfort or lower extremity swelling Gastrointestinal:  Denies nausea, heartburn or change in bowel   habits Skin: Denies abnormal skin rashes Lymphatics: Denies new lymphadenopathy or easy bruising Neurological:Denies numbness, tingling or new weaknesses Behavioral/Psych: Mood is stable, no new changes  All other systems were reviewed with the patient and are negative.  I have reviewed the past medical history, past surgical history, social history and family history with the patient and they are unchanged from previous note.  ALLERGIES:  has No Known Allergies.  MEDICATIONS:  Current Outpatient Medications  Medication Sig Dispense Refill  . clopidogrel  (PLAVIX) 75 MG tablet Take 1 tablet (75 mg total) by mouth daily with breakfast. 30 tablet 1  . Eliquis DVT/PE Starter Pack (ELIQUIS STARTER PACK) 5 MG TABS Take as directed on package: start with two-79m tablets twice daily for 7 days. On day 8, switch to one-579mtablet twice daily. 1 each 0  . methadone (DOLOPHINE) 10 MG tablet Take 2 tablets (20 mg total) by mouth every 12 (twelve) hours. 60 tablet 0  . morphine (MSIR) 30 MG tablet Take 1 tablet (30 mg total) by mouth every 6 (six) hours as needed for severe pain. 60 tablet 0  . Olopatadine HCl 0.2 % SOLN Place 1 drop into both eyes daily.    . ondansetron (ZOFRAN ODT) 4 MG disintegrating tablet Take 1 tablet (4 mg total) by mouth every 8 (eight) hours as needed. 10 tablet 0   No current facility-administered medications for this visit.    Facility-Administered Medications Ordered in Other Visits  Medication Dose Route Frequency Provider Last Rate Last Dose  . heparin lock flush 100 unit/mL  500 Units Intracatheter Once PRN GoAlvy BimlerNi, MD      . pembrolizumab (KEYTRUDA) 200 mg in sodium chloride 0.9 % 50 mL chemo infusion  200 mg Intravenous Once Maurine Mowbray, MD      . sodium chloride flush (NS) 0.9 % injection 10 mL  10 mL Intracatheter PRN GoAlvy BimlerNi, MD        PHYSICAL EXAMINATION: ECOG PERFORMANCE STATUS: 2 - Symptomatic, <50% confined to bed  Vitals:   01/08/19 1037  BP: 120/86  Pulse: 87  Resp: 18  Temp: 98.2 F (36.8 C)  SpO2: 100%   Filed Weights   01/08/19 1037  Weight: 181 lb 12.8 oz (82.5 kg)    GENERAL:alert, no distress and comfortable SKIN: skin color, texture, turgor are normal, no rashes or significant lesions EYES: normal, Conjunctiva are pink and non-injected, sclera clear OROPHARYNX:no exudate, no erythema and lips, buccal mucosa, and tongue normal  NECK: supple, thyroid normal size, non-tender, without nodularity LYMPH:  no palpable lymphadenopathy in the cervical, axillary or inguinal LUNGS: clear to  auscultation and percussion with normal breathing effort HEART: regular rate & rhythm and no murmurs and no lower extremity edema ABDOMEN:abdomen soft, non-tender and normal bowel sounds Musculoskeletal:no cyanosis of digits and no clubbing  NEURO: alert & oriented x 3 with fluent speech, no focal motor/sensory deficits  LABORATORY DATA:  I have reviewed the data as listed    Component Value Date/Time   NA 137 01/08/2019 0924   K 3.8 01/08/2019 0924   CL 105 01/08/2019 0924   CO2 25 01/08/2019 0924   GLUCOSE 94 01/08/2019 0924   BUN 16 01/08/2019 0924   CREATININE 0.67 01/08/2019 0924   CALCIUM 9.4 01/08/2019 0924   PROT 7.4 01/08/2019 0924   ALBUMIN 3.8 01/08/2019 0924   AST 10 (L) 01/08/2019 0924   ALT 8 01/08/2019 0924   ALKPHOS 94 01/08/2019 0924   BILITOT 0.4 01/08/2019 091941  GFRNONAA >60 01/08/2019 0924   GFRAA >60 01/08/2019 0924    No results found for: SPEP, UPEP  Lab Results  Component Value Date   WBC 2.9 (L) 01/08/2019   NEUTROABS 2.0 01/08/2019   HGB 9.8 (L) 01/08/2019   HCT 31.0 (L) 01/08/2019   MCV 90.6 01/08/2019   PLT 229 01/08/2019      Chemistry      Component Value Date/Time   NA 137 01/08/2019 0924   K 3.8 01/08/2019 0924   CL 105 01/08/2019 0924   CO2 25 01/08/2019 0924   BUN 16 01/08/2019 0924   CREATININE 0.67 01/08/2019 0924      Component Value Date/Time   CALCIUM 9.4 01/08/2019 0924   ALKPHOS 94 01/08/2019 0924   AST 10 (L) 01/08/2019 0924   ALT 8 01/08/2019 0924   BILITOT 0.4 01/08/2019 0924       All questions were answered. The patient knows to call the clinic with any problems, questions or concerns. No barriers to learning was detected.  I spent 25 minutes counseling the patient face to face. The total time spent in the appointment was 30 minutes and more than 50% was on counseling and review of test results  [name], MD 01/08/2019 11:05 AM

## 2019-01-08 NOTE — Assessment & Plan Note (Signed)
She has poorly controlled pain I recommend increasing morphine sulfate to 30 mg as needed and to add schedule methadone twice a day I will call her next week to assess pain control I warned her about risk of constipation

## 2019-01-08 NOTE — Patient Instructions (Signed)
[facility] Discharge Instructions for Patients Receiving Chemotherapy  Today you received the following chemotherapy agents:  Keytruda.  To help prevent nausea and vomiting after your treatment, we encourage you to take your nausea medication as directed.   If you develop nausea and vomiting that is not controlled by your nausea medication, call the clinic.   BELOW ARE SYMPTOMS THAT SHOULD BE REPORTED IMMEDIATELY:  *FEVER GREATER THAN 100.5 F  *CHILLS WITH OR WITHOUT FEVER  NAUSEA AND VOMITING THAT IS NOT CONTROLLED WITH YOUR NAUSEA MEDICATION  *UNUSUAL SHORTNESS OF BREATH  *UNUSUAL BRUISING OR BLEEDING  TENDERNESS IN MOUTH AND THROAT WITH OR WITHOUT PRESENCE OF ULCERS  *URINARY PROBLEMS  *BOWEL PROBLEMS  UNUSUAL RASH Items with * indicate a potential emergency and should be followed up as soon as possible.  Feel free to call the clinic should you have any questions or concerns. The clinic phone number is [phone].  Please show the CHEMO ALERT CARD at check-in to the Emergency Department and triage nurse.    

## 2019-01-08 NOTE — Patient Instructions (Signed)

## 2019-01-09 LAB — T4: T4, Total: 6.5 ug/dL (ref 4.5–12.0)

## 2019-01-12 ENCOUNTER — Telehealth: Payer: Self-pay

## 2019-01-12 NOTE — Telephone Encounter (Signed)
She called and left a message to call her. She left a cell phone # to call her.  Called and given below message. Her pain is better. She is still having some trouble walking. She has been taking the Morphine only for pain. She just got the Methadone Rx yesterday and will start it today. Instructed to call the office if needed. She verbalized understanding.

## 2019-01-12 NOTE — Telephone Encounter (Signed)
-----   Message from [name], MD sent at 01/12/2019  8:13 AM EST ----- Regarding: pain control Can you call her and ask how is her pain today?

## 2019-01-12 NOTE — Telephone Encounter (Signed)
Called and left a message asking son to call the office. Unable to get Ms. Cuthrell on listed numbers.

## 2019-01-25 ENCOUNTER — Other Ambulatory Visit: Payer: Self-pay | Admitting: Hematology and Oncology

## 2019-01-25 DIAGNOSIS — D539 Nutritional anemia, unspecified: Secondary | ICD-10-CM

## 2019-01-25 DIAGNOSIS — D509 Iron deficiency anemia, unspecified: Secondary | ICD-10-CM | POA: Insufficient documentation

## 2019-01-28 ENCOUNTER — Ambulatory Visit (HOSPITAL_COMMUNITY)
Admission: RE | Admit: 2019-01-28 | Discharge: 2019-01-28 | Disposition: A | Discharge status: Home / Self Care | Payer: Medicare Other | Source: Ambulatory Visit | Attending: Hematology and Oncology | Admitting: Hematology and Oncology

## 2019-01-28 ENCOUNTER — Other Ambulatory Visit: Payer: Self-pay

## 2019-01-28 DIAGNOSIS — C55 Malignant neoplasm of uterus, part unspecified: Secondary | ICD-10-CM | POA: Diagnosis present

## 2019-01-28 MED ORDER — IOHEXOL 300 MG/ML  SOLN
100.0000 mL | Freq: Once | INTRAMUSCULAR | Status: AC | PRN
  Administered 2019-01-28: 100 mL via INTRAVENOUS

## 2019-01-28 MED ORDER — SODIUM CHLORIDE (PF) 0.9 % IJ SOLN
INTRAMUSCULAR | Status: AC
  Filled 2019-01-28: qty 50

## 2019-01-29 ENCOUNTER — Other Ambulatory Visit: Payer: Self-pay | Admitting: Hematology and Oncology

## 2019-01-29 ENCOUNTER — Inpatient Hospital Stay (HOSPITAL_BASED_OUTPATIENT_CLINIC_OR_DEPARTMENT_OTHER): Payer: Medicare Other | Admitting: Hematology and Oncology

## 2019-01-29 ENCOUNTER — Inpatient Hospital Stay: Payer: Medicare Other | Attending: Hematology and Oncology

## 2019-01-29 ENCOUNTER — Inpatient Hospital Stay: Payer: Medicare Other

## 2019-01-29 ENCOUNTER — Other Ambulatory Visit: Payer: Self-pay

## 2019-01-29 ENCOUNTER — Encounter: Payer: Self-pay | Admitting: Hematology and Oncology

## 2019-01-29 DIAGNOSIS — R5381 Other malaise: Secondary | ICD-10-CM | POA: Insufficient documentation

## 2019-01-29 DIAGNOSIS — M7989 Other specified soft tissue disorders: Secondary | ICD-10-CM | POA: Insufficient documentation

## 2019-01-29 DIAGNOSIS — C779 Secondary and unspecified malignant neoplasm of lymph node, unspecified: Secondary | ICD-10-CM | POA: Diagnosis not present

## 2019-01-29 DIAGNOSIS — C55 Malignant neoplasm of uterus, part unspecified: Secondary | ICD-10-CM

## 2019-01-29 DIAGNOSIS — Z5112 Encounter for antineoplastic immunotherapy: Secondary | ICD-10-CM | POA: Diagnosis not present

## 2019-01-29 DIAGNOSIS — G893 Neoplasm related pain (acute) (chronic): Secondary | ICD-10-CM | POA: Diagnosis not present

## 2019-01-29 DIAGNOSIS — C7951 Secondary malignant neoplasm of bone: Secondary | ICD-10-CM

## 2019-01-29 DIAGNOSIS — D61818 Other pancytopenia: Secondary | ICD-10-CM | POA: Diagnosis not present

## 2019-01-29 DIAGNOSIS — Z7189 Other specified counseling: Secondary | ICD-10-CM

## 2019-01-29 DIAGNOSIS — Z79899 Other long term (current) drug therapy: Secondary | ICD-10-CM | POA: Insufficient documentation

## 2019-01-29 DIAGNOSIS — C801 Malignant (primary) neoplasm, unspecified: Secondary | ICD-10-CM

## 2019-01-29 DIAGNOSIS — C774 Secondary and unspecified malignant neoplasm of inguinal and lower limb lymph nodes: Secondary | ICD-10-CM

## 2019-01-29 DIAGNOSIS — C541 Malignant neoplasm of endometrium: Secondary | ICD-10-CM | POA: Insufficient documentation

## 2019-01-29 DIAGNOSIS — D509 Iron deficiency anemia, unspecified: Secondary | ICD-10-CM | POA: Diagnosis not present

## 2019-01-29 DIAGNOSIS — Z86718 Personal history of other venous thrombosis and embolism: Secondary | ICD-10-CM | POA: Diagnosis not present

## 2019-01-29 DIAGNOSIS — I825Z2 Chronic embolism and thrombosis of unspecified deep veins of left distal lower extremity: Secondary | ICD-10-CM

## 2019-01-29 LAB — CMP (CANCER CENTER ONLY)
ALT: 6 U/L (ref 0–44)
AST: 10 U/L — ABNORMAL LOW (ref 15–41)
Albumin: 3.6 g/dL (ref 3.5–5.0)
Alkaline Phosphatase: 88 U/L (ref 38–126)
Anion gap: 7 (ref 5–15)
BUN: 14 mg/dL (ref 8–23)
CO2: 26 mmol/L (ref 22–32)
Calcium: 9.2 mg/dL (ref 8.9–10.3)
Chloride: 105 mmol/L (ref 98–111)
Creatinine: 0.68 mg/dL (ref 0.44–1.00)
GFR, Est AFR Am: >60 mL/min (ref >60)
GFR, Estimated: >60 mL/min (ref >60)
Glucose, Bld: 95 mg/dL (ref 70–99)
Potassium: 3.7 mmol/L (ref 3.5–5.1)
Sodium: 138 mmol/L (ref 135–145)
Total Bilirubin: 0.4 mg/dL (ref 0.3–1.2)
Total Protein: 7.2 g/dL (ref 6.5–8.1)

## 2019-01-29 LAB — CBC WITH DIFFERENTIAL (CANCER CENTER ONLY)
Abs Immature Granulocytes: 0 10*3/uL (ref 0.00–0.07)
Basophils Absolute: 0 10*3/uL (ref 0.0–0.1)
Basophils Relative: 0 %
Eosinophils Absolute: 0.1 10*3/uL (ref 0.0–0.5)
Eosinophils Relative: 2 %
HCT: 30.5 % — ABNORMAL LOW (ref 36.0–46.0)
Hemoglobin: 9.5 g/dL — ABNORMAL LOW (ref 12.0–15.0)
Immature Granulocytes: 0 %
Lymphocytes Relative: 21 %
Lymphs Abs: 0.6 10*3/uL — ABNORMAL LOW (ref 0.7–4.0)
MCH: 27.5 pg (ref 26.0–34.0)
MCHC: 31.1 g/dL (ref 30.0–36.0)
MCV: 88.2 fL (ref 80.0–100.0)
Monocytes Absolute: 0.3 10*3/uL (ref 0.1–1.0)
Monocytes Relative: 12 %
Neutro Abs: 1.9 10*3/uL (ref 1.7–7.7)
Neutrophils Relative %: 65 %
Platelet Count: 255 10*3/uL (ref 150–400)
RBC: 3.46 MIL/uL — ABNORMAL LOW (ref 3.87–5.11)
RDW: 14.5 % (ref 11.5–15.5)
WBC Count: 2.9 10*3/uL — ABNORMAL LOW (ref 4.0–10.5)
nRBC: 0 % (ref 0.0–0.2)

## 2019-01-29 LAB — TSH: TSH: 1.719 u[IU]/mL (ref 0.308–3.960)

## 2019-01-29 MED ORDER — SODIUM CHLORIDE 0.9% FLUSH
10.0000 mL | INTRAVENOUS | Status: DC | PRN
  Administered 2019-01-29: 10 mL
  Filled 2019-01-29: qty 10

## 2019-01-29 MED ORDER — HEPARIN SOD (PORK) LOCK FLUSH 100 UNIT/ML IV SOLN
500.0000 [IU] | Freq: Once | INTRAVENOUS | Status: AC | PRN
  Administered 2019-01-29: 500 [IU]
  Filled 2019-01-29: qty 5

## 2019-01-29 MED ORDER — SODIUM CHLORIDE 0.9 % IV SOLN
200.0000 mg | Freq: Once | INTRAVENOUS | Status: AC
  Administered 2019-01-29: 200 mg via INTRAVENOUS
  Filled 2019-01-29: qty 8

## 2019-01-29 MED ORDER — SODIUM CHLORIDE 0.9% FLUSH
10.0000 mL | Freq: Once | INTRAVENOUS | Status: DC | PRN
  Filled 2019-01-29: qty 10

## 2019-01-29 MED ORDER — SODIUM CHLORIDE 0.9 % IV SOLN
Freq: Once | INTRAVENOUS | Status: AC
  Administered 2019-01-29: 11:00:00 via INTRAVENOUS
  Filled 2019-01-29: qty 250

## 2019-01-29 MED ORDER — SODIUM CHLORIDE 0.9 % IV SOLN
510.0000 mg | Freq: Once | INTRAVENOUS | Status: AC
  Administered 2019-01-29: 510 mg via INTRAVENOUS
  Filled 2019-01-29: qty 510

## 2019-01-29 MED ORDER — SODIUM CHLORIDE 0.9 % IV SOLN
Freq: Once | INTRAVENOUS | Status: DC
  Filled 2019-01-29: qty 250

## 2019-01-29 MED ORDER — METHADONE HCL 10 MG PO TABS: 10.0000 mg | ORAL_TABLET | Freq: Two times a day (BID) | ORAL | 0 refills | Status: DC

## 2019-01-29 NOTE — Assessment & Plan Note (Signed)
Her pain is gradually improving She did not tolerate methadone at 20 mg twice a day I recommend reducing it to 10 mg twice a day along with IR morphine as needed for breakthrough pain She agreed I refill her prescription medicine and I warned her about risk of constipation and nausea

## 2019-01-29 NOTE — Assessment & Plan Note (Signed)
Overall, she has positive response to therapy She tolerated pembrolizumab very well without major side effects The plan would be to continue indefinitely and imaging study every 3 to 4 months

## 2019-01-29 NOTE — Progress Notes (Signed)
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[facility]) The lymph nodes are shrinking on exam We will continue to monitor with serial imaging study  Pancytopenia, acquired [facility]) She was found to have iron deficiency anemia We will proceed with treatment along with intravenous iron infusion  Cancer associated pain Her pain is gradually improving She did not tolerate methadone at 20 mg twice a day I recommend reducing it to 10 mg twice a day along with IR morphine as needed for breakthrough pain She agreed I refill her prescription medicine and I warned her about risk of constipation and nausea  Lower leg DVT (deep venous thromboembolism), chronic, left ([place]) She will continue anticoagulation therapy indefinitely  Physical debility She has profound mobility issue due to pain, DVT and recent treatment to her leg I recommend referral to physical medicine and rehabilitation for assessment and therapy and she agreed   Orders Placed This Encounter  Procedures  . Ambulatory referral to Physical Medicine Rehab    Referral Priority:   Routine    Referral Type:   Rehabilitation    Referral Reason:   Specialty Services Required    Referred to Provider:   Izora Ribas, MD    Requested Specialty:   Physical Medicine and Rehabilitation    Number of Visits Requested:   1    INTERVAL HISTORY: Please see below for problem oriented charting. 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 Uterus +/- tubes/ovaries, neoplastic, with left fallopian tube and ovary - INVASIVE ENDOMETRIOID CARCINOMA (1.5 CM), FIGO GRADE II, ARISING IN A BACKGROUND OF ATYPICAL COMPLEX HYPERPLASIA, CONFINED WITHIN INNER HALF OF THE MYOMETRIUM. - ENDOMETRIAL POLYP WITH ASSOCIATED ATYPICAL COMPLEX HYPERPLASIA. - MYOMETRIUM: LEIOMYOMATA. - CERVIX: BENIGN SQUAMOUS MUCOSA AND ENDOCERVICAL MUCOSA, NO DYSPLASIA OR MALIGNANCY. - LEFT OVARY: BENIGN OVARIAN TISSUE WITH ENDOSALPINGOSIS, NO EVIDENCE OF ATYPIA OR MALIGNANCY. - LEFT FALLOPIAN TUBE: NO HISTOLOGIC ABNORMALITIES. - PLEASE SEE ONCOLOGY TEMPLATE FOR DETAIL. 2. Ovary and fallopian tube, right - BENIGN OVARIAN TISSUE WITH ENDOSALPINGOSIS, NO ATYPIA OR MALIGNANCY. - BENIGN FALLOPIAN TUBAL TISSUE, NO PATHOLOGIC ABNORMALITIES. Microscopic Comment 1. UTERUS Specimen: Uterus, cervix, bilateral ovaries and fallopian tubes Procedure: Total hysterectomy and bilateral salpingo-oophorectomy Lymph node sampling performed: No Specimen integrity: Intact Maximum tumor size (cm): 1.5 cm, glass slide measurement Histologic type: Invasive endometrioid carcinoma Grade: FIGO grade II Myometrial invasion: 1 cm where myometrium is 2.3 cm in thickness Cervical stromal involvement: No Extent of involvement of other organs: No Lymph vascular invasion: Not identified Peritoneal washings: Negative ([id number]) Lymph nodes: number examined N/A; number positive N/A  TNM code: pT1a, pNX 1 oFf 3IGO Stage  (based on pathologic findings, needs clinical correlation): IA  Comments: Sections the endomyometrium away from the grossly identified endometrial polyp show an invasive FIGO grade II endometrioid carcinoma. The tumor is confined within inner half of the myometrium. No angiolymphatic invasion is identified. No cervical stromal involvement is identified. Sections of the grossly identified endometrial polyp show an endometrial polyp with associated atypical compacted hyperplasia with no definitive evidence of carcinoma.   12/07/2017 Imaging   US venous Doppler Right: No evidence of common femoral vein obstruction. Left: Findings consistent with acute deep vein thrombosis involving the left femoral vein, left proximal profunda vein, and left popliteal vein. Unable to adequately interrogate the common femoral and higher, or the calf secondary to significant edema and body habitus   12/07/2017 [facility] Admission   She presented to the ER and was diagnosed with acute DVT   01/18/2018 - 01/21/2018 Hospital Admission   She was admitted to the hospital for management of severe persistent DVT   01/18/2018 Imaging   US venous Doppler Right: No evidence of common femoral vein obstruction. Left: Findings consistent with acute deep vein thrombosis involving the left common femoral vein, and left popliteal vein.   01/19/2018 Surgery   Pre-operative Diagnosis: Subacute DVT with severe post thrombotic syndrome Post-operative diagnosis:  Same Surgeon:  Erlene Quan C. Donzetta Matters, MD Procedure Performed: 1.  Ultrasound-guided cannulation left small saphenous vein 2.  Left lower extremity and central venography 3.  Intravascular ultrasound of left popliteal, femoral, common femoral, external and common iliac veins and IVC 4.  Stent of left common and external iliac veins with 14 x 60 mm Vici 5.  Moderate sedation with fentanyl and Versed for 50 minutes  Indications: 73 year old female with a history of DVT in  October now presents with persistent left lower extremity swelling and ultrasound demonstrating likely persistent DVT.  She has been on Xarelto at this time.  She is now indicated for venogram possible intervention.  Findings: Flow in the left lower extremity was stagnant throughout but by venogram all veins were patent.  There was a focal occlusive area approximately 2 cm in length at the common and external iliac vein junction at the hypogastric on the left.  After stenting and ballooning we had a diameter of 12 millimeters in the stent and venogram demonstrated flow in the lower extremity veins were previously was stagnant and no further residual stenosis in the left common and external iliac vein junction.   04/12/2018 Imaging   US Venous Doppler Right: No evidence of common femoral vein obstruction. Left: There is no evidence of deep vein thrombosis in the lower extremity. However, portions of this examination were limited- see technologist comments above. Left groin: Large hypoechoic area with mixed echoes noted measuring nearly 10 cm. Possible  hematoma versus unknown etiology. Ultrasound characteristics of enlarged lymph nodes noted in the groin.      05/15/2018 Imaging   US Venous Doppler Right: No evidence of deep vein thrombosis in the lower extremity. No indirect evidence of obstruction proximal to the inguinal ligament. Left: No reflux was noted in the common femoral vein , femoral vein in the thigh, popliteal vein, great saphenous vein at the saphenofemoral junction, great saphenous vein at the proximal thigh, great saphenous vein at the mid thigh, great saphenous vein  at the distal thigh, great saphenous vein at the knee, origin of the small saphenous vein, proximal small saphenous vein, and mid small saphenous  vein. There is no evidence of deep vein thrombosis in the lower extremity. There is no evidence of superficial venous thrombosis. No cystic structure found in the popliteal  fossa. Unable to evaluate extension of common femoral vein obstruction proximal to the inguinal ligament.   06/01/2018 Imaging   1. 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 Additionally, there is lucency involving the medial sidewall of the left acetabulum with potential nondisplaced pathologic fracture. Further evaluation with contrast-enhanced pelvic MRI could be performed as clinically indicated. 2. The left pelvic arterial and venous system is encased by this infiltrative left pelvic sidewall mass however while difficult to ascertain, the left external iliac venous stent appears patent.   06/18/2018 Pathology Results   Lymph node for lymphoma, Left Inguinal - METASTATIC ADENOCARCINOMA, SEE COMMENT. Microscopic Comment Immunohistochemistry is positive for cytokeratin 7, PAX8, ER, and PR. Cytokeratin 5/6,and p63 are negative. The immunoprofile along with the patient's history are consistent with a gynecologic primary.   06/18/2018 Surgery   Pre-op Diagnosis: INGUINAL LYMPHADENOPATHY, PELVIC MASS     Procedure(s): EXCISIONAL BIOPSY DEEP LEFT INGUINAL LYMPH NODE  Surgeon(s): Coralie Keens, MD    06/24/2018 Cancer Staging   Staging form: Corpus Uteri - Carcinoma and Carcinosarcoma, AJCC 8th Edition - Clinical: Stage IVB (cT1a, cN2, pM1) - Signed by [name], MD on 06/24/2018    Genetic Testing   Patient has genetic testing done for MMR on pathology from 06/18/2018. Results revealed patient has the following mutation(s): MMR: abnormal   06/29/2018 Procedure   Placement of a subcutaneous port device. Catheter tip at the SVC and right atrium junction.    Genetic Testing   Patient has genetic testing done for MSI on pathology from 06/18/2018. Results revealed patient has the following mutation(s): MSI: High   07/02/2018 PET scan   Previous hysterectomy, with asymmetric focus of  hypermetabolic activity in the left vaginal cuff, suspicious for residual or recurrent carcinoma.  Large hypermetabolic soft tissue mass involving the left pelvic sidewall and acetabulum, consistent with metastatic disease.  No evidence metastatic disease within the abdomen, chest, or neck.   07/09/2018 Tumor Marker   Patient's tumor was tested for the following markers: CA-125 Results of the tumor marker test revealed 9   07/10/2018 - 08/24/2018 Chemotherapy   The patient had carboplatin and taxol x 3 cycles   07/17/2018 Genetic Testing   Negative genetic testing on the common hereditary cancer panel.  The Common Hereditary Gene Panel offered by [facility] includes sequencing and/or deletion duplication testing of the following 48 genes: APC, ATM, AXIN2, BARD1, BMPR1A, BRCA1, BRCA2, BRIP1, CDH1, CDK4, CDKN2A (p14ARF), CDKN2A (p16INK4a), CHEK2, CTNNA1, DICER1, EPCAM (Deletion/duplication testing only), GREM1 (promoter region deletion/duplication testing only), KIT, MEN1, MLH1, MSH2, MSH3, MSH6, MUTYH, NBN, NF1, NHTL1, PALB2, PDGFRA, PMS2, POLD1, POLE, PTEN, RAD50, RAD51C, RAD51D, RNF43, SDHB, SDHC, SDHD, SMAD4, SMARCA4. STK11, TP53, TSC1, TSC2, and VHL.  The following genes were evaluated for sequence changes only: SDHA and HOXB13 c.251G>A variant only. The report date is Jul 17, 2018.    10/03/2018 Imaging   CT abdomen and pelvis 1.  No acute intra-abdominal process. 2. Grossly unchanged left pelvic sidewall mass with osseous involvement of the medial acetabulum. Progressive mild displacement of the associated comminuted pathologic fracture involving the right acetabulum and puboacetabular junction.  3. New venous stents extending from the left common iliac vein origin to the proximal left common femoral vein. The stents are patent.  11/06/2018 -  Chemotherapy   The patient had pembrolizumab for chemotherapy treatment.     01/28/2019 Imaging   1. No substantial interval change in exam. 2.  Interval development of mild fullness in the left intrarenal collecting system and ureter without overt hydronephrosis at this time. 3. Abnormal soft tissue along the left pelvic sidewall has decreased slightly in the interval. 4. Similar appearance of ill-defined fascial planes in the pelvis with some peritoneal thickening along the right pelvic sidewall and potentially involving the sigmoid mesocolon. 5. No substantial ascites.   Metastasis to lymph nodes ([place])  06/23/2018 Initial Diagnosis   Metastasis to lymph nodes ([place])   07/10/2018 - 09/14/2018 Chemotherapy   The patient had palonosetron (ALOXI) injection 0.25 mg, 0.25 mg, Intravenous,  Once, 3 of 6 cycles Administration: 0.25 mg (07/10/2018), 0.25 mg (07/31/2018), 0.25 mg (08/24/2018) CARBOplatin (PARAPLATIN) 480 mg in sodium chloride 0.9 % 250 mL chemo infusion, 480 mg (100 % of original dose 482.5 mg), Intravenous,  Once, 3 of 6 cycles Dose modification: 482.5 mg (original dose 482.5 mg, Cycle 1) Administration: 480 mg (07/10/2018), 480 mg (07/31/2018), 480 mg (08/24/2018) PACLitaxel (TAXOL) 276 mg in sodium chloride 0.9 % 250 mL chemo infusion (> 71m/m2), 140 mg/m2 = 276 mg (80 % of original dose 175 mg/m2), Intravenous,  Once, 3 of 6 cycles Dose modification: 140 mg/m2 (80 % of original dose 175 mg/m2, Cycle 1, Reason: Dose Not Tolerated) Administration: 276 mg (07/10/2018), 276 mg (07/31/2018), 276 mg (08/24/2018) fosaprepitant (EMEND) 150 mg, dexamethasone (DECADRON) 12 mg in sodium chloride 0.9 % 145 mL IVPB, , Intravenous,  Once, 3 of 6 cycles Administration:  (07/10/2018),  (07/31/2018),  (08/24/2018)  for chemotherapy treatment.    11/06/2018 -  Chemotherapy   The patient had pembrolizumab for chemotherapy treatment.     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 [place]  06/24/2018 Initial Diagnosis   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 ([place]   07/10/2018 - 09/14/2018 Chemotherapy   The patient had palonosetron (ALOXI) injection 0.25 mg, 0.25 mg, Intravenous,  Once, 3 of 6  cycles Administration: 0.25 mg (07/10/2018), 0.25 mg (07/31/2018), 0.25 mg (08/24/2018) CARBOplatin (PARAPLATIN) 480 mg in sodium chloride 0.9 % 250 mL chemo infusion, 480 mg (100 % of original dose 482.5 mg), Intravenous,  Once, 3 of 6 cycles Dose modification: 482.5 mg (original dose 482.5 mg, Cycle 1) Administration: 480 mg (07/10/2018), 480 mg (07/31/2018), 480 mg (08/24/2018) PACLitaxel (TAXOL) 276 mg in sodium chloride 0.9 % 250 mL chemo infusion (> 823mm2), 140 mg/m2 = 276 mg (80 % of original dose 175 mg/m2), Intravenous,  Once, 3 of 6 cycles Dose modification: 140 mg/m2 (80 % of original dose 175 mg/m2, Cycle 1, Reason: Dose Not Tolerated) Administration: 276 mg (07/10/2018), 276 mg (07/31/2018), 276 mg (08/24/2018) fosaprepitant (EMEND) 150 mg, dexamethasone (DECADRON) 12 mg in sodium chloride 0.9 % 145 mL IVPB, , Intravenous,  Once, 3 of 6 cycles Administration:  (07/10/2018),  (07/31/2018),  (08/24/2018)  for chemotherapy treatment.    11/06/2018 -  Chemotherapy   The patient had pembrolizumab for chemotherapy treatment.     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       REVIEW OF SYSTEMS:   Constitutional: Denies fevers, chills or abnormal weight loss Eyes: Denies blurriness of vision Ears, nose, mouth, throat, and face: Denies mucositis or sore throat  Respiratory: Denies cough, dyspnea or wheezes Cardiovascular: Denies palpitation, chest discomfort or lower extremity swelling Gastrointestinal:  Denies nausea, heartburn or change in bowel habits Skin: Denies abnormal skin rashes Lymphatics: Denies new lymphadenopathy or easy bruising Neurological:Denies numbness, tingling or new weaknesses Behavioral/Psych: Mood is stable, no new changes   All other systems were reviewed with the patient and are negative.  I have reviewed the past medical history, past surgical history, social history and family history with the patient and they are unchanged from previous note.  ALLERGIES:  has No Known Allergies.  MEDICATIONS:  Current Outpatient Medications  Medication Sig Dispense Refill  . clopidogrel (PLAVIX) 75 MG tablet Take 1 tablet (75 mg total) by mouth daily with breakfast. 30 tablet 1  . Eliquis DVT/PE Starter Pack (ELIQUIS STARTER PACK) 5 MG TABS Take as directed on package: start with two-54m tablets twice daily for 7 days. On day 8, switch to one-532mtablet twice daily. 1 each 0  . methadone (DOLOPHINE) 10 MG tablet Take 1 tablet (10 mg total) by mouth every 12 (twelve) hours. 90 tablet 0  . morphine (MSIR) 30 MG tablet Take 1 tablet (30 mg total) by mouth every 6 (six) hours as needed for severe pain. 60 tablet 0  . Olopatadine HCl 0.2 % SOLN Place 1 drop into both eyes daily.    . ondansetron (ZOFRAN ODT) 4 MG disintegrating tablet Take 1 tablet (4 mg total) by mouth every 8 (eight) hours as needed. 10 tablet 0   No current facility-administered medications for this visit.    PHYSICAL EXAMINATION: ECOG PERFORMANCE STATUS: 2 - Symptomatic, <50% confined to bed  Vitals:   01/29/19 1043  BP: 120/68  Pulse: 87  Resp: 18  Temp: 98.2 F (36.8 C)  SpO2: 100%   Filed Weights   01/29/19 1043  Weight: 186 lb 14.4 oz (84.8 kg)    GENERAL:alert, no distress and comfortable SKIN: skin color, texture, turgor are normal, no rashes or significant lesions EYES: normal, Conjunctiva are pink and non-injected, sclera clear OROPHARYNX:no exudate, no erythema and lips, buccal mucosa, and tongue normal  NECK: supple, thyroid normal size, non-tender, without nodularity LYMPH:  no palpable lymphadenopathy in the cervical, axillary or inguinal LUNGS: clear to auscultation and percussion with normal breathing effort HEART: regular  rate & rhythm and no murmurs with chronic bilateral lower extremity edema ABDOMEN:abdomen soft, non-tender and normal bowel sounds Musculoskeletal:no cyanosis of digits and no clubbing  NEURO: alert & oriented x 3 with fluent speech, no focal motor/sensory deficits  LABORATORY DATA:  I have reviewed the data as listed    Component Value Date/Time   NA 138 01/29/2019 1005   K 3.7 01/29/2019 1005   CL 105 01/29/2019 1005   CO2 26 01/29/2019 1005   GLUCOSE 95 01/29/2019 1005   BUN 14 01/29/2019 1005   CREATININE 0.68 01/29/2019 1005   CALCIUM 9.2 01/29/2019 1005   PROT 7.2 01/29/2019 1005   ALBUMIN 3.6 01/29/2019 1005   AST 10 (L) 01/29/2019 1005   ALT 6 01/29/2019 1005   ALKPHOS 88 01/29/2019 1005   BILITOT 0.4 01/29/2019 1005   GFRNONAA >60 01/29/2019 1005   GFRAA >60 01/29/2019 1005    No results found for: SPEP, UPEP  Lab Results  Component Value Date   WBC 2.9 (L) 01/29/2019   NEUTROABS 1.9 01/29/2019   HGB 9.5 (L) 01/29/2019   HCT 30.5 (L) 01/29/2019   MCV 88.2 01/29/2019   PLT 255 01/29/2019  Chemistry      Component Value Date/Time   NA 138 01/29/2019 1005   K 3.7 01/29/2019 1005   CL 105 01/29/2019 1005   CO2 26 01/29/2019 1005   BUN 14 01/29/2019 1005   CREATININE 0.68 01/29/2019 1005      Component Value Date/Time   CALCIUM 9.2 01/29/2019 1005   ALKPHOS 88 01/29/2019 1005   AST 10 (L) 01/29/2019 1005   ALT 6 01/29/2019 1005   BILITOT 0.4 01/29/2019 1005       RADIOGRAPHIC STUDIES: I have personally reviewed the radiological images as listed and agreed with the findings in the report. CT Abdomen Pelvis W Contrast  Result Date: 01/28/2019 CLINICAL DATA:  Endometrial cancer. EXAM: CT ABDOMEN AND PELVIS WITH CONTRAST TECHNIQUE: Multidetector CT imaging of the abdomen and pelvis was performed using the standard protocol following bolus administration of intravenous contrast. CONTRAST:  165m OMNIPAQUE IOHEXOL 300 MG/ML  SOLN COMPARISON:   10/03/2018 FINDINGS: Lower chest: Unremarkable. Hepatobiliary: No suspicious focal abnormality within the liver parenchyma. Gallbladder is surgically absent. No intrahepatic or extrahepatic biliary dilation. Pancreas: No focal mass lesion. No dilatation of the main duct. No intraparenchymal cyst. No peripancreatic edema. Spleen: No splenomegaly. No focal mass lesion. Adrenals/Urinary Tract: No adrenal nodule or mass. Mild fullness left intrarenal collecting system with otherwise unremarkable left kidney. Right kidney unremarkable. Right ureter normal in appearance. Minimal fullness of the left ureter noted. Bladder is nondistended, but irregular bladder wall thickening is suspected. Stomach/Bowel: Stomach is unremarkable. No gastric wall thickening. No evidence of outlet obstruction. Duodenum is normally positioned as is the ligament of Treitz. No small bowel wall thickening. No small bowel dilatation. The terminal ileum is normal. The appendix is normal. Diverticuli are seen scattered along the entire length of the colon without CT findings of diverticulitis. No gross colonic mass. No colonic wall thickening. Vascular/Lymphatic: No abdominal aortic aneurysm. Stents are noted in the left iliac venous system. There is no gastrohepatic or hepatoduodenal ligament lymphadenopathy. No intraperitoneal or retroperitoneal lymphadenopathy. Abnormal soft tissue fullness in the left pelvic sidewall has decreased in the interval measuring 7.5 x 2.0 cm today compared to 8.1 x 3.5 cm previously. Small lymph nodes are seen in the left external iliac chain. Reproductive: Uterus surgically absent.  There is no adnexal mass. Other: Peritoneal thickening in the pelvis is similar along the left pelvic sidewall and sigmoid colon (see image 50/2). Musculoskeletal: Comminuted left acetabular fracture again noted, similar. IMPRESSION: 1. No substantial interval change in exam. 2. Interval development of mild fullness in the left intrarenal  collecting system and ureter without overt hydronephrosis at this time. 3. Abnormal soft tissue along the left pelvic sidewall has decreased slightly in the interval. 4. Similar appearance of ill-defined fascial planes in the pelvis with some peritoneal thickening along the right pelvic sidewall and potentially involving the sigmoid mesocolon. 5. No substantial ascites. Electronically Signed   By: EMisty StanleyM.D.   On: 01/28/2019 10:00    All questions were answered. The patient knows to call the clinic with any problems, questions or concerns. No barriers to learning was detected.  I spent 25 minutes counseling the patient face to face. The total time spent in the appointment was 30 minutes and more than 50% was on counseling and review of test results  NHeath [name] MD 01/29/2019 10:53 AM

## 2019-01-29 NOTE — Patient Instructions (Addendum)
[facility] Discharge Instructions for Patients Receiving Chemotherapy  Today you received the following chemotherapy agents: Keytruda.  To help prevent nausea and vomiting after your treatment, we encourage you to take your nausea medication as directed.   If you develop nausea and vomiting that is not controlled by your nausea medication, call the clinic.   BELOW ARE SYMPTOMS THAT SHOULD BE REPORTED IMMEDIATELY:  *FEVER GREATER THAN 100.5 F  *CHILLS WITH OR WITHOUT FEVER  NAUSEA AND VOMITING THAT IS NOT CONTROLLED WITH YOUR NAUSEA MEDICATION  *UNUSUAL SHORTNESS OF BREATH  *UNUSUAL BRUISING OR BLEEDING  TENDERNESS IN MOUTH AND THROAT WITH OR WITHOUT PRESENCE OF ULCERS  *URINARY PROBLEMS  *BOWEL PROBLEMS  UNUSUAL RASH Items with * indicate a potential emergency and should be followed up as soon as possible.  Feel free to call the clinic should you have any questions or concerns. The clinic phone number is [phone].  Please show the [facility] at check-in to the Emergency Department and triage nurse.  Ferumoxytol injection What is this medicine? FERUMOXYTOL is an iron complex. Iron is used to make healthy red blood cells, which carry oxygen and nutrients throughout the body. This medicine is used to treat iron deficiency anemia. This medicine may be used for other purposes; ask your health care provider or pharmacist if you have questions. COMMON BRAND NAME(S): Feraheme What should I tell my health care provider before I take this medicine? They need to know if you have any of these conditions:  anemia not caused by low iron levels  high levels of iron in the blood  magnetic resonance imaging (MRI) test scheduled  an unusual or allergic reaction to iron, other medicines, foods, dyes, or preservatives  pregnant or trying to get pregnant  breast-feeding How should I use this medicine? This medicine is for injection into a vein. It is given  by a health care professional in a hospital or clinic setting. Talk to your pediatrician regarding the use of this medicine in children. Special care may be needed. Overdosage: If you think you have taken too much of this medicine contact a poison control center or emergency room at once. NOTE: This medicine is only for you. Do not share this medicine with others. What if I miss a dose? It is important not to miss your dose. Call your doctor or health care professional if you are unable to keep an appointment. What may interact with this medicine? This medicine may interact with the following medications:  other iron products This list may not describe all possible interactions. Give your health care provider a list of all the medicines, herbs, non-prescription drugs, or dietary supplements you use. Also tell them if you smoke, drink alcohol, or use illegal drugs. Some items may interact with your medicine. What should I watch for while using this medicine? Visit your doctor or healthcare professional regularly. Tell your doctor or healthcare professional if your symptoms do not start to get better or if they get worse. You may need blood work done while you are taking this medicine. You may need to follow a special diet. Talk to your doctor. Foods that contain iron include: whole grains/cereals, dried fruits, beans, or peas, leafy green vegetables, and organ meats (liver, kidney). What side effects may I notice from receiving this medicine? Side effects that you should report to your doctor or health care professional as soon as possible:  allergic reactions like skin rash, itching or hives, swelling of  the face, lips, or tongue  breathing problems  changes in blood pressure  feeling faint or lightheaded, falls  fever or chills  flushing, sweating, or hot feelings  swelling of the ankles or feet Side effects that usually do not require medical attention (report to your doctor or health  care professional if they continue or are bothersome):  diarrhea  headache  nausea, vomiting  stomach pain This list may not describe all possible side effects. Call your doctor for medical advice about side effects. You may report side effects to FDA at 1-800-FDA-1088. Where should I keep my medicine? This drug is given in a hospital or clinic and will not be stored at home. NOTE: This sheet is a summary. It may not cover all possible information. If you have questions about this medicine, talk to your doctor, pharmacist, or health care provider.  2020 [employer] Standard (2016-03-25 20:21:10)

## 2019-01-29 NOTE — Assessment & Plan Note (Signed)
She was found to have iron deficiency anemia We will proceed with treatment along with intravenous iron infusion

## 2019-01-29 NOTE — Assessment & Plan Note (Signed)
She will continue anticoagulation therapy indefinitely 

## 2019-01-29 NOTE — Assessment & Plan Note (Signed)
She has profound mobility issue due to pain, DVT and recent treatment to her leg I recommend referral to physical medicine and rehabilitation for assessment and therapy and she agreed

## 2019-01-29 NOTE — Assessment & Plan Note (Signed)
The lymph nodes are shrinking on exam We will continue to monitor with serial imaging study

## 2019-01-30 LAB — T4: T4, Total: 7.4 ug/dL (ref 4.5–12.0)

## 2019-02-04 ENCOUNTER — Telehealth: Payer: Self-pay | Admitting: Hematology and Oncology

## 2019-02-04 NOTE — Telephone Encounter (Signed)
Scheduled appt per 12/11 sch message - pt is aware of appt date and time   

## 2019-02-26 ENCOUNTER — Telehealth: Payer: Self-pay | Admitting: Hematology and Oncology

## 2019-02-26 ENCOUNTER — Encounter: Payer: Self-pay | Admitting: Hematology and Oncology

## 2019-02-26 ENCOUNTER — Inpatient Hospital Stay (HOSPITAL_BASED_OUTPATIENT_CLINIC_OR_DEPARTMENT_OTHER): Payer: Medicare Other | Admitting: Hematology and Oncology

## 2019-02-26 ENCOUNTER — Inpatient Hospital Stay: Payer: Medicare Other

## 2019-02-26 ENCOUNTER — Other Ambulatory Visit: Payer: Self-pay

## 2019-02-26 ENCOUNTER — Inpatient Hospital Stay: Payer: Medicare Other | Attending: Hematology and Oncology

## 2019-02-26 DIAGNOSIS — D61818 Other pancytopenia: Secondary | ICD-10-CM

## 2019-02-26 DIAGNOSIS — C541 Malignant neoplasm of endometrium: Secondary | ICD-10-CM | POA: Diagnosis present

## 2019-02-26 DIAGNOSIS — C801 Malignant (primary) neoplasm, unspecified: Secondary | ICD-10-CM

## 2019-02-26 DIAGNOSIS — C7951 Secondary malignant neoplasm of bone: Secondary | ICD-10-CM | POA: Insufficient documentation

## 2019-02-26 DIAGNOSIS — C774 Secondary and unspecified malignant neoplasm of inguinal and lower limb lymph nodes: Secondary | ICD-10-CM

## 2019-02-26 DIAGNOSIS — C55 Malignant neoplasm of uterus, part unspecified: Secondary | ICD-10-CM | POA: Diagnosis not present

## 2019-02-26 DIAGNOSIS — Z7189 Other specified counseling: Secondary | ICD-10-CM

## 2019-02-26 DIAGNOSIS — Z79899 Other long term (current) drug therapy: Secondary | ICD-10-CM | POA: Insufficient documentation

## 2019-02-26 DIAGNOSIS — G893 Neoplasm related pain (acute) (chronic): Secondary | ICD-10-CM | POA: Insufficient documentation

## 2019-02-26 DIAGNOSIS — Z7901 Long term (current) use of anticoagulants: Secondary | ICD-10-CM | POA: Diagnosis not present

## 2019-02-26 DIAGNOSIS — Z5112 Encounter for antineoplastic immunotherapy: Secondary | ICD-10-CM | POA: Insufficient documentation

## 2019-02-26 LAB — TSH: TSH: 1.042 u[IU]/mL (ref 0.308–3.960)

## 2019-02-26 LAB — CBC WITH DIFFERENTIAL (CANCER CENTER ONLY)
Abs Immature Granulocytes: 0.01 10*3/uL (ref 0.00–0.07)
Basophils Absolute: 0 10*3/uL (ref 0.0–0.1)
Basophils Relative: 0 %
Eosinophils Absolute: 0 10*3/uL (ref 0.0–0.5)
Eosinophils Relative: 2 %
HCT: 32 % — ABNORMAL LOW (ref 36.0–46.0)
Hemoglobin: 10.3 g/dL — ABNORMAL LOW (ref 12.0–15.0)
Immature Granulocytes: 0 %
Lymphocytes Relative: 25 %
Lymphs Abs: 0.7 10*3/uL (ref 0.7–4.0)
MCH: 28.1 pg (ref 26.0–34.0)
MCHC: 32.2 g/dL (ref 30.0–36.0)
MCV: 87.2 fL (ref 80.0–100.0)
Monocytes Absolute: 0.3 10*3/uL (ref 0.1–1.0)
Monocytes Relative: 10 %
Neutro Abs: 1.7 10*3/uL (ref 1.7–7.7)
Neutrophils Relative %: 63 %
Platelet Count: 219 10*3/uL (ref 150–400)
RBC: 3.67 MIL/uL — ABNORMAL LOW (ref 3.87–5.11)
RDW: 16 % — ABNORMAL HIGH (ref 11.5–15.5)
WBC Count: 2.7 10*3/uL — ABNORMAL LOW (ref 4.0–10.5)
nRBC: 0 % (ref 0.0–0.2)

## 2019-02-26 LAB — CMP (CANCER CENTER ONLY)
ALT: 7 U/L (ref 0–44)
AST: 10 U/L — ABNORMAL LOW (ref 15–41)
Albumin: 3.9 g/dL (ref 3.5–5.0)
Alkaline Phosphatase: 106 U/L (ref 38–126)
Anion gap: 7 (ref 5–15)
BUN: 15 mg/dL (ref 8–23)
CO2: 25 mmol/L (ref 22–32)
Calcium: 9 mg/dL (ref 8.9–10.3)
Chloride: 105 mmol/L (ref 98–111)
Creatinine: 0.7 mg/dL (ref 0.44–1.00)
GFR, Est AFR Am: >60 mL/min (ref >60)
GFR, Estimated: >60 mL/min (ref >60)
Glucose, Bld: 93 mg/dL (ref 70–99)
Potassium: 4 mmol/L (ref 3.5–5.1)
Sodium: 137 mmol/L (ref 135–145)
Total Bilirubin: 0.4 mg/dL (ref 0.3–1.2)
Total Protein: 7.2 g/dL (ref 6.5–8.1)

## 2019-02-26 MED ORDER — SODIUM CHLORIDE 0.9 % IV SOLN
Freq: Once | INTRAVENOUS | Status: AC
  Filled 2019-02-26: qty 250

## 2019-02-26 MED ORDER — MORPHINE SULFATE 15 MG PO TABS: 15.0000 mg | ORAL_TABLET | Freq: Four times a day (QID) | ORAL | Status: DC | PRN

## 2019-02-26 MED ORDER — SODIUM CHLORIDE 0.9 % IV SOLN
200.0000 mg | Freq: Once | INTRAVENOUS | Status: AC
  Administered 2019-02-26: 200 mg via INTRAVENOUS
  Filled 2019-02-26: qty 8

## 2019-02-26 MED ORDER — SODIUM CHLORIDE 0.9% FLUSH
10.0000 mL | INTRAVENOUS | Status: DC | PRN
  Filled 2019-02-26: qty 10

## 2019-02-26 MED ORDER — SODIUM CHLORIDE 0.9% FLUSH
10.0000 mL | Freq: Once | INTRAVENOUS | Status: AC
  Administered 2019-02-26: 10 mL
  Filled 2019-02-26: qty 10

## 2019-02-26 MED ORDER — HEPARIN SOD (PORK) LOCK FLUSH 100 UNIT/ML IV SOLN
500.0000 [IU] | Freq: Once | INTRAVENOUS | Status: DC | PRN
  Filled 2019-02-26: qty 5

## 2019-02-26 MED ORDER — SODIUM CHLORIDE 0.9 % IV SOLN
510.0000 mg | Freq: Once | INTRAVENOUS | Status: AC
  Administered 2019-02-26: 510 mg via INTRAVENOUS
  Filled 2019-02-26: qty 510

## 2019-02-26 NOTE — Assessment & Plan Note (Signed)
Her pain is gradually improving She tolerated methadone well I plan to reduce immediate release morphine I recommend reducing it to 10 mg twice a day along with IR morphine as needed for breakthrough pain She agreed We discussed narcotic refill policy

## 2019-02-26 NOTE — Progress Notes (Signed)
[name] OFFICE PROGRESS NOTE  Patient Care Team: Nolene Ebbs, MD as PCP - General (Internal Medicine)  ASSESSMENT & PLAN:  Uterine cancer ([name]) Overall, she has positive response to therapy She tolerated pembrolizumab very well without major side effects The plan would be to continue indefinitely and imaging study every 3 to 4 months  Pancytopenia, acquired ([name]) She was found to have iron deficiency anemia and borderline vitamin B12 deficiency We will proceed with treatment along with intravenous iron infusion  Cancer associated pain Her pain is gradually improving She tolerated methadone well I plan to reduce immediate release morphine I recommend reducing it to 10 mg twice a day along with IR morphine as needed for breakthrough pain She agreed We discussed narcotic refill policy   No orders of the defined types were placed in this encounter.   All questions were answered. The patient knows to call the clinic with any problems, questions or concerns. The total time spent in the appointment was 20 minutes encounter with patients including review of chart and various tests results, discussions about plan of care and coordination of care plan   [name], MD 02/26/2019 3:13 PM  INTERVAL HISTORY: Please see below for problem oriented charting. 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 Uterus +/- tubes/ovaries, neoplastic, with left fallopian tube and ovary - INVASIVE ENDOMETRIOID CARCINOMA (1.5  CM), FIGO GRADE II, ARISING IN A BACKGROUND OF ATYPICAL COMPLEX HYPERPLASIA, CONFINED WITHIN INNER HALF OF THE MYOMETRIUM. - ENDOMETRIAL POLYP WITH ASSOCIATED ATYPICAL COMPLEX HYPERPLASIA. - MYOMETRIUM: LEIOMYOMATA. - CERVIX: BENIGN SQUAMOUS MUCOSA AND ENDOCERVICAL MUCOSA, NO DYSPLASIA OR MALIGNANCY. - LEFT OVARY: BENIGN OVARIAN TISSUE WITH ENDOSALPINGOSIS, NO EVIDENCE OF ATYPIA OR MALIGNANCY. - LEFT FALLOPIAN TUBE: NO HISTOLOGIC ABNORMALITIES. - PLEASE SEE ONCOLOGY TEMPLATE FOR DETAIL. 2. Ovary and fallopian tube, right - BENIGN OVARIAN TISSUE WITH ENDOSALPINGOSIS, NO ATYPIA OR MALIGNANCY. - BENIGN FALLOPIAN TUBAL TISSUE, NO PATHOLOGIC ABNORMALITIES. Microscopic Comment 1. UTERUS Specimen: Uterus, cervix, bilateral ovaries and fallopian tubes Procedure: Total hysterectomy and bilateral salpingo-oophorectomy Lymph node sampling performed: No Specimen integrity: Intact Maximum tumor size (cm): 1.5 cm, glass slide measurement Histologic type: Invasive endometrioid carcinoma Grade: FIGO grade II Myometrial invasion: 1 cm where myometrium is 2.3 cm in thickness Cervical stromal involvement: No Extent of involvement of other organs: No Lymph vascular invasion: Not identified Peritoneal washings: Negative ([id number]) Lymph nodes: number examined N/A; number positive N/A TNM code: pT1a, pNX 1 oFf 3IGO Stage (based on pathologic findings, needs clinical correlation): IA  Comments: Sections the endomyometrium away from the grossly identified endometrial polyp show an invasive FIGO grade II endometrioid carcinoma. The tumor is confined within inner half of the myometrium. No angiolymphatic invasion is identified. No cervical stromal involvement is identified. Sections of the grossly identified endometrial polyp show an endometrial polyp with associated atypical compacted hyperplasia with no definitive evidence of carcinoma.   12/07/2017 Imaging   US venous Doppler Right: No evidence of common  femoral vein obstruction. Left: Findings consistent with acute deep vein thrombosis involving the left femoral vein, left  proximal profunda vein, and left popliteal vein. Unable to adequately interrogate the common femoral and higher, or the calf secondary to significant edema and body habitus   12/07/2017 [facility] Admission   She presented to the ER and was diagnosed with acute DVT   01/18/2018 - 01/21/2018 Hospital Admission   She was admitted to the hospital for management of severe persistent DVT   01/18/2018 Imaging   US venous Doppler Right: No evidence of common femoral vein obstruction. Left: Findings consistent with acute deep vein thrombosis involving the left common femoral vein, and left popliteal vein.   01/19/2018 Surgery   Pre-operative Diagnosis: Subacute DVT with severe post thrombotic syndrome Post-operative diagnosis:  Same Surgeon:  Erlene Quan C. Donzetta Matters, MD Procedure Performed: 1.  Ultrasound-guided cannulation left small saphenous vein 2.  Left lower extremity and central venography 3.  Intravascular ultrasound of left popliteal, femoral, common femoral, external and common iliac veins and IVC 4.  Stent of left common and external iliac veins with 14 x 60 mm Vici 5.  Moderate sedation with fentanyl and Versed for 50 minutes  Indications: 74 year old female with a history of DVT in October now presents with persistent left lower extremity swelling and ultrasound demonstrating likely persistent DVT.  She has been on Xarelto at this time.  She is now indicated for venogram possible intervention.  Findings: Flow in the left lower extremity was stagnant throughout but by venogram all veins were patent.  There was a focal occlusive area approximately 2 cm in length at the common and external iliac vein junction at the hypogastric on the left.  After stenting and ballooning we had a diameter of 12 millimeters in the stent and venogram demonstrated flow in the lower extremity  veins were previously was stagnant and no further residual stenosis in the left common and external iliac vein junction.   04/12/2018 Imaging   US Venous Doppler Right: No evidence of common femoral vein obstruction. Left: There is no evidence of deep vein thrombosis in the lower extremity. However, portions of this examination were limited- see technologist comments above. Left groin: Large hypoechoic area with mixed echoes noted measuring nearly 10 cm. Possible  hematoma versus unknown etiology. Ultrasound characteristics of enlarged lymph nodes noted in the groin.      05/15/2018 Imaging   US Venous Doppler Right: No evidence of deep vein thrombosis in the lower extremity. No indirect evidence of obstruction proximal to the inguinal ligament. Left: No reflux was noted in the common femoral vein , femoral vein in the thigh, popliteal vein, great saphenous vein at the saphenofemoral junction, great saphenous vein at the proximal thigh, great saphenous vein at the mid thigh, great saphenous vein  at the distal thigh, great saphenous vein at the knee, origin of the small saphenous vein, proximal small saphenous vein, and mid small saphenous vein. There is no evidence of deep vein thrombosis in the lower extremity. There is no evidence of superficial venous thrombosis. No cystic structure found in the popliteal fossa. Unable to evaluate extension of common femoral vein obstruction proximal to the inguinal ligament.   06/01/2018 Imaging   1. 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 Additionally, there is lucency involving the medial sidewall of the left acetabulum with potential nondisplaced pathologic fracture. Further evaluation with contrast-enhanced pelvic MRI could be performed as clinically indicated. 2. The left pelvic arterial and venous system is encased by this infiltrative  left pelvic  sidewall mass however while difficult to ascertain, the left external iliac venous stent appears patent.   06/18/2018 Pathology Results   Lymph node for lymphoma, Left Inguinal - METASTATIC ADENOCARCINOMA, SEE COMMENT. Microscopic Comment Immunohistochemistry is positive for cytokeratin 7, PAX8, ER, and PR. Cytokeratin 5/6,and p63 are negative. The immunoprofile along with the patient's history are consistent with a gynecologic primary.   06/18/2018 Surgery   Pre-op Diagnosis: INGUINAL LYMPHADENOPATHY, PELVIC MASS     Procedure(s): EXCISIONAL BIOPSY DEEP LEFT INGUINAL LYMPH NODE  Surgeon(s): Coralie Keens, MD    06/24/2018 Cancer Staging   Staging form: Corpus Uteri - Carcinoma and Carcinosarcoma, AJCC 8th Edition - Clinical: Stage IVB (cT1a, cN2, pM1) - Signed by [name], MD on 06/24/2018    Genetic Testing   Patient has genetic testing done for MMR on pathology from 06/18/2018. Results revealed patient has the following mutation(s): MMR: abnormal   06/29/2018 Procedure   Placement of a subcutaneous port device. Catheter tip at the SVC and right atrium junction.    Genetic Testing   Patient has genetic testing done for MSI on pathology from 06/18/2018. Results revealed patient has the following mutation(s): MSI: High   07/02/2018 PET scan   Previous hysterectomy, with asymmetric focus of hypermetabolic activity in the left vaginal cuff, suspicious for residual or recurrent carcinoma.  Large hypermetabolic soft tissue mass involving the left pelvic sidewall and acetabulum, consistent with metastatic disease.  No evidence metastatic disease within the abdomen, chest, or neck.   07/09/2018 Tumor Marker   Patient's tumor was tested for the following markers: CA-125 Results of the tumor marker test revealed 9   07/10/2018 - 08/24/2018 Chemotherapy   The patient had carboplatin and taxol x 3 cycles   07/17/2018 Genetic Testing   Negative genetic testing on the common  hereditary cancer panel.  The Common Hereditary Gene Panel offered by [facility] includes sequencing and/or deletion duplication testing of the following 48 genes: APC, ATM, AXIN2, BARD1, BMPR1A, BRCA1, BRCA2, BRIP1, CDH1, CDK4, CDKN2A (p14ARF), CDKN2A (p16INK4a), CHEK2, CTNNA1, DICER1, EPCAM (Deletion/duplication testing only), GREM1 (promoter region deletion/duplication testing only), KIT, MEN1, MLH1, MSH2, MSH3, MSH6, MUTYH, NBN, NF1, NHTL1, PALB2, PDGFRA, PMS2, POLD1, POLE, PTEN, RAD50, RAD51C, RAD51D, RNF43, SDHB, SDHC, SDHD, SMAD4, SMARCA4. STK11, TP53, TSC1, TSC2, and VHL.  The following genes were evaluated for sequence changes only: SDHA and HOXB13 c.251G>A variant only. The report date is Jul 17, 2018.    10/03/2018 Imaging   CT abdomen and pelvis 1.  No acute intra-abdominal process. 2. Grossly unchanged left pelvic sidewall mass with osseous involvement of the medial acetabulum. Progressive mild displacement of the associated comminuted pathologic fracture involving the right acetabulum and puboacetabular junction.  3. New venous stents extending from the left common iliac vein origin to the proximal left common femoral vein. The stents are patent.   11/06/2018 -  Chemotherapy   The patient had pembrolizumab for chemotherapy treatment.     01/28/2019 Imaging   1. No substantial interval change in exam. 2. Interval development of mild fullness in the left intrarenal collecting system and ureter without overt hydronephrosis at this time. 3. Abnormal soft tissue along the left pelvic sidewall has decreased slightly in the interval. 4. Similar appearance of ill-defined fascial planes in the pelvis with some peritoneal thickening along the right pelvic sidewall and potentially involving the sigmoid mesocolon. 5. No substantial ascites.   Metastasis to lymph nodes ([place])  06/23/2018 Initial Diagnosis   Metastasis to lymph nodes ([place])  07/10/2018 - 09/14/2018 Chemotherapy   The patient had  palonosetron (ALOXI) injection 0.25 mg, 0.25 mg, Intravenous,  Once, 3 of 6 cycles Administration: 0.25 mg (07/10/2018), 0.25 mg (07/31/2018), 0.25 mg (08/24/2018) CARBOplatin (PARAPLATIN) 480 mg in sodium chloride 0.9 % 250 mL chemo infusion, 480 mg (100 % of original dose 482.5 mg), Intravenous,  Once, 3 of 6 cycles Dose modification: 482.5 mg (original dose 482.5 mg, Cycle 1) Administration: 480 mg (07/10/2018), 480 mg (07/31/2018), 480 mg (08/24/2018) PACLitaxel (TAXOL) 276 mg in sodium chloride 0.9 % 250 mL chemo infusion (> 30m/m2), 140 mg/m2 = 276 mg (80 % of original dose 175 mg/m2), Intravenous,  Once, 3 of 6 cycles Dose modification: 140 mg/m2 (80 % of original dose 175 mg/m2, Cycle 1, Reason: Dose Not Tolerated) Administration: 276 mg (07/10/2018), 276 mg (07/31/2018), 276 mg (08/24/2018) fosaprepitant (EMEND) 150 mg, dexamethasone (DECADRON) 12 mg in sodium chloride 0.9 % 145 mL IVPB, , Intravenous,  Once, 3 of 6 cycles Administration:  (07/10/2018),  (07/31/2018),  (08/24/2018)  for chemotherapy treatment.    11/06/2018 -  Chemotherapy   The patient had pembrolizumab for chemotherapy treatment.     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 ([place]  06/24/2018 Initial Diagnosis   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   07/10/2018 - 09/14/2018 Chemotherapy   The patient had palonosetron (ALOXI) injection 0.25 mg, 0.25 mg, Intravenous,  Once, 3 of 6 cycles Administration: 0.25 mg (07/10/2018), 0.25 mg (07/31/2018), 0.25 mg (08/24/2018) CARBOplatin (PARAPLATIN) 480 mg in sodium chloride 0.9 % 250 mL chemo infusion, 480 mg (100 % of original dose 482.5 mg), Intravenous,  Once, 3 of 6 cycles Dose modification: 482.5 mg (original dose 482.5 mg, Cycle 1) Administration: 480 mg (07/10/2018), 480 mg (07/31/2018), 480 mg (08/24/2018) PACLitaxel (TAXOL) 276 mg in sodium chloride 0.9 % 250 mL chemo infusion (> 821mm2), 140 mg/m2 = 276 mg (80 % of original dose 175 mg/m2), Intravenous,  Once, 3 of 6 cycles Dose modification: 140 mg/m2 (80 % of original  dose 175 mg/m2, Cycle 1, Reason: Dose Not Tolerated) Administration: 276 mg (07/10/2018), 276 mg (07/31/2018), 276 mg (08/24/2018) fosaprepitant (EMEND) 150 mg, dexamethasone (DECADRON) 12 mg in sodium chloride 0.9 % 145 mL IVPB, , Intravenous,  Once, 3 of 6 cycles Administration:  (07/10/2018),  (07/31/2018),  (08/24/2018)  for chemotherapy treatment.    11/06/2018 -  Chemotherapy   The patient had pembrolizumab for chemotherapy treatment.     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       REVIEW OF SYSTEMS:   Constitutional: Denies fevers, chills or abnormal weight loss Eyes: Denies blurriness of vision Ears, nose, mouth, throat, and face: Denies mucositis or sore throat Respiratory: Denies cough, dyspnea or wheezes Cardiovascular: Denies palpitation, chest discomfort Gastrointestinal:  Denies nausea, heartburn or change in bowel habits Skin: Denies abnormal skin rashes Lymphatics: Denies new lymphadenopathy or easy bruising Neurological:Denies numbness, tingling or new weaknesses Behavioral/Psych: Mood is stable, no new changes  All other systems were reviewed with the patient and are negative.  I have reviewed the past medical history, past surgical history, social history and family history with the patient and they are unchanged from previous note.  ALLERGIES:  has No Known Allergies.  MEDICATIONS:  Current Outpatient Medications  Medication Sig Dispense Refill  . clopidogrel (PLAVIX) 75 MG tablet Take 1 tablet (  75 mg total) by mouth daily with breakfast. 30 tablet 1  . Eliquis DVT/PE Starter Pack (ELIQUIS STARTER PACK) 5 MG TABS Take as directed on package: start with two-53m tablets twice daily for 7 days. On day 8, switch to  one-543mtablet twice daily. 1 each 0  . methadone (DOLOPHINE) 10 MG tablet Take 1 tablet (10 mg total) by mouth every 12 (twelve) hours. 90 tablet 0  . morphine (MSIR) 15 MG tablet Take 1 tablet (15 mg total) by mouth every 6 (six) hours as needed for severe pain.    . Marland Kitchenlopatadine HCl 0.2 % SOLN Place 1 drop into both eyes daily.    . ondansetron (ZOFRAN ODT) 4 MG disintegrating tablet Take 1 tablet (4 mg total) by mouth every 8 (eight) hours as needed. 10 tablet 0   No current facility-administered medications for this visit.   Facility-Administered Medications Ordered in Other Visits  Medication Dose Route Frequency Provider Last Rate Last Admin  . heparin lock flush 100 unit/mL  500 Units Intracatheter Once PRN GoAlvy BimlerNi, MD      . pembrolizumab (KEYTRUDA) 200 mg in sodium chloride 0.9 % 50 mL chemo infusion  200 mg Intravenous Once Aadhav Uhlig, MD      . sodium chloride flush (NS) 0.9 % injection 10 mL  10 mL Intracatheter PRN GoAlvy BimlerNi, MD        PHYSICAL EXAMINATION: ECOG PERFORMANCE STATUS: 1 - Symptomatic but completely ambulatory  Vitals:   02/26/19 1336  BP: (!) 107/54  Pulse: 93  Resp: 18  Temp: 98.8 F (37.1 C)  SpO2: 100%   Filed Weights   02/26/19 1336  Weight: 168 lb 9.6 oz (76.5 kg)    GENERAL:alert, no distress and comfortable SKIN: skin color, texture, turgor are normal, no rashes or significant lesions EYES: normal, Conjunctiva are pink and non-injected, sclera clear OROPHARYNX:no exudate, no erythema and lips, buccal mucosa, and tongue normal  NECK: supple, thyroid normal size, non-tender, without nodularity LYMPH:  no palpable lymphadenopathy in the cervical, axillary or inguinal LUNGS: clear to auscultation and percussion with normal breathing effort HEART: regular rate & rhythm and no murmurs with moderate bilateral lower extremity edema ABDOMEN:abdomen soft, non-tender and normal bowel sounds Musculoskeletal:no cyanosis of digits and no clubbing   NEURO: alert & oriented x 3 with fluent speech, no focal motor/sensory deficits  LABORATORY DATA:  I have reviewed the data as listed    Component Value Date/Time   NA 137 02/26/2019 1320   K 4.0 02/26/2019 1320   CL 105 02/26/2019 1320   CO2 25 02/26/2019 1320   GLUCOSE 93 02/26/2019 1320   BUN 15 02/26/2019 1320   CREATININE 0.70 02/26/2019 1320   CALCIUM 9.0 02/26/2019 1320   PROT 7.2 02/26/2019 1320   ALBUMIN 3.9 02/26/2019 1320   AST 10 (L) 02/26/2019 1320   ALT 7 02/26/2019 1320   ALKPHOS 106 02/26/2019 1320   BILITOT 0.4 02/26/2019 1320   GFRNONAA >60 02/26/2019 1320   GFRAA >60 02/26/2019 1320    No results found for: SPEP, UPEP  Lab Results  Component Value Date   WBC 2.7 (L) 02/26/2019   NEUTROABS 1.7 02/26/2019   HGB 10.3 (L) 02/26/2019   HCT 32.0 (L) 02/26/2019   MCV 87.2 02/26/2019   PLT 219 02/26/2019      Chemistry      Component Value Date/Time   NA 137 02/26/2019 1320   K 4.0 02/26/2019 1320   CL 105 02/26/2019 1320  CO2 25 02/26/2019 1320   BUN 15 02/26/2019 1320   CREATININE 0.70 02/26/2019 1320      Component Value Date/Time   CALCIUM 9.0 02/26/2019 1320   ALKPHOS 106 02/26/2019 1320   AST 10 (L) 02/26/2019 1320   ALT 7 02/26/2019 1320   BILITOT 0.4 02/26/2019 1320       RADIOGRAPHIC STUDIES: I have personally reviewed the radiological images as listed and agreed with the findings in the report. CT Abdomen Pelvis W Contrast  Result Date: 01/28/2019 CLINICAL DATA:  Endometrial cancer. EXAM: CT ABDOMEN AND PELVIS WITH CONTRAST TECHNIQUE: Multidetector CT imaging of the abdomen and pelvis was performed using the standard protocol following bolus administration of intravenous contrast. CONTRAST:  154m OMNIPAQUE IOHEXOL 300 MG/ML  SOLN COMPARISON:  10/03/2018 FINDINGS: Lower chest: Unremarkable. Hepatobiliary: No suspicious focal abnormality within the liver parenchyma. Gallbladder is surgically absent. No intrahepatic or extrahepatic  biliary dilation. Pancreas: No focal mass lesion. No dilatation of the main duct. No intraparenchymal cyst. No peripancreatic edema. Spleen: No splenomegaly. No focal mass lesion. Adrenals/Urinary Tract: No adrenal nodule or mass. Mild fullness left intrarenal collecting system with otherwise unremarkable left kidney. Right kidney unremarkable. Right ureter normal in appearance. Minimal fullness of the left ureter noted. Bladder is nondistended, but irregular bladder wall thickening is suspected. Stomach/Bowel: Stomach is unremarkable. No gastric wall thickening. No evidence of outlet obstruction. Duodenum is normally positioned as is the ligament of Treitz. No small bowel wall thickening. No small bowel dilatation. The terminal ileum is normal. The appendix is normal. Diverticuli are seen scattered along the entire length of the colon without CT findings of diverticulitis. No gross colonic mass. No colonic wall thickening. Vascular/Lymphatic: No abdominal aortic aneurysm. Stents are noted in the left iliac venous system. There is no gastrohepatic or hepatoduodenal ligament lymphadenopathy. No intraperitoneal or retroperitoneal lymphadenopathy. Abnormal soft tissue fullness in the left pelvic sidewall has decreased in the interval measuring 7.5 x 2.0 cm today compared to 8.1 x 3.5 cm previously. Small lymph nodes are seen in the left external iliac chain. Reproductive: Uterus surgically absent.  There is no adnexal mass. Other: Peritoneal thickening in the pelvis is similar along the left pelvic sidewall and sigmoid colon (see image 50/2). Musculoskeletal: Comminuted left acetabular fracture again noted, similar. IMPRESSION: 1. No substantial interval change in exam. 2. Interval development of mild fullness in the left intrarenal collecting system and ureter without overt hydronephrosis at this time. 3. Abnormal soft tissue along the left pelvic sidewall has decreased slightly in the interval. 4. Similar appearance  of ill-defined fascial planes in the pelvis with some peritoneal thickening along the right pelvic sidewall and potentially involving the sigmoid mesocolon. 5. No substantial ascites. Electronically Signed   By: EMisty StanleyM.D.   On: 01/28/2019 10:00

## 2019-02-26 NOTE — Patient Instructions (Addendum)
[facility] Discharge Instructions for Patients Receiving Chemotherapy  Today you received the following chemotherapy agents: Keytruda.  To help prevent nausea and vomiting after your treatment, we encourage you to take your nausea medication as directed.   If you develop nausea and vomiting that is not controlled by your nausea medication, call the clinic.   BELOW ARE SYMPTOMS THAT SHOULD BE REPORTED IMMEDIATELY:  *FEVER GREATER THAN 100.5 F  *CHILLS WITH OR WITHOUT FEVER  NAUSEA AND VOMITING THAT IS NOT CONTROLLED WITH YOUR NAUSEA MEDICATION  *UNUSUAL SHORTNESS OF BREATH  *UNUSUAL BRUISING OR BLEEDING  TENDERNESS IN MOUTH AND THROAT WITH OR WITHOUT PRESENCE OF ULCERS  *URINARY PROBLEMS  *BOWEL PROBLEMS  UNUSUAL RASH Items with * indicate a potential emergency and should be followed up as soon as possible.  Feel free to call the clinic should you have any questions or concerns. The clinic phone number is [phone].  Please show the [place] at check-in to the Emergency Department and triage nurse.  Ferumoxytol injection What is this medicine? FERUMOXYTOL is an iron complex. Iron is used to make healthy red blood cells, which carry oxygen and nutrients throughout the body. This medicine is used to treat iron deficiency anemia. This medicine may be used for other purposes; ask your health care provider or pharmacist if you have questions. COMMON BRAND NAME(S): Feraheme What should I tell my health care provider before I take this medicine? They need to know if you have any of these conditions:  anemia not caused by low iron levels  high levels of iron in the blood  magnetic resonance imaging (MRI) test scheduled  an unusual or allergic reaction to iron, other medicines, foods, dyes, or preservatives  pregnant or trying to get pregnant  breast-feeding How should I use this medicine? This medicine is for injection into a vein. It is given  by a health care professional in a hospital or clinic setting. Talk to your pediatrician regarding the use of this medicine in children. Special care may be needed. Overdosage: If you think you have taken too much of this medicine contact a poison control center or emergency room at once. NOTE: This medicine is only for you. Do not share this medicine with others. What if I miss a dose? It is important not to miss your dose. Call your doctor or health care professional if you are unable to keep an appointment. What may interact with this medicine? This medicine may interact with the following medications:  other iron products This list may not describe all possible interactions. Give your health care provider a list of all the medicines, herbs, non-prescription drugs, or dietary supplements you use. Also tell them if you smoke, drink alcohol, or use illegal drugs. Some items may interact with your medicine. What should I watch for while using this medicine? Visit your doctor or healthcare professional regularly. Tell your doctor or healthcare professional if your symptoms do not start to get better or if they get worse. You may need blood work done while you are taking this medicine. You may need to follow a special diet. Talk to your doctor. Foods that contain iron include: whole grains/cereals, dried fruits, beans, or peas, leafy green vegetables, and organ meats (liver, kidney). What side effects may I notice from receiving this medicine? Side effects that you should report to your doctor or health care professional as soon as possible:  allergic reactions like skin rash, itching or hives, swelling of  the face, lips, or tongue  breathing problems  changes in blood pressure  feeling faint or lightheaded, falls  fever or chills  flushing, sweating, or hot feelings  swelling of the ankles or feet Side effects that usually do not require medical attention (report to your doctor or health  care professional if they continue or are bothersome):  diarrhea  headache  nausea, vomiting  stomach pain This list may not describe all possible side effects. Call your doctor for medical advice about side effects. You may report side effects to FDA at 1-800-FDA-1088. Where should I keep my medicine? This drug is given in a hospital or clinic and will not be stored at home. NOTE: This sheet is a summary. It may not cover all possible information. If you have questions about this medicine, talk to your doctor, pharmacist, or health care provider.  2020 Elsevier/Gold Standard (2016-03-25 20:21:10)  Ferumoxytol injection What is this medicine? FERUMOXYTOL is an iron complex. Iron is used to make healthy red blood cells, which carry oxygen and nutrients throughout the body. This medicine is used to treat iron deficiency anemia. This medicine may be used for other purposes; ask your health care provider or pharmacist if you have questions. COMMON BRAND NAME(S): Feraheme What should I tell my health care provider before I take this medicine? They need to know if you have any of these conditions:  anemia not caused by low iron levels  high levels of iron in the blood  magnetic resonance imaging (MRI) test scheduled  an unusual or allergic reaction to iron, other medicines, foods, dyes, or preservatives  pregnant or trying to get pregnant  breast-feeding How should I use this medicine? This medicine is for injection into a vein. It is given by a health care professional in a hospital or clinic setting. Talk to your pediatrician regarding the use of this medicine in children. Special care may be needed. Overdosage: If you think you have taken too much of this medicine contact a poison control center or emergency room at once. NOTE: This medicine is only for you. Do not share this medicine with others. What if I miss a dose? It is important not to miss your dose. Call your doctor or  health care professional if you are unable to keep an appointment. What may interact with this medicine? This medicine may interact with the following medications:  other iron products This list may not describe all possible interactions. Give your health care provider a list of all the medicines, herbs, non-prescription drugs, or dietary supplements you use. Also tell them if you smoke, drink alcohol, or use illegal drugs. Some items may interact with your medicine. What should I watch for while using this medicine? Visit your doctor or healthcare professional regularly. Tell your doctor or healthcare professional if your symptoms do not start to get better or if they get worse. You may need blood work done while you are taking this medicine. You may need to follow a special diet. Talk to your doctor. Foods that contain iron include: whole grains/cereals, dried fruits, beans, or peas, leafy green vegetables, and organ meats (liver, kidney). What side effects may I notice from receiving this medicine? Side effects that you should report to your doctor or health care professional as soon as possible:  allergic reactions like skin rash, itching or hives, swelling of the face, lips, or tongue  breathing problems  changes in blood pressure  feeling faint or lightheaded, falls  fever or  chills  flushing, sweating, or hot feelings  swelling of the ankles or feet Side effects that usually do not require medical attention (report to your doctor or health care professional if they continue or are bothersome):  diarrhea  headache  nausea, vomiting  stomach pain This list may not describe all possible side effects. Call your doctor for medical advice about side effects. You may report side effects to FDA at 1-800-FDA-1088. Where should I keep my medicine? This drug is given in a hospital or clinic and will not be stored at home. NOTE: This sheet is a summary. It may not cover all possible  information. If you have questions about this medicine, talk to your doctor, pharmacist, or health care provider.  2020 Elsevier/Gold Standard (2016-03-25 20:21:10)

## 2019-02-26 NOTE — Patient Instructions (Signed)

## 2019-02-26 NOTE — Assessment & Plan Note (Signed)
She was found to have iron deficiency anemia and borderline vitamin B12 deficiency We will proceed with treatment along with intravenous iron infusion

## 2019-02-26 NOTE — Telephone Encounter (Signed)
Scheduled appt per 1/8 schmessage - pt to get an updated schedule after chemo today

## 2019-02-26 NOTE — Assessment & Plan Note (Signed)
Overall, she has positive response to therapy She tolerated pembrolizumab very well without major side effects The plan would be to continue indefinitely and imaging study every 3 to 4 months

## 2019-02-27 LAB — T4: T4, Total: 7.4 ug/dL (ref 4.5–12.0)

## 2019-03-11 ENCOUNTER — Other Ambulatory Visit: Payer: Self-pay

## 2019-03-11 DIAGNOSIS — I82422 Acute embolism and thrombosis of left iliac vein: Secondary | ICD-10-CM

## 2019-03-12 ENCOUNTER — Ambulatory Visit (HOSPITAL_COMMUNITY)
Admission: RE | Admit: 2019-03-12 | Discharge: 2019-03-12 | Disposition: A | Discharge status: Home / Self Care | Payer: Medicare Other | Source: Ambulatory Visit | Attending: Vascular Surgery | Admitting: Vascular Surgery

## 2019-03-12 ENCOUNTER — Other Ambulatory Visit: Payer: Self-pay

## 2019-03-12 ENCOUNTER — Ambulatory Visit (INDEPENDENT_AMBULATORY_CARE_PROVIDER_SITE_OTHER): Payer: Medicare Other | Admitting: Vascular Surgery

## 2019-03-12 ENCOUNTER — Encounter: Payer: Self-pay | Admitting: Vascular Surgery

## 2019-03-12 VITALS — BP 116/66 | HR 76 | Temp 97.9°F | Ht 61.0 in | Wt 188.9 lb

## 2019-03-12 DIAGNOSIS — I82422 Acute embolism and thrombosis of left iliac vein: Secondary | ICD-10-CM | POA: Diagnosis present

## 2019-03-12 DIAGNOSIS — I87002 Postthrombotic syndrome without complications of left lower extremity: Secondary | ICD-10-CM

## 2019-03-12 NOTE — Progress Notes (Signed)
Patient ID: [name], female   DOB: [birth year], 74 y.o.   MRN: [id number]  Reason for Consult: No chief complaint on file.   Referred by Nolene Ebbs, MD  Subjective:     HPI:  [name] is a 74 y.o. female previous history of left common external leg vein stenting secondary to invasive endometrial cancer.  Patient subsequent underwent radiation had occluded stents and then underwent mechanical thrombectomy with repeat stenting of the left common and external iliac veins down to the common femoral vein.  She was last seen with superficial wound on the left leg and was being followed by wound center.  Patient remains on Plavix and Eliquis.  She continues on chemotherapy currently.  She is walking with the help of a rolling walker slowly getting around.  She has healed her wound with a excellent care at the wound center currently wearing compression stockings religiously.  Past Medical History:  Diagnosis Date  . Acute upper respiratory infection 07/06/2014  . Anemia   . Arthritis    Back   . Colon polyp    Tubular Adenoma   . Cough productive of clear sputum 06/22/2014  . Family history of breast cancer   . GERD (gastroesophageal reflux disease)   . History of right bundle branch block (RBBB)   . HOH (hard of hearing)   . Hypertension    had in the past, is no longer on medication for this and blood pressures are WNL  . 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 Primary localized osteoarthritis of right knee   . Uterine cancer ([place]) 06/23/2018   Family History  Problem Relation Age of Onset  . Arthritis Mother   . Hypertension Mother   . Alzheimer's disease Father   . Diabetes Sister   . Hypertension Sister   . Hypertension Brother   . Stroke Brother   . Hypertension Brother   . Hypertension Sister   . Hypertension Sister   . Hypertension Sister   . Breast cancer Other        Niece  . Breast cancer Niece 28       sister's daughter  . Anesthesia problems Neg Hx   . Hypotension  Neg Hx   . Malignant hyperthermia Neg Hx   . Pseudochol deficiency Neg Hx   . Colon cancer Neg Hx    Past Surgical History:  Procedure Laterality Date  . ABDOMINAL HYSTERECTOMY  2012  . CHOLECYSTECTOMY N/A 03/09/2013   Procedure: LAPAROSCOPIC CHOLECYSTECTOMY;  Surgeon: Gayland Curry, MD;  Location: [facility];  Service: General;  Laterality: N/A;  . COLONOSCOPY W/ BIOPSIES    . IR IMAGING GUIDED PORT INSERTION  06/29/2018  . LARYNGOSCOPY Left 03/14/2017   Procedure: LARYNGOSCOPY;  Surgeon: Helayne Seminole, MD;  Location: [facility];  Service: ENT;  Laterality: Left;  . LOWER EXTREMITY VENOGRAPHY Left 01/19/2018   Procedure: LOWER EXTREMITY VENOGRAPHY;  Surgeon: Waynetta Sandy, MD;  Location: [facility];  Service: Cardiovascular;  Laterality: Left;  . LOWER EXTREMITY VENOGRAPHY N/A 09/08/2018   Procedure: LOWER EXTREMITY VENOGRAPHY;  Surgeon: Waynetta Sandy, MD;  Location: [facility];  Service: Cardiovascular;  Laterality: N/A;  . LYMPH NODE BIOPSY Left 06/18/2018   Procedure: EXCISIONAL BIOPSY LEFT INGUINAL LYMPH NODE;  Surgeon: Coralie Keens, MD;  Location: [facility];  Service: General;  Laterality: Left;  . PERIPHERAL VASCULAR INTERVENTION Left 01/19/2018   Procedure: PERIPHERAL VASCULAR INTERVENTION;  Surgeon: Waynetta Sandy, MD;  Location: [facility];  Service: Cardiovascular;  Laterality: Left;  LEFT ILIAC VENOUS  . PERIPHERAL VASCULAR INTERVENTION Left 09/08/2018   Procedure: PERIPHERAL VASCULAR INTERVENTION;  Surgeon: Waynetta Sandy, MD;  Location: [facility];  Service: Cardiovascular;  Laterality: Left;  lower extremity  . TOTAL KNEE ARTHROPLASTY  04/29/2011   Procedure: TOTAL KNEE ARTHROPLASTY;  Surgeon: Lorn Junes, MD;  Location: [facility];  Service: Orthopedics;  Laterality: Left;  DR [place] THIS CASE  . TOTAL KNEE ARTHROPLASTY Right 07/04/2014   Procedure: TOTAL KNEE ARTHROPLASTY;  Surgeon:  Elsie Saas, MD;  Location: [facility];  Service: Orthopedics;  Laterality: Right;    Short Social History:  Social History   Tobacco Use  . Smoking status: Former Smoker    Years: 1.00    Quit date: 04/22/1988    Years since quitting: 30.9  . Smokeless tobacco: Never Used  Substance Use Topics  . Alcohol use: No    No Known Allergies  Current Outpatient Medications  Medication Sig Dispense Refill  . clopidogrel (PLAVIX) 75 MG tablet Take 1 tablet (75 mg total) by mouth daily with breakfast. 30 tablet 1  . Eliquis DVT/PE Starter Pack (ELIQUIS STARTER PACK) 5 MG TABS Take as directed on package: start with two-5mg  tablets twice daily for 7 days. On day 8, switch to one-5mg  tablet twice daily. 1 each 0  . methadone (DOLOPHINE) 10 MG tablet Take 1 tablet (10 mg total) by mouth every 12 (twelve) hours. 90 tablet 0  . morphine (MSIR) 15 MG tablet Take 1 tablet (15 mg total) by mouth every 6 (six) hours as needed for severe pain.    Marland Kitchen Olopatadine HCl 0.2 % SOLN Place 1 drop into both eyes daily.    . ondansetron (ZOFRAN ODT) 4 MG disintegrating tablet Take 1 tablet (4 mg total) by mouth every 8 (eight) hours as needed. 10 tablet 0   No current facility-administered medications for this visit.    Review of Systems  Constitutional:  Constitutional negative. HENT: HENT negative.  Eyes: Eyes negative.  Respiratory: Respiratory negative.  Cardiovascular: Positive for leg swelling.  GI: Gastrointestinal negative.  Musculoskeletal: Musculoskeletal negative. Positive for leg pain.  Skin: Skin negative.  Neurological: Neurological negative. Hematologic: Hematologic/lymphatic negative.  Psychiatric: Psychiatric negative.        Objective:  Objective  Vitals:   03/12/19 0841  BP: 116/66  Pulse: 76  Temp: 97.9 F (36.6 C)  SpO2: 100%     Physical Exam HENT:     Head: Normocephalic.     Nose: Nose normal.     Mouth/Throat:     Mouth: Mucous membranes are moist.  Eyes:      Pupils: Pupils are equal, round, and reactive to light.  Cardiovascular:     Rate and Rhythm: Normal rate and regular rhythm.  Pulmonary:     Effort: Pulmonary effort is normal.  Abdominal:     General: Abdomen is flat.     Palpations: Abdomen is soft. There is no mass.  Musculoskeletal:        General: Swelling present. Normal range of motion.     Cervical back: Normal range of motion.  Skin:    General: Skin is warm and dry.     Capillary Refill: Capillary refill takes less than 2 seconds.     Comments: Well-healed ulcer left lower extremity  Neurological:     General: No focal deficit present.     Mental  Status: She is alert.  Psychiatric:        Mood and Affect: Mood normal.        Behavior: Behavior normal.        Thought Content: Thought content normal.        Judgment: Judgment normal.     Data:   I have independently interpreted her IVC iliac duplex which demonstrates patent IVC.  Visualization of the distal common iliac and proximal common neck veins as well as distal IVC was difficult given patient habitus.  External and common iliac veins that were visualized appear free of thrombus.     Assessment/Plan:     74 year old female with extensive left lower extremity venous history including stenting followed by thrombectomy after undergoing radiation for endometrial cancer.  Remains on chemotherapy.  She has healed an ulcer on her leg swelling has dramatically improved although she does have some significant pain this may be related to her pelvic mass followed by radiation.  Does not appear to be any thrombus on current duplex.  We will have her follow-up in 9 to 12 months with repeat IVC like duplex.  Certainly if there are issues before this we can see her sooner.  I would plan to remain on Eliquis and Plavix at least while she is undergoing chemotherapy afterwards we can consider transition to Plavix alone.     Waynetta Sandy MD [facility]

## 2019-03-15 ENCOUNTER — Other Ambulatory Visit: Payer: Self-pay | Admitting: *Deleted

## 2019-03-15 DIAGNOSIS — I872 Venous insufficiency (chronic) (peripheral): Secondary | ICD-10-CM

## 2019-03-19 ENCOUNTER — Inpatient Hospital Stay (HOSPITAL_BASED_OUTPATIENT_CLINIC_OR_DEPARTMENT_OTHER): Payer: Medicare Other | Admitting: Hematology and Oncology

## 2019-03-19 ENCOUNTER — Inpatient Hospital Stay: Payer: Medicare Other

## 2019-03-19 ENCOUNTER — Encounter: Payer: Self-pay | Admitting: Hematology and Oncology

## 2019-03-19 ENCOUNTER — Other Ambulatory Visit: Payer: Self-pay

## 2019-03-19 DIAGNOSIS — C801 Malignant (primary) neoplasm, unspecified: Secondary | ICD-10-CM

## 2019-03-19 DIAGNOSIS — Z7189 Other specified counseling: Secondary | ICD-10-CM

## 2019-03-19 DIAGNOSIS — C55 Malignant neoplasm of uterus, part unspecified: Secondary | ICD-10-CM

## 2019-03-19 DIAGNOSIS — D61818 Other pancytopenia: Secondary | ICD-10-CM | POA: Diagnosis not present

## 2019-03-19 DIAGNOSIS — I825Z2 Chronic embolism and thrombosis of unspecified deep veins of left distal lower extremity: Secondary | ICD-10-CM | POA: Diagnosis not present

## 2019-03-19 DIAGNOSIS — G893 Neoplasm related pain (acute) (chronic): Secondary | ICD-10-CM

## 2019-03-19 DIAGNOSIS — C774 Secondary and unspecified malignant neoplasm of inguinal and lower limb lymph nodes: Secondary | ICD-10-CM

## 2019-03-19 DIAGNOSIS — C7951 Secondary malignant neoplasm of bone: Secondary | ICD-10-CM

## 2019-03-19 DIAGNOSIS — C541 Malignant neoplasm of endometrium: Secondary | ICD-10-CM | POA: Diagnosis not present

## 2019-03-19 LAB — CMP (CANCER CENTER ONLY)
ALT: 7 U/L (ref 0–44)
AST: 10 U/L — ABNORMAL LOW (ref 15–41)
Albumin: 3.9 g/dL (ref 3.5–5.0)
Alkaline Phosphatase: 106 U/L (ref 38–126)
Anion gap: 6 (ref 5–15)
BUN: 16 mg/dL (ref 8–23)
CO2: 26 mmol/L (ref 22–32)
Calcium: 9.1 mg/dL (ref 8.9–10.3)
Chloride: 105 mmol/L (ref 98–111)
Creatinine: 0.67 mg/dL (ref 0.44–1.00)
GFR, Est AFR Am: >60 mL/min (ref >60)
GFR, Estimated: >60 mL/min (ref >60)
Glucose, Bld: 107 mg/dL — ABNORMAL HIGH (ref 70–99)
Potassium: 3.9 mmol/L (ref 3.5–5.1)
Sodium: 137 mmol/L (ref 135–145)
Total Bilirubin: 0.4 mg/dL (ref 0.3–1.2)
Total Protein: 7.3 g/dL (ref 6.5–8.1)

## 2019-03-19 LAB — CBC WITH DIFFERENTIAL (CANCER CENTER ONLY)
Abs Immature Granulocytes: 0.01 10*3/uL (ref 0.00–0.07)
Basophils Absolute: 0 10*3/uL (ref 0.0–0.1)
Basophils Relative: 1 %
Eosinophils Absolute: 0.1 10*3/uL (ref 0.0–0.5)
Eosinophils Relative: 2 %
HCT: 34.8 % — ABNORMAL LOW (ref 36.0–46.0)
Hemoglobin: 11 g/dL — ABNORMAL LOW (ref 12.0–15.0)
Immature Granulocytes: 0 %
Lymphocytes Relative: 22 %
Lymphs Abs: 0.7 10*3/uL (ref 0.7–4.0)
MCH: 28.4 pg (ref 26.0–34.0)
MCHC: 31.6 g/dL (ref 30.0–36.0)
MCV: 89.7 fL (ref 80.0–100.0)
Monocytes Absolute: 0.3 10*3/uL (ref 0.1–1.0)
Monocytes Relative: 11 %
Neutro Abs: 1.9 10*3/uL (ref 1.7–7.7)
Neutrophils Relative %: 64 %
Platelet Count: 182 10*3/uL (ref 150–400)
RBC: 3.88 MIL/uL (ref 3.87–5.11)
RDW: 16.7 % — ABNORMAL HIGH (ref 11.5–15.5)
WBC Count: 3 10*3/uL — ABNORMAL LOW (ref 4.0–10.5)
nRBC: 0 % (ref 0.0–0.2)

## 2019-03-19 LAB — TSH: TSH: 1.353 u[IU]/mL (ref 0.308–3.960)

## 2019-03-19 MED ORDER — SODIUM CHLORIDE 0.9% FLUSH
10.0000 mL | INTRAVENOUS | Status: DC | PRN
  Administered 2019-03-19: 10 mL
  Filled 2019-03-19: qty 10

## 2019-03-19 MED ORDER — SODIUM CHLORIDE 0.9% FLUSH
10.0000 mL | Freq: Once | INTRAVENOUS | Status: AC
  Administered 2019-03-19: 10 mL
  Filled 2019-03-19: qty 10

## 2019-03-19 MED ORDER — SODIUM CHLORIDE 0.9 % IV SOLN
200.0000 mg | Freq: Once | INTRAVENOUS | Status: AC
  Administered 2019-03-19: 200 mg via INTRAVENOUS
  Filled 2019-03-19: qty 8

## 2019-03-19 MED ORDER — HEPARIN SOD (PORK) LOCK FLUSH 100 UNIT/ML IV SOLN
500.0000 [IU] | Freq: Once | INTRAVENOUS | Status: AC | PRN
  Administered 2019-03-19: 500 [IU]
  Filled 2019-03-19: qty 5

## 2019-03-19 MED ORDER — SODIUM CHLORIDE 0.9 % IV SOLN
Freq: Once | INTRAVENOUS | Status: AC
  Filled 2019-03-19: qty 250

## 2019-03-19 NOTE — Assessment & Plan Note (Signed)
She will continue anticoagulation therapy indefinitely 

## 2019-03-19 NOTE — Assessment & Plan Note (Signed)
She was found to have iron deficiency anemia and borderline vitamin B12 deficiency After receiving intravenous iron infusion, her blood counts are improving We will continue to monitor closely

## 2019-03-19 NOTE — Patient Instructions (Signed)
[facility] Discharge Instructions for Patients Receiving Chemotherapy  Today you received the following chemotherapy agents :  pembrolizumab (Keytruda)  To help prevent nausea and vomiting after your treatment, we encourage you to take your nausea medication as prescribed.   If you develop nausea and vomiting that is not controlled by your nausea medication, call the clinic.   BELOW ARE SYMPTOMS THAT SHOULD BE REPORTED IMMEDIATELY:  *FEVER GREATER THAN 100.5 F  *CHILLS WITH OR WITHOUT FEVER  NAUSEA AND VOMITING THAT IS NOT CONTROLLED WITH YOUR NAUSEA MEDICATION  *UNUSUAL SHORTNESS OF BREATH  *UNUSUAL BRUISING OR BLEEDING  TENDERNESS IN MOUTH AND THROAT WITH OR WITHOUT PRESENCE OF ULCERS  *URINARY PROBLEMS  *BOWEL PROBLEMS  UNUSUAL RASH Items with * indicate a potential emergency and should be followed up as soon as possible.  Feel free to call the clinic should you have any questions or concerns. The clinic phone number is [phone].  Please show the CHEMO ALERT CARD at check-in to the Emergency Department and triage nurse.   

## 2019-03-19 NOTE — Progress Notes (Signed)
[facility] OFFICE PROGRESS NOTE  Patient Care Team: Nolene Ebbs, MD as PCP - General (Internal Medicine)  ASSESSMENT & PLAN:  Uterine cancer ([place]) Overall, she has positive response to therapy She tolerated pembrolizumab very well without major side effects The plan would be to continue indefinitely and imaging study every 3 to 4 months, next due around March or April  Cancer associated pain Her pain is gradually improving She tolerated methadone well We discussed narcotic refill policy  Pancytopenia, acquired ([place]) She was found to have iron deficiency anemia and borderline vitamin B12 deficiency After receiving intravenous iron infusion, her blood counts are improving We will continue to monitor closely  Lower leg DVT (deep venous thromboembolism), chronic, left ([place]) She will continue anticoagulation therapy indefinitely   No orders of the defined types were placed in this encounter.   All questions were answered. The patient knows to call the clinic with any problems, questions or concerns. The total time spent in the appointment was 20 minutes encounter with patients including review of chart and various tests results, discussions about plan of care and coordination of care plan   [name], MD 03/19/2019 1:14 PM  INTERVAL HISTORY: Please see below for problem oriented charting. 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 Uterus +/- tubes/ovaries, neoplastic, with left  fallopian tube and ovary - INVASIVE ENDOMETRIOID CARCINOMA (1.5 CM), FIGO GRADE II, ARISING IN A BACKGROUND OF ATYPICAL COMPLEX HYPERPLASIA, CONFINED WITHIN INNER HALF OF THE MYOMETRIUM. - ENDOMETRIAL POLYP WITH ASSOCIATED ATYPICAL COMPLEX HYPERPLASIA. - MYOMETRIUM: LEIOMYOMATA. - CERVIX: BENIGN SQUAMOUS MUCOSA AND ENDOCERVICAL MUCOSA, NO DYSPLASIA OR MALIGNANCY. - LEFT OVARY: BENIGN OVARIAN TISSUE WITH ENDOSALPINGOSIS, NO EVIDENCE OF ATYPIA OR MALIGNANCY. - LEFT FALLOPIAN TUBE: NO HISTOLOGIC ABNORMALITIES. - PLEASE SEE ONCOLOGY TEMPLATE FOR DETAIL. 2. Ovary and fallopian tube, right - BENIGN OVARIAN TISSUE WITH ENDOSALPINGOSIS, NO ATYPIA OR MALIGNANCY. - BENIGN FALLOPIAN TUBAL TISSUE, NO PATHOLOGIC ABNORMALITIES. Microscopic Comment 1. UTERUS Specimen: Uterus, cervix, bilateral ovaries and fallopian tubes Procedure: Total hysterectomy and bilateral salpingo-oophorectomy Lymph node sampling performed: No Specimen integrity: Intact Maximum tumor size (cm): 1.5 cm, glass slide measurement Histologic type: Invasive endometrioid carcinoma Grade: FIGO grade II Myometrial invasion: 1 cm where myometrium is 2.3 cm in thickness Cervical stromal involvement: No Extent of involvement of other organs: No Lymph vascular invasion: Not identified Peritoneal washings: Negative ([id number]) Lymph nodes: number examined N/A; number positive N/A TNM code: pT1a, pNX 1 oFf 3IGO Stage (based on pathologic findings, needs clinical correlation): IA  Comments: Sections the endomyometrium away from the grossly identified endometrial polyp show an invasive FIGO grade II endometrioid carcinoma. The tumor is confined within inner half of the myometrium. No angiolymphatic invasion is identified. No cervical stromal involvement is identified. Sections of the grossly identified endometrial polyp show an endometrial polyp with associated atypical compacted hyperplasia with no definitive evidence of carcinoma.    12/07/2017 Imaging   US venous Doppler Right: No evidence of common femoral vein obstruction. Left: Findings consistent with acute deep vein thrombosis involving the  left femoral vein, left proximal profunda vein, and left popliteal vein. Unable to adequately interrogate the common femoral and higher, or the calf secondary to significant edema and body habitus   12/07/2017 [facility] Admission   She presented to the ER and was diagnosed with acute DVT   01/18/2018 - 01/21/2018 Hospital Admission   She was admitted to the hospital for management of severe persistent DVT   01/18/2018 Imaging   US venous Doppler Right: No evidence of common femoral vein obstruction. Left: Findings consistent with acute deep vein thrombosis involving the left common femoral vein, and left popliteal vein.   01/19/2018 Surgery   Pre-operative Diagnosis: Subacute DVT with severe post thrombotic syndrome Post-operative diagnosis:  Same Surgeon:  Erlene Quan C. Donzetta Matters, MD Procedure Performed: 1.  Ultrasound-guided cannulation left small saphenous vein 2.  Left lower extremity and central venography 3.  Intravascular ultrasound of left popliteal, femoral, common femoral, external and common iliac veins and IVC 4.  Stent of left common and external iliac veins with 14 x 60 mm Vici 5.  Moderate sedation with fentanyl and Versed for 50 minutes  Indications: 74 year old female with a history of DVT in October now presents with persistent left lower extremity swelling and ultrasound demonstrating likely persistent DVT.  She has been on Xarelto at this time.  She is now indicated for venogram possible intervention.  Findings: Flow in the left lower extremity was stagnant throughout but by venogram all veins were patent.  There was a focal occlusive area approximately 2 cm in length at the common and external iliac vein junction at the hypogastric on the left.  After stenting and ballooning we had a diameter of 12 millimeters  in the stent and venogram demonstrated flow in the lower extremity veins were previously was stagnant and no further residual stenosis in the left common and external iliac vein junction.   04/12/2018 Imaging   US Venous Doppler Right: No evidence of common femoral vein obstruction. Left: There is no evidence of deep vein thrombosis in the lower extremity. However, portions of this examination were limited- see technologist comments above. Left groin: Large hypoechoic area with mixed echoes noted measuring nearly 10 cm. Possible  hematoma versus unknown etiology. Ultrasound characteristics of enlarged lymph nodes noted in the groin.      05/15/2018 Imaging   US Venous Doppler Right: No evidence of deep vein thrombosis in the lower extremity. No indirect evidence of obstruction proximal to the inguinal ligament. Left: No reflux was noted in the common femoral vein , femoral vein in the thigh, popliteal vein, great saphenous vein at the saphenofemoral junction, great saphenous vein at the proximal thigh, great saphenous vein at the mid thigh, great saphenous vein  at the distal thigh, great saphenous vein at the knee, origin of the small saphenous vein, proximal small saphenous vein, and mid small saphenous vein. There is no evidence of deep vein thrombosis in the lower extremity. There is no evidence of superficial venous thrombosis. No cystic structure found in the popliteal fossa. Unable to evaluate extension of common femoral vein obstruction proximal to the inguinal ligament.   06/01/2018 Imaging   1. 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 Additionally, there is lucency involving the medial sidewall of the left acetabulum with potential nondisplaced pathologic fracture. Further evaluation with contrast-enhanced pelvic MRI could be performed as clinically indicated. 2. The left pelvic arterial  and venous system  is encased by this infiltrative left pelvic sidewall mass however while difficult to ascertain, the left external iliac venous stent appears patent.   06/18/2018 Pathology Results   Lymph node for lymphoma, Left Inguinal - METASTATIC ADENOCARCINOMA, SEE COMMENT. Microscopic Comment Immunohistochemistry is positive for cytokeratin 7, PAX8, ER, and PR. Cytokeratin 5/6,and p63 are negative. The immunoprofile along with the patient's history are consistent with a gynecologic primary.   06/18/2018 Surgery   Pre-op Diagnosis: INGUINAL LYMPHADENOPATHY, PELVIC MASS     Procedure(s): EXCISIONAL BIOPSY DEEP LEFT INGUINAL LYMPH NODE  Surgeon(s): Coralie Keens, MD    06/24/2018 Cancer Staging   Staging form: Corpus Uteri - Carcinoma and Carcinosarcoma, AJCC 8th Edition - Clinical: Stage IVB (cT1a, cN2, pM1) - Signed by [name], MD on 06/24/2018    Genetic Testing   Patient has genetic testing done for MMR on pathology from 06/18/2018. Results revealed patient has the following mutation(s): MMR: abnormal   06/29/2018 Procedure   Placement of a subcutaneous port device. Catheter tip at the SVC and right atrium junction.    Genetic Testing   Patient has genetic testing done for MSI on pathology from 06/18/2018. Results revealed patient has the following mutation(s): MSI: High   07/02/2018 PET scan   Previous hysterectomy, with asymmetric focus of hypermetabolic activity in the left vaginal cuff, suspicious for residual or recurrent carcinoma.  Large hypermetabolic soft tissue mass involving the left pelvic sidewall and acetabulum, consistent with metastatic disease.  No evidence metastatic disease within the abdomen, chest, or neck.   07/09/2018 Tumor Marker   Patient's tumor was tested for the following markers: CA-125 Results of the tumor marker test revealed 9   07/10/2018 - 08/24/2018 Chemotherapy   The patient had carboplatin and taxol x 3 cycles   07/17/2018  Genetic Testing   Negative genetic testing on the common hereditary cancer panel.  The Common Hereditary Gene Panel offered by [facility] includes sequencing and/or deletion duplication testing of the following 48 genes: APC, ATM, AXIN2, BARD1, BMPR1A, BRCA1, BRCA2, BRIP1, CDH1, CDK4, CDKN2A (p14ARF), CDKN2A (p16INK4a), CHEK2, CTNNA1, DICER1, EPCAM (Deletion/duplication testing only), GREM1 (promoter region deletion/duplication testing only), KIT, MEN1, MLH1, MSH2, MSH3, MSH6, MUTYH, NBN, NF1, NHTL1, PALB2, PDGFRA, PMS2, POLD1, POLE, PTEN, RAD50, RAD51C, RAD51D, RNF43, SDHB, SDHC, SDHD, SMAD4, SMARCA4. STK11, TP53, TSC1, TSC2, and VHL.  The following genes were evaluated for sequence changes only: SDHA and HOXB13 c.251G>A variant only. The report date is Jul 17, 2018.    10/03/2018 Imaging   CT abdomen and pelvis 1.  No acute intra-abdominal process. 2. Grossly unchanged left pelvic sidewall mass with osseous involvement of the medial acetabulum. Progressive mild displacement of the associated comminuted pathologic fracture involving the right acetabulum and puboacetabular junction.  3. New venous stents extending from the left common iliac vein origin to the proximal left common femoral vein. The stents are patent.   11/06/2018 -  Chemotherapy   The patient had pembrolizumab for chemotherapy treatment.     01/28/2019 Imaging   1. No substantial interval change in exam. 2. Interval development of mild fullness in the left intrarenal collecting system and ureter without overt hydronephrosis at this time. 3. Abnormal soft tissue along the left pelvic sidewall has decreased slightly in the interval. 4. Similar appearance of ill-defined fascial planes in the pelvis with some peritoneal thickening along the right pelvic sidewall and potentially involving the sigmoid mesocolon. 5. No substantial ascites.   Metastasis to lymph nodes ([place])  06/23/2018 Initial Diagnosis   Metastasis  to lymph nodes ([place])    07/10/2018 - 09/14/2018 Chemotherapy   The patient had palonosetron (ALOXI) injection 0.25 mg, 0.25 mg, Intravenous,  Once, 3 of 6 cycles Administration: 0.25 mg (07/10/2018), 0.25 mg (07/31/2018), 0.25 mg (08/24/2018) CARBOplatin (PARAPLATIN) 480 mg in sodium chloride 0.9 % 250 mL chemo infusion, 480 mg (100 % of original dose 482.5 mg), Intravenous,  Once, 3 of 6 cycles Dose modification: 482.5 mg (original dose 482.5 mg, Cycle 1) Administration: 480 mg (07/10/2018), 480 mg (07/31/2018), 480 mg (08/24/2018) PACLitaxel (TAXOL) 276 mg in sodium chloride 0.9 % 250 mL chemo infusion (> 29m/m2), 140 mg/m2 = 276 mg (80 % of original dose 175 mg/m2), Intravenous,  Once, 3 of 6 cycles Dose modification: 140 mg/m2 (80 % of original dose 175 mg/m2, Cycle 1, Reason: Dose Not Tolerated) Administration: 276 mg (07/10/2018), 276 mg (07/31/2018), 276 mg (08/24/2018) fosaprepitant (EMEND) 150 mg, dexamethasone (DECADRON) 12 mg in sodium chloride 0.9 % 145 mL IVPB, , Intravenous,  Once, 3 of 6 cycles Administration:  (07/10/2018),  (07/31/2018),  (08/24/2018)  for chemotherapy treatment.    11/06/2018 -  Chemotherapy   The patient had pembrolizumab for chemotherapy treatment.     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 ([place]  06/24/2018 Initial Diagnosis   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   07/10/2018 - 09/14/2018 Chemotherapy   The patient had palonosetron (ALOXI) injection 0.25 mg, 0.25 mg, Intravenous,  Once, 3 of 6 cycles Administration: 0.25 mg (07/10/2018), 0.25 mg (07/31/2018), 0.25 mg (08/24/2018) CARBOplatin (PARAPLATIN) 480 mg in sodium chloride 0.9 % 250 mL chemo infusion, 480 mg (100 % of original dose 482.5 mg), Intravenous,  Once, 3 of 6 cycles Dose modification: 482.5 mg (original dose 482.5 mg, Cycle 1) Administration: 480 mg (07/10/2018), 480 mg (07/31/2018), 480 mg (08/24/2018) PACLitaxel (TAXOL) 276 mg in sodium chloride 0.9 % 250 mL chemo infusion (> 817mm2), 140 mg/m2 = 276 mg (80 % of original dose 175 mg/m2), Intravenous,  Once, 3 of 6  cycles Dose modification: 140 mg/m2 (80 % of original dose 175 mg/m2, Cycle 1, Reason: Dose Not Tolerated) Administration: 276 mg (07/10/2018), 276 mg (07/31/2018), 276 mg (08/24/2018) fosaprepitant (EMEND) 150 mg, dexamethasone (DECADRON) 12 mg in sodium chloride 0.9 % 145 mL IVPB, , Intravenous,  Once, 3 of 6 cycles Administration:  (07/10/2018),  (07/31/2018),  (08/24/2018)  for chemotherapy treatment.    11/06/2018 -  Chemotherapy   The patient had pembrolizumab for chemotherapy treatment.     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       REVIEW OF SYSTEMS:   Constitutional: Denies fevers, chills or abnormal weight loss Eyes: Denies blurriness of vision Ears, nose, mouth, throat, and face: Denies mucositis or sore throat Respiratory: Denies cough, dyspnea or wheezes Cardiovascular: Denies palpitation, chest discomfort or lower extremity swelling Gastrointestinal:  Denies nausea, heartburn or change in bowel habits Skin: Denies abnormal skin rashes Lymphatics: Denies new lymphadenopathy or easy bruising Neurological:Denies numbness, tingling or new weaknesses Behavioral/Psych: Mood is stable, no new changes  All other systems were reviewed with the patient and are negative.  I have reviewed the past medical history, past surgical history, social history and family history with the patient and they are unchanged from previous note.  ALLERGIES:  has No Known Allergies.  MEDICATIONS:  Current Outpatient Medications  Medication Sig Dispense Refill  .  clopidogrel (PLAVIX) 75 MG tablet Take 1 tablet (75 mg total) by mouth daily with breakfast. 30 tablet 1  . diclofenac sodium (VOLTAREN) 1 % GEL APPLY 4GRAMS 4 TIMES A DAY AS NEEDED FOR PAINS     . Eliquis DVT/PE Starter Pack (ELIQUIS STARTER PACK) 5 MG TABS Take as directed on package: start with two-30m tablets twice daily for 7 days. On day 8, switch to one-54mtablet twice daily. 1 each 0  . methadone (DOLOPHINE) 10 MG tablet Take 1 tablet (10 mg total) by mouth every 12 (twelve) hours. 90 tablet 0  . morphine (MSIR) 15 MG tablet Take 1 tablet (15 mg total) by mouth every 6 (six) hours as needed for severe pain.    . Marland Kitchenlopatadine HCl 0.2 % SOLN Place 1 drop into both eyes daily.     No current facility-administered medications for this visit.   Facility-Administered Medications Ordered in Other Visits  Medication Dose Route Frequency Provider Last Rate Last Admin  . heparin lock flush 100 unit/mL  500 Units Intracatheter Once PRN GoAlvy BimlerNi, MD      . pembrolizumab (KEYTRUDA) 200 mg in sodium chloride 0.9 % 50 mL chemo infusion  200 mg Intravenous Once GoHeath LarkMD 116 mL/hr at 03/19/19 1305 200 mg at 03/19/19 1305  . sodium chloride flush (NS) 0.9 % injection 10 mL  10 mL Intracatheter PRN GoAlvy BimlerNi, MD        PHYSICAL EXAMINATION: ECOG PERFORMANCE STATUS: 1 - Symptomatic but completely ambulatory  Vitals:   03/19/19 1154  BP: (!) 116/55  Pulse: 90  Resp: 18  Temp: 98.2 F (36.8 C)  SpO2: 100%   Filed Weights   03/19/19 1154  Weight: 189 lb 12.8 oz (86.1 kg)    GENERAL:alert, no distress and comfortable SKIN: skin color, texture, turgor are normal, no rashes or significant lesions EYES: normal, Conjunctiva are pink and non-injected, sclera clear OROPHARYNX:no exudate, no erythema and lips, buccal mucosa, and tongue normal  NECK: supple, thyroid normal size, non-tender, without nodularity LYMPH:  no palpable lymphadenopathy in the cervical, axillary or inguinal LUNGS: clear to auscultation and percussion with normal breathing effort HEART: regular rate & rhythm and no murmurs with stable bilateral lower extremity edema ABDOMEN:abdomen soft, non-tender and  normal bowel sounds Musculoskeletal:no cyanosis of digits and no clubbing  NEURO: alert & oriented x 3 with fluent speech, no focal motor/sensory deficits  LABORATORY DATA:  I have reviewed the data as listed    Component Value Date/Time   NA 137 03/19/2019 1140   K 3.9 03/19/2019 1140   CL 105 03/19/2019 1140   CO2 26 03/19/2019 1140   GLUCOSE 107 (H) 03/19/2019 1140   BUN 16 03/19/2019 1140   CREATININE 0.67 03/19/2019 1140   CALCIUM 9.1 03/19/2019 1140   PROT 7.3 03/19/2019 1140   ALBUMIN 3.9 03/19/2019 1140   AST 10 (L) 03/19/2019 1140   ALT 7 03/19/2019 1140   ALKPHOS 106 03/19/2019 1140   BILITOT 0.4 03/19/2019 1140   GFRNONAA >60 03/19/2019 1140   GFRAA >60 03/19/2019 1140    No results found for: SPEP, UPEP  Lab Results  Component Value Date   WBC 3.0 (L) 03/19/2019   NEUTROABS 1.9 03/19/2019   HGB 11.0 (L) 03/19/2019   HCT 34.8 (L) 03/19/2019   MCV 89.7 03/19/2019   PLT 182 03/19/2019      Chemistry      Component Value Date/Time   NA 137 03/19/2019 1140   K  3.9 03/19/2019 1140   CL 105 03/19/2019 1140   CO2 26 03/19/2019 1140   BUN 16 03/19/2019 1140   CREATININE 0.67 03/19/2019 1140      Component Value Date/Time   CALCIUM 9.1 03/19/2019 1140   ALKPHOS 106 03/19/2019 1140   AST 10 (L) 03/19/2019 1140   ALT 7 03/19/2019 1140   BILITOT 0.4 03/19/2019 1140       RADIOGRAPHIC STUDIES: I have personally reviewed the radiological images as listed and agreed with the findings in the report. VAS [place] IVC/ILIAC (VENOUS ONLY)  Result Date: 03/12/2019 IVC/ILIAC STUDY Vascular Interventions: Mechanical thrombectomy of left common external iliac                         veins and left common femoral vein and femoral veins;                         Stent of left common external iliac veins with 14 x 59m                         Vici and stent of left external iliac vein and common                         femoral vein with 14 x 90 Wallstent. Limitations:  Air/bowel gas, obesity and patient discomfort.  Performing Technologist: ERonal FearRVS, RCS  Examination Guidelines: A complete evaluation includes B-mode imaging, spectral Doppler, color Doppler, and power Doppler as needed of all accessible portions of each vessel. Bilateral testing is considered an integral part of a complete examination. Limited examinations for reoccurring indications may be performed as noted.  IVC/Iliac Findings: +----------+------+--------+--------------+    IVC    PatentThrombus   Comments    +----------+------+--------+--------------+ IVC Prox  patent                       +----------+------+--------+--------------+ IVC Mid   patent                       +----------+------+--------+--------------+ IVC Distal              not visualized +----------+------+--------+--------------+  +-------------------------+---------+-----------+---------+-----------+--------+            EIV           RT-PatentRT-ThrombusLT-PatentLT-ThrombusComments +-------------------------+---------+-----------+---------+-----------+--------+ External Iliac Vein Mid                       patent                      +-------------------------+---------+-----------+---------+-----------+--------+ External Iliac Vein                           patent                      Distal                                                                    +-------------------------+---------+-----------+---------+-----------+--------+   Summary: IVC/Iliac: Visualization of distal common  Iliac, proximal common Iliac, distal Inferior Vena Cava and proximal external Iliac vein was limited. Portions of the external and common iliac veins that were adequately visualized are patent and free of thrombus.  *See table(s) above for measurements and observations.  Electronically signed by Servando Snare MD on 03/12/2019 at 9:04:23 AM.   Final

## 2019-03-19 NOTE — Assessment & Plan Note (Signed)
Overall, she has positive response to therapy She tolerated pembrolizumab very well without major side effects The plan would be to continue indefinitely and imaging study every 3 to 4 months, next due around March or April

## 2019-03-19 NOTE — Assessment & Plan Note (Addendum)
Her pain is gradually improving She tolerated methadone well We discussed narcotic refill policy

## 2019-03-20 LAB — T4: T4, Total: 5.9 ug/dL (ref 4.5–12.0)

## 2019-04-09 ENCOUNTER — Encounter: Payer: Self-pay | Admitting: Hematology and Oncology

## 2019-04-09 ENCOUNTER — Inpatient Hospital Stay: Payer: Medicare Other

## 2019-04-09 ENCOUNTER — Other Ambulatory Visit: Payer: Self-pay | Admitting: Hematology and Oncology

## 2019-04-09 ENCOUNTER — Other Ambulatory Visit: Payer: Self-pay

## 2019-04-09 ENCOUNTER — Inpatient Hospital Stay (HOSPITAL_BASED_OUTPATIENT_CLINIC_OR_DEPARTMENT_OTHER): Payer: Medicare Other | Admitting: Hematology and Oncology

## 2019-04-09 ENCOUNTER — Inpatient Hospital Stay: Payer: Medicare Other | Attending: Hematology and Oncology

## 2019-04-09 VITALS — BP 125/61 | HR 73 | Temp 98.5°F | Resp 18 | Ht 61.0 in | Wt 195.4 lb

## 2019-04-09 DIAGNOSIS — Z86718 Personal history of other venous thrombosis and embolism: Secondary | ICD-10-CM | POA: Insufficient documentation

## 2019-04-09 DIAGNOSIS — C541 Malignant neoplasm of endometrium: Secondary | ICD-10-CM | POA: Insufficient documentation

## 2019-04-09 DIAGNOSIS — Z7901 Long term (current) use of anticoagulants: Secondary | ICD-10-CM | POA: Insufficient documentation

## 2019-04-09 DIAGNOSIS — C55 Malignant neoplasm of uterus, part unspecified: Secondary | ICD-10-CM

## 2019-04-09 DIAGNOSIS — Z79899 Other long term (current) drug therapy: Secondary | ICD-10-CM | POA: Insufficient documentation

## 2019-04-09 DIAGNOSIS — C774 Secondary and unspecified malignant neoplasm of inguinal and lower limb lymph nodes: Secondary | ICD-10-CM

## 2019-04-09 DIAGNOSIS — I825Z2 Chronic embolism and thrombosis of unspecified deep veins of left distal lower extremity: Secondary | ICD-10-CM | POA: Diagnosis not present

## 2019-04-09 DIAGNOSIS — D61818 Other pancytopenia: Secondary | ICD-10-CM

## 2019-04-09 DIAGNOSIS — C801 Malignant (primary) neoplasm, unspecified: Secondary | ICD-10-CM

## 2019-04-09 DIAGNOSIS — G893 Neoplasm related pain (acute) (chronic): Secondary | ICD-10-CM | POA: Insufficient documentation

## 2019-04-09 DIAGNOSIS — C7951 Secondary malignant neoplasm of bone: Secondary | ICD-10-CM

## 2019-04-09 DIAGNOSIS — Z7189 Other specified counseling: Secondary | ICD-10-CM

## 2019-04-09 DIAGNOSIS — E538 Deficiency of other specified B group vitamins: Secondary | ICD-10-CM | POA: Insufficient documentation

## 2019-04-09 DIAGNOSIS — Z9221 Personal history of antineoplastic chemotherapy: Secondary | ICD-10-CM | POA: Insufficient documentation

## 2019-04-09 DIAGNOSIS — Z5112 Encounter for antineoplastic immunotherapy: Secondary | ICD-10-CM | POA: Insufficient documentation

## 2019-04-09 LAB — CBC WITH DIFFERENTIAL (CANCER CENTER ONLY)
Abs Immature Granulocytes: 0.01 10*3/uL (ref 0.00–0.07)
Basophils Absolute: 0 10*3/uL (ref 0.0–0.1)
Basophils Relative: 1 %
Eosinophils Absolute: 0 10*3/uL (ref 0.0–0.5)
Eosinophils Relative: 1 %
HCT: 34.1 % — ABNORMAL LOW (ref 36.0–46.0)
Hemoglobin: 11 g/dL — ABNORMAL LOW (ref 12.0–15.0)
Immature Granulocytes: 0 %
Lymphocytes Relative: 33 %
Lymphs Abs: 0.9 10*3/uL (ref 0.7–4.0)
MCH: 28.7 pg (ref 26.0–34.0)
MCHC: 32.3 g/dL (ref 30.0–36.0)
MCV: 89 fL (ref 80.0–100.0)
Monocytes Absolute: 0.3 10*3/uL (ref 0.1–1.0)
Monocytes Relative: 13 %
Neutro Abs: 1.3 10*3/uL — ABNORMAL LOW (ref 1.7–7.7)
Neutrophils Relative %: 52 %
Platelet Count: 187 10*3/uL (ref 150–400)
RBC: 3.83 MIL/uL — ABNORMAL LOW (ref 3.87–5.11)
RDW: 15.4 % (ref 11.5–15.5)
WBC Count: 2.6 10*3/uL — ABNORMAL LOW (ref 4.0–10.5)
nRBC: 0 % (ref 0.0–0.2)

## 2019-04-09 LAB — CMP (CANCER CENTER ONLY)
ALT: 8 U/L (ref 0–44)
AST: 10 U/L — ABNORMAL LOW (ref 15–41)
Albumin: 3.7 g/dL (ref 3.5–5.0)
Alkaline Phosphatase: 102 U/L (ref 38–126)
Anion gap: 9 (ref 5–15)
BUN: 13 mg/dL (ref 8–23)
CO2: 24 mmol/L (ref 22–32)
Calcium: 9 mg/dL (ref 8.9–10.3)
Chloride: 106 mmol/L (ref 98–111)
Creatinine: 0.65 mg/dL (ref 0.44–1.00)
GFR, Est AFR Am: >60 mL/min (ref >60)
GFR, Estimated: >60 mL/min (ref >60)
Glucose, Bld: 83 mg/dL (ref 70–99)
Potassium: 3.8 mmol/L (ref 3.5–5.1)
Sodium: 139 mmol/L (ref 135–145)
Total Bilirubin: 0.4 mg/dL (ref 0.3–1.2)
Total Protein: 7.2 g/dL (ref 6.5–8.1)

## 2019-04-09 LAB — TSH: TSH: 2.342 u[IU]/mL (ref 0.308–3.960)

## 2019-04-09 MED ORDER — SODIUM CHLORIDE 0.9% FLUSH
10.0000 mL | INTRAVENOUS | Status: DC | PRN
  Administered 2019-04-09: 10 mL
  Filled 2019-04-09: qty 10

## 2019-04-09 MED ORDER — SODIUM CHLORIDE 0.9% FLUSH
10.0000 mL | Freq: Once | INTRAVENOUS | Status: AC
  Administered 2019-04-09: 10 mL
  Filled 2019-04-09: qty 10

## 2019-04-09 MED ORDER — METHADONE HCL 10 MG PO TABS: 10.0000 mg | ORAL_TABLET | Freq: Two times a day (BID) | ORAL | 0 refills | Status: DC

## 2019-04-09 MED ORDER — SODIUM CHLORIDE 0.9 % IV SOLN
Freq: Once | INTRAVENOUS | Status: AC
  Filled 2019-04-09: qty 250

## 2019-04-09 MED ORDER — SODIUM CHLORIDE 0.9 % IV SOLN
200.0000 mg | Freq: Once | INTRAVENOUS | Status: AC
  Administered 2019-04-09: 14:00:00 200 mg via INTRAVENOUS
  Filled 2019-04-09: qty 8

## 2019-04-09 MED ORDER — HEPARIN SOD (PORK) LOCK FLUSH 100 UNIT/ML IV SOLN
500.0000 [IU] | Freq: Once | INTRAVENOUS | Status: AC | PRN
  Administered 2019-04-09: 500 [IU]
  Filled 2019-04-09: qty 5

## 2019-04-09 MED ORDER — MORPHINE SULFATE 15 MG PO TABS: 15.0000 mg | ORAL_TABLET | Freq: Four times a day (QID) | ORAL | 0 refills | Status: DC | PRN

## 2019-04-09 NOTE — Progress Notes (Signed)
[place] OFFICE PROGRESS NOTE  Patient Care Team: Nolene Ebbs, MD as PCP - General (Internal Medicine)  ASSESSMENT & PLAN:  Uterine cancer ([name]) Overall, she has positive response to therapy She tolerated pembrolizumab very well without major side effects The plan would be to continue indefinitely  I plan to order CT imaging next month before I see her back for objective assessment of response to therapy  Pancytopenia, acquired ([place]) She was found to have iron deficiency anemia and borderline vitamin B12 deficiency After receiving intravenous iron infusion, her blood counts are stable We will continue to monitor closely She is not symptomatic We will proceed without delay  Cancer associated pain Her pain is gradually improving She tolerated methadone well in IR morphine as needed for breakthrough pain We discussed narcotic refill policy  Lower leg DVT (deep venous thromboembolism), chronic, left ([place]) She will continue anticoagulation therapy indefinitely So far, she denies recent bleeding   Orders Placed This Encounter  Procedures  . CT ABDOMEN PELVIS W CONTRAST    Standing Status:   Future    Standing Expiration Date:   04/08/2020    Order Specific Question:   If indicated for the ordered procedure, I authorize the administration of contrast media per Radiology protocol    Answer:   Yes    Order Specific Question:   Preferred imaging location?    Answer:   [facility]    Order Specific Question:   Radiology Contrast Protocol - do NOT remove file path    Answer:   [email or web address]    All questions were answered. The patient knows to call the clinic with any problems, questions or concerns. The total time spent in the appointment was 20 minutes encounter with patients including review of chart and various tests results, discussions about plan of care and coordination of care plan   [name], MD 04/09/2019 1:46  PM  INTERVAL HISTORY: Please see below for problem oriented charting. She returns for further follow-up Her chronic pain is stable She rarely takes much breakthrough pain medicine No recent nausea or constipation Her leg swelling is stable The patient denies any recent signs or symptoms of bleeding such as spontaneous epistaxis, hematuria or hematochezia. 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 Uterus +/- tubes/ovaries, neoplastic, with left fallopian tube and ovary - INVASIVE ENDOMETRIOID CARCINOMA (1.5 CM), FIGO GRADE II, ARISING IN A BACKGROUND OF ATYPICAL COMPLEX HYPERPLASIA, CONFINED WITHIN INNER HALF OF THE MYOMETRIUM. - ENDOMETRIAL POLYP WITH ASSOCIATED ATYPICAL COMPLEX HYPERPLASIA. - MYOMETRIUM: LEIOMYOMATA. - CERVIX: BENIGN SQUAMOUS MUCOSA AND ENDOCERVICAL MUCOSA, NO DYSPLASIA OR MALIGNANCY. - LEFT OVARY: BENIGN OVARIAN TISSUE WITH ENDOSALPINGOSIS, NO EVIDENCE OF ATYPIA OR MALIGNANCY. - LEFT FALLOPIAN TUBE: NO HISTOLOGIC ABNORMALITIES. - PLEASE SEE ONCOLOGY TEMPLATE FOR DETAIL. 2. Ovary and fallopian tube, right - BENIGN OVARIAN TISSUE WITH ENDOSALPINGOSIS, NO ATYPIA OR MALIGNANCY. - BENIGN FALLOPIAN TUBAL TISSUE, NO PATHOLOGIC ABNORMALITIES. Microscopic Comment 1. UTERUS Specimen: Uterus, cervix, bilateral ovaries and fallopian tubes Procedure: Total hysterectomy and bilateral salpingo-oophorectomy Lymph node sampling performed: No Specimen integrity: Intact Maximum tumor size (cm): 1.5 cm, glass slide measurement Histologic type: Invasive endometrioid carcinoma Grade: FIGO grade II Myometrial invasion: 1 cm where myometrium is 2.3 cm in thickness Cervical stromal  involvement: No Extent of  involvement of other organs: No Lymph vascular invasion: Not identified Peritoneal washings: Negative ([id number]) Lymph nodes: number examined N/A; number positive N/A TNM code: pT1a, pNX 1 oFf 3IGO Stage (based on pathologic findings, needs clinical correlation): IA  Comments: Sections the endomyometrium away from the grossly identified endometrial polyp show an invasive FIGO grade II endometrioid carcinoma. The tumor is confined within inner half of the myometrium. No angiolymphatic invasion is identified. No cervical stromal involvement is identified. Sections of the grossly identified endometrial polyp show an endometrial polyp with associated atypical compacted hyperplasia with no definitive evidence of carcinoma.   12/07/2017 Imaging   US venous Doppler Right: No evidence of common femoral vein obstruction. Left: Findings consistent with acute deep vein thrombosis involving the left femoral vein, left proximal profunda vein, and left popliteal vein. Unable to adequately interrogate the common femoral and higher, or the calf secondary to significant edema and body habitus   12/07/2017 [facility] Admission   She presented to the ER and was diagnosed with acute DVT   01/18/2018 - 01/21/2018 Hospital Admission   She was admitted to the hospital for management of severe persistent DVT   01/18/2018 Imaging   US venous Doppler Right: No evidence of common femoral vein obstruction. Left: Findings consistent with acute deep vein thrombosis involving the left common femoral vein, and left popliteal vein.   01/19/2018 Surgery   Pre-operative Diagnosis: Subacute DVT with severe post thrombotic syndrome Post-operative diagnosis:  Same Surgeon:  Erlene Quan C. Donzetta Matters, MD Procedure Performed: 1.  Ultrasound-guided cannulation left small saphenous vein 2.  Left lower extremity and central venography 3.  Intravascular ultrasound of left popliteal, femoral, common femoral, external and common iliac  veins and IVC 4.  Stent of left common and external iliac veins with 14 x 60 mm Vici 5.  Moderate sedation with fentanyl and Versed for 50 minutes  Indications: 74 year old female with a history of DVT in October now presents with persistent left lower extremity swelling and ultrasound demonstrating likely persistent DVT.  She has been on Xarelto at this time.  She is now indicated for venogram possible intervention.  Findings: Flow in the left lower extremity was stagnant throughout but by venogram all veins were patent.  There was a focal occlusive area approximately 2 cm in length at the common and external iliac vein junction at the hypogastric on the left.  After stenting and ballooning we had a diameter of 12 millimeters in the stent and venogram demonstrated flow in the lower extremity veins were previously was stagnant and no further residual stenosis in the left common and external iliac vein junction.   04/12/2018 Imaging   US Venous Doppler Right: No evidence of common femoral vein obstruction. Left: There is no evidence of deep vein thrombosis in the lower extremity. However, portions of this examination were limited- see technologist comments above. Left groin: Large hypoechoic area with mixed echoes noted measuring nearly 10 cm. Possible  hematoma versus unknown etiology. Ultrasound characteristics of enlarged lymph nodes noted in the groin.      05/15/2018 Imaging   US Venous Doppler Right: No evidence of deep vein thrombosis in the lower extremity. No indirect evidence of obstruction proximal to the inguinal ligament. Left: No reflux was noted in the common femoral vein , femoral vein in the thigh, popliteal vein, great saphenous vein at the saphenofemoral junction, great saphenous vein at the proximal thigh, great saphenous vein at the mid thigh, great saphenous  vein  at the distal thigh, great saphenous vein at the knee, origin of the small saphenous vein, proximal small  saphenous vein, and mid small saphenous vein. There is no evidence of deep vein thrombosis in the lower extremity. There is no evidence of superficial venous thrombosis. No cystic structure found in the popliteal fossa. Unable to evaluate extension of common femoral vein obstruction proximal to the inguinal ligament.   06/01/2018 Imaging   1. 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 Additionally, there is lucency involving the medial sidewall of the left acetabulum with potential nondisplaced pathologic fracture. Further evaluation with contrast-enhanced pelvic MRI could be performed as clinically indicated. 2. The left pelvic arterial and venous system is encased by this infiltrative left pelvic sidewall mass however while difficult to ascertain, the left external iliac venous stent appears patent.   06/18/2018 Pathology Results   Lymph node for lymphoma, Left Inguinal - METASTATIC ADENOCARCINOMA, SEE COMMENT. Microscopic Comment Immunohistochemistry is positive for cytokeratin 7, PAX8, ER, and PR. Cytokeratin 5/6,and p63 are negative. The immunoprofile along with the patient's history are consistent with a gynecologic primary.   06/18/2018 Surgery   Pre-op Diagnosis: INGUINAL LYMPHADENOPATHY, PELVIC MASS     Procedure(s): EXCISIONAL BIOPSY DEEP LEFT INGUINAL LYMPH NODE  Surgeon(s): Coralie Keens, MD    06/24/2018 Cancer Staging   Staging form: Corpus Uteri - Carcinoma and Carcinosarcoma, AJCC 8th Edition - Clinical: Stage IVB (cT1a, cN2, pM1) - Signed by [name], MD on 06/24/2018    Genetic Testing   Patient has genetic testing done for MMR on pathology from 06/18/2018. Results revealed patient has the following mutation(s): MMR: abnormal   06/29/2018 Procedure   Placement of a subcutaneous port device. Catheter tip at the SVC and right atrium junction.    Genetic Testing    Patient has genetic testing done for MSI on pathology from 06/18/2018. Results revealed patient has the following mutation(s): MSI: High   07/02/2018 PET scan   Previous hysterectomy, with asymmetric focus of hypermetabolic activity in the left vaginal cuff, suspicious for residual or recurrent carcinoma.  Large hypermetabolic soft tissue mass involving the left pelvic sidewall and acetabulum, consistent with metastatic disease.  No evidence metastatic disease within the abdomen, chest, or neck.   07/09/2018 Tumor Marker   Patient's tumor was tested for the following markers: CA-125 Results of the tumor marker test revealed 9   07/10/2018 - 08/24/2018 Chemotherapy   The patient had carboplatin and taxol x 3 cycles   07/17/2018 Genetic Testing   Negative genetic testing on the common hereditary cancer panel.  The Common Hereditary Gene Panel offered by [facility] includes sequencing and/or deletion duplication testing of the following 48 genes: APC, ATM, AXIN2, BARD1, BMPR1A, BRCA1, BRCA2, BRIP1, CDH1, CDK4, CDKN2A (p14ARF), CDKN2A (p16INK4a), CHEK2, CTNNA1, DICER1, EPCAM (Deletion/duplication testing only), GREM1 (promoter region deletion/duplication testing only), KIT, MEN1, MLH1, MSH2, MSH3, MSH6, MUTYH, NBN, NF1, NHTL1, PALB2, PDGFRA, PMS2, POLD1, POLE, PTEN, RAD50, RAD51C, RAD51D, RNF43, SDHB, SDHC, SDHD, SMAD4, SMARCA4. STK11, TP53, TSC1, TSC2, and VHL.  The following genes were evaluated for sequence changes only: SDHA and HOXB13 c.251G>A variant only. The report date is Jul 17, 2018.    10/03/2018 Imaging   CT abdomen and pelvis 1.  No acute intra-abdominal process. 2. Grossly unchanged left pelvic sidewall mass with osseous involvement of the medial acetabulum. Progressive mild displacement of the associated comminuted pathologic fracture involving the right acetabulum and puboacetabular  junction.  3. New venous stents extending from the left common iliac vein origin to the proximal left  common femoral vein. The stents are patent.   11/06/2018 -  Chemotherapy   The patient had pembrolizumab for chemotherapy treatment.     01/28/2019 Imaging   1. No substantial interval change in exam. 2. Interval development of mild fullness in the left intrarenal collecting system and ureter without overt hydronephrosis at this time. 3. Abnormal soft tissue along the left pelvic sidewall has decreased slightly in the interval. 4. Similar appearance of ill-defined fascial planes in the pelvis with some peritoneal thickening along the right pelvic sidewall and potentially involving the sigmoid mesocolon. 5. No substantial ascites.   Metastasis to lymph nodes ([place])  06/23/2018 Initial Diagnosis   Metastasis to lymph nodes ([place])   07/10/2018 - 09/14/2018 Chemotherapy   The patient had palonosetron (ALOXI) injection 0.25 mg, 0.25 mg, Intravenous,  Once, 3 of 6 cycles Administration: 0.25 mg (07/10/2018), 0.25 mg (07/31/2018), 0.25 mg (08/24/2018) CARBOplatin (PARAPLATIN) 480 mg in sodium chloride 0.9 % 250 mL chemo infusion, 480 mg (100 % of original dose 482.5 mg), Intravenous,  Once, 3 of 6 cycles Dose modification: 482.5 mg (original dose 482.5 mg, Cycle 1) Administration: 480 mg (07/10/2018), 480 mg (07/31/2018), 480 mg (08/24/2018) PACLitaxel (TAXOL) 276 mg in sodium chloride 0.9 % 250 mL chemo infusion (> 50m/m2), 140 mg/m2 = 276 mg (80 % of original dose 175 mg/m2), Intravenous,  Once, 3 of 6 cycles Dose modification: 140 mg/m2 (80 % of original dose 175 mg/m2, Cycle 1, Reason: Dose Not Tolerated) Administration: 276 mg (07/10/2018), 276 mg (07/31/2018), 276 mg (08/24/2018) fosaprepitant (EMEND) 150 mg, dexamethasone (DECADRON) 12 mg in sodium chloride 0.9 % 145 mL IVPB, , Intravenous,  Once, 3 of 6 cycles Administration:  (07/10/2018),  (07/31/2018),  (08/24/2018)  for chemotherapy treatment.    11/06/2018 -  Chemotherapy   The patient had pembrolizumab for chemotherapy treatment.     Metastasis to  bone ([place]  06/24/2018 Initial Diagnosis   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   07/10/2018 - 09/14/2018 Chemotherapy   The patient had palonosetron (ALOXI) injection 0.25 mg, 0.25 mg, Intravenous,  Once, 3 of 6 cycles Administration: 0.25 mg (07/10/2018), 0.25 mg (07/31/2018), 0.25 mg (08/24/2018) CARBOplatin (PARAPLATIN) 480 mg in sodium chloride 0.9 % 250 mL chemo infusion, 480 mg (100 % of original dose 482.5 mg), Intravenous,  Once, 3 of 6 cycles Dose modification: 482.5 mg (original dose 482.5 mg, Cycle 1) Administration: 480 mg (07/10/2018), 480 mg (07/31/2018), 480 mg (08/24/2018) PACLitaxel (TAXOL) 276 mg in sodium chloride 0.9 % 250 mL chemo infusion (> 839mm2), 140 mg/m2 = 276 mg (80 % of original dose 175 mg/m2), Intravenous,  Once, 3 of 6 cycles Dose modification: 140 mg/m2 (80 % of original dose 175 mg/m2, Cycle 1, Reason: Dose Not Tolerated) Administration: 276 mg (07/10/2018), 276 mg (07/31/2018), 276 mg (08/24/2018) fosaprepitant (EMEND) 150 mg, dexamethasone (DECADRON) 12 mg in sodium chloride 0.9 % 145 mL IVPB, , Intravenous,  Once, 3 of 6 cycles Administration:  (07/10/2018),  (07/31/2018),  (08/24/2018)  for chemotherapy treatment.    11/06/2018 -  Chemotherapy   The patient had pembrolizumab for chemotherapy treatment.     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       REVIEW OF  SYSTEMS:   Constitutional: Denies fevers, chills or abnormal weight loss Eyes: Denies blurriness of vision Ears, nose, mouth, throat, and face: Denies mucositis or sore throat Respiratory: Denies cough, dyspnea or wheezes Cardiovascular: Denies palpitation, chest discomfort  Gastrointestinal:  Denies nausea, heartburn or change in bowel habits Skin: Denies abnormal  skin rashes Lymphatics: Denies new lymphadenopathy or easy bruising Neurological:Denies numbness, tingling or new weaknesses Behavioral/Psych: Mood is stable, no new changes  All other systems were reviewed with the patient and are negative.  I have reviewed the past medical history, past surgical history, social history and family history with the patient and they are unchanged from previous note.  ALLERGIES:  has No Known Allergies.  MEDICATIONS:  Current Outpatient Medications  Medication Sig Dispense Refill  . clopidogrel (PLAVIX) 75 MG tablet Take 1 tablet (75 mg total) by mouth daily with breakfast. 30 tablet 1  . diclofenac sodium (VOLTAREN) 1 % GEL APPLY 4GRAMS 4 TIMES A DAY AS NEEDED FOR PAINS    . Eliquis DVT/PE Starter Pack (ELIQUIS STARTER PACK) 5 MG TABS Take as directed on package: start with two-36m tablets twice daily for 7 days. On day 8, switch to one-529mtablet twice daily. 1 each 0  . methadone (DOLOPHINE) 10 MG tablet Take 1 tablet (10 mg total) by mouth every 12 (twelve) hours. 60 tablet 0  . morphine (MSIR) 15 MG tablet Take 1 tablet (15 mg total) by mouth every 6 (six) hours as needed for severe pain. 60 tablet 0  . Olopatadine HCl 0.2 % SOLN Place 1 drop into both eyes daily.     No current facility-administered medications for this visit.   Facility-Administered Medications Ordered in Other Visits  Medication Dose Route Frequency Provider Last Rate Last Admin  . heparin lock flush 100 unit/mL  500 Units Intracatheter Once PRN GoAlvy BimlerNi, MD      . pembrolizumab (KEYTRUDA) 200 mg in sodium chloride 0.9 % 50 mL chemo infusion  200 mg Intravenous Once Attilio Zeitler, MD      . sodium chloride flush (NS) 0.9 % injection 10 mL  10 mL Intracatheter PRN GoAlvy BimlerNi, MD        PHYSICAL EXAMINATION: ECOG PERFORMANCE STATUS: 1 - Symptomatic but completely ambulatory  Vitals:   04/09/19 1233  BP: 125/61  Pulse: 73  Resp: 18  Temp: 98.5 F (36.9 C)  SpO2: 100%    Filed Weights   04/09/19 1233  Weight: 195 lb 6.4 oz (88.6 kg)    GENERAL:alert, no distress and comfortable Musculoskeletal:no cyanosis of digits and no clubbing  NEURO: alert & oriented x 3 with fluent speech, no focal motor/sensory deficits  LABORATORY DATA:  I have reviewed the data as listed    Component Value Date/Time   NA 139 04/09/2019 1210   K 3.8 04/09/2019 1210   CL 106 04/09/2019 1210   CO2 24 04/09/2019 1210   GLUCOSE 83 04/09/2019 1210   BUN 13 04/09/2019 1210   CREATININE 0.65 04/09/2019 1210   CALCIUM 9.0 04/09/2019 1210   PROT 7.2 04/09/2019 1210   ALBUMIN 3.7 04/09/2019 1210   AST 10 (L) 04/09/2019 1210   ALT 8 04/09/2019 1210   ALKPHOS 102 04/09/2019 1210   BILITOT 0.4 04/09/2019 1210   GFRNONAA >60 04/09/2019 1210   GFRAA >60 04/09/2019 1210    No results found for: SPEP, UPEP  Lab Results  Component Value Date   WBC 2.6 (L) 04/09/2019   NEUTROABS 1.3 (L) 04/09/2019  HGB 11.0 (L) 04/09/2019   HCT 34.1 (L) 04/09/2019   MCV 89.0 04/09/2019   PLT 187 04/09/2019      Chemistry      Component Value Date/Time   NA 139 04/09/2019 1210   K 3.8 04/09/2019 1210   CL 106 04/09/2019 1210   CO2 24 04/09/2019 1210   BUN 13 04/09/2019 1210   CREATININE 0.65 04/09/2019 1210      Component Value Date/Time   CALCIUM 9.0 04/09/2019 1210   ALKPHOS 102 04/09/2019 1210   AST 10 (L) 04/09/2019 1210   ALT 8 04/09/2019 1210   BILITOT 0.4 04/09/2019 1210       RADIOGRAPHIC STUDIES: I have personally reviewed the radiological images as listed and agreed with the findings in the report. VAS [place] IVC/ILIAC (VENOUS ONLY)  Result Date: 03/12/2019 IVC/ILIAC STUDY Vascular Interventions: Mechanical thrombectomy of left common external iliac                         veins and left common femoral vein and femoral veins;                         Stent of left common external iliac veins with 14 x 41m                         Vici and stent of left external iliac  vein and common                         femoral vein with 14 x 90 Wallstent. Limitations: Air/bowel gas, obesity and patient discomfort.  Performing Technologist: ERonal FearRVS, RCS  Examination Guidelines: A complete evaluation includes B-mode imaging, spectral Doppler, color Doppler, and power Doppler as needed of all accessible portions of each vessel. Bilateral testing is considered an integral part of a complete examination. Limited examinations for reoccurring indications may be performed as noted.  IVC/Iliac Findings: +----------+------+--------+--------------+    IVC    PatentThrombus   Comments    +----------+------+--------+--------------+ IVC Prox  patent                       +----------+------+--------+--------------+ IVC Mid   patent                       +----------+------+--------+--------------+ IVC Distal              not visualized +----------+------+--------+--------------+  +-------------------------+---------+-----------+---------+-----------+--------+            EIV           RT-PatentRT-ThrombusLT-PatentLT-ThrombusComments +-------------------------+---------+-----------+---------+-----------+--------+ External Iliac Vein Mid                       patent                      +-------------------------+---------+-----------+---------+-----------+--------+ External Iliac Vein                           patent                      Distal                                                                    +-------------------------+---------+-----------+---------+-----------+--------+  Summary: IVC/Iliac: Visualization of distal common Iliac, proximal common Iliac, distal Inferior Vena Cava and proximal external Iliac vein was limited. Portions of the external and common iliac veins that were adequately visualized are patent and free of thrombus.  *See table(s) above for measurements and observations.  Electronically signed by Servando Snare MD  on 03/12/2019 at 9:04:23 AM.   Final

## 2019-04-09 NOTE — Assessment & Plan Note (Signed)
Overall, she has positive response to therapy She tolerated pembrolizumab very well without major side effects The plan would be to continue indefinitely  I plan to order CT imaging next month before I see her back for objective assessment of response to therapy

## 2019-04-09 NOTE — Assessment & Plan Note (Signed)
She will continue anticoagulation therapy indefinitely So far, she denies recent bleeding

## 2019-04-09 NOTE — Assessment & Plan Note (Signed)
She was found to have iron deficiency anemia and borderline vitamin B12 deficiency After receiving intravenous iron infusion, her blood counts are stable We will continue to monitor closely She is not symptomatic We will proceed without delay 

## 2019-04-09 NOTE — Patient Instructions (Signed)
Pembrolizumab injection What is this medicine? PEMBROLIZUMAB (pem broe liz ue mab) is a monoclonal antibody. It is used to treat certain types of cancer. This medicine may be used for other purposes; ask your health care provider or pharmacist if you have questions. COMMON BRAND NAME(S): Keytruda What should I tell my health care provider before I take this medicine? They need to know if you have any of these conditions:  diabetes  immune system problems  inflammatory bowel disease  liver disease  lung or breathing disease  lupus  received or scheduled to receive an organ transplant or a stem-cell transplant that uses donor stem cells  an unusual or allergic reaction to pembrolizumab, other medicines, foods, dyes, or preservatives  pregnant or trying to get pregnant  breast-feeding How should I use this medicine? This medicine is for infusion into a vein. It is given by a health care professional in a hospital or clinic setting. A special MedGuide will be given to you before each treatment. Be sure to read this information carefully each time. Talk to your pediatrician regarding the use of this medicine in children. While this drug may be prescribed for children as young as 6 months for selected conditions, precautions do apply. Overdosage: If you think you have taken too much of this medicine contact a poison control center or emergency room at once. NOTE: This medicine is only for you. Do not share this medicine with others. What if I miss a dose? It is important not to miss your dose. Call your doctor or health care professional if you are unable to keep an appointment. What may interact with this medicine? Interactions have not been studied. Give your health care provider a list of all the medicines, herbs, non-prescription drugs, or dietary supplements you use. Also tell them if you smoke, drink alcohol, or use illegal drugs. Some items may interact with your medicine. This  list may not describe all possible interactions. Give your health care provider a list of all the medicines, herbs, non-prescription drugs, or dietary supplements you use. Also tell them if you smoke, drink alcohol, or use illegal drugs. Some items may interact with your medicine. What should I watch for while using this medicine? Your condition will be monitored carefully while you are receiving this medicine. You may need blood work done while you are taking this medicine. Do not become pregnant while taking this medicine or for 4 months after stopping it. Women should inform their doctor if they wish to become pregnant or think they might be pregnant. There is a potential for serious side effects to an unborn child. Talk to your health care professional or pharmacist for more information. Do not breast-feed an infant while taking this medicine or for 4 months after the last dose. What side effects may I notice from receiving this medicine? Side effects that you should report to your doctor or health care professional as soon as possible:  allergic reactions like skin rash, itching or hives, swelling of the face, lips, or tongue  bloody or black, tarry  breathing problems  changes in vision  chest pain  chills  confusion  constipation  cough  diarrhea  dizziness or feeling faint or lightheaded  fast or irregular heartbeat  fever  flushing  joint pain  low blood counts - this medicine may decrease the number of white blood cells, red blood cells and platelets. You may be at increased risk for infections and bleeding.  muscle pain  muscle   weakness  pain, tingling, numbness in the hands or feet  persistent headache  redness, blistering, peeling or loosening of the skin, including inside the mouth  signs and symptoms of high blood sugar such as dizziness; dry mouth; dry skin; fruity breath; nausea; stomach pain; increased hunger or thirst; increased urination  signs  and symptoms of kidney injury like trouble passing urine or change in the amount of urine  signs and symptoms of liver injury like dark urine, light-colored stools, loss of appetite, nausea, right upper belly pain, yellowing of the eyes or skin  sweating  swollen lymph nodes  weight loss Side effects that usually do not require medical attention (report to your doctor or health care professional if they continue or are bothersome):  decreased appetite  hair loss  muscle pain  tiredness This list may not describe all possible side effects. Call your doctor for medical advice about side effects. You may report side effects to FDA at 1-800-FDA-1088. Where should I keep my medicine? This drug is given in a hospital or clinic and will not be stored at home. NOTE: This sheet is a summary. It may not cover all possible information. If you have questions about this medicine, talk to your doctor, pharmacist, or health care provider.  2020 Elsevier/Gold Standard (2018-12-11 18:07:58)  Coronavirus (COVID-19) Are you at risk?  Are you at risk for the Coronavirus (COVID-19)?  To be considered HIGH RISK for Coronavirus (COVID-19), you have to meet the following criteria:  . Traveled to [place], [place], [place], [place] or [place]; or in the [place] to [place], [place], [place], or [place]; and have fever, cough, and shortness of breath within the last 2 weeks of travel OR . Been in close contact with a person diagnosed with COVID-19 within the last 2 weeks and have fever, cough, and shortness of breath . IF YOU DO NOT MEET THESE CRITERIA, YOU ARE CONSIDERED LOW RISK FOR COVID-19.  What to do if you are HIGH RISK for COVID-19?  . If you are having a medical emergency, call 911. . Seek medical care right away. Before you go to a doctor's office, urgent care or emergency department, call ahead and tell them about your recent travel, contact with someone diagnosed with COVID-19, and  your symptoms. You should receive instructions from your physician's office regarding next steps of care.  . When you arrive at healthcare provider, tell the healthcare staff immediately you have returned from visiting [place], [place], [place], [place] or [place]; or traveled in the [place] to [place], [place], [place], or [place]; in the last two weeks or you have been in close contact with a person diagnosed with COVID-19 in the last 2 weeks.   . Tell the health care staff about your symptoms: fever, cough and shortness of breath. . After you have been seen by a medical provider, you will be either: o Tested for (COVID-19) and discharged home on quarantine except to seek medical care if symptoms worsen, and asked to  - Stay home and avoid contact with others until you get your results (4-5 days)  - Avoid travel on public transportation if possible (such as bus, train, or airplane) or o Sent to the Emergency Department by EMS for evaluation, COVID-19 testing, and possible admission depending on your condition and test results.  What to do if you are LOW RISK for COVID-19?  Reduce your risk of any infection by using the same precautions used   for avoiding the common cold or flu:  . Wash your hands often with soap and warm water for at least 20 seconds.  If soap and water are not readily available, use an alcohol-based hand sanitizer with at least 60% alcohol.  . If coughing or sneezing, cover your mouth and nose by coughing or sneezing into the elbow areas of your shirt or coat, into a tissue or into your sleeve (not your hands). . Avoid shaking hands with others and consider head nods or verbal greetings only. . Avoid touching your eyes, nose, or mouth with unwashed hands.  . Avoid close contact with people who are sick. . Avoid places or events with large numbers of people in one location, like concerts or sporting events. . Carefully consider travel plans you have or are  making. . If you are planning any travel outside or inside the [place], visit the CDC's Travelers' Health webpage for the latest health notices. . If you have some symptoms but not all symptoms, continue to monitor at home and seek medical attention if your symptoms worsen. . If you are having a medical emergency, call 911.   ADDITIONAL HEALTHCARE OPTIONS FOR PATIENTS   Telehealth / e-Visit: [email or web address]         [facility]: [phone]  [facility]: [phone]                   [facility]: [phone]   

## 2019-04-09 NOTE — Assessment & Plan Note (Signed)
Her pain is gradually improving She tolerated methadone well in IR morphine as needed for breakthrough pain We discussed narcotic refill policy

## 2019-04-10 LAB — T4: T4, Total: 6.2 ug/dL (ref 4.5–12.0)

## 2019-04-12 ENCOUNTER — Telehealth: Payer: Self-pay | Admitting: Hematology and Oncology

## 2019-04-12 NOTE — Telephone Encounter (Signed)
Scheduled appt per 2/19 sch message - unable to reach pt . Unable to leave message - mailed appt with d/t

## 2019-04-20 ENCOUNTER — Telehealth: Payer: Self-pay | Admitting: Hematology and Oncology

## 2019-04-20 ENCOUNTER — Telehealth: Payer: Self-pay

## 2019-04-20 NOTE — Telephone Encounter (Signed)
She called regarding upcoming appts on 3/11scan and 3/12. Her son will be traveling out of town 3/10 thru 3/14. He is wanting her to go with him. She has had both of the COVID vaccines and is not sure if she should go. If she does not go, she will not have anyone to help her. What does Dr. Alvy Bimler think about traveling? Offered to move appts but she wants to get Dr. Alvy Bimler opinion first.

## 2019-04-20 NOTE — Telephone Encounter (Signed)
Called and given below message. She verbalized understanding. Scan appt moved to 3/15 with lab and flush prior to, given times. She verbalized understanding. Scheduling message sent for appt for Dr. Alvy Bimler and treatment for the next day.

## 2019-04-20 NOTE — Telephone Encounter (Signed)
I suggest she go with her son Make sure she brings her pain medication and nausea medication along We should move her CT and appt to the following week after she returns

## 2019-04-20 NOTE — Telephone Encounter (Signed)
Scheduled appts per 3/2 sch msg. Pt is aware of new appt date and time.

## 2019-04-27 ENCOUNTER — Telehealth: Payer: Self-pay

## 2019-04-27 NOTE — Telephone Encounter (Signed)
She called and left a message to call her.   Called back. She is verifying upcoming appts. Given appt date and times. She verbalized understanding.

## 2019-04-29 ENCOUNTER — Ambulatory Visit (HOSPITAL_COMMUNITY): Payer: Medicare Other

## 2019-04-30 ENCOUNTER — Other Ambulatory Visit: Payer: Medicare Other

## 2019-04-30 ENCOUNTER — Ambulatory Visit: Payer: Medicare Other

## 2019-04-30 ENCOUNTER — Ambulatory Visit: Payer: Medicare Other | Admitting: Hematology and Oncology

## 2019-05-03 ENCOUNTER — Inpatient Hospital Stay: Payer: Medicare Other | Attending: Hematology and Oncology

## 2019-05-03 ENCOUNTER — Other Ambulatory Visit: Payer: Self-pay

## 2019-05-03 ENCOUNTER — Ambulatory Visit (HOSPITAL_COMMUNITY)
Admission: RE | Admit: 2019-05-03 | Discharge: 2019-05-03 | Disposition: A | Discharge status: Home / Self Care | Payer: Medicare Other | Source: Ambulatory Visit | Attending: Hematology and Oncology | Admitting: Hematology and Oncology

## 2019-05-03 ENCOUNTER — Inpatient Hospital Stay: Payer: Medicare Other

## 2019-05-03 DIAGNOSIS — Z9221 Personal history of antineoplastic chemotherapy: Secondary | ICD-10-CM | POA: Insufficient documentation

## 2019-05-03 DIAGNOSIS — I700 Atherosclerosis of aorta: Secondary | ICD-10-CM | POA: Diagnosis not present

## 2019-05-03 DIAGNOSIS — C774 Secondary and unspecified malignant neoplasm of inguinal and lower limb lymph nodes: Secondary | ICD-10-CM

## 2019-05-03 DIAGNOSIS — G893 Neoplasm related pain (acute) (chronic): Secondary | ICD-10-CM | POA: Diagnosis not present

## 2019-05-03 DIAGNOSIS — Z86718 Personal history of other venous thrombosis and embolism: Secondary | ICD-10-CM | POA: Insufficient documentation

## 2019-05-03 DIAGNOSIS — C7951 Secondary malignant neoplasm of bone: Secondary | ICD-10-CM | POA: Insufficient documentation

## 2019-05-03 DIAGNOSIS — C541 Malignant neoplasm of endometrium: Secondary | ICD-10-CM | POA: Diagnosis present

## 2019-05-03 DIAGNOSIS — K5730 Diverticulosis of large intestine without perforation or abscess without bleeding: Secondary | ICD-10-CM | POA: Diagnosis not present

## 2019-05-03 DIAGNOSIS — C55 Malignant neoplasm of uterus, part unspecified: Secondary | ICD-10-CM

## 2019-05-03 DIAGNOSIS — Z79899 Other long term (current) drug therapy: Secondary | ICD-10-CM | POA: Insufficient documentation

## 2019-05-03 DIAGNOSIS — Z7189 Other specified counseling: Secondary | ICD-10-CM

## 2019-05-03 DIAGNOSIS — C801 Malignant (primary) neoplasm, unspecified: Secondary | ICD-10-CM

## 2019-05-03 LAB — CBC WITH DIFFERENTIAL (CANCER CENTER ONLY)
Abs Immature Granulocytes: 0 10*3/uL (ref 0.00–0.07)
Basophils Absolute: 0 10*3/uL (ref 0.0–0.1)
Basophils Relative: 0 %
Eosinophils Absolute: 0 10*3/uL (ref 0.0–0.5)
Eosinophils Relative: 1 %
HCT: 37.1 % (ref 36.0–46.0)
Hemoglobin: 12.2 g/dL (ref 12.0–15.0)
Immature Granulocytes: 0 %
Lymphocytes Relative: 20 %
Lymphs Abs: 0.6 10*3/uL — ABNORMAL LOW (ref 0.7–4.0)
MCH: 29.8 pg (ref 26.0–34.0)
MCHC: 32.9 g/dL (ref 30.0–36.0)
MCV: 90.7 fL (ref 80.0–100.0)
Monocytes Absolute: 0.4 10*3/uL (ref 0.1–1.0)
Monocytes Relative: 13 %
Neutro Abs: 1.9 10*3/uL (ref 1.7–7.7)
Neutrophils Relative %: 66 %
Platelet Count: 177 10*3/uL (ref 150–400)
RBC: 4.09 MIL/uL (ref 3.87–5.11)
RDW: 14.8 % (ref 11.5–15.5)
WBC Count: 2.9 10*3/uL — ABNORMAL LOW (ref 4.0–10.5)
nRBC: 0 % (ref 0.0–0.2)

## 2019-05-03 LAB — CMP (CANCER CENTER ONLY)
ALT: 7 U/L (ref 0–44)
AST: 13 U/L — ABNORMAL LOW (ref 15–41)
Albumin: 3.8 g/dL (ref 3.5–5.0)
Alkaline Phosphatase: 97 U/L (ref 38–126)
Anion gap: 11 (ref 5–15)
BUN: 12 mg/dL (ref 8–23)
CO2: 25 mmol/L (ref 22–32)
Calcium: 9.2 mg/dL (ref 8.9–10.3)
Chloride: 105 mmol/L (ref 98–111)
Creatinine: 0.7 mg/dL (ref 0.44–1.00)
GFR, Est AFR Am: >60 mL/min (ref >60)
GFR, Estimated: >60 mL/min (ref >60)
Glucose, Bld: 94 mg/dL (ref 70–99)
Potassium: 3.7 mmol/L (ref 3.5–5.1)
Sodium: 141 mmol/L (ref 135–145)
Total Bilirubin: 0.5 mg/dL (ref 0.3–1.2)
Total Protein: 7.1 g/dL (ref 6.5–8.1)

## 2019-05-03 LAB — TSH: TSH: 2.323 u[IU]/mL (ref 0.308–3.960)

## 2019-05-03 MED ORDER — HEPARIN SOD (PORK) LOCK FLUSH 100 UNIT/ML IV SOLN
500.0000 [IU] | Freq: Once | INTRAVENOUS | Status: AC
  Administered 2019-05-03: 500 [IU] via INTRAVENOUS

## 2019-05-03 MED ORDER — HEPARIN SOD (PORK) LOCK FLUSH 100 UNIT/ML IV SOLN
INTRAVENOUS | Status: AC
  Filled 2019-05-03: qty 5

## 2019-05-03 MED ORDER — SODIUM CHLORIDE (PF) 0.9 % IJ SOLN
INTRAMUSCULAR | Status: AC
  Filled 2019-05-03: qty 50

## 2019-05-03 MED ORDER — SODIUM CHLORIDE 0.9% FLUSH
10.0000 mL | Freq: Once | INTRAVENOUS | Status: AC
  Administered 2019-05-03: 10 mL
  Filled 2019-05-03: qty 10

## 2019-05-03 MED ORDER — IOHEXOL 300 MG/ML  SOLN
100.0000 mL | Freq: Once | INTRAMUSCULAR | Status: AC | PRN
  Administered 2019-05-03: 100 mL via INTRAVENOUS

## 2019-05-04 ENCOUNTER — Inpatient Hospital Stay (HOSPITAL_BASED_OUTPATIENT_CLINIC_OR_DEPARTMENT_OTHER): Payer: Medicare Other | Admitting: Hematology and Oncology

## 2019-05-04 ENCOUNTER — Inpatient Hospital Stay: Payer: Medicare Other

## 2019-05-04 ENCOUNTER — Other Ambulatory Visit: Payer: Self-pay

## 2019-05-04 ENCOUNTER — Encounter: Payer: Self-pay | Admitting: Hematology and Oncology

## 2019-05-04 DIAGNOSIS — G893 Neoplasm related pain (acute) (chronic): Secondary | ICD-10-CM

## 2019-05-04 DIAGNOSIS — C55 Malignant neoplasm of uterus, part unspecified: Secondary | ICD-10-CM

## 2019-05-04 DIAGNOSIS — C7951 Secondary malignant neoplasm of bone: Secondary | ICD-10-CM

## 2019-05-04 DIAGNOSIS — C774 Secondary and unspecified malignant neoplasm of inguinal and lower limb lymph nodes: Secondary | ICD-10-CM

## 2019-05-04 DIAGNOSIS — C541 Malignant neoplasm of endometrium: Secondary | ICD-10-CM | POA: Diagnosis not present

## 2019-05-04 DIAGNOSIS — C801 Malignant (primary) neoplasm, unspecified: Secondary | ICD-10-CM

## 2019-05-04 DIAGNOSIS — I825Z2 Chronic embolism and thrombosis of unspecified deep veins of left distal lower extremity: Secondary | ICD-10-CM

## 2019-05-04 DIAGNOSIS — Z7189 Other specified counseling: Secondary | ICD-10-CM

## 2019-05-04 LAB — T4: T4, Total: 6.8 ug/dL (ref 4.5–12.0)

## 2019-05-04 MED ORDER — SODIUM CHLORIDE 0.9% FLUSH
10.0000 mL | INTRAVENOUS | Status: DC | PRN
  Administered 2019-05-04: 10 mL
  Filled 2019-05-04: qty 10

## 2019-05-04 MED ORDER — HEPARIN SOD (PORK) LOCK FLUSH 100 UNIT/ML IV SOLN
500.0000 [IU] | Freq: Once | INTRAVENOUS | Status: AC | PRN
  Administered 2019-05-04: 500 [IU]
  Filled 2019-05-04: qty 5

## 2019-05-04 MED ORDER — SODIUM CHLORIDE 0.9 % IV SOLN
200.0000 mg | Freq: Once | INTRAVENOUS | Status: AC
  Administered 2019-05-04: 200 mg via INTRAVENOUS
  Filled 2019-05-04: qty 8

## 2019-05-04 MED ORDER — SODIUM CHLORIDE 0.9 % IV SOLN
Freq: Once | INTRAVENOUS | Status: AC
  Filled 2019-05-04: qty 250

## 2019-05-04 NOTE — Assessment & Plan Note (Signed)
I have reviewed CT imaging studies with her She has excellent response to treatment There has been no growth in the lymph node in the pelvis She has nearly no side effects from treatment so far We will continue treatment indefinitely every 3 weeks I do not plan to repeat her CT imaging again for a few more months, probably next due in September

## 2019-05-04 NOTE — Patient Instructions (Signed)
[facility] Discharge Instructions for Patients Receiving Chemotherapy  Today you received the following chemotherapy agents :  pembrolizumab (Keytruda)  To help prevent nausea and vomiting after your treatment, we encourage you to take your nausea medication as prescribed.   If you develop nausea and vomiting that is not controlled by your nausea medication, call the clinic.   BELOW ARE SYMPTOMS THAT SHOULD BE REPORTED IMMEDIATELY:  *FEVER GREATER THAN 100.5 F  *CHILLS WITH OR WITHOUT FEVER  NAUSEA AND VOMITING THAT IS NOT CONTROLLED WITH YOUR NAUSEA MEDICATION  *UNUSUAL SHORTNESS OF BREATH  *UNUSUAL BRUISING OR BLEEDING  TENDERNESS IN MOUTH AND THROAT WITH OR WITHOUT PRESENCE OF ULCERS  *URINARY PROBLEMS  *BOWEL PROBLEMS  UNUSUAL RASH Items with * indicate a potential emergency and should be followed up as soon as possible.  Feel free to call the clinic should you have any questions or concerns. The clinic phone number is [phone].  Please show the CHEMO ALERT CARD at check-in to the Emergency Department and triage nurse.   

## 2019-05-04 NOTE — Assessment & Plan Note (Signed)
She has excellent pain control She will continue current prescribed morphine sulfate and methadone as needed We discussed narcotic refill policy 

## 2019-05-04 NOTE — Assessment & Plan Note (Signed)
She will continue anticoagulation therapy indefinitely So far, she denies recent bleeding

## 2019-05-04 NOTE — Progress Notes (Signed)
[place] OFFICE PROGRESS NOTE  Patient Care Team: Nolene Ebbs, MD as PCP - General (Internal Medicine)  ASSESSMENT & PLAN:  Uterine cancer [facility]) I have reviewed CT imaging studies with her She has excellent response to treatment There has been no growth in the lymph node in the pelvis She has nearly no side effects from treatment so far We will continue treatment indefinitely every 3 weeks I do not plan to repeat her CT imaging again for a few more months, probably next due in September  Cancer associated pain She has excellent pain control She will continue current prescribed morphine sulfate and methadone as needed We discussed narcotic refill policy  Lower leg DVT (deep venous thromboembolism), chronic, left ([place]) She will continue anticoagulation therapy indefinitely So far, she denies recent bleeding   No orders of the defined types were placed in this encounter.   All questions were answered. The patient knows to call the clinic with any problems, questions or concerns. The total time spent in the appointment was 20 minutes encounter with patients including review of chart and various tests results, discussions about plan of care and coordination of care plan   [name], MD 05/04/2019 4:10 PM  INTERVAL HISTORY: Please see below for problem oriented charting. 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 Uterus +/- tubes/ovaries, neoplastic, with left fallopian tube and ovary - INVASIVE ENDOMETRIOID CARCINOMA (1.5 CM), FIGO GRADE II, ARISING IN A BACKGROUND OF ATYPICAL COMPLEX HYPERPLASIA, CONFINED  WITHIN INNER HALF OF THE MYOMETRIUM. - ENDOMETRIAL POLYP WITH ASSOCIATED ATYPICAL COMPLEX HYPERPLASIA. - MYOMETRIUM: LEIOMYOMATA. - CERVIX: BENIGN SQUAMOUS MUCOSA AND ENDOCERVICAL MUCOSA, NO DYSPLASIA OR MALIGNANCY. - LEFT OVARY: BENIGN OVARIAN TISSUE WITH ENDOSALPINGOSIS, NO EVIDENCE OF ATYPIA OR MALIGNANCY. - LEFT FALLOPIAN TUBE: NO HISTOLOGIC ABNORMALITIES. - PLEASE SEE ONCOLOGY TEMPLATE FOR DETAIL. 2. Ovary and fallopian tube, right - BENIGN OVARIAN TISSUE WITH ENDOSALPINGOSIS, NO ATYPIA OR MALIGNANCY. - BENIGN FALLOPIAN TUBAL TISSUE, NO PATHOLOGIC ABNORMALITIES. Microscopic Comment 1. UTERUS Specimen: Uterus, cervix, bilateral ovaries and fallopian tubes Procedure: Total hysterectomy and bilateral salpingo-oophorectomy Lymph node sampling performed: No Specimen integrity: Intact Maximum tumor size (cm): 1.5 cm, glass slide measurement Histologic type: Invasive endometrioid carcinoma Grade: FIGO grade II Myometrial invasion: 1 cm where myometrium is 2.3 cm in thickness Cervical stromal involvement: No Extent of involvement of other organs: No Lymph vascular invasion: Not identified Peritoneal washings: Negative ([id number]) Lymph nodes: number examined N/A; number positive N/A TNM code: pT1a, pNX 1 oFf 3IGO Stage (based on pathologic findings, needs clinical correlation): IA  Comments: Sections the endomyometrium away from the grossly identified endometrial polyp show an invasive FIGO grade II endometrioid carcinoma. The tumor is confined within inner half of the myometrium. No angiolymphatic invasion is identified. No cervical stromal involvement is identified. Sections of the grossly identified endometrial polyp show an endometrial polyp with associated atypical compacted hyperplasia with no definitive evidence of carcinoma.   12/07/2017 Imaging   US venous Doppler Right: No evidence of common femoral vein obstruction. Left: Findings consistent with acute deep vein thrombosis  involving the left femoral vein, left proximal profunda vein, and left popliteal vein. Unable to adequately  interrogate the common femoral and higher, or the calf secondary to significant edema and body habitus   12/07/2017 [facility] Admission   She presented to the ER and was diagnosed with acute DVT   01/18/2018 - 01/21/2018 Hospital Admission   She was admitted to the hospital for management of severe persistent DVT   01/18/2018 Imaging   US venous Doppler Right: No evidence of common femoral vein obstruction. Left: Findings consistent with acute deep vein thrombosis involving the left common femoral vein, and left popliteal vein.   01/19/2018 Surgery   Pre-operative Diagnosis: Subacute DVT with severe post thrombotic syndrome Post-operative diagnosis:  Same Surgeon:  Erlene Quan C. Donzetta Matters, MD Procedure Performed: 1.  Ultrasound-guided cannulation left small saphenous vein 2.  Left lower extremity and central venography 3.  Intravascular ultrasound of left popliteal, femoral, common femoral, external and common iliac veins and IVC 4.  Stent of left common and external iliac veins with 14 x 60 mm Vici 5.  Moderate sedation with fentanyl and Versed for 50 minutes  Indications: 74 year old female with a history of DVT in October now presents with persistent left lower extremity swelling and ultrasound demonstrating likely persistent DVT.  She has been on Xarelto at this time.  She is now indicated for venogram possible intervention.  Findings: Flow in the left lower extremity was stagnant throughout but by venogram all veins were patent.  There was a focal occlusive area approximately 2 cm in length at the common and external iliac vein junction at the hypogastric on the left.  After stenting and ballooning we had a diameter of 12 millimeters in the stent and venogram demonstrated flow in the lower extremity veins were previously was stagnant and no further residual stenosis in the left common  and external iliac vein junction.   04/12/2018 Imaging   US Venous Doppler Right: No evidence of common femoral vein obstruction. Left: There is no evidence of deep vein thrombosis in the lower extremity. However, portions of this examination were limited- see technologist comments above. Left groin: Large hypoechoic area with mixed echoes noted measuring nearly 10 cm. Possible  hematoma versus unknown etiology. Ultrasound characteristics of enlarged lymph nodes noted in the groin.      05/15/2018 Imaging   US Venous Doppler Right: No evidence of deep vein thrombosis in the lower extremity. No indirect evidence of obstruction proximal to the inguinal ligament. Left: No reflux was noted in the common femoral vein , femoral vein in the thigh, popliteal vein, great saphenous vein at the saphenofemoral junction, great saphenous vein at the proximal thigh, great saphenous vein at the mid thigh, great saphenous vein  at the distal thigh, great saphenous vein at the knee, origin of the small saphenous vein, proximal small saphenous vein, and mid small saphenous vein. There is no evidence of deep vein thrombosis in the lower extremity. There is no evidence of superficial venous thrombosis. No cystic structure found in the popliteal fossa. Unable to evaluate extension of common femoral vein obstruction proximal to the inguinal ligament.   06/01/2018 Imaging   1. 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 Additionally, there is lucency involving the medial sidewall of the left acetabulum with potential nondisplaced pathologic fracture. Further evaluation with contrast-enhanced pelvic MRI could be performed as clinically indicated. 2. The left pelvic arterial and venous system is encased by this infiltrative left pelvic sidewall mass however while difficult to ascertain, the  left external iliac venous stent  appears patent.   06/18/2018 Pathology Results   Lymph node for lymphoma, Left Inguinal - METASTATIC ADENOCARCINOMA, SEE COMMENT. Microscopic Comment Immunohistochemistry is positive for cytokeratin 7, PAX8, ER, and PR. Cytokeratin 5/6,and p63 are negative. The immunoprofile along with the patient's history are consistent with a gynecologic primary.   06/18/2018 Surgery   Pre-op Diagnosis: INGUINAL LYMPHADENOPATHY, PELVIC MASS     Procedure(s): EXCISIONAL BIOPSY DEEP LEFT INGUINAL LYMPH NODE  Surgeon(s): Coralie Keens, MD    06/24/2018 Cancer Staging   Staging form: Corpus Uteri - Carcinoma and Carcinosarcoma, AJCC 8th Edition - Clinical: Stage IVB (cT1a, cN2, pM1) - Signed by [name], MD on 06/24/2018    Genetic Testing   Patient has genetic testing done for MMR on pathology from 06/18/2018. Results revealed patient has the following mutation(s): MMR: abnormal   06/29/2018 Procedure   Placement of a subcutaneous port device. Catheter tip at the SVC and right atrium junction.    Genetic Testing   Patient has genetic testing done for MSI on pathology from 06/18/2018. Results revealed patient has the following mutation(s): MSI: High   07/02/2018 PET scan   Previous hysterectomy, with asymmetric focus of hypermetabolic activity in the left vaginal cuff, suspicious for residual or recurrent carcinoma.  Large hypermetabolic soft tissue mass involving the left pelvic sidewall and acetabulum, consistent with metastatic disease.  No evidence metastatic disease within the abdomen, chest, or neck.   07/09/2018 Tumor Marker   Patient's tumor was tested for the following markers: CA-125 Results of the tumor marker test revealed 9   07/10/2018 - 08/24/2018 Chemotherapy   The patient had carboplatin and taxol x 3 cycles   07/17/2018 Genetic Testing   Negative genetic testing on the common hereditary cancer panel.  The Common Hereditary Gene Panel offered by [facility] includes  sequencing and/or deletion duplication testing of the following 48 genes: APC, ATM, AXIN2, BARD1, BMPR1A, BRCA1, BRCA2, BRIP1, CDH1, CDK4, CDKN2A (p14ARF), CDKN2A (p16INK4a), CHEK2, CTNNA1, DICER1, EPCAM (Deletion/duplication testing only), GREM1 (promoter region deletion/duplication testing only), KIT, MEN1, MLH1, MSH2, MSH3, MSH6, MUTYH, NBN, NF1, NHTL1, PALB2, PDGFRA, PMS2, POLD1, POLE, PTEN, RAD50, RAD51C, RAD51D, RNF43, SDHB, SDHC, SDHD, SMAD4, SMARCA4. STK11, TP53, TSC1, TSC2, and VHL.  The following genes were evaluated for sequence changes only: SDHA and HOXB13 c.251G>A variant only. The report date is Jul 17, 2018.    10/03/2018 Imaging   CT abdomen and pelvis 1.  No acute intra-abdominal process. 2. Grossly unchanged left pelvic sidewall mass with osseous involvement of the medial acetabulum. Progressive mild displacement of the associated comminuted pathologic fracture involving the right acetabulum and puboacetabular junction.  3. New venous stents extending from the left common iliac vein origin to the proximal left common femoral vein. The stents are patent.   11/06/2018 -  Chemotherapy   The patient had pembrolizumab for chemotherapy treatment.     01/28/2019 Imaging   1. No substantial interval change in exam. 2. Interval development of mild fullness in the left intrarenal collecting system and ureter without overt hydronephrosis at this time. 3. Abnormal soft tissue along the left pelvic sidewall has decreased slightly in the interval. 4. Similar appearance of ill-defined fascial planes in the pelvis with some peritoneal thickening along the right pelvic sidewall and potentially involving the sigmoid mesocolon. 5. No substantial ascites.   05/03/2019 Imaging   1. Stable mild left pelvic sidewall soft tissue density. No new or progressive disease identified within the abdomen or pelvis.  2.  Colonic diverticulosis. No radiographic evidence of diverticulitis.   Aortic  Atherosclerosis (ICD10-I70.0).   Metastasis to lymph nodes ([place])  06/23/2018 Initial Diagnosis   Metastasis to lymph nodes ([place])   07/10/2018 - 09/14/2018 Chemotherapy   The patient had palonosetron (ALOXI) injection 0.25 mg, 0.25 mg, Intravenous,  Once, 3 of 6 cycles Administration: 0.25 mg (07/10/2018), 0.25 mg (07/31/2018), 0.25 mg (08/24/2018) CARBOplatin (PARAPLATIN) 480 mg in sodium chloride 0.9 % 250 mL chemo infusion, 480 mg (100 % of original dose 482.5 mg), Intravenous,  Once, 3 of 6 cycles Dose modification: 482.5 mg (original dose 482.5 mg, Cycle 1) Administration: 480 mg (07/10/2018), 480 mg (07/31/2018), 480 mg (08/24/2018) PACLitaxel (TAXOL) 276 mg in sodium chloride 0.9 % 250 mL chemo infusion (> 52m/m2), 140 mg/m2 = 276 mg (80 % of original dose 175 mg/m2), Intravenous,  Once, 3 of 6 cycles Dose modification: 140 mg/m2 (80 % of original dose 175 mg/m2, Cycle 1, Reason: Dose Not Tolerated) Administration: 276 mg (07/10/2018), 276 mg (07/31/2018), 276 mg (08/24/2018) fosaprepitant (EMEND) 150 mg, dexamethasone (DECADRON) 12 mg in sodium chloride 0.9 % 145 mL IVPB, , Intravenous,  Once, 3 of 6 cycles Administration:  (07/10/2018),  (07/31/2018),  (08/24/2018)  for chemotherapy treatment.    11/06/2018 -  Chemotherapy   The patient had pembrolizumab for chemotherapy treatment.     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  06/24/2018 Initial Diagnosis   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   07/10/2018 - 09/14/2018 Chemotherapy   The patient had palonosetron (ALOXI) injection 0.25 mg, 0.25 mg, Intravenous,  Once, 3 of 6 cycles Administration: 0.25 mg (07/10/2018), 0.25 mg (07/31/2018), 0.25 mg (08/24/2018) CARBOplatin (PARAPLATIN) 480 mg in sodium chloride 0.9 % 250 mL chemo infusion, 480 mg (100 % of original dose 482.5 mg), Intravenous,  Once, 3 of 6 cycles Dose modification: 482.5 mg (original dose 482.5 mg, Cycle 1) Administration: 480 mg (07/10/2018), 480 mg (07/31/2018), 480 mg (08/24/2018) PACLitaxel (TAXOL) 276 mg in  sodium chloride 0.9 % 250 mL chemo infusion (> 863mm2), 140 mg/m2 = 276 mg (80 % of original dose 175 mg/m2), Intravenous,  Once, 3 of 6 cycles Dose modification: 140 mg/m2 (80 % of original dose 175 mg/m2, Cycle 1, Reason: Dose Not Tolerated) Administration: 276 mg (07/10/2018), 276 mg (07/31/2018), 276 mg (08/24/2018) fosaprepitant (EMEND) 150 mg, dexamethasone (DECADRON) 12 mg in sodium chloride 0.9 % 145 mL IVPB, , Intravenous,  Once, 3 of 6 cycles Administration:  (07/10/2018),  (07/31/2018),  (08/24/2018)  for chemotherapy treatment.    11/06/2018 -  Chemotherapy   The patient had pembrolizumab for chemotherapy treatment.     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       REVIEW OF SYSTEMS:   Constitutional: Denies fevers, chills or abnormal weight loss Eyes: Denies blurriness of vision Ears, nose, mouth, throat, and face: Denies mucositis or sore throat Respiratory: Denies cough, dyspnea or wheezes Cardiovascular: Denies palpitation, chest discomfort or lower extremity swelling Gastrointestinal:  Denies nausea, heartburn or change in bowel habits Skin: Denies abnormal skin rashes Lymphatics: Denies new lymphadenopathy or easy bruising Neurological:Denies numbness, tingling or new weaknesses Behavioral/Psych: Mood is stable, no new changes  All other systems were reviewed with the patient and are negative.  I have reviewed the past medical history, past surgical history, social history and family history with the  patient and they are unchanged from previous note.  ALLERGIES:  has No Known Allergies.  MEDICATIONS:  Current Outpatient Medications  Medication Sig Dispense Refill  . clopidogrel (PLAVIX) 75 MG tablet Take 1 tablet (75 mg  total) by mouth daily with breakfast. 30 tablet 1  . diclofenac sodium (VOLTAREN) 1 % GEL APPLY 4GRAMS 4 TIMES A DAY AS NEEDED FOR PAINS    . Eliquis DVT/PE Starter Pack (ELIQUIS STARTER PACK) 5 MG TABS Take as directed on package: start with two-19m tablets twice daily for 7 days. On day 8, switch to one-554mtablet twice daily. 1 each 0  . methadone (DOLOPHINE) 10 MG tablet Take 1 tablet (10 mg total) by mouth every 12 (twelve) hours. 60 tablet 0  . morphine (MSIR) 15 MG tablet Take 1 tablet (15 mg total) by mouth every 6 (six) hours as needed for severe pain. 60 tablet 0  . Olopatadine HCl 0.2 % SOLN Place 1 drop into both eyes daily.     No current facility-administered medications for this visit.   Facility-Administered Medications Ordered in Other Visits  Medication Dose Route Frequency Provider Last Rate Last Admin  . sodium chloride flush (NS) 0.9 % injection 10 mL  10 mL Intracatheter PRN GoAlvy BimlerNi, MD   10 mL at 05/04/19 1419    PHYSICAL EXAMINATION: ECOG PERFORMANCE STATUS: 2 - Symptomatic, <50% confined to bed  Vitals:   05/04/19 1217  BP: 104/70  Pulse: 81  Resp: 18  Temp: 98 F (36.7 C)  SpO2: 97%   Filed Weights   05/04/19 1217  Weight: 194 lb 6.4 oz (88.2 kg)    GENERAL:alert, no distress and comfortable NEURO: alert & oriented x 3 with fluent speech, no focal motor/sensory deficits  LABORATORY DATA:  I have reviewed the data as listed    Component Value Date/Time   NA 141 05/03/2019 0947   K 3.7 05/03/2019 0947   CL 105 05/03/2019 0947   CO2 25 05/03/2019 0947   GLUCOSE 94 05/03/2019 0947   BUN 12 05/03/2019 0947   CREATININE 0.70 05/03/2019 0947   CALCIUM 9.2 05/03/2019 0947   PROT 7.1 05/03/2019 0947   ALBUMIN 3.8 05/03/2019 0947   AST 13 (L) 05/03/2019 0947   ALT 7 05/03/2019 0947   ALKPHOS 97 05/03/2019 0947   BILITOT 0.5 05/03/2019 0947   GFRNONAA >60 05/03/2019 0947   GFRAA >60 05/03/2019 0947    No results found for: SPEP, UPEP  Lab  Results  Component Value Date   WBC 2.9 (L) 05/03/2019   NEUTROABS 1.9 05/03/2019   HGB 12.2 05/03/2019   HCT 37.1 05/03/2019   MCV 90.7 05/03/2019   PLT 177 05/03/2019      Chemistry      Component Value Date/Time   NA 141 05/03/2019 0947   K 3.7 05/03/2019 0947   CL 105 05/03/2019 0947   CO2 25 05/03/2019 0947   BUN 12 05/03/2019 0947   CREATININE 0.70 05/03/2019 0947      Component Value Date/Time   CALCIUM 9.2 05/03/2019 0947   ALKPHOS 97 05/03/2019 0947   AST 13 (L) 05/03/2019 0947   ALT 7 05/03/2019 0947   BILITOT 0.5 05/03/2019 0947       RADIOGRAPHIC STUDIES: I have personally reviewed the radiological images as listed and agreed with the findings in the report. CT ABDOMEN PELVIS W CONTRAST  Result Date: 05/03/2019 CLINICAL DATA:  Follow-up recurrent endometrial carcinoma. Undergoing chemotherapy. Restaging. EXAM: CT ABDOMEN AND PELVIS  WITH CONTRAST TECHNIQUE: Multidetector CT imaging of the abdomen and pelvis was performed using the standard protocol following bolus administration of intravenous contrast. CONTRAST:  128m OMNIPAQUE IOHEXOL 300 MG/ML  SOLN COMPARISON:  01/28/2019 FINDINGS: Lower Chest: No acute findings. Hepatobiliary: No hepatic masses identified. Prior cholecystectomy. No evidence of biliary obstruction. Pancreas:  No mass or inflammatory changes. Spleen: Within normal limits in size and appearance. Adrenals/Urinary Tract: No masses identified. No evidence of ureteral calculi or hydronephrosis. Stomach/Bowel: No evidence of obstruction, inflammatory process or abnormal fluid collections. Normal appendix visualized. Diffuse colonic diverticulosis is again seen, however there is no evidence of diverticulitis. Vascular/Lymphatic: Soft tissue density along the left pelvic sidewall and iliac vessels remains stable since previous study, measuring 1.8 cm in short axis on image 54/2 compared to 1.8 cm previously. No new lymphadenopathy or abnormal soft tissue  density identified in the pelvis or abdomen. Aortic atherosclerosis incidentally noted. Stent again seen within the left common and external iliac arteries. Reproductive: Prior hysterectomy noted. Adnexal regions are unremarkable in appearance. Other:  None. Musculoskeletal: No suspicious bone lesions identified. Old left acetabular fracture and severe left hip osteoarthritis again noted. IMPRESSION: 1. Stable mild left pelvic sidewall soft tissue density. No new or progressive disease identified within the abdomen or pelvis. 2. Colonic diverticulosis. No radiographic evidence of diverticulitis. Aortic Atherosclerosis (ICD10-I70.0). Electronically Signed   By: JMarlaine HindM.D.   On: 05/03/2019 12:18

## 2019-05-05 ENCOUNTER — Telehealth: Payer: Self-pay | Admitting: Hematology and Oncology

## 2019-05-05 NOTE — Telephone Encounter (Signed)
Scheduled per 3/16 sch msg. Called and spoke with pt, confirmed 4/6 appt

## 2019-05-19 NOTE — Progress Notes (Signed)
Pharmacist Chemotherapy Monitoring - Follow Up Assessment    I verify that I have reviewed each item in the below checklist:  . Regimen for the patient is scheduled for the appropriate day and plan matches scheduled date. Marland Kitchen Appropriate non-routine labs are ordered dependent on drug ordered. . If applicable, additional medications reviewed and ordered per protocol based on lifetime cumulative doses and/or treatment regimen.   Plan for follow-up and/or issues identified: No . I-vent associated with next due treatment: No . MD and/or nursing notified: No  Britt Boozer 05/19/2019 1:50 PM

## 2019-05-25 ENCOUNTER — Inpatient Hospital Stay (HOSPITAL_BASED_OUTPATIENT_CLINIC_OR_DEPARTMENT_OTHER): Payer: Medicare Other | Admitting: Hematology and Oncology

## 2019-05-25 ENCOUNTER — Inpatient Hospital Stay: Payer: Medicare Other | Attending: Hematology and Oncology

## 2019-05-25 ENCOUNTER — Inpatient Hospital Stay: Payer: Medicare Other

## 2019-05-25 ENCOUNTER — Other Ambulatory Visit: Payer: Self-pay

## 2019-05-25 ENCOUNTER — Encounter: Payer: Self-pay | Admitting: Hematology and Oncology

## 2019-05-25 DIAGNOSIS — C7951 Secondary malignant neoplasm of bone: Secondary | ICD-10-CM

## 2019-05-25 DIAGNOSIS — Z7189 Other specified counseling: Secondary | ICD-10-CM

## 2019-05-25 DIAGNOSIS — C541 Malignant neoplasm of endometrium: Secondary | ICD-10-CM | POA: Diagnosis present

## 2019-05-25 DIAGNOSIS — D61818 Other pancytopenia: Secondary | ICD-10-CM | POA: Diagnosis not present

## 2019-05-25 DIAGNOSIS — C801 Malignant (primary) neoplasm, unspecified: Secondary | ICD-10-CM

## 2019-05-25 DIAGNOSIS — I700 Atherosclerosis of aorta: Secondary | ICD-10-CM | POA: Insufficient documentation

## 2019-05-25 DIAGNOSIS — C55 Malignant neoplasm of uterus, part unspecified: Secondary | ICD-10-CM

## 2019-05-25 DIAGNOSIS — Z5111 Encounter for antineoplastic chemotherapy: Secondary | ICD-10-CM | POA: Insufficient documentation

## 2019-05-25 DIAGNOSIS — Z79899 Other long term (current) drug therapy: Secondary | ICD-10-CM | POA: Insufficient documentation

## 2019-05-25 DIAGNOSIS — C774 Secondary and unspecified malignant neoplasm of inguinal and lower limb lymph nodes: Secondary | ICD-10-CM

## 2019-05-25 DIAGNOSIS — I825Z2 Chronic embolism and thrombosis of unspecified deep veins of left distal lower extremity: Secondary | ICD-10-CM | POA: Diagnosis not present

## 2019-05-25 DIAGNOSIS — E538 Deficiency of other specified B group vitamins: Secondary | ICD-10-CM | POA: Diagnosis not present

## 2019-05-25 DIAGNOSIS — Z86718 Personal history of other venous thrombosis and embolism: Secondary | ICD-10-CM | POA: Insufficient documentation

## 2019-05-25 DIAGNOSIS — D509 Iron deficiency anemia, unspecified: Secondary | ICD-10-CM | POA: Insufficient documentation

## 2019-05-25 DIAGNOSIS — K5730 Diverticulosis of large intestine without perforation or abscess without bleeding: Secondary | ICD-10-CM | POA: Diagnosis not present

## 2019-05-25 DIAGNOSIS — G893 Neoplasm related pain (acute) (chronic): Secondary | ICD-10-CM

## 2019-05-25 LAB — CMP (CANCER CENTER ONLY)
ALT: <6 U/L (ref 0–44)
AST: 10 U/L — ABNORMAL LOW (ref 15–41)
Albumin: 3.7 g/dL (ref 3.5–5.0)
Alkaline Phosphatase: 103 U/L (ref 38–126)
Anion gap: 8 (ref 5–15)
BUN: 17 mg/dL (ref 8–23)
CO2: 25 mmol/L (ref 22–32)
Calcium: 9.2 mg/dL (ref 8.9–10.3)
Chloride: 106 mmol/L (ref 98–111)
Creatinine: 0.72 mg/dL (ref 0.44–1.00)
GFR, Est AFR Am: >60 mL/min (ref >60)
GFR, Estimated: >60 mL/min (ref >60)
Glucose, Bld: 83 mg/dL (ref 70–99)
Potassium: 3.9 mmol/L (ref 3.5–5.1)
Sodium: 139 mmol/L (ref 135–145)
Total Bilirubin: 0.4 mg/dL (ref 0.3–1.2)
Total Protein: 7.1 g/dL (ref 6.5–8.1)

## 2019-05-25 LAB — CBC WITH DIFFERENTIAL (CANCER CENTER ONLY)
Abs Immature Granulocytes: 0 10*3/uL (ref 0.00–0.07)
Basophils Absolute: 0 10*3/uL (ref 0.0–0.1)
Basophils Relative: 0 %
Eosinophils Absolute: 0 10*3/uL (ref 0.0–0.5)
Eosinophils Relative: 1 %
HCT: 35.5 % — ABNORMAL LOW (ref 36.0–46.0)
Hemoglobin: 11.4 g/dL — ABNORMAL LOW (ref 12.0–15.0)
Immature Granulocytes: 0 %
Lymphocytes Relative: 24 %
Lymphs Abs: 0.7 10*3/uL (ref 0.7–4.0)
MCH: 29.6 pg (ref 26.0–34.0)
MCHC: 32.1 g/dL (ref 30.0–36.0)
MCV: 92.2 fL (ref 80.0–100.0)
Monocytes Absolute: 0.4 10*3/uL (ref 0.1–1.0)
Monocytes Relative: 14 %
Neutro Abs: 1.8 10*3/uL (ref 1.7–7.7)
Neutrophils Relative %: 61 %
Platelet Count: 182 10*3/uL (ref 150–400)
RBC: 3.85 MIL/uL — ABNORMAL LOW (ref 3.87–5.11)
RDW: 14.1 % (ref 11.5–15.5)
WBC Count: 3 10*3/uL — ABNORMAL LOW (ref 4.0–10.5)
nRBC: 0 % (ref 0.0–0.2)

## 2019-05-25 LAB — TSH: TSH: 1.525 u[IU]/mL (ref 0.308–3.960)

## 2019-05-25 MED ORDER — SODIUM CHLORIDE 0.9 % IV SOLN
Freq: Once | INTRAVENOUS | Status: AC
  Filled 2019-05-25: qty 250

## 2019-05-25 MED ORDER — HEPARIN SOD (PORK) LOCK FLUSH 100 UNIT/ML IV SOLN
500.0000 [IU] | Freq: Once | INTRAVENOUS | Status: AC | PRN
  Administered 2019-05-25: 500 [IU]
  Filled 2019-05-25: qty 5

## 2019-05-25 MED ORDER — SODIUM CHLORIDE 0.9% FLUSH
10.0000 mL | Freq: Once | INTRAVENOUS | Status: AC
  Administered 2019-05-25: 10 mL
  Filled 2019-05-25: qty 10

## 2019-05-25 MED ORDER — SODIUM CHLORIDE 0.9% FLUSH
10.0000 mL | INTRAVENOUS | Status: DC | PRN
  Administered 2019-05-25: 10 mL
  Filled 2019-05-25: qty 10

## 2019-05-25 MED ORDER — SODIUM CHLORIDE 0.9 % IV SOLN
200.0000 mg | Freq: Once | INTRAVENOUS | Status: AC
  Administered 2019-05-25: 200 mg via INTRAVENOUS
  Filled 2019-05-25: qty 8

## 2019-05-25 NOTE — Assessment & Plan Note (Signed)
She was found to have iron deficiency anemia and borderline vitamin B12 deficiency After receiving intravenous iron infusion, her blood counts are stable We will continue to monitor closely She is not symptomatic We will proceed without delay 

## 2019-05-25 NOTE — Assessment & Plan Note (Signed)
She has excellent pain control She will continue current prescribed morphine sulfate and methadone as needed We discussed narcotic refill policy 

## 2019-05-25 NOTE — Patient Instructions (Signed)

## 2019-05-25 NOTE — Assessment & Plan Note (Signed)
She will continue anticoagulation therapy indefinitely 

## 2019-05-25 NOTE — Assessment & Plan Note (Signed)
She has excellent response to treatment with her recent CT imaging from March There has been no growth in the lymph node in the pelvis She has nearly no side effects from treatment so far We will continue treatment indefinitely every 3 weeks I do not plan to repeat her CT imaging again for a few more months, probably next due in September

## 2019-05-25 NOTE — Progress Notes (Signed)
[place] OFFICE PROGRESS NOTE  Patient Care Team: Nolene Ebbs, MD as PCP - General (Internal Medicine)  ASSESSMENT & PLAN:  Uterine cancer [facility] [place]) She has excellent response to treatment with her recent CT imaging from March There has been no growth in the lymph node in the pelvis She has nearly no side effects from treatment so far We will continue treatment indefinitely every 3 weeks I do not plan to repeat her CT imaging again for a few more months, probably next due in September  Cancer associated pain She has excellent pain control She will continue current prescribed morphine sulfate and methadone as needed We discussed narcotic refill policy  Lower leg DVT (deep venous thromboembolism), chronic, left ([place]) She will continue anticoagulation therapy indefinitely  Pancytopenia, acquired ([place]) She was found to have iron deficiency anemia and borderline vitamin B12 deficiency After receiving intravenous iron infusion, her blood counts are stable We will continue to monitor closely She is not symptomatic We will proceed without delay   No orders of the defined types were placed in this encounter.   All questions were answered. The patient knows to call the clinic with any problems, questions or concerns. The total time spent in the appointment was 20 minutes encounter with patients including review of chart and various tests results, discussions about plan of care and coordination of care plan   [name], MD 05/25/2019 1:25 PM  INTERVAL HISTORY: Please see below for problem oriented charting. 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 Uterus +/- tubes/ovaries, neoplastic, with left fallopian tube and ovary - INVASIVE ENDOMETRIOID CARCINOMA (1.5 CM), FIGO GRADE II, ARISING IN A BACKGROUND OF ATYPICAL COMPLEX HYPERPLASIA, CONFINED WITHIN INNER HALF OF THE MYOMETRIUM. - ENDOMETRIAL POLYP WITH ASSOCIATED ATYPICAL COMPLEX HYPERPLASIA. - MYOMETRIUM: LEIOMYOMATA. - CERVIX: BENIGN SQUAMOUS MUCOSA AND ENDOCERVICAL MUCOSA, NO DYSPLASIA OR MALIGNANCY. - LEFT OVARY: BENIGN OVARIAN TISSUE WITH ENDOSALPINGOSIS, NO EVIDENCE OF ATYPIA OR MALIGNANCY. - LEFT FALLOPIAN TUBE: NO HISTOLOGIC ABNORMALITIES. - PLEASE SEE ONCOLOGY TEMPLATE FOR DETAIL. 2. Ovary and fallopian tube, right - BENIGN OVARIAN TISSUE WITH ENDOSALPINGOSIS, NO ATYPIA OR MALIGNANCY. - BENIGN FALLOPIAN TUBAL TISSUE, NO PATHOLOGIC ABNORMALITIES. Microscopic Comment 1. UTERUS Specimen: Uterus, cervix, bilateral ovaries and fallopian tubes Procedure: Total hysterectomy and bilateral salpingo-oophorectomy Lymph node sampling performed: No Specimen integrity: Intact Maximum tumor size (cm): 1.5 cm, glass slide measurement Histologic type: Invasive endometrioid carcinoma Grade: FIGO grade II Myometrial invasion: 1 cm where myometrium is 2.3 cm in thickness Cervical stromal involvement: No Extent of involvement of other organs: No Lymph vascular invasion: Not identified Peritoneal washings: Negative ([id number]) Lymph nodes: number examined N/A; number positive N/A TNM code: pT1a, pNX 1 oFf 3IGO Stage (based on pathologic findings, needs clinical correlation): IA  Comments: Sections the endomyometrium away from the grossly identified endometrial polyp show an invasive FIGO grade II endometrioid carcinoma. The tumor is confined within inner half of the myometrium. No angiolymphatic invasion is identified. No cervical stromal involvement is identified. Sections of the grossly identified  endometrial polyp show an endometrial polyp with associated atypical compacted hyperplasia with no definitive evidence of  carcinoma.   12/07/2017 Imaging   US venous Doppler Right: No evidence of common femoral vein obstruction. Left: Findings consistent with acute deep vein thrombosis involving the left femoral vein, left proximal profunda vein, and left popliteal vein. Unable to adequately interrogate the common femoral and higher, or the calf secondary to significant edema and body habitus   12/07/2017 [facility] Admission   She presented to the ER and was diagnosed with acute DVT   01/18/2018 - 01/21/2018 Hospital Admission   She was admitted to the hospital for management of severe persistent DVT   01/18/2018 Imaging   US venous Doppler Right: No evidence of common femoral vein obstruction. Left: Findings consistent with acute deep vein thrombosis involving the left common femoral vein, and left popliteal vein.   01/19/2018 Surgery   Pre-operative Diagnosis: Subacute DVT with severe post thrombotic syndrome Post-operative diagnosis:  Same Surgeon:  Erlene Quan C. Donzetta Matters, MD Procedure Performed: 1.  Ultrasound-guided cannulation left small saphenous vein 2.  Left lower extremity and central venography 3.  Intravascular ultrasound of left popliteal, femoral, common femoral, external and common iliac veins and IVC 4.  Stent of left common and external iliac veins with 14 x 60 mm Vici 5.  Moderate sedation with fentanyl and Versed for 50 minutes  Indications: 74 year old female with a history of DVT in October now presents with persistent left lower extremity swelling and ultrasound demonstrating likely persistent DVT.  She has been on Xarelto at this time.  She is now indicated for venogram possible intervention.  Findings: Flow in the left lower extremity was stagnant throughout but by venogram all veins were patent.  There was a focal occlusive area approximately 2 cm in length at the  common and external iliac vein junction at the hypogastric on the left.  After stenting and ballooning we had a diameter of 12 millimeters in the stent and venogram demonstrated flow in the lower extremity veins were previously was stagnant and no further residual stenosis in the left common and external iliac vein junction.   04/12/2018 Imaging   US Venous Doppler Right: No evidence of common femoral vein obstruction. Left: There is no evidence of deep vein thrombosis in the lower extremity. However, portions of this examination were limited- see technologist comments above. Left groin: Large hypoechoic area with mixed echoes noted measuring nearly 10 cm. Possible  hematoma versus unknown etiology. Ultrasound characteristics of enlarged lymph nodes noted in the groin.      05/15/2018 Imaging   US Venous Doppler Right: No evidence of deep vein thrombosis in the lower extremity. No indirect evidence of obstruction proximal to the inguinal ligament. Left: No reflux was noted in the common femoral vein , femoral vein in the thigh, popliteal vein, great saphenous vein at the saphenofemoral junction, great saphenous vein at the proximal thigh, great saphenous vein at the mid thigh, great saphenous vein  at the distal thigh, great saphenous vein at the knee, origin of the small saphenous vein, proximal small saphenous vein, and mid small saphenous vein. There is no evidence of deep vein thrombosis in the lower extremity. There is no evidence of superficial venous thrombosis. No cystic structure found in the popliteal fossa. Unable to evaluate extension of common femoral vein obstruction proximal to the inguinal ligament.   06/01/2018 Imaging   1. 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 Additionally, there is lucency involving the medial sidewall of the  left acetabulum with potential nondisplaced  pathologic fracture. Further evaluation with contrast-enhanced pelvic MRI could be performed as clinically indicated. 2. The left pelvic arterial and venous system is encased by this infiltrative left pelvic sidewall mass however while difficult to ascertain, the left external iliac venous stent appears patent.   06/18/2018 Pathology Results   Lymph node for lymphoma, Left Inguinal - METASTATIC ADENOCARCINOMA, SEE COMMENT. Microscopic Comment Immunohistochemistry is positive for cytokeratin 7, PAX8, ER, and PR. Cytokeratin 5/6,and p63 are negative. The immunoprofile along with the patient's history are consistent with a gynecologic primary.   06/18/2018 Surgery   Pre-op Diagnosis: INGUINAL LYMPHADENOPATHY, PELVIC MASS     Procedure(s): EXCISIONAL BIOPSY DEEP LEFT INGUINAL LYMPH NODE  Surgeon(s): Coralie Keens, MD    06/24/2018 Cancer Staging   Staging form: Corpus Uteri - Carcinoma and Carcinosarcoma, AJCC 8th Edition - Clinical: Stage IVB (cT1a, cN2, pM1) - Signed by [name], MD on 06/24/2018    Genetic Testing   Patient has genetic testing done for MMR on pathology from 06/18/2018. Results revealed patient has the following mutation(s): MMR: abnormal   06/29/2018 Procedure   Placement of a subcutaneous port device. Catheter tip at the SVC and right atrium junction.    Genetic Testing   Patient has genetic testing done for MSI on pathology from 06/18/2018. Results revealed patient has the following mutation(s): MSI: High   07/02/2018 PET scan   Previous hysterectomy, with asymmetric focus of hypermetabolic activity in the left vaginal cuff, suspicious for residual or recurrent carcinoma.  Large hypermetabolic soft tissue mass involving the left pelvic sidewall and acetabulum, consistent with metastatic disease.  No evidence metastatic disease within the abdomen, chest, or neck.   07/09/2018 Tumor Marker   Patient's tumor was tested for the following markers:  CA-125 Results of the tumor marker test revealed 9   07/10/2018 - 08/24/2018 Chemotherapy   The patient had carboplatin and taxol x 3 cycles   07/17/2018 Genetic Testing   Negative genetic testing on the common hereditary cancer panel.  The Common Hereditary Gene Panel offered by [facility] includes sequencing and/or deletion duplication testing of the following 48 genes: APC, ATM, AXIN2, BARD1, BMPR1A, BRCA1, BRCA2, BRIP1, CDH1, CDK4, CDKN2A (p14ARF), CDKN2A (p16INK4a), CHEK2, CTNNA1, DICER1, EPCAM (Deletion/duplication testing only), GREM1 (promoter region deletion/duplication testing only), KIT, MEN1, MLH1, MSH2, MSH3, MSH6, MUTYH, NBN, NF1, NHTL1, PALB2, PDGFRA, PMS2, POLD1, POLE, PTEN, RAD50, RAD51C, RAD51D, RNF43, SDHB, SDHC, SDHD, SMAD4, SMARCA4. STK11, TP53, TSC1, TSC2, and VHL.  The following genes were evaluated for sequence changes only: SDHA and HOXB13 c.251G>A variant only. The report date is Jul 17, 2018.    10/03/2018 Imaging   CT abdomen and pelvis 1.  No acute intra-abdominal process. 2. Grossly unchanged left pelvic sidewall mass with osseous involvement of the medial acetabulum. Progressive mild displacement of the associated comminuted pathologic fracture involving the right acetabulum and puboacetabular junction.  3. New venous stents extending from the left common iliac vein origin to the proximal left common femoral vein. The stents are patent.   11/06/2018 -  Chemotherapy   The patient had pembrolizumab for chemotherapy treatment.     01/28/2019 Imaging   1. No substantial interval change in exam. 2. Interval development of mild fullness in the left intrarenal collecting system and ureter without overt hydronephrosis at this time. 3. Abnormal soft tissue along the left pelvic sidewall has decreased slightly in the interval. 4. Similar appearance of ill-defined fascial planes in the pelvis with some peritoneal thickening along  the right pelvic sidewall and potentially involving  the sigmoid mesocolon. 5. No substantial ascites.   05/03/2019 Imaging   1. Stable mild left pelvic sidewall soft tissue density. No new or progressive disease identified within the abdomen or pelvis.  2. Colonic diverticulosis. No radiographic evidence of diverticulitis.   Aortic Atherosclerosis (ICD10-I70.0).   Metastasis to lymph nodes ([place])  06/23/2018 Initial Diagnosis   Metastasis to lymph nodes ([place])   07/10/2018 - 09/14/2018 Chemotherapy   The patient had palonosetron (ALOXI) injection 0.25 mg, 0.25 mg, Intravenous,  Once, 3 of 6 cycles Administration: 0.25 mg (07/10/2018), 0.25 mg (07/31/2018), 0.25 mg (08/24/2018) CARBOplatin (PARAPLATIN) 480 mg in sodium chloride 0.9 % 250 mL chemo infusion, 480 mg (100 % of original dose 482.5 mg), Intravenous,  Once, 3 of 6 cycles Dose modification: 482.5 mg (original dose 482.5 mg, Cycle 1) Administration: 480 mg (07/10/2018), 480 mg (07/31/2018), 480 mg (08/24/2018) PACLitaxel (TAXOL) 276 mg in sodium chloride 0.9 % 250 mL chemo infusion (> 66m/m2), 140 mg/m2 = 276 mg (80 % of original dose 175 mg/m2), Intravenous,  Once, 3 of 6 cycles Dose modification: 140 mg/m2 (80 % of original dose 175 mg/m2, Cycle 1, Reason: Dose Not Tolerated) Administration: 276 mg (07/10/2018), 276 mg (07/31/2018), 276 mg (08/24/2018) fosaprepitant (EMEND) 150 mg, dexamethasone (DECADRON) 12 mg in sodium chloride 0.9 % 145 mL IVPB, , Intravenous,  Once, 3 of 6 cycles Administration:  (07/10/2018),  (07/31/2018),  (08/24/2018)  for chemotherapy treatment.    11/06/2018 -  Chemotherapy   The patient had pembrolizumab for chemotherapy treatment.     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  06/24/2018 Initial Diagnosis   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   07/10/2018 - 09/14/2018 Chemotherapy   The patient had palonosetron (ALOXI) injection 0.25 mg, 0.25 mg, Intravenous,  Once, 3 of 6 cycles Administration: 0.25 mg (07/10/2018), 0.25 mg (07/31/2018), 0.25 mg (08/24/2018) CARBOplatin (PARAPLATIN) 480 mg in sodium  chloride 0.9 % 250 mL chemo infusion, 480 mg (100 % of original dose 482.5 mg), Intravenous,  Once, 3 of 6 cycles Dose modification: 482.5 mg (original dose 482.5 mg, Cycle 1) Administration: 480 mg (07/10/2018), 480 mg (07/31/2018), 480 mg (08/24/2018) PACLitaxel (TAXOL) 276 mg in sodium chloride 0.9 % 250 mL chemo infusion (> 872mm2), 140 mg/m2 = 276 mg (80 % of original dose 175 mg/m2), Intravenous,  Once, 3 of 6 cycles Dose modification: 140 mg/m2 (80 % of original dose 175 mg/m2, Cycle 1, Reason: Dose Not Tolerated) Administration: 276 mg (07/10/2018), 276 mg (07/31/2018), 276 mg (08/24/2018) fosaprepitant (EMEND) 150 mg, dexamethasone (DECADRON) 12 mg in sodium chloride 0.9 % 145 mL IVPB, , Intravenous,  Once, 3 of 6 cycles Administration:  (07/10/2018),  (07/31/2018),  (08/24/2018)  for chemotherapy treatment.    11/06/2018 -  Chemotherapy   The patient had pembrolizumab for chemotherapy treatment.     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       REVIEW OF SYSTEMS:   Constitutional: Denies fevers, chills or abnormal weight loss Eyes: Denies blurriness of vision Ears, nose, mouth, throat, and face: Denies mucositis or sore throat Respiratory: Denies cough, dyspnea or wheezes Cardiovascular: Denies palpitation, chest discomfort  Gastrointestinal:  Denies nausea, heartburn or change in bowel habits Skin: Denies abnormal skin rashes Lymphatics: Denies new lymphadenopathy or easy bruising Neurological:Denies numbness, tingling or  new weaknesses Behavioral/Psych: Mood is stable, no new changes  All other systems were reviewed with the patient and are negative.  I have reviewed the past medical history, past surgical history, social history and  family history with the patient and they are unchanged from previous note.  ALLERGIES:  has No Known Allergies.  MEDICATIONS:  Current Outpatient Medications  Medication Sig Dispense Refill  . clopidogrel (PLAVIX) 75 MG tablet Take 1 tablet (75 mg total) by mouth daily with breakfast. 30 tablet 1  . diclofenac sodium (VOLTAREN) 1 % GEL APPLY 4GRAMS 4 TIMES A DAY AS NEEDED FOR PAINS    . Eliquis DVT/PE Starter Pack (ELIQUIS STARTER PACK) 5 MG TABS Take as directed on package: start with two-68m tablets twice daily for 7 days. On day 8, switch to one-567mtablet twice daily. 1 each 0  . methadone (DOLOPHINE) 10 MG tablet Take 1 tablet (10 mg total) by mouth every 12 (twelve) hours. 60 tablet 0  . morphine (MSIR) 15 MG tablet Take 1 tablet (15 mg total) by mouth every 6 (six) hours as needed for severe pain. 60 tablet 0  . Olopatadine HCl 0.2 % SOLN Place 1 drop into both eyes daily.     No current facility-administered medications for this visit.    PHYSICAL EXAMINATION: ECOG PERFORMANCE STATUS: 1 - Symptomatic but completely ambulatory  Vitals:   05/25/19 1304  BP: (!) 126/53  Pulse: 79  Resp: 18  Temp: 98.3 F (36.8 C)  SpO2: 100%   Filed Weights   05/25/19 1304  Weight: 195 lb 4.8 oz (88.6 kg)    GENERAL:alert, no distress and comfortable SKIN: skin color, texture, turgor are normal, no rashes or significant lesions EYES: normal, Conjunctiva are pink and non-injected, sclera clear OROPHARYNX:no exudate, no erythema and lips, buccal mucosa, and tongue normal  NECK: supple, thyroid normal size, non-tender, without nodularity LYMPH:  no palpable lymphadenopathy in the cervical, axillary or inguinal LUNGS: clear to auscultation and percussion with normal breathing effort HEART: regular rate & rhythm and no murmurs with moderate bilateral lower extremity edema ABDOMEN:abdomen soft, non-tender and normal bowel sounds Musculoskeletal:no cyanosis of digits and no clubbing  NEURO:  alert & oriented x 3 with fluent speech, no focal motor/sensory deficits  LABORATORY DATA:  I have reviewed the data as listed    Component Value Date/Time   NA 139 05/25/2019 1230   K 3.9 05/25/2019 1230   CL 106 05/25/2019 1230   CO2 25 05/25/2019 1230   GLUCOSE 83 05/25/2019 1230   BUN 17 05/25/2019 1230   CREATININE 0.72 05/25/2019 1230   CALCIUM 9.2 05/25/2019 1230   PROT 7.1 05/25/2019 1230   ALBUMIN 3.7 05/25/2019 1230   AST 10 (L) 05/25/2019 1230   ALT <6 05/25/2019 1230   ALKPHOS 103 05/25/2019 1230   BILITOT 0.4 05/25/2019 1230   GFRNONAA >60 05/25/2019 1230   GFRAA >60 05/25/2019 1230    No results found for: SPEP, UPEP  Lab Results  Component Value Date   WBC 3.0 (L) 05/25/2019   NEUTROABS 1.8 05/25/2019   HGB 11.4 (L) 05/25/2019   HCT 35.5 (L) 05/25/2019   MCV 92.2 05/25/2019   PLT 182 05/25/2019      Chemistry      Component Value Date/Time   NA 139 05/25/2019 1230   K 3.9 05/25/2019 1230   CL 106 05/25/2019 1230   CO2 25 05/25/2019 1230   BUN 17 05/25/2019 1230   CREATININE 0.72 05/25/2019 1230  Component Value Date/Time   CALCIUM 9.2 05/25/2019 1230   ALKPHOS 103 05/25/2019 1230   AST 10 (L) 05/25/2019 1230   ALT <6 05/25/2019 1230   BILITOT 0.4 05/25/2019 1230       RADIOGRAPHIC STUDIES: I have personally reviewed the radiological images as listed and agreed with the findings in the report. CT ABDOMEN PELVIS W CONTRAST  Result Date: 05/03/2019 CLINICAL DATA:  Follow-up recurrent endometrial carcinoma. Undergoing chemotherapy. Restaging. EXAM: CT ABDOMEN AND PELVIS WITH CONTRAST TECHNIQUE: Multidetector CT imaging of the abdomen and pelvis was performed using the standard protocol following bolus administration of intravenous contrast. CONTRAST:  114m OMNIPAQUE IOHEXOL 300 MG/ML  SOLN COMPARISON:  01/28/2019 FINDINGS: Lower Chest: No acute findings. Hepatobiliary: No hepatic masses identified. Prior cholecystectomy. No evidence of  biliary obstruction. Pancreas:  No mass or inflammatory changes. Spleen: Within normal limits in size and appearance. Adrenals/Urinary Tract: No masses identified. No evidence of ureteral calculi or hydronephrosis. Stomach/Bowel: No evidence of obstruction, inflammatory process or abnormal fluid collections. Normal appendix visualized. Diffuse colonic diverticulosis is again seen, however there is no evidence of diverticulitis. Vascular/Lymphatic: Soft tissue density along the left pelvic sidewall and iliac vessels remains stable since previous study, measuring 1.8 cm in short axis on image 54/2 compared to 1.8 cm previously. No new lymphadenopathy or abnormal soft tissue density identified in the pelvis or abdomen. Aortic atherosclerosis incidentally noted. Stent again seen within the left common and external iliac arteries. Reproductive: Prior hysterectomy noted. Adnexal regions are unremarkable in appearance. Other:  None. Musculoskeletal: No suspicious bone lesions identified. Old left acetabular fracture and severe left hip osteoarthritis again noted. IMPRESSION: 1. Stable mild left pelvic sidewall soft tissue density. No new or progressive disease identified within the abdomen or pelvis. 2. Colonic diverticulosis. No radiographic evidence of diverticulitis. Aortic Atherosclerosis (ICD10-I70.0). Electronically Signed   By: JMarlaine HindM.D.   On: 05/03/2019 12:18

## 2019-05-25 NOTE — Patient Instructions (Signed)
[facility] Discharge Instructions for Patients Receiving Chemotherapy  Today you received the following chemotherapy agents: pembrolizumab.  To help prevent nausea and vomiting after your treatment, we encourage you to take your nausea medication as directed.   If you develop nausea and vomiting that is not controlled by your nausea medication, call the clinic.   BELOW ARE SYMPTOMS THAT SHOULD BE REPORTED IMMEDIATELY:  *FEVER GREATER THAN 100.5 F  *CHILLS WITH OR WITHOUT FEVER  NAUSEA AND VOMITING THAT IS NOT CONTROLLED WITH YOUR NAUSEA MEDICATION  *UNUSUAL SHORTNESS OF BREATH  *UNUSUAL BRUISING OR BLEEDING  TENDERNESS IN MOUTH AND THROAT WITH OR WITHOUT PRESENCE OF ULCERS  *URINARY PROBLEMS  *BOWEL PROBLEMS  UNUSUAL RASH Items with * indicate a potential emergency and should be followed up as soon as possible.  Feel free to call the clinic should you have any questions or concerns. The clinic phone number is [phone].  Please show the CHEMO ALERT CARD at check-in to the Emergency Department and triage nurse.   

## 2019-05-26 ENCOUNTER — Telehealth: Payer: Self-pay | Admitting: Hematology and Oncology

## 2019-05-26 NOTE — Telephone Encounter (Signed)
Scheduled per 4/6 sch msg. Called and spoke with pt, confirmed 4/30 and 5/21 appts

## 2019-06-14 NOTE — Progress Notes (Signed)

## 2019-06-18 ENCOUNTER — Inpatient Hospital Stay: Payer: Medicare Other

## 2019-06-18 ENCOUNTER — Other Ambulatory Visit: Payer: Self-pay

## 2019-06-18 VITALS — BP 117/72 | HR 84 | Temp 98.7°F | Resp 20

## 2019-06-18 DIAGNOSIS — C7951 Secondary malignant neoplasm of bone: Secondary | ICD-10-CM

## 2019-06-18 DIAGNOSIS — C774 Secondary and unspecified malignant neoplasm of inguinal and lower limb lymph nodes: Secondary | ICD-10-CM

## 2019-06-18 DIAGNOSIS — Z7189 Other specified counseling: Secondary | ICD-10-CM

## 2019-06-18 DIAGNOSIS — C55 Malignant neoplasm of uterus, part unspecified: Secondary | ICD-10-CM

## 2019-06-18 DIAGNOSIS — C801 Malignant (primary) neoplasm, unspecified: Secondary | ICD-10-CM

## 2019-06-18 DIAGNOSIS — C541 Malignant neoplasm of endometrium: Secondary | ICD-10-CM | POA: Diagnosis not present

## 2019-06-18 LAB — CBC WITH DIFFERENTIAL (CANCER CENTER ONLY)
Abs Immature Granulocytes: 0.01 10*3/uL (ref 0.00–0.07)
Basophils Absolute: 0 10*3/uL (ref 0.0–0.1)
Basophils Relative: 0 %
Eosinophils Absolute: 0 10*3/uL (ref 0.0–0.5)
Eosinophils Relative: 1 %
HCT: 36.2 % (ref 36.0–46.0)
Hemoglobin: 11.9 g/dL — ABNORMAL LOW (ref 12.0–15.0)
Immature Granulocytes: 0 %
Lymphocytes Relative: 24 %
Lymphs Abs: 0.8 10*3/uL (ref 0.7–4.0)
MCH: 30.4 pg (ref 26.0–34.0)
MCHC: 32.9 g/dL (ref 30.0–36.0)
MCV: 92.3 fL (ref 80.0–100.0)
Monocytes Absolute: 0.4 10*3/uL (ref 0.1–1.0)
Monocytes Relative: 12 %
Neutro Abs: 2 10*3/uL (ref 1.7–7.7)
Neutrophils Relative %: 63 %
Platelet Count: 191 10*3/uL (ref 150–400)
RBC: 3.92 MIL/uL (ref 3.87–5.11)
RDW: 13.5 % (ref 11.5–15.5)
WBC Count: 3.2 10*3/uL — ABNORMAL LOW (ref 4.0–10.5)
nRBC: 0 % (ref 0.0–0.2)

## 2019-06-18 LAB — TSH: TSH: 1.567 u[IU]/mL (ref 0.308–3.960)

## 2019-06-18 LAB — CMP (CANCER CENTER ONLY)
ALT: 6 U/L (ref 0–44)
AST: 10 U/L — ABNORMAL LOW (ref 15–41)
Albumin: 3.8 g/dL (ref 3.5–5.0)
Alkaline Phosphatase: 103 U/L (ref 38–126)
Anion gap: 7 (ref 5–15)
BUN: 16 mg/dL (ref 8–23)
CO2: 25 mmol/L (ref 22–32)
Calcium: 9.4 mg/dL (ref 8.9–10.3)
Chloride: 107 mmol/L (ref 98–111)
Creatinine: 0.69 mg/dL (ref 0.44–1.00)
GFR, Est AFR Am: >60 mL/min (ref >60)
GFR, Estimated: >60 mL/min (ref >60)
Glucose, Bld: 93 mg/dL (ref 70–99)
Potassium: 3.7 mmol/L (ref 3.5–5.1)
Sodium: 139 mmol/L (ref 135–145)
Total Bilirubin: 0.7 mg/dL (ref 0.3–1.2)
Total Protein: 7.1 g/dL (ref 6.5–8.1)

## 2019-06-18 MED ORDER — SODIUM CHLORIDE 0.9 % IV SOLN
200.0000 mg | Freq: Once | INTRAVENOUS | Status: AC
  Administered 2019-06-18: 200 mg via INTRAVENOUS
  Filled 2019-06-18: qty 8

## 2019-06-18 MED ORDER — SODIUM CHLORIDE 0.9 % IV SOLN
Freq: Once | INTRAVENOUS | Status: AC
  Filled 2019-06-18: qty 250

## 2019-06-18 MED ORDER — SODIUM CHLORIDE 0.9% FLUSH
10.0000 mL | INTRAVENOUS | Status: DC | PRN
  Administered 2019-06-18: 10 mL
  Filled 2019-06-18: qty 10

## 2019-06-18 MED ORDER — HEPARIN SOD (PORK) LOCK FLUSH 100 UNIT/ML IV SOLN
500.0000 [IU] | Freq: Once | INTRAVENOUS | Status: AC | PRN
  Administered 2019-06-18: 500 [IU]
  Filled 2019-06-18: qty 5

## 2019-06-18 MED ORDER — SODIUM CHLORIDE 0.9% FLUSH
10.0000 mL | Freq: Once | INTRAVENOUS | Status: AC
  Administered 2019-06-18: 10 mL
  Filled 2019-06-18: qty 10

## 2019-06-18 NOTE — Patient Instructions (Signed)
[facility] Discharge Instructions for Patients Receiving Chemotherapy  Today you received the following chemotherapy agents: pembrolizumab.  To help prevent nausea and vomiting after your treatment, we encourage you to take your nausea medication as directed.   If you develop nausea and vomiting that is not controlled by your nausea medication, call the clinic.   BELOW ARE SYMPTOMS THAT SHOULD BE REPORTED IMMEDIATELY:  *FEVER GREATER THAN 100.5 F  *CHILLS WITH OR WITHOUT FEVER  NAUSEA AND VOMITING THAT IS NOT CONTROLLED WITH YOUR NAUSEA MEDICATION  *UNUSUAL SHORTNESS OF BREATH  *UNUSUAL BRUISING OR BLEEDING  TENDERNESS IN MOUTH AND THROAT WITH OR WITHOUT PRESENCE OF ULCERS  *URINARY PROBLEMS  *BOWEL PROBLEMS  UNUSUAL RASH Items with * indicate a potential emergency and should be followed up as soon as possible.  Feel free to call the clinic should you have any questions or concerns. The clinic phone number is [phone].  Please show the CHEMO ALERT CARD at check-in to the Emergency Department and triage nurse.   

## 2019-07-05 NOTE — Progress Notes (Signed)
Pharmacist Chemotherapy Monitoring - Follow Up Assessment    I verify that I have reviewed each item in the below checklist:  . Regimen for the patient is scheduled for the appropriate day and plan matches scheduled date. Marland Kitchen Appropriate non-routine labs are ordered dependent on drug ordered. . If applicable, additional medications reviewed and ordered per protocol based on lifetime cumulative doses and/or treatment regimen.   Plan for follow-up and/or issues identified: No . I-vent associated with next due treatment: No . MD and/or nursing notified: No  Ronneisha Jett K 07/05/2019 8:49 AM

## 2019-07-09 ENCOUNTER — Telehealth: Payer: Self-pay | Admitting: Hematology and Oncology

## 2019-07-09 ENCOUNTER — Encounter: Payer: Self-pay | Admitting: Hematology and Oncology

## 2019-07-09 ENCOUNTER — Inpatient Hospital Stay: Payer: Medicare Other

## 2019-07-09 ENCOUNTER — Inpatient Hospital Stay (HOSPITAL_BASED_OUTPATIENT_CLINIC_OR_DEPARTMENT_OTHER): Payer: Medicare Other | Admitting: Hematology and Oncology

## 2019-07-09 ENCOUNTER — Other Ambulatory Visit: Payer: Self-pay

## 2019-07-09 ENCOUNTER — Inpatient Hospital Stay: Payer: Medicare Other | Attending: Hematology and Oncology

## 2019-07-09 DIAGNOSIS — D61818 Other pancytopenia: Secondary | ICD-10-CM

## 2019-07-09 DIAGNOSIS — Z7189 Other specified counseling: Secondary | ICD-10-CM

## 2019-07-09 DIAGNOSIS — C55 Malignant neoplasm of uterus, part unspecified: Secondary | ICD-10-CM | POA: Diagnosis not present

## 2019-07-09 DIAGNOSIS — E538 Deficiency of other specified B group vitamins: Secondary | ICD-10-CM | POA: Diagnosis not present

## 2019-07-09 DIAGNOSIS — E509 Vitamin A deficiency, unspecified: Secondary | ICD-10-CM | POA: Diagnosis not present

## 2019-07-09 DIAGNOSIS — C541 Malignant neoplasm of endometrium: Secondary | ICD-10-CM | POA: Diagnosis present

## 2019-07-09 DIAGNOSIS — C774 Secondary and unspecified malignant neoplasm of inguinal and lower limb lymph nodes: Secondary | ICD-10-CM

## 2019-07-09 DIAGNOSIS — C7951 Secondary malignant neoplasm of bone: Secondary | ICD-10-CM

## 2019-07-09 DIAGNOSIS — G893 Neoplasm related pain (acute) (chronic): Secondary | ICD-10-CM | POA: Insufficient documentation

## 2019-07-09 DIAGNOSIS — C801 Malignant (primary) neoplasm, unspecified: Secondary | ICD-10-CM

## 2019-07-09 DIAGNOSIS — Z79899 Other long term (current) drug therapy: Secondary | ICD-10-CM | POA: Diagnosis not present

## 2019-07-09 DIAGNOSIS — Z5112 Encounter for antineoplastic immunotherapy: Secondary | ICD-10-CM | POA: Diagnosis present

## 2019-07-09 LAB — CMP (CANCER CENTER ONLY)
ALT: 8 U/L (ref 0–44)
AST: 10 U/L — ABNORMAL LOW (ref 15–41)
Albumin: 3.8 g/dL (ref 3.5–5.0)
Alkaline Phosphatase: 103 U/L (ref 38–126)
Anion gap: 7 (ref 5–15)
BUN: 13 mg/dL (ref 8–23)
CO2: 26 mmol/L (ref 22–32)
Calcium: 9.3 mg/dL (ref 8.9–10.3)
Chloride: 106 mmol/L (ref 98–111)
Creatinine: 0.67 mg/dL (ref 0.44–1.00)
GFR, Est AFR Am: >60 mL/min (ref >60)
GFR, Estimated: >60 mL/min (ref >60)
Glucose, Bld: 92 mg/dL (ref 70–99)
Potassium: 3.8 mmol/L (ref 3.5–5.1)
Sodium: 139 mmol/L (ref 135–145)
Total Bilirubin: 0.6 mg/dL (ref 0.3–1.2)
Total Protein: 7.1 g/dL (ref 6.5–8.1)

## 2019-07-09 LAB — CBC WITH DIFFERENTIAL (CANCER CENTER ONLY)
Abs Immature Granulocytes: 0.01 10*3/uL (ref 0.00–0.07)
Basophils Absolute: 0 10*3/uL (ref 0.0–0.1)
Basophils Relative: 0 %
Eosinophils Absolute: 0 10*3/uL (ref 0.0–0.5)
Eosinophils Relative: 1 %
HCT: 35.3 % — ABNORMAL LOW (ref 36.0–46.0)
Hemoglobin: 11.4 g/dL — ABNORMAL LOW (ref 12.0–15.0)
Immature Granulocytes: 0 %
Lymphocytes Relative: 26 %
Lymphs Abs: 0.8 10*3/uL (ref 0.7–4.0)
MCH: 29.9 pg (ref 26.0–34.0)
MCHC: 32.3 g/dL (ref 30.0–36.0)
MCV: 92.7 fL (ref 80.0–100.0)
Monocytes Absolute: 0.4 10*3/uL (ref 0.1–1.0)
Monocytes Relative: 14 %
Neutro Abs: 1.7 10*3/uL (ref 1.7–7.7)
Neutrophils Relative %: 59 %
Platelet Count: 197 10*3/uL (ref 150–400)
RBC: 3.81 MIL/uL — ABNORMAL LOW (ref 3.87–5.11)
RDW: 13.2 % (ref 11.5–15.5)
WBC Count: 2.9 10*3/uL — ABNORMAL LOW (ref 4.0–10.5)
nRBC: 0 % (ref 0.0–0.2)

## 2019-07-09 LAB — TSH: TSH: 2.287 u[IU]/mL (ref 0.308–3.960)

## 2019-07-09 MED ORDER — SODIUM CHLORIDE 0.9 % IV SOLN
200.0000 mg | Freq: Once | INTRAVENOUS | Status: AC
  Administered 2019-07-09: 200 mg via INTRAVENOUS
  Filled 2019-07-09: qty 8

## 2019-07-09 MED ORDER — HEPARIN SOD (PORK) LOCK FLUSH 100 UNIT/ML IV SOLN
500.0000 [IU] | Freq: Once | INTRAVENOUS | Status: AC | PRN
  Administered 2019-07-09: 500 [IU]
  Filled 2019-07-09: qty 5

## 2019-07-09 MED ORDER — SODIUM CHLORIDE 0.9 % IV SOLN
Freq: Once | INTRAVENOUS | Status: AC
  Filled 2019-07-09: qty 250

## 2019-07-09 MED ORDER — SODIUM CHLORIDE 0.9% FLUSH
10.0000 mL | INTRAVENOUS | Status: DC | PRN
  Administered 2019-07-09: 10 mL
  Filled 2019-07-09: qty 10

## 2019-07-09 NOTE — Patient Instructions (Signed)
[facility] Discharge Instructions for Patients Receiving Chemotherapy  Today you received the following chemotherapy agents: pembrolizumab.  To help prevent nausea and vomiting after your treatment, we encourage you to take your nausea medication as directed.   If you develop nausea and vomiting that is not controlled by your nausea medication, call the clinic.   BELOW ARE SYMPTOMS THAT SHOULD BE REPORTED IMMEDIATELY:  *FEVER GREATER THAN 100.5 F  *CHILLS WITH OR WITHOUT FEVER  NAUSEA AND VOMITING THAT IS NOT CONTROLLED WITH YOUR NAUSEA MEDICATION  *UNUSUAL SHORTNESS OF BREATH  *UNUSUAL BRUISING OR BLEEDING  TENDERNESS IN MOUTH AND THROAT WITH OR WITHOUT PRESENCE OF ULCERS  *URINARY PROBLEMS  *BOWEL PROBLEMS  UNUSUAL RASH Items with * indicate a potential emergency and should be followed up as soon as possible.  Feel free to call the clinic should you have any questions or concerns. The clinic phone number is [phone].  Please show the CHEMO ALERT CARD at check-in to the Emergency Department and triage nurse.   

## 2019-07-09 NOTE — Assessment & Plan Note (Signed)
She was found to have iron deficiency anemia and borderline vitamin B12 deficiency After receiving intravenous iron infusion, her blood counts are stable We will continue to monitor closely She is not symptomatic We will proceed without delay 

## 2019-07-09 NOTE — Assessment & Plan Note (Signed)
She has excellent pain control She will continue current prescribed morphine sulfate and methadone as needed We discussed narcotic refill policy 

## 2019-07-09 NOTE — Telephone Encounter (Signed)
Scheduled per 5/21 sch message. Unable to reach pt- left voicemail. Appts added on 6/11 and 7/2.

## 2019-07-09 NOTE — Progress Notes (Signed)
[facility] OFFICE PROGRESS NOTE  Patient Care Team: Nolene Ebbs, MD as PCP - General (Internal Medicine)  ASSESSMENT & PLAN:  Uterine cancer [facility]) She has excellent response to treatment with her recent CT imaging from March There has been no growth in the lymph node in the pelvis She has nearly no side effects from treatment so far We will continue treatment indefinitely every 3 weeks I do not plan to repeat her CT imaging again for a few more months, probably next due in end of July  Pancytopenia, acquired [facility]) She was found to have iron deficiency anemia and borderline vitamin B12 deficiency After receiving intravenous iron infusion, her blood counts are stable We will continue to monitor closely She is not symptomatic We will proceed without delay  Cancer associated pain She has excellent pain control She will continue current prescribed morphine sulfate and methadone as needed We discussed narcotic refill policy   No orders of the defined types were placed in this encounter.   All questions were answered. The patient knows to call the clinic with any problems, questions or concerns. The total time spent in the appointment was 20 minutes encounter with patients including review of chart and various tests results, discussions about plan of care and coordination of care plan   [name], MD 07/09/2019 12:15 PM  INTERVAL HISTORY: Please see below for problem oriented charting. 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 Uterus +/- tubes/ovaries, neoplastic, with left  fallopian tube and ovary - INVASIVE ENDOMETRIOID CARCINOMA (1.5 CM), FIGO GRADE II, ARISING IN A BACKGROUND OF ATYPICAL COMPLEX HYPERPLASIA, CONFINED WITHIN INNER HALF OF THE MYOMETRIUM. - ENDOMETRIAL POLYP WITH ASSOCIATED ATYPICAL COMPLEX HYPERPLASIA. - MYOMETRIUM: LEIOMYOMATA. - CERVIX: BENIGN SQUAMOUS MUCOSA AND ENDOCERVICAL MUCOSA, NO DYSPLASIA OR MALIGNANCY. - LEFT OVARY: BENIGN OVARIAN TISSUE WITH ENDOSALPINGOSIS, NO EVIDENCE OF ATYPIA OR MALIGNANCY. - LEFT FALLOPIAN TUBE: NO HISTOLOGIC ABNORMALITIES. - PLEASE SEE ONCOLOGY TEMPLATE FOR DETAIL. 2. Ovary and fallopian tube, right - BENIGN OVARIAN TISSUE WITH ENDOSALPINGOSIS, NO ATYPIA OR MALIGNANCY. - BENIGN FALLOPIAN TUBAL TISSUE, NO PATHOLOGIC ABNORMALITIES. Microscopic Comment 1. UTERUS Specimen: Uterus, cervix, bilateral ovaries and fallopian tubes Procedure: Total hysterectomy and bilateral salpingo-oophorectomy Lymph node sampling performed: No Specimen integrity: Intact Maximum tumor size (cm): 1.5 cm, glass slide measurement Histologic type: Invasive endometrioid carcinoma Grade: FIGO grade II Myometrial invasion: 1 cm where myometrium is 2.3 cm in thickness Cervical stromal involvement: No Extent of involvement of other organs: No Lymph vascular invasion: Not identified Peritoneal washings: Negative ([id number]) Lymph nodes: number examined N/A; number positive N/A TNM code: pT1a, pNX 1 oFf 3IGO Stage (based on pathologic findings, needs clinical correlation): IA  Comments: Sections the endomyometrium away from the grossly identified endometrial polyp show an invasive FIGO grade II endometrioid carcinoma. The tumor is confined within inner half of the myometrium. No angiolymphatic invasion is identified. No cervical stromal involvement is identified. Sections of the grossly identified endometrial polyp show an endometrial polyp with associated atypical compacted hyperplasia with no definitive evidence of carcinoma.    12/07/2017 Imaging   US venous Doppler Right: No evidence of common femoral vein  obstruction. Left: Findings consistent with acute deep vein thrombosis involving the left femoral vein, left proximal profunda vein, and left popliteal vein. Unable to adequately interrogate the common femoral and higher, or the calf secondary to significant edema and body habitus   12/07/2017 [facility] Ad[facility]   She presented to the ER and was diagnosed with acute DVT   01/18/2018 - 01/21/2018 Hospital Admission   She was admitted to the hospital for management of severe persistent DVT   01/18/2018 Imaging   US venous Doppler Right: No evidence of common femoral vein obstruction. Left: Findings consistent with acute deep vein thrombosis involving the left common femoral vein, and left popliteal vein.   01/19/2018 Surgery   Pre-operative Diagnosis: Subacute DVT with severe post thrombotic syndrome Post-operative diagnosis:  Same Surgeon:  Erlene Quan C. Donzetta Matters, MD Procedure Performed: 1.  Ultrasound-guided cannulation left small saphenous vein 2.  Left lower extremity and central venography 3.  Intravascular ultrasound of left popliteal, femoral, common femoral, external and common iliac veins and IVC 4.  Stent of left common and external iliac veins with 14 x 60 mm Vici 5.  Moderate sedation with fentanyl and Versed for 50 minutes  Indications: 74 year old female with a history of DVT in October now presents with persistent left lower extremity swelling and ultrasound demonstrating likely persistent DVT.  She has been on Xarelto at this time.  She is now indicated for venogram possible intervention.  Findings: Flow in the left lower extremity was stagnant throughout but by venogram all veins were patent.  There was a focal occlusive area approximately 2 cm in length at the common and external iliac vein junction at the hypogastric on the left.  After stenting and ballooning we had a diameter of 12 millimeters  in the stent and venogram demonstrated flow in the lower extremity veins were previously was stagnant and no further residual stenosis in the left common and external iliac vein junction.   04/12/2018 Imaging   US Venous Doppler Right: No evidence of common femoral vein obstruction. Left: There is no evidence of deep vein thrombosis in the lower extremity. However, portions of this examination were limited- see technologist comments above. Left groin: Large hypoechoic area with mixed echoes noted measuring nearly 10 cm. Possible  hematoma versus unknown etiology. Ultrasound characteristics of enlarged lymph nodes noted in the groin.      05/15/2018 Imaging   US Venous Doppler Right: No evidence of deep vein thrombosis in the lower extremity. No indirect evidence of obstruction proximal to the inguinal ligament. Left: No reflux was noted in the common femoral vein , femoral vein in the thigh, popliteal vein, great saphenous vein at the saphenofemoral junction, great saphenous vein at the proximal thigh, great saphenous vein at the mid thigh, great saphenous vein  at the distal thigh, great saphenous vein at the knee, origin of the small saphenous vein, proximal small saphenous vein, and mid small saphenous vein. There is no evidence of deep vein thrombosis in the lower extremity. There is no evidence of superficial venous thrombosis. No cystic structure found in the popliteal fossa. Unable to evaluate extension of common femoral vein obstruction proximal to the inguinal ligament.   06/01/2018 Imaging   1. 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 Additionally, there is lucency involving the medial sidewall of the left acetabulum with potential nondisplaced pathologic fracture. Further evaluation with contrast-enhanced pelvic MRI could be performed as  clinically indicated. 2. The left pelvic arterial  and venous system is encased by this infiltrative left pelvic sidewall mass however while difficult to ascertain, the left external iliac venous stent appears patent.   06/18/2018 Pathology Results   Lymph node for lymphoma, Left Inguinal - METASTATIC ADENOCARCINOMA, SEE COMMENT. Microscopic Comment Immunohistochemistry is positive for cytokeratin 7, PAX8, ER, and PR. Cytokeratin 5/6,and p63 are negative. The immunoprofile along with the patient's history are consistent with a gynecologic primary.   06/18/2018 Surgery   Pre-op Diagnosis: INGUINAL LYMPHADENOPATHY, PELVIC MASS     Procedure(s): EXCISIONAL BIOPSY DEEP LEFT INGUINAL LYMPH NODE  Surgeon(s): Coralie Keens, MD    06/24/2018 Cancer Staging   Staging form: Corpus Uteri - Carcinoma and Carcinosarcoma, AJCC 8th Edition - Clinical: Stage IVB (cT1a, cN2, pM1) - Signed by [name], MD on 06/24/2018    Genetic Testing   Patient has genetic testing done for MMR on pathology from 06/18/2018. Results revealed patient has the following mutation(s): MMR: abnormal   06/29/2018 Procedure   Placement of a subcutaneous port device. Catheter tip at the SVC and right atrium junction.    Genetic Testing   Patient has genetic testing done for MSI on pathology from 06/18/2018. Results revealed patient has the following mutation(s): MSI: High   07/02/2018 PET scan   Previous hysterectomy, with asymmetric focus of hypermetabolic activity in the left vaginal cuff, suspicious for residual or recurrent carcinoma.  Large hypermetabolic soft tissue mass involving the left pelvic sidewall and acetabulum, consistent with metastatic disease.  No evidence metastatic disease within the abdomen, chest, or neck.   07/09/2018 Tumor Marker   Patient's tumor was tested for the following markers: CA-125 Results of the tumor marker test revealed 9   07/10/2018 - 08/24/2018 Chemotherapy   The patient had carboplatin and taxol x 3 cycles   07/17/2018  Genetic Testing   Negative genetic testing on the common hereditary cancer panel.  The Common Hereditary Gene Panel offered by [facility] includes sequencing and/or deletion duplication testing of the following 48 genes: APC, ATM, AXIN2, BARD1, BMPR1A, BRCA1, BRCA2, BRIP1, CDH1, CDK4, CDKN2A (p14ARF), CDKN2A (p16INK4a), CHEK2, CTNNA1, DICER1, EPCAM (Deletion/duplication testing only), GREM1 (promoter region deletion/duplication testing only), KIT, MEN1, MLH1, MSH2, MSH3, MSH6, MUTYH, NBN, NF1, NHTL1, PALB2, PDGFRA, PMS2, POLD1, POLE, PTEN, RAD50, RAD51C, RAD51D, RNF43, SDHB, SDHC, SDHD, SMAD4, SMARCA4. STK11, TP53, TSC1, TSC2, and VHL.  The following genes were evaluated for sequence changes only: SDHA and HOXB13 c.251G>A variant only. The report date is Jul 17, 2018.    10/03/2018 Imaging   CT abdomen and pelvis 1.  No acute intra-abdominal process. 2. Grossly unchanged left pelvic sidewall mass with osseous involvement of the medial acetabulum. Progressive mild displacement of the associated comminuted pathologic fracture involving the right acetabulum and puboacetabular junction.  3. New venous stents extending from the left common iliac vein origin to the proximal left common femoral vein. The stents are patent.   11/06/2018 -  Chemotherapy   The patient had pembrolizumab for chemotherapy treatment.     01/28/2019 Imaging   1. No substantial interval change in exam. 2. Interval development of mild fullness in the left intrarenal collecting system and ureter without overt hydronephrosis at this time. 3. Abnormal soft tissue along the left pelvic sidewall has decreased slightly in the interval. 4. Similar appearance of ill-defined fascial planes in the pelvis with some peritoneal thickening along the right pelvic sidewall and potentially involving the sigmoid mesocolon. 5. No substantial ascites.   05/03/2019  Imaging   1. Stable mild left pelvic sidewall soft tissue density. No new or progressive  disease identified within the abdomen or pelvis.  2. Colonic diverticulosis. No radiographic evidence of diverticulitis.   Aortic Atherosclerosis (ICD10-I70.0).   Metastasis to lymph nodes ([place])  06/23/2018 Initial Diagnosis   Metastasis to lymph nodes ([place])   07/10/2018 - 09/14/2018 Chemotherapy   The patient had palonosetron (ALOXI) injection 0.25 mg, 0.25 mg, Intravenous,  Once, 3 of 6 cycles Administration: 0.25 mg (07/10/2018), 0.25 mg (07/31/2018), 0.25 mg (08/24/2018) CARBOplatin (PARAPLATIN) 480 mg in sodium chloride 0.9 % 250 mL chemo infusion, 480 mg (100 % of original dose 482.5 mg), Intravenous,  Once, 3 of 6 cycles Dose modification: 482.5 mg (original dose 482.5 mg, Cycle 1) Administration: 480 mg (07/10/2018), 480 mg (07/31/2018), 480 mg (08/24/2018) PACLitaxel (TAXOL) 276 mg in sodium chloride 0.9 % 250 mL chemo infusion (> 56m/m2), 140 mg/m2 = 276 mg (80 % of original dose 175 mg/m2), Intravenous,  Once, 3 of 6 cycles Dose modification: 140 mg/m2 (80 % of original dose 175 mg/m2, Cycle 1, Reason: Dose Not Tolerated) Administration: 276 mg (07/10/2018), 276 mg (07/31/2018), 276 mg (08/24/2018) fosaprepitant (EMEND) 150 mg, dexamethasone (DECADRON) 12 mg in sodium chloride 0.9 % 145 mL IVPB, , Intravenous,  Once, 3 of 6 cycles Administration:  (07/10/2018),  (07/31/2018),  (08/24/2018)  for chemotherapy treatment.    11/06/2018 -  Chemotherapy   The patient had pembrolizumab for chemotherapy treatment.     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 ([place]  06/24/2018 Initial Diagnosis   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   07/10/2018 - 09/14/2018 Chemotherapy   The patient had palonosetron (ALOXI) injection 0.25 mg, 0.25 mg, Intravenous,  Once, 3 of 6 cycles Administration: 0.25 mg (07/10/2018), 0.25 mg (07/31/2018), 0.25 mg (08/24/2018) CARBOplatin (PARAPLATIN) 480 mg in sodium chloride 0.9 % 250 mL chemo infusion, 480 mg (100 % of original dose 482.5 mg), Intravenous,  Once, 3 of 6 cycles Dose modification: 482.5 mg (original  dose 482.5 mg, Cycle 1) Administration: 480 mg (07/10/2018), 480 mg (07/31/2018), 480 mg (08/24/2018) PACLitaxel (TAXOL) 276 mg in sodium chloride 0.9 % 250 mL chemo infusion (> 844mm2), 140 mg/m2 = 276 mg (80 % of original dose 175 mg/m2), Intravenous,  Once, 3 of 6 cycles Dose modification: 140 mg/m2 (80 % of original dose 175 mg/m2, Cycle 1, Reason: Dose Not Tolerated) Administration: 276 mg (07/10/2018), 276 mg (07/31/2018), 276 mg (08/24/2018) fosaprepitant (EMEND) 150 mg, dexamethasone (DECADRON) 12 mg in sodium chloride 0.9 % 145 mL IVPB, , Intravenous,  Once, 3 of 6 cycles Administration:  (07/10/2018),  (07/31/2018),  (08/24/2018)  for chemotherapy treatment.    11/06/2018 -  Chemotherapy   The patient had pembrolizumab for chemotherapy treatment.     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       REVIEW OF SYSTEMS:   Constitutional: Denies fevers, chills or abnormal weight loss Eyes: Denies blurriness of vision Ears, nose, mouth, throat, and face: Denies mucositis or sore throat Respiratory: Denies cough, dyspnea or wheezes Cardiovascular: Denies palpitation, chest discomfort  Gastrointestinal:  Denies nausea, heartburn or change in bowel habits Skin: Denies abnormal skin rashes Lymphatics: Denies new lymphadenopathy or easy bruising Neurological:Denies numbness, tingling or new weaknesses Behavioral/Psych: Mood is stable, no new changes  All other systems were reviewed with the  patient and are negative.  I have reviewed the past medical history, past surgical history, social history and family history with the patient and they are unchanged from previous note.  ALLERGIES:  has No Known Allergies.  MEDICATIONS:  Current Outpatient  Medications  Medication Sig Dispense Refill  . clopidogrel (PLAVIX) 75 MG tablet Take 1 tablet (75 mg total) by mouth daily with breakfast. 30 tablet 1  . diclofenac sodium (VOLTAREN) 1 % GEL APPLY 4GRAMS 4 TIMES A DAY AS NEEDED FOR PAINS    . Eliquis DVT/PE Starter Pack (ELIQUIS STARTER PACK) 5 MG TABS Take as directed on package: start with two-60m tablets twice daily for 7 days. On day 8, switch to one-528mtablet twice daily. 1 each 0  . methadone (DOLOPHINE) 10 MG tablet Take 1 tablet (10 mg total) by mouth every 12 (twelve) hours. 60 tablet 0  . morphine (MSIR) 15 MG tablet Take 1 tablet (15 mg total) by mouth every 6 (six) hours as needed for severe pain. 60 tablet 0  . Olopatadine HCl 0.2 % SOLN Place 1 drop into both eyes daily.     No current facility-administered medications for this visit.   Facility-Administered Medications Ordered in Other Visits  Medication Dose Route Frequency Provider Last Rate Last Admin  . heparin lock flush 100 unit/mL  500 Units Intracatheter Once PRN GoAlvy BimlerNi, MD      . sodium chloride flush (NS) 0.9 % injection 10 mL  10 mL Intracatheter PRN GoAlvy BimlerNi, MD        PHYSICAL EXAMINATION: ECOG PERFORMANCE STATUS: 1 - Symptomatic but completely ambulatory  Vitals:   07/09/19 1042  BP: (!) 132/56  Pulse: 75  Resp: 18  Temp: 97.9 F (36.6 C)  SpO2: 100%   Filed Weights   07/09/19 1042  Weight: 195 lb 9.6 oz (88.7 kg)    GENERAL:alert, no distress and comfortable SKIN: skin color, texture, turgor are normal, no rashes or significant lesions EYES: normal, Conjunctiva are pink and non-injected, sclera clear OROPHARYNX:no exudate, no erythema and lips, buccal mucosa, and tongue normal  NECK: supple, thyroid normal size, non-tender, without nodularity LYMPH:  no palpable lymphadenopathy in the cervical, axillary or inguinal LUNGS: clear to auscultation and percussion with normal breathing effort HEART: regular rate & rhythm and no murmurs with  chronic bilateral lower extremity edema ABDOMEN:abdomen soft, non-tender and normal bowel sounds Musculoskeletal:no cyanosis of digits and no clubbing  NEURO: alert & oriented x 3 with fluent speech, no focal motor/sensory deficits  LABORATORY DATA:  I have reviewed the data as listed    Component Value Date/Time   NA 139 07/09/2019 1014   K 3.8 07/09/2019 1014   CL 106 07/09/2019 1014   CO2 26 07/09/2019 1014   GLUCOSE 92 07/09/2019 1014   BUN 13 07/09/2019 1014   CREATININE 0.67 07/09/2019 1014   CALCIUM 9.3 07/09/2019 1014   PROT 7.1 07/09/2019 1014   ALBUMIN 3.8 07/09/2019 1014   AST 10 (L) 07/09/2019 1014   ALT 8 07/09/2019 1014   ALKPHOS 103 07/09/2019 1014   BILITOT 0.6 07/09/2019 1014   GFRNONAA >60 07/09/2019 1014   GFRAA >60 07/09/2019 1014    No results found for: SPEP, UPEP  Lab Results  Component Value Date   WBC 2.9 (L) 07/09/2019   NEUTROABS 1.7 07/09/2019   HGB 11.4 (L) 07/09/2019   HCT 35.3 (L) 07/09/2019   MCV 92.7 07/09/2019   PLT 197 07/09/2019      Chemistry  Component Value Date/Time   NA 139 07/09/2019 1014   K 3.8 07/09/2019 1014   CL 106 07/09/2019 1014   CO2 26 07/09/2019 1014   BUN 13 07/09/2019 1014   CREATININE 0.67 07/09/2019 1014      Component Value Date/Time   CALCIUM 9.3 07/09/2019 1014   ALKPHOS 103 07/09/2019 1014   AST 10 (L) 07/09/2019 1014   ALT 8 07/09/2019 1014   BILITOT 0.6 07/09/2019 1014

## 2019-07-09 NOTE — Assessment & Plan Note (Signed)
She has excellent response to treatment with her recent CT imaging from March There has been no growth in the lymph node in the pelvis She has nearly no side effects from treatment so far We will continue treatment indefinitely every 3 weeks I do not plan to repeat her CT imaging again for a few more months, probably next due in end of July

## 2019-07-30 ENCOUNTER — Inpatient Hospital Stay: Payer: Medicare Other

## 2019-07-30 ENCOUNTER — Inpatient Hospital Stay (HOSPITAL_BASED_OUTPATIENT_CLINIC_OR_DEPARTMENT_OTHER): Payer: Medicare Other | Admitting: Hematology and Oncology

## 2019-07-30 ENCOUNTER — Other Ambulatory Visit: Payer: Self-pay

## 2019-07-30 ENCOUNTER — Inpatient Hospital Stay: Payer: Medicare Other | Attending: Hematology and Oncology

## 2019-07-30 ENCOUNTER — Encounter: Payer: Self-pay | Admitting: Hematology and Oncology

## 2019-07-30 DIAGNOSIS — D61818 Other pancytopenia: Secondary | ICD-10-CM | POA: Diagnosis not present

## 2019-07-30 DIAGNOSIS — C541 Malignant neoplasm of endometrium: Secondary | ICD-10-CM | POA: Insufficient documentation

## 2019-07-30 DIAGNOSIS — C55 Malignant neoplasm of uterus, part unspecified: Secondary | ICD-10-CM | POA: Diagnosis not present

## 2019-07-30 DIAGNOSIS — I825Z2 Chronic embolism and thrombosis of unspecified deep veins of left distal lower extremity: Secondary | ICD-10-CM

## 2019-07-30 DIAGNOSIS — R946 Abnormal results of thyroid function studies: Secondary | ICD-10-CM | POA: Diagnosis not present

## 2019-07-30 DIAGNOSIS — G893 Neoplasm related pain (acute) (chronic): Secondary | ICD-10-CM | POA: Diagnosis not present

## 2019-07-30 DIAGNOSIS — C774 Secondary and unspecified malignant neoplasm of inguinal and lower limb lymph nodes: Secondary | ICD-10-CM

## 2019-07-30 DIAGNOSIS — Z7189 Other specified counseling: Secondary | ICD-10-CM

## 2019-07-30 DIAGNOSIS — C801 Malignant (primary) neoplasm, unspecified: Secondary | ICD-10-CM

## 2019-07-30 DIAGNOSIS — Z5112 Encounter for antineoplastic immunotherapy: Secondary | ICD-10-CM | POA: Diagnosis not present

## 2019-07-30 DIAGNOSIS — C7951 Secondary malignant neoplasm of bone: Secondary | ICD-10-CM

## 2019-07-30 LAB — CBC WITH DIFFERENTIAL (CANCER CENTER ONLY)
Abs Immature Granulocytes: 0 10*3/uL (ref 0.00–0.07)
Basophils Absolute: 0 10*3/uL (ref 0.0–0.1)
Basophils Relative: 0 %
Eosinophils Absolute: 0 10*3/uL (ref 0.0–0.5)
Eosinophils Relative: 1 %
HCT: 35.4 % — ABNORMAL LOW (ref 36.0–46.0)
Hemoglobin: 11.6 g/dL — ABNORMAL LOW (ref 12.0–15.0)
Immature Granulocytes: 0 %
Lymphocytes Relative: 21 %
Lymphs Abs: 0.6 10*3/uL — ABNORMAL LOW (ref 0.7–4.0)
MCH: 30.5 pg (ref 26.0–34.0)
MCHC: 32.8 g/dL (ref 30.0–36.0)
MCV: 93.2 fL (ref 80.0–100.0)
Monocytes Absolute: 0.3 10*3/uL (ref 0.1–1.0)
Monocytes Relative: 11 %
Neutro Abs: 2 10*3/uL (ref 1.7–7.7)
Neutrophils Relative %: 67 %
Platelet Count: 185 10*3/uL (ref 150–400)
RBC: 3.8 MIL/uL — ABNORMAL LOW (ref 3.87–5.11)
RDW: 13.2 % (ref 11.5–15.5)
WBC Count: 2.9 10*3/uL — ABNORMAL LOW (ref 4.0–10.5)
nRBC: 0 % (ref 0.0–0.2)

## 2019-07-30 LAB — CMP (CANCER CENTER ONLY)
ALT: 6 U/L (ref 0–44)
AST: 11 U/L — ABNORMAL LOW (ref 15–41)
Albumin: 3.8 g/dL (ref 3.5–5.0)
Alkaline Phosphatase: 102 U/L (ref 38–126)
Anion gap: 8 (ref 5–15)
BUN: 13 mg/dL (ref 8–23)
CO2: 25 mmol/L (ref 22–32)
Calcium: 9.4 mg/dL (ref 8.9–10.3)
Chloride: 106 mmol/L (ref 98–111)
Creatinine: 0.72 mg/dL (ref 0.44–1.00)
GFR, Est AFR Am: >60 mL/min (ref >60)
GFR, Estimated: >60 mL/min (ref >60)
Glucose, Bld: 97 mg/dL (ref 70–99)
Potassium: 3.7 mmol/L (ref 3.5–5.1)
Sodium: 139 mmol/L (ref 135–145)
Total Bilirubin: 0.7 mg/dL (ref 0.3–1.2)
Total Protein: 7.2 g/dL (ref 6.5–8.1)

## 2019-07-30 LAB — TSH: TSH: 2.031 u[IU]/mL (ref 0.308–3.960)

## 2019-07-30 MED ORDER — HEPARIN SOD (PORK) LOCK FLUSH 100 UNIT/ML IV SOLN
500.0000 [IU] | Freq: Once | INTRAVENOUS | Status: AC | PRN
  Administered 2019-07-30: 500 [IU]
  Filled 2019-07-30: qty 5

## 2019-07-30 MED ORDER — SODIUM CHLORIDE 0.9 % IV SOLN
Freq: Once | INTRAVENOUS | Status: AC
  Filled 2019-07-30: qty 250

## 2019-07-30 MED ORDER — SODIUM CHLORIDE 0.9 % IV SOLN
200.0000 mg | Freq: Once | INTRAVENOUS | Status: AC
  Administered 2019-07-30: 200 mg via INTRAVENOUS
  Filled 2019-07-30: qty 8

## 2019-07-30 MED ORDER — SODIUM CHLORIDE 0.9% FLUSH
10.0000 mL | INTRAVENOUS | Status: DC | PRN
  Administered 2019-07-30: 10 mL
  Filled 2019-07-30: qty 10

## 2019-07-30 NOTE — Assessment & Plan Note (Signed)
She will continue anticoagulation therapy indefinitely 

## 2019-07-30 NOTE — Assessment & Plan Note (Signed)
She was found to have iron deficiency anemia and borderline vitamin B12 deficiency After receiving intravenous iron infusion, her blood counts are stable We will continue to monitor closely She is not symptomatic We will proceed without delay 

## 2019-07-30 NOTE — Assessment & Plan Note (Signed)
She has excellent pain control She will continue current prescribed morphine sulfate and methadone as needed We discussed narcotic refill policy

## 2019-07-30 NOTE — Patient Instructions (Signed)
[facility] Discharge Instructions for Patients Receiving Chemotherapy  Today you received the following chemotherapy agents: pembrolizumab.  To help prevent nausea and vomiting after your treatment, we encourage you to take your nausea medication as directed.   If you develop nausea and vomiting that is not controlled by your nausea medication, call the clinic.   BELOW ARE SYMPTOMS THAT SHOULD BE REPORTED IMMEDIATELY:  *FEVER GREATER THAN 100.5 F  *CHILLS WITH OR WITHOUT FEVER  NAUSEA AND VOMITING THAT IS NOT CONTROLLED WITH YOUR NAUSEA MEDICATION  *UNUSUAL SHORTNESS OF BREATH  *UNUSUAL BRUISING OR BLEEDING  TENDERNESS IN MOUTH AND THROAT WITH OR WITHOUT PRESENCE OF ULCERS  *URINARY PROBLEMS  *BOWEL PROBLEMS  UNUSUAL RASH Items with * indicate a potential emergency and should be followed up as soon as possible.  Feel free to call the clinic should you have any questions or concerns. The clinic phone number is [phone].  Please show the CHEMO ALERT CARD at check-in to the Emergency Department and triage nurse.   

## 2019-07-30 NOTE — Patient Instructions (Signed)

## 2019-07-30 NOTE — Progress Notes (Signed)
[facility] OFFICE PROGRESS NOTE  Patient Care Team: Nolene Ebbs, MD as PCP - General (Internal Medicine)  ASSESSMENT & PLAN:  Uterine cancer [facility] [place]) She has excellent response to treatment with her recent CT imaging from March There has been no growth in the lymph node in the pelvis She has nearly no side effects from treatment so far We will continue treatment indefinitely every 3 weeks I do not plan to repeat her CT imaging again at the end of July  Pancytopenia, acquired [facility]) She was found to have iron deficiency anemia and borderline vitamin B12 deficiency After receiving intravenous iron infusion, her blood counts are stable We will continue to monitor closely She is not symptomatic We will proceed without delay  Lower leg DVT (deep venous thromboembolism), chronic, left ([place]) She will continue anticoagulation therapy indefinitely  Cancer associated pain She has excellent pain control She will continue current prescribed morphine sulfate and methadone as needed We discussed narcotic refill policy   No orders of the defined types were placed in this encounter.   All questions were answered. The patient knows to call the clinic with any problems, questions or concerns. The total time spent in the appointment was 20 minutes encounter with patients including review of chart and various tests results, discussions about plan of care and coordination of care plan   [name], MD 07/30/2019 1:22 PM  INTERVAL HISTORY: Please see below for problem oriented charting. 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 Uterus +/- tubes/ovaries, neoplastic, with left fallopian tube and ovary - INVASIVE ENDOMETRIOID CARCINOMA (1.5 CM), FIGO GRADE II, ARISING IN A BACKGROUND OF ATYPICAL COMPLEX HYPERPLASIA, CONFINED WITHIN INNER HALF OF THE MYOMETRIUM. - ENDOMETRIAL POLYP WITH ASSOCIATED ATYPICAL COMPLEX HYPERPLASIA. - MYOMETRIUM: LEIOMYOMATA. - CERVIX: BENIGN SQUAMOUS MUCOSA AND ENDOCERVICAL MUCOSA, NO DYSPLASIA OR MALIGNANCY. - LEFT OVARY: BENIGN OVARIAN TISSUE WITH ENDOSALPINGOSIS, NO EVIDENCE OF ATYPIA OR MALIGNANCY. - LEFT FALLOPIAN TUBE: NO HISTOLOGIC ABNORMALITIES. - PLEASE SEE ONCOLOGY TEMPLATE FOR DETAIL. 2. Ovary and fallopian tube, right - BENIGN OVARIAN TISSUE WITH ENDOSALPINGOSIS, NO ATYPIA OR MALIGNANCY. - BENIGN FALLOPIAN TUBAL TISSUE, NO PATHOLOGIC ABNORMALITIES. Microscopic Comment 1. UTERUS Specimen: Uterus, cervix, bilateral ovaries and fallopian tubes Procedure: Total hysterectomy and bilateral salpingo-oophorectomy Lymph node sampling performed: No Specimen integrity: Intact Maximum tumor size (cm): 1.5 cm, glass slide measurement Histologic type: Invasive endometrioid carcinoma Grade: FIGO grade II Myometrial invasion: 1 cm where myometrium is 2.3 cm in thickness Cervical stromal involvement: No Extent of involvement of other organs: No Lymph vascular invasion: Not identified Peritoneal washings: Negative ([id number]) Lymph nodes: number examined N/A; number positive N/A TNM code: pT1a, pNX 1 oFf 3IGO Stage (based on pathologic findings, needs clinical correlation): IA  Comments: Sections the endomyometrium away from the grossly identified endometrial polyp show an invasive FIGO grade II endometrioid carcinoma. The tumor is confined within inner half of the myometrium. No angiolymphatic invasion is identified. No cervical stromal involvement is identified. Sections of the grossly identified endometrial polyp show an  endometrial polyp with associated atypical compacted hyperplasia with no definitive evidence of carcinoma.   12/07/2017 Imaging  US venous Doppler Right: No evidence of common femoral vein obstruction. Left: Findings consistent with acute deep vein thrombosis involving the left femoral vein, left proximal profunda vein, and left popliteal vein. Unable to adequately interrogate the common femoral and higher, or the calf secondary to significant edema and body habitus   12/07/2017 [facility] Admission   She presented to the ER and was diagnosed with acute DVT   01/18/2018 - 01/21/2018 [facility] Admission   She was admitted to [facility] for management of severe persistent DVT   01/18/2018 Imaging   US venous Doppler Right: No evidence of common femoral vein obstruction. Left: Findings consistent with acute deep vein thrombosis involving the left common femoral vein, and left popliteal vein.   01/19/2018 Surgery   Pre-operative Diagnosis: Subacute DVT with severe post thrombotic syndrome Post-operative diagnosis:  Same Surgeon:  Erlene Quan C. Donzetta Matters, MD Procedure Performed: 1.  Ultrasound-guided cannulation left small saphenous vein 2.  Left lower extremity and central venography 3.  Intravascular ultrasound of left popliteal, femoral, common femoral, external and common iliac veins and IVC 4.  Stent of left common and external iliac veins with 14 x 60 mm Vici 5.  Moderate sedation with fentanyl and Versed for 50 minutes  Indications: 74 year old female with a history of DVT in October now presents with persistent left lower extremity swelling and ultrasound demonstrating likely persistent DVT.  She has been on Xarelto at this time.  She is now indicated for venogram possible intervention.  Findings: Flow in the left lower extremity was stagnant throughout but by venogram all veins were patent.  There was a focal occlusive area approximately 2 cm in length at the common and external iliac  vein junction at the hypogastric on the left.  After stenting and ballooning we had a diameter of 12 millimeters in the stent and venogram demonstrated flow in the lower extremity veins were previously was stagnant and no further residual stenosis in the left common and external iliac vein junction.   04/12/2018 Imaging   US Venous Doppler Right: No evidence of common femoral vein obstruction. Left: There is no evidence of deep vein thrombosis in the lower extremity. However, portions of this examination were limited- see technologist comments above. Left groin: Large hypoechoic area with mixed echoes noted measuring nearly 10 cm. Possible  hematoma versus unknown etiology. Ultrasound characteristics of enlarged lymph nodes noted in the groin.      05/15/2018 Imaging   US Venous Doppler Right: No evidence of deep vein thrombosis in the lower extremity. No indirect evidence of obstruction proximal to the inguinal ligament. Left: No reflux was noted in the common femoral vein , femoral vein in the thigh, popliteal vein, great saphenous vein at the saphenofemoral junction, great saphenous vein at the proximal thigh, great saphenous vein at the mid thigh, great saphenous vein  at the distal thigh, great saphenous vein at the knee, origin of the small saphenous vein, proximal small saphenous vein, and mid small saphenous vein. There is no evidence of deep vein thrombosis in the lower extremity. There is no evidence of superficial venous thrombosis. No cystic structure found in the popliteal fossa. Unable to evaluate extension of common femoral vein obstruction proximal to the inguinal ligament.   06/01/2018 Imaging   1. 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 Additionally, there is lucency involving the medial sidewall of the left acetabulum with potential nondisplaced pathologic fracture.  Further  evaluation with contrast-enhanced pelvic MRI could be performed as clinically indicated. 2. The left pelvic arterial and venous system is encased by this infiltrative left pelvic sidewall mass however while difficult to ascertain, the left external iliac venous stent appears patent.   06/18/2018 Pathology Results   Lymph node for lymphoma, Left Inguinal - METASTATIC ADENOCARCINOMA, SEE COMMENT. Microscopic Comment Immunohistochemistry is positive for cytokeratin 7, PAX8, ER, and PR. Cytokeratin 5/6,and p63 are negative. The immunoprofile along with the patient's history are consistent with a gynecologic primary.   06/18/2018 Surgery   Pre-op Diagnosis: INGUINAL LYMPHADENOPATHY, PELVIC MASS     Procedure(s): EXCISIONAL BIOPSY DEEP LEFT INGUINAL LYMPH NODE  Surgeon(s): Coralie Keens, MD    06/24/2018 Cancer Staging   Staging form: Corpus Uteri - Carcinoma and Carcinosarcoma, AJCC 8th Edition - Clinical: Stage IVB (cT1a, cN2, pM1) - Signed by [name], MD on 06/24/2018    Genetic Testing   Patient has genetic testing done for MMR on pathology from 06/18/2018. Results revealed patient has the following mutation(s): MMR: abnormal   06/29/2018 Procedure   Placement of a subcutaneous port device. Catheter tip at the SVC and right atrium junction.    Genetic Testing   Patient has genetic testing done for MSI on pathology from 06/18/2018. Results revealed patient has the following mutation(s): MSI: High   07/02/2018 PET scan   Previous hysterectomy, with asymmetric focus of hypermetabolic activity in the left vaginal cuff, suspicious for residual or recurrent carcinoma.  Large hypermetabolic soft tissue mass involving the left pelvic sidewall and acetabulum, consistent with metastatic disease.  No evidence metastatic disease within the abdomen, chest, or neck.   07/09/2018 Tumor Marker   Patient's tumor was tested for the following markers: CA-125 Results of the tumor marker  test revealed 9   07/10/2018 - 08/24/2018 Chemotherapy   The patient had carboplatin and taxol x 3 cycles   07/17/2018 Genetic Testing   Negative genetic testing on the common hereditary cancer panel.  The Common Hereditary Gene Panel offered by [facility] includes sequencing and/or deletion duplication testing of the following 48 genes: APC, ATM, AXIN2, BARD1, BMPR1A, BRCA1, BRCA2, BRIP1, CDH1, CDK4, CDKN2A (p14ARF), CDKN2A (p16INK4a), CHEK2, CTNNA1, DICER1, EPCAM (Deletion/duplication testing only), GREM1 (promoter region deletion/duplication testing only), Leslie, MEN1, MLH1, MSH2, MSH3, MSH6, MUTYH, NBN, NF1, NHTL1, PALB2, PDGFRA, PMS2, POLD1, POLE, PTEN, RAD50, RAD51C, RAD51D, RNF43, SDHB, SDHC, SDHD, SMAD4, SMARCA4. STK11, TP53, TSC1, TSC2, and VHL.  The following genes were evaluated for sequence changes only: SDHA and HOXB13 c.251G>A variant only. The report date is Jul 17, 2018.    10/03/2018 Imaging   CT abdomen and pelvis 1.  No acute intra-abdominal process. 2. Grossly unchanged left pelvic sidewall mass with osseous involvement of the medial acetabulum. Progressive mild displacement of the associated comminuted pathologic fracture involving the right acetabulum and puboacetabular junction.  3. New venous stents extending from the left common iliac vein origin to the proximal left common femoral vein. The stents are patent.   11/06/2018 -  Chemotherapy   The patient had pembrolizumab for chemotherapy treatment.     01/28/2019 Imaging   1. No substantial interval change in exam. 2. Interval development of mild fullness in the left intrarenal collecting system and ureter without overt hydronephrosis at this time. 3. Abnormal soft tissue along the left pelvic sidewall has decreased slightly in the interval. 4. Similar appearance of ill-defined fascial planes in the pelvis with some peritoneal thickening along the right pelvic sidewall and potentially involving  the sigmoid mesocolon. 5. No  substantial ascites.   05/03/2019 Imaging   1. Stable mild left pelvic sidewall soft tissue density. No new or progressive disease identified within the abdomen or pelvis.  2. Colonic diverticulosis. No radiographic evidence of diverticulitis.   Aortic Atherosclerosis (ICD10-I70.0).   Metastasis to lymph nodes ([place])  06/23/2018 Initial Diagnosis   Metastasis to lymph nodes ([place])   07/10/2018 - 09/14/2018 Chemotherapy   The patient had palonosetron (ALOXI) injection 0.25 mg, 0.25 mg, Intravenous,  Once, 3 of 6 cycles Administration: 0.25 mg (07/10/2018), 0.25 mg (07/31/2018), 0.25 mg (08/24/2018) CARBOplatin (PARAPLATIN) 480 mg in sodium chloride 0.9 % 250 mL chemo infusion, 480 mg (100 % of original dose 482.5 mg), Intravenous,  Once, 3 of 6 cycles Dose modification: 482.5 mg (original dose 482.5 mg, Cycle 1) Administration: 480 mg (07/10/2018), 480 mg (07/31/2018), 480 mg (08/24/2018) PACLitaxel (TAXOL) 276 mg in sodium chloride 0.9 % 250 mL chemo infusion (> 71m/m2), 140 mg/m2 = 276 mg (80 % of original dose 175 mg/m2), Intravenous,  Once, 3 of 6 cycles Dose modification: 140 mg/m2 (80 % of original dose 175 mg/m2, Cycle 1, Reason: Dose Not Tolerated) Administration: 276 mg (07/10/2018), 276 mg (07/31/2018), 276 mg (08/24/2018) fosaprepitant (EMEND) 150 mg, dexamethasone (DECADRON) 12 mg in sodium chloride 0.9 % 145 mL IVPB, , Intravenous,  Once, 3 of 6 cycles Administration:  (07/10/2018),  (07/31/2018),  (08/24/2018)  for chemotherapy treatment.    11/06/2018 -  Chemotherapy   The patient had pembrolizumab for chemotherapy treatment.     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 ([place]  06/24/2018 Initial Diagnosis   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 ([place]   07/10/2018 - 09/14/2018 Chemotherapy   The patient had palonosetron (ALOXI) injection 0.25 mg, 0.25 mg, Intravenous,  Once, 3 of 6 cycles Administration: 0.25 mg (07/10/2018), 0.25 mg (07/31/2018), 0.25 mg (08/24/2018) CARBOplatin (PARAPLATIN) 480 mg in sodium chloride 0.9 % 250 mL chemo  infusion, 480 mg (100 % of original dose 482.5 mg), Intravenous,  Once, 3 of 6 cycles Dose modification: 482.5 mg (original dose 482.5 mg, Cycle 1) Administration: 480 mg (07/10/2018), 480 mg (07/31/2018), 480 mg (08/24/2018) PACLitaxel (TAXOL) 276 mg in sodium chloride 0.9 % 250 mL chemo infusion (> 890mm2), 140 mg/m2 = 276 mg (80 % of original dose 175 mg/m2), Intravenous,  Once, 3 of 6 cycles Dose modification: 140 mg/m2 (80 % of original dose 175 mg/m2, Cycle 1, Reason: Dose Not Tolerated) Administration: 276 mg (07/10/2018), 276 mg (07/31/2018), 276 mg (08/24/2018) fosaprepitant (EMEND) 150 mg, dexamethasone (DECADRON) 12 mg in sodium chloride 0.9 % 145 mL IVPB, , Intravenous,  Once, 3 of 6 cycles Administration:  (07/10/2018),  (07/31/2018),  (08/24/2018)  for chemotherapy treatment.    11/06/2018 -  Chemotherapy   The patient had pembrolizumab for chemotherapy treatment.     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       REVIEW OF SYSTEMS:   Constitutional: Denies fevers, chills or abnormal weight loss Eyes: Denies blurriness of vision Ears, nose, mouth, throat, and face: Denies mucositis or sore throat Respiratory: Denies cough, dyspnea or wheezes Cardiovascular: Denies palpitation, chest discomfort or lower extremity swelling Gastrointestinal:  Denies nausea, heartburn or change in bowel habits Skin: Denies abnormal skin rashes Lymphatics: Denies new lymphadenopathy or easy bruising Neurological:Denies numbness, tingling or new weaknesses Behavioral/Psych: Mood  is stable, no new changes  All other systems were reviewed with the patient and are negative.  I have reviewed the past medical history, past surgical history, social history and  family history with the patient and they are unchanged from previous note.  ALLERGIES:  has No Known Allergies.  MEDICATIONS:  Current Outpatient Medications  Medication Sig Dispense Refill  . clopidogrel (PLAVIX) 75 MG tablet Take 1 tablet (75 mg total) by mouth daily with breakfast. 30 tablet 1  . diclofenac sodium (VOLTAREN) 1 % GEL APPLY 4GRAMS 4 TIMES A DAY AS NEEDED FOR PAINS    . Eliquis DVT/PE Starter Pack (ELIQUIS STARTER PACK) 5 MG TABS Take as directed on package: start with two-31m tablets twice daily for 7 days. On day 8, switch to one-552mtablet twice daily. 1 each 0  . methadone (DOLOPHINE) 10 MG tablet Take 1 tablet (10 mg total) by mouth every 12 (twelve) hours. 60 tablet 0  . morphine (MSIR) 15 MG tablet Take 1 tablet (15 mg total) by mouth every 6 (six) hours as needed for severe pain. 60 tablet 0  . Olopatadine HCl 0.2 % SOLN Place 1 drop into both eyes daily.     No current facility-administered medications for this visit.   Facility-Administered Medications Ordered in Other Visits  Medication Dose Route Frequency Provider Last Rate Last Admin  . sodium chloride flush (NS) 0.9 % injection 10 mL  10 mL Intracatheter PRN GoAlvy BimlerNi, MD   10 mL at 07/30/19 1136    PHYSICAL EXAMINATION: ECOG PERFORMANCE STATUS: 2 - Symptomatic, <50% confined to bed  Vitals:   07/30/19 1012  BP: (!) 147/77  Pulse: 87  Resp: 20  Temp: 98.4 F (36.9 C)  SpO2: 100%   Filed Weights   07/30/19 1012  Weight: 201 lb 3.2 oz (91.3 kg)    GENERAL:alert, no distress and comfortable SKIN: skin color, texture, turgor are normal, no rashes or significant lesions EYES: normal, Conjunctiva are pink and non-injected, sclera clear OROPHARYNX:no exudate, no erythema and lips, buccal mucosa, and tongue normal  NECK: supple, thyroid normal size, non-tender, without nodularity LYMPH:  no palpable lymphadenopathy in the cervical, axillary or inguinal LUNGS: clear to auscultation and percussion  with normal breathing effort HEART: regular rate & rhythm and no murmurs with mild bilateral lower extremity edema ABDOMEN:abdomen soft, non-tender and normal bowel sounds Musculoskeletal:no cyanosis of digits and no clubbing  NEURO: alert & oriented x 3 with fluent speech, no focal motor/sensory deficits  LABORATORY DATA:  I have reviewed the data as listed    Component Value Date/Time   NA 139 07/30/2019 0950   K 3.7 07/30/2019 0950   CL 106 07/30/2019 0950   CO2 25 07/30/2019 0950   GLUCOSE 97 07/30/2019 0950   BUN 13 07/30/2019 0950   CREATININE 0.72 07/30/2019 0950   CALCIUM 9.4 07/30/2019 0950   PROT 7.2 07/30/2019 0950   ALBUMIN 3.8 07/30/2019 0950   AST 11 (L) 07/30/2019 0950   ALT 6 07/30/2019 0950   ALKPHOS 102 07/30/2019 0950   BILITOT 0.7 07/30/2019 0950   GFRNONAA >60 07/30/2019 0950   GFRAA >60 07/30/2019 0950    No results found for: SPEP, UPEP  Lab Results  Component Value Date   WBC 2.9 (L) 07/30/2019   NEUTROABS 2.0 07/30/2019   HGB 11.6 (L) 07/30/2019   HCT 35.4 (L) 07/30/2019   MCV 93.2 07/30/2019   PLT 185 07/30/2019      Chemistry  Component Value Date/Time   NA 139 07/30/2019 0950   K 3.7 07/30/2019 0950   CL 106 07/30/2019 0950   CO2 25 07/30/2019 0950   BUN 13 07/30/2019 0950   CREATININE 0.72 07/30/2019 0950      Component Value Date/Time   CALCIUM 9.4 07/30/2019 0950   ALKPHOS 102 07/30/2019 0950   AST 11 (L) 07/30/2019 0950   ALT 6 07/30/2019 0950   BILITOT 0.7 07/30/2019 0950

## 2019-07-30 NOTE — Assessment & Plan Note (Signed)
She has excellent response to treatment with her recent CT imaging from March There has been no growth in the lymph node in the pelvis She has nearly no side effects from treatment so far We will continue treatment indefinitely every 3 weeks I do not plan to repeat her CT imaging again at the end of July

## 2019-08-20 ENCOUNTER — Telehealth: Payer: Self-pay | Admitting: Hematology and Oncology

## 2019-08-20 ENCOUNTER — Other Ambulatory Visit: Payer: Self-pay

## 2019-08-20 ENCOUNTER — Inpatient Hospital Stay: Payer: Medicare Other

## 2019-08-20 ENCOUNTER — Other Ambulatory Visit: Payer: Self-pay | Admitting: Hematology and Oncology

## 2019-08-20 ENCOUNTER — Encounter: Payer: Self-pay | Admitting: Hematology and Oncology

## 2019-08-20 ENCOUNTER — Inpatient Hospital Stay (HOSPITAL_BASED_OUTPATIENT_CLINIC_OR_DEPARTMENT_OTHER): Payer: Medicare Other | Admitting: Hematology and Oncology

## 2019-08-20 ENCOUNTER — Inpatient Hospital Stay: Payer: Medicare Other | Attending: Hematology and Oncology

## 2019-08-20 VITALS — BP 114/53 | HR 73 | Temp 97.5°F | Resp 18 | Ht 61.0 in | Wt 207.7 lb

## 2019-08-20 DIAGNOSIS — Z5112 Encounter for antineoplastic immunotherapy: Secondary | ICD-10-CM | POA: Insufficient documentation

## 2019-08-20 DIAGNOSIS — G893 Neoplasm related pain (acute) (chronic): Secondary | ICD-10-CM | POA: Insufficient documentation

## 2019-08-20 DIAGNOSIS — I700 Atherosclerosis of aorta: Secondary | ICD-10-CM | POA: Insufficient documentation

## 2019-08-20 DIAGNOSIS — C55 Malignant neoplasm of uterus, part unspecified: Secondary | ICD-10-CM | POA: Diagnosis not present

## 2019-08-20 DIAGNOSIS — I825Z2 Chronic embolism and thrombosis of unspecified deep veins of left distal lower extremity: Secondary | ICD-10-CM | POA: Diagnosis not present

## 2019-08-20 DIAGNOSIS — C779 Secondary and unspecified malignant neoplasm of lymph node, unspecified: Secondary | ICD-10-CM | POA: Diagnosis not present

## 2019-08-20 DIAGNOSIS — D61818 Other pancytopenia: Secondary | ICD-10-CM | POA: Diagnosis not present

## 2019-08-20 DIAGNOSIS — Z86718 Personal history of other venous thrombosis and embolism: Secondary | ICD-10-CM | POA: Diagnosis not present

## 2019-08-20 DIAGNOSIS — C774 Secondary and unspecified malignant neoplasm of inguinal and lower limb lymph nodes: Secondary | ICD-10-CM

## 2019-08-20 DIAGNOSIS — Z7189 Other specified counseling: Secondary | ICD-10-CM

## 2019-08-20 DIAGNOSIS — Z79899 Other long term (current) drug therapy: Secondary | ICD-10-CM | POA: Diagnosis not present

## 2019-08-20 DIAGNOSIS — C801 Malignant (primary) neoplasm, unspecified: Secondary | ICD-10-CM

## 2019-08-20 DIAGNOSIS — C7951 Secondary malignant neoplasm of bone: Secondary | ICD-10-CM

## 2019-08-20 DIAGNOSIS — R5381 Other malaise: Secondary | ICD-10-CM | POA: Diagnosis not present

## 2019-08-20 DIAGNOSIS — Z9221 Personal history of antineoplastic chemotherapy: Secondary | ICD-10-CM | POA: Diagnosis not present

## 2019-08-20 DIAGNOSIS — M7989 Other specified soft tissue disorders: Secondary | ICD-10-CM | POA: Insufficient documentation

## 2019-08-20 DIAGNOSIS — C541 Malignant neoplasm of endometrium: Secondary | ICD-10-CM | POA: Insufficient documentation

## 2019-08-20 DIAGNOSIS — Z7901 Long term (current) use of anticoagulants: Secondary | ICD-10-CM | POA: Insufficient documentation

## 2019-08-20 LAB — CMP (CANCER CENTER ONLY)
ALT: 9 U/L (ref 0–44)
AST: 11 U/L — ABNORMAL LOW (ref 15–41)
Albumin: 3.8 g/dL (ref 3.5–5.0)
Alkaline Phosphatase: 108 U/L (ref 38–126)
Anion gap: 8 (ref 5–15)
BUN: 14 mg/dL (ref 8–23)
CO2: 24 mmol/L (ref 22–32)
Calcium: 9.1 mg/dL (ref 8.9–10.3)
Chloride: 106 mmol/L (ref 98–111)
Creatinine: 0.77 mg/dL (ref 0.44–1.00)
GFR, Est AFR Am: >60 mL/min (ref >60)
GFR, Estimated: >60 mL/min (ref >60)
Glucose, Bld: 94 mg/dL (ref 70–99)
Potassium: 3.7 mmol/L (ref 3.5–5.1)
Sodium: 138 mmol/L (ref 135–145)
Total Bilirubin: 0.8 mg/dL (ref 0.3–1.2)
Total Protein: 7.2 g/dL (ref 6.5–8.1)

## 2019-08-20 LAB — CBC WITH DIFFERENTIAL (CANCER CENTER ONLY)
Abs Immature Granulocytes: 0 10*3/uL (ref 0.00–0.07)
Basophils Absolute: 0 10*3/uL (ref 0.0–0.1)
Basophils Relative: 1 %
Eosinophils Absolute: 0.1 10*3/uL (ref 0.0–0.5)
Eosinophils Relative: 2 %
HCT: 35.1 % — ABNORMAL LOW (ref 36.0–46.0)
Hemoglobin: 11.5 g/dL — ABNORMAL LOW (ref 12.0–15.0)
Immature Granulocytes: 0 %
Lymphocytes Relative: 25 %
Lymphs Abs: 0.7 10*3/uL (ref 0.7–4.0)
MCH: 30.8 pg (ref 26.0–34.0)
MCHC: 32.8 g/dL (ref 30.0–36.0)
MCV: 94.1 fL (ref 80.0–100.0)
Monocytes Absolute: 0.3 10*3/uL (ref 0.1–1.0)
Monocytes Relative: 12 %
Neutro Abs: 1.8 10*3/uL (ref 1.7–7.7)
Neutrophils Relative %: 60 %
Platelet Count: 180 10*3/uL (ref 150–400)
RBC: 3.73 MIL/uL — ABNORMAL LOW (ref 3.87–5.11)
RDW: 13.2 % (ref 11.5–15.5)
WBC Count: 2.9 10*3/uL — ABNORMAL LOW (ref 4.0–10.5)
nRBC: 0 % (ref 0.0–0.2)

## 2019-08-20 LAB — TSH: TSH: 1.792 u[IU]/mL (ref 0.308–3.960)

## 2019-08-20 MED ORDER — SODIUM CHLORIDE 0.9 % IV SOLN
Freq: Once | INTRAVENOUS | Status: AC
  Filled 2019-08-20: qty 250

## 2019-08-20 MED ORDER — SODIUM CHLORIDE 0.9 % IV SOLN
200.0000 mg | Freq: Once | INTRAVENOUS | Status: AC
  Administered 2019-08-20: 200 mg via INTRAVENOUS
  Filled 2019-08-20: qty 8

## 2019-08-20 MED ORDER — HEPARIN SOD (PORK) LOCK FLUSH 100 UNIT/ML IV SOLN
500.0000 [IU] | Freq: Once | INTRAVENOUS | Status: AC | PRN
  Administered 2019-08-20: 500 [IU]
  Filled 2019-08-20: qty 5

## 2019-08-20 MED ORDER — SODIUM CHLORIDE 0.9% FLUSH
10.0000 mL | INTRAVENOUS | Status: DC | PRN
  Administered 2019-08-20: 10 mL
  Filled 2019-08-20: qty 10

## 2019-08-20 NOTE — Progress Notes (Signed)
[place] OFFICE PROGRESS NOTE  Patient Care Team: Nolene Ebbs, MD as PCP - General (Internal Medicine)  ASSESSMENT & PLAN:  Uterine cancer [facility]) She has excellent response to treatment with her recent CT imaging from March There has been no growth in the lymph node in the pelvis She has nearly no side effects from treatment so far We will continue treatment indefinitely every 3 weeks I plan to repeat imaging study in 3 weeks for objective assessment of response to therapy  Pancytopenia, acquired ([place]) She was found to have iron deficiency anemia and borderline vitamin B12 deficiency After receiving intravenous iron infusion, her blood counts are stable We will continue to monitor closely She is not symptomatic We will proceed without delay  Lower leg DVT (deep venous thromboembolism), chronic, left ([place]) She will continue anticoagulation therapy indefinitely  Cancer associated pain She has excellent pain control She will continue current prescribed morphine sulfate and methadone as needed We discussed narcotic refill policy   Orders Placed This Encounter  Procedures  . CT ABDOMEN PELVIS W CONTRAST    Standing Status:   Future    Standing Expiration Date:   08/19/2020    Order Specific Question:   If indicated for the ordered procedure, I authorize the administration of contrast media per Radiology protocol    Answer:   Yes    Order Specific Question:   Preferred imaging location?    Answer:   [facility]    Order Specific Question:   Radiology Contrast Protocol - do NOT remove file path    Answer:   [email or web address]    All questions were answered. The patient knows to call the clinic with any problems, questions or concerns. The total time spent in the appointment was 20 minutes encounter with patients including review of chart and various tests results, discussions about plan of care and coordination of care plan    [name], MD 08/20/2019 10:22 AM  INTERVAL HISTORY: Please see below for problem oriented charting. She returns for treatment and follow-up She complains of intermittent leg swelling She denies pain She takes sporadic pain medicine No infusion reaction The patient denies any recent signs or symptoms of bleeding such as spontaneous epistaxis, hematuria or hematochezia.   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 Uterus +/- tubes/ovaries, neoplastic, with left fallopian tube and ovary - INVASIVE ENDOMETRIOID CARCINOMA (1.5 CM), FIGO GRADE II, ARISING IN A BACKGROUND OF ATYPICAL COMPLEX HYPERPLASIA, CONFINED WITHIN INNER HALF OF THE MYOMETRIUM. - ENDOMETRIAL POLYP WITH ASSOCIATED ATYPICAL COMPLEX HYPERPLASIA. - MYOMETRIUM: LEIOMYOMATA. - CERVIX: BENIGN SQUAMOUS MUCOSA AND ENDOCERVICAL MUCOSA, NO DYSPLASIA OR MALIGNANCY. - LEFT OVARY: BENIGN OVARIAN TISSUE WITH ENDOSALPINGOSIS, NO EVIDENCE OF ATYPIA OR MALIGNANCY. - LEFT FALLOPIAN TUBE: NO HISTOLOGIC ABNORMALITIES. - PLEASE SEE ONCOLOGY TEMPLATE FOR DETAIL. 2. Ovary and fallopian tube, right - BENIGN OVARIAN TISSUE WITH ENDOSALPINGOSIS, NO ATYPIA OR MALIGNANCY. - BENIGN FALLOPIAN TUBAL TISSUE, NO PATHOLOGIC ABNORMALITIES. Microscopic Comment 1. UTERUS Specimen: Uterus, cervix, bilateral ovaries and fallopian tubes Procedure: Total hysterectomy and bilateral salpingo-oophorectomy Lymph node sampling performed: No Specimen integrity: Intact Maximum tumor size (cm): 1.5 cm, glass slide measurement Histologic type: Invasive endometrioid carcinoma Grade: FIGO grade II Myometrial invasion: 1 cm where myometrium is 2.3 cm in thickness Cervical stromal involvement: No Extent  of involvement of other organs: No Lymph vascular  invasion: Not identified Peritoneal washings: Negative ([id number]) Lymph nodes: number examined N/A; number positive N/A TNM code: pT1a, pNX 1 oFf 3IGO Stage (based on pathologic findings, needs clinical correlation): IA  Comments: Sections the endomyometrium away from the grossly identified endometrial polyp show an invasive FIGO grade II endometrioid carcinoma. The tumor is confined within inner half of the myometrium. No angiolymphatic invasion is identified. No cervical stromal involvement is identified. Sections of the grossly identified endometrial polyp show an endometrial polyp with associated atypical compacted hyperplasia with no definitive evidence of carcinoma.   12/07/2017 Imaging   US venous Doppler Right: No evidence of common femoral vein obstruction. Left: Findings consistent with acute deep vein thrombosis involving the left femoral vein, left proximal profunda vein, and left popliteal vein. Unable to adequately interrogate the common femoral and higher, or the calf secondary to significant edema and body habitus   12/07/2017 [facility] Admission   She presented to the ER and was diagnosed with acute DVT   01/18/2018 - 01/21/2018 Hospital Admission   She was admitted to the hospital for management of severe persistent DVT   01/18/2018 Imaging   US venous Doppler Right: No evidence of common femoral vein obstruction. Left: Findings consistent with acute deep vein thrombosis involving the left common femoral vein, and left popliteal vein.   01/19/2018 Surgery   Pre-operative Diagnosis: Subacute DVT with severe post thrombotic syndrome Post-operative diagnosis:  Same Surgeon:  Erlene Quan C. Donzetta Matters, MD Procedure Performed: 1.  Ultrasound-guided cannulation left small saphenous vein 2.  Left lower extremity and central venography 3.  Intravascular ultrasound of left popliteal, femoral, common femoral, external and common iliac veins and IVC 4.  Stent of left common and external  iliac veins with 14 x 60 mm Vici 5.  Moderate sedation with fentanyl and Versed for 50 minutes  Indications: 74 year old female with a history of DVT in October now presents with persistent left lower extremity swelling and ultrasound demonstrating likely persistent DVT.  She has been on Xarelto at this time.  She is now indicated for venogram possible intervention.  Findings: Flow in the left lower extremity was stagnant throughout but by venogram all veins were patent.  There was a focal occlusive area approximately 2 cm in length at the common and external iliac vein junction at the hypogastric on the left.  After stenting and ballooning we had a diameter of 12 millimeters in the stent and venogram demonstrated flow in the lower extremity veins were previously was stagnant and no further residual stenosis in the left common and external iliac vein junction.   04/12/2018 Imaging   US Venous Doppler Right: No evidence of common femoral vein obstruction. Left: There is no evidence of deep vein thrombosis in the lower extremity. However, portions of this examination were limited- see technologist comments above. Left groin: Large hypoechoic area with mixed echoes noted measuring nearly 10 cm. Possible  hematoma versus unknown etiology. Ultrasound characteristics of enlarged lymph nodes noted in the groin.      05/15/2018 Imaging   US Venous Doppler Right: No evidence of deep vein thrombosis in the lower extremity. No indirect evidence of obstruction proximal to the inguinal ligament. Left: No reflux was noted in the common femoral vein , femoral vein in the thigh, popliteal vein, great saphenous vein at the saphenofemoral junction, great saphenous vein at the proximal thigh, great saphenous vein at the mid thigh, great saphenous vein  at  the distal thigh, great saphenous vein at the knee, origin of the small saphenous vein, proximal small saphenous vein, and mid small saphenous vein. There is no  evidence of deep vein thrombosis in the lower extremity. There is no evidence of superficial venous thrombosis. No cystic structure found in the popliteal fossa. Unable to evaluate extension of common femoral vein obstruction proximal to the inguinal ligament.   06/01/2018 Imaging   1. 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 Additionally, there is lucency involving the medial sidewall of the left acetabulum with potential nondisplaced pathologic fracture. Further evaluation with contrast-enhanced pelvic MRI could be performed as clinically indicated. 2. The left pelvic arterial and venous system is encased by this infiltrative left pelvic sidewall mass however while difficult to ascertain, the left external iliac venous stent appears patent.   06/18/2018 Pathology Results   Lymph node for lymphoma, Left Inguinal - METASTATIC ADENOCARCINOMA, SEE COMMENT. Microscopic Comment Immunohistochemistry is positive for cytokeratin 7, PAX8, ER, and PR. Cytokeratin 5/6,and p63 are negative. The immunoprofile along with the patient's history are consistent with a gynecologic primary.   06/18/2018 Surgery   Pre-op Diagnosis: INGUINAL LYMPHADENOPATHY, PELVIC MASS     Procedure(s): EXCISIONAL BIOPSY DEEP LEFT INGUINAL LYMPH NODE  Surgeon(s): Coralie Keens, MD    06/24/2018 Cancer Staging   Staging form: Corpus Uteri - Carcinoma and Carcinosarcoma, AJCC 8th Edition - Clinical: Stage IVB (cT1a, cN2, pM1) - Signed by [name], MD on 06/24/2018    Genetic Testing   Patient has genetic testing done for MMR on pathology from 06/18/2018. Results revealed patient has the following mutation(s): MMR: abnormal   06/29/2018 Procedure   Placement of a subcutaneous port device. Catheter tip at the SVC and right atrium junction.    Genetic Testing   Patient has genetic testing done for MSI on pathology  from 06/18/2018. Results revealed patient has the following mutation(s): MSI: High   07/02/2018 PET scan   Previous hysterectomy, with asymmetric focus of hypermetabolic activity in the left vaginal cuff, suspicious for residual or recurrent carcinoma.  Large hypermetabolic soft tissue mass involving the left pelvic sidewall and acetabulum, consistent with metastatic disease.  No evidence metastatic disease within the abdomen, chest, or neck.   07/09/2018 Tumor Marker   Patient's tumor was tested for the following markers: CA-125 Results of the tumor marker test revealed 9   07/10/2018 - 08/24/2018 Chemotherapy   The patient had carboplatin and taxol x 3 cycles   07/17/2018 Genetic Testing   Negative genetic testing on the common hereditary cancer panel.  The Common Hereditary Gene Panel offered by [facility] includes sequencing and/or deletion duplication testing of the following 48 genes: APC, ATM, AXIN2, BARD1, BMPR1A, BRCA1, BRCA2, BRIP1, CDH1, CDK4, CDKN2A (p14ARF), CDKN2A (p16INK4a), CHEK2, CTNNA1, DICER1, EPCAM (Deletion/duplication testing only), GREM1 (promoter region deletion/duplication testing only), KIT, MEN1, MLH1, MSH2, MSH3, MSH6, MUTYH, NBN, NF1, NHTL1, PALB2, PDGFRA, PMS2, POLD1, POLE, PTEN, RAD50, RAD51C, RAD51D, RNF43, SDHB, SDHC, SDHD, SMAD4, SMARCA4. STK11, TP53, TSC1, TSC2, and VHL.  The following genes were evaluated for sequence changes only: SDHA and HOXB13 c.251G>A variant only. The report date is Jul 17, 2018.    10/03/2018 Imaging   CT abdomen and pelvis 1.  No acute intra-abdominal process. 2. Grossly unchanged left pelvic sidewall mass with osseous involvement of the medial acetabulum. Progressive mild displacement of the associated comminuted pathologic fracture involving the right acetabulum and puboacetabular junction.  3.  New venous stents extending from the left common iliac vein origin to the proximal left common femoral vein. The stents are patent.    11/06/2018 -  Chemotherapy   The patient had pembrolizumab for chemotherapy treatment.     01/28/2019 Imaging   1. No substantial interval change in exam. 2. Interval development of mild fullness in the left intrarenal collecting system and ureter without overt hydronephrosis at this time. 3. Abnormal soft tissue along the left pelvic sidewall has decreased slightly in the interval. 4. Similar appearance of ill-defined fascial planes in the pelvis with some peritoneal thickening along the right pelvic sidewall and potentially involving the sigmoid mesocolon. 5. No substantial ascites.   05/03/2019 Imaging   1. Stable mild left pelvic sidewall soft tissue density. No new or progressive disease identified within the abdomen or pelvis.  2. Colonic diverticulosis. No radiographic evidence of diverticulitis.   Aortic Atherosclerosis (ICD10-I70.0).   Metastasis to lymph nodes ([place])  06/23/2018 Initial Diagnosis   Metastasis to lymph nodes ([place])   07/10/2018 - 09/14/2018 Chemotherapy   The patient had palonosetron (ALOXI) injection 0.25 mg, 0.25 mg, Intravenous,  Once, 3 of 6 cycles Administration: 0.25 mg (07/10/2018), 0.25 mg (07/31/2018), 0.25 mg (08/24/2018) CARBOplatin (PARAPLATIN) 480 mg in sodium chloride 0.9 % 250 mL chemo infusion, 480 mg (100 % of original dose 482.5 mg), Intravenous,  Once, 3 of 6 cycles Dose modification: 482.5 mg (original dose 482.5 mg, Cycle 1) Administration: 480 mg (07/10/2018), 480 mg (07/31/2018), 480 mg (08/24/2018) PACLitaxel (TAXOL) 276 mg in sodium chloride 0.9 % 250 mL chemo infusion (> '80mg'$ /m2), 140 mg/m2 = 276 mg (80 % of original dose 175 mg/m2), Intravenous,  Once, 3 of 6 cycles Dose modification: 140 mg/m2 (80 % of original dose 175 mg/m2, Cycle 1, Reason: Dose Not Tolerated) Administration: 276 mg (07/10/2018), 276 mg (07/31/2018), 276 mg (08/24/2018) fosaprepitant (EMEND) 150 mg, dexamethasone (DECADRON) 12 mg in sodium chloride 0.9 % 145 mL IVPB, , Intravenous,   Once, 3 of 6 cycles Administration:  (07/10/2018),  (07/31/2018),  (08/24/2018)  for chemotherapy treatment.    11/06/2018 -  Chemotherapy   The patient had pembrolizumab for chemotherapy treatment.     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 ([place])  06/24/2018 Initial Diagnosis   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 ([place])   07/10/2018 - 09/14/2018 Chemotherapy   The patient had palonosetron (ALOXI) injection 0.25 mg, 0.25 mg, Intravenous,  Once, 3 of 6 cycles Administration: 0.25 mg (07/10/2018), 0.25 mg (07/31/2018), 0.25 mg (08/24/2018) CARBOplatin (PARAPLATIN) 480 mg in sodium chloride 0.9 % 250 mL chemo infusion, 480 mg (100 % of original dose 482.5 mg), Intravenous,  Once, 3 of 6 cycles Dose modification: 482.5 mg (original dose 482.5 mg, Cycle 1) Administration: 480 mg (07/10/2018), 480 mg (07/31/2018), 480 mg (08/24/2018) PACLitaxel (TAXOL) 276 mg in sodium chloride 0.9 % 250 mL chemo infusion (> '80mg'$ /m2), 140 mg/m2 = 276 mg (80 % of original dose 175 mg/m2), Intravenous,  Once, 3 of 6 cycles Dose modification: 140 mg/m2 (80 % of original dose 175 mg/m2, Cycle 1, Reason: Dose Not Tolerated) Administration: 276 mg (07/10/2018), 276 mg (07/31/2018), 276 mg (08/24/2018) fosaprepitant (EMEND) 150 mg, dexamethasone (DECADRON) 12 mg in sodium chloride 0.9 % 145 mL IVPB, , Intravenous,  Once, 3 of 6 cycles Administration:  (07/10/2018),  (07/31/2018),  (08/24/2018)  for chemotherapy treatment.    11/06/2018 -  Chemotherapy   The patient had pembrolizumab for chemotherapy treatment.     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       REVIEW OF SYSTEMS:   Constitutional: Denies fevers, chills or abnormal weight loss Eyes: Denies blurriness of vision Ears, nose, mouth, throat, and face: Denies  mucositis or sore throat Respiratory: Denies cough, dyspnea or wheezes Cardiovascular: Denies palpitation, chest discomfort Gastrointestinal:  Denies nausea, heartburn or change in bowel habits Skin: Denies abnormal skin rashes Lymphatics: Denies new lymphadenopathy or easy bruising Neurological:Denies numbness, tingling or new weaknesses Behavioral/Psych: Mood is stable, no new changes  All other systems were reviewed with the patient and are negative.  I have reviewed the past medical history, past surgical history, social history and family history with the patient and they are unchanged from previous note.  ALLERGIES:  has No Known Allergies.  MEDICATIONS:  Current Outpatient Medications  Medication Sig Dispense Refill  . clopidogrel (PLAVIX) 75 MG tablet Take 1 tablet (75 mg total) by mouth daily with breakfast. 30 tablet 1  . diclofenac sodium (VOLTAREN) 1 % GEL APPLY 4GRAMS 4 TIMES A DAY AS NEEDED FOR PAINS    . Eliquis DVT/PE Starter Pack (ELIQUIS STARTER PACK) 5 MG TABS Take as directed on package: start with two-'5mg'$  tablets twice daily for 7 days. On day 8, switch to one-'5mg'$  tablet twice daily. 1 each 0  . methadone (DOLOPHINE) 10 MG tablet Take 1 tablet (10 mg total) by mouth every 12 (twelve) hours. 60 tablet 0  . morphine (MSIR) 15 MG tablet Take 1 tablet (15 mg total) by mouth every 6 (six) hours as needed for severe pain. 60 tablet 0  . Olopatadine HCl 0.2 % SOLN Place 1 drop into both eyes daily.     No current facility-administered medications for this visit.    PHYSICAL EXAMINATION: ECOG PERFORMANCE STATUS: 2 - Symptomatic, <50% confined to bed  Vitals:   08/20/19 1005  BP: (!) 114/53  Pulse: 73  Resp: 18  Temp: (!) 97.5 F (36.4 C)  SpO2: 100%   Filed Weights   08/20/19 1005  Weight: 207 lb 11.2 oz (94.2 kg)    GENERAL:alert, no distress and comfortable.  Limited examination due to adipose tissues SKIN: skin color, texture, turgor are normal, no rashes  or significant lesions EYES: normal, Conjunctiva are pink and non-injected, sclera clear OROPHARYNX:no exudate, no erythema and lips, buccal mucosa, and tongue normal  NECK: supple, thyroid normal size, non-tender, without nodularity LYMPH:  no palpable lymphadenopathy in the cervical, axillary or inguinal LUNGS: clear to auscultation and percussion with normal breathing effort HEART: regular rate & rhythm and no murmurs with moderate bilateral lower extremity edema ABDOMEN:abdomen soft, non-tender and normal bowel sounds Musculoskeletal:no cyanosis of digits and no clubbing  NEURO: alert & oriented x 3 with fluent speech, no focal motor/sensory deficits  LABORATORY DATA:  I have reviewed the data as listed    Component Value Date/Time   NA 138 08/20/2019 0926   K 3.7 08/20/2019 0926   CL 106 08/20/2019 0926   CO2 24 08/20/2019 0926   GLUCOSE 94 08/20/2019 0926   BUN 14 08/20/2019 0926   CREATININE 0.77 08/20/2019 0926   CALCIUM 9.1 08/20/2019 0926   PROT 7.2 08/20/2019 0926   ALBUMIN 3.8 08/20/2019 0926   AST 11 (L) 08/20/2019 0926   ALT 9 08/20/2019 0926   ALKPHOS 108 08/20/2019 0926   BILITOT 0.8 08/20/2019 0926  GFRNONAA >60 08/20/2019 0926   GFRAA >60 08/20/2019 0926    No results found for: SPEP, UPEP  Lab Results  Component Value Date   WBC 2.9 (L) 08/20/2019   NEUTROABS 1.8 08/20/2019   HGB 11.5 (L) 08/20/2019   HCT 35.1 (L) 08/20/2019   MCV 94.1 08/20/2019   PLT 180 08/20/2019      Chemistry      Component Value Date/Time   NA 138 08/20/2019 0926   K 3.7 08/20/2019 0926   CL 106 08/20/2019 0926   CO2 24 08/20/2019 0926   BUN 14 08/20/2019 0926   CREATININE 0.77 08/20/2019 0926      Component Value Date/Time   CALCIUM 9.1 08/20/2019 0926   ALKPHOS 108 08/20/2019 0926   AST 11 (L) 08/20/2019 0926   ALT 9 08/20/2019 0926   BILITOT 0.8 08/20/2019 0926

## 2019-08-20 NOTE — Patient Instructions (Signed)
[facility] Discharge Instructions for Patients Receiving Chemotherapy  Today you received the following chemotherapy agents:  Keytruda.  To help prevent nausea and vomiting after your treatment, we encourage you to take your nausea medication as directed.   If you develop nausea and vomiting that is not controlled by your nausea medication, call the clinic.   BELOW ARE SYMPTOMS THAT SHOULD BE REPORTED IMMEDIATELY:  *FEVER GREATER THAN 100.5 F  *CHILLS WITH OR WITHOUT FEVER  NAUSEA AND VOMITING THAT IS NOT CONTROLLED WITH YOUR NAUSEA MEDICATION  *UNUSUAL SHORTNESS OF BREATH  *UNUSUAL BRUISING OR BLEEDING  TENDERNESS IN MOUTH AND THROAT WITH OR WITHOUT PRESENCE OF ULCERS  *URINARY PROBLEMS  *BOWEL PROBLEMS  UNUSUAL RASH Items with * indicate a potential emergency and should be followed up as soon as possible.  Feel free to call the clinic should you have any questions or concerns. The clinic phone number is [phone].  Please show the CHEMO ALERT CARD at check-in to the Emergency Department and triage nurse.    

## 2019-08-20 NOTE — Assessment & Plan Note (Signed)
She was found to have iron deficiency anemia and borderline vitamin B12 deficiency After receiving intravenous iron infusion, her blood counts are stable We will continue to monitor closely She is not symptomatic We will proceed without delay 

## 2019-08-20 NOTE — Assessment & Plan Note (Signed)
She has excellent pain control She will continue current prescribed morphine sulfate and methadone as needed We discussed narcotic refill policy

## 2019-08-20 NOTE — Assessment & Plan Note (Signed)
She will continue anticoagulation therapy indefinitely 

## 2019-08-20 NOTE — Assessment & Plan Note (Signed)
She has excellent response to treatment with her recent CT imaging from March There has been no growth in the lymph node in the pelvis She has nearly no side effects from treatment so far We will continue treatment indefinitely every 3 weeks I plan to repeat imaging study in 3 weeks for objective assessment of response to therapy

## 2019-08-20 NOTE — Telephone Encounter (Signed)
Scheduled appts per 7/2 los. Gave pt a print out of AVS 

## 2019-08-20 NOTE — Patient Instructions (Signed)

## 2019-09-09 ENCOUNTER — Other Ambulatory Visit: Payer: Self-pay

## 2019-09-09 ENCOUNTER — Ambulatory Visit (HOSPITAL_COMMUNITY)
Admission: RE | Admit: 2019-09-09 | Discharge: 2019-09-09 | Disposition: A | Discharge status: Home / Self Care | Payer: Medicare Other | Source: Ambulatory Visit | Attending: Hematology and Oncology | Admitting: Hematology and Oncology

## 2019-09-09 ENCOUNTER — Encounter (HOSPITAL_COMMUNITY): Payer: Self-pay

## 2019-09-09 DIAGNOSIS — C55 Malignant neoplasm of uterus, part unspecified: Secondary | ICD-10-CM | POA: Diagnosis not present

## 2019-09-09 MED ORDER — SODIUM CHLORIDE (PF) 0.9 % IJ SOLN
INTRAMUSCULAR | Status: AC
  Filled 2019-09-09: qty 50

## 2019-09-09 MED ORDER — IOHEXOL 300 MG/ML  SOLN
100.0000 mL | Freq: Once | INTRAMUSCULAR | Status: AC | PRN
  Administered 2019-09-09: 100 mL via INTRAVENOUS

## 2019-09-10 ENCOUNTER — Other Ambulatory Visit: Payer: Self-pay

## 2019-09-10 ENCOUNTER — Inpatient Hospital Stay: Payer: Medicare Other

## 2019-09-10 ENCOUNTER — Encounter: Payer: Self-pay | Admitting: Hematology and Oncology

## 2019-09-10 ENCOUNTER — Inpatient Hospital Stay (HOSPITAL_BASED_OUTPATIENT_CLINIC_OR_DEPARTMENT_OTHER): Payer: Medicare Other | Admitting: Hematology and Oncology

## 2019-09-10 DIAGNOSIS — D61818 Other pancytopenia: Secondary | ICD-10-CM

## 2019-09-10 DIAGNOSIS — G893 Neoplasm related pain (acute) (chronic): Secondary | ICD-10-CM

## 2019-09-10 DIAGNOSIS — C7951 Secondary malignant neoplasm of bone: Secondary | ICD-10-CM

## 2019-09-10 DIAGNOSIS — D539 Nutritional anemia, unspecified: Secondary | ICD-10-CM

## 2019-09-10 DIAGNOSIS — C55 Malignant neoplasm of uterus, part unspecified: Secondary | ICD-10-CM

## 2019-09-10 DIAGNOSIS — C774 Secondary and unspecified malignant neoplasm of inguinal and lower limb lymph nodes: Secondary | ICD-10-CM

## 2019-09-10 DIAGNOSIS — C801 Malignant (primary) neoplasm, unspecified: Secondary | ICD-10-CM

## 2019-09-10 DIAGNOSIS — Z7189 Other specified counseling: Secondary | ICD-10-CM

## 2019-09-10 DIAGNOSIS — I825Z2 Chronic embolism and thrombosis of unspecified deep veins of left distal lower extremity: Secondary | ICD-10-CM

## 2019-09-10 DIAGNOSIS — R5381 Other malaise: Secondary | ICD-10-CM

## 2019-09-10 DIAGNOSIS — C541 Malignant neoplasm of endometrium: Secondary | ICD-10-CM | POA: Diagnosis not present

## 2019-09-10 LAB — CBC WITH DIFFERENTIAL (CANCER CENTER ONLY)
Abs Immature Granulocytes: 0.02 10*3/uL (ref 0.00–0.07)
Basophils Absolute: 0 10*3/uL (ref 0.0–0.1)
Basophils Relative: 0 %
Eosinophils Absolute: 0.1 10*3/uL (ref 0.0–0.5)
Eosinophils Relative: 2 %
HCT: 34.9 % — ABNORMAL LOW (ref 36.0–46.0)
Hemoglobin: 11.7 g/dL — ABNORMAL LOW (ref 12.0–15.0)
Immature Granulocytes: 1 %
Lymphocytes Relative: 21 %
Lymphs Abs: 0.6 10*3/uL — ABNORMAL LOW (ref 0.7–4.0)
MCH: 31.4 pg (ref 26.0–34.0)
MCHC: 33.5 g/dL (ref 30.0–36.0)
MCV: 93.6 fL (ref 80.0–100.0)
Monocytes Absolute: 0.3 10*3/uL (ref 0.1–1.0)
Monocytes Relative: 11 %
Neutro Abs: 1.9 10*3/uL (ref 1.7–7.7)
Neutrophils Relative %: 65 %
Platelet Count: 182 10*3/uL (ref 150–400)
RBC: 3.73 MIL/uL — ABNORMAL LOW (ref 3.87–5.11)
RDW: 12.9 % (ref 11.5–15.5)
WBC Count: 2.9 10*3/uL — ABNORMAL LOW (ref 4.0–10.5)
nRBC: 0 % (ref 0.0–0.2)

## 2019-09-10 LAB — CMP (CANCER CENTER ONLY)
ALT: 6 U/L (ref 0–44)
AST: 12 U/L — ABNORMAL LOW (ref 15–41)
Albumin: 3.7 g/dL (ref 3.5–5.0)
Alkaline Phosphatase: 108 U/L (ref 38–126)
Anion gap: 8 (ref 5–15)
BUN: 12 mg/dL (ref 8–23)
CO2: 25 mmol/L (ref 22–32)
Calcium: 9.5 mg/dL (ref 8.9–10.3)
Chloride: 107 mmol/L (ref 98–111)
Creatinine: 0.7 mg/dL (ref 0.44–1.00)
GFR, Est AFR Am: >60 mL/min (ref >60)
GFR, Estimated: >60 mL/min (ref >60)
Glucose, Bld: 87 mg/dL (ref 70–99)
Potassium: 3.7 mmol/L (ref 3.5–5.1)
Sodium: 140 mmol/L (ref 135–145)
Total Bilirubin: 0.7 mg/dL (ref 0.3–1.2)
Total Protein: 7 g/dL (ref 6.5–8.1)

## 2019-09-10 LAB — TSH: TSH: 2.426 u[IU]/mL (ref 0.308–3.960)

## 2019-09-10 MED ORDER — HEPARIN SOD (PORK) LOCK FLUSH 100 UNIT/ML IV SOLN
500.0000 [IU] | Freq: Once | INTRAVENOUS | Status: AC | PRN
  Administered 2019-09-10: 500 [IU]
  Filled 2019-09-10: qty 5

## 2019-09-10 MED ORDER — SODIUM CHLORIDE 0.9 % IV SOLN
200.0000 mg | Freq: Once | INTRAVENOUS | Status: AC
  Administered 2019-09-10: 200 mg via INTRAVENOUS
  Filled 2019-09-10: qty 8

## 2019-09-10 MED ORDER — SODIUM CHLORIDE 0.9% FLUSH
10.0000 mL | Freq: Once | INTRAVENOUS | Status: AC
  Administered 2019-09-10: 10 mL
  Filled 2019-09-10: qty 10

## 2019-09-10 MED ORDER — SODIUM CHLORIDE 0.9 % IV SOLN
Freq: Once | INTRAVENOUS | Status: AC
  Filled 2019-09-10: qty 250

## 2019-09-10 MED ORDER — SODIUM CHLORIDE 0.9% FLUSH
10.0000 mL | INTRAVENOUS | Status: DC | PRN
  Administered 2019-09-10: 10 mL
  Filled 2019-09-10: qty 10

## 2019-09-10 NOTE — Assessment & Plan Note (Signed)
She will continue anticoagulation therapy indefinitely 

## 2019-09-10 NOTE — Assessment & Plan Note (Addendum)
She has reasonable pain control She will continue current prescribed morphine sulfate and methadone as needed We discussed narcotic refill policy 

## 2019-09-10 NOTE — Patient Instructions (Signed)
[facility] Discharge Instructions for Patients Receiving Chemotherapy  Today you received the following chemotherapy agents: pembrolizumab.  To help prevent nausea and vomiting after your treatment, we encourage you to take your nausea medication as directed.   If you develop nausea and vomiting that is not controlled by your nausea medication, call the clinic.   BELOW ARE SYMPTOMS THAT SHOULD BE REPORTED IMMEDIATELY:  *FEVER GREATER THAN 100.5 F  *CHILLS WITH OR WITHOUT FEVER  NAUSEA AND VOMITING THAT IS NOT CONTROLLED WITH YOUR NAUSEA MEDICATION  *UNUSUAL SHORTNESS OF BREATH  *UNUSUAL BRUISING OR BLEEDING  TENDERNESS IN MOUTH AND THROAT WITH OR WITHOUT PRESENCE OF ULCERS  *URINARY PROBLEMS  *BOWEL PROBLEMS  UNUSUAL RASH Items with * indicate a potential emergency and should be followed up as soon as possible.  Feel free to call the clinic should you have any questions or concerns. The clinic phone number is [phone].  Please show the CHEMO ALERT CARD at check-in to the Emergency Department and triage nurse.   

## 2019-09-10 NOTE — Assessment & Plan Note (Signed)
I have reviewed imaging studies with the patient She has stable CT imaging It is likely that the soft tissue thickening along the pelvic sidewall will remain the same indefinitely, fibrotic changes cannot be excluded She tolerated treatment very well Due to the recurrent disease status, I recommend she stays on pembrolizumab indefinitely and she is in agreement with the plan of care The stability of her CT imaging, I plan to space out the interval imaging studies, the next one would be January 2022.  I will continue to see her every other treatment for toxicity review

## 2019-09-10 NOTE — Assessment & Plan Note (Signed)
She was found to have iron deficiency anemia and borderline vitamin B12 deficiency After receiving intravenous iron infusion, her blood counts are stable We will continue to monitor closely She is not symptomatic We will proceed without delay 

## 2019-09-10 NOTE — Patient Instructions (Signed)

## 2019-09-10 NOTE — Assessment & Plan Note (Signed)
I plan to recheck serum iron studies and B12 level again in her next visit

## 2019-09-10 NOTE — Progress Notes (Signed)
[place] OFFICE PROGRESS NOTE  Patient Care Team: Nolene Ebbs, MD as PCP - General (Internal Medicine)  ASSESSMENT & PLAN:  Uterine cancer [facility]) I have reviewed imaging studies with the patient She has stable CT imaging It is likely that the soft tissue thickening along the pelvic sidewall will remain the same indefinitely, fibrotic changes cannot be excluded She tolerated treatment very well Due to the recurrent disease status, I recommend she stays on pembrolizumab indefinitely and she is in agreement with the plan of care The stability of her CT imaging, I plan to space out the interval imaging studies, the next one would be January 2022.  I will continue to see her every other treatment for toxicity review  Cancer associated pain She has reasonable pain control She will continue current prescribed morphine sulfate and methadone as needed We discussed narcotic refill policy  Pancytopenia, acquired ([place]) She was found to have iron deficiency anemia and borderline vitamin B12 deficiency After receiving intravenous iron infusion, her blood counts are stable We will continue to monitor closely She is not symptomatic We will proceed without delay  Lower leg DVT (deep venous thromboembolism), chronic, left ([place]) She will continue anticoagulation therapy indefinitely  Deficiency anemia I plan to recheck serum iron studies and B12 level again in her next visit  Physical debility Her muscles are weak We discussed the benefits of physical therapy and rehab and she is in agreement   Orders Placed This Encounter  Procedures  . Iron and TIBC    Standing Status:   Future    Standing Expiration Date:   09/09/2020  . Ferritin    Standing Status:   Future    Standing Expiration Date:   09/09/2020  . Vitamin B12    Standing Status:   Future    Standing Expiration Date:   09/09/2020    All questions were answered. The patient knows to call the clinic with any  problems, questions or concerns. The total time spent in the appointment was 30 minutes encounter with patients including review of chart and various tests results, discussions about plan of care and coordination of care plan   [name], MD 09/10/2019 11:00 AM  INTERVAL HISTORY: Please see below for problem oriented charting. She returns for treatment and follow-up She tolerated treatment well without side effects She has noticed some intermittent neuropathy affecting the left leg and her primary care doctor has recently started her on gabapentin She tolerated that well without excessive sedation or constipation She still have a lot of muscular pain and discomfort on the left leg and have difficulties walking The patient denies any recent signs or symptoms of bleeding such as spontaneous epistaxis, hematuria or hematochezia. 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 Uterus +/- tubes/ovaries, neoplastic, with left fallopian tube and ovary - INVASIVE ENDOMETRIOID CARCINOMA (1.5 CM), FIGO GRADE II, ARISING IN A BACKGROUND OF ATYPICAL COMPLEX HYPERPLASIA, CONFINED WITHIN INNER HALF OF THE MYOMETRIUM. - ENDOMETRIAL POLYP WITH ASSOCIATED ATYPICAL COMPLEX HYPERPLASIA. - MYOMETRIUM: LEIOMYOMATA. - CERVIX: BENIGN SQUAMOUS MUCOSA AND ENDOCERVICAL MUCOSA, NO DYSPLASIA OR MALIGNANCY. - LEFT OVARY: BENIGN OVARIAN TISSUE WITH ENDOSALPINGOSIS, NO EVIDENCE OF ATYPIA OR MALIGNANCY. - LEFT FALLOPIAN TUBE: NO HISTOLOGIC ABNORMALITIES. - PLEASE SEE ONCOLOGY TEMPLATE FOR DETAIL.  2. Ovary and fallopian tube, right - BENIGN OVARIAN TISSUE WITH ENDOSALPINGOSIS, NO ATYPIA OR MALIGNANCY. - BENIGN FALLOPIAN TUBAL TISSUE, NO PATHOLOGIC ABNORMALITIES. Microscopic  Comment 1. UTERUS Specimen: Uterus, cervix, bilateral ovaries and fallopian tubes Procedure: Total hysterectomy and bilateral salpingo-oophorectomy Lymph node sampling performed: No Specimen integrity: Intact Maximum tumor size (cm): 1.5 cm, glass slide measurement Histologic type: Invasive endometrioid carcinoma Grade: FIGO grade II Myometrial invasion: 1 cm where myometrium is 2.3 cm in thickness Cervical stromal involvement: No Extent of involvement of other organs: No Lymph vascular invasion: Not identified Peritoneal washings: Negative ([id number]) Lymph nodes: number examined N/A; number positive N/A TNM code: pT1a, pNX 1 oFf 3IGO Stage (based on pathologic findings, needs clinical correlation): IA  Comments: Sections the endomyometrium away from the grossly identified endometrial polyp show an invasive FIGO grade II endometrioid carcinoma. The tumor is confined within inner half of the myometrium. No angiolymphatic invasion is identified. No cervical stromal involvement is identified. Sections of the grossly identified endometrial polyp show an endometrial polyp with associated atypical compacted hyperplasia with no definitive evidence of carcinoma.   12/07/2017 Imaging   US venous Doppler Right: No evidence of common femoral vein obstruction. Left: Findings consistent with acute deep vein thrombosis involving the left femoral vein, left proximal profunda vein, and left popliteal vein. Unable to adequately interrogate the common femoral and higher, or the calf secondary to significant edema and body habitus   12/07/2017 [facility] Admission   She presented to the ER and was diagnosed with acute DVT   01/18/2018 - 01/21/2018 Hospital Admission   She was admitted to the hospital for management of severe persistent DVT   01/18/2018 Imaging   US venous Doppler Right: No evidence of common femoral vein obstruction. Left: Findings consistent with acute deep vein thrombosis involving  the left common femoral vein, and left popliteal vein.   01/19/2018 Surgery   Pre-operative Diagnosis: Subacute DVT with severe post thrombotic syndrome Post-operative diagnosis:  Same Surgeon:  Erlene Quan C. Donzetta Matters, MD Procedure Performed: 1.  Ultrasound-guided cannulation left small saphenous vein 2.  Left lower extremity and central venography 3.  Intravascular ultrasound of left popliteal, femoral, common femoral, external and common iliac veins and IVC 4.  Stent of left common and external iliac veins with 14 x 60 mm Vici 5.  Moderate sedation with fentanyl and Versed for 50 minutes  Indications: 74 year old female with a history of DVT in October now presents with persistent left lower extremity swelling and ultrasound demonstrating likely persistent DVT.  She has been on Xarelto at this time.  She is now indicated for venogram possible intervention.  Findings: Flow in the left lower extremity was stagnant throughout but by venogram all veins were patent.  There was a focal occlusive area approximately 2 cm in length at the common and external iliac vein junction at the hypogastric on the left.  After stenting and ballooning we had a diameter of 12 millimeters in the stent and venogram demonstrated flow in the lower extremity veins were previously was stagnant and no further residual stenosis in the left common and external iliac vein junction.   04/12/2018 Imaging   US Venous Doppler Right: No evidence of common femoral vein obstruction. Left: There is no evidence of deep vein thrombosis in the lower extremity. However, portions of this examination were limited- see technologist comments above. Left groin: Large hypoechoic area with mixed echoes noted measuring nearly 10 cm. Possible  hematoma versus unknown etiology. Ultrasound characteristics of  enlarged lymph nodes noted in the groin.      05/15/2018 Imaging   US Venous Doppler Right: No evidence of deep vein thrombosis in the lower  extremity. No indirect evidence of obstruction proximal to the inguinal ligament. Left: No reflux was noted in the common femoral vein , femoral vein in the thigh, popliteal vein, great saphenous vein at the saphenofemoral junction, great saphenous vein at the proximal thigh, great saphenous vein at the mid thigh, great saphenous vein  at the distal thigh, great saphenous vein at the knee, origin of the small saphenous vein, proximal small saphenous vein, and mid small saphenous vein. There is no evidence of deep vein thrombosis in the lower extremity. There is no evidence of superficial venous thrombosis. No cystic structure found in the popliteal fossa. Unable to evaluate extension of common femoral vein obstruction proximal to the inguinal ligament.   06/01/2018 Imaging   1. 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 Additionally, there is lucency involving the medial sidewall of the left acetabulum with potential nondisplaced pathologic fracture. Further evaluation with contrast-enhanced pelvic MRI could be performed as clinically indicated. 2. The left pelvic arterial and venous system is encased by this infiltrative left pelvic sidewall mass however while difficult to ascertain, the left external iliac venous stent appears patent.   06/18/2018 Pathology Results   Lymph node for lymphoma, Left Inguinal - METASTATIC ADENOCARCINOMA, SEE COMMENT. Microscopic Comment Immunohistochemistry is positive for cytokeratin 7, PAX8, ER, and PR. Cytokeratin 5/6,and p63 are negative. The immunoprofile along with the patient's history are consistent with a gynecologic primary.   06/18/2018 Surgery   Pre-op Diagnosis: INGUINAL LYMPHADENOPATHY, PELVIC MASS     Procedure(s): EXCISIONAL BIOPSY DEEP LEFT INGUINAL LYMPH NODE  Surgeon(s): Coralie Keens, MD    06/24/2018 Cancer Staging   Staging form:  Corpus Uteri - Carcinoma and Carcinosarcoma, AJCC 8th Edition - Clinical: Stage IVB (cT1a, cN2, pM1) - Signed by [name], MD on 06/24/2018    Genetic Testing   Patient has genetic testing done for MMR on pathology from 06/18/2018. Results revealed patient has the following mutation(s): MMR: abnormal   06/29/2018 Procedure   Placement of a subcutaneous port device. Catheter tip at the SVC and right atrium junction.    Genetic Testing   Patient has genetic testing done for MSI on pathology from 06/18/2018. Results revealed patient has the following mutation(s): MSI: High   07/02/2018 PET scan   Previous hysterectomy, with asymmetric focus of hypermetabolic activity in the left vaginal cuff, suspicious for residual or recurrent carcinoma.  Large hypermetabolic soft tissue mass involving the left pelvic sidewall and acetabulum, consistent with metastatic disease.  No evidence metastatic disease within the abdomen, chest, or neck.   07/09/2018 Tumor Marker   Patient's tumor was tested for the following markers: CA-125 Results of the tumor marker test revealed 9   07/10/2018 - 08/24/2018 Chemotherapy   The patient had carboplatin and taxol x 3 cycles   07/17/2018 Genetic Testing   Negative genetic testing on the common hereditary cancer panel.  The Common Hereditary Gene Panel offered by [facility] includes sequencing and/or deletion duplication testing of the following 48 genes: APC, ATM, AXIN2, BARD1, BMPR1A, BRCA1, BRCA2, BRIP1, CDH1, CDK4, CDKN2A (p14ARF), CDKN2A (p16INK4a), CHEK2, CTNNA1, DICER1, EPCAM (Deletion/duplication testing only), GREM1 (promoter region deletion/duplication testing only), KIT, MEN1, MLH1, MSH2, MSH3, MSH6, MUTYH, NBN, NF1, NHTL1, PALB2, PDGFRA, PMS2, POLD1, POLE, PTEN, RAD50, RAD51C, RAD51D,  RNF43, SDHB, SDHC, SDHD, SMAD4, SMARCA4. STK11, TP53, TSC1, TSC2, and VHL.  The following genes were evaluated for sequence changes only: SDHA and HOXB13 c.251G>A variant only. The  report date is Jul 17, 2018.    10/03/2018 Imaging   CT abdomen and pelvis 1.  No acute intra-abdominal process. 2. Grossly unchanged left pelvic sidewall mass with osseous involvement of the medial acetabulum. Progressive mild displacement of the associated comminuted pathologic fracture involving the right acetabulum and puboacetabular junction.  3. New venous stents extending from the left common iliac vein origin to the proximal left common femoral vein. The stents are patent.   11/06/2018 -  Chemotherapy   The patient had pembrolizumab for chemotherapy treatment.     01/28/2019 Imaging   1. No substantial interval change in exam. 2. Interval development of mild fullness in the left intrarenal collecting system and ureter without overt hydronephrosis at this time. 3. Abnormal soft tissue along the left pelvic sidewall has decreased slightly in the interval. 4. Similar appearance of ill-defined fascial planes in the pelvis with some peritoneal thickening along the right pelvic sidewall and potentially involving the sigmoid mesocolon. 5. No substantial ascites.   05/03/2019 Imaging   1. Stable mild left pelvic sidewall soft tissue density. No new or progressive disease identified within the abdomen or pelvis.  2. Colonic diverticulosis. No radiographic evidence of diverticulitis.   Aortic Atherosclerosis (ICD10-I70.0).   09/09/2019 Imaging   1. No change in appearance of soft tissue thickening along the LEFT pelvic sidewall adjacent to chronic LEFT acetabular fracture. 2. Mild asymmetry of the bladder wall favoring the LEFT bladder wall, not well assessed. Similar accounting for variable degrees of distension on prior studies potentially related to prior radiation, attention on follow-up. 3. Signs of venous stenting in the LEFT hemipelvis with LEFT lower extremity muscular atrophy and mild stranding with similar appearance. Signs of colonic diverticulosis and diverticular disease without  change.   Metastasis to lymph nodes ([place])  06/23/2018 Initial Diagnosis   Metastasis to lymph nodes ([place])   07/10/2018 - 09/14/2018 Chemotherapy   The patient had palonosetron (ALOXI) injection 0.25 mg, 0.25 mg, Intravenous,  Once, 3 of 6 cycles Administration: 0.25 mg (07/10/2018), 0.25 mg (07/31/2018), 0.25 mg (08/24/2018) CARBOplatin (PARAPLATIN) 480 mg in sodium chloride 0.9 % 250 mL chemo infusion, 480 mg (100 % of original dose 482.5 mg), Intravenous,  Once, 3 of 6 cycles Dose modification: 482.5 mg (original dose 482.5 mg, Cycle 1) Administration: 480 mg (07/10/2018), 480 mg (07/31/2018), 480 mg (08/24/2018) PACLitaxel (TAXOL) 276 mg in sodium chloride 0.9 % 250 mL chemo infusion (> 24m/m2), 140 mg/m2 = 276 mg (80 % of original dose 175 mg/m2), Intravenous,  Once, 3 of 6 cycles Dose modification: 140 mg/m2 (80 % of original dose 175 mg/m2, Cycle 1, Reason: Dose Not Tolerated) Administration: 276 mg (07/10/2018), 276 mg (07/31/2018), 276 mg (08/24/2018) fosaprepitant (EMEND) 150 mg, dexamethasone (DECADRON) 12 mg in sodium chloride 0.9 % 145 mL IVPB, , Intravenous,  Once, 3 of 6 cycles Administration:  (07/10/2018),  (07/31/2018),  (08/24/2018)  for chemotherapy treatment.    11/06/2018 -  Chemotherapy   The patient had pembrolizumab for chemotherapy treatment.     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 [place]  06/24/2018 Initial Diagnosis   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 ([place]   07/10/2018 - 09/14/2018 Chemotherapy   The patient had palonosetron (ALOXI) injection 0.25 mg, 0.25 mg, Intravenous,  Once, 3 of 6 cycles Administration: 0.25 mg (07/10/2018), 0.25 mg (07/31/2018), 0.25 mg (08/24/2018) CARBOplatin (PARAPLATIN)  480 mg in sodium chloride 0.9 % 250 mL chemo infusion, 480 mg (100 % of original dose 482.5 mg), Intravenous,  Once, 3 of 6 cycles Dose modification: 482.5 mg (original dose 482.5 mg, Cycle 1) Administration: 480 mg (07/10/2018), 480 mg (07/31/2018), 480 mg (08/24/2018) PACLitaxel (TAXOL) 276 mg in sodium chloride 0.9 % 250 mL  chemo infusion (> 43m/m2), 140 mg/m2 = 276 mg (80 % of original dose 175 mg/m2), Intravenous,  Once, 3 of 6 cycles Dose modification: 140 mg/m2 (80 % of original dose 175 mg/m2, Cycle 1, Reason: Dose Not Tolerated) Administration: 276 mg (07/10/2018), 276 mg (07/31/2018), 276 mg (08/24/2018) fosaprepitant (EMEND) 150 mg, dexamethasone (DECADRON) 12 mg in sodium chloride 0.9 % 145 mL IVPB, , Intravenous,  Once, 3 of 6 cycles Administration:  (07/10/2018),  (07/31/2018),  (08/24/2018)  for chemotherapy treatment.    11/06/2018 -  Chemotherapy   The patient had pembrolizumab for chemotherapy treatment.     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       REVIEW OF SYSTEMS:   Constitutional: Denies fevers, chills or abnormal weight loss Eyes: Denies blurriness of vision Ears, nose, mouth, throat, and face: Denies mucositis or sore throat Respiratory: Denies cough, dyspnea or wheezes Cardiovascular: Denies palpitation, chest discomfort Gastrointestinal:  Denies nausea, heartburn or change in bowel habits Skin: Denies abnormal skin rashes Lymphatics: Denies new lymphadenopathy or easy bruising Behavioral/Psych: Mood is stable, no new changes  All other systems were reviewed with the patient and are negative.  I have reviewed the past medical history, past surgical history, social history and family history with the patient and they are unchanged from previous note.  ALLERGIES:  has No Known Allergies.  MEDICATIONS:  Current Outpatient Medications  Medication Sig Dispense Refill  . gabapentin (NEURONTIN) 300 MG capsule Take 300 mg by mouth 2 (two) times daily.    . clopidogrel (PLAVIX) 75 MG tablet Take 1 tablet (75 mg total) by mouth daily with  breakfast. 30 tablet 1  . diclofenac sodium (VOLTAREN) 1 % GEL APPLY 4GRAMS 4 TIMES A DAY AS NEEDED FOR PAINS    . Eliquis DVT/PE Starter Pack (ELIQUIS STARTER PACK) 5 MG TABS Take as directed on package: start with two-527mtablets twice daily for 7 days. On day 8, switch to one-73m3mablet twice daily. 1 each 0  . methadone (DOLOPHINE) 10 MG tablet Take 1 tablet (10 mg total) by mouth every 12 (twelve) hours. 60 tablet 0  . morphine (MSIR) 15 MG tablet Take 1 tablet (15 mg total) by mouth every 6 (six) hours as needed for severe pain. 60 tablet 0  . Olopatadine HCl 0.2 % SOLN Place 1 drop into both eyes daily.     No current facility-administered medications for this visit.    PHYSICAL EXAMINATION: ECOG PERFORMANCE STATUS: 2 - Symptomatic, <50% confined to bed  Vitals:   09/10/19 1054  BP: (!) 120/44  Pulse: 76  Resp: 18  Temp: 97.7 F (36.5 C)  SpO2: 100%   Filed Weights   09/10/19 1054  Weight: (!) 204 lb (92.5 kg)    GENERAL:alert, no distress and comfortable Musculoskeletal:no cyanosis of digits and no clubbing  NEURO: alert & oriented x 3 with fluent speech, no focal motor/sensory deficits.  Poor mobility with unstable gait, uses a walker  LABORATORY DATA:  I have reviewed the data as listed    Component Value Date/Time   NA 138 08/20/2019 0926   K 3.7 08/20/2019 0926   CL 106 08/20/2019 0926   CO2 24 08/20/2019 0926   GLUCOSE 94 08/20/2019 0926   BUN 14 08/20/2019 0926   CREATININE 0.77 08/20/2019 0926   CALCIUM 9.1 08/20/2019 0926   PROT 7.2 08/20/2019 0926   ALBUMIN 3.8 08/20/2019 0926   AST 11 (L) 08/20/2019 0926   ALT 9 08/20/2019 0926   ALKPHOS 108 08/20/2019 0926   BILITOT 0.8 08/20/2019 0926   GFRNONAA >60 08/20/2019 0926   GFRAA >60 08/20/2019 0926    No results found for: SPEP, UPEP  Lab Results  Component Value Date   WBC 2.9 (L) 09/10/2019   NEUTROABS 1.9 09/10/2019   HGB 11.7 (L) 09/10/2019   HCT 34.9 (L) 09/10/2019   MCV 93.6 09/10/2019    PLT 182 09/10/2019      Chemistry      Component Value Date/Time   NA 138 08/20/2019 0926   K 3.7 08/20/2019 0926   CL 106 08/20/2019 0926   CO2 24 08/20/2019 0926   BUN 14 08/20/2019 0926   CREATININE 0.77 08/20/2019 0926      Component Value Date/Time   CALCIUM 9.1 08/20/2019 0926   ALKPHOS 108 08/20/2019 0926   AST 11 (L) 08/20/2019 0926   ALT 9 08/20/2019 0926   BILITOT 0.8 08/20/2019 0926       RADIOGRAPHIC STUDIES: I have personally reviewed the radiological images as listed and agreed with the findings in the report. CT ABDOMEN PELVIS W CONTRAST  Result Date: 09/09/2019 CLINICAL DATA:  History of uterine cancer diagnosed in 2012 status post chemo and radiotherapy, currently reported chemotherapy in progress. EXAM: CT ABDOMEN AND PELVIS WITH CONTRAST TECHNIQUE: Multidetector CT imaging of the abdomen and pelvis was performed using the standard protocol following bolus administration of intravenous contrast. CONTRAST:  175m OMNIPAQUE IOHEXOL 300 MG/ML  SOLN COMPARISON:  05/03/2019 FINDINGS: Lower chest: Minimal basilar atelectasis. Hepatobiliary: No focal, suspicious hepatic lesion. Portal vein is patent. Post cholecystectomy without biliary duct dilation. Pancreas: Pancreas without ductal dilation, inflammation or focal lesion. Spleen: Spleen normal size and contour. Adrenals/Urinary Tract: Adrenal glands are normal. Renal enhancement is symmetric without signs of hydronephrosis. Urinary bladder with limited assessment due to under distension. Mild asymmetry of the bladder wall favoring the LEFT bladder wall, not well assessed. Stomach/Bowel: No acute gastrointestinal process. Colonic diverticulosis. Normal appendix. Vascular/Lymphatic: Vascular structures in the abdomen are patent. No aneurysmal dilation. No significant atherosclerosis. No retroperitoneal adenopathy. No upper abdominal lymphadenopathy. Soft tissue thickening along the LEFT pelvic sidewall measuring 1.8 cm  previously, 1.8 cm on today's study no discrete nodal enlargement in the pelvis. Small scattered lymph nodes in the RIGHT hemipelvis are unchanged. Signs of venous stenting in the LEFT hemipelvis with LEFT lower extremity muscular atrophy and mild stranding with similar appearance. Postoperative changes in the LEFT groin are unchanged. Reproductive: Post hysterectomy. Other: No ascites.  No peritoneal nodularity. Musculoskeletal: No acute musculoskeletal process. Chronic LEFT acetabular fracture and deformity of LEFT femoral head shows no change IMPRESSION: 1. No change in appearance of soft tissue thickening along the LEFT pelvic sidewall adjacent to chronic LEFT acetabular fracture. 2. Mild asymmetry of the bladder wall favoring the LEFT bladder wall, not well assessed. Similar accounting for variable degrees of distension on prior studies potentially related to prior radiation, attention on follow-up. 3. Signs of venous stenting  in the LEFT hemipelvis with LEFT lower extremity muscular atrophy and mild stranding with similar appearance. Signs of colonic diverticulosis and diverticular disease without change. Aortic Atherosclerosis (ICD10-I70.0). Electronically Signed   By: Zetta Bills M.D.   On: 09/09/2019 14:45

## 2019-09-10 NOTE — Assessment & Plan Note (Signed)
Her muscles are weak We discussed the benefits of physical therapy and rehab and she is in agreement

## 2019-10-01 ENCOUNTER — Other Ambulatory Visit: Payer: Self-pay

## 2019-10-01 ENCOUNTER — Encounter: Payer: Self-pay | Admitting: Hematology and Oncology

## 2019-10-01 ENCOUNTER — Inpatient Hospital Stay: Payer: Medicare Other | Attending: Hematology and Oncology

## 2019-10-01 ENCOUNTER — Inpatient Hospital Stay: Payer: Medicare Other

## 2019-10-01 ENCOUNTER — Inpatient Hospital Stay (HOSPITAL_BASED_OUTPATIENT_CLINIC_OR_DEPARTMENT_OTHER): Payer: Medicare Other | Admitting: Hematology and Oncology

## 2019-10-01 DIAGNOSIS — C779 Secondary and unspecified malignant neoplasm of lymph node, unspecified: Secondary | ICD-10-CM | POA: Insufficient documentation

## 2019-10-01 DIAGNOSIS — Z7901 Long term (current) use of anticoagulants: Secondary | ICD-10-CM | POA: Insufficient documentation

## 2019-10-01 DIAGNOSIS — Z79899 Other long term (current) drug therapy: Secondary | ICD-10-CM | POA: Diagnosis not present

## 2019-10-01 DIAGNOSIS — G893 Neoplasm related pain (acute) (chronic): Secondary | ICD-10-CM | POA: Diagnosis not present

## 2019-10-01 DIAGNOSIS — C55 Malignant neoplasm of uterus, part unspecified: Secondary | ICD-10-CM

## 2019-10-01 DIAGNOSIS — C774 Secondary and unspecified malignant neoplasm of inguinal and lower limb lymph nodes: Secondary | ICD-10-CM

## 2019-10-01 DIAGNOSIS — C541 Malignant neoplasm of endometrium: Secondary | ICD-10-CM | POA: Insufficient documentation

## 2019-10-01 DIAGNOSIS — I700 Atherosclerosis of aorta: Secondary | ICD-10-CM | POA: Insufficient documentation

## 2019-10-01 DIAGNOSIS — Z9221 Personal history of antineoplastic chemotherapy: Secondary | ICD-10-CM | POA: Diagnosis not present

## 2019-10-01 DIAGNOSIS — K5730 Diverticulosis of large intestine without perforation or abscess without bleeding: Secondary | ICD-10-CM | POA: Insufficient documentation

## 2019-10-01 DIAGNOSIS — D539 Nutritional anemia, unspecified: Secondary | ICD-10-CM

## 2019-10-01 DIAGNOSIS — Z5112 Encounter for antineoplastic immunotherapy: Secondary | ICD-10-CM | POA: Diagnosis not present

## 2019-10-01 DIAGNOSIS — D61818 Other pancytopenia: Secondary | ICD-10-CM | POA: Diagnosis not present

## 2019-10-01 DIAGNOSIS — C801 Malignant (primary) neoplasm, unspecified: Secondary | ICD-10-CM

## 2019-10-01 DIAGNOSIS — C7951 Secondary malignant neoplasm of bone: Secondary | ICD-10-CM

## 2019-10-01 DIAGNOSIS — Z7189 Other specified counseling: Secondary | ICD-10-CM

## 2019-10-01 DIAGNOSIS — D509 Iron deficiency anemia, unspecified: Secondary | ICD-10-CM | POA: Insufficient documentation

## 2019-10-01 LAB — CBC WITH DIFFERENTIAL (CANCER CENTER ONLY)
Abs Immature Granulocytes: 0 10*3/uL (ref 0.00–0.07)
Basophils Absolute: 0 10*3/uL (ref 0.0–0.1)
Basophils Relative: 1 %
Eosinophils Absolute: 0.1 10*3/uL (ref 0.0–0.5)
Eosinophils Relative: 3 %
HCT: 35.5 % — ABNORMAL LOW (ref 36.0–46.0)
Hemoglobin: 11.7 g/dL — ABNORMAL LOW (ref 12.0–15.0)
Immature Granulocytes: 0 %
Lymphocytes Relative: 20 %
Lymphs Abs: 0.6 10*3/uL — ABNORMAL LOW (ref 0.7–4.0)
MCH: 30.6 pg (ref 26.0–34.0)
MCHC: 33 g/dL (ref 30.0–36.0)
MCV: 92.9 fL (ref 80.0–100.0)
Monocytes Absolute: 0.3 10*3/uL (ref 0.1–1.0)
Monocytes Relative: 11 %
Neutro Abs: 2.1 10*3/uL (ref 1.7–7.7)
Neutrophils Relative %: 65 %
Platelet Count: 182 10*3/uL (ref 150–400)
RBC: 3.82 MIL/uL — ABNORMAL LOW (ref 3.87–5.11)
RDW: 12.5 % (ref 11.5–15.5)
WBC Count: 3.2 10*3/uL — ABNORMAL LOW (ref 4.0–10.5)
nRBC: 0 % (ref 0.0–0.2)

## 2019-10-01 LAB — TSH: TSH: 2.264 u[IU]/mL (ref 0.308–3.960)

## 2019-10-01 LAB — CMP (CANCER CENTER ONLY)
ALT: <6 U/L (ref 0–44)
AST: 10 U/L — ABNORMAL LOW (ref 15–41)
Albumin: 3.7 g/dL (ref 3.5–5.0)
Alkaline Phosphatase: 112 U/L (ref 38–126)
Anion gap: 8 (ref 5–15)
BUN: 15 mg/dL (ref 8–23)
CO2: 23 mmol/L (ref 22–32)
Calcium: 9.8 mg/dL (ref 8.9–10.3)
Chloride: 106 mmol/L (ref 98–111)
Creatinine: 0.72 mg/dL (ref 0.44–1.00)
GFR, Est AFR Am: >60 mL/min (ref >60)
GFR, Estimated: >60 mL/min (ref >60)
Glucose, Bld: 92 mg/dL (ref 70–99)
Potassium: 4.1 mmol/L (ref 3.5–5.1)
Sodium: 137 mmol/L (ref 135–145)
Total Bilirubin: 0.5 mg/dL (ref 0.3–1.2)
Total Protein: 7.2 g/dL (ref 6.5–8.1)

## 2019-10-01 LAB — FERRITIN: Ferritin: 235 ng/mL (ref 11–307)

## 2019-10-01 LAB — IRON AND TIBC
Iron: 49 ug/dL (ref 41–142)
Saturation Ratios: 21 % (ref 21–57)
TIBC: 233 ug/dL — ABNORMAL LOW (ref 236–444)
UIBC: 183 ug/dL (ref 120–384)

## 2019-10-01 LAB — VITAMIN B12: Vitamin B-12: 298 pg/mL (ref 180–914)

## 2019-10-01 MED ORDER — SODIUM CHLORIDE 0.9% FLUSH
10.0000 mL | INTRAVENOUS | Status: DC | PRN
  Administered 2019-10-01: 10 mL
  Filled 2019-10-01: qty 10

## 2019-10-01 MED ORDER — SODIUM CHLORIDE 0.9 % IV SOLN
200.0000 mg | Freq: Once | INTRAVENOUS | Status: AC
  Administered 2019-10-01: 200 mg via INTRAVENOUS
  Filled 2019-10-01: qty 8

## 2019-10-01 MED ORDER — HEPARIN SOD (PORK) LOCK FLUSH 100 UNIT/ML IV SOLN
500.0000 [IU] | Freq: Once | INTRAVENOUS | Status: AC | PRN
  Administered 2019-10-01: 500 [IU]
  Filled 2019-10-01: qty 5

## 2019-10-01 MED ORDER — SODIUM CHLORIDE 0.9 % IV SOLN
Freq: Once | INTRAVENOUS | Status: AC
  Filled 2019-10-01: qty 250

## 2019-10-01 NOTE — Assessment & Plan Note (Signed)
She has reasonable pain control She will continue current prescribed morphine sulfate and methadone as needed We discussed narcotic refill policy 

## 2019-10-01 NOTE — Assessment & Plan Note (Signed)
She has stable CT imaging It is likely that the soft tissue thickening along the pelvic sidewall will remain the same indefinitely, fibrotic changes cannot be excluded She tolerated treatment very well Due to the recurrent disease status, I recommend she stays on pembrolizumab indefinitely and she is in agreement with the plan of care The stability of her CT imaging, I plan to space out the interval imaging studies, the next one would be January 2022.  I will continue to see her every other treatment for toxicity review

## 2019-10-01 NOTE — Assessment & Plan Note (Signed)
She was found to have iron deficiency anemia and borderline vitamin B12 deficiency After receiving intravenous iron infusion, her blood counts are stable We will continue to monitor closely She is not symptomatic We will proceed without delay 

## 2019-10-01 NOTE — Patient Instructions (Signed)
[facility] Discharge Instructions for Patients Receiving Chemotherapy  Today you received the following chemotherapy agents: pembrolizumab.  To help prevent nausea and vomiting after your treatment, we encourage you to take your nausea medication as directed.   If you develop nausea and vomiting that is not controlled by your nausea medication, call the clinic.   BELOW ARE SYMPTOMS THAT SHOULD BE REPORTED IMMEDIATELY:  *FEVER GREATER THAN 100.5 F  *CHILLS WITH OR WITHOUT FEVER  NAUSEA AND VOMITING THAT IS NOT CONTROLLED WITH YOUR NAUSEA MEDICATION  *UNUSUAL SHORTNESS OF BREATH  *UNUSUAL BRUISING OR BLEEDING  TENDERNESS IN MOUTH AND THROAT WITH OR WITHOUT PRESENCE OF ULCERS  *URINARY PROBLEMS  *BOWEL PROBLEMS  UNUSUAL RASH Items with * indicate a potential emergency and should be followed up as soon as possible.  Feel free to call the clinic should you have any questions or concerns. The clinic phone number is [phone].  Please show the CHEMO ALERT CARD at check-in to the Emergency Department and triage nurse.   

## 2019-10-01 NOTE — Patient Instructions (Signed)

## 2019-10-01 NOTE — Progress Notes (Signed)
[facility] OFFICE PROGRESS NOTE  Patient Care Team: Avbuere, Edwin, MD as PCP - General (Internal Medicine)  ASSESSMENT & PLAN:  Uterine cancer (HCC) She has stable CT imaging It is likely that the soft tissue thickening along the pelvic sidewall will remain the same indefinitely, fibrotic changes cannot be excluded She tolerated treatment very well Due to the recurrent disease status, I recommend she stays on pembrolizumab indefinitely and she is in agreement with the plan of care The stability of her CT imaging, I plan to space out the interval imaging studies, the next one would be January 74.  I will continue to see her every other treatment for toxicity review  Cancer associated pain She has reasonable pain control She will continue current prescribed morphine sulfate and methadone as needed We discussed narcotic refill policy  Pancytopenia, acquired (HCC) She was found to have iron deficiency anemia and borderline vitamin B12 deficiency After receiving intravenous iron infusion, her blood counts are stable We will continue to monitor closely She is not symptomatic We will proceed without delay   No orders of the defined types were placed in this encounter.   All questions were answered. The patient knows to call the clinic with any problems, questions or concerns. The total time spent in the appointment was 20 minutes encounter with patients including review of chart and various tests results, discussions about plan of care and coordination of care plan   Ni Gorsuch, MD 10/01/2019 10:13 AM  INTERVAL HISTORY: Please see below for problem oriented charting. She returns for treatment and follow-up She tolerated treatment well without side effects Her pain is well controlled The patient denies any recent signs or symptoms of bleeding such as spontaneous epistaxis, hematuria or hematochezia. 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 Uterus +/- tubes/ovaries, neoplastic, with left fallopian tube and ovary - INVASIVE ENDOMETRIOID CARCINOMA (1.5 CM), FIGO GRADE II, ARISING IN A BACKGROUND OF ATYPICAL COMPLEX HYPERPLASIA, CONFINED WITHIN INNER HALF OF THE MYOMETRIUM. - ENDOMETRIAL POLYP WITH ASSOCIATED ATYPICAL COMPLEX HYPERPLASIA. - MYOMETRIUM: LEIOMYOMATA. - CERVIX: BENIGN SQUAMOUS MUCOSA AND ENDOCERVICAL MUCOSA, NO DYSPLASIA OR MALIGNANCY. - LEFT OVARY: BENIGN OVARIAN TISSUE WITH ENDOSALPINGOSIS, NO EVIDENCE OF ATYPIA OR MALIGNANCY. - LEFT FALLOPIAN TUBE: NO HISTOLOGIC ABNORMALITIES. - PLEASE SEE ONCOLOGY TEMPLATE FOR DETAIL. 2. Ovary and fallopian tube, right - BENIGN OVARIAN TISSUE WITH ENDOSALPINGOSIS, NO ATYPIA OR MALIGNANCY. - BENIGN FALLOPIAN TUBAL TISSUE, NO PATHOLOGIC ABNORMALITIES. Microscopic Comment 1. UTERUS Specimen: Uterus, cervix, bilateral ovaries and fallopian tubes Procedure: Total hysterectomy and bilateral salpingo-oophorectomy Lymph node sampling performed: No Specimen integrity: Intact Maximum tumor size (cm): 1.5 cm, glass slide measurement Histologic type: Invasive endometrioid carcinoma Grade: FIGO grade II Myometrial invasion: 1 cm where myometrium is 2.3 cm in thickness Cervical stromal involvement: No Extent of involvement of other organs: No Lymph vascular invasion: Not identified Peritoneal washings: Negative ([id number]) Lymph nodes: number examined N/A; number positive N/A TNM code: pT1a, pNX 1 oFf 3IGO Stage (based on pathologic findings, needs clinical correlation): IA  Comments: Sections the endomyometrium away from the grossly identified endometrial polyp show an invasive FIGO grade II endometrioid carcinoma. The tumor is confined within inner  half of the myometrium. No angiolymphatic invasion is identified. No cervical stromal involvement is   identified. Sections of the grossly identified endometrial polyp show an endometrial polyp with associated atypical compacted hyperplasia with no definitive evidence of carcinoma.   12/07/2017 Imaging   US venous Doppler Right: No evidence of common femoral vein obstruction. Left: Findings consistent with acute deep vein thrombosis involving the left femoral vein, left proximal profunda vein, and left popliteal vein. Unable to adequately interrogate the common femoral and higher, or the calf secondary to significant edema and body habitus   12/07/2017 -  Hospital Admission   She presented to the ER and was diagnosed with acute DVT   01/18/2018 - 01/21/2018 Hospital Admission   She was admitted to the hospital for management of severe persistent DVT   01/18/2018 Imaging   US venous Doppler Right: No evidence of common femoral vein obstruction. Left: Findings consistent with acute deep vein thrombosis involving the left common femoral vein, and left popliteal vein.   01/19/2018 Surgery   Pre-operative Diagnosis: Subacute DVT with severe post thrombotic syndrome Post-operative diagnosis:  Same Surgeon:  Brandon C. Cain, MD Procedure Performed: 1.  Ultrasound-guided cannulation left small saphenous vein 2.  Left lower extremity and central venography 3.  Intravascular ultrasound of left popliteal, femoral, common femoral, external and common iliac veins and IVC 4.  Stent of left common and external iliac veins with 14 x 60 mm Vici 5.  Moderate sedation with fentanyl and Versed for 50 minutes  Indications: 74-year-old female with a history of DVT in October now presents with persistent left lower extremity swelling and ultrasound demonstrating likely persistent DVT.  She has been on Xarelto at this time.  She is now indicated for venogram possible intervention.  Findings: Flow in the left  lower extremity was stagnant throughout but by venogram all veins were patent.  There was a focal occlusive area approximately 2 cm in length at the common and external iliac vein junction at the hypogastric on the left.  After stenting and ballooning we had a diameter of 12 millimeters in the stent and venogram demonstrated flow in the lower extremity veins were previously was stagnant and no further residual stenosis in the left common and external iliac vein junction.   04/12/2018 Imaging   US Venous Doppler Right: No evidence of common femoral vein obstruction. Left: There is no evidence of deep vein thrombosis in the lower extremity. However, portions of this examination were limited- see technologist comments above. Left groin: Large hypoechoic area with mixed echoes noted measuring nearly 10 cm. Possible  hematoma versus unknown etiology. Ultrasound characteristics of enlarged lymph nodes noted in the groin.      05/15/2018 Imaging   US Venous Doppler Right: No evidence of deep vein thrombosis in the lower extremity. No indirect evidence of obstruction proximal to the inguinal ligament. Left: No reflux was noted in the common femoral vein , femoral vein in the thigh, popliteal vein, great saphenous vein at the saphenofemoral junction, great saphenous vein at the proximal thigh, great saphenous vein at the mid thigh, great saphenous vein  at the distal thigh, great saphenous vein at the knee, origin of the small saphenous vein, proximal small saphenous vein, and mid small saphenous vein. There is no evidence of deep vein thrombosis in the lower extremity. There is no evidence of superficial venous thrombosis. No cystic structure found in the popliteal fossa. Unable to evaluate extension of common femoral vein obstruction proximal to the inguinal ligament.   06/01/2018 Imaging   1. Infiltrative mass within the left pelvic sidewall   measuring approximately 9.5 cm with associated pathologically  enlarged left inguinal lymph node. Additionally, there is lucency involving the medial sidewall of the left acetabulum with potential nondisplaced pathologic fracture. Further evaluation with contrast-enhanced pelvic MRI could be performed as clinically indicated. 2. The left pelvic arterial and venous system is encased by this infiltrative left pelvic sidewall mass however while difficult to ascertain, the left external iliac venous stent appears patent.   06/18/2018 Pathology Results   Lymph node for lymphoma, Left Inguinal - METASTATIC ADENOCARCINOMA, SEE COMMENT. Microscopic Comment Immunohistochemistry is positive for cytokeratin 7, PAX8, ER, and PR. Cytokeratin 5/6,and p63 are negative. The immunoprofile along with the patient's history are consistent with a gynecologic primary.   06/18/2018 Surgery   Pre-op Diagnosis: INGUINAL LYMPHADENOPATHY, PELVIC MASS     Procedure(s): EXCISIONAL BIOPSY DEEP LEFT INGUINAL LYMPH NODE  Surgeon(s): Coralie Keens, MD    06/24/2018 Cancer Staging   Staging form: Corpus Uteri - Carcinoma and Carcinosarcoma, AJCC 8th Edition - Clinical: Stage IVB (cT1a, cN2, pM1) - Signed by [name], MD on 06/24/2018    Genetic Testing   Patient has genetic testing done for MMR on pathology from 06/18/2018. Results revealed patient has the following mutation(s): MMR: abnormal   06/29/2018 Procedure   Placement of a subcutaneous port device. Catheter tip at the SVC and right atrium junction.    Genetic Testing   Patient has genetic testing done for MSI on pathology from 06/18/2018. Results revealed patient has the following mutation(s): MSI: High   07/02/2018 PET scan   Previous hysterectomy, with asymmetric focus of hypermetabolic activity in the left vaginal cuff, suspicious for residual or recurrent carcinoma.  Large hypermetabolic soft tissue mass involving the left pelvic sidewall and acetabulum, consistent with metastatic disease.  No evidence  metastatic disease within the abdomen, chest, or neck.   07/09/2018 Tumor Marker   Patient's tumor was tested for the following markers: CA-125 Results of the tumor marker test revealed 9   07/10/2018 - 08/24/2018 Chemotherapy   The patient had carboplatin and taxol x 3 cycles   07/17/2018 Genetic Testing   Negative genetic testing on the common hereditary cancer panel.  The Common Hereditary Gene Panel offered by [facility] includes sequencing and/or deletion duplication testing of the following 48 genes: APC, ATM, AXIN2, BARD1, BMPR1A, BRCA1, BRCA2, BRIP1, CDH1, CDK4, CDKN2A (p14ARF), CDKN2A (p16INK4a), CHEK2, CTNNA1, DICER1, EPCAM (Deletion/duplication testing only), GREM1 (promoter region deletion/duplication testing only), KIT, MEN1, MLH1, MSH2, MSH3, MSH6, MUTYH, NBN, NF1, NHTL1, PALB2, PDGFRA, PMS2, POLD1, POLE, PTEN, RAD50, RAD51C, RAD51D, RNF43, SDHB, SDHC, SDHD, SMAD4, SMARCA4. STK11, TP53, TSC1, TSC2, and VHL.  The following genes were evaluated for sequence changes only: SDHA and HOXB13 c.251G>A variant only. The report date is Jul 17, 2018.    10/03/2018 Imaging   CT abdomen and pelvis 1.  No acute intra-abdominal process. 2. Grossly unchanged left pelvic sidewall mass with osseous involvement of the medial acetabulum. Progressive mild displacement of the associated comminuted pathologic fracture involving the right acetabulum and puboacetabular junction.  3. New venous stents extending from the left common iliac vein origin to the proximal left common femoral vein. The stents are patent.   11/06/2018 -  Chemotherapy   The patient had pembrolizumab for chemotherapy treatment.     01/28/2019 Imaging   1. No substantial interval change in exam. 2. Interval development of mild fullness in the left intrarenal collecting system and ureter without overt hydronephrosis at this time. 3. Abnormal soft tissue along the left pelvic  sidewall has decreased slightly in the interval. 4. Similar  appearance of ill-defined fascial planes in the pelvis with some peritoneal thickening along the right pelvic sidewall and potentially involving the sigmoid mesocolon. 5. No substantial ascites.   05/03/2019 Imaging   1. Stable mild left pelvic sidewall soft tissue density. No new or progressive disease identified within the abdomen or pelvis.  2. Colonic diverticulosis. No radiographic evidence of diverticulitis.   Aortic Atherosclerosis (ICD10-I70.0).   09/09/2019 Imaging   1. No change in appearance of soft tissue thickening along the LEFT pelvic sidewall adjacent to chronic LEFT acetabular fracture. 2. Mild asymmetry of the bladder wall favoring the LEFT bladder wall, not well assessed. Similar accounting for variable degrees of distension on prior studies potentially related to prior radiation, attention on follow-up. 3. Signs of venous stenting in the LEFT hemipelvis with LEFT lower extremity muscular atrophy and mild stranding with similar appearance. Signs of colonic diverticulosis and diverticular disease without change.   Metastasis to lymph nodes (HCC)  06/23/2018 Initial Diagnosis   Metastasis to lymph nodes (HCC)   07/10/2018 - 09/14/2018 Chemotherapy   The patient had palonosetron (ALOXI) injection 0.25 mg, 0.25 mg, Intravenous,  Once, 3 of 6 cycles Administration: 0.25 mg (07/10/2018), 0.25 mg (07/31/2018), 0.25 mg (08/24/2018) CARBOplatin (PARAPLATIN) 480 mg in sodium chloride 0.9 % 250 mL chemo infusion, 480 mg (100 % of original dose 482.5 mg), Intravenous,  Once, 3 of 6 cycles Dose modification: 482.5 mg (original dose 482.5 mg, Cycle 1) Administration: 480 mg (07/10/2018), 480 mg (07/31/2018), 480 mg (08/24/2018) PACLitaxel (TAXOL) 276 mg in sodium chloride 0.9 % 250 mL chemo infusion (> 80mg/m2), 140 mg/m2 = 276 mg (80 % of original dose 175 mg/m2), Intravenous,  Once, 3 of 6 cycles Dose modification: 140 mg/m2 (80 % of original dose 175 mg/m2, Cycle 1, Reason: Dose Not  Tolerated) Administration: 276 mg (07/10/2018), 276 mg (07/31/2018), 276 mg (08/24/2018) fosaprepitant (EMEND) 150 mg, dexamethasone (DECADRON) 12 mg in sodium chloride 0.9 % 145 mL IVPB, , Intravenous,  Once, 3 of 6 cycles Administration:  (07/10/2018),  (07/31/2018),  (08/24/2018)  for chemotherapy treatment.    11/06/2018 -  Chemotherapy   The patient had pembrolizumab for chemotherapy treatment.     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 (HCC)  06/24/2018 Initial Diagnosis   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 (HCC)   07/10/2018 - 09/14/2018 Chemotherapy   The patient had palonosetron (ALOXI) injection 0.25 mg, 0.25 mg, Intravenous,  Once, 3 of 6 cycles Administration: 0.25 mg (07/10/2018), 0.25 mg (07/31/2018), 0.25 mg (08/24/2018) CARBOplatin (PARAPLATIN) 480 mg in sodium chloride 0.9 % 250 mL chemo infusion, 480 mg (100 % of original dose 482.5 mg), Intravenous,  Once, 3 of 6 cycles Dose modification: 482.5 mg (original dose 482.5 mg, Cycle 1) Administration: 480 mg (07/10/2018), 480 mg (07/31/2018), 480 mg (08/24/2018) PACLitaxel (TAXOL) 276 mg in sodium chloride 0.9 % 250 mL chemo infusion (> 80mg/m2), 140 mg/m2 = 276 mg (80 % of original dose 175 mg/m2), Intravenous,  Once, 3 of 6 cycles Dose modification: 140 mg/m2 (80 % of original dose 175 mg/m2, Cycle 1, Reason: Dose Not Tolerated) Administration: 276 mg (07/10/2018), 276 mg (07/31/2018), 276 mg (08/24/2018) fosaprepitant (EMEND) 150 mg, dexamethasone (DECADRON) 12 mg in sodium chloride 0.9 % 145 mL IVPB, , Intravenous,  Once, 3 of 6 cycles Administration:  (07/10/2018),  (07/31/2018),  (08/24/2018)  for chemotherapy treatment.    11/06/2018 -  Chemotherapy   The patient had pembrolizumab for chemotherapy treatment.     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       REVIEW OF SYSTEMS:   Constitutional: Denies fevers, chills or abnormal weight loss Eyes: Denies blurriness of vision Ears, nose, mouth, throat, and face: Denies mucositis or sore throat Respiratory: Denies cough, dyspnea or wheezes Cardiovascular: Denies palpitation, chest discomfort  Gastrointestinal:  Denies nausea, heartburn or change in bowel habits Skin: Denies abnormal skin rashes Lymphatics: Denies new lymphadenopathy or easy bruising Neurological:Denies numbness, tingling or new weaknesses Behavioral/Psych: Mood is stable, no new changes  All other systems were reviewed with the patient and are negative.  I have reviewed the past medical history, past surgical history, social history and family history with the patient and they are unchanged from previous note.  ALLERGIES:  has No Known Allergies.  MEDICATIONS:  Current Outpatient Medications  Medication Sig Dispense Refill  . clopidogrel (PLAVIX) 75 MG tablet Take 1 tablet (75 mg total) by mouth daily with breakfast. 30 tablet 1  . diclofenac sodium (VOLTAREN) 1 % GEL APPLY 4GRAMS 4 TIMES A DAY AS NEEDED FOR PAINS    . Eliquis DVT/PE Starter Pack (ELIQUIS STARTER PACK) 5 MG TABS Take as directed on package: start with two-36m tablets twice daily for 7 days. On day 8, switch to one-542mtablet twice daily. 1 each 0  . gabapentin (NEURONTIN) 300 MG capsule Take 300 mg by mouth 2 (two) times daily.    . methadone (DOLOPHINE) 10 MG tablet Take 1 tablet (10 mg total) by mouth every 12 (twelve) hours. 60 tablet 0  . morphine (MSIR) 15 MG tablet Take 1 tablet (15 mg total) by mouth every 6 (six) hours as needed for severe pain. 60 tablet 0  . Olopatadine HCl 0.2 % SOLN Place 1 drop into both eyes daily.     No current facility-administered medications for this visit.    PHYSICAL EXAMINATION: ECOG PERFORMANCE STATUS: 2 - Symptomatic, <50% confined to bed  Vitals:   10/01/19 1001  BP: 128/60   Pulse: 79  Resp: 18  Temp: 97.9 F (36.6 C)  SpO2: 100%   Filed Weights   10/01/19 1001  Weight: 193 lb (87.5 kg)    GENERAL:alert, no distress and comfortable SKIN: skin color, texture, turgor are normal, no rashes or significant lesions EYES: normal, Conjunctiva are pink and non-injected, sclera clear OROPHARYNX:no exudate, no erythema and lips, buccal mucosa, and tongue normal  NECK: supple, thyroid normal size, non-tender, without nodularity LYMPH:  no palpable lymphadenopathy in the cervical, axillary or inguinal LUNGS: clear to auscultation and percussion with normal breathing effort HEART: regular rate & rhythm and no murmurs with stable lower extremity edema ABDOMEN:abdomen soft, non-tender and normal bowel sounds Musculoskeletal:no cyanosis of digits and no clubbing  NEURO: alert & oriented x 3 with fluent speech, no focal motor/sensory deficits  LABORATORY DATA:  I have reviewed the data as listed    Component Value Date/Time   NA 140 09/10/2019 1026   K 3.7 09/10/2019 1026   CL 107 09/10/2019 1026   CO2 25 09/10/2019 1026   GLUCOSE 87 09/10/2019 1026   BUN 12 09/10/2019 1026   CREATININE 0.70 09/10/2019 1026   CALCIUM 9.5 09/10/2019 1026   PROT 7.0 09/10/2019 1026   ALBUMIN 3.7 09/10/2019 1026   AST 12 (L) 09/10/2019 1026   ALT  6 09/10/2019 1026   ALKPHOS 108 09/10/2019 1026   BILITOT 0.7 09/10/2019 1026   GFRNONAA >60 09/10/2019 1026   GFRAA >60 09/10/2019 1026    No results found for: SPEP, UPEP  Lab Results  Component Value Date   WBC 3.2 (L) 10/01/2019   NEUTROABS 2.1 10/01/2019   HGB 11.7 (L) 10/01/2019   HCT 35.5 (L) 10/01/2019   MCV 92.9 10/01/2019   PLT 182 10/01/2019      Chemistry      Component Value Date/Time   NA 140 09/10/2019 1026   K 3.7 09/10/2019 1026   CL 107 09/10/2019 1026   CO2 25 09/10/2019 1026   BUN 12 09/10/2019 1026   CREATININE 0.70 09/10/2019 1026      Component Value Date/Time   CALCIUM 9.5 09/10/2019 1026    ALKPHOS 108 09/10/2019 1026   AST 12 (L) 09/10/2019 1026   ALT 6 09/10/2019 1026   BILITOT 0.7 09/10/2019 1026      

## 2019-10-22 ENCOUNTER — Inpatient Hospital Stay: Payer: Medicare Other | Attending: Hematology and Oncology

## 2019-10-22 ENCOUNTER — Inpatient Hospital Stay: Payer: Medicare Other

## 2019-10-22 ENCOUNTER — Inpatient Hospital Stay (HOSPITAL_BASED_OUTPATIENT_CLINIC_OR_DEPARTMENT_OTHER): Payer: Medicare Other | Admitting: Hematology and Oncology

## 2019-10-22 ENCOUNTER — Other Ambulatory Visit: Payer: Self-pay

## 2019-10-22 DIAGNOSIS — D539 Nutritional anemia, unspecified: Secondary | ICD-10-CM | POA: Diagnosis not present

## 2019-10-22 DIAGNOSIS — Z9221 Personal history of antineoplastic chemotherapy: Secondary | ICD-10-CM | POA: Insufficient documentation

## 2019-10-22 DIAGNOSIS — Z86718 Personal history of other venous thrombosis and embolism: Secondary | ICD-10-CM | POA: Diagnosis not present

## 2019-10-22 DIAGNOSIS — C779 Secondary and unspecified malignant neoplasm of lymph node, unspecified: Secondary | ICD-10-CM | POA: Diagnosis not present

## 2019-10-22 DIAGNOSIS — C801 Malignant (primary) neoplasm, unspecified: Secondary | ICD-10-CM

## 2019-10-22 DIAGNOSIS — G893 Neoplasm related pain (acute) (chronic): Secondary | ICD-10-CM | POA: Insufficient documentation

## 2019-10-22 DIAGNOSIS — Z7901 Long term (current) use of anticoagulants: Secondary | ICD-10-CM | POA: Diagnosis not present

## 2019-10-22 DIAGNOSIS — D61818 Other pancytopenia: Secondary | ICD-10-CM | POA: Diagnosis not present

## 2019-10-22 DIAGNOSIS — I700 Atherosclerosis of aorta: Secondary | ICD-10-CM | POA: Insufficient documentation

## 2019-10-22 DIAGNOSIS — C774 Secondary and unspecified malignant neoplasm of inguinal and lower limb lymph nodes: Secondary | ICD-10-CM

## 2019-10-22 DIAGNOSIS — C55 Malignant neoplasm of uterus, part unspecified: Secondary | ICD-10-CM

## 2019-10-22 DIAGNOSIS — C7951 Secondary malignant neoplasm of bone: Secondary | ICD-10-CM

## 2019-10-22 DIAGNOSIS — D509 Iron deficiency anemia, unspecified: Secondary | ICD-10-CM | POA: Diagnosis not present

## 2019-10-22 DIAGNOSIS — Z23 Encounter for immunization: Secondary | ICD-10-CM | POA: Insufficient documentation

## 2019-10-22 DIAGNOSIS — Z79899 Other long term (current) drug therapy: Secondary | ICD-10-CM | POA: Insufficient documentation

## 2019-10-22 DIAGNOSIS — Z7189 Other specified counseling: Secondary | ICD-10-CM

## 2019-10-22 DIAGNOSIS — M79662 Pain in left lower leg: Secondary | ICD-10-CM | POA: Diagnosis not present

## 2019-10-22 DIAGNOSIS — Z5112 Encounter for antineoplastic immunotherapy: Secondary | ICD-10-CM | POA: Diagnosis not present

## 2019-10-22 DIAGNOSIS — C541 Malignant neoplasm of endometrium: Secondary | ICD-10-CM | POA: Insufficient documentation

## 2019-10-22 LAB — CBC WITH DIFFERENTIAL (CANCER CENTER ONLY)
Abs Immature Granulocytes: 0.01 10*3/uL (ref 0.00–0.07)
Basophils Absolute: 0 10*3/uL (ref 0.0–0.1)
Basophils Relative: 1 %
Eosinophils Absolute: 0.1 10*3/uL (ref 0.0–0.5)
Eosinophils Relative: 4 %
HCT: 35.7 % — ABNORMAL LOW (ref 36.0–46.0)
Hemoglobin: 11.8 g/dL — ABNORMAL LOW (ref 12.0–15.0)
Immature Granulocytes: 0 %
Lymphocytes Relative: 23 %
Lymphs Abs: 0.7 10*3/uL (ref 0.7–4.0)
MCH: 30 pg (ref 26.0–34.0)
MCHC: 33.1 g/dL (ref 30.0–36.0)
MCV: 90.8 fL (ref 80.0–100.0)
Monocytes Absolute: 0.3 10*3/uL (ref 0.1–1.0)
Monocytes Relative: 11 %
Neutro Abs: 1.8 10*3/uL (ref 1.7–7.7)
Neutrophils Relative %: 61 %
Platelet Count: 191 10*3/uL (ref 150–400)
RBC: 3.93 MIL/uL (ref 3.87–5.11)
RDW: 12.7 % (ref 11.5–15.5)
WBC Count: 3 10*3/uL — ABNORMAL LOW (ref 4.0–10.5)
nRBC: 0 % (ref 0.0–0.2)

## 2019-10-22 LAB — CMP (CANCER CENTER ONLY)
ALT: <6 U/L (ref 0–44)
AST: 9 U/L — ABNORMAL LOW (ref 15–41)
Albumin: 3.7 g/dL (ref 3.5–5.0)
Alkaline Phosphatase: 121 U/L (ref 38–126)
Anion gap: 6 (ref 5–15)
BUN: 13 mg/dL (ref 8–23)
CO2: 25 mmol/L (ref 22–32)
Calcium: 9.4 mg/dL (ref 8.9–10.3)
Chloride: 107 mmol/L (ref 98–111)
Creatinine: 0.7 mg/dL (ref 0.44–1.00)
GFR, Est AFR Am: >60 mL/min (ref >60)
GFR, Estimated: >60 mL/min (ref >60)
Glucose, Bld: 95 mg/dL (ref 70–99)
Potassium: 3.9 mmol/L (ref 3.5–5.1)
Sodium: 138 mmol/L (ref 135–145)
Total Bilirubin: 0.6 mg/dL (ref 0.3–1.2)
Total Protein: 7.2 g/dL (ref 6.5–8.1)

## 2019-10-22 LAB — TSH: TSH: 1.98 u[IU]/mL (ref 0.308–3.960)

## 2019-10-22 MED ORDER — SODIUM CHLORIDE 0.9% FLUSH
10.0000 mL | Freq: Once | INTRAVENOUS | Status: AC
  Administered 2019-10-22: 10 mL
  Filled 2019-10-22: qty 10

## 2019-10-22 MED ORDER — SODIUM CHLORIDE 0.9 % IV SOLN
200.0000 mg | Freq: Once | INTRAVENOUS | Status: AC
  Administered 2019-10-22: 200 mg via INTRAVENOUS
  Filled 2019-10-22: qty 8

## 2019-10-22 MED ORDER — HEPARIN SOD (PORK) LOCK FLUSH 100 UNIT/ML IV SOLN
500.0000 [IU] | Freq: Once | INTRAVENOUS | Status: AC | PRN
  Administered 2019-10-22: 500 [IU]
  Filled 2019-10-22: qty 5

## 2019-10-22 MED ORDER — SODIUM CHLORIDE 0.9% FLUSH
10.0000 mL | INTRAVENOUS | Status: DC | PRN
  Administered 2019-10-22: 10 mL
  Filled 2019-10-22: qty 10

## 2019-10-22 MED ORDER — SODIUM CHLORIDE 0.9 % IV SOLN
Freq: Once | INTRAVENOUS | Status: AC
  Filled 2019-10-22: qty 250

## 2019-10-24 ENCOUNTER — Encounter: Payer: Self-pay | Admitting: Hematology and Oncology

## 2019-10-24 NOTE — Assessment & Plan Note (Signed)
She was found to have iron deficiency anemia and borderline vitamin B12 deficiency After receiving intravenous iron infusion, her blood counts are stable We will continue to monitor closely She is not symptomatic We will proceed without delay 

## 2019-10-24 NOTE — Assessment & Plan Note (Signed)
She has stable CT imaging It is likely that the soft tissue thickening along the pelvic sidewall will remain the same indefinitely, fibrotic changes cannot be excluded She tolerated treatment very well Due to the recurrent disease status, I recommend she stays on pembrolizumab indefinitely and she is in agreement with the plan of care The stability of her CT imaging, I plan to space out the interval imaging studies, the next one would be January 2022.  I will continue to see her every other treatment for toxicity review

## 2019-10-24 NOTE — Assessment & Plan Note (Signed)
She has reasonable pain control She will continue current prescribed morphine sulfate and methadone as needed We discussed narcotic refill policy 

## 2019-10-24 NOTE — Progress Notes (Signed)
[place] OFFICE PROGRESS NOTE  Patient Care Team: Nolene Ebbs, MD as PCP - General (Internal Medicine)  ASSESSMENT & PLAN:  Uterine cancer [facility]) She has stable CT imaging It is likely that the soft tissue thickening along the pelvic sidewall will remain the same indefinitely, fibrotic changes cannot be excluded She tolerated treatment very well Due to the recurrent disease status, I recommend she stays on pembrolizumab indefinitely and she is in agreement with the plan of care The stability of her CT imaging, I plan to space out the interval imaging studies, the next one would be January 2022.  I will continue to see her every other treatment for toxicity review  Cancer associated pain She has reasonable pain control She will continue current prescribed morphine sulfate and methadone as needed We discussed narcotic refill policy  Deficiency anemia She was found to have iron deficiency anemia and borderline vitamin B12 deficiency After receiving intravenous iron infusion, her blood counts are stable We will continue to monitor closely She is not symptomatic We will proceed without delay   No orders of the defined types were placed in this encounter.   All questions were answered. The patient knows to call the clinic with any problems, questions or concerns. The total time spent in the appointment was 20 minutes encounter with patients including review of chart and various tests results, discussions about plan of care and coordination of care plan   [name], MD 10/24/2019 12:15 PM  INTERVAL HISTORY: Please see below for problem oriented charting. 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 Uterus +/- tubes/ovaries, neoplastic, with left fallopian tube and ovary - INVASIVE ENDOMETRIOID CARCINOMA (1.5 CM), FIGO GRADE II, ARISING IN A BACKGROUND OF ATYPICAL COMPLEX HYPERPLASIA, CONFINED WITHIN INNER HALF OF THE MYOMETRIUM. - ENDOMETRIAL POLYP WITH ASSOCIATED ATYPICAL COMPLEX HYPERPLASIA. - MYOMETRIUM: LEIOMYOMATA. - CERVIX: BENIGN SQUAMOUS MUCOSA AND ENDOCERVICAL MUCOSA, NO DYSPLASIA OR MALIGNANCY. - LEFT OVARY: BENIGN OVARIAN TISSUE WITH ENDOSALPINGOSIS, NO EVIDENCE OF ATYPIA OR MALIGNANCY. - LEFT FALLOPIAN TUBE: NO HISTOLOGIC ABNORMALITIES. - PLEASE SEE ONCOLOGY TEMPLATE FOR DETAIL. 2. Ovary and fallopian tube, right - BENIGN OVARIAN TISSUE WITH ENDOSALPINGOSIS, NO ATYPIA OR MALIGNANCY. - BENIGN FALLOPIAN TUBAL TISSUE, NO PATHOLOGIC ABNORMALITIES. Microscopic Comment 1. UTERUS Specimen: Uterus, cervix, bilateral ovaries and fallopian tubes Procedure: Total hysterectomy and bilateral salpingo-oophorectomy Lymph node sampling performed: No Specimen integrity: Intact Maximum tumor size (cm): 1.5 cm, glass slide measurement Histologic type: Invasive endometrioid carcinoma Grade: FIGO grade II Myometrial invasion: 1 cm where myometrium is 2.3 cm in thickness Cervical stromal involvement: No Extent of involvement of other organs: No Lymph vascular invasion: Not identified Peritoneal washings: Negative ([id number]) Lymph nodes: number examined N/A; number positive N/A TNM code: pT1a, pNX 1 oFf 3IGO Stage (based on pathologic findings, needs clinical correlation): IA  Comments: Sections the endomyometrium away from the grossly identified endometrial polyp show an invasive FIGO grade II endometrioid carcinoma. The tumor is confined within inner half of the myometrium. No angiolymphatic invasion is identified. No cervical stromal involvement is identified.  Sections of the grossly identified endometrial polyp show an endometrial polyp with associated atypical compacted  hyperplasia with no definitive evidence of carcinoma.   12/07/2017 Imaging   US venous Doppler Right: No evidence of common femoral vein obstruction. Left: Findings consistent with acute deep vein thrombosis involving the left femoral vein, left proximal profunda vein, and left popliteal vein. Unable to adequately interrogate the common femoral and higher, or the calf secondary to significant edema and body habitus   12/07/2017 [facility] Admission   She presented to the ER and was diagnosed with acute DVT   01/18/2018 - 01/21/2018 Hospital Admission   She was admitted to the hospital for management of severe persistent DVT   01/18/2018 Imaging   US venous Doppler Right: No evidence of common femoral vein obstruction. Left: Findings consistent with acute deep vein thrombosis involving the left common femoral vein, and left popliteal vein.   01/19/2018 Surgery   Pre-operative Diagnosis: Subacute DVT with severe post thrombotic syndrome Post-operative diagnosis:  Same Surgeon:  Erlene Quan C. Donzetta Matters, MD Procedure Performed: 1.  Ultrasound-guided cannulation left small saphenous vein 2.  Left lower extremity and central venography 3.  Intravascular ultrasound of left popliteal, femoral, common femoral, external and common iliac veins and IVC 4.  Stent of left common and external iliac veins with 14 x 60 mm Vici 5.  Moderate sedation with fentanyl and Versed for 50 minutes  Indications: 74 year old female with a history of DVT in October now presents with persistent left lower extremity swelling and ultrasound demonstrating likely persistent DVT.  She has been on Xarelto at this time.  She is now indicated for venogram possible intervention.  Findings: Flow in the left lower extremity was stagnant throughout but by venogram all veins were patent.  There was a focal occlusive area  approximately 2 cm in length at the common and external iliac vein junction at the hypogastric on the left.  After stenting and ballooning we had a diameter of 12 millimeters in the stent and venogram demonstrated flow in the lower extremity veins were previously was stagnant and no further residual stenosis in the left common and external iliac vein junction.   04/12/2018 Imaging   US Venous Doppler Right: No evidence of common femoral vein obstruction. Left: There is no evidence of deep vein thrombosis in the lower extremity. However, portions of this examination were limited- see technologist comments above. Left groin: Large hypoechoic area with mixed echoes noted measuring nearly 10 cm. Possible  hematoma versus unknown etiology. Ultrasound characteristics of enlarged lymph nodes noted in the groin.      05/15/2018 Imaging   US Venous Doppler Right: No evidence of deep vein thrombosis in the lower extremity. No indirect evidence of obstruction proximal to the inguinal ligament. Left: No reflux was noted in the common femoral vein , femoral vein in the thigh, popliteal vein, great saphenous vein at the saphenofemoral junction, great saphenous vein at the proximal thigh, great saphenous vein at the mid thigh, great saphenous vein  at the distal thigh, great saphenous vein at the knee, origin of the small saphenous vein, proximal small saphenous vein, and mid small saphenous vein. There is no evidence of deep vein thrombosis in the lower extremity. There is no evidence of superficial venous thrombosis. No cystic structure found in the popliteal fossa. Unable to evaluate extension of common femoral vein obstruction proximal to the inguinal ligament.   06/01/2018 Imaging   1. 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 Additionally, there is lucency  involving the medial sidewall of the left acetabulum  with potential nondisplaced pathologic fracture. Further evaluation with contrast-enhanced pelvic MRI could be performed as clinically indicated. 2. The left pelvic arterial and venous system is encased by this infiltrative left pelvic sidewall mass however while difficult to ascertain, the left external iliac venous stent appears patent.   06/18/2018 Pathology Results   Lymph node for lymphoma, Left Inguinal - METASTATIC ADENOCARCINOMA, SEE COMMENT. Microscopic Comment Immunohistochemistry is positive for cytokeratin 7, PAX8, ER, and PR. Cytokeratin 5/6,and p63 are negative. The immunoprofile along with the patient's history are consistent with a gynecologic primary.   06/18/2018 Surgery   Pre-op Diagnosis: INGUINAL LYMPHADENOPATHY, PELVIC MASS     Procedure(s): EXCISIONAL BIOPSY DEEP LEFT INGUINAL LYMPH NODE  Surgeon(s): Coralie Keens, MD    06/24/2018 Cancer Staging   Staging form: Corpus Uteri - Carcinoma and Carcinosarcoma, AJCC 8th Edition - Clinical: Stage IVB (cT1a, cN2, pM1) - Signed by [name], MD on 06/24/2018    Genetic Testing   Patient has genetic testing done for MMR on pathology from 06/18/2018. Results revealed patient has the following mutation(s): MMR: abnormal   06/29/2018 Procedure   Placement of a subcutaneous port device. Catheter tip at the SVC and right atrium junction.    Genetic Testing   Patient has genetic testing done for MSI on pathology from 06/18/2018. Results revealed patient has the following mutation(s): MSI: High   07/02/2018 PET scan   Previous hysterectomy, with asymmetric focus of hypermetabolic activity in the left vaginal cuff, suspicious for residual or recurrent carcinoma.  Large hypermetabolic soft tissue mass involving the left pelvic sidewall and acetabulum, consistent with metastatic disease.  No evidence metastatic disease within the abdomen, chest, or neck.   07/09/2018 Tumor Marker   Patient's tumor was tested for the  following markers: CA-125 Results of the tumor marker test revealed 9   07/10/2018 - 08/24/2018 Chemotherapy   The patient had carboplatin and taxol x 3 cycles   07/17/2018 Genetic Testing   Negative genetic testing on the common hereditary cancer panel.  The Common Hereditary Gene Panel offered by [facility] includes sequencing and/or deletion duplication testing of the following 48 genes: APC, ATM, AXIN2, BARD1, BMPR1A, BRCA1, BRCA2, BRIP1, CDH1, CDK4, CDKN2A (p14ARF), CDKN2A (p16INK4a), CHEK2, CTNNA1, DICER1, EPCAM (Deletion/duplication testing only), GREM1 (promoter region deletion/duplication testing only), KIT, MEN1, MLH1, MSH2, MSH3, MSH6, MUTYH, NBN, NF1, NHTL1, PALB2, PDGFRA, PMS2, POLD1, POLE, PTEN, RAD50, RAD51C, RAD51D, RNF43, SDHB, SDHC, SDHD, SMAD4, SMARCA4. STK11, TP53, TSC1, TSC2, and VHL.  The following genes were evaluated for sequence changes only: SDHA and HOXB13 c.251G>A variant only. The report date is Jul 17, 2018.    10/03/2018 Imaging   CT abdomen and pelvis 1.  No acute intra-abdominal process. 2. Grossly unchanged left pelvic sidewall mass with osseous involvement of the medial acetabulum. Progressive mild displacement of the associated comminuted pathologic fracture involving the right acetabulum and puboacetabular junction.  3. New venous stents extending from the left common iliac vein origin to the proximal left common femoral vein. The stents are patent.   11/06/2018 -  Chemotherapy   The patient had pembrolizumab for chemotherapy treatment.     01/28/2019 Imaging   1. No substantial interval change in exam. 2. Interval development of mild fullness in the left intrarenal collecting system and ureter without overt hydronephrosis at this time. 3. Abnormal soft tissue along the left pelvic sidewall has decreased slightly in the interval. 4. Similar appearance of ill-defined fascial planes in the  pelvis with some peritoneal thickening along the right pelvic sidewall and  potentially involving the sigmoid mesocolon. 5. No substantial ascites.   05/03/2019 Imaging   1. Stable mild left pelvic sidewall soft tissue density. No new or progressive disease identified within the abdomen or pelvis.  2. Colonic diverticulosis. No radiographic evidence of diverticulitis.   Aortic Atherosclerosis (ICD10-I70.0).   09/09/2019 Imaging   1. No change in appearance of soft tissue thickening along the LEFT pelvic sidewall adjacent to chronic LEFT acetabular fracture. 2. Mild asymmetry of the bladder wall favoring the LEFT bladder wall, not well assessed. Similar accounting for variable degrees of distension on prior studies potentially related to prior radiation, attention on follow-up. 3. Signs of venous stenting in the LEFT hemipelvis with LEFT lower extremity muscular atrophy and mild stranding with similar appearance. Signs of colonic diverticulosis and diverticular disease without change.   Metastasis to lymph nodes ([place])  06/23/2018 Initial Diagnosis   Metastasis to lymph nodes ([place])   07/10/2018 - 09/14/2018 Chemotherapy   The patient had palonosetron (ALOXI) injection 0.25 mg, 0.25 mg, Intravenous,  Once, 3 of 6 cycles Administration: 0.25 mg (07/10/2018), 0.25 mg (07/31/2018), 0.25 mg (08/24/2018) CARBOplatin (PARAPLATIN) 480 mg in sodium chloride 0.9 % 250 mL chemo infusion, 480 mg (100 % of original dose 482.5 mg), Intravenous,  Once, 3 of 6 cycles Dose modification: 482.5 mg (original dose 482.5 mg, Cycle 1) Administration: 480 mg (07/10/2018), 480 mg (07/31/2018), 480 mg (08/24/2018) PACLitaxel (TAXOL) 276 mg in sodium chloride 0.9 % 250 mL chemo infusion (> 24m/m2), 140 mg/m2 = 276 mg (80 % of original dose 175 mg/m2), Intravenous,  Once, 3 of 6 cycles Dose modification: 140 mg/m2 (80 % of original dose 175 mg/m2, Cycle 1, Reason: Dose Not Tolerated) Administration: 276 mg (07/10/2018), 276 mg (07/31/2018), 276 mg (08/24/2018) fosaprepitant (EMEND) 150 mg, dexamethasone  (DECADRON) 12 mg in sodium chloride 0.9 % 145 mL IVPB, , Intravenous,  Once, 3 of 6 cycles Administration:  (07/10/2018),  (07/31/2018),  (08/24/2018)  for chemotherapy treatment.    11/06/2018 -  Chemotherapy   The patient had pembrolizumab for chemotherapy treatment.     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  06/24/2018 Initial Diagnosis   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   07/10/2018 - 09/14/2018 Chemotherapy   The patient had palonosetron (ALOXI) injection 0.25 mg, 0.25 mg, Intravenous,  Once, 3 of 6 cycles Administration: 0.25 mg (07/10/2018), 0.25 mg (07/31/2018), 0.25 mg (08/24/2018) CARBOplatin (PARAPLATIN) 480 mg in sodium chloride 0.9 % 250 mL chemo infusion, 480 mg (100 % of original dose 482.5 mg), Intravenous,  Once, 3 of 6 cycles Dose modification: 482.5 mg (original dose 482.5 mg, Cycle 1) Administration: 480 mg (07/10/2018), 480 mg (07/31/2018), 480 mg (08/24/2018) PACLitaxel (TAXOL) 276 mg in sodium chloride 0.9 % 250 mL chemo infusion (> 846mm2), 140 mg/m2 = 276 mg (80 % of original dose 175 mg/m2), Intravenous,  Once, 3 of 6 cycles Dose modification: 140 mg/m2 (80 % of original dose 175 mg/m2, Cycle 1, Reason: Dose Not Tolerated) Administration: 276 mg (07/10/2018), 276 mg (07/31/2018), 276 mg (08/24/2018) fosaprepitant (EMEND) 150 mg, dexamethasone (DECADRON) 12 mg in sodium chloride 0.9 % 145 mL IVPB, , Intravenous,  Once, 3 of 6 cycles Administration:  (07/10/2018),  (07/31/2018),  (08/24/2018)  for chemotherapy treatment.    11/06/2018 -  Chemotherapy   The patient had pembrolizumab for chemotherapy treatment.     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       REVIEW OF SYSTEMS:   Constitutional: Denies fevers, chills or abnormal weight loss Eyes:  Denies blurriness of vision Ears, nose, mouth, throat, and face: Denies mucositis or sore throat Respiratory: Denies cough, dyspnea or wheezes Cardiovascular: Denies palpitation, chest discomfort or lower extremity swelling Gastrointestinal:  Denies nausea, heartburn or change in bowel habits Skin: Denies abnormal skin rashes Lymphatics: Denies new lymphadenopathy or easy bruising Neurological:Denies numbness, tingling or new weaknesses Behavioral/Psych: Mood is stable, no new changes  All other systems were reviewed with the patient and are negative.  I have reviewed the past medical history, past surgical history, social history and family history with the patient and they are unchanged from previous note.  ALLERGIES:  has No Known Allergies.  MEDICATIONS:  Current Outpatient Medications  Medication Sig Dispense Refill  . clopidogrel (PLAVIX) 75 MG tablet Take 1 tablet (75 mg total) by mouth daily with breakfast. 30 tablet 1  . diclofenac sodium (VOLTAREN) 1 % GEL APPLY 4GRAMS 4 TIMES A DAY AS NEEDED FOR PAINS    . Eliquis DVT/PE Starter Pack (ELIQUIS STARTER PACK) 5 MG TABS Take as directed on package: start with two-30m tablets twice daily for 7 days. On day 8, switch to one-530mtablet twice daily. 1 each 0  . gabapentin (NEURONTIN) 300 MG capsule Take 300 mg by mouth 2 (two) times daily.    . methadone (DOLOPHINE) 10 MG tablet Take 1 tablet (10 mg total) by mouth every 12 (twelve) hours. 60 tablet 0  . morphine (MSIR) 15 MG tablet Take 1 tablet (15 mg total) by mouth every 6 (six) hours as needed for severe pain. 60 tablet 0  . Olopatadine HCl 0.2 % SOLN Place 1 drop into both eyes daily.     No current facility-administered medications for this visit.    PHYSICAL EXAMINATION: ECOG PERFORMANCE STATUS: 2 - Symptomatic, <50% confined to bed  Vitals:   10/22/19 1051  BP: (!) 123/51  Pulse: 74  Resp: 18  Temp: (!) 97.3 F (36.3 C)  SpO2: 100%   Filed Weights   10/22/19 1051   Weight: 206 lb 3.2 oz (93.5 kg)    GENERAL:alert, no distress and comfortable SKIN: skin color, texture, turgor are normal, no rashes or significant lesions EYES: normal, Conjunctiva are pink and non-injected, sclera clear OROPHARYNX:no exudate, no erythema and lips, buccal mucosa, and tongue normal  NECK: supple, thyroid normal size, non-tender, without nodularity LYMPH:  no palpable lymphadenopathy in the cervical, axillary or inguinal LUNGS: clear to auscultation and percussion with normal breathing effort HEART: regular rate & rhythm and no murmurs with mild bilateral lower extremity edema ABDOMEN:abdomen soft, non-tender and normal bowel sounds Musculoskeletal:no cyanosis of digits and no clubbing  NEURO: alert & oriented x 3 with fluent speech, no focal motor/sensory deficits  LABORATORY DATA:  I have reviewed the data as listed    Component Value Date/Time   NA 138 10/22/2019 1034   K 3.9 10/22/2019 1034   CL 107 10/22/2019 1034   CO2 25 10/22/2019 1034   GLUCOSE 95 10/22/2019 1034   BUN 13 10/22/2019 1034   CREATININE 0.70 10/22/2019 1034   CALCIUM 9.4 10/22/2019 1034   PROT 7.2 10/22/2019 1034   ALBUMIN 3.7 10/22/2019 1034   AST 9 (L) 10/22/2019 1034   ALT <6 10/22/2019 1034   ALKPHOS 121 10/22/2019  1034   BILITOT 0.6 10/22/2019 1034   GFRNONAA >60 10/22/2019 1034   GFRAA >60 10/22/2019 1034    No results found for: SPEP, UPEP  Lab Results  Component Value Date   WBC 3.0 (L) 10/22/2019   NEUTROABS 1.8 10/22/2019   HGB 11.8 (L) 10/22/2019   HCT 35.7 (L) 10/22/2019   MCV 90.8 10/22/2019   PLT 191 10/22/2019      Chemistry      Component Value Date/Time   NA 138 10/22/2019 1034   K 3.9 10/22/2019 1034   CL 107 10/22/2019 1034   CO2 25 10/22/2019 1034   BUN 13 10/22/2019 1034   CREATININE 0.70 10/22/2019 1034      Component Value Date/Time   CALCIUM 9.4 10/22/2019 1034   ALKPHOS 121 10/22/2019 1034   AST 9 (L) 10/22/2019 1034   ALT <6 10/22/2019  1034   BILITOT 0.6 10/22/2019 1034

## 2019-11-12 ENCOUNTER — Inpatient Hospital Stay: Payer: Medicare Other

## 2019-11-12 ENCOUNTER — Inpatient Hospital Stay (HOSPITAL_BASED_OUTPATIENT_CLINIC_OR_DEPARTMENT_OTHER): Payer: Medicare Other | Admitting: Hematology and Oncology

## 2019-11-12 ENCOUNTER — Other Ambulatory Visit: Payer: Self-pay

## 2019-11-12 ENCOUNTER — Encounter: Payer: Self-pay | Admitting: Hematology and Oncology

## 2019-11-12 DIAGNOSIS — Z7189 Other specified counseling: Secondary | ICD-10-CM

## 2019-11-12 DIAGNOSIS — G893 Neoplasm related pain (acute) (chronic): Secondary | ICD-10-CM | POA: Diagnosis not present

## 2019-11-12 DIAGNOSIS — Z299 Encounter for prophylactic measures, unspecified: Secondary | ICD-10-CM | POA: Insufficient documentation

## 2019-11-12 DIAGNOSIS — C7951 Secondary malignant neoplasm of bone: Secondary | ICD-10-CM

## 2019-11-12 DIAGNOSIS — C55 Malignant neoplasm of uterus, part unspecified: Secondary | ICD-10-CM | POA: Diagnosis not present

## 2019-11-12 DIAGNOSIS — C801 Malignant (primary) neoplasm, unspecified: Secondary | ICD-10-CM

## 2019-11-12 DIAGNOSIS — I825Z2 Chronic embolism and thrombosis of unspecified deep veins of left distal lower extremity: Secondary | ICD-10-CM

## 2019-11-12 DIAGNOSIS — Z23 Encounter for immunization: Secondary | ICD-10-CM

## 2019-11-12 DIAGNOSIS — C541 Malignant neoplasm of endometrium: Secondary | ICD-10-CM | POA: Diagnosis not present

## 2019-11-12 DIAGNOSIS — C774 Secondary and unspecified malignant neoplasm of inguinal and lower limb lymph nodes: Secondary | ICD-10-CM

## 2019-11-12 DIAGNOSIS — D61818 Other pancytopenia: Secondary | ICD-10-CM | POA: Diagnosis not present

## 2019-11-12 LAB — TSH: TSH: 1.405 u[IU]/mL (ref 0.308–3.960)

## 2019-11-12 LAB — CBC WITH DIFFERENTIAL (CANCER CENTER ONLY)
Abs Immature Granulocytes: 0.01 10*3/uL (ref 0.00–0.07)
Basophils Absolute: 0 10*3/uL (ref 0.0–0.1)
Basophils Relative: 0 %
Eosinophils Absolute: 0.2 10*3/uL (ref 0.0–0.5)
Eosinophils Relative: 5 %
HCT: 35.4 % — ABNORMAL LOW (ref 36.0–46.0)
Hemoglobin: 11.6 g/dL — ABNORMAL LOW (ref 12.0–15.0)
Immature Granulocytes: 0 %
Lymphocytes Relative: 24 %
Lymphs Abs: 0.8 10*3/uL (ref 0.7–4.0)
MCH: 30.2 pg (ref 26.0–34.0)
MCHC: 32.8 g/dL (ref 30.0–36.0)
MCV: 92.2 fL (ref 80.0–100.0)
Monocytes Absolute: 0.4 10*3/uL (ref 0.1–1.0)
Monocytes Relative: 12 %
Neutro Abs: 2 10*3/uL (ref 1.7–7.7)
Neutrophils Relative %: 59 %
Platelet Count: 190 10*3/uL (ref 150–400)
RBC: 3.84 MIL/uL — ABNORMAL LOW (ref 3.87–5.11)
RDW: 13.1 % (ref 11.5–15.5)
WBC Count: 3.4 10*3/uL — ABNORMAL LOW (ref 4.0–10.5)
nRBC: 0 % (ref 0.0–0.2)

## 2019-11-12 LAB — CMP (CANCER CENTER ONLY)
ALT: 9 U/L (ref 0–44)
AST: 13 U/L — ABNORMAL LOW (ref 15–41)
Albumin: 3.7 g/dL (ref 3.5–5.0)
Alkaline Phosphatase: 121 U/L (ref 38–126)
Anion gap: 4 — ABNORMAL LOW (ref 5–15)
BUN: 22 mg/dL (ref 8–23)
CO2: 27 mmol/L (ref 22–32)
Calcium: 9.4 mg/dL (ref 8.9–10.3)
Chloride: 105 mmol/L (ref 98–111)
Creatinine: 0.73 mg/dL (ref 0.44–1.00)
GFR, Est AFR Am: >60 mL/min (ref >60)
GFR, Estimated: >60 mL/min (ref >60)
Glucose, Bld: 79 mg/dL (ref 70–99)
Potassium: 3.9 mmol/L (ref 3.5–5.1)
Sodium: 136 mmol/L (ref 135–145)
Total Bilirubin: 0.7 mg/dL (ref 0.3–1.2)
Total Protein: 7.2 g/dL (ref 6.5–8.1)

## 2019-11-12 MED ORDER — HEPARIN SOD (PORK) LOCK FLUSH 100 UNIT/ML IV SOLN
500.0000 [IU] | Freq: Once | INTRAVENOUS | Status: AC | PRN
  Administered 2019-11-12: 500 [IU]
  Filled 2019-11-12: qty 5

## 2019-11-12 MED ORDER — SODIUM CHLORIDE 0.9 % IV SOLN
200.0000 mg | Freq: Once | INTRAVENOUS | Status: AC
  Administered 2019-11-12: 200 mg via INTRAVENOUS
  Filled 2019-11-12: qty 8

## 2019-11-12 MED ORDER — SODIUM CHLORIDE 0.9 % IV SOLN
Freq: Once | INTRAVENOUS | Status: AC
  Filled 2019-11-12: qty 250

## 2019-11-12 MED ORDER — SODIUM CHLORIDE 0.9% FLUSH
10.0000 mL | Freq: Once | INTRAVENOUS | Status: AC
  Administered 2019-11-12: 10 mL
  Filled 2019-11-12: qty 10

## 2019-11-12 MED ORDER — SODIUM CHLORIDE 0.9% FLUSH
10.0000 mL | INTRAVENOUS | Status: DC | PRN
  Administered 2019-11-12: 10 mL
  Filled 2019-11-12: qty 10

## 2019-11-12 NOTE — Assessment & Plan Note (Signed)
She was found to have iron deficiency anemia and borderline vitamin B12 deficiency After receiving intravenous iron infusion, her blood counts are stable We will continue to monitor closely She is not symptomatic We will proceed without delay 

## 2019-11-12 NOTE — Assessment & Plan Note (Signed)
She has reasonable pain control She will continue current prescribed morphine sulfate and methadone as needed We discussed narcotic refill policy 

## 2019-11-12 NOTE — Patient Instructions (Signed)

## 2019-11-12 NOTE — Assessment & Plan Note (Signed)
She has stable CT imaging It is likely that the soft tissue thickening along the pelvic sidewall will remain the same indefinitely, fibrotic changes cannot be excluded She tolerated treatment very well Due to the recurrent disease status, I recommend she stays on pembrolizumab indefinitely and she is in agreement with the plan of care The stability of her CT imaging, I plan to space out the interval imaging studies, the next one would be January 2022.  I will continue to see her every other treatment for toxicity review

## 2019-11-12 NOTE — Progress Notes (Signed)
[place] OFFICE PROGRESS NOTE  Patient Care Team: Nolene Ebbs, MD as PCP - General (Internal Medicine)  ASSESSMENT & PLAN:  Uterine cancer [facility]) She has stable CT imaging It is likely that the soft tissue thickening along the pelvic sidewall will remain the same indefinitely, fibrotic changes cannot be excluded She tolerated treatment very well Due to the recurrent disease status, I recommend she stays on pembrolizumab indefinitely and she is in agreement with the plan of care The stability of her CT imaging, I plan to space out the interval imaging studies, the next one would be January 2022.  I will continue to see her every other treatment for toxicity review  Pancytopenia, acquired ([place]) She was found to have iron deficiency anemia and borderline vitamin B12 deficiency After receiving intravenous iron infusion, her blood counts are stable We will continue to monitor closely She is not symptomatic We will proceed without delay  Cancer associated pain She has reasonable pain control She will continue current prescribed morphine sulfate and methadone as needed We discussed narcotic refill policy  Lower leg DVT (deep venous thromboembolism), chronic, left ([place]) She will continue anticoagulation therapy indefinitely  Preventive measure We discussed the importance of preventive care and reviewed the vaccination programs. She does not have any prior allergic reactions to Covid-19 vaccination. She agrees to proceed with booster dose of Covid-19 vaccination today and we will administer it today at the clinic.    No orders of the defined types were placed in this encounter.   All questions were answered. The patient knows to call the clinic with any problems, questions or concerns. The total time spent in the appointment was 20 minutes encounter with patients including review of chart and various tests results, discussions about plan of care and coordination of  care plan   [name], MD 11/12/2019 1:28 PM  INTERVAL HISTORY: Please see below for problem oriented charting. She returns for treatment and follow-up She tolerated treatment very well Her chronic pain is stable Her leg swelling is stable The patient denies any recent signs or symptoms of bleeding such as spontaneous epistaxis, hematuria or hematochezia. 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 Uterus +/- tubes/ovaries, neoplastic, with left fallopian tube and ovary - INVASIVE ENDOMETRIOID CARCINOMA (1.5 CM), FIGO GRADE II, ARISING IN A BACKGROUND OF ATYPICAL COMPLEX HYPERPLASIA, CONFINED WITHIN INNER HALF OF THE MYOMETRIUM. - ENDOMETRIAL POLYP WITH ASSOCIATED ATYPICAL COMPLEX HYPERPLASIA. - MYOMETRIUM: LEIOMYOMATA. - CERVIX: BENIGN SQUAMOUS MUCOSA AND ENDOCERVICAL MUCOSA, NO DYSPLASIA OR MALIGNANCY. - LEFT OVARY: BENIGN OVARIAN TISSUE WITH ENDOSALPINGOSIS, NO EVIDENCE OF ATYPIA OR MALIGNANCY. - LEFT FALLOPIAN TUBE: NO HISTOLOGIC ABNORMALITIES. - PLEASE SEE ONCOLOGY TEMPLATE FOR DETAIL. 2. Ovary and fallopian tube, right - BENIGN OVARIAN TISSUE WITH ENDOSALPINGOSIS, NO ATYPIA OR MALIGNANCY. - BENIGN FALLOPIAN TUBAL TISSUE, NO PATHOLOGIC ABNORMALITIES. Microscopic Comment 1. UTERUS Specimen: Uterus, cervix, bilateral ovaries and fallopian tubes Procedure: Total hysterectomy and bilateral salpingo-oophorectomy Lymph node sampling performed: No Specimen integrity: Intact Maximum tumor size (cm): 1.5 cm, glass slide measurement Histologic type: Invasive endometrioid carcinoma Grade: FIGO grade II Myometrial invasion: 1 cm where myometrium is 2.3 cm in thickness Cervical stromal involvement: No Extent of  involvement of other organs: No Lymph vascular invasion: Not  identified Peritoneal washings: Negative ([id number]) Lymph nodes: number examined N/A; number positive N/A TNM code: pT1a, pNX 1 oFf 3IGO Stage (based on pathologic findings, needs clinical correlation): IA  Comments: Sections the endomyometrium away from the grossly identified endometrial polyp show an invasive FIGO grade II endometrioid carcinoma. The tumor is confined within inner half of the myometrium. No angiolymphatic invasion is identified. No cervical stromal involvement is identified. Sections of the grossly identified endometrial polyp show an endometrial polyp with associated atypical compacted hyperplasia with no definitive evidence of carcinoma.   12/07/2017 Imaging   US venous Doppler Right: No evidence of common femoral vein obstruction. Left: Findings consistent with acute deep vein thrombosis involving the left femoral vein, left proximal profunda vein, and left popliteal vein. Unable to adequately interrogate the common femoral and higher, or the calf secondary to significant edema and body habitus   12/07/2017 [facility] Admission   She presented to the ER and was diagnosed with acute DVT   01/18/2018 - 01/21/2018 Hospital Admission   She was admitted to the hospital for management of severe persistent DVT   01/18/2018 Imaging   US venous Doppler Right: No evidence of common femoral vein obstruction. Left: Findings consistent with acute deep vein thrombosis involving the left common femoral vein, and left popliteal vein.   01/19/2018 Surgery   Pre-operative Diagnosis: Subacute DVT with severe post thrombotic syndrome Post-operative diagnosis:  Same Surgeon:  Erlene Quan C. Donzetta Matters, MD Procedure Performed: 1.  Ultrasound-guided cannulation left small saphenous vein 2.  Left lower extremity and central venography 3.  Intravascular ultrasound of left popliteal, femoral, common femoral, external and common iliac  veins and IVC 4.  Stent of left common and external iliac veins with 14 x 60 mm Vici 5.  Moderate sedation with fentanyl and Versed for 50 minutes  Indications: 74 year old female with a history of DVT in October now presents with persistent left lower extremity swelling and ultrasound demonstrating likely persistent DVT.  She has been on Xarelto at this time.  She is now indicated for venogram possible intervention.  Findings: Flow in the left lower extremity was stagnant throughout but by venogram all veins were patent.  There was a focal occlusive area approximately 2 cm in length at the common and external iliac vein junction at the hypogastric on the left.  After stenting and ballooning we had a diameter of 12 millimeters in the stent and venogram demonstrated flow in the lower extremity veins were previously was stagnant and no further residual stenosis in the left common and external iliac vein junction.   04/12/2018 Imaging   US Venous Doppler Right: No evidence of common femoral vein obstruction. Left: There is no evidence of deep vein thrombosis in the lower extremity. However, portions of this examination were limited- see technologist comments above. Left groin: Large hypoechoic area with mixed echoes noted measuring nearly 10 cm. Possible  hematoma versus unknown etiology. Ultrasound characteristics of enlarged lymph nodes noted in the groin.      05/15/2018 Imaging   US Venous Doppler Right: No evidence of deep vein thrombosis in the lower extremity. No indirect evidence of obstruction proximal to the inguinal ligament. Left: No reflux was noted in the common femoral vein , femoral vein in the thigh, popliteal vein, great saphenous vein at the saphenofemoral junction, great saphenous vein at the proximal thigh, great saphenous vein at the mid thigh, great saphenous vein  at the distal thigh, great saphenous vein at the knee, origin of  the small saphenous vein, proximal small  saphenous vein, and mid small saphenous vein. There is no evidence of deep vein thrombosis in the lower extremity. There is no evidence of superficial venous thrombosis. No cystic structure found in the popliteal fossa. Unable to evaluate extension of common femoral vein obstruction proximal to the inguinal ligament.   06/01/2018 Imaging   1. 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 Additionally, there is lucency involving the medial sidewall of the left acetabulum with potential nondisplaced pathologic fracture. Further evaluation with contrast-enhanced pelvic MRI could be performed as clinically indicated. 2. The left pelvic arterial and venous system is encased by this infiltrative left pelvic sidewall mass however while difficult to ascertain, the left external iliac venous stent appears patent.   06/18/2018 Pathology Results   Lymph node for lymphoma, Left Inguinal - METASTATIC ADENOCARCINOMA, SEE COMMENT. Microscopic Comment Immunohistochemistry is positive for cytokeratin 7, PAX8, ER, and PR. Cytokeratin 5/6,and p63 are negative. The immunoprofile along with the patient's history are consistent with a gynecologic primary.   06/18/2018 Surgery   Pre-op Diagnosis: INGUINAL LYMPHADENOPATHY, PELVIC MASS     Procedure(s): EXCISIONAL BIOPSY DEEP LEFT INGUINAL LYMPH NODE  Surgeon(s): Coralie Keens, MD    06/24/2018 Cancer Staging   Staging form: Corpus Uteri - Carcinoma and Carcinosarcoma, AJCC 8th Edition - Clinical: Stage IVB (cT1a, cN2, pM1) - Signed by [name], MD on 06/24/2018    Genetic Testing   Patient has genetic testing done for MMR on pathology from 06/18/2018. Results revealed patient has the following mutation(s): MMR: abnormal   06/29/2018 Procedure   Placement of a subcutaneous port device. Catheter tip at the SVC and right atrium junction.    Genetic Testing    Patient has genetic testing done for MSI on pathology from 06/18/2018. Results revealed patient has the following mutation(s): MSI: High   07/02/2018 PET scan   Previous hysterectomy, with asymmetric focus of hypermetabolic activity in the left vaginal cuff, suspicious for residual or recurrent carcinoma.  Large hypermetabolic soft tissue mass involving the left pelvic sidewall and acetabulum, consistent with metastatic disease.  No evidence metastatic disease within the abdomen, chest, or neck.   07/09/2018 Tumor Marker   Patient's tumor was tested for the following markers: CA-125 Results of the tumor marker test revealed 9   07/10/2018 - 08/24/2018 Chemotherapy   The patient had carboplatin and taxol x 3 cycles   07/17/2018 Genetic Testing   Negative genetic testing on the common hereditary cancer panel.  The Common Hereditary Gene Panel offered by [facility] includes sequencing and/or deletion duplication testing of the following 48 genes: APC, ATM, AXIN2, BARD1, BMPR1A, BRCA1, BRCA2, BRIP1, CDH1, CDK4, CDKN2A (p14ARF), CDKN2A (p16INK4a), CHEK2, CTNNA1, DICER1, EPCAM (Deletion/duplication testing only), GREM1 (promoter region deletion/duplication testing only), KIT, MEN1, MLH1, MSH2, MSH3, MSH6, MUTYH, NBN, NF1, NHTL1, PALB2, PDGFRA, PMS2, POLD1, POLE, PTEN, RAD50, RAD51C, RAD51D, RNF43, SDHB, SDHC, SDHD, SMAD4, SMARCA4. STK11, TP53, TSC1, TSC2, and VHL.  The following genes were evaluated for sequence changes only: SDHA and HOXB13 c.251G>A variant only. The report date is Jul 17, 2018.    10/03/2018 Imaging   CT abdomen and pelvis 1.  No acute intra-abdominal process. 2. Grossly unchanged left pelvic sidewall mass with osseous involvement of the medial acetabulum. Progressive mild displacement of the associated comminuted pathologic fracture involving the right acetabulum and puboacetabular junction.  3. New venous stents extending from the left common iliac vein origin  to the proximal left  common femoral vein. The stents are patent.   11/06/2018 -  Chemotherapy   The patient had pembrolizumab for chemotherapy treatment.     01/28/2019 Imaging   1. No substantial interval change in exam. 2. Interval development of mild fullness in the left intrarenal collecting system and ureter without overt hydronephrosis at this time. 3. Abnormal soft tissue along the left pelvic sidewall has decreased slightly in the interval. 4. Similar appearance of ill-defined fascial planes in the pelvis with some peritoneal thickening along the right pelvic sidewall and potentially involving the sigmoid mesocolon. 5. No substantial ascites.   05/03/2019 Imaging   1. Stable mild left pelvic sidewall soft tissue density. No new or progressive disease identified within the abdomen or pelvis.  2. Colonic diverticulosis. No radiographic evidence of diverticulitis.   Aortic Atherosclerosis (ICD10-I70.0).   09/09/2019 Imaging   1. No change in appearance of soft tissue thickening along the LEFT pelvic sidewall adjacent to chronic LEFT acetabular fracture. 2. Mild asymmetry of the bladder wall favoring the LEFT bladder wall, not well assessed. Similar accounting for variable degrees of distension on prior studies potentially related to prior radiation, attention on follow-up. 3. Signs of venous stenting in the LEFT hemipelvis with LEFT lower extremity muscular atrophy and mild stranding with similar appearance. Signs of colonic diverticulosis and diverticular disease without change.   Metastasis to lymph nodes ([place])  06/23/2018 Initial Diagnosis   Metastasis to lymph nodes ([place])   07/10/2018 - 09/14/2018 Chemotherapy   The patient had palonosetron (ALOXI) injection 0.25 mg, 0.25 mg, Intravenous,  Once, 3 of 6 cycles Administration: 0.25 mg (07/10/2018), 0.25 mg (07/31/2018), 0.25 mg (08/24/2018) CARBOplatin (PARAPLATIN) 480 mg in sodium chloride 0.9 % 250 mL chemo infusion, 480 mg (100 % of original dose 482.5 mg),  Intravenous,  Once, 3 of 6 cycles Dose modification: 482.5 mg (original dose 482.5 mg, Cycle 1) Administration: 480 mg (07/10/2018), 480 mg (07/31/2018), 480 mg (08/24/2018) PACLitaxel (TAXOL) 276 mg in sodium chloride 0.9 % 250 mL chemo infusion (> 16m/m2), 140 mg/m2 = 276 mg (80 % of original dose 175 mg/m2), Intravenous,  Once, 3 of 6 cycles Dose modification: 140 mg/m2 (80 % of original dose 175 mg/m2, Cycle 1, Reason: Dose Not Tolerated) Administration: 276 mg (07/10/2018), 276 mg (07/31/2018), 276 mg (08/24/2018) fosaprepitant (EMEND) 150 mg, dexamethasone (DECADRON) 12 mg in sodium chloride 0.9 % 145 mL IVPB, , Intravenous,  Once, 3 of 6 cycles Administration:  (07/10/2018),  (07/31/2018),  (08/24/2018)  for chemotherapy treatment.    11/06/2018 -  Chemotherapy   The patient had pembrolizumab for chemotherapy treatment.     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 ([place]  06/24/2018 Initial Diagnosis   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 ([place]   07/10/2018 - 09/14/2018 Chemotherapy   The patient had palonosetron (ALOXI) injection 0.25 mg, 0.25 mg, Intravenous,  Once, 3 of 6 cycles Administration: 0.25 mg (07/10/2018), 0.25 mg (07/31/2018), 0.25 mg (08/24/2018) CARBOplatin (PARAPLATIN) 480 mg in sodium chloride 0.9 % 250 mL chemo infusion, 480 mg (100 % of original dose 482.5 mg), Intravenous,  Once, 3 of 6 cycles Dose modification: 482.5 mg (original dose 482.5 mg, Cycle 1) Administration: 480 mg (07/10/2018), 480 mg (07/31/2018), 480 mg (08/24/2018) PACLitaxel (TAXOL) 276 mg in sodium chloride 0.9 % 250 mL chemo infusion (> 875mm2), 140 mg/m2 = 276 mg (80 % of original dose 175 mg/m2), Intravenous,  Once, 3 of 6 cycles Dose modification: 140 mg/m2 (80 % of original dose 175 mg/m2, Cycle 1, Reason:  Dose Not Tolerated) Administration: 276 mg (07/10/2018), 276 mg (07/31/2018), 276 mg (08/24/2018) fosaprepitant (EMEND) 150 mg, dexamethasone (DECADRON) 12 mg in sodium chloride 0.9 % 145 mL IVPB, , Intravenous,  Once, 3 of 6 cycles Administration:   (07/10/2018),  (07/31/2018),  (08/24/2018)  for chemotherapy treatment.    11/06/2018 -  Chemotherapy   The patient had pembrolizumab for chemotherapy treatment.     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       REVIEW OF SYSTEMS:   Constitutional: Denies fevers, chills or abnormal weight loss Eyes: Denies blurriness of vision Ears, nose, mouth, throat, and face: Denies mucositis or sore throat Respiratory: Denies cough, dyspnea or wheezes Cardiovascular: Denies palpitation, chest discomfort  Gastrointestinal:  Denies nausea, heartburn or change in bowel habits Skin: Denies abnormal skin rashes Lymphatics: Denies new lymphadenopathy or easy bruising Neurological:Denies numbness, tingling or new weaknesses Behavioral/Psych: Mood is stable, no new changes  All other systems were reviewed with the patient and are negative.  I have reviewed the past medical history, past surgical history, social history and family history with the patient and they are unchanged from previous note.  ALLERGIES:  has No Known Allergies.  MEDICATIONS:  Current Outpatient Medications  Medication Sig Dispense Refill  . clopidogrel (PLAVIX) 75 MG tablet Take 1 tablet (75 mg total) by mouth daily with breakfast. 30 tablet 1  . diclofenac sodium (VOLTAREN) 1 % GEL APPLY 4GRAMS 4 TIMES A DAY AS NEEDED FOR PAINS    . Eliquis DVT/PE Starter Pack (ELIQUIS STARTER PACK) 5 MG TABS Take as directed on package: start with two-73m tablets twice daily for 7 days. On day 8, switch to one-565mtablet twice daily. 1 each 0  . gabapentin (NEURONTIN) 300 MG capsule Take 300 mg by mouth 2 (two) times daily.    . methadone (DOLOPHINE) 10 MG tablet Take 1 tablet (10 mg total) by  mouth every 12 (twelve) hours. 60 tablet 0  . morphine (MSIR) 15 MG tablet Take 1 tablet (15 mg total) by mouth every 6 (six) hours as needed for severe pain. 60 tablet 0  . Olopatadine HCl 0.2 % SOLN Place 1 drop into both eyes daily.     No current facility-administered medications for this visit.    PHYSICAL EXAMINATION: ECOG PERFORMANCE STATUS: 2 - Symptomatic, <50% confined to bed  Vitals:   11/12/19 1313  BP: (!) 128/57  Pulse: 79  Resp: 18  Temp: (!) 97.2 F (36.2 C)  SpO2: 100%   Filed Weights   11/12/19 1313  Weight: 210 lb (95.3 kg)    GENERAL:alert, no distress and comfortable SKIN: skin color, texture, turgor are normal, no rashes or significant lesions EYES: normal, Conjunctiva are pink and non-injected, sclera clear OROPHARYNX:no exudate, no erythema and lips, buccal mucosa, and tongue normal  NECK: supple, thyroid normal size, non-tender, without nodularity LYMPH:  no palpable lymphadenopathy in the cervical, axillary or inguinal LUNGS: clear to auscultation and percussion with normal breathing effort HEART: regular rate & rhythm and no murmurs with stable lower extremity edema ABDOMEN:abdomen soft, non-tender and normal bowel sounds Musculoskeletal:no cyanosis of digits and no clubbing  NEURO: alert & oriented x 3 with fluent speech, no focal motor/sensory deficits  LABORATORY DATA:  I have reviewed the data as listed    Component Value  Date/Time   NA 138 10/22/2019 1034   K 3.9 10/22/2019 1034   CL 107 10/22/2019 1034   CO2 25 10/22/2019 1034   GLUCOSE 95 10/22/2019 1034   BUN 13 10/22/2019 1034   CREATININE 0.70 10/22/2019 1034   CALCIUM 9.4 10/22/2019 1034   PROT 7.2 10/22/2019 1034   ALBUMIN 3.7 10/22/2019 1034   AST 9 (L) 10/22/2019 1034   ALT <6 10/22/2019 1034   ALKPHOS 121 10/22/2019 1034   BILITOT 0.6 10/22/2019 1034   GFRNONAA >60 10/22/2019 1034   GFRAA >60 10/22/2019 1034    No results found for: SPEP, UPEP  Lab Results   Component Value Date   WBC 3.4 (L) 11/12/2019   NEUTROABS 2.0 11/12/2019   HGB 11.6 (L) 11/12/2019   HCT 35.4 (L) 11/12/2019   MCV 92.2 11/12/2019   PLT 190 11/12/2019      Chemistry      Component Value Date/Time   NA 138 10/22/2019 1034   K 3.9 10/22/2019 1034   CL 107 10/22/2019 1034   CO2 25 10/22/2019 1034   BUN 13 10/22/2019 1034   CREATININE 0.70 10/22/2019 1034      Component Value Date/Time   CALCIUM 9.4 10/22/2019 1034   ALKPHOS 121 10/22/2019 1034   AST 9 (L) 10/22/2019 1034   ALT <6 10/22/2019 1034   BILITOT 0.6 10/22/2019 1034

## 2019-11-12 NOTE — Assessment & Plan Note (Signed)
We discussed the importance of preventive care and reviewed the vaccination programs. She does not have any prior allergic reactions to Covid-19 vaccination. She agrees to proceed with booster dose of Covid-19 vaccination today and we will administer it today at the clinic.  

## 2019-11-12 NOTE — Progress Notes (Signed)
   Covid-19 Vaccination Clinic  Name:  [name]    MRN: [id number] DOB: 06-07-1945  11/12/2019  [name] was observed post Covid-19 immunization for 15 minutes without incident. She was provided with Vaccine Information Sheet and instruction to access the V-Safe system.   [name] was instructed to call 911 with any severe reactions post vaccine: Marland Kitchen Difficulty breathing  . Swelling of face and throat  . A fast heartbeat  . A bad rash all over body  . Dizziness and weakness

## 2019-11-12 NOTE — Assessment & Plan Note (Signed)
She will continue anticoagulation therapy indefinitely 

## 2019-12-03 ENCOUNTER — Inpatient Hospital Stay: Payer: Medicare Other | Attending: Hematology and Oncology

## 2019-12-03 ENCOUNTER — Inpatient Hospital Stay: Payer: Medicare Other

## 2019-12-03 ENCOUNTER — Other Ambulatory Visit: Payer: Self-pay

## 2019-12-03 ENCOUNTER — Inpatient Hospital Stay (HOSPITAL_BASED_OUTPATIENT_CLINIC_OR_DEPARTMENT_OTHER): Payer: Medicare Other | Admitting: Hematology and Oncology

## 2019-12-03 ENCOUNTER — Encounter: Payer: Self-pay | Admitting: Hematology and Oncology

## 2019-12-03 DIAGNOSIS — Z23 Encounter for immunization: Secondary | ICD-10-CM | POA: Insufficient documentation

## 2019-12-03 DIAGNOSIS — G893 Neoplasm related pain (acute) (chronic): Secondary | ICD-10-CM | POA: Diagnosis not present

## 2019-12-03 DIAGNOSIS — D509 Iron deficiency anemia, unspecified: Secondary | ICD-10-CM | POA: Insufficient documentation

## 2019-12-03 DIAGNOSIS — C7951 Secondary malignant neoplasm of bone: Secondary | ICD-10-CM | POA: Insufficient documentation

## 2019-12-03 DIAGNOSIS — Z86718 Personal history of other venous thrombosis and embolism: Secondary | ICD-10-CM | POA: Diagnosis not present

## 2019-12-03 DIAGNOSIS — C55 Malignant neoplasm of uterus, part unspecified: Secondary | ICD-10-CM

## 2019-12-03 DIAGNOSIS — Z7901 Long term (current) use of anticoagulants: Secondary | ICD-10-CM | POA: Diagnosis not present

## 2019-12-03 DIAGNOSIS — C801 Malignant (primary) neoplasm, unspecified: Secondary | ICD-10-CM

## 2019-12-03 DIAGNOSIS — I825Z2 Chronic embolism and thrombosis of unspecified deep veins of left distal lower extremity: Secondary | ICD-10-CM | POA: Diagnosis not present

## 2019-12-03 DIAGNOSIS — C541 Malignant neoplasm of endometrium: Secondary | ICD-10-CM | POA: Insufficient documentation

## 2019-12-03 DIAGNOSIS — Z5112 Encounter for antineoplastic immunotherapy: Secondary | ICD-10-CM | POA: Insufficient documentation

## 2019-12-03 DIAGNOSIS — Z7189 Other specified counseling: Secondary | ICD-10-CM

## 2019-12-03 DIAGNOSIS — C774 Secondary and unspecified malignant neoplasm of inguinal and lower limb lymph nodes: Secondary | ICD-10-CM

## 2019-12-03 DIAGNOSIS — D61818 Other pancytopenia: Secondary | ICD-10-CM | POA: Insufficient documentation

## 2019-12-03 DIAGNOSIS — Z79899 Other long term (current) drug therapy: Secondary | ICD-10-CM | POA: Diagnosis not present

## 2019-12-03 DIAGNOSIS — Z9221 Personal history of antineoplastic chemotherapy: Secondary | ICD-10-CM | POA: Insufficient documentation

## 2019-12-03 LAB — CBC WITH DIFFERENTIAL (CANCER CENTER ONLY)
Abs Immature Granulocytes: 0.01 10*3/uL (ref 0.00–0.07)
Basophils Absolute: 0 10*3/uL (ref 0.0–0.1)
Basophils Relative: 1 %
Eosinophils Absolute: 0.1 10*3/uL (ref 0.0–0.5)
Eosinophils Relative: 3 %
HCT: 35.6 % — ABNORMAL LOW (ref 36.0–46.0)
Hemoglobin: 11.8 g/dL — ABNORMAL LOW (ref 12.0–15.0)
Immature Granulocytes: 0 %
Lymphocytes Relative: 21 %
Lymphs Abs: 0.7 10*3/uL (ref 0.7–4.0)
MCH: 30.3 pg (ref 26.0–34.0)
MCHC: 33.1 g/dL (ref 30.0–36.0)
MCV: 91.5 fL (ref 80.0–100.0)
Monocytes Absolute: 0.4 10*3/uL (ref 0.1–1.0)
Monocytes Relative: 12 %
Neutro Abs: 2.2 10*3/uL (ref 1.7–7.7)
Neutrophils Relative %: 63 %
Platelet Count: 196 10*3/uL (ref 150–400)
RBC: 3.89 MIL/uL (ref 3.87–5.11)
RDW: 13.1 % (ref 11.5–15.5)
WBC Count: 3.5 10*3/uL — ABNORMAL LOW (ref 4.0–10.5)
nRBC: 0 % (ref 0.0–0.2)

## 2019-12-03 LAB — CMP (CANCER CENTER ONLY)
ALT: 7 U/L (ref 0–44)
AST: 11 U/L — ABNORMAL LOW (ref 15–41)
Albumin: 3.7 g/dL (ref 3.5–5.0)
Alkaline Phosphatase: 124 U/L (ref 38–126)
Anion gap: 5 (ref 5–15)
BUN: 15 mg/dL (ref 8–23)
CO2: 28 mmol/L (ref 22–32)
Calcium: 9.6 mg/dL (ref 8.9–10.3)
Chloride: 105 mmol/L (ref 98–111)
Creatinine: 0.69 mg/dL (ref 0.44–1.00)
GFR, Estimated: >60 mL/min (ref >60)
Glucose, Bld: 95 mg/dL (ref 70–99)
Potassium: 3.7 mmol/L (ref 3.5–5.1)
Sodium: 138 mmol/L (ref 135–145)
Total Bilirubin: 0.7 mg/dL (ref 0.3–1.2)
Total Protein: 7.6 g/dL (ref 6.5–8.1)

## 2019-12-03 LAB — TSH: TSH: 2.039 u[IU]/mL (ref 0.308–3.960)

## 2019-12-03 MED ORDER — INFLUENZA VAC A&B SA ADJ QUAD 0.5 ML IM PRSY
0.5000 mL | PREFILLED_SYRINGE | Freq: Once | INTRAMUSCULAR | Status: AC
  Administered 2019-12-03: 0.5 mL via INTRAMUSCULAR

## 2019-12-03 MED ORDER — SODIUM CHLORIDE 0.9 % IV SOLN
200.0000 mg | Freq: Once | INTRAVENOUS | Status: AC
  Administered 2019-12-03: 200 mg via INTRAVENOUS
  Filled 2019-12-03: qty 8

## 2019-12-03 MED ORDER — SODIUM CHLORIDE 0.9 % IV SOLN
Freq: Once | INTRAVENOUS | Status: DC
  Filled 2019-12-03: qty 250

## 2019-12-03 MED ORDER — HEPARIN SOD (PORK) LOCK FLUSH 100 UNIT/ML IV SOLN
500.0000 [IU] | Freq: Once | INTRAVENOUS | Status: AC | PRN
  Administered 2019-12-03: 500 [IU]
  Filled 2019-12-03: qty 5

## 2019-12-03 MED ORDER — INFLUENZA VAC A&B SA ADJ QUAD 0.5 ML IM PRSY
PREFILLED_SYRINGE | INTRAMUSCULAR | Status: AC
  Filled 2019-12-03: qty 0.5

## 2019-12-03 MED ORDER — SODIUM CHLORIDE 0.9% FLUSH
10.0000 mL | INTRAVENOUS | Status: DC | PRN
  Administered 2019-12-03: 10 mL
  Filled 2019-12-03: qty 10

## 2019-12-03 NOTE — Assessment & Plan Note (Signed)
She has reasonable pain control She will continue current prescribed morphine sulfate and methadone as needed We discussed narcotic refill policy 

## 2019-12-03 NOTE — Assessment & Plan Note (Signed)
She will continue anticoagulation therapy indefinitely 

## 2019-12-03 NOTE — Patient Instructions (Addendum)
[facility] Discharge Instructions for Patients Receiving Chemotherapy  Today you received the following chemotherapy agents: keytruda  To help prevent nausea and vomiting after your treatment, we encourage you to take your nausea medication as directed.    If you develop nausea and vomiting that is not controlled by your nausea medication, call the clinic.   BELOW ARE SYMPTOMS THAT SHOULD BE REPORTED IMMEDIATELY:  *FEVER GREATER THAN 100.5 F  *CHILLS WITH OR WITHOUT FEVER  NAUSEA AND VOMITING THAT IS NOT CONTROLLED WITH YOUR NAUSEA MEDICATION  *UNUSUAL SHORTNESS OF BREATH  *UNUSUAL BRUISING OR BLEEDING  TENDERNESS IN MOUTH AND THROAT WITH OR WITHOUT PRESENCE OF ULCERS  *URINARY PROBLEMS  *BOWEL PROBLEMS  UNUSUAL RASH Items with * indicate a potential emergency and should be followed up as soon as possible.  Feel free to call the clinic should you have any questions or concerns. The clinic phone number is [phone].  Please show the CHEMO ALERT CARD at check-in to the Emergency Department and triage nurse.  Influenza Virus Vaccine injection What is this medicine? INFLUENZA VIRUS VACCINE (in floo EN zuh VAHY ruhs vak SEEN) helps to reduce the risk of getting influenza also known as the flu. The vaccine only helps protect you against some strains of the flu. This medicine may be used for other purposes; ask your health care provider or pharmacist if you have questions. COMMON BRAND NAME(S): Afluria, Afluria Quadrivalent, Agriflu, Alfuria, FLUAD, Fluarix, Fluarix Quadrivalent, Flublok, Flublok Quadrivalent, FLUCELVAX, FLUCELVAX Quadrivalent, Flulaval, Flulaval Quadrivalent, Fluvirin, Fluzone, Fluzone High-Dose, Fluzone Intradermal, Fluzone Quadrivalent What should I tell my health care provider before I take this medicine? They need to know if you have any of these conditions:  bleeding disorder like hemophilia  fever or infection  Guillain-Barre syndrome  or other neurological problems  immune system problems  infection with the human immunodeficiency virus (HIV) or AIDS  low blood platelet counts  multiple sclerosis  an unusual or allergic reaction to influenza virus vaccine, latex, other medicines, foods, dyes, or preservatives. Different brands of vaccines contain different allergens. Some may contain latex or eggs. Talk to your doctor about your allergies to make sure that you get the right vaccine.  pregnant or trying to get pregnant  breast-feeding How should I use this medicine? This vaccine is for injection into a muscle or under the skin. It is given by a health care professional. A copy of Vaccine Information Statements will be given before each vaccination. Read this sheet carefully each time. The sheet may change frequently. Talk to your healthcare provider to see which vaccines are right for you. Some vaccines should not be used in all age groups. Overdosage: If you think you have taken too much of this medicine contact a poison control center or emergency room at once. NOTE: This medicine is only for you. Do not share this medicine with others. What if I miss a dose? This does not apply. What may interact with this medicine?  chemotherapy or radiation therapy  medicines that lower your immune system like etanercept, anakinra, infliximab, and adalimumab  medicines that treat or prevent blood clots like warfarin  phenytoin  steroid medicines like prednisone or cortisone  theophylline  vaccines This list may not describe all possible interactions. Give your health care provider a list of all the medicines, herbs, non-prescription drugs, or dietary supplements you use. Also tell them if you smoke, drink alcohol, or use illegal drugs. Some items may interact with your medicine. What   should I watch for while using this medicine? Report any side effects that do not go away within 3 days to your doctor or health care  professional. Call your health care provider if any unusual symptoms occur within 6 weeks of receiving this vaccine. You may still catch the flu, but the illness is not usually as bad. You cannot get the flu from the vaccine. The vaccine will not protect against colds or other illnesses that may cause fever. The vaccine is needed every year. What side effects may I notice from receiving this medicine? Side effects that you should report to your doctor or health care professional as soon as possible:  allergic reactions like skin rash, itching or hives, swelling of the face, lips, or tongue Side effects that usually do not require medical attention (report to your doctor or health care professional if they continue or are bothersome):  fever  headache  muscle aches and pains  pain, tenderness, redness, or swelling at the injection site  tiredness This list may not describe all possible side effects. Call your doctor for medical advice about side effects. You may report side effects to FDA at 1-800-FDA-1088. Where should I keep my medicine? The vaccine will be given by a health care professional in a clinic, pharmacy, doctor's office, or other health care setting. You will not be given vaccine doses to store at home. NOTE: This sheet is a summary. It may not cover all possible information. If you have questions about this medicine, talk to your doctor, pharmacist, or health care provider.  2020 [employer] Standard (2017-12-30 08:45:43)

## 2019-12-03 NOTE — Assessment & Plan Note (Signed)
She was found to have iron deficiency anemia and borderline vitamin B12 deficiency After receiving intravenous iron infusion, her blood counts are stable We will continue to monitor closely She is not symptomatic We will proceed without delay 

## 2019-12-03 NOTE — Progress Notes (Signed)
[place] OFFICE PROGRESS NOTE  Patient Care Team: Nolene Ebbs, MD as PCP - General (Internal Medicine)  ASSESSMENT & PLAN:  Uterine cancer [facility]) She has stable CT imaging It is likely that the soft tissue thickening along the pelvic sidewall will remain the same indefinitely, fibrotic changes cannot be excluded She tolerated treatment very well Due to the recurrent disease status, I recommend she stays on pembrolizumab indefinitely and she is in agreement with the plan of care The stability of her CT imaging, I plan to space out the interval imaging studies, the next one would be January 2022.  I will continue to see her every other treatment for toxicity review  Pancytopenia, acquired ([place]) She was found to have iron deficiency anemia and borderline vitamin B12 deficiency After receiving intravenous iron infusion, her blood counts are stable We will continue to monitor closely She is not symptomatic We will proceed without delay  Cancer associated pain She has reasonable pain control She will continue current prescribed morphine sulfate and methadone as needed We discussed narcotic refill policy  Lower leg DVT (deep venous thromboembolism), chronic, left ([place]) She will continue anticoagulation therapy indefinitely   No orders of the defined types were placed in this encounter.   All questions were answered. The patient knows to call the clinic with any problems, questions or concerns. The total time spent in the appointment was 20 minutes encounter with patients including review of chart and various tests results, discussions about plan of care and coordination of care plan   [name], MD 12/03/2019 10:24 AM  INTERVAL HISTORY: Please see below for problem oriented charting. She returns for treatment and follow-up She is doing well Her chronic pain is stable Denies worsening lower extremity edema No infusion reactions The patient denies any recent  signs or symptoms of bleeding such as spontaneous epistaxis, hematuria or hematochezia.  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 Uterus +/- tubes/ovaries, neoplastic, with left fallopian tube and ovary - INVASIVE ENDOMETRIOID CARCINOMA (1.5 CM), FIGO GRADE II, ARISING IN A BACKGROUND OF ATYPICAL COMPLEX HYPERPLASIA, CONFINED WITHIN INNER HALF OF THE MYOMETRIUM. - ENDOMETRIAL POLYP WITH ASSOCIATED ATYPICAL COMPLEX HYPERPLASIA. - MYOMETRIUM: LEIOMYOMATA. - CERVIX: BENIGN SQUAMOUS MUCOSA AND ENDOCERVICAL MUCOSA, NO DYSPLASIA OR MALIGNANCY. - LEFT OVARY: BENIGN OVARIAN TISSUE WITH ENDOSALPINGOSIS, NO EVIDENCE OF ATYPIA OR MALIGNANCY. - LEFT FALLOPIAN TUBE: NO HISTOLOGIC ABNORMALITIES. - PLEASE SEE ONCOLOGY TEMPLATE FOR DETAIL. 2. Ovary and fallopian tube, right - BENIGN OVARIAN TISSUE WITH ENDOSALPINGOSIS, NO ATYPIA OR MALIGNANCY. - BENIGN FALLOPIAN TUBAL TISSUE, NO PATHOLOGIC ABNORMALITIES. Microscopic Comment 1. UTERUS Specimen: Uterus, cervix, bilateral ovaries and fallopian tubes Procedure: Total hysterectomy and bilateral salpingo-oophorectomy Lymph node sampling performed: No Specimen integrity: Intact Maximum tumor size (cm): 1.5 cm, glass slide measurement Histologic type: Invasive endometrioid carcinoma Grade: FIGO grade II Myometrial invasion: 1 cm where myometrium is 2.3 cm in thickness Cervical stromal involvement: No Extent of involvement of other organs: No Lymph vascular invasion: Not identified Peritoneal washings: Negative ([id number]) Lymph nodes: number examined N/A; number positive N/A TNM code: pT1a, pNX 1 oFf 3IGO Stage (based on pathologic findings, needs clinical correlation): IA  Comments: Sections the endomyometrium away from the grossly  identified endometrial polyp show an invasive FIGO grade II endometrioid carcinoma. The tumor is confined within  inner half of the myometrium. No angiolymphatic invasion is identified. No cervical stromal involvement is identified. Sections of the grossly identified endometrial polyp show an endometrial polyp with associated atypical compacted hyperplasia with no definitive evidence of carcinoma.   12/07/2017 Imaging   US venous Doppler Right: No evidence of common femoral vein obstruction. Left: Findings consistent with acute deep vein thrombosis involving the left femoral vein, left proximal profunda vein, and left popliteal vein. Unable to adequately interrogate the common femoral and higher, or the calf secondary to significant edema and body habitus   12/07/2017 [place] [facility] Admission   She presented to the ER and was diagnosed with acute DVT   01/18/2018 - 01/21/2018 Hospital Admission   She was admitted to the hospital for management of severe persistent DVT   01/18/2018 Imaging   US venous Doppler Right: No evidence of common femoral vein obstruction. Left: Findings consistent with acute deep vein thrombosis involving the left common femoral vein, and left popliteal vein.   01/19/2018 Surgery   Pre-operative Diagnosis: Subacute DVT with severe post thrombotic syndrome Post-operative diagnosis:  Same Surgeon:  Erlene Quan C. Donzetta Matters, MD Procedure Performed: 1.  Ultrasound-guided cannulation left small saphenous vein 2.  Left lower extremity and central venography 3.  Intravascular ultrasound of left popliteal, femoral, common femoral, external and common iliac veins and IVC 4.  Stent of left common and external iliac veins with 14 x 60 mm Vici 5.  Moderate sedation with fentanyl and Versed for 50 minutes  Indications: 74 year old female with a history of DVT in October now presents with persistent left lower extremity swelling and ultrasound demonstrating likely persistent DVT.  She has been  on Xarelto at this time.  She is now indicated for venogram possible intervention.  Findings: Flow in the left lower extremity was stagnant throughout but by venogram all veins were patent.  There was a focal occlusive area approximately 2 cm in length at the common and external iliac vein junction at the hypogastric on the left.  After stenting and ballooning we had a diameter of 12 millimeters in the stent and venogram demonstrated flow in the lower extremity veins were previously was stagnant and no further residual stenosis in the left common and external iliac vein junction.   04/12/2018 Imaging   US Venous Doppler Right: No evidence of common femoral vein obstruction. Left: There is no evidence of deep vein thrombosis in the lower extremity. However, portions of this examination were limited- see technologist comments above. Left groin: Large hypoechoic area with mixed echoes noted measuring nearly 10 cm. Possible  hematoma versus unknown etiology. Ultrasound characteristics of enlarged lymph nodes noted in the groin.      05/15/2018 Imaging   US Venous Doppler Right: No evidence of deep vein thrombosis in the lower extremity. No indirect evidence of obstruction proximal to the inguinal ligament. Left: No reflux was noted in the common femoral vein , femoral vein in the thigh, popliteal vein, great saphenous vein at the saphenofemoral junction, great saphenous vein at the proximal thigh, great saphenous vein at the mid thigh, great saphenous vein  at the distal thigh, great saphenous vein at the knee, origin of the small saphenous vein, proximal small saphenous vein, and mid small saphenous vein. There is no evidence of deep vein thrombosis in the lower extremity. There is no evidence of superficial venous thrombosis. No cystic structure found in the popliteal fossa. Unable to evaluate extension of common femoral vein obstruction proximal to the inguinal  ligament.   06/01/2018 Imaging   1.  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 Additionally, there is lucency involving the medial sidewall of the left acetabulum with potential nondisplaced pathologic fracture. Further evaluation with contrast-enhanced pelvic MRI could be performed as clinically indicated. 2. The left pelvic arterial and venous system is encased by this infiltrative left pelvic sidewall mass however while difficult to ascertain, the left external iliac venous stent appears patent.   06/18/2018 Pathology Results   Lymph node for lymphoma, Left Inguinal - METASTATIC ADENOCARCINOMA, SEE COMMENT. Microscopic Comment Immunohistochemistry is positive for cytokeratin 7, PAX8, ER, and PR. Cytokeratin 5/6,and p63 are negative. The immunoprofile along with the patient's history are consistent with a gynecologic primary.   06/18/2018 Surgery   Pre-op Diagnosis: INGUINAL LYMPHADENOPATHY, PELVIC MASS     Procedure(s): EXCISIONAL BIOPSY DEEP LEFT INGUINAL LYMPH NODE  Surgeon(s): Coralie Keens, MD    06/24/2018 Cancer Staging   Staging form: Corpus Uteri - Carcinoma and Carcinosarcoma, AJCC 8th Edition - Clinical: Stage IVB (cT1a, cN2, pM1) - Signed by [name], MD on 06/24/2018    Genetic Testing   Patient has genetic testing done for MMR on pathology from 06/18/2018. Results revealed patient has the following mutation(s): MMR: abnormal   06/29/2018 Procedure   Placement of a subcutaneous port device. Catheter tip at the SVC and right atrium junction.    Genetic Testing   Patient has genetic testing done for MSI on pathology from 06/18/2018. Results revealed patient has the following mutation(s): MSI: High   07/02/2018 PET scan   Previous hysterectomy, with asymmetric focus of hypermetabolic activity in the left vaginal cuff, suspicious for residual or recurrent carcinoma.  Large hypermetabolic soft  tissue mass involving the left pelvic sidewall and acetabulum, consistent with metastatic disease.  No evidence metastatic disease within the abdomen, chest, or neck.   07/09/2018 Tumor Marker   Patient's tumor was tested for the following markers: CA-125 Results of the tumor marker test revealed 9   07/10/2018 - 08/24/2018 Chemotherapy   The patient had carboplatin and taxol x 3 cycles   07/17/2018 Genetic Testing   Negative genetic testing on the common hereditary cancer panel.  The Common Hereditary Gene Panel offered by [facility] includes sequencing and/or deletion duplication testing of the following 48 genes: APC, ATM, AXIN2, BARD1, BMPR1A, BRCA1, BRCA2, BRIP1, CDH1, CDK4, CDKN2A (p14ARF), CDKN2A (p16INK4a), CHEK2, CTNNA1, DICER1, EPCAM (Deletion/duplication testing only), GREM1 (promoter region deletion/duplication testing only), KIT, MEN1, MLH1, MSH2, MSH3, MSH6, MUTYH, NBN, NF1, NHTL1, PALB2, PDGFRA, PMS2, POLD1, POLE, PTEN, RAD50, RAD51C, RAD51D, RNF43, SDHB, SDHC, SDHD, SMAD4, SMARCA4. STK11, TP53, TSC1, TSC2, and VHL.  The following genes were evaluated for sequence changes only: SDHA and HOXB13 c.251G>A variant only. The report date is Jul 17, 2018.    10/03/2018 Imaging   CT abdomen and pelvis 1.  No acute intra-abdominal process. 2. Grossly unchanged left pelvic sidewall mass with osseous involvement of the medial acetabulum. Progressive mild displacement of the associated comminuted pathologic fracture involving the right acetabulum and puboacetabular junction.  3. New venous stents extending from the left common iliac vein origin to the proximal left common femoral vein. The stents are patent.   11/06/2018 -  Chemotherapy   The patient had pembrolizumab for chemotherapy treatment.     01/28/2019 Imaging   1. No substantial interval change in exam. 2. Interval development of mild fullness in the left intrarenal collecting system and  ureter without overt hydronephrosis at this  time. 3. Abnormal soft tissue along the left pelvic sidewall has decreased slightly in the interval. 4. Similar appearance of ill-defined fascial planes in the pelvis with some peritoneal thickening along the right pelvic sidewall and potentially involving the sigmoid mesocolon. 5. No substantial ascites.   05/03/2019 Imaging   1. Stable mild left pelvic sidewall soft tissue density. No new or progressive disease identified within the abdomen or pelvis.  2. Colonic diverticulosis. No radiographic evidence of diverticulitis.   Aortic Atherosclerosis (ICD10-I70.0).   09/09/2019 Imaging   1. No change in appearance of soft tissue thickening along the LEFT pelvic sidewall adjacent to chronic LEFT acetabular fracture. 2. Mild asymmetry of the bladder wall favoring the LEFT bladder wall, not well assessed. Similar accounting for variable degrees of distension on prior studies potentially related to prior radiation, attention on follow-up. 3. Signs of venous stenting in the LEFT hemipelvis with LEFT lower extremity muscular atrophy and mild stranding with similar appearance. Signs of colonic diverticulosis and diverticular disease without change.   Metastasis to lymph nodes ([place])  06/23/2018 Initial Diagnosis   Metastasis to lymph nodes ([place])   07/10/2018 - 08/24/2018 Chemotherapy   The patient had palonosetron (ALOXI) injection 0.25 mg, 0.25 mg, Intravenous,  Once, 3 of 6 cycles Administration: 0.25 mg (07/10/2018), 0.25 mg (07/31/2018), 0.25 mg (08/24/2018) CARBOplatin (PARAPLATIN) 480 mg in sodium chloride 0.9 % 250 mL chemo infusion, 480 mg (100 % of original dose 482.5 mg), Intravenous,  Once, 3 of 6 cycles Dose modification: 482.5 mg (original dose 482.5 mg, Cycle 1) Administration: 480 mg (07/10/2018), 480 mg (07/31/2018), 480 mg (08/24/2018) PACLitaxel (TAXOL) 276 mg in sodium chloride 0.9 % 250 mL chemo infusion (> 5m/m2), 140 mg/m2 = 276 mg (80 % of original dose 175 mg/m2), Intravenous,  Once, 3 of  6 cycles Dose modification: 140 mg/m2 (80 % of original dose 175 mg/m2, Cycle 1, Reason: Dose Not Tolerated) Administration: 276 mg (07/10/2018), 276 mg (07/31/2018), 276 mg (08/24/2018) fosaprepitant (EMEND) 150 mg, dexamethasone (DECADRON) 12 mg in sodium chloride 0.9 % 145 mL IVPB, , Intravenous,  Once, 3 of 6 cycles Administration:  (07/10/2018),  (07/31/2018),  (08/24/2018)  for chemotherapy treatment.    11/06/2018 -  Chemotherapy   The patient had pembrolizumab for chemotherapy treatment.     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  06/24/2018 Initial Diagnosis   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   07/10/2018 - 08/24/2018 Chemotherapy   The patient had palonosetron (ALOXI) injection 0.25 mg, 0.25 mg, Intravenous,  Once, 3 of 6 cycles Administration: 0.25 mg (07/10/2018), 0.25 mg (07/31/2018), 0.25 mg (08/24/2018) CARBOplatin (PARAPLATIN) 480 mg in sodium chloride 0.9 % 250 mL chemo infusion, 480 mg (100 % of original dose 482.5 mg), Intravenous,  Once, 3 of 6 cycles Dose modification: 482.5 mg (original dose 482.5 mg, Cycle 1) Administration: 480 mg (07/10/2018), 480 mg (07/31/2018), 480 mg (08/24/2018) PACLitaxel (TAXOL) 276 mg in sodium chloride 0.9 % 250 mL chemo infusion (> 82mm2), 140 mg/m2 = 276 mg (80 % of original dose 175 mg/m2), Intravenous,  Once, 3 of 6 cycles Dose modification: 140 mg/m2 (80 % of original dose 175 mg/m2, Cycle 1, Reason: Dose Not Tolerated) Administration: 276 mg (07/10/2018), 276 mg (07/31/2018), 276 mg (08/24/2018) fosaprepitant (EMEND) 150 mg, dexamethasone (DECADRON) 12 mg in sodium chloride 0.9 % 145 mL IVPB, , Intravenous,  Once, 3 of 6 cycles Administration:  (07/10/2018),  (07/31/2018),  (08/24/2018)  for chemotherapy treatment.    11/06/2018 -  Chemotherapy   The patient had pembrolizumab for chemotherapy treatment.     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       REVIEW OF SYSTEMS:   Constitutional: Denies fevers, chills or abnormal weight loss Eyes: Denies blurriness of vision Ears, nose, mouth, throat, and face: Denies mucositis or sore throat Respiratory: Denies cough, dyspnea or wheezes Cardiovascular: Denies palpitation, chest discomfort or lower extremity swelling Gastrointestinal:  Denies nausea, heartburn or change in bowel habits Skin: Denies abnormal skin rashes Lymphatics: Denies new lymphadenopathy or easy bruising Neurological:Denies numbness, tingling or new weaknesses Behavioral/Psych: Mood is stable, no new changes  All other systems were reviewed with the patient and are negative.  I have reviewed the past medical history, past surgical history, social history and family history with the patient and they are unchanged from previous note.  ALLERGIES:  has No Known Allergies.  MEDICATIONS:  Current Outpatient Medications  Medication Sig Dispense Refill  . clopidogrel (PLAVIX) 75 MG tablet Take 1 tablet (75 mg total) by mouth daily with breakfast. 30 tablet 1  . diclofenac sodium (VOLTAREN) 1 % GEL APPLY 4GRAMS 4 TIMES A DAY AS NEEDED FOR PAINS    . Eliquis DVT/PE Starter Pack (ELIQUIS STARTER PACK) 5 MG TABS Take as directed on package: start with two-70m tablets twice daily for 7 days. On day 8, switch to one-559mtablet twice daily. 1 each 0  . gabapentin (NEURONTIN) 300 MG capsule Take 300 mg by mouth 2 (two) times daily.    . methadone (DOLOPHINE) 10 MG tablet Take 1 tablet (10 mg total) by mouth every 12 (twelve) hours. 60 tablet 0  . morphine (MSIR) 15 MG tablet Take 1 tablet (15 mg total) by mouth every 6 (six) hours as needed for severe pain. 60 tablet 0  . Olopatadine HCl 0.2 % SOLN Place 1 drop into both eyes daily.     No current facility-administered medications for this visit.     PHYSICAL EXAMINATION: ECOG PERFORMANCE STATUS: 1 - Symptomatic but completely ambulatory  Vitals:   12/03/19 1012  BP: 122/66  Pulse: 93  Resp: 18  Temp: 98 F (36.7 C)  SpO2: 100%   Filed Weights   12/03/19 1012  Weight: 192 lb 3.2 oz (87.2 kg)    GENERAL:alert, no distress and comfortable SKIN: skin color, texture, turgor are normal, no rashes or significant lesions EYES: normal, Conjunctiva are pink and non-injected, sclera clear OROPHARYNX:no exudate, no erythema and lips, buccal mucosa, and tongue normal  NECK: supple, thyroid normal size, non-tender, without nodularity LYMPH:  no palpable lymphadenopathy in the cervical, axillary or inguinal LUNGS: clear to auscultation and percussion with normal breathing effort HEART: regular rate & rhythm and no murmurs with moderate lower extremity edema ABDOMEN:abdomen soft, non-tender and normal bowel sounds Musculoskeletal:no cyanosis of digits and no clubbing  NEURO: alert & oriented x 3 with fluent speech, no focal motor/sensory deficits  LABORATORY DATA:  I have reviewed the data as listed    Component Value Date/Time   NA 136 11/12/2019 1222   K 3.9 11/12/2019 1222   CL 105 11/12/2019 1222   CO2 27 11/12/2019 1222   GLUCOSE 79 11/12/2019 1222   BUN 22 11/12/2019 1222   CREATININE 0.73 11/12/2019 1222   CALCIUM 9.4 11/12/2019 1222   PROT  7.2 11/12/2019 1222   ALBUMIN 3.7 11/12/2019 1222   AST 13 (L) 11/12/2019 1222   ALT 9 11/12/2019 1222   ALKPHOS 121 11/12/2019 1222   BILITOT 0.7 11/12/2019 1222   GFRNONAA >60 11/12/2019 1222   GFRAA >60 11/12/2019 1222    No results found for: SPEP, UPEP  Lab Results  Component Value Date   WBC 3.5 (L) 12/03/2019   NEUTROABS 2.2 12/03/2019   HGB 11.8 (L) 12/03/2019   HCT 35.6 (L) 12/03/2019   MCV 91.5 12/03/2019   PLT 196 12/03/2019      Chemistry      Component Value Date/Time   NA 136 11/12/2019 1222   K 3.9 11/12/2019 1222   CL 105 11/12/2019 1222   CO2  27 11/12/2019 1222   BUN 22 11/12/2019 1222   CREATININE 0.73 11/12/2019 1222      Component Value Date/Time   CALCIUM 9.4 11/12/2019 1222   ALKPHOS 121 11/12/2019 1222   AST 13 (L) 11/12/2019 1222   ALT 9 11/12/2019 1222   BILITOT 0.7 11/12/2019 1222

## 2019-12-03 NOTE — Assessment & Plan Note (Signed)
She has stable CT imaging It is likely that the soft tissue thickening along the pelvic sidewall will remain the same indefinitely, fibrotic changes cannot be excluded She tolerated treatment very well Due to the recurrent disease status, I recommend she stays on pembrolizumab indefinitely and she is in agreement with the plan of care The stability of her CT imaging, I plan to space out the interval imaging studies, the next one would be January 2022.  I will continue to see her every other treatment for toxicity review

## 2019-12-24 ENCOUNTER — Inpatient Hospital Stay: Payer: Medicare Other

## 2019-12-24 ENCOUNTER — Other Ambulatory Visit: Payer: Self-pay

## 2019-12-24 ENCOUNTER — Encounter: Payer: Self-pay | Admitting: Hematology and Oncology

## 2019-12-24 ENCOUNTER — Inpatient Hospital Stay (HOSPITAL_BASED_OUTPATIENT_CLINIC_OR_DEPARTMENT_OTHER): Payer: Medicare Other | Admitting: Hematology and Oncology

## 2019-12-24 ENCOUNTER — Inpatient Hospital Stay: Payer: Medicare Other | Attending: Hematology and Oncology

## 2019-12-24 VITALS — BP 129/63 | HR 66 | Temp 97.4°F | Resp 17 | Ht 61.0 in | Wt 205.2 lb

## 2019-12-24 DIAGNOSIS — C541 Malignant neoplasm of endometrium: Secondary | ICD-10-CM | POA: Insufficient documentation

## 2019-12-24 DIAGNOSIS — G893 Neoplasm related pain (acute) (chronic): Secondary | ICD-10-CM | POA: Insufficient documentation

## 2019-12-24 DIAGNOSIS — I700 Atherosclerosis of aorta: Secondary | ICD-10-CM | POA: Insufficient documentation

## 2019-12-24 DIAGNOSIS — C801 Malignant (primary) neoplasm, unspecified: Secondary | ICD-10-CM

## 2019-12-24 DIAGNOSIS — Z7189 Other specified counseling: Secondary | ICD-10-CM

## 2019-12-24 DIAGNOSIS — C7951 Secondary malignant neoplasm of bone: Secondary | ICD-10-CM

## 2019-12-24 DIAGNOSIS — C55 Malignant neoplasm of uterus, part unspecified: Secondary | ICD-10-CM

## 2019-12-24 DIAGNOSIS — D61818 Other pancytopenia: Secondary | ICD-10-CM

## 2019-12-24 DIAGNOSIS — Z79899 Other long term (current) drug therapy: Secondary | ICD-10-CM | POA: Diagnosis not present

## 2019-12-24 DIAGNOSIS — I825Z2 Chronic embolism and thrombosis of unspecified deep veins of left distal lower extremity: Secondary | ICD-10-CM | POA: Diagnosis not present

## 2019-12-24 DIAGNOSIS — R319 Hematuria, unspecified: Secondary | ICD-10-CM | POA: Insufficient documentation

## 2019-12-24 DIAGNOSIS — E039 Hypothyroidism, unspecified: Secondary | ICD-10-CM | POA: Diagnosis not present

## 2019-12-24 DIAGNOSIS — C774 Secondary and unspecified malignant neoplasm of inguinal and lower limb lymph nodes: Secondary | ICD-10-CM

## 2019-12-24 DIAGNOSIS — K5730 Diverticulosis of large intestine without perforation or abscess without bleeding: Secondary | ICD-10-CM | POA: Diagnosis not present

## 2019-12-24 DIAGNOSIS — Z5112 Encounter for antineoplastic immunotherapy: Secondary | ICD-10-CM | POA: Insufficient documentation

## 2019-12-24 DIAGNOSIS — Z86718 Personal history of other venous thrombosis and embolism: Secondary | ICD-10-CM | POA: Diagnosis not present

## 2019-12-24 LAB — CBC WITH DIFFERENTIAL (CANCER CENTER ONLY)
Abs Immature Granulocytes: 0.01 10*3/uL (ref 0.00–0.07)
Basophils Absolute: 0 10*3/uL (ref 0.0–0.1)
Basophils Relative: 1 %
Eosinophils Absolute: 0.2 10*3/uL (ref 0.0–0.5)
Eosinophils Relative: 6 %
HCT: 36.4 % (ref 36.0–46.0)
Hemoglobin: 11.8 g/dL — ABNORMAL LOW (ref 12.0–15.0)
Immature Granulocytes: 0 %
Lymphocytes Relative: 19 %
Lymphs Abs: 0.8 10*3/uL (ref 0.7–4.0)
MCH: 29.2 pg (ref 26.0–34.0)
MCHC: 32.4 g/dL (ref 30.0–36.0)
MCV: 90.1 fL (ref 80.0–100.0)
Monocytes Absolute: 0.4 10*3/uL (ref 0.1–1.0)
Monocytes Relative: 10 %
Neutro Abs: 2.9 10*3/uL (ref 1.7–7.7)
Neutrophils Relative %: 64 %
Platelet Count: 187 10*3/uL (ref 150–400)
RBC: 4.04 MIL/uL (ref 3.87–5.11)
RDW: 13.2 % (ref 11.5–15.5)
WBC Count: 4.4 10*3/uL (ref 4.0–10.5)
nRBC: 0 % (ref 0.0–0.2)

## 2019-12-24 LAB — CMP (CANCER CENTER ONLY)
ALT: 7 U/L (ref 0–44)
AST: 11 U/L — ABNORMAL LOW (ref 15–41)
Albumin: 3.7 g/dL (ref 3.5–5.0)
Alkaline Phosphatase: 142 U/L — ABNORMAL HIGH (ref 38–126)
Anion gap: 9 (ref 5–15)
BUN: 16 mg/dL (ref 8–23)
CO2: 24 mmol/L (ref 22–32)
Calcium: 9.2 mg/dL (ref 8.9–10.3)
Chloride: 107 mmol/L (ref 98–111)
Creatinine: 0.66 mg/dL (ref 0.44–1.00)
GFR, Estimated: >60 mL/min (ref >60)
Glucose, Bld: 101 mg/dL — ABNORMAL HIGH (ref 70–99)
Potassium: 3.8 mmol/L (ref 3.5–5.1)
Sodium: 140 mmol/L (ref 135–145)
Total Bilirubin: 0.6 mg/dL (ref 0.3–1.2)
Total Protein: 7.3 g/dL (ref 6.5–8.1)

## 2019-12-24 LAB — TSH: TSH: 1.799 u[IU]/mL (ref 0.308–3.960)

## 2019-12-24 MED ORDER — SODIUM CHLORIDE 0.9 % IV SOLN
Freq: Once | INTRAVENOUS | Status: AC
  Filled 2019-12-24: qty 250

## 2019-12-24 MED ORDER — SODIUM CHLORIDE 0.9 % IV SOLN
200.0000 mg | Freq: Once | INTRAVENOUS | Status: AC
  Administered 2019-12-24: 200 mg via INTRAVENOUS
  Filled 2019-12-24: qty 8

## 2019-12-24 MED ORDER — SODIUM CHLORIDE 0.9% FLUSH
10.0000 mL | Freq: Once | INTRAVENOUS | Status: AC
  Administered 2019-12-24: 10 mL
  Filled 2019-12-24: qty 10

## 2019-12-24 NOTE — Progress Notes (Signed)
[place] OFFICE PROGRESS NOTE  Patient Care Team: Nolene Ebbs, MD as PCP - General (Internal Medicine)  ASSESSMENT & PLAN:  Uterine cancer [facility]) Her last CT imaging showed soft tissue thickening along the pelvic sidewall, that will remain the same indefinitely, fibrotic changes cannot be excluded She tolerated treatment very well Due to the recurrent disease status, I recommend she stays on pembrolizumab indefinitely and she is in agreement with the plan of care The stability of her CT imaging, I plan to space out the interval imaging studies, the next one would be January 2022.  I will continue to see her every other treatment for toxicity review  Cancer associated pain She has reasonable pain control She will continue current prescribed morphine sulfate and methadone as needed We discussed narcotic refill policy  Lower leg DVT (deep venous thromboembolism), chronic, left ([place]) She will continue anticoagulation therapy indefinitely  Pancytopenia, acquired ([place]) She was found to have iron deficiency anemia and borderline vitamin B12 deficiency After receiving intravenous iron infusion, her blood counts are stable We will continue to monitor closely She is not symptomatic We will proceed without delay   Orders Placed This Encounter  Procedures  . CBC with Differential/Platelet    Standing Status:   Standing    Number of Occurrences:   22    Standing Expiration Date:   12/23/2020  . Comprehensive metabolic panel    Standing Status:   Standing    Number of Occurrences:   33    Standing Expiration Date:   12/23/2020  . TSH    Standing Status:   Standing    Number of Occurrences:   22    Standing Expiration Date:   12/23/2020    All questions were answered. The patient knows to call the clinic with any problems, questions or concerns. The total time spent in the appointment was 20 minutes encounter with patients including review of chart and various tests  results, discussions about plan of care and coordination of care plan   [name], MD 12/24/2019 10:39 AM  INTERVAL HISTORY: Please see below for problem oriented charting. 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 Uterus +/- tubes/ovaries, neoplastic, with left fallopian tube and ovary - INVASIVE ENDOMETRIOID CARCINOMA (1.5 CM), FIGO GRADE II, ARISING IN A BACKGROUND OF ATYPICAL COMPLEX HYPERPLASIA, CONFINED WITHIN INNER HALF OF THE MYOMETRIUM. - ENDOMETRIAL POLYP WITH ASSOCIATED ATYPICAL COMPLEX HYPERPLASIA. - MYOMETRIUM: LEIOMYOMATA. - CERVIX: BENIGN SQUAMOUS MUCOSA AND ENDOCERVICAL MUCOSA, NO DYSPLASIA OR MALIGNANCY. - LEFT OVARY: BENIGN OVARIAN TISSUE WITH ENDOSALPINGOSIS, NO EVIDENCE OF ATYPIA OR MALIGNANCY. - LEFT FALLOPIAN TUBE: NO HISTOLOGIC ABNORMALITIES. - PLEASE SEE ONCOLOGY TEMPLATE FOR DETAIL. 2. Ovary and fallopian tube, right - BENIGN OVARIAN TISSUE WITH ENDOSALPINGOSIS, NO ATYPIA OR MALIGNANCY. - BENIGN FALLOPIAN TUBAL TISSUE, NO PATHOLOGIC ABNORMALITIES. Microscopic Comment 1. UTERUS Specimen: Uterus, cervix, bilateral ovaries and fallopian tubes Procedure: Total hysterectomy and bilateral salpingo-oophorectomy Lymph node sampling performed: No Specimen integrity: Intact Maximum tumor size (cm): 1.5 cm, glass slide measurement Histologic type: Invasive endometrioid carcinoma Grade: FIGO grade II Myometrial invasion:  1 cm where myometrium is 2.3 cm in thickness Cervical stromal involvement: No Extent of involvement of  other organs: No Lymph vascular invasion: Not identified Peritoneal washings: Negative ([id number]) Lymph nodes: number examined N/A; number positive N/A TNM code: pT1a, pNX 1 oFf 3IGO Stage (based on pathologic findings, needs clinical correlation): IA  Comments: Sections the endomyometrium away from the grossly identified endometrial polyp show an invasive FIGO grade II endometrioid carcinoma. The tumor is confined within inner half of the myometrium. No angiolymphatic invasion is identified. No cervical stromal involvement is identified. Sections of the grossly identified endometrial polyp show an endometrial polyp with associated atypical compacted hyperplasia with no definitive evidence of carcinoma.   12/07/2017 Imaging   US venous Doppler Right: No evidence of common femoral vein obstruction. Left: Findings consistent with acute deep vein thrombosis involving the left femoral vein, left proximal profunda vein, and left popliteal vein. Unable to adequately interrogate the common femoral and higher, or the calf secondary to significant edema and body habitus   12/07/2017 [facility] Admission   She presented to the ER and was diagnosed with acute DVT   01/18/2018 - 01/21/2018 Hospital Admission   She was admitted to the hospital for management of severe persistent DVT   01/18/2018 Imaging   US venous Doppler Right: No evidence of common femoral vein obstruction. Left: Findings consistent with acute deep vein thrombosis involving the left common femoral vein, and left popliteal vein.   01/19/2018 Surgery   Pre-operative Diagnosis: Subacute DVT with severe post thrombotic syndrome Post-operative diagnosis:  Same Surgeon:  Erlene Quan C. Donzetta Matters, MD Procedure Performed: 1.  Ultrasound-guided cannulation left small saphenous vein 2.  Left lower extremity and central venography 3.  Intravascular ultrasound of left popliteal, femoral, common femoral, external and common iliac veins and IVC 4.   Stent of left common and external iliac veins with 14 x 60 mm Vici 5.  Moderate sedation with fentanyl and Versed for 50 minutes  Indications: 74 year old female with a history of DVT in October now presents with persistent left lower extremity swelling and ultrasound demonstrating likely persistent DVT.  She has been on Xarelto at this time.  She is now indicated for venogram possible intervention.  Findings: Flow in the left lower extremity was stagnant throughout but by venogram all veins were patent.  There was a focal occlusive area approximately 2 cm in length at the common and external iliac vein junction at the hypogastric on the left.  After stenting and ballooning we had a diameter of 12 millimeters in the stent and venogram demonstrated flow in the lower extremity veins were previously was stagnant and no further residual stenosis in the left common and external iliac vein junction.   04/12/2018 Imaging   US Venous Doppler Right: No evidence of common femoral vein obstruction. Left: There is no evidence of deep vein thrombosis in the lower extremity. However, portions of this examination were limited- see technologist comments above. Left groin: Large hypoechoic area with mixed echoes noted measuring nearly 10 cm. Possible  hematoma versus unknown etiology. Ultrasound characteristics of enlarged lymph nodes noted in the groin.      05/15/2018 Imaging   US Venous Doppler Right: No evidence of deep vein thrombosis in the lower extremity. No indirect evidence of obstruction proximal to the inguinal ligament. Left: No reflux was noted in the common femoral vein , femoral vein in the thigh, popliteal vein, great saphenous vein at the saphenofemoral junction, great saphenous vein at the  proximal thigh, great saphenous vein at the mid thigh, great saphenous vein  at the distal thigh, great saphenous vein at the knee, origin of the small saphenous vein, proximal small saphenous vein, and mid  small saphenous vein. There is no evidence of deep vein thrombosis in the lower extremity. There is no evidence of superficial venous thrombosis. No cystic structure found in the popliteal fossa. Unable to evaluate extension of common femoral vein obstruction proximal to the inguinal ligament.   06/01/2018 Imaging   1. 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 Additionally, there is lucency involving the medial sidewall of the left acetabulum with potential nondisplaced pathologic fracture. Further evaluation with contrast-enhanced pelvic MRI could be performed as clinically indicated. 2. The left pelvic arterial and venous system is encased by this infiltrative left pelvic sidewall mass however while difficult to ascertain, the left external iliac venous stent appears patent.   06/18/2018 Pathology Results   Lymph node for lymphoma, Left Inguinal - METASTATIC ADENOCARCINOMA, SEE COMMENT. Microscopic Comment Immunohistochemistry is positive for cytokeratin 7, PAX8, ER, and PR. Cytokeratin 5/6,and p63 are negative. The immunoprofile along with the patient's history are consistent with a gynecologic primary.   06/18/2018 Surgery   Pre-op Diagnosis: INGUINAL LYMPHADENOPATHY, PELVIC MASS     Procedure(s): EXCISIONAL BIOPSY DEEP LEFT INGUINAL LYMPH NODE  Surgeon(s): Coralie Keens, MD    06/24/2018 Cancer Staging   Staging form: Corpus Uteri - Carcinoma and Carcinosarcoma, AJCC 8th Edition - Clinical: Stage IVB (cT1a, cN2, pM1) - Signed by [name], MD on 06/24/2018    Genetic Testing   Patient has genetic testing done for MMR on pathology from 06/18/2018. Results revealed patient has the following mutation(s): MMR: abnormal   06/29/2018 Procedure   Placement of a subcutaneous port device. Catheter tip at the SVC and right atrium junction.    Genetic Testing   Patient has genetic  testing done for MSI on pathology from 06/18/2018. Results revealed patient has the following mutation(s): MSI: High   07/02/2018 PET scan   Previous hysterectomy, with asymmetric focus of hypermetabolic activity in the left vaginal cuff, suspicious for residual or recurrent carcinoma.  Large hypermetabolic soft tissue mass involving the left pelvic sidewall and acetabulum, consistent with metastatic disease.  No evidence metastatic disease within the abdomen, chest, or neck.   07/09/2018 Tumor Marker   Patient's tumor was tested for the following markers: CA-125 Results of the tumor marker test revealed 9   07/10/2018 - 08/24/2018 Chemotherapy   The patient had carboplatin and taxol x 3 cycles   07/17/2018 Genetic Testing   Negative genetic testing on the common hereditary cancer panel.  The Common Hereditary Gene Panel offered by [facility] includes sequencing and/or deletion duplication testing of the following 48 genes: APC, ATM, AXIN2, BARD1, BMPR1A, BRCA1, BRCA2, BRIP1, CDH1, CDK4, CDKN2A (p14ARF), CDKN2A (p16INK4a), CHEK2, CTNNA1, DICER1, EPCAM (Deletion/duplication testing only), GREM1 (promoter region deletion/duplication testing only), KIT, MEN1, MLH1, MSH2, MSH3, MSH6, MUTYH, NBN, NF1, NHTL1, PALB2, PDGFRA, PMS2, POLD1, POLE, PTEN, RAD50, RAD51C, RAD51D, RNF43, SDHB, SDHC, SDHD, SMAD4, SMARCA4. STK11, TP53, TSC1, TSC2, and VHL.  The following genes were evaluated for sequence changes only: SDHA and HOXB13 c.251G>A variant only. The report date is Jul 17, 2018.    10/03/2018 Imaging   CT abdomen and pelvis 1.  No acute intra-abdominal process. 2. Grossly unchanged left pelvic sidewall mass with osseous involvement of the medial acetabulum. Progressive mild displacement of  the associated comminuted pathologic fracture involving the right acetabulum and puboacetabular junction.  3. New venous stents extending from the left common iliac vein origin to the proximal left common femoral vein.  The stents are patent.   11/06/2018 -  Chemotherapy   The patient had pembrolizumab for chemotherapy treatment.     01/28/2019 Imaging   1. No substantial interval change in exam. 2. Interval development of mild fullness in the left intrarenal collecting system and ureter without overt hydronephrosis at this time. 3. Abnormal soft tissue along the left pelvic sidewall has decreased slightly in the interval. 4. Similar appearance of ill-defined fascial planes in the pelvis with some peritoneal thickening along the right pelvic sidewall and potentially involving the sigmoid mesocolon. 5. No substantial ascites.   05/03/2019 Imaging   1. Stable mild left pelvic sidewall soft tissue density. No new or progressive disease identified within the abdomen or pelvis.  2. Colonic diverticulosis. No radiographic evidence of diverticulitis.   Aortic Atherosclerosis (ICD10-I70.0).   09/09/2019 Imaging   1. No change in appearance of soft tissue thickening along the LEFT pelvic sidewall adjacent to chronic LEFT acetabular fracture. 2. Mild asymmetry of the bladder wall favoring the LEFT bladder wall, not well assessed. Similar accounting for variable degrees of distension on prior studies potentially related to prior radiation, attention on follow-up. 3. Signs of venous stenting in the LEFT hemipelvis with LEFT lower extremity muscular atrophy and mild stranding with similar appearance. Signs of colonic diverticulosis and diverticular disease without change.   Metastasis to lymph nodes ([place])  06/23/2018 Initial Diagnosis   Metastasis to lymph nodes ([place])   07/10/2018 - 08/24/2018 Chemotherapy   The patient had palonosetron (ALOXI) injection 0.25 mg, 0.25 mg, Intravenous,  Once, 3 of 6 cycles Administration: 0.25 mg (07/10/2018), 0.25 mg (07/31/2018), 0.25 mg (08/24/2018) CARBOplatin (PARAPLATIN) 480 mg in sodium chloride 0.9 % 250 mL chemo infusion, 480 mg (100 % of original dose 482.5 mg), Intravenous,  Once, 3  of 6 cycles Dose modification: 482.5 mg (original dose 482.5 mg, Cycle 1) Administration: 480 mg (07/10/2018), 480 mg (07/31/2018), 480 mg (08/24/2018) PACLitaxel (TAXOL) 276 mg in sodium chloride 0.9 % 250 mL chemo infusion (> 69m/m2), 140 mg/m2 = 276 mg (80 % of original dose 175 mg/m2), Intravenous,  Once, 3 of 6 cycles Dose modification: 140 mg/m2 (80 % of original dose 175 mg/m2, Cycle 1, Reason: Dose Not Tolerated) Administration: 276 mg (07/10/2018), 276 mg (07/31/2018), 276 mg (08/24/2018) fosaprepitant (EMEND) 150 mg, dexamethasone (DECADRON) 12 mg in sodium chloride 0.9 % 145 mL IVPB, , Intravenous,  Once, 3 of 6 cycles Administration:  (07/10/2018),  (07/31/2018),  (08/24/2018)  for chemotherapy treatment.    11/06/2018 -  Chemotherapy   The patient had pembrolizumab for chemotherapy treatment.     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 [place]  06/24/2018 Initial Diagnosis   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 ([place]   07/10/2018 - 08/24/2018 Chemotherapy   The patient had palonosetron (ALOXI) injection 0.25 mg, 0.25 mg, Intravenous,  Once, 3 of 6 cycles Administration: 0.25 mg (07/10/2018), 0.25 mg (07/31/2018), 0.25 mg (08/24/2018) CARBOplatin (PARAPLATIN) 480 mg in sodium chloride 0.9 % 250 mL chemo infusion, 480 mg (100 % of original dose 482.5 mg), Intravenous,  Once, 3 of 6 cycles Dose modification: 482.5 mg (original dose 482.5 mg, Cycle 1) Administration: 480 mg (07/10/2018), 480 mg (07/31/2018), 480 mg (08/24/2018) PACLitaxel (TAXOL) 276 mg in sodium chloride 0.9 % 250 mL chemo infusion (> 856mm2), 140 mg/m2 = 276 mg (80 % of  original dose 175 mg/m2), Intravenous,  Once, 3 of 6 cycles Dose modification: 140 mg/m2 (80 % of original dose 175 mg/m2, Cycle 1, Reason: Dose Not Tolerated) Administration: 276 mg (07/10/2018), 276 mg (07/31/2018), 276 mg (08/24/2018) fosaprepitant (EMEND) 150 mg, dexamethasone (DECADRON) 12 mg in sodium chloride 0.9 % 145 mL IVPB, , Intravenous,  Once, 3 of 6 cycles Administration:  (07/10/2018),  (07/31/2018),   (08/24/2018)  for chemotherapy treatment.    11/06/2018 -  Chemotherapy   The patient had pembrolizumab for chemotherapy treatment.     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       REVIEW OF SYSTEMS:   Constitutional: Denies fevers, chills or abnormal weight loss Eyes: Denies blurriness of vision Ears, nose, mouth, throat, and face: Denies mucositis or sore throat Respiratory: Denies cough, dyspnea or wheezes Cardiovascular: Denies palpitation, chest discomfort or lower extremity swelling Gastrointestinal:  Denies nausea, heartburn or change in bowel habits Skin: Denies abnormal skin rashes Lymphatics: Denies new lymphadenopathy or easy bruising Neurological:Denies numbness, tingling or new weaknesses Behavioral/Psych: Mood is stable, no new changes  All other systems were reviewed with the patient and are negative.  I have reviewed the past medical history, past surgical history, social history and family history with the patient and they are unchanged from previous note.  ALLERGIES:  has No Known Allergies.  MEDICATIONS:  Current Outpatient Medications  Medication Sig Dispense Refill  . clopidogrel (PLAVIX) 75 MG tablet Take 1 tablet (75 mg total) by mouth daily with breakfast. 30 tablet 1  . diclofenac sodium (VOLTAREN) 1 % GEL APPLY 4GRAMS 4 TIMES A DAY AS NEEDED FOR PAINS    . Eliquis DVT/PE Starter Pack (ELIQUIS STARTER PACK) 5 MG TABS Take as directed on package: start with two-62m tablets twice daily for 7 days. On day 8, switch to one-561mtablet twice daily. 1 each 0  . gabapentin (NEURONTIN) 300 MG capsule Take 300 mg by mouth 2 (two) times daily.    . methadone (DOLOPHINE) 10 MG tablet Take 1 tablet (10 mg total) by  mouth every 12 (twelve) hours. 60 tablet 0  . morphine (MSIR) 15 MG tablet Take 1 tablet (15 mg total) by mouth every 6 (six) hours as needed for severe pain. 60 tablet 0  . Olopatadine HCl 0.2 % SOLN Place 1 drop into both eyes daily.     No current facility-administered medications for this visit.    PHYSICAL EXAMINATION: ECOG PERFORMANCE STATUS: 1 - Symptomatic but completely ambulatory  Vitals:   12/24/19 1031  BP: 129/63  Pulse: 66  Resp: 17  Temp: (!) 97.4 F (36.3 C)  SpO2: 100%   Filed Weights   12/24/19 1031  Weight: 205 lb 3.2 oz (93.1 kg)    GENERAL:alert, no distress and comfortable SKIN: skin color, texture, turgor are normal, no rashes or significant lesions EYES: normal, Conjunctiva are pink and non-injected, sclera clear OROPHARYNX:no exudate, no erythema and lips, buccal mucosa, and tongue normal  NECK: supple, thyroid normal size, non-tender, without nodularity LYMPH:  no palpable lymphadenopathy in the cervical, axillary or inguinal LUNGS: clear to auscultation and percussion with normal breathing effort HEART: regular rate & rhythm and no murmurs and no lower extremity edema ABDOMEN:abdomen soft, non-tender and normal bowel sounds Musculoskeletal:no cyanosis of digits and no clubbing  NEURO:  alert & oriented x 3 with fluent speech, no focal motor/sensory deficits  LABORATORY DATA:  I have reviewed the data as listed    Component Value Date/Time   NA 138 12/03/2019 0957   K 3.7 12/03/2019 0957   CL 105 12/03/2019 0957   CO2 28 12/03/2019 0957   GLUCOSE 95 12/03/2019 0957   BUN 15 12/03/2019 0957   CREATININE 0.69 12/03/2019 0957   CALCIUM 9.6 12/03/2019 0957   PROT 7.6 12/03/2019 0957   ALBUMIN 3.7 12/03/2019 0957   AST 11 (L) 12/03/2019 0957   ALT 7 12/03/2019 0957   ALKPHOS 124 12/03/2019 0957   BILITOT 0.7 12/03/2019 0957   GFRNONAA >60 12/03/2019 0957   GFRAA >60 11/12/2019 1222    No results found for: SPEP, UPEP  Lab Results   Component Value Date   WBC 4.4 12/24/2019   NEUTROABS 2.9 12/24/2019   HGB 11.8 (L) 12/24/2019   HCT 36.4 12/24/2019   MCV 90.1 12/24/2019   PLT 187 12/24/2019      Chemistry      Component Value Date/Time   NA 138 12/03/2019 0957   K 3.7 12/03/2019 0957   CL 105 12/03/2019 0957   CO2 28 12/03/2019 0957   BUN 15 12/03/2019 0957   CREATININE 0.69 12/03/2019 0957      Component Value Date/Time   CALCIUM 9.6 12/03/2019 0957   ALKPHOS 124 12/03/2019 0957   AST 11 (L) 12/03/2019 0957   ALT 7 12/03/2019 0957   BILITOT 0.7 12/03/2019 0957

## 2019-12-24 NOTE — Assessment & Plan Note (Signed)
She will continue anticoagulation therapy indefinitely

## 2019-12-24 NOTE — Assessment & Plan Note (Signed)
She has reasonable pain control She will continue current prescribed morphine sulfate and methadone as needed We discussed narcotic refill policy 

## 2019-12-24 NOTE — Assessment & Plan Note (Signed)
Her last CT imaging showed soft tissue thickening along the pelvic sidewall, that will remain the same indefinitely, fibrotic changes cannot be excluded She tolerated treatment very well Due to the recurrent disease status, I recommend she stays on pembrolizumab indefinitely and she is in agreement with the plan of care The stability of her CT imaging, I plan to space out the interval imaging studies, the next one would be January 2022.  I will continue to see her every other treatment for toxicity review

## 2019-12-24 NOTE — Patient Instructions (Signed)
[facility] Discharge Instructions for Patients Receiving Chemotherapy  Today you received the following chemotherapy agents: Beryle Flock  To help prevent nausea and vomiting after your treatment, we encourage you to take your nausea medication as directed.    If you develop nausea and vomiting that is not controlled by your nausea medication, call the clinic.   BELOW ARE SYMPTOMS THAT SHOULD BE REPORTED IMMEDIATELY:  *FEVER GREATER THAN 100.5 F  *CHILLS WITH OR WITHOUT FEVER  NAUSEA AND VOMITING THAT IS NOT CONTROLLED WITH YOUR NAUSEA MEDICATION  *UNUSUAL SHORTNESS OF BREATH  *UNUSUAL BRUISING OR BLEEDING  TENDERNESS IN MOUTH AND THROAT WITH OR WITHOUT PRESENCE OF ULCERS  *URINARY PROBLEMS  *BOWEL PROBLEMS  UNUSUAL RASH Items with * indicate a potential emergency and should be followed up as soon as possible.  Feel free to call the clinic should you have any questions or concerns. The clinic phone number is [phone].  Please show the [facility] at check-in to the Emergency Department and triage nurse.  Influenza Virus Vaccine injection What is this medicine? INFLUENZA VIRUS VACCINE (in floo EN zuh VAHY ruhs vak SEEN) helps to reduce the risk of getting influenza also known as the flu. The vaccine only helps protect you against some strains of the flu. This medicine may be used for other purposes; ask your health care provider or pharmacist if you have questions. COMMON BRAND NAME(S): Afluria, Afluria Quadrivalent, Agriflu, Alfuria, FLUAD, Fluarix, Fluarix Quadrivalent, Flublok, Flublok Quadrivalent, FLUCELVAX, FLUCELVAX Quadrivalent, Flulaval, Flulaval Quadrivalent, Fluvirin, Fluzone, Fluzone High-Dose, Fluzone Intradermal, Fluzone Quadrivalent What should I tell my health care provider before I take this medicine? They need to know if you have any of these conditions:  bleeding disorder like hemophilia  fever or infection  Guillain-Barre syndrome  or other neurological problems  immune system problems  infection with the human immunodeficiency virus (HIV) or AIDS  low blood platelet counts  multiple sclerosis  an unusual or allergic reaction to influenza virus vaccine, latex, other medicines, foods, dyes, or preservatives. Different brands of vaccines contain different allergens. Some may contain latex or eggs. Talk to your doctor about your allergies to make sure that you get the right vaccine.  pregnant or trying to get pregnant  breast-feeding How should I use this medicine? This vaccine is for injection into a muscle or under the skin. It is given by a health care professional. A copy of Vaccine Information Statements will be given before each vaccination. Read this sheet carefully each time. The sheet may change frequently. Talk to your healthcare provider to see which vaccines are right for you. Some vaccines should not be used in all age groups. Overdosage: If you think you have taken too much of this medicine contact a poison control center or emergency room at once. NOTE: This medicine is only for you. Do not share this medicine with others. What if I miss a dose? This does not apply. What may interact with this medicine?  chemotherapy or radiation therapy  medicines that lower your immune system like etanercept, anakinra, infliximab, and adalimumab  medicines that treat or prevent blood clots like warfarin  phenytoin  steroid medicines like prednisone or cortisone  theophylline  vaccines This list may not describe all possible interactions. Give your health care provider a list of all the medicines, herbs, non-prescription drugs, or dietary supplements you use. Also tell them if you smoke, drink alcohol, or use illegal drugs. Some items may interact with your medicine. What  should I watch for while using this medicine? Report any side effects that do not go away within 3 days to your doctor or health care  professional. Call your health care provider if any unusual symptoms occur within 6 weeks of receiving this vaccine. You may still catch the flu, but the illness is not usually as bad. You cannot get the flu from the vaccine. The vaccine will not protect against colds or other illnesses that may cause fever. The vaccine is needed every year. What side effects may I notice from receiving this medicine? Side effects that you should report to your doctor or health care professional as soon as possible:  allergic reactions like skin rash, itching or hives, swelling of the face, lips, or tongue Side effects that usually do not require medical attention (report to your doctor or health care professional if they continue or are bothersome):  fever  headache  muscle aches and pains  pain, tenderness, redness, or swelling at the injection site  tiredness This list may not describe all possible side effects. Call your doctor for medical advice about side effects. You may report side effects to FDA at 1-800-FDA-1088. Where should I keep my medicine? The vaccine will be given by a health care professional in a clinic, pharmacy, doctor's office, or other health care setting. You will not be given vaccine doses to store at home. NOTE: This sheet is a summary. It may not cover all possible information. If you have questions about this medicine, talk to your doctor, pharmacist, or health care provider.  2020 [employer] Standard (2017-12-30 08:45:43)

## 2019-12-24 NOTE — Assessment & Plan Note (Signed)
She was found to have iron deficiency anemia and borderline vitamin B12 deficiency After receiving intravenous iron infusion, her blood counts are stable We will continue to monitor closely She is not symptomatic We will proceed without delay

## 2020-01-11 ENCOUNTER — Other Ambulatory Visit: Payer: Self-pay

## 2020-01-11 ENCOUNTER — Other Ambulatory Visit: Payer: Self-pay | Admitting: Hematology and Oncology

## 2020-01-11 ENCOUNTER — Inpatient Hospital Stay (HOSPITAL_BASED_OUTPATIENT_CLINIC_OR_DEPARTMENT_OTHER): Payer: Medicare Other | Admitting: Hematology and Oncology

## 2020-01-11 ENCOUNTER — Encounter: Payer: Self-pay | Admitting: Hematology and Oncology

## 2020-01-11 ENCOUNTER — Inpatient Hospital Stay: Payer: Medicare Other

## 2020-01-11 ENCOUNTER — Telehealth: Payer: Self-pay

## 2020-01-11 DIAGNOSIS — R319 Hematuria, unspecified: Secondary | ICD-10-CM | POA: Diagnosis not present

## 2020-01-11 DIAGNOSIS — C541 Malignant neoplasm of endometrium: Secondary | ICD-10-CM | POA: Diagnosis not present

## 2020-01-11 DIAGNOSIS — C55 Malignant neoplasm of uterus, part unspecified: Secondary | ICD-10-CM

## 2020-01-11 DIAGNOSIS — I825Z2 Chronic embolism and thrombosis of unspecified deep veins of left distal lower extremity: Secondary | ICD-10-CM | POA: Diagnosis not present

## 2020-01-11 DIAGNOSIS — E039 Hypothyroidism, unspecified: Secondary | ICD-10-CM

## 2020-01-11 LAB — CBC WITH DIFFERENTIAL/PLATELET
Abs Immature Granulocytes: 0.02 10*3/uL (ref 0.00–0.07)
Basophils Absolute: 0 10*3/uL (ref 0.0–0.1)
Basophils Relative: 1 %
Eosinophils Absolute: 0.3 10*3/uL (ref 0.0–0.5)
Eosinophils Relative: 7 %
HCT: 38.1 % (ref 36.0–46.0)
Hemoglobin: 12.5 g/dL (ref 12.0–15.0)
Immature Granulocytes: 1 %
Lymphocytes Relative: 30 %
Lymphs Abs: 1.1 10*3/uL (ref 0.7–4.0)
MCH: 30.2 pg (ref 26.0–34.0)
MCHC: 32.8 g/dL (ref 30.0–36.0)
MCV: 92 fL (ref 80.0–100.0)
Monocytes Absolute: 0.4 10*3/uL (ref 0.1–1.0)
Monocytes Relative: 10 %
Neutro Abs: 1.9 10*3/uL (ref 1.7–7.7)
Neutrophils Relative %: 51 %
Platelets: 209 10*3/uL (ref 150–400)
RBC: 4.14 MIL/uL (ref 3.87–5.11)
RDW: 13.2 % (ref 11.5–15.5)
WBC: 3.7 10*3/uL — ABNORMAL LOW (ref 4.0–10.5)
nRBC: 0 % (ref 0.0–0.2)

## 2020-01-11 LAB — COMPREHENSIVE METABOLIC PANEL
ALT: 8 U/L (ref 0–44)
AST: 13 U/L — ABNORMAL LOW (ref 15–41)
Albumin: 3.8 g/dL (ref 3.5–5.0)
Alkaline Phosphatase: 137 U/L — ABNORMAL HIGH (ref 38–126)
Anion gap: 9 (ref 5–15)
BUN: 17 mg/dL (ref 8–23)
CO2: 24 mmol/L (ref 22–32)
Calcium: 9.3 mg/dL (ref 8.9–10.3)
Chloride: 106 mmol/L (ref 98–111)
Creatinine, Ser: 0.7 mg/dL (ref 0.44–1.00)
GFR, Estimated: >60 mL/min (ref >60)
Glucose, Bld: 88 mg/dL (ref 70–99)
Potassium: 3.7 mmol/L (ref 3.5–5.1)
Sodium: 139 mmol/L (ref 135–145)
Total Bilirubin: 0.6 mg/dL (ref 0.3–1.2)
Total Protein: 7.6 g/dL (ref 6.5–8.1)

## 2020-01-11 LAB — URINALYSIS, COMPLETE (UACMP) WITH MICROSCOPIC
Bilirubin Urine: NEGATIVE
Glucose, UA: NEGATIVE mg/dL
Ketones, ur: NEGATIVE mg/dL
Nitrite: NEGATIVE
Protein, ur: 100 mg/dL — AB
RBC / HPF: >50 RBC/hpf — ABNORMAL HIGH (ref 0–5)
Specific Gravity, Urine: 1.017 (ref 1.005–1.030)
WBC, UA: >50 WBC/hpf — ABNORMAL HIGH (ref 0–5)
pH: 5 (ref 5.0–8.0)

## 2020-01-11 LAB — TSH: TSH: 1.679 u[IU]/mL (ref 0.308–3.960)

## 2020-01-11 NOTE — Telephone Encounter (Signed)
She called and left a message to call her.  Called back. She is complaining of pain with urination since 11/14. She kept thinking it would get better. She thinks that she is maybe seeing blood on the tissue when she wipes.

## 2020-01-11 NOTE — Assessment & Plan Note (Signed)
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  I will continue to see her every other treatment for toxicity review If her suprapubic pain is worse, I plan to repeat imaging study sooner than later

## 2020-01-11 NOTE — Progress Notes (Signed)
[facility] OFFICE PROGRESS NOTE  Patient Care Team: Nolene Ebbs, MD as PCP - General (Internal Medicine)  ASSESSMENT & PLAN:  Uterine cancer [facility]) She tolerated treatment very well Her symptom today is not related to side effects of treatment Due to the recurrent disease status, I recommend she stays on pembrolizumab indefinitely and she is in agreement with the plan of care The stability of her CT imaging, I plan to space out the interval imaging studies, the next one would be January 2022.  I will continue to see her every other treatment for toxicity review If her suprapubic pain is worse, I plan to repeat imaging study sooner than later  Hematuria She had mild suprapubic pain and discomfort along with signs of intermittent hematuria I have ordered urinalysis and urine culture I will call her with test results tomorrow If urinalysis or urine culture is positive, the most likely cause of her symptom is due to infection and I will prescribe antibiotic therapy However, if her urinalysis or urine culture is negative, then the most likely cause of her pain is due to cystitis from prior radiation In that instance, she will continue to take her pain medicine as needed  Lower leg DVT (deep venous thromboembolism), chronic, left ([place]) She is taking both anticoagulation therapy and antiplatelet agent She has experienced intermittent epistaxis and occasional hematuria This is not unexpected Her hemoglobin is normal I reassured the patient She will continue her medications as directed   No orders of the defined types were placed in this encounter.   All questions were answered. The patient knows to call the clinic with any problems, questions or concerns. The total time spent in the appointment was 20 minutes encounter with patients including review of chart and various tests results, discussions about plan of care and coordination of care plan   [name],  MD 01/11/2020 2:46 PM  INTERVAL HISTORY: Please see below for problem oriented charting. 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 Uterus +/- tubes/ovaries, neoplastic, with left fallopian tube and ovary - INVASIVE ENDOMETRIOID CARCINOMA (1.5 CM), FIGO GRADE II, ARISING IN A BACKGROUND OF ATYPICAL COMPLEX HYPERPLASIA, CONFINED WITHIN INNER HALF OF THE MYOMETRIUM. - ENDOMETRIAL POLYP WITH ASSOCIATED ATYPICAL COMPLEX HYPERPLASIA. - MYOMETRIUM: LEIOMYOMATA. - CERVIX: BENIGN SQUAMOUS MUCOSA AND ENDOCERVICAL MUCOSA, NO DYSPLASIA OR MALIGNANCY. - LEFT OVARY: BENIGN OVARIAN TISSUE WITH ENDOSALPINGOSIS, NO EVIDENCE OF ATYPIA OR MALIGNANCY. - LEFT FALLOPIAN TUBE: NO HISTOLOGIC ABNORMALITIES. - PLEASE SEE ONCOLOGY TEMPLATE FOR DETAIL. 2. Ovary and fallopian tube, right - BENIGN OVARIAN TISSUE WITH ENDOSALPINGOSIS, NO ATYPIA OR MALIGNANCY. - BENIGN FALLOPIAN TUBAL TISSUE, NO PATHOLOGIC ABNORMALITIES. Microscopic Comment 1. UTERUS Specimen: Uterus, cervix, bilateral ovaries and fallopian tubes Procedure: Total hysterectomy and bilateral salpingo-oophorectomy Lymph node sampling performed: No Specimen integrity: Intact Maximum tumor size (cm): 1.5 cm, glass slide measurement Histologic type: Invasive endometrioid carcinoma Grade: FIGO grade II Myometrial  invasion: 1 cm where myometrium  is 2.3 cm in thickness Cervical stromal involvement: No Extent of involvement of other organs: No Lymph vascular invasion: Not identified Peritoneal washings: Negative ([id number]) Lymph nodes: number examined N/A; number positive N/A TNM code: pT1a, pNX 1 oFf 3IGO Stage (based on pathologic findings, needs clinical correlation): IA  Comments: Sections the endomyometrium away from the grossly identified endometrial polyp show an invasive FIGO grade II endometrioid carcinoma. The tumor is confined within inner half of the myometrium. No angiolymphatic invasion is identified. No cervical stromal involvement is identified. Sections of the grossly identified endometrial polyp show an endometrial polyp with associated atypical compacted hyperplasia with no definitive evidence of carcinoma.   12/07/2017 Imaging   US venous Doppler Right: No evidence of common femoral vein obstruction. Left: Findings consistent with acute deep vein thrombosis involving the left femoral vein, left proximal profunda vein, and left popliteal vein. Unable to adequately interrogate the common femoral and higher, or the calf secondary to significant edema and body habitus   12/07/2017 [facility] Admission   She presented to the ER and was diagnosed with acute DVT   01/18/2018 - 01/21/2018 Hospital Admission   She was admitted to the hospital for management of severe persistent DVT   01/18/2018 Imaging   US venous Doppler Right: No evidence of common femoral vein obstruction. Left: Findings consistent with acute deep vein thrombosis involving the left common femoral vein, and left popliteal vein.   01/19/2018 Surgery   Pre-operative Diagnosis: Subacute DVT with severe post thrombotic syndrome Post-operative diagnosis:  Same Surgeon:  Erlene Quan C. Donzetta Matters, MD Procedure Performed: 1.  Ultrasound-guided cannulation left small saphenous vein 2.  Left lower extremity and central venography 3.  Intravascular ultrasound of  left popliteal, femoral, common femoral, external and common iliac veins and IVC 4.  Stent of left common and external iliac veins with 14 x 60 mm Vici 5.  Moderate sedation with fentanyl and Versed for 50 minutes  Indications: 74 year old female with a history of DVT in October now presents with persistent left lower extremity swelling and ultrasound demonstrating likely persistent DVT.  She has been on Xarelto at this time.  She is now indicated for venogram possible intervention.  Findings: Flow in the left lower extremity was stagnant throughout but by venogram all veins were patent.  There was a focal occlusive area approximately 2 cm in length at the common and external iliac vein junction at the hypogastric on the left.  After stenting and ballooning we had a diameter of 12 millimeters in the stent and venogram demonstrated flow in the lower extremity veins were previously was stagnant and no further residual stenosis in the left common and external iliac vein junction.   04/12/2018 Imaging   US Venous Doppler Right: No evidence of common femoral vein obstruction. Left: There is no evidence of deep vein thrombosis in the lower extremity. However, portions of this examination were limited- see technologist comments above. Left groin: Large hypoechoic area with mixed echoes noted measuring nearly 10 cm. Possible  hematoma versus unknown etiology. Ultrasound characteristics of enlarged lymph nodes noted in the groin.      05/15/2018 Imaging   US Venous Doppler Right: No evidence of deep vein thrombosis in the lower extremity. No indirect evidence of obstruction proximal to the inguinal ligament. Left: No reflux was noted in the common femoral vein , femoral vein in the thigh, popliteal vein, great saphenous vein at the saphenofemoral junction, great saphenous vein at  the proximal thigh, great saphenous vein at the mid thigh, great saphenous vein  at the distal thigh, great saphenous vein at  the knee, origin of the small saphenous vein, proximal small saphenous vein, and mid small saphenous vein. There is no evidence of deep vein thrombosis in the lower extremity. There is no evidence of superficial venous thrombosis. No cystic structure found in the popliteal fossa. Unable to evaluate extension of common femoral vein obstruction proximal to the inguinal ligament.   06/01/2018 Imaging   1. 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 Additionally, there is lucency involving the medial sidewall of the left acetabulum with potential nondisplaced pathologic fracture. Further evaluation with contrast-enhanced pelvic MRI could be performed as clinically indicated. 2. The left pelvic arterial and venous system is encased by this infiltrative left pelvic sidewall mass however while difficult to ascertain, the left external iliac venous stent appears patent.   06/18/2018 Pathology Results   Lymph node for lymphoma, Left Inguinal - METASTATIC ADENOCARCINOMA, SEE COMMENT. Microscopic Comment Immunohistochemistry is positive for cytokeratin 7, PAX8, ER, and PR. Cytokeratin 5/6,and p63 are negative. The immunoprofile along with the patient's history are consistent with a gynecologic primary.   06/18/2018 Surgery   Pre-op Diagnosis: INGUINAL LYMPHADENOPATHY, PELVIC MASS     Procedure(s): EXCISIONAL BIOPSY DEEP LEFT INGUINAL LYMPH NODE  Surgeon(s): Coralie Keens, MD    06/24/2018 Cancer Staging   Staging form: Corpus Uteri - Carcinoma and Carcinosarcoma, AJCC 8th Edition - Clinical: Stage IVB (cT1a, cN2, pM1) - Signed by [name], MD on 06/24/2018    Genetic Testing   Patient has genetic testing done for MMR on pathology from 06/18/2018. Results revealed patient has the following mutation(s): MMR: abnormal   06/29/2018 Procedure   Placement of a subcutaneous port device. Catheter tip  at the SVC and right atrium junction.    Genetic Testing   Patient has genetic testing done for MSI on pathology from 06/18/2018. Results revealed patient has the following mutation(s): MSI: High   07/02/2018 PET scan   Previous hysterectomy, with asymmetric focus of hypermetabolic activity in the left vaginal cuff, suspicious for residual or recurrent carcinoma.  Large hypermetabolic soft tissue mass involving the left pelvic sidewall and acetabulum, consistent with metastatic disease.  No evidence metastatic disease within the abdomen, chest, or neck.   07/09/2018 Tumor Marker   Patient's tumor was tested for the following markers: CA-125 Results of the tumor marker test revealed 9   07/10/2018 - 08/24/2018 Chemotherapy   The patient had carboplatin and taxol x 3 cycles   07/17/2018 Genetic Testing   Negative genetic testing on the common hereditary cancer panel.  The Common Hereditary Gene Panel offered by [facility] includes sequencing and/or deletion duplication testing of the following 48 genes: APC, ATM, AXIN2, BARD1, BMPR1A, BRCA1, BRCA2, BRIP1, CDH1, CDK4, CDKN2A (p14ARF), CDKN2A (p16INK4a), CHEK2, CTNNA1, DICER1, EPCAM (Deletion/duplication testing only), GREM1 (promoter region deletion/duplication testing only), KIT, MEN1, MLH1, MSH2, MSH3, MSH6, MUTYH, NBN, NF1, NHTL1, PALB2, PDGFRA, PMS2, POLD1, POLE, PTEN, RAD50, RAD51C, RAD51D, RNF43, SDHB, SDHC, SDHD, SMAD4, SMARCA4. STK11, TP53, TSC1, TSC2, and VHL.  The following genes were evaluated for sequence changes only: SDHA and HOXB13 c.251G>A variant only. The report date is Jul 17, 2018.    10/03/2018 Imaging   CT abdomen and pelvis 1.  No acute intra-abdominal process. 2. Grossly unchanged left pelvic sidewall mass with osseous involvement of the medial acetabulum. Progressive mild displacement  of the associated comminuted pathologic fracture involving the right acetabulum and puboacetabular junction.  3. New venous stents extending  from the left common iliac vein origin to the proximal left common femoral vein. The stents are patent.   11/06/2018 -  Chemotherapy   The patient had pembrolizumab for chemotherapy treatment.     01/28/2019 Imaging   1. No substantial interval change in exam. 2. Interval development of mild fullness in the left intrarenal collecting system and ureter without overt hydronephrosis at this time. 3. Abnormal soft tissue along the left pelvic sidewall has decreased slightly in the interval. 4. Similar appearance of ill-defined fascial planes in the pelvis with some peritoneal thickening along the right pelvic sidewall and potentially involving the sigmoid mesocolon. 5. No substantial ascites.   05/03/2019 Imaging   1. Stable mild left pelvic sidewall soft tissue density. No new or progressive disease identified within the abdomen or pelvis.  2. Colonic diverticulosis. No radiographic evidence of diverticulitis.   Aortic Atherosclerosis (ICD10-I70.0).   09/09/2019 Imaging   1. No change in appearance of soft tissue thickening along the LEFT pelvic sidewall adjacent to chronic LEFT acetabular fracture. 2. Mild asymmetry of the bladder wall favoring the LEFT bladder wall, not well assessed. Similar accounting for variable degrees of distension on prior studies potentially related to prior radiation, attention on follow-up. 3. Signs of venous stenting in the LEFT hemipelvis with LEFT lower extremity muscular atrophy and mild stranding with similar appearance. Signs of colonic diverticulosis and diverticular disease without change.   Metastasis to lymph nodes ([place])  06/23/2018 Initial Diagnosis   Metastasis to lymph nodes ([place])   07/10/2018 - 08/24/2018 Chemotherapy   The patient had palonosetron (ALOXI) injection 0.25 mg, 0.25 mg, Intravenous,  Once, 3 of 6 cycles Administration: 0.25 mg (07/10/2018), 0.25 mg (07/31/2018), 0.25 mg (08/24/2018) CARBOplatin (PARAPLATIN) 480 mg in sodium chloride 0.9 % 250  mL chemo infusion, 480 mg (100 % of original dose 482.5 mg), Intravenous,  Once, 3 of 6 cycles Dose modification: 482.5 mg (original dose 482.5 mg, Cycle 1) Administration: 480 mg (07/10/2018), 480 mg (07/31/2018), 480 mg (08/24/2018) PACLitaxel (TAXOL) 276 mg in sodium chloride 0.9 % 250 mL chemo infusion (> 64m/m2), 140 mg/m2 = 276 mg (80 % of original dose 175 mg/m2), Intravenous,  Once, 3 of 6 cycles Dose modification: 140 mg/m2 (80 % of original dose 175 mg/m2, Cycle 1, Reason: Dose Not Tolerated) Administration: 276 mg (07/10/2018), 276 mg (07/31/2018), 276 mg (08/24/2018) fosaprepitant (EMEND) 150 mg, dexamethasone (DECADRON) 12 mg in sodium chloride 0.9 % 145 mL IVPB, , Intravenous,  Once, 3 of 6 cycles Administration:  (07/10/2018),  (07/31/2018),  (08/24/2018)  for chemotherapy treatment.    11/06/2018 -  Chemotherapy   The patient had pembrolizumab for chemotherapy treatment.     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  06/24/2018 Initial Diagnosis   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   07/10/2018 - 08/24/2018 Chemotherapy   The patient had palonosetron (ALOXI) injection 0.25 mg, 0.25 mg, Intravenous,  Once, 3 of 6 cycles Administration: 0.25 mg (07/10/2018), 0.25 mg (07/31/2018), 0.25 mg (08/24/2018) CARBOplatin (PARAPLATIN) 480 mg in sodium chloride 0.9 % 250 mL chemo infusion, 480 mg (100 % of original dose 482.5 mg), Intravenous,  Once, 3 of 6 cycles Dose modification: 482.5 mg (original dose 482.5 mg, Cycle 1) Administration: 480 mg (07/10/2018), 480 mg (07/31/2018), 480 mg (08/24/2018) PACLitaxel (TAXOL) 276 mg in sodium chloride 0.9 % 250 mL chemo infusion (> 819mm2), 140 mg/m2 = 276 mg (80 %  of original dose 175 mg/m2), Intravenous,  Once, 3 of 6 cycles Dose modification: 140 mg/m2 (80 % of original dose 175 mg/m2, Cycle 1, Reason: Dose Not Tolerated) Administration: 276 mg (07/10/2018), 276 mg (07/31/2018), 276 mg (08/24/2018) fosaprepitant (EMEND) 150 mg, dexamethasone (DECADRON) 12 mg in sodium chloride 0.9 % 145 mL  IVPB, , Intravenous,  Once, 3 of 6 cycles Administration:  (07/10/2018),  (07/31/2018),  (08/24/2018)  for chemotherapy treatment.    11/06/2018 -  Chemotherapy   The patient had pembrolizumab for chemotherapy treatment.     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       REVIEW OF SYSTEMS:   Constitutional: Denies fevers, chills or abnormal weight loss Eyes: Denies blurriness of vision Ears, nose, mouth, throat, and face: Denies mucositis or sore throat Respiratory: Denies cough, dyspnea or wheezes Cardiovascular: Denies palpitation, chest discomfort or lower extremity swelling Gastrointestinal:  Denies nausea, heartburn or change in bowel habits Skin: Denies abnormal skin rashes Lymphatics: Denies new lymphadenopathy or easy bruising Neurological:Denies numbness, tingling or new weaknesses Behavioral/Psych: Mood is stable, no new changes  All other systems were reviewed with the patient and are negative.  I have reviewed the past medical history, past surgical history, social history and family history with the patient and they are unchanged from previous note.  ALLERGIES:  has No Known Allergies.  MEDICATIONS:  Current Outpatient Medications  Medication Sig Dispense Refill  . clopidogrel (PLAVIX) 75 MG tablet Take 1 tablet (75 mg total) by mouth daily with breakfast. 30 tablet 1  . diclofenac sodium (VOLTAREN) 1 % GEL APPLY 4GRAMS 4 TIMES A DAY AS NEEDED FOR PAINS    . Eliquis DVT/PE Starter Pack (ELIQUIS STARTER PACK) 5 MG TABS Take as directed on package: start with two-73m tablets twice daily for 7 days. On day 8, switch to one-550mtablet twice daily. 1 each 0  . gabapentin (NEURONTIN) 300 MG capsule Take 300 mg by mouth 2 (two)  times daily.    . methadone (DOLOPHINE) 10 MG tablet Take 1 tablet (10 mg total) by mouth every 12 (twelve) hours. 60 tablet 0  . morphine (MSIR) 15 MG tablet Take 1 tablet (15 mg total) by mouth every 6 (six) hours as needed for severe pain. 60 tablet 0  . Olopatadine HCl 0.2 % SOLN Place 1 drop into both eyes daily.     No current facility-administered medications for this visit.    PHYSICAL EXAMINATION: ECOG PERFORMANCE STATUS: 1 - Symptomatic but completely ambulatory  Vitals:   01/11/20 1410  BP: 121/60  Pulse: 85  Resp: 18  Temp: 98 F (36.7 C)  SpO2: 100%   Filed Weights   01/11/20 1410  Weight: 204 lb 9.6 oz (92.8 kg)    GENERAL:alert, no distress and comfortable Musculoskeletal:no cyanosis of digits and no clubbing  NEURO: alert & oriented x 3 with fluent speech, no focal motor/sensory deficits  LABORATORY DATA:  I have reviewed the data as listed    Component Value Date/Time   NA 140 12/24/2019 1023   K 3.8 12/24/2019 1023   CL 107 12/24/2019 1023   CO2 24 12/24/2019 1023   GLUCOSE 101 (H) 12/24/2019 1023   BUN 16 12/24/2019 1023   CREATININE 0.66 12/24/2019 1023   CALCIUM 9.2 12/24/2019 1023   PROT 7.3  12/24/2019 1023   ALBUMIN 3.7 12/24/2019 1023   AST 11 (L) 12/24/2019 1023   ALT 7 12/24/2019 1023   ALKPHOS 142 (H) 12/24/2019 1023   BILITOT 0.6 12/24/2019 1023   GFRNONAA >60 12/24/2019 1023   GFRAA >60 11/12/2019 1222    No results found for: SPEP, UPEP  Lab Results  Component Value Date   WBC 3.7 (L) 01/11/2020   NEUTROABS 1.9 01/11/2020   HGB 12.5 01/11/2020   HCT 38.1 01/11/2020   MCV 92.0 01/11/2020   PLT 209 01/11/2020      Chemistry      Component Value Date/Time   NA 140 12/24/2019 1023   K 3.8 12/24/2019 1023   CL 107 12/24/2019 1023   CO2 24 12/24/2019 1023   BUN 16 12/24/2019 1023   CREATININE 0.66 12/24/2019 1023      Component Value Date/Time   CALCIUM 9.2 12/24/2019 1023   ALKPHOS 142 (H) 12/24/2019 1023   AST 11  (L) 12/24/2019 1023   ALT 7 12/24/2019 1023   BILITOT 0.6 12/24/2019 1023

## 2020-01-11 NOTE — Telephone Encounter (Signed)
Called and scheduled lab appt at 1:30 pm and then see Dr. Alvy Bimler for today. Given below message. She verbalized understanding.

## 2020-01-11 NOTE — Assessment & Plan Note (Signed)
She had mild suprapubic pain and discomfort along with signs of intermittent hematuria I have ordered urinalysis and urine culture I will call her with test results tomorrow If urinalysis or urine culture is positive, the most likely cause of her symptom is due to infection and I will prescribe antibiotic therapy However, if her urinalysis or urine culture is negative, then the most likely cause of her pain is due to cystitis from prior radiation In that instance, she will continue to take her pain medicine as needed

## 2020-01-11 NOTE — Assessment & Plan Note (Signed)
She is taking both anticoagulation therapy and antiplatelet agent She has experienced intermittent epistaxis and occasional hematuria This is not unexpected Her hemoglobin is normal I reassured the patient She will continue her medications as directed

## 2020-01-11 NOTE — Telephone Encounter (Signed)
I can see her at 2 pm today Labs and see me 20 mins. I will put order for UA and culture. Tell her to save some urine before checking in

## 2020-01-12 ENCOUNTER — Other Ambulatory Visit: Payer: Self-pay | Admitting: Hematology and Oncology

## 2020-01-12 ENCOUNTER — Telehealth: Payer: Self-pay

## 2020-01-12 DIAGNOSIS — R319 Hematuria, unspecified: Secondary | ICD-10-CM

## 2020-01-12 MED ORDER — AMOXICILLIN 500 MG PO TABS: 500.0000 mg | ORAL_TABLET | Freq: Two times a day (BID) | ORAL | 0 refills | Status: DC

## 2020-01-12 NOTE — Telephone Encounter (Signed)
-----   Message from [name], MD sent at 01/12/2020 10:46 AM EST ----- Regarding: possible UTI Pls let her know culture is pending but prelim UA is suspicious for UTI I sent antibiotics to her pharmacy, start ASAp twice daily If culture is different we might call her again on Friday and change the antibiotics

## 2020-01-12 NOTE — Telephone Encounter (Signed)
Called and given below message. She verbalized understanding. She will start antibiotic today as soon as she picks it up.

## 2020-01-14 ENCOUNTER — Inpatient Hospital Stay: Payer: Medicare Other

## 2020-01-14 ENCOUNTER — Other Ambulatory Visit: Payer: Self-pay

## 2020-01-14 VITALS — BP 133/60 | HR 87 | Temp 97.9°F | Resp 18

## 2020-01-14 DIAGNOSIS — C774 Secondary and unspecified malignant neoplasm of inguinal and lower limb lymph nodes: Secondary | ICD-10-CM

## 2020-01-14 DIAGNOSIS — C801 Malignant (primary) neoplasm, unspecified: Secondary | ICD-10-CM

## 2020-01-14 DIAGNOSIS — Z7189 Other specified counseling: Secondary | ICD-10-CM

## 2020-01-14 DIAGNOSIS — C55 Malignant neoplasm of uterus, part unspecified: Secondary | ICD-10-CM

## 2020-01-14 DIAGNOSIS — C541 Malignant neoplasm of endometrium: Secondary | ICD-10-CM | POA: Diagnosis not present

## 2020-01-14 DIAGNOSIS — C7951 Secondary malignant neoplasm of bone: Secondary | ICD-10-CM

## 2020-01-14 LAB — URINE CULTURE: Culture: 60000 — AB

## 2020-01-14 MED ORDER — HEPARIN SOD (PORK) LOCK FLUSH 100 UNIT/ML IV SOLN
500.0000 [IU] | Freq: Once | INTRAVENOUS | Status: AC | PRN
  Administered 2020-01-14: 500 [IU]
  Filled 2020-01-14: qty 5

## 2020-01-14 MED ORDER — SODIUM CHLORIDE 0.9% FLUSH
10.0000 mL | INTRAVENOUS | Status: DC | PRN
  Administered 2020-01-14: 10 mL
  Filled 2020-01-14: qty 10

## 2020-01-14 MED ORDER — SODIUM CHLORIDE 0.9 % IV SOLN
200.0000 mg | Freq: Once | INTRAVENOUS | Status: AC
  Administered 2020-01-14: 200 mg via INTRAVENOUS
  Filled 2020-01-14: qty 8

## 2020-01-14 MED ORDER — SODIUM CHLORIDE 0.9 % IV SOLN
Freq: Once | INTRAVENOUS | Status: AC
  Filled 2020-01-14: qty 250

## 2020-01-14 NOTE — Patient Instructions (Signed)
[facility] Discharge Instructions for Patients Receiving Chemotherapy  Today you received the following chemotherapy agents: pembrolizumab.  To help prevent nausea and vomiting after your treatment, we encourage you to take your nausea medication as directed.   If you develop nausea and vomiting that is not controlled by your nausea medication, call the clinic.   BELOW ARE SYMPTOMS THAT SHOULD BE REPORTED IMMEDIATELY:  *FEVER GREATER THAN 100.5 F  *CHILLS WITH OR WITHOUT FEVER  NAUSEA AND VOMITING THAT IS NOT CONTROLLED WITH YOUR NAUSEA MEDICATION  *UNUSUAL SHORTNESS OF BREATH  *UNUSUAL BRUISING OR BLEEDING  TENDERNESS IN MOUTH AND THROAT WITH OR WITHOUT PRESENCE OF ULCERS  *URINARY PROBLEMS  *BOWEL PROBLEMS  UNUSUAL RASH Items with * indicate a potential emergency and should be followed up as soon as possible.  Feel free to call the clinic should you have any questions or concerns. The clinic phone number is [phone].  Please show the CHEMO ALERT CARD at check-in to the Emergency Department and triage nurse.   

## 2020-01-17 ENCOUNTER — Telehealth: Payer: Self-pay

## 2020-01-17 NOTE — Telephone Encounter (Signed)
Called and told her the office will call in a few days to check on her. Ask her to call the office back if needed for worsening symptoms. She verbalized understanding.

## 2020-01-17 NOTE — Telephone Encounter (Signed)
Called and given below message. She verbalized understanding..She is feeling better. The burning and pain has gone away.  She is seeing blood in the toilet when she urinates. She is wearing a pad now and seeing blood on the pad.

## 2020-01-17 NOTE — Telephone Encounter (Signed)
Called and given below message. She verbalized understanding. She is taking 2.5 mg Eliquis BID.

## 2020-01-17 NOTE — Telephone Encounter (Signed)
Can you call to verify her eliquis dose? It should be 2.5 mg BID If not, we need to cut down the dose

## 2020-01-17 NOTE — Telephone Encounter (Signed)
-----   Message from [name], MD sent at 01/17/2020  7:58 AM EST ----- Regarding: recent UTI Can you call and ask if her UTI symtpoms are better?

## 2020-01-17 NOTE — Telephone Encounter (Signed)
Pls update her med list I will call her again in a few days

## 2020-01-20 ENCOUNTER — Telehealth: Payer: Self-pay

## 2020-01-20 NOTE — Telephone Encounter (Signed)
-----   Message from [name], MD sent at 01/20/2020 10:55 AM EST ----- Regarding: non urgent call Is her symptom better? From recent UTI

## 2020-01-20 NOTE — Telephone Encounter (Signed)
Called and given below message. She verbalized understanding. She denies UTI symptoms and she denies bleeding. She will call the office back if needed.

## 2020-02-04 ENCOUNTER — Encounter: Payer: Self-pay | Admitting: Hematology and Oncology

## 2020-02-04 ENCOUNTER — Other Ambulatory Visit: Payer: Self-pay

## 2020-02-04 ENCOUNTER — Inpatient Hospital Stay: Payer: Medicare Other | Attending: Hematology and Oncology

## 2020-02-04 ENCOUNTER — Inpatient Hospital Stay (HOSPITAL_BASED_OUTPATIENT_CLINIC_OR_DEPARTMENT_OTHER): Payer: Medicare Other | Admitting: Hematology and Oncology

## 2020-02-04 ENCOUNTER — Inpatient Hospital Stay: Payer: Medicare Other

## 2020-02-04 VITALS — BP 154/67 | HR 98 | Temp 98.6°F | Resp 18 | Ht 61.0 in | Wt 212.6 lb

## 2020-02-04 DIAGNOSIS — Z79899 Other long term (current) drug therapy: Secondary | ICD-10-CM | POA: Insufficient documentation

## 2020-02-04 DIAGNOSIS — C55 Malignant neoplasm of uterus, part unspecified: Secondary | ICD-10-CM

## 2020-02-04 DIAGNOSIS — G893 Neoplasm related pain (acute) (chronic): Secondary | ICD-10-CM | POA: Diagnosis not present

## 2020-02-04 DIAGNOSIS — C7951 Secondary malignant neoplasm of bone: Secondary | ICD-10-CM | POA: Diagnosis not present

## 2020-02-04 DIAGNOSIS — D61818 Other pancytopenia: Secondary | ICD-10-CM | POA: Diagnosis not present

## 2020-02-04 DIAGNOSIS — D509 Iron deficiency anemia, unspecified: Secondary | ICD-10-CM | POA: Insufficient documentation

## 2020-02-04 DIAGNOSIS — E039 Hypothyroidism, unspecified: Secondary | ICD-10-CM

## 2020-02-04 DIAGNOSIS — Z5112 Encounter for antineoplastic immunotherapy: Secondary | ICD-10-CM | POA: Diagnosis not present

## 2020-02-04 DIAGNOSIS — K5730 Diverticulosis of large intestine without perforation or abscess without bleeding: Secondary | ICD-10-CM | POA: Insufficient documentation

## 2020-02-04 DIAGNOSIS — Z7901 Long term (current) use of anticoagulants: Secondary | ICD-10-CM | POA: Diagnosis not present

## 2020-02-04 DIAGNOSIS — C541 Malignant neoplasm of endometrium: Secondary | ICD-10-CM | POA: Insufficient documentation

## 2020-02-04 DIAGNOSIS — Z9221 Personal history of antineoplastic chemotherapy: Secondary | ICD-10-CM | POA: Diagnosis not present

## 2020-02-04 DIAGNOSIS — C801 Malignant (primary) neoplasm, unspecified: Secondary | ICD-10-CM

## 2020-02-04 DIAGNOSIS — Z7189 Other specified counseling: Secondary | ICD-10-CM

## 2020-02-04 DIAGNOSIS — C774 Secondary and unspecified malignant neoplasm of inguinal and lower limb lymph nodes: Secondary | ICD-10-CM

## 2020-02-04 LAB — CBC WITH DIFFERENTIAL/PLATELET
Abs Immature Granulocytes: 0.01 10*3/uL (ref 0.00–0.07)
Basophils Absolute: 0 10*3/uL (ref 0.0–0.1)
Basophils Relative: 1 %
Eosinophils Absolute: 0.3 10*3/uL (ref 0.0–0.5)
Eosinophils Relative: 8 %
HCT: 36.2 % (ref 36.0–46.0)
Hemoglobin: 11.9 g/dL — ABNORMAL LOW (ref 12.0–15.0)
Immature Granulocytes: 0 %
Lymphocytes Relative: 25 %
Lymphs Abs: 0.9 10*3/uL (ref 0.7–4.0)
MCH: 30.1 pg (ref 26.0–34.0)
MCHC: 32.9 g/dL (ref 30.0–36.0)
MCV: 91.6 fL (ref 80.0–100.0)
Monocytes Absolute: 0.4 10*3/uL (ref 0.1–1.0)
Monocytes Relative: 10 %
Neutro Abs: 1.9 10*3/uL (ref 1.7–7.7)
Neutrophils Relative %: 56 %
Platelets: 211 10*3/uL (ref 150–400)
RBC: 3.95 MIL/uL (ref 3.87–5.11)
RDW: 13.2 % (ref 11.5–15.5)
WBC: 3.5 10*3/uL — ABNORMAL LOW (ref 4.0–10.5)
nRBC: 0 % (ref 0.0–0.2)

## 2020-02-04 LAB — COMPREHENSIVE METABOLIC PANEL
ALT: 9 U/L (ref 0–44)
AST: 12 U/L — ABNORMAL LOW (ref 15–41)
Albumin: 3.6 g/dL (ref 3.5–5.0)
Alkaline Phosphatase: 132 U/L — ABNORMAL HIGH (ref 38–126)
Anion gap: 8 (ref 5–15)
BUN: 15 mg/dL (ref 8–23)
CO2: 24 mmol/L (ref 22–32)
Calcium: 9.3 mg/dL (ref 8.9–10.3)
Chloride: 106 mmol/L (ref 98–111)
Creatinine, Ser: 0.71 mg/dL (ref 0.44–1.00)
GFR, Estimated: >60 mL/min (ref >60)
Glucose, Bld: 96 mg/dL (ref 70–99)
Potassium: 3.8 mmol/L (ref 3.5–5.1)
Sodium: 138 mmol/L (ref 135–145)
Total Bilirubin: 0.6 mg/dL (ref 0.3–1.2)
Total Protein: 7.4 g/dL (ref 6.5–8.1)

## 2020-02-04 LAB — TSH: TSH: 1.97 u[IU]/mL (ref 0.308–3.960)

## 2020-02-04 MED ORDER — SODIUM CHLORIDE 0.9% FLUSH
10.0000 mL | INTRAVENOUS | Status: DC | PRN
  Administered 2020-02-04: 10 mL
  Filled 2020-02-04: qty 10

## 2020-02-04 MED ORDER — SODIUM CHLORIDE 0.9 % IV SOLN
200.0000 mg | Freq: Once | INTRAVENOUS | Status: AC
  Administered 2020-02-04: 200 mg via INTRAVENOUS
  Filled 2020-02-04: qty 8

## 2020-02-04 MED ORDER — HEPARIN SOD (PORK) LOCK FLUSH 100 UNIT/ML IV SOLN
500.0000 [IU] | Freq: Once | INTRAVENOUS | Status: AC | PRN
  Administered 2020-02-04: 500 [IU]
  Filled 2020-02-04: qty 5

## 2020-02-04 MED ORDER — SODIUM CHLORIDE 0.9 % IV SOLN
Freq: Once | INTRAVENOUS | Status: AC
  Filled 2020-02-04: qty 250

## 2020-02-04 NOTE — Assessment & Plan Note (Signed)
She tolerated treatment very well I plan to proceed with imaging study next month before she come back for treatment for objective assessment of response to therapy

## 2020-02-04 NOTE — Assessment & Plan Note (Signed)
She was found to have iron deficiency anemia and borderline vitamin B12 deficiency After receiving intravenous iron infusion, her blood counts are stable We will continue to monitor closely She is not symptomatic We will proceed without delay

## 2020-02-04 NOTE — Assessment & Plan Note (Signed)
She has reasonable pain control She will continue current prescribed morphine sulfate and methadone as needed We discussed narcotic refill policy 

## 2020-02-04 NOTE — Progress Notes (Signed)
[place] OFFICE PROGRESS NOTE  Patient Care Team: Nolene Ebbs, MD as PCP - General (Internal Medicine)  ASSESSMENT & PLAN:  Uterine cancer [facility]) She tolerated treatment very well I plan to proceed with imaging study next month before she come back for treatment for objective assessment of response to therapy  Pancytopenia, acquired ([place]) She was found to have iron deficiency anemia and borderline vitamin B12 deficiency After receiving intravenous iron infusion, her blood counts are stable We will continue to monitor closely She is not symptomatic We will proceed without delay  Cancer associated pain She has reasonable pain control She will continue current prescribed morphine sulfate and methadone as needed We discussed narcotic refill policy   Orders Placed This Encounter  Procedures  . CT ABDOMEN PELVIS W CONTRAST    Standing Status:   Future    Standing Expiration Date:   02/03/2021    Order Specific Question:   If indicated for the ordered procedure, I authorize the administration of contrast media per Radiology protocol    Answer:   Yes    Order Specific Question:   Preferred imaging location?    Answer:   [facility]    Order Specific Question:   Radiology Contrast Protocol - do NOT remove file path    Answer:   [email or web address]    All questions were answered. The patient knows to call the clinic with any problems, questions or concerns. The total time spent in the appointment was 20 minutes encounter with patients including review of chart and various tests results, discussions about plan of care and coordination of care plan   [name], MD 02/04/2020 10:44 AM  INTERVAL HISTORY: Please see below for problem oriented charting. She returns for treatment and follow-up She tolerated last cycle well Her UTI has completely resolved Her chronic pain is stable The patient denies any recent signs or  symptoms of bleeding such as spontaneous epistaxis, hematuria or hematochezia.  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 Uterus +/- tubes/ovaries, neoplastic, with left fallopian tube and ovary - INVASIVE ENDOMETRIOID CARCINOMA (1.5 CM), FIGO GRADE II, ARISING IN A BACKGROUND OF ATYPICAL COMPLEX HYPERPLASIA, CONFINED WITHIN INNER HALF OF THE MYOMETRIUM. - ENDOMETRIAL POLYP WITH ASSOCIATED ATYPICAL COMPLEX HYPERPLASIA. - MYOMETRIUM: LEIOMYOMATA. - CERVIX: BENIGN SQUAMOUS MUCOSA AND ENDOCERVICAL MUCOSA, NO DYSPLASIA OR MALIGNANCY. - LEFT OVARY: BENIGN OVARIAN TISSUE WITH ENDOSALPINGOSIS, NO EVIDENCE OF ATYPIA OR MALIGNANCY. - LEFT FALLOPIAN TUBE: NO HISTOLOGIC ABNORMALITIES. - PLEASE SEE ONCOLOGY TEMPLATE FOR DETAIL. 2. Ovary and fallopian tube, right - BENIGN OVARIAN TISSUE WITH ENDOSALPINGOSIS, NO ATYPIA OR MALIGNANCY. - BENIGN FALLOPIAN TUBAL TISSUE, NO PATHOLOGIC ABNORMALITIES. Microscopic Comment 1. UTERUS Specimen: Uterus, cervix, bilateral ovaries and fallopian tubes Procedure: Total hysterectomy and bilateral salpingo-oophorectomy Lymph node sampling performed: No Specimen integrity: Intact Maximum tumor size (cm): 1.5 cm, glass slide measurement Histologic type: Invasive endometrioid carcinoma Grade: FIGO grade II Myometrial invasion: 1 cm where myometrium is 2.3 cm in thickness Cervical stromal involvement: No Extent of involvement of other organs: No Lymph vascular invasion: Not identified Peritoneal washings: Negative ([id number]) Lymph nodes: number examined N/A; number positive N/A TNM code: pT1a, pNX 1 oFf 3IGO Stage (based on pathologic findings, needs clinical correlation): IA  Comments: Sections the endomyometrium away from the grossly identified  endometrial  polyp show an invasive FIGO grade II endometrioid carcinoma. The tumor is confined within inner half of the myometrium. No angiolymphatic invasion is identified. No cervical stromal involvement is identified. Sections of the grossly identified endometrial polyp show an endometrial polyp with associated atypical compacted hyperplasia with no definitive evidence of carcinoma.   12/07/2017 Imaging   US venous Doppler Right: No evidence of common femoral vein obstruction. Left: Findings consistent with acute deep vein thrombosis involving the left femoral vein, left proximal profunda vein, and left popliteal vein. Unable to adequately interrogate the common femoral and higher, or the calf secondary to significant edema and body habitus   12/07/2017 [facility] Admission   She presented to the ER and was diagnosed with acute DVT   01/18/2018 - 01/21/2018 Hospital Admission   She was admitted to the hospital for management of severe persistent DVT   01/18/2018 Imaging   US venous Doppler Right: No evidence of common femoral vein obstruction. Left: Findings consistent with acute deep vein thrombosis involving the left common femoral vein, and left popliteal vein.   01/19/2018 Surgery   Pre-operative Diagnosis: Subacute DVT with severe post thrombotic syndrome Post-operative diagnosis:  Same Surgeon:  Erlene Quan C. Donzetta Matters, MD Procedure Performed: 1.  Ultrasound-guided cannulation left small saphenous vein 2.  Left lower extremity and central venography 3.  Intravascular ultrasound of left popliteal, femoral, common femoral, external and common iliac veins and IVC 4.  Stent of left common and external iliac veins with 14 x 60 mm Vici 5.  Moderate sedation with fentanyl and Versed for 50 minutes  Indications: 74 year old female with a history of DVT in October now presents with persistent left lower extremity swelling and ultrasound demonstrating likely persistent DVT.  She has been on Xarelto  at this time.  She is now indicated for venogram possible intervention.  Findings: Flow in the left lower extremity was stagnant throughout but by venogram all veins were patent.  There was a focal occlusive area approximately 2 cm in length at the common and external iliac vein junction at the hypogastric on the left.  After stenting and ballooning we had a diameter of 12 millimeters in the stent and venogram demonstrated flow in the lower extremity veins were previously was stagnant and no further residual stenosis in the left common and external iliac vein junction.   04/12/2018 Imaging   US Venous Doppler Right: No evidence of common femoral vein obstruction. Left: There is no evidence of deep vein thrombosis in the lower extremity. However, portions of this examination were limited- see technologist comments above. Left groin: Large hypoechoic area with mixed echoes noted measuring nearly 10 cm. Possible  hematoma versus unknown etiology. Ultrasound characteristics of enlarged lymph nodes noted in the groin.      05/15/2018 Imaging   US Venous Doppler Right: No evidence of deep vein thrombosis in the lower extremity. No indirect evidence of obstruction proximal to the inguinal ligament. Left: No reflux was noted in the common femoral vein , femoral vein in the thigh, popliteal vein, great saphenous vein at the saphenofemoral junction, great saphenous vein at the proximal thigh, great saphenous vein at the mid thigh, great saphenous vein  at the distal thigh, great saphenous vein at the knee, origin of the small saphenous vein, proximal small saphenous vein, and mid small saphenous vein. There is no evidence of deep vein thrombosis in the lower extremity. There is no evidence of superficial venous thrombosis. No cystic structure found in the popliteal  fossa. Unable to evaluate extension of common femoral vein obstruction proximal to the inguinal ligament.   06/01/2018 Imaging   1. Infiltrative  mass within the left pelvic sidewall measuring approximately 9.5 cm with associated pathologically enlarged left inguinal lymph node. Additionally, there is lucency involving the medial sidewall of the left acetabulum with potential nondisplaced pathologic fracture. Further evaluation with contrast-enhanced pelvic MRI could be performed as clinically indicated. 2. The left pelvic arterial and venous system is encased by this infiltrative left pelvic sidewall mass however while difficult to ascertain, the left external iliac venous stent appears patent.   06/18/2018 Pathology Results   Lymph node for lymphoma, Left Inguinal - METASTATIC ADENOCARCINOMA, SEE COMMENT. Microscopic Comment Immunohistochemistry is positive for cytokeratin 7, PAX8, ER, and PR. Cytokeratin 5/6,and p63 are negative. The immunoprofile along with the patient's history are consistent with a gynecologic primary.   06/18/2018 Surgery   Pre-op Diagnosis: INGUINAL LYMPHADENOPATHY, PELVIC MASS     Procedure(s): EXCISIONAL BIOPSY DEEP LEFT INGUINAL LYMPH NODE  Surgeon(s): Coralie Keens, MD    06/24/2018 Cancer Staging   Staging form: Corpus Uteri - Carcinoma and Carcinosarcoma, AJCC 8th Edition - Clinical: Stage IVB (cT1a, cN2, pM1) - Signed by [name], MD on 06/24/2018    Genetic Testing   Patient has genetic testing done for MMR on pathology from 06/18/2018. Results revealed patient has the following mutation(s): MMR: abnormal   06/29/2018 Procedure   Placement of a subcutaneous port device. Catheter tip at the SVC and right atrium junction.    Genetic Testing   Patient has genetic testing done for MSI on pathology from 06/18/2018. Results revealed patient has the following mutation(s): MSI: High   07/02/2018 PET scan   Previous hysterectomy, with asymmetric focus of hypermetabolic activity in the left vaginal cuff, suspicious for residual or recurrent carcinoma.  Large hypermetabolic soft tissue mass  involving the left pelvic sidewall and acetabulum, consistent with metastatic disease.  No evidence metastatic disease within the abdomen, chest, or neck.   07/09/2018 Tumor Marker   Patient's tumor was tested for the following markers: CA-125 Results of the tumor marker test revealed 9   07/10/2018 - 08/24/2018 Chemotherapy   The patient had carboplatin and taxol x 3 cycles   07/17/2018 Genetic Testing   Negative genetic testing on the common hereditary cancer panel.  The Common Hereditary Gene Panel offered by [facility] includes sequencing and/or deletion duplication testing of the following 48 genes: APC, ATM, AXIN2, BARD1, BMPR1A, BRCA1, BRCA2, BRIP1, CDH1, CDK4, CDKN2A (p14ARF), CDKN2A (p16INK4a), CHEK2, CTNNA1, DICER1, EPCAM (Deletion/duplication testing only), GREM1 (promoter region deletion/duplication testing only), KIT, MEN1, MLH1, MSH2, MSH3, MSH6, MUTYH, NBN, NF1, NHTL1, PALB2, PDGFRA, PMS2, POLD1, POLE, PTEN, RAD50, RAD51C, RAD51D, RNF43, SDHB, SDHC, SDHD, SMAD4, SMARCA4. STK11, TP53, TSC1, TSC2, and VHL.  The following genes were evaluated for sequence changes only: SDHA and HOXB13 c.251G>A variant only. The report date is Jul 17, 2018.    10/03/2018 Imaging   CT abdomen and pelvis 1.  No acute intra-abdominal process. 2. Grossly unchanged left pelvic sidewall mass with osseous involvement of the medial acetabulum. Progressive mild displacement of the associated comminuted pathologic fracture involving the right acetabulum and puboacetabular junction.  3. New venous stents extending from the left common iliac vein origin to the proximal left common femoral vein. The stents are patent.   11/06/2018 -  Chemotherapy   The patient had pembrolizumab for chemotherapy treatment.     01/28/2019 Imaging   1. No substantial interval change in  exam. 2. Interval development of mild fullness in the left intrarenal collecting system and ureter without overt hydronephrosis at this time. 3.  Abnormal soft tissue along the left pelvic sidewall has decreased slightly in the interval. 4. Similar appearance of ill-defined fascial planes in the pelvis with some peritoneal thickening along the right pelvic sidewall and potentially involving the sigmoid mesocolon. 5. No substantial ascites.   05/03/2019 Imaging   1. Stable mild left pelvic sidewall soft tissue density. No new or progressive disease identified within the abdomen or pelvis.  2. Colonic diverticulosis. No radiographic evidence of diverticulitis.   Aortic Atherosclerosis (ICD10-I70.0).   09/09/2019 Imaging   1. No change in appearance of soft tissue thickening along the LEFT pelvic sidewall adjacent to chronic LEFT acetabular fracture. 2. Mild asymmetry of the bladder wall favoring the LEFT bladder wall, not well assessed. Similar accounting for variable degrees of distension on prior studies potentially related to prior radiation, attention on follow-up. 3. Signs of venous stenting in the LEFT hemipelvis with LEFT lower extremity muscular atrophy and mild stranding with similar appearance. Signs of colonic diverticulosis and diverticular disease without change.   Metastasis to lymph nodes ([place])  06/23/2018 Initial Diagnosis   Metastasis to lymph nodes ([place])   07/10/2018 - 08/24/2018 Chemotherapy   The patient had palonosetron (ALOXI) injection 0.25 mg, 0.25 mg, Intravenous,  Once, 3 of 6 cycles Administration: 0.25 mg (07/10/2018), 0.25 mg (07/31/2018), 0.25 mg (08/24/2018) CARBOplatin (PARAPLATIN) 480 mg in sodium chloride 0.9 % 250 mL chemo infusion, 480 mg (100 % of original dose 482.5 mg), Intravenous,  Once, 3 of 6 cycles Dose modification: 482.5 mg (original dose 482.5 mg, Cycle 1) Administration: 480 mg (07/10/2018), 480 mg (07/31/2018), 480 mg (08/24/2018) PACLitaxel (TAXOL) 276 mg in sodium chloride 0.9 % 250 mL chemo infusion (> 21m/m2), 140 mg/m2 = 276 mg (80 % of original dose 175 mg/m2), Intravenous,  Once, 3 of 6  cycles Dose modification: 140 mg/m2 (80 % of original dose 175 mg/m2, Cycle 1, Reason: Dose Not Tolerated) Administration: 276 mg (07/10/2018), 276 mg (07/31/2018), 276 mg (08/24/2018) fosaprepitant (EMEND) 150 mg, dexamethasone (DECADRON) 12 mg in sodium chloride 0.9 % 145 mL IVPB, , Intravenous,  Once, 3 of 6 cycles Administration:  (07/10/2018),  (07/31/2018),  (08/24/2018)  for chemotherapy treatment.    11/06/2018 -  Chemotherapy   The patient had pembrolizumab for chemotherapy treatment.     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  06/24/2018 Initial Diagnosis   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   07/10/2018 - 08/24/2018 Chemotherapy   The patient had palonosetron (ALOXI) injection 0.25 mg, 0.25 mg, Intravenous,  Once, 3 of 6 cycles Administration: 0.25 mg (07/10/2018), 0.25 mg (07/31/2018), 0.25 mg (08/24/2018) CARBOplatin (PARAPLATIN) 480 mg in sodium chloride 0.9 % 250 mL chemo infusion, 480 mg (100 % of original dose 482.5 mg), Intravenous,  Once, 3 of 6 cycles Dose modification: 482.5 mg (original dose 482.5 mg, Cycle 1) Administration: 480 mg (07/10/2018), 480 mg (07/31/2018), 480 mg (08/24/2018) PACLitaxel (TAXOL) 276 mg in sodium chloride 0.9 % 250 mL chemo infusion (> 855mm2), 140 mg/m2 = 276 mg (80 % of original dose 175 mg/m2), Intravenous,  Once, 3 of 6 cycles Dose modification: 140 mg/m2 (80 % of original dose 175 mg/m2, Cycle 1, Reason: Dose Not Tolerated) Administration: 276 mg (07/10/2018), 276 mg (07/31/2018), 276 mg (08/24/2018) fosaprepitant (EMEND) 150 mg, dexamethasone (DECADRON) 12 mg in sodium chloride 0.9 % 145 mL IVPB, , Intravenous,  Once, 3 of 6 cycles Administration:  (07/10/2018),  (  07/31/2018),  (08/24/2018)  for chemotherapy treatment.    11/06/2018 -  Chemotherapy   The patient had pembrolizumab for chemotherapy treatment.     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       REVIEW OF SYSTEMS:   Constitutional: Denies fevers, chills or abnormal weight loss Eyes: Denies blurriness of vision Ears, nose, mouth, throat, and face: Denies mucositis or sore throat Respiratory: Denies cough, dyspnea or wheezes Cardiovascular: Denies palpitation, chest discomfort or lower extremity swelling Gastrointestinal:  Denies nausea, heartburn or change in bowel habits Skin: Denies abnormal skin rashes Lymphatics: Denies new lymphadenopathy or easy bruising Neurological:Denies numbness, tingling or new weaknesses Behavioral/Psych: Mood is stable, no new changes  All other systems were reviewed with the patient and are negative.  I have reviewed the past medical history, past surgical history, social history and family history with the patient and they are unchanged from previous note.  ALLERGIES:  has No Known Allergies.  MEDICATIONS:  Current Outpatient Medications  Medication Sig Dispense Refill  . amoxicillin (AMOXIL) 500 MG tablet Take 1 tablet (500 mg total) by mouth 2 (two) times daily. 14 tablet 0  . apixaban (ELIQUIS) 2.5 MG TABS tablet Take by mouth 2 (two) times daily.    . clopidogrel (PLAVIX) 75 MG tablet Take 1 tablet (75 mg total) by mouth daily with breakfast. 30 tablet 1  . diclofenac sodium (VOLTAREN) 1 % GEL APPLY 4GRAMS 4 TIMES A DAY AS NEEDED FOR PAINS    . gabapentin (NEURONTIN) 300 MG capsule Take 300 mg by mouth 2 (two) times daily.    . methadone (DOLOPHINE) 10 MG tablet Take 1 tablet (10 mg total) by mouth every 12 (twelve) hours. 60 tablet 0  . morphine (MSIR) 15 MG tablet Take 1 tablet (15 mg total) by mouth every 6 (six) hours as needed for severe pain. 60 tablet 0  . Olopatadine HCl 0.2 % SOLN Place 1 drop into both eyes daily.     No current facility-administered medications for this visit.    PHYSICAL  EXAMINATION: ECOG PERFORMANCE STATUS: 1 - Symptomatic but completely ambulatory  Vitals:   02/04/20 1029  BP: (!) 154/67  Pulse: 98  Resp: 18  Temp: 98.6 F (37 C)  SpO2: 100%   Filed Weights   02/04/20 1029  Weight: 212 lb 9.6 oz (96.4 kg)    GENERAL:alert, no distress and comfortable Musculoskeletal:no cyanosis of digits and no clubbing  NEURO: alert & oriented x 3 with fluent speech, no focal motor/sensory deficits  LABORATORY DATA:  I have reviewed the data as listed    Component Value Date/Time   NA 139 01/11/2020 1358   K 3.7 01/11/2020 1358   CL 106 01/11/2020 1358   CO2 24 01/11/2020 1358   GLUCOSE 88 01/11/2020 1358   BUN 17 01/11/2020 1358   CREATININE 0.70 01/11/2020 1358   CREATININE 0.66 12/24/2019 1023   CALCIUM 9.3 01/11/2020 1358   PROT 7.6 01/11/2020 1358   ALBUMIN 3.8 01/11/2020 1358   AST 13 (L) 01/11/2020 1358   AST 11 (L) 12/24/2019 1023   ALT 8 01/11/2020 1358   ALT 7 12/24/2019 1023   ALKPHOS 137 (H) 01/11/2020 1358   BILITOT 0.6 01/11/2020 1358   BILITOT 0.6 12/24/2019 1023   GFRNONAA >60 01/11/2020 1358  GFRNONAA >60 12/24/2019 1023   GFRAA >60 11/12/2019 1222    No results found for: SPEP, UPEP  Lab Results  Component Value Date   WBC 3.5 (L) 02/04/2020   NEUTROABS 1.9 02/04/2020   HGB 11.9 (L) 02/04/2020   HCT 36.2 02/04/2020   MCV 91.6 02/04/2020   PLT 211 02/04/2020      Chemistry      Component Value Date/Time   NA 139 01/11/2020 1358   K 3.7 01/11/2020 1358   CL 106 01/11/2020 1358   CO2 24 01/11/2020 1358   BUN 17 01/11/2020 1358   CREATININE 0.70 01/11/2020 1358   CREATININE 0.66 12/24/2019 1023      Component Value Date/Time   CALCIUM 9.3 01/11/2020 1358   ALKPHOS 137 (H) 01/11/2020 1358   AST 13 (L) 01/11/2020 1358   AST 11 (L) 12/24/2019 1023   ALT 8 01/11/2020 1358   ALT 7 12/24/2019 1023   BILITOT 0.6 01/11/2020 1358   BILITOT 0.6 12/24/2019 1023

## 2020-02-24 ENCOUNTER — Ambulatory Visit (HOSPITAL_COMMUNITY)
Admission: RE | Admit: 2020-02-24 | Discharge: 2020-02-24 | Disposition: A | Discharge status: Home / Self Care | Payer: Medicare Other | Source: Ambulatory Visit | Attending: Hematology and Oncology | Admitting: Hematology and Oncology

## 2020-02-24 ENCOUNTER — Inpatient Hospital Stay: Payer: Medicare Other | Attending: Hematology and Oncology

## 2020-02-24 ENCOUNTER — Other Ambulatory Visit: Payer: Self-pay

## 2020-02-24 ENCOUNTER — Inpatient Hospital Stay: Payer: Medicare Other

## 2020-02-24 DIAGNOSIS — C541 Malignant neoplasm of endometrium: Secondary | ICD-10-CM | POA: Diagnosis not present

## 2020-02-24 DIAGNOSIS — G893 Neoplasm related pain (acute) (chronic): Secondary | ICD-10-CM | POA: Insufficient documentation

## 2020-02-24 DIAGNOSIS — Z7901 Long term (current) use of anticoagulants: Secondary | ICD-10-CM | POA: Diagnosis not present

## 2020-02-24 DIAGNOSIS — C7951 Secondary malignant neoplasm of bone: Secondary | ICD-10-CM | POA: Diagnosis not present

## 2020-02-24 DIAGNOSIS — C55 Malignant neoplasm of uterus, part unspecified: Secondary | ICD-10-CM

## 2020-02-24 DIAGNOSIS — R319 Hematuria, unspecified: Secondary | ICD-10-CM | POA: Insufficient documentation

## 2020-02-24 DIAGNOSIS — E039 Hypothyroidism, unspecified: Secondary | ICD-10-CM

## 2020-02-24 DIAGNOSIS — M2550 Pain in unspecified joint: Secondary | ICD-10-CM | POA: Diagnosis not present

## 2020-02-24 DIAGNOSIS — Z5112 Encounter for antineoplastic immunotherapy: Secondary | ICD-10-CM | POA: Diagnosis not present

## 2020-02-24 DIAGNOSIS — E538 Deficiency of other specified B group vitamins: Secondary | ICD-10-CM | POA: Diagnosis not present

## 2020-02-24 DIAGNOSIS — Z86718 Personal history of other venous thrombosis and embolism: Secondary | ICD-10-CM | POA: Insufficient documentation

## 2020-02-24 DIAGNOSIS — Z7902 Long term (current) use of antithrombotics/antiplatelets: Secondary | ICD-10-CM | POA: Insufficient documentation

## 2020-02-24 DIAGNOSIS — D509 Iron deficiency anemia, unspecified: Secondary | ICD-10-CM | POA: Insufficient documentation

## 2020-02-24 DIAGNOSIS — Z79899 Other long term (current) drug therapy: Secondary | ICD-10-CM | POA: Insufficient documentation

## 2020-02-24 DIAGNOSIS — K5730 Diverticulosis of large intestine without perforation or abscess without bleeding: Secondary | ICD-10-CM | POA: Diagnosis not present

## 2020-02-24 DIAGNOSIS — M1612 Unilateral primary osteoarthritis, left hip: Secondary | ICD-10-CM | POA: Insufficient documentation

## 2020-02-24 LAB — COMPREHENSIVE METABOLIC PANEL
ALT: 8 U/L (ref 0–44)
AST: 11 U/L — ABNORMAL LOW (ref 15–41)
Albumin: 3.7 g/dL (ref 3.5–5.0)
Alkaline Phosphatase: 142 U/L — ABNORMAL HIGH (ref 38–126)
Anion gap: 7 (ref 5–15)
BUN: 13 mg/dL (ref 8–23)
CO2: 24 mmol/L (ref 22–32)
Calcium: 9.2 mg/dL (ref 8.9–10.3)
Chloride: 106 mmol/L (ref 98–111)
Creatinine, Ser: 0.71 mg/dL (ref 0.44–1.00)
GFR, Estimated: >60 mL/min (ref >60)
Glucose, Bld: 100 mg/dL — ABNORMAL HIGH (ref 70–99)
Potassium: 3.9 mmol/L (ref 3.5–5.1)
Sodium: 137 mmol/L (ref 135–145)
Total Bilirubin: 0.7 mg/dL (ref 0.3–1.2)
Total Protein: 7.5 g/dL (ref 6.5–8.1)

## 2020-02-24 LAB — CBC WITH DIFFERENTIAL/PLATELET
Abs Immature Granulocytes: 0.01 10*3/uL (ref 0.00–0.07)
Basophils Absolute: 0 10*3/uL (ref 0.0–0.1)
Basophils Relative: 1 %
Eosinophils Absolute: 0.2 10*3/uL (ref 0.0–0.5)
Eosinophils Relative: 5 %
HCT: 35.4 % — ABNORMAL LOW (ref 36.0–46.0)
Hemoglobin: 11.8 g/dL — ABNORMAL LOW (ref 12.0–15.0)
Immature Granulocytes: 0 %
Lymphocytes Relative: 27 %
Lymphs Abs: 0.9 10*3/uL (ref 0.7–4.0)
MCH: 30.3 pg (ref 26.0–34.0)
MCHC: 33.3 g/dL (ref 30.0–36.0)
MCV: 90.8 fL (ref 80.0–100.0)
Monocytes Absolute: 0.4 10*3/uL (ref 0.1–1.0)
Monocytes Relative: 11 %
Neutro Abs: 1.8 10*3/uL (ref 1.7–7.7)
Neutrophils Relative %: 56 %
Platelets: 211 10*3/uL (ref 150–400)
RBC: 3.9 MIL/uL (ref 3.87–5.11)
RDW: 13 % (ref 11.5–15.5)
WBC: 3.3 10*3/uL — ABNORMAL LOW (ref 4.0–10.5)
nRBC: 0 % (ref 0.0–0.2)

## 2020-02-24 LAB — TSH: TSH: 2.451 u[IU]/mL (ref 0.308–3.960)

## 2020-02-24 MED ORDER — SODIUM CHLORIDE 0.9% FLUSH
10.0000 mL | Freq: Once | INTRAVENOUS | Status: AC
  Administered 2020-02-24: 10 mL
  Filled 2020-02-24: qty 10

## 2020-02-24 MED ORDER — IOHEXOL 300 MG/ML  SOLN
100.0000 mL | Freq: Once | INTRAMUSCULAR | Status: AC | PRN
  Administered 2020-02-24: 100 mL via INTRAVENOUS

## 2020-02-24 MED ORDER — HEPARIN SOD (PORK) LOCK FLUSH 100 UNIT/ML IV SOLN
500.0000 [IU] | Freq: Once | INTRAVENOUS | Status: AC
  Administered 2020-02-24: 500 [IU] via INTRAVENOUS

## 2020-02-24 MED ORDER — HEPARIN SOD (PORK) LOCK FLUSH 100 UNIT/ML IV SOLN
INTRAVENOUS | Status: AC
  Filled 2020-02-24: qty 5

## 2020-02-24 NOTE — Patient Instructions (Signed)

## 2020-02-25 ENCOUNTER — Inpatient Hospital Stay: Payer: Medicare Other

## 2020-02-25 ENCOUNTER — Encounter: Payer: Self-pay | Admitting: Hematology and Oncology

## 2020-02-25 ENCOUNTER — Inpatient Hospital Stay (HOSPITAL_BASED_OUTPATIENT_CLINIC_OR_DEPARTMENT_OTHER): Payer: Medicare Other | Admitting: Hematology and Oncology

## 2020-02-25 ENCOUNTER — Telehealth: Payer: Self-pay

## 2020-02-25 ENCOUNTER — Other Ambulatory Visit: Payer: Self-pay

## 2020-02-25 VITALS — BP 136/58 | HR 76 | Temp 97.6°F | Resp 18 | Ht 61.0 in | Wt 218.8 lb

## 2020-02-25 DIAGNOSIS — C55 Malignant neoplasm of uterus, part unspecified: Secondary | ICD-10-CM | POA: Diagnosis not present

## 2020-02-25 DIAGNOSIS — D539 Nutritional anemia, unspecified: Secondary | ICD-10-CM

## 2020-02-25 DIAGNOSIS — C541 Malignant neoplasm of endometrium: Secondary | ICD-10-CM | POA: Diagnosis not present

## 2020-02-25 DIAGNOSIS — I825Z2 Chronic embolism and thrombosis of unspecified deep veins of left distal lower extremity: Secondary | ICD-10-CM

## 2020-02-25 DIAGNOSIS — C7951 Secondary malignant neoplasm of bone: Secondary | ICD-10-CM | POA: Diagnosis not present

## 2020-02-25 DIAGNOSIS — M25552 Pain in left hip: Secondary | ICD-10-CM | POA: Insufficient documentation

## 2020-02-25 DIAGNOSIS — C774 Secondary and unspecified malignant neoplasm of inguinal and lower limb lymph nodes: Secondary | ICD-10-CM

## 2020-02-25 DIAGNOSIS — Z7189 Other specified counseling: Secondary | ICD-10-CM

## 2020-02-25 DIAGNOSIS — C801 Malignant (primary) neoplasm, unspecified: Secondary | ICD-10-CM

## 2020-02-25 MED ORDER — SODIUM CHLORIDE 0.9% FLUSH
10.0000 mL | INTRAVENOUS | Status: DC | PRN
  Administered 2020-02-25: 10 mL
  Filled 2020-02-25: qty 10

## 2020-02-25 MED ORDER — HEPARIN SOD (PORK) LOCK FLUSH 100 UNIT/ML IV SOLN
500.0000 [IU] | Freq: Once | INTRAVENOUS | Status: AC | PRN
  Administered 2020-02-25: 500 [IU]
  Filled 2020-02-25: qty 5

## 2020-02-25 MED ORDER — SODIUM CHLORIDE 0.9 % IV SOLN
200.0000 mg | Freq: Once | INTRAVENOUS | Status: AC
  Administered 2020-02-25: 200 mg via INTRAVENOUS
  Filled 2020-02-25: qty 8

## 2020-02-25 MED ORDER — SODIUM CHLORIDE 0.9 % IV SOLN
Freq: Once | INTRAVENOUS | Status: AC
  Filled 2020-02-25: qty 250

## 2020-02-25 NOTE — Assessment & Plan Note (Signed)
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

## 2020-02-25 NOTE — Assessment & Plan Note (Signed)
She is taking both anticoagulation therapy and antiplatelet agent She has no recent bleeding Her hemoglobin is normal I reassured the patient She will continue her medications as directed 

## 2020-02-25 NOTE — Telephone Encounter (Signed)
Faxed referral to Dr. Elsie Saas office at [phone], received confirmation.

## 2020-02-25 NOTE — Assessment & Plan Note (Signed)
She was found to have iron deficiency anemia and borderline vitamin B12 deficiency After receiving intravenous iron infusion, her blood counts are stable We will continue to monitor closely She is not symptomatic We will proceed without delay 

## 2020-02-25 NOTE — Progress Notes (Signed)
[place] OFFICE PROGRESS NOTE  Patient Care Team: Nolene Ebbs, MD as PCP - General (Internal Medicine)  ASSESSMENT & PLAN:  Uterine cancer [facility]) I have reviewed recent CT imaging with the patient She has no signs of active malignancy Due to high risk of recurrence, she will continue pembrolizumab indefinitely I plan to space out her imaging study, next would be due in July Given hip joint destruction on the side of radiation, I recommend referral to her orthopedic surgeon to discuss the possibility of hip replacement therapy  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 [facility]) She has chronic left hip pain The left side of her hip has received significant radiation Based on her recent imaging study, it appears that she has significant joint destruction and that is causing pain I recommend orthopedic review and consideration for possible hip replacement therapy In the meantime, we will continue pain medicine as prescribed  Deficiency anemia She was found to have iron deficiency anemia and borderline vitamin B12 deficiency After receiving intravenous iron infusion, her blood counts are stable We will continue to monitor closely She is not symptomatic We will proceed without delay  Lower leg DVT (deep venous thromboembolism), chronic, left ([place]) She is taking both anticoagulation therapy and antiplatelet agent She has no recent bleeding Her hemoglobin is normal I reassured the patient She will continue her medications as directed   Orders Placed This Encounter  Procedures  . AMB referral to orthopedics    Referral Priority:   Routine    Referral Type:   Consultation    Referred to Provider:   Elsie Saas, MD    Number of Visits Requested:   1    All questions were answered. The patient knows to call the clinic with any problems, questions or concerns. The total time spent in the appointment was 30 minutes encounter with patients including review of chart and various tests  results, discussions about plan of care and coordination of care plan   [name], MD 02/25/2020 1:20 PM  INTERVAL HISTORY: Please see below for problem oriented charting. 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 Uterus +/- tubes/ovaries, neoplastic, with left fallopian tube and ovary - INVASIVE ENDOMETRIOID CARCINOMA (1.5 CM), FIGO GRADE II, ARISING IN A BACKGROUND OF ATYPICAL COMPLEX HYPERPLASIA, CONFINED WITHIN INNER HALF OF THE MYOMETRIUM. - ENDOMETRIAL POLYP WITH ASSOCIATED ATYPICAL COMPLEX HYPERPLASIA. - MYOMETRIUM: LEIOMYOMATA. - CERVIX: BENIGN SQUAMOUS MUCOSA AND ENDOCERVICAL MUCOSA, NO DYSPLASIA OR MALIGNANCY. - LEFT OVARY: BENIGN OVARIAN TISSUE WITH ENDOSALPINGOSIS, NO EVIDENCE OF ATYPIA OR MALIGNANCY. - LEFT FALLOPIAN TUBE: NO HISTOLOGIC ABNORMALITIES. - PLEASE SEE ONCOLOGY TEMPLATE FOR DETAIL. 2. Ovary and fallopian tube, right - BENIGN OVARIAN TISSUE WITH ENDOSALPINGOSIS, NO ATYPIA OR MALIGNANCY. - BENIGN FALLOPIAN TUBAL TISSUE, NO PATHOLOGIC ABNORMALITIES. Microscopic Comment 1. UTERUS Specimen: Uterus, cervix, bilateral ovaries and fallopian tubes Procedure: Total hysterectomy and bilateral salpingo-oophorectomy Lymph node sampling performed: No Specimen integrity: Intact Maximum tumor size (cm): 1.5 cm, glass slide measurement Histologic type: Invasive endometrioid carcinoma Grade: FIGO grade II Myometrial invasion: 1 cm where myometrium is 2.3 cm in thickness Cervical stromal involvement: No Extent  of involvement of other organs: No Lymph vascular  invasion: Not identified Peritoneal washings: Negative ([id number]) Lymph nodes: number examined N/A; number positive N/A TNM code: pT1a, pNX 1 oFf 3IGO Stage (based on pathologic findings, needs clinical correlation): IA  Comments: Sections the endomyometrium away from the grossly identified endometrial polyp show an invasive FIGO grade II endometrioid carcinoma. The tumor is confined within inner half of the myometrium. No angiolymphatic invasion is identified. No cervical stromal involvement is identified. Sections of the grossly identified endometrial polyp show an endometrial polyp with associated atypical compacted hyperplasia with no definitive evidence of carcinoma.   12/07/2017 Imaging   US venous Doppler Right: No evidence of common femoral vein obstruction. Left: Findings consistent with acute deep vein thrombosis involving the left femoral vein, left proximal profunda vein, and left popliteal vein. Unable to adequately interrogate the common femoral and higher, or the calf secondary to significant edema and body habitus   12/07/2017 [facility] Admission   She presented to the ER and was diagnosed with acute DVT   01/18/2018 - 01/21/2018 Hospital Admission   She was admitted to the hospital for management of severe persistent DVT   01/18/2018 Imaging   US venous Doppler Right: No evidence of common femoral vein obstruction. Left: Findings consistent with acute deep vein thrombosis involving the left common femoral vein, and left popliteal vein.   01/19/2018 Surgery   Pre-operative Diagnosis: Subacute DVT with severe post thrombotic syndrome Post-operative diagnosis:  Same Surgeon:  Erlene Quan C. Donzetta Matters, MD Procedure Performed: 1.  Ultrasound-guided cannulation left small saphenous vein 2.  Left lower extremity and central venography 3.  Intravascular ultrasound of left popliteal, femoral, common femoral, external and common iliac veins and IVC 4.  Stent of left common and external  iliac veins with 14 x 60 mm Vici 5.  Moderate sedation with fentanyl and Versed for 50 minutes  Indications: 75 year old female with a history of DVT in October now presents with persistent left lower extremity swelling and ultrasound demonstrating likely persistent DVT.  She has been on Xarelto at this time.  She is now indicated for venogram possible intervention.  Findings: Flow in the left lower extremity was stagnant throughout but by venogram all veins were patent.  There was a focal occlusive area approximately 2 cm in length at the common and external iliac vein junction at the hypogastric on the left.  After stenting and ballooning we had a diameter of 12 millimeters in the stent and venogram demonstrated flow in the lower extremity veins were previously was stagnant and no further residual stenosis in the left common and external iliac vein junction.   04/12/2018 Imaging   US Venous Doppler Right: No evidence of common femoral vein obstruction. Left: There is no evidence of deep vein thrombosis in the lower extremity. However, portions of this examination were limited- see technologist comments above. Left groin: Large hypoechoic area with mixed echoes noted measuring nearly 10 cm. Possible  hematoma versus unknown etiology. Ultrasound characteristics of enlarged lymph nodes noted in the groin.      05/15/2018 Imaging   US Venous Doppler Right: No evidence of deep vein thrombosis in the lower extremity. No indirect evidence of obstruction proximal to the inguinal ligament. Left: No reflux was noted in the common femoral vein , femoral vein in the thigh, popliteal vein, great saphenous vein at the saphenofemoral junction, great saphenous vein at the proximal thigh, great saphenous vein at the mid thigh, great saphenous vein  at  the distal thigh, great saphenous vein at the knee, origin of the small saphenous vein, proximal small saphenous vein, and mid small saphenous vein. There is no  evidence of deep vein thrombosis in the lower extremity. There is no evidence of superficial venous thrombosis. No cystic structure found in the popliteal fossa. Unable to evaluate extension of common femoral vein obstruction proximal to the inguinal ligament.   06/01/2018 Imaging   1. 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 Additionally, there is lucency involving the medial sidewall of the left acetabulum with potential nondisplaced pathologic fracture. Further evaluation with contrast-enhanced pelvic MRI could be performed as clinically indicated. 2. The left pelvic arterial and venous system is encased by this infiltrative left pelvic sidewall mass however while difficult to ascertain, the left external iliac venous stent appears patent.   06/18/2018 Pathology Results   Lymph node for lymphoma, Left Inguinal - METASTATIC ADENOCARCINOMA, SEE COMMENT. Microscopic Comment Immunohistochemistry is positive for cytokeratin 7, PAX8, ER, and PR. Cytokeratin 5/6,and p63 are negative. The immunoprofile along with the patient's history are consistent with a gynecologic primary.   06/18/2018 Surgery   Pre-op Diagnosis: INGUINAL LYMPHADENOPATHY, PELVIC MASS     Procedure(s): EXCISIONAL BIOPSY DEEP LEFT INGUINAL LYMPH NODE  Surgeon(s): Coralie Keens, MD    06/24/2018 Cancer Staging   Staging form: Corpus Uteri - Carcinoma and Carcinosarcoma, AJCC 8th Edition - Clinical: Stage IVB (cT1a, cN2, pM1) - Signed by [name], MD on 06/24/2018    Genetic Testing   Patient has genetic testing done for MMR on pathology from 06/18/2018. Results revealed patient has the following mutation(s): MMR: abnormal   06/29/2018 Procedure   Placement of a subcutaneous port device. Catheter tip at the SVC and right atrium junction.    Genetic Testing   Patient has genetic testing done for MSI on pathology  from 06/18/2018. Results revealed patient has the following mutation(s): MSI: High   07/02/2018 PET scan   Previous hysterectomy, with asymmetric focus of hypermetabolic activity in the left vaginal cuff, suspicious for residual or recurrent carcinoma.  Large hypermetabolic soft tissue mass involving the left pelvic sidewall and acetabulum, consistent with metastatic disease.  No evidence metastatic disease within the abdomen, chest, or neck.   07/09/2018 Tumor Marker   Patient's tumor was tested for the following markers: CA-125 Results of the tumor marker test revealed 9   07/10/2018 - 08/24/2018 Chemotherapy   The patient had carboplatin and taxol x 3 cycles   07/17/2018 Genetic Testing   Negative genetic testing on the common hereditary cancer panel.  The Common Hereditary Gene Panel offered by [facility] includes sequencing and/or deletion duplication testing of the following 48 genes: APC, ATM, AXIN2, BARD1, BMPR1A, BRCA1, BRCA2, BRIP1, CDH1, CDK4, CDKN2A (p14ARF), CDKN2A (p16INK4a), CHEK2, CTNNA1, DICER1, EPCAM (Deletion/duplication testing only), GREM1 (promoter region deletion/duplication testing only), KIT, MEN1, MLH1, MSH2, MSH3, MSH6, MUTYH, NBN, NF1, NHTL1, PALB2, PDGFRA, PMS2, POLD1, POLE, PTEN, RAD50, RAD51C, RAD51D, RNF43, SDHB, SDHC, SDHD, SMAD4, SMARCA4. STK11, TP53, TSC1, TSC2, and VHL.  The following genes were evaluated for sequence changes only: SDHA and HOXB13 c.251G>A variant only. The report date is Jul 17, 2018.    10/03/2018 Imaging   CT abdomen and pelvis 1.  No acute intra-abdominal process. 2. Grossly unchanged left pelvic sidewall mass with osseous involvement of the medial acetabulum. Progressive mild displacement of the associated comminuted pathologic fracture involving the right acetabulum and puboacetabular junction.  3.  New venous stents extending from the left common iliac vein origin to the proximal left common femoral vein. The stents are patent.    11/06/2018 -  Chemotherapy   The patient had pembrolizumab for chemotherapy treatment.     01/28/2019 Imaging   1. No substantial interval change in exam. 2. Interval development of mild fullness in the left intrarenal collecting system and ureter without overt hydronephrosis at this time. 3. Abnormal soft tissue along the left pelvic sidewall has decreased slightly in the interval. 4. Similar appearance of ill-defined fascial planes in the pelvis with some peritoneal thickening along the right pelvic sidewall and potentially involving the sigmoid mesocolon. 5. No substantial ascites.   05/03/2019 Imaging   1. Stable mild left pelvic sidewall soft tissue density. No new or progressive disease identified within the abdomen or pelvis.  2. Colonic diverticulosis. No radiographic evidence of diverticulitis.   Aortic Atherosclerosis (ICD10-I70.0).   09/09/2019 Imaging   1. No change in appearance of soft tissue thickening along the LEFT pelvic sidewall adjacent to chronic LEFT acetabular fracture. 2. Mild asymmetry of the bladder wall favoring the LEFT bladder wall, not well assessed. Similar accounting for variable degrees of distension on prior studies potentially related to prior radiation, attention on follow-up. 3. Signs of venous stenting in the LEFT hemipelvis with LEFT lower extremity muscular atrophy and mild stranding with similar appearance. Signs of colonic diverticulosis and diverticular disease without change.   02/24/2020 Imaging   1. Unchanged appearance of the pelvis as detailed below. 2. Unchanged soft tissue thickening of the left pelvic sidewall. 3. 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 4. No evidence discrete mass or lymphadenopathy nor metastatic disease in the abdomen or pelvis. 5. Status post hysterectomy and cholecystectomy. 6. Left common iliac vein stent. 7. Pancolonic  diverticulosis.     Metastasis to lymph nodes ([place])  06/23/2018 Initial Diagnosis   Metastasis to lymph nodes ([place])   07/10/2018 - 08/24/2018 Chemotherapy   The patient had palonosetron (ALOXI) injection 0.25 mg, 0.25 mg, Intravenous,  Once, 3 of 6 cycles Administration: 0.25 mg (07/10/2018), 0.25 mg (07/31/2018), 0.25 mg (08/24/2018) CARBOplatin (PARAPLATIN) 480 mg in sodium chloride 0.9 % 250 mL chemo infusion, 480 mg (100 % of original dose 482.5 mg), Intravenous,  Once, 3 of 6 cycles Dose modification: 482.5 mg (original dose 482.5 mg, Cycle 1) Administration: 480 mg (07/10/2018), 480 mg (07/31/2018), 480 mg (08/24/2018) PACLitaxel (TAXOL) 276 mg in sodium chloride 0.9 % 250 mL chemo infusion (> 46m/m2), 140 mg/m2 = 276 mg (80 % of original dose 175 mg/m2), Intravenous,  Once, 3 of 6 cycles Dose modification: 140 mg/m2 (80 % of original dose 175 mg/m2, Cycle 1, Reason: Dose Not Tolerated) Administration: 276 mg (07/10/2018), 276 mg (07/31/2018), 276 mg (08/24/2018) fosaprepitant (EMEND) 150 mg, dexamethasone (DECADRON) 12 mg in sodium chloride 0.9 % 145 mL IVPB, , Intravenous,  Once, 3 of 6 cycles Administration:  (07/10/2018),  (07/31/2018),  (08/24/2018)  for chemotherapy treatment.    11/06/2018 -  Chemotherapy   The patient had pembrolizumab for chemotherapy treatment.     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 [place]  06/24/2018 Initial Diagnosis   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   07/10/2018 - 08/24/2018 Chemotherapy   The patient had palonosetron (ALOXI) injection 0.25 mg, 0.25 mg, Intravenous,  Once, 3 of 6 cycles Administration: 0.25 mg (07/10/2018), 0.25 mg (07/31/2018), 0.25 mg (08/24/2018) CARBOplatin (PARAPLATIN) 480 mg in  sodium chloride 0.9 % 250 mL chemo infusion, 480 mg (100 % of original dose 482.5 mg), Intravenous,  Once, 3 of 6 cycles Dose modification: 482.5 mg (original dose 482.5 mg, Cycle 1) Administration: 480 mg (07/10/2018), 480 mg (07/31/2018), 480 mg (08/24/2018) PACLitaxel (TAXOL) 276 mg in sodium chloride 0.9  % 250 mL chemo infusion (> 85m/m2), 140 mg/m2 = 276 mg (80 % of original dose 175 mg/m2), Intravenous,  Once, 3 of 6 cycles Dose modification: 140 mg/m2 (80 % of original dose 175 mg/m2, Cycle 1, Reason: Dose Not Tolerated) Administration: 276 mg (07/10/2018), 276 mg (07/31/2018), 276 mg (08/24/2018) fosaprepitant (EMEND) 150 mg, dexamethasone (DECADRON) 12 mg in sodium chloride 0.9 % 145 mL IVPB, , Intravenous,  Once, 3 of 6 cycles Administration:  (07/10/2018),  (07/31/2018),  (08/24/2018)  for chemotherapy treatment.    11/06/2018 -  Chemotherapy   The patient had pembrolizumab for chemotherapy treatment.     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       REVIEW OF SYSTEMS:   Constitutional: Denies fevers, chills or abnormal weight loss Eyes: Denies blurriness of vision Ears, nose, mouth, throat, and face: Denies mucositis or sore throat Respiratory: Denies cough, dyspnea or wheezes Cardiovascular: Denies palpitation, chest discomfort or lower extremity swelling Gastrointestinal:  Denies nausea, heartburn or change in bowel habits Skin: Denies abnormal skin rashes Lymphatics: Denies new lymphadenopathy or easy bruising Neurological:Denies numbness, tingling or new weaknesses Behavioral/Psych: Mood is stable, no new changes  All other systems were reviewed with the patient and are negative.  I have reviewed the past medical history, past surgical history, social history and family history with the patient and they are unchanged from previous note.  ALLERGIES:  has No Known Allergies.  MEDICATIONS:  Current Outpatient Medications  Medication Sig Dispense Refill  . amoxicillin (AMOXIL) 500 MG tablet Take 1 tablet (500 mg total) by mouth 2  (two) times daily. 14 tablet 0  . apixaban (ELIQUIS) 2.5 MG TABS tablet Take by mouth 2 (two) times daily.    . clopidogrel (PLAVIX) 75 MG tablet Take 1 tablet (75 mg total) by mouth daily with breakfast. 30 tablet 1  . diclofenac sodium (VOLTAREN) 1 % GEL APPLY 4GRAMS 4 TIMES A DAY AS NEEDED FOR PAINS    . gabapentin (NEURONTIN) 300 MG capsule Take 300 mg by mouth 2 (two) times daily.    . methadone (DOLOPHINE) 10 MG tablet Take 1 tablet (10 mg total) by mouth every 12 (twelve) hours. 60 tablet 0  . morphine (MSIR) 15 MG tablet Take 1 tablet (15 mg total) by mouth every 6 (six) hours as needed for severe pain. 60 tablet 0  . Olopatadine HCl 0.2 % SOLN Place 1 drop into both eyes daily.     No current facility-administered medications for this visit.   Facility-Administered Medications Ordered in Other Visits  Medication Dose Route Frequency Provider Last Rate Last Admin  . heparin lock flush 100 unit/mL  500 Units Intracatheter Once PRN GAlvy Bimler Ashyr Hedgepath, MD      . pembrolizumab (KEYTRUDA) 200 mg in sodium chloride 0.9 % 50 mL chemo infusion  200 mg Intravenous Once Kamrynn Melott, MD      . sodium chloride flush (NS) 0.9 % injection 10 mL  10 mL Intracatheter PRN GHeath [name] MD  PHYSICAL EXAMINATION: ECOG PERFORMANCE STATUS: 2 - Symptomatic, <50% confined to bed  Vitals:   02/25/20 1159  BP: (!) 136/58  Pulse: 76  Resp: 18  Temp: 97.6 F (36.4 C)  SpO2: 100%   Filed Weights   02/25/20 1159  Weight: 218 lb 12.8 oz (99.2 kg)    GENERAL:alert, no distress and comfortable SKIN: skin color, texture, turgor are normal, no rashes or significant lesions EYES: normal, Conjunctiva are pink and non-injected, sclera clear OROPHARYNX:no exudate, no erythema and lips, buccal mucosa, and tongue normal  NECK: supple, thyroid normal size, non-tender, without nodularity LYMPH:  no palpable lymphadenopathy in the cervical, axillary or inguinal LUNGS: clear to auscultation and percussion with  normal breathing effort HEART: regular rate & rhythm and no murmurs and no lower extremity edema ABDOMEN:abdomen soft, non-tender and normal bowel sounds Musculoskeletal:no cyanosis of digits and no clubbing  NEURO: alert & oriented x 3 with fluent speech, no focal motor/sensory deficits  LABORATORY DATA:  I have reviewed the data as listed    Component Value Date/Time   NA 137 02/24/2020 0947   K 3.9 02/24/2020 0947   CL 106 02/24/2020 0947   CO2 24 02/24/2020 0947   GLUCOSE 100 (H) 02/24/2020 0947   BUN 13 02/24/2020 0947   CREATININE 0.71 02/24/2020 0947   CREATININE 0.66 12/24/2019 1023   CALCIUM 9.2 02/24/2020 0947   PROT 7.5 02/24/2020 0947   ALBUMIN 3.7 02/24/2020 0947   AST 11 (L) 02/24/2020 0947   AST 11 (L) 12/24/2019 1023   ALT 8 02/24/2020 0947   ALT 7 12/24/2019 1023   ALKPHOS 142 (H) 02/24/2020 0947   BILITOT 0.7 02/24/2020 0947   BILITOT 0.6 12/24/2019 1023   GFRNONAA >60 02/24/2020 0947   GFRNONAA >60 12/24/2019 1023   GFRAA >60 11/12/2019 1222    No results found for: SPEP, UPEP  Lab Results  Component Value Date   WBC 3.3 (L) 02/24/2020   NEUTROABS 1.8 02/24/2020   HGB 11.8 (L) 02/24/2020   HCT 35.4 (L) 02/24/2020   MCV 90.8 02/24/2020   PLT 211 02/24/2020      Chemistry      Component Value Date/Time   NA 137 02/24/2020 0947   K 3.9 02/24/2020 0947   CL 106 02/24/2020 0947   CO2 24 02/24/2020 0947   BUN 13 02/24/2020 0947   CREATININE 0.71 02/24/2020 0947   CREATININE 0.66 12/24/2019 1023      Component Value Date/Time   CALCIUM 9.2 02/24/2020 0947   ALKPHOS 142 (H) 02/24/2020 0947   AST 11 (L) 02/24/2020 0947   AST 11 (L) 12/24/2019 1023   ALT 8 02/24/2020 0947   ALT 7 12/24/2019 1023   BILITOT 0.7 02/24/2020 0947   BILITOT 0.6 12/24/2019 1023       RADIOGRAPHIC STUDIES: I have reviewed imaging studies with the patient I have personally reviewed the radiological images as listed and agreed with the findings in the report. CT  ABDOMEN PELVIS W CONTRAST  Result Date: 02/24/2020 CLINICAL DATA:  Follow-up uterine cancer, status post chemotherapy and radiation EXAM: CT ABDOMEN AND PELVIS WITH CONTRAST TECHNIQUE: Multidetector CT imaging of the abdomen and pelvis was performed using the standard protocol following bolus administration of intravenous contrast. CONTRAST:  173m OMNIPAQUE IOHEXOL 300 MG/ML SOLN, additional oral enteric contrast COMPARISON:  09/09/2019 FINDINGS: Lower chest: No acute abnormality. Hepatobiliary: No focal liver abnormality is seen. Status post cholecystectomy. No biliary dilatation. Pancreas: Unremarkable. No pancreatic ductal dilatation or surrounding  inflammatory changes. Spleen: Normal in size without significant abnormality. Adrenals/Urinary Tract: Adrenal glands are unremarkable. Kidneys are normal, without renal calculi, solid lesion, or hydronephrosis. Bladder is unremarkable. Stomach/Bowel: Stomach is within normal limits. Appendix appears normal. No evidence of bowel wall thickening, distention, or inflammatory changes. Pancolonic diverticulosis. Vascular/Lymphatic: Left common iliac vein stent. No enlarged abdominal or pelvic lymph nodes. Reproductive: Status post hysterectomy. Other: No abdominal wall hernia or abnormality. No abdominopelvic ascites. Musculoskeletal: No acute osseous findings. Severe, destructive arthrosis of the left hip joint with bony erosion of the acetabulum and superior aspect of the femoral head and neck (series 5, image 79). Unchanged overlying soft tissue thickening of the left pelvic sidewall (series 2, image 52). IMPRESSION: 1. Unchanged appearance of the pelvis as detailed below. 2. Unchanged soft tissue thickening of the left pelvic sidewall. 3. 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 4. No evidence discrete mass or lymphadenopathy nor metastatic disease in the abdomen or  pelvis. 5. Status post hysterectomy and cholecystectomy. 6. Left common iliac vein stent. 7. Pancolonic diverticulosis. Electronically Signed   By: Eddie Candle M.D.   On: 02/24/2020 14:42

## 2020-02-25 NOTE — Assessment & Plan Note (Signed)
She has chronic left hip pain The left side of her hip has received significant radiation Based on her recent imaging study, it appears that she has significant joint destruction and that is causing pain I recommend orthopedic review and consideration for possible hip replacement therapy In the meantime, we will continue pain medicine as prescribed

## 2020-02-25 NOTE — Patient Instructions (Signed)
[facility] Discharge Instructions for Patients Receiving Chemotherapy  Today you received the following chemotherapy agents: pembrolizumab.  To help prevent nausea and vomiting after your treatment, we encourage you to take your nausea medication as directed.   If you develop nausea and vomiting that is not controlled by your nausea medication, call the clinic.   BELOW ARE SYMPTOMS THAT SHOULD BE REPORTED IMMEDIATELY:  *FEVER GREATER THAN 100.5 F  *CHILLS WITH OR WITHOUT FEVER  NAUSEA AND VOMITING THAT IS NOT CONTROLLED WITH YOUR NAUSEA MEDICATION  *UNUSUAL SHORTNESS OF BREATH  *UNUSUAL BRUISING OR BLEEDING  TENDERNESS IN MOUTH AND THROAT WITH OR WITHOUT PRESENCE OF ULCERS  *URINARY PROBLEMS  *BOWEL PROBLEMS  UNUSUAL RASH Items with * indicate a potential emergency and should be followed up as soon as possible.  Feel free to call the clinic should you have any questions or concerns. The clinic phone number is [phone].  Please show the CHEMO ALERT CARD at check-in to the Emergency Department and triage nurse.   

## 2020-02-25 NOTE — Telephone Encounter (Signed)
-----   Message from [name], MD sent at 02/25/2020  1:21 PM EST ----- Regarding: referral to Dr. Noemi Chapel orthopedics I finished my note Please complete referral

## 2020-03-01 ENCOUNTER — Other Ambulatory Visit: Payer: Self-pay | Admitting: *Deleted

## 2020-03-01 DIAGNOSIS — I872 Venous insufficiency (chronic) (peripheral): Secondary | ICD-10-CM

## 2020-03-01 DIAGNOSIS — I87002 Postthrombotic syndrome without complications of left lower extremity: Secondary | ICD-10-CM

## 2020-03-01 DIAGNOSIS — I82422 Acute embolism and thrombosis of left iliac vein: Secondary | ICD-10-CM

## 2020-03-13 ENCOUNTER — Other Ambulatory Visit: Payer: Self-pay

## 2020-03-13 ENCOUNTER — Ambulatory Visit (INDEPENDENT_AMBULATORY_CARE_PROVIDER_SITE_OTHER): Payer: Medicare Other | Admitting: Physician Assistant

## 2020-03-13 ENCOUNTER — Ambulatory Visit (HOSPITAL_COMMUNITY)
Admission: RE | Admit: 2020-03-13 | Discharge: 2020-03-13 | Disposition: A | Discharge status: Home / Self Care | Payer: Medicare Other | Source: Ambulatory Visit | Attending: Surgery | Admitting: Surgery

## 2020-03-13 VITALS — BP 118/70 | HR 69 | Temp 98.7°F | Resp 20 | Ht 61.0 in | Wt 206.3 lb

## 2020-03-13 DIAGNOSIS — I87002 Postthrombotic syndrome without complications of left lower extremity: Secondary | ICD-10-CM

## 2020-03-13 DIAGNOSIS — I82422 Acute embolism and thrombosis of left iliac vein: Secondary | ICD-10-CM | POA: Diagnosis present

## 2020-03-13 DIAGNOSIS — I872 Venous insufficiency (chronic) (peripheral): Secondary | ICD-10-CM

## 2020-03-13 NOTE — Progress Notes (Signed)
[facility] [place]         History of Present Illness  [name] is a 75 y.o. female with a  History of left common/ext iliac stenting, occlusion followed by thrombectomy and repeat stenting.   She was seen by Dr. Donzetta Matters a year ago.  She was still undergoing chemotherapy and  taking  Eliquis daily.  She states she was taken off her Plavix secondary to nose bleeds.   She is ambulating with a rolling walker.  She wears compression most days.  She denise current open wounds and No abnormal edema.  He main issue today is hip pain and is planning future hip replacement.     Past Medical History:  Diagnosis Date  . Acute upper respiratory infection 07/06/2014  . Anemia   . Arthritis    Back   . Colon polyp    Tubular Adenoma   . Cough productive of clear sputum 06/22/2014  . Family history of breast cancer   . GERD (gastroesophageal reflux disease)   . History of right bundle branch block (RBBB)   . HOH (hard of hearing)   . Hypertension    had in the past, is no longer on medication for this and blood pressures are WNL  . 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 Primary localized osteoarthritis of right knee   . Uterine cancer ([place]) 06/23/2018    Past Surgical History:  Procedure Laterality Date  . ABDOMINAL HYSTERECTOMY  2012  . CHOLECYSTECTOMY N/A 03/09/2013   Procedure: LAPAROSCOPIC CHOLECYSTECTOMY;  Surgeon: Gayland Curry, MD;  Location: [place];  Service: General;  Laterality: N/A;  . COLONOSCOPY W/ BIOPSIES    . IR IMAGING GUIDED PORT INSERTION  06/29/2018  . LARYNGOSCOPY Left 03/14/2017   Procedure: LARYNGOSCOPY;  Surgeon: Helayne Seminole, MD;  Location: [place];  Service: ENT;  Laterality: Left;  . LOWER EXTREMITY VENOGRAPHY Left 01/19/2018   Procedure: LOWER EXTREMITY VENOGRAPHY;  Surgeon: Waynetta Sandy, MD;  Location: [place];  Service: Cardiovascular;  Laterality: Left;  . LOWER EXTREMITY VENOGRAPHY N/A 09/08/2018   Procedure: LOWER  EXTREMITY VENOGRAPHY;  Surgeon: Waynetta Sandy, MD;  Location: [facility];  Service: Cardiovascular;  Laterality: N/A;  . LYMPH NODE BIOPSY Left 06/18/2018   Procedure: EXCISIONAL BIOPSY LEFT INGUINAL LYMPH NODE;  Surgeon: Coralie Keens, MD;  Location: [place];  Service: General;  Laterality: Left;  . PERIPHERAL VASCULAR INTERVENTION Left 01/19/2018   Procedure: PERIPHERAL VASCULAR INTERVENTION;  Surgeon: Waynetta Sandy, MD;  Location: [place] [facility];  Service: Cardiovascular;  Laterality: Left;  LEFT ILIAC VENOUS  . PERIPHERAL VASCULAR INTERVENTION Left 09/08/2018   Procedure: PERIPHERAL VASCULAR INTERVENTION;  Surgeon: Waynetta Sandy, MD;  Location: [facility];  Service: Cardiovascular;  Laterality: Left;  lower extremity  . TOTAL KNEE ARTHROPLASTY  04/29/2011   Procedure: TOTAL KNEE ARTHROPLASTY;  Surgeon: Lorn Junes, MD;  Location: [place];  Service: Orthopedics;  Laterality: Left;  DR [place] THIS CASE  . TOTAL KNEE ARTHROPLASTY Right 07/04/2014   Procedure: TOTAL KNEE ARTHROPLASTY;  Surgeon: Elsie Saas, MD;  Location: [place];  Service: Orthopedics;  Laterality: Right;    Social History   Socioeconomic History  . Marital status: Divorced    Spouse name: Not on file  . Number of children: 1  . Years of education: Not on file  . Highest education level: Not on file  Occupational History  .  Occupation: Retired   Tobacco Use  . Smoking status: Former Smoker    Years: 1.00    Quit date: 04/22/1988    Years since quitting: 31.9  . Smokeless tobacco: Never Used  Vaping Use  . Vaping Use: Never used  Substance and Sexual Activity  . Alcohol use: No  . Drug use: No  . Sexual activity: Yes    Birth control/protection: Surgical  Other Topics Concern  . Not on file  Social History Narrative   Daily caffeine    Social Determinants of Health   Financial Resource Strain: Not on file  Food Insecurity: Not on  file  Transportation Needs: Not on file  Physical Activity: Not on file  Stress: Not on file  Social Connections: Not on file  Intimate Partner Violence: Not on file    Family History  Problem Relation Age of Onset  . Arthritis Mother   . Hypertension Mother   . Alzheimer's disease Father   . Diabetes Sister   . Hypertension Sister   . Hypertension Brother   . Stroke Brother   . Hypertension Brother   . Hypertension Sister   . Hypertension Sister   . Hypertension Sister   . Breast cancer Other        Niece  . Breast cancer Niece 7       sister's daughter  . Anesthesia problems Neg Hx   . Hypotension Neg Hx   . Malignant hyperthermia Neg Hx   . Pseudochol deficiency Neg Hx   . Colon cancer Neg Hx     Current Outpatient Medications on File Prior to Visit  Medication Sig Dispense Refill  . apixaban (ELIQUIS) 2.5 MG TABS tablet Take by mouth 2 (two) times daily.    . diclofenac sodium (VOLTAREN) 1 % GEL APPLY 4GRAMS 4 TIMES A DAY AS NEEDED FOR PAINS    . gabapentin (NEURONTIN) 300 MG capsule Take 300 mg by mouth 2 (two) times daily.    . methadone (DOLOPHINE) 10 MG tablet Take 1 tablet (10 mg total) by mouth every 12 (twelve) hours. 60 tablet 0  . morphine (MSIR) 30 MG tablet Take by mouth.    . Olopatadine HCl 0.2 % SOLN Place 1 drop into both eyes daily.    Marland Kitchen amoxicillin (AMOXIL) 500 MG tablet Take 1 tablet (500 mg total) by mouth 2 (two) times daily. (Patient not taking: Reported on 03/13/2020) 14 tablet 0  . clopidogrel (PLAVIX) 75 MG tablet Take 1 tablet (75 mg total) by mouth daily with breakfast. (Patient not taking: Reported on 03/13/2020) 30 tablet 1   No current facility-administered medications on file prior to visit.    Allergies as of 03/13/2020  . (No Known Allergies)     ROS:   General:  No weight loss, Fever, chills  HEENT: No recent headaches, no nasal bleeding, no visual changes, no sore throat  Neurologic: No dizziness, blackouts, seizures. No  recent symptoms of stroke or mini- stroke. No recent episodes of slurred speech, or temporary blindness.  Cardiac: No recent episodes of chest pain/pressure, no shortness of breath at rest.  No shortness of breath with exertion.  Denies history of atrial fibrillation or irregular heartbeat  Vascular: No history of rest pain in feet.  No history of claudication.  No history of non-healing ulcer, No history of DVT   Pulmonary: No home oxygen, no productive cough, no hemoptysis,  No asthma or wheezing  Musculoskeletal:  [ ]  Arthritis, [ ]  Low back pain,  [  x ] Joint pain  Hematologic:No history of hypercoagulable state.  No history of easy bleeding.  positive history of anemia  Gastrointestinal: No hematochezia or melena,  No gastroesophageal reflux, no trouble swallowing  Urinary: [ ]  chronic Kidney disease, [ ]  on HD - [ ]  MWF or [ ]  TTHS, [ ]  Burning with urination, [ ]  Frequent urination, [ ]  Difficulty urinating;   Skin: No rashes  Psychological: No history of anxiety,  No history of depression  Physical Examination  Vitals:   03/13/20 0859  BP: 118/70  Pulse: 69  Resp: 20  Temp: 98.7 F (37.1 C)  TempSrc: Temporal  SpO2: 98%  Weight: 206 lb 4.8 oz (93.6 kg)  Height: 5\' 1"  (1.549 m)    Body mass index is 38.98 kg/m.  General:  Alert and oriented, no acute distress HEENT: Normal Neck: No bruit or JVD Pulmonary: Clear to auscultation bilaterally Cardiac: Regular Rate and Rhythm without murmur Abdomen: Soft, non-tender, non-distended, no mass, no scars Skin: No rash, B lower leg thickened, dark discolored skin from chronic venous insufficiency.   Extremity Pulses:  2+ radial, brachial, dorsalis pedis pulses bilaterally Musculoskeletal:Well healed B knee incisions, B LE minimal edema   Neurologic: Upper and lower extremity motor intact and symmetric  DATA:    IVC/Iliac Findings:  +----------+------+--------+--------+    IVC  PatentThrombusComments   +----------+------+--------+--------+  IVC Mid  patent          +----------+------+--------+--------+  IVC Distalpatent          +----------+------+--------+--------+     +-------------------+---------+-----------+---------+-----------+--------+      CIV    RT-PatentRT-ThrombusLT-PatentLT-ThrombusComments  +-------------------+---------+-----------+---------+-----------+--------+  Common Iliac Prox  patent        patent             +-------------------+---------+-----------+---------+-----------+--------+  Common Iliac Distal patent        patent             +-------------------+---------+-----------+---------+-----------+--------+   Patent left common iliac vein stent.   +-------------------------+---------+-----------+---------+-----------+----  ----+        EIV       RT-PatentRT-ThrombusLT-PatentLT-ThrombusComments  +-------------------------+---------+-----------+---------+-----------+----  ----+  External Iliac Vein Prox  patent        patent              +-------------------------+---------+-----------+---------+-----------+----  ----+  External Iliac Vein    patent        patent              Distal                                       +-------------------------+---------+-----------+---------+-----------+----  ----+     Patent external iliac vein stent.     Summary:  IVC/Iliac: No evidence of thrombus in IVC and Iliac veins.    Assessment/Plan: Patent B IVC/iliac veins Chronic venous insufficiency s/p thrombectomy and stenting. Continue to wear compression daily, avoid prolonged sitting in a dependent position and standing for prolonged periods.   Continue Eliquis daily.  F/U for repeat IVC duplex in 1 year.      Roxy Horseman PA-C [facility] [place] Office: [phone]  MD in office [place]

## 2020-03-17 ENCOUNTER — Encounter: Payer: Self-pay | Admitting: Hematology and Oncology

## 2020-03-17 ENCOUNTER — Inpatient Hospital Stay: Payer: Medicare Other

## 2020-03-17 ENCOUNTER — Other Ambulatory Visit: Payer: Self-pay

## 2020-03-17 ENCOUNTER — Inpatient Hospital Stay (HOSPITAL_BASED_OUTPATIENT_CLINIC_OR_DEPARTMENT_OTHER): Payer: Medicare Other | Admitting: Hematology and Oncology

## 2020-03-17 ENCOUNTER — Telehealth: Payer: Self-pay

## 2020-03-17 ENCOUNTER — Other Ambulatory Visit: Payer: Self-pay | Admitting: Hematology and Oncology

## 2020-03-17 VITALS — BP 135/82 | HR 102 | Temp 98.1°F | Resp 18 | Wt 208.6 lb

## 2020-03-17 DIAGNOSIS — C55 Malignant neoplasm of uterus, part unspecified: Secondary | ICD-10-CM

## 2020-03-17 DIAGNOSIS — C7951 Secondary malignant neoplasm of bone: Secondary | ICD-10-CM

## 2020-03-17 DIAGNOSIS — Z7189 Other specified counseling: Secondary | ICD-10-CM

## 2020-03-17 DIAGNOSIS — R319 Hematuria, unspecified: Secondary | ICD-10-CM

## 2020-03-17 DIAGNOSIS — C541 Malignant neoplasm of endometrium: Secondary | ICD-10-CM | POA: Diagnosis not present

## 2020-03-17 DIAGNOSIS — E039 Hypothyroidism, unspecified: Secondary | ICD-10-CM

## 2020-03-17 DIAGNOSIS — G893 Neoplasm related pain (acute) (chronic): Secondary | ICD-10-CM

## 2020-03-17 DIAGNOSIS — C801 Malignant (primary) neoplasm, unspecified: Secondary | ICD-10-CM

## 2020-03-17 DIAGNOSIS — C774 Secondary and unspecified malignant neoplasm of inguinal and lower limb lymph nodes: Secondary | ICD-10-CM

## 2020-03-17 LAB — CBC WITH DIFFERENTIAL/PLATELET
Abs Immature Granulocytes: 0.01 10*3/uL (ref 0.00–0.07)
Basophils Absolute: 0 10*3/uL (ref 0.0–0.1)
Basophils Relative: 1 %
Eosinophils Absolute: 0.1 10*3/uL (ref 0.0–0.5)
Eosinophils Relative: 4 %
HCT: 37.1 % (ref 36.0–46.0)
Hemoglobin: 12 g/dL (ref 12.0–15.0)
Immature Granulocytes: 0 %
Lymphocytes Relative: 22 %
Lymphs Abs: 0.8 10*3/uL (ref 0.7–4.0)
MCH: 29.4 pg (ref 26.0–34.0)
MCHC: 32.3 g/dL (ref 30.0–36.0)
MCV: 90.9 fL (ref 80.0–100.0)
Monocytes Absolute: 0.4 10*3/uL (ref 0.1–1.0)
Monocytes Relative: 11 %
Neutro Abs: 2.3 10*3/uL (ref 1.7–7.7)
Neutrophils Relative %: 62 %
Platelets: 215 10*3/uL (ref 150–400)
RBC: 4.08 MIL/uL (ref 3.87–5.11)
RDW: 12.5 % (ref 11.5–15.5)
WBC: 3.6 10*3/uL — ABNORMAL LOW (ref 4.0–10.5)
nRBC: 0 % (ref 0.0–0.2)

## 2020-03-17 LAB — URINALYSIS, COMPLETE (UACMP) WITH MICROSCOPIC
Bilirubin Urine: NEGATIVE
Glucose, UA: NEGATIVE mg/dL
Ketones, ur: NEGATIVE mg/dL
Nitrite: NEGATIVE
Protein, ur: NEGATIVE mg/dL
Specific Gravity, Urine: 1.015 (ref 1.005–1.030)
WBC, UA: >50 WBC/hpf — ABNORMAL HIGH (ref 0–5)
pH: 5 (ref 5.0–8.0)

## 2020-03-17 LAB — COMPREHENSIVE METABOLIC PANEL
ALT: 7 U/L (ref 0–44)
AST: 9 U/L — ABNORMAL LOW (ref 15–41)
Albumin: 3.8 g/dL (ref 3.5–5.0)
Alkaline Phosphatase: 137 U/L — ABNORMAL HIGH (ref 38–126)
Anion gap: 7 (ref 5–15)
BUN: 15 mg/dL (ref 8–23)
CO2: 25 mmol/L (ref 22–32)
Calcium: 9.1 mg/dL (ref 8.9–10.3)
Chloride: 107 mmol/L (ref 98–111)
Creatinine, Ser: 0.71 mg/dL (ref 0.44–1.00)
GFR, Estimated: >60 mL/min (ref >60)
Glucose, Bld: 100 mg/dL — ABNORMAL HIGH (ref 70–99)
Potassium: 3.6 mmol/L (ref 3.5–5.1)
Sodium: 139 mmol/L (ref 135–145)
Total Bilirubin: 0.6 mg/dL (ref 0.3–1.2)
Total Protein: 7.4 g/dL (ref 6.5–8.1)

## 2020-03-17 LAB — TSH: TSH: 1.624 u[IU]/mL (ref 0.308–3.960)

## 2020-03-17 MED ORDER — SODIUM CHLORIDE 0.9 % IV SOLN
200.0000 mg | Freq: Once | INTRAVENOUS | Status: AC
  Administered 2020-03-17: 200 mg via INTRAVENOUS
  Filled 2020-03-17: qty 8

## 2020-03-17 MED ORDER — SODIUM CHLORIDE 0.9% FLUSH
10.0000 mL | INTRAVENOUS | Status: DC | PRN
  Administered 2020-03-17: 10 mL
  Filled 2020-03-17: qty 10

## 2020-03-17 MED ORDER — SODIUM CHLORIDE 0.9 % IV SOLN
Freq: Once | INTRAVENOUS | Status: AC
  Filled 2020-03-17: qty 250

## 2020-03-17 MED ORDER — AMOXICILLIN 500 MG PO TABS: 500.0000 mg | ORAL_TABLET | Freq: Two times a day (BID) | ORAL | 0 refills | Status: DC

## 2020-03-17 MED ORDER — HEPARIN SOD (PORK) LOCK FLUSH 100 UNIT/ML IV SOLN
500.0000 [IU] | Freq: Once | INTRAVENOUS | Status: AC | PRN
  Administered 2020-03-17: 500 [IU]
  Filled 2020-03-17: qty 5

## 2020-03-17 MED ORDER — SODIUM CHLORIDE 0.9% FLUSH
10.0000 mL | Freq: Once | INTRAVENOUS | Status: AC
  Administered 2020-03-17: 10 mL
  Filled 2020-03-17: qty 10

## 2020-03-17 NOTE — Patient Instructions (Signed)

## 2020-03-17 NOTE — Progress Notes (Signed)
[place] OFFICE PROGRESS NOTE  Patient Care Team: Nolene Ebbs, MD as PCP - General (Internal Medicine)  ASSESSMENT & PLAN:  Uterine cancer [facility]) Per previous discussion, the plan would be to continue pembrolizumab indefinitely I do not plan to repeat imaging study again for few months  Cancer associated pain She has reasonable pain control She will continue current prescribed morphine sulfate and methadone as needed We discussed narcotic refill policy  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 [facility]) She has chronic left hip pain The left side of her hip has received significant radiation Based on her recent imaging study, it appears that she has significant joint destruction and that is causing pain I recommend orthopedic review and consideration for possible hip replacement therapy The consult was sent 3 weeks ago, will follow  Hematuria She has less hematuria since discontinuation of Plavix I will order urinalysis and urine culture to rule out recurrent urinary tract infection   Orders Placed This Encounter  Procedures  . Urine Culture    Standing Status:   Future    Number of Occurrences:   1    Standing Expiration Date:   03/17/2021  . Urinalysis, Complete w Microscopic    Standing Status:   Future    Number of Occurrences:   1    Standing Expiration Date:   03/17/2021    All questions were answered. The patient knows to call the clinic with any problems, questions or concerns. The total time spent in the appointment was 25 minutes encounter with patients including review of chart and various tests results, discussions about plan of care and coordination of care plan   [name], MD 03/17/2020 10:42 AM  INTERVAL HISTORY: Please see below for problem oriented charting. 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 Uterus +/- tubes/ovaries, neoplastic, with left fallopian tube and ovary - INVASIVE ENDOMETRIOID CARCINOMA (1.5 CM), FIGO GRADE II, ARISING IN A BACKGROUND OF ATYPICAL COMPLEX HYPERPLASIA, CONFINED WITHIN INNER HALF OF THE MYOMETRIUM. - ENDOMETRIAL POLYP WITH ASSOCIATED ATYPICAL COMPLEX HYPERPLASIA. - MYOMETRIUM: LEIOMYOMATA. - CERVIX: BENIGN SQUAMOUS MUCOSA AND ENDOCERVICAL MUCOSA, NO DYSPLASIA OR MALIGNANCY. - LEFT OVARY: BENIGN OVARIAN TISSUE WITH ENDOSALPINGOSIS, NO EVIDENCE OF ATYPIA OR MALIGNANCY. - LEFT FALLOPIAN TUBE: NO HISTOLOGIC ABNORMALITIES. - PLEASE SEE ONCOLOGY TEMPLATE FOR DETAIL. 2. Ovary and fallopian tube, right - BENIGN OVARIAN TISSUE WITH ENDOSALPINGOSIS, NO ATYPIA OR MALIGNANCY. - BENIGN FALLOPIAN TUBAL TISSUE, NO PATHOLOGIC ABNORMALITIES. Microscopic Comment 1. UTERUS Specimen: Uterus, cervix, bilateral ovaries and fallopian tubes Procedure: Total hysterectomy and bilateral salpingo-oophorectomy Lymph node sampling performed: No Specimen integrity: Intact Maximum tumor size (cm): 1.5 cm, glass slide measurement Histologic type: Invasive endometrioid carcinoma Grade: FIGO grade II Myometrial invasion: 1 cm where myometrium is 2.3 cm in thickness Cervical stromal involvement: No Extent of involvement of other organs: No Lymph vascular invasion: Not identified Peritoneal washings: Negative ([id number]) Lymph nodes: number examined N/A; number positive N/A TNM code: pT1a, pNX 1 oFf 3IGO Stage (based on pathologic findings, needs clinical correlation): IA  Comments: Sections the endomyometrium away from the grossly identified endometrial  polyp show an invasive  FIGO grade II endometrioid carcinoma. The tumor is confined within inner half of the myometrium. No angiolymphatic invasion is identified. No cervical stromal involvement is identified. Sections of the grossly identified endometrial polyp show an endometrial polyp with associated atypical compacted hyperplasia with no definitive evidence of carcinoma.   12/07/2017 Imaging   US venous Doppler Right: No evidence of common femoral vein obstruction. Left: Findings consistent with acute deep vein thrombosis involving the left femoral vein, left proximal profunda vein, and left popliteal vein. Unable to adequately interrogate the common femoral and higher, or the calf secondary to significant edema and body habitus   12/07/2017 [facility] Admission   She presented to the ER and was diagnosed with acute DVT   01/18/2018 - 01/21/2018 Hospital Admission   She was admitted to the hospital for management of severe persistent DVT   01/18/2018 Imaging   US venous Doppler Right: No evidence of common femoral vein obstruction. Left: Findings consistent with acute deep vein thrombosis involving the left common femoral vein, and left popliteal vein.   01/19/2018 Surgery   Pre-operative Diagnosis: Subacute DVT with severe post thrombotic syndrome Post-operative diagnosis:  Same Surgeon:  Erlene Quan C. Donzetta Matters, MD Procedure Performed: 1.  Ultrasound-guided cannulation left small saphenous vein 2.  Left lower extremity and central venography 3.  Intravascular ultrasound of left popliteal, femoral, common femoral, external and common iliac veins and IVC 4.  Stent of left common and external iliac veins with 14 x 60 mm Vici 5.  Moderate sedation with fentanyl and Versed for 50 minutes  Indications: 75 year old female with a history of DVT in October now presents with persistent left lower extremity swelling and ultrasound demonstrating likely persistent DVT.  She has been on Xarelto at this time.  She is now  indicated for venogram possible intervention.  Findings: Flow in the left lower extremity was stagnant throughout but by venogram all veins were patent.  There was a focal occlusive area approximately 2 cm in length at the common and external iliac vein junction at the hypogastric on the left.  After stenting and ballooning we had a diameter of 12 millimeters in the stent and venogram demonstrated flow in the lower extremity veins were previously was stagnant and no further residual stenosis in the left common and external iliac vein junction.   04/12/2018 Imaging   US Venous Doppler Right: No evidence of common femoral vein obstruction. Left: There is no evidence of deep vein thrombosis in the lower extremity. However, portions of this examination were limited- see technologist comments above. Left groin: Large hypoechoic area with mixed echoes noted measuring nearly 10 cm. Possible  hematoma versus unknown etiology. Ultrasound characteristics of enlarged lymph nodes noted in the groin.      05/15/2018 Imaging   US Venous Doppler Right: No evidence of deep vein thrombosis in the lower extremity. No indirect evidence of obstruction proximal to the inguinal ligament. Left: No reflux was noted in the common femoral vein , femoral vein in the thigh, popliteal vein, great saphenous vein at the saphenofemoral junction, great saphenous vein at the proximal thigh, great saphenous vein at the mid thigh, great saphenous vein  at the distal thigh, great saphenous vein at the knee, origin of the small saphenous vein, proximal small saphenous vein, and mid small saphenous vein. There is no evidence of deep vein thrombosis in the lower extremity. There is no evidence of superficial venous thrombosis. No cystic structure found in the  popliteal fossa. Unable to evaluate extension of common femoral vein obstruction proximal to the inguinal ligament.   06/01/2018 Imaging   1. Infiltrative mass within the left pelvic  sidewall measuring approximately 9.5 cm with associated pathologically enlarged left inguinal lymph node. Additionally, there is lucency involving the medial sidewall of the left acetabulum with potential nondisplaced pathologic fracture. Further evaluation with contrast-enhanced pelvic MRI could be performed as clinically indicated. 2. The left pelvic arterial and venous system is encased by this infiltrative left pelvic sidewall mass however while difficult to ascertain, the left external iliac venous stent appears patent.   06/18/2018 Pathology Results   Lymph node for lymphoma, Left Inguinal - METASTATIC ADENOCARCINOMA, SEE COMMENT. Microscopic Comment Immunohistochemistry is positive for cytokeratin 7, PAX8, ER, and PR. Cytokeratin 5/6,and p63 are negative. The immunoprofile along with the patient's history are consistent with a gynecologic primary.   06/18/2018 Surgery   Pre-op Diagnosis: INGUINAL LYMPHADENOPATHY, PELVIC MASS     Procedure(s): EXCISIONAL BIOPSY DEEP LEFT INGUINAL LYMPH NODE  Surgeon(s): Coralie Keens, MD    06/24/2018 Cancer Staging   Staging form: Corpus Uteri - Carcinoma and Carcinosarcoma, AJCC 8th Edition - Clinical: Stage IVB (cT1a, cN2, pM1) - Signed by [name], MD on 06/24/2018    Genetic Testing   Patient has genetic testing done for MMR on pathology from 06/18/2018. Results revealed patient has the following mutation(s): MMR: abnormal   06/29/2018 Procedure   Placement of a subcutaneous port device. Catheter tip at the SVC and right atrium junction.    Genetic Testing   Patient has genetic testing done for MSI on pathology from 06/18/2018. Results revealed patient has the following mutation(s): MSI: High   07/02/2018 PET scan   Previous hysterectomy, with asymmetric focus of hypermetabolic activity in the left vaginal cuff, suspicious for residual or recurrent carcinoma.  Large hypermetabolic soft tissue mass involving the left pelvic sidewall  and acetabulum, consistent with metastatic disease.  No evidence metastatic disease within the abdomen, chest, or neck.   07/09/2018 Tumor Marker   Patient's tumor was tested for the following markers: CA-125 Results of the tumor marker test revealed 9   07/10/2018 - 08/24/2018 Chemotherapy   The patient had carboplatin and taxol x 3 cycles   07/17/2018 Genetic Testing   Negative genetic testing on the common hereditary cancer panel.  The Common Hereditary Gene Panel offered by [facility] includes sequencing and/or deletion duplication testing of the following 48 genes: APC, ATM, AXIN2, BARD1, BMPR1A, BRCA1, BRCA2, BRIP1, CDH1, CDK4, CDKN2A (p14ARF), CDKN2A (p16INK4a), CHEK2, CTNNA1, DICER1, EPCAM (Deletion/duplication testing only), GREM1 (promoter region deletion/duplication testing only), KIT, MEN1, MLH1, MSH2, MSH3, MSH6, MUTYH, NBN, NF1, NHTL1, PALB2, PDGFRA, PMS2, POLD1, POLE, PTEN, RAD50, RAD51C, RAD51D, RNF43, SDHB, SDHC, SDHD, SMAD4, SMARCA4. STK11, TP53, TSC1, TSC2, and VHL.  The following genes were evaluated for sequence changes only: SDHA and HOXB13 c.251G>A variant only. The report date is Jul 17, 2018.    10/03/2018 Imaging   CT abdomen and pelvis 1.  No acute intra-abdominal process. 2. Grossly unchanged left pelvic sidewall mass with osseous involvement of the medial acetabulum. Progressive mild displacement of the associated comminuted pathologic fracture involving the right acetabulum and puboacetabular junction.  3. New venous stents extending from the left common iliac vein origin to the proximal left common femoral vein. The stents are patent.   11/06/2018 -  Chemotherapy   The patient had pembrolizumab for chemotherapy treatment.     01/28/2019 Imaging   1. No substantial interval change  in exam. 2. Interval development of mild fullness in the left intrarenal collecting system and ureter without overt hydronephrosis at this time. 3. Abnormal soft tissue along the left  pelvic sidewall has decreased slightly in the interval. 4. Similar appearance of ill-defined fascial planes in the pelvis with some peritoneal thickening along the right pelvic sidewall and potentially involving the sigmoid mesocolon. 5. No substantial ascites.   05/03/2019 Imaging   1. Stable mild left pelvic sidewall soft tissue density. No new or progressive disease identified within the abdomen or pelvis.  2. Colonic diverticulosis. No radiographic evidence of diverticulitis.   Aortic Atherosclerosis (ICD10-I70.0).   09/09/2019 Imaging   1. No change in appearance of soft tissue thickening along the LEFT pelvic sidewall adjacent to chronic LEFT acetabular fracture. 2. Mild asymmetry of the bladder wall favoring the LEFT bladder wall, not well assessed. Similar accounting for variable degrees of distension on prior studies potentially related to prior radiation, attention on follow-up. 3. Signs of venous stenting in the LEFT hemipelvis with LEFT lower extremity muscular atrophy and mild stranding with similar appearance. Signs of colonic diverticulosis and diverticular disease without change.   02/24/2020 Imaging   1. Unchanged appearance of the pelvis as detailed below. 2. Unchanged soft tissue thickening of the left pelvic sidewall. 3. 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 4. No evidence discrete mass or lymphadenopathy nor metastatic disease in the abdomen or pelvis. 5. Status post hysterectomy and cholecystectomy. 6. Left common iliac vein stent. 7. Pancolonic diverticulosis.     Metastasis to lymph nodes ([place])  06/23/2018 Initial Diagnosis   Metastasis to lymph nodes ([place])   07/10/2018 - 08/24/2018 Chemotherapy   The patient had palonosetron (ALOXI) injection 0.25 mg, 0.25 mg, Intravenous,  Once, 3 of 6 cycles Administration: 0.25 mg (07/10/2018), 0.25 mg (07/31/2018), 0.25 mg  (08/24/2018) CARBOplatin (PARAPLATIN) 480 mg in sodium chloride 0.9 % 250 mL chemo infusion, 480 mg (100 % of original dose 482.5 mg), Intravenous,  Once, 3 of 6 cycles Dose modification: 482.5 mg (original dose 482.5 mg, Cycle 1) Administration: 480 mg (07/10/2018), 480 mg (07/31/2018), 480 mg (08/24/2018) PACLitaxel (TAXOL) 276 mg in sodium chloride 0.9 % 250 mL chemo infusion (> 33m/m2), 140 mg/m2 = 276 mg (80 % of original dose 175 mg/m2), Intravenous,  Once, 3 of 6 cycles Dose modification: 140 mg/m2 (80 % of original dose 175 mg/m2, Cycle 1, Reason: Dose Not Tolerated) Administration: 276 mg (07/10/2018), 276 mg (07/31/2018), 276 mg (08/24/2018) fosaprepitant (EMEND) 150 mg, dexamethasone (DECADRON) 12 mg in sodium chloride 0.9 % 145 mL IVPB, , Intravenous,  Once, 3 of 6 cycles Administration:  (07/10/2018),  (07/31/2018),  (08/24/2018)  for chemotherapy treatment.    11/06/2018 -  Chemotherapy   The patient had pembrolizumab for chemotherapy treatment.     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  06/24/2018 Initial Diagnosis   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   07/10/2018 - 08/24/2018 Chemotherapy   The patient had palonosetron (ALOXI) injection 0.25 mg, 0.25 mg, Intravenous,  Once, 3 of 6 cycles Administration: 0.25 mg (07/10/2018), 0.25 mg (07/31/2018), 0.25 mg (08/24/2018) CARBOplatin (PARAPLATIN) 480 mg in sodium chloride 0.9 % 250 mL chemo infusion, 480 mg (100 % of original dose 482.5 mg), Intravenous,  Once, 3 of 6 cycles Dose modification: 482.5 mg (original dose 482.5 mg, Cycle 1) Administration: 480 mg (07/10/2018), 480 mg (07/31/2018), 480 mg (08/24/2018) PACLitaxel (TAXOL) 276 mg in sodium  chloride 0.9 % 250 mL chemo infusion (> 70m/m2), 140 mg/m2 = 276 mg (80 % of original dose 175 mg/m2), Intravenous,  Once, 3 of 6 cycles Dose modification: 140 mg/m2 (80 % of original dose 175 mg/m2, Cycle 1, Reason: Dose Not Tolerated) Administration: 276 mg (07/10/2018), 276 mg (07/31/2018), 276 mg (08/24/2018) fosaprepitant (EMEND)  150 mg, dexamethasone (DECADRON) 12 mg in sodium chloride 0.9 % 145 mL IVPB, , Intravenous,  Once, 3 of 6 cycles Administration:  (07/10/2018),  (07/31/2018),  (08/24/2018)  for chemotherapy treatment.    11/06/2018 -  Chemotherapy   The patient had pembrolizumab for chemotherapy treatment.     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       REVIEW OF SYSTEMS:   Constitutional: Denies fevers, chills or abnormal weight loss Eyes: Denies blurriness of vision Ears, nose, mouth, throat, and face: Denies mucositis or sore throat Respiratory: Denies cough, dyspnea or wheezes Cardiovascular: Denies palpitation, chest discomfort or lower extremity swelling Gastrointestinal:  Denies nausea, heartburn or change in bowel habits Skin: Denies abnormal skin rashes Lymphatics: Denies new lymphadenopathy or easy bruising Neurological:Denies numbness, tingling or new weaknesses Behavioral/Psych: Mood is stable, no new changes  All other systems were reviewed with the patient and are negative.  I have reviewed the past medical history, past surgical history, social history and family history with the patient and they are unchanged from previous note.  ALLERGIES:  has No Known Allergies.  MEDICATIONS:  Current Outpatient Medications  Medication Sig Dispense Refill  . apixaban (ELIQUIS) 2.5 MG TABS tablet Take by mouth 2 (two) times daily.    . diclofenac sodium (VOLTAREN) 1 % GEL APPLY 4GRAMS 4 TIMES A DAY AS NEEDED FOR PAINS    . gabapentin (NEURONTIN) 300 MG capsule Take 300 mg by mouth 2 (two) times daily.    . methadone (DOLOPHINE) 10 MG tablet Take 1 tablet (10 mg total) by mouth every 12 (twelve) hours. 60 tablet 0  . morphine (MSIR) 30 MG tablet  Take by mouth.    . Olopatadine HCl 0.2 % SOLN Place 1 drop into both eyes daily.     No current facility-administered medications for this visit.    PHYSICAL EXAMINATION: ECOG PERFORMANCE STATUS: 2 - Symptomatic, <50% confined to bed  Vitals:   03/17/20 1015  BP: 135/82  Pulse: (!) 102  Resp: 18  Temp: 98.1 F (36.7 C)  SpO2: 100%   Filed Weights   03/17/20 1015  Weight: 208 lb 9.6 oz (94.6 kg)    GENERAL:alert, no distress and comfortable SKIN: skin color, texture, turgor are normal, no rashes or significant lesions EYES: normal, Conjunctiva are pink and non-injected, sclera clear OROPHARYNX:no exudate, no erythema and lips, buccal mucosa, and tongue normal  NECK: supple, thyroid normal size, non-tender, without nodularity LYMPH:  no palpable lymphadenopathy in the cervical, axillary or inguinal LUNGS: clear to auscultation and percussion with normal breathing effort HEART: regular rate & rhythm and no murmurs and no lower extremity edema ABDOMEN:abdomen soft, non-tender and normal bowel sounds Musculoskeletal:no cyanosis of digits and no clubbing  NEURO: alert & oriented x 3 with fluent speech, no focal motor/sensory deficits  LABORATORY DATA:  I have reviewed the data as listed    Component Value Date/Time   NA 139 03/17/2020 0950   K 3.6  03/17/2020 0950   CL 107 03/17/2020 0950   CO2 25 03/17/2020 0950   GLUCOSE 100 (H) 03/17/2020 0950   BUN 15 03/17/2020 0950   CREATININE 0.71 03/17/2020 0950   CREATININE 0.66 12/24/2019 1023   CALCIUM 9.1 03/17/2020 0950   PROT 7.4 03/17/2020 0950   ALBUMIN 3.8 03/17/2020 0950   AST 9 (L) 03/17/2020 0950   AST 11 (L) 12/24/2019 1023   ALT 7 03/17/2020 0950   ALT 7 12/24/2019 1023   ALKPHOS 137 (H) 03/17/2020 0950   BILITOT 0.6 03/17/2020 0950   BILITOT 0.6 12/24/2019 1023   GFRNONAA >60 03/17/2020 0950   GFRNONAA >60 12/24/2019 1023   GFRAA >60 11/12/2019 1222    No results found for: SPEP, UPEP  Lab Results   Component Value Date   WBC 3.6 (L) 03/17/2020   NEUTROABS 2.3 03/17/2020   HGB 12.0 03/17/2020   HCT 37.1 03/17/2020   MCV 90.9 03/17/2020   PLT 215 03/17/2020      Chemistry      Component Value Date/Time   NA 139 03/17/2020 0950   K 3.6 03/17/2020 0950   CL 107 03/17/2020 0950   CO2 25 03/17/2020 0950   BUN 15 03/17/2020 0950   CREATININE 0.71 03/17/2020 0950   CREATININE 0.66 12/24/2019 1023      Component Value Date/Time   CALCIUM 9.1 03/17/2020 0950   ALKPHOS 137 (H) 03/17/2020 0950   AST 9 (L) 03/17/2020 0950   AST 11 (L) 12/24/2019 1023   ALT 7 03/17/2020 0950   ALT 7 12/24/2019 1023   BILITOT 0.6 03/17/2020 0950   BILITOT 0.6 12/24/2019 1023

## 2020-03-17 NOTE — Telephone Encounter (Signed)
-----   Message from [name], MD sent at 03/17/2020 10:04 AM EST ----- Regarding: ortho referral I sent referral 3 weeks ago, can you call and ask if they ever receive it

## 2020-03-17 NOTE — Assessment & Plan Note (Signed)
She has chronic left hip pain The left side of her hip has received significant radiation Based on her recent imaging study, it appears that she has significant joint destruction and that is causing pain I recommend orthopedic review and consideration for possible hip replacement therapy The consult was sent 3 weeks ago, will follow

## 2020-03-17 NOTE — Telephone Encounter (Signed)
Called regarding referral. They called her to [facility] and she never called back to schedule appt. Dr. Noemi Chapel does not see hip patients. Appt scheduled for 1/31 at 1200 with Dr. Edmonia Lynch. Given appt date/time to [facility]. She verbalized understanding.

## 2020-03-17 NOTE — Progress Notes (Signed)
Ok to treat with HR 102 per MD

## 2020-03-17 NOTE — Assessment & Plan Note (Signed)
She has reasonable pain control She will continue current prescribed morphine sulfate and methadone as needed We discussed narcotic refill policy 

## 2020-03-17 NOTE — Assessment & Plan Note (Signed)
She has less hematuria since discontinuation of Plavix I will order urinalysis and urine culture to rule out recurrent urinary tract infection

## 2020-03-17 NOTE — Assessment & Plan Note (Signed)
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 

## 2020-03-18 LAB — URINE CULTURE: Culture: NO GROWTH

## 2020-03-20 ENCOUNTER — Other Ambulatory Visit: Payer: Self-pay | Admitting: Hematology and Oncology

## 2020-03-20 ENCOUNTER — Telehealth: Payer: Self-pay

## 2020-03-20 DIAGNOSIS — R319 Hematuria, unspecified: Secondary | ICD-10-CM

## 2020-03-20 DIAGNOSIS — N3090 Cystitis, unspecified without hematuria: Secondary | ICD-10-CM

## 2020-03-20 MED ORDER — SULFAMETHOXAZOLE-TRIMETHOPRIM 800-160 MG PO TABS: 1.0000 | ORAL_TABLET | Freq: Two times a day (BID) | ORAL | 0 refills | Status: DC

## 2020-03-20 NOTE — Telephone Encounter (Signed)
-----   Message from [name], MD sent at 03/20/2020  9:07 AM EST ----- Regarding: cystitis Her UA showed bacteria Even though culture was negative she has symptoms I recommend 3 days course antibiotics, sent to her pharmacy Please make sure she drinks more liquids

## 2020-03-20 NOTE — Telephone Encounter (Signed)
Called and given below message. She verbalized understanding. 

## 2020-03-21 ENCOUNTER — Telehealth: Payer: Self-pay

## 2020-03-21 ENCOUNTER — Other Ambulatory Visit: Payer: Self-pay | Admitting: Hematology and Oncology

## 2020-03-21 DIAGNOSIS — R319 Hematuria, unspecified: Secondary | ICD-10-CM

## 2020-03-21 NOTE — Telephone Encounter (Signed)
Patient called to clarify if she should be taking two antibiotics that were prescribed.   RN reviewed with MD - MD recommendations to continue Amoxicillin at this time, hold Bactrium.  Pt aware and voiced understanding.

## 2020-04-04 IMAGING — CT NUCLEAR MEDICINE PET IMAGE INITIAL (PI) SKULL BASE TO THIGH
8 series · 16 of 16 positions shown · non-contrast
Comparison: AP CT on 06/01/2018

CLINICAL DATA: Initial treatment strategy for uterine carcinoma.

EXAM:
NUCLEAR MEDICINE PET SKULL BASE TO THIGH
TECHNIQUE: 9.4 mCi F-18 FDG was injected intravenously. Full-ring PET imaging
was performed from the skull base to thigh after the radiotracer. CT
data was obtained and used for attenuation correction and anatomic
localization.
Fasting blood glucose: 97 mg/dl

[Series 3: pet sk_thigh ac · axial · 5.0mm · 4.07mm/px · z∈[-706,+58]mm · 2 of 192 slices shown]
[im 1/192]
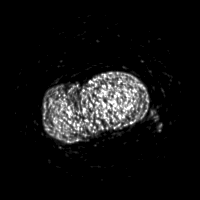
[im 192/192]
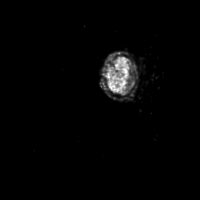

[Series 4: ct sk_thigh 5.0 hd_fov · axial · 1.17mm/px · z∈[-706,+58]mm · 3 of 192 slices shown]
[im 1/192  soft-tissue]
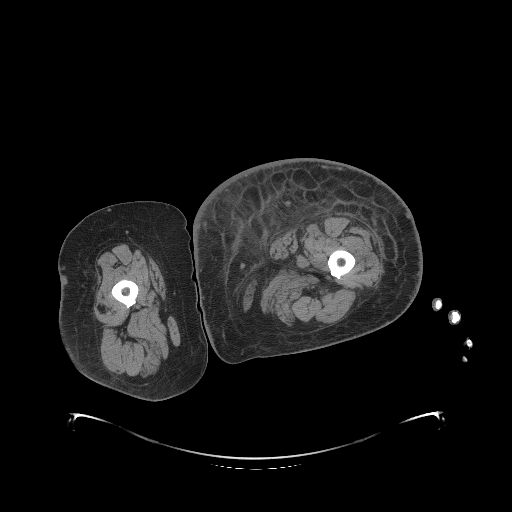
[im 96/192  soft-tissue]
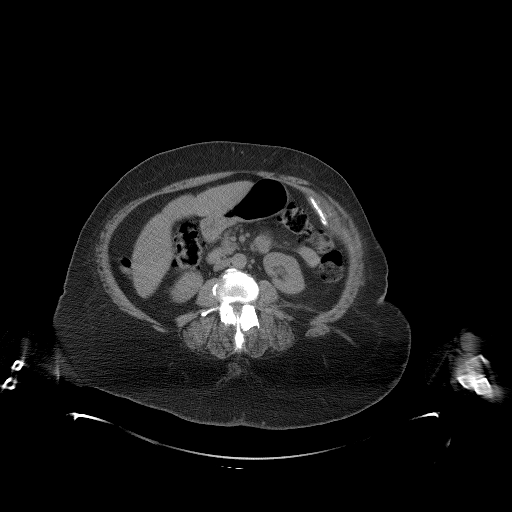
[im 192/192  soft-tissue]
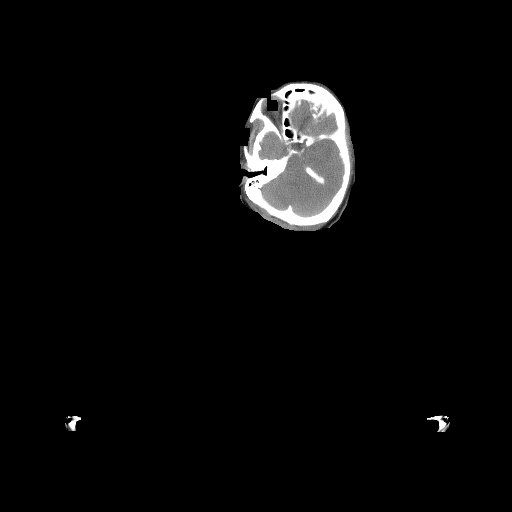

[Series 5: pet sk_thigh nac · axial · 5.0mm · 4.07mm/px · z∈[-706,+58]mm · 3 of 192 slices shown]
[im 1/192]
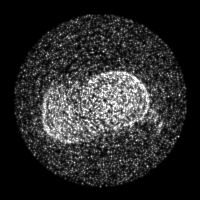
[im 96/192]
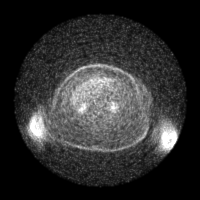
[im 192/192]
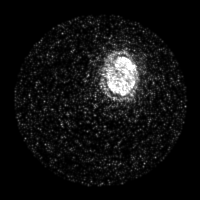

[Series 8: ct sk_thigh 5.0 b70f lung_bone · axial · 0.57mm/px · 1 of 59 slices shown]
[im 1/59  soft-tissue]
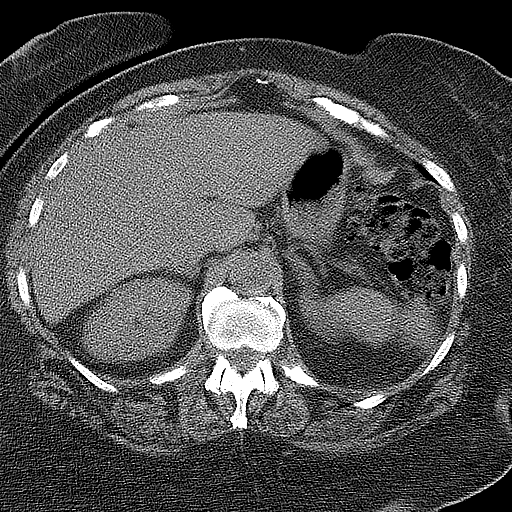

[Series 603: range-ct sk_thigh 5.0 hd_fov-cor-<alpha range> · 2 of 101 slices shown]
[im 1/101]
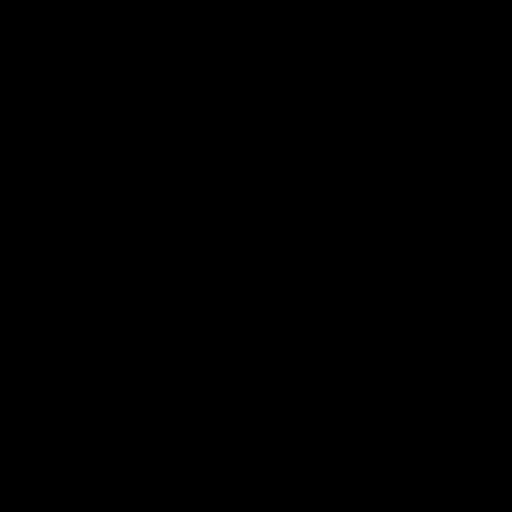
[im 101/101]
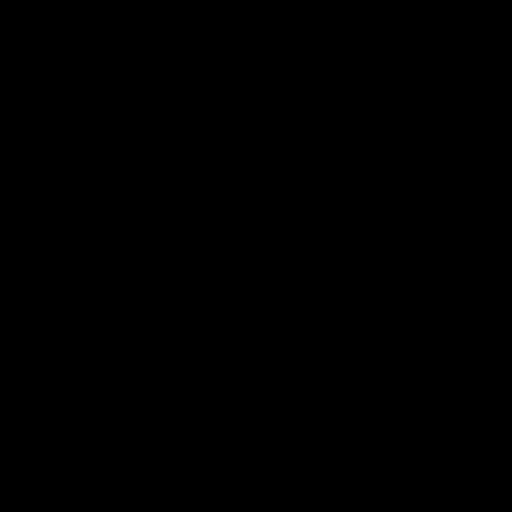

[Series 604: mip range · coronal · 1.68mm/px · 1 of 32 slices shown]
[im 1/32]
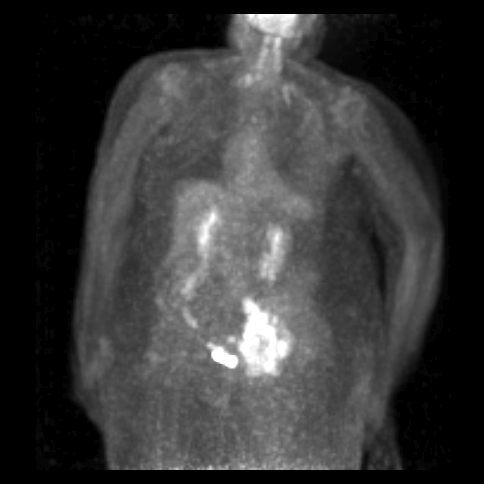

[Series 605: range-ct sk_thigh 5.0 hd_fov-tra-<alpha range> · 3 of 185 slices shown]
[im 1/185]
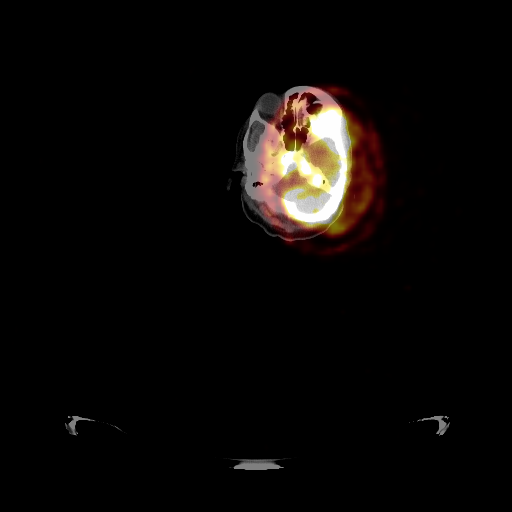
[im 93/185]
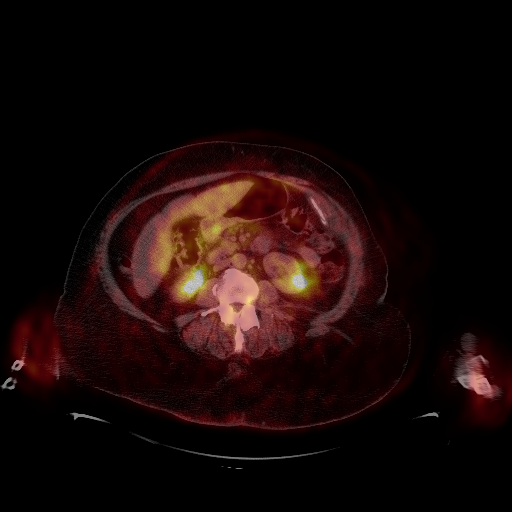
[im 185/185]
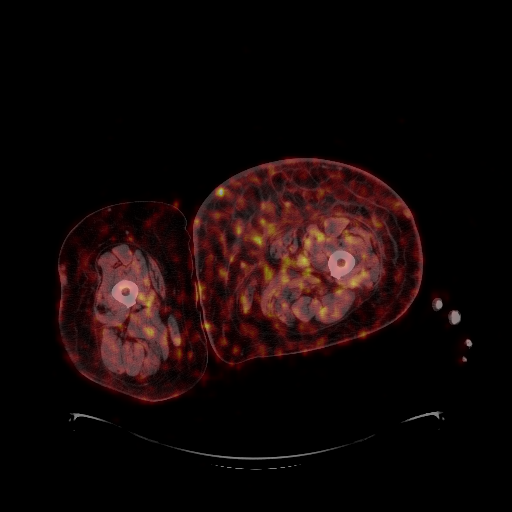

[Series 1056: results mm oncology reading · 1.2mm · 0.99mm/px · 1 of 3 slices shown]
[im 1/3]
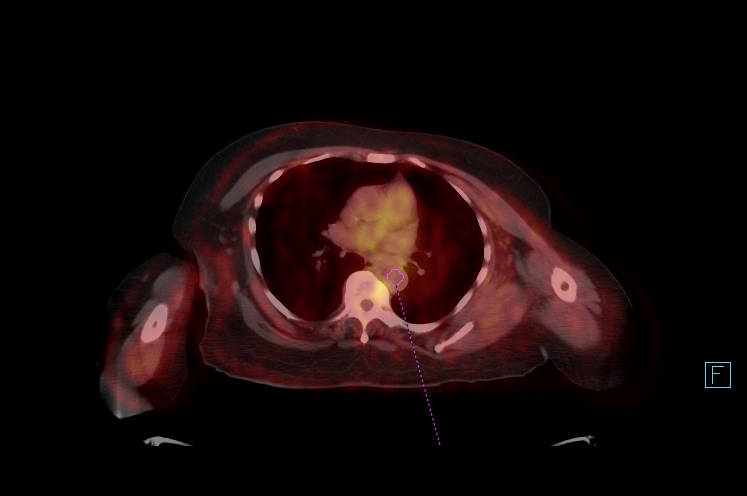

[16 of 16 positions shown; findings below may reference images not displayed]

FINDINGS: Mediastinal blood-pool activity (reference): SUV max =

Liver activity (reference): SUV max = N/A

NECK:  No hypermetabolic lymph nodes or masses.

Incidental CT findings:  None.

CHEST: No hypermetabolic masses or lymphadenopathy. No suspicious
pulmonary nodules seen on CT images.

Incidental CT findings:  None.

ABDOMEN/PELVIS: No abnormal hypermetabolic activity within the
liver, pancreas, adrenal glands, or spleen. No hypermetabolic lymph
nodes within the abdomen.

Stent seen in the left common iliac and proximal external iliac
veins. Large left pelvic wall soft tissue mass is seen which encases
this stent, and involves the left acetabulum. This measures 9.3 x
6.4 cm on image 136/4, mildly increased from 8.5 by 5.8 cm
previously. This has SUV max of 17.4. Prior hysterectomy. Asymmetric
focal FDG uptake is seen in the left vaginal cuff with SUV max of
7.9 mm.

Incidental CT findings:  None.

SKELETON: No focal hypermetabolic bone lesions to suggest skeletal
metastasis.

Incidental CT findings:  None.
IMPRESSION: Previous hysterectomy, with asymmetric focus of hypermetabolic
activity in the left vaginal cuff, suspicious for residual or
recurrent carcinoma.

Large hypermetabolic soft tissue mass involving the left pelvic
sidewall and acetabulum, consistent with metastatic disease.

No evidence metastatic disease within the abdomen, chest, or neck.

## 2020-04-07 ENCOUNTER — Inpatient Hospital Stay: Payer: Medicare Other

## 2020-04-07 ENCOUNTER — Inpatient Hospital Stay: Payer: Medicare Other | Attending: Hematology and Oncology

## 2020-04-07 ENCOUNTER — Inpatient Hospital Stay (HOSPITAL_BASED_OUTPATIENT_CLINIC_OR_DEPARTMENT_OTHER): Payer: Medicare Other | Admitting: Hematology and Oncology

## 2020-04-07 ENCOUNTER — Encounter: Payer: Self-pay | Admitting: Hematology and Oncology

## 2020-04-07 ENCOUNTER — Other Ambulatory Visit: Payer: Self-pay

## 2020-04-07 DIAGNOSIS — C649 Malignant neoplasm of unspecified kidney, except renal pelvis: Secondary | ICD-10-CM | POA: Insufficient documentation

## 2020-04-07 DIAGNOSIS — G893 Neoplasm related pain (acute) (chronic): Secondary | ICD-10-CM | POA: Diagnosis not present

## 2020-04-07 DIAGNOSIS — I825Z2 Chronic embolism and thrombosis of unspecified deep veins of left distal lower extremity: Secondary | ICD-10-CM | POA: Diagnosis not present

## 2020-04-07 DIAGNOSIS — Z7901 Long term (current) use of anticoagulants: Secondary | ICD-10-CM | POA: Diagnosis not present

## 2020-04-07 DIAGNOSIS — Z7189 Other specified counseling: Secondary | ICD-10-CM

## 2020-04-07 DIAGNOSIS — R319 Hematuria, unspecified: Secondary | ICD-10-CM | POA: Diagnosis not present

## 2020-04-07 DIAGNOSIS — C7951 Secondary malignant neoplasm of bone: Secondary | ICD-10-CM

## 2020-04-07 DIAGNOSIS — C541 Malignant neoplasm of endometrium: Secondary | ICD-10-CM | POA: Diagnosis present

## 2020-04-07 DIAGNOSIS — Z5112 Encounter for antineoplastic immunotherapy: Secondary | ICD-10-CM | POA: Insufficient documentation

## 2020-04-07 DIAGNOSIS — C55 Malignant neoplasm of uterus, part unspecified: Secondary | ICD-10-CM

## 2020-04-07 DIAGNOSIS — E039 Hypothyroidism, unspecified: Secondary | ICD-10-CM

## 2020-04-07 DIAGNOSIS — Z86718 Personal history of other venous thrombosis and embolism: Secondary | ICD-10-CM | POA: Diagnosis not present

## 2020-04-07 DIAGNOSIS — C801 Malignant (primary) neoplasm, unspecified: Secondary | ICD-10-CM

## 2020-04-07 DIAGNOSIS — Z79899 Other long term (current) drug therapy: Secondary | ICD-10-CM | POA: Diagnosis not present

## 2020-04-07 DIAGNOSIS — C774 Secondary and unspecified malignant neoplasm of inguinal and lower limb lymph nodes: Secondary | ICD-10-CM

## 2020-04-07 LAB — COMPREHENSIVE METABOLIC PANEL
ALT: 10 U/L (ref 0–44)
AST: 11 U/L — ABNORMAL LOW (ref 15–41)
Albumin: 4 g/dL (ref 3.5–5.0)
Alkaline Phosphatase: 158 U/L — ABNORMAL HIGH (ref 38–126)
Anion gap: 8 (ref 5–15)
BUN: 17 mg/dL (ref 8–23)
CO2: 25 mmol/L (ref 22–32)
Calcium: 9.2 mg/dL (ref 8.9–10.3)
Chloride: 105 mmol/L (ref 98–111)
Creatinine, Ser: 0.71 mg/dL (ref 0.44–1.00)
GFR, Estimated: >60 mL/min (ref >60)
Glucose, Bld: 94 mg/dL (ref 70–99)
Potassium: 4 mmol/L (ref 3.5–5.1)
Sodium: 138 mmol/L (ref 135–145)
Total Bilirubin: 0.8 mg/dL (ref 0.3–1.2)
Total Protein: 7.7 g/dL (ref 6.5–8.1)

## 2020-04-07 LAB — URINALYSIS, COMPLETE (UACMP) WITH MICROSCOPIC
Bilirubin Urine: NEGATIVE
Glucose, UA: NEGATIVE mg/dL
Ketones, ur: NEGATIVE mg/dL
Leukocytes,Ua: NEGATIVE
Nitrite: NEGATIVE
Protein, ur: NEGATIVE mg/dL
Specific Gravity, Urine: 1.017 (ref 1.005–1.030)
pH: 5 (ref 5.0–8.0)

## 2020-04-07 LAB — CBC WITH DIFFERENTIAL/PLATELET
Abs Immature Granulocytes: 0.01 10*3/uL (ref 0.00–0.07)
Basophils Absolute: 0 10*3/uL (ref 0.0–0.1)
Basophils Relative: 1 %
Eosinophils Absolute: 0.1 10*3/uL (ref 0.0–0.5)
Eosinophils Relative: 2 %
HCT: 38.5 % (ref 36.0–46.0)
Hemoglobin: 12.6 g/dL (ref 12.0–15.0)
Immature Granulocytes: 0 %
Lymphocytes Relative: 30 %
Lymphs Abs: 1.1 10*3/uL (ref 0.7–4.0)
MCH: 29.5 pg (ref 26.0–34.0)
MCHC: 32.7 g/dL (ref 30.0–36.0)
MCV: 90.2 fL (ref 80.0–100.0)
Monocytes Absolute: 0.5 10*3/uL (ref 0.1–1.0)
Monocytes Relative: 12 %
Neutro Abs: 2.1 10*3/uL (ref 1.7–7.7)
Neutrophils Relative %: 55 %
Platelets: 189 10*3/uL (ref 150–400)
RBC: 4.27 MIL/uL (ref 3.87–5.11)
RDW: 13.1 % (ref 11.5–15.5)
WBC: 3.8 10*3/uL — ABNORMAL LOW (ref 4.0–10.5)
nRBC: 0 % (ref 0.0–0.2)

## 2020-04-07 LAB — TSH: TSH: 1.702 u[IU]/mL (ref 0.308–3.960)

## 2020-04-07 MED ORDER — SODIUM CHLORIDE 0.9 % IV SOLN
Freq: Once | INTRAVENOUS | Status: AC
  Filled 2020-04-07: qty 250

## 2020-04-07 MED ORDER — HEPARIN SOD (PORK) LOCK FLUSH 100 UNIT/ML IV SOLN
500.0000 [IU] | Freq: Once | INTRAVENOUS | Status: AC | PRN
  Administered 2020-04-07: 500 [IU]
  Filled 2020-04-07: qty 5

## 2020-04-07 MED ORDER — SODIUM CHLORIDE 0.9% FLUSH
10.0000 mL | Freq: Once | INTRAVENOUS | Status: AC
  Administered 2020-04-07: 10 mL
  Filled 2020-04-07: qty 10

## 2020-04-07 MED ORDER — SODIUM CHLORIDE 0.9% FLUSH
10.0000 mL | INTRAVENOUS | Status: DC | PRN
  Administered 2020-04-07: 10 mL
  Filled 2020-04-07: qty 10

## 2020-04-07 MED ORDER — SODIUM CHLORIDE 0.9 % IV SOLN
200.0000 mg | Freq: Once | INTRAVENOUS | Status: AC
  Administered 2020-04-07: 200 mg via INTRAVENOUS
  Filled 2020-04-07: qty 8

## 2020-04-07 NOTE — Patient Instructions (Signed)
[facility] °Discharge Instructions for Patients Receiving Chemotherapy ° °Today you received the following immunotherapy agent: Pembrolizumab (Keytruda) ° °To help prevent nausea and vomiting after your treatment, we encourage you to take your nausea medication as directed by your MD. °  °If you develop nausea and vomiting that is not controlled by your nausea medication, call the clinic.  ° °BELOW ARE SYMPTOMS THAT SHOULD BE REPORTED IMMEDIATELY: °· *FEVER GREATER THAN 100.5 F °· *CHILLS WITH OR WITHOUT FEVER °· NAUSEA AND VOMITING THAT IS NOT CONTROLLED WITH YOUR NAUSEA MEDICATION °· *UNUSUAL SHORTNESS OF BREATH °· *UNUSUAL BRUISING OR BLEEDING °· TENDERNESS IN MOUTH AND THROAT WITH OR WITHOUT PRESENCE OF ULCERS °· *URINARY PROBLEMS °· *BOWEL PROBLEMS °· UNUSUAL RASH °Items with * indicate a potential emergency and should be followed up as soon as possible. ° °Feel free to call the clinic should you have any questions or concerns. The clinic phone number is [phone]. ° °Please show the CHEMO ALERT CARD at check-in to the Emergency Department and triage nurse. ° ° °

## 2020-04-07 NOTE — Assessment & Plan Note (Signed)
She has reasonable pain control She will continue current prescribed morphine sulfate and methadone as needed We discussed narcotic refill policy

## 2020-04-07 NOTE — Assessment & Plan Note (Signed)
She is taking both anticoagulation therapy and antiplatelet agent She has no recent bleeding Her hemoglobin is normal I reassured the patient She will continue her medications as directed

## 2020-04-07 NOTE — Progress Notes (Signed)
[place] OFFICE PROGRESS NOTE  Patient Care Team: Leslie Ebbs, MD as PCP - General (Internal Medicine)  ASSESSMENT & PLAN:  Uterine cancer [facility]) Per previous discussion, the plan would be to continue pembrolizumab indefinitely I do not plan to repeat imaging study again for few months  Lower leg DVT (deep venous thromboembolism), chronic, left ([place]) She is taking both anticoagulation therapy and antiplatelet agent She has no recent bleeding Her hemoglobin is normal I reassured the patient She will continue her medications as directed  Cancer associated pain She has reasonable pain control She will continue current prescribed morphine sulfate and methadone as needed We discussed narcotic refill policy  Hematuria She has mild intermittent hematuria It is likely due to her anticoagulation therapy She denies symptoms of cystitis I have ordered a urinalysis and urine culture We will call her with test results next week   No orders of the defined types were placed in this encounter.   All questions were answered. The patient knows to call the clinic with any problems, questions or concerns. The total time spent in the appointment was 20 minutes encounter with patients including review of chart and various tests results, discussions about plan of care and coordination of care plan   Leslie [name], MD 04/07/2020 12:58 PM  INTERVAL HISTORY: Please see below for problem oriented charting. 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 Uterus +/- tubes/ovaries, neoplastic, with left fallopian tube and ovary - INVASIVE  ENDOMETRIOID CARCINOMA (1.5 CM), FIGO GRADE II, ARISING IN A BACKGROUND OF ATYPICAL COMPLEX HYPERPLASIA, CONFINED WITHIN INNER HALF OF THE MYOMETRIUM. - ENDOMETRIAL POLYP WITH ASSOCIATED ATYPICAL COMPLEX HYPERPLASIA. - MYOMETRIUM: LEIOMYOMATA. - CERVIX: BENIGN SQUAMOUS MUCOSA AND ENDOCERVICAL MUCOSA, NO DYSPLASIA OR MALIGNANCY. - LEFT OVARY: BENIGN OVARIAN TISSUE WITH ENDOSALPINGOSIS, NO EVIDENCE OF ATYPIA OR MALIGNANCY. - LEFT FALLOPIAN TUBE: NO HISTOLOGIC ABNORMALITIES. - PLEASE SEE ONCOLOGY TEMPLATE FOR DETAIL. 2. Ovary and fallopian tube, right - BENIGN OVARIAN TISSUE WITH ENDOSALPINGOSIS, NO ATYPIA OR MALIGNANCY. - BENIGN FALLOPIAN TUBAL TISSUE, NO PATHOLOGIC ABNORMALITIES. Microscopic Comment 1. UTERUS Specimen: Uterus, cervix, bilateral ovaries and fallopian tubes Procedure: Total hysterectomy and bilateral salpingo-oophorectomy Lymph node sampling performed: No Specimen integrity: Intact Maximum tumor size (cm): 1.5 cm, glass slide measurement Histologic type: Invasive endometrioid carcinoma Grade: FIGO grade II Myometrial invasion: 1 cm where myometrium is 2.3 cm in thickness Cervical stromal involvement: No Extent of involvement of other organs: No Lymph vascular invasion: Not identified Peritoneal washings: Negative ([id number]) Lymph nodes: number examined N/A; number positive N/A TNM code: pT1a, pNX 1 oFf 3IGO Stage (based on pathologic findings, needs clinical correlation): IA  Comments: Sections the endomyometrium away from the grossly identified endometrial polyp show an invasive FIGO grade II endometrioid carcinoma. The tumor is confined within inner half of the myometrium. No angiolymphatic invasion is identified. No cervical stromal involvement is identified. Sections of the grossly identified endometrial polyp show an endometrial polyp with associated atypical compacted hyperplasia with no definitive evidence of carcinoma.   12/07/2017 Imaging   US venous  Doppler Right: No evidence of common femoral vein obstruction. Left: Findings consistent with acute deep vein thrombosis involving the left femoral  vein, left proximal profunda vein, and left popliteal vein. Unable to adequately interrogate the common femoral and higher, or the calf secondary to significant edema and body habitus   12/07/2017 [facility] Admission   She presented to the ER and was diagnosed with acute DVT   01/18/2018 - 01/21/2018 Hospital Admission   She was admitted to the hospital for management of severe persistent DVT   01/18/2018 Imaging   US venous Doppler Right: No evidence of common femoral vein obstruction. Left: Findings consistent with acute deep vein thrombosis involving the left common femoral vein, and left popliteal vein.   01/19/2018 Surgery   Pre-operative Diagnosis: Subacute DVT with severe post thrombotic syndrome Post-operative diagnosis:  Same Surgeon:  Leslie Quan C. Donzetta Matters, MD Procedure Performed: 1.  Ultrasound-guided cannulation left small saphenous vein 2.  Left lower extremity and central venography 3.  Intravascular ultrasound of left popliteal, femoral, common femoral, external and common iliac veins and IVC 4.  Stent of left common and external iliac veins with 14 x 60 mm Vici 5.  Moderate sedation with fentanyl and Versed for 50 minutes  Indications: 75 year old female with a history of DVT in October now presents with persistent left lower extremity swelling and ultrasound demonstrating likely persistent DVT.  She has been on Xarelto at this time.  She is now indicated for venogram possible intervention.  Findings: Flow in the left lower extremity was stagnant throughout but by venogram all veins were patent.  There was a focal occlusive area approximately 2 cm in length at the common and external iliac vein junction at the hypogastric on the left.  After stenting and ballooning we had a diameter of 12 millimeters in the stent and venogram  demonstrated flow in the lower extremity veins were previously was stagnant and no further residual stenosis in the left common and external iliac vein junction.   04/12/2018 Imaging   US Venous Doppler Right: No evidence of common femoral vein obstruction. Left: There is no evidence of deep vein thrombosis in the lower extremity. However, portions of this examination were limited- see technologist comments above. Left groin: Large hypoechoic area with mixed echoes noted measuring nearly 10 cm. Possible  hematoma versus unknown etiology. Ultrasound characteristics of enlarged lymph nodes noted in the groin.      05/15/2018 Imaging   US Venous Doppler Right: No evidence of deep vein thrombosis in the lower extremity. No indirect evidence of obstruction proximal to the inguinal ligament. Left: No reflux was noted in the common femoral vein , femoral vein in the thigh, popliteal vein, great saphenous vein at the saphenofemoral junction, great saphenous vein at the proximal thigh, great saphenous vein at the mid thigh, great saphenous vein  at the distal thigh, great saphenous vein at the knee, origin of the small saphenous vein, proximal small saphenous vein, and mid small saphenous vein. There is no evidence of deep vein thrombosis in the lower extremity. There is no evidence of superficial venous thrombosis. No cystic structure found in the popliteal fossa. Unable to evaluate extension of common femoral vein obstruction proximal to the inguinal ligament.   06/01/2018 Imaging   1. 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 Additionally, there is lucency involving the medial sidewall of the left acetabulum with potential nondisplaced pathologic fracture. Further evaluation with contrast-enhanced pelvic MRI could be performed as clinically indicated. 2. The left pelvic arterial and venous system is  encased  by this infiltrative left pelvic sidewall mass however while difficult to ascertain, the left external iliac venous stent appears patent.   06/18/2018 Pathology Results   Lymph node for lymphoma, Left Inguinal - METASTATIC ADENOCARCINOMA, SEE COMMENT. Microscopic Comment Immunohistochemistry is positive for cytokeratin 7, PAX8, ER, and PR. Cytokeratin 5/6,and p63 are negative. The immunoprofile along with the patient's history are consistent with a gynecologic primary.   06/18/2018 Surgery   Pre-op Diagnosis: INGUINAL LYMPHADENOPATHY, PELVIC MASS     Procedure(s): EXCISIONAL BIOPSY DEEP LEFT INGUINAL LYMPH NODE  Surgeon(s): Coralie Keens, MD    06/24/2018 Cancer Staging   Staging form: Corpus Uteri - Carcinoma and Carcinosarcoma, AJCC 8th Edition - Clinical: Stage IVB (cT1a, cN2, pM1) - Signed by Leslie [name], MD on 06/24/2018    Genetic Testing   Patient has genetic testing done for MMR on pathology from 06/18/2018. Results revealed patient has the following mutation(s): MMR: abnormal   06/29/2018 Procedure   Placement of a subcutaneous port device. Catheter tip at the SVC and right atrium junction.    Genetic Testing   Patient has genetic testing done for MSI on pathology from 06/18/2018. Results revealed patient has the following mutation(s): MSI: High   07/02/2018 PET scan   Previous hysterectomy, with asymmetric focus of hypermetabolic activity in the left vaginal cuff, suspicious for residual or recurrent carcinoma.  Large hypermetabolic soft tissue mass involving the left pelvic sidewall and acetabulum, consistent with metastatic disease.  No evidence metastatic disease within the abdomen, chest, or neck.   07/09/2018 Tumor Marker   Patient's tumor was tested for the following markers: CA-125 Results of the tumor marker test revealed 9   07/10/2018 - 08/24/2018 Chemotherapy   The patient had carboplatin and taxol x 3 cycles   07/17/2018 Genetic Testing    Negative genetic testing on the common hereditary cancer panel.  The Common Hereditary Gene Panel offered by [facility] includes sequencing and/or deletion duplication testing of the following 48 genes: APC, ATM, AXIN2, BARD1, BMPR1A, BRCA1, BRCA2, BRIP1, CDH1, CDK4, CDKN2A (p14ARF), CDKN2A (p16INK4a), CHEK2, CTNNA1, DICER1, EPCAM (Deletion/duplication testing only), GREM1 (promoter region deletion/duplication testing only), KIT, MEN1, MLH1, MSH2, MSH3, MSH6, MUTYH, NBN, NF1, NHTL1, PALB2, PDGFRA, PMS2, POLD1, POLE, PTEN, RAD50, RAD51C, RAD51D, RNF43, SDHB, SDHC, SDHD, SMAD4, SMARCA4. STK11, TP53, TSC1, TSC2, and VHL.  The following genes were evaluated for sequence changes only: SDHA and HOXB13 c.251G>A variant only. The report date is Jul 17, 2018.    10/03/2018 Imaging   CT abdomen and pelvis 1.  No acute intra-abdominal process. 2. Grossly unchanged left pelvic sidewall mass with osseous involvement of the medial acetabulum. Progressive mild displacement of the associated comminuted pathologic fracture involving the right acetabulum and puboacetabular junction.  3. New venous stents extending from the left common iliac vein origin to the proximal left common femoral vein. The stents are patent.   11/06/2018 -  Chemotherapy   The patient had pembrolizumab for chemotherapy treatment.     01/28/2019 Imaging   1. No substantial interval change in exam. 2. Interval development of mild fullness in the left intrarenal collecting system and ureter without overt hydronephrosis at this time. 3. Abnormal soft tissue along the left pelvic sidewall has decreased slightly in the interval. 4. Similar appearance of ill-defined fascial planes in the pelvis with some peritoneal thickening along the right pelvic sidewall and potentially involving the sigmoid mesocolon. 5. No substantial ascites.   05/03/2019 Imaging   1. Stable mild left pelvic sidewall soft tissue density. No  new or progressive disease identified  within the abdomen or pelvis.  2. Colonic diverticulosis. No radiographic evidence of diverticulitis.   Aortic Atherosclerosis (ICD10-I70.0).   09/09/2019 Imaging   1. No change in appearance of soft tissue thickening along the LEFT pelvic sidewall adjacent to chronic LEFT acetabular fracture. 2. Mild asymmetry of the bladder wall favoring the LEFT bladder wall, not well assessed. Similar accounting for variable degrees of distension on prior studies potentially related to prior radiation, attention on follow-up. 3. Signs of venous stenting in the LEFT hemipelvis with LEFT lower extremity muscular atrophy and mild stranding with similar appearance. Signs of colonic diverticulosis and diverticular disease without change.   02/24/2020 Imaging   1. Unchanged appearance of the pelvis as detailed below. 2. Unchanged soft tissue thickening of the left pelvic sidewall. 3. 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 4. No evidence discrete mass or lymphadenopathy nor metastatic disease in the abdomen or pelvis. 5. Status post hysterectomy and cholecystectomy. 6. Left common iliac vein stent. 7. Pancolonic diverticulosis.     Metastasis to lymph nodes ([place])  06/23/2018 Initial Diagnosis   Metastasis to lymph nodes ([place])   07/10/2018 - 08/24/2018 Chemotherapy   The patient had palonosetron (ALOXI) injection 0.25 mg, 0.25 mg, Intravenous,  Once, 3 of 6 cycles Administration: 0.25 mg (07/10/2018), 0.25 mg (07/31/2018), 0.25 mg (08/24/2018) CARBOplatin (PARAPLATIN) 480 mg in sodium chloride 0.9 % 250 mL chemo infusion, 480 mg (100 % of original dose 482.5 mg), Intravenous,  Once, 3 of 6 cycles Dose modification: 482.5 mg (original dose 482.5 mg, Cycle 1) Administration: 480 mg (07/10/2018), 480 mg (07/31/2018), 480 mg (08/24/2018) PACLitaxel (TAXOL) 276 mg in sodium chloride 0.9 % 250 mL chemo infusion (> 105m/m2), 140  mg/m2 = 276 mg (80 % of original dose 175 mg/m2), Intravenous,  Once, 3 of 6 cycles Dose modification: 140 mg/m2 (80 % of original dose 175 mg/m2, Cycle 1, Reason: Dose Not Tolerated) Administration: 276 mg (07/10/2018), 276 mg (07/31/2018), 276 mg (08/24/2018) fosaprepitant (EMEND) 150 mg, dexamethasone (DECADRON) 12 mg in sodium chloride 0.9 % 145 mL IVPB, , Intravenous,  Once, 3 of 6 cycles Administration:  (07/10/2018),  (07/31/2018),  (08/24/2018)  for chemotherapy treatment.    11/06/2018 -  Chemotherapy   The patient had pembrolizumab for chemotherapy treatment.     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  06/24/2018 Initial Diagnosis   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   07/10/2018 - 08/24/2018 Chemotherapy   The patient had palonosetron (ALOXI) injection 0.25 mg, 0.25 mg, Intravenous,  Once, 3 of 6 cycles Administration: 0.25 mg (07/10/2018), 0.25 mg (07/31/2018), 0.25 mg (08/24/2018) CARBOplatin (PARAPLATIN) 480 mg in sodium chloride 0.9 % 250 mL chemo infusion, 480 mg (100 % of original dose 482.5 mg), Intravenous,  Once, 3 of 6 cycles Dose modification: 482.5 mg (original dose 482.5 mg, Cycle 1) Administration: 480 mg (07/10/2018), 480 mg (07/31/2018), 480 mg (08/24/2018) PACLitaxel (TAXOL) 276 mg in sodium chloride 0.9 % 250 mL chemo infusion (> 828mm2), 140 mg/m2 = 276 mg (80 % of original dose 175 mg/m2), Intravenous,  Once, 3 of 6 cycles Dose modification: 140 mg/m2 (80 % of original dose 175 mg/m2, Cycle 1, Reason: Dose Not Tolerated) Administration: 276 mg (07/10/2018), 276 mg (07/31/2018), 276 mg (08/24/2018) fosaprepitant (EMEND) 150 mg, dexamethasone (DECADRON) 12 mg in sodium chloride 0.9 % 145 mL IVPB, , Intravenous,  Once, 3 of 6 cycles Administration:  (07/10/2018),  (  07/31/2018),  (08/24/2018)  for chemotherapy treatment.    11/06/2018 -  Chemotherapy   The patient had pembrolizumab for chemotherapy treatment.     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       REVIEW OF SYSTEMS:   Constitutional: Denies fevers, chills or abnormal weight loss Eyes: Denies blurriness of vision Ears, nose, mouth, throat, and face: Denies mucositis or sore throat Respiratory: Denies cough, dyspnea or wheezes Cardiovascular: Denies palpitation, chest discomfort or lower extremity swelling Gastrointestinal:  Denies nausea, heartburn or change in bowel habits Skin: Denies abnormal skin rashes Lymphatics: Denies new lymphadenopathy or easy bruising Neurological:Denies numbness, tingling or new weaknesses Behavioral/Psych: Mood is stable, no new changes  All other systems were reviewed with the patient and are negative.  I have reviewed the past medical history, past surgical history, social history and family history with the patient and they are unchanged from previous note.  ALLERGIES:  has No Known Allergies.  MEDICATIONS:  Current Outpatient Medications  Medication Sig Dispense Refill  . amoxicillin (AMOXIL) 500 MG tablet Take 1 tablet (500 mg total) by mouth 2 (two) times daily. 14 tablet 0  . apixaban (ELIQUIS) 2.5 MG TABS tablet Take by mouth 2 (two) times daily.    . diclofenac sodium (VOLTAREN) 1 % GEL APPLY 4GRAMS 4 TIMES A DAY AS NEEDED FOR PAINS    . gabapentin (NEURONTIN) 300 MG capsule Take 300 mg by mouth 2 (two) times daily.    . methadone (DOLOPHINE) 10 MG tablet Take 1 tablet (10 mg total) by mouth every 12 (twelve) hours. 60 tablet 0  . morphine (MSIR) 30 MG tablet Take by mouth.    . Olopatadine HCl 0.2 % SOLN Place 1 drop into both eyes daily.    Marland Kitchen sulfamethoxazole-trimethoprim (BACTRIM DS) 800-160 MG tablet Take 1 tablet by mouth 2 (two) times daily. 6 tablet 0   No current facility-administered medications for this visit.    Facility-Administered Medications Ordered in Other Visits  Medication Dose Route Frequency Provider Last Rate Last Admin  . sodium chloride flush (NS) 0.9 % injection 10 mL  10 mL Intracatheter PRN Alvy Bimler, Kyo Cocuzza, MD   10 mL at 04/07/20 1236    PHYSICAL EXAMINATION: ECOG PERFORMANCE STATUS: 1 - Symptomatic but completely ambulatory  Vitals:   04/07/20 1054  BP: (!) 117/54  Pulse: 74  Resp: 18  Temp: 97.7 F (36.5 C)  SpO2: 99%   Filed Weights   04/07/20 1054  Weight: 204 lb 3.2 oz (92.6 kg)    GENERAL:alert, no distress and comfortable SKIN: skin color, texture, turgor are normal, no rashes or significant lesions EYES: normal, Conjunctiva are pink and non-injected, sclera clear OROPHARYNX:no exudate, no erythema and lips, buccal mucosa, and tongue normal  NECK: supple, thyroid normal size, non-tender, without nodularity LYMPH:  no palpable lymphadenopathy in the cervical, axillary or inguinal LUNGS: clear to auscultation and percussion with normal breathing effort HEART: regular rate & rhythm and no murmurs and no lower extremity edema ABDOMEN:abdomen soft, non-tender and normal bowel sounds Musculoskeletal:no cyanosis of digits and no clubbing  NEURO: alert & oriented x 3 with fluent speech, no focal motor/sensory deficits  LABORATORY DATA:  I have reviewed the data as listed    Component Value Date/Time   NA 138 04/07/2020 1032  K 4.0 04/07/2020 1032   CL 105 04/07/2020 1032   CO2 25 04/07/2020 1032   GLUCOSE 94 04/07/2020 1032   BUN 17 04/07/2020 1032   CREATININE 0.71 04/07/2020 1032   CREATININE 0.66 12/24/2019 1023   CALCIUM 9.2 04/07/2020 1032   PROT 7.7 04/07/2020 1032   ALBUMIN 4.0 04/07/2020 1032   AST 11 (L) 04/07/2020 1032   AST 11 (L) 12/24/2019 1023   ALT 10 04/07/2020 1032   ALT 7 12/24/2019 1023   ALKPHOS 158 (H) 04/07/2020 1032   BILITOT 0.8 04/07/2020 1032   BILITOT 0.6 12/24/2019 1023   GFRNONAA >60 04/07/2020 1032   GFRNONAA >60  12/24/2019 1023   GFRAA >60 11/12/2019 1222    No results found for: SPEP, UPEP  Lab Results  Component Value Date   WBC 3.8 (L) 04/07/2020   NEUTROABS 2.1 04/07/2020   HGB 12.6 04/07/2020   HCT 38.5 04/07/2020   MCV 90.2 04/07/2020   PLT 189 04/07/2020      Chemistry      Component Value Date/Time   NA 138 04/07/2020 1032   K 4.0 04/07/2020 1032   CL 105 04/07/2020 1032   CO2 25 04/07/2020 1032   BUN 17 04/07/2020 1032   CREATININE 0.71 04/07/2020 1032   CREATININE 0.66 12/24/2019 1023      Component Value Date/Time   CALCIUM 9.2 04/07/2020 1032   ALKPHOS 158 (H) 04/07/2020 1032   AST 11 (L) 04/07/2020 1032   AST 11 (L) 12/24/2019 1023   ALT 10 04/07/2020 1032   ALT 7 12/24/2019 1023   BILITOT 0.8 04/07/2020 1032   BILITOT 0.6 12/24/2019 1023       RADIOGRAPHIC STUDIES: I have personally reviewed the radiological images as listed and agreed with the findings in the report. VAS [place] IVC/ILIAC (VENOUS ONLY)  Result Date: 03/13/2020 IVC/ILIAC STUDY Other Factors: Patient reports that edema has improved but has periodic edema                and mild foot pain. Vascular Interventions: 09/08/2018 Mechanical thrombectomy of left common iliac,                         external iliac, and common femoral veins and stent of                         left external and common femoral veins.  Performing Technologist: Delorise Shiner RVT  Examination Guidelines: A complete evaluation includes B-mode imaging, spectral Doppler, color Doppler, and power Doppler as needed of all accessible portions of each vessel. Bilateral testing is considered an integral part of a complete examination. Limited examinations for reoccurring indications may be performed as noted.  IVC/Iliac Findings: +----------+------+--------+--------+    IVC    PatentThrombusComments +----------+------+--------+--------+ IVC Mid   patent                 +----------+------+--------+--------+ IVC Distalpatent                  +----------+------+--------+--------+  +-------------------+---------+-----------+---------+-----------+--------+         CIV        RT-PatentRT-ThrombusLT-PatentLT-ThrombusComments +-------------------+---------+-----------+---------+-----------+--------+ Common Iliac Prox   patent              patent                      +-------------------+---------+-----------+---------+-----------+--------+ Common Iliac Distal patent  patent                      +-------------------+---------+-----------+---------+-----------+--------+ Patent left common iliac vein stent. +-------------------------+---------+-----------+---------+-----------+--------+            EIV           RT-PatentRT-ThrombusLT-PatentLT-ThrombusComments +-------------------------+---------+-----------+---------+-----------+--------+ External Iliac Vein Prox  patent              patent                      +-------------------------+---------+-----------+---------+-----------+--------+ External Iliac Vein       patent              patent                      Distal                                                                    +-------------------------+---------+-----------+---------+-----------+--------+  Patent external iliac vein stent.  Summary: IVC/Iliac: No evidence of thrombus in IVC and Iliac veins.  *See table(s) above for measurements and observations.  Electronically signed by Harold Barban MD on 03/13/2020 at 12:37:38 PM.   Final

## 2020-04-07 NOTE — Patient Instructions (Signed)
Implanted Port Insertion, Care After This sheet gives you information about how to care for yourself after your procedure. Your health care provider may also give you more specific instructions. If you have problems or questions, contact your health care provider. What can I expect after the procedure? After the procedure, it is common to have:  Discomfort at the port insertion site.  Bruising on the skin over the port. This should improve over 3-4 days. Follow these instructions at home: Port care  After your port is placed, you will get a manufacturer's information card. The card has information about your port. Keep this card with you at all times.  Take care of the port as told by your health care provider. Ask your health care provider if you or a family member can get training for taking care of the port at home. A home health care nurse may also take care of the port.  Make sure to remember what type of port you have. Incision care  Follow instructions from your health care provider about how to take care of your port insertion site. Make sure you: ? Wash your hands with soap and water before and after you change your bandage (dressing). If soap and water are not available, use hand sanitizer. ? Change your dressing as told by your health care provider. ? Leave stitches (sutures), skin glue, or adhesive strips in place. These skin closures may need to stay in place for 2 weeks or longer. If adhesive strip edges start to loosen and curl up, you may trim the loose edges. Do not remove adhesive strips completely unless your health care provider tells you to do that.  Check your port insertion site every day for signs of infection. Check for: ? Redness, swelling, or pain. ? Fluid or blood. ? Warmth. ? Pus or a bad smell.      Activity  Return to your normal activities as told by your health care provider. Ask your health care provider what activities are safe for you.  Do not  lift anything that is heavier than 10 lb (4.5 kg), or the limit that you are told, until your health care provider says that it is safe. General instructions  Take over-the-counter and prescription medicines only as told by your health care provider.  Do not take baths, swim, or use a hot tub until your health care provider approves. Ask your health care provider if you may take showers. You may only be allowed to take sponge baths.  Do not drive for 24 hours if you were given a sedative during your procedure.  Wear a medical alert bracelet in case of an emergency. This will tell any health care providers that you have a port.  Keep all follow-up visits as told by your health care provider. This is important. Contact a health care provider if:  You cannot flush your port with saline as directed, or you cannot draw blood from the port.  You have a fever or chills.  You have redness, swelling, or pain around your port insertion site.  You have fluid or blood coming from your port insertion site.  Your port insertion site feels warm to the touch.  You have pus or a bad smell coming from the port insertion site. Get help right away if:  You have chest pain or shortness of breath.  You have bleeding from your port that you cannot control. Summary  Take care of the port as told by your   health care provider. Keep the manufacturer's information card with you at all times.  Change your dressing as told by your health care provider.  Contact a health care provider if you have a fever or chills or if you have redness, swelling, or pain around your port insertion site.  Keep all follow-up visits as told by your health care provider. This information is not intended to replace advice given to you by your health care provider. Make sure you discuss any questions you have with your health care provider. Document Revised: 09/02/2017 Document Reviewed: 09/02/2017 [employer] Patient Education   2021 [employer]  

## 2020-04-07 NOTE — Assessment & Plan Note (Signed)
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

## 2020-04-07 NOTE — Assessment & Plan Note (Signed)
She has mild intermittent hematuria It is likely due to her anticoagulation therapy She denies symptoms of cystitis I have ordered a urinalysis and urine culture We will call her with test results next week

## 2020-04-08 LAB — URINE CULTURE: Culture: <10000 — AB

## 2020-04-10 ENCOUNTER — Telehealth: Payer: Self-pay

## 2020-04-10 NOTE — Telephone Encounter (Signed)
-----   Message from [name], MD sent at 04/10/2020  1:22 PM EST ----- Regarding: pls call and let her know urine test is not showing UTI

## 2020-04-10 NOTE — Telephone Encounter (Signed)
Per Dr Alvy Bimler, TC to Pt to inform her urine test showed no signs of UTI. Pt. Verbalized understanding. No further problems or concerns noted.

## 2020-05-05 ENCOUNTER — Inpatient Hospital Stay: Payer: Medicare Other

## 2020-05-05 ENCOUNTER — Other Ambulatory Visit: Payer: Self-pay

## 2020-05-05 ENCOUNTER — Encounter: Payer: Self-pay | Admitting: Hematology and Oncology

## 2020-05-05 ENCOUNTER — Inpatient Hospital Stay (HOSPITAL_BASED_OUTPATIENT_CLINIC_OR_DEPARTMENT_OTHER): Payer: Medicare Other | Admitting: Hematology and Oncology

## 2020-05-05 ENCOUNTER — Inpatient Hospital Stay: Payer: Medicare Other | Attending: Hematology and Oncology

## 2020-05-05 DIAGNOSIS — G893 Neoplasm related pain (acute) (chronic): Secondary | ICD-10-CM | POA: Insufficient documentation

## 2020-05-05 DIAGNOSIS — C541 Malignant neoplasm of endometrium: Secondary | ICD-10-CM | POA: Diagnosis present

## 2020-05-05 DIAGNOSIS — Z7901 Long term (current) use of anticoagulants: Secondary | ICD-10-CM | POA: Diagnosis not present

## 2020-05-05 DIAGNOSIS — Z5112 Encounter for antineoplastic immunotherapy: Secondary | ICD-10-CM | POA: Insufficient documentation

## 2020-05-05 DIAGNOSIS — K5730 Diverticulosis of large intestine without perforation or abscess without bleeding: Secondary | ICD-10-CM | POA: Diagnosis not present

## 2020-05-05 DIAGNOSIS — C801 Malignant (primary) neoplasm, unspecified: Secondary | ICD-10-CM

## 2020-05-05 DIAGNOSIS — Z9221 Personal history of antineoplastic chemotherapy: Secondary | ICD-10-CM | POA: Insufficient documentation

## 2020-05-05 DIAGNOSIS — I700 Atherosclerosis of aorta: Secondary | ICD-10-CM | POA: Insufficient documentation

## 2020-05-05 DIAGNOSIS — C55 Malignant neoplasm of uterus, part unspecified: Secondary | ICD-10-CM

## 2020-05-05 DIAGNOSIS — Z79899 Other long term (current) drug therapy: Secondary | ICD-10-CM | POA: Insufficient documentation

## 2020-05-05 DIAGNOSIS — I825Z2 Chronic embolism and thrombosis of unspecified deep veins of left distal lower extremity: Secondary | ICD-10-CM | POA: Diagnosis not present

## 2020-05-05 DIAGNOSIS — E039 Hypothyroidism, unspecified: Secondary | ICD-10-CM

## 2020-05-05 DIAGNOSIS — C7951 Secondary malignant neoplasm of bone: Secondary | ICD-10-CM

## 2020-05-05 DIAGNOSIS — C774 Secondary and unspecified malignant neoplasm of inguinal and lower limb lymph nodes: Secondary | ICD-10-CM

## 2020-05-05 DIAGNOSIS — Z86718 Personal history of other venous thrombosis and embolism: Secondary | ICD-10-CM | POA: Diagnosis not present

## 2020-05-05 DIAGNOSIS — Z7189 Other specified counseling: Secondary | ICD-10-CM

## 2020-05-05 LAB — COMPREHENSIVE METABOLIC PANEL
ALT: 12 U/L (ref 0–44)
AST: 12 U/L — ABNORMAL LOW (ref 15–41)
Albumin: 3.8 g/dL (ref 3.5–5.0)
Alkaline Phosphatase: 153 U/L — ABNORMAL HIGH (ref 38–126)
Anion gap: 8 (ref 5–15)
BUN: 18 mg/dL (ref 8–23)
CO2: 25 mmol/L (ref 22–32)
Calcium: 9.2 mg/dL (ref 8.9–10.3)
Chloride: 105 mmol/L (ref 98–111)
Creatinine, Ser: 0.7 mg/dL (ref 0.44–1.00)
GFR, Estimated: >60 mL/min (ref >60)
Glucose, Bld: 96 mg/dL (ref 70–99)
Potassium: 3.9 mmol/L (ref 3.5–5.1)
Sodium: 138 mmol/L (ref 135–145)
Total Bilirubin: 0.5 mg/dL (ref 0.3–1.2)
Total Protein: 7.5 g/dL (ref 6.5–8.1)

## 2020-05-05 LAB — CBC WITH DIFFERENTIAL/PLATELET
Abs Immature Granulocytes: 0.01 10*3/uL (ref 0.00–0.07)
Basophils Absolute: 0 10*3/uL (ref 0.0–0.1)
Basophils Relative: 1 %
Eosinophils Absolute: 0 10*3/uL (ref 0.0–0.5)
Eosinophils Relative: 1 %
HCT: 36.7 % (ref 36.0–46.0)
Hemoglobin: 12.2 g/dL (ref 12.0–15.0)
Immature Granulocytes: 0 %
Lymphocytes Relative: 23 %
Lymphs Abs: 1 10*3/uL (ref 0.7–4.0)
MCH: 30 pg (ref 26.0–34.0)
MCHC: 33.2 g/dL (ref 30.0–36.0)
MCV: 90.4 fL (ref 80.0–100.0)
Monocytes Absolute: 0.5 10*3/uL (ref 0.1–1.0)
Monocytes Relative: 11 %
Neutro Abs: 2.7 10*3/uL (ref 1.7–7.7)
Neutrophils Relative %: 64 %
Platelets: 201 10*3/uL (ref 150–400)
RBC: 4.06 MIL/uL (ref 3.87–5.11)
RDW: 13.3 % (ref 11.5–15.5)
WBC: 4.1 10*3/uL (ref 4.0–10.5)
nRBC: 0 % (ref 0.0–0.2)

## 2020-05-05 LAB — TSH: TSH: 2.006 u[IU]/mL (ref 0.308–3.960)

## 2020-05-05 MED ORDER — SODIUM CHLORIDE 0.9 % IV SOLN
200.0000 mg | Freq: Once | INTRAVENOUS | Status: AC
  Administered 2020-05-05: 200 mg via INTRAVENOUS
  Filled 2020-05-05: qty 8

## 2020-05-05 MED ORDER — SODIUM CHLORIDE 0.9 % IV SOLN
Freq: Once | INTRAVENOUS | Status: AC
  Filled 2020-05-05: qty 250

## 2020-05-05 MED ORDER — SODIUM CHLORIDE 0.9% FLUSH
10.0000 mL | INTRAVENOUS | Status: DC | PRN
  Administered 2020-05-05: 10 mL
  Filled 2020-05-05: qty 10

## 2020-05-05 MED ORDER — SODIUM CHLORIDE 0.9% FLUSH
10.0000 mL | Freq: Once | INTRAVENOUS | Status: AC
  Administered 2020-05-05: 10 mL
  Filled 2020-05-05: qty 10

## 2020-05-05 MED ORDER — HEPARIN SOD (PORK) LOCK FLUSH 100 UNIT/ML IV SOLN
500.0000 [IU] | Freq: Once | INTRAVENOUS | Status: AC | PRN
  Administered 2020-05-05: 500 [IU]
  Filled 2020-05-05: qty 5

## 2020-05-05 NOTE — Assessment & Plan Note (Signed)
She is taking both anticoagulation therapy and antiplatelet agent She has no recent bleeding Her hemoglobin is normal I reassured the patient She will continue her medications as directed

## 2020-05-05 NOTE — Assessment & Plan Note (Signed)
She has reasonable pain control She will continue current prescribed morphine sulfate and methadone as needed We discussed narcotic refill policy

## 2020-05-05 NOTE — Assessment & Plan Note (Signed)
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 

## 2020-05-05 NOTE — Patient Instructions (Signed)

## 2020-05-05 NOTE — Progress Notes (Signed)
[place] OFFICE PROGRESS NOTE  Patient Care Team: Nolene Ebbs, MD as PCP - General (Internal Medicine)  ASSESSMENT & PLAN:  Uterine cancer [facility]) Per previous discussion, the plan would be to continue pembrolizumab indefinitely I do not plan to repeat imaging study again for few months, probably around July  Cancer associated pain She has reasonable pain control She will continue current prescribed morphine sulfate and methadone as needed We discussed narcotic refill policy  Lower leg DVT (deep venous thromboembolism), chronic, left ([place]) She is taking both anticoagulation therapy and antiplatelet agent She has no recent bleeding Her hemoglobin is normal I reassured the patient She will continue her medications as directed   No orders of the defined types were placed in this encounter.   All questions were answered. The patient knows to call the clinic with any problems, questions or concerns. The total time spent in the appointment was 20 minutes encounter with patients including review of chart and various tests results, discussions about plan of care and coordination of care plan   [name], MD 05/05/2020 12:34 PM  INTERVAL HISTORY: Please see below for problem oriented charting. 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 Uterus +/- tubes/ovaries, neoplastic, with left fallopian tube and ovary - INVASIVE ENDOMETRIOID CARCINOMA (1.5 CM), FIGO GRADE II, ARISING IN A BACKGROUND OF ATYPICAL COMPLEX HYPERPLASIA, CONFINED WITHIN INNER HALF OF THE  MYOMETRIUM. - ENDOMETRIAL POLYP WITH ASSOCIATED ATYPICAL COMPLEX HYPERPLASIA. - MYOMETRIUM: LEIOMYOMATA. - CERVIX: BENIGN SQUAMOUS MUCOSA AND ENDOCERVICAL MUCOSA, NO DYSPLASIA OR MALIGNANCY. - LEFT OVARY: BENIGN OVARIAN TISSUE WITH ENDOSALPINGOSIS, NO EVIDENCE OF ATYPIA OR MALIGNANCY. - LEFT FALLOPIAN TUBE: NO HISTOLOGIC ABNORMALITIES. - PLEASE SEE ONCOLOGY TEMPLATE FOR DETAIL. 2. Ovary and fallopian tube, right - BENIGN OVARIAN TISSUE WITH ENDOSALPINGOSIS, NO ATYPIA OR MALIGNANCY. - BENIGN FALLOPIAN TUBAL TISSUE, NO PATHOLOGIC ABNORMALITIES. Microscopic Comment 1. UTERUS Specimen: Uterus, cervix, bilateral ovaries and fallopian tubes Procedure: Total hysterectomy and bilateral salpingo-oophorectomy Lymph node sampling performed: No Specimen integrity: Intact Maximum tumor size (cm): 1.5 cm, glass slide measurement Histologic type: Invasive endometrioid carcinoma Grade: FIGO grade II Myometrial invasion: 1 cm where myometrium is 2.3 cm in thickness Cervical stromal involvement: No Extent of involvement of other organs: No Lymph vascular invasion: Not identified Peritoneal washings: Negative ([id number]) Lymph nodes: number examined N/A; number positive N/A TNM code: pT1a, pNX 1 oFf 3IGO Stage (based on pathologic findings, needs clinical correlation): IA  Comments: Sections the endomyometrium away from the grossly identified endometrial polyp show an invasive FIGO grade II endometrioid carcinoma. The tumor is confined within inner half of the myometrium. No angiolymphatic invasion is identified. No cervical stromal involvement is identified. Sections of the grossly identified endometrial polyp show an endometrial polyp with associated atypical compacted hyperplasia with no definitive evidence of carcinoma.   12/07/2017 Imaging   US venous Doppler Right: No evidence of common femoral vein obstruction. Left: Findings consistent with acute deep vein thrombosis involving the left  femoral vein, left proximal profunda vein, and left popliteal vein. Unable to adequately interrogate the common femoral and higher, or the calf secondary to significant edema and  body habitus   12/07/2017 [facility] Admission   She presented to the ER and was diagnosed with acute DVT   01/18/2018 - 01/21/2018 Hospital Admission   She was admitted to the hospital for management of severe persistent DVT   01/18/2018 Imaging   US venous Doppler Right: No evidence of common femoral vein obstruction. Left: Findings consistent with acute deep vein thrombosis involving the left common femoral vein, and left popliteal vein.   01/19/2018 Surgery   Pre-operative Diagnosis: Subacute DVT with severe post thrombotic syndrome Post-operative diagnosis:  Same Surgeon:  Erlene Quan C. Donzetta Matters, MD Procedure Performed: 1.  Ultrasound-guided cannulation left small saphenous vein 2.  Left lower extremity and central venography 3.  Intravascular ultrasound of left popliteal, femoral, common femoral, external and common iliac veins and IVC 4.  Stent of left common and external iliac veins with 14 x 60 mm Vici 5.  Moderate sedation with fentanyl and Versed for 50 minutes  Indications: 75 year old female with a history of DVT in October now presents with persistent left lower extremity swelling and ultrasound demonstrating likely persistent DVT.  She has been on Xarelto at this time.  She is now indicated for venogram possible intervention.  Findings: Flow in the left lower extremity was stagnant throughout but by venogram all veins were patent.  There was a focal occlusive area approximately 2 cm in length at the common and external iliac vein junction at the hypogastric on the left.  After stenting and ballooning we had a diameter of 12 millimeters in the stent and venogram demonstrated flow in the lower extremity veins were previously was stagnant and no further residual stenosis in the left common and external iliac  vein junction.   04/12/2018 Imaging   US Venous Doppler Right: No evidence of common femoral vein obstruction. Left: There is no evidence of deep vein thrombosis in the lower extremity. However, portions of this examination were limited- see technologist comments above. Left groin: Large hypoechoic area with mixed echoes noted measuring nearly 10 cm. Possible  hematoma versus unknown etiology. Ultrasound characteristics of enlarged lymph nodes noted in the groin.      05/15/2018 Imaging   US Venous Doppler Right: No evidence of deep vein thrombosis in the lower extremity. No indirect evidence of obstruction proximal to the inguinal ligament. Left: No reflux was noted in the common femoral vein , femoral vein in the thigh, popliteal vein, great saphenous vein at the saphenofemoral junction, great saphenous vein at the proximal thigh, great saphenous vein at the mid thigh, great saphenous vein  at the distal thigh, great saphenous vein at the knee, origin of the small saphenous vein, proximal small saphenous vein, and mid small saphenous vein. There is no evidence of deep vein thrombosis in the lower extremity. There is no evidence of superficial venous thrombosis. No cystic structure found in the popliteal fossa. Unable to evaluate extension of common femoral vein obstruction proximal to the inguinal ligament.   06/01/2018 Imaging   1. 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 Additionally, there is lucency involving the medial sidewall of the left acetabulum with potential nondisplaced pathologic fracture. Further evaluation with contrast-enhanced pelvic MRI could be performed as clinically indicated. 2. The left pelvic arterial and venous system is encased by this infiltrative left pelvic sidewall mass however while difficult to ascertain, the left external iliac venous stent appears patent.    06/18/2018 Pathology Results  Lymph node for lymphoma, Left Inguinal - METASTATIC ADENOCARCINOMA, SEE COMMENT. Microscopic Comment Immunohistochemistry is positive for cytokeratin 7, PAX8, ER, and PR. Cytokeratin 5/6,and p63 are negative. The immunoprofile along with the patient's history are consistent with a gynecologic primary.   06/18/2018 Surgery   Pre-op Diagnosis: INGUINAL LYMPHADENOPATHY, PELVIC MASS     Procedure(s): EXCISIONAL BIOPSY DEEP LEFT INGUINAL LYMPH NODE  Surgeon(s): Coralie Keens, MD    06/24/2018 Cancer Staging   Staging form: Corpus Uteri - Carcinoma and Carcinosarcoma, AJCC 8th Edition - Clinical: Stage IVB (cT1a, cN2, pM1) - Signed by [name], MD on 06/24/2018    Genetic Testing   Patient has genetic testing done for MMR on pathology from 06/18/2018. Results revealed patient has the following mutation(s): MMR: abnormal   06/29/2018 Procedure   Placement of a subcutaneous port device. Catheter tip at the SVC and right atrium junction.    Genetic Testing   Patient has genetic testing done for MSI on pathology from 06/18/2018. Results revealed patient has the following mutation(s): MSI: High   07/02/2018 PET scan   Previous hysterectomy, with asymmetric focus of hypermetabolic activity in the left vaginal cuff, suspicious for residual or recurrent carcinoma.  Large hypermetabolic soft tissue mass involving the left pelvic sidewall and acetabulum, consistent with metastatic disease.  No evidence metastatic disease within the abdomen, chest, or neck.   07/09/2018 Tumor Marker   Patient's tumor was tested for the following markers: CA-125 Results of the tumor marker test revealed 9   07/10/2018 - 08/24/2018 Chemotherapy   The patient had carboplatin and taxol x 3 cycles   07/17/2018 Genetic Testing   Negative genetic testing on the common hereditary cancer panel.  The Common Hereditary Gene Panel offered by [facility] includes sequencing and/or  deletion duplication testing of the following 48 genes: APC, ATM, AXIN2, BARD1, BMPR1A, BRCA1, BRCA2, BRIP1, CDH1, CDK4, CDKN2A (p14ARF), CDKN2A (p16INK4a), CHEK2, CTNNA1, DICER1, EPCAM (Deletion/duplication testing only), GREM1 (promoter region deletion/duplication testing only), KIT, MEN1, MLH1, MSH2, MSH3, MSH6, MUTYH, NBN, NF1, NHTL1, PALB2, PDGFRA, PMS2, POLD1, POLE, PTEN, RAD50, RAD51C, RAD51D, RNF43, SDHB, SDHC, SDHD, SMAD4, SMARCA4. STK11, TP53, TSC1, TSC2, and VHL.  The following genes were evaluated for sequence changes only: SDHA and HOXB13 c.251G>A variant only. The report date is Jul 17, 2018.    10/03/2018 Imaging   CT abdomen and pelvis 1.  No acute intra-abdominal process. 2. Grossly unchanged left pelvic sidewall mass with osseous involvement of the medial acetabulum. Progressive mild displacement of the associated comminuted pathologic fracture involving the right acetabulum and puboacetabular junction.  3. New venous stents extending from the left common iliac vein origin to the proximal left common femoral vein. The stents are patent.   11/06/2018 -  Chemotherapy   The patient had pembrolizumab for chemotherapy treatment.     01/28/2019 Imaging   1. No substantial interval change in exam. 2. Interval development of mild fullness in the left intrarenal collecting system and ureter without overt hydronephrosis at this time. 3. Abnormal soft tissue along the left pelvic sidewall has decreased slightly in the interval. 4. Similar appearance of ill-defined fascial planes in the pelvis with some peritoneal thickening along the right pelvic sidewall and potentially involving the sigmoid mesocolon. 5. No substantial ascites.   05/03/2019 Imaging   1. Stable mild left pelvic sidewall soft tissue density. No new or progressive disease identified within the abdomen or pelvis.  2. Colonic diverticulosis. No radiographic evidence of diverticulitis.   Aortic Atherosclerosis  (ICD10-I70.0).  09/09/2019 Imaging   1. No change in appearance of soft tissue thickening along the LEFT pelvic sidewall adjacent to chronic LEFT acetabular fracture. 2. Mild asymmetry of the bladder wall favoring the LEFT bladder wall, not well assessed. Similar accounting for variable degrees of distension on prior studies potentially related to prior radiation, attention on follow-up. 3. Signs of venous stenting in the LEFT hemipelvis with LEFT lower extremity muscular atrophy and mild stranding with similar appearance. Signs of colonic diverticulosis and diverticular disease without change.   02/24/2020 Imaging   1. Unchanged appearance of the pelvis as detailed below. 2. Unchanged soft tissue thickening of the left pelvic sidewall. 3. 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 4. No evidence discrete mass or lymphadenopathy nor metastatic disease in the abdomen or pelvis. 5. Status post hysterectomy and cholecystectomy. 6. Left common iliac vein stent. 7. Pancolonic diverticulosis.     Metastasis to lymph nodes ([place])  06/23/2018 Initial Diagnosis   Metastasis to lymph nodes ([place])   07/10/2018 - 08/24/2018 Chemotherapy   The patient had palonosetron (ALOXI) injection 0.25 mg, 0.25 mg, Intravenous,  Once, 3 of 6 cycles Administration: 0.25 mg (07/10/2018), 0.25 mg (07/31/2018), 0.25 mg (08/24/2018) CARBOplatin (PARAPLATIN) 480 mg in sodium chloride 0.9 % 250 mL chemo infusion, 480 mg (100 % of original dose 482.5 mg), Intravenous,  Once, 3 of 6 cycles Dose modification: 482.5 mg (original dose 482.5 mg, Cycle 1) Administration: 480 mg (07/10/2018), 480 mg (07/31/2018), 480 mg (08/24/2018) PACLitaxel (TAXOL) 276 mg in sodium chloride 0.9 % 250 mL chemo infusion (> 34m/m2), 140 mg/m2 = 276 mg (80 % of original dose 175 mg/m2), Intravenous,  Once, 3 of 6 cycles Dose modification: 140 mg/m2 (80 % of original  dose 175 mg/m2, Cycle 1, Reason: Dose Not Tolerated) Administration: 276 mg (07/10/2018), 276 mg (07/31/2018), 276 mg (08/24/2018) fosaprepitant (EMEND) 150 mg, dexamethasone (DECADRON) 12 mg in sodium chloride 0.9 % 145 mL IVPB, , Intravenous,  Once, 3 of 6 cycles Administration:  (07/10/2018),  (07/31/2018),  (08/24/2018)  for chemotherapy treatment.    11/06/2018 -  Chemotherapy   The patient had pembrolizumab for chemotherapy treatment.     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  06/24/2018 Initial Diagnosis   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 [place]   07/10/2018 - 08/24/2018 Chemotherapy   The patient had palonosetron (ALOXI) injection 0.25 mg, 0.25 mg, Intravenous,  Once, 3 of 6 cycles Administration: 0.25 mg (07/10/2018), 0.25 mg (07/31/2018), 0.25 mg (08/24/2018) CARBOplatin (PARAPLATIN) 480 mg in sodium chloride 0.9 % 250 mL chemo infusion, 480 mg (100 % of original dose 482.5 mg), Intravenous,  Once, 3 of 6 cycles Dose modification: 482.5 mg (original dose 482.5 mg, Cycle 1) Administration: 480 mg (07/10/2018), 480 mg (07/31/2018), 480 mg (08/24/2018) PACLitaxel (TAXOL) 276 mg in sodium chloride 0.9 % 250 mL chemo infusion (> 85mm2), 140 mg/m2 = 276 mg (80 % of original dose 175 mg/m2), Intravenous,  Once, 3 of 6 cycles Dose modification: 140 mg/m2 (80 % of original dose 175 mg/m2, Cycle 1, Reason: Dose Not Tolerated) Administration: 276 mg (07/10/2018), 276 mg (07/31/2018), 276 mg (08/24/2018) fosaprepitant (EMEND) 150 mg, dexamethasone (DECADRON) 12 mg in sodium chloride 0.9 % 145 mL IVPB, , Intravenous,  Once, 3 of 6 cycles Administration:  (07/10/2018),  (07/31/2018),  (08/24/2018)  for chemotherapy treatment.    11/06/2018 -  Chemotherapy   The patient had pembrolizumab for chemotherapy treatment.  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       REVIEW OF SYSTEMS:   Constitutional: Denies fevers, chills or abnormal weight loss Eyes: Denies blurriness of vision Ears, nose, mouth, throat, and face: Denies mucositis or sore throat Respiratory: Denies cough, dyspnea or wheezes Cardiovascular: Denies palpitation, chest discomfort or lower extremity swelling Gastrointestinal:  Denies nausea, heartburn or change in bowel habits Skin: Denies abnormal skin rashes Lymphatics: Denies new lymphadenopathy or easy bruising Neurological:Denies numbness, tingling or new weaknesses Behavioral/Psych: Mood is stable, no new changes  All other systems were reviewed with the patient and are negative.  I have reviewed the past medical history, past surgical history, social history and family history with the patient and they are unchanged from previous note.  ALLERGIES:  has No Known Allergies.  MEDICATIONS:  Current Outpatient Medications  Medication Sig Dispense Refill  . apixaban (ELIQUIS) 2.5 MG TABS tablet Take by mouth 2 (two) times daily.    . diclofenac sodium (VOLTAREN) 1 % GEL APPLY 4GRAMS 4 TIMES A DAY AS NEEDED FOR PAINS    . gabapentin (NEURONTIN) 300 MG capsule Take 300 mg by mouth 2 (two) times daily.    . methadone (DOLOPHINE) 10 MG tablet Take 1 tablet (10 mg total) by mouth every 12 (twelve) hours. 60 tablet 0  . morphine (MSIR) 30 MG tablet Take by mouth.    . Olopatadine HCl 0.2 % SOLN Place 1 drop into both eyes daily.     No current facility-administered medications for this visit.    PHYSICAL EXAMINATION: ECOG PERFORMANCE STATUS: 1 - Symptomatic but completely ambulatory  Vitals:   05/05/20 1156  BP: 104/89  Pulse: 79  Resp: 17  Temp: 97.8 F (36.6 C)  SpO2: 99%   Filed Weights   05/05/20 1156  Weight: 206 lb 12.8 oz (93.8 kg)    GENERAL:alert, no distress and comfortable SKIN: skin color, texture, turgor are normal, no  rashes or significant lesions EYES: normal, Conjunctiva are pink and non-injected, sclera clear OROPHARYNX:no exudate, no erythema and lips, buccal mucosa, and tongue normal  NECK: supple, thyroid normal size, non-tender, without nodularity LYMPH:  no palpable lymphadenopathy in the cervical, axillary or inguinal LUNGS: clear to auscultation and percussion with normal breathing effort HEART: regular rate & rhythm and no murmurs with mild chronic bilateral lower extremity edema ABDOMEN:abdomen soft, non-tender and normal bowel sounds Musculoskeletal:no cyanosis of digits and no clubbing  NEURO: alert & oriented x 3 with fluent speech, no focal motor/sensory deficits  LABORATORY DATA:  I have reviewed the data as listed    Component Value Date/Time   NA 138 05/05/2020 1142   K 3.9 05/05/2020 1142   CL 105 05/05/2020 1142   CO2 25 05/05/2020 1142   GLUCOSE 96 05/05/2020 1142   BUN 18 05/05/2020 1142   CREATININE 0.70 05/05/2020 1142   CREATININE 0.66 12/24/2019 1023   CALCIUM 9.2 05/05/2020 1142   PROT 7.5 05/05/2020 1142   ALBUMIN 3.8 05/05/2020 1142   AST 12 (L) 05/05/2020 1142   AST 11 (L) 12/24/2019 1023   ALT 12 05/05/2020 1142   ALT 7 12/24/2019 1023   ALKPHOS 153 (H) 05/05/2020 1142   BILITOT 0.5 05/05/2020 1142   BILITOT 0.6 12/24/2019 1023   GFRNONAA >60 05/05/2020 1142   GFRNONAA >  60 12/24/2019 1023   GFRAA >60 11/12/2019 1222    No results found for: SPEP, UPEP  Lab Results  Component Value Date   WBC 4.1 05/05/2020   NEUTROABS 2.7 05/05/2020   HGB 12.2 05/05/2020   HCT 36.7 05/05/2020   MCV 90.4 05/05/2020   PLT 201 05/05/2020      Chemistry      Component Value Date/Time   NA 138 05/05/2020 1142   K 3.9 05/05/2020 1142   CL 105 05/05/2020 1142   CO2 25 05/05/2020 1142   BUN 18 05/05/2020 1142   CREATININE 0.70 05/05/2020 1142   CREATININE 0.66 12/24/2019 1023      Component Value Date/Time   CALCIUM 9.2 05/05/2020 1142   ALKPHOS 153 (H)  05/05/2020 1142   AST 12 (L) 05/05/2020 1142   AST 11 (L) 12/24/2019 1023   ALT 12 05/05/2020 1142   ALT 7 12/24/2019 1023   BILITOT 0.5 05/05/2020 1142   BILITOT 0.6 12/24/2019 1023

## 2020-05-05 NOTE — Patient Instructions (Signed)
Implanted Port Insertion, Care After This sheet gives you information about how to care for yourself after your procedure. Your health care provider may also give you more specific instructions. If you have problems or questions, contact your health care provider. What can I expect after the procedure? After the procedure, it is common to have:  Discomfort at the port insertion site.  Bruising on the skin over the port. This should improve over 3-4 days. Follow these instructions at home: Port care  After your port is placed, you will get a manufacturer's information card. The card has information about your port. Keep this card with you at all times.  Take care of the port as told by your health care provider. Ask your health care provider if you or a family member can get training for taking care of the port at home. A home health care nurse may also take care of the port.  Make sure to remember what type of port you have. Incision care  Follow instructions from your health care provider about how to take care of your port insertion site. Make sure you: ? Wash your hands with soap and water before and after you change your bandage (dressing). If soap and water are not available, use hand sanitizer. ? Change your dressing as told by your health care provider. ? Leave stitches (sutures), skin glue, or adhesive strips in place. These skin closures may need to stay in place for 2 weeks or longer. If adhesive strip edges start to loosen and curl up, you may trim the loose edges. Do not remove adhesive strips completely unless your health care provider tells you to do that.  Check your port insertion site every day for signs of infection. Check for: ? Redness, swelling, or pain. ? Fluid or blood. ? Warmth. ? Pus or a bad smell.      Activity  Return to your normal activities as told by your health care provider. Ask your health care provider what activities are safe for you.  Do not  lift anything that is heavier than 10 lb (4.5 kg), or the limit that you are told, until your health care provider says that it is safe. General instructions  Take over-the-counter and prescription medicines only as told by your health care provider.  Do not take baths, swim, or use a hot tub until your health care provider approves. Ask your health care provider if you may take showers. You may only be allowed to take sponge baths.  Do not drive for 24 hours if you were given a sedative during your procedure.  Wear a medical alert bracelet in case of an emergency. This will tell any health care providers that you have a port.  Keep all follow-up visits as told by your health care provider. This is important. Contact a health care provider if:  You cannot flush your port with saline as directed, or you cannot draw blood from the port.  You have a fever or chills.  You have redness, swelling, or pain around your port insertion site.  You have fluid or blood coming from your port insertion site.  Your port insertion site feels warm to the touch.  You have pus or a bad smell coming from the port insertion site. Get help right away if:  You have chest pain or shortness of breath.  You have bleeding from your port that you cannot control. Summary  Take care of the port as told by your   health care provider. Keep the manufacturer's information card with you at all times.  Change your dressing as told by your health care provider.  Contact a health care provider if you have a fever or chills or if you have redness, swelling, or pain around your port insertion site.  Keep all follow-up visits as told by your health care provider. This information is not intended to replace advice given to you by your health care provider. Make sure you discuss any questions you have with your health care provider. Document Revised: 09/02/2017 Document Reviewed: 09/02/2017 [employer] Patient Education   2021 [employer]  

## 2020-05-26 ENCOUNTER — Encounter: Payer: Self-pay | Admitting: Hematology and Oncology

## 2020-05-26 ENCOUNTER — Inpatient Hospital Stay: Payer: Medicare Other

## 2020-05-26 ENCOUNTER — Other Ambulatory Visit: Payer: Self-pay

## 2020-05-26 ENCOUNTER — Telehealth: Payer: Self-pay | Admitting: Hematology and Oncology

## 2020-05-26 ENCOUNTER — Inpatient Hospital Stay (HOSPITAL_BASED_OUTPATIENT_CLINIC_OR_DEPARTMENT_OTHER): Payer: Medicare Other | Admitting: Hematology and Oncology

## 2020-05-26 ENCOUNTER — Inpatient Hospital Stay: Payer: Medicare Other | Attending: Hematology and Oncology

## 2020-05-26 DIAGNOSIS — M7989 Other specified soft tissue disorders: Secondary | ICD-10-CM

## 2020-05-26 DIAGNOSIS — C541 Malignant neoplasm of endometrium: Secondary | ICD-10-CM | POA: Insufficient documentation

## 2020-05-26 DIAGNOSIS — C55 Malignant neoplasm of uterus, part unspecified: Secondary | ICD-10-CM

## 2020-05-26 DIAGNOSIS — G893 Neoplasm related pain (acute) (chronic): Secondary | ICD-10-CM | POA: Diagnosis not present

## 2020-05-26 DIAGNOSIS — R238 Other skin changes: Secondary | ICD-10-CM

## 2020-05-26 DIAGNOSIS — Z7189 Other specified counseling: Secondary | ICD-10-CM

## 2020-05-26 DIAGNOSIS — Z7901 Long term (current) use of anticoagulants: Secondary | ICD-10-CM | POA: Diagnosis not present

## 2020-05-26 DIAGNOSIS — L03116 Cellulitis of left lower limb: Secondary | ICD-10-CM | POA: Diagnosis not present

## 2020-05-26 DIAGNOSIS — K5730 Diverticulosis of large intestine without perforation or abscess without bleeding: Secondary | ICD-10-CM | POA: Diagnosis not present

## 2020-05-26 DIAGNOSIS — C7951 Secondary malignant neoplasm of bone: Secondary | ICD-10-CM

## 2020-05-26 DIAGNOSIS — Z79899 Other long term (current) drug therapy: Secondary | ICD-10-CM | POA: Diagnosis not present

## 2020-05-26 DIAGNOSIS — Z5112 Encounter for antineoplastic immunotherapy: Secondary | ICD-10-CM | POA: Diagnosis not present

## 2020-05-26 DIAGNOSIS — E039 Hypothyroidism, unspecified: Secondary | ICD-10-CM

## 2020-05-26 DIAGNOSIS — Z86718 Personal history of other venous thrombosis and embolism: Secondary | ICD-10-CM | POA: Diagnosis not present

## 2020-05-26 DIAGNOSIS — C801 Malignant (primary) neoplasm, unspecified: Secondary | ICD-10-CM

## 2020-05-26 DIAGNOSIS — C774 Secondary and unspecified malignant neoplasm of inguinal and lower limb lymph nodes: Secondary | ICD-10-CM

## 2020-05-26 LAB — COMPREHENSIVE METABOLIC PANEL
ALT: 11 U/L (ref 0–44)
AST: 12 U/L — ABNORMAL LOW (ref 15–41)
Albumin: 3.7 g/dL (ref 3.5–5.0)
Alkaline Phosphatase: 140 U/L — ABNORMAL HIGH (ref 38–126)
Anion gap: 12 (ref 5–15)
BUN: 18 mg/dL (ref 8–23)
CO2: 23 mmol/L (ref 22–32)
Calcium: 8.9 mg/dL (ref 8.9–10.3)
Chloride: 106 mmol/L (ref 98–111)
Creatinine, Ser: 0.72 mg/dL (ref 0.44–1.00)
GFR, Estimated: >60 mL/min (ref >60)
Glucose, Bld: 94 mg/dL (ref 70–99)
Potassium: 4.1 mmol/L (ref 3.5–5.1)
Sodium: 141 mmol/L (ref 135–145)
Total Bilirubin: 0.6 mg/dL (ref 0.3–1.2)
Total Protein: 7.3 g/dL (ref 6.5–8.1)

## 2020-05-26 LAB — CBC WITH DIFFERENTIAL/PLATELET
Abs Immature Granulocytes: 0.02 10*3/uL (ref 0.00–0.07)
Basophils Absolute: 0 10*3/uL (ref 0.0–0.1)
Basophils Relative: 1 %
Eosinophils Absolute: 0.1 10*3/uL (ref 0.0–0.5)
Eosinophils Relative: 1 %
HCT: 36.8 % (ref 36.0–46.0)
Hemoglobin: 12 g/dL (ref 12.0–15.0)
Immature Granulocytes: 1 %
Lymphocytes Relative: 21 %
Lymphs Abs: 0.9 10*3/uL (ref 0.7–4.0)
MCH: 29.9 pg (ref 26.0–34.0)
MCHC: 32.6 g/dL (ref 30.0–36.0)
MCV: 91.5 fL (ref 80.0–100.0)
Monocytes Absolute: 0.4 10*3/uL (ref 0.1–1.0)
Monocytes Relative: 10 %
Neutro Abs: 2.8 10*3/uL (ref 1.7–7.7)
Neutrophils Relative %: 66 %
Platelets: 217 10*3/uL (ref 150–400)
RBC: 4.02 MIL/uL (ref 3.87–5.11)
RDW: 13.7 % (ref 11.5–15.5)
WBC: 4.3 10*3/uL (ref 4.0–10.5)
nRBC: 0 % (ref 0.0–0.2)

## 2020-05-26 LAB — TSH: TSH: 1.575 u[IU]/mL (ref 0.308–3.960)

## 2020-05-26 MED ORDER — SODIUM CHLORIDE 0.9% FLUSH
10.0000 mL | INTRAVENOUS | Status: DC | PRN
  Administered 2020-05-26: 10 mL
  Filled 2020-05-26: qty 10

## 2020-05-26 MED ORDER — HEPARIN SOD (PORK) LOCK FLUSH 100 UNIT/ML IV SOLN
500.0000 [IU] | Freq: Once | INTRAVENOUS | Status: AC | PRN
Start: 2020-05-26 — End: 2020-05-26
  Administered 2020-05-26: 500 [IU]
  Filled 2020-05-26: qty 5

## 2020-05-26 MED ORDER — SODIUM CHLORIDE 0.9% FLUSH
10.0000 mL | Freq: Once | INTRAVENOUS | Status: AC
  Administered 2020-05-26: 10 mL
  Filled 2020-05-26: qty 10

## 2020-05-26 MED ORDER — SODIUM CHLORIDE 0.9 % IV SOLN
200.0000 mg | Freq: Once | INTRAVENOUS | Status: AC
  Administered 2020-05-26: 200 mg via INTRAVENOUS
  Filled 2020-05-26: qty 8

## 2020-05-26 MED ORDER — SODIUM CHLORIDE 0.9 % IV SOLN
Freq: Once | INTRAVENOUS | Status: AC
  Filled 2020-05-26: qty 250

## 2020-05-26 MED ORDER — MORPHINE SULFATE 15 MG PO TABS: 15.0000 mg | ORAL_TABLET | Freq: Four times a day (QID) | ORAL | 0 refills | Status: DC | PRN

## 2020-05-26 NOTE — Patient Instructions (Signed)
[facility] Discharge Instructions for Patients Receiving Chemotherapy  Today you received the following chemotherapy agents:  Keytruda.  To help prevent nausea and vomiting after your treatment, we encourage you to take your nausea medication as directed.   If you develop nausea and vomiting that is not controlled by your nausea medication, call the clinic.   BELOW ARE SYMPTOMS THAT SHOULD BE REPORTED IMMEDIATELY:  *FEVER GREATER THAN 100.5 F  *CHILLS WITH OR WITHOUT FEVER  NAUSEA AND VOMITING THAT IS NOT CONTROLLED WITH YOUR NAUSEA MEDICATION  *UNUSUAL SHORTNESS OF BREATH  *UNUSUAL BRUISING OR BLEEDING  TENDERNESS IN MOUTH AND THROAT WITH OR WITHOUT PRESENCE OF ULCERS  *URINARY PROBLEMS  *BOWEL PROBLEMS  UNUSUAL RASH Items with * indicate a potential emergency and should be followed up as soon as possible.  Feel free to call the clinic should you have any questions or concerns. The clinic phone number is [phone].  Please show the CHEMO ALERT CARD at check-in to the Emergency Department and triage nurse.    

## 2020-05-26 NOTE — Assessment & Plan Note (Signed)
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

## 2020-05-26 NOTE — Assessment & Plan Note (Addendum)
She has chronic leg swelling on the left leg, unchanged The redness on her leg does not resemble cellulitis I recommend she consult with her vascular surgeon for evaluation She will continue anticoagulation therapy

## 2020-05-26 NOTE — Telephone Encounter (Signed)
Scheduled follow-up appointment per 4/8 schedule message. Patient is aware.

## 2020-05-26 NOTE — Assessment & Plan Note (Signed)
She has less pain Plan to reduce the dose of morphine sulfate

## 2020-05-26 NOTE — Progress Notes (Signed)
[place] OFFICE PROGRESS NOTE  Patient Care Team: Nolene Ebbs, MD as PCP - General (Internal Medicine)  ASSESSMENT & PLAN:  Uterine cancer [facility]) Per previous discussion, the plan would be to continue pembrolizumab indefinitely I do not plan to repeat imaging study again for few months, probably around July  Cancer associated pain She has less pain Plan to reduce the dose of morphine sulfate  Redness and swelling of lower leg She has chronic leg swelling on the left leg, unchanged The redness on her leg does not resemble cellulitis I recommend she consult with her vascular surgeon for evaluation She will continue anticoagulation therapy   No orders of the defined types were placed in this encounter.   All questions were answered. The patient knows to call the clinic with any problems, questions or concerns. The total time spent in the appointment was 20 minutes encounter with patients including review of chart and various tests results, discussions about plan of care and coordination of care plan   [name], MD 05/26/2020 1:08 PM  INTERVAL HISTORY: Please see below for problem oriented charting. She returns for treatment and follow-up Her chronic pain is stable On average, she takes morphine 3 times a week She noticed some redness on the left leg Denies recent fever or chills Denies itchiness or pain The patient denies any recent signs or symptoms of bleeding such as spontaneous epistaxis, hematuria or hematochezia.   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 Uterus +/- tubes/ovaries, neoplastic, with left fallopian tube and ovary - INVASIVE ENDOMETRIOID CARCINOMA (1.5 CM), FIGO GRADE II, ARISING IN A BACKGROUND OF ATYPICAL COMPLEX  HYPERPLASIA, CONFINED WITHIN INNER HALF OF THE MYOMETRIUM. - ENDOMETRIAL POLYP WITH ASSOCIATED ATYPICAL COMPLEX HYPERPLASIA. - MYOMETRIUM: LEIOMYOMATA. - CERVIX: BENIGN SQUAMOUS MUCOSA AND ENDOCERVICAL MUCOSA, NO DYSPLASIA OR MALIGNANCY. - LEFT OVARY: BENIGN OVARIAN TISSUE WITH ENDOSALPINGOSIS, NO EVIDENCE OF ATYPIA OR MALIGNANCY. - LEFT FALLOPIAN TUBE: NO HISTOLOGIC ABNORMALITIES. - PLEASE SEE ONCOLOGY TEMPLATE FOR DETAIL. 2. Ovary and fallopian tube, right - BENIGN OVARIAN TISSUE WITH ENDOSALPINGOSIS, NO ATYPIA OR MALIGNANCY. - BENIGN FALLOPIAN TUBAL TISSUE, NO PATHOLOGIC ABNORMALITIES. Microscopic Comment 1. UTERUS Specimen: Uterus, cervix, bilateral ovaries and fallopian tubes Procedure: Total hysterectomy and bilateral salpingo-oophorectomy Lymph node sampling performed: No Specimen integrity: Intact Maximum tumor size (cm): 1.5 cm, glass slide measurement Histologic type: Invasive endometrioid carcinoma Grade: FIGO grade II Myometrial invasion: 1 cm where myometrium is 2.3 cm in thickness Cervical stromal involvement: No Extent of involvement of other organs: No Lymph vascular invasion: Not identified Peritoneal washings: Negative ([id number]) Lymph nodes: number examined N/A; number positive N/A TNM code: pT1a, pNX 1 oFf 3IGO Stage (based on pathologic findings, needs clinical correlation): IA  Comments: Sections the endomyometrium away from the grossly identified endometrial polyp show an invasive FIGO grade II endometrioid carcinoma. The tumor is confined within inner half of the myometrium. No angiolymphatic invasion is identified. No cervical stromal involvement is identified. Sections of the grossly identified endometrial polyp show an endometrial polyp with associated atypical compacted hyperplasia with no definitive evidence of carcinoma.   12/07/2017 Imaging   US venous Doppler Right: No evidence of common femoral vein obstruction. Left: Findings consistent with acute  deep vein thrombosis involving the left femoral vein, left proximal profunda vein, and left popliteal  vein. Unable to adequately interrogate the common femoral and higher, or the calf secondary to significant edema and body habitus   12/07/2017 [facility] Admission   She presented to the ER and was diagnosed with acute DVT   01/18/2018 - 01/21/2018 Hospital Admission   She was admitted to the hospital for management of severe persistent DVT   01/18/2018 Imaging   US venous Doppler Right: No evidence of common femoral vein obstruction. Left: Findings consistent with acute deep vein thrombosis involving the left common femoral vein, and left popliteal vein.   01/19/2018 Surgery   Pre-operative Diagnosis: Subacute DVT with severe post thrombotic syndrome Post-operative diagnosis:  Same Surgeon:  Erlene Quan C. Donzetta Matters, MD Procedure Performed: 1.  Ultrasound-guided cannulation left small saphenous vein 2.  Left lower extremity and central venography 3.  Intravascular ultrasound of left popliteal, femoral, common femoral, external and common iliac veins and IVC 4.  Stent of left common and external iliac veins with 14 x 60 mm Vici 5.  Moderate sedation with fentanyl and Versed for 50 minutes  Indications: 75 year old female with a history of DVT in October now presents with persistent left lower extremity swelling and ultrasound demonstrating likely persistent DVT.  She has been on Xarelto at this time.  She is now indicated for venogram possible intervention.  Findings: Flow in the left lower extremity was stagnant throughout but by venogram all veins were patent.  There was a focal occlusive area approximately 2 cm in length at the common and external iliac vein junction at the hypogastric on the left.  After stenting and ballooning we had a diameter of 12 millimeters in the stent and venogram demonstrated flow in the lower extremity veins were previously was stagnant and no further residual stenosis  in the left common and external iliac vein junction.   04/12/2018 Imaging   US Venous Doppler Right: No evidence of common femoral vein obstruction. Left: There is no evidence of deep vein thrombosis in the lower extremity. However, portions of this examination were limited- see technologist comments above. Left groin: Large hypoechoic area with mixed echoes noted measuring nearly 10 cm. Possible  hematoma versus unknown etiology. Ultrasound characteristics of enlarged lymph nodes noted in the groin.      05/15/2018 Imaging   US Venous Doppler Right: No evidence of deep vein thrombosis in the lower extremity. No indirect evidence of obstruction proximal to the inguinal ligament. Left: No reflux was noted in the common femoral vein , femoral vein in the thigh, popliteal vein, great saphenous vein at the saphenofemoral junction, great saphenous vein at the proximal thigh, great saphenous vein at the mid thigh, great saphenous vein  at the distal thigh, great saphenous vein at the knee, origin of the small saphenous vein, proximal small saphenous vein, and mid small saphenous vein. There is no evidence of deep vein thrombosis in the lower extremity. There is no evidence of superficial venous thrombosis. No cystic structure found in the popliteal fossa. Unable to evaluate extension of common femoral vein obstruction proximal to the inguinal ligament.   06/01/2018 Imaging   1. 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 Additionally, there is lucency involving the medial sidewall of the left acetabulum with potential nondisplaced pathologic fracture. Further evaluation with contrast-enhanced pelvic MRI could be performed as clinically indicated. 2. The left pelvic arterial and venous system is encased by this infiltrative left pelvic sidewall mass however while  difficult to ascertain, the left external  iliac venous stent appears patent.   06/18/2018 Pathology Results   Lymph node for lymphoma, Left Inguinal - METASTATIC ADENOCARCINOMA, SEE COMMENT. Microscopic Comment Immunohistochemistry is positive for cytokeratin 7, PAX8, ER, and PR. Cytokeratin 5/6,and p63 are negative. The immunoprofile along with the patient's history are consistent with a gynecologic primary.   06/18/2018 Surgery   Pre-op Diagnosis: INGUINAL LYMPHADENOPATHY, PELVIC MASS     Procedure(s): EXCISIONAL BIOPSY DEEP LEFT INGUINAL LYMPH NODE  Surgeon(s): Coralie Keens, MD    06/24/2018 Cancer Staging   Staging form: Corpus Uteri - Carcinoma and Carcinosarcoma, AJCC 8th Edition - Clinical: Stage IVB (cT1a, cN2, pM1) - Signed by [name], MD on 06/24/2018    Genetic Testing   Patient has genetic testing done for MMR on pathology from 06/18/2018. Results revealed patient has the following mutation(s): MMR: abnormal   06/29/2018 Procedure   Placement of a subcutaneous port device. Catheter tip at the SVC and right atrium junction.    Genetic Testing   Patient has genetic testing done for MSI on pathology from 06/18/2018. Results revealed patient has the following mutation(s): MSI: High   07/02/2018 PET scan   Previous hysterectomy, with asymmetric focus of hypermetabolic activity in the left vaginal cuff, suspicious for residual or recurrent carcinoma.  Large hypermetabolic soft tissue mass involving the left pelvic sidewall and acetabulum, consistent with metastatic disease.  No evidence metastatic disease within the abdomen, chest, or neck.   07/09/2018 Tumor Marker   Patient's tumor was tested for the following markers: CA-125 Results of the tumor marker test revealed 9   07/10/2018 - 08/24/2018 Chemotherapy   The patient had carboplatin and taxol x 3 cycles   07/17/2018 Genetic Testing   Negative genetic testing on the common hereditary cancer panel.  The Common Hereditary Gene Panel offered by  [facility] includes sequencing and/or deletion duplication testing of the following 48 genes: APC, ATM, AXIN2, BARD1, BMPR1A, BRCA1, BRCA2, BRIP1, CDH1, CDK4, CDKN2A (p14ARF), CDKN2A (p16INK4a), CHEK2, CTNNA1, DICER1, EPCAM (Deletion/duplication testing only), GREM1 (promoter region deletion/duplication testing only), KIT, MEN1, MLH1, MSH2, MSH3, MSH6, MUTYH, NBN, NF1, NHTL1, PALB2, PDGFRA, PMS2, POLD1, POLE, PTEN, RAD50, RAD51C, RAD51D, RNF43, SDHB, SDHC, SDHD, SMAD4, SMARCA4. STK11, TP53, TSC1, TSC2, and VHL.  The following genes were evaluated for sequence changes only: SDHA and HOXB13 c.251G>A variant only. The report date is Jul 17, 2018.    10/03/2018 Imaging   CT abdomen and pelvis 1.  No acute intra-abdominal process. 2. Grossly unchanged left pelvic sidewall mass with osseous involvement of the medial acetabulum. Progressive mild displacement of the associated comminuted pathologic fracture involving the right acetabulum and puboacetabular junction.  3. New venous stents extending from the left common iliac vein origin to the proximal left common femoral vein. The stents are patent.   11/06/2018 -  Chemotherapy   The patient had pembrolizumab for chemotherapy treatment.     01/28/2019 Imaging   1. No substantial interval change in exam. 2. Interval development of mild fullness in the left intrarenal collecting system and ureter without overt hydronephrosis at this time. 3. Abnormal soft tissue along the left pelvic sidewall has decreased slightly in the interval. 4. Similar appearance of ill-defined fascial planes in the pelvis with some peritoneal thickening along the right pelvic sidewall and potentially involving the sigmoid mesocolon. 5. No substantial ascites.   05/03/2019 Imaging   1. Stable mild left pelvic sidewall soft tissue density. No new or progressive disease identified within the abdomen  or pelvis.  2. Colonic diverticulosis. No radiographic evidence of diverticulitis.    Aortic Atherosclerosis (ICD10-I70.0).   09/09/2019 Imaging   1. No change in appearance of soft tissue thickening along the LEFT pelvic sidewall adjacent to chronic LEFT acetabular fracture. 2. Mild asymmetry of the bladder wall favoring the LEFT bladder wall, not well assessed. Similar accounting for variable degrees of distension on prior studies potentially related to prior radiation, attention on follow-up. 3. Signs of venous stenting in the LEFT hemipelvis with LEFT lower extremity muscular atrophy and mild stranding with similar appearance. Signs of colonic diverticulosis and diverticular disease without change.   02/24/2020 Imaging   1. Unchanged appearance of the pelvis as detailed below. 2. Unchanged soft tissue thickening of the left pelvic sidewall. 3. 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 4. No evidence discrete mass or lymphadenopathy nor metastatic disease in the abdomen or pelvis. 5. Status post hysterectomy and cholecystectomy. 6. Left common iliac vein stent. 7. Pancolonic diverticulosis.     Metastasis to lymph nodes ([place])  06/23/2018 Initial Diagnosis   Metastasis to lymph nodes ([place])   07/10/2018 - 08/24/2018 Chemotherapy   The patient had palonosetron (ALOXI) injection 0.25 mg, 0.25 mg, Intravenous,  Once, 3 of 6 cycles Administration: 0.25 mg (07/10/2018), 0.25 mg (07/31/2018), 0.25 mg (08/24/2018) CARBOplatin (PARAPLATIN) 480 mg in sodium chloride 0.9 % 250 mL chemo infusion, 480 mg (100 % of original dose 482.5 mg), Intravenous,  Once, 3 of 6 cycles Dose modification: 482.5 mg (original dose 482.5 mg, Cycle 1) Administration: 480 mg (07/10/2018), 480 mg (07/31/2018), 480 mg (08/24/2018) PACLitaxel (TAXOL) 276 mg in sodium chloride 0.9 % 250 mL chemo infusion (> 56m/m2), 140 mg/m2 = 276 mg (80 % of original dose 175 mg/m2), Intravenous,  Once, 3 of 6 cycles Dose modification: 140  mg/m2 (80 % of original dose 175 mg/m2, Cycle 1, Reason: Dose Not Tolerated) Administration: 276 mg (07/10/2018), 276 mg (07/31/2018), 276 mg (08/24/2018) fosaprepitant (EMEND) 150 mg, dexamethasone (DECADRON) 12 mg in sodium chloride 0.9 % 145 mL IVPB, , Intravenous,  Once, 3 of 6 cycles Administration:  (07/10/2018),  (07/31/2018),  (08/24/2018)  for chemotherapy treatment.    11/06/2018 -  Chemotherapy   The patient had pembrolizumab for chemotherapy treatment.     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  06/24/2018 Initial Diagnosis   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 [place]   07/10/2018 - 08/24/2018 Chemotherapy   The patient had palonosetron (ALOXI) injection 0.25 mg, 0.25 mg, Intravenous,  Once, 3 of 6 cycles Administration: 0.25 mg (07/10/2018), 0.25 mg (07/31/2018), 0.25 mg (08/24/2018) CARBOplatin (PARAPLATIN) 480 mg in sodium chloride 0.9 % 250 mL chemo infusion, 480 mg (100 % of original dose 482.5 mg), Intravenous,  Once, 3 of 6 cycles Dose modification: 482.5 mg (original dose 482.5 mg, Cycle 1) Administration: 480 mg (07/10/2018), 480 mg (07/31/2018), 480 mg (08/24/2018) PACLitaxel (TAXOL) 276 mg in sodium chloride 0.9 % 250 mL chemo infusion (> 850mm2), 140 mg/m2 = 276 mg (80 % of original dose 175 mg/m2), Intravenous,  Once, 3 of 6 cycles Dose modification: 140 mg/m2 (80 % of original dose 175 mg/m2, Cycle 1, Reason: Dose Not Tolerated) Administration: 276 mg (07/10/2018), 276 mg (07/31/2018), 276 mg (08/24/2018) fosaprepitant (EMEND) 150 mg, dexamethasone (DECADRON) 12 mg in sodium chloride 0.9 % 145 mL IVPB, , Intravenous,  Once, 3 of 6 cycles Administration:  (07/10/2018),  (07/31/2018),  (08/24/2018)  for chemotherapy treatment.  11/06/2018 -  Chemotherapy   The patient had pembrolizumab for chemotherapy treatment.     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       REVIEW OF SYSTEMS:   Constitutional: Denies fevers, chills or abnormal weight loss Eyes: Denies blurriness of vision Ears, nose, mouth, throat, and face: Denies mucositis or sore throat Respiratory: Denies cough, dyspnea or wheezes Cardiovascular: Denies palpitation, chest discomfort or lower extremity swelling Gastrointestinal:  Denies nausea, heartburn or change in bowel habits Lymphatics: Denies new lymphadenopathy or easy bruising Neurological:Denies numbness, tingling or new weaknesses Behavioral/Psych: Mood is stable, no new changes  All other systems were reviewed with the patient and are negative.  I have reviewed the past medical history, past surgical history, social history and family history with the patient and they are unchanged from previous note.  ALLERGIES:  has No Known Allergies.  MEDICATIONS:  Current Outpatient Medications  Medication Sig Dispense Refill  . apixaban (ELIQUIS) 2.5 MG TABS tablet Take by mouth 2 (two) times daily.    . diclofenac sodium (VOLTAREN) 1 % GEL APPLY 4GRAMS 4 TIMES A DAY AS NEEDED FOR PAINS    . gabapentin (NEURONTIN) 300 MG capsule Take 300 mg by mouth 2 (two) times daily.    . methadone (DOLOPHINE) 10 MG tablet Take 1 tablet (10 mg total) by mouth every 12 (twelve) hours. 60 tablet 0  . morphine (MSIR) 15 MG tablet Take 1 tablet (15 mg total) by mouth every 6 (six) hours as needed for severe pain. 60 tablet 0  . Olopatadine HCl 0.2 % SOLN Place 1 drop into both eyes daily.     No current facility-administered medications for this visit.   Facility-Administered Medications Ordered in Other Visits  Medication Dose Route Frequency Provider Last Rate Last Admin  . heparin lock flush 100 unit/mL  500 Units Intracatheter Once PRN Alvy Bimler, Jalin Erpelding, MD      . pembrolizumab (KEYTRUDA) 200 mg in sodium chloride 0.9 % 50 mL chemo infusion  200 mg  Intravenous Once Betzabeth Derringer, MD      . sodium chloride flush (NS) 0.9 % injection 10 mL  10 mL Intracatheter PRN Alvy Bimler, Emberlynn Riggan, MD        PHYSICAL EXAMINATION: ECOG PERFORMANCE STATUS: 2 - Symptomatic, <50% confined to bed  Vitals:   05/26/20 1154  BP: (!) 126/59  Pulse: 72  Resp: 17  Temp: (!) 97 F (36.1 C)  SpO2: 100%   Filed Weights   05/26/20 1154  Weight: 214 lb 12.8 oz (97.4 kg)    GENERAL:alert, no distress and comfortable SKIN: The skin on her left leg is erythematous but not consistent with cellulitis EYES: normal, Conjunctiva are pink and non-injected, sclera clear OROPHARYNX:no exudate, no erythema and lips, buccal mucosa, and tongue normal  NECK: supple, thyroid normal size, non-tender, without nodularity LYMPH:  no palpable lymphadenopathy in the cervical, axillary or inguinal LUNGS: clear to auscultation and percussion with normal breathing effort HEART: regular rate & rhythm and no murmurs with bilateral lower extremity edema, left greater than right ABDOMEN:abdomen soft, non-tender and normal bowel sounds Musculoskeletal:no cyanosis of digits and no clubbing  NEURO: alert & oriented x 3 with fluent speech, no focal motor/sensory deficits  LABORATORY DATA:  I have reviewed the data as listed    Component  Value Date/Time   NA 141 05/26/2020 1146   K 4.1 05/26/2020 1146   CL 106 05/26/2020 1146   CO2 23 05/26/2020 1146   GLUCOSE 94 05/26/2020 1146   BUN 18 05/26/2020 1146   CREATININE 0.72 05/26/2020 1146   CREATININE 0.66 12/24/2019 1023   CALCIUM 8.9 05/26/2020 1146   PROT 7.3 05/26/2020 1146   ALBUMIN 3.7 05/26/2020 1146   AST 12 (L) 05/26/2020 1146   AST 11 (L) 12/24/2019 1023   ALT 11 05/26/2020 1146   ALT 7 12/24/2019 1023   ALKPHOS 140 (H) 05/26/2020 1146   BILITOT 0.6 05/26/2020 1146   BILITOT 0.6 12/24/2019 1023   GFRNONAA >60 05/26/2020 1146   GFRNONAA >60 12/24/2019 1023   GFRAA >60 11/12/2019 1222    No results found for: SPEP,  UPEP  Lab Results  Component Value Date   WBC 4.3 05/26/2020   NEUTROABS 2.8 05/26/2020   HGB 12.0 05/26/2020   HCT 36.8 05/26/2020   MCV 91.5 05/26/2020   PLT 217 05/26/2020      Chemistry      Component Value Date/Time   NA 141 05/26/2020 1146   K 4.1 05/26/2020 1146   CL 106 05/26/2020 1146   CO2 23 05/26/2020 1146   BUN 18 05/26/2020 1146   CREATININE 0.72 05/26/2020 1146   CREATININE 0.66 12/24/2019 1023      Component Value Date/Time   CALCIUM 8.9 05/26/2020 1146   ALKPHOS 140 (H) 05/26/2020 1146   AST 12 (L) 05/26/2020 1146   AST 11 (L) 12/24/2019 1023   ALT 11 05/26/2020 1146   ALT 7 12/24/2019 1023   BILITOT 0.6 05/26/2020 1146   BILITOT 0.6 12/24/2019 1023

## 2020-05-26 NOTE — Patient Instructions (Signed)
Implanted Port Insertion, Care After This sheet gives you information about how to care for yourself after your procedure. Your health care provider may also give you more specific instructions. If you have problems or questions, contact your health care provider. What can I expect after the procedure? After the procedure, it is common to have:  Discomfort at the port insertion site.  Bruising on the skin over the port. This should improve over 3-4 days. Follow these instructions at home: Port care  After your port is placed, you will get a manufacturer's information card. The card has information about your port. Keep this card with you at all times.  Take care of the port as told by your health care provider. Ask your health care provider if you or a family member can get training for taking care of the port at home. A home health care nurse may also take care of the port.  Make sure to remember what type of port you have. Incision care  Follow instructions from your health care provider about how to take care of your port insertion site. Make sure you: ? Wash your hands with soap and water before and after you change your bandage (dressing). If soap and water are not available, use hand sanitizer. ? Change your dressing as told by your health care provider. ? Leave stitches (sutures), skin glue, or adhesive strips in place. These skin closures may need to stay in place for 2 weeks or longer. If adhesive strip edges start to loosen and curl up, you may trim the loose edges. Do not remove adhesive strips completely unless your health care provider tells you to do that.  Check your port insertion site every day for signs of infection. Check for: ? Redness, swelling, or pain. ? Fluid or blood. ? Warmth. ? Pus or a bad smell.      Activity  Return to your normal activities as told by your health care provider. Ask your health care provider what activities are safe for you.  Do not  lift anything that is heavier than 10 lb (4.5 kg), or the limit that you are told, until your health care provider says that it is safe. General instructions  Take over-the-counter and prescription medicines only as told by your health care provider.  Do not take baths, swim, or use a hot tub until your health care provider approves. Ask your health care provider if you may take showers. You may only be allowed to take sponge baths.  Do not drive for 24 hours if you were given a sedative during your procedure.  Wear a medical alert bracelet in case of an emergency. This will tell any health care providers that you have a port.  Keep all follow-up visits as told by your health care provider. This is important. Contact a health care provider if:  You cannot flush your port with saline as directed, or you cannot draw blood from the port.  You have a fever or chills.  You have redness, swelling, or pain around your port insertion site.  You have fluid or blood coming from your port insertion site.  Your port insertion site feels warm to the touch.  You have pus or a bad smell coming from the port insertion site. Get help right away if:  You have chest pain or shortness of breath.  You have bleeding from your port that you cannot control. Summary  Take care of the port as told by your   health care provider. Keep the manufacturer's information card with you at all times.  Change your dressing as told by your health care provider.  Contact a health care provider if you have a fever or chills or if you have redness, swelling, or pain around your port insertion site.  Keep all follow-up visits as told by your health care provider. This information is not intended to replace advice given to you by your health care provider. Make sure you discuss any questions you have with your health care provider. Document Revised: 09/02/2017 Document Reviewed: 09/02/2017 [employer] Patient Education   2021 [employer]  

## 2020-06-14 ENCOUNTER — Emergency Department (HOSPITAL_BASED_OUTPATIENT_CLINIC_OR_DEPARTMENT_OTHER): Admit: 2020-06-14 | Discharge: 2020-06-14 | Disposition: A | Discharge status: Home / Self Care | Payer: Medicare Other

## 2020-06-14 ENCOUNTER — Emergency Department (HOSPITAL_COMMUNITY)
Admission: EM | Admit: 2020-06-14 | Discharge: 2020-06-15 | Disposition: A | Discharge status: Home / Self Care | Payer: Medicare Other | Attending: Emergency Medicine | Admitting: Emergency Medicine

## 2020-06-14 ENCOUNTER — Telehealth: Payer: Self-pay

## 2020-06-14 DIAGNOSIS — Z86718 Personal history of other venous thrombosis and embolism: Secondary | ICD-10-CM | POA: Diagnosis not present

## 2020-06-14 DIAGNOSIS — Z8542 Personal history of malignant neoplasm of other parts of uterus: Secondary | ICD-10-CM | POA: Insufficient documentation

## 2020-06-14 DIAGNOSIS — Z7901 Long term (current) use of anticoagulants: Secondary | ICD-10-CM | POA: Insufficient documentation

## 2020-06-14 DIAGNOSIS — L03116 Cellulitis of left lower limb: Secondary | ICD-10-CM | POA: Diagnosis not present

## 2020-06-14 DIAGNOSIS — Z87891 Personal history of nicotine dependence: Secondary | ICD-10-CM | POA: Diagnosis not present

## 2020-06-14 DIAGNOSIS — Z96653 Presence of artificial knee joint, bilateral: Secondary | ICD-10-CM | POA: Insufficient documentation

## 2020-06-14 DIAGNOSIS — L538 Other specified erythematous conditions: Secondary | ICD-10-CM | POA: Diagnosis not present

## 2020-06-14 DIAGNOSIS — R609 Edema, unspecified: Secondary | ICD-10-CM

## 2020-06-14 DIAGNOSIS — R2242 Localized swelling, mass and lump, left lower limb: Secondary | ICD-10-CM | POA: Diagnosis present

## 2020-06-14 DIAGNOSIS — I10 Essential (primary) hypertension: Secondary | ICD-10-CM | POA: Insufficient documentation

## 2020-06-14 DIAGNOSIS — I82403 Acute embolism and thrombosis of unspecified deep veins of lower extremity, bilateral: Secondary | ICD-10-CM | POA: Diagnosis not present

## 2020-06-14 NOTE — ED Triage Notes (Signed)
Emergency Medicine Provider Triage Evaluation Note  [name] , a 75 y.o. female  was evaluated in triage.  Pt complains of left lower leg pain, swelling, redness x 1 month. On Eliquis, hx clots in same leg previously. On oral chemo for ovarian CA. No fevers, no injury.  Review of Systems  Positive: Leg pain, swelling, redness Negative: fever  Physical Exam  BP (!) 114/58 (BP Location: Right Arm)   Pulse 76   Temp 98.2 F (36.8 C) (Oral)   Resp 18   SpO2 100%  Gen:   Awake, no distress   HEENT:  Atraumatic  Resp:  Normal effort  Cardiac:  Normal rate  Abd:   Nondistended, nontender  MSK:   Moves extremities without difficulty, left lower leg with edema, erythema, tenderness.  Neuro:  Speech clear   Medical Decision Making  Medically screening exam initiated at 6:46 PM.  Appropriate orders placed.  [name] was informed that the remainder of the evaluation will be completed by another provider, this initial triage assessment does not replace that evaluation, and the importance of remaining in the ED until their evaluation is complete.  Clinical Impression     Roque Lias 06/14/20 1847

## 2020-06-14 NOTE — ED Triage Notes (Signed)
Pt here from home with c/oleft leg swelling and redness , pt is also currently receiving chemo  , pt is  on blood thinners

## 2020-06-14 NOTE — Progress Notes (Signed)
Left lower extremity venous duplex completed. Refer to "CV Proc" under chart review to view preliminary results.  06/14/2020 7:13 PM Kelby Aline., MHA, RVT, RDCS, RDMS

## 2020-06-14 NOTE — Telephone Encounter (Signed)
Called pt as requested. Pt states that her "home nurse" has recommended that she "go to the hospital because of my infection in my leg". Pt wanted to make MD Gorsuch's office aware. Pt verbalized that she will go to [facility] today. This RN informed pt that MD Alvy Bimler would be made aware.

## 2020-06-15 DIAGNOSIS — L03116 Cellulitis of left lower limb: Secondary | ICD-10-CM | POA: Diagnosis not present

## 2020-06-15 MED ORDER — DOXYCYCLINE HYCLATE 100 MG PO CAPS: 100.0000 mg | ORAL_CAPSULE | Freq: Two times a day (BID) | ORAL | 0 refills | Status: DC

## 2020-06-15 MED ORDER — DOXYCYCLINE HYCLATE 100 MG PO TABS
100.0000 mg | ORAL_TABLET | Freq: Once | ORAL | Status: AC
  Administered 2020-06-15: 100 mg via ORAL
  Filled 2020-06-15: qty 1

## 2020-06-15 NOTE — Telephone Encounter (Signed)
Hi Kathlee Nations,  Can you call and ask how she is doing? I did not see any ER visits

## 2020-06-15 NOTE — Telephone Encounter (Signed)
Ok I will see her tomorrow then

## 2020-06-15 NOTE — Discharge Instructions (Signed)
Take Lasix for the next 3 days.  Wear your compression stocking whenever you are out of bed we do not have the leg elevated.  Schedule follow-up with your primary doctor for a recheck.  If you develop increased swelling, fever, extension of the redness return to the ER for repeat evaluation.

## 2020-06-15 NOTE — ED Notes (Signed)
Per Dr Betsey Holiday pt does not need labs before DC

## 2020-06-15 NOTE — ED Notes (Signed)
Patient verbalizes understanding of discharge instructions. Opportunity for questioning and answers were provided. Armband removed by staff, pt discharged from ED via wheelchair.  

## 2020-06-15 NOTE — ED Provider Notes (Signed)
[facility] EMERGENCY DEPARTMENT Provider Note   CSN: [id number] Arrival date & time: 06/14/20  1756     History No chief complaint on file.   [name] is a 75 y.o. female.  Patient presents to the emergency department with complaints of left leg swelling and redness.  Patient reports a history of DVT in her left leg.  She is currently on Eliquis.  She also has chronic edema of both legs, left greater than right.  No associated chest pain or shortness of breath.  No open wounds.  Has not had a fever.        Past Medical History:  Diagnosis Date  . Acute upper respiratory infection 07/06/2014  . Anemia   . Arthritis    Back   . Colon polyp    Tubular Adenoma   . Cough productive of clear sputum 06/22/2014  . Family history of breast cancer   . GERD (gastroesophageal reflux disease)   . History of right bundle branch block (RBBB)   . HOH (hard of hearing)   . Hypertension    had in the past, is no longer on medication for this and blood pressures are WNL  . 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 Primary localized osteoarthritis of right knee   . Uterine cancer ([place]) 06/23/2018    Patient Active Problem List   Diagnosis Date Noted  . Redness and swelling of lower leg 05/26/2020  . Cystitis 03/20/2020  . Left hip pain 02/25/2020  . Hematuria 01/11/2020  . Acquired hypothyroidism 12/24/2019  . Preventive measure 11/12/2019  . Physical debility 01/29/2019  . Iron deficiency anemia 01/25/2019  . Deficiency anemia 01/08/2019  . Acetabulum fracture ([place]) 09/08/2018  . Left leg DVT ([place]) 09/07/2018  . Multiple open wounds of lower leg   . Pancytopenia, acquired ([place]) 07/21/2018  . Genetic testing 07/20/2018  . Family history of breast cancer   . 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 Other constipation 06/24/2018  . Goals of care, counseling/discussion 06/24/2018   . 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 ([place]) 06/24/2018  . Uterine cancer ([place]) 06/23/2018  . 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 Lower leg DVT (deep venous thromboembolism), chronic, left ([place]) 06/23/2018  . DVT (deep venous thrombosis) ([place]) 01/18/2018  . DJD (degenerative joint disease) of knee 07/04/2014  . Primary localized osteoarthritis of right knee   . Arthritis   . Anemia   . GERD (gastroesophageal reflux disease)   . Colon polyp   . HOH (hard of hearing)   . H/O echocardiogram 10/22/2011  . Postoperative anemia due to acute blood loss 05/01/2011  . 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 ABDOMINAL HYSTERECTOMY  2012  . CHOLECYSTECTOMY N/A 03/09/2013   Procedure: LAPAROSCOPIC CHOLECYSTECTOMY;  Surgeon: Gayland Curry, MD;  Location: [facility];  Service: General;  Laterality: N/A;  . COLONOSCOPY W/ BIOPSIES    . IR IMAGING GUIDED PORT INSERTION  06/29/2018  . LARYNGOSCOPY Left 03/14/2017   Procedure: LARYNGOSCOPY;  Surgeon: Helayne Seminole, MD;  Location: [facility];  Service: ENT;  Laterality: Left;  . LOWER EXTREMITY VENOGRAPHY Left 01/19/2018   Procedure: LOWER EXTREMITY VENOGRAPHY;  Surgeon: Waynetta Sandy, MD;  Location: [facility];  Service: Cardiovascular;  Laterality: Left;  . LOWER EXTREMITY VENOGRAPHY N/A 09/08/2018   Procedure: LOWER EXTREMITY VENOGRAPHY;  Surgeon: Waynetta Sandy,  MD;  Location: [facility];  Service: Cardiovascular;  Laterality: N/A;  . LYMPH NODE BIOPSY Left 06/18/2018   Procedure: EXCISIONAL BIOPSY LEFT INGUINAL LYMPH NODE;  Surgeon: Coralie Keens, MD;  Location: [facility];  Service: General;  Laterality: Left;  . PERIPHERAL VASCULAR INTERVENTION Left 01/19/2018   Procedure: PERIPHERAL VASCULAR INTERVENTION;  Surgeon: Waynetta Sandy, MD;  Location: [facility];  Service: Cardiovascular;  Laterality: Left;  LEFT ILIAC VENOUS  . PERIPHERAL VASCULAR INTERVENTION Left  09/08/2018   Procedure: PERIPHERAL VASCULAR INTERVENTION;  Surgeon: Waynetta Sandy, MD;  Location: [facility];  Service: Cardiovascular;  Laterality: Left;  lower extremity  . TOTAL KNEE ARTHROPLASTY  04/29/2011   Procedure: TOTAL KNEE ARTHROPLASTY;  Surgeon: Lorn Junes, MD;  Location: [facility];  Service: Orthopedics;  Laterality: Left;  DR [place] THIS CASE  . TOTAL KNEE ARTHROPLASTY Right 07/04/2014   Procedure: TOTAL KNEE ARTHROPLASTY;  Surgeon: Elsie Saas, MD;  Location: [facility];  Service: Orthopedics;  Laterality: Right;     OB History   No obstetric history on file.     Family History  Problem Relation Age of Onset  . Arthritis Mother   . Hypertension Mother   . Alzheimer's disease Father   . Diabetes Sister   . Hypertension Sister   . Hypertension Brother   . Stroke Brother   . Hypertension Brother   . Hypertension Sister   . Hypertension Sister   . Hypertension Sister   . Breast cancer Other        Niece  . Breast cancer Niece 81       sister's daughter  . Anesthesia problems Neg Hx   . Hypotension Neg Hx   . Malignant hyperthermia Neg Hx   . Pseudochol deficiency Neg Hx   . Colon cancer Neg Hx     Social History   Tobacco Use  . Smoking status: Former Smoker    Years: 1.00    Quit date: 04/22/1988    Years since quitting: 32.1  . Smokeless tobacco: Never Used  Vaping Use  . Vaping Use: Never used  Substance Use Topics  . Alcohol use: No  . Drug use: No    Home Medications Prior to Admission medications   Medication Sig Start Date End Date Taking? Authorizing Provider  doxycycline (VIBRAMYCIN) 100 MG capsule Take 1 capsule (100 mg total) by mouth 2 (two) times daily. 06/15/20  Yes Daurice Ovando, Gwenyth Allegra, MD  apixaban (ELIQUIS) 2.5 MG TABS tablet Take by mouth 2 (two) times daily.    [provider]  diclofenac sodium (VOLTAREN) 1 % GEL APPLY 4GRAMS 4 TIMES A DAY AS NEEDED FOR PAINS 11/05/18   [provider]  gabapentin (NEURONTIN) 300 MG capsule Take 300 mg by mouth 2 (two) times daily. 07/06/19   [provider]  methadone (DOLOPHINE) 10 MG tablet Take 1 tablet (10 mg total) by mouth every 12 (twelve) hours. 04/09/19   [name], MD  morphine (MSIR) 15 MG tablet Take 1 tablet (15 mg total) by mouth every 6 (six) hours as needed for severe pain. 05/26/20   [name], MD  Olopatadine HCl 0.2 % SOLN Place 1 drop into both eyes daily. 12/06/17   [provider]    Allergies    Patient has no known allergies.  Review of Systems   Review of Systems  Constitutional: Negative for fever.  Cardiovascular: Positive for leg swelling.  Skin:  Positive for color change. Negative for wound.    Physical Exam Updated Vital Signs BP (!) 114/58 (BP Location: Right Arm)   Pulse 76   Temp 98.2 F (36.8 C) (Oral)   Resp 18   SpO2 100%   Physical Exam Vitals and nursing note reviewed.  Constitutional:      General: She is not in acute distress.    Appearance: Normal appearance. She is well-developed.  HENT:     Head: Normocephalic and atraumatic.     Right Ear: Hearing normal.     Left Ear: Hearing normal.     Nose: Nose normal.  Eyes:     Conjunctiva/sclera: Conjunctivae normal.     Pupils: Pupils are equal, round, and reactive to light.  Cardiovascular:     Rate and Rhythm: Regular rhythm.     Heart sounds: S1 normal and S2 normal. No murmur heard. No friction rub. No gallop.   Pulmonary:     Effort: Pulmonary effort is normal. No respiratory distress.     Breath sounds: Normal breath sounds.  Chest:     Chest wall: No tenderness.  Abdominal:     General: Bowel sounds are normal.     Palpations: Abdomen is soft.     Tenderness: There is no abdominal tenderness. There is no guarding or rebound. Negative signs include Murphy's sign and McBurney's sign.     Hernia: No hernia is present.  Musculoskeletal:        General: Normal range of motion.     Cervical  back: Normal range of motion and neck supple.     Right lower leg: Edema present.     Left lower leg: Edema present.     Comments: Bilateral lower extremity edema, left greater than right  Skin:    General: Skin is warm and dry.     Findings: No rash.     Comments: Anterior shin with erythema but no increased warmth.  No open wounds.  Bilateral lower extremities with skin changes consistent with chronic venous insufficiency.  Neurological:     Mental Status: She is alert and oriented to person, place, and time.     GCS: GCS eye subscore is 4. GCS verbal subscore is 5. GCS motor subscore is 6.     Cranial Nerves: No cranial nerve deficit.     Sensory: No sensory deficit.     Coordination: Coordination normal.  Psychiatric:        Speech: Speech normal.        Behavior: Behavior normal.        Thought Content: Thought content normal.     ED Results / Procedures / Treatments   Labs (all labs ordered are listed, but only abnormal results are displayed) Labs Reviewed  COMPREHENSIVE METABOLIC PANEL  CBC WITH DIFFERENTIAL/PLATELET    EKG None  Radiology VAS [place] LOWER EXTREMITY VENOUS (DVT) ([facility] and [facility] 7a-7p)  Result Date: 06/14/2020  Lower Venous DVT Study Patient Name:  RAHIMA FLEISHMAN [address] [facility]  Date of Exam:   06/14/2020 Medical Rec #: [id number]       Accession #:    [id number] Date of Birth: November 11, 1945        Patient Gender: F Patient Age:   40Y Exam Location:  [facility] Procedure:      VAS [place] LOWER EXTREMITY VENOUS (DVT) Referring Phys: [id number] LAURA A MURPHY --------------------------------------------------------------------------------  Indications: Edema, Erythema, and history of DVT.  Limitations: Body habitus, depth of vessels, clothing restriction, patient unable  to lie flat and poor ultrasound/tissue interface. Comparison Study: 04/12/2018- left lower extremity venous duplex- age                   indeterminate DVT left CFV and proximal FV Performing Technologist: Maudry Mayhew MHA, RDMS, RVT, RDCS  Examination Guidelines: A complete evaluation includes B-mode imaging, spectral Doppler, color Doppler, and power Doppler as needed of all accessible portions of each vessel. Bilateral testing is considered an integral part of a complete examination. Limited examinations for reoccurring indications may be performed as noted. The reflux portion of the exam is performed with the patient in reverse Trendelenburg.  Right Technical Findings: Not visualized segments include CFV.  +---------+---------------+---------+-----------+----------+--------------+ LEFT     CompressibilityPhasicitySpontaneityPropertiesThrombus Aging +---------+---------------+---------+-----------+----------+--------------+ FV Prox  Full                    Yes        patent                   +---------+---------------+---------+-----------+----------+--------------+ FV Mid   Full                    Yes        patent                   +---------+---------------+---------+-----------+----------+--------------+ FV DistalFull                    Yes        patent                   +---------+---------------+---------+-----------+----------+--------------+ POP      Full           Yes      Yes        patent                   +---------+---------------+---------+-----------+----------+--------------+ PTV                              Yes        patent                   +---------+---------------+---------+-----------+----------+--------------+ PERO                             Yes        patent                   +---------+---------------+---------+-----------+----------+--------------+   Left Technical Findings: Not visualized segments include CFV, SFJ, PFV.   Summary: LEFT: - There is no evidence of deep vein thrombosis in the lower extremity. However, portions of this examination were limited- see technologist comments above.  - No cystic structure found in the popliteal  fossa.  *See table(s) above for measurements and observations.    Preliminary     Procedures Procedures   Medications Ordered in ED Medications  doxycycline (VIBRA-TABS) tablet 100 mg (has no administration in time range)    ED Course  I have reviewed the triage vital signs and the nursing notes.  Pertinent labs & imaging results that were available during my care of the patient were reviewed by me and considered in my medical decision making (see chart for details).    MDM Rules/Calculators/A&P  Patient with increased swelling of her left leg.  She has chronic swelling of both legs secondary to venous insufficiency.  She does, however, have a history of DVT in the left leg.  Venous duplex was performed tonight and does not show any evidence of DVT.  She is currently on Eliquis, will continue her current dosing.  She does have mild erythema of the lower portion of the leg anteriorly but there is no warmth or associated wounds.  No induration or fluctuance.  Increased swelling might be secondary to her venous insufficiency and redness may be secondary to stasis dermatitis, however cannot rule out infection.  Patient does have Lasix that she uses occasionally at home but has not used in a while.  Recommend she does 3 days of Lasix.  She has a compression stocking that she will use anytime she is out of bed.  Discussed the possibility of blood work today but patient has been in the emergency department waiting room for a number of hours and would prefer antibiotics and follow-up with primary doctor.  Final Clinical Impression(s) / ED Diagnoses Final diagnoses:  Cellulitis of left lower extremity    Rx / DC Orders ED Discharge Orders         Ordered    doxycycline (VIBRAMYCIN) 100 MG capsule  2 times daily        06/15/20 0252           Orpah Greek, MD 06/15/20 [id number]

## 2020-06-16 ENCOUNTER — Inpatient Hospital Stay: Payer: Medicare Other

## 2020-06-16 ENCOUNTER — Other Ambulatory Visit: Payer: Self-pay

## 2020-06-16 ENCOUNTER — Inpatient Hospital Stay (HOSPITAL_BASED_OUTPATIENT_CLINIC_OR_DEPARTMENT_OTHER): Payer: Medicare Other | Admitting: Hematology and Oncology

## 2020-06-16 ENCOUNTER — Encounter: Payer: Self-pay | Admitting: Hematology and Oncology

## 2020-06-16 DIAGNOSIS — C55 Malignant neoplasm of uterus, part unspecified: Secondary | ICD-10-CM

## 2020-06-16 DIAGNOSIS — I825Z2 Chronic embolism and thrombosis of unspecified deep veins of left distal lower extremity: Secondary | ICD-10-CM

## 2020-06-16 DIAGNOSIS — E039 Hypothyroidism, unspecified: Secondary | ICD-10-CM

## 2020-06-16 DIAGNOSIS — C801 Malignant (primary) neoplasm, unspecified: Secondary | ICD-10-CM

## 2020-06-16 DIAGNOSIS — G893 Neoplasm related pain (acute) (chronic): Secondary | ICD-10-CM | POA: Diagnosis not present

## 2020-06-16 DIAGNOSIS — L03116 Cellulitis of left lower limb: Secondary | ICD-10-CM | POA: Diagnosis not present

## 2020-06-16 DIAGNOSIS — C541 Malignant neoplasm of endometrium: Secondary | ICD-10-CM | POA: Diagnosis not present

## 2020-06-16 DIAGNOSIS — Z7189 Other specified counseling: Secondary | ICD-10-CM

## 2020-06-16 DIAGNOSIS — C7951 Secondary malignant neoplasm of bone: Secondary | ICD-10-CM

## 2020-06-16 DIAGNOSIS — C774 Secondary and unspecified malignant neoplasm of inguinal and lower limb lymph nodes: Secondary | ICD-10-CM

## 2020-06-16 LAB — COMPREHENSIVE METABOLIC PANEL
ALT: 10 U/L (ref 0–44)
AST: 13 U/L — ABNORMAL LOW (ref 15–41)
Albumin: 3.8 g/dL (ref 3.5–5.0)
Alkaline Phosphatase: 140 U/L — ABNORMAL HIGH (ref 38–126)
Anion gap: 11 (ref 5–15)
BUN: 15 mg/dL (ref 8–23)
CO2: 24 mmol/L (ref 22–32)
Calcium: 9.2 mg/dL (ref 8.9–10.3)
Chloride: 105 mmol/L (ref 98–111)
Creatinine, Ser: 0.7 mg/dL (ref 0.44–1.00)
GFR, Estimated: >60 mL/min (ref >60)
Glucose, Bld: 94 mg/dL (ref 70–99)
Potassium: 3.9 mmol/L (ref 3.5–5.1)
Sodium: 140 mmol/L (ref 135–145)
Total Bilirubin: 0.7 mg/dL (ref 0.3–1.2)
Total Protein: 7.4 g/dL (ref 6.5–8.1)

## 2020-06-16 LAB — CBC WITH DIFFERENTIAL/PLATELET
Abs Immature Granulocytes: 0.02 10*3/uL (ref 0.00–0.07)
Basophils Absolute: 0 10*3/uL (ref 0.0–0.1)
Basophils Relative: 1 %
Eosinophils Absolute: 0.1 10*3/uL (ref 0.0–0.5)
Eosinophils Relative: 2 %
HCT: 37 % (ref 36.0–46.0)
Hemoglobin: 12.3 g/dL (ref 12.0–15.0)
Immature Granulocytes: 1 %
Lymphocytes Relative: 24 %
Lymphs Abs: 1 10*3/uL (ref 0.7–4.0)
MCH: 30.4 pg (ref 26.0–34.0)
MCHC: 33.2 g/dL (ref 30.0–36.0)
MCV: 91.4 fL (ref 80.0–100.0)
Monocytes Absolute: 0.4 10*3/uL (ref 0.1–1.0)
Monocytes Relative: 11 %
Neutro Abs: 2.5 10*3/uL (ref 1.7–7.7)
Neutrophils Relative %: 61 %
Platelets: 191 10*3/uL (ref 150–400)
RBC: 4.05 MIL/uL (ref 3.87–5.11)
RDW: 13.7 % (ref 11.5–15.5)
WBC: 4 10*3/uL (ref 4.0–10.5)
nRBC: 0 % (ref 0.0–0.2)

## 2020-06-16 LAB — TSH: TSH: 1.202 u[IU]/mL (ref 0.308–3.960)

## 2020-06-16 MED ORDER — SODIUM CHLORIDE 0.9 % IV SOLN
Freq: Once | INTRAVENOUS | Status: AC
  Filled 2020-06-16: qty 250

## 2020-06-16 MED ORDER — SODIUM CHLORIDE 0.9 % IV SOLN
200.0000 mg | Freq: Once | INTRAVENOUS | Status: AC
  Administered 2020-06-16: 200 mg via INTRAVENOUS
  Filled 2020-06-16: qty 8

## 2020-06-16 MED ORDER — HEPARIN SOD (PORK) LOCK FLUSH 100 UNIT/ML IV SOLN
500.0000 [IU] | Freq: Once | INTRAVENOUS | Status: AC | PRN
  Administered 2020-06-16: 500 [IU]
  Filled 2020-06-16: qty 5

## 2020-06-16 MED ORDER — SODIUM CHLORIDE 0.9% FLUSH
10.0000 mL | INTRAVENOUS | Status: DC | PRN
  Administered 2020-06-16: 10 mL
  Filled 2020-06-16: qty 10

## 2020-06-16 NOTE — Assessment & Plan Note (Signed)
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 

## 2020-06-16 NOTE — Progress Notes (Signed)
[place] OFFICE PROGRESS NOTE  Patient Care Team: Nolene Ebbs, MD as PCP - General (Internal Medicine)  ASSESSMENT & PLAN:  Uterine cancer [facility]) Per previous discussion, the plan would be to continue pembrolizumab indefinitely I do not plan to repeat imaging study again for few months, probably around July  Cancer associated pain She has less pain Plan to reduce the dose of morphine sulfate She does not need pain medicine refill today  Lower leg DVT (deep venous thromboembolism), chronic, left ([place]) She is taking both anticoagulation therapy and antiplatelet agent She has no recent bleeding Her hemoglobin is normal I reassured the patient She will continue her medications as directed  Left leg cellulitis She has recurrent cellulitis of the left lower extremity which is not uncommon given her chronic venous congestion I agree with the choices of antibiotics and encouraged the patient to continue her antibiotics and elevate her legs   No orders of the defined types were placed in this encounter.   All questions were answered. The patient knows to call the clinic with any problems, questions or concerns. The total time spent in the appointment was 20 minutes encounter with patients including review of chart and various tests results, discussions about plan of care and coordination of care plan   [name], MD 06/16/2020 1:58 PM  INTERVAL HISTORY: Please see below for problem oriented charting. 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 Uterus +/- tubes/ovaries, neoplastic, with left fallopian tube and ovary - INVASIVE ENDOMETRIOID CARCINOMA (1.5 CM), FIGO GRADE II, ARISING IN A BACKGROUND OF ATYPICAL COMPLEX HYPERPLASIA, CONFINED WITHIN INNER HALF OF THE MYOMETRIUM. - ENDOMETRIAL POLYP WITH ASSOCIATED ATYPICAL COMPLEX HYPERPLASIA. - MYOMETRIUM: LEIOMYOMATA. - CERVIX: BENIGN SQUAMOUS MUCOSA AND ENDOCERVICAL MUCOSA, NO DYSPLASIA OR MALIGNANCY. - LEFT OVARY: BENIGN OVARIAN TISSUE WITH ENDOSALPINGOSIS, NO EVIDENCE OF ATYPIA OR MALIGNANCY. - LEFT FALLOPIAN TUBE: NO HISTOLOGIC ABNORMALITIES. - PLEASE SEE ONCOLOGY TEMPLATE FOR DETAIL. 2. Ovary and fallopian tube, right - BENIGN OVARIAN TISSUE WITH ENDOSALPINGOSIS, NO ATYPIA OR MALIGNANCY. - BENIGN FALLOPIAN TUBAL TISSUE, NO PATHOLOGIC ABNORMALITIES. Microscopic Comment 1. UTERUS Specimen: Uterus, cervix, bilateral ovaries and fallopian tubes Procedure: Total hysterectomy and bilateral salpingo-oophorectomy Lymph node sampling performed: No Specimen integrity: Intact Maximum tumor size (cm): 1.5 cm, glass slide measurement Histologic type: Invasive endometrioid carcinoma Grade: FIGO grade II Myometrial invasion: 1 cm where myometrium is 2.3 cm in thickness Cervical stromal involvement: No Extent of involvement of other organs: No Lymph vascular invasion: Not identified Peritoneal washings: Negative ([id number]) Lymph nodes: number examined N/A; number positive N/A TNM code: pT1a, pNX 1 oFf 3IGO Stage (based on pathologic findings, needs clinical correlation): IA  Comments: Sections the endomyometrium away from the grossly identified endometrial polyp show an invasive FIGO grade II endometrioid carcinoma. The tumor is confined within inner half of the myometrium. No angiolymphatic invasion is identified. No cervical stromal involvement is identified. Sections of the grossly identified endometrial polyp show an endometrial polyp with  associated atypical compacted hyperplasia with no definitive evidence of carcinoma.   12/07/2017 Imaging   US venous  Doppler Right: No evidence of common femoral vein obstruction. Left: Findings consistent with acute deep vein thrombosis involving the left femoral vein, left proximal profunda vein, and left popliteal vein. Unable to adequately interrogate the common femoral and higher, or the calf secondary to significant edema and body habitus   12/07/2017 [facility] Admission   She presented to the ER and was diagnosed with acute DVT   01/18/2018 - 01/21/2018 Hospital Admission   She was admitted to the hospital for management of severe persistent DVT   01/18/2018 Imaging   US venous Doppler Right: No evidence of common femoral vein obstruction. Left: Findings consistent with acute deep vein thrombosis involving the left common femoral vein, and left popliteal vein.   01/19/2018 Surgery   Pre-operative Diagnosis: Subacute DVT with severe post thrombotic syndrome Post-operative diagnosis:  Same Surgeon:  Erlene Quan C. Donzetta Matters, MD Procedure Performed: 1.  Ultrasound-guided cannulation left small saphenous vein 2.  Left lower extremity and central venography 3.  Intravascular ultrasound of left popliteal, femoral, common femoral, external and common iliac veins and IVC 4.  Stent of left common and external iliac veins with 14 x 60 mm Vici 5.  Moderate sedation with fentanyl and Versed for 50 minutes  Indications: 75 year old female with a history of DVT in October now presents with persistent left lower extremity swelling and ultrasound demonstrating likely persistent DVT.  She has been on Xarelto at this time.  She is now indicated for venogram possible intervention.  Findings: Flow in the left lower extremity was stagnant throughout but by venogram all veins were patent.  There was a focal occlusive area approximately 2 cm in length at the common and external iliac vein junction at the  hypogastric on the left.  After stenting and ballooning we had a diameter of 12 millimeters in the stent and venogram demonstrated flow in the lower extremity veins were previously was stagnant and no further residual stenosis in the left common and external iliac vein junction.   04/12/2018 Imaging   US Venous Doppler Right: No evidence of common femoral vein obstruction. Left: There is no evidence of deep vein thrombosis in the lower extremity. However, portions of this examination were limited- see technologist comments above. Left groin: Large hypoechoic area with mixed echoes noted measuring nearly 10 cm. Possible  hematoma versus unknown etiology. Ultrasound characteristics of enlarged lymph nodes noted in the groin.      05/15/2018 Imaging   US Venous Doppler Right: No evidence of deep vein thrombosis in the lower extremity. No indirect evidence of obstruction proximal to the inguinal ligament. Left: No reflux was noted in the common femoral vein , femoral vein in the thigh, popliteal vein, great saphenous vein at the saphenofemoral junction, great saphenous vein at the proximal thigh, great saphenous vein at the mid thigh, great saphenous vein  at the distal thigh, great saphenous vein at the knee, origin of the small saphenous vein, proximal small saphenous vein, and mid small saphenous vein. There is no evidence of deep vein thrombosis in the lower extremity. There is no evidence of superficial venous thrombosis. No cystic structure found in the popliteal fossa. Unable to evaluate extension of common femoral vein obstruction proximal to the inguinal ligament.   06/01/2018 Imaging   1. 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 Additionally, there is lucency involving the medial sidewall of the left acetabulum with potential nondisplaced pathologic fracture. Further evaluation  with  contrast-enhanced pelvic MRI could be performed as clinically indicated. 2. The left pelvic arterial and venous system is encased by this infiltrative left pelvic sidewall mass however while difficult to ascertain, the left external iliac venous stent appears patent.   06/18/2018 Pathology Results   Lymph node for lymphoma, Left Inguinal - METASTATIC ADENOCARCINOMA, SEE COMMENT. Microscopic Comment Immunohistochemistry is positive for cytokeratin 7, PAX8, ER, and PR. Cytokeratin 5/6,and p63 are negative. The immunoprofile along with the patient's history are consistent with a gynecologic primary.   06/18/2018 Surgery   Pre-op Diagnosis: INGUINAL LYMPHADENOPATHY, PELVIC MASS     Procedure(s): EXCISIONAL BIOPSY DEEP LEFT INGUINAL LYMPH NODE  Surgeon(s): Coralie Keens, MD    06/24/2018 Cancer Staging   Staging form: Corpus Uteri - Carcinoma and Carcinosarcoma, AJCC 8th Edition - Clinical: Stage IVB (cT1a, cN2, pM1) - Signed by [name], MD on 06/24/2018    Genetic Testing   Patient has genetic testing done for MMR on pathology from 06/18/2018. Results revealed patient has the following mutation(s): MMR: abnormal   06/29/2018 Procedure   Placement of a subcutaneous port device. Catheter tip at the SVC and right atrium junction.    Genetic Testing   Patient has genetic testing done for MSI on pathology from 06/18/2018. Results revealed patient has the following mutation(s): MSI: High   07/02/2018 PET scan   Previous hysterectomy, with asymmetric focus of hypermetabolic activity in the left vaginal cuff, suspicious for residual or recurrent carcinoma.  Large hypermetabolic soft tissue mass involving the left pelvic sidewall and acetabulum, consistent with metastatic disease.  No evidence metastatic disease within the abdomen, chest, or neck.   07/09/2018 Tumor Marker   Patient's tumor was tested for the following markers: CA-125 Results of the tumor marker test revealed 9    07/10/2018 - 08/24/2018 Chemotherapy   The patient had carboplatin and taxol x 3 cycles   07/17/2018 Genetic Testing   Negative genetic testing on the common hereditary cancer panel.  The Common Hereditary Gene Panel offered by [facility] includes sequencing and/or deletion duplication testing of the following 48 genes: APC, ATM, AXIN2, BARD1, BMPR1A, BRCA1, BRCA2, BRIP1, CDH1, CDK4, CDKN2A (p14ARF), CDKN2A (p16INK4a), CHEK2, CTNNA1, DICER1, EPCAM (Deletion/duplication testing only), GREM1 (promoter region deletion/duplication testing only), KIT, MEN1, MLH1, MSH2, MSH3, MSH6, MUTYH, NBN, NF1, NHTL1, PALB2, PDGFRA, PMS2, POLD1, POLE, PTEN, RAD50, RAD51C, RAD51D, RNF43, SDHB, SDHC, SDHD, SMAD4, SMARCA4. STK11, TP53, TSC1, TSC2, and VHL.  The following genes were evaluated for sequence changes only: SDHA and HOXB13 c.251G>A variant only. The report date is Jul 17, 2018.    10/03/2018 Imaging   CT abdomen and pelvis 1.  No acute intra-abdominal process. 2. Grossly unchanged left pelvic sidewall mass with osseous involvement of the medial acetabulum. Progressive mild displacement of the associated comminuted pathologic fracture involving the right acetabulum and puboacetabular junction.  3. New venous stents extending from the left common iliac vein origin to the proximal left common femoral vein. The stents are patent.   11/06/2018 -  Chemotherapy   The patient had pembrolizumab for chemotherapy treatment.     01/28/2019 Imaging   1. No substantial interval change in exam. 2. Interval development of mild fullness in the left intrarenal collecting system and ureter without overt hydronephrosis at this time. 3. Abnormal soft tissue along the left pelvic sidewall has decreased slightly in the interval. 4. Similar appearance of ill-defined fascial planes in the pelvis with some peritoneal thickening along the right pelvic sidewall and potentially involving the sigmoid  mesocolon. 5. No substantial ascites.    05/03/2019 Imaging   1. Stable mild left pelvic sidewall soft tissue density. No new or progressive disease identified within the abdomen or pelvis.  2. Colonic diverticulosis. No radiographic evidence of diverticulitis.   Aortic Atherosclerosis (ICD10-I70.0).   09/09/2019 Imaging   1. No change in appearance of soft tissue thickening along the LEFT pelvic sidewall adjacent to chronic LEFT acetabular fracture. 2. Mild asymmetry of the bladder wall favoring the LEFT bladder wall, not well assessed. Similar accounting for variable degrees of distension on prior studies potentially related to prior radiation, attention on follow-up. 3. Signs of venous stenting in the LEFT hemipelvis with LEFT lower extremity muscular atrophy and mild stranding with similar appearance. Signs of colonic diverticulosis and diverticular disease without change.   02/24/2020 Imaging   1. Unchanged appearance of the pelvis as detailed below. 2. Unchanged soft tissue thickening of the left pelvic sidewall. 3. 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 4. No evidence discrete mass or lymphadenopathy nor metastatic disease in the abdomen or pelvis. 5. Status post hysterectomy and cholecystectomy. 6. Left common iliac vein stent. 7. Pancolonic diverticulosis.     Metastasis to lymph nodes ([place])  06/23/2018 Initial Diagnosis   Metastasis to lymph nodes ([place])   07/10/2018 - 08/24/2018 Chemotherapy   The patient had palonosetron (ALOXI) injection 0.25 mg, 0.25 mg, Intravenous,  Once, 3 of 6 cycles Administration: 0.25 mg (07/10/2018), 0.25 mg (07/31/2018), 0.25 mg (08/24/2018) CARBOplatin (PARAPLATIN) 480 mg in sodium chloride 0.9 % 250 mL chemo infusion, 480 mg (100 % of original dose 482.5 mg), Intravenous,  Once, 3 of 6 cycles Dose modification: 482.5 mg (original dose 482.5 mg, Cycle 1) Administration: 480 mg (07/10/2018), 480 mg  (07/31/2018), 480 mg (08/24/2018) PACLitaxel (TAXOL) 276 mg in sodium chloride 0.9 % 250 mL chemo infusion (> 56m/m2), 140 mg/m2 = 276 mg (80 % of original dose 175 mg/m2), Intravenous,  Once, 3 of 6 cycles Dose modification: 140 mg/m2 (80 % of original dose 175 mg/m2, Cycle 1, Reason: Dose Not Tolerated) Administration: 276 mg (07/10/2018), 276 mg (07/31/2018), 276 mg (08/24/2018) fosaprepitant (EMEND) 150 mg, dexamethasone (DECADRON) 12 mg in sodium chloride 0.9 % 145 mL IVPB, , Intravenous,  Once, 3 of 6 cycles Administration:  (07/10/2018),  (07/31/2018),  (08/24/2018)  for chemotherapy treatment.    11/06/2018 -  Chemotherapy   The patient had pembrolizumab for chemotherapy treatment.     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 ([place]  06/24/2018 Initial Diagnosis   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 ([place]   07/10/2018 - 08/24/2018 Chemotherapy   The patient had palonosetron (ALOXI) injection 0.25 mg, 0.25 mg, Intravenous,  Once, 3 of 6 cycles Administration: 0.25 mg (07/10/2018), 0.25 mg (07/31/2018), 0.25 mg (08/24/2018) CARBOplatin (PARAPLATIN) 480 mg in sodium chloride 0.9 % 250 mL chemo infusion, 480 mg (100 % of original dose 482.5 mg), Intravenous,  Once, 3 of 6 cycles Dose modification: 482.5 mg (original dose 482.5 mg, Cycle 1) Administration: 480 mg (07/10/2018), 480 mg (07/31/2018), 480 mg (08/24/2018) PACLitaxel (TAXOL) 276 mg in sodium chloride 0.9 % 250 mL chemo infusion (> 870mm2), 140 mg/m2 = 276 mg (80 % of original dose 175 mg/m2), Intravenous,  Once, 3 of 6 cycles Dose modification: 140 mg/m2 (80 % of original dose 175 mg/m2, Cycle 1, Reason: Dose Not Tolerated) Administration: 276 mg (07/10/2018), 276 mg (07/31/2018), 276 mg (08/24/2018) fosaprepitant (EMEND) 150 mg, dexamethasone (DECADRON)  12 mg in sodium chloride 0.9 % 145 mL IVPB, , Intravenous,  Once, 3 of 6 cycles Administration:  (07/10/2018),  (07/31/2018),  (08/24/2018)  for chemotherapy treatment.    11/06/2018 -  Chemotherapy   The patient had pembrolizumab for  chemotherapy treatment.     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       REVIEW OF SYSTEMS:   Constitutional: Denies fevers, chills or abnormal weight loss Eyes: Denies blurriness of vision Ears, nose, mouth, throat, and face: Denies mucositis or sore throat Respiratory: Denies cough, dyspnea or wheezes Cardiovascular: Denies palpitation, chest discomfort  Gastrointestinal:  Denies nausea, heartburn or change in bowel habits Lymphatics: Denies new lymphadenopathy or easy bruising Neurological:Denies numbness, tingling or new weaknesses Behavioral/Psych: Mood is stable, no new changes  All other systems were reviewed with the patient and are negative.  I have reviewed the past medical history, past surgical history, social history and family history with the patient and they are unchanged from previous note.  ALLERGIES:  has No Known Allergies.  MEDICATIONS:  Current Outpatient Medications  Medication Sig Dispense Refill  . apixaban (ELIQUIS) 2.5 MG TABS tablet Take by mouth 2 (two) times daily.    . diclofenac sodium (VOLTAREN) 1 % GEL APPLY 4GRAMS 4 TIMES A DAY AS NEEDED FOR PAINS    . doxycycline (VIBRAMYCIN) 100 MG capsule Take 1 capsule (100 mg total) by mouth 2 (two) times daily. 20 capsule 0  . gabapentin (NEURONTIN) 300 MG capsule Take 300 mg by mouth 2 (two) times daily.    . methadone (DOLOPHINE) 10 MG tablet Take 1 tablet (10 mg total) by mouth every 12 (twelve) hours. 60 tablet 0  . morphine (MSIR) 15 MG tablet Take 1 tablet (15 mg total) by mouth every 6 (six) hours as needed for severe pain. 60 tablet 0  . Olopatadine HCl 0.2 % SOLN Place 1 drop into both eyes daily.     No current facility-administered medications  for this visit.   Facility-Administered Medications Ordered in Other Visits  Medication Dose Route Frequency Provider Last Rate Last Admin  . sodium chloride flush (NS) 0.9 % injection 10 mL  10 mL Intracatheter PRN Alvy Bimler, Sevastian Witczak, MD   10 mL at 06/16/20 1327    PHYSICAL EXAMINATION: ECOG PERFORMANCE STATUS: 2 - Symptomatic, <50% confined to bed  Vitals:   06/16/20 1132  BP: 122/66  Pulse: 75  Resp: 16  Temp: 97.9 F (36.6 C)  SpO2: 100%   Filed Weights   06/16/20 1132  Weight: 220 lb (99.8 kg)    GENERAL:alert, no distress and comfortable SKIN: Noted cellulitis affecting the left lower extremity EYES: normal, Conjunctiva are pink and non-injected, sclera clear OROPHARYNX:no exudate, no erythema and lips, buccal mucosa, and tongue normal  NECK: supple, thyroid normal size, non-tender, without nodularity LYMPH:  no palpable lymphadenopathy in the cervical, axillary or inguinal LUNGS: clear to auscultation and percussion with normal breathing effort HEART: She has bilateral lower extremity edema ABDOMEN:abdomen soft, non-tender and normal bowel sounds Musculoskeletal:no cyanosis of digits and no clubbing  NEURO: alert & oriented x 3 with fluent speech, no focal motor/sensory deficits  LABORATORY DATA:  I have reviewed the data as listed    Component Value Date/Time   NA 140 06/16/2020 1115   K 3.9 06/16/2020  1115   CL 105 06/16/2020 1115   CO2 24 06/16/2020 1115   GLUCOSE 94 06/16/2020 1115   BUN 15 06/16/2020 1115   CREATININE 0.70 06/16/2020 1115   CREATININE 0.66 12/24/2019 1023   CALCIUM 9.2 06/16/2020 1115   PROT 7.4 06/16/2020 1115   ALBUMIN 3.8 06/16/2020 1115   AST 13 (L) 06/16/2020 1115   AST 11 (L) 12/24/2019 1023   ALT 10 06/16/2020 1115   ALT 7 12/24/2019 1023   ALKPHOS 140 (H) 06/16/2020 1115   BILITOT 0.7 06/16/2020 1115   BILITOT 0.6 12/24/2019 1023   GFRNONAA >60 06/16/2020 1115   GFRNONAA >60 12/24/2019 1023   GFRAA >60 11/12/2019 1222    No  results found for: SPEP, UPEP  Lab Results  Component Value Date   WBC 4.0 06/16/2020   NEUTROABS 2.5 06/16/2020   HGB 12.3 06/16/2020   HCT 37.0 06/16/2020   MCV 91.4 06/16/2020   PLT 191 06/16/2020      Chemistry      Component Value Date/Time   NA 140 06/16/2020 1115   K 3.9 06/16/2020 1115   CL 105 06/16/2020 1115   CO2 24 06/16/2020 1115   BUN 15 06/16/2020 1115   CREATININE 0.70 06/16/2020 1115   CREATININE 0.66 12/24/2019 1023      Component Value Date/Time   CALCIUM 9.2 06/16/2020 1115   ALKPHOS 140 (H) 06/16/2020 1115   AST 13 (L) 06/16/2020 1115   AST 11 (L) 12/24/2019 1023   ALT 10 06/16/2020 1115   ALT 7 12/24/2019 1023   BILITOT 0.7 06/16/2020 1115   BILITOT 0.6 12/24/2019 1023       RADIOGRAPHIC STUDIES: I have personally reviewed the radiological images as listed and agreed with the findings in the report. VAS [place] LOWER EXTREMITY VENOUS (DVT) ([facility] and [facility] 7a-7p)  Result Date: 06/15/2020  Lower Venous DVT Study Patient Name:  PEBBLES ZEIDERS [facility]  Date of Exam:   06/14/2020 Medical Rec #: [id number]       Accession #:    [id number] Date of Birth: 06-13-1945        Patient Gender: F Patient Age:   42Y Exam Location:  [facility] Procedure:      VAS [place] LOWER EXTREMITY VENOUS (DVT) Referring Phys: [id number] LAURA A MURPHY --------------------------------------------------------------------------------  Indications: Edema, Erythema, and history of DVT.  Limitations: Body habitus, depth of vessels, clothing restriction, patient unable to lie flat and poor ultrasound/tissue interface. Comparison Study: 04/12/2018- left lower extremity venous duplex- age                   indeterminate DVT left CFV and proximal FV Performing Technologist: Maudry Mayhew MHA, RDMS, RVT, RDCS  Examination Guidelines: A complete evaluation includes B-mode imaging, spectral Doppler, color Doppler, and power Doppler as needed of all accessible portions of each vessel. Bilateral testing is  considered an integral part of a complete examination. Limited examinations for reoccurring indications may be performed as noted. The reflux portion of the exam is performed with the patient in reverse Trendelenburg.  Right Technical Findings: Not visualized segments include CFV.  +---------+---------------+---------+-----------+----------+--------------+ LEFT     CompressibilityPhasicitySpontaneityPropertiesThrombus Aging +---------+---------------+---------+-----------+----------+--------------+ FV Prox  Full                    Yes        patent                   +---------+---------------+---------+-----------+----------+--------------+ FV Mid  Full                    Yes        patent                   +---------+---------------+---------+-----------+----------+--------------+ FV DistalFull                    Yes        patent                   +---------+---------------+---------+-----------+----------+--------------+ POP      Full           Yes      Yes        patent                   +---------+---------------+---------+-----------+----------+--------------+ PTV                              Yes        patent                   +---------+---------------+---------+-----------+----------+--------------+ PERO                             Yes        patent                   +---------+---------------+---------+-----------+----------+--------------+   Left Technical Findings: Not visualized segments include CFV, SFJ, PFV.   Summary: LEFT: - There is no evidence of deep vein thrombosis in the lower extremity. However, portions of this examination were limited- see technologist comments above.  - No cystic structure found in the popliteal fossa.  *See table(s) above for measurements and observations. Electronically signed by Jamelle Haring on 06/15/2020 at 3:38:20 PM.    Final

## 2020-06-16 NOTE — Assessment & Plan Note (Signed)
She has recurrent cellulitis of the left lower extremity which is not uncommon given her chronic venous congestion I agree with the choices of antibiotics and encouraged the patient to continue her antibiotics and elevate her legs

## 2020-06-16 NOTE — Patient Instructions (Signed)
[facility] Discharge Instructions for Patients Receiving Chemotherapy  Today you received the following chemotherapy agents:  Keytruda.  To help prevent nausea and vomiting after your treatment, we encourage you to take your nausea medication as directed.   If you develop nausea and vomiting that is not controlled by your nausea medication, call the clinic.   BELOW ARE SYMPTOMS THAT SHOULD BE REPORTED IMMEDIATELY:  *FEVER GREATER THAN 100.5 F  *CHILLS WITH OR WITHOUT FEVER  NAUSEA AND VOMITING THAT IS NOT CONTROLLED WITH YOUR NAUSEA MEDICATION  *UNUSUAL SHORTNESS OF BREATH  *UNUSUAL BRUISING OR BLEEDING  TENDERNESS IN MOUTH AND THROAT WITH OR WITHOUT PRESENCE OF ULCERS  *URINARY PROBLEMS  *BOWEL PROBLEMS  UNUSUAL RASH Items with * indicate a potential emergency and should be followed up as soon as possible.  Feel free to call the clinic should you have any questions or concerns. The clinic phone number is [phone].  Please show the CHEMO ALERT CARD at check-in to the Emergency Department and triage nurse.    

## 2020-06-16 NOTE — Assessment & Plan Note (Signed)
She is taking both anticoagulation therapy and antiplatelet agent She has no recent bleeding Her hemoglobin is normal I reassured the patient She will continue her medications as directed 

## 2020-06-16 NOTE — Assessment & Plan Note (Signed)
She has less pain Plan to reduce the dose of morphine sulfate She does not need pain medicine refill today 

## 2020-07-07 ENCOUNTER — Other Ambulatory Visit: Payer: Medicare Other

## 2020-07-07 ENCOUNTER — Other Ambulatory Visit: Payer: Self-pay

## 2020-07-07 ENCOUNTER — Encounter: Payer: Self-pay | Admitting: Hematology and Oncology

## 2020-07-07 ENCOUNTER — Inpatient Hospital Stay: Payer: Medicare Other

## 2020-07-07 ENCOUNTER — Inpatient Hospital Stay (HOSPITAL_BASED_OUTPATIENT_CLINIC_OR_DEPARTMENT_OTHER): Payer: Medicare Other | Admitting: Hematology and Oncology

## 2020-07-07 ENCOUNTER — Inpatient Hospital Stay: Payer: Medicare Other | Attending: Hematology and Oncology

## 2020-07-07 DIAGNOSIS — Z7902 Long term (current) use of antithrombotics/antiplatelets: Secondary | ICD-10-CM | POA: Diagnosis not present

## 2020-07-07 DIAGNOSIS — I825Z2 Chronic embolism and thrombosis of unspecified deep veins of left distal lower extremity: Secondary | ICD-10-CM

## 2020-07-07 DIAGNOSIS — C55 Malignant neoplasm of uterus, part unspecified: Secondary | ICD-10-CM

## 2020-07-07 DIAGNOSIS — C7951 Secondary malignant neoplasm of bone: Secondary | ICD-10-CM | POA: Diagnosis not present

## 2020-07-07 DIAGNOSIS — K5730 Diverticulosis of large intestine without perforation or abscess without bleeding: Secondary | ICD-10-CM | POA: Insufficient documentation

## 2020-07-07 DIAGNOSIS — Z5112 Encounter for antineoplastic immunotherapy: Secondary | ICD-10-CM | POA: Insufficient documentation

## 2020-07-07 DIAGNOSIS — G893 Neoplasm related pain (acute) (chronic): Secondary | ICD-10-CM | POA: Diagnosis not present

## 2020-07-07 DIAGNOSIS — I700 Atherosclerosis of aorta: Secondary | ICD-10-CM | POA: Insufficient documentation

## 2020-07-07 DIAGNOSIS — Z791 Long term (current) use of non-steroidal anti-inflammatories (NSAID): Secondary | ICD-10-CM | POA: Insufficient documentation

## 2020-07-07 DIAGNOSIS — C541 Malignant neoplasm of endometrium: Secondary | ICD-10-CM | POA: Diagnosis present

## 2020-07-07 DIAGNOSIS — L03116 Cellulitis of left lower limb: Secondary | ICD-10-CM | POA: Insufficient documentation

## 2020-07-07 DIAGNOSIS — Z79899 Other long term (current) drug therapy: Secondary | ICD-10-CM | POA: Insufficient documentation

## 2020-07-07 DIAGNOSIS — Z7901 Long term (current) use of anticoagulants: Secondary | ICD-10-CM | POA: Insufficient documentation

## 2020-07-07 DIAGNOSIS — E039 Hypothyroidism, unspecified: Secondary | ICD-10-CM

## 2020-07-07 DIAGNOSIS — Z7189 Other specified counseling: Secondary | ICD-10-CM

## 2020-07-07 DIAGNOSIS — C774 Secondary and unspecified malignant neoplasm of inguinal and lower limb lymph nodes: Secondary | ICD-10-CM

## 2020-07-07 DIAGNOSIS — C801 Malignant (primary) neoplasm, unspecified: Secondary | ICD-10-CM

## 2020-07-07 LAB — CBC WITH DIFFERENTIAL/PLATELET
Abs Immature Granulocytes: 0.02 10*3/uL (ref 0.00–0.07)
Basophils Absolute: 0 10*3/uL (ref 0.0–0.1)
Basophils Relative: 1 %
Eosinophils Absolute: 0.1 10*3/uL (ref 0.0–0.5)
Eosinophils Relative: 2 %
HCT: 35.9 % — ABNORMAL LOW (ref 36.0–46.0)
Hemoglobin: 11.6 g/dL — ABNORMAL LOW (ref 12.0–15.0)
Immature Granulocytes: 1 %
Lymphocytes Relative: 21 %
Lymphs Abs: 0.8 10*3/uL (ref 0.7–4.0)
MCH: 30 pg (ref 26.0–34.0)
MCHC: 32.3 g/dL (ref 30.0–36.0)
MCV: 92.8 fL (ref 80.0–100.0)
Monocytes Absolute: 0.4 10*3/uL (ref 0.1–1.0)
Monocytes Relative: 11 %
Neutro Abs: 2.4 10*3/uL (ref 1.7–7.7)
Neutrophils Relative %: 64 %
Platelets: 208 10*3/uL (ref 150–400)
RBC: 3.87 MIL/uL (ref 3.87–5.11)
RDW: 13.5 % (ref 11.5–15.5)
WBC: 3.7 10*3/uL — ABNORMAL LOW (ref 4.0–10.5)
nRBC: 0 % (ref 0.0–0.2)

## 2020-07-07 LAB — COMPREHENSIVE METABOLIC PANEL
ALT: 7 U/L (ref 0–44)
AST: 13 U/L — ABNORMAL LOW (ref 15–41)
Albumin: 3.5 g/dL (ref 3.5–5.0)
Alkaline Phosphatase: 116 U/L (ref 38–126)
Anion gap: 6 (ref 5–15)
BUN: 12 mg/dL (ref 8–23)
CO2: 27 mmol/L (ref 22–32)
Calcium: 9.2 mg/dL (ref 8.9–10.3)
Chloride: 107 mmol/L (ref 98–111)
Creatinine, Ser: 0.65 mg/dL (ref 0.44–1.00)
GFR, Estimated: >60 mL/min (ref >60)
Glucose, Bld: 94 mg/dL (ref 70–99)
Potassium: 3.5 mmol/L (ref 3.5–5.1)
Sodium: 140 mmol/L (ref 135–145)
Total Bilirubin: 0.6 mg/dL (ref 0.3–1.2)
Total Protein: 7 g/dL (ref 6.5–8.1)

## 2020-07-07 LAB — TSH: TSH: 1.975 u[IU]/mL (ref 0.308–3.960)

## 2020-07-07 MED ORDER — SODIUM CHLORIDE 0.9 % IV SOLN
200.0000 mg | Freq: Once | INTRAVENOUS | Status: AC
  Administered 2020-07-07: 200 mg via INTRAVENOUS
  Filled 2020-07-07: qty 8

## 2020-07-07 MED ORDER — SODIUM CHLORIDE 0.9% FLUSH
10.0000 mL | INTRAVENOUS | Status: DC | PRN
  Administered 2020-07-07: 10 mL
  Filled 2020-07-07: qty 10

## 2020-07-07 MED ORDER — HEPARIN SOD (PORK) LOCK FLUSH 100 UNIT/ML IV SOLN
500.0000 [IU] | Freq: Once | INTRAVENOUS | Status: AC | PRN
  Administered 2020-07-07: 500 [IU]
  Filled 2020-07-07: qty 5

## 2020-07-07 MED ORDER — SODIUM CHLORIDE 0.9 % IV SOLN
Freq: Once | INTRAVENOUS | Status: AC
Start: 2020-07-07 — End: 2020-07-07
  Filled 2020-07-07: qty 250

## 2020-07-07 MED ORDER — SODIUM CHLORIDE 0.9% FLUSH
10.0000 mL | Freq: Once | INTRAVENOUS | Status: AC
  Administered 2020-07-07: 10 mL
  Filled 2020-07-07: qty 10

## 2020-07-07 NOTE — Assessment & Plan Note (Signed)
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 

## 2020-07-07 NOTE — Patient Instructions (Signed)
[facility]  Discharge Instructions: Thank you for choosing [facility] to provide your oncology and hematology care.   If you have a lab appointment with the Cancer Center, please go directly to the Cancer Center and check in at the registration area.   Wear comfortable clothing and clothing appropriate for easy access to any Portacath or PICC line.   We strive to give you quality time with your provider. You may need to reschedule your appointment if you arrive late (15 or more minutes).  Arriving late affects you and other patients whose appointments are after yours.  Also, if you miss three or more appointments without notifying the office, you may be dismissed from the clinic at the provider's discretion.      For prescription refill requests, have your pharmacy contact our office and allow 72 hours for refills to be completed.    Today you received the following chemotherapy and/or immunotherapy agents: keytruda      To help prevent nausea and vomiting after your treatment, we encourage you to take your nausea medication as directed.  BELOW ARE SYMPTOMS THAT SHOULD BE REPORTED IMMEDIATELY: *FEVER GREATER THAN 100.4 F (38 C) OR HIGHER *CHILLS OR SWEATING *NAUSEA AND VOMITING THAT IS NOT CONTROLLED WITH YOUR NAUSEA MEDICATION *UNUSUAL SHORTNESS OF BREATH *UNUSUAL BRUISING OR BLEEDING *URINARY PROBLEMS (pain or burning when urinating, or frequent urination) *BOWEL PROBLEMS (unusual diarrhea, constipation, pain near the anus) TENDERNESS IN MOUTH AND THROAT WITH OR WITHOUT PRESENCE OF ULCERS (sore throat, sores in mouth, or a toothache) UNUSUAL RASH, SWELLING OR PAIN  UNUSUAL VAGINAL DISCHARGE OR ITCHING   Items with * indicate a potential emergency and should be followed up as soon as possible or go to the Emergency Department if any problems should occur.  Please show the CHEMOTHERAPY ALERT CARD or IMMUNOTHERAPY ALERT CARD at check-in to  the Emergency Department and triage nurse.  Should you have questions after your visit or need to cancel or reschedule your appointment, please contact [facility]  Dept: [phone]  and follow the prompts.  Office hours are 8:00 a.m. to 4:30 p.m. Monday - Friday. Please note that voicemails left after 4:00 p.m. may not be returned until the following business day.  We are closed weekends and major holidays. You have access to a nurse at all times for urgent questions. Please call the main number to the clinic Dept: [phone] and follow the prompts.   For any non-urgent questions, you may also contact your provider using MyChart. We now offer e-Visits for anyone 18 and older to request care online for non-urgent symptoms. For details visit [email or web address].   Also download the MyChart app! Go to the app store, search "MyChart", open the app, select [facility], and log in with your MyChart username and password.  Due to Covid, a mask is required upon entering the hospital/clinic. If you do not have a mask, one will be given to you upon arrival. For doctor visits, patients may have 1 support person aged 18 or older with them. For treatment visits, patients cannot have anyone with them due to current Covid guidelines and our immunocompromised population.   

## 2020-07-07 NOTE — Assessment & Plan Note (Signed)
She has less pain Plan to reduce the dose of morphine sulfate She does not need pain medicine refill today

## 2020-07-07 NOTE — Progress Notes (Signed)
[place] OFFICE PROGRESS NOTE  Patient Care Team: Nolene Ebbs, MD as PCP - General (Internal Medicine)  ASSESSMENT & PLAN:  Uterine cancer [facility]) Per previous discussion, the plan would be to continue pembrolizumab indefinitely I do not plan to repeat imaging study again for few months, probably around July  Cancer associated pain She has less pain Plan to reduce the dose of morphine sulfate She does not need pain medicine refill today  Left leg cellulitis She has chronic lower extremity edema which is stable She has completed a course of antibiotics Her examination is benign She will continue treatment as prescribed by her primary care doctor  Lower leg DVT (deep venous thromboembolism), chronic, left ([place]) She is taking both anticoagulation therapy and antiplatelet agent She has no recent bleeding She will continue her medications as directed   No orders of the defined types were placed in this encounter.   All questions were answered. The patient knows to call the clinic with any problems, questions or concerns. The total time spent in the appointment was 20 minutes encounter with patients including review of chart and various tests results, discussions about plan of care and coordination of care plan   [name], MD 07/07/2020 10:47 AM  INTERVAL HISTORY: Please see below for problem oriented charting. She returns for treatment and follow-up She is doing well Her chronic pain is stable She is prescribed another course of antibiotics for her leg No recent fever or chills The patient denies any recent signs or symptoms of bleeding such as spontaneous epistaxis, hematuria or hematochezia.   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  Uterus +/- tubes/ovaries, neoplastic, with left fallopian tube and ovary - INVASIVE ENDOMETRIOID CARCINOMA (1.5 CM), FIGO GRADE II, ARISING IN A BACKGROUND OF ATYPICAL COMPLEX HYPERPLASIA, CONFINED WITHIN INNER HALF OF THE MYOMETRIUM. - ENDOMETRIAL POLYP WITH ASSOCIATED ATYPICAL COMPLEX HYPERPLASIA. - MYOMETRIUM: LEIOMYOMATA. - CERVIX: BENIGN SQUAMOUS MUCOSA AND ENDOCERVICAL MUCOSA, NO DYSPLASIA OR MALIGNANCY. - LEFT OVARY: BENIGN OVARIAN TISSUE WITH ENDOSALPINGOSIS, NO EVIDENCE OF ATYPIA OR MALIGNANCY. - LEFT FALLOPIAN TUBE: NO HISTOLOGIC ABNORMALITIES. - PLEASE SEE ONCOLOGY TEMPLATE FOR DETAIL. 2. Ovary and fallopian tube, right - BENIGN OVARIAN TISSUE WITH ENDOSALPINGOSIS, NO ATYPIA OR MALIGNANCY. - BENIGN FALLOPIAN TUBAL TISSUE, NO PATHOLOGIC ABNORMALITIES. Microscopic Comment 1. UTERUS Specimen: Uterus, cervix, bilateral ovaries and fallopian tubes Procedure: Total hysterectomy and bilateral salpingo-oophorectomy Lymph node sampling performed: No Specimen integrity: Intact Maximum tumor size (cm): 1.5 cm, glass slide measurement Histologic type: Invasive endometrioid carcinoma Grade: FIGO grade II Myometrial invasion: 1 cm where myometrium is 2.3 cm in thickness Cervical stromal involvement: No Extent of involvement of other organs: No Lymph vascular invasion: Not identified Peritoneal washings: Negative ([id number]) Lymph nodes: number examined N/A; number positive N/A TNM code: pT1a, pNX 1 oFf 3IGO Stage (based on pathologic findings, needs clinical correlation): IA  Comments: Sections the endomyometrium away from the grossly identified endometrial polyp show an invasive FIGO grade II endometrioid carcinoma. The tumor is confined within inner half of the myometrium. No angiolymphatic invasion is identified. No cervical stromal involvement is identified. Sections of the grossly identified endometrial polyp show an endometrial polyp with associated atypical compacted hyperplasia  with no definitive evidence of carcinoma.   12/07/2017 Imaging   US venous Doppler Right: No  evidence of common femoral vein obstruction. Left: Findings consistent with acute deep vein thrombosis involving the left femoral vein, left proximal profunda vein, and left popliteal vein. Unable to adequately interrogate the common femoral and higher, or the calf secondary to significant edema and body habitus   12/07/2017 [facility] Admission   She presented to the ER and was diagnosed with acute DVT   01/18/2018 - 01/21/2018 Hospital Admission   She was admitted to the hospital for management of severe persistent DVT   01/18/2018 Imaging   US venous Doppler Right: No evidence of common femoral vein obstruction. Left: Findings consistent with acute deep vein thrombosis involving the left common femoral vein, and left popliteal vein.   01/19/2018 Surgery   Pre-operative Diagnosis: Subacute DVT with severe post thrombotic syndrome Post-operative diagnosis:  Same Surgeon:  Erlene Quan C. Donzetta Matters, MD Procedure Performed: 1.  Ultrasound-guided cannulation left small saphenous vein 2.  Left lower extremity and central venography 3.  Intravascular ultrasound of left popliteal, femoral, common femoral, external and common iliac veins and IVC 4.  Stent of left common and external iliac veins with 14 x 60 mm Vici 5.  Moderate sedation with fentanyl and Versed for 50 minutes  Indications: 75 year old female with a history of DVT in October now presents with persistent left lower extremity swelling and ultrasound demonstrating likely persistent DVT.  She has been on Xarelto at this time.  She is now indicated for venogram possible intervention.  Findings: Flow in the left lower extremity was stagnant throughout but by venogram all veins were patent.  There was a focal occlusive area approximately 2 cm in length at the common and external iliac vein junction at the hypogastric on the left.  After stenting and  ballooning we had a diameter of 12 millimeters in the stent and venogram demonstrated flow in the lower extremity veins were previously was stagnant and no further residual stenosis in the left common and external iliac vein junction.   04/12/2018 Imaging   US Venous Doppler Right: No evidence of common femoral vein obstruction. Left: There is no evidence of deep vein thrombosis in the lower extremity. However, portions of this examination were limited- see technologist comments above. Left groin: Large hypoechoic area with mixed echoes noted measuring nearly 10 cm. Possible  hematoma versus unknown etiology. Ultrasound characteristics of enlarged lymph nodes noted in the groin.      05/15/2018 Imaging   US Venous Doppler Right: No evidence of deep vein thrombosis in the lower extremity. No indirect evidence of obstruction proximal to the inguinal ligament. Left: No reflux was noted in the common femoral vein , femoral vein in the thigh, popliteal vein, great saphenous vein at the saphenofemoral junction, great saphenous vein at the proximal thigh, great saphenous vein at the mid thigh, great saphenous vein  at the distal thigh, great saphenous vein at the knee, origin of the small saphenous vein, proximal small saphenous vein, and mid small saphenous vein. There is no evidence of deep vein thrombosis in the lower extremity. There is no evidence of superficial venous thrombosis. No cystic structure found in the popliteal fossa. Unable to evaluate extension of common femoral vein obstruction proximal to the inguinal ligament.   06/01/2018 Imaging   1. 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 Additionally, there is lucency involving the medial sidewall of the left acetabulum with potential nondisplaced pathologic fracture. Further evaluation with contrast-enhanced pelvic  MRI could be performed as  clinically indicated. 2. The left pelvic arterial and venous system is encased by this infiltrative left pelvic sidewall mass however while difficult to ascertain, the left external iliac venous stent appears patent.   06/18/2018 Pathology Results   Lymph node for lymphoma, Left Inguinal - METASTATIC ADENOCARCINOMA, SEE COMMENT. Microscopic Comment Immunohistochemistry is positive for cytokeratin 7, PAX8, ER, and PR. Cytokeratin 5/6,and p63 are negative. The immunoprofile along with the patient's history are consistent with a gynecologic primary.   06/18/2018 Surgery   Pre-op Diagnosis: INGUINAL LYMPHADENOPATHY, PELVIC MASS     Procedure(s): EXCISIONAL BIOPSY DEEP LEFT INGUINAL LYMPH NODE  Surgeon(s): Coralie Keens, MD    06/24/2018 Cancer Staging   Staging form: Corpus Uteri - Carcinoma and Carcinosarcoma, AJCC 8th Edition - Clinical: Stage IVB (cT1a, cN2, pM1) - Signed by [name], MD on 06/24/2018    Genetic Testing   Patient has genetic testing done for MMR on pathology from 06/18/2018. Results revealed patient has the following mutation(s): MMR: abnormal   06/29/2018 Procedure   Placement of a subcutaneous port device. Catheter tip at the SVC and right atrium junction.    Genetic Testing   Patient has genetic testing done for MSI on pathology from 06/18/2018. Results revealed patient has the following mutation(s): MSI: High   07/02/2018 PET scan   Previous hysterectomy, with asymmetric focus of hypermetabolic activity in the left vaginal cuff, suspicious for residual or recurrent carcinoma.  Large hypermetabolic soft tissue mass involving the left pelvic sidewall and acetabulum, consistent with metastatic disease.  No evidence metastatic disease within the abdomen, chest, or neck.   07/09/2018 Tumor Marker   Patient's tumor was tested for the following markers: CA-125 Results of the tumor marker test revealed 9   07/10/2018 - 08/24/2018 Chemotherapy   The patient  had carboplatin and taxol x 3 cycles   07/17/2018 Genetic Testing   Negative genetic testing on the common hereditary cancer panel.  The Common Hereditary Gene Panel offered by [facility] includes sequencing and/or deletion duplication testing of the following 48 genes: APC, ATM, AXIN2, BARD1, BMPR1A, BRCA1, BRCA2, BRIP1, CDH1, CDK4, CDKN2A (p14ARF), CDKN2A (p16INK4a), CHEK2, CTNNA1, DICER1, EPCAM (Deletion/duplication testing only), GREM1 (promoter region deletion/duplication testing only), KIT, MEN1, MLH1, MSH2, MSH3, MSH6, MUTYH, NBN, NF1, NHTL1, PALB2, PDGFRA, PMS2, POLD1, POLE, PTEN, RAD50, RAD51C, RAD51D, RNF43, SDHB, SDHC, SDHD, SMAD4, SMARCA4. STK11, TP53, TSC1, TSC2, and VHL.  The following genes were evaluated for sequence changes only: SDHA and HOXB13 c.251G>A variant only. The report date is Jul 17, 2018.    10/03/2018 Imaging   CT abdomen and pelvis 1.  No acute intra-abdominal process. 2. Grossly unchanged left pelvic sidewall mass with osseous involvement of the medial acetabulum. Progressive mild displacement of the associated comminuted pathologic fracture involving the right acetabulum and puboacetabular junction.  3. New venous stents extending from the left common iliac vein origin to the proximal left common femoral vein. The stents are patent.   11/06/2018 -  Chemotherapy   The patient had pembrolizumab for chemotherapy treatment.     01/28/2019 Imaging   1. No substantial interval change in exam. 2. Interval development of mild fullness in the left intrarenal collecting system and ureter without overt hydronephrosis at this time. 3. Abnormal soft tissue along the left pelvic sidewall has decreased slightly in the interval. 4. Similar appearance of ill-defined fascial planes in the pelvis with some peritoneal thickening along the right pelvic sidewall and potentially involving the sigmoid mesocolon. 5. No  substantial ascites.   05/03/2019 Imaging   1. Stable mild left pelvic  sidewall soft tissue density. No new or progressive disease identified within the abdomen or pelvis.  2. Colonic diverticulosis. No radiographic evidence of diverticulitis.   Aortic Atherosclerosis (ICD10-I70.0).   09/09/2019 Imaging   1. No change in appearance of soft tissue thickening along the LEFT pelvic sidewall adjacent to chronic LEFT acetabular fracture. 2. Mild asymmetry of the bladder wall favoring the LEFT bladder wall, not well assessed. Similar accounting for variable degrees of distension on prior studies potentially related to prior radiation, attention on follow-up. 3. Signs of venous stenting in the LEFT hemipelvis with LEFT lower extremity muscular atrophy and mild stranding with similar appearance. Signs of colonic diverticulosis and diverticular disease without change.   02/24/2020 Imaging   1. Unchanged appearance of the pelvis as detailed below. 2. Unchanged soft tissue thickening of the left pelvic sidewall. 3. 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 4. No evidence discrete mass or lymphadenopathy nor metastatic disease in the abdomen or pelvis. 5. Status post hysterectomy and cholecystectomy. 6. Left common iliac vein stent. 7. Pancolonic diverticulosis.     Metastasis to lymph nodes ([place])  06/23/2018 Initial Diagnosis   Metastasis to lymph nodes ([place])   07/10/2018 - 08/24/2018 Chemotherapy   The patient had palonosetron (ALOXI) injection 0.25 mg, 0.25 mg, Intravenous,  Once, 3 of 6 cycles Administration: 0.25 mg (07/10/2018), 0.25 mg (07/31/2018), 0.25 mg (08/24/2018) CARBOplatin (PARAPLATIN) 480 mg in sodium chloride 0.9 % 250 mL chemo infusion, 480 mg (100 % of original dose 482.5 mg), Intravenous,  Once, 3 of 6 cycles Dose modification: 482.5 mg (original dose 482.5 mg, Cycle 1) Administration: 480 mg (07/10/2018), 480 mg (07/31/2018), 480 mg (08/24/2018) PACLitaxel (TAXOL) 276  mg in sodium chloride 0.9 % 250 mL chemo infusion (> 38m/m2), 140 mg/m2 = 276 mg (80 % of original dose 175 mg/m2), Intravenous,  Once, 3 of 6 cycles Dose modification: 140 mg/m2 (80 % of original dose 175 mg/m2, Cycle 1, Reason: Dose Not Tolerated) Administration: 276 mg (07/10/2018), 276 mg (07/31/2018), 276 mg (08/24/2018) fosaprepitant (EMEND) 150 mg, dexamethasone (DECADRON) 12 mg in sodium chloride 0.9 % 145 mL IVPB, , Intravenous,  Once, 3 of 6 cycles Administration:  (07/10/2018),  (07/31/2018),  (08/24/2018)  for chemotherapy treatment.    11/06/2018 -  Chemotherapy   The patient had pembrolizumab for chemotherapy treatment.     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  06/24/2018 Initial Diagnosis   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   07/10/2018 - 08/24/2018 Chemotherapy   The patient had palonosetron (ALOXI) injection 0.25 mg, 0.25 mg, Intravenous,  Once, 3 of 6 cycles Administration: 0.25 mg (07/10/2018), 0.25 mg (07/31/2018), 0.25 mg (08/24/2018) CARBOplatin (PARAPLATIN) 480 mg in sodium chloride 0.9 % 250 mL chemo infusion, 480 mg (100 % of original dose 482.5 mg), Intravenous,  Once, 3 of 6 cycles Dose modification: 482.5 mg (original dose 482.5 mg, Cycle 1) Administration: 480 mg (07/10/2018), 480 mg (07/31/2018), 480 mg (08/24/2018) PACLitaxel (TAXOL) 276 mg in sodium chloride 0.9 % 250 mL chemo infusion (> 826mm2), 140 mg/m2 = 276 mg (80 % of original dose 175 mg/m2), Intravenous,  Once, 3 of 6 cycles Dose modification: 140 mg/m2 (80 % of original dose 175 mg/m2, Cycle 1, Reason: Dose Not Tolerated) Administration: 276 mg (07/10/2018), 276 mg (07/31/2018), 276 mg (08/24/2018) fosaprepitant (EMEND) 150 mg, dexamethasone (DECADRON) 12 mg in  sodium chloride 0.9 % 145 mL IVPB, , Intravenous,  Once, 3 of 6 cycles Administration:  (07/10/2018),  (07/31/2018),  (08/24/2018)  for chemotherapy treatment.    11/06/2018 -  Chemotherapy   The patient had pembrolizumab for chemotherapy treatment.     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       REVIEW OF SYSTEMS:   Constitutional: Denies fevers, chills or abnormal weight loss Eyes: Denies blurriness of vision Ears, nose, mouth, throat, and face: Denies mucositis or sore throat Respiratory: Denies cough, dyspnea or wheezes Cardiovascular: Denies palpitation, chest discomfort  Gastrointestinal:  Denies nausea, heartburn or change in bowel habits Skin: Denies abnormal skin rashes Lymphatics: Denies new lymphadenopathy or easy bruising Neurological:Denies numbness, tingling or new weaknesses Behavioral/Psych: Mood is stable, no new changes  All other systems were reviewed with the patient and are negative.  I have reviewed the past medical history, past surgical history, social history and family history with the patient and they are unchanged from previous note.  ALLERGIES:  has No Known Allergies.  MEDICATIONS:  Current Outpatient Medications  Medication Sig Dispense Refill  . apixaban (ELIQUIS) 2.5 MG TABS tablet Take by mouth 2 (two) times daily.    . diclofenac sodium (VOLTAREN) 1 % GEL APPLY 4GRAMS 4 TIMES A DAY AS NEEDED FOR PAINS    . doxycycline (VIBRAMYCIN) 100 MG capsule Take 1 capsule (100 mg total) by mouth 2 (two) times daily. 20 capsule 0  . gabapentin (NEURONTIN) 300 MG capsule Take 300 mg by mouth 2 (two) times daily.    . methadone (DOLOPHINE) 10 MG tablet Take 1 tablet (10 mg total) by mouth every 12 (twelve) hours. 60 tablet 0  . morphine (MSIR) 15 MG tablet Take 1 tablet (15 mg total) by mouth every 6 (six) hours as needed for severe pain. 60 tablet 0  . Olopatadine HCl 0.2 % SOLN Place 1 drop into both eyes daily.     No current facility-administered medications for this visit.     PHYSICAL EXAMINATION: ECOG PERFORMANCE STATUS: 1 - Symptomatic but completely ambulatory  Vitals:   07/07/20 1038  BP: 138/74  Pulse: 92  Resp: 18  Temp: 97.7 F (36.5 C)  SpO2: 100%   Filed Weights   07/07/20 1038  Weight: 224 lb 3.2 oz (101.7 kg)    GENERAL:alert, no distress and comfortable SKIN: skin color, texture, turgor are normal, no rashes or significant lesions EYES: normal, Conjunctiva are pink and non-injected, sclera clear OROPHARYNX:no exudate, no erythema and lips, buccal mucosa, and tongue normal  NECK: supple, thyroid normal size, non-tender, without nodularity LYMPH:  no palpable lymphadenopathy in the cervical, axillary or inguinal LUNGS: clear to auscultation and percussion with normal breathing effort HEART: regular rate & rhythm and no murmurs with significant chronic lower extremity edema, stable in appearance ABDOMEN:abdomen soft, non-tender and normal bowel sounds Musculoskeletal:no cyanosis of digits and no clubbing  NEURO: alert & oriented x 3 with fluent speech, no focal motor/sensory deficits  LABORATORY DATA:  I have reviewed the data as listed    Component Value Date/Time   NA 140 06/16/2020 1115   K 3.9 06/16/2020 1115   CL 105 06/16/2020 1115   CO2 24 06/16/2020 1115   GLUCOSE 94 06/16/2020 1115   BUN 15 06/16/2020 1115  CREATININE 0.70 06/16/2020 1115   CREATININE 0.66 12/24/2019 1023   CALCIUM 9.2 06/16/2020 1115   PROT 7.4 06/16/2020 1115   ALBUMIN 3.8 06/16/2020 1115   AST 13 (L) 06/16/2020 1115   AST 11 (L) 12/24/2019 1023   ALT 10 06/16/2020 1115   ALT 7 12/24/2019 1023   ALKPHOS 140 (H) 06/16/2020 1115   BILITOT 0.7 06/16/2020 1115   BILITOT 0.6 12/24/2019 1023   GFRNONAA >60 06/16/2020 1115   GFRNONAA >60 12/24/2019 1023   GFRAA >60 11/12/2019 1222    No results found for: SPEP, UPEP  Lab Results  Component Value Date   WBC 3.7 (L) 07/07/2020   NEUTROABS 2.4 07/07/2020   HGB 11.6 (L) 07/07/2020   HCT 35.9  (L) 07/07/2020   MCV 92.8 07/07/2020   PLT 208 07/07/2020      Chemistry      Component Value Date/Time   NA 140 06/16/2020 1115   K 3.9 06/16/2020 1115   CL 105 06/16/2020 1115   CO2 24 06/16/2020 1115   BUN 15 06/16/2020 1115   CREATININE 0.70 06/16/2020 1115   CREATININE 0.66 12/24/2019 1023      Component Value Date/Time   CALCIUM 9.2 06/16/2020 1115   ALKPHOS 140 (H) 06/16/2020 1115   AST 13 (L) 06/16/2020 1115   AST 11 (L) 12/24/2019 1023   ALT 10 06/16/2020 1115   ALT 7 12/24/2019 1023   BILITOT 0.7 06/16/2020 1115   BILITOT 0.6 12/24/2019 1023       RADIOGRAPHIC STUDIES: I have personally reviewed the radiological images as listed and agreed with the findings in the report. VAS [place] LOWER EXTREMITY VENOUS (DVT) ([facility] and [facility] 7a-7p)  Result Date: 06/15/2020  Lower Venous DVT Study Patient Name:  [name] [facility]  Date of Exam:   06/14/2020 Medical Rec #: [id number]       Accession #:    [id number] Date of Birth: [birth year]        Patient Gender: F Patient Age:   41Y Exam Location:  [facility] Procedure:      VAS [place] LOWER EXTREMITY VENOUS (DVT) Referring Phys: [id number] LAURA A MURPHY --------------------------------------------------------------------------------  Indications: Edema, Erythema, and history of DVT.  Limitations: Body habitus, depth of vessels, clothing restriction, patient unable to lie flat and poor ultrasound/tissue interface. Comparison Study: 04/12/2018- left lower extremity venous duplex- age                   indeterminate DVT left CFV and proximal FV Performing Technologist: Maudry Mayhew MHA, RDMS, RVT, RDCS  Examination Guidelines: A complete evaluation includes B-mode imaging, spectral Doppler, color Doppler, and power Doppler as needed of all accessible portions of each vessel. Bilateral testing is considered an integral part of a complete examination. Limited examinations for reoccurring indications may be performed as noted. The reflux portion of  the exam is performed with the patient in reverse Trendelenburg.  Right Technical Findings: Not visualized segments include CFV.  +---------+---------------+---------+-----------+----------+--------------+ LEFT     CompressibilityPhasicitySpontaneityPropertiesThrombus Aging +---------+---------------+---------+-----------+----------+--------------+ FV Prox  Full                    Yes        patent                   +---------+---------------+---------+-----------+----------+--------------+ FV Mid   Full                    Yes  patent                   +---------+---------------+---------+-----------+----------+--------------+ FV DistalFull                    Yes        patent                   +---------+---------------+---------+-----------+----------+--------------+ POP      Full           Yes      Yes        patent                   +---------+---------------+---------+-----------+----------+--------------+ PTV                              Yes        patent                   +---------+---------------+---------+-----------+----------+--------------+ PERO                             Yes        patent                   +---------+---------------+---------+-----------+----------+--------------+   Left Technical Findings: Not visualized segments include CFV, SFJ, PFV.   Summary: LEFT: - There is no evidence of deep vein thrombosis in the lower extremity. However, portions of this examination were limited- see technologist comments above.  - No cystic structure found in the popliteal fossa.  *See table(s) above for measurements and observations. Electronically signed by Jamelle Haring on 06/15/2020 at 3:38:20 PM.    Final

## 2020-07-07 NOTE — Assessment & Plan Note (Signed)
She has chronic lower extremity edema which is stable She has completed a course of antibiotics Her examination is benign She will continue treatment as prescribed by her primary care doctor

## 2020-07-07 NOTE — Assessment & Plan Note (Signed)
She is taking both anticoagulation therapy and antiplatelet agent She has no recent bleeding She will continue her medications as directed  

## 2020-07-28 ENCOUNTER — Other Ambulatory Visit: Payer: Medicare Other

## 2020-07-28 ENCOUNTER — Other Ambulatory Visit: Payer: Self-pay

## 2020-07-28 ENCOUNTER — Inpatient Hospital Stay: Payer: Medicare Other | Attending: Hematology and Oncology

## 2020-07-28 ENCOUNTER — Inpatient Hospital Stay: Payer: Medicare Other

## 2020-07-28 ENCOUNTER — Encounter: Payer: Self-pay | Admitting: Hematology and Oncology

## 2020-07-28 ENCOUNTER — Inpatient Hospital Stay (HOSPITAL_BASED_OUTPATIENT_CLINIC_OR_DEPARTMENT_OTHER): Payer: Medicare Other | Admitting: Hematology and Oncology

## 2020-07-28 VITALS — BP 118/58 | HR 83 | Temp 97.8°F | Resp 18 | Ht 61.0 in | Wt 218.4 lb

## 2020-07-28 DIAGNOSIS — C801 Malignant (primary) neoplasm, unspecified: Secondary | ICD-10-CM

## 2020-07-28 DIAGNOSIS — E039 Hypothyroidism, unspecified: Secondary | ICD-10-CM

## 2020-07-28 DIAGNOSIS — I825Z2 Chronic embolism and thrombosis of unspecified deep veins of left distal lower extremity: Secondary | ICD-10-CM | POA: Diagnosis not present

## 2020-07-28 DIAGNOSIS — C774 Secondary and unspecified malignant neoplasm of inguinal and lower limb lymph nodes: Secondary | ICD-10-CM

## 2020-07-28 DIAGNOSIS — C541 Malignant neoplasm of endometrium: Secondary | ICD-10-CM | POA: Diagnosis not present

## 2020-07-28 DIAGNOSIS — Z791 Long term (current) use of non-steroidal anti-inflammatories (NSAID): Secondary | ICD-10-CM | POA: Diagnosis not present

## 2020-07-28 DIAGNOSIS — K5730 Diverticulosis of large intestine without perforation or abscess without bleeding: Secondary | ICD-10-CM | POA: Diagnosis not present

## 2020-07-28 DIAGNOSIS — Z9221 Personal history of antineoplastic chemotherapy: Secondary | ICD-10-CM | POA: Insufficient documentation

## 2020-07-28 DIAGNOSIS — Z86718 Personal history of other venous thrombosis and embolism: Secondary | ICD-10-CM | POA: Diagnosis not present

## 2020-07-28 DIAGNOSIS — C55 Malignant neoplasm of uterus, part unspecified: Secondary | ICD-10-CM

## 2020-07-28 DIAGNOSIS — C7951 Secondary malignant neoplasm of bone: Secondary | ICD-10-CM

## 2020-07-28 DIAGNOSIS — G893 Neoplasm related pain (acute) (chronic): Secondary | ICD-10-CM | POA: Insufficient documentation

## 2020-07-28 DIAGNOSIS — Z7189 Other specified counseling: Secondary | ICD-10-CM

## 2020-07-28 DIAGNOSIS — Z7901 Long term (current) use of anticoagulants: Secondary | ICD-10-CM | POA: Insufficient documentation

## 2020-07-28 DIAGNOSIS — Z5112 Encounter for antineoplastic immunotherapy: Secondary | ICD-10-CM | POA: Insufficient documentation

## 2020-07-28 DIAGNOSIS — Z79899 Other long term (current) drug therapy: Secondary | ICD-10-CM | POA: Diagnosis not present

## 2020-07-28 LAB — CBC WITH DIFFERENTIAL/PLATELET
Abs Immature Granulocytes: 0.02 10*3/uL (ref 0.00–0.07)
Basophils Absolute: 0 10*3/uL (ref 0.0–0.1)
Basophils Relative: 1 %
Eosinophils Absolute: 0.1 10*3/uL (ref 0.0–0.5)
Eosinophils Relative: 1 %
HCT: 36.4 % (ref 36.0–46.0)
Hemoglobin: 11.9 g/dL — ABNORMAL LOW (ref 12.0–15.0)
Immature Granulocytes: 1 %
Lymphocytes Relative: 23 %
Lymphs Abs: 0.9 10*3/uL (ref 0.7–4.0)
MCH: 30.1 pg (ref 26.0–34.0)
MCHC: 32.7 g/dL (ref 30.0–36.0)
MCV: 91.9 fL (ref 80.0–100.0)
Monocytes Absolute: 0.5 10*3/uL (ref 0.1–1.0)
Monocytes Relative: 11 %
Neutro Abs: 2.6 10*3/uL (ref 1.7–7.7)
Neutrophils Relative %: 63 %
Platelets: 216 10*3/uL (ref 150–400)
RBC: 3.96 MIL/uL (ref 3.87–5.11)
RDW: 12.8 % (ref 11.5–15.5)
WBC: 4.1 10*3/uL (ref 4.0–10.5)
nRBC: 0 % (ref 0.0–0.2)

## 2020-07-28 LAB — COMPREHENSIVE METABOLIC PANEL
ALT: 8 U/L (ref 0–44)
AST: 11 U/L — ABNORMAL LOW (ref 15–41)
Albumin: 3.6 g/dL (ref 3.5–5.0)
Alkaline Phosphatase: 137 U/L — ABNORMAL HIGH (ref 38–126)
Anion gap: 9 (ref 5–15)
BUN: 14 mg/dL (ref 8–23)
CO2: 25 mmol/L (ref 22–32)
Calcium: 9.3 mg/dL (ref 8.9–10.3)
Chloride: 106 mmol/L (ref 98–111)
Creatinine, Ser: 0.68 mg/dL (ref 0.44–1.00)
GFR, Estimated: >60 mL/min (ref >60)
Glucose, Bld: 94 mg/dL (ref 70–99)
Potassium: 3.9 mmol/L (ref 3.5–5.1)
Sodium: 140 mmol/L (ref 135–145)
Total Bilirubin: 0.6 mg/dL (ref 0.3–1.2)
Total Protein: 7.4 g/dL (ref 6.5–8.1)

## 2020-07-28 LAB — TSH: TSH: 2.253 u[IU]/mL (ref 0.308–3.960)

## 2020-07-28 MED ORDER — SODIUM CHLORIDE 0.9% FLUSH
10.0000 mL | Freq: Once | INTRAVENOUS | Status: AC
  Administered 2020-07-28: 10 mL
  Filled 2020-07-28: qty 10

## 2020-07-28 MED ORDER — SODIUM CHLORIDE 0.9 % IV SOLN
200.0000 mg | Freq: Once | INTRAVENOUS | Status: AC
  Administered 2020-07-28: 200 mg via INTRAVENOUS
  Filled 2020-07-28: qty 8

## 2020-07-28 MED ORDER — LIDOCAINE-PRILOCAINE 2.5-2.5 % EX CREA: 1.0000 "application " | TOPICAL_CREAM | Freq: Every day | CUTANEOUS | 3 refills | Status: DC | PRN

## 2020-07-28 MED ORDER — SODIUM CHLORIDE 0.9 % IV SOLN
Freq: Once | INTRAVENOUS | Status: AC
  Filled 2020-07-28: qty 250

## 2020-07-28 NOTE — Patient Instructions (Signed)
[facility] Discharge Instructions for Patients Receiving Chemotherapy  Today you received the following chemotherapy agents:  Keytruda.  To help prevent nausea and vomiting after your treatment, we encourage you to take your nausea medication as directed.   If you develop nausea and vomiting that is not controlled by your nausea medication, call the clinic.   BELOW ARE SYMPTOMS THAT SHOULD BE REPORTED IMMEDIATELY:  *FEVER GREATER THAN 100.5 F  *CHILLS WITH OR WITHOUT FEVER  NAUSEA AND VOMITING THAT IS NOT CONTROLLED WITH YOUR NAUSEA MEDICATION  *UNUSUAL SHORTNESS OF BREATH  *UNUSUAL BRUISING OR BLEEDING  TENDERNESS IN MOUTH AND THROAT WITH OR WITHOUT PRESENCE OF ULCERS  *URINARY PROBLEMS  *BOWEL PROBLEMS  UNUSUAL RASH Items with * indicate a potential emergency and should be followed up as soon as possible.  Feel free to call the clinic should you have any questions or concerns. The clinic phone number is [phone].  Please show the CHEMO ALERT CARD at check-in to the Emergency Department and triage nurse.    

## 2020-07-28 NOTE — Assessment & Plan Note (Signed)
Per previous discussion, the plan would be to continue pembrolizumab indefinitely She has no side effects from treatment so far I plan to order CT imaging around mid July

## 2020-07-28 NOTE — Progress Notes (Signed)
[place] OFFICE PROGRESS NOTE  Patient Care Team: Nolene Ebbs, MD as PCP - General (Internal Medicine)  ASSESSMENT & PLAN:  Uterine cancer [facility]) Per previous discussion, the plan would be to continue pembrolizumab indefinitely She has no side effects from treatment so far I plan to order CT imaging around mid July  Cancer associated pain She has less pain She does not need pain medicine refill today  Lower leg DVT (deep venous thromboembolism), chronic, left ([place]) She is taking both anticoagulation therapy and antiplatelet agent She has no recent bleeding She will continue her medications as directed  No orders of the defined types were placed in this encounter.   All questions were answered. The patient knows to call the clinic with any problems, questions or concerns. The total time spent in the appointment was 20 minutes encounter with patients including review of chart and various tests results, discussions about plan of care and coordination of care plan   [name], MD 07/28/2020 10:46 AM  INTERVAL HISTORY: Please see below for problem oriented charting. 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 Uterus +/- tubes/ovaries, neoplastic, with left fallopian tube and ovary - INVASIVE ENDOMETRIOID CARCINOMA (1.5 CM), FIGO GRADE II, ARISING IN A BACKGROUND OF ATYPICAL COMPLEX HYPERPLASIA, CONFINED WITHIN INNER HALF OF THE MYOMETRIUM. - ENDOMETRIAL POLYP WITH ASSOCIATED ATYPICAL COMPLEX HYPERPLASIA. - MYOMETRIUM: LEIOMYOMATA. - CERVIX: BENIGN SQUAMOUS MUCOSA  AND ENDOCERVICAL MUCOSA, NO DYSPLASIA OR MALIGNANCY. - LEFT OVARY: BENIGN OVARIAN TISSUE WITH ENDOSALPINGOSIS, NO EVIDENCE OF ATYPIA OR MALIGNANCY. - LEFT FALLOPIAN TUBE: NO HISTOLOGIC ABNORMALITIES. - PLEASE SEE ONCOLOGY TEMPLATE FOR DETAIL. 2. Ovary and fallopian tube, right - BENIGN OVARIAN TISSUE WITH ENDOSALPINGOSIS, NO ATYPIA OR MALIGNANCY. - BENIGN FALLOPIAN TUBAL TISSUE, NO PATHOLOGIC ABNORMALITIES. Microscopic Comment 1. UTERUS Specimen: Uterus, cervix, bilateral ovaries and fallopian tubes Procedure: Total hysterectomy and bilateral salpingo-oophorectomy Lymph node sampling performed: No Specimen integrity: Intact Maximum tumor size (cm): 1.5 cm, glass slide measurement Histologic type: Invasive endometrioid carcinoma Grade: FIGO grade II Myometrial invasion: 1 cm where myometrium is 2.3 cm in thickness Cervical stromal involvement: No Extent of involvement of other organs: No Lymph vascular invasion: Not identified Peritoneal washings: Negative ([id number]) Lymph nodes: number examined N/A; number positive N/A TNM code: pT1a, pNX 1 oFf 3IGO Stage (based on pathologic findings, needs clinical correlation): IA  Comments: Sections the endomyometrium away from the grossly identified endometrial polyp show an invasive FIGO grade II endometrioid carcinoma. The tumor is confined within inner half of the myometrium. No angiolymphatic invasion is identified. No cervical stromal involvement is identified. Sections of the grossly identified endometrial polyp show an endometrial polyp with associated atypical compacted hyperplasia with no definitive evidence of carcinoma.    12/07/2017 Imaging   US venous Doppler Right: No evidence of common femoral vein obstruction. Left: Findings consistent with acute deep vein thrombosis involving the left femoral vein, left proximal profunda vein, and left popliteal vein. Unable to adequately interrogate the common femoral and higher, or the calf  secondary to significant edema and body habitus    12/07/2017 [facility] Admission   She presented to  the ER and was diagnosed with acute DVT    01/18/2018 - 01/21/2018 Hospital Admission   She was admitted to the hospital for management of severe persistent DVT    01/18/2018 Imaging   US venous Doppler Right: No evidence of common femoral vein obstruction. Left: Findings consistent with acute deep vein thrombosis involving the left common femoral vein, and left popliteal vein.    01/19/2018 Surgery   Pre-operative Diagnosis: Subacute DVT with severe post thrombotic syndrome Post-operative diagnosis:  Same Surgeon:  Erlene Quan C. Donzetta Matters, MD Procedure Performed: 1.  Ultrasound-guided cannulation left small saphenous vein 2.  Left lower extremity and central venography 3.  Intravascular ultrasound of left popliteal, femoral, common femoral, external and common iliac veins and IVC 4.  Stent of left common and external iliac veins with 14 x 60 mm Vici 5.  Moderate sedation with fentanyl and Versed for 50 minutes   Indications: 75 year old female with a history of DVT in October now presents with persistent left lower extremity swelling and ultrasound demonstrating likely persistent DVT.  She has been on Xarelto at this time.  She is now indicated for venogram possible intervention.   Findings: Flow in the left lower extremity was stagnant throughout but by venogram all veins were patent.  There was a focal occlusive area approximately 2 cm in length at the common and external iliac vein junction at the hypogastric on the left.  After stenting and ballooning we had a diameter of 12 millimeters in the stent and venogram demonstrated flow in the lower extremity veins were previously was stagnant and no further residual stenosis in the left common and external iliac vein junction.    04/12/2018 Imaging   US Venous Doppler Right: No evidence of common femoral vein obstruction. Left: There is no  evidence of deep vein thrombosis in the lower extremity. However, portions of this examination were limited- see technologist comments above. Left groin: Large hypoechoic area with mixed echoes noted measuring nearly 10 cm. Possible  hematoma versus unknown etiology. Ultrasound characteristics of enlarged lymph nodes noted in the groin.      05/15/2018 Imaging   US Venous Doppler Right: No evidence of deep vein thrombosis in the lower extremity. No indirect evidence of obstruction proximal to the inguinal ligament. Left: No reflux was noted in the common femoral vein , femoral vein in the thigh, popliteal vein, great saphenous vein at the saphenofemoral junction, great saphenous vein at the proximal thigh, great saphenous vein at the mid thigh, great saphenous vein  at the distal thigh, great saphenous vein at the knee, origin of the small saphenous vein, proximal small saphenous vein, and mid small saphenous vein. There is no evidence of deep vein thrombosis in the lower extremity. There is no evidence of superficial venous thrombosis. No cystic structure found in the popliteal fossa. Unable to evaluate extension of common femoral vein obstruction proximal to the inguinal ligament.    06/01/2018 Imaging   1. 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 Additionally, there is lucency involving the medial sidewall of the left acetabulum with potential nondisplaced pathologic fracture. Further evaluation with contrast-enhanced pelvic MRI could be performed as clinically indicated. 2. The left pelvic arterial and venous system is encased by this infiltrative left pelvic sidewall mass however while difficult to ascertain, the left external iliac venous stent appears patent.    06/18/2018 Pathology Results   Lymph node for lymphoma, Left Inguinal -  METASTATIC ADENOCARCINOMA, SEE COMMENT. Microscopic  Comment Immunohistochemistry is positive for cytokeratin 7, PAX8, ER, and PR. Cytokeratin 5/6,and p63 are negative. The immunoprofile along with the patient's history are consistent with a gynecologic primary.    06/18/2018 Surgery   Pre-op Diagnosis: INGUINAL LYMPHADENOPATHY, PELVIC MASS      Procedure(s): EXCISIONAL BIOPSY DEEP LEFT INGUINAL LYMPH NODE   Surgeon(s): Coralie Keens, MD      06/24/2018 Cancer Staging   Staging form: Corpus Uteri - Carcinoma and Carcinosarcoma, AJCC 8th Edition - Clinical: Stage IVB (cT1a, cN2, pM1) - Signed by [name], MD on 06/24/2018     Genetic Testing   Patient has genetic testing done for MMR on pathology from 06/18/2018. Results revealed patient has the following mutation(s): MMR: abnormal    06/29/2018 Procedure   Placement of a subcutaneous port device. Catheter tip at the SVC and right atrium junction.     Genetic Testing   Patient has genetic testing done for MSI on pathology from 06/18/2018. Results revealed patient has the following mutation(s): MSI: High    07/02/2018 PET scan   Previous hysterectomy, with asymmetric focus of hypermetabolic activity in the left vaginal cuff, suspicious for residual or recurrent carcinoma.   Large hypermetabolic soft tissue mass involving the left pelvic sidewall and acetabulum, consistent with metastatic disease.   No evidence metastatic disease within the abdomen, chest, or neck.    07/09/2018 Tumor Marker   Patient's tumor was tested for the following markers: CA-125 Results of the tumor marker test revealed 9    07/10/2018 - 08/24/2018 Chemotherapy   The patient had carboplatin and taxol x 3 cycles   07/17/2018 Genetic Testing   Negative genetic testing on the common hereditary cancer panel.  The Common Hereditary Gene Panel offered by [facility] includes sequencing and/or deletion duplication testing of the following 48 genes: APC, ATM, AXIN2, BARD1, BMPR1A, BRCA1, BRCA2, BRIP1, CDH1, CDK4,  CDKN2A (p14ARF), CDKN2A (p16INK4a), CHEK2, CTNNA1, DICER1, EPCAM (Deletion/duplication testing only), GREM1 (promoter region deletion/duplication testing only), KIT, MEN1, MLH1, MSH2, MSH3, MSH6, MUTYH, NBN, NF1, NHTL1, PALB2, PDGFRA, PMS2, POLD1, POLE, PTEN, RAD50, RAD51C, RAD51D, RNF43, SDHB, SDHC, SDHD, SMAD4, SMARCA4. STK11, TP53, TSC1, TSC2, and VHL.  The following genes were evaluated for sequence changes only: SDHA and HOXB13 c.251G>A variant only. The report date is Jul 17, 2018.     10/03/2018 Imaging   CT abdomen and pelvis 1.  No acute intra-abdominal process. 2. Grossly unchanged left pelvic sidewall mass with osseous involvement of the medial acetabulum. Progressive mild displacement of the associated comminuted pathologic fracture involving the right acetabulum and puboacetabular junction.  3. New venous stents extending from the left common iliac vein origin to the proximal left common femoral vein. The stents are patent.   11/06/2018 -  Chemotherapy   The patient had pembrolizumab for chemotherapy treatment.     01/28/2019 Imaging   1. No substantial interval change in exam. 2. Interval development of mild fullness in the left intrarenal collecting system and ureter without overt hydronephrosis at this time. 3. Abnormal soft tissue along the left pelvic sidewall has decreased slightly in the interval. 4. Similar appearance of ill-defined fascial planes in the pelvis with some peritoneal thickening along the right pelvic sidewall and potentially involving the sigmoid mesocolon. 5. No substantial ascites.   05/03/2019 Imaging   1. Stable mild left pelvic sidewall soft tissue density. No new or progressive disease identified within the abdomen or pelvis.  2. Colonic diverticulosis. No radiographic evidence of diverticulitis.  Aortic Atherosclerosis (ICD10-I70.0).   09/09/2019 Imaging   1. No change in appearance of soft tissue thickening along the LEFT pelvic sidewall adjacent  to chronic LEFT acetabular fracture. 2. Mild asymmetry of the bladder wall favoring the LEFT bladder wall, not well assessed. Similar accounting for variable degrees of distension on prior studies potentially related to prior radiation, attention on follow-up. 3. Signs of venous stenting in the LEFT hemipelvis with LEFT lower extremity muscular atrophy and mild stranding with similar appearance. Signs of colonic diverticulosis and diverticular disease without change.   02/24/2020 Imaging   1. Unchanged appearance of the pelvis as detailed below. 2. Unchanged soft tissue thickening of the left pelvic sidewall. 3. 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 4. No evidence discrete mass or lymphadenopathy nor metastatic disease in the abdomen or pelvis. 5. Status post hysterectomy and cholecystectomy. 6. Left common iliac vein stent. 7. Pancolonic diverticulosis.     Metastasis to lymph nodes ([place])  06/23/2018 Initial Diagnosis   Metastasis to lymph nodes ([place])    07/10/2018 - 08/24/2018 Chemotherapy   The patient had palonosetron (ALOXI) injection 0.25 mg, 0.25 mg, Intravenous,  Once, 3 of 6 cycles Administration: 0.25 mg (07/10/2018), 0.25 mg (07/31/2018), 0.25 mg (08/24/2018) CARBOplatin (PARAPLATIN) 480 mg in sodium chloride 0.9 % 250 mL chemo infusion, 480 mg (100 % of original dose 482.5 mg), Intravenous,  Once, 3 of 6 cycles Dose modification: 482.5 mg (original dose 482.5 mg, Cycle 1) Administration: 480 mg (07/10/2018), 480 mg (07/31/2018), 480 mg (08/24/2018) PACLitaxel (TAXOL) 276 mg in sodium chloride 0.9 % 250 mL chemo infusion (> 27m/m2), 140 mg/m2 = 276 mg (80 % of original dose 175 mg/m2), Intravenous,  Once, 3 of 6 cycles Dose modification: 140 mg/m2 (80 % of original dose 175 mg/m2, Cycle 1, Reason: Dose Not Tolerated) Administration: 276 mg (07/10/2018), 276 mg (07/31/2018), 276 mg  (08/24/2018) fosaprepitant (EMEND) 150 mg, dexamethasone (DECADRON) 12 mg in sodium chloride 0.9 % 145 mL IVPB, , Intravenous,  Once, 3 of 6 cycles Administration:  (07/10/2018),  (07/31/2018),  (08/24/2018)   for chemotherapy treatment.     11/06/2018 -  Chemotherapy   The patient had pembrolizumab for chemotherapy treatment.     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  06/[place] Initial Diagnosis   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    07/10/2018 - 08/24/2018 Chemotherapy   The patient had palonosetron (ALOXI) injection 0.25 mg, 0.25 mg, Intravenous,  Once, 3 of 6 cycles Administration: 0.25 mg (07/10/2018), 0.25 mg (07/31/2018), 0.25 mg (08/24/2018) CARBOplatin (PARAPLATIN) 480 mg in sodium chloride 0.9 % 250 mL chemo infusion, 480 mg (100 % of original dose 482.5 mg), Intravenous,  Once, 3 of 6 cycles Dose modification: 482.5 mg (original dose 482.5 mg, Cycle 1) Administration: 480 mg (07/10/2018), 480 mg (07/31/2018), 480 mg (08/24/2018) PACLitaxel (TAXOL) 276 mg in sodium chloride 0.9 % 250 mL chemo infusion (> 845mm2), 140 mg/m2 = 276 mg (80 % of original dose 175 mg/m2), Intravenous,  Once, 3 of 6 cycles Dose modification: 140 mg/m2 (80 % of original dose 175 mg/m2, Cycle 1, Reason: Dose Not Tolerated) Administration: 276 mg (07/10/2018), 276 mg (07/31/2018), 276 mg (08/24/2018) fosaprepitant (EMEND) 150 mg, dexamethasone (DECADRON) 12 mg in sodium chloride 0.9 % 145 mL IVPB, , Intravenous,  Once, 3 of 6 cycles Administration:  (07/10/2018),  (07/31/2018),  (08/24/2018)   for chemotherapy treatment.     11/06/2018 -  Chemotherapy  The patient had pembrolizumab for chemotherapy treatment.     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)    11/06/2018 -  Chemotherapy   The patient had pembrolizumab for chemotherapy treatment.       REVIEW OF SYSTEMS:    Constitutional: Denies fevers, chills or abnormal weight loss Eyes: Denies blurriness of vision Ears, nose, mouth, throat, and face: Denies mucositis or sore throat Respiratory: Denies cough, dyspnea or wheezes Cardiovascular: Denies palpitation, chest discomfort  Gastrointestinal:  Denies nausea, heartburn or change in bowel habits Skin: Denies abnormal skin rashes Lymphatics: Denies new lymphadenopathy or easy bruising Neurological:Denies numbness, tingling or new weaknesses Behavioral/Psych: Mood is stable, no new changes  All other systems were reviewed with the patient and are negative.  I have reviewed the past medical history, past surgical history, social history and family history with the patient and they are unchanged from previous note.  ALLERGIES:  has No Known Allergies.  MEDICATIONS:  Current Outpatient Medications  Medication Sig Dispense Refill   lidocaine-prilocaine (EMLA) cream Apply 1 application topically daily as needed. 30 g 3   apixaban (ELIQUIS) 2.5 MG TABS tablet Take by mouth 2 (two) times daily.     diclofenac sodium (VOLTAREN) 1 % GEL APPLY 4GRAMS 4 TIMES A DAY AS NEEDED FOR PAINS     gabapentin (NEURONTIN) 300 MG capsule Take 300 mg by mouth 2 (two) times daily.     methadone (DOLOPHINE) 10 MG tablet Take 1 tablet (10 mg total) by mouth every 12 (twelve) hours. 60 tablet 0   morphine (MSIR) 15 MG tablet Take 1 tablet (15 mg total) by mouth every 6 (six) hours as needed for severe pain. 60 tablet 0   Olopatadine HCl 0.2 % SOLN Place 1 drop into both eyes daily.     No current facility-administered medications for this visit.    PHYSICAL EXAMINATION: ECOG PERFORMANCE STATUS: 2 - Symptomatic, <50% confined to bed  Vitals:   07/28/20 1025  BP: (!) 118/58  Pulse: 83  Resp: 18  Temp: 97.8 F (36.6 C)  SpO2: 100%   Filed Weights   07/28/20 1025  Weight: 218 lb 6.4 oz (99.1 kg)    GENERAL:alert, no distress and comfortable SKIN: skin color,  texture, turgor are normal, no rashes or significant lesions.  Cellulitis has resolved EYES: normal, Conjunctiva are pink and non-injected, sclera clear OROPHARYNX:no exudate, no erythema and lips, buccal mucosa, and tongue normal  NECK: supple, thyroid normal size, non-tender, without nodularity LYMPH:  no palpable lymphadenopathy in the cervical, axillary or inguinal LUNGS: clear to auscultation and percussion with normal breathing effort HEART: regular rate & rhythm and no murmurs with stable, moderate bilateral lower extremity edema ABDOMEN:abdomen soft, non-tender and normal bowel sounds Musculoskeletal:no cyanosis of digits and no clubbing  NEURO: alert & oriented x 3 with fluent speech, no focal motor/sensory deficits  LABORATORY DATA:  I have reviewed the data as listed    Component Value Date/Time   NA 140 07/28/2020 0953   K 3.9 07/28/2020 0953   CL 106 07/28/2020 0953   CO2 25 07/28/2020 0953   GLUCOSE 94 07/28/2020 0953   BUN 14 07/28/2020 0953   CREATININE 0.68 07/28/2020 0953   CREATININE 0.66 12/24/2019 1023   CALCIUM 9.3 07/28/2020 0953   PROT 7.4 07/28/2020 0953   ALBUMIN 3.6 07/28/2020 0953   AST 11 (L) 07/28/2020 9509  AST 11 (L) 12/24/2019 1023   ALT 8 07/28/2020 0953   ALT 7 12/24/2019 1023   ALKPHOS 137 (H) 07/28/2020 0953   BILITOT 0.6 07/28/2020 0953   BILITOT 0.6 12/24/2019 1023   GFRNONAA >60 07/28/2020 0953   GFRNONAA >60 12/24/2019 1023   GFRAA >60 11/12/2019 1222    No results found for: SPEP, UPEP  Lab Results  Component Value Date   WBC 4.1 07/28/2020   NEUTROABS 2.6 07/28/2020   HGB 11.9 (L) 07/28/2020   HCT 36.4 07/28/2020   MCV 91.9 07/28/2020   PLT 216 07/28/2020      Chemistry      Component Value Date/Time   NA 140 07/28/2020 0953   K 3.9 07/28/2020 0953   CL 106 07/28/2020 0953   CO2 25 07/28/2020 0953   BUN 14 07/28/2020 0953   CREATININE 0.68 07/28/2020 0953   CREATININE 0.66 12/24/2019 1023      Component Value  Date/Time   CALCIUM 9.3 07/28/2020 0953   ALKPHOS 137 (H) 07/28/2020 0953   AST 11 (L) 07/28/2020 0953   AST 11 (L) 12/24/2019 1023   ALT 8 07/28/2020 0953   ALT 7 12/24/2019 1023   BILITOT 0.6 07/28/2020 0953   BILITOT 0.6 12/24/2019 1023

## 2020-07-28 NOTE — Assessment & Plan Note (Signed)
She has less pain She does not need pain medicine refill today

## 2020-07-28 NOTE — Assessment & Plan Note (Signed)
She is taking both anticoagulation therapy and antiplatelet agent She has no recent bleeding She will continue her medications as directed  

## 2020-08-18 ENCOUNTER — Inpatient Hospital Stay: Payer: Medicare Other

## 2020-08-18 ENCOUNTER — Other Ambulatory Visit: Payer: Self-pay

## 2020-08-18 ENCOUNTER — Inpatient Hospital Stay (HOSPITAL_BASED_OUTPATIENT_CLINIC_OR_DEPARTMENT_OTHER): Payer: Medicare Other | Admitting: Hematology and Oncology

## 2020-08-18 ENCOUNTER — Encounter: Payer: Self-pay | Admitting: Hematology and Oncology

## 2020-08-18 ENCOUNTER — Inpatient Hospital Stay: Payer: Medicare Other | Attending: Hematology and Oncology

## 2020-08-18 VITALS — BP 121/69 | HR 81 | Temp 97.8°F | Resp 18 | Ht 61.0 in | Wt 225.4 lb

## 2020-08-18 DIAGNOSIS — Z86718 Personal history of other venous thrombosis and embolism: Secondary | ICD-10-CM | POA: Diagnosis not present

## 2020-08-18 DIAGNOSIS — D61818 Other pancytopenia: Secondary | ICD-10-CM | POA: Diagnosis not present

## 2020-08-18 DIAGNOSIS — Z79899 Other long term (current) drug therapy: Secondary | ICD-10-CM | POA: Diagnosis not present

## 2020-08-18 DIAGNOSIS — Z5112 Encounter for antineoplastic immunotherapy: Secondary | ICD-10-CM | POA: Diagnosis not present

## 2020-08-18 DIAGNOSIS — C7951 Secondary malignant neoplasm of bone: Secondary | ICD-10-CM | POA: Insufficient documentation

## 2020-08-18 DIAGNOSIS — I825Z2 Chronic embolism and thrombosis of unspecified deep veins of left distal lower extremity: Secondary | ICD-10-CM

## 2020-08-18 DIAGNOSIS — C55 Malignant neoplasm of uterus, part unspecified: Secondary | ICD-10-CM

## 2020-08-18 DIAGNOSIS — K5730 Diverticulosis of large intestine without perforation or abscess without bleeding: Secondary | ICD-10-CM | POA: Diagnosis not present

## 2020-08-18 DIAGNOSIS — G893 Neoplasm related pain (acute) (chronic): Secondary | ICD-10-CM

## 2020-08-18 DIAGNOSIS — C541 Malignant neoplasm of endometrium: Secondary | ICD-10-CM | POA: Diagnosis not present

## 2020-08-18 DIAGNOSIS — Z923 Personal history of irradiation: Secondary | ICD-10-CM | POA: Diagnosis not present

## 2020-08-18 DIAGNOSIS — Z7901 Long term (current) use of anticoagulants: Secondary | ICD-10-CM | POA: Diagnosis not present

## 2020-08-18 DIAGNOSIS — Z7189 Other specified counseling: Secondary | ICD-10-CM

## 2020-08-18 DIAGNOSIS — R590 Localized enlarged lymph nodes: Secondary | ICD-10-CM | POA: Diagnosis not present

## 2020-08-18 DIAGNOSIS — E039 Hypothyroidism, unspecified: Secondary | ICD-10-CM

## 2020-08-18 DIAGNOSIS — C774 Secondary and unspecified malignant neoplasm of inguinal and lower limb lymph nodes: Secondary | ICD-10-CM

## 2020-08-18 DIAGNOSIS — C801 Malignant (primary) neoplasm, unspecified: Secondary | ICD-10-CM

## 2020-08-18 LAB — COMPREHENSIVE METABOLIC PANEL
ALT: 7 U/L (ref 0–44)
AST: 12 U/L — ABNORMAL LOW (ref 15–41)
Albumin: 3.5 g/dL (ref 3.5–5.0)
Alkaline Phosphatase: 138 U/L — ABNORMAL HIGH (ref 38–126)
Anion gap: 9 (ref 5–15)
BUN: 10 mg/dL (ref 8–23)
CO2: 24 mmol/L (ref 22–32)
Calcium: 9.1 mg/dL (ref 8.9–10.3)
Chloride: 107 mmol/L (ref 98–111)
Creatinine, Ser: 0.72 mg/dL (ref 0.44–1.00)
GFR, Estimated: >60 mL/min (ref >60)
Glucose, Bld: 85 mg/dL (ref 70–99)
Potassium: 3.7 mmol/L (ref 3.5–5.1)
Sodium: 140 mmol/L (ref 135–145)
Total Bilirubin: 0.5 mg/dL (ref 0.3–1.2)
Total Protein: 7.2 g/dL (ref 6.5–8.1)

## 2020-08-18 LAB — CBC WITH DIFFERENTIAL/PLATELET
Abs Immature Granulocytes: 0.02 10*3/uL (ref 0.00–0.07)
Basophils Absolute: 0 10*3/uL (ref 0.0–0.1)
Basophils Relative: 1 %
Eosinophils Absolute: 0.1 10*3/uL (ref 0.0–0.5)
Eosinophils Relative: 2 %
HCT: 35 % — ABNORMAL LOW (ref 36.0–46.0)
Hemoglobin: 11.6 g/dL — ABNORMAL LOW (ref 12.0–15.0)
Immature Granulocytes: 1 %
Lymphocytes Relative: 25 %
Lymphs Abs: 1 10*3/uL (ref 0.7–4.0)
MCH: 30.4 pg (ref 26.0–34.0)
MCHC: 33.1 g/dL (ref 30.0–36.0)
MCV: 91.9 fL (ref 80.0–100.0)
Monocytes Absolute: 0.4 10*3/uL (ref 0.1–1.0)
Monocytes Relative: 10 %
Neutro Abs: 2.4 10*3/uL (ref 1.7–7.7)
Neutrophils Relative %: 61 %
Platelets: 216 10*3/uL (ref 150–400)
RBC: 3.81 MIL/uL — ABNORMAL LOW (ref 3.87–5.11)
RDW: 13 % (ref 11.5–15.5)
WBC: 3.9 10*3/uL — ABNORMAL LOW (ref 4.0–10.5)
nRBC: 0 % (ref 0.0–0.2)

## 2020-08-18 LAB — TSH: TSH: 1.629 u[IU]/mL (ref 0.308–3.960)

## 2020-08-18 MED ORDER — SODIUM CHLORIDE 0.9% FLUSH
10.0000 mL | Freq: Once | INTRAVENOUS | Status: AC
  Administered 2020-08-18: 10 mL
  Filled 2020-08-18: qty 10

## 2020-08-18 MED ORDER — SODIUM CHLORIDE 0.9 % IV SOLN
200.0000 mg | Freq: Once | INTRAVENOUS | Status: AC
  Administered 2020-08-18: 200 mg via INTRAVENOUS
  Filled 2020-08-18: qty 8

## 2020-08-18 MED ORDER — SODIUM CHLORIDE 0.9 % IV SOLN
Freq: Once | INTRAVENOUS | Status: DC
  Filled 2020-08-18: qty 250

## 2020-08-18 NOTE — Assessment & Plan Note (Signed)
This is due to her treatment She is not symptomatic Observe 

## 2020-08-18 NOTE — Assessment & Plan Note (Signed)
She is taking both anticoagulation therapy and antiplatelet agent She has no recent bleeding She will continue her medications as directed  

## 2020-08-18 NOTE — Assessment & Plan Note (Signed)
She has less pain She does not need pain medicine refill today

## 2020-08-18 NOTE — Assessment & Plan Note (Signed)
Per previous discussion, the plan would be to continue pembrolizumab indefinitely She has no side effects from treatment so far I plan to order CT imaging around mid July

## 2020-08-18 NOTE — Progress Notes (Signed)
[place] OFFICE PROGRESS NOTE  Patient Care Team: Nolene Ebbs, MD as PCP - General (Internal Medicine)  ASSESSMENT & PLAN:  Uterine cancer [facility]) Per previous discussion, the plan would be to continue pembrolizumab indefinitely She has no side effects from treatment so far I plan to order CT imaging around mid July  Pancytopenia, acquired [facility]) This is due to her treatment She is not symptomatic Observe  Lower leg DVT (deep venous thromboembolism), chronic, left ([place]) She is taking both anticoagulation therapy and antiplatelet agent She has no recent bleeding She will continue her medications as directed  Cancer associated pain She has less pain She does not need pain medicine refill today  Orders Placed This Encounter  Procedures   CT ABDOMEN PELVIS W CONTRAST    Standing Status:   Future    Standing Expiration Date:   08/18/2021    Order Specific Question:   If indicated for the ordered procedure, I authorize the administration of contrast media per Radiology protocol    Answer:   Yes    Order Specific Question:   Preferred imaging location?    Answer:   [facility]    Order Specific Question:   Radiology Contrast Protocol - do NOT remove file path    Answer:   [email or web address]    All questions were answered. The patient knows to call the clinic with any problems, questions or concerns. The total time spent in the appointment was 20 minutes encounter with patients including review of chart and various tests results, discussions about plan of care and coordination of care plan   [name], MD 08/18/2020 12:03 PM  INTERVAL HISTORY: Please see below for problem oriented charting. 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 Uterus +/- tubes/ovaries, neoplastic, with left fallopian tube and ovary - INVASIVE ENDOMETRIOID CARCINOMA (1.5 CM), FIGO GRADE II, ARISING IN A BACKGROUND OF ATYPICAL COMPLEX HYPERPLASIA, CONFINED WITHIN INNER HALF OF THE MYOMETRIUM. - ENDOMETRIAL POLYP WITH ASSOCIATED ATYPICAL COMPLEX HYPERPLASIA. - MYOMETRIUM: LEIOMYOMATA. - CERVIX: BENIGN SQUAMOUS MUCOSA AND ENDOCERVICAL MUCOSA, NO DYSPLASIA OR MALIGNANCY. - LEFT OVARY: BENIGN OVARIAN TISSUE WITH ENDOSALPINGOSIS, NO EVIDENCE OF ATYPIA OR MALIGNANCY. - LEFT FALLOPIAN TUBE: NO HISTOLOGIC ABNORMALITIES. - PLEASE SEE ONCOLOGY TEMPLATE FOR DETAIL. 2. Ovary and fallopian tube, right - BENIGN OVARIAN TISSUE WITH ENDOSALPINGOSIS, NO ATYPIA OR MALIGNANCY. - BENIGN FALLOPIAN TUBAL TISSUE, NO PATHOLOGIC ABNORMALITIES. Microscopic Comment 1. UTERUS Specimen: Uterus, cervix, bilateral ovaries and fallopian tubes Procedure: Total hysterectomy and bilateral salpingo-oophorectomy Lymph node sampling performed: No Specimen integrity: Intact Maximum tumor size (cm): 1.5 cm, glass slide measurement Histologic type: Invasive endometrioid carcinoma Grade: FIGO grade II Myometrial invasion: 1 cm where myometrium is 2.3 cm in thickness Cervical stromal involvement: No Extent of involvement of other organs: No Lymph vascular invasion: Not identified Peritoneal washings: Negative ([id number]) Lymph nodes: number examined N/A; number positive N/A TNM code: pT1a, pNX 1 oFf 3IGO Stage (based on pathologic findings, needs clinical correlation): IA  Comments: Sections the endomyometrium away from the grossly identified endometrial polyp show an invasive FIGO grade II endometrioid carcinoma. The tumor is confined within inner half of  the myometrium. No  angiolymphatic invasion is identified. No cervical stromal involvement is identified. Sections of the grossly identified endometrial polyp show an endometrial polyp with associated atypical compacted hyperplasia with no definitive evidence of carcinoma.    12/07/2017 Imaging   US venous Doppler Right: No evidence of common femoral vein obstruction. Left: Findings consistent with acute deep vein thrombosis involving the left femoral vein, left proximal profunda vein, and left popliteal vein. Unable to adequately interrogate the common femoral and higher, or the calf secondary to significant edema and body habitus    12/07/2017 [facility] Admission   She presented to the ER and was diagnosed with acute DVT    01/18/2018 - 01/21/2018 Hospital Admission   She was admitted to the hospital for management of severe persistent DVT    01/18/2018 Imaging   US venous Doppler Right: No evidence of common femoral vein obstruction. Left: Findings consistent with acute deep vein thrombosis involving the left common femoral vein, and left popliteal vein.    01/19/2018 Surgery   Pre-operative Diagnosis: Subacute DVT with severe post thrombotic syndrome Post-operative diagnosis:  Same Surgeon:  Erlene Quan C. Donzetta Matters, MD Procedure Performed: 1.  Ultrasound-guided cannulation left small saphenous vein 2.  Left lower extremity and central venography 3.  Intravascular ultrasound of left popliteal, femoral, common femoral, external and common iliac veins and IVC 4.  Stent of left common and external iliac veins with 14 x 60 mm Vici 5.  Moderate sedation with fentanyl and Versed for 50 minutes   Indications: 75 year old female with a history of DVT in October now presents with persistent left lower extremity swelling and ultrasound demonstrating likely persistent DVT.  She has been on Xarelto at this time.  She is now indicated for venogram possible intervention.   Findings: Flow in the left lower extremity was  stagnant throughout but by venogram all veins were patent.  There was a focal occlusive area approximately 2 cm in length at the common and external iliac vein junction at the hypogastric on the left.  After stenting and ballooning we had a diameter of 12 millimeters in the stent and venogram demonstrated flow in the lower extremity veins were previously was stagnant and no further residual stenosis in the left common and external iliac vein junction.    04/12/2018 Imaging   US Venous Doppler Right: No evidence of common femoral vein obstruction. Left: There is no evidence of deep vein thrombosis in the lower extremity. However, portions of this examination were limited- see technologist comments above. Left groin: Large hypoechoic area with mixed echoes noted measuring nearly 10 cm. Possible  hematoma versus unknown etiology. Ultrasound characteristics of enlarged lymph nodes noted in the groin.      05/15/2018 Imaging   US Venous Doppler Right: No evidence of deep vein thrombosis in the lower extremity. No indirect evidence of obstruction proximal to the inguinal ligament. Left: No reflux was noted in the common femoral vein , femoral vein in the thigh, popliteal vein, great saphenous vein at the saphenofemoral junction, great saphenous vein at the proximal thigh, great saphenous vein at the mid thigh, great saphenous vein  at the distal thigh, great saphenous vein at the knee, origin of the small saphenous vein, proximal small saphenous vein, and mid small saphenous vein. There is no evidence of deep vein thrombosis in the lower extremity. There is no evidence of superficial venous thrombosis. No cystic structure found in the popliteal fossa. Unable to evaluate extension of common femoral  vein obstruction proximal to the inguinal ligament.    06/01/2018 Imaging   1. Infiltrative mass within the left pelvic sidewall measuring approximately 9.5 cm with associated pathologically enlarged left inguinal  lymph node. Additionally, there is lucency involving the medial sidewall of the left acetabulum with potential nondisplaced pathologic fracture. Further evaluation with contrast-enhanced pelvic MRI could be performed as clinically indicated. 2. The left pelvic arterial and venous system is encased by this infiltrative left pelvic sidewall mass however while difficult to ascertain, the left external iliac venous stent appears patent.    06/18/2018 Pathology Results   Lymph node for lymphoma, Left Inguinal - METASTATIC ADENOCARCINOMA, SEE COMMENT. Microscopic Comment Immunohistochemistry is positive for cytokeratin 7, PAX8, ER, and PR. Cytokeratin 5/6,and p63 are negative. The immunoprofile along with the patient's history are consistent with a gynecologic primary.    06/18/2018 Surgery   Pre-op Diagnosis: INGUINAL LYMPHADENOPATHY, PELVIC MASS      Procedure(s): EXCISIONAL BIOPSY DEEP LEFT INGUINAL LYMPH NODE   Surgeon(s): Coralie Keens, MD      06/24/2018 Cancer Staging   Staging form: Corpus Uteri - Carcinoma and Carcinosarcoma, AJCC 8th Edition - Clinical: Stage IVB (cT1a, cN2, pM1) - Signed by [name], MD on 06/24/2018     Genetic Testing   Patient has genetic testing done for MMR on pathology from 06/18/2018. Results revealed patient has the following mutation(s): MMR: abnormal    06/29/2018 Procedure   Placement of a subcutaneous port device. Catheter tip at the SVC and right atrium junction.     Genetic Testing   Patient has genetic testing done for MSI on pathology from 06/18/2018. Results revealed patient has the following mutation(s): MSI: High    07/02/2018 PET scan   Previous hysterectomy, with asymmetric focus of hypermetabolic activity in the left vaginal cuff, suspicious for residual or recurrent carcinoma.   Large hypermetabolic soft tissue mass involving the left pelvic sidewall and acetabulum, consistent with metastatic disease.   No evidence metastatic  disease within the abdomen, chest, or neck.    07/09/2018 Tumor Marker   Patient's tumor was tested for the following markers: CA-125 Results of the tumor marker test revealed 9    07/10/2018 - 08/24/2018 Chemotherapy   The patient had carboplatin and taxol x 3 cycles   07/17/2018 Genetic Testing   Negative genetic testing on the common hereditary cancer panel.  The Common Hereditary Gene Panel offered by [facility] includes sequencing and/or deletion duplication testing of the following 48 genes: APC, ATM, AXIN2, BARD1, BMPR1A, BRCA1, BRCA2, BRIP1, CDH1, CDK4, CDKN2A (p14ARF), CDKN2A (p16INK4a), CHEK2, CTNNA1, DICER1, EPCAM (Deletion/duplication testing only), GREM1 (promoter region deletion/duplication testing only), KIT, MEN1, MLH1, MSH2, MSH3, MSH6, MUTYH, NBN, NF1, NHTL1, PALB2, PDGFRA, PMS2, POLD1, POLE, PTEN, RAD50, RAD51C, RAD51D, RNF43, SDHB, SDHC, SDHD, SMAD4, SMARCA4. STK11, TP53, TSC1, TSC2, and VHL.  The following genes were evaluated for sequence changes only: SDHA and HOXB13 c.251G>A variant only. The report date is Jul 17, 2018.     10/03/2018 Imaging   CT abdomen and pelvis 1.  No acute intra-abdominal process. 2. Grossly unchanged left pelvic sidewall mass with osseous involvement of the medial acetabulum. Progressive mild displacement of the associated comminuted pathologic fracture involving the right acetabulum and puboacetabular junction.  3. New venous stents extending from the left common iliac vein origin to the proximal left common femoral vein. The stents are patent.   11/06/2018 -  Chemotherapy   The patient had pembrolizumab for chemotherapy treatment.     01/28/2019 Imaging  1. No substantial interval change in exam. 2. Interval development of mild fullness in the left intrarenal collecting system and ureter without overt hydronephrosis at this time. 3. Abnormal soft tissue along the left pelvic sidewall has decreased slightly in the interval. 4. Similar appearance  of ill-defined fascial planes in the pelvis with some peritoneal thickening along the right pelvic sidewall and potentially involving the sigmoid mesocolon. 5. No substantial ascites.   05/03/2019 Imaging   1. Stable mild left pelvic sidewall soft tissue density. No new or progressive disease identified within the abdomen or pelvis.  2. Colonic diverticulosis. No radiographic evidence of diverticulitis.   Aortic Atherosclerosis (ICD10-I70.0).   09/09/2019 Imaging   1. No change in appearance of soft tissue thickening along the LEFT pelvic sidewall adjacent to chronic LEFT acetabular fracture. 2. Mild asymmetry of the bladder wall favoring the LEFT bladder wall, not well assessed. Similar accounting for variable degrees of distension on prior studies potentially related to prior radiation, attention on follow-up. 3. Signs of venous stenting in the LEFT hemipelvis with LEFT lower extremity muscular atrophy and mild stranding with similar appearance. Signs of colonic diverticulosis and diverticular disease without change.   02/24/2020 Imaging   1. Unchanged appearance of the pelvis as detailed below. 2. Unchanged soft tissue thickening of the left pelvic sidewall. 3. 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 4. No evidence discrete mass or lymphadenopathy nor metastatic disease in the abdomen or pelvis. 5. Status post hysterectomy and cholecystectomy. 6. Left common iliac vein stent. 7. Pancolonic diverticulosis.     Metastasis to lymph nodes ([place])  06/23/2018 Initial Diagnosis   Metastasis to lymph nodes ([place])    07/10/2018 - 08/24/2018 Chemotherapy   The patient had palonosetron (ALOXI) injection 0.25 mg, 0.25 mg, Intravenous,  Once, 3 of 6 cycles Administration: 0.25 mg (07/10/2018), 0.25 mg (07/31/2018), 0.25 mg (08/24/2018) CARBOplatin (PARAPLATIN) 480 mg in sodium chloride 0.9 % 250 mL chemo  infusion, 480 mg (100 % of original dose 482.5 mg), Intravenous,  Once, 3 of 6 cycles Dose modification: 482.5 mg (original dose 482.5 mg, Cycle 1) Administration: 480 mg (07/10/2018), 480 mg (07/31/2018), 480 mg (08/24/2018) PACLitaxel (TAXOL) 276 mg in sodium chloride 0.9 % 250 mL chemo infusion (> 54m/m2), 140 mg/m2 = 276 mg (80 % of original dose 175 mg/m2), Intravenous,  Once, 3 of 6 cycles Dose modification: 140 mg/m2 (80 % of original dose 175 mg/m2, Cycle 1, Reason: Dose Not Tolerated) Administration: 276 mg (07/10/2018), 276 mg (07/31/2018), 276 mg (08/24/2018) fosaprepitant (EMEND) 150 mg, dexamethasone (DECADRON) 12 mg in sodium chloride 0.9 % 145 mL IVPB, , Intravenous,  Once, 3 of 6 cycles Administration:  (07/10/2018),  (07/31/2018),  (08/24/2018)   for chemotherapy treatment.     11/06/2018 -  Chemotherapy   The patient had pembrolizumab for chemotherapy treatment.     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 ([place]  06/24/2018 Initial Diagnosis   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 ([place]    07/10/2018 - 08/24/2018 Chemotherapy   The patient had palonosetron (ALOXI) injection 0.25 mg, 0.25 mg, Intravenous,  Once, 3 of 6 cycles Administration: 0.25 mg (07/10/2018), 0.25 mg (07/31/2018), 0.25 mg (08/24/2018) CARBOplatin (PARAPLATIN) 480 mg in sodium chloride 0.9 % 250 mL chemo infusion, 480 mg (100 % of original dose 482.5 mg), Intravenous,  Once, 3 of 6 cycles Dose modification: 482.5 mg (original dose 482.5 mg, Cycle 1) Administration: 480 mg (07/10/2018), 480 mg (07/31/2018),  480 mg (08/24/2018) PACLitaxel (TAXOL) 276 mg in sodium chloride 0.9 % 250 mL chemo infusion (> 50m/m2), 140 mg/m2 = 276 mg (80 % of original dose 175 mg/m2), Intravenous,  Once, 3 of 6 cycles Dose modification: 140 mg/m2 (80 % of original dose 175 mg/m2, Cycle 1, Reason: Dose Not Tolerated) Administration: 276 mg (07/10/2018), 276 mg (07/31/2018), 276 mg (08/24/2018) fosaprepitant (EMEND) 150 mg, dexamethasone (DECADRON) 12 mg in sodium chloride 0.9 % 145 mL IVPB,  , Intravenous,  Once, 3 of 6 cycles Administration:  (07/10/2018),  (07/31/2018),  (08/24/2018)   for chemotherapy treatment.     11/06/2018 -  Chemotherapy   The patient had pembrolizumab for chemotherapy treatment.     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       REVIEW OF SYSTEMS:   Constitutional: Denies fevers, chills or abnormal weight loss Eyes: Denies blurriness of vision Ears, nose, mouth, throat, and face: Denies mucositis or sore throat Respiratory: Denies cough, dyspnea or wheezes Cardiovascular: Denies palpitation, chest discomfort  Gastrointestinal:  Denies nausea, heartburn or change in bowel habits Skin: Denies abnormal skin rashes Lymphatics: Denies new lymphadenopathy or easy bruising Neurological:Denies numbness, tingling or new weaknesses Behavioral/Psych: Mood is stable, no new changes  All other systems were reviewed with the patient and are negative.  I have reviewed the past medical history, past surgical history, social history and family history with the patient and they are unchanged from previous note.  ALLERGIES:  has No Known Allergies.  MEDICATIONS:  Current Outpatient Medications  Medication Sig Dispense Refill   apixaban (ELIQUIS) 2.5 MG TABS tablet Take by mouth 2 (two) times daily.     diclofenac sodium (VOLTAREN) 1 % GEL APPLY 4GRAMS 4 TIMES A DAY AS NEEDED FOR PAINS     gabapentin (NEURONTIN) 300 MG capsule Take 300 mg by mouth 2 (two) times daily.     lidocaine-prilocaine (EMLA) cream Apply 1 application topically daily as needed. 30 g 3   methadone (DOLOPHINE) 10 MG tablet Take 1 tablet (10 mg total) by mouth every 12 (twelve) hours. 60 tablet 0   morphine (MSIR) 15 MG tablet Take 1 tablet  (15 mg total) by mouth every 6 (six) hours as needed for severe pain. 60 tablet 0   Olopatadine HCl 0.2 % SOLN Place 1 drop into both eyes daily.     No current facility-administered medications for this visit.    PHYSICAL EXAMINATION: ECOG PERFORMANCE STATUS: 1 - Symptomatic but completely ambulatory  Vitals:   08/18/20 1144  BP: 121/69  Pulse: 81  Resp: 18  Temp: 97.8 F (36.6 C)  SpO2: 100%   Filed Weights   08/18/20 1144  Weight: 225 lb 6.4 oz (102.2 kg)    GENERAL:alert, no distress and comfortable SKIN: skin color, texture, turgor are normal, no rashes or significant lesions EYES: normal, Conjunctiva are pink and non-injected, sclera clear OROPHARYNX:no exudate, no erythema and lips, buccal mucosa, and tongue normal  NECK: supple, thyroid normal size, non-tender, without nodularity LYMPH:  no palpable lymphadenopathy in the cervical, axillary or inguinal LUNGS: clear to auscultation and percussion with normal breathing effort HEART: regular rate & rhythm and no murmurs with stable bilateral lower extremity edema ABDOMEN:abdomen soft, non-tender and normal bowel sounds Musculoskeletal:no cyanosis of digits and no clubbing  NEURO: alert &  oriented x 3 with fluent speech, no focal motor/sensory deficits  LABORATORY DATA:  I have reviewed the data as listed    Component Value Date/Time   NA 140 07/28/2020 0953   K 3.9 07/28/2020 0953   CL 106 07/28/2020 0953   CO2 25 07/28/2020 0953   GLUCOSE 94 07/28/2020 0953   BUN 14 07/28/2020 0953   CREATININE 0.68 07/28/2020 0953   CREATININE 0.66 12/24/2019 1023   CALCIUM 9.3 07/28/2020 0953   PROT 7.4 07/28/2020 0953   ALBUMIN 3.6 07/28/2020 0953   AST 11 (L) 07/28/2020 0953   AST 11 (L) 12/24/2019 1023   ALT 8 07/28/2020 0953   ALT 7 12/24/2019 1023   ALKPHOS 137 (H) 07/28/2020 0953   BILITOT 0.6 07/28/2020 0953   BILITOT 0.6 12/24/2019 1023   GFRNONAA >60 07/28/2020 0953   GFRNONAA >60 12/24/2019 1023   GFRAA >60  11/12/2019 1222    No results found for: SPEP, UPEP  Lab Results  Component Value Date   WBC 3.9 (L) 08/18/2020   NEUTROABS 2.4 08/18/2020   HGB 11.6 (L) 08/18/2020   HCT 35.0 (L) 08/18/2020   MCV 91.9 08/18/2020   PLT 216 08/18/2020      Chemistry      Component Value Date/Time   NA 140 07/28/2020 0953   K 3.9 07/28/2020 0953   CL 106 07/28/2020 0953   CO2 25 07/28/2020 0953   BUN 14 07/28/2020 0953   CREATININE 0.68 07/28/2020 0953   CREATININE 0.66 12/24/2019 1023      Component Value Date/Time   CALCIUM 9.3 07/28/2020 0953   ALKPHOS 137 (H) 07/28/2020 0953   AST 11 (L) 07/28/2020 0953   AST 11 (L) 12/24/2019 1023   ALT 8 07/28/2020 0953   ALT 7 12/24/2019 1023   BILITOT 0.6 07/28/2020 0953   BILITOT 0.6 12/24/2019 1023

## 2020-09-06 ENCOUNTER — Ambulatory Visit (HOSPITAL_BASED_OUTPATIENT_CLINIC_OR_DEPARTMENT_OTHER): Payer: Medicare Other

## 2020-09-06 ENCOUNTER — Other Ambulatory Visit: Payer: Self-pay

## 2020-09-06 ENCOUNTER — Inpatient Hospital Stay: Payer: Medicare Other

## 2020-09-06 ENCOUNTER — Ambulatory Visit (HOSPITAL_COMMUNITY)
Admission: RE | Admit: 2020-09-06 | Discharge: 2020-09-06 | Disposition: A | Discharge status: Home / Self Care | Payer: Medicare Other | Source: Ambulatory Visit | Attending: Hematology and Oncology | Admitting: Hematology and Oncology

## 2020-09-06 ENCOUNTER — Encounter (HOSPITAL_COMMUNITY): Payer: Self-pay

## 2020-09-06 DIAGNOSIS — C55 Malignant neoplasm of uterus, part unspecified: Secondary | ICD-10-CM | POA: Diagnosis present

## 2020-09-06 DIAGNOSIS — C541 Malignant neoplasm of endometrium: Secondary | ICD-10-CM | POA: Diagnosis not present

## 2020-09-06 DIAGNOSIS — E039 Hypothyroidism, unspecified: Secondary | ICD-10-CM

## 2020-09-06 LAB — CMP (CANCER CENTER ONLY)
ALT: 10 U/L (ref 0–44)
AST: 13 U/L — ABNORMAL LOW (ref 15–41)
Albumin: 3.9 g/dL (ref 3.5–5.0)
Alkaline Phosphatase: 116 U/L (ref 38–126)
Anion gap: 11 (ref 5–15)
BUN: 15 mg/dL (ref 8–23)
CO2: 27 mmol/L (ref 22–32)
Calcium: 9.6 mg/dL (ref 8.9–10.3)
Chloride: 102 mmol/L (ref 98–111)
Creatinine: 0.75 mg/dL (ref 0.44–1.00)
GFR, Estimated: >60 mL/min (ref >60)
Glucose, Bld: 96 mg/dL (ref 70–99)
Potassium: 4.2 mmol/L (ref 3.5–5.1)
Sodium: 140 mmol/L (ref 135–145)
Total Bilirubin: 0.5 mg/dL (ref 0.3–1.2)
Total Protein: 7.7 g/dL (ref 6.5–8.1)

## 2020-09-06 LAB — CBC WITH DIFFERENTIAL/PLATELET
Abs Immature Granulocytes: 0.01 10*3/uL (ref 0.00–0.07)
Basophils Absolute: 0 10*3/uL (ref 0.0–0.1)
Basophils Relative: 1 %
Eosinophils Absolute: 0.1 10*3/uL (ref 0.0–0.5)
Eosinophils Relative: 3 %
HCT: 35.3 % — ABNORMAL LOW (ref 36.0–46.0)
Hemoglobin: 11.7 g/dL — ABNORMAL LOW (ref 12.0–15.0)
Immature Granulocytes: 0 %
Lymphocytes Relative: 25 %
Lymphs Abs: 0.9 10*3/uL (ref 0.7–4.0)
MCH: 30.4 pg (ref 26.0–34.0)
MCHC: 33.1 g/dL (ref 30.0–36.0)
MCV: 91.7 fL (ref 80.0–100.0)
Monocytes Absolute: 0.5 10*3/uL (ref 0.1–1.0)
Monocytes Relative: 12 %
Neutro Abs: 2.3 10*3/uL (ref 1.7–7.7)
Neutrophils Relative %: 59 %
Platelets: 223 10*3/uL (ref 150–400)
RBC: 3.85 MIL/uL — ABNORMAL LOW (ref 3.87–5.11)
RDW: 13.3 % (ref 11.5–15.5)
WBC: 3.8 10*3/uL — ABNORMAL LOW (ref 4.0–10.5)
nRBC: 0 % (ref 0.0–0.2)

## 2020-09-06 LAB — TSH: TSH: 1.393 u[IU]/mL (ref 0.308–3.960)

## 2020-09-06 MED ORDER — SODIUM CHLORIDE 0.9% FLUSH
10.0000 mL | Freq: Once | INTRAVENOUS | Status: AC
  Administered 2020-09-06: 10 mL
  Filled 2020-09-06: qty 10

## 2020-09-06 MED ORDER — IOHEXOL 350 MG/ML SOLN
100.0000 mL | Freq: Once | INTRAVENOUS | Status: AC | PRN
  Administered 2020-09-06: 100 mL via INTRAVENOUS

## 2020-09-06 MED ORDER — HEPARIN SOD (PORK) LOCK FLUSH 100 UNIT/ML IV SOLN: 500.0000 [IU] | Freq: Once | INTRAVENOUS | Status: AC

## 2020-09-06 MED ORDER — HEPARIN SOD (PORK) LOCK FLUSH 100 UNIT/ML IV SOLN
INTRAVENOUS | Status: AC
  Administered 2020-09-06: 500 [IU]
  Filled 2020-09-06: qty 5

## 2020-09-06 NOTE — Patient Instructions (Signed)

## 2020-09-08 ENCOUNTER — Other Ambulatory Visit: Payer: Self-pay

## 2020-09-08 ENCOUNTER — Other Ambulatory Visit: Payer: Medicare Other

## 2020-09-08 ENCOUNTER — Encounter: Payer: Self-pay | Admitting: Hematology and Oncology

## 2020-09-08 ENCOUNTER — Inpatient Hospital Stay: Payer: Medicare Other

## 2020-09-08 ENCOUNTER — Inpatient Hospital Stay (HOSPITAL_BASED_OUTPATIENT_CLINIC_OR_DEPARTMENT_OTHER): Payer: Medicare Other | Admitting: Hematology and Oncology

## 2020-09-08 DIAGNOSIS — C55 Malignant neoplasm of uterus, part unspecified: Secondary | ICD-10-CM

## 2020-09-08 DIAGNOSIS — I825Z2 Chronic embolism and thrombosis of unspecified deep veins of left distal lower extremity: Secondary | ICD-10-CM

## 2020-09-08 DIAGNOSIS — C801 Malignant (primary) neoplasm, unspecified: Secondary | ICD-10-CM

## 2020-09-08 DIAGNOSIS — C7951 Secondary malignant neoplasm of bone: Secondary | ICD-10-CM

## 2020-09-08 DIAGNOSIS — C774 Secondary and unspecified malignant neoplasm of inguinal and lower limb lymph nodes: Secondary | ICD-10-CM

## 2020-09-08 DIAGNOSIS — G893 Neoplasm related pain (acute) (chronic): Secondary | ICD-10-CM | POA: Diagnosis not present

## 2020-09-08 DIAGNOSIS — C541 Malignant neoplasm of endometrium: Secondary | ICD-10-CM | POA: Diagnosis not present

## 2020-09-08 DIAGNOSIS — Z7189 Other specified counseling: Secondary | ICD-10-CM

## 2020-09-08 MED ORDER — HEPARIN SOD (PORK) LOCK FLUSH 100 UNIT/ML IV SOLN
500.0000 [IU] | Freq: Once | INTRAVENOUS | Status: AC | PRN
  Administered 2020-09-08: 500 [IU]
  Filled 2020-09-08: qty 5

## 2020-09-08 MED ORDER — SODIUM CHLORIDE 0.9 % IV SOLN
Freq: Once | INTRAVENOUS | Status: AC
  Filled 2020-09-08: qty 250

## 2020-09-08 MED ORDER — SODIUM CHLORIDE 0.9% FLUSH
10.0000 mL | INTRAVENOUS | Status: DC | PRN
  Administered 2020-09-08: 10 mL
  Filled 2020-09-08: qty 10

## 2020-09-08 MED ORDER — SODIUM CHLORIDE 0.9 % IV SOLN
200.0000 mg | Freq: Once | INTRAVENOUS | Status: AC
  Administered 2020-09-08: 200 mg via INTRAVENOUS
  Filled 2020-09-08: qty 8

## 2020-09-08 NOTE — Progress Notes (Signed)
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   All questions were answered. The patient knows to call the clinic with any problems, questions or concerns. The total time spent in the appointment was 20 minutes encounter with patients including review of chart and various tests results, discussions about plan of care and coordination of care plan   [name], MD 09/08/2020 10:36 AM  INTERVAL HISTORY: Please see below for problem oriented charting. 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 Uterus +/- tubes/ovaries, neoplastic, with left fallopian tube and ovary - INVASIVE ENDOMETRIOID CARCINOMA (1.5 CM), FIGO GRADE II, ARISING IN A BACKGROUND OF ATYPICAL COMPLEX HYPERPLASIA, CONFINED WITHIN INNER HALF OF THE MYOMETRIUM. -  ENDOMETRIAL POLYP WITH ASSOCIATED ATYPICAL COMPLEX HYPERPLASIA. - MYOMETRIUM: LEIOMYOMATA. - CERVIX: BENIGN SQUAMOUS MUCOSA AND ENDOCERVICAL MUCOSA, NO DYSPLASIA OR MALIGNANCY. - LEFT OVARY: BENIGN OVARIAN TISSUE WITH ENDOSALPINGOSIS, NO EVIDENCE OF ATYPIA OR MALIGNANCY. - LEFT FALLOPIAN TUBE: NO HISTOLOGIC ABNORMALITIES. - PLEASE SEE ONCOLOGY TEMPLATE FOR DETAIL. 2. Ovary and fallopian tube, right - BENIGN OVARIAN TISSUE WITH ENDOSALPINGOSIS, NO ATYPIA OR MALIGNANCY. - BENIGN FALLOPIAN TUBAL TISSUE, NO PATHOLOGIC ABNORMALITIES. Microscopic Comment 1. UTERUS Specimen: Uterus, cervix, bilateral ovaries and fallopian tubes Procedure: Total hysterectomy and bilateral salpingo-oophorectomy Lymph node sampling performed: No Specimen integrity: Intact Maximum tumor size (cm): 1.5 cm, glass slide measurement Histologic type: Invasive endometrioid carcinoma Grade: FIGO grade II Myometrial invasion: 1 cm where myometrium is 2.3 cm in thickness Cervical stromal involvement: No Extent of involvement of other organs: No Lymph vascular invasion: Not identified Peritoneal washings: Negative ([id number]) Lymph nodes: number examined N/A; number positive N/A TNM code: pT1a, pNX 1 oFf 3IGO Stage (based on pathologic findings, needs clinical correlation): IA  Comments: Sections the endomyometrium away from the grossly identified endometrial polyp show an invasive FIGO grade II endometrioid carcinoma. The tumor is confined within inner half of the myometrium. No angiolymphatic invasion is identified. No cervical stromal involvement is identified. Sections of the grossly identified endometrial polyp show an endometrial polyp with associated atypical compacted hyperplasia with no definitive evidence of carcinoma.    12/07/2017 Imaging   US venous Doppler Right: No evidence of common femoral vein obstruction. Left: Findings consistent with acute deep vein thrombosis involving the left femoral vein, left  proximal profunda vein, and left popliteal vein. Unable to adequately interrogate the common femoral and  higher, or the calf secondary to significant edema and body habitus    12/07/2017 [facility] Admission   She presented to the ER and was diagnosed with acute DVT    01/18/2018 - 01/21/2018 Hospital Admission   She was admitted to the hospital for management of severe persistent DVT    01/18/2018 Imaging   US venous Doppler Right: No evidence of common femoral vein obstruction. Left: Findings consistent with acute deep vein thrombosis involving the left common femoral vein, and left popliteal vein.    01/19/2018 Surgery   Pre-operative Diagnosis: Subacute DVT with severe post thrombotic syndrome Post-operative diagnosis:  Same Surgeon:  Erlene Quan C. Donzetta Matters, MD Procedure Performed: 1.  Ultrasound-guided cannulation left small saphenous vein 2.  Left lower extremity and central venography 3.  Intravascular ultrasound of left popliteal, femoral, common femoral, external and common iliac veins and IVC 4.  Stent of left common and external iliac veins with 14 x 60 mm Vici 5.  Moderate sedation with fentanyl and Versed for 50 minutes   Indications: 75 year old female with a history of DVT in October now presents with persistent left lower extremity swelling and ultrasound demonstrating likely persistent DVT.  She has been on Xarelto at this time.  She is now indicated for venogram possible intervention.   Findings: Flow in the left lower extremity was stagnant throughout but by venogram all veins were patent.  There was a focal occlusive area approximately 2 cm in length at the common and external iliac vein junction at the hypogastric on the left.  After stenting and ballooning we had a diameter of 12 millimeters in the stent and venogram demonstrated flow in the lower extremity veins were previously was stagnant and no further residual stenosis in the left common and external iliac vein  junction.    04/12/2018 Imaging   US Venous Doppler Right: No evidence of common femoral vein obstruction. Left: There is no evidence of deep vein thrombosis in the lower extremity. However, portions of this examination were limited- see technologist comments above. Left groin: Large hypoechoic area with mixed echoes noted measuring nearly 10 cm. Possible  hematoma versus unknown etiology. Ultrasound characteristics of enlarged lymph nodes noted in the groin.      05/15/2018 Imaging   US Venous Doppler Right: No evidence of deep vein thrombosis in the lower extremity. No indirect evidence of obstruction proximal to the inguinal ligament. Left: No reflux was noted in the common femoral vein , femoral vein in the thigh, popliteal vein, great saphenous vein at the saphenofemoral junction, great saphenous vein at the proximal thigh, great saphenous vein at the mid thigh, great saphenous vein  at the distal thigh, great saphenous vein at the knee, origin of the small saphenous vein, proximal small saphenous vein, and mid small saphenous vein. There is no evidence of deep vein thrombosis in the lower extremity. There is no evidence of superficial venous thrombosis. No cystic structure found in the popliteal fossa. Unable to evaluate extension of common femoral vein obstruction proximal to the inguinal ligament.    06/01/2018 Imaging   1. 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 Additionally, there is lucency involving the medial sidewall of the left acetabulum with potential nondisplaced pathologic fracture. Further evaluation with contrast-enhanced pelvic MRI could be performed as clinically indicated. 2. The left pelvic arterial and venous system is encased by this infiltrative left pelvic sidewall mass however while difficult  to ascertain, the left external iliac venous stent appears patent.     06/18/2018 Pathology Results   Lymph node for lymphoma, Left Inguinal - METASTATIC ADENOCARCINOMA, SEE COMMENT. Microscopic Comment Immunohistochemistry is positive for cytokeratin 7, PAX8, ER, and PR. Cytokeratin 5/6,and p63 are negative. The immunoprofile along with the patient's history are consistent with a gynecologic primary.    06/18/2018 Surgery   Pre-op Diagnosis: INGUINAL LYMPHADENOPATHY, PELVIC MASS      Procedure(s): EXCISIONAL BIOPSY DEEP LEFT INGUINAL LYMPH NODE   Surgeon(s): Coralie Keens, MD      06/24/2018 Cancer Staging   Staging form: Corpus Uteri - Carcinoma and Carcinosarcoma, AJCC 8th Edition - Clinical: Stage IVB (cT1a, cN2, pM1) - Signed by [name], MD on 06/24/2018     Genetic Testing   Patient has genetic testing done for MMR on pathology from 06/18/2018. Results revealed patient has the following mutation(s): MMR: abnormal    06/29/2018 Procedure   Placement of a subcutaneous port device. Catheter tip at the SVC and right atrium junction.     Genetic Testing   Patient has genetic testing done for MSI on pathology from 06/18/2018. Results revealed patient has the following mutation(s): MSI: High    07/02/2018 PET scan   Previous hysterectomy, with asymmetric focus of hypermetabolic activity in the left vaginal cuff, suspicious for residual or recurrent carcinoma.   Large hypermetabolic soft tissue mass involving the left pelvic sidewall and acetabulum, consistent with metastatic disease.   No evidence metastatic disease within the abdomen, chest, or neck.    07/09/2018 Tumor Marker   Patient's tumor was tested for the following markers: CA-125 Results of the tumor marker test revealed 9    07/10/2018 - 08/24/2018 Chemotherapy   The patient had carboplatin and taxol x 3 cycles   07/17/2018 Genetic Testing   Negative genetic testing on the common hereditary cancer panel.  The Common Hereditary Gene Panel offered by [facility] includes sequencing  and/or deletion duplication testing of the following 48 genes: APC, ATM, AXIN2, BARD1, BMPR1A, BRCA1, BRCA2, BRIP1, CDH1, CDK4, CDKN2A (p14ARF), CDKN2A (p16INK4a), CHEK2, CTNNA1, DICER1, EPCAM (Deletion/duplication testing only), GREM1 (promoter region deletion/duplication testing only), KIT, MEN1, MLH1, MSH2, MSH3, MSH6, MUTYH, NBN, NF1, NHTL1, PALB2, PDGFRA, PMS2, POLD1, POLE, PTEN, RAD50, RAD51C, RAD51D, RNF43, SDHB, SDHC, SDHD, SMAD4, SMARCA4. STK11, TP53, TSC1, TSC2, and VHL.  The following genes were evaluated for sequence changes only: SDHA and HOXB13 c.251G>A variant only. The report date is Jul 17, 2018.     10/03/2018 Imaging   CT abdomen and pelvis 1.  No acute intra-abdominal process. 2. Grossly unchanged left pelvic sidewall mass with osseous involvement of the medial acetabulum. Progressive mild displacement of the associated comminuted pathologic fracture involving the right acetabulum and puboacetabular junction.  3. New venous stents extending from the left common iliac vein origin to the proximal left common femoral vein. The stents are patent.   11/06/2018 -  Chemotherapy   The patient had pembrolizumab for chemotherapy treatment.     01/28/2019 Imaging   1. No substantial interval change in exam. 2. Interval development of mild fullness in the left intrarenal collecting system and ureter without overt hydronephrosis at this time. 3. Abnormal soft tissue along the left pelvic sidewall has decreased slightly in the interval. 4. Similar appearance of ill-defined fascial planes in the pelvis with some peritoneal thickening along the right pelvic sidewall and potentially involving the sigmoid mesocolon. 5. No substantial ascites.   05/03/2019 Imaging   1. Stable mild left  pelvic sidewall soft tissue density. No new or progressive disease identified within the abdomen or pelvis.  2. Colonic diverticulosis. No radiographic evidence of diverticulitis.   Aortic Atherosclerosis  (ICD10-I70.0).   09/09/2019 Imaging   1. No change in appearance of soft tissue thickening along the LEFT pelvic sidewall adjacent to chronic LEFT acetabular fracture. 2. Mild asymmetry of the bladder wall favoring the LEFT bladder wall, not well assessed. Similar accounting for variable degrees of distension on prior studies potentially related to prior radiation, attention on follow-up. 3. Signs of venous stenting in the LEFT hemipelvis with LEFT lower extremity muscular atrophy and mild stranding with similar appearance. Signs of colonic diverticulosis and diverticular disease without change.   02/24/2020 Imaging   1. Unchanged appearance of the pelvis as detailed below. 2. Unchanged soft tissue thickening of the left pelvic sidewall. 3. 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 4. No evidence discrete mass or lymphadenopathy nor metastatic disease in the abdomen or pelvis. 5. Status post hysterectomy and cholecystectomy. 6. Left common iliac vein stent. 7. Pancolonic diverticulosis.     09/07/2020 Imaging   Stable abnormal soft tissue density in the left pelvic sidewall. No new or progressive disease within the abdomen or pelvis.   Colonic diverticulosis. No radiographic evidence of diverticulitis.   Stable severe destructive left hip arthropathy with fracture involving the medial acetabular wall.     Metastasis to lymph nodes ([place])  06/23/2018 Initial Diagnosis   Metastasis to lymph nodes ([place])    07/10/2018 - 08/24/2018 Chemotherapy   The patient had palonosetron (ALOXI) injection 0.25 mg, 0.25 mg, Intravenous,  Once, 3 of 6 cycles Administration: 0.25 mg (07/10/2018), 0.25 mg (07/31/2018), 0.25 mg (08/24/2018) CARBOplatin (PARAPLATIN) 480 mg in sodium chloride 0.9 % 250 mL chemo infusion, 480 mg (100 % of original dose 482.5 mg), Intravenous,  Once, 3 of 6 cycles Dose modification: 482.5 mg  (original dose 482.5 mg, Cycle 1) Administration: 480 mg (07/10/2018), 480 mg (07/31/2018), 480 mg (08/24/2018) PACLitaxel (TAXOL) 276 mg in sodium chloride 0.9 % 250 mL chemo infusion (> 22m/m2), 140 mg/m2 = 276 mg (80 % of original dose 175 mg/m2), Intravenous,  Once, 3 of 6 cycles Dose modification: 140 mg/m2 (80 % of original dose 175 mg/m2, Cycle 1, Reason: Dose Not Tolerated) Administration: 276 mg (07/10/2018), 276 mg (07/31/2018), 276 mg (08/24/2018) fosaprepitant (EMEND) 150 mg, dexamethasone (DECADRON) 12 mg in sodium chloride 0.9 % 145 mL IVPB, , Intravenous,  Once, 3 of 6 cycles Administration:  (07/10/2018),  (07/31/2018),  (08/24/2018)   for chemotherapy treatment.     11/06/2018 -  Chemotherapy   The patient had pembrolizumab for chemotherapy treatment.     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 [place]  06/24/2018 Initial Diagnosis   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    07/10/2018 - 08/24/2018 Chemotherapy   The patient had palonosetron (ALOXI) injection 0.25 mg, 0.25 mg, Intravenous,  Once, 3 of 6 cycles Administration: 0.25 mg (07/10/2018), 0.25 mg (07/31/2018), 0.25 mg (08/24/2018) CARBOplatin (PARAPLATIN) 480 mg in sodium chloride 0.9 % 250 mL chemo infusion, 480 mg (100 % of original dose 482.5 mg), Intravenous,  Once, 3 of 6 cycles Dose modification: 482.5 mg (original dose 482.5 mg, Cycle 1) Administration: 480 mg (07/10/2018), 480 mg (07/31/2018), 480 mg (08/24/2018) PACLitaxel (TAXOL) 276 mg in sodium chloride 0.9 % 250 mL chemo infusion (> 827mm2), 140 mg/m2 = 276 mg (80 % of original dose 175  mg/m2), Intravenous,  Once, 3 of 6 cycles Dose modification: 140 mg/m2 (80 % of original dose 175 mg/m2, Cycle 1, Reason: Dose Not Tolerated) Administration: 276 mg (07/10/2018), 276 mg (07/31/2018), 276 mg (08/24/2018) fosaprepitant (EMEND) 150 mg, dexamethasone (DECADRON) 12 mg in sodium chloride 0.9 % 145 mL IVPB, , Intravenous,  Once, 3 of 6 cycles Administration:  (07/10/2018),  (07/31/2018),  (08/24/2018)   for chemotherapy  treatment.     11/06/2018 -  Chemotherapy   The patient had pembrolizumab for chemotherapy treatment.     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       REVIEW OF SYSTEMS:   Constitutional: Denies fevers, chills or abnormal weight loss Eyes: Denies blurriness of vision Ears, nose, mouth, throat, and face: Denies mucositis or sore throat Respiratory: Denies cough, dyspnea or wheezes Cardiovascular: Denies palpitation, chest discomfort or lower extremity swelling Gastrointestinal:  Denies nausea, heartburn or change in bowel habits Skin: Denies abnormal skin rashes Lymphatics: Denies new lymphadenopathy or easy bruising Neurological:Denies numbness, tingling or new weaknesses Behavioral/Psych: Mood is stable, no new changes  All other systems were reviewed with the patient and are negative.  I have reviewed the past medical history, past surgical history, social history and family history with the patient and they are unchanged from previous note.  ALLERGIES:  has No Known Allergies.  MEDICATIONS:  Current Outpatient Medications  Medication Sig Dispense Refill   apixaban (ELIQUIS) 2.5 MG TABS tablet Take by mouth 2 (two) times daily.     diclofenac sodium (VOLTAREN) 1 % GEL APPLY 4GRAMS 4 TIMES A DAY AS NEEDED FOR PAINS     gabapentin (NEURONTIN) 300 MG capsule Take 300 mg by mouth 2 (two) times daily.     lidocaine-prilocaine (EMLA) cream Apply 1 application topically daily as needed. 30 g 3   methadone (DOLOPHINE) 10 MG tablet Take 1 tablet (10 mg total) by mouth every 12 (twelve) hours. 60 tablet 0   morphine (MSIR) 15 MG tablet Take 1 tablet (15 mg total) by mouth every 6 (six) hours as needed for severe pain. 60 tablet 0    Olopatadine HCl 0.2 % SOLN Place 1 drop into both eyes daily.     No current facility-administered medications for this visit.    PHYSICAL EXAMINATION: ECOG PERFORMANCE STATUS: 2 - Symptomatic, <50% confined to bed  Vitals:   09/08/20 1028  BP: 136/69  Pulse: 80  Resp: 18  Temp: (!) 97.5 F (36.4 C)  SpO2: 97%   Filed Weights   09/08/20 1028  Weight: 226 lb 3.2 oz (102.6 kg)    GENERAL:alert, no distress and comfortable SKIN: skin color, texture, turgor are normal, no rashes or significant lesions EYES: normal, Conjunctiva are pink and non-injected, sclera clear OROPHARYNX:no exudate, no erythema and lips, buccal mucosa, and tongue normal  NECK: supple, thyroid normal size, non-tender, without nodularity LYMPH:  no palpable lymphadenopathy in the cervical, axillary or inguinal LUNGS: clear to auscultation and percussion with normal breathing effort HEART: regular rate & rhythm and no murmurs and no lower extremity edema ABDOMEN:abdomen soft, non-tender and normal bowel sounds Musculoskeletal:no cyanosis of digits and no clubbing  NEURO: alert & oriented x 3 with fluent speech, no focal motor/sensory deficits  LABORATORY DATA:  I have reviewed the data as listed  Component Value Date/Time   NA 140 09/06/2020 1006   K 4.2 09/06/2020 1006   CL 102 09/06/2020 1006   CO2 27 09/06/2020 1006   GLUCOSE 96 09/06/2020 1006   BUN 15 09/06/2020 1006   CREATININE 0.75 09/06/2020 1006   CALCIUM 9.6 09/06/2020 1006   PROT 7.7 09/06/2020 1006   ALBUMIN 3.9 09/06/2020 1006   AST 13 (L) 09/06/2020 1006   ALT 10 09/06/2020 1006   ALKPHOS 116 09/06/2020 1006   BILITOT 0.5 09/06/2020 1006   GFRNONAA >60 09/06/2020 1006   GFRAA >60 11/12/2019 1222    No results found for: SPEP, UPEP  Lab Results  Component Value Date   WBC 3.8 (L) 09/06/2020   NEUTROABS 2.3 09/06/2020   HGB 11.7 (L) 09/06/2020   HCT 35.3 (L) 09/06/2020   MCV 91.7 09/06/2020   PLT 223 09/06/2020       Chemistry      Component Value Date/Time   NA 140 09/06/2020 1006   K 4.2 09/06/2020 1006   CL 102 09/06/2020 1006   CO2 27 09/06/2020 1006   BUN 15 09/06/2020 1006   CREATININE 0.75 09/06/2020 1006      Component Value Date/Time   CALCIUM 9.6 09/06/2020 1006   ALKPHOS 116 09/06/2020 1006   AST 13 (L) 09/06/2020 1006   ALT 10 09/06/2020 1006   BILITOT 0.5 09/06/2020 1006       RADIOGRAPHIC STUDIES: I have personally reviewed the radiological images as listed and agreed with the findings in the report. CT ABDOMEN PELVIS W CONTRAST  Result Date: 09/07/2020 CLINICAL DATA:  Follow-up metastatic endometrial carcinoma. Undergoing chemotherapy. Previous radiation therapy. EXAM: CT ABDOMEN AND PELVIS WITH CONTRAST TECHNIQUE: Multidetector CT imaging of the abdomen and pelvis was performed using the standard protocol following bolus administration of intravenous contrast. CONTRAST:  173m OMNIPAQUE IOHEXOL 350 MG/ML SOLN COMPARISON:  02/24/2020 FINDINGS: Lower Chest: No acute findings. Hepatobiliary: No hepatic masses identified. Prior cholecystectomy. No evidence of biliary obstruction. Pancreas:  No mass or inflammatory changes. Spleen: Within normal limits in size and appearance. Adrenals/Urinary Tract: No masses identified. Tiny sub-cm cyst again seen in upper pole of right kidney. No evidence of ureteral calculi or hydronephrosis. Unremarkable unopacified urinary bladder. Stomach/Bowel: No evidence of obstruction, inflammatory process or abnormal fluid collections. Colonic diverticulosis again noted, however there is no evidence of diverticulitis. Vascular/Lymphatic: No abdominal lymphadenopathy identified. Abnormal soft tissue density along the left pelvic sidewall is unchanged since previous study. Stent is again seen in the left common and external iliac vein. Reproductive: Prior hysterectomy noted. Adnexal regions are unremarkable in appearance. Other:  None. Musculoskeletal: Stable severe  destructive left hip arthropathy, with fracture involving the medial acetabular wall. IMPRESSION: Stable abnormal soft tissue density in the left pelvic sidewall. No new or progressive disease within the abdomen or pelvis. Colonic diverticulosis. No radiographic evidence of diverticulitis. Stable severe destructive left hip arthropathy with fracture involving the medial acetabular wall. Electronically Signed   By: JMarlaine HindM.D.   On: 09/07/2020 13:29

## 2020-09-08 NOTE — Assessment & Plan Note (Signed)
She is taking both anticoagulation therapy and antiplatelet agent She has no recent bleeding She will continue her medications as directed  

## 2020-09-08 NOTE — Assessment & Plan Note (Signed)
I have reviewed CT imaging with the patient She has stable disease control Per previous discussion, the plan would be to continue pembrolizumab indefinitely She has no side effects from treatment so far I recommend CT imaging every 6 months, next will be due early January

## 2020-09-08 NOTE — Patient Instructions (Signed)
[facility]  ° Discharge Instructions: °Thank you for choosing [facility] to provide your oncology and hematology care.  ° °If you have a lab appointment with the Cancer Center, please go directly to the Cancer Center and check in at the registration area. °  °Wear comfortable clothing and clothing appropriate for easy access to any Portacath or PICC line.  ° °We strive to give you quality time with your provider. You may need to reschedule your appointment if you arrive late (15 or more minutes).  Arriving late affects you and other patients whose appointments are after yours.  Also, if you miss three or more appointments without notifying the office, you may be dismissed from the clinic at the provider’s discretion.    °  °For prescription refill requests, have your pharmacy contact our office and allow 72 hours for refills to be completed.   ° °Today you received the following chemotherapy and/or immunotherapy agents: Pembrolizumab (Keytruda) °  °To help prevent nausea and vomiting after your treatment, we encourage you to take your nausea medication as directed. ° °BELOW ARE SYMPTOMS THAT SHOULD BE REPORTED IMMEDIATELY: °*FEVER GREATER THAN 100.4 F (38 °C) OR HIGHER °*CHILLS OR SWEATING °*NAUSEA AND VOMITING THAT IS NOT CONTROLLED WITH YOUR NAUSEA MEDICATION °*UNUSUAL SHORTNESS OF BREATH °*UNUSUAL BRUISING OR BLEEDING °*URINARY PROBLEMS (pain or burning when urinating, or frequent urination) °*BOWEL PROBLEMS (unusual diarrhea, constipation, pain near the anus) °TENDERNESS IN MOUTH AND THROAT WITH OR WITHOUT PRESENCE OF ULCERS (sore throat, sores in mouth, or a toothache) °UNUSUAL RASH, SWELLING OR PAIN  °UNUSUAL VAGINAL DISCHARGE OR ITCHING  ° °Items with * indicate a potential emergency and should be followed up as soon as possible or go to the Emergency Department if any problems should occur. ° °Please show the CHEMOTHERAPY ALERT CARD or IMMUNOTHERAPY ALERT CARD  at check-in to the Emergency Department and triage nurse. ° °Should you have questions after your visit or need to cancel or reschedule your appointment, please contact [facility]  Dept: [phone]  and follow the prompts.  Office hours are 8:00 a.m. to 4:30 p.m. Monday - Friday. Please note that voicemails left after 4:00 p.m. may not be returned until the following business day.  We are closed weekends and major holidays. You have access to a nurse at all times for urgent questions. Please call the main number to the clinic Dept: [phone] and follow the prompts. ° ° °For any non-urgent questions, you may also contact your provider using MyChart. We now offer e-Visits for anyone 18 and older to request care online for non-urgent symptoms. For details visit [email or web address]. °  °Also download the MyChart app! Go to the app store, search "MyChart", open the app, select [facility], and log in with your MyChart username and password. ° °Due to Covid, a mask is required upon entering the hospital/clinic. If you do not have a mask, one will be given to you upon arrival. For doctor visits, patients may have 1 support person aged 18 or older with them. For treatment visits, patients cannot have anyone with them due to current Covid guidelines and our immunocompromised population.  ° °

## 2020-09-08 NOTE — Assessment & Plan Note (Signed)
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

## 2020-09-26 ENCOUNTER — Telehealth: Payer: Self-pay

## 2020-09-26 NOTE — Telephone Encounter (Signed)
Called and given below message. She verbalized understanding. 

## 2020-09-26 NOTE — Telephone Encounter (Signed)
Returned her call. She is having pain with urination. Denies fever. She thinks she has a UTI. She is currently taking Doxycyline 100 mg BID, started yesterday for 7 days. She is taking for redness to legs.

## 2020-09-26 NOTE — Telephone Encounter (Signed)
Thanks for update

## 2020-09-29 ENCOUNTER — Inpatient Hospital Stay: Payer: Medicare Other | Attending: Hematology and Oncology

## 2020-09-29 ENCOUNTER — Other Ambulatory Visit: Payer: Self-pay

## 2020-09-29 ENCOUNTER — Encounter: Payer: Self-pay | Admitting: Hematology and Oncology

## 2020-09-29 ENCOUNTER — Inpatient Hospital Stay (HOSPITAL_BASED_OUTPATIENT_CLINIC_OR_DEPARTMENT_OTHER): Payer: Medicare Other | Admitting: Hematology and Oncology

## 2020-09-29 ENCOUNTER — Inpatient Hospital Stay: Payer: Medicare Other

## 2020-09-29 DIAGNOSIS — C55 Malignant neoplasm of uterus, part unspecified: Secondary | ICD-10-CM

## 2020-09-29 DIAGNOSIS — C801 Malignant (primary) neoplasm, unspecified: Secondary | ICD-10-CM

## 2020-09-29 DIAGNOSIS — Z79899 Other long term (current) drug therapy: Secondary | ICD-10-CM | POA: Insufficient documentation

## 2020-09-29 DIAGNOSIS — Z86718 Personal history of other venous thrombosis and embolism: Secondary | ICD-10-CM | POA: Insufficient documentation

## 2020-09-29 DIAGNOSIS — Z5112 Encounter for antineoplastic immunotherapy: Secondary | ICD-10-CM | POA: Insufficient documentation

## 2020-09-29 DIAGNOSIS — G893 Neoplasm related pain (acute) (chronic): Secondary | ICD-10-CM | POA: Insufficient documentation

## 2020-09-29 DIAGNOSIS — I700 Atherosclerosis of aorta: Secondary | ICD-10-CM | POA: Insufficient documentation

## 2020-09-29 DIAGNOSIS — Z7901 Long term (current) use of anticoagulants: Secondary | ICD-10-CM | POA: Insufficient documentation

## 2020-09-29 DIAGNOSIS — E039 Hypothyroidism, unspecified: Secondary | ICD-10-CM

## 2020-09-29 DIAGNOSIS — C541 Malignant neoplasm of endometrium: Secondary | ICD-10-CM | POA: Insufficient documentation

## 2020-09-29 DIAGNOSIS — C774 Secondary and unspecified malignant neoplasm of inguinal and lower limb lymph nodes: Secondary | ICD-10-CM

## 2020-09-29 DIAGNOSIS — I825Z2 Chronic embolism and thrombosis of unspecified deep veins of left distal lower extremity: Secondary | ICD-10-CM

## 2020-09-29 DIAGNOSIS — K5730 Diverticulosis of large intestine without perforation or abscess without bleeding: Secondary | ICD-10-CM | POA: Insufficient documentation

## 2020-09-29 DIAGNOSIS — C7951 Secondary malignant neoplasm of bone: Secondary | ICD-10-CM | POA: Diagnosis not present

## 2020-09-29 DIAGNOSIS — Z9221 Personal history of antineoplastic chemotherapy: Secondary | ICD-10-CM | POA: Diagnosis not present

## 2020-09-29 DIAGNOSIS — C779 Secondary and unspecified malignant neoplasm of lymph node, unspecified: Secondary | ICD-10-CM | POA: Insufficient documentation

## 2020-09-29 DIAGNOSIS — Z7189 Other specified counseling: Secondary | ICD-10-CM

## 2020-09-29 LAB — CBC WITH DIFFERENTIAL/PLATELET
Abs Immature Granulocytes: 0.01 10*3/uL (ref 0.00–0.07)
Basophils Absolute: 0 10*3/uL (ref 0.0–0.1)
Basophils Relative: 1 %
Eosinophils Absolute: 0.1 10*3/uL (ref 0.0–0.5)
Eosinophils Relative: 2 %
HCT: 34.1 % — ABNORMAL LOW (ref 36.0–46.0)
Hemoglobin: 11.1 g/dL — ABNORMAL LOW (ref 12.0–15.0)
Immature Granulocytes: 0 %
Lymphocytes Relative: 20 %
Lymphs Abs: 0.8 10*3/uL (ref 0.7–4.0)
MCH: 29.4 pg (ref 26.0–34.0)
MCHC: 32.6 g/dL (ref 30.0–36.0)
MCV: 90.2 fL (ref 80.0–100.0)
Monocytes Absolute: 0.3 10*3/uL (ref 0.1–1.0)
Monocytes Relative: 8 %
Neutro Abs: 2.7 10*3/uL (ref 1.7–7.7)
Neutrophils Relative %: 69 %
Platelets: 212 10*3/uL (ref 150–400)
RBC: 3.78 MIL/uL — ABNORMAL LOW (ref 3.87–5.11)
RDW: 13.2 % (ref 11.5–15.5)
WBC: 4 10*3/uL (ref 4.0–10.5)
nRBC: 0 % (ref 0.0–0.2)

## 2020-09-29 LAB — COMPREHENSIVE METABOLIC PANEL
ALT: 10 U/L (ref 0–44)
AST: 12 U/L — ABNORMAL LOW (ref 15–41)
Albumin: 3.6 g/dL (ref 3.5–5.0)
Alkaline Phosphatase: 121 U/L (ref 38–126)
Anion gap: 9 (ref 5–15)
BUN: 12 mg/dL (ref 8–23)
CO2: 24 mmol/L (ref 22–32)
Calcium: 9.4 mg/dL (ref 8.9–10.3)
Chloride: 108 mmol/L (ref 98–111)
Creatinine, Ser: 0.68 mg/dL (ref 0.44–1.00)
GFR, Estimated: >60 mL/min (ref >60)
Glucose, Bld: 93 mg/dL (ref 70–99)
Potassium: 3.6 mmol/L (ref 3.5–5.1)
Sodium: 141 mmol/L (ref 135–145)
Total Bilirubin: 0.5 mg/dL (ref 0.3–1.2)
Total Protein: 7.1 g/dL (ref 6.5–8.1)

## 2020-09-29 LAB — TSH: TSH: 1.29 u[IU]/mL (ref 0.308–3.960)

## 2020-09-29 MED ORDER — SODIUM CHLORIDE 0.9 % IV SOLN
Freq: Once | INTRAVENOUS | Status: AC
  Filled 2020-09-29: qty 250

## 2020-09-29 MED ORDER — HEPARIN SOD (PORK) LOCK FLUSH 100 UNIT/ML IV SOLN
500.0000 [IU] | Freq: Once | INTRAVENOUS | Status: AC | PRN
  Administered 2020-09-29: 500 [IU]
  Filled 2020-09-29: qty 5

## 2020-09-29 MED ORDER — SODIUM CHLORIDE 0.9% FLUSH
10.0000 mL | Freq: Once | INTRAVENOUS | Status: AC
  Administered 2020-09-29: 10 mL
  Filled 2020-09-29: qty 10

## 2020-09-29 MED ORDER — SODIUM CHLORIDE 0.9% FLUSH
10.0000 mL | INTRAVENOUS | Status: DC | PRN
  Administered 2020-09-29: 10 mL
  Filled 2020-09-29: qty 10

## 2020-09-29 MED ORDER — SODIUM CHLORIDE 0.9 % IV SOLN
200.0000 mg | Freq: Once | INTRAVENOUS | Status: AC
  Administered 2020-09-29: 200 mg via INTRAVENOUS
  Filled 2020-09-29: qty 8

## 2020-09-29 NOTE — Progress Notes (Signed)
[facility] [place] OFFICE PROGRESS NOTE  Patient Care Team: Nolene Ebbs, MD as PCP - General (Internal Medicine)  ASSESSMENT & PLAN:  Uterine cancer [facility]) I have reviewed CT imaging with the patient She has stable disease control Per previous discussion, the plan would be to continue pembrolizumab indefinitely She has no side effects from treatment so far I recommend CT imaging every 6 months, next will be due early January  Lower leg DVT (deep venous thromboembolism), chronic, left ([place]) She is taking both anticoagulation therapy and antiplatelet agent She has no recent bleeding She will continue her medications as directed  Cancer associated pain She has less pain Part of her pain is due to 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   All questions were answered. The patient knows to call the clinic with any problems, questions or concerns. The total time spent in the appointment was 20 minutes encounter with patients including review of chart and various tests results, discussions about plan of care and coordination of care plan   [name], MD 09/29/2020 11:08 AM  INTERVAL HISTORY: Please see below for problem oriented charting. 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 Uterus +/- tubes/ovaries, neoplastic, with left fallopian tube and ovary - INVASIVE ENDOMETRIOID CARCINOMA (1.5 CM), FIGO GRADE II, ARISING IN A BACKGROUND OF ATYPICAL  COMPLEX HYPERPLASIA, CONFINED WITHIN INNER HALF OF THE MYOMETRIUM. - ENDOMETRIAL POLYP WITH ASSOCIATED ATYPICAL COMPLEX HYPERPLASIA. - MYOMETRIUM: LEIOMYOMATA. - CERVIX: BENIGN SQUAMOUS MUCOSA AND ENDOCERVICAL MUCOSA, NO DYSPLASIA OR MALIGNANCY. - LEFT OVARY: BENIGN OVARIAN TISSUE WITH ENDOSALPINGOSIS, NO EVIDENCE OF ATYPIA OR MALIGNANCY. - LEFT FALLOPIAN TUBE: NO HISTOLOGIC ABNORMALITIES. - PLEASE SEE ONCOLOGY TEMPLATE FOR DETAIL. 2. Ovary and fallopian tube, right - BENIGN OVARIAN TISSUE WITH ENDOSALPINGOSIS, NO ATYPIA OR MALIGNANCY. - BENIGN FALLOPIAN TUBAL TISSUE, NO PATHOLOGIC ABNORMALITIES. Microscopic Comment 1. UTERUS Specimen: Uterus, cervix, bilateral ovaries and fallopian tubes Procedure: Total hysterectomy and bilateral salpingo-oophorectomy Lymph node sampling performed: No Specimen integrity: Intact Maximum tumor size (cm): 1.5 cm, glass slide measurement Histologic type: Invasive endometrioid carcinoma Grade: FIGO grade II Myometrial invasion: 1 cm where myometrium is 2.3 cm in thickness Cervical stromal involvement: No Extent of involvement of other organs: No Lymph vascular invasion: Not identified Peritoneal washings: Negative ([id number]) Lymph nodes: number examined N/A; number positive N/A TNM code: pT1a, pNX 1 oFf 3IGO Stage (based on pathologic findings, needs clinical correlation): IA  Comments: Sections the endomyometrium away from the grossly identified endometrial polyp show an invasive FIGO grade II endometrioid carcinoma. The tumor is confined within inner half of the myometrium. No angiolymphatic invasion is identified. No cervical stromal involvement is identified. Sections of the grossly identified endometrial polyp show an endometrial polyp with associated atypical compacted hyperplasia with no definitive evidence of carcinoma.   12/07/2017 Imaging   US venous Doppler Right: No evidence of common femoral vein obstruction. Left: Findings consistent  with acute deep vein thrombosis involving the left femoral vein, left proximal profunda vein, and left  popliteal vein. Unable to adequately interrogate the common femoral and higher, or the calf secondary to significant edema and body habitus   12/07/2017 [facility] Admission   She presented to the ER and was diagnosed with acute DVT   01/18/2018 - 01/21/2018 Hospital Admission   She was admitted to the hospital for management of severe persistent DVT   01/18/2018 Imaging   US venous Doppler Right: No evidence of common femoral vein obstruction. Left: Findings consistent with acute deep vein thrombosis involving the left common femoral vein, and left popliteal vein.   01/19/2018 Surgery   Pre-operative Diagnosis: Subacute DVT with severe post thrombotic syndrome Post-operative diagnosis:  Same Surgeon:  Erlene Quan C. Donzetta Matters, MD Procedure Performed: 1.  Ultrasound-guided cannulation left small saphenous vein 2.  Left lower extremity and central venography 3.  Intravascular ultrasound of left popliteal, femoral, common femoral, external and common iliac veins and IVC 4.  Stent of left common and external iliac veins with 14 x 60 mm Vici 5.  Moderate sedation with fentanyl and Versed for 50 minutes   Indications: 75 year old female with a history of DVT in October now presents with persistent left lower extremity swelling and ultrasound demonstrating likely persistent DVT.  She has been on Xarelto at this time.  She is now indicated for venogram possible intervention.   Findings: Flow in the left lower extremity was stagnant throughout but by venogram all veins were patent.  There was a focal occlusive area approximately 2 cm in length at the common and external iliac vein junction at the hypogastric on the left.  After stenting and ballooning we had a diameter of 12 millimeters in the stent and venogram demonstrated flow in the lower extremity veins were previously was stagnant and no further  residual stenosis in the left common and external iliac vein junction.   04/12/2018 Imaging   US Venous Doppler Right: No evidence of common femoral vein obstruction. Left: There is no evidence of deep vein thrombosis in the lower extremity. However, portions of this examination were limited- see technologist comments above. Left groin: Large hypoechoic area with mixed echoes noted measuring nearly 10 cm. Possible  hematoma versus unknown etiology. Ultrasound characteristics of enlarged lymph nodes noted in the groin.      05/15/2018 Imaging   US Venous Doppler Right: No evidence of deep vein thrombosis in the lower extremity. No indirect evidence of obstruction proximal to the inguinal ligament. Left: No reflux was noted in the common femoral vein , femoral vein in the thigh, popliteal vein, great saphenous vein at the saphenofemoral junction, great saphenous vein at the proximal thigh, great saphenous vein at the mid thigh, great saphenous vein  at the distal thigh, great saphenous vein at the knee, origin of the small saphenous vein, proximal small saphenous vein, and mid small saphenous vein. There is no evidence of deep vein thrombosis in the lower extremity. There is no evidence of superficial venous thrombosis. No cystic structure found in the popliteal fossa. Unable to evaluate extension of common femoral vein obstruction proximal to the inguinal ligament.   06/01/2018 Imaging   1. 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 Additionally, there is lucency involving the medial sidewall of the left acetabulum with potential nondisplaced pathologic fracture. Further evaluation with contrast-enhanced pelvic MRI could be performed as clinically indicated. 2. The left pelvic arterial and venous system is encased by this infiltrative left pelvic sidewall  mass however while difficult to ascertain, the  left external iliac venous stent appears patent.   06/18/2018 Pathology Results   Lymph node for lymphoma, Left Inguinal - METASTATIC ADENOCARCINOMA, SEE COMMENT. Microscopic Comment Immunohistochemistry is positive for cytokeratin 7, PAX8, ER, and PR. Cytokeratin 5/6,and p63 are negative. The immunoprofile along with the patient's history are consistent with a gynecologic primary.   06/18/2018 Surgery   Pre-op Diagnosis: INGUINAL LYMPHADENOPATHY, PELVIC MASS      Procedure(s): EXCISIONAL BIOPSY DEEP LEFT INGUINAL LYMPH NODE   Surgeon(s): Coralie Keens, MD      06/24/2018 Cancer Staging   Staging form: Corpus Uteri - Carcinoma and Carcinosarcoma, AJCC 8th Edition - Clinical: Stage IVB (cT1a, cN2, pM1) - Signed by [name], MD on 06/24/2018    Genetic Testing   Patient has genetic testing done for MMR on pathology from 06/18/2018. Results revealed patient has the following mutation(s): MMR: abnormal   06/29/2018 Procedure   Placement of a subcutaneous port device. Catheter tip at the SVC and right atrium junction.    Genetic Testing   Patient has genetic testing done for MSI on pathology from 06/18/2018. Results revealed patient has the following mutation(s): MSI: High   07/02/2018 PET scan   Previous hysterectomy, with asymmetric focus of hypermetabolic activity in the left vaginal cuff, suspicious for residual or recurrent carcinoma.   Large hypermetabolic soft tissue mass involving the left pelvic sidewall and acetabulum, consistent with metastatic disease.   No evidence metastatic disease within the abdomen, chest, or neck.   07/09/2018 Tumor Marker   Patient's tumor was tested for the following markers: CA-125 Results of the tumor marker test revealed 9   07/10/2018 - 08/24/2018 Chemotherapy   The patient had carboplatin and taxol x 3 cycles   07/17/2018 Genetic Testing   Negative genetic testing on the common hereditary cancer panel.  The Common Hereditary Gene Panel  offered by [facility] includes sequencing and/or deletion duplication testing of the following 48 genes: APC, ATM, AXIN2, BARD1, BMPR1A, BRCA1, BRCA2, BRIP1, CDH1, CDK4, CDKN2A (p14ARF), CDKN2A (p16INK4a), CHEK2, CTNNA1, DICER1, EPCAM (Deletion/duplication testing only), GREM1 (promoter region deletion/duplication testing only), KIT, MEN1, MLH1, MSH2, MSH3, MSH6, MUTYH, NBN, NF1, NHTL1, PALB2, PDGFRA, PMS2, POLD1, POLE, PTEN, RAD50, RAD51C, RAD51D, RNF43, SDHB, SDHC, SDHD, SMAD4, SMARCA4. STK11, TP53, TSC1, TSC2, and VHL.  The following genes were evaluated for sequence changes only: SDHA and HOXB13 c.251G>A variant only. The report date is Jul 17, 2018.    10/03/2018 Imaging   CT abdomen and pelvis 1.  No acute intra-abdominal process. 2. Grossly unchanged left pelvic sidewall mass with osseous involvement of the medial acetabulum. Progressive mild displacement of the associated comminuted pathologic fracture involving the right acetabulum and puboacetabular junction.  3. New venous stents extending from the left common iliac vein origin to the proximal left common femoral vein. The stents are patent.   11/06/2018 -  Chemotherapy   The patient had pembrolizumab for chemotherapy treatment.     01/28/2019 Imaging   1. No substantial interval change in exam. 2. Interval development of mild fullness in the left intrarenal collecting system and ureter without overt hydronephrosis at this time. 3. Abnormal soft tissue along the left pelvic sidewall has decreased slightly in the interval. 4. Similar appearance of ill-defined fascial planes in the pelvis with some peritoneal thickening along the right pelvic sidewall and potentially involving the sigmoid mesocolon. 5. No substantial ascites.   05/03/2019 Imaging   1. Stable mild left pelvic sidewall soft tissue density.  No new or progressive disease identified within the abdomen or pelvis.  2. Colonic diverticulosis. No radiographic evidence of  diverticulitis.   Aortic Atherosclerosis (ICD10-I70.0).   09/09/2019 Imaging   1. No change in appearance of soft tissue thickening along the LEFT pelvic sidewall adjacent to chronic LEFT acetabular fracture. 2. Mild asymmetry of the bladder wall favoring the LEFT bladder wall, not well assessed. Similar accounting for variable degrees of distension on prior studies potentially related to prior radiation, attention on follow-up. 3. Signs of venous stenting in the LEFT hemipelvis with LEFT lower extremity muscular atrophy and mild stranding with similar appearance. Signs of colonic diverticulosis and diverticular disease without change.   02/24/2020 Imaging   1. Unchanged appearance of the pelvis as detailed below. 2. Unchanged soft tissue thickening of the left pelvic sidewall. 3. 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 4. No evidence discrete mass or lymphadenopathy nor metastatic disease in the abdomen or pelvis. 5. Status post hysterectomy and cholecystectomy. 6. Left common iliac vein stent. 7. Pancolonic diverticulosis.     09/07/2020 Imaging   Stable abnormal soft tissue density in the left pelvic sidewall. No new or progressive disease within the abdomen or pelvis.   Colonic diverticulosis. No radiographic evidence of diverticulitis.   Stable severe destructive left hip arthropathy with fracture involving the medial acetabular wall.     Metastasis to lymph nodes ([place])  06/23/2018 Initial Diagnosis   Metastasis to lymph nodes ([place])   07/10/2018 - 08/24/2018 Chemotherapy   The patient had palonosetron (ALOXI) injection 0.25 mg, 0.25 mg, Intravenous,  Once, 3 of 6 cycles Administration: 0.25 mg (07/10/2018), 0.25 mg (07/31/2018), 0.25 mg (08/24/2018) CARBOplatin (PARAPLATIN) 480 mg in sodium chloride 0.9 % 250 mL chemo infusion, 480 mg (100 % of original dose 482.5 mg), Intravenous,  Once, 3 of  6 cycles Dose modification: 482.5 mg (original dose 482.5 mg, Cycle 1) Administration: 480 mg (07/10/2018), 480 mg (07/31/2018), 480 mg (08/24/2018) PACLitaxel (TAXOL) 276 mg in sodium chloride 0.9 % 250 mL chemo infusion (> 48m/m2), 140 mg/m2 = 276 mg (80 % of original dose 175 mg/m2), Intravenous,  Once, 3 of 6 cycles Dose modification: 140 mg/m2 (80 % of original dose 175 mg/m2, Cycle 1, Reason: Dose Not Tolerated) Administration: 276 mg (07/10/2018), 276 mg (07/31/2018), 276 mg (08/24/2018) fosaprepitant (EMEND) 150 mg, dexamethasone (DECADRON) 12 mg in sodium chloride 0.9 % 145 mL IVPB, , Intravenous,  Once, 3 of 6 cycles Administration:  (07/10/2018),  (07/31/2018),  (08/24/2018)   for chemotherapy treatment.     11/06/2018 -  Chemotherapy   The patient had pembrolizumab for chemotherapy treatment.     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  06/24/2018 Initial Diagnosis   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   07/10/2018 - 08/24/2018 Chemotherapy   The patient had palonosetron (ALOXI) injection 0.25 mg, 0.25 mg, Intravenous,  Once, 3 of 6 cycles Administration: 0.25 mg (07/10/2018), 0.25 mg (07/31/2018), 0.25 mg (08/24/2018) CARBOplatin (PARAPLATIN) 480 mg in sodium chloride 0.9 % 250 mL chemo infusion, 480 mg (100 % of original dose 482.5 mg), Intravenous,  Once, 3 of 6 cycles Dose modification: 482.5 mg (original dose 482.5 mg, Cycle 1) Administration: 480 mg (07/10/2018), 480 mg (07/31/2018), 480 mg (08/24/2018) PACLitaxel (TAXOL) 276 mg in sodium chloride 0.9 % 250 mL chemo infusion (> 889mm2), 140 mg/m2 = 276 mg (80 % of original dose 175 mg/m2), Intravenous,  Once, 3 of 6  cycles Dose modification: 140 mg/m2 (80 % of original dose 175 mg/m2, Cycle 1, Reason: Dose Not Tolerated) Administration: 276 mg (07/10/2018), 276 mg (07/31/2018), 276 mg (08/24/2018) fosaprepitant (EMEND) 150 mg, dexamethasone (DECADRON) 12 mg in sodium chloride 0.9 % 145 mL IVPB, , Intravenous,  Once, 3 of 6 cycles Administration:  (07/10/2018),   (07/31/2018),  (08/24/2018)   for chemotherapy treatment.     11/06/2018 -  Chemotherapy   The patient had pembrolizumab for chemotherapy treatment.     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       REVIEW OF SYSTEMS:   Constitutional: Denies fevers, chills or abnormal weight loss Eyes: Denies blurriness of vision Ears, nose, mouth, throat, and face: Denies mucositis or sore throat Respiratory: Denies cough, dyspnea or wheezes Gastrointestinal:  Denies nausea, heartburn or change in bowel habits Lymphatics: Denies new lymphadenopathy or easy bruising Neurological:Denies numbness, tingling or new weaknesses Behavioral/Psych: Mood is stable, no new changes  All other systems were reviewed with the patient and are negative.  I have reviewed the past medical history, past surgical history, social history and family history with the patient and they are unchanged from previous note.  ALLERGIES:  has No Known Allergies.  MEDICATIONS:  Current Outpatient Medications  Medication Sig Dispense Refill   apixaban (ELIQUIS) 2.5 MG TABS tablet Take by mouth 2 (two) times daily.     diclofenac sodium (VOLTAREN) 1 % GEL APPLY 4GRAMS 4 TIMES A DAY AS NEEDED FOR PAINS     gabapentin (NEURONTIN) 300 MG capsule Take 300 mg by mouth 2 (two) times daily.     lidocaine-prilocaine (EMLA) cream Apply 1 application topically daily as needed. 30 g 3   methadone (DOLOPHINE) 10 MG tablet Take 1 tablet (10 mg total) by mouth every 12 (twelve) hours. 60 tablet 0   morphine (MSIR) 15 MG tablet Take 1 tablet (15 mg total) by mouth every 6 (six) hours as needed for severe pain. 60 tablet 0   Olopatadine HCl 0.2 % SOLN Place 1 drop into both eyes daily.     No  current facility-administered medications for this visit.   Facility-Administered Medications Ordered in Other Visits  Medication Dose Route Frequency Provider Last Rate Last Admin   heparin lock flush 100 unit/mL  500 Units Intracatheter Once PRN Alvy Bimler, Xavi Tomasik, MD       pembrolizumab (KEYTRUDA) 200 mg in sodium chloride 0.9 % 50 mL chemo infusion  200 mg Intravenous Once Murtaza Shell, MD       sodium chloride flush (NS) 0.9 % injection 10 mL  10 mL Intracatheter PRN Alvy Bimler, Zhania Shaheen, MD        PHYSICAL EXAMINATION: ECOG PERFORMANCE STATUS: 2 - Symptomatic, <50% confined to bed  Vitals:   09/29/20 1028  BP: (!) 123/51  Pulse: 77  Resp: 18  Temp: 98.6 F (37 C)  SpO2: 100%   Filed Weights   09/29/20 1028  Weight: 228 lb 3.2 oz (103.5 kg)    GENERAL:alert, no distress and comfortable NEURO: alert & oriented x 3 with fluent speech, no focal motor/sensory deficits  LABORATORY DATA:  I have reviewed the data as listed    Component Value Date/Time   NA 141 09/29/2020 1007   K 3.6 09/29/2020 1007   CL 108 09/29/2020 1007   CO2  24 09/29/2020 1007   GLUCOSE 93 09/29/2020 1007   BUN 12 09/29/2020 1007   CREATININE 0.68 09/29/2020 1007   CREATININE 0.75 09/06/2020 1006   CALCIUM 9.4 09/29/2020 1007   PROT 7.1 09/29/2020 1007   ALBUMIN 3.6 09/29/2020 1007   AST 12 (L) 09/29/2020 1007   AST 13 (L) 09/06/2020 1006   ALT 10 09/29/2020 1007   ALT 10 09/06/2020 1006   ALKPHOS 121 09/29/2020 1007   BILITOT 0.5 09/29/2020 1007   BILITOT 0.5 09/06/2020 1006   GFRNONAA >60 09/29/2020 1007   GFRNONAA >60 09/06/2020 1006   GFRAA >60 11/12/2019 1222    No results found for: SPEP, UPEP  Lab Results  Component Value Date   WBC 4.0 09/29/2020   NEUTROABS 2.7 09/29/2020   HGB 11.1 (L) 09/29/2020   HCT 34.1 (L) 09/29/2020   MCV 90.2 09/29/2020   PLT 212 09/29/2020      Chemistry      Component Value Date/Time   NA 141 09/29/2020 1007   K 3.6 09/29/2020 1007   CL 108 09/29/2020  1007   CO2 24 09/29/2020 1007   BUN 12 09/29/2020 1007   CREATININE 0.68 09/29/2020 1007   CREATININE 0.75 09/06/2020 1006      Component Value Date/Time   CALCIUM 9.4 09/29/2020 1007   ALKPHOS 121 09/29/2020 1007   AST 12 (L) 09/29/2020 1007   AST 13 (L) 09/06/2020 1006   ALT 10 09/29/2020 1007   ALT 10 09/06/2020 1006   BILITOT 0.5 09/29/2020 1007   BILITOT 0.5 09/06/2020 1006       RADIOGRAPHIC STUDIES: I have personally reviewed the radiological images as listed and agreed with the findings in the report. CT ABDOMEN PELVIS W CONTRAST  Result Date: 09/07/2020 CLINICAL DATA:  Follow-up metastatic endometrial carcinoma. Undergoing chemotherapy. Previous radiation therapy. EXAM: CT ABDOMEN AND PELVIS WITH CONTRAST TECHNIQUE: Multidetector CT imaging of the abdomen and pelvis was performed using the standard protocol following bolus administration of intravenous contrast. CONTRAST:  139m OMNIPAQUE IOHEXOL 350 MG/ML SOLN COMPARISON:  02/24/2020 FINDINGS: Lower Chest: No acute findings. Hepatobiliary: No hepatic masses identified. Prior cholecystectomy. No evidence of biliary obstruction. Pancreas:  No mass or inflammatory changes. Spleen: Within normal limits in size and appearance. Adrenals/Urinary Tract: No masses identified. Tiny sub-cm cyst again seen in upper pole of right kidney. No evidence of ureteral calculi or hydronephrosis. Unremarkable unopacified urinary bladder. Stomach/Bowel: No evidence of obstruction, inflammatory process or abnormal fluid collections. Colonic diverticulosis again noted, however there is no evidence of diverticulitis. Vascular/Lymphatic: No abdominal lymphadenopathy identified. Abnormal soft tissue density along the left pelvic sidewall is unchanged since previous study. Stent is again seen in the left common and external iliac vein. Reproductive: Prior hysterectomy noted. Adnexal regions are unremarkable in appearance. Other:  None. Musculoskeletal: Stable  severe destructive left hip arthropathy, with fracture involving the medial acetabular wall. IMPRESSION: Stable abnormal soft tissue density in the left pelvic sidewall. No new or progressive disease within the abdomen or pelvis. Colonic diverticulosis. No radiographic evidence of diverticulitis. Stable severe destructive left hip arthropathy with fracture involving the medial acetabular wall. Electronically Signed   By: JMarlaine HindM.D.   On: 09/07/2020 13:29

## 2020-09-29 NOTE — Assessment & Plan Note (Signed)
I have reviewed CT imaging with the patient She has stable disease control Per previous discussion, the plan would be to continue pembrolizumab indefinitely She has no side effects from treatment so far I recommend CT imaging every 6 months, next will be due early January

## 2020-09-29 NOTE — Patient Instructions (Signed)
[facility]COL[facility]  ° Discharge Instructions: °Thank you for choosing [facility] to provide your oncology and hematology care.  ° °If you have a lab appointment with the Cancer Center, please go directly to the Cancer Center and check in at the registration area. °  °Wear comfortable clothing and clothing appropriate for easy access to any Portacath or PICC line.  ° °We strive to give you quality time with your provider. You may need to reschedule your appointment if you arrive late (15 or more minutes).  Arriving late affects you and other patients whose appointments are after yours.  Also, if you miss three or more appointments without notifying the office, you may be dismissed from the clinic at the provider’s discretion.    °  °For prescription refill requests, have your pharmacy contact our office and allow 72 hours for refills to be completed.   ° °Today you received the following chemotherapy and/or immunotherapy agents: Pembrolizumab (Keytruda) °  °To help prevent nausea and vomiting after your treatment, we encourage you to take your nausea medication as directed. ° °BELOW ARE SYMPTOMS THAT SHOULD BE REPORTED IMMEDIATELY: °*FEVER GREATER THAN 100.4 F (38 °C) OR HIGHER °*CHILLS OR SWEATING °*NAUSEA AND VOMITING THAT IS NOT CONTROLLED WITH YOUR NAUSEA MEDICATION °*UNUSUAL SHORTNESS OF BREATH °*UNUSUAL BRUISING OR BLEEDING °*URINARY PROBLEMS (pain or burning when urinating, or frequent urination) °*BOWEL PROBLEMS (unusual diarrhea, constipation, pain near the anus) °TENDERNESS IN MOUTH AND THROAT WITH OR WITHOUT PRESENCE OF ULCERS (sore throat, sores in mouth, or a toothache) °UNUSUAL RASH, SWELLING OR PAIN  °UNUSUAL VAGINAL DISCHARGE OR ITCHING  ° °Items with * indicate a potential emergency and should be followed up as soon as possible or go to the Emergency Department if any problems should occur. ° °Please show the CHEMOTHERAPY ALERT CARD or IMMUNOTHERAPY ALERT CARD  at check-in to the Emergency Department and triage nurse. ° °Should you have questions after your visit or need to cancel or reschedule your appointment, please contact [facility]  Dept: [phone]  and follow the prompts.  Office hours are 8:00 a.m. to 4:30 p.m. Monday - Friday. Please note that voicemails left after 4:00 p.m. may not be returned until the following business day.  We are closed weekends and major holidays. You have access to a nurse at all times for urgent questions. Please call the main number to the clinic Dept: [phone] and follow the prompts. ° ° °For any non-urgent questions, you may also contact your provider using MyChart. We now offer e-Visits for anyone 18 and older to request care online for non-urgent symptoms. For details visit [email or web address]. °  °Also download the MyChart app! Go to the app store, search "MyChart", open the app, select [facility], and log in with your MyChart username and password. ° °Due to Covid, a mask is required upon entering the hospital/clinic. If you do not have a mask, one will be given to you upon arrival. For doctor visits, patients may have 1 support person aged 18 or older with them. For treatment visits, patients cannot have anyone with them due to current Covid guidelines and our immunocompromised population.  ° °

## 2020-09-29 NOTE — Assessment & Plan Note (Signed)
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

## 2020-09-29 NOTE — Assessment & Plan Note (Signed)
She is taking both anticoagulation therapy and antiplatelet agent She has no recent bleeding She will continue her medications as directed  

## 2020-10-20 ENCOUNTER — Other Ambulatory Visit: Payer: Self-pay

## 2020-10-20 ENCOUNTER — Inpatient Hospital Stay: Payer: Medicare Other | Attending: Hematology and Oncology

## 2020-10-20 ENCOUNTER — Inpatient Hospital Stay (HOSPITAL_BASED_OUTPATIENT_CLINIC_OR_DEPARTMENT_OTHER): Payer: Medicare Other | Admitting: Hematology and Oncology

## 2020-10-20 ENCOUNTER — Inpatient Hospital Stay: Payer: Medicare Other

## 2020-10-20 ENCOUNTER — Encounter: Payer: Self-pay | Admitting: Hematology and Oncology

## 2020-10-20 DIAGNOSIS — C55 Malignant neoplasm of uterus, part unspecified: Secondary | ICD-10-CM | POA: Diagnosis not present

## 2020-10-20 DIAGNOSIS — C7951 Secondary malignant neoplasm of bone: Secondary | ICD-10-CM | POA: Diagnosis not present

## 2020-10-20 DIAGNOSIS — C778 Secondary and unspecified malignant neoplasm of lymph nodes of multiple regions: Secondary | ICD-10-CM | POA: Insufficient documentation

## 2020-10-20 DIAGNOSIS — I825Z2 Chronic embolism and thrombosis of unspecified deep veins of left distal lower extremity: Secondary | ICD-10-CM

## 2020-10-20 DIAGNOSIS — Z7901 Long term (current) use of anticoagulants: Secondary | ICD-10-CM | POA: Insufficient documentation

## 2020-10-20 DIAGNOSIS — G893 Neoplasm related pain (acute) (chronic): Secondary | ICD-10-CM | POA: Insufficient documentation

## 2020-10-20 DIAGNOSIS — C801 Malignant (primary) neoplasm, unspecified: Secondary | ICD-10-CM

## 2020-10-20 DIAGNOSIS — Z79899 Other long term (current) drug therapy: Secondary | ICD-10-CM | POA: Insufficient documentation

## 2020-10-20 DIAGNOSIS — Z7902 Long term (current) use of antithrombotics/antiplatelets: Secondary | ICD-10-CM | POA: Diagnosis not present

## 2020-10-20 DIAGNOSIS — I700 Atherosclerosis of aorta: Secondary | ICD-10-CM | POA: Insufficient documentation

## 2020-10-20 DIAGNOSIS — N3090 Cystitis, unspecified without hematuria: Secondary | ICD-10-CM | POA: Diagnosis not present

## 2020-10-20 DIAGNOSIS — Z7189 Other specified counseling: Secondary | ICD-10-CM

## 2020-10-20 DIAGNOSIS — C774 Secondary and unspecified malignant neoplasm of inguinal and lower limb lymph nodes: Secondary | ICD-10-CM

## 2020-10-20 DIAGNOSIS — K5730 Diverticulosis of large intestine without perforation or abscess without bleeding: Secondary | ICD-10-CM | POA: Diagnosis not present

## 2020-10-20 DIAGNOSIS — Z5112 Encounter for antineoplastic immunotherapy: Secondary | ICD-10-CM | POA: Diagnosis not present

## 2020-10-20 DIAGNOSIS — C541 Malignant neoplasm of endometrium: Secondary | ICD-10-CM | POA: Diagnosis present

## 2020-10-20 DIAGNOSIS — E039 Hypothyroidism, unspecified: Secondary | ICD-10-CM

## 2020-10-20 LAB — COMPREHENSIVE METABOLIC PANEL
ALT: 8 U/L (ref 0–44)
AST: 12 U/L — ABNORMAL LOW (ref 15–41)
Albumin: 3.6 g/dL (ref 3.5–5.0)
Alkaline Phosphatase: 133 U/L — ABNORMAL HIGH (ref 38–126)
Anion gap: 10 (ref 5–15)
BUN: 14 mg/dL (ref 8–23)
CO2: 23 mmol/L (ref 22–32)
Calcium: 9.1 mg/dL (ref 8.9–10.3)
Chloride: 107 mmol/L (ref 98–111)
Creatinine, Ser: 0.74 mg/dL (ref 0.44–1.00)
GFR, Estimated: >60 mL/min (ref >60)
Glucose, Bld: 93 mg/dL (ref 70–99)
Potassium: 3.8 mmol/L (ref 3.5–5.1)
Sodium: 140 mmol/L (ref 135–145)
Total Bilirubin: 0.4 mg/dL (ref 0.3–1.2)
Total Protein: 7.2 g/dL (ref 6.5–8.1)

## 2020-10-20 LAB — CBC WITH DIFFERENTIAL/PLATELET
Abs Immature Granulocytes: 0.02 10*3/uL (ref 0.00–0.07)
Basophils Absolute: 0 10*3/uL (ref 0.0–0.1)
Basophils Relative: 1 %
Eosinophils Absolute: 0.1 10*3/uL (ref 0.0–0.5)
Eosinophils Relative: 3 %
HCT: 33.9 % — ABNORMAL LOW (ref 36.0–46.0)
Hemoglobin: 11.2 g/dL — ABNORMAL LOW (ref 12.0–15.0)
Immature Granulocytes: 1 %
Lymphocytes Relative: 25 %
Lymphs Abs: 1 10*3/uL (ref 0.7–4.0)
MCH: 29.8 pg (ref 26.0–34.0)
MCHC: 33 g/dL (ref 30.0–36.0)
MCV: 90.2 fL (ref 80.0–100.0)
Monocytes Absolute: 0.4 10*3/uL (ref 0.1–1.0)
Monocytes Relative: 11 %
Neutro Abs: 2.3 10*3/uL (ref 1.7–7.7)
Neutrophils Relative %: 59 %
Platelets: 216 10*3/uL (ref 150–400)
RBC: 3.76 MIL/uL — ABNORMAL LOW (ref 3.87–5.11)
RDW: 13.3 % (ref 11.5–15.5)
WBC: 3.9 10*3/uL — ABNORMAL LOW (ref 4.0–10.5)
nRBC: 0 % (ref 0.0–0.2)

## 2020-10-20 LAB — TSH: TSH: 1.167 u[IU]/mL (ref 0.308–3.960)

## 2020-10-20 MED ORDER — HEPARIN SOD (PORK) LOCK FLUSH 100 UNIT/ML IV SOLN
500.0000 [IU] | Freq: Once | INTRAVENOUS | Status: AC | PRN
  Administered 2020-10-20: 500 [IU]

## 2020-10-20 MED ORDER — SODIUM CHLORIDE 0.9 % IV SOLN
200.0000 mg | Freq: Once | INTRAVENOUS | Status: AC
  Administered 2020-10-20: 200 mg via INTRAVENOUS
  Filled 2020-10-20: qty 8

## 2020-10-20 MED ORDER — SODIUM CHLORIDE 0.9% FLUSH
10.0000 mL | INTRAVENOUS | Status: DC | PRN
  Administered 2020-10-20: 10 mL

## 2020-10-20 MED ORDER — METHADONE HCL 10 MG PO TABS: 10.0000 mg | ORAL_TABLET | Freq: Two times a day (BID) | ORAL | 0 refills | Status: AC

## 2020-10-20 MED ORDER — SODIUM CHLORIDE 0.9 % IV SOLN: Freq: Once | INTRAVENOUS | Status: AC

## 2020-10-20 NOTE — Progress Notes (Signed)
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   All questions were answered. The patient knows to call the clinic with any problems, questions or concerns. The total time spent in the appointment was 20 minutes encounter with patients including review of chart and various tests results, discussions about plan of care and coordination of care plan   [name], MD 10/20/2020 12:41 PM  INTERVAL HISTORY: Please see below for problem oriented charting. she returns for treatment follow-up for treatment with pembrolizumab She is doing well No recent bleeding complications from anticoagulation therapy Her chronic pain is stable She takes pain medicine rarely, only as needed and when she has pain with mobility  REVIEW OF SYSTEMS:   Constitutional: Denies fevers, chills or abnormal weight loss Eyes: Denies blurriness of vision Ears, nose, mouth, throat, and face: Denies mucositis or sore throat Respiratory: Denies cough, dyspnea or wheezes Cardiovascular: Denies palpitation, chest discomfort or lower extremity swelling Gastrointestinal:  Denies nausea, heartburn or change in bowel  habits Skin: Denies abnormal skin rashes Lymphatics: Denies new lymphadenopathy or easy bruising Neurological:Denies numbness, tingling or new weaknesses Behavioral/Psych: Mood is stable, no new changes  All other systems were reviewed with the patient and are negative.  I have reviewed the past medical history, past surgical history, social history and family history with the patient and they are unchanged from previous note.  ALLERGIES:  has No Known Allergies.  MEDICATIONS:  Current Outpatient Medications  Medication Sig Dispense Refill   apixaban (ELIQUIS) 2.5 MG TABS tablet Take by mouth 2 (two) times daily.     diclofenac sodium (VOLTAREN) 1 % GEL APPLY 4GRAMS 4 TIMES A DAY AS NEEDED FOR PAINS     gabapentin (NEURONTIN) 300 MG capsule Take 300 mg by mouth 2 (two) times daily.     lidocaine-prilocaine (EMLA) cream Apply 1 application topically daily as needed. 30 g 3   methadone (DOLOPHINE) 10 MG tablet Take 1 tablet (10 mg total) by mouth every 12 (twelve) hours. 60 tablet 0   morphine (MSIR) 15 MG tablet Take 1 tablet (15 mg total) by mouth every 6 (six) hours as needed for severe pain. 60 tablet 0   Olopatadine HCl 0.2 % SOLN Place 1 drop into both eyes daily.     No current facility-administered medications for this visit.   Facility-Administered Medications Ordered in Other Visits  Medication Dose Route Frequency Provider Last Rate Last Admin   0.9 %  sodium chloride infusion   Intravenous Once Alvy Bimler, Suetta Hoffmeister, MD       heparin lock flush 100 unit/mL  500 Units Intracatheter Once PRN Alvy Bimler, Tanish Prien, MD       pembrolizumab (KEYTRUDA) 200 mg in sodium chloride 0.9 %  50 mL chemo infusion  200 mg Intravenous Once Alvy Bimler, Drystan Reader, MD       sodium chloride flush (NS) 0.9 % injection 10 mL  10 mL Intracatheter PRN [name], MD        SUMMARY OF ONCOLOGIC HISTORY: Oncology History Overview Note  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 Uterus +/- tubes/ovaries, neoplastic, with left fallopian tube and ovary - INVASIVE ENDOMETRIOID CARCINOMA (1.5 CM), FIGO GRADE II, ARISING IN A BACKGROUND OF ATYPICAL COMPLEX HYPERPLASIA, CONFINED WITHIN INNER HALF OF THE MYOMETRIUM. - ENDOMETRIAL POLYP WITH ASSOCIATED ATYPICAL COMPLEX HYPERPLASIA. - MYOMETRIUM: LEIOMYOMATA. - CERVIX: BENIGN SQUAMOUS MUCOSA AND ENDOCERVICAL MUCOSA, NO DYSPLASIA OR MALIGNANCY. - LEFT OVARY: BENIGN OVARIAN TISSUE WITH ENDOSALPINGOSIS, NO EVIDENCE OF ATYPIA OR MALIGNANCY. - LEFT FALLOPIAN TUBE: NO HISTOLOGIC ABNORMALITIES. - PLEASE SEE ONCOLOGY TEMPLATE FOR DETAIL. 2. Ovary and fallopian tube, right - BENIGN OVARIAN TISSUE WITH ENDOSALPINGOSIS, NO ATYPIA OR MALIGNANCY. - BENIGN FALLOPIAN TUBAL TISSUE, NO PATHOLOGIC ABNORMALITIES. Microscopic Comment 1. UTERUS Specimen: Uterus, cervix, bilateral ovaries and fallopian tubes Procedure: Total hysterectomy and bilateral salpingo-oophorectomy Lymph node sampling performed: No Specimen integrity: Intact Maximum tumor size (cm): 1.5 cm, glass slide measurement Histologic type: Invasive endometrioid carcinoma Grade: FIGO grade II Myometrial invasion: 1 cm where myometrium is 2.3 cm in thickness Cervical stromal involvement: No Extent of involvement of other organs: No Lymph vascular invasion: Not identified Peritoneal washings: Negative ([id number]) Lymph nodes: number examined N/A; number positive N/A TNM code: pT1a, pNX 1 oFf 3IGO Stage (based on pathologic findings, needs clinical correlation): IA  Comments: Sections the endomyometrium away from the grossly identified endometrial polyp show an invasive FIGO grade II endometrioid carcinoma. The tumor is confined within inner half of the myometrium. No angiolymphatic invasion is identified. No cervical stromal involvement is identified. Sections of the grossly  identified endometrial polyp show an endometrial polyp with associated atypical compacted hyperplasia with no definitive evidence of carcinoma.   12/07/2017 Imaging   US venous Doppler Right: No evidence of common femoral vein obstruction. Left: Findings consistent with acute deep vein thrombosis involving the left femoral vein, left proximal profunda vein, and left popliteal vein. Unable to adequately interrogate the common femoral and higher, or the calf secondary to significant edema and body habitus   12/07/2017 [facility] Admission   She presented to the ER and was diagnosed with acute DVT   01/18/2018 - 01/21/2018 Hospital Admission   She was admitted to the hospital for management of severe persistent DVT   01/18/2018 Imaging   US venous Doppler Right: No evidence of common femoral vein obstruction. Left: Findings consistent with acute deep vein thrombosis involving the left common femoral vein, and left popliteal vein.   01/19/2018 Surgery   Pre-operative Diagnosis: Subacute DVT with severe post thrombotic syndrome Post-operative diagnosis:  Same Surgeon:  Erlene Quan C. Donzetta Matters, MD Procedure Performed: 1.  Ultrasound-guided cannulation left small saphenous vein 2.  Left lower extremity and central venography 3.  Intravascular ultrasound of left popliteal, femoral, common femoral, external and common iliac veins and IVC 4.  Stent of left common and external iliac veins with 14 x 60 mm Vici 5.  Moderate sedation with fentanyl and Versed for 50 minutes   Indications: 75 year old female with a history of DVT in October now presents with persistent left lower extremity swelling and ultrasound demonstrating  likely persistent DVT.  She has been on Xarelto at this time.  She is now indicated for venogram possible intervention.   Findings: Flow in the left lower extremity was stagnant throughout but by venogram all veins were patent.  There was a focal occlusive area approximately 2 cm in length  at the common and external iliac vein junction at the hypogastric on the left.  After stenting and ballooning we had a diameter of 12 millimeters in the stent and venogram demonstrated flow in the lower extremity veins were previously was stagnant and no further residual stenosis in the left common and external iliac vein junction.   04/12/2018 Imaging   US Venous Doppler Right: No evidence of common femoral vein obstruction. Left: There is no evidence of deep vein thrombosis in the lower extremity. However, portions of this examination were limited- see technologist comments above. Left groin: Large hypoechoic area with mixed echoes noted measuring nearly 10 cm. Possible  hematoma versus unknown etiology. Ultrasound characteristics of enlarged lymph nodes noted in the groin.      05/15/2018 Imaging   US Venous Doppler Right: No evidence of deep vein thrombosis in the lower extremity. No indirect evidence of obstruction proximal to the inguinal ligament. Left: No reflux was noted in the common femoral vein , femoral vein in the thigh, popliteal vein, great saphenous vein at the saphenofemoral junction, great saphenous vein at the proximal thigh, great saphenous vein at the mid thigh, great saphenous vein  at the distal thigh, great saphenous vein at the knee, origin of the small saphenous vein, proximal small saphenous vein, and mid small saphenous vein. There is no evidence of deep vein thrombosis in the lower extremity. There is no evidence of superficial venous thrombosis. No cystic structure found in the popliteal fossa. Unable to evaluate extension of common femoral vein obstruction proximal to the inguinal ligament.   06/01/2018 Imaging   1. 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 Additionally, there is lucency involving the medial sidewall of the left acetabulum with potential nondisplaced  pathologic fracture. Further evaluation with contrast-enhanced pelvic MRI could be performed as clinically indicated. 2. The left pelvic arterial and venous system is encased by this infiltrative left pelvic sidewall mass however while difficult to ascertain, the left external iliac venous stent appears patent.   06/18/2018 Pathology Results   Lymph node for lymphoma, Left Inguinal - METASTATIC ADENOCARCINOMA, SEE COMMENT. Microscopic Comment Immunohistochemistry is positive for cytokeratin 7, PAX8, ER, and PR. Cytokeratin 5/6,and p63 are negative. The immunoprofile along with the patient's history are consistent with a gynecologic primary.   06/18/2018 Surgery   Pre-op Diagnosis: INGUINAL LYMPHADENOPATHY, PELVIC MASS      Procedure(s): EXCISIONAL BIOPSY DEEP LEFT INGUINAL LYMPH NODE   Surgeon(s): Coralie Keens, MD      06/24/2018 Cancer Staging   Staging form: Corpus Uteri - Carcinoma and Carcinosarcoma, AJCC 8th Edition - Clinical: Stage IVB (cT1a, cN2, pM1) - Signed by [name], MD on 06/24/2018    Genetic Testing   Patient has genetic testing done for MMR on pathology from 06/18/2018. Results revealed patient has the following mutation(s): MMR: abnormal   06/29/2018 Procedure   Placement of a subcutaneous port device. Catheter tip at the SVC and right atrium junction.    Genetic Testing   Patient has genetic testing done for MSI on pathology from 06/18/2018. Results revealed patient has the following mutation(s): MSI: High  07/02/2018 PET scan   Previous hysterectomy, with asymmetric focus of hypermetabolic activity in the left vaginal cuff, suspicious for residual or recurrent carcinoma.   Large hypermetabolic soft tissue mass involving the left pelvic sidewall and acetabulum, consistent with metastatic disease.   No evidence metastatic disease within the abdomen, chest, or neck.   07/09/2018 Tumor Marker   Patient's tumor was tested for the following markers:  CA-125 Results of the tumor marker test revealed 9   07/10/2018 - 08/24/2018 Chemotherapy   The patient had carboplatin and taxol x 3 cycles   07/17/2018 Genetic Testing   Negative genetic testing on the common hereditary cancer panel.  The Common Hereditary Gene Panel offered by [facility] includes sequencing and/or deletion duplication testing of the following 48 genes: APC, ATM, AXIN2, BARD1, BMPR1A, BRCA1, BRCA2, BRIP1, CDH1, CDK4, CDKN2A (p14ARF), CDKN2A (p16INK4a), CHEK2, CTNNA1, DICER1, EPCAM (Deletion/duplication testing only), GREM1 (promoter region deletion/duplication testing only), KIT, MEN1, MLH1, MSH2, MSH3, MSH6, MUTYH, NBN, NF1, NHTL1, PALB2, PDGFRA, PMS2, POLD1, POLE, PTEN, RAD50, RAD51C, RAD51D, RNF43, SDHB, SDHC, SDHD, SMAD4, SMARCA4. STK11, TP53, TSC1, TSC2, and VHL.  The following genes were evaluated for sequence changes only: SDHA and HOXB13 c.251G>A variant only. The report date is Jul 17, 2018.    10/03/2018 Imaging   CT abdomen and pelvis 1.  No acute intra-abdominal process. 2. Grossly unchanged left pelvic sidewall mass with osseous involvement of the medial acetabulum. Progressive mild displacement of the associated comminuted pathologic fracture involving the right acetabulum and puboacetabular junction.  3. New venous stents extending from the left common iliac vein origin to the proximal left common femoral vein. The stents are patent.   11/06/2018 -  Chemotherapy   The patient had pembrolizumab for chemotherapy treatment.     01/28/2019 Imaging   1. No substantial interval change in exam. 2. Interval development of mild fullness in the left intrarenal collecting system and ureter without overt hydronephrosis at this time. 3. Abnormal soft tissue along the left pelvic sidewall has decreased slightly in the interval. 4. Similar appearance of ill-defined fascial planes in the pelvis with some peritoneal thickening along the right pelvic sidewall and potentially involving  the sigmoid mesocolon. 5. No substantial ascites.   05/03/2019 Imaging   1. Stable mild left pelvic sidewall soft tissue density. No new or progressive disease identified within the abdomen or pelvis.  2. Colonic diverticulosis. No radiographic evidence of diverticulitis.   Aortic Atherosclerosis (ICD10-I70.0).   09/09/2019 Imaging   1. No change in appearance of soft tissue thickening along the LEFT pelvic sidewall adjacent to chronic LEFT acetabular fracture. 2. Mild asymmetry of the bladder wall favoring the LEFT bladder wall, not well assessed. Similar accounting for variable degrees of distension on prior studies potentially related to prior radiation, attention on follow-up. 3. Signs of venous stenting in the LEFT hemipelvis with LEFT lower extremity muscular atrophy and mild stranding with similar appearance. Signs of colonic diverticulosis and diverticular disease without change.   02/24/2020 Imaging   1. Unchanged appearance of the pelvis as detailed below. 2. Unchanged soft tissue thickening of the left pelvic sidewall. 3. 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 4. No evidence discrete mass or lymphadenopathy nor metastatic disease in the abdomen or pelvis. 5. Status post hysterectomy and cholecystectomy. 6. Left common iliac vein stent. 7. Pancolonic diverticulosis.     09/07/2020 Imaging   Stable abnormal soft tissue density  in the left pelvic sidewall. No new or progressive disease within the abdomen or pelvis.   Colonic diverticulosis. No radiographic evidence of diverticulitis.   Stable severe destructive left hip arthropathy with fracture involving the medial acetabular wall.     Metastasis to lymph nodes ([place])  06/23/2018 Initial Diagnosis   Metastasis to lymph nodes ([place])   07/10/2018 - 08/24/2018 Chemotherapy   The patient had palonosetron (ALOXI) injection 0.25 mg, 0.25 mg,  Intravenous,  Once, 3 of 6 cycles Administration: 0.25 mg (07/10/2018), 0.25 mg (07/31/2018), 0.25 mg (08/24/2018) CARBOplatin (PARAPLATIN) 480 mg in sodium chloride 0.9 % 250 mL chemo infusion, 480 mg (100 % of original dose 482.5 mg), Intravenous,  Once, 3 of 6 cycles Dose modification: 482.5 mg (original dose 482.5 mg, Cycle 1) Administration: 480 mg (07/10/2018), 480 mg (07/31/2018), 480 mg (08/24/2018) PACLitaxel (TAXOL) 276 mg in sodium chloride 0.9 % 250 mL chemo infusion (> 67m/m2), 140 mg/m2 = 276 mg (80 % of original dose 175 mg/m2), Intravenous,  Once, 3 of 6 cycles Dose modification: 140 mg/m2 (80 % of original dose 175 mg/m2, Cycle 1, Reason: Dose Not Tolerated) Administration: 276 mg (07/10/2018), 276 mg (07/31/2018), 276 mg (08/24/2018) fosaprepitant (EMEND) 150 mg, dexamethasone (DECADRON) 12 mg in sodium chloride 0.9 % 145 mL IVPB, , Intravenous,  Once, 3 of 6 cycles Administration:  (07/10/2018),  (07/31/2018),  (08/24/2018)   for chemotherapy treatment.     11/06/2018 -  Chemotherapy   The patient had pembrolizumab for chemotherapy treatment.     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  06/24/2018 Initial Diagnosis   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   07/10/2018 - 08/24/2018 Chemotherapy   The patient had palonosetron (ALOXI) injection 0.25 mg, 0.25 mg, Intravenous,  Once, 3 of 6 cycles Administration: 0.25 mg (07/10/2018), 0.25 mg (07/31/2018), 0.25 mg (08/24/2018) CARBOplatin (PARAPLATIN) 480 mg in sodium chloride 0.9 % 250 mL chemo infusion, 480 mg (100 % of original dose 482.5 mg), Intravenous,  Once, 3 of 6 cycles Dose modification: 482.5 mg (original dose 482.5 mg, Cycle 1) Administration: 480 mg (07/10/2018), 480 mg (07/31/2018), 480 mg (08/24/2018) PACLitaxel (TAXOL) 276 mg in sodium chloride 0.9 % 250 mL chemo infusion (> 825mm2), 140 mg/m2 = 276 mg (80 % of original dose 175 mg/m2), Intravenous,  Once, 3 of 6 cycles Dose modification: 140 mg/m2 (80 % of original dose 175 mg/m2, Cycle 1, Reason: Dose Not  Tolerated) Administration: 276 mg (07/10/2018), 276 mg (07/31/2018), 276 mg (08/24/2018) fosaprepitant (EMEND) 150 mg, dexamethasone (DECADRON) 12 mg in sodium chloride 0.9 % 145 mL IVPB, , Intravenous,  Once, 3 of 6 cycles Administration:  (07/10/2018),  (07/31/2018),  (08/24/2018)   for chemotherapy treatment.     11/06/2018 -  Chemotherapy   The patient had pembrolizumab for chemotherapy treatment.     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       PHYSICAL EXAMINATION: ECOG PERFORMANCE STATUS: 1 - Symptomatic but completely ambulatory  Vitals:   10/20/20 1124  BP: (!) 121/57  Pulse: 87  Resp: 18  Temp: (!) 97.4 F (36.3 C)  SpO2: 100%   Filed Weights   10/20/20 1124  Weight: 225 lb (102.1 kg)    GENERAL:alert, no distress and comfortable SKIN: skin color, texture, turgor are normal, no rashes or significant lesions EYES: normal, Conjunctiva are pink  and non-injected, sclera clear OROPHARYNX:no exudate, no erythema and lips, buccal mucosa, and tongue normal  NECK: supple, thyroid normal size, non-tender, without nodularity LYMPH:  no palpable lymphadenopathy in the cervical, axillary or inguinal LUNGS: clear to auscultation and percussion with normal breathing effort HEART: regular rate & rhythm and no murmurs and no lower extremity edema ABDOMEN:abdomen soft, non-tender and normal bowel sounds Musculoskeletal:no cyanosis of digits and no clubbing  NEURO: alert & oriented x 3 with fluent speech, no focal motor/sensory deficits  LABORATORY DATA:  I have reviewed the data as listed    Component Value Date/Time   NA 140 10/20/2020 1050   K 3.8 10/20/2020 1050   CL 107 10/20/2020 1050   CO2 23 10/20/2020 1050   GLUCOSE 93  10/20/2020 1050   BUN 14 10/20/2020 1050   CREATININE 0.74 10/20/2020 1050   CREATININE 0.75 09/06/2020 1006   CALCIUM 9.1 10/20/2020 1050   PROT 7.2 10/20/2020 1050   ALBUMIN 3.6 10/20/2020 1050   AST 12 (L) 10/20/2020 1050   AST 13 (L) 09/06/2020 1006   ALT 8 10/20/2020 1050   ALT 10 09/06/2020 1006   ALKPHOS 133 (H) 10/20/2020 1050   BILITOT 0.4 10/20/2020 1050   BILITOT 0.5 09/06/2020 1006   GFRNONAA >60 10/20/2020 1050   GFRNONAA >60 09/06/2020 1006   GFRAA >60 11/12/2019 1222    No results found for: SPEP, UPEP  Lab Results  Component Value Date   WBC 3.9 (L) 10/20/2020   NEUTROABS 2.3 10/20/2020   HGB 11.2 (L) 10/20/2020   HCT 33.9 (L) 10/20/2020   MCV 90.2 10/20/2020   PLT 216 10/20/2020      Chemistry      Component Value Date/Time   NA 140 10/20/2020 1050   K 3.8 10/20/2020 1050   CL 107 10/20/2020 1050   CO2 23 10/20/2020 1050   BUN 14 10/20/2020 1050   CREATININE 0.74 10/20/2020 1050   CREATININE 0.75 09/06/2020 1006      Component Value Date/Time   CALCIUM 9.1 10/20/2020 1050   ALKPHOS 133 (H) 10/20/2020 1050   AST 12 (L) 10/20/2020 1050   AST 13 (L) 09/06/2020 1006   ALT 8 10/20/2020 1050   ALT 10 09/06/2020 1006   BILITOT 0.4 10/20/2020 1050   BILITOT 0.5 09/06/2020 1006

## 2020-10-20 NOTE — Patient Instructions (Signed)
[facility]  ° Discharge Instructions: °Thank you for choosing [facility] to provide your oncology and hematology care.  ° °If you have a lab appointment with the Cancer Center, please go directly to the Cancer Center and check in at the registration area. °  °Wear comfortable clothing and clothing appropriate for easy access to any Portacath or PICC line.  ° °We strive to give you quality time with your provider. You may need to reschedule your appointment if you arrive late (15 or more minutes).  Arriving late affects you and other patients whose appointments are after yours.  Also, if you miss three or more appointments without notifying the office, you may be dismissed from the clinic at the provider’s discretion.    °  °For prescription refill requests, have your pharmacy contact our office and allow 72 hours for refills to be completed.   ° °Today you received the following chemotherapy and/or immunotherapy agents: Pembrolizumab (Keytruda) °  °To help prevent nausea and vomiting after your treatment, we encourage you to take your nausea medication as directed. ° °BELOW ARE SYMPTOMS THAT SHOULD BE REPORTED IMMEDIATELY: °*FEVER GREATER THAN 100.4 F (38 °C) OR HIGHER °*CHILLS OR SWEATING °*NAUSEA AND VOMITING THAT IS NOT CONTROLLED WITH YOUR NAUSEA MEDICATION °*UNUSUAL SHORTNESS OF BREATH °*UNUSUAL BRUISING OR BLEEDING °*URINARY PROBLEMS (pain or burning when urinating, or frequent urination) °*BOWEL PROBLEMS (unusual diarrhea, constipation, pain near the anus) °TENDERNESS IN MOUTH AND THROAT WITH OR WITHOUT PRESENCE OF ULCERS (sore throat, sores in mouth, or a toothache) °UNUSUAL RASH, SWELLING OR PAIN  °UNUSUAL VAGINAL DISCHARGE OR ITCHING  ° °Items with * indicate a potential emergency and should be followed up as soon as possible or go to the Emergency Department if any problems should occur. ° °Please show the CHEMOTHERAPY ALERT CARD or IMMUNOTHERAPY ALERT CARD  at check-in to the Emergency Department and triage nurse. ° °Should you have questions after your visit or need to cancel or reschedule your appointment, please contact [facility]  Dept: [phone]  and follow the prompts.  Office hours are 8:00 a.m. to 4:30 p.m. Monday - Friday. Please note that voicemails left after 4:00 p.m. may not be returned until the following business day.  We are closed weekends and major holidays. You have access to a nurse at all times for urgent questions. Please call the main number to the clinic Dept: [phone] and follow the prompts. ° ° °For any non-urgent questions, you may also contact your provider using MyChart. We now offer e-Visits for anyone 18 and older to request care online for non-urgent symptoms. For details visit [email or web address]. °  °Also download the MyChart app! Go to the app store, search "MyChart", open the app, select [facility], and log in with your MyChart username and password. ° °Due to Covid, a mask is required upon entering the hospital/clinic. If you do not have a mask, one will be given to you upon arrival. For doctor visits, patients may have 1 support person aged 18 or older with them. For treatment visits, patients cannot have anyone with them due to current Covid guidelines and our immunocompromised population.  ° °

## 2020-10-20 NOTE — Assessment & Plan Note (Signed)
She is taking both anticoagulation therapy and antiplatelet agent She has no recent bleeding She will continue her medications as directed  

## 2020-10-20 NOTE — Assessment & Plan Note (Signed)
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

## 2020-10-20 NOTE — Patient Instructions (Signed)
Implanted Port Home Guide An implanted port is a device that is placed under the skin. It is usually placed in the chest. The device can be used to give IV medicine, to take blood, or for dialysis. You may have an implanted port if: You need IV medicine that would be irritating to the small veins in your hands or arms. You need IV medicines, such as antibiotics, for a long period of time. You need IV nutrition for a long period of time. You need dialysis. When you have a port, your health care provider can choose to use the port instead of veins in your arms for these procedures. You may have fewer limitations when using a port than you would if you used other types of long-term IVs, and you will likely be able to return to normal activities after your incision heals. An implanted port has two main parts: Reservoir. The reservoir is the part where a needle is inserted to give medicines or draw blood. The reservoir is round. After it is placed, it appears as a small, raised area under your skin. Catheter. The catheter is a thin, flexible tube that connects the reservoir to a vein. Medicine that is inserted into the reservoir goes into the catheter and then into the vein. How is my port accessed? To access your port: A numbing cream may be placed on the skin over the port site. Your health care provider will put on a mask and sterile gloves. The skin over your port will be cleaned carefully with a germ-killing soap and allowed to dry. Your health care provider will gently pinch the port and insert a needle into it. Your health care provider will check for a blood return to make sure the port is in the vein and is not clogged. If your port needs to remain accessed to get medicine continuously (constant infusion), your health care provider will place a clear bandage (dressing) over the needle site. The dressing and needle will need to be changed every week, or as told by your health care provider. What  is flushing? Flushing helps keep the port from getting clogged. Follow instructions from your health care provider about how and when to flush the port. Ports are usually flushed with saline solution or a medicine called heparin. The need for flushing will depend on how the port is used: If the port is only used from time to time to give medicines or draw blood, the port may need to be flushed: Before and after medicines have been given. Before and after blood has been drawn. As part of routine maintenance. Flushing may be recommended every 4-6 weeks. If a constant infusion is running, the port may not need to be flushed. Throw away any syringes in a disposal container that is meant for sharp items (sharps container). You can buy a sharps container from a pharmacy, or you can make one by using an empty hard plastic bottle with a cover. How long will my port stay implanted? The port can stay in for as long as your health care provider thinks it is needed. When it is time for the port to come out, a surgery will be done to remove it. The surgery will be similar to the procedure that was done to put the port in. Follow these instructions at home:  Flush your port as told by your health care provider. If you need an infusion over several days, follow instructions from your health care provider about how   to take care of your port site. Make sure you: Wash your hands with soap and water before you change your dressing. If soap and water are not available, use alcohol-based hand sanitizer. Change your dressing as told by your health care provider. Place any used dressings or infusion bags into a plastic bag. Throw that bag in the trash. Keep the dressing that covers the needle clean and dry. Do not get it wet. Do not use scissors or sharp objects near the tube. Keep the tube clamped, unless it is being used. Check your port site every day for signs of infection. Check for: Redness, swelling, or  pain. Fluid or blood. Pus or a bad smell. Protect the skin around the port site. Avoid wearing bra straps that rub or irritate the site. Protect the skin around your port from seat belts. Place a soft pad over your chest if needed. Bathe or shower as told by your health care provider. The site may get wet as long as you are not actively receiving an infusion. Return to your normal activities as told by your health care provider. Ask your health care provider what activities are safe for you. Carry a medical alert card or wear a medical alert bracelet at all times. This will let health care providers know that you have an implanted port in case of an emergency. Get help right away if: You have redness, swelling, or pain at the port site. You have fluid or blood coming from your port site. You have pus or a bad smell coming from the port site. You have a fever. Summary Implanted ports are usually placed in the chest for long-term IV access. Follow instructions from your health care provider about flushing the port and changing bandages (dressings). Take care of the area around your port by avoiding clothing that puts pressure on the area, and by watching for signs of infection. Protect the skin around your port from seat belts. Place a soft pad over your chest if needed. Get help right away if you have a fever or you have redness, swelling, pain, drainage, or a bad smell at the port site. This information is not intended to replace advice given to you by your health care provider. Make sure you discuss any questions you have with your health care provider. Document Revised: 04/26/2020 Document Reviewed: 06/21/2019 [employer] Patient Education  2022 [employer]  

## 2020-10-20 NOTE — Assessment & Plan Note (Signed)
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

## 2020-11-10 ENCOUNTER — Other Ambulatory Visit: Payer: Self-pay

## 2020-11-10 ENCOUNTER — Inpatient Hospital Stay (HOSPITAL_BASED_OUTPATIENT_CLINIC_OR_DEPARTMENT_OTHER): Payer: Medicare Other | Admitting: Hematology and Oncology

## 2020-11-10 ENCOUNTER — Inpatient Hospital Stay: Payer: Medicare Other

## 2020-11-10 ENCOUNTER — Encounter: Payer: Self-pay | Admitting: Hematology and Oncology

## 2020-11-10 VITALS — BP 125/73 | HR 78 | Temp 97.7°F | Resp 18 | Ht 61.0 in | Wt 228.2 lb

## 2020-11-10 DIAGNOSIS — C541 Malignant neoplasm of endometrium: Secondary | ICD-10-CM | POA: Diagnosis not present

## 2020-11-10 DIAGNOSIS — C7951 Secondary malignant neoplasm of bone: Secondary | ICD-10-CM

## 2020-11-10 DIAGNOSIS — C55 Malignant neoplasm of uterus, part unspecified: Secondary | ICD-10-CM | POA: Diagnosis not present

## 2020-11-10 DIAGNOSIS — C801 Malignant (primary) neoplasm, unspecified: Secondary | ICD-10-CM

## 2020-11-10 DIAGNOSIS — N3090 Cystitis, unspecified without hematuria: Secondary | ICD-10-CM

## 2020-11-10 DIAGNOSIS — Z7189 Other specified counseling: Secondary | ICD-10-CM

## 2020-11-10 DIAGNOSIS — E785 Hyperlipidemia, unspecified: Secondary | ICD-10-CM

## 2020-11-10 DIAGNOSIS — G893 Neoplasm related pain (acute) (chronic): Secondary | ICD-10-CM

## 2020-11-10 DIAGNOSIS — C774 Secondary and unspecified malignant neoplasm of inguinal and lower limb lymph nodes: Secondary | ICD-10-CM

## 2020-11-10 DIAGNOSIS — E039 Hypothyroidism, unspecified: Secondary | ICD-10-CM

## 2020-11-10 LAB — CBC WITH DIFFERENTIAL/PLATELET
Abs Immature Granulocytes: 0.02 10*3/uL (ref 0.00–0.07)
Basophils Absolute: 0 10*3/uL (ref 0.0–0.1)
Basophils Relative: 1 %
Eosinophils Absolute: 0.2 10*3/uL (ref 0.0–0.5)
Eosinophils Relative: 5 %
HCT: 34.4 % — ABNORMAL LOW (ref 36.0–46.0)
Hemoglobin: 11.3 g/dL — ABNORMAL LOW (ref 12.0–15.0)
Immature Granulocytes: 1 %
Lymphocytes Relative: 25 %
Lymphs Abs: 0.9 10*3/uL (ref 0.7–4.0)
MCH: 29.6 pg (ref 26.0–34.0)
MCHC: 32.8 g/dL (ref 30.0–36.0)
MCV: 90.1 fL (ref 80.0–100.0)
Monocytes Absolute: 0.4 10*3/uL (ref 0.1–1.0)
Monocytes Relative: 11 %
Neutro Abs: 2 10*3/uL (ref 1.7–7.7)
Neutrophils Relative %: 57 %
Platelets: 209 10*3/uL (ref 150–400)
RBC: 3.82 MIL/uL — ABNORMAL LOW (ref 3.87–5.11)
RDW: 13.3 % (ref 11.5–15.5)
WBC: 3.4 10*3/uL — ABNORMAL LOW (ref 4.0–10.5)
nRBC: 0 % (ref 0.0–0.2)

## 2020-11-10 LAB — COMPREHENSIVE METABOLIC PANEL
ALT: 9 U/L (ref 0–44)
AST: 14 U/L — ABNORMAL LOW (ref 15–41)
Albumin: 3.6 g/dL (ref 3.5–5.0)
Alkaline Phosphatase: 131 U/L — ABNORMAL HIGH (ref 38–126)
Anion gap: 11 (ref 5–15)
BUN: 14 mg/dL (ref 8–23)
CO2: 23 mmol/L (ref 22–32)
Calcium: 9.5 mg/dL (ref 8.9–10.3)
Chloride: 106 mmol/L (ref 98–111)
Creatinine, Ser: 0.82 mg/dL (ref 0.44–1.00)
GFR, Estimated: >60 mL/min (ref >60)
Glucose, Bld: 91 mg/dL (ref 70–99)
Potassium: 4.1 mmol/L (ref 3.5–5.1)
Sodium: 140 mmol/L (ref 135–145)
Total Bilirubin: 0.5 mg/dL (ref 0.3–1.2)
Total Protein: 7.4 g/dL (ref 6.5–8.1)

## 2020-11-10 LAB — URINALYSIS, COMPLETE (UACMP) WITH MICROSCOPIC
Bacteria, UA: NONE SEEN
Bilirubin Urine: NEGATIVE
Glucose, UA: NEGATIVE mg/dL
Ketones, ur: NEGATIVE mg/dL
Nitrite: NEGATIVE
Protein, ur: NEGATIVE mg/dL
Specific Gravity, Urine: 1.01 (ref 1.005–1.030)
WBC, UA: >50 WBC/hpf — ABNORMAL HIGH (ref 0–5)
pH: 7 (ref 5.0–8.0)

## 2020-11-10 LAB — TSH: TSH: 1.543 u[IU]/mL (ref 0.308–3.960)

## 2020-11-10 MED ORDER — SODIUM CHLORIDE 0.9 % IV SOLN
200.0000 mg | Freq: Once | INTRAVENOUS | Status: AC
  Administered 2020-11-10: 200 mg via INTRAVENOUS
  Filled 2020-11-10: qty 8

## 2020-11-10 MED ORDER — SODIUM CHLORIDE 0.9 % IV SOLN
Freq: Once | INTRAVENOUS | Status: AC
Start: 2020-11-10 — End: 2020-11-10

## 2020-11-10 MED ORDER — SODIUM CHLORIDE 0.9% FLUSH
10.0000 mL | Freq: Once | INTRAVENOUS | Status: AC
Start: 2020-11-10 — End: 2020-11-10
  Administered 2020-11-10: 10 mL

## 2020-11-10 MED ORDER — SODIUM CHLORIDE 0.9% FLUSH
10.0000 mL | INTRAVENOUS | Status: DC | PRN
  Administered 2020-11-10: 10 mL

## 2020-11-10 MED ORDER — HEPARIN SOD (PORK) LOCK FLUSH 100 UNIT/ML IV SOLN
500.0000 [IU] | Freq: Once | INTRAVENOUS | Status: AC | PRN
  Administered 2020-11-10: 500 [IU]

## 2020-11-10 NOTE — Progress Notes (Signed)
[place] OFFICE PROGRESS NOTE  Patient Care Team: Nolene Ebbs, MD as PCP - General (Internal Medicine)  ASSESSMENT & PLAN:  Uterine cancer [facility]) Her last CT showed stable disease control Per previous discussion, the plan would be to continue pembrolizumab indefinitely She has no side effects from treatment so far I recommend CT imaging every 6 months, next will be due early January  Cystitis She has symptoms of cystitis I will order urinalysis and urine culture and we will call her with test results next week  Cancer associated pain She has less pain Part of her pain is due to 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 The patient knows to call the clinic with any problems, questions or concerns. The total time spent in the appointment was 20 minutes encounter with patients including review of chart and various tests results, discussions about plan of care and coordination of care plan   [name], MD 11/10/2020 12:17 PM  INTERVAL HISTORY: Please see below for problem oriented charting. she returns for treatment follow-up with [facility] for urine cancer She tolerated recent treatment well She complained of some recent dysuria Leg swelling is stable Her chronic pain is stable Denies recent bleeding  REVIEW OF SYSTEMS:   Constitutional: Denies fevers, chills or abnormal weight loss Eyes: Denies blurriness of vision Ears, nose, mouth, throat, and face: Denies mucositis or sore  throat Respiratory: Denies cough, dyspnea or wheezes Cardiovascular: Denies palpitation, chest discomfort or lower extremity swelling Gastrointestinal:  Denies nausea, heartburn or change in bowel habits Skin: Denies abnormal skin rashes Lymphatics: Denies new lymphadenopathy or easy bruising Neurological:Denies numbness, tingling or new weaknesses Behavioral/Psych: Mood is stable, no new changes  All other systems were reviewed with the patient and are negative.  I have reviewed the past medical history, past surgical history, social history and family history with the patient and they are unchanged from previous note.  ALLERGIES:  has No Known Allergies.  MEDICATIONS:  Current Outpatient Medications  Medication Sig Dispense Refill   apixaban (ELIQUIS) 2.5 MG TABS tablet Take by mouth 2 (two) times daily.     diclofenac sodium (VOLTAREN) 1 % GEL APPLY 4GRAMS 4 TIMES A DAY AS NEEDED FOR PAINS     gabapentin (NEURONTIN) 300 MG capsule Take 300 mg by mouth 2 (two) times daily.     lidocaine-prilocaine (EMLA) cream Apply 1 application topically daily as needed. 30 g 3   methadone (DOLOPHINE) 10 MG tablet Take 1 tablet (10 mg total) by mouth every 12 (twelve) hours. 60 tablet 0   morphine (MSIR) 15 MG tablet Take 1 tablet (15 mg total) by mouth every 6 (six) hours as needed for severe pain. 60 tablet 0   Olopatadine HCl 0.2 % SOLN Place 1 drop into both eyes daily.     No current facility-administered medications for this visit.  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 Uterus +/- tubes/ovaries, neoplastic, with left fallopian tube and ovary - INVASIVE ENDOMETRIOID CARCINOMA (1.5 CM), FIGO GRADE II, ARISING IN A BACKGROUND OF ATYPICAL COMPLEX HYPERPLASIA, CONFINED WITHIN  INNER HALF OF THE MYOMETRIUM. - ENDOMETRIAL POLYP WITH ASSOCIATED ATYPICAL COMPLEX HYPERPLASIA. - MYOMETRIUM: LEIOMYOMATA. - CERVIX: BENIGN SQUAMOUS MUCOSA AND ENDOCERVICAL MUCOSA, NO DYSPLASIA OR MALIGNANCY. - LEFT OVARY: BENIGN OVARIAN TISSUE WITH ENDOSALPINGOSIS, NO EVIDENCE OF ATYPIA OR MALIGNANCY. - LEFT FALLOPIAN TUBE: NO HISTOLOGIC ABNORMALITIES. - PLEASE SEE ONCOLOGY TEMPLATE FOR DETAIL. 2. Ovary and fallopian tube, right - BENIGN OVARIAN TISSUE WITH ENDOSALPINGOSIS, NO ATYPIA OR MALIGNANCY. - BENIGN FALLOPIAN TUBAL TISSUE, NO PATHOLOGIC ABNORMALITIES. Microscopic Comment 1. UTERUS Specimen: Uterus, cervix, bilateral ovaries and fallopian tubes Procedure: Total hysterectomy and bilateral salpingo-oophorectomy Lymph node sampling performed: No Specimen integrity: Intact Maximum tumor size (cm): 1.5 cm, glass slide measurement Histologic type: Invasive endometrioid carcinoma Grade: FIGO grade II Myometrial invasion: 1 cm where myometrium is 2.3 cm in thickness Cervical stromal involvement: No Extent of involvement of other organs: No Lymph vascular invasion: Not identified Peritoneal washings: Negative ([id number]) Lymph nodes: number examined N/A; number positive N/A TNM code: pT1a, pNX 1 oFf 3IGO Stage (based on pathologic findings, needs clinical correlation): IA  Comments: Sections the endomyometrium away from the grossly identified endometrial polyp show an invasive FIGO grade II endometrioid carcinoma. The tumor is confined within inner half of the myometrium. No angiolymphatic invasion is identified. No cervical stromal involvement is identified. Sections of the grossly identified endometrial polyp show an endometrial polyp with associated atypical compacted hyperplasia with no definitive evidence of carcinoma.   12/07/2017 Imaging   US venous Doppler Right: No evidence of common femoral vein obstruction. Left: Findings consistent with acute deep vein thrombosis  involving the left femoral vein, left proximal profunda vein, and left popliteal vein. Unable to adequately interrogate the common femoral and higher, or the calf secondary to significant edema and body habitus   12/07/2017 [facility] Admission   She presented to the ER and was diagnosed with acute DVT   01/18/2018 - 01/21/2018 Hospital Admission   She was admitted to the hospital for management of severe persistent DVT   01/18/2018 Imaging   US venous Doppler Right: No evidence of common femoral vein obstruction. Left: Findings consistent with acute deep vein thrombosis involving the left common femoral vein, and left popliteal vein.   01/19/2018 Surgery   Pre-operative Diagnosis: Subacute DVT with severe post thrombotic syndrome Post-operative diagnosis:  Same Surgeon:  Erlene Quan C. Donzetta Matters, MD Procedure Performed: 1.  Ultrasound-guided cannulation left small saphenous vein 2.  Left lower extremity and central venography 3.  Intravascular ultrasound of left popliteal, femoral, common femoral, external and common iliac veins and IVC 4.  Stent of left common and external iliac veins with 14 x 60 mm Vici 5.  Moderate sedation with fentanyl and Versed for 50 minutes   Indications: 75 year old female with a history of DVT in October now presents with persistent left lower extremity swelling and ultrasound demonstrating likely persistent DVT.  She has been on Xarelto at this time.  She is now indicated for venogram possible intervention.   Findings: Flow in the left lower extremity was stagnant throughout but by venogram all veins were patent.  There  was a focal occlusive area approximately 2 cm in length at the common and external iliac vein junction at the hypogastric on the left.  After stenting and ballooning we had a diameter of 12 millimeters in the stent and venogram demonstrated flow in the lower extremity veins were previously was stagnant and no further residual stenosis in the left common  and external iliac vein junction.   04/12/2018 Imaging   US Venous Doppler Right: No evidence of common femoral vein obstruction. Left: There is no evidence of deep vein thrombosis in the lower extremity. However, portions of this examination were limited- see technologist comments above. Left groin: Large hypoechoic area with mixed echoes noted measuring nearly 10 cm. Possible  hematoma versus unknown etiology. Ultrasound characteristics of enlarged lymph nodes noted in the groin.      05/15/2018 Imaging   US Venous Doppler Right: No evidence of deep vein thrombosis in the lower extremity. No indirect evidence of obstruction proximal to the inguinal ligament. Left: No reflux was noted in the common femoral vein , femoral vein in the thigh, popliteal vein, great saphenous vein at the saphenofemoral junction, great saphenous vein at the proximal thigh, great saphenous vein at the mid thigh, great saphenous vein  at the distal thigh, great saphenous vein at the knee, origin of the small saphenous vein, proximal small saphenous vein, and mid small saphenous vein. There is no evidence of deep vein thrombosis in the lower extremity. There is no evidence of superficial venous thrombosis. No cystic structure found in the popliteal fossa. Unable to evaluate extension of common femoral vein obstruction proximal to the inguinal ligament.   06/01/2018 Imaging   1. 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 Additionally, there is lucency involving the medial sidewall of the left acetabulum with potential nondisplaced pathologic fracture. Further evaluation with contrast-enhanced pelvic MRI could be performed as clinically indicated. 2. The left pelvic arterial and venous system is encased by this infiltrative left pelvic sidewall mass however while difficult to ascertain, the left external iliac venous stent  appears patent.   06/18/2018 Pathology Results   Lymph node for lymphoma, Left Inguinal - METASTATIC ADENOCARCINOMA, SEE COMMENT. Microscopic Comment Immunohistochemistry is positive for cytokeratin 7, PAX8, ER, and PR. Cytokeratin 5/6,and p63 are negative. The immunoprofile along with the patient's history are consistent with a gynecologic primary.   06/18/2018 Surgery   Pre-op Diagnosis: INGUINAL LYMPHADENOPATHY, PELVIC MASS      Procedure(s): EXCISIONAL BIOPSY DEEP LEFT INGUINAL LYMPH NODE   Surgeon(s): Coralie Keens, MD      06/24/2018 Cancer Staging   Staging form: Corpus Uteri - Carcinoma and Carcinosarcoma, AJCC 8th Edition - Clinical: Stage IVB (cT1a, cN2, pM1) - Signed by [name], MD on 06/24/2018    Genetic Testing   Patient has genetic testing done for MMR on pathology from 06/18/2018. Results revealed patient has the following mutation(s): MMR: abnormal   06/29/2018 Procedure   Placement of a subcutaneous port device. Catheter tip at the SVC and right atrium junction.    Genetic Testing   Patient has genetic testing done for MSI on pathology from 06/18/2018. Results revealed patient has the following mutation(s): MSI: High   07/02/2018 PET scan   Previous hysterectomy, with asymmetric focus of hypermetabolic activity in the left vaginal cuff, suspicious for residual or recurrent carcinoma.   Large hypermetabolic soft tissue mass involving the left pelvic sidewall and acetabulum, consistent with metastatic disease.  No evidence metastatic disease within the abdomen, chest, or neck.   07/09/2018 Tumor Marker   Patient's tumor was tested for the following markers: CA-125 Results of the tumor marker test revealed 9   07/10/2018 - 08/24/2018 Chemotherapy   The patient had carboplatin and taxol x 3 cycles   07/17/2018 Genetic Testing   Negative genetic testing on the common hereditary cancer panel.  The Common Hereditary Gene Panel offered by [facility] includes  sequencing and/or deletion duplication testing of the following 48 genes: APC, ATM, AXIN2, BARD1, BMPR1A, BRCA1, BRCA2, BRIP1, CDH1, CDK4, CDKN2A (p14ARF), CDKN2A (p16INK4a), CHEK2, CTNNA1, DICER1, EPCAM (Deletion/duplication testing only), GREM1 (promoter region deletion/duplication testing only), KIT, MEN1, MLH1, MSH2, MSH3, MSH6, MUTYH, NBN, NF1, NHTL1, PALB2, PDGFRA, PMS2, POLD1, POLE, PTEN, RAD50, RAD51C, RAD51D, RNF43, SDHB, SDHC, SDHD, SMAD4, SMARCA4. STK11, TP53, TSC1, TSC2, and VHL.  The following genes were evaluated for sequence changes only: SDHA and HOXB13 c.251G>A variant only. The report date is Jul 17, 2018.    10/03/2018 Imaging   CT abdomen and pelvis 1.  No acute intra-abdominal process. 2. Grossly unchanged left pelvic sidewall mass with osseous involvement of the medial acetabulum. Progressive mild displacement of the associated comminuted pathologic fracture involving the right acetabulum and puboacetabular junction.  3. New venous stents extending from the left common iliac vein origin to the proximal left common femoral vein. The stents are patent.   11/06/2018 -  Chemotherapy   The patient had pembrolizumab for chemotherapy treatment.     01/28/2019 Imaging   1. No substantial interval change in exam. 2. Interval development of mild fullness in the left intrarenal collecting system and ureter without overt hydronephrosis at this time. 3. Abnormal soft tissue along the left pelvic sidewall has decreased slightly in the interval. 4. Similar appearance of ill-defined fascial planes in the pelvis with some peritoneal thickening along the right pelvic sidewall and potentially involving the sigmoid mesocolon. 5. No substantial ascites.   05/03/2019 Imaging   1. Stable mild left pelvic sidewall soft tissue density. No new or progressive disease identified within the abdomen or pelvis.  2. Colonic diverticulosis. No radiographic evidence of diverticulitis.   Aortic  Atherosclerosis (ICD10-I70.0).   09/09/2019 Imaging   1. No change in appearance of soft tissue thickening along the LEFT pelvic sidewall adjacent to chronic LEFT acetabular fracture. 2. Mild asymmetry of the bladder wall favoring the LEFT bladder wall, not well assessed. Similar accounting for variable degrees of distension on prior studies potentially related to prior radiation, attention on follow-up. 3. Signs of venous stenting in the LEFT hemipelvis with LEFT lower extremity muscular atrophy and mild stranding with similar appearance. Signs of colonic diverticulosis and diverticular disease without change.   02/24/2020 Imaging   1. Unchanged appearance of the pelvis as detailed below. 2. Unchanged soft tissue thickening of the left pelvic sidewall. 3. 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 4. No evidence discrete mass or lymphadenopathy nor metastatic disease in the abdomen or pelvis. 5. Status post hysterectomy and cholecystectomy. 6. Left common iliac vein stent. 7. Pancolonic diverticulosis.     09/07/2020 Imaging   Stable abnormal soft tissue density in the left pelvic sidewall. No new or progressive disease within the abdomen or pelvis.   Colonic diverticulosis. No radiographic evidence of diverticulitis.   Stable severe destructive left hip arthropathy with fracture involving the medial acetabular wall.     Metastasis  to lymph nodes ([place])  06/23/2018 Initial Diagnosis   Metastasis to lymph nodes ([place])   07/10/2018 - 08/24/2018 Chemotherapy   The patient had palonosetron (ALOXI) injection 0.25 mg, 0.25 mg, Intravenous,  Once, 3 of 6 cycles Administration: 0.25 mg (07/10/2018), 0.25 mg (07/31/2018), 0.25 mg (08/24/2018) CARBOplatin (PARAPLATIN) 480 mg in sodium chloride 0.9 % 250 mL chemo infusion, 480 mg (100 % of original dose 482.5 mg), Intravenous,  Once, 3 of 6 cycles Dose  modification: 482.5 mg (original dose 482.5 mg, Cycle 1) Administration: 480 mg (07/10/2018), 480 mg (07/31/2018), 480 mg (08/24/2018) PACLitaxel (TAXOL) 276 mg in sodium chloride 0.9 % 250 mL chemo infusion (> 70m/m2), 140 mg/m2 = 276 mg (80 % of original dose 175 mg/m2), Intravenous,  Once, 3 of 6 cycles Dose modification: 140 mg/m2 (80 % of original dose 175 mg/m2, Cycle 1, Reason: Dose Not Tolerated) Administration: 276 mg (07/10/2018), 276 mg (07/31/2018), 276 mg (08/24/2018) fosaprepitant (EMEND) 150 mg, dexamethasone (DECADRON) 12 mg in sodium chloride 0.9 % 145 mL IVPB, , Intravenous,  Once, 3 of 6 cycles Administration:  (07/10/2018),  (07/31/2018),  (08/24/2018)   for chemotherapy treatment.     11/06/2018 -  Chemotherapy   The patient had pembrolizumab for chemotherapy treatment.     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  06/24/2018 Initial Diagnosis   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   07/10/2018 - 08/24/2018 Chemotherapy   The patient had palonosetron (ALOXI) injection 0.25 mg, 0.25 mg, Intravenous,  Once, 3 of 6 cycles Administration: 0.25 mg (07/10/2018), 0.25 mg (07/31/2018), 0.25 mg (08/24/2018) CARBOplatin (PARAPLATIN) 480 mg in sodium chloride 0.9 % 250 mL chemo infusion, 480 mg (100 % of original dose 482.5 mg), Intravenous,  Once, 3 of 6 cycles Dose modification: 482.5 mg (original dose 482.5 mg, Cycle 1) Administration: 480 mg (07/10/2018), 480 mg (07/31/2018), 480 mg (08/24/2018) PACLitaxel (TAXOL) 276 mg in sodium chloride 0.9 % 250 mL chemo infusion (> 827mm2), 140 mg/m2 = 276 mg (80 % of original dose 175 mg/m2), Intravenous,  Once, 3 of 6 cycles Dose modification: 140 mg/m2 (80 % of original dose 175 mg/m2, Cycle 1, Reason: Dose Not Tolerated) Administration: 276 mg (07/10/2018), 276 mg (07/31/2018), 276 mg (08/24/2018) fosaprepitant (EMEND) 150 mg, dexamethasone (DECADRON) 12 mg in sodium chloride 0.9 % 145 mL IVPB, , Intravenous,  Once, 3 of 6 cycles Administration:  (07/10/2018),  (07/31/2018),   (08/24/2018)   for chemotherapy treatment.     11/06/2018 -  Chemotherapy   The patient had pembrolizumab for chemotherapy treatment.     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       PHYSICAL EXAMINATION: ECOG PERFORMANCE STATUS: 1 - Symptomatic but completely ambulatory  Vitals:   11/10/20 1200  BP: 125/73  Pulse: 78  Resp: 18  Temp: 97.7 F (36.5 C)  SpO2: 100%   Filed Weights   11/10/20 1200  Weight: 228 lb 3.2 oz (103.5 kg)    GENERAL:alert, no distress and comfortable SKIN: skin color, texture, turgor are normal, no rashes or significant lesions EYES: normal, Conjunctiva are pink and non-injected, sclera clear OROPHARYNX:no exudate, no erythema and lips, buccal mucosa, and tongue normal  NECK: supple, thyroid normal size, non-tender, without nodularity LYMPH:  no palpable lymphadenopathy in the cervical, axillary or inguinal LUNGS: clear to auscultation and percussion with normal breathing  effort HEART: regular rate & rhythm and no murmurs and no lower extremity edema ABDOMEN:abdomen soft, non-tender and normal bowel sounds Musculoskeletal:no cyanosis of digits and no clubbing  NEURO: alert & oriented x 3 with fluent speech, no focal motor/sensory deficits  LABORATORY DATA:  I have reviewed the data as listed    Component Value Date/Time   NA 140 10/20/2020 1050   K 3.8 10/20/2020 1050   CL 107 10/20/2020 1050   CO2 23 10/20/2020 1050   GLUCOSE 93 10/20/2020 1050   BUN 14 10/20/2020 1050   CREATININE 0.74 10/20/2020 1050   CREATININE 0.75 09/06/2020 1006   CALCIUM 9.1 10/20/2020 1050   PROT 7.2 10/20/2020 1050   ALBUMIN 3.6 10/20/2020 1050   AST 12 (L) 10/20/2020 1050   AST 13 (L) 09/06/2020 1006   ALT  8 10/20/2020 1050   ALT 10 09/06/2020 1006   ALKPHOS 133 (H) 10/20/2020 1050   BILITOT 0.4 10/20/2020 1050   BILITOT 0.5 09/06/2020 1006   GFRNONAA >60 10/20/2020 1050   GFRNONAA >60 09/06/2020 1006   GFRAA >60 11/12/2019 1222    No results found for: SPEP, UPEP  Lab Results  Component Value Date   WBC 3.4 (L) 11/10/2020   NEUTROABS 2.0 11/10/2020   HGB 11.3 (L) 11/10/2020   HCT 34.4 (L) 11/10/2020   MCV 90.1 11/10/2020   PLT 209 11/10/2020      Chemistry      Component Value Date/Time   NA 140 10/20/2020 1050   K 3.8 10/20/2020 1050   CL 107 10/20/2020 1050   CO2 23 10/20/2020 1050   BUN 14 10/20/2020 1050   CREATININE 0.74 10/20/2020 1050   CREATININE 0.75 09/06/2020 1006      Component Value Date/Time   CALCIUM 9.1 10/20/2020 1050   ALKPHOS 133 (H) 10/20/2020 1050   AST 12 (L) 10/20/2020 1050   AST 13 (L) 09/06/2020 1006   ALT 8 10/20/2020 1050   ALT 10 09/06/2020 1006   BILITOT 0.4 10/20/2020 1050   BILITOT 0.5 09/06/2020 1006

## 2020-11-10 NOTE — Patient Instructions (Signed)
[facility] MEDICAL ONCOLOGY   ?Discharge Instructions: ?Thank you for choosing [facility] to provide your oncology and hematology care.  ? ?If you have a lab appointment with the Cancer Center, please go directly to the Cancer Center and check in at the registration area. ?  ?Wear comfortable clothing and clothing appropriate for easy access to any Portacath or PICC line.  ? ?We strive to give you quality time with your provider. You may need to reschedule your appointment if you arrive late (15 or more minutes).  Arriving late affects you and other patients whose appointments are after yours.  Also, if you miss three or more appointments without notifying the office, you may be dismissed from the clinic at the provider?s discretion.    ?  ?For prescription refill requests, have your pharmacy contact our office and allow 72 hours for refills to be completed.   ? ?Today you received the following chemotherapy and/or immunotherapy agents: pembrolizumab    ?  ?To help prevent nausea and vomiting after your treatment, we encourage you to take your nausea medication as directed. ? ?BELOW ARE SYMPTOMS THAT SHOULD BE REPORTED IMMEDIATELY: ?*FEVER GREATER THAN 100.4 F (38 ?C) OR HIGHER ?*CHILLS OR SWEATING ?*NAUSEA AND VOMITING THAT IS NOT CONTROLLED WITH YOUR NAUSEA MEDICATION ?*UNUSUAL SHORTNESS OF BREATH ?*UNUSUAL BRUISING OR BLEEDING ?*URINARY PROBLEMS (pain or burning when urinating, or frequent urination) ?*BOWEL PROBLEMS (unusual diarrhea, constipation, pain near the anus) ?TENDERNESS IN MOUTH AND THROAT WITH OR WITHOUT PRESENCE OF ULCERS (sore throat, sores in mouth, or a toothache) ?UNUSUAL RASH, SWELLING OR PAIN  ?UNUSUAL VAGINAL DISCHARGE OR ITCHING  ? ?Items with * indicate a potential emergency and should be followed up as soon as possible or go to the Emergency Department if any problems should occur. ? ?Please show the CHEMOTHERAPY ALERT CARD or IMMUNOTHERAPY ALERT CARD at  check-in to the Emergency Department and triage nurse. ? ?Should you have questions after your visit or need to cancel or reschedule your appointment, please contact [facility] MEDICAL ONCOLOG[facility]  Dept: [phone]  and follow the prompts.  Office hours are 8:00 a.m. to 4:30 p.m. Monday - Friday. Please note that voicemails left after 4:00 p.m. may not be returned until the following business day.  We are closed weekends and major holidays. You have access to a nurse at all times for urgent questions. Please call the main number to the clinic Dept: [phone] and follow the prompts. ? ? ?For any non-urgent questions, you may also contact your provider using MyChart. We now offer e-Visits for anyone 18 and older to request care online for non-urgent symptoms. For details visit [email or web address]. ?  ?Also download the MyChart app! Go to the app store, search "MyChart", open the app, select , and log in with your MyChart username and password. ? ?Due to Covid, a mask is required upon entering the hospital/clinic. If you do not have a mask, one will be given to you upon arrival. For doctor visits, patients may have 1 support person aged 18 or older with them. For treatment visits, patients cannot have anyone with them due to current Covid guidelines and our immunocompromised population.  ? ?

## 2020-11-10 NOTE — Assessment & Plan Note (Signed)
She has symptoms of cystitis I will order urinalysis and urine culture and we will call her with test results next week

## 2020-11-10 NOTE — Assessment & Plan Note (Signed)
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

## 2020-11-10 NOTE — Assessment & Plan Note (Signed)
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

## 2020-11-13 ENCOUNTER — Other Ambulatory Visit: Payer: Self-pay | Admitting: Hematology and Oncology

## 2020-11-13 ENCOUNTER — Telehealth: Payer: Self-pay

## 2020-11-13 LAB — URINE CULTURE: Culture: 80000 — AB

## 2020-11-13 MED ORDER — CEPHALEXIN 500 MG PO CAPS: 500.0000 mg | ORAL_CAPSULE | Freq: Two times a day (BID) | ORAL | 0 refills | Status: DC

## 2020-11-13 NOTE — Telephone Encounter (Signed)
-----   Message from [name], MD sent at 11/13/2020  7:57 AM EDT ----- Her urine culture was positive I sent antibiotics to her pharmacy, please call ehr

## 2020-11-13 NOTE — Telephone Encounter (Signed)
Called and given below message. She verbalized understanding. 

## 2020-11-30 ENCOUNTER — Encounter: Payer: Self-pay | Admitting: Hematology and Oncology

## 2020-11-30 ENCOUNTER — Inpatient Hospital Stay (HOSPITAL_BASED_OUTPATIENT_CLINIC_OR_DEPARTMENT_OTHER): Payer: Medicare Other | Admitting: Hematology and Oncology

## 2020-11-30 ENCOUNTER — Telehealth: Payer: Self-pay

## 2020-11-30 ENCOUNTER — Inpatient Hospital Stay: Payer: Medicare Other

## 2020-11-30 ENCOUNTER — Inpatient Hospital Stay: Payer: Medicare Other | Attending: Hematology and Oncology

## 2020-11-30 ENCOUNTER — Other Ambulatory Visit: Payer: Self-pay

## 2020-11-30 DIAGNOSIS — G893 Neoplasm related pain (acute) (chronic): Secondary | ICD-10-CM

## 2020-11-30 DIAGNOSIS — E785 Hyperlipidemia, unspecified: Secondary | ICD-10-CM

## 2020-11-30 DIAGNOSIS — I825Z2 Chronic embolism and thrombosis of unspecified deep veins of left distal lower extremity: Secondary | ICD-10-CM | POA: Diagnosis not present

## 2020-11-30 DIAGNOSIS — C55 Malignant neoplasm of uterus, part unspecified: Secondary | ICD-10-CM | POA: Diagnosis not present

## 2020-11-30 DIAGNOSIS — Z5112 Encounter for antineoplastic immunotherapy: Secondary | ICD-10-CM | POA: Diagnosis not present

## 2020-11-30 DIAGNOSIS — Z79899 Other long term (current) drug therapy: Secondary | ICD-10-CM | POA: Diagnosis not present

## 2020-11-30 DIAGNOSIS — C7951 Secondary malignant neoplasm of bone: Secondary | ICD-10-CM

## 2020-11-30 DIAGNOSIS — E039 Hypothyroidism, unspecified: Secondary | ICD-10-CM

## 2020-11-30 DIAGNOSIS — D61818 Other pancytopenia: Secondary | ICD-10-CM | POA: Diagnosis not present

## 2020-11-30 DIAGNOSIS — C774 Secondary and unspecified malignant neoplasm of inguinal and lower limb lymph nodes: Secondary | ICD-10-CM

## 2020-11-30 DIAGNOSIS — C541 Malignant neoplasm of endometrium: Secondary | ICD-10-CM | POA: Diagnosis present

## 2020-11-30 DIAGNOSIS — Z7189 Other specified counseling: Secondary | ICD-10-CM

## 2020-11-30 DIAGNOSIS — C801 Malignant (primary) neoplasm, unspecified: Secondary | ICD-10-CM

## 2020-11-30 LAB — CBC WITH DIFFERENTIAL/PLATELET
Abs Immature Granulocytes: 0.01 10*3/uL (ref 0.00–0.07)
Basophils Absolute: 0 10*3/uL (ref 0.0–0.1)
Basophils Relative: 1 %
Eosinophils Absolute: 0.1 10*3/uL (ref 0.0–0.5)
Eosinophils Relative: 3 %
HCT: 34.6 % — ABNORMAL LOW (ref 36.0–46.0)
Hemoglobin: 11.5 g/dL — ABNORMAL LOW (ref 12.0–15.0)
Immature Granulocytes: 0 %
Lymphocytes Relative: 23 %
Lymphs Abs: 0.8 10*3/uL (ref 0.7–4.0)
MCH: 29.6 pg (ref 26.0–34.0)
MCHC: 33.2 g/dL (ref 30.0–36.0)
MCV: 88.9 fL (ref 80.0–100.0)
Monocytes Absolute: 0.4 10*3/uL (ref 0.1–1.0)
Monocytes Relative: 10 %
Neutro Abs: 2.3 10*3/uL (ref 1.7–7.7)
Neutrophils Relative %: 63 %
Platelets: 222 10*3/uL (ref 150–400)
RBC: 3.89 MIL/uL (ref 3.87–5.11)
RDW: 13.6 % (ref 11.5–15.5)
WBC: 3.6 10*3/uL — ABNORMAL LOW (ref 4.0–10.5)
nRBC: 0 % (ref 0.0–0.2)

## 2020-11-30 LAB — CMP (CANCER CENTER ONLY)
ALT: 9 U/L (ref 0–44)
AST: 13 U/L — ABNORMAL LOW (ref 15–41)
Albumin: 4 g/dL (ref 3.5–5.0)
Alkaline Phosphatase: 123 U/L (ref 38–126)
Anion gap: 6 (ref 5–15)
BUN: 14 mg/dL (ref 8–23)
CO2: 27 mmol/L (ref 22–32)
Calcium: 9.1 mg/dL (ref 8.9–10.3)
Chloride: 104 mmol/L (ref 98–111)
Creatinine: 0.57 mg/dL (ref 0.44–1.00)
GFR, Estimated: >60 mL/min (ref >60)
Glucose, Bld: 98 mg/dL (ref 70–99)
Potassium: 3.8 mmol/L (ref 3.5–5.1)
Sodium: 137 mmol/L (ref 135–145)
Total Bilirubin: 0.8 mg/dL (ref 0.3–1.2)
Total Protein: 7.5 g/dL (ref 6.5–8.1)

## 2020-11-30 LAB — LIPID PANEL
Cholesterol: 184 mg/dL (ref 0–200)
HDL: 104 mg/dL (ref >40)
LDL Cholesterol: 73 mg/dL (ref 0–99)
Total CHOL/HDL Ratio: 1.8 RATIO
Triglycerides: 34 mg/dL (ref <150)
VLDL: 7 mg/dL (ref 0–40)

## 2020-11-30 LAB — TSH: TSH: 1.569 u[IU]/mL (ref 0.308–3.960)

## 2020-11-30 MED ORDER — SODIUM CHLORIDE 0.9% FLUSH
10.0000 mL | Freq: Once | INTRAVENOUS | Status: AC
  Administered 2020-11-30: 10 mL

## 2020-11-30 MED ORDER — SODIUM CHLORIDE 0.9 % IV SOLN
200.0000 mg | Freq: Once | INTRAVENOUS | Status: AC
  Administered 2020-11-30: 200 mg via INTRAVENOUS
  Filled 2020-11-30: qty 8

## 2020-11-30 MED ORDER — SODIUM CHLORIDE 0.9% FLUSH
10.0000 mL | INTRAVENOUS | Status: DC | PRN
  Administered 2020-11-30: 10 mL

## 2020-11-30 MED ORDER — SODIUM CHLORIDE 0.9 % IV SOLN: Freq: Once | INTRAVENOUS | Status: AC

## 2020-11-30 MED ORDER — HEPARIN SOD (PORK) LOCK FLUSH 100 UNIT/ML IV SOLN
500.0000 [IU] | Freq: Once | INTRAVENOUS | Status: AC | PRN
  Administered 2020-11-30: 500 [IU]

## 2020-11-30 NOTE — Progress Notes (Signed)
[place] OFFICE PROGRESS NOTE  Patient Care Team: Nolene Ebbs, MD as PCP - General (Internal Medicine)  ASSESSMENT & PLAN:  Uterine cancer [facility]) Her last CT showed stable disease control Per previous discussion, the plan would be to continue pembrolizumab indefinitely She has no side effects from treatment so far I recommend CT imaging every 6 months, next will be due early January  Lower leg DVT (deep venous thromboembolism), chronic, left ([place]) She is taking both anticoagulation therapy and antiplatelet agent She has no recent bleeding She will continue her medications as directed  Pancytopenia, acquired ([place]) This is due to her treatment She is not symptomatic Observe  Cancer associated pain She has less pain Part of her pain is due to 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   All questions were answered. The patient knows to call the clinic with any problems, questions or concerns. The total time spent in the appointment was 20 minutes encounter with patients including review of chart and various tests results, discussions about plan of care and coordination of care plan   [name], MD 11/30/2020 1:17 PM  INTERVAL HISTORY: Please see below for problem oriented charting. she returns for treatment follow-up on pembrolizumab for recurrent uterine cancer, MSI high She is doing well Her recent cystitis has resolved No recent bleeding Her chronic pain is stable Denies infusion reaction  REVIEW OF SYSTEMS:   Constitutional: Denies fevers, chills or abnormal weight loss Eyes: Denies blurriness of vision Ears, nose, mouth, throat, and face: Denies mucositis or sore throat Respiratory: Denies cough, dyspnea or wheezes Cardiovascular: Denies palpitation, chest discomfort or lower extremity swelling Gastrointestinal:   Denies nausea, heartburn or change in bowel habits Skin: Denies abnormal skin rashes Lymphatics: Denies new lymphadenopathy or easy bruising Neurological:Denies numbness, tingling or new weaknesses Behavioral/Psych: Mood is stable, no new changes  All other systems were reviewed with the patient and are negative.  I have reviewed the past medical history, past surgical history, social history and family history with the patient and they are unchanged from previous note.  ALLERGIES:  has No Known Allergies.  MEDICATIONS:  Current Outpatient Medications  Medication Sig Dispense Refill   apixaban (ELIQUIS) 2.5 MG TABS tablet Take by mouth 2 (two) times daily.     cephALEXin (KEFLEX) 500 MG capsule Take 1 capsule (500 mg total) by mouth 2 (two) times daily. 14 capsule 0   diclofenac sodium (VOLTAREN) 1 % GEL APPLY 4GRAMS 4 TIMES A DAY AS NEEDED FOR PAINS     gabapentin (NEURONTIN) 300 MG capsule Take 300 mg by mouth 2 (two) times daily.     lidocaine-prilocaine (EMLA) cream Apply 1 application topically daily as needed. 30 g 3   methadone (DOLOPHINE) 10 MG tablet Take 1 tablet (10 mg total) by mouth every 12 (twelve) hours. 60 tablet 0   morphine (MSIR) 15 MG tablet Take 1 tablet (15 mg total) by mouth every 6 (six) hours as needed for severe pain. 60 tablet 0   Olopatadine HCl 0.2 % SOLN Place 1 drop into both eyes daily.     No current facility-administered medications for this visit.   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 Uterus +/- tubes/ovaries, neoplastic, with left fallopian tube and ovary - INVASIVE ENDOMETRIOID CARCINOMA (1.5 CM), FIGO GRADE II, ARISING IN A BACKGROUND OF ATYPICAL COMPLEX HYPERPLASIA, CONFINED WITHIN INNER HALF OF THE MYOMETRIUM. - ENDOMETRIAL POLYP WITH ASSOCIATED ATYPICAL COMPLEX HYPERPLASIA. - MYOMETRIUM: LEIOMYOMATA. - CERVIX: BENIGN SQUAMOUS MUCOSA AND ENDOCERVICAL MUCOSA, NO DYSPLASIA OR MALIGNANCY. - LEFT OVARY: BENIGN OVARIAN TISSUE WITH ENDOSALPINGOSIS, NO EVIDENCE OF ATYPIA OR MALIGNANCY. - LEFT FALLOPIAN TUBE: NO HISTOLOGIC ABNORMALITIES. - PLEASE SEE ONCOLOGY TEMPLATE FOR DETAIL. 2. Ovary and fallopian tube, right - BENIGN OVARIAN TISSUE WITH ENDOSALPINGOSIS, NO ATYPIA OR MALIGNANCY. - BENIGN FALLOPIAN TUBAL TISSUE, NO PATHOLOGIC ABNORMALITIES. Microscopic Comment 1. UTERUS Specimen: Uterus, cervix, bilateral ovaries and fallopian tubes Procedure: Total hysterectomy and bilateral salpingo-oophorectomy Lymph node sampling performed: No Specimen integrity: Intact Maximum tumor size (cm): 1.5 cm, glass slide measurement Histologic type: Invasive endometrioid carcinoma Grade: FIGO grade II Myometrial invasion: 1 cm where myometrium is 2.3 cm in thickness Cervical stromal involvement: No Extent of involvement of other organs: No Lymph vascular invasion: Not identified Peritoneal washings: Negative ([id number]) Lymph nodes: number examined N/A; number positive N/A TNM code: pT1a, pNX 1 oFf 3IGO Stage (based on pathologic findings, needs clinical correlation): IA  Comments: Sections the endomyometrium away from the grossly identified endometrial polyp show an invasive FIGO grade II endometrioid carcinoma. The tumor is confined within inner  half of the myometrium. No angiolymphatic invasion is identified. No cervical stromal involvement is identified. Sections of the grossly identified endometrial polyp show an endometrial polyp with associated atypical compacted hyperplasia with no definitive evidence of carcinoma.   12/07/2017 Imaging   US venous Doppler Right: No evidence of common femoral vein obstruction. Left: Findings consistent with acute deep vein thrombosis involving the left femoral vein, left proximal profunda vein, and left popliteal vein. Unable to adequately interrogate the common femoral and higher, or the calf secondary to significant edema and body habitus   12/07/2017 -  Hospital Admission   She presented to the ER and was diagnosed with acute DVT   01/18/2018 - 01/21/2018 Hospital Admission   She was admitted to the hospital for management of severe persistent DVT   01/18/2018 Imaging   US venous Doppler Right: No evidence of common femoral vein obstruction. Left: Findings consistent with acute deep vein thrombosis involving the left common femoral vein, and left popliteal vein.   01/19/2018 Surgery   Pre-operative Diagnosis: Subacute DVT with severe post thrombotic syndrome Post-operative diagnosis:  Same Surgeon:  Brandon C. Cain, MD Procedure Performed: 1.  Ultrasound-guided cannulation left small saphenous vein 2.  Left lower extremity and central venography 3.  Intravascular ultrasound of left popliteal, femoral, common femoral, external and common iliac veins and IVC 4.  Stent of left common and external iliac veins with 14 x 60 mm Vici 5.  Moderate sedation with fentanyl and Versed for 50 minutes   Indications: 72-year-old female with a history   of DVT in October now presents with persistent left lower extremity swelling and ultrasound demonstrating likely persistent DVT.  She has been on Xarelto at this time.  She is now indicated for venogram possible intervention.   Findings: Flow in the left  lower extremity was stagnant throughout but by venogram all veins were patent.  There was a focal occlusive area approximately 2 cm in length at the common and external iliac vein junction at the hypogastric on the left.  After stenting and ballooning we had a diameter of 12 millimeters in the stent and venogram demonstrated flow in the lower extremity veins were previously was stagnant and no further residual stenosis in the left common and external iliac vein junction.   04/12/2018 Imaging   US Venous Doppler Right: No evidence of common femoral vein obstruction. Left: There is no evidence of deep vein thrombosis in the lower extremity. However, portions of this examination were limited- see technologist comments above. Left groin: Large hypoechoic area with mixed echoes noted measuring nearly 10 cm. Possible  hematoma versus unknown etiology. Ultrasound characteristics of enlarged lymph nodes noted in the groin.      05/15/2018 Imaging   US Venous Doppler Right: No evidence of deep vein thrombosis in the lower extremity. No indirect evidence of obstruction proximal to the inguinal ligament. Left: No reflux was noted in the common femoral vein , femoral vein in the thigh, popliteal vein, great saphenous vein at the saphenofemoral junction, great saphenous vein at the proximal thigh, great saphenous vein at the mid thigh, great saphenous vein  at the distal thigh, great saphenous vein at the knee, origin of the small saphenous vein, proximal small saphenous vein, and mid small saphenous vein. There is no evidence of deep vein thrombosis in the lower extremity. There is no evidence of superficial venous thrombosis. No cystic structure found in the popliteal fossa. Unable to evaluate extension of common femoral vein obstruction proximal to the inguinal ligament.   06/01/2018 Imaging   1. Infiltrative mass within the left pelvic sidewall measuring approximately 9.5 cm with associated pathologically  enlarged left inguinal lymph node. Additionally, there is lucency involving the medial sidewall of the left acetabulum with potential nondisplaced pathologic fracture. Further evaluation with contrast-enhanced pelvic MRI could be performed as clinically indicated. 2. The left pelvic arterial and venous system is encased by this infiltrative left pelvic sidewall mass however while difficult to ascertain, the left external iliac venous stent appears patent.   06/18/2018 Pathology Results   Lymph node for lymphoma, Left Inguinal - METASTATIC ADENOCARCINOMA, SEE COMMENT. Microscopic Comment Immunohistochemistry is positive for cytokeratin 7, PAX8, ER, and PR. Cytokeratin 5/6,and p63 are negative. The immunoprofile along with the patient's history are consistent with a gynecologic primary.   06/18/2018 Surgery   Pre-op Diagnosis: INGUINAL LYMPHADENOPATHY, PELVIC MASS      Procedure(s): EXCISIONAL BIOPSY DEEP LEFT INGUINAL LYMPH NODE   Surgeon(s): Blackman, Douglas, MD      06/24/2018 Cancer Staging   Staging form: Corpus Uteri - Carcinoma and Carcinosarcoma, AJCC 8th Edition - Clinical: Stage IVB (cT1a, cN2, pM1) - Signed by Gorsuch, Ni, MD on 06/24/2018    Genetic Testing   Patient has genetic testing done for MMR on pathology from 06/18/2018. Results revealed patient has the following mutation(s): MMR: abnormal   06/29/2018 Procedure   Placement of a subcutaneous port device. Catheter tip at the SVC and right atrium junction.    Genetic Testing   Patient has genetic testing done for MSI   on pathology from 06/18/2018. Results revealed patient has the following mutation(s): MSI: High   07/02/2018 PET scan   Previous hysterectomy, with asymmetric focus of hypermetabolic activity in the left vaginal cuff, suspicious for residual or recurrent carcinoma.   Large hypermetabolic soft tissue mass involving the left pelvic sidewall and acetabulum, consistent with metastatic disease.   No evidence  metastatic disease within the abdomen, chest, or neck.   07/09/2018 Tumor Marker   Patient's tumor was tested for the following markers: CA-125 Results of the tumor marker test revealed 9   07/10/2018 - 08/24/2018 Chemotherapy   The patient had carboplatin and taxol x 3 cycles   07/17/2018 Genetic Testing   Negative genetic testing on the common hereditary cancer panel.  The Common Hereditary Gene Panel offered by [facility] includes sequencing and/or deletion duplication testing of the following 48 genes: APC, ATM, AXIN2, BARD1, BMPR1A, BRCA1, BRCA2, BRIP1, CDH1, CDK4, CDKN2A (p14ARF), CDKN2A (p16INK4a), CHEK2, CTNNA1, DICER1, EPCAM (Deletion/duplication testing only), GREM1 (promoter region deletion/duplication testing only), KIT, MEN1, MLH1, MSH2, MSH3, MSH6, MUTYH, NBN, NF1, NHTL1, PALB2, PDGFRA, PMS2, POLD1, POLE, PTEN, RAD50, RAD51C, RAD51D, RNF43, SDHB, SDHC, SDHD, SMAD4, SMARCA4. STK11, TP53, TSC1, TSC2, and VHL.  The following genes were evaluated for sequence changes only: SDHA and HOXB13 c.251G>A variant only. The report date is Jul 17, 2018.    10/03/2018 Imaging   CT abdomen and pelvis 1.  No acute intra-abdominal process. 2. Grossly unchanged left pelvic sidewall mass with osseous involvement of the medial acetabulum. Progressive mild displacement of the associated comminuted pathologic fracture involving the right acetabulum and puboacetabular junction.  3. New venous stents extending from the left common iliac vein origin to the proximal left common femoral vein. The stents are patent.   11/06/2018 -  Chemotherapy   The patient had pembrolizumab for chemotherapy treatment.     01/28/2019 Imaging   1. No substantial interval change in exam. 2. Interval development of mild fullness in the left intrarenal collecting system and ureter without overt hydronephrosis at this time. 3. Abnormal soft tissue along the left pelvic sidewall has decreased slightly in the interval. 4. Similar  appearance of ill-defined fascial planes in the pelvis with some peritoneal thickening along the right pelvic sidewall and potentially involving the sigmoid mesocolon. 5. No substantial ascites.   05/03/2019 Imaging   1. Stable mild left pelvic sidewall soft tissue density. No new or progressive disease identified within the abdomen or pelvis.  2. Colonic diverticulosis. No radiographic evidence of diverticulitis.   Aortic Atherosclerosis (ICD10-I70.0).   09/09/2019 Imaging   1. No change in appearance of soft tissue thickening along the LEFT pelvic sidewall adjacent to chronic LEFT acetabular fracture. 2. Mild asymmetry of the bladder wall favoring the LEFT bladder wall, not well assessed. Similar accounting for variable degrees of distension on prior studies potentially related to prior radiation, attention on follow-up. 3. Signs of venous stenting in the LEFT hemipelvis with LEFT lower extremity muscular atrophy and mild stranding with similar appearance. Signs of colonic diverticulosis and diverticular disease without change.   02/24/2020 Imaging   1. Unchanged appearance of the pelvis as detailed below. 2. Unchanged soft tissue thickening of the left pelvic sidewall. 3. 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 4. No evidence discrete mass or lymphadenopathy nor metastatic disease in the abdomen or pelvis. 5. Status post hysterectomy and cholecystectomy. 6. Left common iliac vein stent. 7.   Pancolonic diverticulosis.     09/07/2020 Imaging   Stable abnormal soft tissue density in the left pelvic sidewall. No new or progressive disease within the abdomen or pelvis.   Colonic diverticulosis. No radiographic evidence of diverticulitis.   Stable severe destructive left hip arthropathy with fracture involving the medial acetabular wall.     Metastasis to lymph nodes (HCC)  06/23/2018 Initial  Diagnosis   Metastasis to lymph nodes (HCC)   07/10/2018 - 08/24/2018 Chemotherapy   The patient had palonosetron (ALOXI) injection 0.25 mg, 0.25 mg, Intravenous,  Once, 3 of 6 cycles Administration: 0.25 mg (07/10/2018), 0.25 mg (07/31/2018), 0.25 mg (08/24/2018) CARBOplatin (PARAPLATIN) 480 mg in sodium chloride 0.9 % 250 mL chemo infusion, 480 mg (100 % of original dose 482.5 mg), Intravenous,  Once, 3 of 6 cycles Dose modification: 482.5 mg (original dose 482.5 mg, Cycle 1) Administration: 480 mg (07/10/2018), 480 mg (07/31/2018), 480 mg (08/24/2018) PACLitaxel (TAXOL) 276 mg in sodium chloride 0.9 % 250 mL chemo infusion (> 80mg/m2), 140 mg/m2 = 276 mg (80 % of original dose 175 mg/m2), Intravenous,  Once, 3 of 6 cycles Dose modification: 140 mg/m2 (80 % of original dose 175 mg/m2, Cycle 1, Reason: Dose Not Tolerated) Administration: 276 mg (07/10/2018), 276 mg (07/31/2018), 276 mg (08/24/2018) fosaprepitant (EMEND) 150 mg, dexamethasone (DECADRON) 12 mg in sodium chloride 0.9 % 145 mL IVPB, , Intravenous,  Once, 3 of 6 cycles Administration:  (07/10/2018),  (07/31/2018),  (08/24/2018)   for chemotherapy treatment.     11/06/2018 -  Chemotherapy   The patient had pembrolizumab for chemotherapy treatment.     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 (HCC)  06/24/2018 Initial Diagnosis   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 (HCC)   07/10/2018 - 08/24/2018 Chemotherapy   The patient had palonosetron (ALOXI) injection 0.25 mg, 0.25 mg, Intravenous,  Once, 3 of 6 cycles Administration: 0.25 mg (07/10/2018), 0.25 mg (07/31/2018), 0.25 mg (08/24/2018) CARBOplatin (PARAPLATIN) 480 mg in sodium chloride 0.9 % 250 mL chemo infusion, 480 mg (100 % of original dose 482.5 mg), Intravenous,  Once, 3 of 6 cycles Dose modification: 482.5 mg (original dose 482.5 mg, Cycle 1) Administration: 480 mg (07/10/2018), 480 mg (07/31/2018), 480 mg (08/24/2018) PACLitaxel (TAXOL) 276 mg in sodium chloride 0.9 % 250 mL chemo infusion (> 80mg/m2), 140 mg/m2 = 276 mg (80 % of  original dose 175 mg/m2), Intravenous,  Once, 3 of 6 cycles Dose modification: 140 mg/m2 (80 % of original dose 175 mg/m2, Cycle 1, Reason: Dose Not Tolerated) Administration: 276 mg (07/10/2018), 276 mg (07/31/2018), 276 mg (08/24/2018) fosaprepitant (EMEND) 150 mg, dexamethasone (DECADRON) 12 mg in sodium chloride 0.9 % 145 mL IVPB, , Intravenous,  Once, 3 of 6 cycles Administration:  (07/10/2018),  (07/31/2018),  (08/24/2018)   for chemotherapy treatment.     11/06/2018 -  Chemotherapy   The patient had pembrolizumab for chemotherapy treatment.     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       PHYSICAL EXAMINATION: ECOG PERFORMANCE STATUS: 1 - Symptomatic but completely ambulatory  Vitals:   11/30/20 1130  BP: 134/63  Pulse: 84  Resp: 18  Temp: (!) 97.4 F (36.3 C)  SpO2: 100%   Filed Weights   11/30/20 1130  Weight: 226 lb (102.5 kg)    GENERAL:alert, no distress and comfortable SKIN: skin color,   texture, turgor are normal, no rashes or significant lesions EYES: normal, Conjunctiva are pink and non-injected, sclera clear OROPHARYNX:no exudate, no erythema and lips, buccal mucosa, and tongue normal  NECK: supple, thyroid normal size, non-tender, without nodularity LYMPH:  no palpable lymphadenopathy in the cervical, axillary or inguinal LUNGS: clear to auscultation and percussion with normal breathing effort HEART: regular rate & rhythm and no murmurs and no lower extremity edema ABDOMEN:abdomen soft, non-tender and normal bowel sounds Musculoskeletal:no cyanosis of digits and no clubbing  NEURO: alert & oriented x 3 with fluent speech, no focal motor/sensory deficits  LABORATORY DATA:  I have reviewed the data as listed     Component Value Date/Time   NA 137 11/30/2020 1115   K 3.8 11/30/2020 1115   CL 104 11/30/2020 1115   CO2 27 11/30/2020 1115   GLUCOSE 98 11/30/2020 1115   BUN 14 11/30/2020 1115   CREATININE 0.57 11/30/2020 1115   CALCIUM 9.1 11/30/2020 1115   PROT 7.5 11/30/2020 1115   ALBUMIN 4.0 11/30/2020 1115   AST 13 (L) 11/30/2020 1115   ALT 9 11/30/2020 1115   ALKPHOS 123 11/30/2020 1115   BILITOT 0.8 11/30/2020 1115   GFRNONAA >60 11/30/2020 1115   GFRAA >60 11/12/2019 1222    No results found for: SPEP, UPEP  Lab Results  Component Value Date   WBC 3.6 (L) 11/30/2020   NEUTROABS 2.3 11/30/2020   HGB 11.5 (L) 11/30/2020   HCT 34.6 (L) 11/30/2020   MCV 88.9 11/30/2020   PLT 222 11/30/2020      Chemistry      Component Value Date/Time   NA 137 11/30/2020 1115   K 3.8 11/30/2020 1115   CL 104 11/30/2020 1115   CO2 27 11/30/2020 1115   BUN 14 11/30/2020 1115   CREATININE 0.57 11/30/2020 1115      Component Value Date/Time   CALCIUM 9.1 11/30/2020 1115   ALKPHOS 123 11/30/2020 1115   AST 13 (L) 11/30/2020 1115   ALT 9 11/30/2020 1115   BILITOT 0.8 11/30/2020 1115     

## 2020-11-30 NOTE — Assessment & Plan Note (Signed)
She is taking both anticoagulation therapy and antiplatelet agent She has no recent bleeding She will continue her medications as directed  

## 2020-11-30 NOTE — Telephone Encounter (Signed)
Faxed today's lab results to Dr. Jeanie Cooks office at [phone]. Received confrrmation.

## 2020-11-30 NOTE — Assessment & Plan Note (Signed)
This is due to her treatment She is not symptomatic Observe 

## 2020-11-30 NOTE — Assessment & Plan Note (Signed)
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

## 2020-11-30 NOTE — Assessment & Plan Note (Signed)
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

## 2020-11-30 NOTE — Progress Notes (Signed)
OK to proceed with today's tx without CMP results per Dr. Alvy Bimler.

## 2020-12-22 ENCOUNTER — Inpatient Hospital Stay: Payer: Medicare Other

## 2020-12-22 ENCOUNTER — Other Ambulatory Visit: Payer: Self-pay

## 2020-12-22 ENCOUNTER — Inpatient Hospital Stay: Payer: Medicare Other | Attending: Hematology and Oncology

## 2020-12-22 ENCOUNTER — Inpatient Hospital Stay (HOSPITAL_BASED_OUTPATIENT_CLINIC_OR_DEPARTMENT_OTHER): Payer: Medicare Other | Admitting: Hematology and Oncology

## 2020-12-22 ENCOUNTER — Encounter: Payer: Self-pay | Admitting: Hematology and Oncology

## 2020-12-22 DIAGNOSIS — I824Y2 Acute embolism and thrombosis of unspecified deep veins of left proximal lower extremity: Secondary | ICD-10-CM

## 2020-12-22 DIAGNOSIS — C541 Malignant neoplasm of endometrium: Secondary | ICD-10-CM | POA: Diagnosis present

## 2020-12-22 DIAGNOSIS — C7951 Secondary malignant neoplasm of bone: Secondary | ICD-10-CM | POA: Diagnosis not present

## 2020-12-22 DIAGNOSIS — Z86718 Personal history of other venous thrombosis and embolism: Secondary | ICD-10-CM | POA: Diagnosis not present

## 2020-12-22 DIAGNOSIS — Z5112 Encounter for antineoplastic immunotherapy: Secondary | ICD-10-CM | POA: Insufficient documentation

## 2020-12-22 DIAGNOSIS — D61818 Other pancytopenia: Secondary | ICD-10-CM | POA: Diagnosis not present

## 2020-12-22 DIAGNOSIS — C55 Malignant neoplasm of uterus, part unspecified: Secondary | ICD-10-CM

## 2020-12-22 DIAGNOSIS — C774 Secondary and unspecified malignant neoplasm of inguinal and lower limb lymph nodes: Secondary | ICD-10-CM

## 2020-12-22 DIAGNOSIS — Z7901 Long term (current) use of anticoagulants: Secondary | ICD-10-CM | POA: Diagnosis not present

## 2020-12-22 DIAGNOSIS — E039 Hypothyroidism, unspecified: Secondary | ICD-10-CM

## 2020-12-22 DIAGNOSIS — C801 Malignant (primary) neoplasm, unspecified: Secondary | ICD-10-CM

## 2020-12-22 DIAGNOSIS — K5730 Diverticulosis of large intestine without perforation or abscess without bleeding: Secondary | ICD-10-CM | POA: Diagnosis not present

## 2020-12-22 DIAGNOSIS — G893 Neoplasm related pain (acute) (chronic): Secondary | ICD-10-CM | POA: Diagnosis not present

## 2020-12-22 DIAGNOSIS — Z79899 Other long term (current) drug therapy: Secondary | ICD-10-CM | POA: Insufficient documentation

## 2020-12-22 DIAGNOSIS — Z9221 Personal history of antineoplastic chemotherapy: Secondary | ICD-10-CM | POA: Insufficient documentation

## 2020-12-22 DIAGNOSIS — Z7189 Other specified counseling: Secondary | ICD-10-CM

## 2020-12-22 LAB — CBC WITH DIFFERENTIAL/PLATELET
Abs Immature Granulocytes: 0.01 10*3/uL (ref 0.00–0.07)
Basophils Absolute: 0 10*3/uL (ref 0.0–0.1)
Basophils Relative: 0 %
Eosinophils Absolute: 0.2 10*3/uL (ref 0.0–0.5)
Eosinophils Relative: 5 %
HCT: 35.1 % — ABNORMAL LOW (ref 36.0–46.0)
Hemoglobin: 11.3 g/dL — ABNORMAL LOW (ref 12.0–15.0)
Immature Granulocytes: 0 %
Lymphocytes Relative: 21 %
Lymphs Abs: 0.7 10*3/uL (ref 0.7–4.0)
MCH: 28.8 pg (ref 26.0–34.0)
MCHC: 32.2 g/dL (ref 30.0–36.0)
MCV: 89.5 fL (ref 80.0–100.0)
Monocytes Absolute: 0.3 10*3/uL (ref 0.1–1.0)
Monocytes Relative: 9 %
Neutro Abs: 2.3 10*3/uL (ref 1.7–7.7)
Neutrophils Relative %: 65 %
Platelets: 211 10*3/uL (ref 150–400)
RBC: 3.92 MIL/uL (ref 3.87–5.11)
RDW: 13.6 % (ref 11.5–15.5)
WBC: 3.6 10*3/uL — ABNORMAL LOW (ref 4.0–10.5)
nRBC: 0 % (ref 0.0–0.2)

## 2020-12-22 LAB — COMPREHENSIVE METABOLIC PANEL
ALT: 9 U/L (ref 0–44)
AST: 12 U/L — ABNORMAL LOW (ref 15–41)
Albumin: 3.7 g/dL (ref 3.5–5.0)
Alkaline Phosphatase: 125 U/L (ref 38–126)
Anion gap: 9 (ref 5–15)
BUN: 14 mg/dL (ref 8–23)
CO2: 23 mmol/L (ref 22–32)
Calcium: 9 mg/dL (ref 8.9–10.3)
Chloride: 110 mmol/L (ref 98–111)
Creatinine, Ser: 0.65 mg/dL (ref 0.44–1.00)
GFR, Estimated: >60 mL/min (ref >60)
Glucose, Bld: 94 mg/dL (ref 70–99)
Potassium: 3.6 mmol/L (ref 3.5–5.1)
Sodium: 142 mmol/L (ref 135–145)
Total Bilirubin: 0.6 mg/dL (ref 0.3–1.2)
Total Protein: 7.1 g/dL (ref 6.5–8.1)

## 2020-12-22 LAB — TSH: TSH: 1.409 u[IU]/mL (ref 0.308–3.960)

## 2020-12-22 MED ORDER — SODIUM CHLORIDE 0.9 % IV SOLN: Freq: Once | INTRAVENOUS | Status: AC

## 2020-12-22 MED ORDER — SODIUM CHLORIDE 0.9% FLUSH
10.0000 mL | Freq: Once | INTRAVENOUS | Status: AC
  Administered 2020-12-22: 10 mL

## 2020-12-22 MED ORDER — HEPARIN SOD (PORK) LOCK FLUSH 100 UNIT/ML IV SOLN
500.0000 [IU] | Freq: Once | INTRAVENOUS | Status: AC | PRN
  Administered 2020-12-22: 500 [IU]

## 2020-12-22 MED ORDER — SODIUM CHLORIDE 0.9% FLUSH
10.0000 mL | INTRAVENOUS | Status: DC | PRN
  Administered 2020-12-22: 10 mL

## 2020-12-22 MED ORDER — SODIUM CHLORIDE 0.9 % IV SOLN
200.0000 mg | Freq: Once | INTRAVENOUS | Status: AC
  Administered 2020-12-22: 200 mg via INTRAVENOUS
  Filled 2020-12-22: qty 8

## 2020-12-22 NOTE — Patient Instructions (Signed)
[facility] MEDICAL ONCOLOG[facility]   ?Discharge Instructions: ?Thank you for choosing [facility] to provide your oncology and hematology care.  ? ?If you have a lab appointment with the Cancer Center, please go directly to the Cancer Center and check in at the registration area. ?  ?Wear comfortable clothing and clothing appropriate for easy access to any Portacath or PICC line.  ? ?We strive to give you quality time with your provider. You may need to reschedule your appointment if you arrive late (15 or more minutes).  Arriving late affects you and other patients whose appointments are after yours.  Also, if you miss three or more appointments without notifying the office, you may be dismissed from the clinic at the provider?s discretion.    ?  ?For prescription refill requests, have your pharmacy contact our office and allow 72 hours for refills to be completed.   ? ?Today you received the following chemotherapy and/or immunotherapy agents: pembrolizumab    ?  ?To help prevent nausea and vomiting after your treatment, we encourage you to take your nausea medication as directed. ? ?BELOW ARE SYMPTOMS THAT SHOULD BE REPORTED IMMEDIATELY: ?*FEVER GREATER THAN 100.4 F (38 ?C) OR HIGHER ?*CHILLS OR SWEATING ?*NAUSEA AND VOMITING THAT IS NOT CONTROLLED WITH YOUR NAUSEA MEDICATION ?*UNUSUAL SHORTNESS OF BREATH ?*UNUSUAL BRUISING OR BLEEDING ?*URINARY PROBLEMS (pain or burning when urinating, or frequent urination) ?*BOWEL PROBLEMS (unusual diarrhea, constipation, pain near the anus) ?TENDERNESS IN MOUTH AND THROAT WITH OR WITHOUT PRESENCE OF ULCERS (sore throat, sores in mouth, or a toothache) ?UNUSUAL RASH, SWELLING OR PAIN  ?UNUSUAL VAGINAL DISCHARGE OR ITCHING  ? ?Items with * indicate a potential emergency and should be followed up as soon as possible or go to the Emergency Department if any problems should occur. ? ?Please show the CHEMOTHERAPY ALERT CARD or IMMUNOTHERAPY ALERT CARD at  check-in to the Emergency Department and triage nurse. ? ?Should you have questions after your visit or need to cancel or reschedule your appointment, please contact [facility]  Dept: [phone]  and follow the prompts.  Office hours are 8:00 a.m. to 4:30 p.m. Monday - Friday. Please note that voicemails left after 4:00 p.m. may not be returned until the following business day.  We are closed weekends and major holidays. You have access to a nurse at all times for urgent questions. Please call the main number to the clinic Dept: [phone] and follow the prompts. ? ? ?For any non-urgent questions, you may also contact your provider using MyChart. We now offer e-Visits for anyone 18 and older to request care online for non-urgent symptoms. For details visit [email or web address]. ?  ?Also download the MyChart app! Go to the app store, search "MyChart", open the app, select [facility], and log in with your MyChart username and password. ? ?Due to Covid, a mask is required upon entering the hospital/clinic. If you do not have a mask, one will be given to you upon arrival. For doctor visits, patients may have 1 support person aged 18 or older with them. For treatment visits, patients cannot have anyone with them due to current Covid guidelines and our immunocompromised population.  ? ?

## 2020-12-22 NOTE — Assessment & Plan Note (Signed)
She is taking both anticoagulation therapy and antiplatelet agent She has no recent bleeding She will continue her medications as directed  

## 2020-12-22 NOTE — Assessment & Plan Note (Signed)
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

## 2020-12-22 NOTE — Assessment & Plan Note (Signed)
This is due to her treatment She is not symptomatic Observe 

## 2020-12-22 NOTE — Assessment & Plan Note (Signed)
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

## 2020-12-22 NOTE — Progress Notes (Signed)
[place] OFFICE PROGRESS NOTE  Patient Care Team: Nolene Ebbs, MD as PCP - General (Internal Medicine)  ASSESSMENT & PLAN:  Uterine cancer [facility]) Her last CT showed stable disease control Per previous discussion, the plan would be to continue pembrolizumab indefinitely She has no side effects from treatment so far I recommend CT imaging every 6 months, next will be due early January  Left leg DVT ([place]) She is taking both anticoagulation therapy and antiplatelet agent She has no recent bleeding She will continue her medications as directed  Pancytopenia, acquired ([place]) This is due to her treatment She is not symptomatic Observe  Cancer associated pain She has less pain Part of her pain is due to 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   All questions were answered. The patient knows to call the clinic with any problems, questions or concerns. The total time spent in the appointment was 20 minutes encounter with patients including review of chart and various tests results, discussions about plan of care and coordination of care plan   [name], MD 12/22/2020 10:47 AM  INTERVAL HISTORY: Please see below for problem oriented charting. she returns for treatment follow-up on pembrolizumab for MSI high recurrent uterine cancer She is doing well from a treatment perspective Her chronic pain is stable Denies recent infection No recent bleeding complications from her anticoagulation therapy  REVIEW OF SYSTEMS:   Constitutional: Denies fevers, chills or abnormal weight loss Eyes: Denies blurriness of vision Ears, nose, mouth, throat, and face: Denies mucositis or sore throat Respiratory: Denies cough, dyspnea or wheezes Cardiovascular: Denies palpitation, chest discomfort  Gastrointestinal:  Denies nausea, heartburn or change  in bowel habits Skin: Denies abnormal skin rashes Lymphatics: Denies new lymphadenopathy or easy bruising Neurological:Denies numbness, tingling or new weaknesses Behavioral/Psych: Mood is stable, no new changes  All other systems were reviewed with the patient and are negative.  I have reviewed the past medical history, past surgical history, social history and family history with the patient and they are unchanged from previous note.  ALLERGIES:  has No Known Allergies.  MEDICATIONS:  Current Outpatient Medications  Medication Sig Dispense Refill   apixaban (ELIQUIS) 2.5 MG TABS tablet Take by mouth 2 (two) times daily.     diclofenac sodium (VOLTAREN) 1 % GEL APPLY 4GRAMS 4 TIMES A DAY AS NEEDED FOR PAINS     gabapentin (NEURONTIN) 300 MG capsule Take 300 mg by mouth 2 (two) times daily.     lidocaine-prilocaine (EMLA) cream Apply 1 application topically daily as needed. 30 g 3   methadone (DOLOPHINE) 10 MG tablet Take 1 tablet (10 mg total) by mouth every 12 (twelve) hours. 60 tablet 0   morphine (MSIR) 15 MG tablet Take 1 tablet (15 mg total) by mouth every 6 (six) hours as needed for severe pain. 60 tablet 0   Olopatadine HCl 0.2 % SOLN Place 1 drop into both eyes daily.     No current facility-administered medications for this visit.   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 Uterus +/- tubes/ovaries, neoplastic, with left fallopian tube and ovary - INVASIVE ENDOMETRIOID CARCINOMA (1.5 CM), FIGO GRADE II, ARISING IN A BACKGROUND OF ATYPICAL COMPLEX HYPERPLASIA, CONFINED WITHIN INNER HALF OF THE MYOMETRIUM. - ENDOMETRIAL POLYP WITH ASSOCIATED ATYPICAL COMPLEX HYPERPLASIA. - MYOMETRIUM: LEIOMYOMATA. - CERVIX: BENIGN SQUAMOUS MUCOSA AND ENDOCERVICAL MUCOSA, NO DYSPLASIA OR MALIGNANCY. - LEFT OVARY: BENIGN OVARIAN TISSUE WITH ENDOSALPINGOSIS, NO EVIDENCE OF ATYPIA OR MALIGNANCY. - LEFT FALLOPIAN TUBE: NO HISTOLOGIC ABNORMALITIES. - PLEASE SEE ONCOLOGY TEMPLATE FOR DETAIL. 2. Ovary and fallopian tube, right - BENIGN OVARIAN TISSUE WITH ENDOSALPINGOSIS, NO ATYPIA OR MALIGNANCY. - BENIGN FALLOPIAN TUBAL TISSUE, NO PATHOLOGIC ABNORMALITIES. Microscopic Comment 1. UTERUS Specimen: Uterus, cervix, bilateral ovaries and fallopian tubes Procedure: Total hysterectomy and bilateral salpingo-oophorectomy Lymph node sampling performed: No Specimen integrity: Intact Maximum tumor size (cm): 1.5 cm, glass slide measurement Histologic type: Invasive endometrioid carcinoma Grade: FIGO grade II Myometrial invasion: 1 cm where myometrium is 2.3 cm in thickness Cervical stromal involvement: No Extent of involvement of other organs: No Lymph vascular invasion: Not identified Peritoneal washings: Negative ([id number]) Lymph nodes: number examined N/A; number positive N/A TNM code: pT1a, pNX 1 oFf 3IGO Stage (based on pathologic findings, needs clinical correlation): IA  Comments: Sections the endomyometrium away from the grossly identified endometrial polyp show an invasive FIGO grade II endometrioid carcinoma. The tumor is confined within inner half of the myometrium. No angiolymphatic invasion is identified. No cervical stromal involvement is identified. Sections of the grossly identified endometrial polyp show an endometrial polyp with  associated atypical compacted hyperplasia with no definitive evidence of carcinoma.   12/07/2017 Imaging   US venous Doppler Right: No evidence of common femoral vein obstruction. Left: Findings consistent with acute deep vein thrombosis involving the left femoral vein, left proximal profunda vein, and left popliteal vein. Unable to adequately interrogate the common femoral and higher, or the calf secondary to significant edema and body habitus   12/07/2017 [facility] Admission   She presented to the ER and was diagnosed with acute DVT   01/18/2018 - 01/21/2018 Hospital Admission   She was admitted to the hospital for management of severe persistent DVT   01/18/2018 Imaging   US venous Doppler Right: No evidence of common femoral vein obstruction. Left: Findings consistent with acute deep vein thrombosis involving the left common femoral vein, and left popliteal vein.   01/19/2018 Surgery   Pre-operative Diagnosis: Subacute DVT with severe post thrombotic syndrome Post-operative diagnosis:  Same Surgeon:  Erlene Quan C. Donzetta Matters, MD Procedure Performed: 1.  Ultrasound-guided cannulation left small saphenous vein 2.  Left lower extremity and central venography 3.  Intravascular ultrasound of left popliteal, femoral, common femoral, external and common iliac veins and IVC 4.  Stent of left common and external iliac veins with 14 x 60 mm Vici 5.  Moderate sedation with fentanyl and Versed for 50 minutes   Indications: 75 year old female with a history of DVT in October now presents with persistent left lower extremity swelling and ultrasound demonstrating likely persistent DVT.  She has been on Xarelto at this time.  She is now indicated for  venogram possible intervention.   Findings: Flow in the left lower extremity was stagnant throughout but by venogram all veins were patent.  There was a focal occlusive area approximately 2 cm in length at the common and external iliac vein junction at the  hypogastric on the left.  After stenting and ballooning we had a diameter of 12 millimeters in the stent and venogram demonstrated flow in the lower extremity veins were previously was stagnant and no further residual stenosis in the left common and external iliac vein junction.   04/12/2018 Imaging   US Venous Doppler Right: No evidence of common femoral vein obstruction. Left: There is no evidence of deep vein thrombosis in the lower extremity. However, portions of this examination were limited- see technologist comments above. Left groin: Large hypoechoic area with mixed echoes noted measuring nearly 10 cm. Possible  hematoma versus unknown etiology. Ultrasound characteristics of enlarged lymph nodes noted in the groin.      05/15/2018 Imaging   US Venous Doppler Right: No evidence of deep vein thrombosis in the lower extremity. No indirect evidence of obstruction proximal to the inguinal ligament. Left: No reflux was noted in the common femoral vein , femoral vein in the thigh, popliteal vein, great saphenous vein at the saphenofemoral junction, great saphenous vein at the proximal thigh, great saphenous vein at the mid thigh, great saphenous vein  at the distal thigh, great saphenous vein at the knee, origin of the small saphenous vein, proximal small saphenous vein, and mid small saphenous vein. There is no evidence of deep vein thrombosis in the lower extremity. There is no evidence of superficial venous thrombosis. No cystic structure found in the popliteal fossa. Unable to evaluate extension of common femoral vein obstruction proximal to the inguinal ligament.   06/01/2018 Imaging   1. 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 Additionally, there is lucency involving the medial sidewall of the left acetabulum with potential nondisplaced pathologic fracture. Further evaluation with  contrast-enhanced pelvic MRI could be performed as clinically indicated. 2. The left pelvic arterial and venous system is encased by this infiltrative left pelvic sidewall mass however while difficult to ascertain, the left external iliac venous stent appears patent.   06/18/2018 Pathology Results   Lymph node for lymphoma, Left Inguinal - METASTATIC ADENOCARCINOMA, SEE COMMENT. Microscopic Comment Immunohistochemistry is positive for cytokeratin 7, PAX8, ER, and PR. Cytokeratin 5/6,and p63 are negative. The immunoprofile along with the patient's history are consistent with a gynecologic primary.   06/18/2018 Surgery   Pre-op Diagnosis: INGUINAL LYMPHADENOPATHY, PELVIC MASS      Procedure(s): EXCISIONAL BIOPSY DEEP LEFT INGUINAL LYMPH NODE   Surgeon(s): Coralie Keens, MD      06/24/2018 Cancer Staging   Staging form: Corpus Uteri - Carcinoma and Carcinosarcoma, AJCC 8th Edition - Clinical: Stage IVB (cT1a, cN2, pM1) - Signed by [name], MD on 06/24/2018     Genetic Testing   Patient has genetic testing done for MMR on pathology from 06/18/2018. Results revealed patient has the following mutation(s): MMR: abnormal   06/29/2018 Procedure   Placement of a subcutaneous port device. Catheter tip at the SVC and right atrium junction.    Genetic Testing   Patient has genetic testing done for MSI on pathology from 06/18/2018. Results revealed patient has the following mutation(s): MSI: High   07/02/2018 PET scan   Previous hysterectomy, with asymmetric focus of hypermetabolic activity in the left vaginal  cuff, suspicious for residual or recurrent carcinoma.   Large hypermetabolic soft tissue mass involving the left pelvic sidewall and acetabulum, consistent with metastatic disease.   No evidence metastatic disease within the abdomen, chest, or neck.   07/09/2018 Tumor Marker   Patient's tumor was tested for the following markers: CA-125 Results of the tumor marker test revealed 9    07/10/2018 - 08/24/2018 Chemotherapy   The patient had carboplatin and taxol x 3 cycles   07/17/2018 Genetic Testing   Negative genetic testing on the common hereditary cancer panel.  The Common Hereditary Gene Panel offered by [facility] includes sequencing and/or deletion duplication testing of the following 48 genes: APC, ATM, AXIN2, BARD1, BMPR1A, BRCA1, BRCA2, BRIP1, CDH1, CDK4, CDKN2A (p14ARF), CDKN2A (p16INK4a), CHEK2, CTNNA1, DICER1, EPCAM (Deletion/duplication testing only), GREM1 (promoter region deletion/duplication testing only), KIT, MEN1, MLH1, MSH2, MSH3, MSH6, MUTYH, NBN, NF1, NHTL1, PALB2, PDGFRA, PMS2, POLD1, POLE, PTEN, RAD50, RAD51C, RAD51D, RNF43, SDHB, SDHC, SDHD, SMAD4, SMARCA4. STK11, TP53, TSC1, TSC2, and VHL.  The following genes were evaluated for sequence changes only: SDHA and HOXB13 c.251G>A variant only. The report date is Jul 17, 2018.    10/03/2018 Imaging   CT abdomen and pelvis 1.  No acute intra-abdominal process. 2. Grossly unchanged left pelvic sidewall mass with osseous involvement of the medial acetabulum. Progressive mild displacement of the associated comminuted pathologic fracture involving the right acetabulum and puboacetabular junction.  3. New venous stents extending from the left common iliac vein origin to the proximal left common femoral vein. The stents are patent.   11/06/2018 -  Chemotherapy   The patient had pembrolizumab for chemotherapy treatment.     01/28/2019 Imaging   1. No substantial interval change in exam. 2. Interval development of mild fullness in the left intrarenal collecting system and ureter without overt hydronephrosis at this time. 3. Abnormal soft tissue along the left pelvic sidewall has decreased slightly in the interval. 4. Similar appearance of ill-defined fascial planes in the pelvis with some peritoneal thickening along the right pelvic sidewall and potentially involving the sigmoid mesocolon. 5. No substantial ascites.    05/03/2019 Imaging   1. Stable mild left pelvic sidewall soft tissue density. No new or progressive disease identified within the abdomen or pelvis.  2. Colonic diverticulosis. No radiographic evidence of diverticulitis.   Aortic Atherosclerosis (ICD10-I70.0).   09/09/2019 Imaging   1. No change in appearance of soft tissue thickening along the LEFT pelvic sidewall adjacent to chronic LEFT acetabular fracture. 2. Mild asymmetry of the bladder wall favoring the LEFT bladder wall, not well assessed. Similar accounting for variable degrees of distension on prior studies potentially related to prior radiation, attention on follow-up. 3. Signs of venous stenting in the LEFT hemipelvis with LEFT lower extremity muscular atrophy and mild stranding with similar appearance. Signs of colonic diverticulosis and diverticular disease without change.   02/24/2020 Imaging   1. Unchanged appearance of the pelvis as detailed below. 2. Unchanged soft tissue thickening of the left pelvic sidewall. 3. 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 4. No evidence discrete mass or lymphadenopathy nor metastatic disease in the abdomen or pelvis. 5. Status post hysterectomy and cholecystectomy. 6. Left common iliac vein stent. 7. Pancolonic diverticulosis.     09/07/2020 Imaging   Stable abnormal soft tissue density in the left pelvic sidewall. No new or progressive disease within the abdomen or pelvis.  Colonic diverticulosis. No radiographic evidence of diverticulitis.   Stable severe destructive left hip arthropathy with fracture involving the medial acetabular wall.     Metastasis to lymph nodes ([place])  06/23/2018 Initial Diagnosis   Metastasis to lymph nodes ([place])   07/10/2018 - 08/24/2018 Chemotherapy   The patient had palonosetron (ALOXI) injection 0.25 mg, 0.25 mg, Intravenous,  Once, 3 of 6 cycles Administration:  0.25 mg (07/10/2018), 0.25 mg (07/31/2018), 0.25 mg (08/24/2018) CARBOplatin (PARAPLATIN) 480 mg in sodium chloride 0.9 % 250 mL chemo infusion, 480 mg (100 % of original dose 482.5 mg), Intravenous,  Once, 3 of 6 cycles Dose modification: 482.5 mg (original dose 482.5 mg, Cycle 1) Administration: 480 mg (07/10/2018), 480 mg (07/31/2018), 480 mg (08/24/2018) PACLitaxel (TAXOL) 276 mg in sodium chloride 0.9 % 250 mL chemo infusion (> 69m/m2), 140 mg/m2 = 276 mg (80 % of original dose 175 mg/m2), Intravenous,  Once, 3 of 6 cycles Dose modification: 140 mg/m2 (80 % of original dose 175 mg/m2, Cycle 1, Reason: Dose Not Tolerated) Administration: 276 mg (07/10/2018), 276 mg (07/31/2018), 276 mg (08/24/2018) fosaprepitant (EMEND) 150 mg, dexamethasone (DECADRON) 12 mg in sodium chloride 0.9 % 145 mL IVPB, , Intravenous,  Once, 3 of 6 cycles Administration:  (07/10/2018),  (07/31/2018),  (08/24/2018)   for chemotherapy treatment.     11/06/2018 -  Chemotherapy   The patient had pembrolizumab for chemotherapy treatment.     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 [place]  06/24/2018 Initial Diagnosis   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   07/10/2018 - 08/24/2018 Chemotherapy   The patient had palonosetron (ALOXI) injection 0.25 mg, 0.25 mg, Intravenous,  Once, 3 of 6 cycles Administration: 0.25 mg (07/10/2018), 0.25 mg (07/31/2018), 0.25 mg (08/24/2018) CARBOplatin (PARAPLATIN) 480 mg in sodium chloride 0.9 % 250 mL chemo infusion, 480 mg (100 % of original dose 482.5 mg), Intravenous,  Once, 3 of 6 cycles Dose modification: 482.5 mg (original dose 482.5 mg, Cycle 1) Administration: 480 mg (07/10/2018), 480 mg (07/31/2018), 480 mg (08/24/2018) PACLitaxel (TAXOL) 276 mg in sodium chloride 0.9 % 250 mL chemo infusion (> 831mm2), 140 mg/m2 = 276 mg (80 % of original dose 175 mg/m2), Intravenous,  Once, 3 of 6 cycles Dose modification: 140 mg/m2 (80 % of original dose 175 mg/m2, Cycle 1, Reason: Dose Not Tolerated) Administration: 276 mg (07/10/2018), 276 mg  (07/31/2018), 276 mg (08/24/2018) fosaprepitant (EMEND) 150 mg, dexamethasone (DECADRON) 12 mg in sodium chloride 0.9 % 145 mL IVPB, , Intravenous,  Once, 3 of 6 cycles Administration:  (07/10/2018),  (07/31/2018),  (08/24/2018)   for chemotherapy treatment.     11/06/2018 -  Chemotherapy   The patient had pembrolizumab for chemotherapy treatment.     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       PHYSICAL EXAMINATION: ECOG PERFORMANCE STATUS: 2 - Symptomatic, <50% confined to bed  Vitals:   12/22/20 0951  BP: (!) 139/59  Pulse: 83  Resp: 18  Temp: (!) 97.4 F (36.3 C)  SpO2: 100%   Filed Weights   12/22/20 0951  Weight: 227 lb 3.2 oz (103.1 kg)    GENERAL:alert, no distress and comfortable SKIN: skin color, texture, turgor are normal, no rashes or significant lesions EYES: normal, Conjunctiva are pink and non-injected, sclera clear OROPHARYNX:no exudate, no erythema and lips, buccal mucosa, and tongue  normal  NECK: supple, thyroid normal size, non-tender, without nodularity LYMPH:  no palpable lymphadenopathy in the cervical, axillary or inguinal LUNGS: clear to auscultation and percussion with normal breathing effort HEART: regular rate & rhythm and no murmurs with chronic bilateral lower extremity edema ABDOMEN:abdomen soft, non-tender and normal bowel sounds Musculoskeletal:no cyanosis of digits and no clubbing  NEURO: alert & oriented x 3 with fluent speech, no focal motor/sensory deficits  LABORATORY DATA:  I have reviewed the data as listed    Component Value Date/Time   NA 142 12/22/2020 0934   K 3.6 12/22/2020 0934   CL 110 12/22/2020 0934   CO2 23 12/22/2020 0934   GLUCOSE 94 12/22/2020 0934   BUN 14 12/22/2020  0934   CREATININE 0.65 12/22/2020 0934   CREATININE 0.57 11/30/2020 1115   CALCIUM 9.0 12/22/2020 0934   PROT 7.1 12/22/2020 0934   ALBUMIN 3.7 12/22/2020 0934   AST 12 (L) 12/22/2020 0934   AST 13 (L) 11/30/2020 1115   ALT 9 12/22/2020 0934   ALT 9 11/30/2020 1115   ALKPHOS 125 12/22/2020 0934   BILITOT 0.6 12/22/2020 0934   BILITOT 0.8 11/30/2020 1115   GFRNONAA >60 12/22/2020 0934   GFRNONAA >60 11/30/2020 1115   GFRAA >60 11/12/2019 1222    No results found for: SPEP, UPEP  Lab Results  Component Value Date   WBC 3.6 (L) 12/22/2020   NEUTROABS 2.3 12/22/2020   HGB 11.3 (L) 12/22/2020   HCT 35.1 (L) 12/22/2020   MCV 89.5 12/22/2020   PLT 211 12/22/2020      Chemistry      Component Value Date/Time   NA 142 12/22/2020 0934   K 3.6 12/22/2020 0934   CL 110 12/22/2020 0934   CO2 23 12/22/2020 0934   BUN 14 12/22/2020 0934   CREATININE 0.65 12/22/2020 0934   CREATININE 0.57 11/30/2020 1115      Component Value Date/Time   CALCIUM 9.0 12/22/2020 0934   ALKPHOS 125 12/22/2020 0934   AST 12 (L) 12/22/2020 0934   AST 13 (L) 11/30/2020 1115   ALT 9 12/22/2020 0934   ALT 9 11/30/2020 1115   BILITOT 0.6 12/22/2020 0934   BILITOT 0.8 11/30/2020 1115

## 2021-01-19 ENCOUNTER — Inpatient Hospital Stay: Payer: Medicare Other | Attending: Hematology and Oncology

## 2021-01-19 ENCOUNTER — Encounter: Payer: Self-pay | Admitting: Hematology and Oncology

## 2021-01-19 ENCOUNTER — Inpatient Hospital Stay (HOSPITAL_BASED_OUTPATIENT_CLINIC_OR_DEPARTMENT_OTHER): Payer: Medicare Other | Admitting: Hematology and Oncology

## 2021-01-19 ENCOUNTER — Inpatient Hospital Stay: Payer: Medicare Other

## 2021-01-19 ENCOUNTER — Other Ambulatory Visit: Payer: Self-pay

## 2021-01-19 VITALS — BP 112/56 | HR 86 | Temp 97.5°F | Resp 18 | Ht 61.0 in | Wt 226.0 lb

## 2021-01-19 DIAGNOSIS — Z79899 Other long term (current) drug therapy: Secondary | ICD-10-CM | POA: Diagnosis not present

## 2021-01-19 DIAGNOSIS — I700 Atherosclerosis of aorta: Secondary | ICD-10-CM | POA: Diagnosis not present

## 2021-01-19 DIAGNOSIS — C7951 Secondary malignant neoplasm of bone: Secondary | ICD-10-CM

## 2021-01-19 DIAGNOSIS — C801 Malignant (primary) neoplasm, unspecified: Secondary | ICD-10-CM

## 2021-01-19 DIAGNOSIS — C55 Malignant neoplasm of uterus, part unspecified: Secondary | ICD-10-CM | POA: Diagnosis not present

## 2021-01-19 DIAGNOSIS — G893 Neoplasm related pain (acute) (chronic): Secondary | ICD-10-CM | POA: Insufficient documentation

## 2021-01-19 DIAGNOSIS — C541 Malignant neoplasm of endometrium: Secondary | ICD-10-CM | POA: Diagnosis present

## 2021-01-19 DIAGNOSIS — C774 Secondary and unspecified malignant neoplasm of inguinal and lower limb lymph nodes: Secondary | ICD-10-CM | POA: Diagnosis not present

## 2021-01-19 DIAGNOSIS — K5730 Diverticulosis of large intestine without perforation or abscess without bleeding: Secondary | ICD-10-CM | POA: Insufficient documentation

## 2021-01-19 DIAGNOSIS — Z7901 Long term (current) use of anticoagulants: Secondary | ICD-10-CM | POA: Diagnosis not present

## 2021-01-19 DIAGNOSIS — Z86718 Personal history of other venous thrombosis and embolism: Secondary | ICD-10-CM | POA: Insufficient documentation

## 2021-01-19 DIAGNOSIS — Z5112 Encounter for antineoplastic immunotherapy: Secondary | ICD-10-CM | POA: Insufficient documentation

## 2021-01-19 DIAGNOSIS — E039 Hypothyroidism, unspecified: Secondary | ICD-10-CM

## 2021-01-19 DIAGNOSIS — I825Z2 Chronic embolism and thrombosis of unspecified deep veins of left distal lower extremity: Secondary | ICD-10-CM

## 2021-01-19 DIAGNOSIS — Z7189 Other specified counseling: Secondary | ICD-10-CM

## 2021-01-19 LAB — CBC WITH DIFFERENTIAL/PLATELET
Abs Immature Granulocytes: 0.01 10*3/uL (ref 0.00–0.07)
Basophils Absolute: 0 10*3/uL (ref 0.0–0.1)
Basophils Relative: 1 %
Eosinophils Absolute: 0.2 10*3/uL (ref 0.0–0.5)
Eosinophils Relative: 6 %
HCT: 35.7 % — ABNORMAL LOW (ref 36.0–46.0)
Hemoglobin: 11.4 g/dL — ABNORMAL LOW (ref 12.0–15.0)
Immature Granulocytes: 0 %
Lymphocytes Relative: 22 %
Lymphs Abs: 0.7 10*3/uL (ref 0.7–4.0)
MCH: 28.6 pg (ref 26.0–34.0)
MCHC: 31.9 g/dL (ref 30.0–36.0)
MCV: 89.5 fL (ref 80.0–100.0)
Monocytes Absolute: 0.4 10*3/uL (ref 0.1–1.0)
Monocytes Relative: 11 %
Neutro Abs: 2 10*3/uL (ref 1.7–7.7)
Neutrophils Relative %: 60 %
Platelets: 230 10*3/uL (ref 150–400)
RBC: 3.99 MIL/uL (ref 3.87–5.11)
RDW: 13.5 % (ref 11.5–15.5)
WBC: 3.3 10*3/uL — ABNORMAL LOW (ref 4.0–10.5)
nRBC: 0 % (ref 0.0–0.2)

## 2021-01-19 LAB — COMPREHENSIVE METABOLIC PANEL
ALT: 9 U/L (ref 0–44)
AST: 12 U/L — ABNORMAL LOW (ref 15–41)
Albumin: 3.8 g/dL (ref 3.5–5.0)
Alkaline Phosphatase: 139 U/L — ABNORMAL HIGH (ref 38–126)
Anion gap: 9 (ref 5–15)
BUN: 11 mg/dL (ref 8–23)
CO2: 24 mmol/L (ref 22–32)
Calcium: 9 mg/dL (ref 8.9–10.3)
Chloride: 106 mmol/L (ref 98–111)
Creatinine, Ser: 0.71 mg/dL (ref 0.44–1.00)
GFR, Estimated: >60 mL/min (ref >60)
Glucose, Bld: 97 mg/dL (ref 70–99)
Potassium: 3.9 mmol/L (ref 3.5–5.1)
Sodium: 139 mmol/L (ref 135–145)
Total Bilirubin: 0.5 mg/dL (ref 0.3–1.2)
Total Protein: 7.4 g/dL (ref 6.5–8.1)

## 2021-01-19 LAB — TSH: TSH: 2.148 u[IU]/mL (ref 0.308–3.960)

## 2021-01-19 MED ORDER — SODIUM CHLORIDE 0.9% FLUSH: 10.0000 mL | INTRAVENOUS | Status: DC | PRN

## 2021-01-19 MED ORDER — HEPARIN SOD (PORK) LOCK FLUSH 100 UNIT/ML IV SOLN: 500.0000 [IU] | Freq: Once | INTRAVENOUS | Status: DC | PRN

## 2021-01-19 MED ORDER — SODIUM CHLORIDE 0.9 % IV SOLN
200.0000 mg | Freq: Once | INTRAVENOUS | Status: AC
  Administered 2021-01-19: 200 mg via INTRAVENOUS
  Filled 2021-01-19: qty 8

## 2021-01-19 MED ORDER — SODIUM CHLORIDE 0.9 % IV SOLN: Freq: Once | INTRAVENOUS | Status: AC

## 2021-01-19 MED ORDER — SODIUM CHLORIDE 0.9% FLUSH
10.0000 mL | Freq: Once | INTRAVENOUS | Status: AC
  Administered 2021-01-19: 10 mL

## 2021-01-19 NOTE — Assessment & Plan Note (Signed)
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

## 2021-01-19 NOTE — Progress Notes (Signed)
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 She will continue prescribed pain medicine  Lower leg DVT (deep venous thromboembolism), chronic, left ([place]) She is taking both anticoagulation therapy and antiplatelet agent She has no recent bleeding She will continue her medications as directed  Orders Placed This Encounter  Procedures   CT ABDOMEN PELVIS W CONTRAST    Standing Status:   Future    Standing Expiration Date:   01/19/2022    Order Specific Question:   If indicated for the ordered procedure, I authorize the administration of contrast media per Radiology protocol    Answer:   Yes    Order Specific Question:   Preferred imaging location?    Answer:   [facility]    Order Specific Question:   Radiology Contrast Protocol - do NOT remove file path    Answer:   [email or web address]    All questions were answered. The patient knows to call the clinic with any problems, questions or concerns. The total time spent in the appointment was 25 minutes encounter with patients including review of chart and various tests results, discussions about plan of care and coordination of care plan   [name], MD 01/19/2021 12:24 PM  INTERVAL HISTORY: Please see below for problem oriented charting. she returns for treatment follow-up on treatment with pembrolizumab for recurrent uterine cancer She is doing very well Her pain is stable She denies  bleeding complications from anticoagulation therapy No signs or symptoms of abdominal pain or changes in bowel habits  REVIEW OF SYSTEMS:   Constitutional: Denies fevers, chills or abnormal weight loss Eyes: Denies blurriness of vision Ears, nose, mouth, throat, and face: Denies mucositis or sore throat Respiratory: Denies cough, dyspnea or wheezes Cardiovascular: Denies palpitation, chest discomfort  Gastrointestinal:  Denies nausea, heartburn or change in bowel habits Skin: Denies abnormal skin rashes Lymphatics: Denies new lymphadenopathy or easy bruising Neurological:Denies numbness, tingling or new weaknesses Behavioral/Psych: Mood is stable, no new changes  All other systems were reviewed with the patient and are negative.  I have reviewed the past medical history, past surgical history, social history and family history with the patient and they are unchanged from previous note.  ALLERGIES:  has No Known Allergies.  MEDICATIONS:  Current Outpatient Medications  Medication Sig Dispense Refill   apixaban (ELIQUIS) 2.5 MG TABS tablet Take by mouth 2 (two) times daily.     diclofenac sodium (VOLTAREN) 1 % GEL APPLY 4GRAMS 4 TIMES A DAY AS NEEDED FOR PAINS     gabapentin (NEURONTIN) 300 MG capsule Take 300 mg by mouth 2 (two) times daily.     lidocaine-prilocaine (EMLA) cream Apply 1 application topically daily as needed. 30 g 3   methadone (DOLOPHINE) 10 MG tablet Take 1 tablet (10 mg total) by mouth every 12 (twelve) hours. 60 tablet 0   morphine (MSIR) 15 MG tablet Take 1 tablet (15 mg total) by mouth every 6 (six) hours as needed for severe pain. 60 tablet 0  Olopatadine HCl 0.2 % SOLN Place 1 drop into both eyes daily.     No current facility-administered medications for this visit.    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 Uterus +/- tubes/ovaries, neoplastic, with left fallopian tube and ovary - INVASIVE ENDOMETRIOID CARCINOMA (1.5 CM), FIGO GRADE II, ARISING IN A BACKGROUND OF ATYPICAL COMPLEX HYPERPLASIA, CONFINED WITHIN INNER HALF OF THE MYOMETRIUM. - ENDOMETRIAL POLYP WITH ASSOCIATED ATYPICAL COMPLEX HYPERPLASIA. - MYOMETRIUM: LEIOMYOMATA. - CERVIX: BENIGN SQUAMOUS MUCOSA AND ENDOCERVICAL MUCOSA, NO DYSPLASIA OR MALIGNANCY. - LEFT OVARY: BENIGN OVARIAN TISSUE WITH ENDOSALPINGOSIS, NO EVIDENCE OF ATYPIA OR MALIGNANCY. - LEFT FALLOPIAN TUBE: NO HISTOLOGIC ABNORMALITIES. - PLEASE SEE ONCOLOGY TEMPLATE FOR DETAIL. 2. Ovary and fallopian tube, right - BENIGN OVARIAN TISSUE WITH ENDOSALPINGOSIS, NO ATYPIA OR MALIGNANCY. - BENIGN FALLOPIAN TUBAL TISSUE, NO PATHOLOGIC ABNORMALITIES. Microscopic Comment 1. UTERUS Specimen: Uterus, cervix, bilateral ovaries and fallopian tubes Procedure: Total hysterectomy and bilateral salpingo-oophorectomy Lymph node sampling performed: No Specimen integrity: Intact Maximum tumor size (cm): 1.5 cm, glass slide measurement Histologic type: Invasive endometrioid carcinoma Grade: FIGO grade II Myometrial invasion: 1 cm where myometrium is 2.3 cm in thickness Cervical stromal involvement: No Extent of involvement of other organs: No Lymph vascular invasion: Not identified Peritoneal washings: Negative ([id number]) Lymph nodes: number examined N/A; number positive N/A TNM code: pT1a, pNX 1 oFf 3IGO Stage (based on pathologic findings, needs clinical correlation): IA  Comments: Sections the endomyometrium away from the grossly identified endometrial polyp show an invasive FIGO grade II endometrioid carcinoma. The tumor is confined within inner half of the myometrium. No angiolymphatic invasion is identified. No cervical stromal involvement is identified. Sections of the grossly identified endometrial polyp show  an endometrial polyp with associated atypical compacted hyperplasia with no definitive evidence of carcinoma.   12/07/2017 Imaging   US venous Doppler Right: No evidence of common femoral vein obstruction. Left: Findings consistent with acute deep vein thrombosis involving the left femoral vein, left proximal profunda vein, and left popliteal vein. Unable to adequately interrogate the common femoral and higher, or the calf secondary to significant edema and body habitus   12/07/2017 [facility] Admission   She presented to the ER and was diagnosed with acute DVT   01/18/2018 - 01/21/2018 Hospital Admission   She was admitted to the hospital for management of severe persistent DVT   01/18/2018 Imaging   US venous Doppler Right: No evidence of common femoral vein obstruction. Left: Findings consistent with acute deep vein thrombosis involving the left common femoral vein, and left popliteal vein.   01/19/2018 Surgery   Pre-operative Diagnosis: Subacute DVT with severe post thrombotic syndrome Post-operative diagnosis:  Same Surgeon:  Erlene Quan C. Donzetta Matters, MD Procedure Performed: 1.  Ultrasound-guided cannulation left small saphenous vein 2.  Left lower extremity and central venography 3.  Intravascular ultrasound of left popliteal, femoral, common femoral, external and common iliac veins and IVC 4.  Stent of left common and external iliac veins with 14 x 60 mm Vici 5.  Moderate sedation with fentanyl and Versed for 50 minutes   Indications: 75 year old female with a history of DVT in October now presents with persistent left lower extremity swelling and ultrasound demonstrating likely persistent DVT.  She has been on Xarelto at this time.  She is now  indicated for venogram possible intervention.   Findings: Flow in the left lower extremity was stagnant throughout but by venogram all veins were patent.  There was a focal occlusive area approximately 2 cm in length at the common and external iliac  vein junction at the hypogastric on the left.  After stenting and ballooning we had a diameter of 12 millimeters in the stent and venogram demonstrated flow in the lower extremity veins were previously was stagnant and no further residual stenosis in the left common and external iliac vein junction.   04/12/2018 Imaging   US Venous Doppler Right: No evidence of common femoral vein obstruction. Left: There is no evidence of deep vein thrombosis in the lower extremity. However, portions of this examination were limited- see technologist comments above. Left groin: Large hypoechoic area with mixed echoes noted measuring nearly 10 cm. Possible  hematoma versus unknown etiology. Ultrasound characteristics of enlarged lymph nodes noted in the groin.      05/15/2018 Imaging   US Venous Doppler Right: No evidence of deep vein thrombosis in the lower extremity. No indirect evidence of obstruction proximal to the inguinal ligament. Left: No reflux was noted in the common femoral vein , femoral vein in the thigh, popliteal vein, great saphenous vein at the saphenofemoral junction, great saphenous vein at the proximal thigh, great saphenous vein at the mid thigh, great saphenous vein  at the distal thigh, great saphenous vein at the knee, origin of the small saphenous vein, proximal small saphenous vein, and mid small saphenous vein. There is no evidence of deep vein thrombosis in the lower extremity. There is no evidence of superficial venous thrombosis. No cystic structure found in the popliteal fossa. Unable to evaluate extension of common femoral vein obstruction proximal to the inguinal ligament.   06/01/2018 Imaging   1. 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 Additionally, there is lucency involving the medial sidewall of the left acetabulum with potential nondisplaced pathologic fracture. Further  evaluation with contrast-enhanced pelvic MRI could be performed as clinically indicated. 2. The left pelvic arterial and venous system is encased by this infiltrative left pelvic sidewall mass however while difficult to ascertain, the left external iliac venous stent appears patent.   06/18/2018 Pathology Results   Lymph node for lymphoma, Left Inguinal - METASTATIC ADENOCARCINOMA, SEE COMMENT. Microscopic Comment Immunohistochemistry is positive for cytokeratin 7, PAX8, ER, and PR. Cytokeratin 5/6,and p63 are negative. The immunoprofile along with the patient's history are consistent with a gynecologic primary.   06/18/2018 Surgery   Pre-op Diagnosis: INGUINAL LYMPHADENOPATHY, PELVIC MASS      Procedure(s): EXCISIONAL BIOPSY DEEP LEFT INGUINAL LYMPH NODE   Surgeon(s): Coralie Keens, MD      06/24/2018 Cancer Staging   Staging form: Corpus Uteri - Carcinoma and Carcinosarcoma, AJCC 8th Edition - Clinical: Stage IVB (cT1a, cN2, pM1) - Signed by [name], MD on 06/24/2018     Genetic Testing   Patient has genetic testing done for MMR on pathology from 06/18/2018. Results revealed patient has the following mutation(s): MMR: abnormal   06/29/2018 Procedure   Placement of a subcutaneous port device. Catheter tip at the SVC and right atrium junction.    Genetic Testing   Patient has genetic testing done for MSI on pathology from 06/18/2018. Results revealed patient has the following mutation(s): MSI: High   07/02/2018 PET scan   Previous hysterectomy, with asymmetric focus of hypermetabolic activity in the  left vaginal cuff, suspicious for residual or recurrent carcinoma.   Large hypermetabolic soft tissue mass involving the left pelvic sidewall and acetabulum, consistent with metastatic disease.   No evidence metastatic disease within the abdomen, chest, or neck.   07/09/2018 Tumor Marker   Patient's tumor was tested for the following markers: CA-125 Results of the tumor marker  test revealed 9   07/10/2018 - 08/24/2018 Chemotherapy   The patient had carboplatin and taxol x 3 cycles   07/17/2018 Genetic Testing   Negative genetic testing on the common hereditary cancer panel.  The Common Hereditary Gene Panel offered by [facility] includes sequencing and/or deletion duplication testing of the following 48 genes: APC, ATM, AXIN2, BARD1, BMPR1A, BRCA1, BRCA2, BRIP1, CDH1, CDK4, CDKN2A (p14ARF), CDKN2A (p16INK4a), CHEK2, CTNNA1, DICER1, EPCAM (Deletion/duplication testing only), GREM1 (promoter region deletion/duplication testing only), KIT, MEN1, MLH1, MSH2, MSH3, MSH6, MUTYH, NBN, NF1, NHTL1, PALB2, PDGFRA, PMS2, POLD1, POLE, PTEN, RAD50, RAD51C, RAD51D, RNF43, SDHB, SDHC, SDHD, SMAD4, SMARCA4. STK11, TP53, TSC1, TSC2, and VHL.  The following genes were evaluated for sequence changes only: SDHA and HOXB13 c.251G>A variant only. The report date is Jul 17, 2018.    10/03/2018 Imaging   CT abdomen and pelvis 1.  No acute intra-abdominal process. 2. Grossly unchanged left pelvic sidewall mass with osseous involvement of the medial acetabulum. Progressive mild displacement of the associated comminuted pathologic fracture involving the right acetabulum and puboacetabular junction.  3. New venous stents extending from the left common iliac vein origin to the proximal left common femoral vein. The stents are patent.   11/06/2018 -  Chemotherapy   The patient had pembrolizumab for chemotherapy treatment.     01/28/2019 Imaging   1. No substantial interval change in exam. 2. Interval development of mild fullness in the left intrarenal collecting system and ureter without overt hydronephrosis at this time. 3. Abnormal soft tissue along the left pelvic sidewall has decreased slightly in the interval. 4. Similar appearance of ill-defined fascial planes in the pelvis with some peritoneal thickening along the right pelvic sidewall and potentially involving the sigmoid mesocolon. 5. No  substantial ascites.   05/03/2019 Imaging   1. Stable mild left pelvic sidewall soft tissue density. No new or progressive disease identified within the abdomen or pelvis.  2. Colonic diverticulosis. No radiographic evidence of diverticulitis.   Aortic Atherosclerosis (ICD10-I70.0).   09/09/2019 Imaging   1. No change in appearance of soft tissue thickening along the LEFT pelvic sidewall adjacent to chronic LEFT acetabular fracture. 2. Mild asymmetry of the bladder wall favoring the LEFT bladder wall, not well assessed. Similar accounting for variable degrees of distension on prior studies potentially related to prior radiation, attention on follow-up. 3. Signs of venous stenting in the LEFT hemipelvis with LEFT lower extremity muscular atrophy and mild stranding with similar appearance. Signs of colonic diverticulosis and diverticular disease without change.   02/24/2020 Imaging   1. Unchanged appearance of the pelvis as detailed below. 2. Unchanged soft tissue thickening of the left pelvic sidewall. 3. 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 4. No evidence discrete mass or lymphadenopathy nor metastatic disease in the abdomen or pelvis. 5. Status post hysterectomy and cholecystectomy. 6. Left common iliac vein stent. 7. Pancolonic diverticulosis.     09/07/2020 Imaging   Stable abnormal soft tissue density in the left pelvic sidewall. No new or progressive disease within the abdomen or pelvis.  Colonic diverticulosis. No radiographic evidence of diverticulitis.   Stable severe destructive left hip arthropathy with fracture involving the medial acetabular wall.     Metastasis to lymph nodes ([place])  06/23/2018 Initial Diagnosis   Metastasis to lymph nodes ([place])   07/10/2018 - 08/24/2018 Chemotherapy   The patient had palonosetron (ALOXI) injection 0.25 mg, 0.25 mg, Intravenous,  Once, 3 of 6  cycles Administration: 0.25 mg (07/10/2018), 0.25 mg (07/31/2018), 0.25 mg (08/24/2018) CARBOplatin (PARAPLATIN) 480 mg in sodium chloride 0.9 % 250 mL chemo infusion, 480 mg (100 % of original dose 482.5 mg), Intravenous,  Once, 3 of 6 cycles Dose modification: 482.5 mg (original dose 482.5 mg, Cycle 1) Administration: 480 mg (07/10/2018), 480 mg (07/31/2018), 480 mg (08/24/2018) PACLitaxel (TAXOL) 276 mg in sodium chloride 0.9 % 250 mL chemo infusion (> 73m/m2), 140 mg/m2 = 276 mg (80 % of original dose 175 mg/m2), Intravenous,  Once, 3 of 6 cycles Dose modification: 140 mg/m2 (80 % of original dose 175 mg/m2, Cycle 1, Reason: Dose Not Tolerated) Administration: 276 mg (07/10/2018), 276 mg (07/31/2018), 276 mg (08/24/2018) fosaprepitant (EMEND) 150 mg, dexamethasone (DECADRON) 12 mg in sodium chloride 0.9 % 145 mL IVPB, , Intravenous,  Once, 3 of 6 cycles Administration:  (07/10/2018),  (07/31/2018),  (08/24/2018)   for chemotherapy treatment.     11/06/2018 -  Chemotherapy   The patient had pembrolizumab for chemotherapy treatment.     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 ([place]  06/24/2018 Initial Diagnosis   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 ([place]   07/10/2018 - 08/24/2018 Chemotherapy   The patient had palonosetron (ALOXI) injection 0.25 mg, 0.25 mg, Intravenous,  Once, 3 of 6 cycles Administration: 0.25 mg (07/10/2018), 0.25 mg (07/31/2018), 0.25 mg (08/24/2018) CARBOplatin (PARAPLATIN) 480 mg in sodium chloride 0.9 % 250 mL chemo infusion, 480 mg (100 % of original dose 482.5 mg), Intravenous,  Once, 3 of 6 cycles Dose modification: 482.5 mg (original dose 482.5 mg, Cycle 1) Administration: 480 mg (07/10/2018), 480 mg (07/31/2018), 480 mg (08/24/2018) PACLitaxel (TAXOL) 276 mg in sodium chloride 0.9 % 250 mL chemo infusion (> 8107mm2), 140 mg/m2 = 276 mg (80 % of original dose 175 mg/m2), Intravenous,  Once, 3 of 6 cycles Dose modification: 140 mg/m2 (80 % of original dose 175 mg/m2, Cycle 1, Reason: Dose Not Tolerated) Administration: 276  mg (07/10/2018), 276 mg (07/31/2018), 276 mg (08/24/2018) fosaprepitant (EMEND) 150 mg, dexamethasone (DECADRON) 12 mg in sodium chloride 0.9 % 145 mL IVPB, , Intravenous,  Once, 3 of 6 cycles Administration:  (07/10/2018),  (07/31/2018),  (08/24/2018)   for chemotherapy treatment.     11/06/2018 -  Chemotherapy   The patient had pembrolizumab for chemotherapy treatment.     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       PHYSICAL EXAMINATION: ECOG PERFORMANCE STATUS: 2 - Symptomatic, <50% confined to bed  Vitals:   01/19/21 1148  BP: (!) 112/56  Pulse: 86  Resp: 18  Temp: (!) 97.5 F (36.4 C)  SpO2: 100%   Filed Weights   01/19/21 1148  Weight: 226 lb (102.5 kg)    GENERAL:alert, no distress and comfortable NEURO: alert & oriented x 3 with fluent speech, no focal motor/sensory deficits  LABORATORY DATA:  I have reviewed the data as listed    Component Value Date/Time   NA  139 01/19/2021 1119   K 3.9 01/19/2021 1119   CL 106 01/19/2021 1119   CO2 24 01/19/2021 1119   GLUCOSE 97 01/19/2021 1119   BUN 11 01/19/2021 1119   CREATININE 0.71 01/19/2021 1119   CREATININE 0.57 11/30/2020 1115   CALCIUM 9.0 01/19/2021 1119   PROT 7.4 01/19/2021 1119   ALBUMIN 3.8 01/19/2021 1119   AST 12 (L) 01/19/2021 1119   AST 13 (L) 11/30/2020 1115   ALT 9 01/19/2021 1119   ALT 9 11/30/2020 1115   ALKPHOS 139 (H) 01/19/2021 1119   BILITOT 0.5 01/19/2021 1119   BILITOT 0.8 11/30/2020 1115   GFRNONAA >60 01/19/2021 1119   GFRNONAA >60 11/30/2020 1115   GFRAA >60 11/12/2019 1222    No results found for: SPEP, UPEP  Lab Results  Component Value Date   WBC 3.3 (L) 01/19/2021   NEUTROABS 2.0 01/19/2021   HGB 11.4 (L) 01/19/2021   HCT  35.7 (L) 01/19/2021   MCV 89.5 01/19/2021   PLT 230 01/19/2021      Chemistry      Component Value Date/Time   NA 139 01/19/2021 1119   K 3.9 01/19/2021 1119   CL 106 01/19/2021 1119   CO2 24 01/19/2021 1119   BUN 11 01/19/2021 1119   CREATININE 0.71 01/19/2021 1119   CREATININE 0.57 11/30/2020 1115      Component Value Date/Time   CALCIUM 9.0 01/19/2021 1119   ALKPHOS 139 (H) 01/19/2021 1119   AST 12 (L) 01/19/2021 1119   AST 13 (L) 11/30/2020 1115   ALT 9 01/19/2021 1119   ALT 9 11/30/2020 1115   BILITOT 0.5 01/19/2021 1119   BILITOT 0.8 11/30/2020 1115

## 2021-01-19 NOTE — Patient Instructions (Signed)
[facility] MEDICAL ONCOLOGY   ?Discharge Instructions: ?Thank you for choosing [facility] to provide your oncology and hematology care.  ? ?If you have a lab appointment with the Cancer Center, please go directly to the Cancer Center and check in at the registration area. ?  ?Wear comfortable clothing and clothing appropriate for easy access to any Portacath or PICC line.  ? ?We strive to give you quality time with your provider. You may need to reschedule your appointment if you arrive late (15 or more minutes).  Arriving late affects you and other patients whose appointments are after yours.  Also, if you miss three or more appointments without notifying the office, you may be dismissed from the clinic at the provider?s discretion.    ?  ?For prescription refill requests, have your pharmacy contact our office and allow 72 hours for refills to be completed.   ? ?Today you received the following chemotherapy and/or immunotherapy agents: pembrolizumab    ?  ?To help prevent nausea and vomiting after your treatment, we encourage you to take your nausea medication as directed. ? ?BELOW ARE SYMPTOMS THAT SHOULD BE REPORTED IMMEDIATELY: ?*FEVER GREATER THAN 100.4 F (38 ?C) OR HIGHER ?*CHILLS OR SWEATING ?*NAUSEA AND VOMITING THAT IS NOT CONTROLLED WITH YOUR NAUSEA MEDICATION ?*UNUSUAL SHORTNESS OF BREATH ?*UNUSUAL BRUISING OR BLEEDING ?*URINARY PROBLEMS (pain or burning when urinating, or frequent urination) ?*BOWEL PROBLEMS (unusual diarrhea, constipation, pain near the anus) ?TENDERNESS IN MOUTH AND THROAT WITH OR WITHOUT PRESENCE OF ULCERS (sore throat, sores in mouth, or a toothache) ?UNUSUAL RASH, SWELLING OR PAIN  ?UNUSUAL VAGINAL DISCHARGE OR ITCHING  ? ?Items with * indicate a potential emergency and should be followed up as soon as possible or go to the Emergency Department if any problems should occur. ? ?Please show the CHEMOTHERAPY ALERT CARD or IMMUNOTHERAPY ALERT CARD at  check-in to the Emergency Department and triage nurse. ? ?Should you have questions after your visit or need to cancel or reschedule your appointment, please contact [facility]ICAL ONCOLOG[facility]  Dept: [phone]  and follow the prompts.  Office hours are 8:00 a.m. to 4:30 p.m. Monday - Friday. Please note that voicemails left after 4:00 p.m. may not be returned until the following business day.  We are closed weekends and major holidays. You have access to a nurse at all times for urgent questions. Please call the main number to the clinic Dept: [phone] and follow the prompts. ? ? ?For any non-urgent questions, you may also contact your provider using MyChart. We now offer e-Visits for anyone 18 and older to request care online for non-urgent symptoms. For details visit [email or web address]. ?  ?Also download the MyChart app! Go to the app store, search "MyChart", open the app, select [place], and log in with your MyChart username and password. ? ?Due to Covid, a mask is required upon entering the hospital/clinic. If you do not have a mask, one will be given to you upon arrival. For doctor visits, patients may have 1 support person aged 18 or older with them. For treatment visits, patients cannot have anyone with them due to current Covid guidelines and our immunocompromised population.  ? ?

## 2021-01-19 NOTE — Assessment & Plan Note (Signed)
She is taking both anticoagulation therapy and antiplatelet agent She has no recent bleeding She will continue her medications as directed  

## 2021-01-19 NOTE — Assessment & Plan Note (Signed)
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

## 2021-02-03 ENCOUNTER — Other Ambulatory Visit: Payer: Self-pay

## 2021-02-09 ENCOUNTER — Inpatient Hospital Stay: Payer: Medicare Other

## 2021-02-09 ENCOUNTER — Other Ambulatory Visit: Payer: Self-pay

## 2021-02-09 VITALS — BP 135/58 | HR 80 | Temp 98.8°F | Resp 16 | Wt 204.8 lb

## 2021-02-09 DIAGNOSIS — C7951 Secondary malignant neoplasm of bone: Secondary | ICD-10-CM

## 2021-02-09 DIAGNOSIS — E039 Hypothyroidism, unspecified: Secondary | ICD-10-CM

## 2021-02-09 DIAGNOSIS — C801 Malignant (primary) neoplasm, unspecified: Secondary | ICD-10-CM

## 2021-02-09 DIAGNOSIS — C774 Secondary and unspecified malignant neoplasm of inguinal and lower limb lymph nodes: Secondary | ICD-10-CM

## 2021-02-09 DIAGNOSIS — Z7189 Other specified counseling: Secondary | ICD-10-CM

## 2021-02-09 DIAGNOSIS — C55 Malignant neoplasm of uterus, part unspecified: Secondary | ICD-10-CM

## 2021-02-09 DIAGNOSIS — C541 Malignant neoplasm of endometrium: Secondary | ICD-10-CM | POA: Diagnosis not present

## 2021-02-09 LAB — CBC WITH DIFFERENTIAL/PLATELET
Abs Immature Granulocytes: 0.01 10*3/uL (ref 0.00–0.07)
Basophils Absolute: 0 10*3/uL (ref 0.0–0.1)
Basophils Relative: 1 %
Eosinophils Absolute: 0.1 10*3/uL (ref 0.0–0.5)
Eosinophils Relative: 4 %
HCT: 34.9 % — ABNORMAL LOW (ref 36.0–46.0)
Hemoglobin: 11.3 g/dL — ABNORMAL LOW (ref 12.0–15.0)
Immature Granulocytes: 0 %
Lymphocytes Relative: 31 %
Lymphs Abs: 1 10*3/uL (ref 0.7–4.0)
MCH: 28.5 pg (ref 26.0–34.0)
MCHC: 32.4 g/dL (ref 30.0–36.0)
MCV: 88.1 fL (ref 80.0–100.0)
Monocytes Absolute: 0.3 10*3/uL (ref 0.1–1.0)
Monocytes Relative: 9 %
Neutro Abs: 1.8 10*3/uL (ref 1.7–7.7)
Neutrophils Relative %: 55 %
Platelets: 211 10*3/uL (ref 150–400)
RBC: 3.96 MIL/uL (ref 3.87–5.11)
RDW: 13.4 % (ref 11.5–15.5)
WBC: 3.3 10*3/uL — ABNORMAL LOW (ref 4.0–10.5)
nRBC: 0 % (ref 0.0–0.2)

## 2021-02-09 LAB — COMPREHENSIVE METABOLIC PANEL
ALT: 8 U/L (ref 0–44)
AST: 11 U/L — ABNORMAL LOW (ref 15–41)
Albumin: 4 g/dL (ref 3.5–5.0)
Alkaline Phosphatase: 118 U/L (ref 38–126)
Anion gap: 7 (ref 5–15)
BUN: 11 mg/dL (ref 8–23)
CO2: 26 mmol/L (ref 22–32)
Calcium: 9.1 mg/dL (ref 8.9–10.3)
Chloride: 108 mmol/L (ref 98–111)
Creatinine, Ser: 0.7 mg/dL (ref 0.44–1.00)
GFR, Estimated: >60 mL/min (ref >60)
Glucose, Bld: 94 mg/dL (ref 70–99)
Potassium: 3.8 mmol/L (ref 3.5–5.1)
Sodium: 141 mmol/L (ref 135–145)
Total Bilirubin: 0.6 mg/dL (ref 0.3–1.2)
Total Protein: 7.4 g/dL (ref 6.5–8.1)

## 2021-02-09 LAB — TSH: TSH: 1.509 u[IU]/mL (ref 0.308–3.960)

## 2021-02-09 MED ORDER — SODIUM CHLORIDE 0.9% FLUSH
10.0000 mL | Freq: Once | INTRAVENOUS | Status: AC
  Administered 2021-02-09: 11:00:00 10 mL

## 2021-02-09 MED ORDER — SODIUM CHLORIDE 0.9 % IV SOLN
200.0000 mg | Freq: Once | INTRAVENOUS | Status: AC
  Administered 2021-02-09: 12:00:00 200 mg via INTRAVENOUS
  Filled 2021-02-09: qty 8

## 2021-02-09 MED ORDER — HEPARIN SOD (PORK) LOCK FLUSH 100 UNIT/ML IV SOLN: 500.0000 [IU] | Freq: Once | INTRAVENOUS | Status: DC | PRN

## 2021-02-09 MED ORDER — SODIUM CHLORIDE 0.9% FLUSH: 10.0000 mL | INTRAVENOUS | Status: DC | PRN

## 2021-02-09 MED ORDER — SODIUM CHLORIDE 0.9 % IV SOLN: Freq: Once | INTRAVENOUS | Status: AC

## 2021-02-09 NOTE — Patient Instructions (Signed)
[facility]  Discharge Instructions: Thank you for choosing [facility] to provide your oncology and hematology care.   If you have a lab appointment with the Cancer Center, please go directly to the Cancer Center and check in at the registration area.   Wear comfortable clothing and clothing appropriate for easy access to any Portacath or PICC line.   We strive to give you quality time with your provider. You may need to reschedule your appointment if you arrive late (15 or more minutes).  Arriving late affects you and other patients whose appointments are after yours.  Also, if you miss three or more appointments without notifying the office, you may be dismissed from the clinic at the provider's discretion.      For prescription refill requests, have your pharmacy contact our office and allow 72 hours for refills to be completed.       To help prevent nausea and vomiting after your treatment, we encourage you to take your nausea medication as directed.  BELOW ARE SYMPTOMS THAT SHOULD BE REPORTED IMMEDIATELY: *FEVER GREATER THAN 100.4 F (38 C) OR HIGHER *CHILLS OR SWEATING *NAUSEA AND VOMITING THAT IS NOT CONTROLLED WITH YOUR NAUSEA MEDICATION *UNUSUAL SHORTNESS OF BREATH *UNUSUAL BRUISING OR BLEEDING *URINARY PROBLEMS (pain or burning when urinating, or frequent urination) *BOWEL PROBLEMS (unusual diarrhea, constipation, pain near the anus) TENDERNESS IN MOUTH AND THROAT WITH OR WITHOUT PRESENCE OF ULCERS (sore throat, sores in mouth, or a toothache) UNUSUAL RASH, SWELLING OR PAIN  UNUSUAL VAGINAL DISCHARGE OR ITCHING   Items with * indicate a potential emergency and should be followed up as soon as possible or go to the Emergency Department if any problems should occur.  Please show the CHEMOTHERAPY ALERT CARD or IMMUNOTHERAPY ALERT CARD at check-in to the Emergency Department and triage nurse.  Should you have questions after your  visit or need to cancel or reschedule your appointment, please contact [facility]  Dept: [phone]  and follow the prompts.  Office hours are 8:00 a.m. to 4:30 p.m. Monday - Friday. Please note that voicemails left after 4:00 p.m. may not be returned until the following business day.  We are closed weekends and major holidays. You have access to a nurse at all times for urgent questions. Please call the main number to the clinic Dept: [phone] and follow the prompts.   For any non-urgent questions, you may also contact your provider using MyChart. We now offer e-Visits for anyone 18 and older to request care online for non-urgent symptoms. For details visit [email or web address].   Also download the MyChart app! Go to the app store, search "MyChart", open the app, select [facility], and log in with your MyChart username and password.  Due to Covid, a mask is required upon entering the hospital/clinic. If you do not have a mask, one will be given to you upon arrival. For doctor visits, patients may have 1 support person aged 18 or older with them. For treatment visits, patients cannot have anyone with them due to current Covid guidelines and our immunocompromised population.   

## 2021-02-21 ENCOUNTER — Encounter: Payer: Self-pay | Admitting: Hematology and Oncology

## 2021-02-26 ENCOUNTER — Other Ambulatory Visit: Payer: Self-pay

## 2021-02-26 DIAGNOSIS — I82422 Acute embolism and thrombosis of left iliac vein: Secondary | ICD-10-CM

## 2021-02-26 NOTE — Addendum Note (Signed)
Addended byDoylene Bode on: 02/26/2021 08:12 PM   Modules accepted: Orders

## 2021-02-28 ENCOUNTER — Inpatient Hospital Stay: Payer: Medicare Other | Attending: Hematology and Oncology

## 2021-02-28 ENCOUNTER — Encounter (HOSPITAL_COMMUNITY): Payer: Self-pay

## 2021-02-28 ENCOUNTER — Other Ambulatory Visit: Payer: Self-pay

## 2021-02-28 ENCOUNTER — Ambulatory Visit (HOSPITAL_COMMUNITY)
Admission: RE | Admit: 2021-02-28 | Discharge: 2021-02-28 | Disposition: A | Discharge status: Home / Self Care | Payer: Medicare Other | Source: Ambulatory Visit | Attending: Hematology and Oncology | Admitting: Hematology and Oncology

## 2021-02-28 DIAGNOSIS — G893 Neoplasm related pain (acute) (chronic): Secondary | ICD-10-CM | POA: Insufficient documentation

## 2021-02-28 DIAGNOSIS — C55 Malignant neoplasm of uterus, part unspecified: Secondary | ICD-10-CM

## 2021-02-28 DIAGNOSIS — C7951 Secondary malignant neoplasm of bone: Secondary | ICD-10-CM | POA: Insufficient documentation

## 2021-02-28 DIAGNOSIS — C774 Secondary and unspecified malignant neoplasm of inguinal and lower limb lymph nodes: Secondary | ICD-10-CM | POA: Diagnosis present

## 2021-02-28 DIAGNOSIS — Z7901 Long term (current) use of anticoagulants: Secondary | ICD-10-CM | POA: Insufficient documentation

## 2021-02-28 DIAGNOSIS — Z923 Personal history of irradiation: Secondary | ICD-10-CM | POA: Insufficient documentation

## 2021-02-28 DIAGNOSIS — C541 Malignant neoplasm of endometrium: Secondary | ICD-10-CM | POA: Insufficient documentation

## 2021-02-28 DIAGNOSIS — Z86718 Personal history of other venous thrombosis and embolism: Secondary | ICD-10-CM | POA: Insufficient documentation

## 2021-02-28 DIAGNOSIS — Z5112 Encounter for antineoplastic immunotherapy: Secondary | ICD-10-CM | POA: Insufficient documentation

## 2021-02-28 DIAGNOSIS — Z79620 Long term (current) use of immunosuppressive biologic: Secondary | ICD-10-CM | POA: Insufficient documentation

## 2021-02-28 DIAGNOSIS — C779 Secondary and unspecified malignant neoplasm of lymph node, unspecified: Secondary | ICD-10-CM | POA: Insufficient documentation

## 2021-02-28 DIAGNOSIS — E039 Hypothyroidism, unspecified: Secondary | ICD-10-CM

## 2021-02-28 DIAGNOSIS — Z79899 Other long term (current) drug therapy: Secondary | ICD-10-CM | POA: Insufficient documentation

## 2021-02-28 DIAGNOSIS — Z90710 Acquired absence of both cervix and uterus: Secondary | ICD-10-CM | POA: Insufficient documentation

## 2021-02-28 LAB — COMPREHENSIVE METABOLIC PANEL
ALT: 7 U/L (ref 0–44)
AST: 11 U/L — ABNORMAL LOW (ref 15–41)
Albumin: 3.9 g/dL (ref 3.5–5.0)
Alkaline Phosphatase: 122 U/L (ref 38–126)
Anion gap: 5 (ref 5–15)
BUN: 13 mg/dL (ref 8–23)
CO2: 27 mmol/L (ref 22–32)
Calcium: 9.1 mg/dL (ref 8.9–10.3)
Chloride: 105 mmol/L (ref 98–111)
Creatinine, Ser: 0.62 mg/dL (ref 0.44–1.00)
GFR, Estimated: >60 mL/min (ref >60)
Glucose, Bld: 95 mg/dL (ref 70–99)
Potassium: 3.9 mmol/L (ref 3.5–5.1)
Sodium: 137 mmol/L (ref 135–145)
Total Bilirubin: 0.5 mg/dL (ref 0.3–1.2)
Total Protein: 7.4 g/dL (ref 6.5–8.1)

## 2021-02-28 LAB — TSH: TSH: 1.434 u[IU]/mL (ref 0.308–3.960)

## 2021-02-28 LAB — CBC WITH DIFFERENTIAL/PLATELET
Abs Immature Granulocytes: 0.01 10*3/uL (ref 0.00–0.07)
Basophils Absolute: 0 10*3/uL (ref 0.0–0.1)
Basophils Relative: 1 %
Eosinophils Absolute: 0.4 10*3/uL (ref 0.0–0.5)
Eosinophils Relative: 14 %
HCT: 34.5 % — ABNORMAL LOW (ref 36.0–46.0)
Hemoglobin: 11.5 g/dL — ABNORMAL LOW (ref 12.0–15.0)
Immature Granulocytes: 0 %
Lymphocytes Relative: 22 %
Lymphs Abs: 0.7 10*3/uL (ref 0.7–4.0)
MCH: 29 pg (ref 26.0–34.0)
MCHC: 33.3 g/dL (ref 30.0–36.0)
MCV: 87.1 fL (ref 80.0–100.0)
Monocytes Absolute: 0.3 10*3/uL (ref 0.1–1.0)
Monocytes Relative: 10 %
Neutro Abs: 1.7 10*3/uL (ref 1.7–7.7)
Neutrophils Relative %: 53 %
Platelets: 206 10*3/uL (ref 150–400)
RBC: 3.96 MIL/uL (ref 3.87–5.11)
RDW: 13.7 % (ref 11.5–15.5)
WBC: 3.2 10*3/uL — ABNORMAL LOW (ref 4.0–10.5)
nRBC: 0 % (ref 0.0–0.2)

## 2021-02-28 MED ORDER — HEPARIN SOD (PORK) LOCK FLUSH 100 UNIT/ML IV SOLN
INTRAVENOUS | Status: AC
  Filled 2021-02-28: qty 5

## 2021-02-28 MED ORDER — SODIUM CHLORIDE 0.9% FLUSH
10.0000 mL | Freq: Once | INTRAVENOUS | Status: AC
  Administered 2021-02-28: 10 mL

## 2021-02-28 MED ORDER — IOHEXOL 350 MG/ML SOLN
80.0000 mL | Freq: Once | INTRAVENOUS | Status: AC | PRN
  Administered 2021-02-28: 80 mL via INTRAVENOUS

## 2021-02-28 MED ORDER — SODIUM CHLORIDE (PF) 0.9 % IJ SOLN
INTRAMUSCULAR | Status: AC
  Filled 2021-02-28: qty 50

## 2021-03-01 ENCOUNTER — Encounter: Payer: Self-pay | Admitting: Hematology and Oncology

## 2021-03-02 ENCOUNTER — Inpatient Hospital Stay: Payer: Medicare Other

## 2021-03-02 ENCOUNTER — Other Ambulatory Visit: Payer: Self-pay

## 2021-03-02 ENCOUNTER — Inpatient Hospital Stay (HOSPITAL_BASED_OUTPATIENT_CLINIC_OR_DEPARTMENT_OTHER): Payer: Medicare Other | Admitting: Hematology and Oncology

## 2021-03-02 ENCOUNTER — Encounter: Payer: Self-pay | Admitting: Hematology and Oncology

## 2021-03-02 DIAGNOSIS — C7951 Secondary malignant neoplasm of bone: Secondary | ICD-10-CM

## 2021-03-02 DIAGNOSIS — Z79899 Other long term (current) drug therapy: Secondary | ICD-10-CM | POA: Diagnosis not present

## 2021-03-02 DIAGNOSIS — C55 Malignant neoplasm of uterus, part unspecified: Secondary | ICD-10-CM

## 2021-03-02 DIAGNOSIS — G893 Neoplasm related pain (acute) (chronic): Secondary | ICD-10-CM | POA: Diagnosis not present

## 2021-03-02 DIAGNOSIS — Z7189 Other specified counseling: Secondary | ICD-10-CM

## 2021-03-02 DIAGNOSIS — C801 Malignant (primary) neoplasm, unspecified: Secondary | ICD-10-CM

## 2021-03-02 DIAGNOSIS — I825Z2 Chronic embolism and thrombosis of unspecified deep veins of left distal lower extremity: Secondary | ICD-10-CM | POA: Diagnosis not present

## 2021-03-02 DIAGNOSIS — Z90710 Acquired absence of both cervix and uterus: Secondary | ICD-10-CM | POA: Diagnosis not present

## 2021-03-02 DIAGNOSIS — C541 Malignant neoplasm of endometrium: Secondary | ICD-10-CM | POA: Diagnosis present

## 2021-03-02 DIAGNOSIS — Z86718 Personal history of other venous thrombosis and embolism: Secondary | ICD-10-CM | POA: Diagnosis not present

## 2021-03-02 DIAGNOSIS — C774 Secondary and unspecified malignant neoplasm of inguinal and lower limb lymph nodes: Secondary | ICD-10-CM

## 2021-03-02 DIAGNOSIS — Z5112 Encounter for antineoplastic immunotherapy: Secondary | ICD-10-CM | POA: Diagnosis present

## 2021-03-02 DIAGNOSIS — Z7901 Long term (current) use of anticoagulants: Secondary | ICD-10-CM | POA: Diagnosis not present

## 2021-03-02 DIAGNOSIS — C779 Secondary and unspecified malignant neoplasm of lymph node, unspecified: Secondary | ICD-10-CM | POA: Diagnosis present

## 2021-03-02 DIAGNOSIS — Z923 Personal history of irradiation: Secondary | ICD-10-CM | POA: Diagnosis not present

## 2021-03-02 MED ORDER — SODIUM CHLORIDE 0.9 % IV SOLN
200.0000 mg | Freq: Once | INTRAVENOUS | Status: AC
  Administered 2021-03-02: 200 mg via INTRAVENOUS
  Filled 2021-03-02: qty 8

## 2021-03-02 MED ORDER — HEPARIN SOD (PORK) LOCK FLUSH 100 UNIT/ML IV SOLN
500.0000 [IU] | Freq: Once | INTRAVENOUS | Status: AC | PRN
  Administered 2021-03-02: 500 [IU]

## 2021-03-02 MED ORDER — SODIUM CHLORIDE 0.9 % IV SOLN: Freq: Once | INTRAVENOUS | Status: AC

## 2021-03-02 MED ORDER — SODIUM CHLORIDE 0.9% FLUSH
10.0000 mL | INTRAVENOUS | Status: DC | PRN
  Administered 2021-03-02: 10 mL

## 2021-03-02 NOTE — Progress Notes (Signed)
[place] OFFICE PROGRESS NOTE  Patient Care Team: Nolene Ebbs, MD as PCP - General (Internal Medicine)  ASSESSMENT & PLAN:  Uterine cancer [facility]) I have reviewed multiple imaging studies with the patient She has no signs of disease She has attained complete response to treatment We discussed the role of long-term treatment with pembrolizumab and she is in agreement to proceed I will continue to scan her every 6 months, next imaging study will be done in July of this year  Lower leg DVT (deep venous thromboembolism), chronic, left ([place]) She is taking both anticoagulation therapy and antiplatelet agent She has no recent bleeding She will continue her medications as directed  Cancer associated pain Her cancer associated pain is due to left hip fracture and related to her previous treatment She will continue her prescribed pain medicine We discussed the risk and benefits of hip surgery and for now, I recommend against surgery  No orders of the defined types were placed in this encounter.   All questions were answered. The patient knows to call the clinic with any problems, questions or concerns. The total time spent in the appointment was 30 minutes encounter with patients including review of chart and various tests results, discussions about plan of care and coordination of care plan   [name], MD 03/02/2021 12:54 PM  INTERVAL HISTORY: Please see below for problem oriented charting. she returns for treatment follow-up while on pembrolizumab for recurrent uterine cancer She is doing well Her chronic pain is stable She denies recent bleeding  REVIEW OF SYSTEMS:   Constitutional: Denies fevers, chills or abnormal weight loss Eyes: Denies blurriness of vision Ears, nose, mouth, throat, and face: Denies mucositis or sore throat Respiratory: Denies cough, dyspnea or wheezes Cardiovascular: Denies palpitation, chest discomfort  Gastrointestinal:  Denies  nausea, heartburn or change in bowel habits Skin: Denies abnormal skin rashes Lymphatics: Denies new lymphadenopathy or easy bruising Neurological:Denies numbness, tingling or new weaknesses Behavioral/Psych: Mood is stable, no new changes  All other systems were reviewed with the patient and are negative.  I have reviewed the past medical history, past surgical history, social history and family history with the patient and they are unchanged from previous note.  ALLERGIES:  has No Known Allergies.  MEDICATIONS:  Current Outpatient Medications  Medication Sig Dispense Refill   apixaban (ELIQUIS) 2.5 MG TABS tablet Take by mouth 2 (two) times daily.     diclofenac sodium (VOLTAREN) 1 % GEL APPLY 4GRAMS 4 TIMES A DAY AS NEEDED FOR PAINS     gabapentin (NEURONTIN) 300 MG capsule Take 300 mg by mouth 2 (two) times daily.     lidocaine-prilocaine (EMLA) cream Apply 1 application topically daily as needed. 30 g 3   methadone (DOLOPHINE) 10 MG tablet Take 1 tablet (10 mg total) by mouth every 12 (twelve) hours. 60 tablet 0   morphine (MSIR) 15 MG tablet Take 1 tablet (15 mg total) by mouth every 6 (six) hours as needed for severe pain. 60 tablet 0   Olopatadine HCl 0.2 % SOLN Place 1 drop into both eyes daily.     No current facility-administered medications for this visit.   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 Uterus +/- tubes/ovaries, neoplastic, with left fallopian tube and ovary - INVASIVE ENDOMETRIOID CARCINOMA (1.5 CM), FIGO GRADE  II, ARISING IN A BACKGROUND OF ATYPICAL COMPLEX HYPERPLASIA, CONFINED WITHIN INNER HALF OF THE MYOMETRIUM. - ENDOMETRIAL POLYP WITH ASSOCIATED ATYPICAL COMPLEX HYPERPLASIA. - MYOMETRIUM: LEIOMYOMATA. - CERVIX: BENIGN SQUAMOUS MUCOSA AND ENDOCERVICAL MUCOSA, NO DYSPLASIA OR MALIGNANCY. - LEFT OVARY: BENIGN OVARIAN TISSUE WITH ENDOSALPINGOSIS, NO EVIDENCE OF ATYPIA OR MALIGNANCY. - LEFT FALLOPIAN TUBE: NO HISTOLOGIC ABNORMALITIES. - PLEASE SEE ONCOLOGY TEMPLATE FOR DETAIL. 2. Ovary and fallopian tube, right - BENIGN OVARIAN TISSUE WITH ENDOSALPINGOSIS, NO ATYPIA OR MALIGNANCY. - BENIGN FALLOPIAN TUBAL TISSUE, NO PATHOLOGIC ABNORMALITIES. Microscopic Comment 1. UTERUS Specimen: Uterus, cervix, bilateral ovaries and fallopian tubes Procedure: Total hysterectomy and bilateral salpingo-oophorectomy Lymph node sampling performed: No Specimen integrity: Intact Maximum tumor size (cm): 1.5 cm, glass slide measurement Histologic type: Invasive endometrioid carcinoma Grade: FIGO grade II Myometrial invasion: 1 cm where myometrium is 2.3 cm in thickness Cervical stromal involvement: No Extent of involvement of other organs: No Lymph vascular invasion: Not identified Peritoneal washings: Negative ([id number]) Lymph nodes: number examined N/A; number positive N/A TNM code: pT1a, pNX 1 oFf 3IGO Stage (based on pathologic findings, needs clinical correlation): IA  Comments: Sections the endomyometrium away from the grossly identified endometrial polyp show an invasive FIGO grade II endometrioid carcinoma. The tumor is confined within inner half of the myometrium. No angiolymphatic invasion is identified. No cervical stromal involvement is identified. Sections of the grossly identified endometrial polyp show an endometrial polyp with associated atypical compacted hyperplasia with no definitive evidence of carcinoma.   12/07/2017 Imaging   US venous Doppler Right: No evidence of common femoral vein  obstruction. Left: Findings consistent with acute deep vein thrombosis involving the left femoral vein, left proximal profunda vein, and left popliteal vein. Unable to adequately interrogate the common femoral and higher, or the calf secondary to significant edema and body habitus   12/07/2017 [facility] Admission   She presented to the ER and was diagnosed with acute DVT   01/18/2018 - 01/21/2018 Hospital Admission   She was admitted to the hospital for management of severe persistent DVT   01/18/2018 Imaging   US venous Doppler Right: No evidence of common femoral vein obstruction. Left: Findings consistent with acute deep vein thrombosis involving the left common femoral vein, and left popliteal vein.   01/19/2018 Surgery   Pre-operative Diagnosis: Subacute DVT with severe post thrombotic syndrome Post-operative diagnosis:  Same Surgeon:  Erlene Quan C. Donzetta Matters, MD Procedure Performed: 1.  Ultrasound-guided cannulation left small saphenous vein 2.  Left lower extremity and central venography 3.  Intravascular ultrasound of left popliteal, femoral, common femoral, external and common iliac veins and IVC 4.  Stent of left common and external iliac veins with 14 x 60 mm Vici 5.  Moderate sedation with fentanyl and Versed for 50 minutes   Indications: 76 year old female with a history of DVT in October now presents with persistent left lower extremity swelling and ultrasound demonstrating likely persistent DVT.  She has been on Xarelto at this time.  She is now indicated for venogram possible intervention.   Findings: Flow in the left lower extremity was stagnant throughout but by venogram all veins were patent.  There was a focal occlusive area approximately 2 cm in length  at the common and external iliac vein junction at the hypogastric on the left.  After stenting and ballooning we had a diameter of 12 millimeters in the stent and venogram demonstrated flow in the lower extremity veins were  previously was stagnant and no further residual stenosis in the left common and external iliac vein junction.   04/12/2018 Imaging   US Venous Doppler Right: No evidence of common femoral vein obstruction. Left: There is no evidence of deep vein thrombosis in the lower extremity. However, portions of this examination were limited- see technologist comments above. Left groin: Large hypoechoic area with mixed echoes noted measuring nearly 10 cm. Possible  hematoma versus unknown etiology. Ultrasound characteristics of enlarged lymph nodes noted in the groin.      05/15/2018 Imaging   US Venous Doppler Right: No evidence of deep vein thrombosis in the lower extremity. No indirect evidence of obstruction proximal to the inguinal ligament. Left: No reflux was noted in the common femoral vein , femoral vein in the thigh, popliteal vein, great saphenous vein at the saphenofemoral junction, great saphenous vein at the proximal thigh, great saphenous vein at the mid thigh, great saphenous vein  at the distal thigh, great saphenous vein at the knee, origin of the small saphenous vein, proximal small saphenous vein, and mid small saphenous vein. There is no evidence of deep vein thrombosis in the lower extremity. There is no evidence of superficial venous thrombosis. No cystic structure found in the popliteal fossa. Unable to evaluate extension of common femoral vein obstruction proximal to the inguinal ligament.   06/01/2018 Imaging   1. 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 Additionally, there is lucency involving the medial sidewall of the left acetabulum with potential nondisplaced pathologic fracture. Further evaluation with contrast-enhanced pelvic MRI could be performed as clinically indicated. 2. The left pelvic arterial and venous system is encased by this infiltrative left pelvic sidewall mass  however while difficult to ascertain, the left external iliac venous stent appears patent.   06/18/2018 Pathology Results   Lymph node for lymphoma, Left Inguinal - METASTATIC ADENOCARCINOMA, SEE COMMENT. Microscopic Comment Immunohistochemistry is positive for cytokeratin 7, PAX8, ER, and PR. Cytokeratin 5/6,and p63 are negative. The immunoprofile along with the patient's history are consistent with a gynecologic primary.   06/18/2018 Surgery   Pre-op Diagnosis: INGUINAL LYMPHADENOPATHY, PELVIC MASS      Procedure(s): EXCISIONAL BIOPSY DEEP LEFT INGUINAL LYMPH NODE   Surgeon(s): Coralie Keens, MD      06/24/2018 Cancer Staging   Staging form: Corpus Uteri - Carcinoma and Carcinosarcoma, AJCC 8th Edition - Clinical: Stage IVB (cT1a, cN2, pM1) - Signed by [name], MD on 06/24/2018     Genetic Testing   Patient has genetic testing done for MMR on pathology from 06/18/2018. Results revealed patient has the following mutation(s): MMR: abnormal   06/29/2018 Procedure   Placement of a subcutaneous port device. Catheter tip at the SVC and right atrium junction.    Genetic Testing   Patient has genetic testing done for MSI on pathology from 06/18/2018. Results revealed patient has the following mutation(s): MSI: High   07/02/2018 PET scan   Previous hysterectomy, with asymmetric focus of hypermetabolic activity in the left vaginal cuff, suspicious for residual or recurrent carcinoma.   Large hypermetabolic soft tissue mass involving the left pelvic sidewall and acetabulum, consistent with metastatic disease.   No evidence metastatic disease within the abdomen,  chest, or neck.   07/09/2018 Tumor Marker   Patient's tumor was tested for the following markers: CA-125 Results of the tumor marker test revealed 9   07/10/2018 - 08/24/2018 Chemotherapy   The patient had carboplatin and taxol x 3 cycles   07/17/2018 Genetic Testing   Negative genetic testing on the common hereditary cancer  panel.  The Common Hereditary Gene Panel offered by [facility] includes sequencing and/or deletion duplication testing of the following 48 genes: APC, ATM, AXIN2, BARD1, BMPR1A, BRCA1, BRCA2, BRIP1, CDH1, CDK4, CDKN2A (p14ARF), CDKN2A (p16INK4a), CHEK2, CTNNA1, DICER1, EPCAM (Deletion/duplication testing only), GREM1 (promoter region deletion/duplication testing only), KIT, MEN1, MLH1, MSH2, MSH3, MSH6, MUTYH, NBN, NF1, NHTL1, PALB2, PDGFRA, PMS2, POLD1, POLE, PTEN, RAD50, RAD51C, RAD51D, RNF43, SDHB, SDHC, SDHD, SMAD4, SMARCA4. STK11, TP53, TSC1, TSC2, and VHL.  The following genes were evaluated for sequence changes only: SDHA and HOXB13 c.251G>A variant only. The report date is Jul 17, 2018.    10/03/2018 Imaging   CT abdomen and pelvis 1.  No acute intra-abdominal process. 2. Grossly unchanged left pelvic sidewall mass with osseous involvement of the medial acetabulum. Progressive mild displacement of the associated comminuted pathologic fracture involving the right acetabulum and puboacetabular junction.  3. New venous stents extending from the left common iliac vein origin to the proximal left common femoral vein. The stents are patent.   11/06/2018 -  Chemotherapy   The patient had pembrolizumab for chemotherapy treatment.     01/28/2019 Imaging   1. No substantial interval change in exam. 2. Interval development of mild fullness in the left intrarenal collecting system and ureter without overt hydronephrosis at this time. 3. Abnormal soft tissue along the left pelvic sidewall has decreased slightly in the interval. 4. Similar appearance of ill-defined fascial planes in the pelvis with some peritoneal thickening along the right pelvic sidewall and potentially involving the sigmoid mesocolon. 5. No substantial ascites.   05/03/2019 Imaging   1. Stable mild left pelvic sidewall soft tissue density. No new or progressive disease identified within the abdomen or pelvis.  2. Colonic  diverticulosis. No radiographic evidence of diverticulitis.   Aortic Atherosclerosis (ICD10-I70.0).   09/09/2019 Imaging   1. No change in appearance of soft tissue thickening along the LEFT pelvic sidewall adjacent to chronic LEFT acetabular fracture. 2. Mild asymmetry of the bladder wall favoring the LEFT bladder wall, not well assessed. Similar accounting for variable degrees of distension on prior studies potentially related to prior radiation, attention on follow-up. 3. Signs of venous stenting in the LEFT hemipelvis with LEFT lower extremity muscular atrophy and mild stranding with similar appearance. Signs of colonic diverticulosis and diverticular disease without change.   02/24/2020 Imaging   1. Unchanged appearance of the pelvis as detailed below. 2. Unchanged soft tissue thickening of the left pelvic sidewall. 3. 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 4. No evidence discrete mass or lymphadenopathy nor metastatic disease in the abdomen or pelvis. 5. Status post hysterectomy and cholecystectomy. 6. Left common iliac vein stent. 7. Pancolonic diverticulosis.     09/07/2020 Imaging   Stable abnormal soft tissue density in the left pelvic sidewall. No new or progressive disease within the abdomen or pelvis.   Colonic diverticulosis. No radiographic evidence of diverticulitis.   Stable severe destructive left hip arthropathy with fracture involving the medial acetabular wall.     03/01/2021 Imaging   Stable abnormal soft tissue  density in the left pelvic sidewall. 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   No new or progressive disease within the abdomen or pelvis.   Colonic diverticulosis, without radiographic evidence of diverticulitis.   Tiny hiatal hernia.   Metastasis to lymph nodes ([place])  06/23/2018 Initial Diagnosis   Metastasis to lymph nodes  ([place])   07/10/2018 - 08/24/2018 Chemotherapy   The patient had palonosetron (ALOXI) injection 0.25 mg, 0.25 mg, Intravenous,  Once, 3 of 6 cycles Administration: 0.25 mg (07/10/2018), 0.25 mg (07/31/2018), 0.25 mg (08/24/2018) CARBOplatin (PARAPLATIN) 480 mg in sodium chloride 0.9 % 250 mL chemo infusion, 480 mg (100 % of original dose 482.5 mg), Intravenous,  Once, 3 of 6 cycles Dose modification: 482.5 mg (original dose 482.5 mg, Cycle 1) Administration: 480 mg (07/10/2018), 480 mg (07/31/2018), 480 mg (08/24/2018) PACLitaxel (TAXOL) 276 mg in sodium chloride 0.9 % 250 mL chemo infusion (> 74m/m2), 140 mg/m2 = 276 mg (80 % of original dose 175 mg/m2), Intravenous,  Once, 3 of 6 cycles Dose modification: 140 mg/m2 (80 % of original dose 175 mg/m2, Cycle 1, Reason: Dose Not Tolerated) Administration: 276 mg (07/10/2018), 276 mg (07/31/2018), 276 mg (08/24/2018) fosaprepitant (EMEND) 150 mg, dexamethasone (DECADRON) 12 mg in sodium chloride 0.9 % 145 mL IVPB, , Intravenous,  Once, 3 of 6 cycles Administration:  (07/10/2018),  (07/31/2018),  (08/24/2018)   for chemotherapy treatment.     11/06/2018 -  Chemotherapy   The patient had pembrolizumab for chemotherapy treatment.     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  06/24/2018 Initial Diagnosis   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   07/10/2018 - 08/24/2018 Chemotherapy   The patient had palonosetron (ALOXI) injection 0.25 mg, 0.25 mg, Intravenous,  Once, 3 of 6 cycles Administration: 0.25 mg (07/10/2018), 0.25 mg (07/31/2018), 0.25 mg (08/24/2018) CARBOplatin (PARAPLATIN) 480 mg in sodium chloride 0.9 % 250 mL chemo infusion, 480 mg (100 % of original dose 482.5 mg), Intravenous,  Once, 3 of 6 cycles Dose modification: 482.5 mg (original dose 482.5 mg, Cycle 1) Administration: 480 mg (07/10/2018), 480 mg (07/31/2018), 480 mg (08/24/2018) PACLitaxel (TAXOL) 276 mg in sodium chloride 0.9 % 250 mL chemo infusion (> 831mm2), 140 mg/m2 = 276 mg (80 % of original dose 175 mg/m2), Intravenous,   Once, 3 of 6 cycles Dose modification: 140 mg/m2 (80 % of original dose 175 mg/m2, Cycle 1, Reason: Dose Not Tolerated) Administration: 276 mg (07/10/2018), 276 mg (07/31/2018), 276 mg (08/24/2018) fosaprepitant (EMEND) 150 mg, dexamethasone (DECADRON) 12 mg in sodium chloride 0.9 % 145 mL IVPB, , Intravenous,  Once, 3 of 6 cycles Administration:  (07/10/2018),  (07/31/2018),  (08/24/2018)   for chemotherapy treatment.     11/06/2018 -  Chemotherapy   The patient had pembrolizumab for chemotherapy treatment.     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       PHYSICAL EXAMINATION: ECOG PERFORMANCE STATUS: 2 - Symptomatic, <50% confined to bed  Vitals:   03/02/21 1046  BP: (!) 128/58  Pulse: 81  Resp: 18  Temp: 98.2 F (36.8 C)  SpO2: 97%   Filed Weights   03/02/21 1046  Weight: 225 lb 9.6 oz (102.3 kg)    GENERAL:alert, no distress and comfortable NEURO: alert & oriented x 3 with fluent speech, no focal motor/sensory deficits  LABORATORY  DATA:  I have reviewed the data as listed    Component Value Date/Time   NA 137 02/28/2021 1017   K 3.9 02/28/2021 1017   CL 105 02/28/2021 1017   CO2 27 02/28/2021 1017   GLUCOSE 95 02/28/2021 1017   BUN 13 02/28/2021 1017   CREATININE 0.62 02/28/2021 1017   CREATININE 0.57 11/30/2020 1115   CALCIUM 9.1 02/28/2021 1017   PROT 7.4 02/28/2021 1017   ALBUMIN 3.9 02/28/2021 1017   AST 11 (L) 02/28/2021 1017   AST 13 (L) 11/30/2020 1115   ALT 7 02/28/2021 1017   ALT 9 11/30/2020 1115   ALKPHOS 122 02/28/2021 1017   BILITOT 0.5 02/28/2021 1017   BILITOT 0.8 11/30/2020 1115   GFRNONAA >60 02/28/2021 1017   GFRNONAA >60 11/30/2020 1115   GFRAA >60 11/12/2019 1222    No results found  for: SPEP, UPEP  Lab Results  Component Value Date   WBC 3.2 (L) 02/28/2021   NEUTROABS 1.7 02/28/2021   HGB 11.5 (L) 02/28/2021   HCT 34.5 (L) 02/28/2021   MCV 87.1 02/28/2021   PLT 206 02/28/2021      Chemistry      Component Value Date/Time   NA 137 02/28/2021 1017   K 3.9 02/28/2021 1017   CL 105 02/28/2021 1017   CO2 27 02/28/2021 1017   BUN 13 02/28/2021 1017   CREATININE 0.62 02/28/2021 1017   CREATININE 0.57 11/30/2020 1115      Component Value Date/Time   CALCIUM 9.1 02/28/2021 1017   ALKPHOS 122 02/28/2021 1017   AST 11 (L) 02/28/2021 1017   AST 13 (L) 11/30/2020 1115   ALT 7 02/28/2021 1017   ALT 9 11/30/2020 1115   BILITOT 0.5 02/28/2021 1017   BILITOT 0.8 11/30/2020 1115       RADIOGRAPHIC STUDIES: I have reviewed multiple imaging studies with the patient I have personally reviewed the radiological images as listed and agreed with the findings in the report. CT ABDOMEN PELVIS W CONTRAST  Result Date: 02/28/2021 CLINICAL DATA:  Follow-up metastatic endometrial carcinoma. Ongoing chemotherapy. Previous radiation therapy. EXAM: CT ABDOMEN AND PELVIS WITH CONTRAST TECHNIQUE: Multidetector CT imaging of the abdomen and pelvis was performed using the standard protocol following bolus administration of intravenous contrast. CONTRAST:  104m OMNIPAQUE IOHEXOL 350 MG/ML SOLN COMPARISON:  09/06/2020 FINDINGS: Lower Chest: No acute findings. Hepatobiliary: No hepatic masses identified. Prior cholecystectomy. No evidence of biliary obstruction. Pancreas:  No mass or inflammatory changes. Spleen: Within normal limits in size and appearance. Adrenals/Urinary Tract: No masses identified. No evidence of ureteral calculi or hydronephrosis. Stomach/Bowel: Tiny hiatal hernia. No evidence of obstruction, inflammatory process or abnormal fluid collections. Normal appendix visualized. Diffuse colonic diverticulosis again noted, without evidence of diverticulitis. Vascular/Lymphatic: No  evidence of abdominal lymphadenopathy. Abnormal soft tissue density along the left pelvic sidewall and medial wall of the left acetabulum remains stable. Stent again seen within the left common and external iliac veins. No acute vascular findings. Reproductive:  No mass or other significant abnormality. Other:  None. Musculoskeletal: No suspicious bone lesions identified. Severe left hip arthropathy and chronic fracture of the medial left acetabulum remains stable in appearance. IMPRESSION: Stable abnormal soft tissue density in the left pelvic sidewall. 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 No new or progressive disease within the abdomen or pelvis. Colonic diverticulosis, without radiographic evidence of diverticulitis. Tiny hiatal hernia. Electronically Signed   By: JMarlaine HindM.D.   On: 02/28/2021 14:42

## 2021-03-02 NOTE — Assessment & Plan Note (Signed)
Her cancer associated pain is due to left hip fracture and related to her previous treatment She will continue her prescribed pain medicine We discussed the risk and benefits of hip surgery and for now, I recommend against surgery

## 2021-03-02 NOTE — Assessment & Plan Note (Signed)
I have reviewed multiple imaging studies with the patient She has no signs of disease She has attained complete response to treatment We discussed the role of long-term treatment with pembrolizumab and she is in agreement to proceed I will continue to scan her every 6 months, next imaging study will be done in July of this year

## 2021-03-02 NOTE — Assessment & Plan Note (Signed)
She is taking both anticoagulation therapy and antiplatelet agent She has no recent bleeding She will continue her medications as directed  

## 2021-03-02 NOTE — Patient Instructions (Signed)
[facility]  Discharge Instructions: ?Thank you for choosing [facility] to provide your oncology and hematology care.  ? ?If you have a lab appointment with the Cancer Center, please go directly to the Cancer Center and check in at the registration area. ?  ?Wear comfortable clothing and clothing appropriate for easy access to any Portacath or PICC line.  ? ?We strive to give you quality time with your provider. You may need to reschedule your appointment if you arrive late (15 or more minutes).  Arriving late affects you and other patients whose appointments are after yours.  Also, if you miss three or more appointments without notifying the office, you may be dismissed from the clinic at the provider?s discretion.    ?  ?For prescription refill requests, have your pharmacy contact our office and allow 72 hours for refills to be completed.   ? ?Today you received the following chemotherapy and/or immunotherapy agents: Keytruda ?  ?To help prevent nausea and vomiting after your treatment, we encourage you to take your nausea medication as directed. ? ?BELOW ARE SYMPTOMS THAT SHOULD BE REPORTED IMMEDIATELY: ?*FEVER GREATER THAN 100.4 F (38 ?C) OR HIGHER ?*CHILLS OR SWEATING ?*NAUSEA AND VOMITING THAT IS NOT CONTROLLED WITH YOUR NAUSEA MEDICATION ?*UNUSUAL SHORTNESS OF BREATH ?*UNUSUAL BRUISING OR BLEEDING ?*URINARY PROBLEMS (pain or burning when urinating, or frequent urination) ?*BOWEL PROBLEMS (unusual diarrhea, constipation, pain near the anus) ?TENDERNESS IN MOUTH AND THROAT WITH OR WITHOUT PRESENCE OF ULCERS (sore throat, sores in mouth, or a toothache) ?UNUSUAL RASH, SWELLING OR PAIN  ?UNUSUAL VAGINAL DISCHARGE OR ITCHING  ? ?Items with * indicate a potential emergency and should be followed up as soon as possible or go to the Emergency Department if any problems should occur. ? ?Please show the CHEMOTHERAPY ALERT CARD or IMMUNOTHERAPY ALERT CARD at check-in to the  Emergency Department and triage nurse. ? ?Should you have questions after your visit or need to cancel or reschedule your appointment, please contact [facility]  Dept: [phone]  and follow the prompts.  Office hours are 8:00 a.m. to 4:30 p.m. Monday - Friday. Please note that voicemails left after 4:00 p.m. may not be returned until the following business day.  We are closed weekends and major holidays. You have access to a nurse at all times for urgent questions. Please call the main number to the clinic Dept: [phone] and follow the prompts. ? ? ?For any non-urgent questions, you may also contact your provider using MyChart. We now offer e-Visits for anyone 18 and older to request care online for non-urgent symptoms. For details visit [email or web address]. ?  ?Also download the MyChart app! Go to the app store, search "MyChart", open the app, select , and log in with your MyChart username and password. ? ?Due to Covid, a mask is required upon entering the hospital/clinic. If you do not have a mask, one will be given to you upon arrival. For doctor visits, patients may have 1 support person aged 18 or older with them. For treatment visits, patients cannot have anyone with them due to current Covid guidelines and our immunocompromised population.  ? ?

## 2021-03-04 ENCOUNTER — Encounter: Payer: Self-pay | Admitting: Hematology and Oncology

## 2021-03-14 ENCOUNTER — Ambulatory Visit (INDEPENDENT_AMBULATORY_CARE_PROVIDER_SITE_OTHER): Payer: Medicare Other | Admitting: Physician Assistant

## 2021-03-14 ENCOUNTER — Ambulatory Visit (HOSPITAL_COMMUNITY)
Admission: RE | Admit: 2021-03-14 | Discharge: 2021-03-14 | Disposition: A | Discharge status: Home / Self Care | Payer: Medicare Other | Source: Ambulatory Visit | Attending: Vascular Surgery | Admitting: Vascular Surgery

## 2021-03-14 ENCOUNTER — Other Ambulatory Visit: Payer: Self-pay

## 2021-03-14 VITALS — BP 104/63 | HR 81 | Temp 98.0°F | Ht 61.0 in | Wt 228.7 lb

## 2021-03-14 DIAGNOSIS — I82422 Acute embolism and thrombosis of left iliac vein: Secondary | ICD-10-CM

## 2021-03-14 NOTE — Progress Notes (Signed)
Office Note     CC:  follow up Requesting Provider:  Nolene Ebbs, MD  HPI: [name] is a 76 y.o. (Apr 02, 1945) female who presents for surveillance of IVC and iliac veins.  She underwent left common and external iliac vein stenting secondary to invasive endometrial cancer.  She subsequently underwent radiation and had occluded stents.  She then underwent mechanical thrombectomy with repeat stenting down to the common femoral vein.  She denies any increase in edema of left lower extremity.  She continues to take Plavix and Eliquis.  She remains active with her oncologist with serial scans every 6 months.  She is ambulating without difficulty.  She is wearing compression stockings religiously.   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   Primary localized osteoarthritis of right knee    Uterine cancer ([place]) 06/23/2018    Past Surgical History:  Procedure Laterality Date   ABDOMINAL HYSTERECTOMY  2012   CHOLECYSTECTOMY N/A 03/09/2013   Procedure: LAPAROSCOPIC CHOLECYSTECTOMY;  Surgeon: Gayland Curry, MD;  Location: [facility];  Service: General;  Laterality: N/A;   COLONOSCOPY W/ BIOPSIES     IR IMAGING GUIDED PORT INSERTION  06/29/2018   LARYNGOSCOPY Left 03/14/2017   Procedure: LARYNGOSCOPY;  Surgeon: Helayne Seminole, MD;  Location: [facility];  Service: ENT;  Laterality: Left;   LOWER EXTREMITY VENOGRAPHY Left 01/19/2018   Procedure: LOWER EXTREMITY VENOGRAPHY;  Surgeon: Waynetta Sandy, MD;  Location: [facility];  Service: Cardiovascular;   Laterality: Left;   LOWER EXTREMITY VENOGRAPHY N/A 09/08/2018   Procedure: LOWER EXTREMITY VENOGRAPHY;  Surgeon: Waynetta Sandy, MD;  Location: [facility];  Service: Cardiovascular;  Laterality: N/A;   LYMPH NODE BIOPSY Left 06/18/2018   Procedure: EXCISIONAL BIOPSY LEFT INGUINAL LYMPH NODE;  Surgeon: Coralie Keens, MD;  Location: [facility];  Service: General;  Laterality: Left;   PERIPHERAL VASCULAR INTERVENTION Left 01/19/2018   Procedure: PERIPHERAL VASCULAR INTERVENTION;  Surgeon: Waynetta Sandy, MD;  Location: [facility];  Service: Cardiovascular;  Laterality: Left;  LEFT ILIAC VENOUS   PERIPHERAL VASCULAR INTERVENTION Left 09/08/2018   Procedure: PERIPHERAL VASCULAR INTERVENTION;  Surgeon: Waynetta Sandy, MD;  Location: [facility];  Service: Cardiovascular;  Laterality: Left;  lower extremity   TOTAL KNEE ARTHROPLASTY  04/29/2011   Procedure: TOTAL KNEE ARTHROPLASTY;  Surgeon: Lorn Junes, MD;  Location: [facility];  Service: Orthopedics;  Laterality: Left;  DR Ephriam Knuckles 90 MINUTES FOR THIS CASE   TOTAL KNEE ARTHROPLASTY Right 07/04/2014   Procedure: TOTAL KNEE ARTHROPLASTY;  Surgeon: Elsie Saas, MD;  Location: [facility];  Service: Orthopedics;  Laterality: Right;    Social History   Socioeconomic History   Marital status: Divorced    Spouse name: Not on file   Number of children: 1   Years of education: Not on file   Highest education level: Not on file  Occupational History  Occupation: Retired   Tobacco Use   Smoking status: Former    Years: 1.00    Types: Cigarettes    Quit date: 04/22/1988    Years since quitting: 32.9   Smokeless tobacco: Never  Vaping Use   Vaping Use: Never used  Substance and Sexual Activity   Alcohol use: No   Drug use: No   Sexual activity: Yes    Birth control/protection: Surgical  Other Topics Concern   Not on file  Social History Narrative   Daily caffeine    Social Determinants of  Health   Financial Resource Strain: Not on file  Food Insecurity: Not on file  Transportation Needs: Not on file  Physical Activity: Not on file  Stress: Not on file  Social Connections: Not on file  Intimate Partner Violence: Not on file    Family History  Problem Relation Age of Onset   Arthritis Mother    Hypertension Mother    Alzheimer's disease Father    Diabetes Sister    Hypertension Sister    Hypertension Brother    Stroke Brother    Hypertension Brother    Hypertension Sister    Hypertension Sister    Hypertension Sister    Breast cancer Other        Niece   Breast cancer Niece 24       sister's daughter   Anesthesia problems Neg Hx    Hypotension Neg Hx    Malignant hyperthermia Neg Hx    Pseudochol deficiency Neg Hx    Colon cancer Neg Hx     Current Outpatient Medications  Medication Sig Dispense Refill   apixaban (ELIQUIS) 2.5 MG TABS tablet Take by mouth 2 (two) times daily.     diclofenac sodium (VOLTAREN) 1 % GEL APPLY 4GRAMS 4 TIMES A DAY AS NEEDED FOR PAINS     gabapentin (NEURONTIN) 300 MG capsule Take 300 mg by mouth 3 (three) times daily.     lidocaine-prilocaine (EMLA) cream Apply 1 application topically daily as needed. 30 g 3   methadone (DOLOPHINE) 10 MG tablet Take 1 tablet (10 mg total) by mouth every 12 (twelve) hours. 60 tablet 0   morphine (MSIR) 15 MG tablet Take 1 tablet (15 mg total) by mouth every 6 (six) hours as needed for severe pain. 60 tablet 0   Olopatadine HCl 0.2 % SOLN Place 1 drop into both eyes daily.     No current facility-administered medications for this visit.    No Known Allergies   REVIEW OF SYSTEMS:   [X]  denotes positive finding, [ ]  denotes negative finding Cardiac  Comments:  Chest pain or chest pressure:    Shortness of breath upon exertion:    Short of breath when lying flat:    Irregular heart rhythm:        Vascular    Pain in calf, thigh, or hip brought on by ambulation:    Pain in feet at  night that wakes you up from your sleep:     Blood clot in your veins:    Leg swelling:         Pulmonary    Oxygen at home:    Productive cough:     Wheezing:         Neurologic    Sudden weakness in arms or legs:     Sudden numbness in arms or legs:     Sudden onset of difficulty speaking or slurred speech:    Temporary  loss of vision in one eye:     Problems with dizziness:         Gastrointestinal    Blood in stool:     Vomited blood:         Genitourinary    Burning when urinating:     Blood in urine:        Psychiatric    Major depression:         Hematologic    Bleeding problems:    Problems with blood clotting too easily:        Skin    Rashes or ulcers:        Constitutional    Fever or chills:      PHYSICAL EXAMINATION:  Vitals:   03/14/21 0942  BP: 104/63  Pulse: 81  Temp: 98 F (36.7 C)  Weight: 228 lb 11.2 oz (103.7 kg)  Height: 5\' 1"  (1.549 m)    General:  WDWN in NAD; vital signs documented above Gait: Not observed HENT: WNL, normocephalic Pulmonary: normal non-labored breathing , without Rales, rhonchi,  wheezing Cardiac: regular HR Abdomen: soft, NT, no masses Skin: without rashes Extremities: Feet are warm and well-perfused; no venous ulcerations or other nonhealing wounds Musculoskeletal: no muscle wasting or atrophy  Neurologic: A&O X 3;  No focal weakness or paresthesias are detected Psychiatric:  The pt has Normal affect.   Non-Invasive Vascular Imaging:   Widely patent IVC Patent left iliac venous system    ASSESSMENT/PLAN:: 76 y.o. female here for follow up for surveillance of prior left iliac vein stenting  -Subjectively patient has not noticed any increase in edema of left leg and she continues to wear her compression socks religiously -IVC and iliac vein duplex demonstrates widely patent left iliac venous system -Continue Eliquis and Plavix as long as she can tolerate -Repeat IVC/iliac venous duplex in 1  year   Dagoberto Ligas, PA-C [facility] [phone]  Clinic MD:   Donzetta Matters

## 2021-03-23 ENCOUNTER — Inpatient Hospital Stay (HOSPITAL_BASED_OUTPATIENT_CLINIC_OR_DEPARTMENT_OTHER): Payer: Medicare Other | Admitting: Hematology and Oncology

## 2021-03-23 ENCOUNTER — Encounter: Payer: Self-pay | Admitting: Hematology and Oncology

## 2021-03-23 ENCOUNTER — Inpatient Hospital Stay: Payer: Medicare Other

## 2021-03-23 ENCOUNTER — Other Ambulatory Visit: Payer: Self-pay

## 2021-03-23 ENCOUNTER — Inpatient Hospital Stay: Payer: Medicare Other | Attending: Hematology and Oncology

## 2021-03-23 VITALS — BP 136/60 | HR 92 | Temp 98.0°F | Resp 18

## 2021-03-23 DIAGNOSIS — R5381 Other malaise: Secondary | ICD-10-CM | POA: Diagnosis not present

## 2021-03-23 DIAGNOSIS — D61818 Other pancytopenia: Secondary | ICD-10-CM | POA: Diagnosis not present

## 2021-03-23 DIAGNOSIS — C7951 Secondary malignant neoplasm of bone: Secondary | ICD-10-CM

## 2021-03-23 DIAGNOSIS — Z5112 Encounter for antineoplastic immunotherapy: Secondary | ICD-10-CM | POA: Insufficient documentation

## 2021-03-23 DIAGNOSIS — C55 Malignant neoplasm of uterus, part unspecified: Secondary | ICD-10-CM | POA: Diagnosis not present

## 2021-03-23 DIAGNOSIS — Z86718 Personal history of other venous thrombosis and embolism: Secondary | ICD-10-CM | POA: Diagnosis not present

## 2021-03-23 DIAGNOSIS — C541 Malignant neoplasm of endometrium: Secondary | ICD-10-CM | POA: Insufficient documentation

## 2021-03-23 DIAGNOSIS — G893 Neoplasm related pain (acute) (chronic): Secondary | ICD-10-CM | POA: Diagnosis not present

## 2021-03-23 DIAGNOSIS — I825Z2 Chronic embolism and thrombosis of unspecified deep veins of left distal lower extremity: Secondary | ICD-10-CM

## 2021-03-23 DIAGNOSIS — K449 Diaphragmatic hernia without obstruction or gangrene: Secondary | ICD-10-CM | POA: Diagnosis not present

## 2021-03-23 DIAGNOSIS — C801 Malignant (primary) neoplasm, unspecified: Secondary | ICD-10-CM

## 2021-03-23 DIAGNOSIS — C779 Secondary and unspecified malignant neoplasm of lymph node, unspecified: Secondary | ICD-10-CM | POA: Insufficient documentation

## 2021-03-23 DIAGNOSIS — Z7189 Other specified counseling: Secondary | ICD-10-CM

## 2021-03-23 DIAGNOSIS — C774 Secondary and unspecified malignant neoplasm of inguinal and lower limb lymph nodes: Secondary | ICD-10-CM

## 2021-03-23 DIAGNOSIS — Z7901 Long term (current) use of anticoagulants: Secondary | ICD-10-CM | POA: Insufficient documentation

## 2021-03-23 DIAGNOSIS — E039 Hypothyroidism, unspecified: Secondary | ICD-10-CM

## 2021-03-23 LAB — CBC WITH DIFFERENTIAL/PLATELET
Abs Immature Granulocytes: 0.01 10*3/uL (ref 0.00–0.07)
Basophils Absolute: 0 10*3/uL (ref 0.0–0.1)
Basophils Relative: 1 %
Eosinophils Absolute: 0.1 10*3/uL (ref 0.0–0.5)
Eosinophils Relative: 4 %
HCT: 34.9 % — ABNORMAL LOW (ref 36.0–46.0)
Hemoglobin: 11.5 g/dL — ABNORMAL LOW (ref 12.0–15.0)
Immature Granulocytes: 0 %
Lymphocytes Relative: 26 %
Lymphs Abs: 0.9 10*3/uL (ref 0.7–4.0)
MCH: 28.8 pg (ref 26.0–34.0)
MCHC: 33 g/dL (ref 30.0–36.0)
MCV: 87.3 fL (ref 80.0–100.0)
Monocytes Absolute: 0.4 10*3/uL (ref 0.1–1.0)
Monocytes Relative: 12 %
Neutro Abs: 1.9 10*3/uL (ref 1.7–7.7)
Neutrophils Relative %: 57 %
Platelets: 210 10*3/uL (ref 150–400)
RBC: 4 MIL/uL (ref 3.87–5.11)
RDW: 14 % (ref 11.5–15.5)
WBC: 3.3 10*3/uL — ABNORMAL LOW (ref 4.0–10.5)
nRBC: 0 % (ref 0.0–0.2)

## 2021-03-23 LAB — COMPREHENSIVE METABOLIC PANEL
ALT: 7 U/L (ref 0–44)
AST: 11 U/L — ABNORMAL LOW (ref 15–41)
Albumin: 4.1 g/dL (ref 3.5–5.0)
Alkaline Phosphatase: 128 U/L — ABNORMAL HIGH (ref 38–126)
Anion gap: 6 (ref 5–15)
BUN: 14 mg/dL (ref 8–23)
CO2: 27 mmol/L (ref 22–32)
Calcium: 9.3 mg/dL (ref 8.9–10.3)
Chloride: 105 mmol/L (ref 98–111)
Creatinine, Ser: 0.69 mg/dL (ref 0.44–1.00)
GFR, Estimated: >60 mL/min (ref >60)
Glucose, Bld: 91 mg/dL (ref 70–99)
Potassium: 4.1 mmol/L (ref 3.5–5.1)
Sodium: 138 mmol/L (ref 135–145)
Total Bilirubin: 0.6 mg/dL (ref 0.3–1.2)
Total Protein: 7.5 g/dL (ref 6.5–8.1)

## 2021-03-23 LAB — TSH: TSH: 2.219 u[IU]/mL (ref 0.308–3.960)

## 2021-03-23 MED ORDER — SODIUM CHLORIDE 0.9 % IV SOLN: Freq: Once | INTRAVENOUS | Status: AC

## 2021-03-23 MED ORDER — HEPARIN SOD (PORK) LOCK FLUSH 100 UNIT/ML IV SOLN
500.0000 [IU] | Freq: Once | INTRAVENOUS | Status: AC | PRN
  Administered 2021-03-23: 500 [IU]

## 2021-03-23 MED ORDER — LIDOCAINE-PRILOCAINE 2.5-2.5 % EX CREA: 1.0000 "application " | TOPICAL_CREAM | Freq: Every day | CUTANEOUS | 3 refills | Status: AC | PRN

## 2021-03-23 MED ORDER — SODIUM CHLORIDE 0.9 % IV SOLN
200.0000 mg | Freq: Once | INTRAVENOUS | Status: AC
  Administered 2021-03-23: 200 mg via INTRAVENOUS
  Filled 2021-03-23: qty 8

## 2021-03-23 MED ORDER — SODIUM CHLORIDE 0.9% FLUSH
10.0000 mL | INTRAVENOUS | Status: DC | PRN
  Administered 2021-03-23: 10 mL

## 2021-03-23 MED ORDER — SODIUM CHLORIDE 0.9% FLUSH
10.0000 mL | Freq: Once | INTRAVENOUS | Status: AC
  Administered 2021-03-23: 10 mL

## 2021-03-23 NOTE — Assessment & Plan Note (Signed)
This is due to her treatment She is not symptomatic Observe 

## 2021-03-23 NOTE — Progress Notes (Signed)
[place] OFFICE PROGRESS NOTE  Patient Care Team: Nolene Ebbs, MD as PCP - General (Internal Medicine)  ASSESSMENT & PLAN:  Uterine cancer [facility]) Her last imaging studies showed no signs of disease She has attained complete response to treatment We discussed the role of long-term treatment with pembrolizumab and she is in agreement to proceed I will continue to scan her every 6 months, next imaging study will be done in July of this year  Pancytopenia, acquired [facility]) This is due to her treatment She is not symptomatic Observe  Lower leg DVT (deep venous thromboembolism), chronic, left ([place]) She is taking both anticoagulation therapy and antiplatelet agent She has no recent bleeding She will continue her medications as directed  She is debilitated with her mobility I gave her an order to get a new walker as replacement  No orders of the defined types were placed in this encounter.   All questions were answered. The patient knows to call the clinic with any problems, questions or concerns. The total time spent in the appointment was 20 minutes encounter with patients including review of chart and various tests results, discussions about plan of care and coordination of care plan   [name], MD 03/23/2021 10:41 AM  INTERVAL HISTORY: Please see below for problem oriented charting. she returns for treatment follow-up She tolerated treatment without major side effects Her chronic pain is stable She has no bleeding from anticoagulation therapy Her walker broke recently and she is using a replacement that does not work well for her  REVIEW OF SYSTEMS:   Constitutional: Denies fevers, chills or abnormal weight loss Eyes: Denies blurriness of vision Ears, nose, mouth, throat, and face: Denies mucositis or sore throat Respiratory: Denies cough, dyspnea or wheezes Cardiovascular: Denies palpitation, chest discomfort  Gastrointestinal:  Denies nausea, heartburn  or change in bowel habits Skin: Denies abnormal skin rashes Lymphatics: Denies new lymphadenopathy or easy bruising Neurological:Denies numbness, tingling or new weaknesses Behavioral/Psych: Mood is stable, no new changes  All other systems were reviewed with the patient and are negative.  I have reviewed the past medical history, past surgical history, social history and family history with the patient and they are unchanged from previous note.  ALLERGIES:  has No Known Allergies.  MEDICATIONS:  Current Outpatient Medications  Medication Sig Dispense Refill   apixaban (ELIQUIS) 2.5 MG TABS tablet Take by mouth 2 (two) times daily.     diclofenac sodium (VOLTAREN) 1 % GEL APPLY 4GRAMS 4 TIMES A DAY AS NEEDED FOR PAINS     gabapentin (NEURONTIN) 300 MG capsule Take 300 mg by mouth 3 (three) times daily.     lidocaine-prilocaine (EMLA) cream Apply 1 application topically daily as needed. 30 g 3   methadone (DOLOPHINE) 10 MG tablet Take 1 tablet (10 mg total) by mouth every 12 (twelve) hours. 60 tablet 0   morphine (MSIR) 15 MG tablet Take 1 tablet (15 mg total) by mouth every 6 (six) hours as needed for severe pain. 60 tablet 0   Olopatadine HCl 0.2 % SOLN Place 1 drop into both eyes daily.     No current facility-administered medications for this visit.    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 Uterus +/- tubes/ovaries, neoplastic, with left fallopian tube and ovary -  INVASIVE ENDOMETRIOID CARCINOMA (1.5 CM), FIGO GRADE II, ARISING IN A BACKGROUND OF ATYPICAL COMPLEX HYPERPLASIA, CONFINED WITHIN INNER HALF OF THE MYOMETRIUM. - ENDOMETRIAL POLYP WITH ASSOCIATED ATYPICAL COMPLEX HYPERPLASIA. - MYOMETRIUM: LEIOMYOMATA. - CERVIX: BENIGN SQUAMOUS MUCOSA AND ENDOCERVICAL  MUCOSA, NO DYSPLASIA OR MALIGNANCY. - LEFT OVARY: BENIGN OVARIAN TISSUE WITH ENDOSALPINGOSIS, NO EVIDENCE OF ATYPIA OR MALIGNANCY. - LEFT FALLOPIAN TUBE: NO HISTOLOGIC ABNORMALITIES. - PLEASE SEE ONCOLOGY TEMPLATE FOR DETAIL. 2. Ovary and fallopian tube, right - BENIGN OVARIAN TISSUE WITH ENDOSALPINGOSIS, NO ATYPIA OR MALIGNANCY. - BENIGN FALLOPIAN TUBAL TISSUE, NO PATHOLOGIC ABNORMALITIES. Microscopic Comment 1. UTERUS Specimen: Uterus, cervix, bilateral ovaries and fallopian tubes Procedure: Total hysterectomy and bilateral salpingo-oophorectomy Lymph node sampling performed: No Specimen integrity: Intact Maximum tumor size (cm): 1.5 cm, glass slide measurement Histologic type: Invasive endometrioid carcinoma Grade: FIGO grade II Myometrial invasion: 1 cm where myometrium is 2.3 cm in thickness Cervical stromal involvement: No Extent of involvement of other organs: No Lymph vascular invasion: Not identified Peritoneal washings: Negative ([id number]) Lymph nodes: number examined N/A; number positive N/A TNM code: pT1a, pNX 1 oFf 3IGO Stage (based on pathologic findings, needs clinical correlation): IA  Comments: Sections the endomyometrium away from the grossly identified endometrial polyp show an invasive FIGO grade II endometrioid carcinoma. The tumor is confined within inner half of the myometrium. No angiolymphatic invasion is identified. No cervical stromal involvement is identified. Sections of the grossly identified endometrial polyp show an endometrial polyp with associated atypical compacted hyperplasia with no definitive evidence of carcinoma.   12/07/2017 Imaging   US venous Doppler Right: No evidence of common femoral vein obstruction. Left: Findings consistent with acute deep vein thrombosis involving the left femoral vein, left proximal profunda vein, and left popliteal vein. Unable to adequately interrogate the common femoral and higher, or the calf secondary to  significant edema and body habitus   12/07/2017 [facility] Admission   She presented to the ER and was diagnosed with acute DVT   01/18/2018 - 01/21/2018 Hospital Admission   She was admitted to the hospital for management of severe persistent DVT   01/18/2018 Imaging   US venous Doppler Right: No evidence of common femoral vein obstruction. Left: Findings consistent with acute deep vein thrombosis involving the left common femoral vein, and left popliteal vein.   01/19/2018 Surgery   Pre-operative Diagnosis: Subacute DVT with severe post thrombotic syndrome Post-operative diagnosis:  Same Surgeon:  Erlene Quan C. Donzetta Matters, MD Procedure Performed: 1.  Ultrasound-guided cannulation left small saphenous vein 2.  Left lower extremity and central venography 3.  Intravascular ultrasound of left popliteal, femoral, common femoral, external and common iliac veins and IVC 4.  Stent of left common and external iliac veins with 14 x 60 mm Vici 5.  Moderate sedation with fentanyl and Versed for 50 minutes   Indications: 76 year old female with a history of DVT in October now presents with persistent left lower extremity swelling and ultrasound demonstrating likely persistent DVT.  She has been on Xarelto at this time.  She is now indicated for venogram possible intervention.   Findings: Flow in the left lower extremity was stagnant throughout but by venogram all veins were patent.  There was a focal occlusive area approximately 2 cm in length at the common and external iliac vein junction at the hypogastric on the left.  After stenting and ballooning we had a diameter of 12 millimeters in the stent and venogram demonstrated flow in the lower extremity veins were previously was stagnant and  no further residual stenosis in the left common and external iliac vein junction.   04/12/2018 Imaging   US Venous Doppler Right: No evidence of common femoral vein obstruction. Left: There is no evidence of deep vein  thrombosis in the lower extremity. However, portions of this examination were limited- see technologist comments above. Left groin: Large hypoechoic area with mixed echoes noted measuring nearly 10 cm. Possible  hematoma versus unknown etiology. Ultrasound characteristics of enlarged lymph nodes noted in the groin.      05/15/2018 Imaging   US Venous Doppler Right: No evidence of deep vein thrombosis in the lower extremity. No indirect evidence of obstruction proximal to the inguinal ligament. Left: No reflux was noted in the common femoral vein , femoral vein in the thigh, popliteal vein, great saphenous vein at the saphenofemoral junction, great saphenous vein at the proximal thigh, great saphenous vein at the mid thigh, great saphenous vein  at the distal thigh, great saphenous vein at the knee, origin of the small saphenous vein, proximal small saphenous vein, and mid small saphenous vein. There is no evidence of deep vein thrombosis in the lower extremity. There is no evidence of superficial venous thrombosis. No cystic structure found in the popliteal fossa. Unable to evaluate extension of common femoral vein obstruction proximal to the inguinal ligament.   06/01/2018 Imaging   1. 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 Additionally, there is lucency involving the medial sidewall of the left acetabulum with potential nondisplaced pathologic fracture. Further evaluation with contrast-enhanced pelvic MRI could be performed as clinically indicated. 2. The left pelvic arterial and venous system is encased by this infiltrative left pelvic sidewall mass however while difficult to ascertain, the left external iliac venous stent appears patent.   06/18/2018 Pathology Results   Lymph node for lymphoma, Left Inguinal - METASTATIC ADENOCARCINOMA, SEE COMMENT. Microscopic Comment Immunohistochemistry  is positive for cytokeratin 7, PAX8, ER, and PR. Cytokeratin 5/6,and p63 are negative. The immunoprofile along with the patient's history are consistent with a gynecologic primary.   06/18/2018 Surgery   Pre-op Diagnosis: INGUINAL LYMPHADENOPATHY, PELVIC MASS      Procedure(s): EXCISIONAL BIOPSY DEEP LEFT INGUINAL LYMPH NODE   Surgeon(s): Coralie Keens, MD      06/24/2018 Cancer Staging   Staging form: Corpus Uteri - Carcinoma and Carcinosarcoma, AJCC 8th Edition - Clinical: Stage IVB (cT1a, cN2, pM1) - Signed by [name], MD on 06/24/2018     Genetic Testing   Patient has genetic testing done for MMR on pathology from 06/18/2018. Results revealed patient has the following mutation(s): MMR: abnormal   06/29/2018 Procedure   Placement of a subcutaneous port device. Catheter tip at the SVC and right atrium junction.    Genetic Testing   Patient has genetic testing done for MSI on pathology from 06/18/2018. Results revealed patient has the following mutation(s): MSI: High   07/02/2018 PET scan   Previous hysterectomy, with asymmetric focus of hypermetabolic activity in the left vaginal cuff, suspicious for residual or recurrent carcinoma.   Large hypermetabolic soft tissue mass involving the left pelvic sidewall and acetabulum, consistent with metastatic disease.   No evidence metastatic disease within the abdomen, chest, or neck.   07/09/2018 Tumor Marker   Patient's tumor was tested for the following markers: CA-125 Results of the tumor marker test revealed 9   07/10/2018 - 08/24/2018 Chemotherapy   The patient had carboplatin and taxol x 3  cycles   07/17/2018 Genetic Testing   Negative genetic testing on the common hereditary cancer panel.  The Common Hereditary Gene Panel offered by [facility] includes sequencing and/or deletion duplication testing of the following 48 genes: APC, ATM, AXIN2, BARD1, BMPR1A, BRCA1, BRCA2, BRIP1, CDH1, CDK4, CDKN2A (p14ARF), CDKN2A (p16INK4a), CHEK2,  CTNNA1, DICER1, EPCAM (Deletion/duplication testing only), GREM1 (promoter region deletion/duplication testing only), KIT, MEN1, MLH1, MSH2, MSH3, MSH6, MUTYH, NBN, NF1, NHTL1, PALB2, PDGFRA, PMS2, POLD1, POLE, PTEN, RAD50, RAD51C, RAD51D, RNF43, SDHB, SDHC, SDHD, SMAD4, SMARCA4. STK11, TP53, TSC1, TSC2, and VHL.  The following genes were evaluated for sequence changes only: SDHA and HOXB13 c.251G>A variant only. The report date is Jul 17, 2018.    10/03/2018 Imaging   CT abdomen and pelvis 1.  No acute intra-abdominal process. 2. Grossly unchanged left pelvic sidewall mass with osseous involvement of the medial acetabulum. Progressive mild displacement of the associated comminuted pathologic fracture involving the right acetabulum and puboacetabular junction.  3. New venous stents extending from the left common iliac vein origin to the proximal left common femoral vein. The stents are patent.   11/06/2018 -  Chemotherapy   The patient had pembrolizumab for chemotherapy treatment.     01/28/2019 Imaging   1. No substantial interval change in exam. 2. Interval development of mild fullness in the left intrarenal collecting system and ureter without overt hydronephrosis at this time. 3. Abnormal soft tissue along the left pelvic sidewall has decreased slightly in the interval. 4. Similar appearance of ill-defined fascial planes in the pelvis with some peritoneal thickening along the right pelvic sidewall and potentially involving the sigmoid mesocolon. 5. No substantial ascites.   05/03/2019 Imaging   1. Stable mild left pelvic sidewall soft tissue density. No new or progressive disease identified within the abdomen or pelvis.  2. Colonic diverticulosis. No radiographic evidence of diverticulitis.   Aortic Atherosclerosis (ICD10-I70.0).   09/09/2019 Imaging   1. No change in appearance of soft tissue thickening along the LEFT pelvic sidewall adjacent to chronic LEFT acetabular fracture. 2. Mild  asymmetry of the bladder wall favoring the LEFT bladder wall, not well assessed. Similar accounting for variable degrees of distension on prior studies potentially related to prior radiation, attention on follow-up. 3. Signs of venous stenting in the LEFT hemipelvis with LEFT lower extremity muscular atrophy and mild stranding with similar appearance. Signs of colonic diverticulosis and diverticular disease without change.   02/24/2020 Imaging   1. Unchanged appearance of the pelvis as detailed below. 2. Unchanged soft tissue thickening of the left pelvic sidewall. 3. 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 4. No evidence discrete mass or lymphadenopathy nor metastatic disease in the abdomen or pelvis. 5. Status post hysterectomy and cholecystectomy. 6. Left common iliac vein stent. 7. Pancolonic diverticulosis.     09/07/2020 Imaging   Stable abnormal soft tissue density in the left pelvic sidewall. No new or progressive disease within the abdomen or pelvis.   Colonic diverticulosis. No radiographic evidence of diverticulitis.   Stable severe destructive left hip arthropathy with fracture involving the medial acetabular wall.     03/01/2021 Imaging   Stable abnormal soft tissue density in the left pelvic sidewall. 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   No new or progressive disease within the abdomen or pelvis.   Colonic diverticulosis, without radiographic evidence of diverticulitis.   Tiny hiatal hernia.  Metastasis to lymph nodes ([place])  06/23/2018 Initial Diagnosis   Metastasis to lymph nodes ([place])   07/10/2018 - 08/24/2018 Chemotherapy   The patient had palonosetron (ALOXI) injection 0.25 mg, 0.25 mg, Intravenous,  Once, 3 of 6 cycles Administration: 0.25 mg (07/10/2018), 0.25 mg (07/31/2018), 0.25 mg (08/24/2018) CARBOplatin (PARAPLATIN) 480 mg in sodium  chloride 0.9 % 250 mL chemo infusion, 480 mg (100 % of original dose 482.5 mg), Intravenous,  Once, 3 of 6 cycles Dose modification: 482.5 mg (original dose 482.5 mg, Cycle 1) Administration: 480 mg (07/10/2018), 480 mg (07/31/2018), 480 mg (08/24/2018) PACLitaxel (TAXOL) 276 mg in sodium chloride 0.9 % 250 mL chemo infusion (> 17m/m2), 140 mg/m2 = 276 mg (80 % of original dose 175 mg/m2), Intravenous,  Once, 3 of 6 cycles Dose modification: 140 mg/m2 (80 % of original dose 175 mg/m2, Cycle 1, Reason: Dose Not Tolerated) Administration: 276 mg (07/10/2018), 276 mg (07/31/2018), 276 mg (08/24/2018) fosaprepitant (EMEND) 150 mg, dexamethasone (DECADRON) 12 mg in sodium chloride 0.9 % 145 mL IVPB, , Intravenous,  Once, 3 of 6 cycles Administration:  (07/10/2018),  (07/31/2018),  (08/24/2018)   for chemotherapy treatment.     11/06/2018 -  Chemotherapy   The patient had pembrolizumab for chemotherapy treatment.     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 ([place]  06/24/2018 Initial Diagnosis   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   07/10/2018 - 08/24/2018 Chemotherapy   The patient had palonosetron (ALOXI) injection 0.25 mg, 0.25 mg, Intravenous,  Once, 3 of 6 cycles Administration: 0.25 mg (07/10/2018), 0.25 mg (07/31/2018), 0.25 mg (08/24/2018) CARBOplatin (PARAPLATIN) 480 mg in sodium chloride 0.9 % 250 mL chemo infusion, 480 mg (100 % of original dose 482.5 mg), Intravenous,  Once, 3 of 6 cycles Dose modification: 482.5 mg (original dose 482.5 mg, Cycle 1) Administration: 480 mg (07/10/2018), 480 mg (07/31/2018), 480 mg (08/24/2018) PACLitaxel (TAXOL) 276 mg in sodium chloride 0.9 % 250 mL chemo infusion (> 870mm2), 140 mg/m2 = 276 mg (80 % of original dose 175 mg/m2), Intravenous,  Once, 3 of 6 cycles Dose modification: 140 mg/m2 (80 % of original dose 175 mg/m2, Cycle 1, Reason: Dose Not Tolerated) Administration: 276 mg (07/10/2018), 276 mg (07/31/2018), 276 mg (08/24/2018) fosaprepitant (EMEND) 150 mg, dexamethasone (DECADRON) 12 mg in sodium  chloride 0.9 % 145 mL IVPB, , Intravenous,  Once, 3 of 6 cycles Administration:  (07/10/2018),  (07/31/2018),  (08/24/2018)   for chemotherapy treatment.     11/06/2018 -  Chemotherapy   The patient had pembrolizumab for chemotherapy treatment.     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       PHYSICAL EXAMINATION: ECOG PERFORMANCE STATUS: 2 - Symptomatic, <50% confined to bed  Vitals:   03/23/21 1027  BP: 125/71  Pulse: 94  Resp: 18  Temp: 97.8 F (36.6 C)  SpO2: 99%   Filed Weights   03/23/21 1027  Weight: 227 lb (103 kg)    GENERAL:alert, no distress and comfortable SKIN: skin color, texture, turgor are normal, no rashes or significant lesions EYES: normal, Conjunctiva are pink and non-injected, sclera clear OROPHARYNX:no exudate, no erythema and lips, buccal mucosa, and tongue normal  NECK: supple, thyroid normal size, non-tender, without nodularity LYMPH:  no palpable lymphadenopathy in the cervical, axillary or inguinal LUNGS: clear to auscultation and percussion with normal breathing  effort HEART: regular rate & rhythm and no murmurs with chronic left lower extremity edema ABDOMEN:abdomen soft, non-tender and normal bowel sounds Musculoskeletal:no cyanosis of digits and no clubbing  NEURO: alert & oriented x 3 with fluent speech, no focal motor/sensory deficits  LABORATORY DATA:  I have reviewed the data as listed    Component Value Date/Time   NA 137 02/28/2021 1017   K 3.9 02/28/2021 1017   CL 105 02/28/2021 1017   CO2 27 02/28/2021 1017   GLUCOSE 95 02/28/2021 1017   BUN 13 02/28/2021 1017   CREATININE 0.62 02/28/2021 1017   CREATININE 0.57 11/30/2020 1115   CALCIUM 9.1 02/28/2021 1017   PROT 7.4  02/28/2021 1017   ALBUMIN 3.9 02/28/2021 1017   AST 11 (L) 02/28/2021 1017   AST 13 (L) 11/30/2020 1115   ALT 7 02/28/2021 1017   ALT 9 11/30/2020 1115   ALKPHOS 122 02/28/2021 1017   BILITOT 0.5 02/28/2021 1017   BILITOT 0.8 11/30/2020 1115   GFRNONAA >60 02/28/2021 1017   GFRNONAA >60 11/30/2020 1115   GFRAA >60 11/12/2019 1222    No results found for: SPEP, UPEP  Lab Results  Component Value Date   WBC 3.3 (L) 03/23/2021   NEUTROABS 1.9 03/23/2021   HGB 11.5 (L) 03/23/2021   HCT 34.9 (L) 03/23/2021   MCV 87.3 03/23/2021   PLT 210 03/23/2021      Chemistry      Component Value Date/Time   NA 137 02/28/2021 1017   K 3.9 02/28/2021 1017   CL 105 02/28/2021 1017   CO2 27 02/28/2021 1017   BUN 13 02/28/2021 1017   CREATININE 0.62 02/28/2021 1017   CREATININE 0.57 11/30/2020 1115      Component Value Date/Time   CALCIUM 9.1 02/28/2021 1017   ALKPHOS 122 02/28/2021 1017   AST 11 (L) 02/28/2021 1017   AST 13 (L) 11/30/2020 1115   ALT 7 02/28/2021 1017   ALT 9 11/30/2020 1115   BILITOT 0.5 02/28/2021 1017   BILITOT 0.8 11/30/2020 1115       RADIOGRAPHIC STUDIES: I have personally reviewed the radiological images as listed and agreed with the findings in the report. CT ABDOMEN PELVIS W CONTRAST  Result Date: 02/28/2021 CLINICAL DATA:  Follow-up metastatic endometrial carcinoma. Ongoing chemotherapy. Previous radiation therapy. EXAM: CT ABDOMEN AND PELVIS WITH CONTRAST TECHNIQUE: Multidetector CT imaging of the abdomen and pelvis was performed using the standard protocol following bolus administration of intravenous contrast. CONTRAST:  93m OMNIPAQUE IOHEXOL 350 MG/ML SOLN COMPARISON:  09/06/2020 FINDINGS: Lower Chest: No acute findings. Hepatobiliary: No hepatic masses identified. Prior cholecystectomy. No evidence of biliary obstruction. Pancreas:  No mass or inflammatory changes. Spleen: Within normal limits in size and appearance. Adrenals/Urinary Tract: No masses  identified. No evidence of ureteral calculi or hydronephrosis. Stomach/Bowel: Tiny hiatal hernia. No evidence of obstruction, inflammatory process or abnormal fluid collections. Normal appendix visualized. Diffuse colonic diverticulosis again noted, without evidence of diverticulitis. Vascular/Lymphatic: No evidence of abdominal lymphadenopathy. Abnormal soft tissue density along the left pelvic sidewall and medial wall of the left acetabulum remains stable. Stent again seen within the left common and external iliac veins. No acute vascular findings. Reproductive:  No mass or other significant abnormality. Other:  None. Musculoskeletal: No suspicious bone lesions identified. Severe left hip arthropathy and chronic fracture of the medial left acetabulum remains stable in appearance. IMPRESSION: Stable abnormal soft tissue density in the left pelvic sidewall. Stable severe chronic left hip arthropathy and acetabular  fracture. No new or progressive disease within the abdomen or pelvis. Colonic diverticulosis, without radiographic evidence of diverticulitis. Tiny hiatal hernia. Electronically Signed   By: Marlaine Hind M.D.   On: 02/28/2021 14:42   VAS [place] IVC/ILIAC (VENOUS ONLY)  Result Date: 03/14/2021 IVC/ILIAC STUDY Patient Name:  LANETTA FIGUERO [facility]  Date of Exam:   03/14/2021 Medical Rec #: [id number]       Accession #:    [id number] Date of Birth: 1945-05-22        Patient Gender: F Patient Age:   43 years Exam Location:  [facility] Procedure:      VAS [place] IVC/ILIAC (VENOUS ONLY) Referring Phys: Servando Snare --------------------------------------------------------------------------------  Risk Factors: Hypertension. Vascular Interventions: 09/08/2018: Mechanical thrombectomy of left common iliac                         vein, external iliac vein, and common femoral vein.                         Stent of left external iliac vein and common femoral                         vein. Limitations: Air/bowel gas  and obesity.  Comparison Study: 03/13/2020: Patent external iliac vein stent. No evidence of                   thrombus in the IVC and iliac veins. Performing Technologist: Ivan Croft  Examination Guidelines: A complete evaluation includes B-mode imaging, spectral Doppler, color Doppler, and power Doppler as needed of all accessible portions of each vessel. Bilateral testing is considered an integral part of a complete examination. Limited examinations for reoccurring indications may be performed as noted.  IVC/Iliac Findings: +----------+------+--------+-------------------+     IVC     Patent Thrombus      Comments        +----------+------+--------+-------------------+  IVC Prox                   unable to visualize  +----------+------+--------+-------------------+  IVC Mid                    unable to visualize  +----------+------+--------+-------------------+  IVC Distal patent                               +----------+------+--------+-------------------+  +-------------------+---------+-----------+---------+-----------+--------+          CIV         RT-Patent RT-Thrombus LT-Patent LT-Thrombus Comments  +-------------------+---------+-----------+---------+-----------+--------+  Common Iliac Prox    patent                patent                         +-------------------+---------+-----------+---------+-----------+--------+  Common Iliac Mid     patent                patent                         +-------------------+---------+-----------+---------+-----------+--------+  Common Iliac Distal  patent                patent                         +-------------------+---------+-----------+---------+-----------+--------+  +-------------------------+---------+-----------+---------+-----------+--------+  EIV            RT-Patent RT-Thrombus LT-Patent LT-Thrombus Comments  +-------------------------+---------+-----------+---------+-----------+--------+  External Iliac Vein Prox   patent                 patent                         +-------------------------+---------+-----------+---------+-----------+--------+  External Iliac Vein Mid    patent                patent                         +-------------------------+---------+-----------+---------+-----------+--------+  External Iliac Vein        patent                patent                          Distal                                                                          +-------------------------+---------+-----------+---------+-----------+--------+   Summary: IVC/Iliac: No evidence of thrombus in IVC and Iliac veins. Patent left external iliac stent. Limited visualization of the abdominal vessels.  *See table(s) above for measurements and observations.  Electronically signed by Servando Snare MD on 03/14/2021 at 9:37:10 AM.    Final

## 2021-03-23 NOTE — Assessment & Plan Note (Addendum)
She is taking both anticoagulation therapy and antiplatelet agent She has no recent bleeding She will continue her medications as directed  She is debilitated with her mobility I gave her an order to get a new walker as replacement

## 2021-03-23 NOTE — Assessment & Plan Note (Signed)
Her last imaging studies showed no signs of disease She has attained complete response to treatment We discussed the role of long-term treatment with pembrolizumab and she is in agreement to proceed I will continue to scan her every 6 months, next imaging study will be done in July of this year

## 2021-04-13 ENCOUNTER — Inpatient Hospital Stay: Payer: Medicare Other

## 2021-04-13 ENCOUNTER — Encounter: Payer: Self-pay | Admitting: Hematology and Oncology

## 2021-04-13 ENCOUNTER — Other Ambulatory Visit: Payer: Self-pay

## 2021-04-13 ENCOUNTER — Inpatient Hospital Stay (HOSPITAL_BASED_OUTPATIENT_CLINIC_OR_DEPARTMENT_OTHER): Payer: Medicare Other | Admitting: Hematology and Oncology

## 2021-04-13 DIAGNOSIS — Z7189 Other specified counseling: Secondary | ICD-10-CM

## 2021-04-13 DIAGNOSIS — I825Z2 Chronic embolism and thrombosis of unspecified deep veins of left distal lower extremity: Secondary | ICD-10-CM

## 2021-04-13 DIAGNOSIS — C7951 Secondary malignant neoplasm of bone: Secondary | ICD-10-CM

## 2021-04-13 DIAGNOSIS — G893 Neoplasm related pain (acute) (chronic): Secondary | ICD-10-CM | POA: Diagnosis not present

## 2021-04-13 DIAGNOSIS — C774 Secondary and unspecified malignant neoplasm of inguinal and lower limb lymph nodes: Secondary | ICD-10-CM

## 2021-04-13 DIAGNOSIS — E039 Hypothyroidism, unspecified: Secondary | ICD-10-CM

## 2021-04-13 DIAGNOSIS — C541 Malignant neoplasm of endometrium: Secondary | ICD-10-CM | POA: Diagnosis not present

## 2021-04-13 DIAGNOSIS — C801 Malignant (primary) neoplasm, unspecified: Secondary | ICD-10-CM

## 2021-04-13 DIAGNOSIS — C55 Malignant neoplasm of uterus, part unspecified: Secondary | ICD-10-CM | POA: Diagnosis not present

## 2021-04-13 LAB — COMPREHENSIVE METABOLIC PANEL
ALT: 7 U/L (ref 0–44)
AST: 10 U/L — ABNORMAL LOW (ref 15–41)
Albumin: 3.9 g/dL (ref 3.5–5.0)
Alkaline Phosphatase: 117 U/L (ref 38–126)
Anion gap: 5 (ref 5–15)
BUN: 13 mg/dL (ref 8–23)
CO2: 27 mmol/L (ref 22–32)
Calcium: 9.2 mg/dL (ref 8.9–10.3)
Chloride: 106 mmol/L (ref 98–111)
Creatinine, Ser: 0.62 mg/dL (ref 0.44–1.00)
GFR, Estimated: >60 mL/min (ref >60)
Glucose, Bld: 89 mg/dL (ref 70–99)
Potassium: 3.8 mmol/L (ref 3.5–5.1)
Sodium: 138 mmol/L (ref 135–145)
Total Bilirubin: 0.6 mg/dL (ref 0.3–1.2)
Total Protein: 7.1 g/dL (ref 6.5–8.1)

## 2021-04-13 LAB — CBC WITH DIFFERENTIAL/PLATELET
Abs Immature Granulocytes: 0.01 10*3/uL (ref 0.00–0.07)
Basophils Absolute: 0 10*3/uL (ref 0.0–0.1)
Basophils Relative: 1 %
Eosinophils Absolute: 0.1 10*3/uL (ref 0.0–0.5)
Eosinophils Relative: 4 %
HCT: 33.9 % — ABNORMAL LOW (ref 36.0–46.0)
Hemoglobin: 11 g/dL — ABNORMAL LOW (ref 12.0–15.0)
Immature Granulocytes: 0 %
Lymphocytes Relative: 23 %
Lymphs Abs: 0.8 10*3/uL (ref 0.7–4.0)
MCH: 28.6 pg (ref 26.0–34.0)
MCHC: 32.4 g/dL (ref 30.0–36.0)
MCV: 88.3 fL (ref 80.0–100.0)
Monocytes Absolute: 0.4 10*3/uL (ref 0.1–1.0)
Monocytes Relative: 12 %
Neutro Abs: 2.1 10*3/uL (ref 1.7–7.7)
Neutrophils Relative %: 60 %
Platelets: 207 10*3/uL (ref 150–400)
RBC: 3.84 MIL/uL — ABNORMAL LOW (ref 3.87–5.11)
RDW: 14.1 % (ref 11.5–15.5)
WBC: 3.5 10*3/uL — ABNORMAL LOW (ref 4.0–10.5)
nRBC: 0 % (ref 0.0–0.2)

## 2021-04-13 LAB — TSH: TSH: 1.678 u[IU]/mL (ref 0.308–3.960)

## 2021-04-13 MED ORDER — SODIUM CHLORIDE 0.9% FLUSH
10.0000 mL | Freq: Once | INTRAVENOUS | Status: AC
  Administered 2021-04-13: 10 mL

## 2021-04-13 MED ORDER — SODIUM CHLORIDE 0.9% FLUSH
10.0000 mL | INTRAVENOUS | Status: DC | PRN
  Administered 2021-04-13: 10 mL

## 2021-04-13 MED ORDER — SODIUM CHLORIDE 0.9 % IV SOLN: Freq: Once | INTRAVENOUS | Status: AC

## 2021-04-13 MED ORDER — SODIUM CHLORIDE 0.9 % IV SOLN
200.0000 mg | Freq: Once | INTRAVENOUS | Status: AC
  Administered 2021-04-13: 200 mg via INTRAVENOUS
  Filled 2021-04-13: qty 200

## 2021-04-13 MED ORDER — HEPARIN SOD (PORK) LOCK FLUSH 100 UNIT/ML IV SOLN
500.0000 [IU] | Freq: Once | INTRAVENOUS | Status: AC | PRN
  Administered 2021-04-13: 500 [IU]

## 2021-04-13 NOTE — Assessment & Plan Note (Signed)
She is taking both anticoagulation therapy and antiplatelet agent She has no recent bleeding She will continue her medications as directed  

## 2021-04-13 NOTE — Progress Notes (Signed)
[place] OFFICE PROGRESS NOTE  Patient Care Team: Nolene Ebbs, MD as PCP - General (Internal Medicine)  ASSESSMENT & PLAN:  Uterine cancer [facility]) Her last imaging studies showed no signs of disease She has attained complete response to treatment We discussed the role of long-term treatment with pembrolizumab and she is in agreement to proceed I will continue to scan her every 6 months, next imaging study will be done in July of this year  Lower leg DVT (deep venous thromboembolism), chronic, left ([place]) She is taking both anticoagulation therapy and antiplatelet agent She has no recent bleeding She will continue her medications as directed   Cancer associated pain Her cancer associated pain is due to left hip fracture and related to her previous treatment She will continue her prescribed pain medicine  No orders of the defined types were placed in this encounter.   All questions were answered. The patient knows to call the clinic with any problems, questions or concerns. The total time spent in the appointment was 20 minutes encounter with patients including review of chart and various tests results, discussions about plan of care and coordination of care plan   [name], MD 04/13/2021 11:14 AM  INTERVAL HISTORY: Please see below for problem oriented charting. she returns for treatment follow-up on single agent Keytruda for recurrent uterine cancer She is doing well She has no side effects from treatment No recent bleeding Her chronic pain is stable  REVIEW OF SYSTEMS:   Constitutional: Denies fevers, chills or abnormal weight loss Eyes: Denies blurriness of vision Ears, nose, mouth, throat, and face: Denies mucositis or sore throat Respiratory: Denies cough, dyspnea or wheezes Cardiovascular: Denies palpitation, chest discomfort or lower extremity swelling Gastrointestinal:  Denies nausea, heartburn or change in bowel habits Skin: Denies abnormal  skin rashes Lymphatics: Denies new lymphadenopathy or easy bruising Neurological:Denies numbness, tingling or new weaknesses Behavioral/Psych: Mood is stable, no new changes  All other systems were reviewed with the patient and are negative.  I have reviewed the past medical history, past surgical history, social history and family history with the patient and they are unchanged from previous note.  ALLERGIES:  has No Known Allergies.  MEDICATIONS:  Current Outpatient Medications  Medication Sig Dispense Refill   apixaban (ELIQUIS) 2.5 MG TABS tablet Take by mouth 2 (two) times daily.     diclofenac sodium (VOLTAREN) 1 % GEL APPLY 4GRAMS 4 TIMES A DAY AS NEEDED FOR PAINS     gabapentin (NEURONTIN) 300 MG capsule Take 300 mg by mouth 3 (three) times daily.     lidocaine-prilocaine (EMLA) cream Apply 1 application topically daily as needed. 30 g 3   methadone (DOLOPHINE) 10 MG tablet Take 1 tablet (10 mg total) by mouth every 12 (twelve) hours. 60 tablet 0   morphine (MSIR) 15 MG tablet Take 1 tablet (15 mg total) by mouth every 6 (six) hours as needed for severe pain. 60 tablet 0   Olopatadine HCl 0.2 % SOLN Place 1 drop into both eyes daily.     No current facility-administered medications for this visit.    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 Uterus +/- tubes/ovaries, neoplastic, with left fallopian tube and ovary - INVASIVE ENDOMETRIOID CARCINOMA (1.5 CM), FIGO GRADE II, ARISING IN A BACKGROUND  OF ATYPICAL COMPLEX HYPERPLASIA, CONFINED WITHIN INNER HALF OF THE MYOMETRIUM. - ENDOMETRIAL POLYP WITH ASSOCIATED ATYPICAL COMPLEX HYPERPLASIA. - MYOMETRIUM: LEIOMYOMATA. - CERVIX: BENIGN SQUAMOUS MUCOSA AND ENDOCERVICAL MUCOSA, NO DYSPLASIA OR MALIGNANCY. - LEFT OVARY:  BENIGN OVARIAN TISSUE WITH ENDOSALPINGOSIS, NO EVIDENCE OF ATYPIA OR MALIGNANCY. - LEFT FALLOPIAN TUBE: NO HISTOLOGIC ABNORMALITIES. - PLEASE SEE ONCOLOGY TEMPLATE FOR DETAIL. 2. Ovary and fallopian tube, right - BENIGN OVARIAN TISSUE WITH ENDOSALPINGOSIS, NO ATYPIA OR MALIGNANCY. - BENIGN FALLOPIAN TUBAL TISSUE, NO PATHOLOGIC ABNORMALITIES. Microscopic Comment 1. UTERUS Specimen: Uterus, cervix, bilateral ovaries and fallopian tubes Procedure: Total hysterectomy and bilateral salpingo-oophorectomy Lymph node sampling performed: No Specimen integrity: Intact Maximum tumor size (cm): 1.5 cm, glass slide measurement Histologic type: Invasive endometrioid carcinoma Grade: FIGO grade II Myometrial invasion: 1 cm where myometrium is 2.3 cm in thickness Cervical stromal involvement: No Extent of involvement of other organs: No Lymph vascular invasion: Not identified Peritoneal washings: Negative ([id number]) Lymph nodes: number examined N/A; number positive N/A TNM code: pT1a, pNX 1 oFf 3IGO Stage (based on pathologic findings, needs clinical correlation): IA  Comments: Sections the endomyometrium away from the grossly identified endometrial polyp show an invasive FIGO grade II endometrioid carcinoma. The tumor is confined within inner half of the myometrium. No angiolymphatic invasion is identified. No cervical stromal involvement is identified. Sections of the grossly identified endometrial polyp show an endometrial polyp with associated atypical compacted hyperplasia with no definitive evidence of carcinoma.   12/07/2017 Imaging   US venous Doppler Right: No evidence of common femoral vein obstruction. Left: Findings consistent with acute deep vein thrombosis involving the left femoral vein, left proximal profunda vein, and left popliteal vein. Unable to adequately interrogate the common femoral and higher, or the calf secondary to significant edema and body habitus   12/07/2017 [facility] Admission   She presented to the ER and was diagnosed with acute DVT   01/18/2018 - 01/21/2018 Hospital Admission   She was admitted to the hospital for management of severe persistent DVT   01/18/2018 Imaging   US venous Doppler Right: No evidence of common femoral vein obstruction. Left: Findings consistent with acute deep vein thrombosis involving the left common femoral vein, and left popliteal vein.   01/19/2018 Surgery   Pre-operative Diagnosis: Subacute DVT with severe post thrombotic syndrome Post-operative diagnosis:  Same Surgeon:  Erlene Quan C. Donzetta Matters, MD Procedure Performed: 1.  Ultrasound-guided cannulation left small saphenous vein 2.  Left lower extremity and central venography 3.  Intravascular ultrasound of left popliteal, femoral, common femoral, external and common iliac veins and IVC 4.  Stent of left common and external iliac veins with 14 x 60 mm Vici 5.  Moderate sedation with fentanyl and Versed for 50 minutes   Indications: 76 year old female with a history of DVT in October now presents with persistent left lower extremity swelling and ultrasound demonstrating likely persistent DVT.  She has been on Xarelto at this time.  She is now indicated for venogram possible intervention.   Findings: Flow in the left lower extremity was stagnant throughout but by venogram all veins were patent.  There was a focal occlusive area approximately 2 cm in length at the common and external iliac vein junction at the hypogastric on the left.  After stenting and ballooning we had a diameter of 12 millimeters in the stent and venogram demonstrated flow in the lower extremity veins were previously was stagnant and no further residual stenosis in the left common and external iliac vein  junction.   04/12/2018 Imaging   US Venous Doppler Right: No evidence of common femoral vein obstruction. Left: There is no evidence of deep vein thrombosis in the lower extremity. However, portions of  this examination were limited- see technologist comments above. Left groin: Large hypoechoic area with mixed echoes noted measuring nearly 10 cm. Possible  hematoma versus unknown etiology. Ultrasound characteristics of enlarged lymph nodes noted in the groin.      05/15/2018 Imaging   US Venous Doppler Right: No evidence of deep vein thrombosis in the lower extremity. No indirect evidence of obstruction proximal to the inguinal ligament. Left: No reflux was noted in the common femoral vein , femoral vein in the thigh, popliteal vein, great saphenous vein at the saphenofemoral junction, great saphenous vein at the proximal thigh, great saphenous vein at the mid thigh, great saphenous vein  at the distal thigh, great saphenous vein at the knee, origin of the small saphenous vein, proximal small saphenous vein, and mid small saphenous vein. There is no evidence of deep vein thrombosis in the lower extremity. There is no evidence of superficial venous thrombosis. No cystic structure found in the popliteal fossa. Unable to evaluate extension of common femoral vein obstruction proximal to the inguinal ligament.   06/01/2018 Imaging   1. 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 Additionally, there is lucency involving the medial sidewall of the left acetabulum with potential nondisplaced pathologic fracture. Further evaluation with contrast-enhanced pelvic MRI could be performed as clinically indicated. 2. The left pelvic arterial and venous system is encased by this infiltrative left pelvic sidewall mass however while difficult to ascertain, the left external iliac venous stent appears patent.   06/18/2018 Pathology Results   Lymph node for lymphoma, Left Inguinal - METASTATIC ADENOCARCINOMA, SEE COMMENT. Microscopic Comment Immunohistochemistry is positive for cytokeratin 7, PAX8, ER, and PR.  Cytokeratin 5/6,and p63 are negative. The immunoprofile along with the patient's history are consistent with a gynecologic primary.   06/18/2018 Surgery   Pre-op Diagnosis: INGUINAL LYMPHADENOPATHY, PELVIC MASS      Procedure(s): EXCISIONAL BIOPSY DEEP LEFT INGUINAL LYMPH NODE   Surgeon(s): Coralie Keens, MD      06/24/2018 Cancer Staging   Staging form: Corpus Uteri - Carcinoma and Carcinosarcoma, AJCC 8th Edition - Clinical: Stage IVB (cT1a, cN2, pM1) - Signed by [name], MD on 06/24/2018     Genetic Testing   Patient has genetic testing done for MMR on pathology from 06/18/2018. Results revealed patient has the following mutation(s): MMR: abnormal   06/29/2018 Procedure   Placement of a subcutaneous port device. Catheter tip at the SVC and right atrium junction.    Genetic Testing   Patient has genetic testing done for MSI on pathology from 06/18/2018. Results revealed patient has the following mutation(s): MSI: High   07/02/2018 PET scan   Previous hysterectomy, with asymmetric focus of hypermetabolic activity in the left vaginal cuff, suspicious for residual or recurrent carcinoma.   Large hypermetabolic soft tissue mass involving the left pelvic sidewall and acetabulum, consistent with metastatic disease.   No evidence metastatic disease within the abdomen, chest, or neck.   07/09/2018 Tumor Marker   Patient's tumor was tested for the following markers: CA-125 Results of the tumor marker test revealed 9   07/10/2018 - 08/24/2018 Chemotherapy   The patient had carboplatin and taxol x 3 cycles   07/17/2018 Genetic Testing   Negative genetic testing on  the common hereditary cancer panel.  The Common Hereditary Gene Panel offered by [facility] includes sequencing and/or deletion duplication testing of the following 48 genes: APC, ATM, AXIN2, BARD1, BMPR1A, BRCA1, BRCA2, BRIP1, CDH1, CDK4, CDKN2A (p14ARF), CDKN2A (p16INK4a), CHEK2, CTNNA1, DICER1, EPCAM (Deletion/duplication  testing only), GREM1 (promoter region deletion/duplication testing only), KIT, MEN1, MLH1, MSH2, MSH3, MSH6, MUTYH, NBN, NF1, NHTL1, PALB2, PDGFRA, PMS2, POLD1, POLE, PTEN, RAD50, RAD51C, RAD51D, RNF43, SDHB, SDHC, SDHD, SMAD4, SMARCA4. STK11, TP53, TSC1, TSC2, and VHL.  The following genes were evaluated for sequence changes only: SDHA and HOXB13 c.251G>A variant only. The report date is Jul 17, 2018.    10/03/2018 Imaging   CT abdomen and pelvis 1.  No acute intra-abdominal process. 2. Grossly unchanged left pelvic sidewall mass with osseous involvement of the medial acetabulum. Progressive mild displacement of the associated comminuted pathologic fracture involving the right acetabulum and puboacetabular junction.  3. New venous stents extending from the left common iliac vein origin to the proximal left common femoral vein. The stents are patent.   11/06/2018 -  Chemotherapy   The patient had pembrolizumab for chemotherapy treatment.     01/28/2019 Imaging   1. No substantial interval change in exam. 2. Interval development of mild fullness in the left intrarenal collecting system and ureter without overt hydronephrosis at this time. 3. Abnormal soft tissue along the left pelvic sidewall has decreased slightly in the interval. 4. Similar appearance of ill-defined fascial planes in the pelvis with some peritoneal thickening along the right pelvic sidewall and potentially involving the sigmoid mesocolon. 5. No substantial ascites.   05/03/2019 Imaging   1. Stable mild left pelvic sidewall soft tissue density. No new or progressive disease identified within the abdomen or pelvis.  2. Colonic diverticulosis. No radiographic evidence of diverticulitis.   Aortic Atherosclerosis (ICD10-I70.0).   09/09/2019 Imaging   1. No change in appearance of soft tissue thickening along the LEFT pelvic sidewall adjacent to chronic LEFT acetabular fracture. 2. Mild asymmetry of the bladder wall favoring the  LEFT bladder wall, not well assessed. Similar accounting for variable degrees of distension on prior studies potentially related to prior radiation, attention on follow-up. 3. Signs of venous stenting in the LEFT hemipelvis with LEFT lower extremity muscular atrophy and mild stranding with similar appearance. Signs of colonic diverticulosis and diverticular disease without change.   02/24/2020 Imaging   1. Unchanged appearance of the pelvis as detailed below. 2. Unchanged soft tissue thickening of the left pelvic sidewall. 3. 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 4. No evidence discrete mass or lymphadenopathy nor metastatic disease in the abdomen or pelvis. 5. Status post hysterectomy and cholecystectomy. 6. Left common iliac vein stent. 7. Pancolonic diverticulosis.     09/07/2020 Imaging   Stable abnormal soft tissue density in the left pelvic sidewall. No new or progressive disease within the abdomen or pelvis.   Colonic diverticulosis. No radiographic evidence of diverticulitis.   Stable severe destructive left hip arthropathy with fracture involving the medial acetabular wall.     03/01/2021 Imaging   Stable abnormal soft tissue density in the left pelvic sidewall. 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   No new or progressive disease within the abdomen or pelvis.   Colonic diverticulosis, without radiographic evidence of diverticulitis.   Tiny hiatal hernia.   Metastasis to lymph nodes ([place])  06/23/2018 Initial Diagnosis   Metastasis  to lymph nodes ([place])   07/10/2018 - 08/24/2018 Chemotherapy   The patient had palonosetron (ALOXI) injection 0.25 mg, 0.25 mg, Intravenous,  Once, 3 of 6 cycles Administration: 0.25 mg (07/10/2018), 0.25 mg (07/31/2018), 0.25 mg (08/24/2018) CARBOplatin (PARAPLATIN) 480 mg in sodium chloride 0.9 % 250 mL chemo infusion, 480 mg  (100 % of original dose 482.5 mg), Intravenous,  Once, 3 of 6 cycles Dose modification: 482.5 mg (original dose 482.5 mg, Cycle 1) Administration: 480 mg (07/10/2018), 480 mg (07/31/2018), 480 mg (08/24/2018) PACLitaxel (TAXOL) 276 mg in sodium chloride 0.9 % 250 mL chemo infusion (> 76m/m2), 140 mg/m2 = 276 mg (80 % of original dose 175 mg/m2), Intravenous,  Once, 3 of 6 cycles Dose modification: 140 mg/m2 (80 % of original dose 175 mg/m2, Cycle 1, Reason: Dose Not Tolerated) Administration: 276 mg (07/10/2018), 276 mg (07/31/2018), 276 mg (08/24/2018) fosaprepitant (EMEND) 150 mg, dexamethasone (DECADRON) 12 mg in sodium chloride 0.9 % 145 mL IVPB, , Intravenous,  Once, 3 of 6 cycles Administration:  (07/10/2018),  (07/31/2018),  (08/24/2018)   for chemotherapy treatment.     11/06/2018 -  Chemotherapy   The patient had pembrolizumab for chemotherapy treatment.     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 ([place]  06/24/2018 Initial Diagnosis   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 ([place]   07/10/2018 - 08/24/2018 Chemotherapy   The patient had palonosetron (ALOXI) injection 0.25 mg, 0.25 mg, Intravenous,  Once, 3 of 6 cycles Administration: 0.25 mg (07/10/2018), 0.25 mg (07/31/2018), 0.25 mg (08/24/2018) CARBOplatin (PARAPLATIN) 480 mg in sodium chloride 0.9 % 250 mL chemo infusion, 480 mg (100 % of original dose 482.5 mg), Intravenous,  Once, 3 of 6 cycles Dose modification: 482.5 mg (original dose 482.5 mg, Cycle 1) Administration: 480 mg (07/10/2018), 480 mg (07/31/2018), 480 mg (08/24/2018) PACLitaxel (TAXOL) 276 mg in sodium chloride 0.9 % 250 mL chemo infusion (> 842mm2), 140 mg/m2 = 276 mg (80 % of original dose 175 mg/m2), Intravenous,  Once, 3 of 6 cycles Dose modification: 140 mg/m2 (80 % of original dose 175 mg/m2, Cycle 1, Reason: Dose Not Tolerated) Administration: 276 mg (07/10/2018), 276 mg (07/31/2018), 276 mg (08/24/2018) fosaprepitant (EMEND) 150 mg, dexamethasone (DECADRON) 12 mg in sodium chloride 0.9 % 145 mL IVPB, , Intravenous,   Once, 3 of 6 cycles Administration:  (07/10/2018),  (07/31/2018),  (08/24/2018)   for chemotherapy treatment.     11/06/2018 -  Chemotherapy   The patient had pembrolizumab for chemotherapy treatment.     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       PHYSICAL EXAMINATION: ECOG PERFORMANCE STATUS: 2 - Symptomatic, <50% confined to bed  Vitals:   04/13/21 1109  BP: (!) 141/69  Pulse: 73  Resp: 18  Temp: 98.3 F (36.8 C)  SpO2: 100%   Filed Weights   04/13/21 1109  Weight: 228 lb 3.2 oz (103.5 kg)    GENERAL:alert, no distress and comfortable SKIN: skin color, texture, turgor are normal, no rashes or significant lesions EYES: normal, Conjunctiva are pink and non-injected, sclera clear OROPHARYNX:no exudate, no erythema and lips, buccal mucosa, and tongue normal  NECK: supple, thyroid normal size, non-tender, without nodularity LYMPH:  no palpable lymphadenopathy in the cervical, axillary or inguinal LUNGS: clear to auscultation and percussion with normal breathing effort HEART: regular rate & rhythm and no murmurs  and no lower extremity edema ABDOMEN:abdomen soft, non-tender and normal bowel sounds Musculoskeletal:no cyanosis of digits and no clubbing  NEURO: alert & oriented x 3 with fluent speech, no focal motor/sensory deficits  LABORATORY DATA:  I have reviewed the data as listed    Component Value Date/Time   NA 138 03/23/2021 0956   K 4.1 03/23/2021 0956   CL 105 03/23/2021 0956   CO2 27 03/23/2021 0956   GLUCOSE 91 03/23/2021 0956   BUN 14 03/23/2021 0956   CREATININE 0.69 03/23/2021 0956   CREATININE 0.57 11/30/2020 1115   CALCIUM 9.3 03/23/2021 0956   PROT 7.5 03/23/2021 0956   ALBUMIN 4.1 03/23/2021 0956    AST 11 (L) 03/23/2021 0956   AST 13 (L) 11/30/2020 1115   ALT 7 03/23/2021 0956   ALT 9 11/30/2020 1115   ALKPHOS 128 (H) 03/23/2021 0956   BILITOT 0.6 03/23/2021 0956   BILITOT 0.8 11/30/2020 1115   GFRNONAA >60 03/23/2021 0956   GFRNONAA >60 11/30/2020 1115   GFRAA >60 11/12/2019 1222    No results found for: SPEP, UPEP  Lab Results  Component Value Date   WBC 3.5 (L) 04/13/2021   NEUTROABS 2.1 04/13/2021   HGB 11.0 (L) 04/13/2021   HCT 33.9 (L) 04/13/2021   MCV 88.3 04/13/2021   PLT 207 04/13/2021      Chemistry      Component Value Date/Time   NA 138 03/23/2021 0956   K 4.1 03/23/2021 0956   CL 105 03/23/2021 0956   CO2 27 03/23/2021 0956   BUN 14 03/23/2021 0956   CREATININE 0.69 03/23/2021 0956   CREATININE 0.57 11/30/2020 1115      Component Value Date/Time   CALCIUM 9.3 03/23/2021 0956   ALKPHOS 128 (H) 03/23/2021 0956   AST 11 (L) 03/23/2021 0956   AST 13 (L) 11/30/2020 1115   ALT 7 03/23/2021 0956   ALT 9 11/30/2020 1115   BILITOT 0.6 03/23/2021 0956   BILITOT 0.8 11/30/2020 1115

## 2021-04-13 NOTE — Assessment & Plan Note (Signed)
Her cancer associated pain is due to left hip fracture and related to her previous treatment She will continue her prescribed pain medicine 

## 2021-04-13 NOTE — Assessment & Plan Note (Signed)
Her last imaging studies showed no signs of disease She has attained complete response to treatment We discussed the role of long-term treatment with pembrolizumab and she is in agreement to proceed I will continue to scan her every 6 months, next imaging study will be done in July of this year

## 2021-04-13 NOTE — Patient Instructions (Signed)
[facility]  Discharge Instructions: °Thank you for choosing [facility] to provide your oncology and hematology care.  ° °If you have a lab appointment with the Cancer Center, please go directly to the Cancer Center and check in at the registration area. °  °Wear comfortable clothing and clothing appropriate for easy access to any Portacath or PICC line.  ° °We strive to give you quality time with your provider. You may need to reschedule your appointment if you arrive late (15 or more minutes).  Arriving late affects you and other patients whose appointments are after yours.  Also, if you miss three or more appointments without notifying the office, you may be dismissed from the clinic at the provider’s discretion.    °  °For prescription refill requests, have your pharmacy contact our office and allow 72 hours for refills to be completed.   ° °Today you received the following chemotherapy and/or immunotherapy agent: Pembrolizumab (Keytruda) °  °To help prevent nausea and vomiting after your treatment, we encourage you to take your nausea medication as directed. ° °BELOW ARE SYMPTOMS THAT SHOULD BE REPORTED IMMEDIATELY: °*FEVER GREATER THAN 100.4 F (38 °C) OR HIGHER °*CHILLS OR SWEATING °*NAUSEA AND VOMITING THAT IS NOT CONTROLLED WITH YOUR NAUSEA MEDICATION °*UNUSUAL SHORTNESS OF BREATH °*UNUSUAL BRUISING OR BLEEDING °*URINARY PROBLEMS (pain or burning when urinating, or frequent urination) °*BOWEL PROBLEMS (unusual diarrhea, constipation, pain near the anus) °TENDERNESS IN MOUTH AND THROAT WITH OR WITHOUT PRESENCE OF ULCERS (sore throat, sores in mouth, or a toothache) °UNUSUAL RASH, SWELLING OR PAIN  °UNUSUAL VAGINAL DISCHARGE OR ITCHING  ° °Items with * indicate a potential emergency and should be followed up as soon as possible or go to the Emergency Department if any problems should occur. ° °Please show the CHEMOTHERAPY ALERT CARD or IMMUNOTHERAPY ALERT CARD at  check-in to the Emergency Department and triage nurse. ° °Should you have questions after your visit or need to cancel or reschedule your appointment, please contact [facility]  Dept: [phone]  and follow the prompts.  Office hours are 8:00 a.m. to 4:30 p.m. Monday - Friday. Please note that voicemails left after 4:00 p.m. may not be returned until the following business day.  We are closed weekends and major holidays. You have access to a nurse at all times for urgent questions. Please call the main number to the clinic Dept: [phone] and follow the prompts. ° ° °For any non-urgent questions, you may also contact your provider using MyChart. We now offer e-Visits for anyone 18 and older to request care online for non-urgent symptoms. For details visit [email or web address]. °  °Also download the MyChart app! Go to the app store, search "MyChart", open the app, select [facility], and log in with your MyChart username and password. ° °Due to Covid, a mask is required upon entering the hospital/clinic. If you do not have a mask, one will be given to you upon arrival. For doctor visits, patients may have 1 support person aged 18 or older with them. For treatment visits, patients cannot have anyone with them due to current Covid guidelines and our immunocompromised population.  ° °

## 2021-04-23 ENCOUNTER — Other Ambulatory Visit: Payer: Self-pay | Admitting: Family Medicine

## 2021-04-23 DIAGNOSIS — S32402S Unspecified fracture of left acetabulum, sequela: Secondary | ICD-10-CM

## 2021-05-04 ENCOUNTER — Encounter: Payer: Self-pay | Admitting: Hematology and Oncology

## 2021-05-04 ENCOUNTER — Inpatient Hospital Stay: Payer: Medicare Other

## 2021-05-04 ENCOUNTER — Other Ambulatory Visit: Payer: Self-pay

## 2021-05-04 ENCOUNTER — Inpatient Hospital Stay: Payer: Medicare Other | Attending: Hematology and Oncology

## 2021-05-04 ENCOUNTER — Inpatient Hospital Stay (HOSPITAL_BASED_OUTPATIENT_CLINIC_OR_DEPARTMENT_OTHER): Payer: Medicare Other | Admitting: Hematology and Oncology

## 2021-05-04 DIAGNOSIS — Z7189 Other specified counseling: Secondary | ICD-10-CM

## 2021-05-04 DIAGNOSIS — Z79899 Other long term (current) drug therapy: Secondary | ICD-10-CM | POA: Diagnosis not present

## 2021-05-04 DIAGNOSIS — I825Z2 Chronic embolism and thrombosis of unspecified deep veins of left distal lower extremity: Secondary | ICD-10-CM

## 2021-05-04 DIAGNOSIS — C55 Malignant neoplasm of uterus, part unspecified: Secondary | ICD-10-CM | POA: Diagnosis not present

## 2021-05-04 DIAGNOSIS — I700 Atherosclerosis of aorta: Secondary | ICD-10-CM | POA: Insufficient documentation

## 2021-05-04 DIAGNOSIS — E039 Hypothyroidism, unspecified: Secondary | ICD-10-CM

## 2021-05-04 DIAGNOSIS — C7951 Secondary malignant neoplasm of bone: Secondary | ICD-10-CM

## 2021-05-04 DIAGNOSIS — G893 Neoplasm related pain (acute) (chronic): Secondary | ICD-10-CM | POA: Diagnosis not present

## 2021-05-04 DIAGNOSIS — C774 Secondary and unspecified malignant neoplasm of inguinal and lower limb lymph nodes: Secondary | ICD-10-CM

## 2021-05-04 DIAGNOSIS — K5730 Diverticulosis of large intestine without perforation or abscess without bleeding: Secondary | ICD-10-CM | POA: Insufficient documentation

## 2021-05-04 DIAGNOSIS — Z9221 Personal history of antineoplastic chemotherapy: Secondary | ICD-10-CM | POA: Insufficient documentation

## 2021-05-04 DIAGNOSIS — Z7901 Long term (current) use of anticoagulants: Secondary | ICD-10-CM | POA: Insufficient documentation

## 2021-05-04 DIAGNOSIS — C801 Malignant (primary) neoplasm, unspecified: Secondary | ICD-10-CM

## 2021-05-04 DIAGNOSIS — Z8542 Personal history of malignant neoplasm of other parts of uterus: Secondary | ICD-10-CM | POA: Diagnosis present

## 2021-05-04 DIAGNOSIS — K449 Diaphragmatic hernia without obstruction or gangrene: Secondary | ICD-10-CM | POA: Insufficient documentation

## 2021-05-04 LAB — COMPREHENSIVE METABOLIC PANEL
ALT: 6 U/L (ref 0–44)
AST: 10 U/L — ABNORMAL LOW (ref 15–41)
Albumin: 3.9 g/dL (ref 3.5–5.0)
Alkaline Phosphatase: 115 U/L (ref 38–126)
Anion gap: 5 (ref 5–15)
BUN: 12 mg/dL (ref 8–23)
CO2: 27 mmol/L (ref 22–32)
Calcium: 9.2 mg/dL (ref 8.9–10.3)
Chloride: 107 mmol/L (ref 98–111)
Creatinine, Ser: 0.62 mg/dL (ref 0.44–1.00)
GFR, Estimated: >60 mL/min (ref >60)
Glucose, Bld: 96 mg/dL (ref 70–99)
Potassium: 3.6 mmol/L (ref 3.5–5.1)
Sodium: 139 mmol/L (ref 135–145)
Total Bilirubin: 0.6 mg/dL (ref 0.3–1.2)
Total Protein: 7.3 g/dL (ref 6.5–8.1)

## 2021-05-04 LAB — CBC WITH DIFFERENTIAL/PLATELET
Abs Immature Granulocytes: 0.01 10*3/uL (ref 0.00–0.07)
Basophils Absolute: 0 10*3/uL (ref 0.0–0.1)
Basophils Relative: 1 %
Eosinophils Absolute: 0.1 10*3/uL (ref 0.0–0.5)
Eosinophils Relative: 4 %
HCT: 33.7 % — ABNORMAL LOW (ref 36.0–46.0)
Hemoglobin: 10.9 g/dL — ABNORMAL LOW (ref 12.0–15.0)
Immature Granulocytes: 0 %
Lymphocytes Relative: 24 %
Lymphs Abs: 0.7 10*3/uL (ref 0.7–4.0)
MCH: 28.2 pg (ref 26.0–34.0)
MCHC: 32.3 g/dL (ref 30.0–36.0)
MCV: 87.3 fL (ref 80.0–100.0)
Monocytes Absolute: 0.4 10*3/uL (ref 0.1–1.0)
Monocytes Relative: 12 %
Neutro Abs: 1.9 10*3/uL (ref 1.7–7.7)
Neutrophils Relative %: 59 %
Platelets: 202 10*3/uL (ref 150–400)
RBC: 3.86 MIL/uL — ABNORMAL LOW (ref 3.87–5.11)
RDW: 14.2 % (ref 11.5–15.5)
WBC: 3.1 10*3/uL — ABNORMAL LOW (ref 4.0–10.5)
nRBC: 0 % (ref 0.0–0.2)

## 2021-05-04 MED ORDER — SODIUM CHLORIDE 0.9% FLUSH
10.0000 mL | Freq: Once | INTRAVENOUS | Status: AC
  Administered 2021-05-04: 10 mL

## 2021-05-04 MED ORDER — SODIUM CHLORIDE 0.9% FLUSH
10.0000 mL | INTRAVENOUS | Status: DC | PRN
  Administered 2021-05-04: 10 mL

## 2021-05-04 MED ORDER — SODIUM CHLORIDE 0.9 % IV SOLN: Freq: Once | INTRAVENOUS | Status: AC

## 2021-05-04 MED ORDER — HEPARIN SOD (PORK) LOCK FLUSH 100 UNIT/ML IV SOLN
500.0000 [IU] | Freq: Once | INTRAVENOUS | Status: AC | PRN
  Administered 2021-05-04: 500 [IU]

## 2021-05-04 MED ORDER — MORPHINE SULFATE 15 MG PO TABS: 15.0000 mg | ORAL_TABLET | Freq: Four times a day (QID) | ORAL | 0 refills | Status: DC | PRN

## 2021-05-04 MED ORDER — SODIUM CHLORIDE 0.9 % IV SOLN
200.0000 mg | Freq: Once | INTRAVENOUS | Status: AC
  Administered 2021-05-04: 200 mg via INTRAVENOUS
  Filled 2021-05-04: qty 200

## 2021-05-04 NOTE — Patient Instructions (Signed)
[facility]  Discharge Instructions: °Thank you for choosing [facility] to provide your oncology and hematology care.  ° °If you have a lab appointment with the Cancer Center, please go directly to the Cancer Center and check in at the registration area. °  °Wear comfortable clothing and clothing appropriate for easy access to any Portacath or PICC line.  ° °We strive to give you quality time with your provider. You may need to reschedule your appointment if you arrive late (15 or more minutes).  Arriving late affects you and other patients whose appointments are after yours.  Also, if you miss three or more appointments without notifying the office, you may be dismissed from the clinic at the provider’s discretion.    °  °For prescription refill requests, have your pharmacy contact our office and allow 72 hours for refills to be completed.   ° °Today you received the following chemotherapy and/or immunotherapy agent: Pembrolizumab (Keytruda) °  °To help prevent nausea and vomiting after your treatment, we encourage you to take your nausea medication as directed. ° °BELOW ARE SYMPTOMS THAT SHOULD BE REPORTED IMMEDIATELY: °*FEVER GREATER THAN 100.4 F (38 °C) OR HIGHER °*CHILLS OR SWEATING °*NAUSEA AND VOMITING THAT IS NOT CONTROLLED WITH YOUR NAUSEA MEDICATION °*UNUSUAL SHORTNESS OF BREATH °*UNUSUAL BRUISING OR BLEEDING °*URINARY PROBLEMS (pain or burning when urinating, or frequent urination) °*BOWEL PROBLEMS (unusual diarrhea, constipation, pain near the anus) °TENDERNESS IN MOUTH AND THROAT WITH OR WITHOUT PRESENCE OF ULCERS (sore throat, sores in mouth, or a toothache) °UNUSUAL RASH, SWELLING OR PAIN  °UNUSUAL VAGINAL DISCHARGE OR ITCHING  ° °Items with * indicate a potential emergency and should be followed up as soon as possible or go to the Emergency Department if any problems should occur. ° °Please show the CHEMOTHERAPY ALERT CARD or IMMUNOTHERAPY ALERT CARD at  check-in to the Emergency Department and triage nurse. ° °Should you have questions after your visit or need to cancel or reschedule your appointment, please contact [facility]  Dept: [phone]  and follow the prompts.  Office hours are 8:00 a.m. to 4:30 p.m. Monday - Friday. Please note that voicemails left after 4:00 p.m. may not be returned until the following business day.  We are closed weekends and major holidays. You have access to a nurse at all times for urgent questions. Please call the main number to the clinic Dept: [phone] and follow the prompts. ° ° °For any non-urgent questions, you may also contact your provider using MyChart. We now offer e-Visits for anyone 76 and older to request care online for non-urgent symptoms. For details visit [email or web address]. °  °Also download the MyChart app! Go to the app store, search "MyChart", open the app, select [facility], and log in with your MyChart username and password. ° °Due to Covid, a mask is required upon entering the hospital/clinic. If you do not have a mask, one will be given to you upon arrival. For doctor visits, patients may have 1 support Jacobey Gura aged 76 or older with them. For treatment visits, patients cannot have anyone with them due to current Covid guidelines and our immunocompromised population.  ° °

## 2021-05-04 NOTE — Assessment & Plan Note (Signed)
She is taking both anticoagulation therapy and antiplatelet agent She has no recent bleeding She will continue her medications as directed  

## 2021-05-04 NOTE — Progress Notes (Signed)
[facility] ?OFFICE PROGRESS NOTE ? ?Patient Care Team: ?Nolene Ebbs, MD as PCP - General (Internal Medicine) ? ?ASSESSMENT & PLAN:  ?Uterine cancer ([place]) ?Her last imaging studies showed no signs of disease ?She has attained complete response to treatment ?We discussed the role of long-term treatment with pembrolizumab and she is in agreement to proceed ?I will continue to scan her every 6 months, next imaging study will be done in July of this year ? ?Cancer associated pain ?Her cancer associated pain is due to left hip fracture and related to her previous treatment ?She will continue her prescribed pain medicine ? ?Lower leg DVT (deep venous thromboembolism), chronic, left ([place]) ?She is taking both anticoagulation therapy and antiplatelet agent ?She has no recent bleeding ?She will continue her medications as directed ? ? ?No orders of the defined types were placed in this encounter. ? ? ?All questions were answered. The patient knows to call the clinic with any problems, questions or concerns. ?The total time spent in the appointment was 25 minutes encounter with patients including review of chart and various tests results, discussions about plan of care and coordination of care plan ?  ?[name], MD ?05/04/2021 12:34 PM ? ?INTERVAL HISTORY: ?Please see below for problem oriented charting. ?she returns for treatment follow-up on single agent pembrolizumab for recurrent uterine cancer ?She is doing well ?No side effects of treatment so far ?Her pain is well controlled with current prescribed pain medicine ?No recent bleeding complications from anticoagulation therapy ? ?REVIEW OF SYSTEMS:   ?Constitutional: Denies fevers, chills or abnormal weight loss ?Eyes: Denies blurriness of vision ?Ears, nose, mouth, throat, and face: Denies mucositis or sore throat ?Respiratory: Denies cough, dyspnea or wheezes ?Cardiovascular: Denies palpitation, chest discomfort or lower extremity  swelling ?Gastrointestinal:  Denies nausea, heartburn or change in bowel habits ?Skin: Denies abnormal skin rashes ?Lymphatics: Denies new lymphadenopathy or easy bruising ?Neurological:Denies numbness, tingling or new weaknesses ?Behavioral/Psych: Mood is stable, no new changes  ?All other systems were reviewed with the patient and are negative. ? ?I have reviewed the past medical history, past surgical history, social history and family history with the patient and they are unchanged from previous note. ? ?ALLERGIES:  has No Known Allergies. ? ?MEDICATIONS:  ?Current Outpatient Medications  ?Medication Sig Dispense Refill  ? apixaban (ELIQUIS) 2.5 MG TABS tablet Take by mouth 2 (two) times daily.    ? diclofenac sodium (VOLTAREN) 1 % GEL APPLY 4GRAMS 4 TIMES A DAY AS NEEDED FOR PAINS    ? gabapentin (NEURONTIN) 300 MG capsule Take 300 mg by mouth 3 (three) times daily.    ? lidocaine-prilocaine (EMLA) cream Apply 1 application topically daily as needed. 30 g 3  ? methadone (DOLOPHINE) 10 MG tablet Take 1 tablet (10 mg total) by mouth every 12 (twelve) hours. 60 tablet 0  ? morphine (MSIR) 15 MG tablet Take 1 tablet (15 mg total) by mouth every 6 (six) hours as needed for severe pain. 60 tablet 0  ? Olopatadine HCl 0.2 % SOLN Place 1 drop into both eyes daily.    ? ?No current facility-administered medications for this visit.  ? ?Facility-Administered Medications Ordered in Other Visits  ?Medication Dose Route Frequency Provider Last Rate Last Admin  ? heparin lock flush 100 unit/mL  500 Units Intracatheter Once PRN Alvy Bimler, Nyrie Sigal, MD      ? sodium chloride flush (NS) 0.9 % injection 10 mL  10 mL Intracatheter PRN [name], MD      ? ? ?  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  ?Uterine cancer ([place])  ?07/03/2010 Pathology Results  ? 1. Uterus +/-  tubes/ovaries, neoplastic, with left fallopian tube and ovary ?- INVASIVE ENDOMETRIOID CARCINOMA (1.5 CM), FIGO GRADE II, ARISING IN A BACKGROUND OF ATYPICAL COMPLEX HYPERPLASIA, CONFINED WITHIN INNER HALF OF THE MYOMETRIUM. ?- ENDOMETRIAL POLYP WITH ASSOCIATED ATYPICAL COMPLEX HYPERPLASIA. ?- MYOMETRIUM: LEIOMYOMATA. ?- CERVIX: BENIGN SQUAMOUS MUCOSA AND ENDOCERVICAL MUCOSA, NO DYSPLASIA OR MALIGNANCY. ?- LEFT OVARY: BENIGN OVARIAN TISSUE WITH ENDOSALPINGOSIS, NO EVIDENCE OF ATYPIA OR MALIGNANCY. ?- LEFT FALLOPIAN TUBE: NO HISTOLOGIC ABNORMALITIES. ?- PLEASE SEE ONCOLOGY TEMPLATE FOR DETAIL. ?2. Ovary and fallopian tube, right ?- BENIGN OVARIAN TISSUE WITH ENDOSALPINGOSIS, NO ATYPIA OR MALIGNANCY. ?- BENIGN FALLOPIAN TUBAL TISSUE, NO PATHOLOGIC ABNORMALITIES. ?Microscopic Comment ?1. UTERUS ?Specimen: Uterus, cervix, bilateral ovaries and fallopian tubes ?Procedure: Total hysterectomy and bilateral salpingo-oophorectomy ?Lymph node sampling performed: No ?Specimen integrity: Intact ?Maximum tumor size (cm): 1.5 cm, glass slide measurement ?Histologic type: Invasive endometrioid carcinoma ?Grade: FIGO grade II ?Myometrial invasion: 1 cm where myometrium is 2.3 cm in thickness ?Cervical stromal involvement: No ?Extent of involvement of other organs: No ?Lymph vascular invasion: Not identified ?Peritoneal washings: Negative ([id number]) ?Lymph nodes: number examined N/A; number positive N/A ?TNM code: pT1a, pNX ?1 oFf 3IGO Stage (based on pathologic findings, needs clinical correlation): IA ? ?Comments: Sections the endomyometrium away from the grossly identified endometrial polyp show an invasive FIGO grade II endometrioid carcinoma. The tumor is confined within inner half of the myometrium. No angiolymphatic invasion is identified. No cervical stromal involvement is identified. Sections of the grossly identified endometrial polyp show an endometrial polyp with associated atypical compacted hyperplasia with no  definitive evidence of carcinoma. ?  ?12/07/2017 Imaging  ? US venous Doppler ?Right: No evidence of common femoral vein obstruction. ?Left: Findings consistent with acute deep vein thrombosis involving the left femoral vein, left proximal profunda vein, and left popliteal vein. Unable to adequately interrogate the common femoral and higher, or the calf secondary to significant edema and body habitus ?  ?12/07/2017 -  Hospital Admission  ? She presented to the ER and was diagnosed with acute DVT ?  ?01/18/2018 - 01/21/2018 Hospital Admission  ? She was admitted to the hospital for management of severe persistent DVT ?  ?01/18/2018 Imaging  ? US venous Doppler ?Right: No evidence of common femoral vein obstruction. ?Left: Findings consistent with acute deep vein thrombosis involving the left common femoral vein, and left popliteal vein. ?  ?01/19/2018 Surgery  ? Pre-operative Diagnosis: Subacute DVT with severe post thrombotic syndrome ?Post-operative diagnosis:  Same ?Surgeon:  Eda Paschal. Donzetta Matters, MD ?Procedure Performed: ?1.  Ultrasound-guided cannulation left small saphenous vein ?2.  Left lower extremity and central venography ?3.  Intravascular ultrasound of left popliteal, femoral, common femoral, external and common iliac veins and IVC ?4.  Stent of left common and external iliac veins with 14 x 60 mm Vici ?5.  Moderate sedation with fentanyl and Versed for 50 minutes ?  ?Indications: 76 year old female with a history of DVT in October now presents with persistent left lower extremity swelling and ultrasound demonstrating likely persistent DVT.  She has been on Xarelto at this time.  She is now indicated for venogram possible intervention. ?  ?Findings: Flow in the left lower extremity was stagnant throughout but by venogram all veins were patent.  There  was a focal occlusive area approximately 2 cm in length at the common and external iliac vein junction at the hypogastric on the left.  After stenting and ballooning  we had a diameter of 12 millimeters in the stent and venogram demonstrated flow in the lower extremity veins were previously was stagnant and no further residual stenosis in the left common and external iliac vein junction. ?  ?04/12/2018

## 2021-05-04 NOTE — Assessment & Plan Note (Signed)
Her cancer associated pain is due to left hip fracture and related to her previous treatment She will continue her prescribed pain medicine 

## 2021-05-04 NOTE — Assessment & Plan Note (Signed)
Her last imaging studies showed no signs of disease ?She has attained complete response to treatment ?We discussed the role of long-term treatment with pembrolizumab and she is in agreement to proceed ?I will continue to scan her every 6 months, next imaging study will be done in July of this year ?

## 2021-05-25 ENCOUNTER — Inpatient Hospital Stay: Payer: Medicare Other

## 2021-05-25 ENCOUNTER — Encounter: Payer: Self-pay | Admitting: Hematology and Oncology

## 2021-05-25 ENCOUNTER — Inpatient Hospital Stay: Payer: Medicare Other | Attending: Hematology and Oncology

## 2021-05-25 ENCOUNTER — Other Ambulatory Visit: Payer: Self-pay | Admitting: Hematology and Oncology

## 2021-05-25 ENCOUNTER — Other Ambulatory Visit: Payer: Self-pay

## 2021-05-25 ENCOUNTER — Other Ambulatory Visit (HOSPITAL_COMMUNITY): Payer: Self-pay

## 2021-05-25 ENCOUNTER — Inpatient Hospital Stay (HOSPITAL_BASED_OUTPATIENT_CLINIC_OR_DEPARTMENT_OTHER): Payer: Medicare Other | Admitting: Hematology and Oncology

## 2021-05-25 DIAGNOSIS — K449 Diaphragmatic hernia without obstruction or gangrene: Secondary | ICD-10-CM | POA: Diagnosis not present

## 2021-05-25 DIAGNOSIS — K5730 Diverticulosis of large intestine without perforation or abscess without bleeding: Secondary | ICD-10-CM | POA: Diagnosis not present

## 2021-05-25 DIAGNOSIS — I700 Atherosclerosis of aorta: Secondary | ICD-10-CM | POA: Diagnosis not present

## 2021-05-25 DIAGNOSIS — C55 Malignant neoplasm of uterus, part unspecified: Secondary | ICD-10-CM

## 2021-05-25 DIAGNOSIS — D61818 Other pancytopenia: Secondary | ICD-10-CM | POA: Diagnosis not present

## 2021-05-25 DIAGNOSIS — G893 Neoplasm related pain (acute) (chronic): Secondary | ICD-10-CM | POA: Diagnosis not present

## 2021-05-25 DIAGNOSIS — Z5112 Encounter for antineoplastic immunotherapy: Secondary | ICD-10-CM | POA: Diagnosis present

## 2021-05-25 DIAGNOSIS — E039 Hypothyroidism, unspecified: Secondary | ICD-10-CM

## 2021-05-25 DIAGNOSIS — Z86718 Personal history of other venous thrombosis and embolism: Secondary | ICD-10-CM | POA: Diagnosis not present

## 2021-05-25 DIAGNOSIS — Z9221 Personal history of antineoplastic chemotherapy: Secondary | ICD-10-CM | POA: Insufficient documentation

## 2021-05-25 DIAGNOSIS — Z79899 Other long term (current) drug therapy: Secondary | ICD-10-CM | POA: Diagnosis not present

## 2021-05-25 DIAGNOSIS — C801 Malignant (primary) neoplasm, unspecified: Secondary | ICD-10-CM

## 2021-05-25 DIAGNOSIS — C7951 Secondary malignant neoplasm of bone: Secondary | ICD-10-CM | POA: Insufficient documentation

## 2021-05-25 DIAGNOSIS — Z791 Long term (current) use of non-steroidal anti-inflammatories (NSAID): Secondary | ICD-10-CM | POA: Insufficient documentation

## 2021-05-25 DIAGNOSIS — C774 Secondary and unspecified malignant neoplasm of inguinal and lower limb lymph nodes: Secondary | ICD-10-CM

## 2021-05-25 DIAGNOSIS — Z7901 Long term (current) use of anticoagulants: Secondary | ICD-10-CM | POA: Insufficient documentation

## 2021-05-25 DIAGNOSIS — C779 Secondary and unspecified malignant neoplasm of lymph node, unspecified: Secondary | ICD-10-CM | POA: Insufficient documentation

## 2021-05-25 DIAGNOSIS — Z7189 Other specified counseling: Secondary | ICD-10-CM

## 2021-05-25 LAB — CBC WITH DIFFERENTIAL/PLATELET
Abs Immature Granulocytes: 0.01 10*3/uL (ref 0.00–0.07)
Basophils Absolute: 0 10*3/uL (ref 0.0–0.1)
Basophils Relative: 1 %
Eosinophils Absolute: 0.3 10*3/uL (ref 0.0–0.5)
Eosinophils Relative: 7 %
HCT: 33.9 % — ABNORMAL LOW (ref 36.0–46.0)
Hemoglobin: 11 g/dL — ABNORMAL LOW (ref 12.0–15.0)
Immature Granulocytes: 0 %
Lymphocytes Relative: 23 %
Lymphs Abs: 0.8 10*3/uL (ref 0.7–4.0)
MCH: 28.4 pg (ref 26.0–34.0)
MCHC: 32.4 g/dL (ref 30.0–36.0)
MCV: 87.4 fL (ref 80.0–100.0)
Monocytes Absolute: 0.4 10*3/uL (ref 0.1–1.0)
Monocytes Relative: 11 %
Neutro Abs: 2.1 10*3/uL (ref 1.7–7.7)
Neutrophils Relative %: 58 %
Platelets: 196 10*3/uL (ref 150–400)
RBC: 3.88 MIL/uL (ref 3.87–5.11)
RDW: 14.3 % (ref 11.5–15.5)
WBC: 3.5 10*3/uL — ABNORMAL LOW (ref 4.0–10.5)
nRBC: 0 % (ref 0.0–0.2)

## 2021-05-25 LAB — COMPREHENSIVE METABOLIC PANEL
ALT: 7 U/L (ref 0–44)
AST: 10 U/L — ABNORMAL LOW (ref 15–41)
Albumin: 4 g/dL (ref 3.5–5.0)
Alkaline Phosphatase: 114 U/L (ref 38–126)
Anion gap: 6 (ref 5–15)
BUN: 16 mg/dL (ref 8–23)
CO2: 27 mmol/L (ref 22–32)
Calcium: 9.2 mg/dL (ref 8.9–10.3)
Chloride: 106 mmol/L (ref 98–111)
Creatinine, Ser: 0.72 mg/dL (ref 0.44–1.00)
GFR, Estimated: >60 mL/min (ref >60)
Glucose, Bld: 90 mg/dL (ref 70–99)
Potassium: 3.7 mmol/L (ref 3.5–5.1)
Sodium: 139 mmol/L (ref 135–145)
Total Bilirubin: 0.5 mg/dL (ref 0.3–1.2)
Total Protein: 7.4 g/dL (ref 6.5–8.1)

## 2021-05-25 LAB — TSH: TSH: 2.448 u[IU]/mL (ref 0.308–3.960)

## 2021-05-25 MED ORDER — SODIUM CHLORIDE 0.9% FLUSH
10.0000 mL | Freq: Once | INTRAVENOUS | Status: AC
  Administered 2021-05-25: 10 mL

## 2021-05-25 MED ORDER — HEPARIN SOD (PORK) LOCK FLUSH 100 UNIT/ML IV SOLN
500.0000 [IU] | Freq: Once | INTRAVENOUS | Status: AC | PRN
  Administered 2021-05-25: 500 [IU]

## 2021-05-25 MED ORDER — SODIUM CHLORIDE 0.9% FLUSH
10.0000 mL | INTRAVENOUS | Status: DC | PRN
  Administered 2021-05-25: 10 mL

## 2021-05-25 MED ORDER — SODIUM CHLORIDE 0.9 % IV SOLN: Freq: Once | INTRAVENOUS | Status: AC

## 2021-05-25 MED ORDER — MORPHINE SULFATE 15 MG PO TABS
15.0000 mg | ORAL_TABLET | Freq: Four times a day (QID) | ORAL | 0 refills | Status: DC | PRN
  Filled 2021-05-25 (×3): qty 60, 15d supply, fill #0

## 2021-05-25 MED ORDER — SODIUM CHLORIDE 0.9 % IV SOLN
200.0000 mg | Freq: Once | INTRAVENOUS | Status: AC
  Administered 2021-05-25: 200 mg via INTRAVENOUS
  Filled 2021-05-25: qty 200

## 2021-05-25 NOTE — Assessment & Plan Note (Signed)
Her last imaging studies showed no signs of disease ?She has attained complete response to treatment ?We discussed the role of long-term treatment with pembrolizumab and she is in agreement to proceed ?I will continue to scan her every 6-9 months, next imaging study will be done in July of this year ?

## 2021-05-25 NOTE — Progress Notes (Signed)
[facility] ?OFFICE PROGRESS NOTE ? ?Patient Care Team: ?Nolene Ebbs, MD as PCP - General (Internal Medicine) ? ?ASSESSMENT & PLAN:  ?Uterine cancer ([place]) ?Her last imaging studies showed no signs of disease ?She has attained complete response to treatment ?We discussed the role of long-term treatment with pembrolizumab and she is in agreement to proceed ?I will continue to scan her every 6-9 months, next imaging study will be done in July of this year ? ?Cancer associated pain ?Her cancer associated pain is due to left hip fracture and related to her previous treatment ?She will continue her prescribed pain medicine ? ?Pancytopenia, acquired ([place]) ?This is due to her treatment ?She is not symptomatic ?Observe ? ?No orders of the defined types were placed in this encounter. ? ? ?All questions were answered. The patient knows to call the clinic with any problems, questions or concerns. ?The total time spent in the appointment was 25 minutes encounter with patients including review of chart and various tests results, discussions about plan of care and coordination of care plan ?  ?[name], MD ?05/25/2021 11:04 AM ? ?INTERVAL HISTORY: ?Please see below for problem oriented charting. ?she returns for treatment follow-up on single agent pembrolizumab ?She is doing well ?No recent side effects from treatment ?Denies bleeding from Eliquis ?She had troubles getting prescription pain medicine refilled by her local pharmacy ? ?REVIEW OF SYSTEMS:   ?Constitutional: Denies fevers, chills or abnormal weight loss ?Eyes: Denies blurriness of vision ?Ears, nose, mouth, throat, and face: Denies mucositis or sore throat ?Respiratory: Denies cough, dyspnea or wheezes ?Cardiovascular: Denies palpitation, chest discomfort or lower extremity swelling ?Gastrointestinal:  Denies nausea, heartburn or change in bowel habits ?Skin: Denies abnormal skin rashes ?Lymphatics: Denies new lymphadenopathy or easy  bruising ?Neurological:Denies numbness, tingling or new weaknesses ?Behavioral/Psych: Mood is stable, no new changes  ?All other systems were reviewed with the patient and are negative. ? ?I have reviewed the past medical history, past surgical history, social history and family history with the patient and they are unchanged from previous note. ? ?ALLERGIES:  has No Known Allergies. ? ?MEDICATIONS:  ?Current Outpatient Medications  ?Medication Sig Dispense Refill  ? apixaban (ELIQUIS) 2.5 MG TABS tablet Take by mouth 2 (two) times daily.    ? diclofenac sodium (VOLTAREN) 1 % GEL APPLY 4GRAMS 4 TIMES A DAY AS NEEDED FOR PAINS    ? gabapentin (NEURONTIN) 300 MG capsule Take 300 mg by mouth 3 (three) times daily.    ? lidocaine-prilocaine (EMLA) cream Apply 1 application topically daily as needed. 30 g 3  ? methadone (DOLOPHINE) 10 MG tablet Take 1 tablet (10 mg total) by mouth every 12 (twelve) hours. 60 tablet 0  ? morphine (MSIR) 15 MG tablet Take 1 tablet (15 mg total) by mouth every 6 (six) hours as needed for severe pain. 60 tablet 0  ? Olopatadine HCl 0.2 % SOLN Place 1 drop into both eyes daily.    ? ?No current facility-administered medications for this visit.  ? ? 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  ?Uterine cancer ([place])  ?07/03/2010 Pathology Results  ? 1. Uterus +/- tubes/ovaries, neoplastic, with left fallopian tube and ovary ?- INVASIVE ENDOMETRIOID CARCINOMA (1.5 CM), FIGO GRADE II, ARISING IN A BACKGROUND OF ATYPICAL COMPLEX HYPERPLASIA, CONFINED WITHIN INNER HALF OF THE MYOMETRIUM. ?- ENDOMETRIAL POLYP  WITH ASSOCIATED ATYPICAL COMPLEX HYPERPLASIA. ?- MYOMETRIUM: LEIOMYOMATA. ?- CERVIX: BENIGN SQUAMOUS MUCOSA AND ENDOCERVICAL MUCOSA, NO DYSPLASIA OR MALIGNANCY. ?- LEFT OVARY: BENIGN OVARIAN TISSUE WITH ENDOSALPINGOSIS, NO EVIDENCE OF  ATYPIA OR MALIGNANCY. ?- LEFT FALLOPIAN TUBE: NO HISTOLOGIC ABNORMALITIES. ?- PLEASE SEE ONCOLOGY TEMPLATE FOR DETAIL. ?2. Ovary and fallopian tube, right ?- BENIGN OVARIAN TISSUE WITH ENDOSALPINGOSIS, NO ATYPIA OR MALIGNANCY. ?- BENIGN FALLOPIAN TUBAL TISSUE, NO PATHOLOGIC ABNORMALITIES. ?Microscopic Comment ?1. UTERUS ?Specimen: Uterus, cervix, bilateral ovaries and fallopian tubes ?Procedure: Total hysterectomy and bilateral salpingo-oophorectomy ?Lymph node sampling performed: No ?Specimen integrity: Intact ?Maximum tumor size (cm): 1.5 cm, glass slide measurement ?Histologic type: Invasive endometrioid carcinoma ?Grade: FIGO grade II ?Myometrial invasion: 1 cm where myometrium is 2.3 cm in thickness ?Cervical stromal involvement: No ?Extent of involvement of other organs: No ?Lymph vascular invasion: Not identified ?Peritoneal washings: Negative ([id number]) ?Lymph nodes: number examined N/A; number positive N/A ?TNM code: pT1a, pNX ?1 oFf 3IGO Stage (based on pathologic findings, needs clinical correlation): IA ? ?Comments: Sections the endomyometrium away from the grossly identified endometrial polyp show an invasive FIGO grade II endometrioid carcinoma. The tumor is confined within inner half of the myometrium. No angiolymphatic invasion is identified. No cervical stromal involvement is identified. Sections of the grossly identified endometrial polyp show an endometrial polyp with associated atypical compacted hyperplasia with no definitive evidence of carcinoma. ?  ?12/07/2017 Imaging  ? US venous Doppler ?Right: No evidence of common femoral vein obstruction. ?Left: Findings consistent with acute deep vein thrombosis involving the left femoral vein, left proximal profunda vein, and left popliteal vein. Unable to adequately interrogate the common femoral and higher, or the calf secondary to significant edema and body habitus ?  ?12/07/2017 -  Hospital Admission  ? She presented to the ER and was diagnosed  with acute DVT ?  ?01/18/2018 - 01/21/2018 Hospital Admission  ? She was admitted to the hospital for management of severe persistent DVT ?  ?01/18/2018 Imaging  ? US venous Doppler ?Right: No evidence of common femoral vein obstruction. ?Left: Findings consistent with acute deep vein thrombosis involving the left common femoral vein, and left popliteal vein. ?  ?01/19/2018 Surgery  ? Pre-operative Diagnosis: Subacute DVT with severe post thrombotic syndrome ?Post-operative diagnosis:  Same ?Surgeon:  Eda Paschal. Donzetta Matters, MD ?Procedure Performed: ?1.  Ultrasound-guided cannulation left small saphenous vein ?2.  Left lower extremity and central venography ?3.  Intravascular ultrasound of left popliteal, femoral, common femoral, external and common iliac veins and IVC ?4.  Stent of left common and external iliac veins with 14 x 60 mm Vici ?5.  Moderate sedation with fentanyl and Versed for 50 minutes ?  ?Indications: 76 year old female with a history of DVT in October now presents with persistent left lower extremity swelling and ultrasound demonstrating likely persistent DVT.  She has been on Xarelto at this time.  She is now indicated for venogram possible intervention. ?  ?Findings: Flow in the left lower extremity was stagnant throughout but by venogram all veins were patent.  There was a focal occlusive area approximately 2 cm in length at the common and external iliac vein junction at the hypogastric on the left.  After stenting and ballooning we had a diameter of 12 millimeters in the stent and venogram demonstrated flow in the lower extremity veins were previously was stagnant and no further residual stenosis in the left common and external iliac vein junction. ?  ?04/12/2018 Imaging  ? US Venous Doppler ?Right: No evidence of  common femoral vein obstruction. ?Left: There is no evidence of deep vein thrombosis in the lower extremity. However, portions of this examination were limited- see technologist comments above.  Left groin: Large hypoechoic area with mixed echoes noted measuring nearly 10 cm. Possible  ?hematoma versus unknown etiology. Ultrasound characteristics of enlarged lymph nodes noted in the groin. ?  ? ?  ?05/15/2018 Imaging

## 2021-05-25 NOTE — Assessment & Plan Note (Signed)
This is due to her treatment She is not symptomatic Observe 

## 2021-05-25 NOTE — Assessment & Plan Note (Signed)
Her cancer associated pain is due to left hip fracture and related to her previous treatment She will continue her prescribed pain medicine 

## 2021-06-15 ENCOUNTER — Inpatient Hospital Stay: Payer: Medicare Other

## 2021-06-15 ENCOUNTER — Other Ambulatory Visit: Payer: Self-pay

## 2021-06-15 VITALS — BP 141/66 | HR 88 | Temp 98.2°F | Resp 17 | Wt 229.9 lb

## 2021-06-15 DIAGNOSIS — Z5112 Encounter for antineoplastic immunotherapy: Secondary | ICD-10-CM | POA: Diagnosis not present

## 2021-06-15 DIAGNOSIS — Z7189 Other specified counseling: Secondary | ICD-10-CM

## 2021-06-15 DIAGNOSIS — E039 Hypothyroidism, unspecified: Secondary | ICD-10-CM

## 2021-06-15 DIAGNOSIS — C774 Secondary and unspecified malignant neoplasm of inguinal and lower limb lymph nodes: Secondary | ICD-10-CM

## 2021-06-15 DIAGNOSIS — C55 Malignant neoplasm of uterus, part unspecified: Secondary | ICD-10-CM

## 2021-06-15 DIAGNOSIS — C7951 Secondary malignant neoplasm of bone: Secondary | ICD-10-CM

## 2021-06-15 DIAGNOSIS — C801 Malignant (primary) neoplasm, unspecified: Secondary | ICD-10-CM

## 2021-06-15 LAB — COMPREHENSIVE METABOLIC PANEL
ALT: 6 U/L (ref 0–44)
AST: 10 U/L — ABNORMAL LOW (ref 15–41)
Albumin: 4 g/dL (ref 3.5–5.0)
Alkaline Phosphatase: 117 U/L (ref 38–126)
Anion gap: 3 — ABNORMAL LOW (ref 5–15)
BUN: 13 mg/dL (ref 8–23)
CO2: 26 mmol/L (ref 22–32)
Calcium: 9.1 mg/dL (ref 8.9–10.3)
Chloride: 109 mmol/L (ref 98–111)
Creatinine, Ser: 0.86 mg/dL (ref 0.44–1.00)
GFR, Estimated: >60 mL/min (ref >60)
Glucose, Bld: 103 mg/dL — ABNORMAL HIGH (ref 70–99)
Potassium: 3.9 mmol/L (ref 3.5–5.1)
Sodium: 138 mmol/L (ref 135–145)
Total Bilirubin: 0.5 mg/dL (ref 0.3–1.2)
Total Protein: 7.3 g/dL (ref 6.5–8.1)

## 2021-06-15 LAB — CBC WITH DIFFERENTIAL/PLATELET
Abs Immature Granulocytes: 0.01 10*3/uL (ref 0.00–0.07)
Basophils Absolute: 0 10*3/uL (ref 0.0–0.1)
Basophils Relative: 0 %
Eosinophils Absolute: 0.1 10*3/uL (ref 0.0–0.5)
Eosinophils Relative: 4 %
HCT: 34 % — ABNORMAL LOW (ref 36.0–46.0)
Hemoglobin: 11.3 g/dL — ABNORMAL LOW (ref 12.0–15.0)
Immature Granulocytes: 0 %
Lymphocytes Relative: 27 %
Lymphs Abs: 0.9 10*3/uL (ref 0.7–4.0)
MCH: 29 pg (ref 26.0–34.0)
MCHC: 33.2 g/dL (ref 30.0–36.0)
MCV: 87.2 fL (ref 80.0–100.0)
Monocytes Absolute: 0.3 10*3/uL (ref 0.1–1.0)
Monocytes Relative: 10 %
Neutro Abs: 2 10*3/uL (ref 1.7–7.7)
Neutrophils Relative %: 59 %
Platelets: 198 10*3/uL (ref 150–400)
RBC: 3.9 MIL/uL (ref 3.87–5.11)
RDW: 13.9 % (ref 11.5–15.5)
WBC: 3.3 10*3/uL — ABNORMAL LOW (ref 4.0–10.5)
nRBC: 0 % (ref 0.0–0.2)

## 2021-06-15 MED ORDER — SODIUM CHLORIDE 0.9% FLUSH
10.0000 mL | INTRAVENOUS | Status: DC | PRN
  Administered 2021-06-15: 10 mL

## 2021-06-15 MED ORDER — SODIUM CHLORIDE 0.9% FLUSH
10.0000 mL | Freq: Once | INTRAVENOUS | Status: AC
  Administered 2021-06-15: 10 mL

## 2021-06-15 MED ORDER — SODIUM CHLORIDE 0.9 % IV SOLN: Freq: Once | INTRAVENOUS | Status: AC

## 2021-06-15 MED ORDER — HEPARIN SOD (PORK) LOCK FLUSH 100 UNIT/ML IV SOLN
500.0000 [IU] | Freq: Once | INTRAVENOUS | Status: AC | PRN
  Administered 2021-06-15: 500 [IU]

## 2021-06-15 MED ORDER — SODIUM CHLORIDE 0.9 % IV SOLN
200.0000 mg | Freq: Once | INTRAVENOUS | Status: AC
  Administered 2021-06-15: 200 mg via INTRAVENOUS
  Filled 2021-06-15: qty 200

## 2021-06-15 NOTE — Patient Instructions (Signed)
[facility]  Discharge Instructions: ?Thank you for choosing [facility] to provide your oncology and hematology care.  ? ?If you have a lab appointment with the Cancer Center, please go directly to the Cancer Center and check in at the registration area. ?  ?Wear comfortable clothing and clothing appropriate for easy access to any Portacath or PICC line.  ? ?We strive to give you quality time with your provider. You may need to reschedule your appointment if you arrive late (15 or more minutes).  Arriving late affects you and other patients whose appointments are after yours.  Also, if you miss three or more appointments without notifying the office, you may be dismissed from the clinic at the provider?s discretion.    ?  ?For prescription refill requests, have your pharmacy contact our office and allow 72 hours for refills to be completed.   ? ?Today you received the following chemotherapy and/or immunotherapy agents: Keytruda ?  ?To help prevent nausea and vomiting after your treatment, we encourage you to take your nausea medication as directed. ? ?BELOW ARE SYMPTOMS THAT SHOULD BE REPORTED IMMEDIATELY: ?*FEVER GREATER THAN 100.4 F (38 ?C) OR HIGHER ?*CHILLS OR SWEATING ?*NAUSEA AND VOMITING THAT IS NOT CONTROLLED WITH YOUR NAUSEA MEDICATION ?*UNUSUAL SHORTNESS OF BREATH ?*UNUSUAL BRUISING OR BLEEDING ?*URINARY PROBLEMS (pain or burning when urinating, or frequent urination) ?*BOWEL PROBLEMS (unusual diarrhea, constipation, pain near the anus) ?TENDERNESS IN MOUTH AND THROAT WITH OR WITHOUT PRESENCE OF ULCERS (sore throat, sores in mouth, or a toothache) ?UNUSUAL RASH, SWELLING OR PAIN  ?UNUSUAL VAGINAL DISCHARGE OR ITCHING  ? ?Items with * indicate a potential emergency and should be followed up as soon as possible or go to the Emergency Department if any problems should occur. ? ?Please show the CHEMOTHERAPY ALERT CARD or IMMUNOTHERAPY ALERT CARD at check-in to the  Emergency Department and triage nurse. ? ?Should you have questions after your visit or need to cancel or reschedule your appointment, please contact [facility]  Dept: [phone]  and follow the prompts.  Office hours are 8:00 a.m. to 4:30 p.m. Monday - Friday. Please note that voicemails left after 4:00 p.m. may not be returned until the following business day.  We are closed weekends and major holidays. You have access to a nurse at all times for urgent questions. Please call the main number to the clinic Dept: [phone] and follow the prompts. ? ? ?For any non-urgent questions, you may also contact your provider using MyChart. We now offer e-Visits for anyone 18 and older to request care online for non-urgent symptoms. For details visit [email or web address]. ?  ?Also download the MyChart app! Go to the app store, search "MyChart", open the app, select [facility], and log in with your MyChart username and password. ? ?Due to Covid, a mask is required upon entering the hospital/clinic. If you do not have a mask, one will be given to you upon arrival. For doctor visits, patients may have 1 support person aged 18 or older with them. For treatment visits, patients cannot have anyone with them due to current Covid guidelines and our immunocompromised population.  ? ?

## 2021-07-06 ENCOUNTER — Other Ambulatory Visit: Payer: Self-pay

## 2021-07-06 ENCOUNTER — Inpatient Hospital Stay: Payer: Medicare Other | Attending: Hematology and Oncology | Admitting: Hematology and Oncology

## 2021-07-06 ENCOUNTER — Inpatient Hospital Stay: Payer: Medicare Other

## 2021-07-06 ENCOUNTER — Encounter: Payer: Self-pay | Admitting: Hematology and Oncology

## 2021-07-06 DIAGNOSIS — K449 Diaphragmatic hernia without obstruction or gangrene: Secondary | ICD-10-CM | POA: Insufficient documentation

## 2021-07-06 DIAGNOSIS — C55 Malignant neoplasm of uterus, part unspecified: Secondary | ICD-10-CM

## 2021-07-06 DIAGNOSIS — Z7901 Long term (current) use of anticoagulants: Secondary | ICD-10-CM | POA: Insufficient documentation

## 2021-07-06 DIAGNOSIS — Z86718 Personal history of other venous thrombosis and embolism: Secondary | ICD-10-CM | POA: Insufficient documentation

## 2021-07-06 DIAGNOSIS — K5730 Diverticulosis of large intestine without perforation or abscess without bleeding: Secondary | ICD-10-CM | POA: Insufficient documentation

## 2021-07-06 DIAGNOSIS — C541 Malignant neoplasm of endometrium: Secondary | ICD-10-CM | POA: Insufficient documentation

## 2021-07-06 DIAGNOSIS — D61818 Other pancytopenia: Secondary | ICD-10-CM | POA: Diagnosis not present

## 2021-07-06 DIAGNOSIS — C7951 Secondary malignant neoplasm of bone: Secondary | ICD-10-CM | POA: Insufficient documentation

## 2021-07-06 DIAGNOSIS — G893 Neoplasm related pain (acute) (chronic): Secondary | ICD-10-CM | POA: Insufficient documentation

## 2021-07-06 DIAGNOSIS — C774 Secondary and unspecified malignant neoplasm of inguinal and lower limb lymph nodes: Secondary | ICD-10-CM

## 2021-07-06 DIAGNOSIS — Z9221 Personal history of antineoplastic chemotherapy: Secondary | ICD-10-CM | POA: Diagnosis not present

## 2021-07-06 DIAGNOSIS — Z5112 Encounter for antineoplastic immunotherapy: Secondary | ICD-10-CM | POA: Diagnosis present

## 2021-07-06 DIAGNOSIS — E039 Hypothyroidism, unspecified: Secondary | ICD-10-CM

## 2021-07-06 DIAGNOSIS — Z79899 Other long term (current) drug therapy: Secondary | ICD-10-CM | POA: Insufficient documentation

## 2021-07-06 DIAGNOSIS — Z7189 Other specified counseling: Secondary | ICD-10-CM

## 2021-07-06 DIAGNOSIS — C801 Malignant (primary) neoplasm, unspecified: Secondary | ICD-10-CM

## 2021-07-06 LAB — CBC WITH DIFFERENTIAL/PLATELET
Abs Immature Granulocytes: 0.01 10*3/uL (ref 0.00–0.07)
Basophils Absolute: 0 10*3/uL (ref 0.0–0.1)
Basophils Relative: 1 %
Eosinophils Absolute: 0.1 10*3/uL (ref 0.0–0.5)
Eosinophils Relative: 3 %
HCT: 34.2 % — ABNORMAL LOW (ref 36.0–46.0)
Hemoglobin: 11.1 g/dL — ABNORMAL LOW (ref 12.0–15.0)
Immature Granulocytes: 0 %
Lymphocytes Relative: 25 %
Lymphs Abs: 0.9 10*3/uL (ref 0.7–4.0)
MCH: 28.5 pg (ref 26.0–34.0)
MCHC: 32.5 g/dL (ref 30.0–36.0)
MCV: 87.9 fL (ref 80.0–100.0)
Monocytes Absolute: 0.5 10*3/uL (ref 0.1–1.0)
Monocytes Relative: 12 %
Neutro Abs: 2.2 10*3/uL (ref 1.7–7.7)
Neutrophils Relative %: 59 %
Platelets: 217 10*3/uL (ref 150–400)
RBC: 3.89 MIL/uL (ref 3.87–5.11)
RDW: 14.3 % (ref 11.5–15.5)
WBC: 3.7 10*3/uL — ABNORMAL LOW (ref 4.0–10.5)
nRBC: 0 % (ref 0.0–0.2)

## 2021-07-06 LAB — COMPREHENSIVE METABOLIC PANEL
ALT: 7 U/L (ref 0–44)
AST: 11 U/L — ABNORMAL LOW (ref 15–41)
Albumin: 3.9 g/dL (ref 3.5–5.0)
Alkaline Phosphatase: 110 U/L (ref 38–126)
Anion gap: 5 (ref 5–15)
BUN: 9 mg/dL (ref 8–23)
CO2: 27 mmol/L (ref 22–32)
Calcium: 9.1 mg/dL (ref 8.9–10.3)
Chloride: 107 mmol/L (ref 98–111)
Creatinine, Ser: 0.79 mg/dL (ref 0.44–1.00)
GFR, Estimated: >60 mL/min (ref >60)
Glucose, Bld: 90 mg/dL (ref 70–99)
Potassium: 4 mmol/L (ref 3.5–5.1)
Sodium: 139 mmol/L (ref 135–145)
Total Bilirubin: 0.7 mg/dL (ref 0.3–1.2)
Total Protein: 7.4 g/dL (ref 6.5–8.1)

## 2021-07-06 LAB — TSH: TSH: 3.017 u[IU]/mL (ref 0.350–4.500)

## 2021-07-06 MED ORDER — SODIUM CHLORIDE 0.9% FLUSH
10.0000 mL | Freq: Once | INTRAVENOUS | Status: AC
  Administered 2021-07-06: 10 mL

## 2021-07-06 MED ORDER — SODIUM CHLORIDE 0.9 % IV SOLN: Freq: Once | INTRAVENOUS | Status: AC

## 2021-07-06 MED ORDER — SODIUM CHLORIDE 0.9 % IV SOLN
200.0000 mg | Freq: Once | INTRAVENOUS | Status: AC
  Administered 2021-07-06: 200 mg via INTRAVENOUS
  Filled 2021-07-06: qty 200

## 2021-07-06 MED ORDER — SODIUM CHLORIDE 0.9% FLUSH
10.0000 mL | INTRAVENOUS | Status: DC | PRN
  Administered 2021-07-06: 10 mL

## 2021-07-06 MED ORDER — HEPARIN SOD (PORK) LOCK FLUSH 100 UNIT/ML IV SOLN
500.0000 [IU] | Freq: Once | INTRAVENOUS | Status: AC | PRN
  Administered 2021-07-06: 500 [IU]

## 2021-07-06 NOTE — Assessment & Plan Note (Signed)
Her cancer associated pain is due to left hip fracture and related to her previous treatment She will continue her prescribed pain medicine 

## 2021-07-06 NOTE — Progress Notes (Signed)
[facility] OFFICE PROGRESS NOTE  Patient Care Team: Nolene Ebbs, MD as PCP - General (Internal Medicine)  ASSESSMENT & PLAN:  Uterine cancer [facility]) Her last imaging studies showed no signs of disease She has attained complete response to treatment We discussed the role of long-term treatment with pembrolizumab and she is in agreement to proceed I will continue to scan her every 6-9 months, next imaging study will be done in October of this year  Cancer associated pain Her cancer associated pain is due to left hip fracture and related to her previous treatment She will continue her prescribed pain medicine  Pancytopenia, acquired ([place]) This is due to her treatment She is not symptomatic Observe  No orders of the defined types were placed in this encounter.   All questions were answered. The patient knows to call the clinic with any problems, questions or concerns. The total time spent in the appointment was 20 minutes encounter with patients including review of chart and various tests results, discussions about plan of care and coordination of care plan   [name], MD 07/06/2021 10:54 AM  INTERVAL HISTORY: Please see below for problem oriented charting. she returns for treatment follow-up with pembrolizumab She is doing well Her chronic pain is stable She denies recent abdominal pain or changes in bowel habits No recent bleeding complications from anticoagulation therapy  REVIEW OF SYSTEMS:   Constitutional: Denies fevers, chills or abnormal weight loss Eyes: Denies blurriness of vision Ears, nose, mouth, throat, and face: Denies mucositis or sore throat Respiratory: Denies cough, dyspnea or wheezes Cardiovascular: Denies palpitation, chest discomfort or lower extremity swelling Gastrointestinal:  Denies nausea, heartburn or change in bowel habits Skin: Denies abnormal skin rashes Lymphatics: Denies new lymphadenopathy or easy  bruising Neurological:Denies numbness, tingling or new weaknesses Behavioral/Psych: Mood is stable, no new changes  All other systems were reviewed with the patient and are negative.  I have reviewed the past medical history, past surgical history, social history and family history with the patient and they are unchanged from previous note.  ALLERGIES:  has No Known Allergies.  MEDICATIONS:  Current Outpatient Medications  Medication Sig Dispense Refill   apixaban (ELIQUIS) 2.5 MG TABS tablet Take by mouth 2 (two) times daily.     diclofenac sodium (VOLTAREN) 1 % GEL APPLY 4GRAMS 4 TIMES A DAY AS NEEDED FOR PAINS     gabapentin (NEURONTIN) 300 MG capsule Take 300 mg by mouth 3 (three) times daily.     lidocaine-prilocaine (EMLA) cream Apply 1 application topically daily as needed. 30 g 3   methadone (DOLOPHINE) 10 MG tablet Take 1 tablet (10 mg total) by mouth every 12 (twelve) hours. 60 tablet 0   morphine (MSIR) 15 MG tablet Take 1 tablet (15 mg total) by mouth every 6 (six) hours as needed for severe pain. 60 tablet 0   Olopatadine HCl 0.2 % SOLN Place 1 drop into both eyes daily.     No current facility-administered medications for this visit.   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 Uterus +/- tubes/ovaries, neoplastic, with left fallopian  tube and ovary - INVASIVE ENDOMETRIOID CARCINOMA (1.5 CM), FIGO GRADE II, ARISING IN A BACKGROUND OF ATYPICAL COMPLEX HYPERPLASIA, CONFINED WITHIN INNER HALF OF THE MYOMETRIUM. - ENDOMETRIAL POLYP WITH ASSOCIATED ATYPICAL COMPLEX HYPERPLASIA. - MYOMETRIUM: LEIOMYOMATA. - CERVIX: BENIGN SQUAMOUS MUCOSA AND ENDOCERVICAL MUCOSA, NO DYSPLASIA OR MALIGNANCY. - LEFT OVARY: BENIGN OVARIAN TISSUE WITH ENDOSALPINGOSIS, NO EVIDENCE OF ATYPIA OR MALIGNANCY. - LEFT FALLOPIAN TUBE: NO HISTOLOGIC ABNORMALITIES. - PLEASE SEE ONCOLOGY TEMPLATE FOR DETAIL. 2. Ovary and fallopian tube, right - BENIGN OVARIAN TISSUE WITH ENDOSALPINGOSIS, NO ATYPIA OR MALIGNANCY. - BENIGN FALLOPIAN TUBAL TISSUE, NO PATHOLOGIC ABNORMALITIES. Microscopic Comment 1. UTERUS Specimen: Uterus, cervix, bilateral ovaries and fallopian tubes Procedure: Total hysterectomy and bilateral salpingo-oophorectomy Lymph node sampling performed: No Specimen integrity: Intact Maximum tumor size (cm): 1.5 cm, glass slide measurement Histologic type: Invasive endometrioid carcinoma Grade: FIGO grade II Myometrial invasion: 1 cm where myometrium is 2.3 cm in thickness Cervical stromal involvement: No Extent of involvement of other organs: No Lymph vascular invasion: Not identified Peritoneal washings: Negative ([id number]) Lymph nodes: number examined N/A; number positive N/A TNM code: pT1a, pNX 1 oFf 3IGO Stage (based on pathologic findings, needs clinical correlation): IA  Comments: Sections the endomyometrium away from the grossly identified endometrial polyp show an invasive FIGO grade II endometrioid carcinoma. The tumor is confined within inner half of the myometrium. No angiolymphatic invasion is identified. No cervical stromal involvement is identified. Sections of the grossly identified endometrial polyp show an endometrial polyp with associated atypical compacted hyperplasia with no definitive evidence of carcinoma.    12/07/2017  Imaging   US venous Doppler Right: No evidence of common femoral vein obstruction. Left: Findings consistent with acute deep vein thrombosis involving the left femoral vein, left proximal profunda vein, and left popliteal vein. Unable to adequately interrogate the common femoral and higher, or the calf secondary to significant edema and body habitus    12/07/2017 [facility] Admission   She presented to the ER and was diagnosed with acute DVT    01/18/2018 - 01/21/2018 Hospital Admission   She was admitted to the hospital for management of severe persistent DVT    01/18/2018 Imaging   US venous Doppler Right: No evidence of common femoral vein obstruction. Left: Findings consistent with acute deep vein thrombosis involving the left common femoral vein, and left popliteal vein.    01/19/2018 Surgery   Pre-operative Diagnosis: Subacute DVT with severe post thrombotic syndrome Post-operative diagnosis:  Same Surgeon:  Erlene Quan C. Donzetta Matters, MD Procedure Performed: 1.  Ultrasound-guided cannulation left small saphenous vein 2.  Left lower extremity and central venography 3.  Intravascular ultrasound of left popliteal, femoral, common femoral, external and common iliac veins and IVC 4.  Stent of left common and external iliac veins with 14 x 60 mm Vici 5.  Moderate sedation with fentanyl and Versed for 50 minutes   Indications: 76 year old female with a history of DVT in October now presents with persistent left lower extremity swelling and ultrasound demonstrating likely persistent DVT.  She has been on Xarelto at this time.  She is now indicated for venogram possible intervention.   Findings: Flow in the left lower extremity was stagnant throughout but by venogram  all veins were patent.  There was a focal occlusive area approximately 2 cm in length at the common and external iliac vein junction at the hypogastric on the left.  After stenting and ballooning we had a diameter of 12 millimeters in  the stent and venogram demonstrated flow in the lower extremity veins were previously was stagnant and no further residual stenosis in the left common and external iliac vein junction.    04/12/2018 Imaging   US Venous Doppler Right: No evidence of common femoral vein obstruction. Left: There is no evidence of deep vein thrombosis in the lower extremity. However, portions of this examination were limited- see technologist comments above. Left groin: Large hypoechoic area with mixed echoes noted measuring nearly 10 cm. Possible  hematoma versus unknown etiology. Ultrasound characteristics of enlarged lymph nodes noted in the groin.      05/15/2018 Imaging   US Venous Doppler Right: No evidence of deep vein thrombosis in the lower extremity. No indirect evidence of obstruction proximal to the inguinal ligament. Left: No reflux was noted in the common femoral vein , femoral vein in the thigh, popliteal vein, great saphenous vein at the saphenofemoral junction, great saphenous vein at the proximal thigh, great saphenous vein at the mid thigh, great saphenous vein  at the distal thigh, great saphenous vein at the knee, origin of the small saphenous vein, proximal small saphenous vein, and mid small saphenous vein. There is no evidence of deep vein thrombosis in the lower extremity. There is no evidence of superficial venous thrombosis. No cystic structure found in the popliteal fossa. Unable to evaluate extension of common femoral vein obstruction proximal to the inguinal ligament.    06/01/2018 Imaging   1. 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 Additionally, there is lucency involving the medial sidewall of the left acetabulum with potential nondisplaced pathologic fracture. Further evaluation with contrast-enhanced pelvic MRI could be performed as clinically indicated. 2. The left pelvic arterial  and venous system is encased by this infiltrative left pelvic sidewall mass however while difficult to ascertain, the left external iliac venous stent appears patent.    06/18/2018 Pathology Results   Lymph node for lymphoma, Left Inguinal - METASTATIC ADENOCARCINOMA, SEE COMMENT. Microscopic Comment Immunohistochemistry is positive for cytokeratin 7, PAX8, ER, and PR. Cytokeratin 5/6,and p63 are negative. The immunoprofile along with the patient's history are consistent with a gynecologic primary.    06/18/2018 Surgery   Pre-op Diagnosis: INGUINAL LYMPHADENOPATHY, PELVIC MASS      Procedure(s): EXCISIONAL BIOPSY DEEP LEFT INGUINAL LYMPH NODE   Surgeon(s): Coralie Keens, MD      06/24/2018 Cancer Staging   Staging form: Corpus Uteri - Carcinoma and Carcinosarcoma, AJCC 8th Edition - Clinical: Stage IVB (cT1a, cN2, pM1) - Signed by [name], MD on 06/24/2018     Genetic Testing   Patient has genetic testing done for MMR on pathology from 06/18/2018. Results revealed patient has the following mutation(s): MMR: abnormal    06/29/2018 Procedure   Placement of a subcutaneous port device. Catheter tip at the SVC and right atrium junction.     Genetic Testing   Patient has genetic testing done for MSI on pathology from 06/18/2018. Results revealed patient has the following mutation(s): MSI: High    07/02/2018 PET scan   Previous hysterectomy, with asymmetric focus of hypermetabolic activity in the left vaginal cuff, suspicious for residual or recurrent carcinoma.   Large hypermetabolic  soft tissue mass involving the left pelvic sidewall and acetabulum, consistent with metastatic disease.   No evidence metastatic disease within the abdomen, chest, or neck.    07/09/2018 Tumor Marker   Patient's tumor was tested for the following markers: CA-125 Results of the tumor marker test revealed 9    07/10/2018 - 08/24/2018 Chemotherapy   The patient had carboplatin and taxol x 3  cycles   07/17/2018 Genetic Testing   Negative genetic testing on the common hereditary cancer panel.  The Common Hereditary Gene Panel offered by [facility] includes sequencing and/or deletion duplication testing of the following 48 genes: APC, ATM, AXIN2, BARD1, BMPR1A, BRCA1, BRCA2, BRIP1, CDH1, CDK4, CDKN2A (p14ARF), CDKN2A (p16INK4a), CHEK2, CTNNA1, DICER1, EPCAM (Deletion/duplication testing only), GREM1 (promoter region deletion/duplication testing only), KIT, MEN1, MLH1, MSH2, MSH3, MSH6, MUTYH, NBN, NF1, NHTL1, PALB2, PDGFRA, PMS2, POLD1, POLE, PTEN, RAD50, RAD51C, RAD51D, RNF43, SDHB, SDHC, SDHD, SMAD4, SMARCA4. STK11, TP53, TSC1, TSC2, and VHL.  The following genes were evaluated for sequence changes only: SDHA and HOXB13 c.251G>A variant only. The report date is Jul 17, 2018.     10/03/2018 Imaging   CT abdomen and pelvis 1.  No acute intra-abdominal process. 2. Grossly unchanged left pelvic sidewall mass with osseous involvement of the medial acetabulum. Progressive mild displacement of the associated comminuted pathologic fracture involving the right acetabulum and puboacetabular junction.  3. New venous stents extending from the left common iliac vein origin to the proximal left common femoral vein. The stents are patent.   11/06/2018 -  Chemotherapy   The patient had pembrolizumab for chemotherapy treatment.     01/28/2019 Imaging   1. No substantial interval change in exam. 2. Interval development of mild fullness in the left intrarenal collecting system and ureter without overt hydronephrosis at this time. 3. Abnormal soft tissue along the left pelvic sidewall has decreased slightly in the interval. 4. Similar appearance of ill-defined fascial planes in the pelvis with some peritoneal thickening along the right pelvic sidewall and potentially involving the sigmoid mesocolon. 5. No substantial ascites.   05/03/2019 Imaging   1. Stable mild left pelvic sidewall soft tissue density.  No new or progressive disease identified within the abdomen or pelvis.  2. Colonic diverticulosis. No radiographic evidence of diverticulitis.   Aortic Atherosclerosis (ICD10-I70.0).   09/09/2019 Imaging   1. No change in appearance of soft tissue thickening along the LEFT pelvic sidewall adjacent to chronic LEFT acetabular fracture. 2. Mild asymmetry of the bladder wall favoring the LEFT bladder wall, not well assessed. Similar accounting for variable degrees of distension on prior studies potentially related to prior radiation, attention on follow-up. 3. Signs of venous stenting in the LEFT hemipelvis with LEFT lower extremity muscular atrophy and mild stranding with similar appearance. Signs of colonic diverticulosis and diverticular disease without change.   02/24/2020 Imaging   1. Unchanged appearance of the pelvis as detailed below. 2. Unchanged soft tissue thickening of the left pelvic sidewall. 3. 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 4. No evidence discrete mass or lymphadenopathy nor metastatic disease in the abdomen or pelvis. 5. Status post hysterectomy and cholecystectomy. 6. Left common iliac vein stent. 7. Pancolonic diverticulosis.     09/07/2020 Imaging   Stable abnormal soft tissue density in the left pelvic sidewall. No new or progressive disease within the abdomen or pelvis.   Colonic diverticulosis. No radiographic evidence of diverticulitis.  Stable severe destructive left hip arthropathy with fracture involving the medial acetabular wall.     03/01/2021 Imaging   Stable abnormal soft tissue density in the left pelvic sidewall. 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   No new or progressive disease within the abdomen or pelvis.   Colonic diverticulosis, without radiographic evidence of diverticulitis.   Tiny hiatal hernia.   Metastasis  to lymph nodes ([place])  06/23/2018 Initial Diagnosis   Metastasis to lymph nodes ([place])    07/10/2018 - 08/24/2018 Chemotherapy   The patient had palonosetron (ALOXI) injection 0.25 mg, 0.25 mg, Intravenous,  Once, 3 of 6 cycles Administration: 0.25 mg (07/10/2018), 0.25 mg (07/31/2018), 0.25 mg (08/24/2018) CARBOplatin (PARAPLATIN) 480 mg in sodium chloride 0.9 % 250 mL chemo infusion, 480 mg (100 % of original dose 482.5 mg), Intravenous,  Once, 3 of 6 cycles Dose modification: 482.5 mg (original dose 482.5 mg, Cycle 1) Administration: 480 mg (07/10/2018), 480 mg (07/31/2018), 480 mg (08/24/2018) PACLitaxel (TAXOL) 276 mg in sodium chloride 0.9 % 250 mL chemo infusion (> 66m/m2), 140 mg/m2 = 276 mg (80 % of original dose 175 mg/m2), Intravenous,  Once, 3 of 6 cycles Dose modification: 140 mg/m2 (80 % of original dose 175 mg/m2, Cycle 1, Reason: Dose Not Tolerated) Administration: 276 mg (07/10/2018), 276 mg (07/31/2018), 276 mg (08/24/2018) fosaprepitant (EMEND) 150 mg, dexamethasone (DECADRON) 12 mg in sodium chloride 0.9 % 145 mL IVPB, , Intravenous,  Once, 3 of 6 cycles Administration:  (07/10/2018),  (07/31/2018),  (08/24/2018)   for chemotherapy treatment.     11/06/2018 -  Chemotherapy   The patient had pembrolizumab for chemotherapy treatment.     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 ([place]  06/24/2018 Initial Diagnosis   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 ([place]    07/10/2018 - 08/24/2018 Chemotherapy   The patient had palonosetron (ALOXI) injection 0.25 mg, 0.25 mg, Intravenous,  Once, 3 of 6 cycles Administration: 0.25 mg (07/10/2018), 0.25 mg (07/31/2018), 0.25 mg (08/24/2018) CARBOplatin (PARAPLATIN) 480 mg in sodium chloride 0.9 % 250 mL chemo infusion, 480 mg (100 % of original dose 482.5 mg), Intravenous,  Once, 3 of 6 cycles Dose modification: 482.5 mg (original dose 482.5 mg, Cycle 1) Administration: 480 mg (07/10/2018), 480 mg (07/31/2018), 480 mg (08/24/2018) PACLitaxel (TAXOL) 276 mg in sodium chloride 0.9 % 250 mL chemo infusion  (> 885mm2), 140 mg/m2 = 276 mg (80 % of original dose 175 mg/m2), Intravenous,  Once, 3 of 6 cycles Dose modification: 140 mg/m2 (80 % of original dose 175 mg/m2, Cycle 1, Reason: Dose Not Tolerated) Administration: 276 mg (07/10/2018), 276 mg (07/31/2018), 276 mg (08/24/2018) fosaprepitant (EMEND) 150 mg, dexamethasone (DECADRON) 12 mg in sodium chloride 0.9 % 145 mL IVPB, , Intravenous,  Once, 3 of 6 cycles Administration:  (07/10/2018),  (07/31/2018),  (08/24/2018)   for chemotherapy treatment.     11/06/2018 -  Chemotherapy   The patient had pembrolizumab for chemotherapy treatment.     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       PHYSICAL EXAMINATION: ECOG PERFORMANCE STATUS: 1 - Symptomatic but completely ambulatory  Vitals:   07/06/21 1008  BP: 126/62  Pulse: 93  Resp: 18  Temp: 97.7 F (36.5 C)  SpO2: 100%   Filed Weights   07/06/21 1008  Weight: 236 lb 12.8  oz (107.4 kg)    GENERAL:alert, no distress and comfortable NEURO: alert & oriented x 3 with fluent speech, no focal motor/sensory deficits  LABORATORY DATA:  I have reviewed the data as listed    Component Value Date/Time   NA 139 07/06/2021 0937   K 4.0 07/06/2021 0937   CL 107 07/06/2021 0937   CO2 27 07/06/2021 0937   GLUCOSE 90 07/06/2021 0937   BUN 9 07/06/2021 0937   CREATININE 0.79 07/06/2021 0937   CREATININE 0.57 11/30/2020 1115   CALCIUM 9.1 07/06/2021 0937   PROT 7.4 07/06/2021 0937   ALBUMIN 3.9 07/06/2021 0937   AST 11 (L) 07/06/2021 0937   AST 13 (L) 11/30/2020 1115   ALT 7 07/06/2021 0937   ALT 9 11/30/2020 1115   ALKPHOS 110 07/06/2021 0937   BILITOT 0.7 07/06/2021 0937   BILITOT 0.8 11/30/2020 1115   GFRNONAA >60 07/06/2021 0937    GFRNONAA >60 11/30/2020 1115   GFRAA >60 11/12/2019 1222    No results found for: SPEP, UPEP  Lab Results  Component Value Date   WBC 3.7 (L) 07/06/2021   NEUTROABS 2.2 07/06/2021   HGB 11.1 (L) 07/06/2021   HCT 34.2 (L) 07/06/2021   MCV 87.9 07/06/2021   PLT 217 07/06/2021      Chemistry      Component Value Date/Time   NA 139 07/06/2021 0937   K 4.0 07/06/2021 0937   CL 107 07/06/2021 0937   CO2 27 07/06/2021 0937   BUN 9 07/06/2021 0937   CREATININE 0.79 07/06/2021 0937   CREATININE 0.57 11/30/2020 1115      Component Value Date/Time   CALCIUM 9.1 07/06/2021 0937   ALKPHOS 110 07/06/2021 0937   AST 11 (L) 07/06/2021 0937   AST 13 (L) 11/30/2020 1115   ALT 7 07/06/2021 0937   ALT 9 11/30/2020 1115   BILITOT 0.7 07/06/2021 0937   BILITOT 0.8 11/30/2020 1115

## 2021-07-06 NOTE — Assessment & Plan Note (Signed)
This is due to her treatment She is not symptomatic Observe 

## 2021-07-06 NOTE — Patient Instructions (Signed)
[facility]  Discharge Instructions: Thank you for choosing [facility] to provide your oncology and hematology care.   If you have a lab appointment with the [facility], please go directly to the [facility] and check in at the registration area.   Wear comfortable clothing and clothing appropriate for easy access to any Portacath or PICC line.   We strive to give you quality time with your provider. You may need to reschedule your appointment if you arrive late (15 or more minutes).  Arriving late affects you and other patients whose appointments are after yours.  Also, if you miss three or more appointments without notifying the office, you may be dismissed from the clinic at the provider's discretion.      For prescription refill requests, have your pharmacy contact our office and allow 72 hours for refills to be completed.    Today you received the following chemotherapy and/or immunotherapy agent: Pembrolizumab (Keytruda).   To help prevent nausea and vomiting after your treatment, we encourage you to take your nausea medication as directed.  BELOW ARE SYMPTOMS THAT SHOULD BE REPORTED IMMEDIATELY: *FEVER GREATER THAN 100.4 F (38 C) OR HIGHER *CHILLS OR SWEATING *NAUSEA AND VOMITING THAT IS NOT CONTROLLED WITH YOUR NAUSEA MEDICATION *UNUSUAL SHORTNESS OF BREATH *UNUSUAL BRUISING OR BLEEDING *URINARY PROBLEMS (pain or burning when urinating, or frequent urination) *BOWEL PROBLEMS (unusual diarrhea, constipation, pain near the anus) TENDERNESS IN MOUTH AND THROAT WITH OR WITHOUT PRESENCE OF ULCERS (sore throat, sores in mouth, or a toothache) UNUSUAL RASH, SWELLING OR PAIN  UNUSUAL VAGINAL DISCHARGE OR ITCHING   Items with * indicate a potential emergency and should be followed up as soon as possible or go to the Emergency Department if any problems should occur.  Please show the CHEMOTHERAPY ALERT CARD or IMMUNOTHERAPY ALERT CARD at  check-in to the Emergency Department and triage nurse.  Should you have questions after your visit or need to cancel or reschedule your appointment, please contact [facility]  Dept: [phone]  and follow the prompts.  Office hours are 8:00 a.m. to 4:30 p.m. Monday - Friday. Please note that voicemails left after 4:00 p.m. may not be returned until the following business day.  We are closed weekends and major holidays. You have access to a nurse at all times for urgent questions. Please call the main number to the clinic Dept: [phone] and follow the prompts.   For any non-urgent questions, you may also contact your provider using MyChart. We now offer e-Visits for anyone 76 and older to request care online for non-urgent symptoms. For details visit [email or web address].   Also download the MyChart app! Go to the app store, search "MyChart", open the app, select [facility], and log in with your MyChart username and password.  Due to Covid, a mask is required upon entering the hospital/clinic. If you do not have a mask, one will be given to you upon arrival. For doctor visits, patients may have 1 support person aged 46 or older with them. For treatment visits, patients cannot have anyone with them due to current Covid guidelines and our immunocompromised population.   Pembrolizumab injection What is this medication? PEMBROLIZUMAB (pem broe liz ue mab) is a monoclonal antibody. It is used to treat certain types of cancer. This medicine may be used for other purposes; ask your health care provider or pharmacist if you have questions. COMMON BRAND NAME(S): Keytruda What should I  tell my care team before I take this medication? They need to know if you have any of these conditions: autoimmune diseases like Crohn's disease, ulcerative colitis, or lupus have had or planning to have an allogeneic stem cell transplant (uses someone else's stem cells) history of  organ transplant history of chest radiation nervous system problems like myasthenia gravis or Guillain-Barre syndrome an unusual or allergic reaction to pembrolizumab, other medicines, foods, dyes, or preservatives pregnant or trying to get pregnant breast-feeding How should I use this medication? This medicine is for infusion into a vein. It is given by a health care professional in a hospital or clinic setting. A special MedGuide will be given to you before each treatment. Be sure to read this information carefully each time. Talk to your pediatrician regarding the use of this medicine in children. While this drug may be prescribed for children as young as 6 months for selected conditions, precautions do apply. Overdosage: If you think you have taken too much of this medicine contact a poison control center or emergency room at once. NOTE: This medicine is only for you. Do not share this medicine with others. What if I miss a dose? It is important not to miss your dose. Call your doctor or health care professional if you are unable to keep an appointment. What may interact with this medication? Interactions have not been studied. This list may not describe all possible interactions. Give your health care provider a list of all the medicines, herbs, non-prescription drugs, or dietary supplements you use. Also tell them if you smoke, drink alcohol, or use illegal drugs. Some items may interact with your medicine. What should I watch for while using this medication? Your condition will be monitored carefully while you are receiving this medicine. You may need blood work done while you are taking this medicine. Do not become pregnant while taking this medicine or for 4 months after stopping it. Women should inform their doctor if they wish to become pregnant or think they might be pregnant. There is a potential for serious side effects to an unborn child. Talk to your health care professional or  pharmacist for more information. Do not breast-feed an infant while taking this medicine or for 4 months after the last dose. What side effects may I notice from receiving this medication? Side effects that you should report to your doctor or health care professional as soon as possible: allergic reactions like skin rash, itching or hives, swelling of the face, lips, or tongue bloody or black, tarry breathing problems changes in vision chest pain chills confusion constipation cough diarrhea dizziness or feeling faint or lightheaded fast or irregular heartbeat fever flushing joint pain low blood counts - this medicine may decrease the number of white blood cells, red blood cells and platelets. You may be at increased risk for infections and bleeding. muscle pain muscle weakness pain, tingling, numbness in the hands or feet persistent headache redness, blistering, peeling or loosening of the skin, including inside the mouth signs and symptoms of high blood sugar such as dizziness; dry mouth; dry skin; fruity breath; nausea; stomach pain; increased hunger or thirst; increased urination signs and symptoms of kidney injury like trouble passing urine or change in the amount of urine signs and symptoms of liver injury like dark urine, light-colored stools, loss of appetite, nausea, right upper belly pain, yellowing of the eyes or skin sweating swollen lymph nodes weight loss Side effects that usually do not require medical attention (report   to your doctor or health care professional if they continue or are bothersome): decreased appetite hair loss tiredness This list may not describe all possible side effects. Call your doctor for medical advice about side effects. You may report side effects to FDA at 1-800-FDA-1088. Where should I keep my medication? This drug is given in a hospital or clinic and will not be stored at home. NOTE: This sheet is a summary. It may not cover all possible  information. If you have questions about this medicine, talk to your doctor, pharmacist, or health care provider.  2023 Elsevier/Gold Standard (2021-01-05 00:00:00)

## 2021-07-06 NOTE — Assessment & Plan Note (Signed)
Her last imaging studies showed no signs of disease She has attained complete response to treatment We discussed the role of long-term treatment with pembrolizumab and she is in agreement to proceed I will continue to scan her every 6-9 months, next imaging study will be done in October of this year

## 2021-07-27 ENCOUNTER — Inpatient Hospital Stay (HOSPITAL_BASED_OUTPATIENT_CLINIC_OR_DEPARTMENT_OTHER): Payer: Medicare Other | Admitting: Hematology and Oncology

## 2021-07-27 ENCOUNTER — Inpatient Hospital Stay: Payer: Medicare Other

## 2021-07-27 ENCOUNTER — Other Ambulatory Visit: Payer: Self-pay

## 2021-07-27 ENCOUNTER — Encounter: Payer: Self-pay | Admitting: Hematology and Oncology

## 2021-07-27 ENCOUNTER — Inpatient Hospital Stay: Payer: Medicare Other | Attending: Hematology and Oncology

## 2021-07-27 DIAGNOSIS — D61818 Other pancytopenia: Secondary | ICD-10-CM

## 2021-07-27 DIAGNOSIS — G893 Neoplasm related pain (acute) (chronic): Secondary | ICD-10-CM | POA: Insufficient documentation

## 2021-07-27 DIAGNOSIS — C7951 Secondary malignant neoplasm of bone: Secondary | ICD-10-CM

## 2021-07-27 DIAGNOSIS — I700 Atherosclerosis of aorta: Secondary | ICD-10-CM | POA: Insufficient documentation

## 2021-07-27 DIAGNOSIS — Z79899 Other long term (current) drug therapy: Secondary | ICD-10-CM | POA: Insufficient documentation

## 2021-07-27 DIAGNOSIS — Z7901 Long term (current) use of anticoagulants: Secondary | ICD-10-CM | POA: Insufficient documentation

## 2021-07-27 DIAGNOSIS — Z7189 Other specified counseling: Secondary | ICD-10-CM

## 2021-07-27 DIAGNOSIS — Z5112 Encounter for antineoplastic immunotherapy: Secondary | ICD-10-CM | POA: Insufficient documentation

## 2021-07-27 DIAGNOSIS — C774 Secondary and unspecified malignant neoplasm of inguinal and lower limb lymph nodes: Secondary | ICD-10-CM

## 2021-07-27 DIAGNOSIS — Z86718 Personal history of other venous thrombosis and embolism: Secondary | ICD-10-CM | POA: Insufficient documentation

## 2021-07-27 DIAGNOSIS — C55 Malignant neoplasm of uterus, part unspecified: Secondary | ICD-10-CM

## 2021-07-27 DIAGNOSIS — K449 Diaphragmatic hernia without obstruction or gangrene: Secondary | ICD-10-CM | POA: Diagnosis not present

## 2021-07-27 DIAGNOSIS — R590 Localized enlarged lymph nodes: Secondary | ICD-10-CM | POA: Diagnosis not present

## 2021-07-27 DIAGNOSIS — K5730 Diverticulosis of large intestine without perforation or abscess without bleeding: Secondary | ICD-10-CM | POA: Insufficient documentation

## 2021-07-27 DIAGNOSIS — C801 Malignant (primary) neoplasm, unspecified: Secondary | ICD-10-CM

## 2021-07-27 DIAGNOSIS — E039 Hypothyroidism, unspecified: Secondary | ICD-10-CM

## 2021-07-27 LAB — COMPREHENSIVE METABOLIC PANEL
ALT: 7 U/L (ref 0–44)
AST: 10 U/L — ABNORMAL LOW (ref 15–41)
Albumin: 4 g/dL (ref 3.5–5.0)
Alkaline Phosphatase: 112 U/L (ref 38–126)
Anion gap: 5 (ref 5–15)
BUN: 11 mg/dL (ref 8–23)
CO2: 27 mmol/L (ref 22–32)
Calcium: 9.4 mg/dL (ref 8.9–10.3)
Chloride: 108 mmol/L (ref 98–111)
Creatinine, Ser: 0.73 mg/dL (ref 0.44–1.00)
GFR, Estimated: >60 mL/min (ref >60)
Glucose, Bld: 101 mg/dL — ABNORMAL HIGH (ref 70–99)
Potassium: 3.9 mmol/L (ref 3.5–5.1)
Sodium: 140 mmol/L (ref 135–145)
Total Bilirubin: 0.6 mg/dL (ref 0.3–1.2)
Total Protein: 7.3 g/dL (ref 6.5–8.1)

## 2021-07-27 LAB — CBC WITH DIFFERENTIAL/PLATELET
Abs Immature Granulocytes: 0.01 10*3/uL (ref 0.00–0.07)
Basophils Absolute: 0 10*3/uL (ref 0.0–0.1)
Basophils Relative: 0 %
Eosinophils Absolute: 0.1 10*3/uL (ref 0.0–0.5)
Eosinophils Relative: 2 %
HCT: 33 % — ABNORMAL LOW (ref 36.0–46.0)
Hemoglobin: 10.7 g/dL — ABNORMAL LOW (ref 12.0–15.0)
Immature Granulocytes: 0 %
Lymphocytes Relative: 23 %
Lymphs Abs: 0.8 10*3/uL (ref 0.7–4.0)
MCH: 28.8 pg (ref 26.0–34.0)
MCHC: 32.4 g/dL (ref 30.0–36.0)
MCV: 88.7 fL (ref 80.0–100.0)
Monocytes Absolute: 0.5 10*3/uL (ref 0.1–1.0)
Monocytes Relative: 13 %
Neutro Abs: 2.3 10*3/uL (ref 1.7–7.7)
Neutrophils Relative %: 62 %
Platelets: 193 10*3/uL (ref 150–400)
RBC: 3.72 MIL/uL — ABNORMAL LOW (ref 3.87–5.11)
RDW: 14.5 % (ref 11.5–15.5)
WBC: 3.7 10*3/uL — ABNORMAL LOW (ref 4.0–10.5)
nRBC: 0 % (ref 0.0–0.2)

## 2021-07-27 LAB — TSH: TSH: 0.103 u[IU]/mL — ABNORMAL LOW (ref 0.350–4.500)

## 2021-07-27 MED ORDER — SODIUM CHLORIDE 0.9 % IV SOLN: Freq: Once | INTRAVENOUS | Status: AC

## 2021-07-27 MED ORDER — SODIUM CHLORIDE 0.9% FLUSH
10.0000 mL | Freq: Once | INTRAVENOUS | Status: AC
  Administered 2021-07-27: 10 mL

## 2021-07-27 MED ORDER — SODIUM CHLORIDE 0.9% FLUSH
10.0000 mL | INTRAVENOUS | Status: DC | PRN
  Administered 2021-07-27: 10 mL

## 2021-07-27 MED ORDER — HEPARIN SOD (PORK) LOCK FLUSH 100 UNIT/ML IV SOLN
500.0000 [IU] | Freq: Once | INTRAVENOUS | Status: AC | PRN
  Administered 2021-07-27: 500 [IU]

## 2021-07-27 MED ORDER — SODIUM CHLORIDE 0.9 % IV SOLN
200.0000 mg | Freq: Once | INTRAVENOUS | Status: AC
  Administered 2021-07-27: 200 mg via INTRAVENOUS
  Filled 2021-07-27: qty 8

## 2021-07-27 NOTE — Assessment & Plan Note (Signed)
Her cancer associated pain is due to left hip fracture and related to her previous treatment She will continue her prescribed pain medicine 

## 2021-07-27 NOTE — Assessment & Plan Note (Signed)
This is due to her treatment She is not symptomatic Observe 

## 2021-07-27 NOTE — Patient Instructions (Signed)
[facility]  Discharge Instructions: °Thank you for choosing [facility] to provide your oncology and hematology care.  ° °If you have a lab appointment with the Cancer Center, please go directly to the Cancer Center and check in at the registration area. °  °Wear comfortable clothing and clothing appropriate for easy access to any Portacath or PICC line.  ° °We strive to give you quality time with your provider. You may need to reschedule your appointment if you arrive late (15 or more minutes).  Arriving late affects you and other patients whose appointments are after yours.  Also, if you miss three or more appointments without notifying the office, you may be dismissed from the clinic at the provider’s discretion.    °  °For prescription refill requests, have your pharmacy contact our office and allow 72 hours for refills to be completed.   ° °Today you received the following chemotherapy and/or immunotherapy agent: Pembrolizumab (Keytruda) °  °To help prevent nausea and vomiting after your treatment, we encourage you to take your nausea medication as directed. ° °BELOW ARE SYMPTOMS THAT SHOULD BE REPORTED IMMEDIATELY: °*FEVER GREATER THAN 100.4 F (38 °C) OR HIGHER °*CHILLS OR SWEATING °*NAUSEA AND VOMITING THAT IS NOT CONTROLLED WITH YOUR NAUSEA MEDICATION °*UNUSUAL SHORTNESS OF BREATH °*UNUSUAL BRUISING OR BLEEDING °*URINARY PROBLEMS (pain or burning when urinating, or frequent urination) °*BOWEL PROBLEMS (unusual diarrhea, constipation, pain near the anus) °TENDERNESS IN MOUTH AND THROAT WITH OR WITHOUT PRESENCE OF ULCERS (sore throat, sores in mouth, or a toothache) °UNUSUAL RASH, SWELLING OR PAIN  °UNUSUAL VAGINAL DISCHARGE OR ITCHING  ° °Items with * indicate a potential emergency and should be followed up as soon as possible or go to the Emergency Department if any problems should occur. ° °Please show the CHEMOTHERAPY ALERT CARD or IMMUNOTHERAPY ALERT CARD at  check-in to the Emergency Department and triage nurse. ° °Should you have questions after your visit or need to cancel or reschedule your appointment, please contact [facility]  Dept: [phone]  and follow the prompts.  Office hours are 8:00 a.m. to 4:30 p.m. Monday - Friday. Please note that voicemails left after 4:00 p.m. may not be returned until the following business day.  We are closed weekends and major holidays. You have access to a nurse at all times for urgent questions. Please call the main number to the clinic Dept: [phone] and follow the prompts. ° ° °For any non-urgent questions, you may also contact your provider using MyChart. We now offer e-Visits for anyone 18 and older to request care online for non-urgent symptoms. For details visit [email or web address]. °  °Also download the MyChart app! Go to the app store, search "MyChart", open the app, select , and log in with your MyChart username and password. ° °Due to Covid, a mask is required upon entering the hospital/clinic. If you do not have a mask, one will be given to you upon arrival. For doctor visits, patients may have 1 support person aged 18 or older with them. For treatment visits, patients cannot have anyone with them due to current Covid guidelines and our immunocompromised population.  ° °

## 2021-07-27 NOTE — Assessment & Plan Note (Signed)
Her last imaging studies showed no signs of disease She has attained complete response to treatment We discussed the role of long-term treatment with pembrolizumab and she is in agreement to proceed We also discussed the timing of next imaging The patient has expressed concern that she might want to consider discontinuing treatment at the end of the year In that case, I plan to repeat imaging study in December

## 2021-07-27 NOTE — Progress Notes (Signed)
[place] OFFICE PROGRESS NOTE  Patient Care Team: Loura Pardon, MD as PCP - General (Family Medicine)  ASSESSMENT & PLAN:  Uterine cancer [facility]) Her last imaging studies showed no signs of disease She has attained complete response to treatment We discussed the role of long-term treatment with pembrolizumab and she is in agreement to proceed We also discussed the timing of next imaging The patient has expressed concern that she might want to consider discontinuing treatment at the end of the year In that case, I plan to repeat imaging study in December  Cancer associated pain Her cancer associated pain is due to left hip fracture and related to her previous treatment She will continue her prescribed pain medicine  Pancytopenia, acquired ([place]) This is due to her treatment She is not symptomatic Observe  No orders of the defined types were placed in this encounter.   All questions were answered. The patient knows to call the clinic with any problems, questions or concerns. The total time spent in the appointment was 20 minutes encounter with patients including review of chart and various tests results, discussions about plan of care and coordination of care plan   [name], MD 07/27/2021 10:35 AM  INTERVAL HISTORY: Please see below for problem oriented charting. she returns for treatment follow-up on maintenance pembrolizumab for recurrent uterine cancer She is doing well Her pain is well controlled She denies recent bleeding She has no abdominal symptoms or signs and symptoms to suggest cancer recurrence  REVIEW OF SYSTEMS:   Constitutional: Denies fevers, chills or abnormal weight loss Eyes: Denies blurriness of vision Ears, nose, mouth, throat, and face: Denies mucositis or sore throat Respiratory: Denies cough, dyspnea or wheezes Cardiovascular: Denies palpitation, chest discomfort or lower extremity swelling Gastrointestinal:  Denies nausea,  heartburn or change in bowel habits Skin: Denies abnormal skin rashes Lymphatics: Denies new lymphadenopathy or easy bruising Neurological:Denies numbness, tingling or new weaknesses Behavioral/Psych: Mood is stable, no new changes  All other systems were reviewed with the patient and are negative.  I have reviewed the past medical history, past surgical history, social history and family history with the patient and they are unchanged from previous note.  ALLERGIES:  has No Known Allergies.  MEDICATIONS:  Current Outpatient Medications  Medication Sig Dispense Refill   apixaban (ELIQUIS) 2.5 MG TABS tablet Take by mouth 2 (two) times daily.     diclofenac sodium (VOLTAREN) 1 % GEL APPLY 4GRAMS 4 TIMES A DAY AS NEEDED FOR PAINS     gabapentin (NEURONTIN) 300 MG capsule Take 300 mg by mouth 3 (three) times daily.     lidocaine-prilocaine (EMLA) cream Apply 1 application topically daily as needed. 30 g 3   methadone (DOLOPHINE) 10 MG tablet Take 1 tablet (10 mg total) by mouth every 12 (twelve) hours. 60 tablet 0   morphine (MSIR) 15 MG tablet Take 1 tablet (15 mg total) by mouth every 6 (six) hours as needed for severe pain. 60 tablet 0   Olopatadine HCl 0.2 % SOLN Place 1 drop into both eyes daily.     No current facility-administered medications for this visit.    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 Uterus +/- tubes/ovaries, neoplastic, with left fallopian tube and ovary - INVASIVE ENDOMETRIOID CARCINOMA (1.5 CM),  FIGO GRADE II, ARISING IN A BACKGROUND OF ATYPICAL COMPLEX HYPERPLASIA, CONFINED WITHIN INNER HALF OF THE MYOMETRIUM. - ENDOMETRIAL POLYP WITH ASSOCIATED ATYPICAL COMPLEX HYPERPLASIA. - MYOMETRIUM: LEIOMYOMATA. - CERVIX: BENIGN SQUAMOUS MUCOSA AND  ENDOCERVICAL MUCOSA, NO DYSPLASIA OR MALIGNANCY. - LEFT OVARY: BENIGN OVARIAN TISSUE WITH ENDOSALPINGOSIS, NO EVIDENCE OF ATYPIA OR MALIGNANCY. - LEFT FALLOPIAN TUBE: NO HISTOLOGIC ABNORMALITIES. - PLEASE SEE ONCOLOGY TEMPLATE FOR DETAIL. 2. Ovary and fallopian tube, right - BENIGN OVARIAN TISSUE WITH ENDOSALPINGOSIS, NO ATYPIA OR MALIGNANCY. - BENIGN FALLOPIAN TUBAL TISSUE, NO PATHOLOGIC ABNORMALITIES. Microscopic Comment 1. UTERUS Specimen: Uterus, cervix, bilateral ovaries and fallopian tubes Procedure: Total hysterectomy and bilateral salpingo-oophorectomy Lymph node sampling performed: No Specimen integrity: Intact Maximum tumor size (cm): 1.5 cm, glass slide measurement Histologic type: Invasive endometrioid carcinoma Grade: FIGO grade II Myometrial invasion: 1 cm where myometrium is 2.3 cm in thickness Cervical stromal involvement: No Extent of involvement of other organs: No Lymph vascular invasion: Not identified Peritoneal washings: Negative ([id number]) Lymph nodes: number examined N/A; number positive N/A TNM code: pT1a, pNX 1 oFf 3IGO Stage (based on pathologic findings, needs clinical correlation): IA  Comments: Sections the endomyometrium away from the grossly identified endometrial polyp show an invasive FIGO grade II endometrioid carcinoma. The tumor is confined within inner half of the myometrium. No angiolymphatic invasion is identified. No cervical stromal involvement is identified. Sections of the grossly identified endometrial polyp show an endometrial polyp with associated atypical compacted hyperplasia with no definitive evidence of carcinoma.   12/07/2017 Imaging   US venous Doppler Right: No evidence of common femoral vein obstruction. Left: Findings consistent with acute deep vein thrombosis involving the left femoral vein, left proximal profunda vein, and left popliteal vein. Unable to adequately interrogate the common femoral and higher, or the calf  secondary to significant edema and body habitus   12/07/2017 [facility] Admission   She presented to the ER and was diagnosed with acute DVT   01/18/2018 - 01/21/2018 Hospital Admission   She was admitted to the hospital for management of severe persistent DVT   01/18/2018 Imaging   US venous Doppler Right: No evidence of common femoral vein obstruction. Left: Findings consistent with acute deep vein thrombosis involving the left common femoral vein, and left popliteal vein.   01/19/2018 Surgery   Pre-operative Diagnosis: Subacute DVT with severe post thrombotic syndrome Post-operative diagnosis:  Same Surgeon:  Erlene Quan C. Donzetta Matters, MD Procedure Performed: 1.  Ultrasound-guided cannulation left small saphenous vein 2.  Left lower extremity and central venography 3.  Intravascular ultrasound of left popliteal, femoral, common femoral, external and common iliac veins and IVC 4.  Stent of left common and external iliac veins with 14 x 60 mm Vici 5.  Moderate sedation with fentanyl and Versed for 50 minutes   Indications: 76 year old female with a history of DVT in October now presents with persistent left lower extremity swelling and ultrasound demonstrating likely persistent DVT.  She has been on Xarelto at this time.  She is now indicated for venogram possible intervention.   Findings: Flow in the left lower extremity was stagnant throughout but by venogram all veins were patent.  There was a focal occlusive area approximately 2 cm in length at the common and external iliac vein junction at the hypogastric on the left.  After stenting and ballooning we had a diameter of 12 millimeters in the stent and venogram demonstrated flow in the lower extremity veins were previously was stagnant and no further residual stenosis in  the left common and external iliac vein junction.   04/12/2018 Imaging   US Venous Doppler Right: No evidence of common femoral vein obstruction. Left: There is no evidence of  deep vein thrombosis in the lower extremity. However, portions of this examination were limited- see technologist comments above. Left groin: Large hypoechoic area with mixed echoes noted measuring nearly 10 cm. Possible  hematoma versus unknown etiology. Ultrasound characteristics of enlarged lymph nodes noted in the groin.      05/15/2018 Imaging   US Venous Doppler Right: No evidence of deep vein thrombosis in the lower extremity. No indirect evidence of obstruction proximal to the inguinal ligament. Left: No reflux was noted in the common femoral vein , femoral vein in the thigh, popliteal vein, great saphenous vein at the saphenofemoral junction, great saphenous vein at the proximal thigh, great saphenous vein at the mid thigh, great saphenous vein  at the distal thigh, great saphenous vein at the knee, origin of the small saphenous vein, proximal small saphenous vein, and mid small saphenous vein. There is no evidence of deep vein thrombosis in the lower extremity. There is no evidence of superficial venous thrombosis. No cystic structure found in the popliteal fossa. Unable to evaluate extension of common femoral vein obstruction proximal to the inguinal ligament.   06/01/2018 Imaging   1. 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 Additionally, there is lucency involving the medial sidewall of the left acetabulum with potential nondisplaced pathologic fracture. Further evaluation with contrast-enhanced pelvic MRI could be performed as clinically indicated. 2. The left pelvic arterial and venous system is encased by this infiltrative left pelvic sidewall mass however while difficult to ascertain, the left external iliac venous stent appears patent.   06/18/2018 Pathology Results   Lymph node for lymphoma, Left Inguinal - METASTATIC ADENOCARCINOMA, SEE COMMENT. Microscopic  Comment Immunohistochemistry is positive for cytokeratin 7, PAX8, ER, and PR. Cytokeratin 5/6,and p63 are negative. The immunoprofile along with the patient's history are consistent with a gynecologic primary.   06/18/2018 Surgery   Pre-op Diagnosis: INGUINAL LYMPHADENOPATHY, PELVIC MASS      Procedure(s): EXCISIONAL BIOPSY DEEP LEFT INGUINAL LYMPH NODE   Surgeon(s): Coralie Keens, MD      06/24/2018 Cancer Staging   Staging form: Corpus Uteri - Carcinoma and Carcinosarcoma, AJCC 8th Edition - Clinical: Stage IVB (cT1a, cN2, pM1) - Signed by [name], MD on 06/24/2018    Genetic Testing   Patient has genetic testing done for MMR on pathology from 06/18/2018. Results revealed patient has the following mutation(s): MMR: abnormal   06/29/2018 Procedure   Placement of a subcutaneous port device. Catheter tip at the SVC and right atrium junction.    Genetic Testing   Patient has genetic testing done for MSI on pathology from 06/18/2018. Results revealed patient has the following mutation(s): MSI: High   07/02/2018 PET scan   Previous hysterectomy, with asymmetric focus of hypermetabolic activity in the left vaginal cuff, suspicious for residual or recurrent carcinoma.   Large hypermetabolic soft tissue mass involving the left pelvic sidewall and acetabulum, consistent with metastatic disease.   No evidence metastatic disease within the abdomen, chest, or neck.   07/09/2018 Tumor Marker   Patient's tumor was tested for the following markers: CA-125 Results of the tumor marker test revealed 9   07/10/2018 - 08/24/2018 Chemotherapy   The patient had carboplatin and taxol x 3 cycles   07/17/2018 Genetic Testing  Negative genetic testing on the common hereditary cancer panel.  The Common Hereditary Gene Panel offered by [facility] includes sequencing and/or deletion duplication testing of the following 48 genes: APC, ATM, AXIN2, BARD1, BMPR1A, BRCA1, BRCA2, BRIP1, CDH1, CDK4, CDKN2A  (p14ARF), CDKN2A (p16INK4a), CHEK2, CTNNA1, DICER1, EPCAM (Deletion/duplication testing only), GREM1 (promoter region deletion/duplication testing only), KIT, MEN1, MLH1, MSH2, MSH3, MSH6, MUTYH, NBN, NF1, NHTL1, PALB2, PDGFRA, PMS2, POLD1, POLE, PTEN, RAD50, RAD51C, RAD51D, RNF43, SDHB, SDHC, SDHD, SMAD4, SMARCA4. STK11, TP53, TSC1, TSC2, and VHL.  The following genes were evaluated for sequence changes only: SDHA and HOXB13 c.251G>A variant only. The report date is Jul 17, 2018.    10/03/2018 Imaging   CT abdomen and pelvis 1.  No acute intra-abdominal process. 2. Grossly unchanged left pelvic sidewall mass with osseous involvement of the medial acetabulum. Progressive mild displacement of the associated comminuted pathologic fracture involving the right acetabulum and puboacetabular junction.  3. New venous stents extending from the left common iliac vein origin to the proximal left common femoral vein. The stents are patent.   11/06/2018 -  Chemotherapy   The patient had pembrolizumab for chemotherapy treatment.     01/28/2019 Imaging   1. No substantial interval change in exam. 2. Interval development of mild fullness in the left intrarenal collecting system and ureter without overt hydronephrosis at this time. 3. Abnormal soft tissue along the left pelvic sidewall has decreased slightly in the interval. 4. Similar appearance of ill-defined fascial planes in the pelvis with some peritoneal thickening along the right pelvic sidewall and potentially involving the sigmoid mesocolon. 5. No substantial ascites.   05/03/2019 Imaging   1. Stable mild left pelvic sidewall soft tissue density. No new or progressive disease identified within the abdomen or pelvis.  2. Colonic diverticulosis. No radiographic evidence of diverticulitis.   Aortic Atherosclerosis (ICD10-I70.0).   09/09/2019 Imaging   1. No change in appearance of soft tissue thickening along the LEFT pelvic sidewall adjacent to  chronic LEFT acetabular fracture. 2. Mild asymmetry of the bladder wall favoring the LEFT bladder wall, not well assessed. Similar accounting for variable degrees of distension on prior studies potentially related to prior radiation, attention on follow-up. 3. Signs of venous stenting in the LEFT hemipelvis with LEFT lower extremity muscular atrophy and mild stranding with similar appearance. Signs of colonic diverticulosis and diverticular disease without change.   02/24/2020 Imaging   1. Unchanged appearance of the pelvis as detailed below. 2. Unchanged soft tissue thickening of the left pelvic sidewall. 3. 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 4. No evidence discrete mass or lymphadenopathy nor metastatic disease in the abdomen or pelvis. 5. Status post hysterectomy and cholecystectomy. 6. Left common iliac vein stent. 7. Pancolonic diverticulosis.     09/07/2020 Imaging   Stable abnormal soft tissue density in the left pelvic sidewall. No new or progressive disease within the abdomen or pelvis.   Colonic diverticulosis. No radiographic evidence of diverticulitis.   Stable severe destructive left hip arthropathy with fracture involving the medial acetabular wall.     03/01/2021 Imaging   Stable abnormal soft tissue density in the left pelvic sidewall. 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   No new or progressive disease within the abdomen or pelvis.   Colonic diverticulosis, without radiographic evidence of diverticulitis.   Tiny hiatal hernia.   Metastasis to lymph nodes ([place])  06/23/2018 Initial  Diagnosis   Metastasis to lymph nodes ([place])   07/10/2018 - 08/24/2018 Chemotherapy   The patient had palonosetron (ALOXI) injection 0.25 mg, 0.25 mg, Intravenous,  Once, 3 of 6 cycles Administration: 0.25 mg (07/10/2018), 0.25 mg (07/31/2018), 0.25 mg  (08/24/2018) CARBOplatin (PARAPLATIN) 480 mg in sodium chloride 0.9 % 250 mL chemo infusion, 480 mg (100 % of original dose 482.5 mg), Intravenous,  Once, 3 of 6 cycles Dose modification: 482.5 mg (original dose 482.5 mg, Cycle 1) Administration: 480 mg (07/10/2018), 480 mg (07/31/2018), 480 mg (08/24/2018) PACLitaxel (TAXOL) 276 mg in sodium chloride 0.9 % 250 mL chemo infusion (> 40m/m2), 140 mg/m2 = 276 mg (80 % of original dose 175 mg/m2), Intravenous,  Once, 3 of 6 cycles Dose modification: 140 mg/m2 (80 % of original dose 175 mg/m2, Cycle 1, Reason: Dose Not Tolerated) Administration: 276 mg (07/10/2018), 276 mg (07/31/2018), 276 mg (08/24/2018) fosaprepitant (EMEND) 150 mg, dexamethasone (DECADRON) 12 mg in sodium chloride 0.9 % 145 mL IVPB, , Intravenous,  Once, 3 of 6 cycles Administration:  (07/10/2018),  (07/31/2018),  (08/24/2018)  for chemotherapy treatment.    11/06/2018 -  Chemotherapy   The patient had pembrolizumab for chemotherapy treatment.     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  06/24/[place] Initial Diagnosis   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   07/10/2018 - 08/24/2018 Chemotherapy   The patient had palonosetron (ALOXI) injection 0.25 mg, 0.25 mg, Intravenous,  Once, 3 of 6 cycles Administration: 0.25 mg (07/10/2018), 0.25 mg (07/31/2018), 0.25 mg (08/24/2018) CARBOplatin (PARAPLATIN) 480 mg in sodium chloride 0.9 % 250 mL chemo infusion, 480 mg (100 % of original dose 482.5 mg), Intravenous,  Once, 3 of 6 cycles Dose modification: 482.5 mg (original dose 482.5 mg, Cycle 1) Administration: 480 mg (07/10/2018), 480 mg (07/31/2018), 480 mg (08/24/2018) PACLitaxel (TAXOL) 276 mg in sodium chloride 0.9 % 250 mL chemo infusion (> 878mm2), 140 mg/m2 = 276 mg (80 % of original dose 175 mg/m2), Intravenous,  Once, 3 of 6 cycles Dose modification: 140 mg/m2 (80 % of original dose 175 mg/m2, Cycle 1, Reason: Dose Not Tolerated) Administration: 276 mg (07/10/2018), 276 mg (07/31/2018), 276 mg (08/24/2018) fosaprepitant (EMEND)  150 mg, dexamethasone (DECADRON) 12 mg in sodium chloride 0.9 % 145 mL IVPB, , Intravenous,  Once, 3 of 6 cycles Administration:  (07/10/2018),  (07/31/2018),  (08/24/2018)  for chemotherapy treatment.    11/06/2018 -  Chemotherapy   The patient had pembrolizumab for chemotherapy treatment.     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       PHYSICAL EXAMINATION: ECOG PERFORMANCE STATUS: 1 - Symptomatic but completely ambulatory  Vitals:   07/27/21 1009  BP: (!) 145/67  Pulse: 100  Resp: 18  Temp: 98 F (36.7 C)  SpO2: 95%   Filed Weights   07/27/21 1009  Weight: 236 lb (107 kg)    GENERAL:alert, no distress and comfortable NEURO: alert & oriented x 3 with fluent speech, no focal motor/sensory deficits  LABORATORY DATA:  I have reviewed the data as listed    Component Value Date/Time   NA 140 07/27/2021 0954   K 3.9 07/27/2021 0954   CL 108 07/27/2021 0954   CO2 27 07/27/2021 0954   GLUCOSE 101 (H) 07/27/2021 0954   BUN 11 07/27/2021 0954   CREATININE 0.73 07/27/2021 0954  CREATININE 0.57 11/30/2020 1115   CALCIUM 9.4 07/27/2021 0954   PROT 7.3 07/27/2021 0954   ALBUMIN 4.0 07/27/2021 0954   AST 10 (L) 07/27/2021 0954   AST 13 (L) 11/30/2020 1115   ALT 7 07/27/2021 0954   ALT 9 11/30/2020 1115   ALKPHOS 112 07/27/2021 0954   BILITOT 0.6 07/27/2021 0954   BILITOT 0.8 11/30/2020 1115   GFRNONAA >60 07/27/2021 0954   GFRNONAA >60 11/30/2020 1115   GFRAA >60 11/12/2019 1222    No results found for: "SPEP", "UPEP"  Lab Results  Component Value Date   WBC 3.7 (L) 07/27/2021   NEUTROABS 2.3 07/27/2021   HGB 10.7 (L) 07/27/2021   HCT 33.0 (L) 07/27/2021   MCV 88.7 07/27/2021   PLT 193 07/27/2021       Chemistry      Component Value Date/Time   NA 140 07/27/2021 0954   K 3.9 07/27/2021 0954   CL 108 07/27/2021 0954   CO2 27 07/27/2021 0954   BUN 11 07/27/2021 0954   CREATININE 0.73 07/27/2021 0954   CREATININE 0.57 11/30/2020 1115      Component Value Date/Time   CALCIUM 9.4 07/27/2021 0954   ALKPHOS 112 07/27/2021 0954   AST 10 (L) 07/27/2021 0954   AST 13 (L) 11/30/2020 1115   ALT 7 07/27/2021 0954   ALT 9 11/30/2020 1115   BILITOT 0.6 07/27/2021 0954   BILITOT 0.8 11/30/2020 1115

## 2021-08-17 ENCOUNTER — Inpatient Hospital Stay: Payer: Medicare Other

## 2021-08-17 ENCOUNTER — Inpatient Hospital Stay (HOSPITAL_BASED_OUTPATIENT_CLINIC_OR_DEPARTMENT_OTHER): Payer: Medicare Other | Admitting: Hematology and Oncology

## 2021-08-17 ENCOUNTER — Other Ambulatory Visit: Payer: Self-pay

## 2021-08-17 ENCOUNTER — Encounter: Payer: Self-pay | Admitting: Hematology and Oncology

## 2021-08-17 VITALS — Temp 98.0°F

## 2021-08-17 DIAGNOSIS — D61818 Other pancytopenia: Secondary | ICD-10-CM

## 2021-08-17 DIAGNOSIS — C55 Malignant neoplasm of uterus, part unspecified: Secondary | ICD-10-CM

## 2021-08-17 DIAGNOSIS — G893 Neoplasm related pain (acute) (chronic): Secondary | ICD-10-CM

## 2021-08-17 DIAGNOSIS — C774 Secondary and unspecified malignant neoplasm of inguinal and lower limb lymph nodes: Secondary | ICD-10-CM

## 2021-08-17 DIAGNOSIS — C801 Malignant (primary) neoplasm, unspecified: Secondary | ICD-10-CM

## 2021-08-17 DIAGNOSIS — Z7189 Other specified counseling: Secondary | ICD-10-CM

## 2021-08-17 DIAGNOSIS — E039 Hypothyroidism, unspecified: Secondary | ICD-10-CM

## 2021-08-17 DIAGNOSIS — C7951 Secondary malignant neoplasm of bone: Secondary | ICD-10-CM

## 2021-08-17 LAB — CBC WITH DIFFERENTIAL/PLATELET
Abs Immature Granulocytes: 0.01 10*3/uL (ref 0.00–0.07)
Basophils Absolute: 0 10*3/uL (ref 0.0–0.1)
Basophils Relative: 0 %
Eosinophils Absolute: 0.1 10*3/uL (ref 0.0–0.5)
Eosinophils Relative: 3 %
HCT: 32.2 % — ABNORMAL LOW (ref 36.0–46.0)
Hemoglobin: 10.7 g/dL — ABNORMAL LOW (ref 12.0–15.0)
Immature Granulocytes: 0 %
Lymphocytes Relative: 26 %
Lymphs Abs: 0.8 10*3/uL (ref 0.7–4.0)
MCH: 29.2 pg (ref 26.0–34.0)
MCHC: 33.2 g/dL (ref 30.0–36.0)
MCV: 88 fL (ref 80.0–100.0)
Monocytes Absolute: 0.4 10*3/uL (ref 0.1–1.0)
Monocytes Relative: 13 %
Neutro Abs: 1.8 10*3/uL (ref 1.7–7.7)
Neutrophils Relative %: 58 %
Platelets: 205 10*3/uL (ref 150–400)
RBC: 3.66 MIL/uL — ABNORMAL LOW (ref 3.87–5.11)
RDW: 14.5 % (ref 11.5–15.5)
WBC: 3.2 10*3/uL — ABNORMAL LOW (ref 4.0–10.5)
nRBC: 0 % (ref 0.0–0.2)

## 2021-08-17 LAB — COMPREHENSIVE METABOLIC PANEL
ALT: 6 U/L (ref 0–44)
AST: 10 U/L — ABNORMAL LOW (ref 15–41)
Albumin: 3.8 g/dL (ref 3.5–5.0)
Alkaline Phosphatase: 103 U/L (ref 38–126)
Anion gap: 5 (ref 5–15)
BUN: 11 mg/dL (ref 8–23)
CO2: 26 mmol/L (ref 22–32)
Calcium: 9.1 mg/dL (ref 8.9–10.3)
Chloride: 108 mmol/L (ref 98–111)
Creatinine, Ser: 0.71 mg/dL (ref 0.44–1.00)
GFR, Estimated: >60 mL/min (ref >60)
Glucose, Bld: 90 mg/dL (ref 70–99)
Potassium: 4.2 mmol/L (ref 3.5–5.1)
Sodium: 139 mmol/L (ref 135–145)
Total Bilirubin: 0.4 mg/dL (ref 0.3–1.2)
Total Protein: 7.1 g/dL (ref 6.5–8.1)

## 2021-08-17 MED ORDER — SODIUM CHLORIDE 0.9 % IV SOLN
200.0000 mg | Freq: Once | INTRAVENOUS | Status: AC
  Administered 2021-08-17: 200 mg via INTRAVENOUS
  Filled 2021-08-17: qty 200

## 2021-08-17 MED ORDER — SODIUM CHLORIDE 0.9% FLUSH
10.0000 mL | INTRAVENOUS | Status: DC | PRN
  Administered 2021-08-17: 10 mL

## 2021-08-17 MED ORDER — HEPARIN SOD (PORK) LOCK FLUSH 100 UNIT/ML IV SOLN
500.0000 [IU] | Freq: Once | INTRAVENOUS | Status: AC | PRN
  Administered 2021-08-17: 500 [IU]

## 2021-08-17 MED ORDER — SODIUM CHLORIDE 0.9 % IV SOLN: Freq: Once | INTRAVENOUS | Status: AC

## 2021-08-17 MED ORDER — SODIUM CHLORIDE 0.9% FLUSH
10.0000 mL | Freq: Once | INTRAVENOUS | Status: AC
  Administered 2021-08-17: 10 mL

## 2021-08-17 NOTE — Assessment & Plan Note (Signed)
Her last imaging studies showed no signs of disease She has attained complete response to treatment We discussed the role of long-term treatment with pembrolizumab and she is in agreement to proceed We also discussed the timing of next imaging The patient has expressed concern that she might want to consider discontinuing treatment at the end of the year In that case, I plan to repeat imaging study in December We discussed the role of changing her treatment from 200 mg dose of 400 mg every 6 weeks and she is in agreement We will proceed with 200 mg dose today but when she returns in 3 weeks, she will be switched to 400 mg every 6 weeks I will see her in September for further follow-up

## 2021-08-17 NOTE — Assessment & Plan Note (Signed)
Her cancer associated pain is due to left hip fracture and related to her previous treatment She will continue her prescribed pain medicine

## 2021-08-17 NOTE — Progress Notes (Signed)
[place] OFFICE PROGRESS NOTE  Patient Care Team: Loura Pardon, MD as PCP - General (Family Medicine)  ASSESSMENT & PLAN:  Uterine cancer [facility]) Her last imaging studies showed no signs of disease She has attained complete response to treatment We discussed the role of long-term treatment with pembrolizumab and she is in agreement to proceed We also discussed the timing of next imaging The patient has expressed concern that she might want to consider discontinuing treatment at the end of the year In that case, I plan to repeat imaging study in December We discussed the role of changing her treatment from 200 mg dose of 400 mg every 6 weeks and she is in agreement We will proceed with 200 mg dose today but when she returns in 3 weeks, she will be switched to 400 mg every 6 weeks I will see her in September for further follow-up  Cancer associated pain Her cancer associated pain is due to left hip fracture and related to her previous treatment She will continue her prescribed pain medicine  Pancytopenia, acquired ([place]) This is due to her treatment She is not symptomatic Observe  No orders of the defined types were placed in this encounter.   All questions were answered. The patient knows to call the clinic with any problems, questions or concerns. The total time spent in the appointment was 20 minutes encounter with patients including review of chart and various tests results, discussions about plan of care and coordination of care plan   [name], MD 08/17/2021 11:45 AM  INTERVAL HISTORY: Please see below for problem oriented charting. she returns for treatment follow-up on maintenance pembrolizumab for uterine cancer She is doing well She have no side effects from recent treatment She has chronic pain, stable No recent bleeding from anticoagulation therapy  REVIEW OF SYSTEMS:   Constitutional: Denies fevers, chills or abnormal weight loss Eyes:  Denies blurriness of vision Ears, nose, mouth, throat, and face: Denies mucositis or sore throat Respiratory: Denies cough, dyspnea or wheezes Cardiovascular: Denies palpitation, chest discomfort or lower extremity swelling Gastrointestinal:  Denies nausea, heartburn or change in bowel habits Skin: Denies abnormal skin rashes Lymphatics: Denies new lymphadenopathy or easy bruising Neurological:Denies numbness, tingling or new weaknesses Behavioral/Psych: Mood is stable, no new changes  All other systems were reviewed with the patient and are negative.  I have reviewed the past medical history, past surgical history, social history and family history with the patient and they are unchanged from previous note.  ALLERGIES:  has No Known Allergies.  MEDICATIONS:  Current Outpatient Medications  Medication Sig Dispense Refill   apixaban (ELIQUIS) 2.5 MG TABS tablet Take by mouth 2 (two) times daily.     diclofenac sodium (VOLTAREN) 1 % GEL APPLY 4GRAMS 4 TIMES A DAY AS NEEDED FOR PAINS     gabapentin (NEURONTIN) 300 MG capsule Take 300 mg by mouth 3 (three) times daily.     lidocaine-prilocaine (EMLA) cream Apply 1 application topically daily as needed. 30 g 3   methadone (DOLOPHINE) 10 MG tablet Take 1 tablet (10 mg total) by mouth every 12 (twelve) hours. 60 tablet 0   morphine (MSIR) 15 MG tablet Take 1 tablet (15 mg total) by mouth every 6 (six) hours as needed for severe pain. 60 tablet 0   Olopatadine HCl 0.2 % SOLN Place 1 drop into both eyes daily.     No current facility-administered medications for this visit.   Facility-Administered Medications Ordered in Other Visits  Medication Dose Route Frequency Provider Last Rate Last Admin   0.9 %  sodium chloride infusion   Intravenous Once Alvy Bimler, Jahmeek Shirk, MD       heparin lock flush 100 unit/mL  500 Units Intracatheter Once PRN Alvy Bimler, Kortlynn Poust, MD       pembrolizumab (KEYTRUDA) 200 mg in sodium chloride 0.9 % 50 mL chemo infusion  200 mg  Intravenous Once Alvy Bimler, Semira Stoltzfus, MD       sodium chloride flush (NS) 0.9 % injection 10 mL  10 mL Intracatheter PRN [name], MD        SUMMARY OF ONCOLOGIC HISTORY: Oncology History Overview Note  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 Uterus +/- tubes/ovaries, neoplastic, with left fallopian tube and ovary - INVASIVE ENDOMETRIOID CARCINOMA (1.5 CM), FIGO GRADE II, ARISING IN A BACKGROUND OF ATYPICAL COMPLEX HYPERPLASIA, CONFINED WITHIN INNER HALF OF THE MYOMETRIUM. - ENDOMETRIAL POLYP WITH ASSOCIATED ATYPICAL COMPLEX HYPERPLASIA. - MYOMETRIUM: LEIOMYOMATA. - CERVIX: BENIGN SQUAMOUS MUCOSA AND ENDOCERVICAL MUCOSA, NO DYSPLASIA OR MALIGNANCY. - LEFT OVARY: BENIGN OVARIAN TISSUE WITH ENDOSALPINGOSIS, NO EVIDENCE OF ATYPIA OR MALIGNANCY. - LEFT FALLOPIAN TUBE: NO HISTOLOGIC ABNORMALITIES. - PLEASE SEE ONCOLOGY TEMPLATE FOR DETAIL. 2. Ovary and fallopian tube, right - BENIGN OVARIAN TISSUE WITH ENDOSALPINGOSIS, NO ATYPIA OR MALIGNANCY. - BENIGN FALLOPIAN TUBAL TISSUE, NO PATHOLOGIC ABNORMALITIES. Microscopic Comment 1. UTERUS Specimen: Uterus, cervix, bilateral ovaries and fallopian tubes Procedure: Total hysterectomy and bilateral salpingo-oophorectomy Lymph node sampling performed: No Specimen integrity: Intact Maximum tumor size (cm): 1.5 cm, glass slide measurement Histologic type: Invasive endometrioid carcinoma Grade: FIGO grade II Myometrial invasion: 1 cm where myometrium is 2.3 cm in thickness Cervical stromal involvement: No Extent of involvement of other organs: No Lymph vascular invasion: Not identified Peritoneal washings: Negative ([id number]) Lymph nodes: number examined N/A; number positive N/A TNM code: pT1a, pNX 1 oFf 3IGO Stage (based on pathologic findings, needs clinical correlation): IA  Comments: Sections  the endomyometrium away from the grossly identified endometrial polyp show an invasive FIGO grade II endometrioid carcinoma. The tumor is confined within inner half of the myometrium. No angiolymphatic invasion is identified. No cervical stromal involvement is identified. Sections of the grossly identified endometrial polyp show an endometrial polyp with associated atypical compacted hyperplasia with no definitive evidence of carcinoma.   12/07/2017 Imaging   US venous Doppler Right: No evidence of common femoral vein obstruction. Left: Findings consistent with acute deep vein thrombosis involving the left femoral vein, left proximal profunda vein, and left popliteal vein. Unable to adequately interrogate the common femoral and higher, or the calf secondary to significant edema and body habitus   12/07/2017 [facility] Admission   She presented to the ER and was diagnosed with acute DVT   01/18/2018 - 01/21/2018 Hospital Admission   She was admitted to the hospital for management of severe persistent DVT   01/18/2018 Imaging   US venous Doppler Right: No evidence of common femoral vein obstruction. Left: Findings consistent with acute deep vein thrombosis involving the left common femoral vein, and left popliteal vein.   01/19/2018 Surgery   Pre-operative Diagnosis: Subacute DVT with severe post thrombotic syndrome Post-operative diagnosis:  Same Surgeon:  Erlene Quan C. Donzetta Matters, MD Procedure Performed: 1.  Ultrasound-guided cannulation left small saphenous vein 2.  Left lower extremity and central venography 3.  Intravascular ultrasound of left popliteal, femoral,  common femoral, external and common iliac veins and IVC 4.  Stent of left common and external iliac veins with 14 x 60 mm Vici 5.  Moderate sedation with fentanyl and Versed for 50 minutes   Indications: 76 year old female with a history of DVT in October now presents with persistent left lower extremity swelling and ultrasound  demonstrating likely persistent DVT.  She has been on Xarelto at this time.  She is now indicated for venogram possible intervention.   Findings: Flow in the left lower extremity was stagnant throughout but by venogram all veins were patent.  There was a focal occlusive area approximately 2 cm in length at the common and external iliac vein junction at the hypogastric on the left.  After stenting and ballooning we had a diameter of 12 millimeters in the stent and venogram demonstrated flow in the lower extremity veins were previously was stagnant and no further residual stenosis in the left common and external iliac vein junction.   04/12/2018 Imaging   US Venous Doppler Right: No evidence of common femoral vein obstruction. Left: There is no evidence of deep vein thrombosis in the lower extremity. However, portions of this examination were limited- see technologist comments above. Left groin: Large hypoechoic area with mixed echoes noted measuring nearly 10 cm. Possible  hematoma versus unknown etiology. Ultrasound characteristics of enlarged lymph nodes noted in the groin.      05/15/2018 Imaging   US Venous Doppler Right: No evidence of deep vein thrombosis in the lower extremity. No indirect evidence of obstruction proximal to the inguinal ligament. Left: No reflux was noted in the common femoral vein , femoral vein in the thigh, popliteal vein, great saphenous vein at the saphenofemoral junction, great saphenous vein at the proximal thigh, great saphenous vein at the mid thigh, great saphenous vein  at the distal thigh, great saphenous vein at the knee, origin of the small saphenous vein, proximal small saphenous vein, and mid small saphenous vein. There is no evidence of deep vein thrombosis in the lower extremity. There is no evidence of superficial venous thrombosis. No cystic structure found in the popliteal fossa. Unable to evaluate extension of common femoral vein obstruction proximal to the  inguinal ligament.   06/01/2018 Imaging   1. 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 Additionally, there is lucency involving the medial sidewall of the left acetabulum with potential nondisplaced pathologic fracture. Further evaluation with contrast-enhanced pelvic MRI could be performed as clinically indicated. 2. The left pelvic arterial and venous system is encased by this infiltrative left pelvic sidewall mass however while difficult to ascertain, the left external iliac venous stent appears patent.   06/18/2018 Pathology Results   Lymph node for lymphoma, Left Inguinal - METASTATIC ADENOCARCINOMA, SEE COMMENT. Microscopic Comment Immunohistochemistry is positive for cytokeratin 7, PAX8, ER, and PR. Cytokeratin 5/6,and p63 are negative. The immunoprofile along with the patient's history are consistent with a gynecologic primary.   06/18/2018 Surgery   Pre-op Diagnosis: INGUINAL LYMPHADENOPATHY, PELVIC MASS      Procedure(s): EXCISIONAL BIOPSY DEEP LEFT INGUINAL LYMPH NODE   Surgeon(s): Coralie Keens, MD      06/24/2018 Cancer Staging   Staging form: Corpus Uteri - Carcinoma and Carcinosarcoma, AJCC 8th Edition - Clinical: Stage IVB (cT1a, cN2, pM1) - Signed by [name], MD on 06/24/2018    Genetic Testing   Patient has genetic testing done for MMR on pathology from 06/18/2018.  Results revealed patient has the following mutation(s): MMR: abnormal   06/29/2018 Procedure   Placement of a subcutaneous port device. Catheter tip at the SVC and right atrium junction.    Genetic Testing   Patient has genetic testing done for MSI on pathology from 06/18/2018. Results revealed patient has the following mutation(s): MSI: High   07/02/2018 PET scan   Previous hysterectomy, with asymmetric focus of hypermetabolic activity in the left vaginal cuff, suspicious for residual or recurrent  carcinoma.   Large hypermetabolic soft tissue mass involving the left pelvic sidewall and acetabulum, consistent with metastatic disease.   No evidence metastatic disease within the abdomen, chest, or neck.   07/09/2018 Tumor Marker   Patient's tumor was tested for the following markers: CA-125 Results of the tumor marker test revealed 9   07/10/2018 - 08/24/2018 Chemotherapy   The patient had carboplatin and taxol x 3 cycles   07/17/2018 Genetic Testing   Negative genetic testing on the common hereditary cancer panel.  The Common Hereditary Gene Panel offered by [facility] includes sequencing and/or deletion duplication testing of the following 48 genes: APC, ATM, AXIN2, BARD1, BMPR1A, BRCA1, BRCA2, BRIP1, CDH1, CDK4, CDKN2A (p14ARF), CDKN2A (p16INK4a), CHEK2, CTNNA1, DICER1, EPCAM (Deletion/duplication testing only), GREM1 (promoter region deletion/duplication testing only), KIT, MEN1, MLH1, MSH2, MSH3, MSH6, MUTYH, NBN, NF1, NHTL1, PALB2, PDGFRA, PMS2, POLD1, POLE, PTEN, RAD50, RAD51C, RAD51D, RNF43, SDHB, SDHC, SDHD, SMAD4, SMARCA4. STK11, TP53, TSC1, TSC2, and VHL.  The following genes were evaluated for sequence changes only: SDHA and HOXB13 c.251G>A variant only. The report date is Jul 17, 2018.    10/03/2018 Imaging   CT abdomen and pelvis 1.  No acute intra-abdominal process. 2. Grossly unchanged left pelvic sidewall mass with osseous involvement of the medial acetabulum. Progressive mild displacement of the associated comminuted pathologic fracture involving the right acetabulum and puboacetabular junction.  3. New venous stents extending from the left common iliac vein origin to the proximal left common femoral vein. The stents are patent.   11/06/2018 -  Chemotherapy   The patient had pembrolizumab for chemotherapy treatment.     01/28/2019 Imaging   1. No substantial interval change in exam. 2. Interval development of mild fullness in the left intrarenal collecting system and ureter  without overt hydronephrosis at this time. 3. Abnormal soft tissue along the left pelvic sidewall has decreased slightly in the interval. 4. Similar appearance of ill-defined fascial planes in the pelvis with some peritoneal thickening along the right pelvic sidewall and potentially involving the sigmoid mesocolon. 5. No substantial ascites.   05/03/2019 Imaging   1. Stable mild left pelvic sidewall soft tissue density. No new or progressive disease identified within the abdomen or pelvis.  2. Colonic diverticulosis. No radiographic evidence of diverticulitis.   Aortic Atherosclerosis (ICD10-I70.0).   09/09/2019 Imaging   1. No change in appearance of soft tissue thickening along the LEFT pelvic sidewall adjacent to chronic LEFT acetabular fracture. 2. Mild asymmetry of the bladder wall favoring the LEFT bladder wall, not well assessed. Similar accounting for variable degrees of distension on prior studies potentially related to prior radiation, attention on follow-up. 3. Signs of venous stenting in the LEFT hemipelvis with LEFT lower extremity muscular atrophy and mild stranding with similar appearance. Signs of colonic diverticulosis and diverticular disease without change.   02/24/2020 Imaging   1. Unchanged appearance of the pelvis as detailed below. 2. Unchanged soft tissue thickening of the left pelvic sidewall. 3. 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 4. No evidence discrete mass or lymphadenopathy nor metastatic disease in the abdomen or pelvis. 5. Status post hysterectomy and cholecystectomy. 6. Left common iliac vein stent. 7. Pancolonic diverticulosis.     09/07/2020 Imaging   Stable abnormal soft tissue density in the left pelvic sidewall. No new or progressive disease within the abdomen or pelvis.   Colonic diverticulosis. No radiographic evidence of diverticulitis.   Stable  severe destructive left hip arthropathy with fracture involving the medial acetabular wall.     03/01/2021 Imaging   Stable abnormal soft tissue density in the left pelvic sidewall. 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   No new or progressive disease within the abdomen or pelvis.   Colonic diverticulosis, without radiographic evidence of diverticulitis.   Tiny hiatal hernia.   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 Initial Diagnosis   Metastasis to lymph nodes ([place])   07/10/2018 - 08/24/2018 Chemotherapy   The patient had palonosetron (ALOXI) injection 0.25 mg, 0.25 mg, Intravenous,  Once, 3 of 6 cycles Administration: 0.25 mg (07/10/2018), 0.25 mg (07/31/2018), 0.25 mg (08/24/2018) CARBOplatin (PARAPLATIN) 480 mg in sodium chloride 0.9 % 250 mL chemo infusion, 480 mg (100 % of original dose 482.5 mg), Intravenous,  Once, 3 of 6 cycles Dose modification: 482.5 mg (original dose 482.5 mg, Cycle 1) Administration: 480 mg (07/10/2018), 480 mg (07/31/2018), 480 mg (08/24/2018) PACLitaxel (TAXOL) 276 mg in sodium chloride 0.9 % 250 mL chemo infusion (> 42m/m2), 140 mg/m2 = 276 mg (80 % of original dose 175 mg/m2), Intravenous,  Once, 3 of 6 cycles Dose modification: 140 mg/m2 (80 % of original dose 175 mg/m2, Cycle 1, Reason: Dose Not Tolerated) Administration: 276 mg (07/10/2018), 276 mg (07/31/2018), 276 mg (08/24/2018) fosaprepitant (EMEND) 150 mg, dexamethasone (DECADRON) 12 mg in sodium chloride 0.9 % 145 mL IVPB, , Intravenous,  Once, 3 of 6 cycles Administration:  (07/10/2018),  (07/31/2018),  (08/24/2018)  for chemotherapy treatment.    11/06/2018 -  Chemotherapy   The patient had pembrolizumab for chemotherapy treatment.     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  06/24/2018 Initial Diagnosis   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   07/10/2018 - 08/24/2018 Chemotherapy   The patient had palonosetron (ALOXI) injection 0.25 mg, 0.25 mg, Intravenous,  Once, 3 of 6 cycles Administration: 0.25  mg (07/10/2018), 0.25 mg (07/31/2018), 0.25 mg (08/24/2018) CARBOplatin (PARAPLATIN) 480 mg in sodium chloride 0.9 % 250 mL chemo infusion, 480 mg (100 % of original dose 482.5 mg), Intravenous,  Once, 3 of 6 cycles Dose modification: 482.5 mg (original dose 482.5 mg, Cycle 1) Administration: 480 mg (07/10/2018), 480 mg (07/31/2018), 480 mg (08/24/2018) PACLitaxel (TAXOL) 276 mg in sodium chloride 0.9 % 250 mL chemo infusion (> 89mm2), 140 mg/m2 = 276 mg (80 % of original dose 175 mg/m2), Intravenous,  Once, 3 of 6 cycles Dose modification: 140 mg/m2 (80 % of original dose 175 mg/m2, Cycle 1, Reason: Dose Not Tolerated) Administration: 276 mg (07/10/2018), 276 mg (07/31/2018), 276 mg (08/24/2018) fosaprepitant (EMEND) 150 mg, dexamethasone (DECADRON) 12 mg in sodium chloride 0.9 % 145 mL IVPB, , Intravenous,  Once, 3 of 6 cycles Administration:  (07/10/2018),  (07/31/2018),  (08/24/2018)  for chemotherapy treatment.    11/06/2018 -  Chemotherapy   The patient had pembrolizumab for chemotherapy treatment.     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       PHYSICAL EXAMINATION: ECOG PERFORMANCE STATUS: 1 - Symptomatic but completely ambulatory  Vitals:   08/17/21 1121  BP: (!) 129/95  Pulse: 93  Resp: 18  SpO2: 100%   Filed Weights   08/17/21 1121  Weight: 243 lb 9.6 oz (110.5 kg)    GENERAL:alert, no distress and comfortable NEURO: alert & oriented x 3 with fluent speech, no focal motor/sensory deficits  LABORATORY DATA:  I have reviewed the data as listed    Component Value Date/Time   NA 139 08/17/2021 1101   K 4.2 08/17/2021 1101   CL 108 08/17/2021 1101   CO2 26 08/17/2021 1101   GLUCOSE 90 08/17/2021 1101   BUN 11 08/17/2021 1101    CREATININE 0.71 08/17/2021 1101   CREATININE 0.57 11/30/2020 1115   CALCIUM 9.1 08/17/2021 1101   PROT 7.1 08/17/2021 1101   ALBUMIN 3.8 08/17/2021 1101   AST 10 (L) 08/17/2021 1101   AST 13 (L) 11/30/2020 1115   ALT 6 08/17/2021 1101   ALT 9 11/30/2020 1115   ALKPHOS 103 08/17/2021 1101   BILITOT 0.4 08/17/2021 1101   BILITOT 0.8 11/30/2020 1115   GFRNONAA >60 08/17/2021 1101   GFRNONAA >60 11/30/2020 1115   GFRAA >60 11/12/2019 1222    No results found for: "SPEP", "UPEP"  Lab Results  Component Value Date   WBC 3.2 (L) 08/17/2021   NEUTROABS 1.8 08/17/2021   HGB 10.7 (L) 08/17/2021   HCT 32.2 (L) 08/17/2021   MCV 88.0 08/17/2021   PLT 205 08/17/2021      Chemistry      Component Value Date/Time   NA 139 08/17/2021 1101   K 4.2 08/17/2021 1101   CL 108 08/17/2021 1101   CO2 26 08/17/2021 1101   BUN 11 08/17/2021 1101   CREATININE 0.71 08/17/2021 1101   CREATININE 0.57 11/30/2020 1115      Component Value Date/Time   CALCIUM 9.1 08/17/2021 1101   ALKPHOS 103 08/17/2021 1101   AST 10 (L) 08/17/2021 1101   AST 13 (L) 11/30/2020 1115   ALT 6 08/17/2021 1101   ALT 9 11/30/2020 1115   BILITOT 0.4 08/17/2021 1101   BILITOT 0.8 11/30/2020 1115

## 2021-08-17 NOTE — Assessment & Plan Note (Signed)
This is due to her treatment She is not symptomatic Observe 

## 2021-09-07 ENCOUNTER — Inpatient Hospital Stay: Payer: Medicare Other | Attending: Hematology and Oncology

## 2021-09-07 ENCOUNTER — Other Ambulatory Visit: Payer: Self-pay

## 2021-09-07 ENCOUNTER — Inpatient Hospital Stay: Payer: Medicare Other

## 2021-09-07 VITALS — BP 155/84 | HR 87 | Temp 97.6°F | Resp 17 | Wt 243.5 lb

## 2021-09-07 DIAGNOSIS — C55 Malignant neoplasm of uterus, part unspecified: Secondary | ICD-10-CM | POA: Insufficient documentation

## 2021-09-07 DIAGNOSIS — G893 Neoplasm related pain (acute) (chronic): Secondary | ICD-10-CM | POA: Diagnosis not present

## 2021-09-07 DIAGNOSIS — Z5112 Encounter for antineoplastic immunotherapy: Secondary | ICD-10-CM | POA: Diagnosis not present

## 2021-09-07 DIAGNOSIS — C774 Secondary and unspecified malignant neoplasm of inguinal and lower limb lymph nodes: Secondary | ICD-10-CM

## 2021-09-07 DIAGNOSIS — C7951 Secondary malignant neoplasm of bone: Secondary | ICD-10-CM

## 2021-09-07 DIAGNOSIS — Z79899 Other long term (current) drug therapy: Secondary | ICD-10-CM | POA: Diagnosis not present

## 2021-09-07 DIAGNOSIS — E039 Hypothyroidism, unspecified: Secondary | ICD-10-CM

## 2021-09-07 DIAGNOSIS — Z7189 Other specified counseling: Secondary | ICD-10-CM

## 2021-09-07 DIAGNOSIS — Z7982 Long term (current) use of aspirin: Secondary | ICD-10-CM | POA: Insufficient documentation

## 2021-09-07 DIAGNOSIS — C801 Malignant (primary) neoplasm, unspecified: Secondary | ICD-10-CM

## 2021-09-07 LAB — COMPREHENSIVE METABOLIC PANEL
ALT: 6 U/L (ref 0–44)
AST: 11 U/L — ABNORMAL LOW (ref 15–41)
Albumin: 3.9 g/dL (ref 3.5–5.0)
Alkaline Phosphatase: 95 U/L (ref 38–126)
Anion gap: 5 (ref 5–15)
BUN: 9 mg/dL (ref 8–23)
CO2: 27 mmol/L (ref 22–32)
Calcium: 9 mg/dL (ref 8.9–10.3)
Chloride: 109 mmol/L (ref 98–111)
Creatinine, Ser: 0.7 mg/dL (ref 0.44–1.00)
GFR, Estimated: >60 mL/min (ref >60)
Glucose, Bld: 94 mg/dL (ref 70–99)
Potassium: 3.5 mmol/L (ref 3.5–5.1)
Sodium: 141 mmol/L (ref 135–145)
Total Bilirubin: 0.6 mg/dL (ref 0.3–1.2)
Total Protein: 7.1 g/dL (ref 6.5–8.1)

## 2021-09-07 LAB — CBC WITH DIFFERENTIAL/PLATELET
Abs Immature Granulocytes: 0.01 10*3/uL (ref 0.00–0.07)
Basophils Absolute: 0 10*3/uL (ref 0.0–0.1)
Basophils Relative: 1 %
Eosinophils Absolute: 0.1 10*3/uL (ref 0.0–0.5)
Eosinophils Relative: 2 %
HCT: 32.2 % — ABNORMAL LOW (ref 36.0–46.0)
Hemoglobin: 10.6 g/dL — ABNORMAL LOW (ref 12.0–15.0)
Immature Granulocytes: 0 %
Lymphocytes Relative: 25 %
Lymphs Abs: 0.9 10*3/uL (ref 0.7–4.0)
MCH: 29 pg (ref 26.0–34.0)
MCHC: 32.9 g/dL (ref 30.0–36.0)
MCV: 88.2 fL (ref 80.0–100.0)
Monocytes Absolute: 0.4 10*3/uL (ref 0.1–1.0)
Monocytes Relative: 11 %
Neutro Abs: 2.2 10*3/uL (ref 1.7–7.7)
Neutrophils Relative %: 61 %
Platelets: 204 10*3/uL (ref 150–400)
RBC: 3.65 MIL/uL — ABNORMAL LOW (ref 3.87–5.11)
RDW: 14.5 % (ref 11.5–15.5)
WBC: 3.5 10*3/uL — ABNORMAL LOW (ref 4.0–10.5)
nRBC: 0 % (ref 0.0–0.2)

## 2021-09-07 LAB — TSH: TSH: 2.24 u[IU]/mL (ref 0.350–4.500)

## 2021-09-07 MED ORDER — HEPARIN SOD (PORK) LOCK FLUSH 100 UNIT/ML IV SOLN
500.0000 [IU] | Freq: Once | INTRAVENOUS | Status: AC | PRN
  Administered 2021-09-07: 500 [IU]

## 2021-09-07 MED ORDER — SODIUM CHLORIDE 0.9 % IV SOLN: Freq: Once | INTRAVENOUS | Status: AC

## 2021-09-07 MED ORDER — SODIUM CHLORIDE 0.9 % IV SOLN
400.0000 mg | Freq: Once | INTRAVENOUS | Status: AC
  Administered 2021-09-07: 400 mg via INTRAVENOUS
  Filled 2021-09-07: qty 16

## 2021-09-07 MED ORDER — SODIUM CHLORIDE 0.9% FLUSH
10.0000 mL | INTRAVENOUS | Status: DC | PRN
  Administered 2021-09-07: 10 mL

## 2021-09-07 MED ORDER — SODIUM CHLORIDE 0.9% FLUSH
10.0000 mL | Freq: Once | INTRAVENOUS | Status: AC
  Administered 2021-09-07: 10 mL

## 2021-09-10 ENCOUNTER — Other Ambulatory Visit: Payer: Self-pay

## 2021-09-17 ENCOUNTER — Other Ambulatory Visit: Payer: Self-pay

## 2021-10-07 ENCOUNTER — Other Ambulatory Visit: Payer: Self-pay

## 2021-10-08 ENCOUNTER — Ambulatory Visit
Admission: RE | Admit: 2021-10-08 | Discharge: 2021-10-08 | Disposition: A | Discharge status: Home / Self Care | Payer: Medicare Other | Source: Ambulatory Visit | Attending: Family Medicine | Admitting: Family Medicine

## 2021-10-08 DIAGNOSIS — S32402S Unspecified fracture of left acetabulum, sequela: Secondary | ICD-10-CM

## 2021-10-11 ENCOUNTER — Other Ambulatory Visit: Payer: Self-pay | Admitting: Family Medicine

## 2021-10-12 ENCOUNTER — Other Ambulatory Visit: Payer: Self-pay | Admitting: Family Medicine

## 2021-10-15 ENCOUNTER — Other Ambulatory Visit: Payer: Self-pay | Admitting: Family Medicine

## 2021-10-19 ENCOUNTER — Inpatient Hospital Stay: Payer: Medicare Other

## 2021-10-19 ENCOUNTER — Inpatient Hospital Stay: Payer: Medicare Other | Attending: Hematology and Oncology | Admitting: Hematology and Oncology

## 2021-10-19 ENCOUNTER — Encounter: Payer: Self-pay | Admitting: Hematology and Oncology

## 2021-10-19 ENCOUNTER — Other Ambulatory Visit: Payer: Self-pay

## 2021-10-19 VITALS — BP 130/80 | HR 70 | Temp 97.4°F | Resp 18

## 2021-10-19 VITALS — BP 133/68 | HR 68 | Temp 97.7°F | Resp 18 | Ht 61.0 in | Wt 236.6 lb

## 2021-10-19 DIAGNOSIS — C55 Malignant neoplasm of uterus, part unspecified: Secondary | ICD-10-CM

## 2021-10-19 DIAGNOSIS — D61818 Other pancytopenia: Secondary | ICD-10-CM | POA: Insufficient documentation

## 2021-10-19 DIAGNOSIS — K449 Diaphragmatic hernia without obstruction or gangrene: Secondary | ICD-10-CM | POA: Diagnosis not present

## 2021-10-19 DIAGNOSIS — M8580 Other specified disorders of bone density and structure, unspecified site: Secondary | ICD-10-CM | POA: Insufficient documentation

## 2021-10-19 DIAGNOSIS — C541 Malignant neoplasm of endometrium: Secondary | ICD-10-CM | POA: Insufficient documentation

## 2021-10-19 DIAGNOSIS — Z7189 Other specified counseling: Secondary | ICD-10-CM

## 2021-10-19 DIAGNOSIS — C779 Secondary and unspecified malignant neoplasm of lymph node, unspecified: Secondary | ICD-10-CM | POA: Diagnosis not present

## 2021-10-19 DIAGNOSIS — K5730 Diverticulosis of large intestine without perforation or abscess without bleeding: Secondary | ICD-10-CM | POA: Diagnosis not present

## 2021-10-19 DIAGNOSIS — E039 Hypothyroidism, unspecified: Secondary | ICD-10-CM

## 2021-10-19 DIAGNOSIS — C774 Secondary and unspecified malignant neoplasm of inguinal and lower limb lymph nodes: Secondary | ICD-10-CM

## 2021-10-19 DIAGNOSIS — Z79899 Other long term (current) drug therapy: Secondary | ICD-10-CM | POA: Diagnosis not present

## 2021-10-19 DIAGNOSIS — C7951 Secondary malignant neoplasm of bone: Secondary | ICD-10-CM | POA: Diagnosis not present

## 2021-10-19 DIAGNOSIS — I825Z2 Chronic embolism and thrombosis of unspecified deep veins of left distal lower extremity: Secondary | ICD-10-CM | POA: Diagnosis not present

## 2021-10-19 DIAGNOSIS — Z7901 Long term (current) use of anticoagulants: Secondary | ICD-10-CM | POA: Diagnosis not present

## 2021-10-19 DIAGNOSIS — Z5112 Encounter for antineoplastic immunotherapy: Secondary | ICD-10-CM | POA: Insufficient documentation

## 2021-10-19 DIAGNOSIS — M25559 Pain in unspecified hip: Secondary | ICD-10-CM | POA: Insufficient documentation

## 2021-10-19 DIAGNOSIS — G893 Neoplasm related pain (acute) (chronic): Secondary | ICD-10-CM | POA: Diagnosis not present

## 2021-10-19 DIAGNOSIS — Z86718 Personal history of other venous thrombosis and embolism: Secondary | ICD-10-CM | POA: Insufficient documentation

## 2021-10-19 DIAGNOSIS — C801 Malignant (primary) neoplasm, unspecified: Secondary | ICD-10-CM

## 2021-10-19 LAB — COMPREHENSIVE METABOLIC PANEL
ALT: 6 U/L (ref 0–44)
AST: 11 U/L — ABNORMAL LOW (ref 15–41)
Albumin: 4.1 g/dL (ref 3.5–5.0)
Alkaline Phosphatase: 111 U/L (ref 38–126)
Anion gap: 3 — ABNORMAL LOW (ref 5–15)
BUN: 13 mg/dL (ref 8–23)
CO2: 27 mmol/L (ref 22–32)
Calcium: 9.5 mg/dL (ref 8.9–10.3)
Chloride: 108 mmol/L (ref 98–111)
Creatinine, Ser: 0.71 mg/dL (ref 0.44–1.00)
GFR, Estimated: >60 mL/min (ref >60)
Glucose, Bld: 93 mg/dL (ref 70–99)
Potassium: 4 mmol/L (ref 3.5–5.1)
Sodium: 138 mmol/L (ref 135–145)
Total Bilirubin: 0.5 mg/dL (ref 0.3–1.2)
Total Protein: 7.5 g/dL (ref 6.5–8.1)

## 2021-10-19 LAB — CBC WITH DIFFERENTIAL/PLATELET
Abs Immature Granulocytes: 0.01 10*3/uL (ref 0.00–0.07)
Basophils Absolute: 0 10*3/uL (ref 0.0–0.1)
Basophils Relative: 1 %
Eosinophils Absolute: 0.1 10*3/uL (ref 0.0–0.5)
Eosinophils Relative: 2 %
HCT: 33.4 % — ABNORMAL LOW (ref 36.0–46.0)
Hemoglobin: 11.1 g/dL — ABNORMAL LOW (ref 12.0–15.0)
Immature Granulocytes: 0 %
Lymphocytes Relative: 24 %
Lymphs Abs: 0.9 10*3/uL (ref 0.7–4.0)
MCH: 29 pg (ref 26.0–34.0)
MCHC: 33.2 g/dL (ref 30.0–36.0)
MCV: 87.2 fL (ref 80.0–100.0)
Monocytes Absolute: 0.4 10*3/uL (ref 0.1–1.0)
Monocytes Relative: 11 %
Neutro Abs: 2.2 10*3/uL (ref 1.7–7.7)
Neutrophils Relative %: 62 %
Platelets: 211 10*3/uL (ref 150–400)
RBC: 3.83 MIL/uL — ABNORMAL LOW (ref 3.87–5.11)
RDW: 14.1 % (ref 11.5–15.5)
WBC: 3.6 10*3/uL — ABNORMAL LOW (ref 4.0–10.5)
nRBC: 0 % (ref 0.0–0.2)

## 2021-10-19 LAB — TSH: TSH: 2.739 u[IU]/mL (ref 0.350–4.500)

## 2021-10-19 MED ORDER — SODIUM CHLORIDE 0.9% FLUSH
10.0000 mL | Freq: Once | INTRAVENOUS | Status: AC
  Administered 2021-10-19: 10 mL

## 2021-10-19 MED ORDER — SODIUM CHLORIDE 0.9 % IV SOLN: Freq: Once | INTRAVENOUS | Status: AC

## 2021-10-19 MED ORDER — SODIUM CHLORIDE 0.9 % IV SOLN
400.0000 mg | Freq: Once | INTRAVENOUS | Status: AC
  Administered 2021-10-19: 400 mg via INTRAVENOUS
  Filled 2021-10-19: qty 16

## 2021-10-19 MED ORDER — SODIUM CHLORIDE 0.9% FLUSH
10.0000 mL | INTRAVENOUS | Status: DC | PRN
  Administered 2021-10-19: 10 mL

## 2021-10-19 MED ORDER — HEPARIN SOD (PORK) LOCK FLUSH 100 UNIT/ML IV SOLN
500.0000 [IU] | Freq: Once | INTRAVENOUS | Status: AC | PRN
  Administered 2021-10-19: 500 [IU]

## 2021-10-19 NOTE — Assessment & Plan Note (Signed)
She is taking both anticoagulation therapy and antiplatelet agent She has no recent bleeding She will continue her medications as directed

## 2021-10-19 NOTE — Progress Notes (Signed)
[name] OFFICE PROGRESS NOTE  Patient Care Team: Leslie Pardon, MD as PCP - General (Family Medicine)  ASSESSMENT & PLAN:  Uterine cancer [name]) She has been receiving maintenance treatment for almost 3 years She is interested to discontinue treatment if her next imaging study is normal I plan to repeat imaging study in a few weeks for further follow-up I think it is reasonable to stop treatment after today if her CT imaging show no evidence of active disease  Pancytopenia, acquired ([place]) This is due to her treatment She is not symptomatic Observe  Cancer associated pain Her cancer associated pain is due to left hip fracture and related to her previous treatment She will continue her prescribed pain medicine  Lower leg DVT (deep venous thromboembolism), chronic, left ([place]) She is taking both anticoagulation therapy and antiplatelet agent She has no recent bleeding She will continue her medications as directed   Orders Placed This Encounter  Procedures   CT ABDOMEN PELVIS W CONTRAST    Standing Status:   Future    Standing Expiration Date:   10/20/2022    Order Specific Question:   If indicated for the ordered procedure, I authorize the administration of contrast media per Radiology protocol    Answer:   Yes    Order Specific Question:   Preferred imaging location?    Answer:   [facility]    Order Specific Question:   Radiology Contrast Protocol - do NOT remove file path    Answer:   [email or web address]    All questions were answered. The patient knows to call the clinic with any problems, questions or concerns. The total time spent in the appointment was 30 minutes encounter with patients including review of chart and various tests results, discussions about plan of care and coordination of care plan   Leslie [name], MD 10/19/2021 11:51 AM  INTERVAL HISTORY: Please see below for problem oriented  charting. she returns for treatment follow-up on maintenance pembrolizumab She is doing well Her chronic pain is stable She has no recent bleeding complications from treatment We discussed goals of care She is interested to stop treatment in the near future, after today if possible  REVIEW OF SYSTEMS:   Constitutional: Denies fevers, chills or abnormal weight loss Eyes: Denies blurriness of vision Ears, nose, mouth, throat, and face: Denies mucositis or sore throat Respiratory: Denies cough, dyspnea or wheezes Cardiovascular: Denies palpitation, chest discomfort or lower extremity swelling Gastrointestinal:  Denies nausea, heartburn or change in bowel habits Skin: Denies abnormal skin rashes Lymphatics: Denies new lymphadenopathy or easy bruising Neurological:Denies numbness, tingling or new weaknesses Behavioral/Psych: Mood is stable, no new changes  All other systems were reviewed with the patient and are negative.  I have reviewed the past medical history, past surgical history, social history and family history with the patient and they are unchanged from previous note.  ALLERGIES:  has No Known Allergies.  MEDICATIONS:  Current Outpatient Medications  Medication Sig Dispense Refill   apixaban (ELIQUIS) 2.5 MG TABS tablet Take by mouth 2 (two) times daily.     diclofenac sodium (VOLTAREN) 1 % GEL APPLY 4GRAMS 4 TIMES A DAY AS NEEDED FOR PAINS     gabapentin (NEURONTIN) 300 MG capsule Take 300 mg by mouth 3 (three) times daily.     lidocaine-prilocaine (EMLA) cream Apply 1 application topically daily as needed. 30 g 3   methadone (DOLOPHINE) 10 MG tablet Take 1 tablet (10 mg total) by mouth every  12 (twelve) hours. 60 tablet 0   morphine (MSIR) 15 MG tablet Take 1 tablet (15 mg total) by mouth every 6 (six) hours as needed for severe pain. 60 tablet 0   Olopatadine HCl 0.2 % SOLN Place 1 drop into both eyes daily.     No current facility-administered medications for this visit.    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 Uterus +/- tubes/ovaries, neoplastic, with left fallopian tube and ovary - INVASIVE ENDOMETRIOID CARCINOMA (1.5 CM), FIGO GRADE II, ARISING IN A BACKGROUND OF ATYPICAL COMPLEX HYPERPLASIA, CONFINED WITHIN INNER HALF OF THE MYOMETRIUM. - ENDOMETRIAL POLYP WITH ASSOCIATED ATYPICAL COMPLEX HYPERPLASIA. - MYOMETRIUM: LEIOMYOMATA. - CERVIX: BENIGN SQUAMOUS MUCOSA AND ENDOCERVICAL MUCOSA, NO DYSPLASIA OR MALIGNANCY. - LEFT OVARY: BENIGN OVARIAN TISSUE WITH ENDOSALPINGOSIS, NO EVIDENCE OF ATYPIA OR MALIGNANCY. - LEFT FALLOPIAN TUBE: NO HISTOLOGIC ABNORMALITIES. - PLEASE SEE ONCOLOGY TEMPLATE FOR DETAIL. 2. Ovary and fallopian tube, right - BENIGN OVARIAN TISSUE WITH ENDOSALPINGOSIS, NO ATYPIA OR MALIGNANCY. - BENIGN FALLOPIAN TUBAL TISSUE, NO PATHOLOGIC ABNORMALITIES. Microscopic Comment 1. UTERUS Specimen: Uterus, cervix, bilateral ovaries and fallopian tubes Procedure: Total hysterectomy and bilateral salpingo-oophorectomy Lymph node sampling performed: No Specimen integrity: Intact Maximum tumor size (cm): 1.5 cm, glass slide measurement Histologic type: Invasive endometrioid carcinoma Grade: FIGO grade II Myometrial invasion: 1 cm where myometrium is 2.3 cm in thickness Cervical stromal involvement:  No Extent of involvement of other organs: No Lymph vascular invasion: Not identified Peritoneal washings: Negative ([id number]) Lymph nodes: number examined N/A; number positive N/A TNM code: pT1a, pNX 1 oFf 3IGO Stage (based on pathologic findings, needs clinical correlation): IA  Comments: Sections the endomyometrium away from the grossly identified endometrial polyp show an invasive FIGO grade II endometrioid carcinoma. The tumor is confined within inner half of the myometrium. No angiolymphatic invasion is identified. No cervical stromal involvement is identified. Sections of the grossly identified endometrial polyp show an endometrial polyp with associated atypical compacted hyperplasia with no definitive evidence of carcinoma.   12/07/2017 Imaging   US venous Doppler Right: No evidence of common femoral vein obstruction. Left: Findings consistent with acute deep vein thrombosis involving the left femoral vein, left proximal profunda vein, and left popliteal vein. Unable to adequately interrogate the common femoral and higher, or the calf secondary to significant edema and body habitus   12/07/2017 [facility] Admission   She presented to the ER and was diagnosed with acute DVT   01/18/2018 - 01/21/2018 Hospital Admission   She was admitted to the hospital for management of severe persistent DVT   01/18/2018 Imaging   US venous Doppler Right: No evidence of common femoral vein obstruction. Left: Findings consistent with acute deep vein thrombosis involving the left common femoral vein, and left popliteal vein.   01/19/2018  Surgery   Pre-operative Diagnosis: Subacute DVT with severe post thrombotic syndrome Post-operative diagnosis:  Same Surgeon:  Eda Paschal. Donzetta Matters, MD Procedure Performed: 1.  Ultrasound-guided cannulation left small saphenous vein 2.  Left lower extremity and central venography 3.  Intravascular ultrasound of left popliteal, femoral, common femoral, external and  common iliac veins and IVC 4.  Stent of left common and external iliac veins with 14 x 60 mm Vici 5.  Moderate sedation with fentanyl and Versed for 50 minutes   Indications: 76 year old female with a history of DVT in October now presents with persistent left lower extremity swelling and ultrasound demonstrating likely persistent DVT.  She has been on Xarelto at this time.  She is now indicated for venogram possible intervention.   Findings: Flow in the left lower extremity was stagnant throughout but by venogram all veins were patent.  There was a focal occlusive area approximately 2 cm in length at the common and external iliac vein junction at the hypogastric on the left.  After stenting and ballooning we had a diameter of 12 millimeters in the stent and venogram demonstrated flow in the lower extremity veins were previously was stagnant and no further residual stenosis in the left common and external iliac vein junction.   04/12/2018 Imaging   US Venous Doppler Right: No evidence of common femoral vein obstruction. Left: There is no evidence of deep vein thrombosis in the lower extremity. However, portions of this examination were limited- see technologist comments above. Left groin: Large hypoechoic area with mixed echoes noted measuring nearly 10 cm. Possible  hematoma versus unknown etiology. Ultrasound characteristics of enlarged lymph nodes noted in the groin.      05/15/2018 Imaging   US Venous Doppler Right: No evidence of deep vein thrombosis in the lower extremity. No indirect evidence of obstruction proximal to the inguinal ligament. Left: No reflux was noted in the common femoral vein , femoral vein in the thigh, popliteal vein, great saphenous vein at the saphenofemoral junction, great saphenous vein at the proximal thigh, great saphenous vein at the mid thigh, great saphenous vein  at the distal thigh, great saphenous vein at the knee, origin of the small saphenous vein, proximal  small saphenous vein, and mid small saphenous vein. There is no evidence of deep vein thrombosis in the lower extremity. There is no evidence of superficial venous thrombosis. No cystic structure found in the popliteal fossa. Unable to evaluate extension of common femoral vein obstruction proximal to the inguinal ligament.   06/01/2018 Imaging   1. 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 Additionally, there is lucency involving the medial sidewall of the left acetabulum with potential nondisplaced pathologic fracture. Further evaluation with contrast-enhanced pelvic MRI could be performed as clinically indicated. 2. The left pelvic arterial and venous system is encased by this infiltrative left pelvic sidewall mass however while difficult to ascertain, the left external iliac venous stent appears patent.   06/18/2018 Pathology Results   Lymph node for lymphoma, Left Inguinal - METASTATIC ADENOCARCINOMA, SEE COMMENT. Microscopic Comment Immunohistochemistry is positive for cytokeratin 7, PAX8, ER, and PR. Cytokeratin 5/6,and p63 are negative. The immunoprofile along with the patient's history are consistent with a gynecologic primary.   06/18/2018 Surgery   Pre-op Diagnosis: INGUINAL LYMPHADENOPATHY, PELVIC MASS      Procedure(s): EXCISIONAL BIOPSY DEEP LEFT INGUINAL LYMPH NODE   Surgeon(s): Coralie Keens, MD      06/24/2018  Cancer Staging   Staging form: Corpus Uteri - Carcinoma and Carcinosarcoma, AJCC 8th Edition - Clinical: Stage IVB (cT1a, cN2, pM1) - Signed by Leslie [name], MD on 06/24/2018    Genetic Testing   Patient has genetic testing done for MMR on pathology from 06/18/2018. Results revealed patient has the following mutation(s): MMR: abnormal   06/29/2018 Procedure   Placement of a subcutaneous port device. Catheter tip at the SVC and right atrium junction.    Genetic  Testing   Patient has genetic testing done for MSI on pathology from 06/18/2018. Results revealed patient has the following mutation(s): MSI: High   07/02/2018 PET scan   Previous hysterectomy, with asymmetric focus of hypermetabolic activity in the left vaginal cuff, suspicious for residual or recurrent carcinoma.   Large hypermetabolic soft tissue mass involving the left pelvic sidewall and acetabulum, consistent with metastatic disease.   No evidence metastatic disease within the abdomen, chest, or neck.   07/09/2018 Tumor Marker   Patient's tumor was tested for the following markers: CA-125 Results of the tumor marker test revealed 9   07/10/2018 - 08/24/2018 Chemotherapy   The patient had carboplatin and taxol x 3 cycles   07/17/2018 Genetic Testing   Negative genetic testing on the common hereditary cancer panel.  The Common Hereditary Gene Panel offered by [facility] includes sequencing and/or deletion duplication testing of the following 48 genes: APC, ATM, AXIN2, BARD1, BMPR1A, BRCA1, BRCA2, BRIP1, CDH1, CDK4, CDKN2A (p14ARF), CDKN2A (p16INK4a), CHEK2, CTNNA1, DICER1, EPCAM (Deletion/duplication testing only), GREM1 (promoter region deletion/duplication testing only), KIT, MEN1, MLH1, MSH2, MSH3, MSH6, MUTYH, NBN, NF1, NHTL1, PALB2, PDGFRA, PMS2, POLD1, POLE, PTEN, RAD50, RAD51C, RAD51D, RNF43, SDHB, SDHC, SDHD, SMAD4, SMARCA4. STK11, TP53, TSC1, TSC2, and VHL.  The following genes were evaluated for sequence changes only: SDHA and HOXB13 c.251G>A variant only. The report date is Jul 17, 2018.    10/03/2018 Imaging   CT abdomen and pelvis 1.  No acute intra-abdominal process. 2. Grossly unchanged left pelvic sidewall mass with osseous involvement of the medial acetabulum. Progressive mild displacement of the associated comminuted pathologic fracture involving the right acetabulum and puboacetabular junction.  3. New venous stents extending from the left common iliac vein origin to the  proximal left common femoral vein. The stents are patent.   11/06/2018 -  Chemotherapy   The patient had pembrolizumab for chemotherapy treatment.     01/28/2019 Imaging   1. No substantial interval change in exam. 2. Interval development of mild fullness in the left intrarenal collecting system and ureter without overt hydronephrosis at this time. 3. Abnormal soft tissue along the left pelvic sidewall has decreased slightly in the interval. 4. Similar appearance of ill-defined fascial planes in the pelvis with some peritoneal thickening along the right pelvic sidewall and potentially involving the sigmoid mesocolon. 5. No substantial ascites.   05/03/2019 Imaging   1. Stable mild left pelvic sidewall soft tissue density. No new or progressive disease identified within the abdomen or pelvis.  2. Colonic diverticulosis. No radiographic evidence of diverticulitis.   Aortic Atherosclerosis (ICD10-I70.0).   09/09/2019 Imaging   1. No change in appearance of soft tissue thickening along the LEFT pelvic sidewall adjacent to chronic LEFT acetabular fracture. 2. Mild asymmetry of the bladder wall favoring the LEFT bladder wall, not well assessed. Similar accounting for variable degrees of distension on prior studies potentially related to prior radiation, attention on follow-up. 3. Signs of venous stenting in the LEFT hemipelvis with LEFT lower extremity muscular atrophy  and mild stranding with similar appearance. Signs of colonic diverticulosis and diverticular disease without change.   02/24/2020 Imaging   1. Unchanged appearance of the pelvis as detailed below. 2. Unchanged soft tissue thickening of the left pelvic sidewall. 3. 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 4. No evidence discrete mass or lymphadenopathy nor metastatic disease in the abdomen or pelvis. 5. Status post hysterectomy and  cholecystectomy. 6. Left common iliac vein stent. 7. Pancolonic diverticulosis.     09/07/2020 Imaging   Stable abnormal soft tissue density in the left pelvic sidewall. No new or progressive disease within the abdomen or pelvis.   Colonic diverticulosis. No radiographic evidence of diverticulitis.   Stable severe destructive left hip arthropathy with fracture involving the medial acetabular wall.     03/01/2021 Imaging   Stable abnormal soft tissue density in the left pelvic sidewall. 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   No new or progressive disease within the abdomen or pelvis.   Colonic diverticulosis, without radiographic evidence of diverticulitis.   Tiny hiatal hernia.   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 Initial Diagnosis   Metastasis to lymph nodes ([place])   07/10/2018 - 08/24/2018 Chemotherapy   The patient had palonosetron (ALOXI) injection 0.25 mg, 0.25 mg, Intravenous,  Once, 3 of 6 cycles Administration: 0.25 mg (07/10/2018), 0.25 mg (07/31/2018), 0.25 mg (08/24/2018) CARBOplatin (PARAPLATIN) 480 mg in sodium chloride 0.9 % 250 mL chemo infusion, 480 mg (100 % of original dose 482.5 mg), Intravenous,  Once, 3 of 6 cycles Dose modification: 482.5 mg (original dose 482.5 mg, Cycle 1) Administration: 480 mg (07/10/2018), 480 mg (07/31/2018), 480 mg (08/24/2018) PACLitaxel (TAXOL) 276 mg in sodium chloride 0.9 % 250 mL chemo infusion (> 35m/m2), 140 mg/m2 = 276 mg (80 % of original dose 175 mg/m2), Intravenous,  Once, 3 of 6 cycles Dose modification: 140 mg/m2 (80 % of original dose 175 mg/m2, Cycle 1, Reason: Dose Not Tolerated) Administration: 276 mg (07/10/2018), 276 mg (07/31/2018), 276 mg (08/24/2018) fosaprepitant (EMEND) 150 mg, dexamethasone (DECADRON) 12 mg in sodium chloride 0.9 % 145 mL IVPB, , Intravenous,  Once, 3 of 6 cycles Administration:  (07/10/2018),  (07/31/2018),  (08/24/2018)  for chemotherapy treatment.    11/06/2018 -   Chemotherapy   The patient had pembrolizumab for chemotherapy treatment.     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  06/24/2018 Initial Diagnosis   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   07/10/2018 - 08/24/2018 Chemotherapy   The patient had palonosetron (ALOXI) injection 0.25 mg, 0.25 mg, Intravenous,  Once, 3 of 6 cycles Administration: 0.25 mg (07/10/2018), 0.25 mg (07/31/2018), 0.25 mg (08/24/2018) CARBOplatin (PARAPLATIN) 480 mg in sodium chloride 0.9 % 250 mL chemo infusion, 480 mg (100 % of original dose 482.5 mg), Intravenous,  Once, 3 of 6 cycles Dose modification: 482.5 mg (original dose 482.5 mg, Cycle 1) Administration: 480 mg (07/10/2018), 480 mg (07/31/2018), 480 mg (08/24/2018) PACLitaxel (TAXOL) 276 mg in sodium chloride 0.9 % 250 mL chemo infusion (> 844mm2), 140 mg/m2 = 276 mg (80 % of original dose 175 mg/m2), Intravenous,  Once, 3 of 6 cycles Dose modification: 140 mg/m2 (80 % of original dose 175 mg/m2, Cycle 1, Reason: Dose Not Tolerated) Administration: 276 mg (07/10/2018), 276 mg (07/31/2018), 276 mg (08/24/2018) fosaprepitant (EMEND) 150 mg, dexamethasone (DECADRON) 12 mg in sodium chloride 0.9 % 145 mL IVPB, , Intravenous,  Once, 3 of  6 cycles Administration:  (07/10/2018),  (07/31/2018),  (08/24/2018)  for chemotherapy treatment.    11/06/2018 -  Chemotherapy   The patient had pembrolizumab for chemotherapy treatment.     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       PHYSICAL EXAMINATION: ECOG PERFORMANCE STATUS: 2 - Symptomatic, <50% confined to bed  Vitals:   10/19/21 1105  BP: 133/68  Pulse: 68  Resp: 18  Temp: 97.7 F (36.5 C)  SpO2: 100%   Filed Weights   10/19/21 1105  Weight: 236 lb 9.6 oz (107.3 kg)    GENERAL:alert, no distress  and comfortable NEURO: alert & oriented x 3 with fluent speech, no focal motor/sensory deficits  LABORATORY DATA:  I have reviewed the data as listed    Component Value Date/Time   NA 138 10/19/2021 1033   K 4.0 10/19/2021 1033   CL 108 10/19/2021 1033   CO2 27 10/19/2021 1033   GLUCOSE 93 10/19/2021 1033   BUN 13 10/19/2021 1033   CREATININE 0.71 10/19/2021 1033   CREATININE 0.57 11/30/2020 1115   CALCIUM 9.5 10/19/2021 1033   PROT 7.5 10/19/2021 1033   ALBUMIN 4.1 10/19/2021 1033   AST 11 (L) 10/19/2021 1033   AST 13 (L) 11/30/2020 1115   ALT 6 10/19/2021 1033   ALT 9 11/30/2020 1115   ALKPHOS 111 10/19/2021 1033   BILITOT 0.5 10/19/2021 1033   BILITOT 0.8 11/30/2020 1115   GFRNONAA >60 10/19/2021 1033   GFRNONAA >60 11/30/2020 1115   GFRAA >60 11/12/2019 1222    No results found for: "SPEP", "UPEP"  Lab Results  Component Value Date   WBC 3.6 (L) 10/19/2021   NEUTROABS 2.2 10/19/2021   HGB 11.1 (L) 10/19/2021   HCT 33.4 (L) 10/19/2021   MCV 87.2 10/19/2021   PLT 211 10/19/2021      Chemistry      Component Value Date/Time   NA 138 10/19/2021 1033   K 4.0 10/19/2021 1033   CL 108 10/19/2021 1033   CO2 27 10/19/2021 1033   BUN 13 10/19/2021 1033   CREATININE 0.71 10/19/2021 1033   CREATININE 0.57 11/30/2020 1115      Component Value Date/Time   CALCIUM 9.5 10/19/2021 1033   ALKPHOS 111 10/19/2021 1033   AST 11 (L) 10/19/2021 1033   AST 13 (L) 11/30/2020 1115   ALT 6 10/19/2021 1033   ALT 9 11/30/2020 1115   BILITOT 0.5 10/19/2021 1033   BILITOT 0.8 11/30/2020 1115       RADIOGRAPHIC STUDIES: I have personally reviewed the radiological images as listed and agreed with the findings in the report. DG Bone Density  Result Date: 10/08/2021 EXAM: DUAL X-RAY ABSORPTIOMETRY (DXA) FOR BONE MINERAL DENSITY IMPRESSION: Referring Physician:  Loura [facility] Your patient completed a bone mineral density test using GE Lunar iDXA system (analysis version: 16).  Technologist: [place] PATIENT: Name: Brindle, Leyba Patient ID: [id number] Birth Date: December 09, 1945 Height: 61.0 in. Sex: Female Measured: 10/08/2021 Weight: 243.5 lbs. Indications: Advanced Age, Estrogen Deficient, Hysterectomy, Postmenopausal Fractures: Left Hip Treatments: None ASSESSMENT: The BMD measured at Forearm Radius 33% is 0.957 g/cm2 with a T-score of 0.8. This patient is considered normal according to [facility]) criteria. The quality of the exam is limited by patient's body habitus and the  patient refused to lay supine for exam due to LT hip pain. The lumbar spine was excluded due to degenerative changes. Bilateral hip scans not obtained due to patient's refusal of scans. Site Region Measured Date Measured Age YA BMD Significant CHANGE T-score Left Forearm Radius 33% 10/08/2021 76.6 0.8 0.957 g/cm2 Left Forearm Radius 33% 06/21/2016 71.3 1.0 0.973 g/cm2 World Health Organization [facility]) criteria for post-menopausal, Caucasian Women: Normal       T-score at or above -1 SD Osteopenia   T-score between -1 and -2.5 SD Osteoporosis T-score at or below -2.5 SD RECOMMENDATION: 1. All patients should optimize calcium and vitamin D intake. 2. Consider FDA-approved medical therapies in postmenopausal women and men aged 77 years and older, based on the following: a. A hip or vertebral (clinical or morphometric) fracture. b. T-score = -2.5 at the femoral neck or spine after appropriate evaluation to exclude secondary causes. c. Low bone mass (T-score between -1.0 and -2.5 at the femoral neck or spine) and a 10-year probability of a hip fracture = 3% or a 10-year probability of a major osteoporosis-related fracture = 20% based on the [place]-adapted WHO algorithm. d. Clinician judgment and/or patient preferences may indicate treatment for people with 10-year fracture probabilities above or below these levels. FOLLOW-UP: Patients with diagnosis of osteoporosis or at high risk for fracture should have regular  bone mineral density tests. Patients eligible for Medicare are allowed routine testing every 2 years. The testing frequency can be increased to one year for patients who have rapidly progressing disease, are receiving or discontinuing medical therapy to restore bone mass, or have additional risk factors. I have reviewed this study and agree with the findings. [facility] Electronically Signed   By: Ammie Ferrier M.D.   On: 10/08/2021 12:05

## 2021-10-19 NOTE — Assessment & Plan Note (Signed)
She has been receiving maintenance treatment for almost 3 years She is interested to discontinue treatment if her next imaging study is normal I plan to repeat imaging study in a few weeks for further follow-up I think it is reasonable to stop treatment after today if her CT imaging show no evidence of active disease

## 2021-10-19 NOTE — Assessment & Plan Note (Signed)
Her cancer associated pain is due to left hip fracture and related to her previous treatment She will continue her prescribed pain medicine

## 2021-10-19 NOTE — Assessment & Plan Note (Signed)
This is due to her treatment She is not symptomatic Observe 

## 2021-10-19 NOTE — Patient Instructions (Signed)
[facility]  Discharge Instructions: °Thank you for choosing [facility] to provide your oncology and hematology care.  ° °If you have a lab appointment with the Cancer Center, please go directly to the Cancer Center and check in at the registration area. °  °Wear comfortable clothing and clothing appropriate for easy access to any Portacath or PICC line.  ° °We strive to give you quality time with your provider. You may need to reschedule your appointment if you arrive late (15 or more minutes).  Arriving late affects you and other patients whose appointments are after yours.  Also, if you miss three or more appointments without notifying the office, you may be dismissed from the clinic at the provider’s discretion.    °  °For prescription refill requests, have your pharmacy contact our office and allow 72 hours for refills to be completed.   ° °Today you received the following chemotherapy and/or immunotherapy agent: Pembrolizumab (Keytruda) °  °To help prevent nausea and vomiting after your treatment, we encourage you to take your nausea medication as directed. ° °BELOW ARE SYMPTOMS THAT SHOULD BE REPORTED IMMEDIATELY: °*FEVER GREATER THAN 100.4 F (38 °C) OR HIGHER °*CHILLS OR SWEATING °*NAUSEA AND VOMITING THAT IS NOT CONTROLLED WITH YOUR NAUSEA MEDICATION °*UNUSUAL SHORTNESS OF BREATH °*UNUSUAL BRUISING OR BLEEDING °*URINARY PROBLEMS (pain or burning when urinating, or frequent urination) °*BOWEL PROBLEMS (unusual diarrhea, constipation, pain near the anus) °TENDERNESS IN MOUTH AND THROAT WITH OR WITHOUT PRESENCE OF ULCERS (sore throat, sores in mouth, or a toothache) °UNUSUAL RASH, SWELLING OR PAIN  °UNUSUAL VAGINAL DISCHARGE OR ITCHING  ° °Items with * indicate a potential emergency and should be followed up as soon as possible or go to the Emergency Department if any problems should occur. ° °Please show the CHEMOTHERAPY ALERT CARD or IMMUNOTHERAPY ALERT CARD at  check-in to the Emergency Department and triage nurse. ° °Should you have questions after your visit or need to cancel or reschedule your appointment, please contact [facility]  Dept: [phone]  and follow the prompts.  Office hours are 8:00 a.m. to 4:30 p.m. Monday - Friday. Please note that voicemails left after 4:00 p.m. may not be returned until the following business day.  We are closed weekends and major holidays. You have access to a nurse at all times for urgent questions. Please call the main number to the clinic Dept: [phone] and follow the prompts. ° ° °For any non-urgent questions, you may also contact your provider using MyChart. We now offer e-Visits for anyone 18 and older to request care online for non-urgent symptoms. For details visit [email or web address]. °  °Also download the MyChart app! Go to the app store, search "MyChart", open the app, select [facility], and log in with your MyChart username and password. ° °Due to Covid, a mask is required upon entering the hospital/clinic. If you do not have a mask, one will be given to you upon arrival. For doctor visits, patients may have 1 support person aged 18 or older with them. For treatment visits, patients cannot have anyone with them due to current Covid guidelines and our immunocompromised population.  ° °

## 2021-10-20 ENCOUNTER — Other Ambulatory Visit: Payer: Self-pay

## 2021-11-06 ENCOUNTER — Ambulatory Visit (HOSPITAL_COMMUNITY)
Admission: RE | Admit: 2021-11-06 | Discharge: 2021-11-06 | Disposition: A | Discharge status: Home / Self Care | Payer: Medicare Other | Source: Ambulatory Visit | Attending: Hematology and Oncology | Admitting: Hematology and Oncology

## 2021-11-06 DIAGNOSIS — C774 Secondary and unspecified malignant neoplasm of inguinal and lower limb lymph nodes: Secondary | ICD-10-CM | POA: Insufficient documentation

## 2021-11-06 DIAGNOSIS — C55 Malignant neoplasm of uterus, part unspecified: Secondary | ICD-10-CM | POA: Diagnosis not present

## 2021-11-06 DIAGNOSIS — C7951 Secondary malignant neoplasm of bone: Secondary | ICD-10-CM | POA: Diagnosis present

## 2021-11-06 MED ORDER — SODIUM CHLORIDE (PF) 0.9 % IJ SOLN
INTRAMUSCULAR | Status: AC
  Filled 2021-11-06: qty 50

## 2021-11-06 MED ORDER — IOHEXOL 300 MG/ML  SOLN
100.0000 mL | Freq: Once | INTRAMUSCULAR | Status: AC | PRN
  Administered 2021-11-06: 100 mL via INTRAVENOUS

## 2021-11-08 ENCOUNTER — Other Ambulatory Visit: Payer: Self-pay

## 2021-11-08 ENCOUNTER — Inpatient Hospital Stay (HOSPITAL_BASED_OUTPATIENT_CLINIC_OR_DEPARTMENT_OTHER): Payer: Medicare Other | Admitting: Hematology and Oncology

## 2021-11-08 ENCOUNTER — Encounter: Payer: Self-pay | Admitting: Hematology and Oncology

## 2021-11-08 ENCOUNTER — Telehealth: Payer: Self-pay

## 2021-11-08 VITALS — BP 133/65 | HR 80 | Temp 97.6°F | Resp 18 | Ht 61.0 in | Wt 237.6 lb

## 2021-11-08 DIAGNOSIS — M1712 Unilateral primary osteoarthritis, left knee: Secondary | ICD-10-CM

## 2021-11-08 DIAGNOSIS — C55 Malignant neoplasm of uterus, part unspecified: Secondary | ICD-10-CM

## 2021-11-08 DIAGNOSIS — Z22322 Carrier or suspected carrier of Methicillin resistant Staphylococcus aureus: Secondary | ICD-10-CM | POA: Diagnosis not present

## 2021-11-08 DIAGNOSIS — C541 Malignant neoplasm of endometrium: Secondary | ICD-10-CM | POA: Diagnosis not present

## 2021-11-08 NOTE — Assessment & Plan Note (Signed)
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

## 2021-11-08 NOTE — Assessment & Plan Note (Signed)
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

## 2021-11-08 NOTE — Assessment & Plan Note (Signed)
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

## 2021-11-08 NOTE — Progress Notes (Signed)
[facility] OFFICE PROGRESS NOTE  Patient Care Team: Loura Pardon, MD as PCP - General (Family Medicine)  ASSESSMENT & PLAN:  Uterine cancer ([place]) I have reviewed imaging studies with the patient She has no signs of cancer recurrence She has been on treatment for over 3 years She had multiple negative CT imaging We discussed the risk and benefits of discontinuation of treatment and she is in agreement I will refer her back to GYN clinic for pelvic exam at the end of the year I plan to see her again in 6 months for further follow-up and repeat CT imaging We discussed port maintenance  DJD (degenerative joint disease) of knee She has significant degenerative changes throughout her entire left lower extremity She is taking pain medicine intermittently for this We discussed the role of surgery and the patient has declined surgical repair  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    Answer:   [facility]    Order Specific Question:   Radiology Contrast Protocol - do NOT remove file path    Answer:   [email or web address]    All questions were answered. The patient knows to call the clinic with any problems, questions or concerns. The total time spent in the appointment was 40 minutes encounter with patients including review of chart and various tests results, discussions about plan of care  and coordination of care plan   Leslie [name], MD 11/08/2021 12:44 PM  INTERVAL HISTORY: Please see below for problem oriented charting. she returns for treatment follow-up and review of imaging results She is doing well Her chronic hip pain is stable We spent majority of her time reviewing test results and discussed plan of care  REVIEW OF SYSTEMS:   Constitutional: Denies fevers, chills or abnormal weight loss Eyes: Denies blurriness of vision Ears, nose, mouth, throat, and face: Denies mucositis or sore throat Respiratory: Denies cough, dyspnea or wheezes Cardiovascular: Denies palpitation, chest discomfort or lower extremity swelling Gastrointestinal:  Denies nausea, heartburn or change in bowel habits Skin: Denies abnormal skin rashes Lymphatics: Denies new lymphadenopathy or easy bruising Neurological:Denies numbness, tingling or new weaknesses Behavioral/Psych: Mood is stable, no new changes  All other systems were reviewed with the patient and are negative.  I have reviewed the past medical history, past surgical history, social history and family history with the patient and they are unchanged from previous note.  ALLERGIES:  has No Known Allergies.  MEDICATIONS:  Current Outpatient Medications  Medication Sig Dispense Refill   apixaban (ELIQUIS) 2.5 MG TABS tablet Take by mouth 2 (two) times daily.     diclofenac sodium (VOLTAREN) 1 % GEL APPLY 4GRAMS 4 TIMES A DAY AS NEEDED FOR PAINS     gabapentin (NEURONTIN) 300 MG capsule Take 300 mg by mouth 3 (three) times daily.     lidocaine-prilocaine (EMLA) cream Apply 1 application topically daily as needed. 30 g 3   methadone (DOLOPHINE)  10 MG tablet Take 1 tablet (10 mg total) by mouth every 12 (twelve) hours. 60 tablet 0   morphine (MSIR) 15 MG tablet Take 1 tablet (15 mg total) by mouth every 6 (six) hours as needed for severe pain. 60 tablet 0   Olopatadine HCl 0.2 % SOLN Place 1 drop into both eyes daily.     No current  facility-administered medications for this visit.    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 Uterus +/- tubes/ovaries, neoplastic, with left fallopian tube and ovary - INVASIVE ENDOMETRIOID CARCINOMA (1.5 CM), FIGO GRADE II, ARISING IN A BACKGROUND OF ATYPICAL COMPLEX HYPERPLASIA, CONFINED WITHIN INNER HALF OF THE MYOMETRIUM. - ENDOMETRIAL POLYP WITH ASSOCIATED ATYPICAL COMPLEX HYPERPLASIA. - MYOMETRIUM: LEIOMYOMATA. - CERVIX: BENIGN SQUAMOUS MUCOSA AND ENDOCERVICAL MUCOSA, NO DYSPLASIA OR MALIGNANCY. - LEFT OVARY: BENIGN OVARIAN TISSUE WITH ENDOSALPINGOSIS, NO EVIDENCE OF ATYPIA OR MALIGNANCY. - LEFT FALLOPIAN TUBE: NO HISTOLOGIC ABNORMALITIES. - PLEASE SEE ONCOLOGY TEMPLATE FOR DETAIL. 2. Ovary and fallopian tube, right - BENIGN OVARIAN TISSUE WITH ENDOSALPINGOSIS, NO ATYPIA OR MALIGNANCY. - BENIGN FALLOPIAN TUBAL TISSUE, NO PATHOLOGIC ABNORMALITIES. Microscopic Comment 1. UTERUS Specimen: Uterus, cervix, bilateral ovaries and fallopian tubes Procedure: Total hysterectomy and bilateral salpingo-oophorectomy Lymph node sampling performed: No Specimen integrity: Intact Maximum tumor size (cm): 1.5 cm, glass slide measurement Histologic type: Invasive endometrioid carcinoma Grade: FIGO grade II Myometrial invasion: 1 cm where myometrium is 2.3 cm in thickness Cervical stromal involvement: No Extent of involvement of other organs: No Lymph vascular invasion: Not identified Peritoneal washings: Negative ([id number]) Lymph nodes: number examined N/A; number positive N/A TNM code: pT1a, pNX 1 oFf 3IGO Stage (based on pathologic findings, needs clinical correlation): IA  Comments: Sections the endomyometrium away from the grossly identified endometrial polyp show an  invasive FIGO grade II endometrioid carcinoma. The tumor is confined within inner half of the myometrium. No angiolymphatic invasion is identified. No cervical stromal involvement is identified. Sections of the grossly identified endometrial polyp show an endometrial polyp with associated atypical compacted hyperplasia with no definitive evidence of carcinoma.   12/07/2017 Imaging   US venous Doppler Right: No evidence of common femoral vein obstruction. Left: Findings consistent with acute deep vein thrombosis involving the left femoral vein, left proximal profunda vein, and left popliteal vein. Unable to adequately interrogate the common femoral and higher, or the calf secondary to significant edema and body habitus   12/07/2017 [facility] Admission   She presented to the ER and was diagnosed with acute DVT   01/18/2018 - 01/21/2018 Hospital Admission   She was admitted to the hospital for management of severe persistent DVT   01/18/2018 Imaging   US venous Doppler Right: No evidence of common femoral vein obstruction. Left: Findings consistent with acute deep vein thrombosis involving the left common femoral vein, and left popliteal vein.   01/19/2018 Surgery   Pre-operative Diagnosis: Subacute DVT with severe post thrombotic syndrome Post-operative diagnosis:  Same Surgeon:  Erlene Quan C. Donzetta Matters, MD Procedure Performed: 1.  Ultrasound-guided cannulation left small saphenous vein 2.  Left lower extremity and central venography 3.  Intravascular ultrasound of left popliteal, femoral, common femoral, external and common iliac veins and IVC 4.  Stent of left common and external iliac veins with 14 x 60 mm Vici 5.  Moderate  sedation with fentanyl and Versed for 50 minutes   Indications: 76 year old female with a history of DVT in October now presents with persistent left lower extremity swelling and ultrasound demonstrating likely persistent DVT.  She has been on Xarelto at this time.  She is now  indicated for venogram possible intervention.   Findings: Flow in the left lower extremity was stagnant throughout but by venogram all veins were patent.  There was a focal occlusive area approximately 2 cm in length at the common and external iliac vein junction at the hypogastric on the left.  After stenting and ballooning we had a diameter of 12 millimeters in the stent and venogram demonstrated flow in the lower extremity veins were previously was stagnant and no further residual stenosis in the left common and external iliac vein junction.   04/12/2018 Imaging   US Venous Doppler Right: No evidence of common femoral vein obstruction. Left: There is no evidence of deep vein thrombosis in the lower extremity. However, portions of this examination were limited- see technologist comments above. Left groin: Large hypoechoic area with mixed echoes noted measuring nearly 10 cm. Possible  hematoma versus unknown etiology. Ultrasound characteristics of enlarged lymph nodes noted in the groin.      05/15/2018 Imaging   US Venous Doppler Right: No evidence of deep vein thrombosis in the lower extremity. No indirect evidence of obstruction proximal to the inguinal ligament. Left: No reflux was noted in the common femoral vein , femoral vein in the thigh, popliteal vein, great saphenous vein at the saphenofemoral junction, great saphenous vein at the proximal thigh, great saphenous vein at the mid thigh, great saphenous vein  at the distal thigh, great saphenous vein at the knee, origin of the small saphenous vein, proximal small saphenous vein, and mid small saphenous vein. There is no evidence of deep vein thrombosis in the lower extremity. There is no evidence of superficial venous thrombosis. No cystic structure found in the popliteal fossa. Unable to evaluate extension of common femoral vein obstruction proximal to the inguinal ligament.   06/01/2018 Imaging   1. Infiltrative mass within the left pelvic  sidewall measuring approximately 9.5 cm with associated pathologically enlarged left inguinal lymph node. Additionally, there is lucency involving the medial sidewall of the left acetabulum with potential nondisplaced pathologic fracture. Further evaluation with contrast-enhanced pelvic MRI could be performed as clinically indicated. 2. The left pelvic arterial and venous system is encased by this infiltrative left pelvic sidewall mass however while difficult to ascertain, the left external iliac venous stent appears patent.   06/18/2018 Pathology Results   Lymph node for lymphoma, Left Inguinal - METASTATIC ADENOCARCINOMA, SEE COMMENT. Microscopic Comment Immunohistochemistry is positive for cytokeratin 7, PAX8, ER, and PR. Cytokeratin 5/6,and p63 are negative. The immunoprofile along with the patient's history are consistent with a gynecologic primary.   06/18/2018 Surgery   Pre-op Diagnosis: INGUINAL LYMPHADENOPATHY, PELVIC MASS      Procedure(s): EXCISIONAL BIOPSY DEEP LEFT INGUINAL LYMPH NODE   Surgeon(s): Coralie Keens, MD      06/24/2018 Cancer Staging   Staging form: Corpus Uteri - Carcinoma and Carcinosarcoma, AJCC 8th Edition - Clinical: Stage IVB (cT1a, cN2, pM1) - Signed by Leslie [name], MD on 06/24/2018    Genetic Testing   Patient has genetic testing done for MMR on pathology from 06/18/2018. Results revealed patient has the following mutation(s): MMR: abnormal   06/29/2018 Procedure   Placement of a subcutaneous port device. Catheter tip at the [facility] and right  atrium junction.    Genetic Testing   Patient has genetic testing done for MSI on pathology from 06/18/2018. Results revealed patient has the following mutation(s): MSI: High   07/02/2018 PET scan   Previous hysterectomy, with asymmetric focus of hypermetabolic activity in the left vaginal cuff, suspicious for residual or recurrent carcinoma.   Large hypermetabolic soft tissue mass involving the left pelvic sidewall  and acetabulum, consistent with metastatic disease.   No evidence metastatic disease within the abdomen, chest, or neck.   07/09/2018 Tumor Marker   Patient's tumor was tested for the following markers: CA-125 Results of the tumor marker test revealed 9   07/10/2018 - 08/24/2018 Chemotherapy   The patient had carboplatin and taxol x 3 cycles   07/17/2018 Genetic Testing   Negative genetic testing on the common hereditary cancer panel.  The Common Hereditary Gene Panel offered by [facility] includes sequencing and/or deletion duplication testing of the following 48 genes: APC, ATM, AXIN2, BARD1, BMPR1A, BRCA1, BRCA2, BRIP1, CDH1, CDK4, CDKN2A (p14ARF), CDKN2A (p16INK4a), CHEK2, CTNNA1, DICER1, EPCAM (Deletion/duplication testing only), GREM1 (promoter region deletion/duplication testing only), KIT, MEN1, MLH1, MSH2, MSH3, MSH6, MUTYH, NBN, NF1, NHTL1, PALB2, PDGFRA, PMS2, POLD1, POLE, PTEN, RAD50, RAD51C, RAD51D, RNF43, SDHB, SDHC, SDHD, SMAD4, SMARCA4. STK11, TP53, TSC1, TSC2, and VHL.  The following genes were evaluated for sequence changes only: SDHA and HOXB13 c.251G>A variant only. The report date is Jul 17, 2018.    10/03/2018 Imaging   CT abdomen and pelvis 1.  No acute intra-abdominal process. 2. Grossly unchanged left pelvic sidewall mass with osseous involvement of the medial acetabulum. Progressive mild displacement of the associated comminuted pathologic fracture involving the right acetabulum and puboacetabular junction.  3. New venous stents extending from the left common iliac vein origin to the proximal left common femoral vein. The stents are patent.   11/06/2018 -  Chemotherapy   The patient had pembrolizumab for chemotherapy treatment.     01/28/2019 Imaging   1. No substantial interval change in exam. 2. Interval development of mild fullness in the left intrarenal collecting system and ureter without overt hydronephrosis at this time. 3. Abnormal soft tissue along the left  pelvic sidewall has decreased slightly in the interval. 4. Similar appearance of ill-defined fascial planes in the pelvis with some peritoneal thickening along the right pelvic sidewall and potentially involving the sigmoid mesocolon. 5. No substantial ascites.   05/03/2019 Imaging   1. Stable mild left pelvic sidewall soft tissue density. No new or progressive disease identified within the abdomen or pelvis.  2. Colonic diverticulosis. No radiographic evidence of diverticulitis.   Aortic Atherosclerosis (ICD10-I70.0).   09/09/2019 Imaging   1. No change in appearance of soft tissue thickening along the LEFT pelvic sidewall adjacent to chronic LEFT acetabular fracture. 2. Mild asymmetry of the bladder wall favoring the LEFT bladder wall, not well assessed. Similar accounting for variable degrees of distension on prior studies potentially related to prior radiation, attention on follow-up. 3. Signs of venous stenting in the LEFT hemipelvis with LEFT lower extremity muscular atrophy and mild stranding with similar appearance. Signs of colonic diverticulosis and diverticular disease without change.   02/24/2020 Imaging   1. Unchanged appearance of the pelvis as detailed below. 2. Unchanged soft tissue thickening of the left pelvic sidewall. 3. 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 4. No evidence discrete mass or lymphadenopathy nor metastatic disease in the  abdomen or pelvis. 5. Status post hysterectomy and cholecystectomy. 6. Left common iliac vein stent. 7. Pancolonic diverticulosis.     09/07/2020 Imaging   Stable abnormal soft tissue density in the left pelvic sidewall. No new or progressive disease within the abdomen or pelvis.   Colonic diverticulosis. No radiographic evidence of diverticulitis.   Stable severe destructive left hip arthropathy with fracture involving the medial acetabular  wall.     03/01/2021 Imaging   Stable abnormal soft tissue density in the left pelvic sidewall. 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   No new or progressive disease within the abdomen or pelvis.   Colonic diverticulosis, without radiographic evidence of diverticulitis.   Tiny hiatal hernia.   11/08/2021 Imaging   1. 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 2. Cannot exclude wall thickening in the lower rectum, correlate with digital rectal exam. 3. Other imaging findings of potential clinical significance: Small type 1 hiatal hernia. Colonic diverticulosis. Left common iliac vein and external iliac vein stent graft. 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 Small umbilical hernia contains adipose tissue.   Metastasis to lymph nodes ([place])  06/23/2018 Initial Diagnosis   Metastasis to lymph nodes ([place])   07/10/2018 - 08/24/2018 Chemotherapy   The patient had palonosetron (ALOXI) injection 0.25 mg, 0.25 mg, Intravenous,  Once, 3 of 6 cycles Administration: 0.25 mg (07/10/2018), 0.25 mg (07/31/2018), 0.25 mg (08/24/2018) CARBOplatin (PARAPLATIN) 480 mg in sodium chloride 0.9 % 250 mL chemo infusion, 480 mg (100 % of original dose 482.5 mg), Intravenous,  Once, 3 of 6 cycles Dose modification: 482.5 mg (original dose 482.5 mg, Cycle 1) Administration: 480 mg (07/10/2018), 480 mg (07/31/2018), 480 mg (08/24/2018) PACLitaxel (TAXOL) 276 mg in sodium chloride 0.9 % 250 mL chemo infusion (> 62m/m2), 140 mg/m2 = 276 mg (80 % of original dose 175 mg/m2), Intravenous,  Once, 3 of 6 cycles Dose modification: 140 mg/m2 (80 % of original dose 175 mg/m2, Cycle 1, Reason: Dose Not  Tolerated) Administration: 276 mg (07/10/2018), 276 mg (07/31/2018), 276 mg (08/24/2018) fosaprepitant (EMEND) 150 mg, dexamethasone (DECADRON) 12 mg in sodium chloride 0.9 % 145 mL IVPB, , Intravenous,  Once, 3 of 6 cycles Administration:  (07/10/2018),  (07/31/2018),  (08/24/2018)  for chemotherapy treatment.    11/06/2018 -  Chemotherapy   The patient had pembrolizumab for chemotherapy treatment.     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  06/24/2018 Initial Diagnosis   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   07/10/2018 - 08/24/2018 Chemotherapy   The patient had palonosetron (ALOXI) injection 0.25 mg, 0.25 mg, Intravenous,  Once, 3 of 6 cycles Administration: 0.25 mg (07/10/2018), 0.25 mg (07/31/2018), 0.25 mg (08/24/2018) CARBOplatin (PARAPLATIN) 480 mg in sodium chloride 0.9 % 250 mL chemo infusion, 480 mg (100 % of original dose 482.5 mg), Intravenous,  Once, 3 of 6 cycles Dose modification: 482.5 mg (original dose 482.5 mg, Cycle 1) Administration: 480 mg (07/10/2018), 480 mg (07/31/2018), 480 mg (08/24/2018) PACLitaxel (TAXOL) 276 mg in sodium chloride 0.9 % 250 mL chemo infusion (> 871mm2), 140 mg/m2 = 276 mg (80 % of original dose 175 mg/m2), Intravenous,  Once, 3 of 6 cycles Dose modification: 140 mg/m2 (80 % of original dose 175 mg/m2, Cycle 1, Reason: Dose Not Tolerated) Administration: 276 mg (07/10/2018), 276 mg (07/31/2018),  276 mg (08/24/2018) fosaprepitant (EMEND) 150 mg, dexamethasone (DECADRON) 12 mg in sodium chloride 0.9 % 145 mL IVPB, , Intravenous,  Once, 3 of 6 cycles Administration:  (07/10/2018),  (07/31/2018),  (08/24/2018)  for chemotherapy treatment.    11/06/2018 -  Chemotherapy   The patient had pembrolizumab for chemotherapy treatment.     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       PHYSICAL EXAMINATION: ECOG PERFORMANCE STATUS: 2 - Symptomatic, <50% confined to bed  Vitals:   11/08/21 1114  BP: 133/65  Pulse: 80  Resp: 18  Temp: 97.6 F (36.4 C)  SpO2: 100%   Filed Weights   11/08/21 1114  Weight: 237 lb 9.6 oz (107.8 kg)    GENERAL:alert, no distress and comfortable NEURO: alert & oriented x 3 with fluent speech, no focal motor/sensory deficits  LABORATORY DATA:  I have reviewed the data as listed    Component Value Date/Time   NA 138 10/19/2021 1033   K 4.0 10/19/2021 1033   CL 108 10/19/2021 1033   CO2 27 10/19/2021 1033   GLUCOSE 93 10/19/2021 1033   BUN 13 10/19/2021 1033   CREATININE 0.71 10/19/2021 1033   CREATININE 0.57 11/30/2020 1115   CALCIUM 9.5 10/19/2021 1033   PROT 7.5 10/19/2021 1033   ALBUMIN 4.1 10/19/2021 1033   AST 11 (L) 10/19/2021 1033   AST 13 (L) 11/30/2020 1115   ALT 6 10/19/2021 1033   ALT 9 11/30/2020 1115   ALKPHOS 111 10/19/2021 1033   BILITOT 0.5 10/19/2021 1033   BILITOT 0.8 11/30/2020 1115   GFRNONAA >60 10/19/2021 1033   GFRNONAA >60 11/30/2020 1115   GFRAA >60 11/12/2019 1222    No results found for: "SPEP", "UPEP"  Lab Results  Component Value Date   WBC 3.6 (L) 10/19/2021   NEUTROABS 2.2 10/19/2021   HGB 11.1 (L) 10/19/2021   HCT 33.4 (L) 10/19/2021   MCV 87.2 10/19/2021   PLT 211 10/19/2021      Chemistry      Component Value Date/Time   NA 138 10/19/2021 1033   K 4.0 10/19/2021 1033   CL 108 10/19/2021 1033   CO2 27 10/19/2021 1033   BUN 13 10/19/2021 1033   CREATININE 0.71 10/19/2021 1033   CREATININE 0.57 11/30/2020 1115      Component Value Date/Time   CALCIUM 9.5 10/19/2021 1033   ALKPHOS 111 10/19/2021 1033   AST 11 (L) 10/19/2021 1033   AST 13 (L) 11/30/2020 1115   ALT 6 10/19/2021 1033   ALT 9 11/30/2020 1115   BILITOT 0.5 10/19/2021 1033   BILITOT 0.8 11/30/2020 1115       RADIOGRAPHIC STUDIES: I have personally reviewed the  radiological images as listed and agreed with the findings in the report. CT ABDOMEN PELVIS W CONTRAST  Result Date: 11/07/2021 CLINICAL DATA:  Restaging metastatic endometrial carcinoma. * Tracking Code: BO * EXAM: CT ABDOMEN AND PELVIS WITH CONTRAST TECHNIQUE: Multidetector CT imaging of the abdomen and pelvis was performed using the standard protocol following bolus administration of intravenous contrast. RADIATION DOSE REDUCTION: This exam was performed according to the departmental dose-optimization program which includes automated exposure control, adjustment of the mA and/or kV according to patient size and/or use of iterative reconstruction technique. CONTRAST:  126m OMNIPAQUE IOHEXOL 300  MG/ML  SOLN COMPARISON:  Multiple exams, including 02/28/2021 FINDINGS: Despite efforts by the technologist and patient, motion artifact is present on today's exam and could not be eliminated. This reduces exam sensitivity and specificity. Lower chest: Small type 1 hiatal hernia. Hepatobiliary: Cholecystectomy.  Otherwise unremarkable. Pancreas: Unremarkable Spleen: Unremarkable Adrenals/Urinary Tract: Unremarkable Stomach/Bowel: Colonic diverticulosis. Cannot exclude wall thickening in the lower rectum. Wall thickening in the sigmoid colon for example on image 55 series 2 is similar to prior and may be secondary to the more prominent diverticulosis in this vicinity, although remains technically nonspecific. Vascular/Lymphatic: Left common iliac vein and external iliac vein stent graft. Patency poorly assessed due to early contrast phase but probably patent. Reproductive: Uterus absent.  Adnexa unremarkable. 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 Unchanged from prior. 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 Stable demineralized upper quadrilateral space with  soft tissue density in the adjacent left pelvic sidewall and extending down along the upper margin of the left obturator internus, this thickening measures about 1.3 cm on image 57 series 2, formerly the same. Mild sclerosis along the left SI joint. Extensive lumbar spurring favoring diffuse idiopathic skeletal hyperostosis. Multilevel foraminal impingement in the lumbar spine. Small umbilical hernia contains adipose tissue. IMPRESSION: 1. 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 2. Cannot exclude wall thickening in the lower rectum, correlate with digital rectal exam. 3. Other imaging findings of potential clinical significance: Small type 1 hiatal hernia. Colonic diverticulosis. Left common iliac vein and external iliac vein stent graft. 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 Small umbilical hernia contains adipose tissue. Electronically Signed   By: Van Clines M.D.   On: 11/07/2021 15:15

## 2021-11-08 NOTE — Telephone Encounter (Signed)
Called and left a message to continue taking Eliquis, she needs to continue taking long term to prevent a recurrent blood clot per Dr. Alvy Bimler. Ask her to call the office back for questions.

## 2021-11-09 ENCOUNTER — Telehealth: Payer: Self-pay

## 2021-11-09 NOTE — Telephone Encounter (Signed)
Called patient and setup appointment.  Has been over 3 years since patient was last seen in office so will be considered new patient. Spoke with Mrs Deleonardis and setup appointment for 01/14/2022 at 10:30am.  Patient understood and confirmed.

## 2022-01-08 ENCOUNTER — Inpatient Hospital Stay: Payer: Medicare Other | Attending: Hematology and Oncology

## 2022-01-08 ENCOUNTER — Other Ambulatory Visit: Payer: Self-pay

## 2022-01-08 DIAGNOSIS — Z8542 Personal history of malignant neoplasm of other parts of uterus: Secondary | ICD-10-CM | POA: Insufficient documentation

## 2022-01-08 DIAGNOSIS — Z90722 Acquired absence of ovaries, bilateral: Secondary | ICD-10-CM | POA: Insufficient documentation

## 2022-01-08 DIAGNOSIS — Z452 Encounter for adjustment and management of vascular access device: Secondary | ICD-10-CM | POA: Diagnosis not present

## 2022-01-08 DIAGNOSIS — Z90710 Acquired absence of both cervix and uterus: Secondary | ICD-10-CM | POA: Diagnosis not present

## 2022-01-08 DIAGNOSIS — Z923 Personal history of irradiation: Secondary | ICD-10-CM | POA: Diagnosis not present

## 2022-01-08 DIAGNOSIS — C55 Malignant neoplasm of uterus, part unspecified: Secondary | ICD-10-CM

## 2022-01-08 DIAGNOSIS — Z86718 Personal history of other venous thrombosis and embolism: Secondary | ICD-10-CM | POA: Insufficient documentation

## 2022-01-08 DIAGNOSIS — Z9221 Personal history of antineoplastic chemotherapy: Secondary | ICD-10-CM | POA: Insufficient documentation

## 2022-01-08 MED ORDER — SODIUM CHLORIDE 0.9% FLUSH
10.0000 mL | Freq: Once | INTRAVENOUS | Status: AC
  Administered 2022-01-08: 10 mL

## 2022-01-08 MED ORDER — HEPARIN SOD (PORK) LOCK FLUSH 100 UNIT/ML IV SOLN
500.0000 [IU] | Freq: Once | INTRAVENOUS | Status: AC
  Administered 2022-01-08: 500 [IU]

## 2022-01-09 ENCOUNTER — Encounter: Payer: Self-pay | Admitting: Psychiatry

## 2022-01-13 NOTE — Progress Notes (Signed)
GYNECOLOGIC ONCOLOGY NEW PATIENT CONSULTATION  Date of Service: 01/14/2022 Referring Provider: [name], MD   ASSESSMENT AND PLAN: [name] is a 76 y.o. 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  Now currently off of treatment.  She presents for reestablishing of care and ongoing endometrial cancer surveillance.  Recurrent endometrial cancer: - Clinically NED on exam today. - Signs and symptoms of recurrence reviewed with patient. - Discussed surveillance every 3 months initially.  Can alternate with Dr. Alvy Bimler which.  She sees Dr. Alvy Bimler such next in March.  I can see her back in approximately 6 months. -Dr. Alvy Bimler which plans next imaging when she sees her in March.  Agree with this plan.  Lower extremity edema and skin changes: - Encourage patient to continue follow-up with her PCP in regards to her lower extremity edema and associated skin changes. - Also with some skin changes in bilateral groins and underside of abdominal pannus that appears consistent with chronic irritation from friction.  Patient reports that she uses a powder at home.  Encouraged that she continue to keep area dry and clean.  No active concern for infection at this time.  RTC in 6 months.  A copy of this note was sent to the patient's referring provider.  Bernadene Bell, MD Gynecologic Oncology   Medical Decision Making I personally spent  TOTAL 45 minutes face-to-face and non-face-to-face in the care of this patient, which includes all pre, intra, and post visit time on the date of service.  5 minutes spent reviewing records prior to the visit 35 Minutes in patient contact 5 minutes charting , conferring with consultants etc.   ------------  CC: Reestablish care, recurrent endometrial cancer  HISTORY OF PRESENT ILLNESS:  [name]  is a 76 y.o. woman who is seen in consultation at the request of [name], MD for evaluation of endometrial cancer surveillance.  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 This was initially diagnosed and treated in 2012 with a vaginal hysterectomy and BSO (<50% myometrial invasion, -LVSI). She did not receive any adjuvant treatment at that time. She was then found to have a recurrence diagnosed in 2020. At that time she was started on treatment with 3 cycles of carb/tax. She also received radiation to the left pelvis (06/30/2018 - 07/21/2018), 35 Gy in 14 fractions. Her tumor was noted to be MMRd/MSI-H and was then continued on pembrolizumab for the past 3 years. Most recently her imaging in September was stable without concern for active disease at this time.   Today patient reports that she is not experiencing any vaginal bleeding, abdominal or pelvic pain, change in bowel or bladder habits, nausea or vomiting, or issues with her appetite.  She does report significant hip issues for which she sees an orthopedic.  She has previously had her knees replaced but is not sure she wants to get a hip replacement.   TREATMENT HISTORY: Oncology History Overview Note  Hx of endometrioid cancer in 2012 (FIGO grade II, T1aNxMx), recurrent disease in 2020 MMR: abnormal MSI: High Genetics are negative   Uterine cancer ([place])  07/03/2010 Pathology Results   1. Uterus +/- tubes/ovaries, neoplastic, with left fallopian tube and ovary - INVASIVE ENDOMETRIOID CARCINOMA (1.5 CM), FIGO GRADE II, ARISING IN A BACKGROUND OF ATYPICAL COMPLEX HYPERPLASIA, CONFINED WITHIN INNER HALF OF THE  MYOMETRIUM. - ENDOMETRIAL POLYP WITH ASSOCIATED ATYPICAL COMPLEX HYPERPLASIA. - MYOMETRIUM: LEIOMYOMATA. - CERVIX: BENIGN SQUAMOUS MUCOSA AND ENDOCERVICAL MUCOSA, NO DYSPLASIA OR MALIGNANCY. - LEFT OVARY: BENIGN OVARIAN TISSUE WITH ENDOSALPINGOSIS, NO EVIDENCE OF ATYPIA OR MALIGNANCY. - LEFT  FALLOPIAN TUBE: NO HISTOLOGIC ABNORMALITIES. - PLEASE SEE ONCOLOGY TEMPLATE FOR DETAIL. 2. Ovary and fallopian tube, right - BENIGN OVARIAN TISSUE WITH ENDOSALPINGOSIS, NO ATYPIA OR MALIGNANCY. - BENIGN FALLOPIAN TUBAL TISSUE, NO PATHOLOGIC ABNORMALITIES. Microscopic Comment 1. UTERUS Specimen: Uterus, cervix, bilateral ovaries and fallopian tubes Procedure: Total hysterectomy and bilateral salpingo-oophorectomy Lymph node sampling performed: No Specimen integrity: Intact Maximum tumor size (cm): 1.5 cm, glass slide measurement Histologic type: Invasive endometrioid carcinoma Grade: FIGO grade II Myometrial invasion: 1 cm where myometrium is 2.3 cm in thickness Cervical stromal involvement: No Extent of involvement of other organs: No Lymph vascular invasion: Not identified Peritoneal washings: Negative ([id number]) Lymph nodes: number examined N/A; number positive N/A TNM code: pT1a, pNX 1 oFf 3IGO Stage (based on pathologic findings, needs clinical correlation): IA  Comments: Sections the endomyometrium away from the grossly identified endometrial polyp show an invasive FIGO grade II endometrioid carcinoma. The tumor is confined within inner half of the myometrium. No angiolymphatic invasion is identified. No cervical stromal involvement is identified. Sections of the grossly identified endometrial polyp show an endometrial polyp with associated atypical compacted hyperplasia with no definitive evidence of carcinoma.   12/07/2017 Imaging   US venous Doppler Right: No evidence of common femoral vein obstruction. Left: Findings consistent with acute deep vein thrombosis involving the left femoral vein, left proximal profunda vein, and left popliteal vein. Unable to adequately interrogate the common femoral and higher, or the calf secondary to significant edema and body habitus   12/07/2017 [facility] Admission   She presented to the ER and was diagnosed with acute DVT   01/18/2018 -  01/21/2018 Hospital Admission   She was admitted to the hospital for management of severe persistent DVT   01/18/2018 Imaging   US venous Doppler Right: No evidence of common femoral vein obstruction. Left: Findings consistent with acute deep vein thrombosis involving the left common femoral vein, and left popliteal vein.   01/19/2018 Surgery   Pre-operative Diagnosis: Subacute DVT with severe post thrombotic syndrome Post-operative diagnosis:  Same Surgeon:  Erlene Quan C. Donzetta Matters, MD Procedure Performed: 1.  Ultrasound-guided cannulation left small saphenous vein 2.  Left lower extremity and central venography 3.  Intravascular ultrasound of left popliteal, femoral, common femoral, external and common iliac veins and IVC 4.  Stent of left common and external iliac veins with 14 x 60 mm Vici 5.  Moderate sedation with fentanyl and Versed for 50 minutes   Indications: 76 year old female with a history of DVT in October now presents with persistent left lower extremity swelling and ultrasound demonstrating likely persistent DVT.  She has been on Xarelto at this time.  She is now indicated for venogram possible intervention.   Findings: Flow in the left lower extremity was stagnant throughout but by venogram all veins were patent.  There was a focal occlusive area approximately 2 cm in length at the common and external iliac vein junction at the hypogastric on the left.  After stenting and ballooning we had a diameter of 12 millimeters in the stent and venogram demonstrated flow in the lower extremity veins were previously was stagnant and no further residual stenosis in the left common and external iliac vein junction.   04/12/2018 Imaging   US Venous Doppler  Right: No evidence of common femoral vein obstruction. Left: There is no evidence of deep vein thrombosis in the lower extremity. However, portions of this examination were limited- see technologist comments above. Left groin: Large hypoechoic  area with mixed echoes noted measuring nearly 10 cm. Possible  hematoma versus unknown etiology. Ultrasound characteristics of enlarged lymph nodes noted in the groin.      05/15/2018 Imaging   US Venous Doppler Right: No evidence of deep vein thrombosis in the lower extremity. No indirect evidence of obstruction proximal to the inguinal ligament. Left: No reflux was noted in the common femoral vein , femoral vein in the thigh, popliteal vein, great saphenous vein at the saphenofemoral junction, great saphenous vein at the proximal thigh, great saphenous vein at the mid thigh, great saphenous vein  at the distal thigh, great saphenous vein at the knee, origin of the small saphenous vein, proximal small saphenous vein, and mid small saphenous vein. There is no evidence of deep vein thrombosis in the lower extremity. There is no evidence of superficial venous thrombosis. No cystic structure found in the popliteal fossa. Unable to evaluate extension of common femoral vein obstruction proximal to the inguinal ligament.   06/01/2018 Imaging   1. 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 Additionally, there is lucency involving the medial sidewall of the left acetabulum with potential nondisplaced pathologic fracture. Further evaluation with contrast-enhanced pelvic MRI could be performed as clinically indicated. 2. The left pelvic arterial and venous system is encased by this infiltrative left pelvic sidewall mass however while difficult to ascertain, the left external iliac venous stent appears patent.   06/18/2018 Pathology Results   Lymph node for lymphoma, Left Inguinal - METASTATIC ADENOCARCINOMA, SEE COMMENT. Microscopic Comment Immunohistochemistry is positive for cytokeratin 7, PAX8, ER, and PR. Cytokeratin 5/6,and p63 are negative. The immunoprofile along with the patient's history are  consistent with a gynecologic primary.   06/18/2018 Surgery   Pre-op Diagnosis: INGUINAL LYMPHADENOPATHY, PELVIC MASS      Procedure(s): EXCISIONAL BIOPSY DEEP LEFT INGUINAL LYMPH NODE   Surgeon(s): Coralie Keens, MD      06/24/2018 Cancer Staging   Staging form: Corpus Uteri - Carcinoma and Carcinosarcoma, AJCC 8th Edition - Clinical: Stage IVB (cT1a, cN2, pM1) - Signed by [name], MD on 06/24/2018    Genetic Testing   Patient has genetic testing done for MMR on pathology from 06/18/2018. Results revealed patient has the following mutation(s): MMR: abnormal   06/29/2018 Procedure   Placement of a subcutaneous port device. Catheter tip at the SVC and right atrium junction.    Genetic Testing   Patient has genetic testing done for MSI on pathology from 06/18/2018. Results revealed patient has the following mutation(s): MSI: High   07/02/2018 PET scan   Previous hysterectomy, with asymmetric focus of hypermetabolic activity in the left vaginal cuff, suspicious for residual or recurrent carcinoma.   Large hypermetabolic soft tissue mass involving the left pelvic sidewall and acetabulum, consistent with metastatic disease.   No evidence metastatic disease within the abdomen, chest, or neck.   07/09/2018 Tumor Marker   Patient's tumor was tested for the following markers: CA-125 Results of the tumor marker test revealed 9   07/10/2018 - 08/24/2018 Chemotherapy   The patient had carboplatin and taxol x 3 cycles   07/17/2018 Genetic Testing   Negative genetic testing on the common hereditary cancer panel.  The Common Hereditary Gene Panel  offered by [facility] includes sequencing and/or deletion duplication testing of the following 48 genes: APC, ATM, AXIN2, BARD1, BMPR1A, BRCA1, BRCA2, BRIP1, CDH1, CDK4, CDKN2A (p14ARF), CDKN2A (p16INK4a), CHEK2, CTNNA1, DICER1, EPCAM (Deletion/duplication testing only), GREM1 (promoter region deletion/duplication testing only), KIT, MEN1, MLH1, MSH2,  MSH3, MSH6, MUTYH, NBN, NF1, NHTL1, PALB2, PDGFRA, PMS2, POLD1, POLE, PTEN, RAD50, RAD51C, RAD51D, RNF43, SDHB, SDHC, SDHD, SMAD4, SMARCA4. STK11, TP53, TSC1, TSC2, and VHL.  The following genes were evaluated for sequence changes only: SDHA and HOXB13 c.251G>A variant only. The report date is Jul 17, 2018.    10/03/2018 Imaging   CT abdomen and pelvis 1.  No acute intra-abdominal process. 2. Grossly unchanged left pelvic sidewall mass with osseous involvement of the medial acetabulum. Progressive mild displacement of the associated comminuted pathologic fracture involving the right acetabulum and puboacetabular junction.  3. New venous stents extending from the left common iliac vein origin to the proximal left common femoral vein. The stents are patent.   11/06/2018 -  Chemotherapy   The patient had pembrolizumab for chemotherapy treatment.     01/28/2019 Imaging   1. No substantial interval change in exam. 2. Interval development of mild fullness in the left intrarenal collecting system and ureter without overt hydronephrosis at this time. 3. Abnormal soft tissue along the left pelvic sidewall has decreased slightly in the interval. 4. Similar appearance of ill-defined fascial planes in the pelvis with some peritoneal thickening along the right pelvic sidewall and potentially involving the sigmoid mesocolon. 5. No substantial ascites.   05/03/2019 Imaging   1. Stable mild left pelvic sidewall soft tissue density. No new or progressive disease identified within the abdomen or pelvis.  2. Colonic diverticulosis. No radiographic evidence of diverticulitis.   Aortic Atherosclerosis (ICD10-I70.0).   09/09/2019 Imaging   1. No change in appearance of soft tissue thickening along the LEFT pelvic sidewall adjacent to chronic LEFT acetabular fracture. 2. Mild asymmetry of the bladder wall favoring the LEFT bladder wall, not well assessed. Similar accounting for variable degrees of distension on  prior studies potentially related to prior radiation, attention on follow-up. 3. Signs of venous stenting in the LEFT hemipelvis with LEFT lower extremity muscular atrophy and mild stranding with similar appearance. Signs of colonic diverticulosis and diverticular disease without change.   02/24/2020 Imaging   1. Unchanged appearance of the pelvis as detailed below. 2. Unchanged soft tissue thickening of the left pelvic sidewall. 3. 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 4. No evidence discrete mass or lymphadenopathy nor metastatic disease in the abdomen or pelvis. 5. Status post hysterectomy and cholecystectomy. 6. Left common iliac vein stent. 7. Pancolonic diverticulosis.     09/07/2020 Imaging   Stable abnormal soft tissue density in the left pelvic sidewall. No new or progressive disease within the abdomen or pelvis.   Colonic diverticulosis. No radiographic evidence of diverticulitis.   Stable severe destructive left hip arthropathy with fracture involving the medial acetabular wall.     03/01/2021 Imaging   Stable abnormal soft tissue density in the left pelvic sidewall. 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   No new or progressive disease within the abdomen or pelvis.   Colonic diverticulosis, without radiographic evidence of diverticulitis.   Tiny hiatal hernia.   11/08/2021 Imaging   1. Stable severe left hip arthropathy with protrusio, sclerosis, articular irregularity, spurring, and chronic demineralization of the quadrilateral plate  with adjacent soft tissue thickening along the left pelvic sidewall which is not changed. 2. Cannot exclude wall thickening in the lower rectum, correlate with digital rectal exam. 3. Other imaging findings of potential clinical significance: Small type 1 hiatal hernia. Colonic diverticulosis. Left common iliac vein  and external iliac vein stent graft. 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 Small umbilical hernia contains adipose tissue.   Metastasis to lymph nodes ([place])  06/23/2018 Initial Diagnosis   Metastasis to lymph nodes ([place])   07/10/2018 - 08/24/2018 Chemotherapy   The patient had palonosetron (ALOXI) injection 0.25 mg, 0.25 mg, Intravenous,  Once, 3 of 6 cycles Administration: 0.25 mg (07/10/2018), 0.25 mg (07/31/2018), 0.25 mg (08/24/2018) CARBOplatin (PARAPLATIN) 480 mg in sodium chloride 0.9 % 250 mL chemo infusion, 480 mg (100 % of original dose 482.5 mg), Intravenous,  Once, 3 of 6 cycles Dose modification: 482.5 mg (original dose 482.5 mg, Cycle 1) Administration: 480 mg (07/10/2018), 480 mg (07/31/2018), 480 mg (08/24/2018) PACLitaxel (TAXOL) 276 mg in sodium chloride 0.9 % 250 mL chemo infusion (> 60m/m2), 140 mg/m2 = 276 mg (80 % of original dose 175 mg/m2), Intravenous,  Once, 3 of 6 cycles Dose modification: 140 mg/m2 (80 % of original dose 175 mg/m2, Cycle 1, Reason: Dose Not Tolerated) Administration: 276 mg (07/10/2018), 276 mg (07/31/2018), 276 mg (08/24/2018) fosaprepitant (EMEND) 150 mg, dexamethasone (DECADRON) 12 mg in sodium chloride 0.9 % 145 mL IVPB, , Intravenous,  Once, 3 of 6 cycles Administration:  (07/10/2018),  (07/31/2018),  (08/24/2018)  for chemotherapy treatment.    11/06/2018 -  Chemotherapy   The patient had pembrolizumab for chemotherapy treatment.     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  06/24/2018 Initial Diagnosis   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   07/10/2018 - 08/24/2018 Chemotherapy   The patient had palonosetron (ALOXI) injection 0.25 mg, 0.25 mg, Intravenous,  Once, 3 of 6 cycles Administration: 0.25 mg (07/10/2018), 0.25 mg (07/31/2018), 0.25 mg (08/24/2018) CARBOplatin (PARAPLATIN) 480 mg in sodium chloride 0.9 % 250 mL chemo infusion, 480 mg (100 % of original dose 482.5 mg),  Intravenous,  Once, 3 of 6 cycles Dose modification: 482.5 mg (original dose 482.5 mg, Cycle 1) Administration: 480 mg (07/10/2018), 480 mg (07/31/2018), 480 mg (08/24/2018) PACLitaxel (TAXOL) 276 mg in sodium chloride 0.9 % 250 mL chemo infusion (> 821mm2), 140 mg/m2 = 276 mg (80 % of original dose 175 mg/m2), Intravenous,  Once, 3 of 6 cycles Dose modification: 140 mg/m2 (80 % of original dose 175 mg/m2, Cycle 1, Reason: Dose Not Tolerated) Administration: 276 mg (07/10/2018), 276 mg (07/31/2018), 276 mg (08/24/2018) fosaprepitant (EMEND) 150 mg, dexamethasone (DECADRON) 12 mg in sodium chloride 0.9 % 145 mL IVPB, , Intravenous,  Once, 3 of 6 cycles Administration:  (07/10/2018),  (07/31/2018),  (08/24/2018)  for chemotherapy treatment.    11/06/2018 -  Chemotherapy   The patient had pembrolizumab for chemotherapy treatment.     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   Primary localized osteoarthritis of right knee    Uterine cancer ([place]) 06/23/2018    PAST SURGICAL HISTORY: Past Surgical History:  Procedure Laterality Date   ABDOMINAL HYSTERECTOMY  2012   CHOLECYSTECTOMY N/A 03/09/2013   Procedure:  LAPAROSCOPIC CHOLECYSTECTOMY;  Surgeon: Gayland Curry, MD;  Location: [facility];  Service: General;  Laterality: N/A;   COLONOSCOPY W/ BIOPSIES     IR IMAGING GUIDED PORT INSERTION  06/29/2018   LARYNGOSCOPY Left 03/14/2017   Procedure: LARYNGOSCOPY;  Surgeon: Helayne Seminole, MD;  Location: [facility];  Service: ENT;  Laterality: Left;   LOWER EXTREMITY VENOGRAPHY Left 01/19/2018   Procedure: LOWER EXTREMITY VENOGRAPHY;  Surgeon: Waynetta Sandy, MD;  Location: [facility];  Service: Cardiovascular;  Laterality: Left;   LOWER EXTREMITY VENOGRAPHY N/A 09/08/2018   Procedure: LOWER EXTREMITY VENOGRAPHY;  Surgeon: Waynetta Sandy, MD;  Location: [facility];  Service: Cardiovascular;  Laterality: N/A;   LYMPH NODE BIOPSY Left 06/18/2018   Procedure: EXCISIONAL BIOPSY LEFT INGUINAL LYMPH NODE;  Surgeon: Coralie Keens, MD;  Location: [facility];  Service: General;  Laterality: Left;   PERIPHERAL VASCULAR INTERVENTION Left 01/19/2018   Procedure: PERIPHERAL VASCULAR INTERVENTION;  Surgeon: Waynetta Sandy, MD;  Location: [facility];  Service: Cardiovascular;  Laterality: Left;  LEFT ILIAC VENOUS   PERIPHERAL VASCULAR INTERVENTION Left 09/08/2018   Procedure: PERIPHERAL VASCULAR INTERVENTION;  Surgeon: Waynetta Sandy, MD;  Location: [facility];  Service: Cardiovascular;  Laterality: Left;  lower extremity   TOTAL KNEE ARTHROPLASTY  04/29/2011   Procedure: TOTAL KNEE ARTHROPLASTY;  Surgeon: Lorn Junes, MD;  Location: [facility];  Service: Orthopedics;  Laterality: Left;  DR Ephriam Knuckles 90 MINUTES FOR THIS CASE   TOTAL KNEE ARTHROPLASTY Right 07/04/2014   Procedure: TOTAL KNEE ARTHROPLASTY;  Surgeon: Elsie Saas, MD;  Location: [facility];  Service: Orthopedics;  Laterality: Right;    OB/GYN HISTORY: OB History  Gravida Para Term Preterm AB Living  _0 SAB IAB Ectopic Multiple Live Births          1    # Outcome Date GA Lbr Len/2nd  Weight Sex Delivery Anes PTL Lv  1 Term      Vag-Spont   LIV      SCREENING STUDIES:  Last mammogram: 2021 per patient report Last colonoscopy: 2018  MEDICATIONS:  Current Outpatient Medications:    apixaban (ELIQUIS) 2.5 MG TABS tablet, Take by mouth 2 (two) times daily., Disp: , Rfl:    diclofenac sodium (VOLTAREN) 1 % GEL, APPLY 4GRAMS 4 TIMES A DAY AS NEEDED FOR PAINS, Disp: , Rfl:    furosemide (LASIX) 40 MG tablet, Take 40 mg by mouth daily as needed., Disp: , Rfl:    gabapentin (NEURONTIN) 300 MG capsule, Take 300 mg by mouth 3 (three) times daily., Disp: , Rfl:    lidocaine-prilocaine (EMLA) cream, Apply 1 application topically daily as needed., Disp: 30 g, Rfl: 3   methadone (DOLOPHINE) 10 MG tablet, Take 1 tablet (10 mg  total) by mouth every 12 (twelve) hours., Disp: 60 tablet, Rfl: 0   morphine (MSIR) 15 MG tablet, Take 1 tablet (15 mg total) by mouth every 6 (six) hours as needed for severe pain., Disp: 60 tablet, Rfl: 0   Olopatadine HCl 0.2 % SOLN, Place 1 drop into both eyes daily., Disp: , Rfl:    potassium chloride (KLOR-CON) 10 MEQ tablet, Take 10 mEq by mouth daily., Disp: , Rfl:   ALLERGIES: No Known Allergies  FAMILY HISTORY: Family History  Problem Relation Age of Onset   Arthritis Mother    Hypertension Mother    Alzheimer's disease Father    Diabetes Sister    Hypertension Sister    Hypertension Sister    Hypertension Sister    Hypertension Sister    Hypertension Brother    Stroke Brother    Hypertension Brother    Breast cancer Niece 6       sister's daughter   Breast cancer Other        Niece   Anesthesia problems Neg Hx    Hypotension Neg Hx    Malignant hyperthermia Neg Hx    Pseudochol deficiency Neg Hx    Colon cancer Neg Hx    Ovarian cancer Neg Hx    Endometrial cancer Neg Hx    Pancreatic cancer Neg Hx    Prostate cancer Neg Hx     SOCIAL HISTORY: Social History   Socioeconomic History   Marital status: Divorced    Spouse  name: Not on file   Number of children: 1   Years of education: Not on file   Highest education level: Not on file  Occupational History   Occupation: Retired   Tobacco Use   Smoking status: Former    Years: 1.00    Types: Cigarettes    Quit date: 04/22/1988    Years since quitting: 33.7   Smokeless tobacco: Never  Vaping Use   Vaping Use: Never used  Substance and Sexual Activity   Alcohol use: No   Drug use: No   Sexual activity: Not Currently    Birth control/protection: Surgical  Other Topics Concern   Not on file  Social History Narrative   Daily caffeine    Social Determinants of Health   Financial Resource Strain: Not on file  Food Insecurity: Not on file  Transportation Needs: Not on file  Physical Activity: Not on file  Stress: Not on file  Social Connections: Not on file  Intimate Partner Violence: Not on file    REVIEW OF SYSTEMS: New patient intake form was reviewed.  Complete 10-system review is negative except for the following: Joint pain, leg swelling, problem walking  PHYSICAL EXAM: BP (!) 159/59 (BP Location: Left Arm, Patient Position: Sitting)   Pulse 87   Temp 98.3 F (36.8 C) (Temporal)   Resp 17   Wt 245 lb (111.1 kg)   SpO2 99%   BMI 46.29 kg/m  Constitutional: No acute distress. Neuro/Psych: Alert, oriented.  Head and Neck: Normocephalic, atraumatic. Neck symmetric without masses. Sclera anicteric.  Respiratory:Normal work of breathing. Clear to auscultation bilaterally. Cardiovascular: Regular rate and rhythm, no murmurs, rubs, or gallops. Abdomen: Normoactive bowel sounds. Soft, non-distended, non-tender to palpation. Extremities: Using walker.  Some limited mobility due to left hip pain.  Darkened and thickened/rough skin of bilateral lower extremities and edema equal bilaterally. Skin: Irritation and superficial breakdown in bilateral groins and overlying abdominal pannus, appears due to chronic friction Lymphatic: No cervical,  supraclavicular, adenopathy.  Thickening and firmness of left groin underlying incision without discrete lymphadenopathy. Genitourinary:External genitalia without lesions. Urethral meatus without lesions or prolapse. On speculum exam, normal vaginal mucosa with smooth vaginal cuff without lesion.  Bimanual exam reveals smooth vaginal cuff.  No pelvic masses or tenderness.  Exam limited by patient's left hip pain.  Exam chaperoned by Alfredo Martinez, NT   LABORATORY AND RADIOLOGIC DATA: Outside medical records were reviewed to synthesize the above history, along with the history and physical obtained during the visit.  Outside laboratory, pathology, and imaging reports were reviewed, with pertinent results below.  I personally reviewed the outside images.  WBC  Date Value Ref Range Status  10/19/2021 3.6 (L) 4.0 - 10.5 K/uL Final   Hemoglobin  Date Value Ref Range Status  10/19/2021 11.1 (L) 12.0 - 15.0 g/dL Final  12/24/2019 11.8 (L) 12.0 - 15.0 g/dL Final   HCT  Date Value Ref Range Status  10/19/2021 33.4 (L) 36.0 - 46.0 % Final   Platelets  Date Value Ref Range Status  10/19/2021 211 150 - 400 K/uL Final   Platelet Count  Date Value Ref Range Status  12/24/2019 187 150 - 400 K/uL Final   Magnesium  Date Value Ref Range Status  07/19/2018 1.9 1.7 - 2.4 mg/dL Final    Comment:    Performed at [facility], [place] [address] [place],  [address]   Creatinine  Date Value Ref Range Status  11/30/2020 0.57 0.44 - 1.00 mg/dL Final   Creatinine, Ser  Date Value Ref Range Status  10/19/2021 0.71 0.44 - 1.00 mg/dL Final   AST  Date Value Ref Range Status  10/19/2021 11 (L) 15 - 41 U/L Final  11/30/2020 13 (L) 15 - 41 U/L Final   ALT  Date Value Ref Range Status  10/19/2021 6 0 - 44 U/L Final  11/30/2020 9 0 - 44 U/L Final    CT ABDOMEN PELVIS W CONTRAST 11/06/2021  Narrative CLINICAL DATA:  Restaging metastatic endometrial carcinoma.  *  Tracking Code: BO *  EXAM: CT ABDOMEN AND PELVIS WITH CONTRAST  TECHNIQUE: Multidetector CT imaging of the abdomen and pelvis was performed using the standard protocol following bolus administration of intravenous contrast.  RADIATION DOSE REDUCTION: This exam was performed according to the departmental dose-optimization program which includes automated exposure control, adjustment of the mA and/or kV according to patient size and/or use of iterative reconstruction technique.  CONTRAST:  125m OMNIPAQUE IOHEXOL 300 MG/ML  SOLN  COMPARISON:  Multiple exams, including 02/28/2021  FINDINGS: Despite efforts by the technologist and patient, motion artifact is present on today's exam and could not be eliminated. This reduces exam sensitivity and specificity.  Lower chest: Small type 1 hiatal hernia.  Hepatobiliary: Cholecystectomy.  Otherwise unremarkable.  Pancreas: Unremarkable  Spleen: Unremarkable  Adrenals/Urinary Tract: Unremarkable  Stomach/Bowel: Colonic diverticulosis. Cannot exclude wall thickening in the lower rectum. Wall thickening in the sigmoid colon for example on image 55 series 2 is similar to prior and may be secondary to the more prominent diverticulosis in this vicinity, although remains technically nonspecific.  Vascular/Lymphatic: Left common iliac vein and external iliac vein stent graft. Patency poorly assessed due to early contrast phase but probably patent.  Reproductive: Uterus absent.  Adnexa unremarkable.  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 Unchanged from prior.  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 Stable  demineralized upper quadrilateral space with soft tissue density in the adjacent left pelvic sidewall and extending down along the upper margin of the left obturator  internus, this thickening measures about 1.3 cm on image 57 series 2, formerly the same.  Mild sclerosis along the left SI joint. Extensive lumbar spurring favoring diffuse idiopathic skeletal hyperostosis. Multilevel foraminal impingement in the lumbar spine.  Small umbilical hernia contains adipose tissue.  IMPRESSION: 1. 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 2. Cannot exclude wall thickening in the lower rectum, correlate with digital rectal exam. 3. Other imaging findings of potential clinical significance: Small type 1 hiatal hernia. Colonic diverticulosis. Left common iliac vein and external iliac vein stent graft. 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 Small umbilical hernia contains adipose tissue.   Electronically Signed By: Van Clines M.D. On: 11/07/2021 15:15

## 2022-01-14 ENCOUNTER — Inpatient Hospital Stay (HOSPITAL_BASED_OUTPATIENT_CLINIC_OR_DEPARTMENT_OTHER): Payer: Medicare Other | Admitting: Psychiatry

## 2022-01-14 ENCOUNTER — Encounter: Payer: Self-pay | Admitting: Psychiatry

## 2022-01-14 VITALS — BP 159/59 | HR 87 | Temp 98.3°F | Resp 17 | Wt 245.0 lb

## 2022-01-14 DIAGNOSIS — Z86718 Personal history of other venous thrombosis and embolism: Secondary | ICD-10-CM | POA: Diagnosis not present

## 2022-01-14 DIAGNOSIS — I825Z2 Chronic embolism and thrombosis of unspecified deep veins of left distal lower extremity: Secondary | ICD-10-CM

## 2022-01-14 DIAGNOSIS — S81809S Unspecified open wound, unspecified lower leg, sequela: Secondary | ICD-10-CM

## 2022-01-14 DIAGNOSIS — C541 Malignant neoplasm of endometrium: Secondary | ICD-10-CM | POA: Diagnosis not present

## 2022-01-14 DIAGNOSIS — Z8542 Personal history of malignant neoplasm of other parts of uterus: Secondary | ICD-10-CM | POA: Diagnosis not present

## 2022-01-14 NOTE — Patient Instructions (Signed)
It was a pleasure to see you in clinic today. - Exam is reassuring today - Return visit planned for 6 months  Thank you very much for allowing me to provide care for you today.  I appreciate your confidence in choosing our Gynecologic Oncology team at [facility].  If you have any questions about your visit today please call our office or send [place] a MyChart message and we will get back to you as soon as possible.

## 2022-01-25 ENCOUNTER — Telehealth: Payer: Self-pay

## 2022-01-25 DIAGNOSIS — I82422 Acute embolism and thrombosis of left iliac vein: Secondary | ICD-10-CM

## 2022-01-25 NOTE — Telephone Encounter (Signed)
Pt called c/o tingling in legs and requested earlier appt than January.  Reviewed pt's chart, returned call for clarification, two identifiers used. Pt c/o tingling in legs and a feeling of "something running down her leg" intermittently. Pt had recall for January. Appts scheduled from recall for a month earlier. Fasting [place] instructions given to her and her caregiver. Confirmed understanding.

## 2022-02-12 NOTE — Progress Notes (Unsigned)
Office Note     CC:  follow up Requesting Provider:  Loura Pardon, MD  HPI: [name] is a 76 y.o. (Oct 18, 1945) female who presents for routine follow up. She has history of left common and external iliac vein stenting secondary to invasive endometrial cancer.  She subsequently underwent radiation and had occluded stents.  She then underwent mechanical thrombectomy with repeat stenting down to the common femoral vein in by Dr. Donzetta Matters in 2020.  She did just recently call the office with concerns about tingling in her legs and " feeling like something is crawling down her leg". She otherwise denies any increase in edema of left lower extremity.  She continues to take Plavix and Eliquis.  She remains active with her oncologist with serial scans every 6 months.  She is ambulating without difficulty.  She is wearing compression stockings religiously.   The pt is not on a statin for cholesterol management.  The pt is not on a daily aspirin.   Other AC:  Eliquis The pt is not on meds for hypertension.   The pt is not diabetic.  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   Primary localized osteoarthritis of right knee    Uterine cancer ([place]) 06/23/2018    Past Surgical History:  Procedure Laterality Date   ABDOMINAL HYSTERECTOMY  2012   CHOLECYSTECTOMY N/A 03/09/2013   Procedure: LAPAROSCOPIC CHOLECYSTECTOMY;  Surgeon: Gayland Curry, MD;  Location: [place];  Service: General;  Laterality: N/A;    COLONOSCOPY W/ BIOPSIES     IR IMAGING GUIDED PORT INSERTION  06/29/2018   LARYNGOSCOPY Left 03/14/2017   Procedure: LARYNGOSCOPY;  Surgeon: Helayne Seminole, MD;  Location: [place];  Service: ENT;  Laterality: Left;   LOWER EXTREMITY VENOGRAPHY Left 01/19/2018   Procedure: LOWER EXTREMITY VENOGRAPHY;  Surgeon: Waynetta Sandy, MD;  Location: [place] [facility];  Service: Cardiovascular;  Laterality: Left;   LOWER EXTREMITY VENOGRAPHY N/A 09/08/2018   Procedure: LOWER EXTREMITY VENOGRAPHY;  Surgeon: Waynetta Sandy, MD;  Location: [place] [facility];  Service: Cardiovascular;  Laterality: N/A;   LYMPH NODE BIOPSY Left 06/18/2018   Procedure: EXCISIONAL BIOPSY LEFT INGUINAL LYMPH NODE;  Surgeon: Coralie Keens, MD;  Location: [place];  Service: General;  Laterality: Left;   PERIPHERAL VASCULAR INTERVENTION Left 01/19/2018   Procedure: PERIPHERAL VASCULAR INTERVENTION;  Surgeon: Waynetta Sandy, MD;  Location: [place] [facility];  Service: Cardiovascular;  Laterality: Left;  LEFT ILIAC VENOUS   PERIPHERAL VASCULAR INTERVENTION Left 09/08/2018   Procedure: PERIPHERAL VASCULAR INTERVENTION;  Surgeon: Waynetta Sandy, MD;  Location: [place] [facility];  Service: Cardiovascular;  Laterality: Left;  lower extremity   TOTAL KNEE ARTHROPLASTY  04/29/2011   Procedure: TOTAL KNEE ARTHROPLASTY;  Surgeon: Lorn Junes, MD;  Location: [place];  Service: Orthopedics;  Laterality: Left;  DR  WAINER WANTS 90 MINUTES FOR THIS CASE   TOTAL KNEE ARTHROPLASTY Right 07/04/2014   Procedure: TOTAL KNEE ARTHROPLASTY;  Surgeon: Elsie Saas, MD;  Location: [place];  Service: Orthopedics;  Laterality: Right;    Social History   Socioeconomic History   Marital status: Divorced    Spouse name: Not on file   Number of children: 1   Years of education: Not on file   Highest education level: Not on file  Occupational History   Occupation: Retired   Tobacco Use   Smoking status: Former     Years: 1.00    Types: Cigarettes    Quit date: 04/22/1988    Years since quitting: 33.8   Smokeless tobacco: Never  Vaping Use   Vaping Use: Never used  Substance and Sexual Activity   Alcohol use: No   Drug use: No   Sexual activity: Not Currently    Birth control/protection: Surgical  Other Topics Concern   Not on file  Social History Narrative   Daily caffeine    Social Determinants of Health   Financial Resource Strain: Not on file  Food Insecurity: Not on file  Transportation Needs: Not on file  Physical Activity: Not on file  Stress: Not on file  Social Connections: Not on file  Intimate Partner Violence: Not on file   *** Family History  Problem Relation Age of Onset   Arthritis Mother    Hypertension Mother    Alzheimer's disease Father    Diabetes Sister    Hypertension Sister    Hypertension Sister    Hypertension Sister    Hypertension Sister    Hypertension Brother    Stroke Brother    Hypertension Brother    Breast cancer Niece 19       sister's daughter   Breast cancer Other        Niece   Anesthesia problems Neg Hx    Hypotension Neg Hx    Malignant hyperthermia Neg Hx    Pseudochol deficiency Neg Hx    Colon cancer Neg Hx    Ovarian cancer Neg Hx    Endometrial cancer Neg Hx    Pancreatic cancer Neg Hx    Prostate cancer Neg Hx     Current Outpatient Medications  Medication Sig Dispense Refill   apixaban (ELIQUIS) 2.5 MG TABS tablet Take by mouth 2 (two) times daily.     diclofenac sodium (VOLTAREN) 1 % GEL APPLY 4GRAMS 4 TIMES A DAY AS NEEDED FOR PAINS     furosemide (LASIX) 40 MG tablet Take 40 mg by mouth daily as needed.     gabapentin (NEURONTIN) 300 MG capsule Take 300 mg by mouth 3 (three) times daily.     lidocaine-prilocaine (EMLA) cream Apply 1 application topically daily as needed. 30 g 3   methadone (DOLOPHINE) 10 MG tablet Take 1 tablet (10 mg total) by mouth every 12 (twelve) hours. 60 tablet 0   morphine (MSIR) 15 MG  tablet Take 1 tablet (15 mg total) by mouth every 6 (six) hours as needed for severe pain. 60 tablet 0   Olopatadine HCl 0.2 % SOLN Place 1 drop into both eyes daily.     potassium chloride (KLOR-CON) 10 MEQ tablet Take 10 mEq by mouth daily.     No current facility-administered medications for this visit.    No Known Allergies   REVIEW OF SYSTEMS:  *** '[X]'$  denotes positive finding, '[ ]'$  denotes negative finding Cardiac  Comments:  Chest  pain or chest pressure:    Shortness of breath upon exertion:    Short of breath when lying flat:    Irregular heart rhythm:        Vascular    Pain in calf, thigh, or hip brought on by ambulation:    Pain in feet at night that wakes you up from your sleep:     Blood clot in your veins:    Leg swelling:         Pulmonary    Oxygen at home:    Productive cough:     Wheezing:         Neurologic    Sudden weakness in arms or legs:     Sudden numbness in arms or legs:     Sudden onset of difficulty speaking or slurred speech:    Temporary loss of vision in one eye:     Problems with dizziness:         Gastrointestinal    Blood in stool:     Vomited blood:         Genitourinary    Burning when urinating:     Blood in urine:        Psychiatric    Major depression:         Hematologic    Bleeding problems:    Problems with blood clotting too easily:        Skin    Rashes or ulcers:        Constitutional    Fever or chills:      PHYSICAL EXAMINATION:  There were no vitals filed for this visit.  General:  WDWN in NAD; vital signs documented above Gait: Not observed HENT: WNL, normocephalic Pulmonary: normal non-labored breathing , without Rales, rhonchi,  wheezing Cardiac: {Desc; regular/irreg:14544} HR, without  Murmurs {With/Without:20273} carotid bruit*** Abdomen: soft, NT, no masses Skin: {With/Without:20273} rashes Vascular Exam/Pulses:  Right Left  Radial {Exam; arterial pulse strength 0-4:30167} {Exam; arterial  pulse strength 0-4:30167}  Ulnar {Exam; arterial pulse strength 0-4:30167} {Exam; arterial pulse strength 0-4:30167}  Femoral {Exam; arterial pulse strength 0-4:30167} {Exam; arterial pulse strength 0-4:30167}  Popliteal {Exam; arterial pulse strength 0-4:30167} {Exam; arterial pulse strength 0-4:30167}  DP {Exam; arterial pulse strength 0-4:30167} {Exam; arterial pulse strength 0-4:30167}  PT {Exam; arterial pulse strength 0-4:30167} {Exam; arterial pulse strength 0-4:30167}   Extremities: {With/Without:20273} ischemic changes, {With/Without:20273} Gangrene , {With/Without:20273} cellulitis; {With/Without:20273} open wounds;  Musculoskeletal: no muscle wasting or atrophy  Neurologic: A&O X 3;  No focal weakness or paresthesias are detected Psychiatric:  The pt has {Desc; normal/abnormal:11317::"Normal"} affect.   Non-Invasive Vascular Imaging:   ***    ASSESSMENT/PLAN:: 76 y.o. female here for follow up for ***   -***   Karoline Caldwell, PA-C [facility] MD:   Scot Dock

## 2022-02-13 ENCOUNTER — Ambulatory Visit (HOSPITAL_COMMUNITY)
Admission: RE | Admit: 2022-02-13 | Discharge: 2022-02-13 | Disposition: A | Discharge status: Home / Self Care | Payer: Medicare Other | Source: Ambulatory Visit | Attending: Vascular Surgery | Admitting: Vascular Surgery

## 2022-02-13 ENCOUNTER — Ambulatory Visit (INDEPENDENT_AMBULATORY_CARE_PROVIDER_SITE_OTHER): Payer: Medicare Other | Admitting: Physician Assistant

## 2022-02-13 VITALS — BP 132/68 | HR 82 | Temp 98.5°F | Ht 60.0 in | Wt 242.0 lb

## 2022-02-13 DIAGNOSIS — I82422 Acute embolism and thrombosis of left iliac vein: Secondary | ICD-10-CM

## 2022-02-13 DIAGNOSIS — I872 Venous insufficiency (chronic) (peripheral): Secondary | ICD-10-CM

## 2022-03-04 ENCOUNTER — Encounter: Payer: Self-pay | Admitting: Hematology and Oncology

## 2022-03-11 ENCOUNTER — Inpatient Hospital Stay: Payer: 59 | Attending: Hematology and Oncology

## 2022-03-11 ENCOUNTER — Other Ambulatory Visit: Payer: Self-pay

## 2022-03-11 DIAGNOSIS — Z452 Encounter for adjustment and management of vascular access device: Secondary | ICD-10-CM | POA: Insufficient documentation

## 2022-03-11 DIAGNOSIS — Z8542 Personal history of malignant neoplasm of other parts of uterus: Secondary | ICD-10-CM | POA: Insufficient documentation

## 2022-03-11 DIAGNOSIS — C55 Malignant neoplasm of uterus, part unspecified: Secondary | ICD-10-CM

## 2022-03-11 MED ORDER — HEPARIN SOD (PORK) LOCK FLUSH 100 UNIT/ML IV SOLN
500.0000 [IU] | Freq: Once | INTRAVENOUS | Status: AC
  Administered 2022-03-11: 500 [IU]

## 2022-03-11 MED ORDER — SODIUM CHLORIDE 0.9% FLUSH
10.0000 mL | Freq: Once | INTRAVENOUS | Status: AC
  Administered 2022-03-11: 10 mL

## 2022-03-27 ENCOUNTER — Telehealth: Payer: Self-pay

## 2022-03-27 NOTE — Telephone Encounter (Signed)
-----   Message from [name], MD sent at 03/27/2022 10:36 AM EST ----- Can you call or help her schedule CT for 3/22?

## 2022-03-27 NOTE — Telephone Encounter (Signed)
Called and given appt for CT on 3/22 at 1200, arrive at 0930 for port lab flush and go to radiology at [facility] at 10 am to drink contrast, NPO 4 hours prior to CT. Rescheduled MD appt witth Dr. Alvy Bimler due to trip out of town per her request. She is aware of appts on 4/2 at 1020.

## 2022-04-09 ENCOUNTER — Ambulatory Visit
Admission: RE | Admit: 2022-04-09 | Discharge: 2022-04-09 | Disposition: A | Discharge status: Home / Self Care | Payer: 59 | Source: Ambulatory Visit | Attending: Family Medicine | Admitting: Family Medicine

## 2022-04-09 ENCOUNTER — Other Ambulatory Visit: Payer: Self-pay | Admitting: Family Medicine

## 2022-04-09 DIAGNOSIS — L03119 Cellulitis of unspecified part of limb: Secondary | ICD-10-CM

## 2022-05-09 ENCOUNTER — Encounter (HOSPITAL_BASED_OUTPATIENT_CLINIC_OR_DEPARTMENT_OTHER): Payer: 59 | Attending: Internal Medicine | Admitting: Internal Medicine

## 2022-05-09 DIAGNOSIS — I890 Lymphedema, not elsewhere classified: Secondary | ICD-10-CM | POA: Diagnosis present

## 2022-05-09 DIAGNOSIS — Z09 Encounter for follow-up examination after completed treatment for conditions other than malignant neoplasm: Secondary | ICD-10-CM | POA: Diagnosis not present

## 2022-05-10 ENCOUNTER — Inpatient Hospital Stay: Payer: 59 | Attending: Hematology and Oncology

## 2022-05-10 ENCOUNTER — Other Ambulatory Visit: Payer: Self-pay

## 2022-05-10 ENCOUNTER — Ambulatory Visit (HOSPITAL_COMMUNITY)
Admission: RE | Admit: 2022-05-10 | Discharge: 2022-05-10 | Disposition: A | Discharge status: Home / Self Care | Payer: 59 | Source: Ambulatory Visit | Attending: Hematology and Oncology | Admitting: Hematology and Oncology

## 2022-05-10 DIAGNOSIS — K449 Diaphragmatic hernia without obstruction or gangrene: Secondary | ICD-10-CM | POA: Diagnosis not present

## 2022-05-10 DIAGNOSIS — Z79899 Other long term (current) drug therapy: Secondary | ICD-10-CM | POA: Insufficient documentation

## 2022-05-10 DIAGNOSIS — Z8542 Personal history of malignant neoplasm of other parts of uterus: Secondary | ICD-10-CM | POA: Insufficient documentation

## 2022-05-10 DIAGNOSIS — C55 Malignant neoplasm of uterus, part unspecified: Secondary | ICD-10-CM

## 2022-05-10 DIAGNOSIS — E039 Hypothyroidism, unspecified: Secondary | ICD-10-CM

## 2022-05-10 LAB — CBC WITH DIFFERENTIAL/PLATELET
Abs Immature Granulocytes: 0.01 10*3/uL (ref 0.00–0.07)
Basophils Absolute: 0 10*3/uL (ref 0.0–0.1)
Basophils Relative: 1 %
Eosinophils Absolute: 0.2 10*3/uL (ref 0.0–0.5)
Eosinophils Relative: 5 %
HCT: 34.1 % — ABNORMAL LOW (ref 36.0–46.0)
Hemoglobin: 11.4 g/dL — ABNORMAL LOW (ref 12.0–15.0)
Immature Granulocytes: 0 %
Lymphocytes Relative: 27 %
Lymphs Abs: 0.9 10*3/uL (ref 0.7–4.0)
MCH: 29.1 pg (ref 26.0–34.0)
MCHC: 33.4 g/dL (ref 30.0–36.0)
MCV: 87 fL (ref 80.0–100.0)
Monocytes Absolute: 0.4 10*3/uL (ref 0.1–1.0)
Monocytes Relative: 12 %
Neutro Abs: 2 10*3/uL (ref 1.7–7.7)
Neutrophils Relative %: 55 %
Platelets: 214 10*3/uL (ref 150–400)
RBC: 3.92 MIL/uL (ref 3.87–5.11)
RDW: 14.6 % (ref 11.5–15.5)
WBC: 3.5 10*3/uL — ABNORMAL LOW (ref 4.0–10.5)
nRBC: 0 % (ref 0.0–0.2)

## 2022-05-10 LAB — COMPREHENSIVE METABOLIC PANEL
ALT: 9 U/L (ref 0–44)
AST: 11 U/L — ABNORMAL LOW (ref 15–41)
Albumin: 4.1 g/dL (ref 3.5–5.0)
Alkaline Phosphatase: 91 U/L (ref 38–126)
Anion gap: 6 (ref 5–15)
BUN: 12 mg/dL (ref 8–23)
CO2: 27 mmol/L (ref 22–32)
Calcium: 9.2 mg/dL (ref 8.9–10.3)
Chloride: 107 mmol/L (ref 98–111)
Creatinine, Ser: 0.71 mg/dL (ref 0.44–1.00)
GFR, Estimated: >60 mL/min (ref >60)
Glucose, Bld: 100 mg/dL — ABNORMAL HIGH (ref 70–99)
Potassium: 4 mmol/L (ref 3.5–5.1)
Sodium: 140 mmol/L (ref 135–145)
Total Bilirubin: 0.4 mg/dL (ref 0.3–1.2)
Total Protein: 7.4 g/dL (ref 6.5–8.1)

## 2022-05-10 LAB — TSH: TSH: 2.038 u[IU]/mL (ref 0.350–4.500)

## 2022-05-10 MED ORDER — HEPARIN SOD (PORK) LOCK FLUSH 100 UNIT/ML IV SOLN
500.0000 [IU] | Freq: Once | INTRAVENOUS | Status: AC
  Administered 2022-05-10: 500 [IU] via INTRAVENOUS

## 2022-05-10 MED ORDER — IOHEXOL 300 MG/ML  SOLN
100.0000 mL | Freq: Once | INTRAMUSCULAR | Status: AC | PRN
  Administered 2022-05-10: 100 mL via INTRAVENOUS

## 2022-05-10 MED ORDER — IOHEXOL 9 MG/ML PO SOLN
1000.0000 mL | ORAL | Status: AC
  Administered 2022-05-10: 1000 mL via ORAL

## 2022-05-10 MED ORDER — SODIUM CHLORIDE 0.9% FLUSH
10.0000 mL | Freq: Once | INTRAVENOUS | Status: AC
  Administered 2022-05-10: 10 mL

## 2022-05-10 MED ORDER — HEPARIN SOD (PORK) LOCK FLUSH 100 UNIT/ML IV SOLN
INTRAVENOUS | Status: AC
  Filled 2022-05-10: qty 5

## 2022-05-10 MED ORDER — IOHEXOL 9 MG/ML PO SOLN
ORAL | Status: AC
  Filled 2022-05-10: qty 1000

## 2022-05-10 MED ORDER — SODIUM CHLORIDE (PF) 0.9 % IJ SOLN
INTRAMUSCULAR | Status: AC
  Filled 2022-05-10: qty 50

## 2022-05-11 NOTE — Progress Notes (Signed)
LORIS, SOLINGER ([id number]) [id number] Page 1 of 4 Visit Report for 05/09/2022 Abuse Risk Screen Details Patient Name: Date of Service: [name], [name]. 05/09/2022 8:00 A Duncan Medical Record Number: [id number] Patient Account Number: [id number] Date of Birth/Sex: Treating RN: 28-Dec-1945 (77 y.o. Tonita Phoenix, Lauren Primary Care Liviya Santini: Loura Pardon Other Clinician: Referring Biana Haggar: Treating Cattie Tineo/Extender: Rhodia Albright, Himanshu Weeks in Treatment: 0 Abuse Risk Screen Items Answer ABUSE RISK SCREEN: Has anyone close to you tried to hurt or harm you recentlyo No Do you feel uncomfortable with anyone in your familyo No Has anyone forced you do things that you didnt want to doo No Electronic Signature(s) Signed: 05/10/2022 11:12:19 AM By: Rhae Hammock RN Entered By: Rhae Hammock on 05/09/2022 08:02:50 -------------------------------------------------------------------------------- Activities of Daily Living Details Patient Name: Date of Service: [name], [name]. 05/09/2022 8:00 A Duncan Medical Record Number: [id number] Patient Account Number: [id number] Date of Birth/Sex: Treating RN: January 15, 1946 (77 y.o. Benjaman Lobe Primary Care Bryndle Corredor: Loura Pardon Other Clinician: Referring Eliott Amparan: Treating Mariachristina Holle/Extender: Rhodia Albright, Himanshu Weeks in Treatment: 0 Activities of Daily Living Items Answer Activities of Daily Living (Please select one for each item) Drive Automobile Completely Able T Medications ake Completely Able Use T elephone Completely Able Care for Appearance Need Assistance Use T oilet Need Assistance Bath / Shower Completely Able Dress Self Completely Able Feed Self Completely Able Walk Need Assistance Get In / Out Bed Completely Able Housework Need Assistance Prepare Meals Completely Car[place] for Self Completely Able Electronic  Signature(s) Signed: 05/10/2022 11:12:19 AM By: Rhae Hammock RN Entered By: Rhae Hammock on 05/09/2022 08:15:57 -------------------------------------------------------------------------------- Education Screening Details Patient Name: Date of Service: [name]. 05/09/2022 8:00 A Duncan Medical Record Number: [id number] Patient Account Number: [id number] Date of Birth/Sex: Treating RN: 10-30-1945 (77 y.o. Benjaman Lobe Primary Care Alhassan Everingham: Loura Pardon Other Clinician: Referring Clarkson Rosselli: Treating Donisha Hoch/Extender: Rhodia Albright, Himanshu Weeks in TreatmentKAISEE, MELILLO ([id number]) [id number] Page 2 of 4 Primary Learner Assessed: Patient Learning Preferences/Education Level/Primary Language Learning Preference: Explanation, Demonstration, Communication Board, Printed Material Highest Education Level: High School Preferred Language: Diplomatic Services operational officer Language Barrier: No Translator Needed: No Memory Deficit: No Emotional Barrier: No Cultural/Religious Beliefs Affecting Medical Care: No Physical Barrier Impaired Vision: No Impaired Hearing: No Decreased Hand dexterity: No Knowledge/Comprehension Knowledge Level: High Comprehension Level: High Ability to understand written instructions: High Ability to understand verbal instructions: High Motivation Anxiety Level: Calm Cooperation: Cooperative Education Importance: Denies Need Interest in Health Problems: Asks Questions Perception: Coherent Willingness to Engage in Self-Management High Activities: Readiness to Engage in Self-Management High Activities: Electronic Signature(s) Signed: 05/10/2022 11:12:19 AM By: Rhae Hammock RN Entered By: Rhae Hammock on 05/09/2022 08:03:21 -------------------------------------------------------------------------------- Fall Risk Assessment Details Patient Name: Date of Service: [name].  05/09/2022 8:00 A Duncan Medical Record Number: [id number] Patient Account Number: [id number] Date of Birth/Sex: Treating RN: 10-12-1945 (77 y.o. Tonita Phoenix, Lauren Primary Care Sherril Heyward: Loura Pardon Other Clinician: Referring Delyla Sandeen: Treating Daelen Belvedere/Extender: Rhodia Albright, Himanshu Weeks in Treatment: 0 Fall Risk Assessment Items Have you had 2 or more falls in the last 12 monthso 0 No Have you had any fall that resulted in injury in the last 12 monthso 0 No FALLS RISK SCREEN History of falling - immediate or within 3 months 0 No Secondary diagnosis (Do you have 2 or more medical diagnoseso) 0 No Ambulatory aid None/bed rest/wheelchair/nurse 0 No Crutches/cane/walker 0 No Furniture  0 No Intravenous therapy Access/Saline/Heparin Lock 0 No Gait/Transferring Normal/ bed rest/ wheelchair 0 No Weak (short steps with or without shuffle, stooped but able to lift head while walking, may seek 0 No support from furniture) Impaired (short steps with shuffle, may have difficulty arising from chair, head down, impaired 0 No balance) Mental Status Oriented to own ability 0 No Overestimates or forgets limitations 0 No Risk Level: Low Risk Score: 0 [name], [name] ([id number]) [phone] [id number] Page 3 of 4 Electronic Signature(s) -------------------------------------------------------------------------------- Foot Assessment Details Patient Name: Date of Service: [name], [name]. 05/09/2022 8:00 A Duncan Medical Record Number: [id number] Patient Account Number: [id number] Date of Birth/Sex: Treating RN: 1945-09-25 (77 y.o. Tonita Phoenix, Lauren Primary Care Alizay Bronkema: Loura Pardon Other Clinician: Referring Artin Mceuen: Treating Burma Ketcher/Extender: Rhodia Albright, Himanshu Weeks in Treatment: 0 Foot Assessment Items Site Locations + = Sensation present, - = Sensation absent, C = Callus, U = Ulcer R = Redness, W = Warmth, Duncan = Maceration, PU =  Pre-ulcerative lesion F = Fissure, S = Swelling, D = Dryness Assessment Right: Left: Other Deformity: No No Prior Foot Ulcer: No No Prior Amputation: No No Charcot Joint: No No Ambulatory Status: Gait: Notes N/A no wounds pt. not diabetic Electronic Signature(s) Signed: 05/10/2022 11:12:19 AM By: Rhae Hammock RN Entered By: Rhae Hammock on 05/09/2022 08:16:15 -------------------------------------------------------------------------------- Nutrition Risk Screening Details Patient Name: Date of Service: [name]. 05/09/2022 8:00 A Duncan Medical Record Number: [id number] Patient Account Number: [id number] Date of Birth/Sex: Treating RN: [birth year] (77 y.o. Benjaman Lobe Primary Care Stanely Sexson: Loura Pardon Other Clinician: Referring Bertina Guthridge: Treating Ellanor Feuerstein/Extender: Rhodia Albright, Himanshu Weeks in Treatment: 0 Height (in): 61 Weight (lbs): 225 Body Mass Index (BMI): 42.5 [name], [name] ([id number]) [phone] [id number] Page 4 of 4 Nutrition Risk Screening Items Score Screening NUTRITION RISK SCREEN: I have an illness or condition that made me change the kind and/or amount of food I eat 0 No I eat fewer than two meals per day 0 No I eat few fruits and vegetables, or milk products 0 No I have three or more drinks of beer, liquor or wine almost every day 0 No I have tooth or mouth problems that make it hard for me to eat 0 No I don't always have enough money to buy the food I need 0 No I eat alone most of the time 0 No I take three or more different prescribed or over-the-counter drugs a day 0 No Without wanting to, I have lost or gained 10 pounds in the last six months 0 No I am not always physically able to shop, cook and/or feed myself 0 No Nutrition Protocols Good Risk Protocol 0 No interventions needed Moderate Risk Protocol High Risk Proctocol Risk Level: Good Risk Score: 0 Electronic Signature(s) Signed:  05/10/2022 11:12:19 AM By: Rhae Hammock RN Entered By: Rhae Hammock on 05/09/2022 08:03:33

## 2022-05-11 NOTE — Progress Notes (Signed)
Leslie, Duncan ([id number]) [id number] Page 1 of 6 Visit Report for 05/09/2022 Chief Complaint Document Details Patient Name: Date of Service: [name], [name]. 05/09/2022 8:00 A M Medical Record Number: [id number] Patient Account Number: [id number] Date of Birth/Sex: Treating RN: 1945-11-14 (77 y.o. F) Primary Care Provider: Loura Pardon Other Clinician: Referring Provider: Treating Provider/Extender: Rhodia Albright, Himanshu Weeks in Treatment: 0 Information Obtained from: Patient Chief Complaint 05/09/2022; history of wound to the right posterior leg in the setting of lymphedema Electronic Signature(s) Signed: 05/09/2022 1:29:58 PM By: Kalman Shan DO Entered By: Kalman Shan on 05/09/2022 09:07:35 -------------------------------------------------------------------------------- HPI Details Patient Name: Date of Service: [name]. 05/09/2022 8:00 A M Medical Record Number: [id number] Patient Account Number: [id number] Date of Birth/Sex: Treating RN: 1945-09-17 (77 y.o. F) Primary Care Provider: Loura Pardon Other Clinician: Referring Provider: Treating Provider/Extender: Rhodia Albright, Himanshu Weeks in Treatment: 0 History of Present Illness HPI Description: 77 year old female presents with left leg wound present at least since May that has been getting worse, in July she was admitted for occlusion of left lower extremity venous stent that was reopened by venoplasty by Dr. Gwenlyn Saran. Patient was on Coumadin for chronic DVT but switch to Eliquis since July. Patient was undergoing treatment for uterine cancer but the chemotherapy is on hold until wound healing is assured. Patient has fair amount of discomfort in the left leg due to the wounds. She also states her left leg has been more swollen than usual although both legs are fairly big Patient is ABI in the clinic today is 1.1 on the left Patient is history of has  history of IVC filter placement, DVT that was totally occlusive on the left that required stenting, that restenosed and required recent management Patient is currently set up with home health with wound care 8/20; this is a patient with a complicated venous history. This apparently includes a chronic IVC filter and a stent in her left iliac vein. She was undergoing chemotherapy for uterine cancer but that is on hold until a fairly large but superficial area on her left anterior tibial area is healed. She is I think booked tomorrow for a thrombolyzes attempt of this stent in the left iliac vein. As such we should be able to wrap her today on her lower extremity. Also problematic is at home health came on Wednesday and only put kerlix on her 8/27; this is a patient who underwent a left common external iliac vein stent for what was thought to be a clot but turned out to be stagnant flow secondary to invasive endometrial cancer. She then underwent radiation therapy and was found to have an occlusive stent. She subsequently underwent mechanical thrombectomy with repeat stenting of the left common external leg veins. She has had an IVC iliac study which demonstrates patent common external iliac vein stents on the left. The greater saphenous vein and saphenofemoral junction does appear occluded. They will see her again in 4 to 6 months. The patient's wound on the left anterior and left lateral lower leg looks really quite healthy. Most of this is 100% epithelialized. She will need bilateral juxta lite stockings which we are going to go ahead and try to order for her today. We have discharged home health who did not show up last week anyway 9/3; the patient came in with a left anterior tibial area wound is closed. She did not come with stockings. We ordered her juxta lite stockings last week and told her that  the right leg would not be covered brackets no open wound] and depending on the insurance she may have  some coverage for the left leg. Her insurance call they do not cover this she comes in with no stocking. 9/10-Patient returns to clinic for having the juxta lights put on, she does have the stockings with her today. Her wounds are healed 05/09/2022 [name] is a 77 year old female with a past medical history of left leg DVT endometrial cancer, lymphedema that presents to the clinic for a previous , wound to the posterior right leg. She states that an area had blistered up and opened Several weeks ago but has since healed. She does not wear compression stockings. She states they are too difficult to put on. I recommended stocking device to help with this however she states she has this and it also is not sufficient. Currently she denies signs of infection. Electronic Signature(s) Signed: 05/09/2022 1:29:58 PM By: Kalman Shan DO Entered By: Kalman Shan on 05/09/2022 09:12:08 Odette Horns ([id number] Page 2 of 6 -------------------------------------------------------------------------------- Physical Exam Details Patient Name: Date of Service: Duncan, Leslie. 05/09/2022 8:00 A M Medical Record Number: [id number] Patient Account Number: [id number] Date of Birth/Sex: Treating RN: Oct 10, 1945 (77 y.o. F) Primary Care Provider: Loura Pardon Other Clinician: Referring Provider: Treating Provider/Extender: Rhodia Albright, Himanshu Weeks in Treatment: 0 Constitutional respirations regular, non-labored and within target range for patient.. Cardiovascular 2+ dorsalis pedis/posterior tibialis pulses. Psychiatric pleasant and cooperative. Notes Right lower extremity with significant lymphedema but no open wounds, drainage or signs of infection including increased warmth, erythema or purulent drainage. No pain reported Electronic Signature(s) Signed: 05/09/2022 1:29:58 PM By: Kalman Shan DO Entered By: Kalman Shan on  05/09/2022 09:12:38 -------------------------------------------------------------------------------- Physician Orders Details Patient Name: Date of Service: [name]. 05/09/2022 8:00 A M Medical Record Number: [id number] Patient Account Number: [id number] Date of Birth/Sex: Treating RN: 1945/09/07 (77 y.o. Benjaman Lobe Primary Care Provider: Loura Pardon Other Clinician: Referring Provider: Treating Provider/Extender: Rhodia Albright, Himanshu Weeks in Treatment: 0 Verbal / Phone Orders: No Diagnosis Coding ICD-10 Coding Code Description I89.0 Lymphedema, not elsewhere classified Discharge From [facility] Discharge from [place] Edema Control - Lymphedema / SCD / Other Elevate legs to the level of the heart or above for 30 minutes daily and/or when sitting for 3-4 times a day throughout the day. Avoid standing for long periods of time. Patient to wear own compression stockings every day. Electronic Signature(s) Signed: 05/09/2022 1:29:58 PM By: Kalman Shan DO Entered By: Kalman Shan on 05/09/2022 09:12:43 -------------------------------------------------------------------------------- Problem List Details Patient Name: Date of Service: [name]. 05/09/2022 8:00 A M Medical Record Number: [id number] Patient Account Number: [id number] Date of Birth/Sex: Treating RN: [birth year] (77 y.o. F) Primary Care Provider: Loura Pardon Other Clinician: Referring Provider: Treating Provider/Extender: Rhodia Albright, Himanshu Weeks in Treatment: 0 Active Problems ICD-10 SHASTA, BARBAREE ([id number]) [id number] Page 3 of 6 Encounter Code Description Active Date MDM Diagnosis I89.0 Lymphedema, not elsewhere classified 05/09/2022 No Yes Inactive Problems Resolved Problems Electronic Signature(s) Signed: 05/09/2022 1:29:58 PM By: Kalman Shan DO Entered By: Kalman Shan on 05/09/2022  08:50:15 -------------------------------------------------------------------------------- Progress Note Details Patient Name: Date of Service: [name]. 05/09/2022 8:00 A M Medical Record Number: [id number] Patient Account Number: [id number] Date of Birth/Sex: Treating RN: [birth year] (77 y.o. F) Primary Care Provider: Loura Pardon Other Clinician: Referring Provider: Treating Provider/Extender: Rhodia Albright, Himanshu Weeks in Treatment: 0 Subjective  Chief Complaint Information obtained from Patient 05/09/2022; history of wound to the right posterior leg in the setting of lymphedema History of Present Illness (HPI) 77 year old female presents with left leg wound present at least since May that has been getting worse, in July she was admitted for occlusion of left lower extremity venous stent that was reopened by venoplasty by Dr. Gwenlyn Saran. Patient was on Coumadin for chronic DVT but switch to Eliquis since July. Patient was undergoing treatment for uterine cancer but the chemotherapy is on hold until wound healing is assured. Patient has fair amount of discomfort in the left leg due to the wounds. She also states her left leg has been more swollen than usual although both legs are fairly big Patient is ABI in the clinic today is 1.1 on the left Patient is history of has history of IVC filter placement, DVT that was totally occlusive on the left that required stenting, that restenosed and required recent management Patient is currently set up with home health with wound care 8/20; this is a patient with a complicated venous history. This apparently includes a chronic IVC filter and a stent in her left iliac vein. She was undergoing chemotherapy for uterine cancer but that is on hold until a fairly large but superficial area on her left anterior tibial area is healed. She is I think booked tomorrow for a thrombolyzes attempt of this stent in the left iliac vein. As such we  should be able to wrap her today on her lower extremity. Also problematic is at home health came on Wednesday and only put kerlix on her 8/27; this is a patient who underwent a left common external iliac vein stent for what was thought to be a clot but turned out to be stagnant flow secondary to invasive endometrial cancer. She then underwent radiation therapy and was found to have an occlusive stent. She subsequently underwent mechanical thrombectomy with repeat stenting of the left common external leg veins. She has had an IVC iliac study which demonstrates patent common external iliac vein stents on the left. The greater saphenous vein and saphenofemoral junction does appear occluded. They will see her again in 4 to 6 months. The patient's wound on the left anterior and left lateral lower leg looks really quite healthy. Most of this is 100% epithelialized. She will need bilateral juxta lite stockings which we are going to go ahead and try to order for her today. We have discharged home health who did not show up last week anyway 9/3; the patient came in with a left anterior tibial area wound is closed. She did not come with stockings. We ordered her juxta lite stockings last week and told her that the right leg would not be covered brackets no open wound] and depending on the insurance she may have some coverage for the left leg. Her insurance call they do not cover this she comes in with no stocking. 9/10-Patient returns to clinic for having the juxta lights put on, she does have the stockings with her today. Her wounds are healed 05/09/2022 Ms. [name] is a 77 year old female with a past medical history of left leg DVT endometrial cancer, lymphedema that presents to the clinic for a previous , wound to the posterior right leg. She states that an area had blistered up and opened Several weeks ago but has since healed. She does not wear compression stockings. She states they are too difficult  to put on. I recommended stocking device to help  with this however she states she has this and it also is not sufficient. Currently she denies signs of infection. Patient History Information obtained from Patient. Allergies No Known Drug Allergies Family History Unknown History. Social History Former smoker - quit over 20 years ago, Marital Status - Single, Alcohol Use - Never, Drug Use - No History, Caffeine Use - Moderate - Coffee. Medical History Hematologic/Lymphatic Patient has history of Lymphedema - lower legs Denies history of Anemia, Hemophilia, Human Immunodeficiency Virus, Sickle Cell Disease TAJANA, MUNDLE ([id number]) [id number] Page 4 of 6 Cardiovascular Patient has history of Deep Vein Thrombosis - left leg Denies history of Angina, Arrhythmia, Congestive Heart Failure, Coronary Artery Disease, Hypertension, Hypotension, Myocardial Infarction, Peripheral Arterial Disease, Peripheral Venous Disease, Phlebitis, Vasculitis Gastrointestinal Denies history of Cirrhosis , Colitis, Crohnoos, Hepatitis A, Hepatitis B, Hepatitis C Endocrine Denies history of Type I Diabetes, Type II Diabetes Integumentary (Skin) Denies history of History of Burn Musculoskeletal Patient has history of Osteoarthritis Denies history of Gout, Rheumatoid Arthritis, Osteomyelitis Oncologic Patient has history of Received Chemotherapy, Received Radiation Medical A Surgical History Notes nd Hematologic/Lymphatic Pancytopenia Gastrointestinal GERD Musculoskeletal DJD left knee Oncologic Uterine Cancer Objective Constitutional respirations regular, non-labored and within target range for patient.. Vitals Time Taken: 8:14 AM, Temperature: 98.7 F, Pulse: 74 bpm, Respiratory Rate: 17 breaths/min, Blood Pressure: 120/74 mmHg. Cardiovascular 2+ dorsalis pedis/posterior tibialis pulses. Psychiatric pleasant and cooperative. General Notes: Right lower extremity  with significant lymphedema but no open wounds, drainage or signs of infection including increased warmth, erythema or purulent drainage. No pain reported Assessment Active Problems ICD-10 Lymphedema, not elsewhere classified Patient presents with lymphedema and no open wounds. She has compression stockings but has a difficult time putting them on. I recommended she elevate her legs when sitting. I recommended she follow-up if she were to develop any open wounds. Plan Discharge From [facility] Services: Discharge from [place] Edema Control - Lymphedema / SCD / Other: Elevate legs to the level of the heart or above for 30 minutes daily and/or when sitting for 3-4 times a day throughout the day. Avoid standing for long periods of time. Patient to wear own compression stockings every day. 1. Follow-up as needed 2. Compression stockings and elevate legs daily Leslie, Duncan ([id number]) [id number] Page 5 of 6 Electronic Signature(s) Signed: 05/09/2022 1:29:58 PM By: Kalman Shan DO Entered By: Kalman Shan on 05/09/2022 09:13:54 -------------------------------------------------------------------------------- HxROS Details Patient Name: Date of Service: [name]. 05/09/2022 8:00 A M Medical Record Number: [id number] Patient Account Number: [id number] Date of Birth/Sex: Treating RN: October 25, 1945 (77 y.o. Benjaman Lobe Primary Care Provider: Loura Pardon Other Clinician: Referring Provider: Treating Provider/Extender: Rhodia Albright, Himanshu Weeks in Treatment: 0 Information Obtained From Patient Hematologic/Lymphatic Medical History: Positive for: Lymphedema - lower legs Negative for: Anemia; Hemophilia; Human Immunodeficiency Virus; Sickle Cell Disease Past Medical History Notes: Pancytopenia Cardiovascular Medical History: Positive for: Deep Vein Thrombosis - left leg Negative for: Angina; Arrhythmia; Congestive  Heart Failure; Coronary Artery Disease; Hypertension; Hypotension; Myocardial Infarction; Peripheral Arterial Disease; Peripheral Venous Disease; Phlebitis; Vasculitis Gastrointestinal Medical History: Negative for: Cirrhosis ; Colitis; Crohns; Hepatitis A; Hepatitis B; Hepatitis C Past Medical History Notes: GERD Endocrine Medical History: Negative for: Type I Diabetes; Type II Diabetes Integumentary (Skin) Medical History: Negative for: History of Burn Musculoskeletal Medical History: Positive for: Osteoarthritis Negative for: Gout; Rheumatoid Arthritis; Osteomyelitis Past Medical History Notes: DJD left knee Oncologic Medical History: Positive for: Received Chemotherapy; Received Radiation Past Medical History Notes: Uterine  Cancer Immunizations Pneumococcal Vaccine: Received Pneumococcal Vaccination: Yes Received Pneumococcal Vaccination On or After 60th Birthday: Yes Implantable Devices None Family and Social History Unknown History: Yes; Former smoker - quit over 20 years ago; Marital Status - Single; Alcohol Use: Never; Drug Use: No History; Caffeine Use: Moderate [name], [name] ([id number]) [id number] Page 6 of 6 - Coffee; Financial Concerns: No; Food, Clothing or Shelter Needs: No; Support System Lacking: No; Transportation Concerns: No Electronic Signature(s) Signed: 05/09/2022 1:29:58 PM By: Kalman Shan DO Signed: 05/10/2022 11:12:19 AM By: Rhae Hammock RN Entered By: Rhae Hammock on 05/09/2022 08:02:42 -------------------------------------------------------------------------------- SuperBill Details Patient Name: Date of Service: [name]. 05/09/2022 Medical Record Number: [id number] Patient Account Number: [id number] Date of Birth/Sex: Treating RN: 04-Mar-1945 (77 y.o. Tonita Phoenix, Lauren Primary Care Provider: Loura Pardon Other Clinician: Referring Provider: Treating Provider/Extender: Rhodia Albright, Himanshu Weeks in Treatment: 0 Diagnosis Coding ICD-10 Codes Code Description I89.0 Lymphedema, not elsewhere classified Facility Procedures : 7 CPT4 Code: [id number] Description: [phone] - WOUND CARE VISIT-LEV 2 EST PT Modifier: Quantity: 1 Physician Procedures : CPT4 Code Description Modifier [id number] [place] PHYS LEVEL 3 NEW PT ICD-10 Diagnosis Description I89.0 Lymphedema, not elsewhere classified Quantity: 1 Electronic Signature(s) Signed: 05/09/2022 1:29:58 PM By: Kalman Shan DO Entered By: Kalman Shan on 05/09/2022 09:14:06

## 2022-05-11 NOTE — Progress Notes (Signed)
KARLIE, SAKER ([id number]) [id number] Page 1 of 5 Visit Report for 05/09/2022 Allergy List Details Patient Name: Date of Service: [name], [name]. 05/09/2022 8:00 A M Medical Record Number: [id number] Patient Account Number: [id number] Date of Birth/Sex: Treating RN: Feb 24, 1945 (77 y.o. Tonita Phoenix, Lauren Primary Care Indio Santilli: Loura Pardon Other Clinician: Referring Aydian Dimmick: Treating Preesha Benjamin/Extender: Rhodia Albright, Himanshu Weeks in Treatment: 0 Allergies Active Allergies No Known Drug Allergies Allergy Notes Electronic Signature(s) Signed: 05/10/2022 11:12:19 AM By: Rhae Hammock RN Entered By: Rhae Hammock on 05/09/2022 08:02:14 -------------------------------------------------------------------------------- Clinic Level of Care Assessment Details Patient Name: Date of Service: [name], [name]. 05/09/2022 8:00 A M Medical Record Number: [id number] Patient Account Number: [id number] Date of Birth/Sex: Treating RN: 1945/08/22 (77 y.o. Tonita Phoenix, Lauren Primary Care Garett Tetzloff: Loura Pardon Other Clinician: Referring Richel Millspaugh: Treating Jacobi Nile/Extender: Rhodia Albright, Himanshu Weeks in Treatment: 0 Clinic Level of Care Assessment Items TOOL 4 Quantity Score X- 1 0 Use when only an EandM is performed on FOLLOW-UP visit ASSESSMENTS - Nursing Assessment / Reassessment X- 1 10 Reassessment of Co-morbidities (includes updates in patient status) X- 1 5 Reassessment of Adherence to Treatment Plan ASSESSMENTS - Wound and Skin A ssessment / Reassessment []  - 0 Simple Wound Assessment / Reassessment - one wound []  - 0 Complex Wound Assessment / Reassessment - multiple wounds []  - 0 Dermatologic / Skin Assessment (not related to wound area) ASSESSMENTS - Focused Assessment []  - 0 Circumferential Edema Measurements - multi extremities []  - 0 Nutritional Assessment / Counseling / Intervention []  - 0 Lower  Extremity Assessment (monofilament, tuning fork, pulses) []  - 0 Peripheral Arterial Disease Assessment (using hand held doppler) ASSESSMENTS - Ostomy and/or Continence Assessment and Care []  - 0 Incontinence Assessment and Management []  - 0 Ostomy Care Assessment and Management (repouching, etc.) PROCESS - Coordination of Care X - Simple Patient / Family Education for ongoing care 1 15 []  - 0 Complex (extensive) Patient / Family Education for ongoing care X- 1 10 Staff obtains Programmer, systems, Records, T Results / Process Orders est []  - 0 Staff telephones HHA, Nursing Homes / Clarify orders / etc ERIKO, OBRION ([id number]) [id number] Page 2 of 5 []  - 0 Routine Transfer to another Facility (non-emergent condition) []  - 0 Routine Hospital Admission (non-emergent condition) []  - 0 New Admissions / Biomedical engineer / Ordering NPWT Apligraf, etc. , []  - 0 Emergency Hospital Admission (emergent condition) X- 1 10 Simple Discharge Coordination []  - 0 Complex (extensive) Discharge Coordination PROCESS - Special Needs []  - 0 Pediatric / Minor Patient Management []  - 0 Isolation Patient Management []  - 0 Hearing / Language / Visual special needs []  - 0 Assessment of Community assistance (transportation, D/C planning, etc.) []  - 0 Additional assistance / Altered mentation []  - 0 Support Surface(s) Assessment (bed, cushion, seat, etc.) INTERVENTIONS - Wound Cleansing / Measurement []  - 0 Simple Wound Cleansing - one wound []  - 0 Complex Wound Cleansing - multiple wounds []  - 0 Wound Imaging (photographs - any number of wounds) []  - 0 Wound Tracing (instead of photographs) []  - 0 Simple Wound Measurement - one wound []  - 0 Complex Wound Measurement - multiple wounds INTERVENTIONS - Wound Dressings []  - 0 Small Wound Dressing one or multiple wounds []  - 0 Medium Wound Dressing one or multiple wounds []  - 0 Large Wound Dressing one or  multiple wounds []  - 0 Application of Medications - topical []  - 0 Application of Medications - injection INTERVENTIONS -  Miscellaneous []  - 0 External ear exam []  - 0 Specimen Collection (cultures, biopsies, blood, body fluids, etc.) []  - 0 Specimen(s) / Culture(s) sent or taken to Lab for analysis []  - 0 Patient Transfer (multiple staff / Harrel Lemon Lift / Similar devices) []  - 0 Simple Staple / Suture removal (25 or less) []  - 0 Complex Staple / Suture removal (26 or more) []  - 0 Hypo / Hyperglycemic Management (close monitor of Blood Glucose) []  - 0 Ankle / Brachial Index (ABI) - do not check if billed separately X- 1 5 Vital Signs Has the patient been seen at the hospital within the last three years: Yes Total Score: 55 Level Of Care: New/Established - Level 2 Electronic Signature(s) Signed: 05/10/2022 11:12:19 AM By: Rhae Hammock RN Entered By: Rhae Hammock on 05/09/2022 09:00:21 -------------------------------------------------------------------------------- Encounter Discharge Information Details Patient Name: Date of Service: Leslie Punt M. 05/09/2022 8:00 A [name], Abbey Chatters ([id number] Page 3 of 5 Medical Record Number: [id number] Patient Account Number: [id number] Date of Birth/Sex: Treating RN: 09-11-1945 (77 y.o. Benjaman Lobe Primary Care Tangi Shroff: Loura Pardon Other Clinician: Referring Rilea Arutyunyan: Treating Larell Baney/Extender: Rhodia Albright, Himanshu Weeks in Treatment: 0 Encounter Discharge Information Items Discharge Condition: Stable Ambulatory Status: Walker Discharge Destination: Home Transportation: Private Auto Accompanied By: self Schedule Follow-up Appointment: Yes Clinical Summary of Care: Patient Declined Electronic Signature(s) Signed: 05/10/2022 11:12:19 AM By: Rhae Hammock RN Entered By: Rhae Hammock on 05/09/2022  09:00:57 -------------------------------------------------------------------------------- Lower Extremity Assessment Details Patient Name: Date of Service: Leslie Covey. 05/09/2022 8:00 A M Medical Record Number: [id number] Patient Account Number: [id number] Date of Birth/Sex: Treating RN: Apr 04, 1945 (77 y.o. Benjaman Lobe Primary Care Zabdiel Dripps: Loura Pardon Other Clinician: Referring Artisha Capri: Treating Abiel Antrim/Extender: Rhodia Albright, Himanshu Weeks in Treatment: 0 Notes no wounds Electronic Signature(s) Signed: 05/10/2022 11:12:19 AM By: Rhae Hammock RN Entered By: Rhae Hammock on 05/09/2022 08:16:22 -------------------------------------------------------------------------------- Multi Wound Chart Details Patient Name: Date of Service: Leslie Covey. 05/09/2022 8:00 A M Medical Record Number: [id number] Patient Account Number: [id number] Date of Birth/Sex: Treating RN: Jul 04, 1945 (77 y.o. F) Primary Care Lilliona Blakeney: Loura Pardon Other Clinician: Referring Roe Koffman: Treating Clarice Zulauf/Extender: Rhodia Albright, Himanshu Weeks in Treatment: 0 Vital Signs Height(in): Pulse(bpm): 74 Weight(lbs): Blood Pressure(mmHg): 120/74 Body Mass Index(BMI): Temperature(F): 98.7 Respiratory Rate(breaths/min): 17 [Treatment Notes:Wound Assessments Treatment Notes] Electronic Signature(s) Signed: 05/09/2022 1:29:58 PM By: Kalman Shan DO Entered By: Kalman Shan on 05/09/2022 08:50:31 -------------------------------------------------------------------------------- Multi-Disciplinary Care Plan Details Patient Name: Date of Service: Leslie Punt M. 05/09/2022 8:00 A Marcille Blanco ([id number] Page 4 of 5 Medical Record Number: [id number] Patient Account Number: [id number] Date of Birth/Sex: Treating RN: 1945-03-01 (77 y.o. Benjaman Lobe Primary Care Benny Henrie: Loura Pardon Other  Clinician: Referring Ahlia Lemanski: Treating Marlie Kuennen/Extender: Rhodia Albright, Himanshu Weeks in Treatment: 0 Active Inactive Electronic Signature(s) Signed: 05/10/2022 11:12:19 AM By: Rhae Hammock RN Entered By: Rhae Hammock on 05/09/2022 08:25:37 -------------------------------------------------------------------------------- Pain Assessment Details Patient Name: Date of Service: Leslie Covey. 05/09/2022 8:00 A M Medical Record Number: [id number] Patient Account Number: [id number] Date of Birth/Sex: Treating RN: May 31, 1945 (77 y.o. Tonita Phoenix, Lauren Primary Care Shakevia Sarris: Loura Pardon Other Clinician: Referring Aidaly Cordner: Treating Bobby Barton/Extender: Rhodia Albright, Himanshu Weeks in Treatment: 0 Active Problems Location of Pain Severity and Description of Pain Patient Has Paino No Site Locations Pain Management and Medication Current Pain Management: Electronic Signature(s) Signed: 05/10/2022 11:12:19 AM By: Rhae Hammock RN Entered By: Rhae Hammock on 05/09/2022 08:16:35 --------------------------------------------------------------------------------  Patient/Caregiver Education Details Patient Name: Date of Service: [name], Leslie YCE M. 3/21/2024andnbsp8:00 A M Medical Record Number: [id number] Patient Account Number: [id number] Date of Birth/Gender: Treating RN: [birth year] (77 y.o. Benjaman Lobe Primary Care Physician: Loura Pardon Other Clinician: Referring Physician: Treating Physician/Extender: Osborne Oman Weeks in Treatment: 0 Education Assessment Education Provided ToALVETA, SWADER ([id number]) [id number] Page 5 of 5 Patient Education Topics Provided Wound/Skin Impairment: Methods: Explain/Verbal Responses: State content correctly Electronic Signature(s) Signed: 05/10/2022 11:12:19 AM By: Rhae Hammock RN Entered By: Rhae Hammock on 05/09/2022  08:25:44 -------------------------------------------------------------------------------- Vitals Details Patient Name: Date of Service: Leslie Covey. 05/09/2022 8:00 A M Medical Record Number: [id number] Patient Account Number: [id number] Date of Birth/Sex: Treating RN: 04-02-1945 (77 y.o. Tonita Phoenix, Lauren Primary Care Nahzir Pohle: Loura Pardon Other Clinician: Referring Durwood Dittus: Treating Armilda Vanderlinden/Extender: Rhodia Albright, Himanshu Weeks in Treatment: 0 Vital Signs Time Taken: 08:14 Temperature (F): 98.7 Pulse (bpm): 74 Respiratory Rate (breaths/min): 17 Blood Pressure (mmHg): 120/74 Reference Range: 80 - 120 mg / dl Electronic Signature(s) Signed: 05/10/2022 11:12:19 AM By: Rhae Hammock RN Entered By: Rhae Hammock on 05/09/2022 08:15:26

## 2022-05-14 ENCOUNTER — Inpatient Hospital Stay: Payer: 59 | Admitting: Hematology and Oncology

## 2022-05-21 ENCOUNTER — Other Ambulatory Visit: Payer: Self-pay

## 2022-05-21 ENCOUNTER — Inpatient Hospital Stay: Payer: 59 | Attending: Hematology and Oncology | Admitting: Hematology and Oncology

## 2022-05-21 ENCOUNTER — Encounter: Payer: Self-pay | Admitting: Hematology and Oncology

## 2022-05-21 VITALS — BP 143/61 | HR 93 | Temp 97.9°F | Resp 18 | Ht 60.0 in | Wt 256.8 lb

## 2022-05-21 DIAGNOSIS — C55 Malignant neoplasm of uterus, part unspecified: Secondary | ICD-10-CM | POA: Diagnosis not present

## 2022-05-21 DIAGNOSIS — Z79899 Other long term (current) drug therapy: Secondary | ICD-10-CM | POA: Diagnosis not present

## 2022-05-21 DIAGNOSIS — K449 Diaphragmatic hernia without obstruction or gangrene: Secondary | ICD-10-CM | POA: Insufficient documentation

## 2022-05-21 DIAGNOSIS — G893 Neoplasm related pain (acute) (chronic): Secondary | ICD-10-CM | POA: Diagnosis not present

## 2022-05-21 DIAGNOSIS — K5730 Diverticulosis of large intestine without perforation or abscess without bleeding: Secondary | ICD-10-CM | POA: Insufficient documentation

## 2022-05-21 DIAGNOSIS — I700 Atherosclerosis of aorta: Secondary | ICD-10-CM | POA: Diagnosis not present

## 2022-05-21 DIAGNOSIS — Z86718 Personal history of other venous thrombosis and embolism: Secondary | ICD-10-CM | POA: Diagnosis not present

## 2022-05-21 DIAGNOSIS — Z8542 Personal history of malignant neoplasm of other parts of uterus: Secondary | ICD-10-CM | POA: Insufficient documentation

## 2022-05-21 DIAGNOSIS — Z7901 Long term (current) use of anticoagulants: Secondary | ICD-10-CM | POA: Insufficient documentation

## 2022-05-21 NOTE — Assessment & Plan Note (Signed)
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

## 2022-05-21 NOTE — Progress Notes (Signed)
[name] OFFICE PROGRESS NOTE  Patient Care Team: Leslie Pardon, MD as PCP - General (Family Medicine)  ASSESSMENT & PLAN:  Uterine cancer ([place]) I have reviewed imaging studies with the patient She has no signs of cancer recurrence I plan to see her again in 6 months for further follow-up and repeat CT imaging We discussed port maintenance  Cancer associated pain Her cancer associated pain is due to left hip fracture and related to her previous treatment She will continue her prescribed pain medicine  Orders Placed This Encounter  Procedures   Comprehensive metabolic panel    Standing Status:   Standing    Number of Occurrences:   33    Standing Expiration Date:   05/21/2023    All questions were answered. The patient knows to call the clinic with any problems, questions or concerns. The total time spent in the appointment was 25 minutes encounter with patients including review of chart and various tests results, discussions about plan of care and coordination of care plan   Leslie [name], MD 05/21/2022 3:41 PM  INTERVAL HISTORY: Please see below for problem oriented charting. she returns for surveillance follow-up for history of treated recurrent uterine cancer She is doing well Her chronic pain is stable I reviewed imaging study with her and we discussed future follow-up  REVIEW OF SYSTEMS:   Constitutional: Denies fevers, chills or abnormal weight loss Eyes: Denies blurriness of vision Ears, nose, mouth, throat, and face: Denies mucositis or sore throat Respiratory: Denies cough, dyspnea or wheezes Cardiovascular: Denies palpitation, chest discomfort or lower extremity swelling Gastrointestinal:  Denies nausea, heartburn or change in bowel habits Skin: Denies abnormal skin rashes Lymphatics: Denies new lymphadenopathy or easy bruising Neurological:Denies numbness, tingling or new weaknesses Behavioral/Psych: Mood is stable, no new changes  All other  systems were reviewed with the patient and are negative.  I have reviewed the past medical history, past surgical history, social history and family history with the patient and they are unchanged from previous note.  ALLERGIES:  has No Known Allergies.  MEDICATIONS:  Current Outpatient Medications  Medication Sig Dispense Refill   apixaban (ELIQUIS) 2.5 MG TABS tablet Take by mouth 2 (two) times daily.     diclofenac sodium (VOLTAREN) 1 % GEL APPLY 4GRAMS 4 TIMES A DAY AS NEEDED FOR PAINS     furosemide (LASIX) 40 MG tablet Take 40 mg by mouth daily as needed.     gabapentin (NEURONTIN) 300 MG capsule Take 300 mg by mouth 3 (three) times daily.     lidocaine-prilocaine (EMLA) cream Apply 1 application topically daily as needed. 30 g 3   methadone (DOLOPHINE) 10 MG tablet Take 1 tablet (10 mg total) by mouth every 12 (twelve) hours. 60 tablet 0   morphine (MSIR) 15 MG tablet Take 1 tablet (15 mg total) by mouth every 6 (six) hours as needed for severe pain. 60 tablet 0   Olopatadine HCl 0.2 % SOLN Place 1 drop into both eyes daily.     potassium chloride (KLOR-CON) 10 MEQ tablet Take 10 mEq by mouth daily.     No current facility-administered medications for this visit.    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 Uterus +/- tubes/ovaries, neoplastic, with left fallopian tube and ovary - INVASIVE ENDOMETRIOID  CARCINOMA (1.5 CM), FIGO GRADE II, ARISING IN A BACKGROUND OF ATYPICAL COMPLEX HYPERPLASIA, CONFINED WITHIN INNER HALF OF THE MYOMETRIUM. - ENDOMETRIAL POLYP WITH ASSOCIATED ATYPICAL COMPLEX HYPERPLASIA. - MYOMETRIUM: LEIOMYOMATA. - CERVIX: BENIGN SQUAMOUS MUCOSA AND ENDOCERVICAL MUCOSA, NO DYSPLASIA OR MALIGNANCY. - LEFT OVARY: BENIGN OVARIAN TISSUE WITH ENDOSALPINGOSIS, NO  EVIDENCE OF ATYPIA OR MALIGNANCY. - LEFT FALLOPIAN TUBE: NO HISTOLOGIC ABNORMALITIES. - PLEASE SEE ONCOLOGY TEMPLATE FOR DETAIL. 2. Ovary and fallopian tube, right - BENIGN OVARIAN TISSUE WITH ENDOSALPINGOSIS, NO ATYPIA OR MALIGNANCY. - BENIGN FALLOPIAN TUBAL TISSUE, NO PATHOLOGIC ABNORMALITIES. Microscopic Comment 1. UTERUS Specimen: Uterus, cervix, bilateral ovaries and fallopian tubes Procedure: Total hysterectomy and bilateral salpingo-oophorectomy Lymph node sampling performed: No Specimen integrity: Intact Maximum tumor size (cm): 1.5 cm, glass slide measurement Histologic type: Invasive endometrioid carcinoma Grade: FIGO grade II Myometrial invasion: 1 cm where myometrium is 2.3 cm in thickness Cervical stromal involvement: No Extent of involvement of other organs: No Lymph vascular invasion: Not identified Peritoneal washings: Negative [id number] Lymph nodes: number examined N/A; number positive N/A TNM code: pT1a, pNX 1 oFf 3IGO Stage (based on pathologic findings, needs clinical correlation): IA  Comments: Sections the endomyometrium away from the grossly identified endometrial polyp show an invasive FIGO grade II endometrioid carcinoma. The tumor is confined within inner half of the myometrium. No angiolymphatic invasion is identified. No cervical stromal involvement is identified. Sections of the grossly identified endometrial polyp show an endometrial polyp with associated atypical compacted hyperplasia with no definitive evidence of carcinoma.   12/07/2017 Imaging   US venous Doppler Right: No evidence of common femoral vein obstruction. Left: Findings consistent with acute deep vein thrombosis involving the left femoral vein, left proximal profunda vein, and left popliteal vein. Unable to adequately interrogate the common femoral and higher, or the calf secondary to significant edema and body habitus   12/07/2017 [facility] Admission   She presented to the ER and  was diagnosed with acute DVT   01/18/2018 - 01/21/2018 Hospital Admission   She was admitted to the hospital for management of severe persistent DVT   01/18/2018 Imaging   US venous Doppler Right: No evidence of common femoral vein obstruction. Left: Findings consistent with acute deep vein thrombosis involving the left common femoral vein, and left popliteal vein.   01/19/2018 Surgery   Pre-operative Diagnosis: Subacute DVT with severe post thrombotic syndrome Post-operative diagnosis:  Same Surgeon:  Erlene Quan C. Donzetta Matters, MD Procedure Performed: 1.  Ultrasound-guided cannulation left small saphenous vein 2.  Left lower extremity and central venography 3.  Intravascular ultrasound of left popliteal, femoral, common femoral, external and common iliac veins and IVC 4.  Stent of left common and external iliac veins with 14 x 60 mm Vici 5.  Moderate sedation with fentanyl and Versed for 50 minutes   Indications: 77 year old female with a history of DVT in October now presents with persistent left lower extremity swelling and ultrasound demonstrating likely persistent DVT.  She has been on Xarelto at this time.  She is now indicated for venogram possible intervention.   Findings: Flow in the left lower extremity was stagnant throughout but by venogram all veins were patent.  There was a focal occlusive area approximately 2 cm in length at the common and external iliac vein junction at the hypogastric on the left.  After stenting and ballooning we had a diameter of 12 millimeters in the stent and venogram demonstrated flow in the lower extremity veins were previously was stagnant and no further  residual stenosis in the left common and external iliac vein junction.   04/12/2018 Imaging   US Venous Doppler Right: No evidence of common femoral vein obstruction. Left: There is no evidence of deep vein thrombosis in the lower extremity. However, portions of this examination were limited- see technologist  comments above. Left groin: Large hypoechoic area with mixed echoes noted measuring nearly 10 cm. Possible  hematoma versus unknown etiology. Ultrasound characteristics of enlarged lymph nodes noted in the groin.      05/15/2018 Imaging   US Venous Doppler Right: No evidence of deep vein thrombosis in the lower extremity. No indirect evidence of obstruction proximal to the inguinal ligament. Left: No reflux was noted in the common femoral vein , femoral vein in the thigh, popliteal vein, great saphenous vein at the saphenofemoral junction, great saphenous vein at the proximal thigh, great saphenous vein at the mid thigh, great saphenous vein  at the distal thigh, great saphenous vein at the knee, origin of the small saphenous vein, proximal small saphenous vein, and mid small saphenous vein. There is no evidence of deep vein thrombosis in the lower extremity. There is no evidence of superficial venous thrombosis. No cystic structure found in the popliteal fossa. Unable to evaluate extension of common femoral vein obstruction proximal to the inguinal ligament.   06/01/2018 Imaging   1. 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 Additionally, there is lucency involving the medial sidewall of the left acetabulum with potential nondisplaced pathologic fracture. Further evaluation with contrast-enhanced pelvic MRI could be performed as clinically indicated. 2. The left pelvic arterial and venous system is encased by this infiltrative left pelvic sidewall mass however while difficult to ascertain, the left external iliac venous stent appears patent.   06/18/2018 Pathology Results   Lymph node for lymphoma, Left Inguinal - METASTATIC ADENOCARCINOMA, SEE COMMENT. Microscopic Comment Immunohistochemistry is positive for cytokeratin 7, PAX8, ER, and PR. Cytokeratin 5/6,and p63 are negative. The immunoprofile  along with the patient's history are consistent with a gynecologic primary.   06/18/2018 Surgery   Pre-op Diagnosis: INGUINAL LYMPHADENOPATHY, PELVIC MASS      Procedure(s): EXCISIONAL BIOPSY DEEP LEFT INGUINAL LYMPH NODE   Surgeon(s): Coralie Keens, MD      06/24/2018 Cancer Staging   Staging form: Corpus Uteri - Carcinoma and Carcinosarcoma, AJCC 8th Edition - Clinical: Stage IVB (cT1a, cN2, pM1) - Signed by Leslie [name], MD on 06/24/2018    Genetic Testing   Patient has genetic testing done for MMR on pathology from 06/18/2018. Results revealed patient has the following mutation(s): MMR: abnormal   06/29/2018 Procedure   Placement of a subcutaneous port device. Catheter tip at the SVC and right atrium junction.    Genetic Testing   Patient has genetic testing done for MSI on pathology from 06/18/2018. Results revealed patient has the following mutation(s): MSI: High   07/02/2018 PET scan   Previous hysterectomy, with asymmetric focus of hypermetabolic activity in the left vaginal cuff, suspicious for residual or recurrent carcinoma.   Large hypermetabolic soft tissue mass involving the left pelvic sidewall and acetabulum, consistent with metastatic disease.   No evidence metastatic disease within the abdomen, chest, or neck.   07/09/2018 Tumor Marker   Patient's tumor was tested for the following markers: CA-125 Results of the tumor marker test revealed 9   07/10/2018 - 08/24/2018 Chemotherapy   The patient had carboplatin and taxol x 3 cycles  07/17/2018 Genetic Testing   Negative genetic testing on the common hereditary cancer panel.  The Common Hereditary Gene Panel offered by [facility] includes sequencing and/or deletion duplication testing of the following 48 genes: APC, ATM, AXIN2, BARD1, BMPR1A, BRCA1, BRCA2, BRIP1, CDH1, CDK4, CDKN2A (p14ARF), CDKN2A (p16INK4a), CHEK2, CTNNA1, DICER1, EPCAM (Deletion/duplication testing only), GREM1 (promoter region deletion/duplication  testing only), KIT, MEN1, MLH1, MSH2, MSH3, MSH6, MUTYH, NBN, NF1, NHTL1, PALB2, PDGFRA, PMS2, POLD1, POLE, PTEN, RAD50, RAD51C, RAD51D, RNF43, SDHB, SDHC, SDHD, SMAD4, SMARCA4. STK11, TP53, TSC1, TSC2, and VHL.  The following genes were evaluated for sequence changes only: SDHA and HOXB13 c.251G>A variant only. The report date is Jul 17, 2018.    10/03/2018 Imaging   CT abdomen and pelvis 1.  No acute intra-abdominal process. 2. Grossly unchanged left pelvic sidewall mass with osseous involvement of the medial acetabulum. Progressive mild displacement of the associated comminuted pathologic fracture involving the right acetabulum and puboacetabular junction.  3. New venous stents extending from the left common iliac vein origin to the proximal left common femoral vein. The stents are patent.   11/06/2018 -  Chemotherapy   The patient had pembrolizumab for chemotherapy treatment.     01/28/2019 Imaging   1. No substantial interval change in exam. 2. Interval development of mild fullness in the left intrarenal collecting system and ureter without overt hydronephrosis at this time. 3. Abnormal soft tissue along the left pelvic sidewall has decreased slightly in the interval. 4. Similar appearance of ill-defined fascial planes in the pelvis with some peritoneal thickening along the right pelvic sidewall and potentially involving the sigmoid mesocolon. 5. No substantial ascites.   05/03/2019 Imaging   1. Stable mild left pelvic sidewall soft tissue density. No new or progressive disease identified within the abdomen or pelvis.  2. Colonic diverticulosis. No radiographic evidence of diverticulitis.   Aortic Atherosclerosis (ICD10-I70.0).   09/09/2019 Imaging   1. No change in appearance of soft tissue thickening along the LEFT pelvic sidewall adjacent to chronic LEFT acetabular fracture. 2. Mild asymmetry of the bladder wall favoring the LEFT bladder wall, not well assessed. Similar accounting  for variable degrees of distension on prior studies potentially related to prior radiation, attention on follow-up. 3. Signs of venous stenting in the LEFT hemipelvis with LEFT lower extremity muscular atrophy and mild stranding with similar appearance. Signs of colonic diverticulosis and diverticular disease without change.   02/24/2020 Imaging   1. Unchanged appearance of the pelvis as detailed below. 2. Unchanged soft tissue thickening of the left pelvic sidewall. 3. 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 4. No evidence discrete mass or lymphadenopathy nor metastatic disease in the abdomen or pelvis. 5. Status post hysterectomy and cholecystectomy. 6. Left common iliac vein stent. 7. Pancolonic diverticulosis.     09/07/2020 Imaging   Stable abnormal soft tissue density in the left pelvic sidewall. No new or progressive disease within the abdomen or pelvis.   Colonic diverticulosis. No radiographic evidence of diverticulitis.   Stable severe destructive left hip arthropathy with fracture involving the medial acetabular wall.     03/01/2021 Imaging   Stable abnormal soft tissue density in the left pelvic sidewall. 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   No new or progressive disease within the abdomen or pelvis.   Colonic diverticulosis, without radiographic evidence of diverticulitis.   Tiny hiatal hernia.   11/08/2021 Imaging  1. 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 2. Cannot exclude wall thickening in the lower rectum, correlate with digital rectal exam. 3. Other imaging findings of potential clinical significance: Small type 1 hiatal hernia. Colonic  diverticulosis. Left common iliac vein and external iliac vein stent graft. 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 Small umbilical hernia contains adipose tissue.   05/13/2022 Imaging   Old/treated left acetabular metastasis with chronic adjacent pelvic sidewall soft tissue thickening. No evidence of recurrent or progressive metastatic disease.   Metastasis to lymph nodes  06/23/2018 Initial Diagnosis   Metastasis to lymph nodes ([place])   07/10/2018 - 08/24/2018 Chemotherapy   The patient had palonosetron (ALOXI) injection 0.25 mg, 0.25 mg, Intravenous,  Once, 3 of 6 cycles Administration: 0.25 mg (07/10/2018), 0.25 mg (07/31/2018), 0.25 mg (08/24/2018) CARBOplatin (PARAPLATIN) 480 mg in sodium chloride 0.9 % 250 mL chemo infusion, 480 mg (100 % of original dose 482.5 mg), Intravenous,  Once, 3 of 6 cycles Dose modification: 482.5 mg (original dose 482.5 mg, Cycle 1) Administration: 480 mg (07/10/2018), 480 mg (07/31/2018), 480 mg (08/24/2018) PACLitaxel (TAXOL) 276 mg in sodium chloride 0.9 % 250 mL chemo infusion (> 80mg /m2), 140 mg/m2 = 276 mg (80 % of original dose 175 mg/m2), Intravenous,  Once, 3 of 6 cycles Dose modification: 140 mg/m2 (80 % of original dose 175 mg/m2, Cycle 1, Reason: Dose Not Tolerated) Administration: 276 mg (07/10/2018), 276 mg (07/31/2018), 276 mg (08/24/2018) fosaprepitant (EMEND) 150 mg, dexamethasone (DECADRON) 12 mg in sodium chloride 0.9 % 145 mL IVPB, , Intravenous,  Once, 3 of 6 cycles Administration:  (07/10/2018),  (07/31/2018),  (08/24/2018)  for chemotherapy treatment.    11/06/2018 -  Chemotherapy   The patient had pembrolizumab for chemotherapy treatment.     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  06/24/2018 Initial Diagnosis   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 ([place])   07/10/2018 - 08/24/2018 Chemotherapy   The patient had palonosetron (ALOXI) injection 0.25 mg, 0.25 mg, Intravenous,  Once, 3 of  6 cycles Administration: 0.25 mg (07/10/2018), 0.25 mg (07/31/2018), 0.25 mg (08/24/2018) CARBOplatin (PARAPLATIN) 480 mg in sodium chloride 0.9 % 250 mL chemo infusion, 480 mg (100 % of original dose 482.5 mg), Intravenous,  Once, 3 of 6 cycles Dose modification: 482.5 mg (original dose 482.5 mg, Cycle 1) Administration: 480 mg (07/10/2018), 480 mg (07/31/2018), 480 mg (08/24/2018) PACLitaxel (TAXOL) 276 mg in sodium chloride 0.9 % 250 mL chemo infusion (> 80mg /m2), 140 mg/m2 = 276 mg (80 % of original dose 175 mg/m2), Intravenous,  Once, 3 of 6 cycles Dose modification: 140 mg/m2 (80 % of original dose 175 mg/m2, Cycle 1, Reason: Dose Not Tolerated) Administration: 276 mg (07/10/2018), 276 mg (07/31/2018), 276 mg (08/24/2018) fosaprepitant (EMEND) 150 mg, dexamethasone (DECADRON) 12 mg in sodium chloride 0.9 % 145 mL IVPB, , Intravenous,  Once, 3 of 6 cycles Administration:  (07/10/2018),  (07/31/2018),  (08/24/2018)  for chemotherapy treatment.    11/06/2018 -  Chemotherapy   The patient had pembrolizumab for chemotherapy treatment.     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       PHYSICAL EXAMINATION: ECOG  PERFORMANCE STATUS: 1 - Symptomatic but completely ambulatory  Vitals:   05/21/22 1044  BP: (!) 143/61  Pulse: 93  Resp: 18  Temp: 97.9 F (36.6 C)  SpO2: 99%   Filed Weights   05/21/22 1044  Weight: 256 lb 12.8 oz (116.5 kg)    GENERAL:alert, no distress and comfortable  NEURO: alert & oriented x 3 with fluent speech, no focal motor/sensory deficits  LABORATORY DATA:  I have reviewed the data as listed    Component Value Date/Time   NA 140 05/10/2022 0947   K 4.0 05/10/2022 0947   CL 107 05/10/2022 0947   CO2 27 05/10/2022 0947    GLUCOSE 100 (H) 05/10/2022 0947   BUN 12 05/10/2022 0947   CREATININE 0.71 05/10/2022 0947   CREATININE 0.57 11/30/2020 1115   CALCIUM 9.2 05/10/2022 0947   PROT 7.4 05/10/2022 0947   ALBUMIN 4.1 05/10/2022 0947   AST 11 (L) 05/10/2022 0947   AST 13 (L) 11/30/2020 1115   ALT 9 05/10/2022 0947   ALT 9 11/30/2020 1115   ALKPHOS 91 05/10/2022 0947   BILITOT 0.4 05/10/2022 0947   BILITOT 0.8 11/30/2020 1115   GFRNONAA >60 05/10/2022 0947   GFRNONAA >60 11/30/2020 1115   GFRAA >60 11/12/2019 1222    No results found for: "SPEP", "UPEP"  Lab Results  Component Value Date   WBC 3.5 (L) 05/10/2022   NEUTROABS 2.0 05/10/2022   HGB 11.4 (L) 05/10/2022   HCT 34.1 (L) 05/10/2022   MCV 87.0 05/10/2022   PLT 214 05/10/2022      Chemistry      Component Value Date/Time   NA 140 05/10/2022 0947   K 4.0 05/10/2022 0947   CL 107 05/10/2022 0947   CO2 27 05/10/2022 0947   BUN 12 05/10/2022 0947   CREATININE 0.71 05/10/2022 0947   CREATININE 0.57 11/30/2020 1115      Component Value Date/Time   CALCIUM 9.2 05/10/2022 0947   ALKPHOS 91 05/10/2022 0947   AST 11 (L) 05/10/2022 0947   AST 13 (L) 11/30/2020 1115   ALT 9 05/10/2022 0947   ALT 9 11/30/2020 1115   BILITOT 0.4 05/10/2022 0947   BILITOT 0.8 11/30/2020 1115       RADIOGRAPHIC STUDIES: I have personally reviewed the radiological images as listed and agreed with the findings in the report. CT ABDOMEN PELVIS W CONTRAST  Result Date: 05/13/2022 CLINICAL DATA:  Cervical cancer metastatic to bone and lymph nodes. Therapy complete. * Tracking Code: BO * EXAM: CT ABDOMEN AND PELVIS WITH CONTRAST TECHNIQUE: Multidetector CT imaging of the abdomen and pelvis was performed using the standard protocol following bolus administration of intravenous contrast. RADIATION DOSE REDUCTION: This exam was performed according to the departmental dose-optimization program which includes automated exposure control, adjustment of the mA and/or kV  according to patient size and/or use of iterative reconstruction technique. CONTRAST:  142mL OMNIPAQUE IOHEXOL 300 MG/ML  SOLN COMPARISON:  11/06/2021 and PET 07/02/2018. FINDINGS: Lower chest: A few scattered pulmonary nodules measure 4 mm or less in size, unchanged from 07/02/2018. No specific follow-up necessary. No pleural fluid. Right-sided central venous catheter terminates at the SVC RA junction, incompletely imaged. Heart is at the upper limits of normal in size to mildly enlarged. No pericardial or pleural effusion. Distal esophagus is grossly unremarkable. No specific follow-up necessary. Hepatobiliary: Liver is unremarkable. Cholecystectomy. No biliary ductal dilatation. Pancreas: Negative. Spleen: Negative. Adrenals/Urinary Tract: Adrenal glands and kidneys are unremarkable. Ureters are decompressed. Bladder  is grossly unremarkable although there may be a small cystocele. Stomach/Bowel: Small hiatal hernia. Stomach, small bowel, appendix and colon are unremarkable. Vascular/Lymphatic: Left iliac vein stent. No pathologically enlarged lymph nodes. Left inguinal lymph nodes measure up to 13 mm, unchanged. Surgical clips in the left groin. Reproductive: Hysterectomy.  No adnexal mass. Other: No free fluid. Minimal haziness and nodularity in the small bowel mesentery, unchanged. Musculoskeletal: Degenerative changes in the spine. Extensive sclerosis involving the left femoral head and left acetabulum with an old medial acetabular fracture. Associated acetabular protrusio and soft tissue thickening along the left external iliac vasculature. 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 No evidence of recurrent or progressive metastatic disease. Electronically Signed   By: Lorin Picket M.D.   On: 05/13/2022 13:27

## 2022-05-21 NOTE — Assessment & Plan Note (Signed)
Her cancer associated pain is due to left hip fracture and related to her previous treatment °She will continue her prescribed pain medicine °

## 2022-05-29 ENCOUNTER — Other Ambulatory Visit: Payer: Self-pay

## 2022-05-29 ENCOUNTER — Telehealth: Payer: Self-pay

## 2022-05-29 ENCOUNTER — Telehealth: Payer: Self-pay | Admitting: *Deleted

## 2022-05-29 MED ORDER — SULFAMETHOXAZOLE-TRIMETHOPRIM 800-160 MG PO TABS: 1.0000 | ORAL_TABLET | Freq: Two times a day (BID) | ORAL | 0 refills | Status: AC

## 2022-05-29 NOTE — Telephone Encounter (Signed)
Received vm message from pt stating that she is having painful urination. Please return her call.

## 2022-05-29 NOTE — Telephone Encounter (Signed)
Tell her in general I prefer urine culture I am ok to do it this time only, in the future she needs to come in Call in Bactrim DS 1 tab BID for 3 days to her pharmacy If not better she needs urine culture

## 2022-05-29 NOTE — Progress Notes (Signed)
Called Pt regarding dysuria. Per MD, OK to order Bactrim DS 1 tab BID for 3 days. MD reports this is the final time she will order antibx without a UC. MD states that if Pt is not better within 3 days, she must come in to be seen. Pt verbalized understanding. Rx ordered to [facility] on [address] per Pt.

## 2022-05-29 NOTE — Telephone Encounter (Signed)
Returned Pt's call regarding dysuria. Pt states she has had pain with urination for 3 days. Pt denies fever or hematuria. Pt declined offer for [facility] appt today stating "I just want Dr. Bertis Ruddy to order an antibiotic like last time" as transportation is a barrier. Message relayed to MD.

## 2022-07-04 ENCOUNTER — Encounter: Payer: Self-pay | Admitting: Psychiatry

## 2022-07-05 ENCOUNTER — Inpatient Hospital Stay: Payer: 59 | Attending: Hematology and Oncology

## 2022-07-05 DIAGNOSIS — Z08 Encounter for follow-up examination after completed treatment for malignant neoplasm: Secondary | ICD-10-CM | POA: Diagnosis not present

## 2022-07-05 DIAGNOSIS — Z8542 Personal history of malignant neoplasm of other parts of uterus: Secondary | ICD-10-CM | POA: Diagnosis present

## 2022-07-05 DIAGNOSIS — Z452 Encounter for adjustment and management of vascular access device: Secondary | ICD-10-CM | POA: Diagnosis not present

## 2022-07-05 DIAGNOSIS — Z923 Personal history of irradiation: Secondary | ICD-10-CM | POA: Insufficient documentation

## 2022-07-05 DIAGNOSIS — Z79899 Other long term (current) drug therapy: Secondary | ICD-10-CM | POA: Diagnosis not present

## 2022-07-05 DIAGNOSIS — R600 Localized edema: Secondary | ICD-10-CM | POA: Diagnosis not present

## 2022-07-05 DIAGNOSIS — C55 Malignant neoplasm of uterus, part unspecified: Secondary | ICD-10-CM

## 2022-07-05 DIAGNOSIS — Z9221 Personal history of antineoplastic chemotherapy: Secondary | ICD-10-CM | POA: Insufficient documentation

## 2022-07-05 MED ORDER — SODIUM CHLORIDE 0.9% FLUSH
10.0000 mL | Freq: Once | INTRAVENOUS | Status: AC
  Administered 2022-07-05: 10 mL

## 2022-07-05 MED ORDER — HEPARIN SOD (PORK) LOCK FLUSH 100 UNIT/ML IV SOLN
250.0000 [IU] | Freq: Once | INTRAVENOUS | Status: AC
  Administered 2022-07-05: 250 [IU]

## 2022-07-08 ENCOUNTER — Inpatient Hospital Stay (HOSPITAL_BASED_OUTPATIENT_CLINIC_OR_DEPARTMENT_OTHER): Payer: 59 | Admitting: Psychiatry

## 2022-07-08 VITALS — BP 140/78 | HR 83 | Temp 97.9°F | Resp 16 | Ht 62.99 in | Wt 251.0 lb

## 2022-07-08 DIAGNOSIS — C541 Malignant neoplasm of endometrium: Secondary | ICD-10-CM

## 2022-07-08 DIAGNOSIS — Z8542 Personal history of malignant neoplasm of other parts of uterus: Secondary | ICD-10-CM

## 2022-07-08 DIAGNOSIS — Z08 Encounter for follow-up examination after completed treatment for malignant neoplasm: Secondary | ICD-10-CM | POA: Diagnosis not present

## 2022-07-08 DIAGNOSIS — R600 Localized edema: Secondary | ICD-10-CM | POA: Diagnosis not present

## 2022-07-08 NOTE — Patient Instructions (Addendum)
It was a pleasure to see you in clinic today. - Exam normal today. - Return visit planned for 6 months - I have placed a referral to the lymphedema clinic. You should receive a call.  Thank you very much for allowing me to provide care for you today.  I appreciate your confidence in choosing our Gynecologic Oncology team at [facility].  If you have any questions about your visit today please call our office or send [place] a MyChart message and we will get back to you as soon as possible.

## 2022-07-08 NOTE — Progress Notes (Signed)
Gynecologic Oncology Return Clinic Visit  Date of Service: 07/08/2022 Referring Provider: Artis Delay, MD  Assessment & Plan: [name] is a 77 y.o. 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  Now currently off of treatment.  She presents for endometrial cancer surveillance.  Recurrent endometrial cancer: - Clinically NED on exam today. - Signs and symptoms of recurrence reviewed with patient. - Discussed surveillance every 3 months initially. - CT A/P 3/22 w/o evidence or recurrent to metastatic disease. Reviewed with pt.  - Dr. Bertis Ruddy planning follow-up and repeat scan 66months from prior (around September/October 2024).   Lower extremity edema and skin changes: - Last seen by wound clinic in March. - Referral to lymphedema clinic placed. - Improvement in groin, pannus skin changes otherwise.  RTC 66mo (alt q70mo with Dr. Bertis Ruddy).  Clide Cliff, MD Gynecologic Oncology   Medical Decision Making I personally spent  TOTAL 25 minutes face-to-face and non-face-to-face in the care of this patient, which includes all pre, intra, and post visit time on the date of service.   ----------------------- Reason for Visit: Surveillance  Treatment History: Oncology History Overview Note  Hx of endometrioid cancer in 2012 (FIGO grade II, [id number]), recurrent disease in 2020 MMR: abnormal MSI: High Genetics are negative   Uterine cancer (HCC)  07/03/2010 Pathology Results   1. Uterus +/- tubes/ovaries, neoplastic, with left fallopian tube and ovary - INVASIVE ENDOMETRIOID CARCINOMA (1.5 CM), FIGO GRADE II, ARISING IN A BACKGROUND OF ATYPICAL COMPLEX HYPERPLASIA, CONFINED WITHIN INNER HALF OF THE MYOMETRIUM. - ENDOMETRIAL POLYP WITH ASSOCIATED ATYPICAL COMPLEX HYPERPLASIA. - MYOMETRIUM: LEIOMYOMATA. - CERVIX: BENIGN SQUAMOUS MUCOSA  AND ENDOCERVICAL MUCOSA, NO DYSPLASIA OR MALIGNANCY. - LEFT OVARY: BENIGN OVARIAN TISSUE WITH ENDOSALPINGOSIS, NO EVIDENCE OF ATYPIA OR MALIGNANCY. - LEFT FALLOPIAN TUBE: NO HISTOLOGIC ABNORMALITIES. - PLEASE SEE ONCOLOGY TEMPLATE FOR DETAIL. 2. Ovary and fallopian tube, right - BENIGN OVARIAN TISSUE WITH ENDOSALPINGOSIS, NO ATYPIA OR MALIGNANCY. - BENIGN FALLOPIAN TUBAL TISSUE, NO PATHOLOGIC ABNORMALITIES. Microscopic Comment 1. UTERUS Specimen: Uterus, cervix, bilateral ovaries and fallopian tubes Procedure: Total hysterectomy and bilateral salpingo-oophorectomy Lymph node sampling performed: No Specimen integrity: Intact Maximum tumor size (cm): 1.5 cm, glass slide measurement Histologic type: Invasive endometrioid carcinoma Grade: FIGO grade II Myometrial invasion: 1 cm where myometrium is 2.3 cm in thickness Cervical stromal involvement: No Extent of involvement of other organs: No Lymph vascular invasion: Not identified Peritoneal washings: Negative ([id number]) Lymph nodes: number examined N/A; number positive N/A TNM code: pT1a, pNX 1 oFf 3IGO Stage (based on pathologic findings, needs clinical correlation): IA  Comments: Sections the endomyometrium away from the grossly identified endometrial polyp show an invasive FIGO grade II endometrioid carcinoma. The tumor is confined within inner half of the myometrium. No angiolymphatic invasion is identified. No cervical stromal involvement is identified. Sections of the grossly identified endometrial polyp show an endometrial polyp with associated atypical compacted hyperplasia with no definitive evidence of carcinoma.   12/07/2017 Imaging   US venous Doppler Right: No evidence of common femoral vein obstruction. Left: Findings consistent with acute deep vein thrombosis involving the left femoral vein, left proximal profunda vein, and left popliteal vein. Unable to adequately interrogate the common femoral and higher, or the calf  secondary to significant edema and body habitus   12/07/2017 [facility] Admission   She presented to the ER and was diagnosed with acute DVT  01/18/2018 - 01/21/2018 Hospital Admission   She was admitted to the hospital for management of severe persistent DVT   01/18/2018 Imaging   US venous Doppler Right: No evidence of common femoral vein obstruction. Left: Findings consistent with acute deep vein thrombosis involving the left common femoral vein, and left popliteal vein.   01/19/2018 Surgery   Pre-operative Diagnosis: Subacute DVT with severe post thrombotic syndrome Post-operative diagnosis:  Same Surgeon:  Apolinar Junes C. Randie Heinz, MD Procedure Performed: 1.  Ultrasound-guided cannulation left small saphenous vein 2.  Left lower extremity and central venography 3.  Intravascular ultrasound of left popliteal, femoral, common femoral, external and common iliac veins and IVC 4.  Stent of left common and external iliac veins with 14 x 60 mm Vici 5.  Moderate sedation with fentanyl and Versed for 50 minutes   Indications: 77 year old female with a history of DVT in October now presents with persistent left lower extremity swelling and ultrasound demonstrating likely persistent DVT.  She has been on Xarelto at this time.  She is now indicated for venogram possible intervention.   Findings: Flow in the left lower extremity was stagnant throughout but by venogram all veins were patent.  There was a focal occlusive area approximately 2 cm in length at the common and external iliac vein junction at the hypogastric on the left.  After stenting and ballooning we had a diameter of 12 millimeters in the stent and venogram demonstrated flow in the lower extremity veins were previously was stagnant and no further residual stenosis in the left common and external iliac vein junction.   04/12/2018 Imaging   US Venous Doppler Right: No evidence of common femoral vein obstruction. Left: There is no evidence of  deep vein thrombosis in the lower extremity. However, portions of this examination were limited- see technologist comments above. Left groin: Large hypoechoic area with mixed echoes noted measuring nearly 10 cm. Possible  hematoma versus unknown etiology. Ultrasound characteristics of enlarged lymph nodes noted in the groin.      05/15/2018 Imaging   US Venous Doppler Right: No evidence of deep vein thrombosis in the lower extremity. No indirect evidence of obstruction proximal to the inguinal ligament. Left: No reflux was noted in the common femoral vein , femoral vein in the thigh, popliteal vein, great saphenous vein at the saphenofemoral junction, great saphenous vein at the proximal thigh, great saphenous vein at the mid thigh, great saphenous vein  at the distal thigh, great saphenous vein at the knee, origin of the small saphenous vein, proximal small saphenous vein, and mid small saphenous vein. There is no evidence of deep vein thrombosis in the lower extremity. There is no evidence of superficial venous thrombosis. No cystic structure found in the popliteal fossa. Unable to evaluate extension of common femoral vein obstruction proximal to the inguinal ligament.   06/01/2018 Imaging   1. 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 Additionally, there is lucency involving the medial sidewall of the left acetabulum with potential nondisplaced pathologic fracture. Further evaluation with contrast-enhanced pelvic MRI could be performed as clinically indicated. 2. The left pelvic arterial and venous system is encased by this infiltrative left pelvic sidewall mass however while difficult to ascertain, the left external iliac venous stent appears patent.   06/18/2018 Pathology Results   Lymph node for lymphoma, Left Inguinal - METASTATIC ADENOCARCINOMA, SEE COMMENT. Microscopic  Comment Immunohistochemistry is positive for cytokeratin 7, PAX8, ER,  and PR. Cytokeratin 5/6,and p63 are negative. The immunoprofile along with the patient's history are consistent with a gynecologic primary.   06/18/2018 Surgery   Pre-op Diagnosis: INGUINAL LYMPHADENOPATHY, PELVIC MASS      Procedure(s): EXCISIONAL BIOPSY DEEP LEFT INGUINAL LYMPH NODE   Surgeon(s): Abigail Miyamoto, MD      06/24/2018 Cancer Staging   Staging form: Corpus Uteri - Carcinoma and Carcinosarcoma, AJCC 8th Edition - Clinical: Stage IVB (cT1a, cN2, pM1) - Signed by Artis Delay, MD on 06/24/2018    Genetic Testing   Patient has genetic testing done for MMR on pathology from 06/18/2018. Results revealed patient has the following mutation(s): MMR: abnormal   06/29/2018 Procedure   Placement of a subcutaneous port device. Catheter tip at the SVC and right atrium junction.    Genetic Testing   Patient has genetic testing done for MSI on pathology from 06/18/2018. Results revealed patient has the following mutation(s): MSI: High   07/02/2018 PET scan   Previous hysterectomy, with asymmetric focus of hypermetabolic activity in the left vaginal cuff, suspicious for residual or recurrent carcinoma.   Large hypermetabolic soft tissue mass involving the left pelvic sidewall and acetabulum, consistent with metastatic disease.   No evidence metastatic disease within the abdomen, chest, or neck.   07/09/2018 Tumor Marker   Patient's tumor was tested for the following markers: CA-125 Results of the tumor marker test revealed 9   07/10/2018 - 08/24/2018 Chemotherapy   The patient had carboplatin and taxol x 3 cycles   07/17/2018 Genetic Testing   Negative genetic testing on the common hereditary cancer panel.  The Common Hereditary Gene Panel offered by [facility] includes sequencing and/or deletion duplication testing of the following 48 genes: APC, ATM, AXIN2, BARD1, BMPR1A, BRCA1, BRCA2, BRIP1, CDH1, CDK4, CDKN2A  (p14ARF), CDKN2A (p16INK4a), CHEK2, CTNNA1, DICER1, EPCAM (Deletion/duplication testing only), GREM1 (promoter region deletion/duplication testing only), KIT, MEN1, MLH1, MSH2, MSH3, MSH6, MUTYH, NBN, NF1, NHTL1, PALB2, PDGFRA, PMS2, POLD1, POLE, PTEN, RAD50, RAD51C, RAD51D, RNF43, SDHB, SDHC, SDHD, SMAD4, SMARCA4. STK11, TP53, TSC1, TSC2, and VHL.  The following genes were evaluated for sequence changes only: SDHA and HOXB13 c.251G>A variant only. The report date is Jul 17, 2018.    10/03/2018 Imaging   CT abdomen and pelvis 1.  No acute intra-abdominal process. 2. Grossly unchanged left pelvic sidewall mass with osseous involvement of the medial acetabulum. Progressive mild displacement of the associated comminuted pathologic fracture involving the right acetabulum and puboacetabular junction.  3. New venous stents extending from the left common iliac vein origin to the proximal left common femoral vein. The stents are patent.   11/06/2018 -  Chemotherapy   The patient had pembrolizumab for chemotherapy treatment.     01/28/2019 Imaging   1. No substantial interval change in exam. 2. Interval development of mild fullness in the left intrarenal collecting system and ureter without overt hydronephrosis at this time. 3. Abnormal soft tissue along the left pelvic sidewall has decreased slightly in the interval. 4. Similar appearance of ill-defined fascial planes in the pelvis with some peritoneal thickening along the right pelvic sidewall and potentially involving the sigmoid mesocolon. 5. No substantial ascites.   05/03/2019 Imaging   1. Stable mild left pelvic sidewall soft tissue density. No new or progressive disease identified within the abdomen or pelvis.  2. Colonic diverticulosis. No radiographic evidence of diverticulitis.   Aortic Atherosclerosis (ICD10-I70.0).   09/09/2019 Imaging   1. No change in appearance of soft tissue thickening along the LEFT  pelvic sidewall adjacent to  chronic LEFT acetabular fracture. 2. Mild asymmetry of the bladder wall favoring the LEFT bladder wall, not well assessed. Similar accounting for variable degrees of distension on prior studies potentially related to prior radiation, attention on follow-up. 3. Signs of venous stenting in the LEFT hemipelvis with LEFT lower extremity muscular atrophy and mild stranding with similar appearance. Signs of colonic diverticulosis and diverticular disease without change.   02/24/2020 Imaging   1. Unchanged appearance of the pelvis as detailed below. 2. Unchanged soft tissue thickening of the left pelvic sidewall. 3. 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 4. No evidence discrete mass or lymphadenopathy nor metastatic disease in the abdomen or pelvis. 5. Status post hysterectomy and cholecystectomy. 6. Left common iliac vein stent. 7. Pancolonic diverticulosis.     09/07/2020 Imaging   Stable abnormal soft tissue density in the left pelvic sidewall. No new or progressive disease within the abdomen or pelvis.   Colonic diverticulosis. No radiographic evidence of diverticulitis.   Stable severe destructive left hip arthropathy with fracture involving the medial acetabular wall.     03/01/2021 Imaging   Stable abnormal soft tissue density in the left pelvic sidewall. 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   No new or progressive disease within the abdomen or pelvis.   Colonic diverticulosis, without radiographic evidence of diverticulitis.   Tiny hiatal hernia.   11/08/2021 Imaging   1. 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 2. Cannot exclude wall thickening in the lower  rectum, correlate with digital rectal exam. 3. Other imaging findings of potential clinical significance: Small type 1 hiatal hernia. Colonic diverticulosis. Left common iliac vein and external iliac vein stent graft. 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 Small umbilical hernia contains adipose tissue.   05/13/2022 Imaging   Old/treated left acetabular metastasis with chronic adjacent pelvic sidewall soft tissue thickening. No evidence of recurrent or progressive metastatic disease.   Metastasis to lymph nodes (HCC)  06/23/2018 Initial Diagnosis   Metastasis to lymph nodes (HCC)   07/10/2018 - 08/24/2018 Chemotherapy   The patient had palonosetron (ALOXI) injection 0.25 mg, 0.25 mg, Intravenous,  Once, 3 of 6 cycles Administration: 0.25 mg (07/10/2018), 0.25 mg (07/31/2018), 0.25 mg (08/24/2018) CARBOplatin (PARAPLATIN) 480 mg in sodium chloride 0.9 % 250 mL chemo infusion, 480 mg (100 % of original dose 482.5 mg), Intravenous,  Once, 3 of 6 cycles Dose modification: 482.5 mg (original dose 482.5 mg, Cycle 1) Administration: 480 mg (07/10/2018), 480 mg (07/31/2018), 480 mg (08/24/2018) PACLitaxel (TAXOL) 276 mg in sodium chloride 0.9 % 250 mL chemo infusion (> 80mg /m2), 140 mg/m2 = 276 mg (80 % of original dose 175 mg/m2), Intravenous,  Once, 3 of 6 cycles Dose modification: 140 mg/m2 (80 % of original dose 175 mg/m2, Cycle 1, Reason: Dose Not Tolerated) Administration: 276 mg (07/10/2018), 276 mg (07/31/2018), 276 mg (08/24/2018) fosaprepitant (EMEND) 150 mg, dexamethasone (DECADRON) 12 mg in sodium chloride 0.9 % 145 mL IVPB, , Intravenous,  Once, 3 of 6 cycles Administration:  (07/10/2018),  (07/31/2018),  (08/24/2018)  for chemotherapy treatment.    11/06/2018 -  Chemotherapy   The patient had pembrolizumab for chemotherapy treatment.  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 (HCC)  06/24/2018 Initial Diagnosis    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 (HCC)   07/10/2018 - 08/24/2018 Chemotherapy   The patient had palonosetron (ALOXI) injection 0.25 mg, 0.25 mg, Intravenous,  Once, 3 of 6 cycles Administration: 0.25 mg (07/10/2018), 0.25 mg (07/31/2018), 0.25 mg (08/24/2018) CARBOplatin (PARAPLATIN) 480 mg in sodium chloride 0.9 % 250 mL chemo infusion, 480 mg (100 % of original dose 482.5 mg), Intravenous,  Once, 3 of 6 cycles Dose modification: 482.5 mg (original dose 482.5 mg, Cycle 1) Administration: 480 mg (07/10/2018), 480 mg (07/31/2018), 480 mg (08/24/2018) PACLitaxel (TAXOL) 276 mg in sodium chloride 0.9 % 250 mL chemo infusion (> 80mg /m2), 140 mg/m2 = 276 mg (80 % of original dose 175 mg/m2), Intravenous,  Once, 3 of 6 cycles Dose modification: 140 mg/m2 (80 % of original dose 175 mg/m2, Cycle 1, Reason: Dose Not Tolerated) Administration: 276 mg (07/10/2018), 276 mg (07/31/2018), 276 mg (08/24/2018) fosaprepitant (EMEND) 150 mg, dexamethasone (DECADRON) 12 mg in sodium chloride 0.9 % 145 mL IVPB, , Intravenous,  Once, 3 of 6 cycles Administration:  (07/10/2018),  (07/31/2018),  (08/24/2018)  for chemotherapy treatment.    11/06/2018 -  Chemotherapy   The patient had pembrolizumab for chemotherapy treatment.     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       Interval History: Today she reports that she is overall doing well. She denies new vaginal bleeding, abdominal/pelvic pain, unintentional weight loss, change in bowel or bladder habits, early satiety, bloating, nausea/vomiting. She reports that she has been to the wound center for her legs but they still have chronic changes. She is taking her water pill.   Past Medical/Surgical History: Past Medical History:  Diagnosis  Date   Acute upper respiratory infection 07/06/2014   Anemia    Arthritis    Back    Colon polyp    Tubular Adenoma    Cough productive of clear sputum 06/22/2014   DVT (deep venous thrombosis) (HCC)    Family history of breast cancer    GERD (gastroesophageal reflux disease)    History of right bundle branch block (RBBB)    HOH (hard of hearing)    Hypertension    had in the past, is no longer on medication for this and blood pressures are WNL   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   Primary localized osteoarthritis of right knee    Uterine cancer (HCC) 06/23/2018    Past Surgical History:  Procedure Laterality Date   ABDOMINAL HYSTERECTOMY  2012   CHOLECYSTECTOMY N/A 03/09/2013   Procedure: LAPAROSCOPIC CHOLECYSTECTOMY;  Surgeon: Atilano Ina, MD;  Location[facility];  Service: General;  Laterality: N/A;   COLONOSCOPY W/ BIOPSIES     IR IMAGING GUIDED PORT INSERTION  06/29/2018   LARYNGOSCOPY Left 03/14/2017   Procedure: LARYNGOSCOPY;  Surgeon: Graylin Shiver, MD;  Location: [facility] OR;  Service: ENT;  Laterality: Left;   LOWER EXTREMITY VENOGRAPHY Left 01/19/2018   Procedure: LOWER EXTREMITY VENOGRAPHY;  Surgeon: Maeola Harman, MD;  Location: [facility] INVASIVE CV LAB;  Service: Cardiovascular;  Laterality: Left;   LOWER EXTREMITY VENOGRAPHY N/A 09/08/2018   Procedure: LOWER EXTREMITY VENOGRAPHY;  Surgeon: Maeola Harman, MD;  Location: [facility] INVASIVE CV LAB;  Service: Cardiovascular;  Laterality: N/A;   LYMPH NODE BIOPSY Left 06/18/2018  Procedure: EXCISIONAL BIOPSY LEFT INGUINAL LYMPH NODE;  Surgeon: Abigail Miyamoto, MD;  Location: [facility] OR;  Service: General;  Laterality: Left;   PERIPHERAL VASCULAR INTERVENTION Left 01/19/2018   Procedure: PERIPHERAL VASCULAR INTERVENTION;  Surgeon: Maeola Harman, MD;  Location: [facility] INVASIVE CV LAB;  Service: Cardiovascular;  Laterality: Left;  LEFT ILIAC VENOUS   PERIPHERAL VASCULAR INTERVENTION Left 09/08/2018   Procedure: PERIPHERAL  VASCULAR INTERVENTION;  Surgeon: Maeola Harman, MD;  Location: [facility] INVASIVE CV LAB;  Service: Cardiovascular;  Laterality: Left;  lower extremity   TOTAL KNEE ARTHROPLASTY  04/29/2011   Procedure: TOTAL KNEE ARTHROPLASTY;  Surgeon: Nilda Simmer, MD;  Location: [facility] OR;  Service: Orthopedics;  Laterality: Left;  DR Anne Ng 90 MINUTES FOR THIS CASE   TOTAL KNEE ARTHROPLASTY Right 07/04/2014   Procedure: TOTAL KNEE ARTHROPLASTY;  Surgeon: Salvatore Marvel, MD;  Location: [facility] OR;  Service: Orthopedics;  Laterality: Right;    Family History  Problem Relation Age of Onset   Arthritis Mother    Hypertension Mother    Alzheimer's disease Father    Diabetes Sister    Hypertension Sister    Hypertension Sister    Hypertension Sister    Hypertension Sister    Hypertension Brother    Stroke Brother    Hypertension Brother    Breast cancer Niece 36       sister's daughter   Breast cancer Other        Niece   Anesthesia problems Neg Hx    Hypotension Neg Hx    Malignant hyperthermia Neg Hx    Pseudochol deficiency Neg Hx    Colon cancer Neg Hx    Ovarian cancer Neg Hx    Endometrial cancer Neg Hx    Pancreatic cancer Neg Hx    Prostate cancer Neg Hx     Social History   Socioeconomic History   Marital status: Divorced    Spouse name: Not on file   Number of children: 1   Years of education: Not on file   Highest education level: Not on file  Occupational History   Occupation: Retired   Tobacco Use   Smoking status: Former    Years: 1    Types: Cigarettes    Quit date: 04/22/1988    Years since quitting: 34.2   Smokeless tobacco: Never  Vaping Use   Vaping Use: Never used  Substance and Sexual Activity   Alcohol use: No   Drug use: No   Sexual activity: Not Currently    Birth control/protection: Surgical  Other Topics Concern   Not on file  Social History Narrative   Daily caffeine    Social Determinants of Health   Financial Resource Strain: Not on file   Food Insecurity: Not on file  Transportation Needs: Not on file  Physical Activity: Not on file  Stress: Not on file  Social Connections: Not on file    Current Medications:  Current Outpatient Medications:    acetaminophen (TYLENOL) 325 MG tablet, Take by mouth., Disp: , Rfl:    apixaban (ELIQUIS) 2.5 MG TABS tablet, Take by mouth 2 (two) times daily., Disp: , Rfl:    diclofenac sodium (VOLTAREN) 1 % GEL, APPLY 4GRAMS 4 TIMES A DAY AS NEEDED FOR PAINS, Disp: , Rfl:    furosemide (LASIX) 40 MG tablet, Take 40 mg by mouth daily as needed., Disp: , Rfl:    gabapentin (NEURONTIN) 300 MG capsule, Take 300 mg by mouth 3 (three) times daily.,  Disp: , Rfl:    lidocaine-prilocaine (EMLA) cream, Apply 1 application topically daily as needed., Disp: 30 g, Rfl: 3   methadone (DOLOPHINE) 10 MG tablet, Take 1 tablet (10 mg total) by mouth every 12 (twelve) hours., Disp: 60 tablet, Rfl: 0   morphine (MSIR) 15 MG tablet, Take 1 tablet (15 mg total) by mouth every 6 (six) hours as needed for severe pain., Disp: 60 tablet, Rfl: 0   Olopatadine HCl 0.2 % SOLN, Place 1 drop into both eyes daily., Disp: , Rfl:    potassium chloride (KLOR-CON) 10 MEQ tablet, Take 10 mEq by mouth daily., Disp: , Rfl:   Review of Symptoms: Complete 10-system review is negative except as above in Interval History.  Physical Exam: BP (!) 140/78 (BP Location: Left Arm, Patient Position: Sitting)   Pulse 83   Temp 97.9 F (36.6 C) (Oral)   Resp 16   Ht 5' 2.99" (1.6 m)   Wt 251 lb (113.9 kg)   SpO2 99%   BMI 44.47 kg/m  General: Alert, oriented, no acute distress. HEENT: Normocephalic, atraumatic. Neck symmetric without masses. Sclera anicteric.  Chest: Normal work of breathing. Clear to auscultation bilaterally.   Cardiovascular: Regular rate and rhythm, no murmurs. Abdomen: Soft, nontender.  Normoactive bowel sounds.  No masses appreciated.  Well-healed incisions. Extremities: Using walker.  Some limited mobility  due to left hip pain.  Darkened and thickened/rough skin of bilateral lower extremities and edema equal bilaterally. Skin:Improvement in skin in bilateral groins and pannus Lymphatics: No cervical, supraclavicular, adenopathy.  Thickening and firmness of left groin underlying incision without discrete lymphadenopathy.  GU: Normal appearing external genitalia without erythema, excoriation, or lesions. Additional assistance required to hold left leg due to limited mobility and pain of left hip.  Speculum exam reveals normal vaginal mucosa, no lesions.  Bimanual exam reveals smooth vaginal cuff. Exam limited by body habitus.  Exam chaperoned by Terald Sleeper, LPN   Laboratory & Radiologic Studies: CT ABDOMEN PELVIS W CONTRAST 05/10/2022  Narrative CLINICAL DATA:  Cervical cancer metastatic to bone and lymph nodes. Therapy complete. * Tracking Code: BO *  EXAM: CT ABDOMEN AND PELVIS WITH CONTRAST  TECHNIQUE: Multidetector CT imaging of the abdomen and pelvis was performed using the standard protocol following bolus administration of intravenous contrast.  RADIATION DOSE REDUCTION: This exam was performed according to the departmental dose-optimization program which includes automated exposure control, adjustment of the mA and/or kV according to patient size and/or use of iterative reconstruction technique.  CONTRAST:  OMNIPAQUE IOHEXOL 300 MG/ML  SOLN  COMPARISON:  11/06/2021 and PET 07/02/2018.  FINDINGS: Lower chest: A few scattered pulmonary nodules measure 4 mm or less in size, unchanged from 07/02/2018. No specific follow-up necessary. No pleural fluid. Right-sided central venous catheter terminates at the SVC RA junction, incompletely imaged. Heart is at the upper limits of normal in size to mildly enlarged. No pericardial or pleural effusion. Distal esophagus is grossly unremarkable. No specific follow-up necessary.  Hepatobiliary: Liver is unremarkable.  Cholecystectomy. No biliary ductal dilatation.  Pancreas: Negative.  Spleen: Negative.  Adrenals/Urinary Tract: Adrenal glands and kidneys are unremarkable. Ureters are decompressed. Bladder is grossly unremarkable although there may be a small cystocele.  Stomach/Bowel: Small hiatal hernia. Stomach, small bowel, appendix and colon are unremarkable.  Vascular/Lymphatic: Left iliac vein stent. No pathologically enlarged lymph nodes. Left inguinal lymph nodes measure up to 13 mm, unchanged. Surgical clips in the left groin.  Reproductive: Hysterectomy.  No adnexal mass.  Other:  No free fluid. Minimal haziness and nodularity in the small bowel mesentery, unchanged.  Musculoskeletal: Degenerative changes in the spine. Extensive sclerosis involving the left femoral head and left acetabulum with an old medial acetabular fracture. Associated acetabular protrusio and soft tissue thickening along the left external iliac vasculature.  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 No evidence of recurrent or progressive metastatic disease.   Electronically Signed By: Leanna Battles M.D. On: 05/13/2022 13:27

## 2022-07-15 ENCOUNTER — Encounter: Payer: Self-pay | Admitting: Hematology and Oncology

## 2022-07-15 ENCOUNTER — Encounter: Payer: Self-pay | Admitting: Psychiatry

## 2022-07-15 NOTE — Therapy (Signed)
OUTPATIENT PHYSICAL THERAPY  LOWER EXTREMITY ONCOLOGY EVALUATION  Patient Name: [name] MRN: [id number] DOB:Nov 20, 1945, 77 y.o., female Today's Date: 07/16/2022  END OF SESSION:  PT End of Session - 07/16/22 1223     Visit Number 1    Number of Visits 4    Date for PT Re-Evaluation 10/08/22    PT Start Time 1105    PT Stop Time 1152    PT Time Calculation (min) 47 min    Activity Tolerance Patient tolerated treatment well    Behavior During Therapy [facility] for tasks assessed/performed             Past Medical History:  Diagnosis Date   Acute upper respiratory infection 07/06/2014   Anemia    Arthritis    Back    Colon polyp    Tubular Adenoma    Cough productive of clear sputum 06/22/2014   DVT (deep venous thrombosis) (HCC)    Family history of breast cancer    GERD (gastroesophageal reflux disease)    History of right bundle branch block (RBBB)    HOH (hard of hearing)    Hypertension    had in the past, is no longer on medication for this and blood pressures are WNL   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   Primary localized osteoarthritis of right knee    Uterine cancer (HCC) 06/23/2018   Past Surgical History:  Procedure Laterality Date   ABDOMINAL HYSTERECTOMY  2012   CHOLECYSTECTOMY N/A 03/09/2013   Procedure: LAPAROSCOPIC CHOLECYSTECTOMY;  Surgeon: Atilano Ina, MD;  Location: [facility] OR;  Service: General;  Laterality: N/A;   COLONOSCOPY W/ BIOPSIES     IR IMAGING GUIDED PORT INSERTION  06/29/2018   LARYNGOSCOPY Left 03/14/2017   Procedure: LARYNGOSCOPY;  Surgeon: Graylin Shiver, MD;  Location: [facility] OR;  Service: ENT;  Laterality: Left;   LOWER EXTREMITY VENOGRAPHY Left 01/19/2018   Procedure: LOWER EXTREMITY VENOGRAPHY;  Surgeon: Maeola Harman, MD;  Location: [facility] INVASIVE CV LAB;  Service: Cardiovascular;  Laterality: Left;   LOWER EXTREMITY VENOGRAPHY N/A 09/08/2018   Procedure: LOWER EXTREMITY VENOGRAPHY;  Surgeon: Maeola Harman, MD;   Location: [facility] INVASIVE CV LAB;  Service: Cardiovascular;  Laterality: N/A;   LYMPH NODE BIOPSY Left 06/18/2018   Procedure: EXCISIONAL BIOPSY LEFT INGUINAL LYMPH NODE;  Surgeon: Abigail Miyamoto, MD;  Location: [facility] OR;  Service: General;  Laterality: Left;   PERIPHERAL VASCULAR INTERVENTION Left 01/19/2018   Procedure: PERIPHERAL VASCULAR INTERVENTION;  Surgeon: Maeola Harman, MD;  Location: [facility] INVASIVE CV LAB;  Service: Cardiovascular;  Laterality: Left;  LEFT ILIAC VENOUS   PERIPHERAL VASCULAR INTERVENTION Left 09/08/2018   Procedure: PERIPHERAL VASCULAR INTERVENTION;  Surgeon: Maeola Harman, MD;  Location: [facility] INVASIVE CV LAB;  Service: Cardiovascular;  Laterality: Left;  lower extremity   TOTAL KNEE ARTHROPLASTY  04/29/2011   Procedure: TOTAL KNEE ARTHROPLASTY;  Surgeon: Nilda Simmer, MD;  Location: [facility] OR;  Service: Orthopedics;  Laterality: Left;  DR Anne Ng 90 MINUTES FOR THIS CASE   TOTAL KNEE ARTHROPLASTY Right 07/04/2014   Procedure: TOTAL KNEE ARTHROPLASTY;  Surgeon: Salvatore Marvel, MD;  Location: [facility] OR;  Service: Orthopedics;  Laterality: Right;   Patient Active Problem List   Diagnosis Date Noted   Hyperlipidemia 11/10/2020   Redness and swelling of lower leg 05/26/2020   Cystitis 03/20/2020   Left hip pain 02/25/2020   Acquired hypothyroidism 12/24/2019   Preventive measure 11/12/2019   Physical debility 01/29/2019   Iron  deficiency anemia 01/25/2019   Deficiency anemia 01/08/2019   Acetabulum fracture (HCC) 09/08/2018   Left leg DVT (HCC) 09/07/2018   Multiple open wounds of lower leg    Pancytopenia, acquired (HCC) 07/21/2018   Genetic testing 07/20/2018   Family history of breast cancer    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   Cancer associated pain 06/24/2018   Other constipation 06/24/2018   Goals of care, counseling/discussion 06/24/2018   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 (HCC) 06/24/2018    Uterine cancer (HCC) 06/23/2018   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   Lower leg DVT (deep venous thromboembolism), chronic, left (HCC) 06/23/2018   DVT (deep venous thrombosis) (HCC) 01/18/2018   DJD (degenerative joint disease) of knee 07/04/2014   Primary localized osteoarthritis of right knee    Arthritis    Anemia    GERD (gastroesophageal reflux disease)    Colon polyp    HOH (hard of hearing)    H/O echocardiogram 10/22/2011   Postoperative anemia due to acute blood loss 05/01/2011   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    PCP: Sharmon Revere, MD  REFERRING PROVIDER: Clide Cliff, MD  REFERRING DIAG:  C54.1 (ICD-10-CM) - Endometrial cancer (HCC)  R60.0 (ICD-10-CM) - Bilateral lower extremity edema    THERAPY DIAG:  Deep vein thrombosis (DVT) of iliac vein of left lower extremity, unspecified chronicity (HCC)  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  One time I had a wound on my left leg.  I can put my sock on the Rt foot but not the left.  My feet swell so bad.  Can be better with elevation.  I can't get compression on I have tried and tried   PERTINENT HISTORY: Invasive endometrial carcinoma with total hysterectomy and bilateral SPO with no nodes removed. Lt femoral nerve DVT with surgery in 2019. Recurrence in 2020 showing a left pelvic wall mass and Lt hip pathologic fracture now classified as severe Lt hip arthropathy. Chemo and radiation completed. On blood thinners.    PAIN:  Are you having pain? Yes NPRS scale:  7/10 Pain location: left hip Pain orientation: Left  PAIN TYPE: aching and sharp Pain  description: constant  Aggravating factors: nothing Relieving factors: nothing  PRECAUTIONS: Fall, lymphedema bil  WEIGHT BEARING RESTRICTIONS: No  FALLS:  Has patient fallen in last 6 months? No  LIVING ENVIRONMENT: Lives with: lives alone Lives in: House/apartment; I was getting help but they cut me off with my insurance so I may call and see what I can do  Stairs: No; Has following equipment at home: Dan Humphreys - 4 wheeled, shower chair, sleeps on the bed   OCCUPATION: not working  LEISURE: 5-10 min walks   PRIOR LEVEL OF FUNCTION: Requires assistive device for independence  PATIENT GOALS: can I do anything for the swelling?   OBJECTIVE:  COGNITION: Overall cognitive status: Within functional limits for tasks assessed   PALPATION: Pitting +2 from foot to knee bilateral  OBSERVATIONS / OTHER ASSESSMENTS: Rt LE: shiny skin with multiple papillomas on the anterior shin, varicose veins noted as well as redness and hemosiderin staining. Pt reports redness is normal and no infection  Lt LE: skin is more dry and scaly with old healed wounds on anterior shin. Very firm and with enlarged pores.  Stemmer's sign present bilaterally.    SENSATION: Light touch: Pt reports it always feels numb  LYMPHEDEMA ASSESSMENTS:   LOWER EXTREMITY LANDMARK RIGHT eval  At groin   30 cm proximal to suprapatella   20 cm proximal to suprapatella   10 cm proximal to suprapatella   At midpatella / popliteal crease 59  30 cm proximal to floor at lateral plantar foot 56.6  20 cm proximal to floor at lateral plantar foot 42  10 cm proximal to floor at lateral plantar foot 33.3  Circumference of ankle/heel   5 cm proximal to 1st MTP joint 27.1  Across MTP joint 26.5  Around proximal great toe 9.7  (Blank rows = not tested)  LOWER EXTREMITY LANDMARK LEFT eval  At groin   30 cm proximal to suprapatella   20  cm proximal to suprapatella   10 cm proximal to suprapatella   At midpatella / popliteal crease 59.5  30 cm proximal to floor at lateral plantar foot 56  20 cm proximal to floor at lateral plantar foot 41.7  10 cm proximal to floor at lateral plantar foot 33.1  Circumference of ankle/heel   5 cm proximal to 1st MTP joint 27.2  Across MTP joint 27  Around proximal great toe 9.7  (Blank rows = not tested)  FUNCTIONAL TESTS:  Sit to stand requires RW and use of hands  GAIT: Walks with RW in forward flexion with antalgic gait on the Left leg>Rt  TODAY'S TREATMENT:                                                                                                                                          DATE: 07/16/22 Eval performed Discussed option of treatment: CDT, velcro, frequency and cost of treatment,  requirements for after bandaging.  Pt has a hard time reaching below her left knee due to the hip pain and has never been able to put on stockings She also is unable to come 3x per week as her brother who is her ride had surgery.    PATIENT EDUCATION:  Education details: POC Person educated: Patient Education method: Medical illustrator Education comprehension: verbalized understanding  HOME EXERCISE PROGRAM: NA  ASSESSMENT:  CLINICAL IMPRESSION: Patient is a 77 y.o. female who was seen today for physical therapy evaluation and treatment for her bilateral LE lymphedema.  The leg size is similar bilaterally.  She shows more chronic skin problems on the left leg with healed wounds and increased keratinosis and dry skin.  The Rt leg is more shiny in appearance.  She has a hx of Lt DVT with stenting.  She has been treated at the wound care center for the Lt leg.  She would benefit from bandaging to decrease size of the legs and improve mobility and skin condition but she has mutliple things making this difficult currently: ride availability, being unable to reach her lower legs,  and having no help at home.  We discussed that we could start with a velcro garment for only the left lower leg due to cost.  Will submit an order for a velcro garment for the left lower extremity:    Pt required velcro due to donning and doffing issues and having no assistance at home.  She will be able to use it as able when help is present to decrease risk of infection and to improve skin quality.   OBJECTIVE IMPAIRMENTS: decreased activity tolerance, decreased knowledge of condition, decreased knowledge of use of DME, decreased mobility, and difficulty walking.   ACTIVITY LIMITATIONS: bed mobility and locomotion level  PARTICIPATION LIMITATIONS: meal prep, cleaning, and driving  PERSONAL FACTORS: Age, Transportation, and 1-2 comorbidities: venous insufficiency and radiation hx   are also affecting patient's functional outcome.   REHAB POTENTIAL: Fair    CLINICAL DECISION MAKING: Stable/uncomplicated  EVALUATION COMPLEXITY: Low   GOALS: Goals reviewed with patient? Yes  SHORT TERM GOALS= LTGs Target date: 10/08/22  Pt will obtain velcro for the Lt LE with instruction on use Baseline: Goal status: INITIAL  2.  Pt will have information on lymphedema related skin care and basics Baseline:  Goal status: INITIAL  3.  Pt will know how to request more garments or bandaging if interested Baseline:  Goal status: INITIAL   PLAN:  PT FREQUENCY: 1x/month  PT DURATION: 12 weeks  PLANNED INTERVENTIONS: Therapeutic exercises, Therapeutic activity, Patient/Family education, Self Care, Manual therapy, and Re-evaluation  PLAN FOR NEXT SESSION: edu on velcro use   Idamae Lusher, PT 07/16/2022, 12:26 PM

## 2022-07-16 ENCOUNTER — Other Ambulatory Visit: Payer: Self-pay

## 2022-07-16 ENCOUNTER — Ambulatory Visit: Payer: 59 | Attending: Psychiatry | Admitting: Rehabilitation

## 2022-07-16 ENCOUNTER — Encounter: Payer: Self-pay | Admitting: Rehabilitation

## 2022-07-16 DIAGNOSIS — C541 Malignant neoplasm of endometrium: Secondary | ICD-10-CM | POA: Insufficient documentation

## 2022-07-16 DIAGNOSIS — C774 Secondary and unspecified malignant neoplasm of inguinal and lower limb lymph nodes: Secondary | ICD-10-CM

## 2022-07-16 DIAGNOSIS — R600 Localized edema: Secondary | ICD-10-CM | POA: Diagnosis not present

## 2022-07-16 DIAGNOSIS — I890 Lymphedema, not elsewhere classified: Secondary | ICD-10-CM

## 2022-07-16 DIAGNOSIS — S81809S Unspecified open wound, unspecified lower leg, sequela: Secondary | ICD-10-CM | POA: Insufficient documentation

## 2022-07-16 DIAGNOSIS — I82422 Acute embolism and thrombosis of left iliac vein: Secondary | ICD-10-CM

## 2022-07-16 DIAGNOSIS — C55 Malignant neoplasm of uterus, part unspecified: Secondary | ICD-10-CM | POA: Diagnosis present

## 2022-08-30 ENCOUNTER — Other Ambulatory Visit: Payer: Self-pay

## 2022-08-30 ENCOUNTER — Inpatient Hospital Stay: Payer: 59 | Attending: Hematology and Oncology

## 2022-08-30 DIAGNOSIS — Z8542 Personal history of malignant neoplasm of other parts of uterus: Secondary | ICD-10-CM | POA: Diagnosis present

## 2022-08-30 DIAGNOSIS — C55 Malignant neoplasm of uterus, part unspecified: Secondary | ICD-10-CM

## 2022-08-30 MED ORDER — HEPARIN SOD (PORK) LOCK FLUSH 100 UNIT/ML IV SOLN
500.0000 [IU] | Freq: Once | INTRAVENOUS | Status: AC
  Administered 2022-08-30: 500 [IU]

## 2022-08-30 MED ORDER — SODIUM CHLORIDE 0.9% FLUSH
10.0000 mL | Freq: Once | INTRAVENOUS | Status: AC
  Administered 2022-08-30: 10 mL

## 2022-09-09 ENCOUNTER — Other Ambulatory Visit: Payer: Self-pay | Admitting: Hematology and Oncology

## 2022-09-09 ENCOUNTER — Telehealth: Payer: Self-pay

## 2022-09-09 DIAGNOSIS — C55 Malignant neoplasm of uterus, part unspecified: Secondary | ICD-10-CM

## 2022-09-09 NOTE — Telephone Encounter (Signed)
-----   Message from Artis Delay sent at 09/09/2022  7:48 AM EDT ----- Please help her schedule CT for 9/6 after port flush and see me 9/12

## 2022-09-09 NOTE — Telephone Encounter (Signed)
Called and left a message asking her to call the office back. Appt scheduled on 9/6 for port/lab flush at 11 am, then arrive to [facility] at 1200 for CT, NPO 4 hours prior to CT. Appt scheduled with Dr. Bertis Ruddy on 9/12 at 10 am.

## 2022-09-10 ENCOUNTER — Encounter: Payer: Self-pay | Admitting: Hematology and Oncology

## 2022-09-10 NOTE — Telephone Encounter (Signed)
Called and given appts and below message. She verbalized understanding.

## 2022-10-08 ENCOUNTER — Encounter (HOSPITAL_BASED_OUTPATIENT_CLINIC_OR_DEPARTMENT_OTHER): Payer: 59 | Attending: Internal Medicine | Admitting: Internal Medicine

## 2022-10-08 DIAGNOSIS — I890 Lymphedema, not elsewhere classified: Secondary | ICD-10-CM | POA: Insufficient documentation

## 2022-10-08 DIAGNOSIS — I872 Venous insufficiency (chronic) (peripheral): Secondary | ICD-10-CM | POA: Insufficient documentation

## 2022-10-08 DIAGNOSIS — Z833 Family history of diabetes mellitus: Secondary | ICD-10-CM | POA: Insufficient documentation

## 2022-10-08 DIAGNOSIS — Z87891 Personal history of nicotine dependence: Secondary | ICD-10-CM | POA: Insufficient documentation

## 2022-10-08 DIAGNOSIS — I87312 Chronic venous hypertension (idiopathic) with ulcer of left lower extremity: Secondary | ICD-10-CM | POA: Diagnosis not present

## 2022-10-08 DIAGNOSIS — M1990 Unspecified osteoarthritis, unspecified site: Secondary | ICD-10-CM | POA: Insufficient documentation

## 2022-10-08 DIAGNOSIS — Z86718 Personal history of other venous thrombosis and embolism: Secondary | ICD-10-CM | POA: Insufficient documentation

## 2022-10-08 DIAGNOSIS — Z7901 Long term (current) use of anticoagulants: Secondary | ICD-10-CM | POA: Insufficient documentation

## 2022-10-08 DIAGNOSIS — Z8542 Personal history of malignant neoplasm of other parts of uterus: Secondary | ICD-10-CM | POA: Insufficient documentation

## 2022-10-08 DIAGNOSIS — L97822 Non-pressure chronic ulcer of other part of left lower leg with fat layer exposed: Secondary | ICD-10-CM | POA: Insufficient documentation

## 2022-10-10 NOTE — Progress Notes (Signed)
[name], [name] ([id number]) [id number] Page 1 of 10 Visit Report for 10/08/2022 Chief Complaint Document Details Patient Name: Date of Service: [name], [name]. 10/08/2022 9:30 A M Medical Record Number: [id number] Patient Account Number: [id number] Date of Birth/Sex: Treating RN: 1945/12/08 (77 y.o. F) Primary Care Provider: Sharmon Revere Other Clinician: Referring Provider: Treating Provider/Extender: Jenell Milliner, [name] Weeks in Treatment: 0 Information Obtained from: Patient Chief Complaint 05/09/2022; history of wound to the right posterior leg in the setting of lymphedema 10/08/2022; left posterior leg wound Electronic Signature(s) Signed: 10/08/2022 2:47:41 PM By: Geralyn Corwin DO Entered By: Geralyn Corwin on 10/08/2022 11:09:23 -------------------------------------------------------------------------------- HPI Details Patient Name: Date of Service: [name]. 10/08/2022 9:30 A M Medical Record Number: [id number] Patient Account Number: [id number] Date of Birth/Sex: Treating RN: 02/10/1946 (77 y.o. F) Primary Care Provider: Sharmon Revere Other Clinician: Referring Provider: Treating Provider/Extender: Jenell Milliner, [name] Weeks in Treatment: 0 History of Present Illness HPI Description: 77 year old female presents with left leg wound present at least since May that has been getting worse, in July she was admitted for occlusion of left lower extremity venous stent that was reopened by venoplasty by Dr. Pascal Lux. Patient was on Coumadin for chronic DVT but switch to Eliquis since July. Patient was undergoing treatment for uterine cancer but the chemotherapy is on hold until wound healing is assured. Patient has fair amount of discomfort in the left leg due to the wounds. She also states her left leg has been more swollen than usual although both legs are fairly big Patient is ABI in the clinic today is 1.1 on  the left Patient is history of has history of IVC filter placement, DVT that was totally occlusive on the left that required stenting, that restenosed and required recent management Patient is currently set up with home health with wound care 8/20; this is a patient with a complicated venous history. This apparently includes a chronic IVC filter and a stent in her left iliac vein. She was undergoing chemotherapy for uterine cancer but that is on hold until a fairly large but superficial area on her left anterior tibial area is healed. She is I think booked tomorrow for a thrombolyzes attempt of this stent in the left iliac vein. As such we should be able to wrap her today on her lower extremity. Also problematic is at home health came on Wednesday and only put kerlix on her 8/27; this is a patient who underwent a left common external iliac vein stent for what was thought to be a clot but turned out to be stagnant flow secondary to invasive endometrial cancer. She then underwent radiation therapy and was found to have an occlusive stent. She subsequently underwent mechanical thrombectomy with repeat stenting of the left common external leg veins. She has had an IVC iliac study which demonstrates patent common external iliac vein stents on the left. The greater saphenous vein and saphenofemoral junction does appear occluded. They will see her again in 4 to 6 months. The patient's wound on the left anterior and left lateral lower leg looks really quite healthy. Most of this is 100% epithelialized. She will need bilateral juxta lite stockings which we are going to go ahead and try to order for her today. We have discharged home health who did not show up last week anyway 9/3; the patient came in with a left anterior tibial area wound is closed. She did not come with stockings. We ordered her juxta lite stockings last  week and told her that the right leg would not be covered brackets no open wound] and  depending on the insurance she may have some coverage for the left leg. Her insurance call they do not cover this she comes in with no stocking. 9/10-Patient returns to clinic for having the juxta lights put on, she does have the stockings with her today. Her wounds are healed 05/09/2022 Ms. [name] is a 77 year old female with a past medical history of left leg DVT endometrial cancer, lymphedema that presents to the clinic for a previous , wound to the posterior right leg. She states that an area had blistered up and opened Several weeks ago but has since healed. She does not wear compression stockings. She states they are too difficult to put on. I recommended stocking device to help with this however she states she has this and it also is not sufficient. Currently she denies signs of infection. [name], [name] ([id number]) [id number] Page 2 of 10 10/08/2022 Ms. [name] is a 77 year old female with a past medical history of lymphedema and venous insufficiency that presents to the clinic for a 3 to 4-week history of nonhealing wound to the posterior left leg. She does not wear compression stockings. She states these are too difficult to put on. She has been keeping the area covered. She denies signs of infection. Electronic Signature(s) Signed: 10/08/2022 2:47:41 PM By: Geralyn Corwin DO Entered By: Geralyn Corwin on 10/08/2022 11:10:11 -------------------------------------------------------------------------------- Physical Exam Details Patient Name: Date of Service: [name]. 10/08/2022 9:30 A M Medical Record Number: [id number] Patient Account Number: [id number] Date of Birth/Sex: Treating RN: 1945-06-01 (77 y.o. F) Primary Care Provider: Sharmon Revere Other Clinician: Referring Provider: Treating Provider/Extender: Jenell Milliner, [name] Weeks in Treatment: 0 Constitutional respirations regular, non-labored and within  target range for patient.. Cardiovascular 2+ dorsalis pedis/posterior tibialis pulses. Psychiatric pleasant and cooperative. Notes Left lower extremity: Open wound to the posterior distal leg with weeping and skin breakdown. Lymphedema skin changes throughout the leg. 2+ pitting edema to the knee. No signs of surrounding infection including increased warmth, erythema or purulent drainage. Electronic Signature(s) Signed: 10/08/2022 2:47:41 PM By: Geralyn Corwin DO Entered By: Geralyn Corwin on 10/08/2022 11:10:56 -------------------------------------------------------------------------------- Physician Orders Details Patient Name: Date of Service: [name]. 10/08/2022 9:30 A M Medical Record Number: [id number] Patient Account Number: [id number] Date of Birth/Sex: Treating RN: 07-Aug-1945 (77 y.o. 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 - Dr. Mikey Bussing Room 6 overflow 10/15/2022 Tuesday 145pm Return A ppointment in 2 weeks. - Dr .Mikey Bussing 0945 room 6 Thursday 10/24/2022 Return A Return appointment in 3 weeks. - Dr. Mikey Bussing [facility] office navigators please schedule patient) [name], [name] ([id number]) [id number] Page 3 of 10 Return appointment in 1 month. - Dr. Mikey Bussing (Front office navigators please schedule patient) Anesthetic (In clinic) Topical Lidocaine 4% applied to wound bed Bathing/ Shower/ Hygiene May shower with protection but do not get wound dressing(s) wet. Protect  dressing(s) with water repellant cover (for example, large plastic bag) or a cast cover and may then take shower. Edema Control - Lymphedema / SCD / Other  Elevate legs to the level of the heart or above for 30 minutes daily and/or when sitting for 3-4 times a day throughout the day. Avoid standing for long periods of time. Exercise regularly Wound Treatment Wound #3 - Lower Leg Wound Laterality: Left, Medial, Posterior Cleanser: Soap and Water 1 x Per Week/30 Days Discharge Instructions: May shower and wash wound with dial antibacterial soap and water prior to dressing change. Cleanser: Wound Cleanser 1 x Per Week/30 Days Discharge Instructions: Cleanse the wound with wound cleanser prior to applying a clean dressing using gauze sponges, not tissue or cotton balls. Peri-Wound Care: Sween Lotion (Moisturizing lotion) 1 x Per Week/30 Days Discharge Instructions: Apply moisturizing lotion as directed Topical: Gentamicin 1 x Per Week/30 Days Discharge Instructions: As directed by physician Topical: Mupirocin Ointment 1 x Per Week/30 Days Discharge Instructions: Apply Mupirocin (Bactroban) as instructed Prim Dressing: Maxorb Extra Ag+ Alginate Dressing, 4x4.75 (in/in) 1 x Per Week/30 Days ary Discharge Instructions: Apply to wound bed as instructed Secondary Dressing: ABD Pad, 8x10 1 x Per Week/30 Days Discharge Instructions: Apply over primary dressing as directed. Compression Wrap: Urgo K2 Lite, (equivalent to a 3 layer) two layer compression system, regular 1 x Per Week/30 Days Discharge Instructions: Apply Urgo K2 Lite as directed (alternative to 3 layer compression).****APPLY UNNA BOOT FIRST LAYER TO UPPER PORTION OF LOWER LEG.**** Patient Medications llergies: No Known Drug Allergies A Notifications Medication Indication Start End apply in clinic for 10/08/2022 lidocaine debridement. DOSE topical 4 % cream - cream topical once daily Electronic Signature(s) Signed: 10/08/2022 2:47:41  PM By: Geralyn Corwin DO Entered By: Geralyn Corwin on 10/08/2022 11:12:05 -------------------------------------------------------------------------------- Problem List Details Patient Name: Date of Service: [name]. 10/08/2022 9:30 A M Medical Record Number: [id number] Patient Account Number: [id number] Date of Birth/Sex: Treating RN: 01-24-1946 (77 y.o. F) Primary Care Provider: Sharmon Revere Other Clinician: Referring Provider: Treating Provider/Extender: Jenell Milliner, [name] Weeks in Treatment: [address] MADDELYNN, DIPPOLITO ([id number]) [id number] Page 4 of 10 ICD-10 Encounter Code Description Active Date MDM Diagnosis I89.0 Lymphedema, not elsewhere classified 10/08/2022 No Yes I87.312 Chronic venous hypertension (idiopathic) with ulcer of left lower extremity 10/08/2022 No Yes L97.822 Non-pressure chronic ulcer of other part of left lower leg with fat layer exposed8/20/2024 No Yes Inactive Problems Resolved Problems Electronic Signature(s) Signed: 10/08/2022 2:47:41 PM By: Geralyn Corwin DO Entered By: Geralyn Corwin on 10/08/2022 11:08:44 -------------------------------------------------------------------------------- Progress Note Details Patient Name: Date of Service: [name]. 10/08/2022 9:30 A M Medical Record Number: [id number] Patient Account Number: [id number] Date of Birth/Sex: Treating RN: [birth year] (77 y.o. F) Primary Care Provider: Sharmon Revere Other Clinician: Referring Provider: Treating Provider/Extender: Jenell Milliner, [name] Weeks in Treatment: 0 Subjective Chief Complaint Information obtained from Patient 05/09/2022; history of wound to the right posterior leg in the setting of lymphedema 10/08/2022; left posterior leg wound History of Present Illness (HPI) 77 year old female presents with left leg wound present at least since May that has been getting worse, in July  she was admitted for occlusion of left lower extremity venous stent that was reopened by venoplasty by Dr. Pascal Lux. Patient was on Coumadin for chronic DVT but switch to Eliquis since July. Patient was undergoing treatment for uterine cancer but the chemotherapy is on hold until wound healing is assured. Patient has fair amount of discomfort in the left leg due to the wounds. She also states her left leg has been more swollen than usual although both legs are fairly big Patient is  ABI in the clinic today is 1.1 on the left Patient is history of has history of IVC filter placement, DVT that was totally occlusive on the left that required stenting, that restenosed and required recent management Patient is currently set up with home health with wound care 8/20; this is a patient with a complicated venous history. This apparently includes a chronic IVC filter and a stent in her left iliac vein. She was undergoing chemotherapy for uterine cancer but that is on hold until a fairly large but superficial area on her left anterior tibial area is healed. She is I think booked tomorrow for a thrombolyzes attempt of this stent in the left iliac vein. As such we should be able to wrap her today on her lower extremity. Also problematic is at home health came on Wednesday and only put kerlix on her 8/27; this is a patient who underwent a left common external iliac vein stent for what was thought to be a clot but turned out to be stagnant flow secondary to invasive endometrial cancer. She then underwent radiation therapy and was found to have an occlusive stent. She subsequently underwent mechanical thrombectomy with repeat stenting of the left common external leg veins. She has had an IVC iliac study which demonstrates patent common external iliac vein stents on the left. The greater saphenous vein and saphenofemoral junction does appear occluded. They will see her again in 4 to 6 months. The patient's wound on the  left anterior and left lateral lower leg looks really quite healthy. Most of this is 100% epithelialized. She will need bilateral juxta lite stockings which we are going to go ahead and try to order for her today. We have discharged home health who did not show up last week anyway 9/3; the patient came in with a left anterior tibial area wound is closed. She did not come with stockings. We ordered her juxta lite stockings last week and told her that the right leg would not be covered brackets no open wound] and depending on the insurance she may have some coverage for the left leg. Her insurance call they do not cover this she comes in with no stocking. 9/10-Patient returns to clinic for having the juxta lights put on, she does have the stockings with her today. Her wounds are healed 05/09/2022 Ms. [name] is a 77 year old female with a past medical history of left leg DVT endometrial cancer, lymphedema that presents to the clinic for a previous , wound to the posterior right leg. She states that an area had blistered up and opened Several weeks ago but has since healed. She does not wear compression stockings. She states they are too difficult to put on. I recommended stocking device to help with this however she states she has this and it also is not sufficient. Currently she denies signs of infection. 10/08/2022 TANNY, MITCHELTREE ([id number]) [id number] Page 5 of 10 Ms. Leslian Battaglino is a 77 year old female with a past medical history of lymphedema and venous insufficiency that presents to the clinic for a 3 to 4-week history of nonhealing wound to the posterior left leg. She does not wear compression stockings. She states these are too difficult to put on. She has been keeping the area covered. She denies signs of infection. Patient History Information obtained from Patient. Allergies No Known Drug Allergies Family History Cancer - Maternal Grandparents, Diabetes  - Siblings, No family history of Heart Disease, Hereditary Spherocytosis, Hypertension, Kidney Disease, Lung Disease, Seizures, Stroke,  Thyroid Problems, Tuberculosis. Social History Former smoker - quit over 20 years ago, Marital Status - Single, Alcohol Use - Never, Drug Use - No History, Caffeine Use - Moderate - Coffee. Medical History Eyes Patient has history of Cataracts - removed Denies history of Glaucoma, Optic Neuritis Ear/Nose/Mouth/Throat Patient has history of Chronic sinus problems/congestion Denies history of Middle ear problems Hematologic/Lymphatic Patient has history of Lymphedema - lower legs Denies history of Anemia, Hemophilia, Human Immunodeficiency Virus, Sickle Cell Disease Respiratory Denies history of Aspiration, Asthma, Chronic Obstructive Pulmonary Disease (COPD), Pneumothorax, Tuberculosis Cardiovascular Patient has history of Deep Vein Thrombosis - left leg Denies history of Angina, Arrhythmia, Congestive Heart Failure, Coronary Artery Disease, Hypertension, Hypotension, Myocardial Infarction, Peripheral Arterial Disease, Peripheral Venous Disease, Phlebitis, Vasculitis Gastrointestinal Denies history of Cirrhosis , Colitis, Crohns, Hepatitis A, Hepatitis B, Hepatitis C Endocrine Denies history of Type I Diabetes, Type II Diabetes Genitourinary Denies history of End Stage Renal Disease Immunological Denies history of Lupus Erythematosus, Raynauds, Scleroderma Integumentary (Skin) Denies history of History of Burn Musculoskeletal Patient has history of Osteoarthritis Denies history of Gout, Rheumatoid Arthritis, Osteomyelitis Neurologic Denies history of Dementia, Neuropathy, Quadriplegia, Paraplegia, Seizure Disorder Oncologic Patient has history of Received Chemotherapy, Received Radiation Hospitalization/Surgery History - right port a cath place. - bilateral knee replacements. - hysterectomy. - cholecystectomy. Medical A Surgical History  Notes nd Hematologic/Lymphatic Pancytopenia Gastrointestinal GERD Musculoskeletal DJD left knee Oncologic Uterine Cancer Review of Systems (ROS) Constitutional Symptoms (General Health) Denies complaints or symptoms of Fatigue, Fever, Chills, Marked Weight Change. Eyes Complains or has symptoms of Glasses / Contacts - readers. Denies complaints or symptoms of Dry Eyes, Vision Changes. Ear/Nose/Mouth/Throat Complains or has symptoms of Chronic sinus problems or rhinitis. Respiratory Denies complaints or symptoms of Chronic or frequent coughs, Shortness of Breath. Gastrointestinal Denies complaints or symptoms of Frequent diarrhea, Nausea, Vomiting. Endocrine Denies complaints or symptoms of Heat/cold intolerance. Genitourinary Denies complaints or symptoms of Frequent urination. Integumentary (Skin) Complains or has symptoms of Wounds - left leg. Musculoskeletal Denies complaints or symptoms of Muscle Pain, Muscle Weakness. Neurologic Denies complaints or symptoms of Numbness/parasthesias. Psychiatric Denies complaints or symptoms of Claustrophobia. [name], [name] ([id number]) [id number] Page 6 of 10 Objective Constitutional respirations regular, non-labored and within target range for patient.. Vitals Time Taken: 9:48 AM, Height: 61 in, Source: Stated, Weight: 248 lbs, Source: Stated, BMI: 46.9, Temperature: 97.8 F, Pulse: 81 bpm, Respiratory Rate: 18 breaths/min, Blood Pressure: 128/76 mmHg. Cardiovascular 2+ dorsalis pedis/posterior tibialis pulses. Psychiatric pleasant and cooperative. General Notes: Left lower extremity: Open wound to the posterior distal leg with weeping and skin breakdown. Lymphedema skin changes throughout the leg. 2+ pitting edema to the knee. No signs of surrounding infection including increased warmth, erythema or purulent drainage. Integumentary (Hair, Skin) Wound #3 status is Open. Original cause of wound was  Gradually Appeared. The date acquired was: 09/17/2022. The wound is located on the Left,Medial,Posterior Lower Leg. The wound measures 3cm length x 4cm width x 0.1cm depth; 9.425cm^2 area and 0.942cm^3 volume. There is Fat Layer (Subcutaneous Tissue) exposed. There is no tunneling or undermining noted. There is a medium amount of serosanguineous drainage noted. There is large (67- 100%) pink granulation within the wound bed. There is a small (1-33%) amount of necrotic tissue within the wound bed including Adherent Slough. The periwound skin appearance exhibited: Dry/Scaly. The periwound skin appearance did not exhibit: Callus, Crepitus, Excoriation, Induration, Rash, Scarring, Maceration, Atrophie Blanche, Cyanosis, Ecchymosis, Hemosiderin Staining, Mottled, Pallor, Rubor, Erythema. 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 Her ABIs were 1.02 with palpable pedal pulses suggesting adequate blood flow for healing. No signs of infection on exam. I recommend at this time silver alginate with antibiotic ointment under 3 layer compression. She knows to not get this wet or keep this on for more than 7 days. She will follow-up weekly. Procedures Wound #3 Pre-procedure diagnosis of Wound #3 is a Lymphedema located on the Left,Medial,Posterior Lower Leg . There was a Double Layer Compression Therapy Procedure by Shawn Stall, RN. Post procedure Diagnosis Wound #3: Same as Pre-Procedure Plan Follow-up Appointments: Return Appointment in 1 week. - Dr. Mikey Bussing Room 6 overflow 10/15/2022 Tuesday 145pm Return Appointment in 2 weeks. - Dr .Mikey Bussing 0945 room 6 Thursday 10/24/2022 Return  appointment in 3 weeks. - Dr. Mikey Bussing [facility] office navigators please schedule patient) Return appointment in 1 month. - Dr. Mikey Bussing (Front office navigators please schedule patient) Anesthetic: (In clinic) Topical Lidocaine 4% applied to wound bed Bathing/ Shower/ Hygiene: May shower with protection but do not get wound dressing(s) wet. Protect dressing(s) with water repellant cover (for example, large plastic bag) or a cast cover and may then take shower. Edema Control - Lymphedema / SCD / Other: Elevate legs to the level of the heart or above for 30 minutes daily and/or when sitting for 3-4 times a day throughout the day. Avoid standing for long periods of time. Exercise regularly The following medication(s) was prescribed: lidocaine topical 4 % cream cream topical once daily for apply in clinic for debridement. was prescribed at facility WOUND #3: - Lower Leg Wound Laterality: Left, Medial, Posterior [name], [name] ([id number]) [id number] Page 7 of 10 Cleanser: Soap and Water 1 x Per Week/30 Days Discharge Instructions: May shower and wash wound with dial antibacterial soap and water prior to dressing change. Cleanser: Wound Cleanser 1 x Per Week/30 Days Discharge Instructions: Cleanse the wound with wound cleanser prior to applying a clean dressing using gauze sponges, not tissue or cotton balls. Peri-Wound Care: Sween Lotion (Moisturizing lotion) 1 x Per Week/30 Days Discharge Instructions: Apply moisturizing lotion as directed Topical: Gentamicin 1 x Per Week/30 Days Discharge Instructions: As directed by physician Topical: Mupirocin Ointment 1 x Per Week/30 Days Discharge Instructions: Apply Mupirocin (Bactroban) as instructed Prim Dressing: Maxorb Extra Ag+ Alginate Dressing, 4x4.75 (in/in) 1 x Per Week/30 Days ary Discharge Instructions: Apply to wound bed as instructed Secondary Dressing: ABD Pad, 8x10 1 x Per Week/30 Days Discharge Instructions:  Apply over primary dressing as directed. Com pression Wrap: Urgo K2 Lite, (equivalent to a 3 layer) two layer compression system, regular 1 x Per Week/30 Days Discharge Instructions: Apply Urgo K2 Lite as directed (alternative to 3 layer compression).****APPLY UNNA BOOT FIRST LAYER TO UPPER PORTION OF LOWER LEG.**** 1. Silver alginate with antibiotic ointment under 3 layer compressionleft lower extremity 2. Follow-up in 1 week Electronic Signature(s) Signed: 10/08/2022 2:47:41 PM By: Geralyn Corwin DO Entered By: Geralyn Corwin on 10/08/2022 11:13:34 -------------------------------------------------------------------------------- HxROS Details Patient Name: Date of Service: [name]. 10/08/2022 9:30 A M Medical Record Number: [id number] Patient Account Number: [id number] Date of Birth/Sex: Treating RN: [birth year] (77 y.o. [name] Primary Care Provider: Sharmon Revere Other Clinician: Referring Provider: Treating Provider/Extender: Mikey Bussing,  Cailan Antonucci Paliwal, [name] Weeks in Treatment: 0 Information Obtained From Patient Constitutional Symptoms (General Health) Complaints and Symptoms: Negative for: Fatigue; Fever; Chills; Marked Weight Change Eyes Complaints and Symptoms: Positive for: Glasses / Contacts - readers Negative for: Dry Eyes; Vision Changes Medical History: Positive for: Cataracts - removed Negative for: Glaucoma; Optic Neuritis Ear/Nose/Mouth/Throat Complaints and Symptoms: Positive for: Chronic sinus problems or rhinitis Medical History: Positive for: Chronic sinus problems/congestion Negative for: Middle ear problems Respiratory Complaints and Symptoms: Negative for: Chronic or frequent coughs; Shortness of Breath Medical History: Negative for: Aspiration; Asthma; Chronic Obstructive Pulmonary Disease (COPD); Pneumothorax; Tuberculosis [name], [name] ([id number]) [id number] Page 8 of  10 Gastrointestinal Complaints and Symptoms: Negative for: Frequent diarrhea; Nausea; Vomiting Medical History: Negative for: Cirrhosis ; Colitis; Crohns; Hepatitis A; Hepatitis B; Hepatitis C Past Medical History Notes: GERD Endocrine Complaints and Symptoms: Negative for: Heat/cold intolerance Medical History: Negative for: Type I Diabetes; Type II Diabetes Genitourinary Complaints and Symptoms: Negative for: Frequent urination Medical History: Negative for: End Stage Renal Disease Integumentary (Skin) Complaints and Symptoms: Positive for: Wounds - left leg Medical History: Negative for: History of Burn Musculoskeletal Complaints and Symptoms: Negative for: Muscle Pain; Muscle Weakness Medical History: Positive for: Osteoarthritis Negative for: Gout; Rheumatoid Arthritis; Osteomyelitis Past Medical History Notes: DJD left knee Neurologic Complaints and Symptoms: Negative for: Numbness/parasthesias Medical History: Negative for: Dementia; Neuropathy; Quadriplegia; Paraplegia; Seizure Disorder Psychiatric Complaints and Symptoms: Negative for: Claustrophobia Hematologic/Lymphatic Medical History: Positive for: Lymphedema - lower legs Negative for: Anemia; Hemophilia; Human Immunodeficiency Virus; Sickle Cell Disease Past Medical History Notes: Pancytopenia Cardiovascular Medical History: Positive for: Deep Vein Thrombosis - left leg Negative for: Angina; Arrhythmia; Congestive Heart Failure; Coronary Artery Disease; Hypertension; Hypotension; Myocardial Infarction; Peripheral Arterial Disease; Peripheral Venous Disease; Phlebitis; Vasculitis Immunological Medical History: Negative for: Lupus Erythematosus; Raynauds; Scleroderma Oncologic Medical History: Positive for: Received Chemotherapy; Received Radiation Past Medical History Notes: Uterine Cancer [name], [name] ([id number]) [id number] Page 9 of 10 HBO Extended History  Items Ear/Nose/Mouth/Throat: Eyes: Chronic sinus Cataracts problems/congestion Immunizations Pneumococcal Vaccine: Received Pneumococcal Vaccination: Yes Received Pneumococcal Vaccination On or After 60th Birthday: Yes Implantable Devices Yes Hospitalization / Surgery History Type of Hospitalization/Surgery right port a cath place bilateral knee replacements hysterectomy cholecystectomy Family and Social History Cancer: Yes - Maternal Grandparents; Diabetes: Yes - Siblings; Heart Disease: No; Hereditary Spherocytosis: No; Hypertension: No; Kidney Disease: No; Lung Disease: No; Seizures: No; Stroke: No; Thyroid Problems: No; Tuberculosis: No; Former smoker - quit over 20 years ago; Marital Status - Single; Alcohol Use: Never; Drug Use: No History; Caffeine Use: Moderate - Coffee; Financial Concerns: No; Food, Clothing or Shelter Needs: No; Support System Lacking: No; Transportation Concerns: No Electronic Signature(s) Signed: 10/08/2022 2:47:41 PM By: Geralyn Corwin DO Signed: 10/08/2022 5:13:11 PM By: Shawn Stall RN, BSN Signed: 10/10/2022 5:02:38 PM By: Thayer Dallas Entered By: Thayer Dallas on 10/08/2022 10:12:11 -------------------------------------------------------------------------------- SuperBill Details Patient Name: Date of Service: KAYTE, DEMASTUS 10/08/2022 Medical Record Number: [id number] Patient Account Number: [id number] Date of Birth/Sex: Treating RN: 1945/09/09 (77 y.o. 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

## 2022-10-10 NOTE — Progress Notes (Signed)
MAHEALANI, BENBROOK ([id number]) [id number] Page 1 of 4 Visit Report for 10/08/2022 Abuse Risk Screen Details Patient Name: Date of Service: [name], [name]. 10/08/2022 9:30 A M Medical Record Number: [id number] Patient Account Number: [id number] Date of Birth/Sex: Treating RN: 1945/04/04 (77 y.o. F) Primary Care Mort Smelser: Sharmon Revere Other Clinician: Referring Ladarion Munyon: Treating Foye Damron/Extender: Jenell Milliner, Himanshu Weeks in Treatment: 0 Abuse Risk Screen Items Answer ABUSE RISK SCREEN: Has anyone close to you tried to hurt or harm you recentlyo No Do you feel uncomfortable with anyone in your familyo No Has anyone forced you do things that you didnt want to doo No Electronic Signature(s) Signed: 10/10/2022 5:02:38 PM By: Thayer Dallas Entered By: Thayer Dallas on 10/08/2022 06:58:22 -------------------------------------------------------------------------------- Activities of Daily Living Details Patient Name: Date of Service: DARENE, Duncan 10/08/2022 9:30 A M Medical Record Number: [id number] Patient Account Number: [id number] Date of Birth/Sex: Treating RN: 1945-12-25 (78 y.o. F) Primary Care Paiden Caraveo: Sharmon Revere Other Clinician: Referring Dyer Klug: Treating Cathleen Yagi/Extender: Jenell Milliner, Himanshu Weeks in Treatment: 0 Activities of Daily Living Items Answer Activities of Daily Living (Please select one for each item) Drive Automobile Not Able T Medications ake Completely Able Use T elephone Completely Able Care for Appearance Completely Able Use T oilet Completely Able Bath / Shower Completely Able Dress Self Completely Able Feed Self Completely Able Walk Completely Able Get In / Out Bed Completely Able Housework Completely Able Prepare Meals Completely Able Handle Money Completely Able Shop for Self Completely Able Electronic Signature(s) Signed: 10/10/2022 5:02:38 PM By: Thayer Dallas Entered By: Thayer Dallas on 10/08/2022 06:59:20 [name] ([id number]) [id number] Page 2 of 4 -------------------------------------------------------------------------------- Education Screening Details Patient Name: Date of Service: MARVEL, RASH. 10/08/2022 9:30 A M Medical Record Number: [id number] Patient Account Number: [id number] Date of Birth/Sex: Treating RN: Oct 18, 1945 (77 y.o. F) Primary Care Laportia Carley: Sharmon Revere Other Clinician: Referring Ivan Lacher: Treating Jelina Paulsen/Extender: Jenell Milliner, Himanshu Weeks in Treatment: 0 Learning Preferences/Education Level/Primary Language Learning Preference: Explanation, Demonstration, Printed Material Highest Education Level: High School Preferred Language: English Cognitive Barrier Language Barrier: No Translator Needed: No Memory Deficit: No Emotional Barrier: No Cultural/Religious Beliefs Affecting Medical Care: No Physical Barrier Impaired Vision: Yes Glasses, reading Impaired Hearing: Yes Hearing Aid Knowledge/Comprehension Knowledge Level: High Comprehension Level: High Ability to understand written instructions: High Ability to understand verbal instructions: High Motivation Anxiety Level: Calm Cooperation: Cooperative Education Importance: Acknowledges Need Interest in Health Problems: Asks Questions Perception: Coherent Willingness to Engage in Self-Management High Activities: Readiness to Engage in Self-Management High Activities: Electronic Signature(s) Signed: 10/10/2022 5:02:38 PM By: Thayer Dallas Entered By: Thayer Dallas on 10/08/2022 07:00:55 -------------------------------------------------------------------------------- Fall Risk Assessment Details Patient Name: Date of Service: [name]. 10/08/2022 9:30 A M Medical Record Number: [id number] Patient Account Number: [id number] Date of Birth/Sex: Treating RN: [birth year] (77 y.o.  F) Primary Care Onika Gudiel: Sharmon Revere Other Clinician: Referring Nicholai Willette: Treating Bhargav Barbaro/Extender: Jenell Milliner, Himanshu Weeks in Treatment: 0 Fall Risk Assessment Items Have you had 2 or more falls in the last 12 monthso 0 No Have you had any fall that resulted in injury in the last 12 monthso 0 No FALLS RISK SCREEN DEMETRISS, KO ([id number]) [id number] Page 3 of 4 History of falling - immediate or within 3 months 0 No Secondary diagnosis (Do you have 2 or more medical diagnoseso) 0 No Ambulatory aid None/bed rest/wheelchair/nurse 0 No Crutches/cane/walker 15 Yes Furniture 0 No Intravenous therapy Access/Saline/Heparin Lock 0  No Gait/Transferring Normal/ bed rest/ wheelchair 0 No Weak (short steps with or without shuffle, stooped but able to lift head while walking, may seek 0 No support from furniture) Impaired (short steps with shuffle, may have difficulty arising from chair, head down, impaired 0 No balance) Mental Status Oriented to own ability 0 Yes Electronic Signature(s) Signed: 10/10/2022 5:02:38 PM By: Thayer Dallas Entered By: Thayer Dallas on 10/08/2022 07:01:49 -------------------------------------------------------------------------------- Foot Assessment Details Patient Name: Date of Service: [name]. 10/08/2022 9:30 A M Medical Record Number: [id number] Patient Account Number: [id number] Date of Birth/Sex: Treating RN: 1945/07/21 (77 y.o. F) Primary Care Deunte Bledsoe: Sharmon Revere Other Clinician: Referring Denelda Akerley: Treating Quentez Lober/Extender: Jenell Milliner, Himanshu Weeks in Treatment: 0 Foot Assessment Items Site Locations + = Sensation present, - = Sensation absent, C = Callus, U = Ulcer R = Redness, W = Warmth, M = Maceration, PU = Pre-ulcerative lesion F = Fissure, S = Swelling, D = Dryness Assessment Right: Left: Other Deformity: No No Prior Foot Ulcer: No No Prior  Amputation: No No Charcot Joint: No No Ambulatory Status: Ambulatory With Help Assistance Device: Walker GaitLUVENA, [name] ([id number]) [id number] Page 4 of 4 Electronic Signature(s) Signed: 10/10/2022 5:02:38 PM By: Thayer Dallas Entered By: Thayer Dallas on 10/08/2022 07:20:58 -------------------------------------------------------------------------------- Nutrition Risk Screening Details Patient Name: Date of Service: WILLODEEN, HOWITT 10/08/2022 9:30 A M Medical Record Number: [id number] Patient Account Number: [id number] Date of Birth/Sex: Treating RN: 1945-05-06 (77 y.o. F) Primary Care Diany Formosa: Sharmon Revere Other Clinician: Referring Audriana Aldama: Treating Brittiany Wiehe/Extender: Jenell Milliner, Himanshu Weeks in Treatment: 0 Height (in): 61 Weight (lbs): 248 Body Mass Index (BMI): 46.9 Nutrition Risk Screening Items Score Screening NUTRITION RISK SCREEN: I have an illness or condition that made me change the kind and/or amount of food I eat 2 Yes I eat fewer than two meals per day 3 Yes I eat few fruits and vegetables, or milk products 0 No I have three or more drinks of beer, liquor or wine almost every day 0 No I have tooth or mouth problems that make it hard for me to eat 0 No I don't always have enough money to buy the food I need 0 No I eat alone most of the time 0 No I take three or more different prescribed or over-the-counter drugs a day 1 Yes Without wanting to, I have lost or gained 10 pounds in the last six months 2 Yes I am not always physically able to shop, cook and/or feed myself 0 No Nutrition Protocols Good Risk Protocol Moderate Risk Protocol High Risk Proctocol 0 Provide education on nutrition Risk Level: High Risk Score: 8 Electronic Signature(s) Signed: 10/10/2022 5:02:38 PM By: Thayer Dallas Entered By: Thayer Dallas on 10/08/2022 07:02:23

## 2022-10-10 NOTE — Progress Notes (Signed)
FERRIS, HERMOSILLO ([id number]) [id number] Page 1 of 7 Visit Report for 10/08/2022 Allergy List Details Patient Name: Date of Service: ESSANCE, MCSPADDEN. 10/08/2022 9:30 A M Medical Record Number: [id number] Patient Account Number: [id number] Date of Birth/Sex: Treating RN: Jan 04, 1946 (77 y.o. Arta Silence Primary Care Jennalyn Cawley: Sharmon Revere Other Clinician: Referring Sala Tague: Treating Eirene Rather/Extender: Jenell Milliner, Himanshu Weeks in Treatment: 0 Allergies Active Allergies No Known Drug Allergies Allergy Notes Electronic Signature(s) Signed: 10/10/2022 5:02:38 PM By: Thayer Dallas Entered By: Thayer Dallas on 10/08/2022 09:58:10 -------------------------------------------------------------------------------- Arrival Information Details Patient Name: Date of Service: [name]. 10/08/2022 9:30 A M Medical Record Number: [id number] Patient Account Number: [id number] Date of Birth/Sex: Treating RN: Apr 02, 1945 (77 y.o. F) Primary Care Modesto Ganoe: Sharmon Revere Other Clinician: Referring Ahjanae Cassel: Treating Keylee Shrestha/Extender: Jenell Milliner, Himanshu Weeks in Treatment: 0 Visit Information Patient Arrived: Walker Arrival Time: 09:44 Accompanied By: self Transfer Assistance: None Patient Identification Verified: Yes Secondary Verification Process Completed: Yes Patient Requires Transmission-Based Precautions: No Patient Has Alerts: Yes Patient Alerts: Patient on Blood Thinner History Since Last Visit Electronic Signature(s) Signed: 10/10/2022 5:02:38 PM By: Thayer Dallas Entered By: Thayer Dallas on 10/08/2022 09:48:31 -------------------------------------------------------------------------------- Compression Therapy Details Patient Name: Date of Service: [name]. 10/08/2022 9:30 A Desma Paganini ([id number]) [id number] Page 2 of 7 Medical Record Number: [id number] Patient  Account Number: [id number] Date of Birth/Sex: Treating RN: 1945-10-31 (77 y.o. Arta Silence Primary Care Spencer Peterkin: Sharmon Revere Other Clinician: Referring Dashun Borre: Treating Hally Colella/Extender: Jenell Milliner, Himanshu Weeks in Treatment: 0 Compression Therapy Performed for Wound Assessment: Wound #3 Left,Medial,Posterior Lower Leg Performed By: Clinician Shawn Stall, RN Compression Type: Double Layer Post Procedure Diagnosis Same as Pre-procedure Electronic Signature(s) Signed: 10/08/2022 5:13:11 PM By: Shawn Stall RN, BSN Entered By: Shawn Stall on 10/08/2022 10:54:42 -------------------------------------------------------------------------------- Lower Extremity Assessment Details Patient Name: Date of Service: [name]. 10/08/2022 9:30 A M Medical Record Number: [id number] Patient Account Number: [id number] Date of Birth/Sex: Treating RN: [birth year] (77 y.o. F) Primary Care Claudetta Sallie: Sharmon Revere Other Clinician: Referring Haydon Kalmar: Treating Argil Mahl/Extender: Jenell Milliner, Himanshu Weeks in Treatment: 0 Edema Assessment Assessed: [Left: Yes] [Right: No] Edema: [Left: Ye] [Right: s] Calf Left: Right: Point of Measurement: 35 cm From Medial Instep 54 cm Ankle Left: Right: Point of Measurement: 13 cm From Medial Instep 33 cm Knee To Floor Left: Right: From Medial Instep 39.5 cm Vascular Assessment Pulses: Dorsalis Pedis Palpable: [Left:Yes] Doppler Audible: [Left:Yes] Extremity colors, hair growth, and conditions: Extremity Color: [Left:Hyperpigmented] Hair Growth on Extremity: [Left:No] Temperature of Extremity: [Left:Warm] Capillary Refill: [Left:< 3 seconds] Dependent Rubor: [Left:No] Blanched when Elevated: [Left:No] Lipodermatosclerosis: [Left:No] Blood Pressure: Brachial: [Left:128] Ankle: [Left:Dorsalis Pedis: 130 1.02] Toe Nail Assessment Left: Right: ThickMIYEKO, CAUFIELD ([id number])  [id number] Page 3 of 7 Discolored: Yes Deformed: Yes Improper Length and Hygiene: Yes Electronic Signature(s) Signed: 10/08/2022 2:47:41 PM By: Geralyn Corwin DO Entered By: Geralyn Corwin on 10/08/2022 10:51:58 -------------------------------------------------------------------------------- Multi Wound Chart Details Patient Name: Date of Service: [name]. 10/08/2022 9:30 A M Medical Record Number: [id number] Patient Account Number: [id number] Date of Birth/Sex: Treating RN: 04-25-1945 (77 y.o. F) Primary Care Jerzey Komperda: Sharmon Revere Other Clinician: Referring Mari Battaglia: Treating Jadasia Haws/Extender: Jenell Milliner, Himanshu Weeks in Treatment: 0 Vital Signs Height(in): 61 Pulse(bpm): 81 Weight(lbs): 248 Blood Pressure(mmHg): 128/76 Body Mass Index(BMI): 46.9 Temperature(F): 97.8 Respiratory Rate(breaths/min): 18 [3:Photos:] [N/A:N/A] Left, Medial, Posterior Lower Leg N/A N/A Wound Location: Gradually Appeared N/A N/A Wounding Event:  Lymphedema N/A N/A Primary Etiology: Cataracts, Chronic sinus N/A N/A Comorbid History: problems/congestion, Lymphedema, Deep Vein Thrombosis, Osteoarthritis, Received Chemotherapy, Received Radiation 09/17/2022 N/A N/A Date Acquired: 0 N/A N/A Weeks of Treatment: Open N/A N/A Wound Status: No N/A N/A Wound Recurrence: 3x4x0.1 N/A N/A Measurements L x W x D (cm) 9.425 N/A N/A A (cm) : rea 0.942 N/A N/A Volume (cm) : Partial Thickness N/A N/A Classification: Medium N/A N/A Exudate A mount: Serosanguineous N/A N/A Exudate Type: red, brown N/A N/A Exudate Color: Large (67-100%) N/A N/A Granulation A mount: Pink N/A N/A Granulation Quality: Small (1-33%) N/A N/A Necrotic A mount: Fat Layer (Subcutaneous Tissue): Yes N/A N/A Exposed Structures: Fascia: No Tendon: No Muscle: No Joint: No Bone: No None N/A N/A Epithelialization: Excoriation: No N/A N/A Periwound Skin  Texture: Induration: No Callus: No Crepitus: No Rash: No Scarring: No NIANI, HARPRING ([id number]) [id number] Page 4 of 7 Dry/Scaly: Yes N/A N/A Periwound Skin Moisture: Maceration: No Atrophie Blanche: No N/A N/A Periwound Skin Color: Cyanosis: No Ecchymosis: No Erythema: No Hemosiderin Staining: No Mottled: No Pallor: No Rubor: No Compression Therapy N/A N/A Procedures Performed: Treatment Notes Electronic Signature(s) Signed: 10/08/2022 2:47:41 PM By: Geralyn Corwin DO Entered By: Geralyn Corwin on 10/08/2022 11:08:49 -------------------------------------------------------------------------------- Multi-Disciplinary Care Plan Details Patient Name: Date of Service: [name]. 10/08/2022 9:30 A M Medical Record Number: [id number] Patient Account Number: [id number] Date of Birth/Sex: Treating RN: [birth year] (77 y.o. Arta Silence Primary Care Ogden Handlin: Sharmon Revere Other Clinician: Referring Liya Strollo: Treating Crosby Bevan/Extender: Jenell Milliner, Himanshu Weeks in Treatment: 0 Active Inactive Nutrition Nursing Diagnoses: Potential for alteratiion in Nutrition/Potential for imbalanced nutrition Goals: Patient/caregiver agrees to and verbalizes understanding of need to use nutritional supplements and/or vitamins as prescribed Date Initiated: 10/08/2022 Target Resolution Date: 10/25/2022 Goal Status: Active Interventions: Provide education on nutrition Treatment Activities: Patient referred to Primary Care Physician for further nutritional evaluation : 10/08/2022 Notes: Orientation to the Wound Care Program Nursing Diagnoses: Knowledge deficit related to the wound healing center program Goals: Patient/caregiver will verbalize understanding of the Wound Healing Center Program Date Initiated: 10/08/2022 Target Resolution Date: 10/24/2022 Goal Status: Active Interventions: Provide education on orientation to the wound  center Notes: Pain, Acute or Chronic Nursing Diagnoses: Pain, acute or chronic: actual or potential Potential alteration in comfort, pain GLADIES, UNGAR ([id number]) [id number] Page 5 of 7 Goals: Patient will verbalize adequate pain control and receive pain control interventions during procedures as needed Date Initiated: 10/08/2022 Target Resolution Date: 10/24/2022 Goal Status: Active Patient/caregiver will verbalize comfort level met Date Initiated: 10/08/2022 Target Resolution Date: 10/25/2022 Goal Status: Active Interventions: Encourage patient to take pain medications as prescribed Provide education on pain management Reposition patient for comfort Treatment Activities: Administer pain control measures as ordered : 10/08/2022 Notes: Electronic Signature(s) Signed: 10/08/2022 5:13:11 PM By: Shawn Stall RN, BSN Entered By: Shawn Stall on 10/08/2022 10:54:28 -------------------------------------------------------------------------------- Patient/Caregiver Education Details Patient Name: Date of Service: [name] 8/20/2024andnbsp9:30 A M Medical Record Number: [id number] Patient Account Number: [id number] Date of Birth/Gender: Treating RN: 05/06/1945 (77 y.o. Arta Silence Primary Care Physician: Sharmon Revere Other Clinician: Referring Physician: Treating Physician/Extender: Jenell Milliner, Himanshu Weeks in Treatment: 0 Education Assessment Education Provided To: Patient Education Topics Provided Venous: Handouts: Controlling Swelling with Compression Stockings Methods: Explain/Verbal Responses: Reinforcements needed Electronic Signature(s) Signed: 10/08/2022 5:13:11 PM By: Shawn Stall RN, BSN Entered By: Shawn Stall on 10/08/2022 11:02:23 -------------------------------------------------------------------------------- Wound Assessment Details Patient Name: Date of Service: Elliot Gurney  M. 10/08/2022 9:30 A  M Medical Record Number: [id number] Patient Account Number: [id number] Date of Birth/Sex: Treating RN: 1946-01-01 (77 y.o. F) Primary Care Owyn Raulston: Sharmon Revere Other Clinician: Referring Kyleigha Markert: Treating Aarish Rockers/Extender: Jenell Milliner, Himanshu Weeks in Treatment: 0 [place], Daniel Nones ([id number]) [id number] Page 6 of 7 Wound Status Wound Number: 3 Primary Lymphedema Etiology: Wound Location: Left, Medial, Posterior Lower Leg Wound Open Wounding Event: Gradually Appeared Status: Date Acquired: 09/17/2022 Comorbid Cataracts, Chronic sinus problems/congestion, Lymphedema, Deep Weeks Of Treatment: 0 History: Vein Thrombosis, Osteoarthritis, Received Chemotherapy, Received Clustered Wound: No Radiation Photos Wound Measurements Length: (cm) 3 Width: (cm) 4 Depth: (cm) 0.1 Area: (cm) 9.425 Volume: (cm) 0.942 % Reduction in Area: % Reduction in Volume: Epithelialization: None Tunneling: No Undermining: No Wound Description Classification: Partial Thickness Exudate Amount: Medium Exudate Type: Serosanguineous Exudate Color: red, brown Foul Odor After Cleansing: No Slough/Fibrino No Wound Bed Granulation Amount: Large (67-100%) Exposed Structure Granulation Quality: Pink Fascia Exposed: No Necrotic Amount: Small (1-33%) Fat Layer (Subcutaneous Tissue) Exposed: Yes Necrotic Quality: Adherent Slough Tendon Exposed: No Muscle Exposed: No Joint Exposed: No Bone Exposed: No Periwound Skin Texture Texture Color No Abnormalities Noted: No No Abnormalities Noted: No Callus: No Atrophie Blanche: No Crepitus: No Cyanosis: No Excoriation: No Ecchymosis: No Induration: No Erythema: No Rash: No Hemosiderin Staining: No Scarring: No Mottled: No Pallor: No Moisture Rubor: No No Abnormalities Noted: No Dry / Scaly: Yes Maceration: No Electronic Signature(s) Signed: 10/10/2022 5:02:38 PM By: Thayer Dallas Entered By: Thayer Dallas on 10/08/2022 10:28:25 Vitals Details -------------------------------------------------------------------------------- [name] ([id number]) [id number] Page 7 of 7 Patient Name: Date of Service: GIARA, ZORA. 10/08/2022 9:30 A M Medical Record Number: [id number] Patient Account Number: [id number] Date of Birth/Sex: Treating RN: 02/24/1945 (77 y.o. F) Primary Care Urania Pearlman: Sharmon Revere Other Clinician: Referring Avon Molock: Treating Money Mckeithan/Extender: Jenell Milliner, Himanshu Weeks in Treatment: 0 Vital Signs Time Taken: 09:48 Temperature (F): 97.8 Height (in): 61 Pulse (bpm): 81 Source: Stated Respiratory Rate (breaths/min): 18 Weight (lbs): 248 Blood Pressure (mmHg): 128/76 Source: Stated Reference Range: 80 - 120 mg / dl Body Mass Index (BMI): 46.9 Electronic Signature(s) Signed: 10/10/2022 5:02:38 PM By: Thayer Dallas Entered By: Thayer Dallas on 10/08/2022 09:55:40

## 2022-10-15 ENCOUNTER — Encounter (HOSPITAL_BASED_OUTPATIENT_CLINIC_OR_DEPARTMENT_OTHER): Payer: 59 | Admitting: Internal Medicine

## 2022-10-15 DIAGNOSIS — I872 Venous insufficiency (chronic) (peripheral): Secondary | ICD-10-CM | POA: Diagnosis not present

## 2022-10-15 DIAGNOSIS — Z87891 Personal history of nicotine dependence: Secondary | ICD-10-CM | POA: Diagnosis not present

## 2022-10-15 DIAGNOSIS — I87312 Chronic venous hypertension (idiopathic) with ulcer of left lower extremity: Secondary | ICD-10-CM

## 2022-10-15 DIAGNOSIS — Z8542 Personal history of malignant neoplasm of other parts of uterus: Secondary | ICD-10-CM | POA: Diagnosis not present

## 2022-10-15 DIAGNOSIS — I890 Lymphedema, not elsewhere classified: Secondary | ICD-10-CM

## 2022-10-15 DIAGNOSIS — L97822 Non-pressure chronic ulcer of other part of left lower leg with fat layer exposed: Secondary | ICD-10-CM | POA: Diagnosis not present

## 2022-10-15 DIAGNOSIS — Z86718 Personal history of other venous thrombosis and embolism: Secondary | ICD-10-CM | POA: Diagnosis not present

## 2022-10-15 DIAGNOSIS — M1990 Unspecified osteoarthritis, unspecified site: Secondary | ICD-10-CM | POA: Diagnosis not present

## 2022-10-15 DIAGNOSIS — Z833 Family history of diabetes mellitus: Secondary | ICD-10-CM | POA: Diagnosis not present

## 2022-10-15 DIAGNOSIS — Z7901 Long term (current) use of anticoagulants: Secondary | ICD-10-CM | POA: Diagnosis not present

## 2022-10-15 NOTE — Progress Notes (Signed)
[name], [name] ([id number]) [id number] Page 1 of 9 Visit Report for 10/15/2022 Chief Complaint Document Details Patient Name: Date of Service: [name], [name] 10/15/2022 1:45 PM Medical Record Number: [id number] Patient Account Number: [id number] Date of Birth/Sex: Treating RN: 12-Oct-1945 (77 y.o. F) Primary Care Provider: Sharmon Revere Other Clinician: Referring Provider: Treating Provider/Extender: Jenell Milliner, Himanshu Weeks in Treatment: 1 Information Obtained from: Patient Chief Complaint 05/09/2022; history of wound to the right posterior leg in the setting of lymphedema 10/08/2022; left posterior leg wound Electronic Signature(s) Signed: 10/15/2022 4:43:58 PM By: Geralyn Corwin DO Entered By: Geralyn Corwin on 10/15/2022 14:33:18 -------------------------------------------------------------------------------- HPI Details Patient Name: Date of Service: Leslie Lofty. 10/15/2022 1:45 PM Medical Record Number: [id number] Patient Account Number: [id number] Date of Birth/Sex: Treating RN: 1945-11-19 (77 y.o. F) Primary Care Provider: Sharmon Revere Other Clinician: Referring Provider: Treating Provider/Extender: Jenell Milliner, Himanshu Weeks in Treatment: 1 History of Present Illness HPI Description: 77 year old female presents with left leg wound present at least since May that has been getting worse, in July she was admitted for occlusion of left lower extremity venous stent that was reopened by venoplasty by Dr. Pascal Lux. Patient was on Coumadin for chronic DVT but switch to Eliquis since July. Patient was undergoing treatment for uterine cancer but the chemotherapy is on hold until wound healing is assured. Patient has fair amount of discomfort in the left leg due to the wounds. She also states her left leg has been more swollen than usual although both legs are fairly big Patient is ABI in the clinic today is 1.1 on the  left Patient is history of has history of IVC filter placement, DVT that was totally occlusive on the left that required stenting, that restenosed and required recent management Patient is currently set up with home health with wound care 8/20; this is a patient with a complicated venous history. This apparently includes a chronic IVC filter and a stent in her left iliac vein. She was undergoing chemotherapy for uterine cancer but that is on hold until a fairly large but superficial area on her left anterior tibial area is healed. She is I think booked tomorrow for a thrombolyzes attempt of this stent in the left iliac vein. As such we should be able to wrap her today on her lower extremity. Also problematic is at home health came on Wednesday and only put kerlix on her 8/27; this is a patient who underwent a left common external iliac vein stent for what was thought to be a clot but turned out to be stagnant flow secondary to invasive endometrial cancer. She then underwent radiation therapy and was found to have an occlusive stent. She subsequently underwent mechanical thrombectomy with repeat stenting of the left common external leg veins. She has had an IVC iliac study which demonstrates patent common external iliac vein stents on the left. The greater saphenous vein and saphenofemoral junction does appear occluded. They will see her again in 4 to 6 months. The patient's wound on the left anterior and left lateral lower leg looks really quite healthy. Most of this is 100% epithelialized. She will need bilateral juxta lite stockings which we are going to go ahead and try to order for her today. We have discharged home health who did not show up last week anyway 9/3; the patient came in with a left anterior tibial area wound is closed. She did not come with stockings. We ordered her juxta lite stockings last week and  told her that the right leg would not be covered brackets no open wound] and  depending on the insurance she may have some coverage for the left leg. Her insurance call they do not cover this she comes in with no stocking. 9/10-Patient returns to clinic for having the juxta lights put on, she does have the stockings with her today. Her wounds are healed 05/09/2022 [name] is a 77 year old female with a past medical history of left leg DVT endometrial cancer, lymphedema that presents to the clinic for a previous , wound to the posterior right leg. She states that an area had blistered up and opened Several weeks ago but has since healed. She does not wear compression stockings. She states they are too difficult to put on. I recommended stocking device to help with this however she states she has this and it also is not sufficient. Currently she denies signs of infection. [name], [name] ([id number]) [id number] Page 2 of 9 10/08/2022 [name] is a 77 year old female with a past medical history of lymphedema and venous insufficiency that presents to the clinic for a 3 to 4-week history of nonhealing wound to the posterior left leg. She does not wear compression stockings. She states these are too difficult to put on. She has been keeping the area covered. She denies signs of infection. 8/27; patient presents for follow-up. We have been using silver alginate with antibiotic ointment under Urgo K2 lite. Wound is almost healed. We will order her compression Velcro garments today. Electronic Signature(s) Signed: 10/15/2022 4:43:58 PM By: Geralyn Corwin DO Entered By: Geralyn Corwin on 10/15/2022 14:51:28 -------------------------------------------------------------------------------- Physical Exam Details Patient Name: Date of Service: Leslie Lofty. 10/15/2022 1:45 PM Medical Record Number: [id number] Patient Account Number: [id number] Date of Birth/Sex: Treating RN: February 12, 1946 (77 y.o. F) Primary Care Provider: Sharmon Revere Other Clinician: Referring Provider: Treating Provider/Extender: Jenell Milliner, Himanshu Weeks in Treatment: 1 Constitutional respirations regular, non-labored and within target range for patient.. Cardiovascular 2+ dorsalis pedis/posterior tibialis pulses. Psychiatric pleasant and cooperative. Notes Left lower extremity: Open wound to the posterior distal leg with granulation tissue and dried lymph fluid. Lymphedema skin changes consistent with hyperkeratosis, hyperplasia and hyperpigmentation. 2+ pitting edema to the knee. No signs of surrounding infection including increased warmth, erythema or purulent drainage. Electronic Signature(s) Signed: 10/15/2022 4:43:58 PM By: Geralyn Corwin DO Entered By: Geralyn Corwin on 10/15/2022 14:56:34 -------------------------------------------------------------------------------- Physician Orders Details Patient Name: Date of Service: Leslie Lofty. 10/15/2022 1:45 PM Medical Record Number: [id number] Patient Account Number: [id number] Date of Birth/Sex: Treating RN: November 12, 1945 (77 y.o. Arta Silence Primary Care Provider: Sharmon Revere Other Clinician: Referring Provider: Treating Provider/Extender: Jenell Milliner, Himanshu Weeks in Treatment: 1 Verbal / Phone Orders: No Diagnosis Coding Follow-up Appointments ppointment in 1 week. - Dr. Mikey Bussing Room 0945 room 6 Thursday 10/24/2022 Return A ppointment in 2 weeks. - Dr. Mikey Bussing room 8 11/01/2022 0930 (already scheduled) Return A Anesthetic (In clinic) Topical Lidocaine 4% applied to wound bed [name], [name] ([id number]) [id number] Page 3 of 9 Bathing/ Shower/ Hygiene May shower with protection but do not get wound dressing(s) wet. Protect dressing(s) with water repellant cover (for example, large plastic bag) or a cast cover and may then take shower. Edema Control - Lymphedema / SCD / Other Elevate legs to the level of  the heart or above for 30 minutes daily and/or when sitting for 3-4 times a day throughout the day. Avoid standing for long periods of  time. Exercise regularly Compression stocking or Garment 30-40 mm/Hg pressure to: - [employer]- bilateral lower legs Juxtafites qty3 every 6 months for life both legs. Wound Treatment Wound #3 - Lower Leg Wound Laterality: Left, Medial, Posterior Cleanser: Soap and Water 1 x Per Week/30 Days Discharge Instructions: May shower and wash wound with dial antibacterial soap and water prior to dressing change. Cleanser: Wound Cleanser 1 x Per Week/30 Days Discharge Instructions: Cleanse the wound with wound cleanser prior to applying a clean dressing using gauze sponges, not tissue or cotton balls. Peri-Wound Care: Sween Lotion (Moisturizing lotion) 1 x Per Week/30 Days Discharge Instructions: Apply moisturizing lotion as directed Topical: Gentamicin 1 x Per Week/30 Days Discharge Instructions: As directed by physician Topical: Mupirocin Ointment 1 x Per Week/30 Days Discharge Instructions: Apply Mupirocin (Bactroban) as instructed Prim Dressing: Maxorb Extra Calcium Alginate, 2x2 (in/in) 1 x Per Week/30 Days ary Discharge Instructions: Apply to wound bed as instructed Secondary Dressing: ABD Pad, 8x10 1 x Per Week/30 Days Discharge Instructions: Apply over primary dressing as directed. Compression Wrap: Urgo K2 Lite, (equivalent to a 3 layer) two layer compression system, regular 1 x Per Week/30 Days Discharge Instructions: Apply Urgo K2 Lite as directed (alternative to 3 layer compression).****APPLY UNNA BOOT FIRST LAYER TO UPPER PORTION OF LOWER LEG and foot.**** Electronic Signature(s) Signed: 10/15/2022 4:43:58 PM By: Geralyn Corwin DO Entered By: Geralyn Corwin on 10/15/2022 14:56:48 -------------------------------------------------------------------------------- Problem List Details Patient Name: Date of Service: Leslie Lofty. 10/15/2022  1:45 PM Medical Record Number: [id number] Patient Account Number: [id number] Date of Birth/Sex: Treating RN: 07-04-1945 (77 y.o. 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 10/15/2022 1:45 PM Medical Record Number: [id number] Patient Account Number: [id number] Date of Birth/Sex: Treating RN: [birth year] (76 y.o. F) Primary Care Provider: Sharmon Revere Other Clinician: Referring Provider: Treating Provider/Extender: Jenell Milliner, Himanshu Weeks in Treatment: 1 Subjective Chief Complaint Information obtained from Patient 05/09/2022; history of wound to the right posterior leg in the setting of lymphedema 10/08/2022; left posterior leg wound History of Present Illness (HPI) 77 year old female presents with left leg wound present at least since May that has been getting worse, in July she was admitted for occlusion of left lower extremity venous stent that was reopened by venoplasty by Dr. Pascal Lux. Patient was on Coumadin for chronic DVT but switch to Eliquis since July. Patient was undergoing treatment for uterine  cancer but the chemotherapy is on hold until wound healing is assured. Patient has fair amount of discomfort in the left leg due to the wounds. She also states her left leg has been more swollen than usual although both legs are fairly big Patient is ABI in the clinic today is 1.1 on the left Patient is history of has history of IVC filter placement, DVT that was totally occlusive on the left that required stenting, that restenosed and required recent  management Patient is currently set up with home health with wound care 8/20; this is a patient with a complicated venous history. This apparently includes a chronic IVC filter and a stent in her left iliac vein. She was undergoing chemotherapy for uterine cancer but that is on hold until a fairly large but superficial area on her left anterior tibial area is healed. She is I think booked tomorrow for a thrombolyzes attempt of this stent in the left iliac vein. As such we should be able to wrap her today on her lower extremity. Also problematic is at home health came on Wednesday and only put kerlix on her 8/27; this is a patient who underwent a left common external iliac vein stent for what was thought to be a clot but turned out to be stagnant flow secondary to invasive endometrial cancer. She then underwent radiation therapy and was found to have an occlusive stent. She subsequently underwent mechanical thrombectomy with repeat stenting of the left common external leg veins. She has had an IVC iliac study which demonstrates patent common external iliac vein stents on the left. The greater saphenous vein and saphenofemoral junction does appear occluded. They will see her again in 4 to 6 months. The patient's wound on the left anterior and left lateral lower leg looks really quite healthy. Most of this is 100% epithelialized. She will need bilateral juxta lite stockings which we are going to go ahead and try to order for her today. We have discharged  home health who did not show up last week anyway 9/3; the patient came in with a left anterior tibial area wound is closed. She did not come with stockings. We ordered her juxta lite stockings last week and told her that the right leg would not be covered brackets no open wound] and depending on the insurance she may have some coverage for the left leg. Her insurance call they do not cover this she comes in with no stocking. 9/10-Patient returns to clinic for having the juxta lights put on, she does have the stockings with her today. Her wounds are healed 05/09/2022 [name] is a 77 year old female with a past medical history of left leg DVT endometrial cancer, lymphedema that presents to the clinic for a previous , wound to the posterior right leg. She states that an area had blistered up and opened Several weeks ago but has since healed. She does not wear compression stockings. She states they are too difficult to put on. I recommended stocking device to help with this however she states she has this and it also is not sufficient. Currently she denies signs of infection. 10/08/2022 [name] is a 77 year old female with a past medical history of lymphedema and venous insufficiency that presents to the clinic for a 3 to 4-week history of nonhealing wound to the posterior left leg. She does not wear compression stockings. She states these are too difficult to put on. She has been keeping the area covered. She denies signs of infection. 8/27; patient presents for follow-up. We have been using silver alginate with antibiotic ointment under Urgo K2 lite. Wound is almost healed. We will order her compression Velcro garments today. Patient History Information obtained from Patient. Family History Cancer - Maternal Grandparents, Diabetes - Siblings, No family history of Heart Disease, Hereditary Spherocytosis, Hypertension, Kidney Disease, Lung Disease, Seizures, Stroke, Thyroid  Problems, Tuberculosis. [name], [name] ([id number]) [id number] Page 5 of 9 Social History Former smoker - quit over 20  years ago, Marital Status - Single, Alcohol Use - Never, Drug Use - No History, Caffeine Use - Moderate - Coffee. Medical History Eyes Patient has history of Cataracts - removed Denies history of Glaucoma, Optic Neuritis Ear/Nose/Mouth/Throat Patient has history of Chronic sinus problems/congestion Denies history of Middle ear problems Hematologic/Lymphatic Patient has history of Lymphedema - lower legs Denies history of Anemia, Hemophilia, Human Immunodeficiency Virus, Sickle Cell Disease Respiratory Denies history of Aspiration, Asthma, Chronic Obstructive Pulmonary Disease (COPD), Pneumothorax, Tuberculosis Cardiovascular Patient has history of Deep Vein Thrombosis - left leg Denies history of Angina, Arrhythmia, Congestive Heart Failure, Coronary Artery Disease, Hypertension, Hypotension, Myocardial Infarction, Peripheral Arterial Disease, Peripheral Venous Disease, Phlebitis, Vasculitis Gastrointestinal Denies history of Cirrhosis , Colitis, Crohns, Hepatitis A, Hepatitis B, Hepatitis C Endocrine Denies history of Type I Diabetes, Type II Diabetes Genitourinary Denies history of End Stage Renal Disease Immunological Denies history of Lupus Erythematosus, Raynauds, Scleroderma Integumentary (Skin) Denies history of History of Burn Musculoskeletal Patient has history of Osteoarthritis Denies history of Gout, Rheumatoid Arthritis, Osteomyelitis Neurologic Denies history of Dementia, Neuropathy, Quadriplegia, Paraplegia, Seizure Disorder Oncologic Patient has history of Received Chemotherapy, Received Radiation Hospitalization/Surgery History - right port a cath place. - bilateral knee replacements. - hysterectomy. - cholecystectomy. Medical A Surgical History  Notes nd Hematologic/Lymphatic Pancytopenia Gastrointestinal GERD Musculoskeletal DJD left knee Oncologic Uterine Cancer Objective Constitutional respirations regular, non-labored and within target range for patient.. Vitals Time Taken: 2:04 PM, Height: 61 in, Weight: 248 lbs, BMI: 46.9, Temperature: 98.1 F, Pulse: 83 bpm, Respiratory Rate: 22 breaths/min, Blood Pressure: 131/64 mmHg. Cardiovascular 2+ dorsalis pedis/posterior tibialis pulses. Psychiatric pleasant and cooperative. General Notes: Left lower extremity: Open wound to the posterior distal leg with granulation tissue and dried lymph fluid. Lymphedema skin changes consistent with hyperkeratosis, hyperplasia and hyperpigmentation. 2+ pitting edema to the knee. No signs of surrounding infection including increased warmth, erythema or purulent drainage. Integumentary (Hair, Skin) Wound #3 status is Open. Original cause of wound was Gradually Appeared. The date acquired was: 09/17/2022. The wound has been in treatment 1 weeks. The wound is located on the Left,Medial,Posterior Lower Leg. The wound measures 1.5cm length x 2cm width x 0.1cm depth; 2.356cm^2 area and 0.236cm^3 volume. There is no tunneling or undermining noted. There is a none present amount of drainage noted. The wound margin is distinct with the outline attached to the wound base. There is no granulation within the wound bed. There is no necrotic tissue within the wound bed. The periwound skin appearance exhibited: Dry/Scaly. The periwound skin appearance did not exhibit: Callus, Crepitus, Excoriation, Induration, Rash, Scarring, Maceration, Atrophie Blanche, Cyanosis, Ecchymosis, Hemosiderin Staining, Mottled, Pallor, Rubor, Erythema. Periwound temperature was noted as No Abnormality. [name], [name] ([id number]) [id number] Page 6 of 9 Assessment Active Problems ICD-10 Lymphedema, not elsewhere classified Chronic venous hypertension  (idiopathic) with ulcer of left lower extremity Non-pressure chronic ulcer of other part of left lower leg with fat layer exposed Patient's wound is almost healed. I recommended continuing the course with alginate and antibiotic ointment under 3 layer equivalent compression wrap. We will go ahead and order her Velcro compression garments to use once her wound heals. Follow-up in 1 week. Procedures Wound #3 Pre-procedure diagnosis of Wound #3 is a Lymphedema located on the Left,Medial,Posterior Lower Leg . There was a Double Layer Compression Therapy Procedure by Shawn Stall, RN. Post procedure Diagnosis Wound #3: Same as Pre-Procedure Plan Follow-up Appointments: Return Appointment in 1 week. - Dr. Mikey Bussing Room 0945 room 6 Thursday 10/24/2022  Return Appointment in 2 weeks. - Dr. Mikey Bussing room 8 11/01/2022 0930 (already scheduled) Anesthetic: (In clinic) Topical Lidocaine 4% applied to wound bed Bathing/ Shower/ Hygiene: May shower with protection but do not get wound dressing(s) wet. Protect dressing(s) with water repellant cover (for example, large plastic bag) or a cast cover and may then take shower. Edema Control - Lymphedema / SCD / Other: Elevate legs to the level of the heart or above for 30 minutes daily and/or when sitting for 3-4 times a day throughout the day. Avoid standing for long periods of time. Exercise regularly Compression stocking or Garment 30-40 mm/Hg pressure to: - [employer]- bilateral lower legs Juxtafites qty3 every 6 months for life both legs. WOUND #3: - Lower Leg Wound Laterality: Left, Medial, Posterior Cleanser: Soap and Water 1 x Per Week/30 Days Discharge Instructions: May shower and wash wound with dial antibacterial soap and water prior to dressing change. Cleanser: Wound Cleanser 1 x Per Week/30 Days Discharge Instructions: Cleanse the wound with wound cleanser prior to applying a clean dressing using gauze sponges, not tissue or cotton  balls. Peri-Wound Care: Sween Lotion (Moisturizing lotion) 1 x Per Week/30 Days Discharge Instructions: Apply moisturizing lotion as directed Topical: Gentamicin 1 x Per Week/30 Days Discharge Instructions: As directed by physician Topical: Mupirocin Ointment 1 x Per Week/30 Days Discharge Instructions: Apply Mupirocin (Bactroban) as instructed Prim Dressing: Maxorb Extra Calcium Alginate, 2x2 (in/in) 1 x Per Week/30 Days ary Discharge Instructions: Apply to wound bed as instructed Secondary Dressing: ABD Pad, 8x10 1 x Per Week/30 Days Discharge Instructions: Apply over primary dressing as directed. Com pression Wrap: Urgo K2 Lite, (equivalent to a 3 layer) two layer compression system, regular 1 x Per Week/30 Days Discharge Instructions: Apply Urgo K2 Lite as directed (alternative to 3 layer compression).****APPLY UNNA BOOT FIRST LAYER TO UPPER PORTION OF LOWER LEG and foot.**** 1. Alginate with antibiotic ointment under 3 layer equivalent compression wrap to the left lower extremity 2. Follow-up in 1 week 3. Order Velcro compression garments Electronic Signature(s) Signed: 10/15/2022 4:43:58 PM By: Geralyn Corwin DO Entered By: Geralyn Corwin on 10/15/2022 14:58:15 HxROS Details -------------------------------------------------------------------------------- [name] ([id number]) [id number] Page 7 of 9 Patient Name: Date of Service: [name], [name] 10/15/2022 1:45 PM Medical Record Number: [id number] Patient Account Number: [id number] Date of Birth/Sex: Treating RN: 03-25-1945 (77 y.o. F) Primary Care Provider: Sharmon Revere Other Clinician: Referring Provider: Treating Provider/Extender: Jenell Milliner, Himanshu Weeks in Treatment: 1 Information Obtained From Patient Eyes Medical History: Positive for: Cataracts - removed Negative for: Glaucoma; Optic Neuritis Ear/Nose/Mouth/Throat Medical History: Positive for: Chronic  sinus problems/congestion Negative for: Middle ear problems Hematologic/Lymphatic Medical History: Positive for: Lymphedema - lower legs Negative for: Anemia; Hemophilia; Human Immunodeficiency Virus; Sickle Cell Disease Past Medical History Notes: Pancytopenia Respiratory Medical History: Negative for: Aspiration; Asthma; Chronic Obstructive Pulmonary Disease (COPD); Pneumothorax; Tuberculosis Cardiovascular Medical History: Positive for: Deep Vein Thrombosis - left leg Negative for: Angina; Arrhythmia; Congestive Heart Failure; Coronary Artery Disease; Hypertension; Hypotension; Myocardial Infarction; Peripheral Arterial Disease; Peripheral Venous Disease; Phlebitis; Vasculitis Gastrointestinal Medical History: Negative for: Cirrhosis ; Colitis; Crohns; Hepatitis A; Hepatitis B; Hepatitis C Past Medical History Notes: GERD Endocrine Medical History: Negative for: Type I Diabetes; Type II Diabetes Genitourinary Medical History: Negative for: End Stage Renal Disease Immunological Medical History: Negative for: Lupus Erythematosus; Raynauds; Scleroderma Integumentary (Skin) Medical History: Negative for: History of Burn Musculoskeletal Medical History: Positive for: Osteoarthritis Negative for: Gout; Rheumatoid Arthritis; Osteomyelitis Past Medical  History Notes: DJD left knee Neurologic Medical HistoryANELIA, [name] ([id number]) [id number] Page 8 of 9 Negative for: Dementia; Neuropathy; Quadriplegia; Paraplegia; Seizure Disorder Oncologic Medical History: Positive for: Received Chemotherapy; Received Radiation Past Medical History Notes: Uterine Cancer HBO Extended History Items Ear/Nose/Mouth/Throat: Eyes: Chronic sinus Cataracts problems/congestion Immunizations Pneumococcal Vaccine: Received Pneumococcal Vaccination: Yes Received Pneumococcal Vaccination On or After 60th Birthday: Yes Implantable Devices Yes Hospitalization  / Surgery History Type of Hospitalization/Surgery right port a cath place bilateral knee replacements hysterectomy cholecystectomy Family and Social History Cancer: Yes - Maternal Grandparents; Diabetes: Yes - Siblings; Heart Disease: No; Hereditary Spherocytosis: No; Hypertension: No; Kidney Disease: No; Lung Disease: No; Seizures: No; Stroke: No; Thyroid Problems: No; Tuberculosis: No; Former smoker - quit over 20 years ago; Marital Status - Single; Alcohol Use: Never; Drug Use: No History; Caffeine Use: Moderate - Coffee; Financial Concerns: No; Food, Clothing or Shelter Needs: No; Support System Lacking: No; Transportation Concerns: No Electronic Signature(s) Signed: 10/15/2022 4:43:58 PM By: Geralyn Corwin DO Entered By: Geralyn Corwin on 10/15/2022 14:51:43 -------------------------------------------------------------------------------- SuperBill Details Patient Name: Date of Service: Leslie Lofty. 10/15/2022 Medical Record Number: [id number] Patient Account Number: [id number] Date of Birth/Sex: Treating RN: [birth year] (77 y.o. 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 Facility Procedures : CPT4 Code: [id number] Description: (Facility Use Only) [phone] - APPLY MULTLAY COMPRS LWR LT LEG Modifier: Quantity: 1 Physician Procedures : CPT4 Code Description Modifier [phone] [id number] - WC PHYS LEVEL 3 - EST PT OLER, TUSZYNSKI ([id number]) [id number] ICD-10 Diagnosis Description 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

## 2022-10-16 NOTE — Progress Notes (Signed)
[name], [name] ([id number]) [id number] Page 1 of 8 Visit Report for 10/15/2022 Arrival Information Details Patient Name: Date of Service: [name], [name] 10/15/2022 1:45 PM Medical Record Number: [id number] Patient Account Number: [id number] Date of Birth/Sex: Treating RN: Nov 11, 1945 (77 y.o. Arta Silence Primary Care Konor Noren: Sharmon Revere Other Clinician: Referring Ramiz Turpin: Treating Marquies Wanat/Extender: Jenell Milliner, Himanshu Weeks in Treatment: 1 Visit Information History Since Last Visit Added or deleted any medications: No Patient Arrived: Walker Any new allergies or adverse reactions: No Arrival Time: 14:00 Had a fall or experienced change in No Accompanied By: self activities of daily living that may affect Transfer Assistance: None risk of falls: Patient Identification Verified: Yes Signs or symptoms of abuse/neglect since last visito No Secondary Verification Process Completed: Yes Hospitalized since last visit: No Patient Requires Transmission-Based Precautions: No Implantable device outside of the clinic excluding No Patient Has Alerts: Yes cellular tissue based products placed in the center Patient Alerts: Patient on Blood Thinner since last visit: Has Dressing in Place as Prescribed: Yes Has Compression in Place as Prescribed: Yes Pain Present Now: No Electronic Signature(s) Signed: 10/15/2022 5:33:37 PM By: Shawn Stall RN, BSN Entered By: Shawn Stall on 10/15/2022 14:03:57 -------------------------------------------------------------------------------- Compression Therapy Details Patient Name: Date of Service: [name]. 10/15/2022 1:45 PM Medical Record Number: [id number] Patient Account Number: [id number] Date of Birth/Sex: Treating RN: February 03, 1946 (77 y.o. Arta Silence Primary Care Cap Massi: Sharmon Revere Other Clinician: Referring Alison Kubicki: Treating Arrion Broaddus/Extender: Jenell Milliner,  Himanshu Weeks in Treatment: 1 Compression Therapy Performed for Wound Assessment: Wound #3 Left,Medial,Posterior Lower Leg Performed By: Clinician Shawn Stall, RN Compression Type: Double Layer Post Procedure Diagnosis Same as Pre-procedure Electronic Signature(s) Signed: 10/15/2022 5:33:37 PM By: Shawn Stall RN, BSN Entered By: Shawn Stall on 10/15/2022 14:13:05 [name] ([id number]) [id number] Page 2 of 8 -------------------------------------------------------------------------------- Encounter Discharge Information Details Patient Name: Date of Service: [name], [name] 10/15/2022 1:45 PM Medical Record Number: [id number] Patient Account Number: [id number] Date of Birth/Sex: Treating RN: 26-Jun-1945 (77 y.o. Arta Silence Primary Care Murdis Flitton: Sharmon Revere Other Clinician: Referring Cruzito Standre: Treating Mayleen Borrero/Extender: Jenell Milliner, Himanshu Weeks in Treatment: 1 Encounter Discharge Information Items Discharge Condition: Stable Ambulatory Status: Walker Discharge Destination: Home Transportation: Private Auto Accompanied By: self Schedule Follow-up Appointment: Yes Clinical Summary of Care: Electronic Signature(s) Signed: 10/15/2022 5:33:37 PM By: Shawn Stall RN, BSN Entered By: Shawn Stall on 10/15/2022 14:24:45 -------------------------------------------------------------------------------- Lower Extremity Assessment Details Patient Name: Date of Service: [name]. 10/15/2022 1:45 PM Medical Record Number: [id number] Patient Account Number: [id number] Date of Birth/Sex: Treating RN: [birth year] (77 y.o. Debara Pickett, Millard.Loa Primary Care Amberly Livas: Sharmon Revere Other Clinician: Referring Tykisha Areola: Treating Jairy Angulo/Extender: Jenell Milliner, Himanshu Weeks in Treatment: 1 Edema Assessment Assessed: [Left: Yes] [Right: Yes] Edema: [Left: Yes] [Right: Yes] Calf Left: Right: Point of  Measurement: 35 cm From Medial Instep 52 cm 56 cm Ankle Left: Right: Point of Measurement: 13 cm From Medial Instep 32 cm 34 cm Knee To Floor Left: Right: From Medial Instep 35 cm 35 cm Vascular Assessment Pulses: Dorsalis Pedis Palpable: [Left:Yes] [Right:Yes] Extremity colors, hair growth, and conditions: Extremity Color: [Left:Hyperpigmented] [Right:Hyperpigmented] Hair Growth on Extremity: [Left:No] [Right:No] Temperature of Extremity: [Left:Warm] Capillary Refill: [Left:< 3 seconds] [Right:< 3 seconds] Dependent Rubor: [Left:No] [Right:No] Blanched when Elevated: [Left:No Yes] [Right:No Yes] Toe Nail Assessment Left: RightOVEDA, [name] ([id number]) [id number] Page 3 of 8 Thick: Yes No Discolored: Yes No Deformed: Yes No Improper Length and  Hygiene: Yes No Electronic Signature(s) Signed: 10/15/2022 5:33:37 PM By: Shawn Stall RN, BSN Entered By: Shawn Stall on 10/15/2022 14:12:39 -------------------------------------------------------------------------------- Multi Wound Chart Details Patient Name: Date of Service: [name]. 10/15/2022 1:45 PM Medical Record Number: [id number] Patient Account Number: [id number] Date of Birth/Sex: Treating RN: 03/25/1945 (77 y.o. F) Primary Care Karolyne Timmons: Sharmon Revere Other Clinician: Referring Giomar Gusler: Treating Aliveah Gallant/Extender: Jenell Milliner, Himanshu Weeks in Treatment: 1 Vital Signs Height(in): 61 Pulse(bpm): 83 Weight(lbs): 248 Blood Pressure(mmHg): 131/64 Body Mass Index(BMI): 46.9 Temperature(F): 98.1 Respiratory Rate(breaths/min): 22 [3:Photos:] [N/A:N/A] Left, Medial, Posterior Lower Leg N/A N/A Wound Location: Gradually Appeared N/A N/A Wounding Event: Lymphedema N/A N/A Primary Etiology: Cataracts, Chronic sinus N/A N/A Comorbid History: problems/congestion, Lymphedema, Deep Vein Thrombosis, Osteoarthritis, Received Chemotherapy,  Received Radiation 09/17/2022 N/A N/A Date Acquired: 1 N/A N/A Weeks of Treatment: Open N/A N/A Wound Status: No N/A N/A Wound Recurrence: 1.5x2x0.1 N/A N/A Measurements L x W x D (cm) 2.356 N/A N/A A (cm) : rea 0.236 N/A N/A Volume (cm) : 75.00% N/A N/A % Reduction in A rea: 74.90% N/A N/A % Reduction in Volume: Partial Thickness N/A N/A Classification: None Present N/A N/A Exudate A mount: Distinct, outline attached N/A N/A Wound Margin: None Present (0%) N/A N/A Granulation A mount: None Present (0%) N/A N/A Necrotic A mount: Fascia: No N/A N/A Exposed Structures: Fat Layer (Subcutaneous Tissue): No Tendon: No Muscle: No Joint: No Bone: No Large (67-100%) N/A N/A Epithelialization: Excoriation: No N/A N/A Periwound Skin Texture: Induration: No Callus: No Crepitus: No Rash: No [name], [name] ([id number]) [id number] Page 4 of 8 Scarring: No Dry/Scaly: Yes N/A N/A Periwound Skin Moisture: Maceration: No Atrophie Blanche: No N/A N/A Periwound Skin Color: Cyanosis: No Ecchymosis: No Erythema: No Hemosiderin Staining: No Mottled: No Pallor: No Rubor: No No Abnormality N/A N/A Temperature: Compression Therapy N/A N/A Procedures Performed: Treatment Notes Wound #3 (Lower Leg) Wound Laterality: Left, Medial, Posterior Cleanser Soap and Water Discharge Instruction: May shower and wash wound with dial antibacterial soap and water prior to dressing change. Wound Cleanser Discharge Instruction: Cleanse the wound with wound cleanser prior to applying a clean dressing using gauze sponges, not tissue or cotton balls. Peri-Wound Care Sween Lotion (Moisturizing lotion) Discharge Instruction: Apply moisturizing lotion as directed Topical Gentamicin Discharge Instruction: As directed by physician Mupirocin Ointment Discharge Instruction: Apply Mupirocin (Bactroban) as instructed Primary Dressing Maxorb Extra Calcium Alginate,  2x2 (in/in) Discharge Instruction: Apply to wound bed as instructed Secondary Dressing ABD Pad, 8x10 Discharge Instruction: Apply over primary dressing as directed. Secured With Compression Wrap Urgo K2 Lite, (equivalent to a 3 layer) two layer compression system, regular Discharge Instruction: Apply Urgo K2 Lite as directed (alternative to 3 layer compression).****APPLY UNNA BOOT FIRST LAYER TO UPPER PORTION OF LOWER LEG and foot.**** Compression Stockings Add-Ons Electronic Signature(s) Signed: 10/15/2022 4:43:58 PM By: Geralyn Corwin DO Entered By: Geralyn Corwin on 10/15/2022 14:33:06 -------------------------------------------------------------------------------- Multi-Disciplinary Care Plan Details Patient Name: Date of Service: [name]. 10/15/2022 1:45 PM Medical Record Number: [id number] Patient Account Number: [id number] Date of Birth/Sex: Treating RN: 1945-02-26 (77 y.o. Arta Silence Primary Care Thaxton Pelley: Sharmon Revere Other Clinician: Referring Blas Riches: Treating Infant Doane/Extender: Jenell Milliner, Himanshu Weeks in Treatment: 1 Reitz, Daniel Nones ([id number]) [id number] Page 5 of 8 Active Inactive Nutrition Nursing Diagnoses: Potential for alteratiion in Nutrition/Potential for imbalanced nutrition Goals: Patient/caregiver agrees to and verbalizes understanding of need to use nutritional supplements and/or vitamins as prescribed Date Initiated: 10/08/2022 Target Resolution Date: 10/25/2022  Goal Status: Active Interventions: Provide education on nutrition Treatment Activities: Patient referred to Primary Care Physician for further nutritional evaluation : 10/08/2022 Notes: Pain, Acute or Chronic Nursing Diagnoses: Pain, acute or chronic: actual or potential Potential alteration in comfort, pain Goals: Patient will verbalize adequate pain control and receive pain control interventions during procedures as  needed Date Initiated: 10/08/2022 Target Resolution Date: 10/24/2022 Goal Status: Active Patient/caregiver will verbalize comfort level met Date Initiated: 10/08/2022 Target Resolution Date: 10/25/2022 Goal Status: Active Interventions: Encourage patient to take pain medications as prescribed Provide education on pain management Reposition patient for comfort Treatment Activities: Administer pain control measures as ordered : 10/08/2022 Notes: Electronic Signature(s) Signed: 10/15/2022 5:33:37 PM By: Shawn Stall RN, BSN Entered By: Shawn Stall on 10/15/2022 14:15:39 -------------------------------------------------------------------------------- Pain Assessment Details Patient Name: Date of Service: [name]. 10/15/2022 1:45 PM Medical Record Number: [id number] Patient Account Number: [id number] Date of Birth/Sex: Treating RN: 02-10-1946 (77 y.o. Arta Silence Primary Care Aryani Daffern: Sharmon Revere Other Clinician: Referring Brixon Zhen: Treating Samone Guhl/Extender: Jenell Milliner, Himanshu Weeks in Treatment: 1 Active Problems Location of Pain Severity and Description of Pain Patient Has Paino No Site Locations [name], [name] ([id number]) [id number] Page 6 of 8 Pain Management and Medication Current Pain Management: Electronic Signature(s) Signed: 10/15/2022 5:33:37 PM By: Shawn Stall RN, BSN Entered By: Shawn Stall on 10/15/2022 14:05:20 -------------------------------------------------------------------------------- Patient/Caregiver Education Details Patient Name: Date of Service: [name] 8/27/2024andnbsp1:45 PM Medical Record Number: [id number] Patient Account Number: [id number] Date of Birth/Gender: Treating RN: [birth year] (77 y.o. Arta Silence Primary Care Physician: Sharmon Revere Other Clinician: Referring Physician: Treating Physician/Extender: Jenell Milliner, Himanshu Weeks in Treatment:  1 Education Assessment Education Provided To: Patient Education Topics Provided Wound/Skin Impairment: Handouts: Caring for Your Ulcer Methods: Explain/Verbal Responses: Reinforcements needed Electronic Signature(s) Signed: 10/15/2022 5:33:37 PM By: Shawn Stall RN, BSN Entered By: Shawn Stall on 10/15/2022 14:15:58 -------------------------------------------------------------------------------- Wound Assessment Details Patient Name: Date of Service: [name]. 10/15/2022 1:45 PM Medical Record Number: [id number] Patient Account Number: [id number] Date of Birth/Sex: Treating RN: August 04, 1945 (77 y.o. [name] Primary Care Chyler Creely: Sharmon Revere Other Clinician: ILANI, [name] ([id number]) [id number] Page 7 of 8 Referring Annarae Macnair: Treating Marion Seese/Extender: Jenell Milliner, Himanshu Weeks in Treatment: 1 Wound Status Wound Number: 3 Primary Lymphedema Etiology: Wound Location: Left, Medial, Posterior Lower Leg Wound Open Wounding Event: Gradually Appeared Status: Date Acquired: 09/17/2022 Comorbid Cataracts, Chronic sinus problems/congestion, Lymphedema, Deep Weeks Of Treatment: 1 History: Vein Thrombosis, Osteoarthritis, Received Chemotherapy, Received Clustered Wound: No Radiation Photos Wound Measurements Length: (cm) 1.5 Width: (cm) 2 Depth: (cm) 0.1 Area: (cm) 2.356 Volume: (cm) 0.236 % Reduction in Area: 75% % Reduction in Volume: 74.9% Epithelialization: Large (67-100%) Tunneling: No Undermining: No Wound Description Classification: Partial Thickness Wound Margin: Distinct, outline attached Exudate Amount: None Present Foul Odor After Cleansing: No Slough/Fibrino No Wound Bed Granulation Amount: None Present (0%) Exposed Structure Necrotic Amount: None Present (0%) Fascia Exposed: No Fat Layer (Subcutaneous Tissue) Exposed: No Tendon Exposed: No Muscle Exposed: No Joint Exposed: No Bone Exposed:  No Periwound Skin Texture Texture Color No Abnormalities Noted: No No Abnormalities Noted: No Callus: No Atrophie Blanche: No Crepitus: No Cyanosis: No Excoriation: No Ecchymosis: No Induration: No Erythema: No Rash: No Hemosiderin Staining: No Scarring: No Mottled: No Pallor: No Moisture Rubor: No No Abnormalities Noted: No Dry / Scaly: Yes Temperature / Pain Maceration: No Temperature: No Abnormality Treatment Notes Wound #3 (Lower Leg) Wound Laterality: Left, Medial,  Posterior Cleanser Soap and Water Discharge Instruction: May shower and wash wound with dial antibacterial soap and water prior to dressing change. Wound Cleanser Discharge Instruction: Cleanse the wound with wound cleanser prior to applying a clean dressing using gauze sponges, not tissue or cotton balls. Peri-Wound Care Sween Lotion (Moisturizing lotion) [name], [name] ([id number]) [id number] Page 8 of 8 Discharge Instruction: Apply moisturizing lotion as directed Topical Gentamicin Discharge Instruction: As directed by physician Mupirocin Ointment Discharge Instruction: Apply Mupirocin (Bactroban) as instructed Primary Dressing Maxorb Extra Calcium Alginate, 2x2 (in/in) Discharge Instruction: Apply to wound bed as instructed Secondary Dressing ABD Pad, 8x10 Discharge Instruction: Apply over primary dressing as directed. Secured With Compression Wrap Urgo K2 Lite, (equivalent to a 3 layer) two layer compression system, regular Discharge Instruction: Apply Urgo K2 Lite as directed (alternative to 3 layer compression).****APPLY UNNA BOOT FIRST LAYER TO UPPER PORTION OF LOWER LEG and foot.**** Compression Stockings Add-Ons Electronic Signature(s) Signed: 10/15/2022 4:35:22 PM By: Redmond Pulling RN, BSN Entered By: Redmond Pulling on 10/15/2022 14:06:20 -------------------------------------------------------------------------------- Vitals Details Patient Name: Date of  Service: [name]. 10/15/2022 1:45 PM Medical Record Number: [id number] Patient Account Number: [id number] Date of Birth/Sex: Treating RN: 12/25/1945 (77 y.o. Debara Pickett, Millard.Loa Primary Care Rilei Kravitz: Sharmon Revere Other Clinician: Referring Laia Wiley: Treating Juvencio Verdi/Extender: Jenell Milliner, Himanshu Weeks in Treatment: 1 Vital Signs Time Taken: 14:04 Temperature (F): 98.1 Height (in): 61 Pulse (bpm): 83 Weight (lbs): 248 Respiratory Rate (breaths/min): 22 Body Mass Index (BMI): 46.9 Blood Pressure (mmHg): 131/64 Reference Range: 80 - 120 mg / dl Electronic Signature(s) Signed: 10/15/2022 5:33:37 PM By: Shawn Stall RN, BSN Entered By: Shawn Stall on 10/15/2022 14:04:19

## 2022-10-24 ENCOUNTER — Encounter (HOSPITAL_BASED_OUTPATIENT_CLINIC_OR_DEPARTMENT_OTHER): Payer: 59 | Attending: Internal Medicine | Admitting: Internal Medicine

## 2022-10-24 DIAGNOSIS — I87312 Chronic venous hypertension (idiopathic) with ulcer of left lower extremity: Secondary | ICD-10-CM | POA: Insufficient documentation

## 2022-10-24 DIAGNOSIS — I890 Lymphedema, not elsewhere classified: Secondary | ICD-10-CM | POA: Insufficient documentation

## 2022-10-24 DIAGNOSIS — L97822 Non-pressure chronic ulcer of other part of left lower leg with fat layer exposed: Secondary | ICD-10-CM | POA: Insufficient documentation

## 2022-10-24 DIAGNOSIS — I872 Venous insufficiency (chronic) (peripheral): Secondary | ICD-10-CM | POA: Diagnosis not present

## 2022-10-24 DIAGNOSIS — Z9049 Acquired absence of other specified parts of digestive tract: Secondary | ICD-10-CM | POA: Insufficient documentation

## 2022-10-24 DIAGNOSIS — Z90710 Acquired absence of both cervix and uterus: Secondary | ICD-10-CM | POA: Diagnosis not present

## 2022-10-24 DIAGNOSIS — Z86718 Personal history of other venous thrombosis and embolism: Secondary | ICD-10-CM | POA: Insufficient documentation

## 2022-10-24 DIAGNOSIS — Z87891 Personal history of nicotine dependence: Secondary | ICD-10-CM | POA: Insufficient documentation

## 2022-10-24 DIAGNOSIS — Z96653 Presence of artificial knee joint, bilateral: Secondary | ICD-10-CM | POA: Insufficient documentation

## 2022-10-24 DIAGNOSIS — Z8542 Personal history of malignant neoplasm of other parts of uterus: Secondary | ICD-10-CM | POA: Insufficient documentation

## 2022-10-24 DIAGNOSIS — M1990 Unspecified osteoarthritis, unspecified site: Secondary | ICD-10-CM | POA: Insufficient documentation

## 2022-10-24 DIAGNOSIS — Z7901 Long term (current) use of anticoagulants: Secondary | ICD-10-CM | POA: Diagnosis not present

## 2022-10-25 ENCOUNTER — Inpatient Hospital Stay: Payer: 59 | Attending: Hematology and Oncology

## 2022-10-25 ENCOUNTER — Ambulatory Visit: Payer: 59 | Admitting: Hematology and Oncology

## 2022-10-25 ENCOUNTER — Inpatient Hospital Stay: Payer: 59

## 2022-10-25 ENCOUNTER — Ambulatory Visit (HOSPITAL_COMMUNITY)
Admission: RE | Admit: 2022-10-25 | Discharge: 2022-10-25 | Disposition: A | Discharge status: Home / Self Care | Payer: 59 | Source: Ambulatory Visit | Attending: Hematology and Oncology

## 2022-10-25 DIAGNOSIS — C55 Malignant neoplasm of uterus, part unspecified: Secondary | ICD-10-CM | POA: Diagnosis present

## 2022-10-25 DIAGNOSIS — K449 Diaphragmatic hernia without obstruction or gangrene: Secondary | ICD-10-CM | POA: Insufficient documentation

## 2022-10-25 DIAGNOSIS — I708 Atherosclerosis of other arteries: Secondary | ICD-10-CM | POA: Insufficient documentation

## 2022-10-25 DIAGNOSIS — Z86718 Personal history of other venous thrombosis and embolism: Secondary | ICD-10-CM | POA: Insufficient documentation

## 2022-10-25 DIAGNOSIS — Z8542 Personal history of malignant neoplasm of other parts of uterus: Secondary | ICD-10-CM | POA: Insufficient documentation

## 2022-10-25 DIAGNOSIS — I700 Atherosclerosis of aorta: Secondary | ICD-10-CM | POA: Insufficient documentation

## 2022-10-25 DIAGNOSIS — Z7901 Long term (current) use of anticoagulants: Secondary | ICD-10-CM | POA: Insufficient documentation

## 2022-10-25 DIAGNOSIS — K5730 Diverticulosis of large intestine without perforation or abscess without bleeding: Secondary | ICD-10-CM | POA: Insufficient documentation

## 2022-10-25 DIAGNOSIS — Z79899 Other long term (current) drug therapy: Secondary | ICD-10-CM | POA: Insufficient documentation

## 2022-10-25 DIAGNOSIS — R609 Edema, unspecified: Secondary | ICD-10-CM | POA: Insufficient documentation

## 2022-10-25 DIAGNOSIS — D61818 Other pancytopenia: Secondary | ICD-10-CM | POA: Insufficient documentation

## 2022-10-25 DIAGNOSIS — N329 Bladder disorder, unspecified: Secondary | ICD-10-CM | POA: Insufficient documentation

## 2022-10-25 DIAGNOSIS — G893 Neoplasm related pain (acute) (chronic): Secondary | ICD-10-CM | POA: Insufficient documentation

## 2022-10-25 DIAGNOSIS — K429 Umbilical hernia without obstruction or gangrene: Secondary | ICD-10-CM | POA: Insufficient documentation

## 2022-10-25 MED ORDER — HEPARIN SOD (PORK) LOCK FLUSH 100 UNIT/ML IV SOLN
500.0000 [IU] | Freq: Once | INTRAVENOUS | Status: AC
  Administered 2022-10-25: 500 [IU] via INTRAVENOUS

## 2022-10-25 MED ORDER — IOHEXOL 300 MG/ML  SOLN
100.0000 mL | Freq: Once | INTRAMUSCULAR | Status: AC | PRN
  Administered 2022-10-25: 100 mL via INTRAVENOUS

## 2022-10-25 MED ORDER — SODIUM CHLORIDE (PF) 0.9 % IJ SOLN
INTRAMUSCULAR | Status: AC
  Filled 2022-10-25: qty 50

## 2022-10-25 MED ORDER — SODIUM CHLORIDE 0.9% FLUSH
10.0000 mL | Freq: Once | INTRAVENOUS | Status: AC
  Administered 2022-10-25: 10 mL

## 2022-10-25 NOTE — Progress Notes (Signed)
HILDY, FARIES ([id number]) [id number] Page 1 of 8 Visit Report for 10/24/2022 Arrival Information Details Patient Name: Date of Service: [name], [name]. 10/24/2022 9:45 A M Medical Record Number: [id number] Patient Account Number: [id number] Date of Birth/Sex: Treating RN: 06/22/1945 (77 y.o. Orville Govern Primary Care Kabir Brannock: Sharmon Revere Other Clinician: Referring Myrl Lazarus: Treating Arabella Revelle/Extender: Jenell Milliner, Himanshu Weeks in Treatment: 2 Visit Information History Since Last Visit Added or deleted any medications: No Patient Arrived: Walker Any new allergies or adverse reactions: No Arrival Time: 10:20 Had a fall or experienced change in No Accompanied By: self activities of daily living that may affect Transfer Assistance: None risk of falls: Patient Identification Verified: Yes Signs or symptoms of abuse/neglect since last visito No Secondary Verification Process Completed: Yes Hospitalized since last visit: No Patient Requires Transmission-Based Precautions: No Implantable device outside of the clinic excluding No Patient Has Alerts: Yes cellular tissue based products placed in the center Patient Alerts: Patient on Blood Thinner since last visit: Has Dressing in Place as Prescribed: Yes Has Compression in Place as Prescribed: Yes Pain Present Now: Yes Electronic Signature(s) Signed: 10/24/2022 3:54:16 PM By: Redmond Pulling RN, BSN Entered By: Redmond Pulling on 10/24/2022 07:21:02 -------------------------------------------------------------------------------- Clinic Level of Care Assessment Details Patient Name: Date of Service: [name]. 10/24/2022 9:45 A M Medical Record Number: [id number] Patient Account Number: [id number] Date of Birth/Sex: Treating RN: [birth year] (77 y.o. Orville Govern Primary Care Danay Mckellar: Sharmon Revere Other Clinician: Referring Anh Mangano: Treating Retina Bernardy/Extender: Jenell Milliner, Himanshu Weeks in Treatment: 2 Clinic Level of Care Assessment Items TOOL 4 Quantity Score X- 1 0 Use when only an EandM is performed on FOLLOW-UP visit ASSESSMENTS - Nursing Assessment / Reassessment X- 1 10 Reassessment of Co-morbidities (includes updates in patient status) X- 1 5 Reassessment of Adherence to Treatment Plan ASSESSMENTS - Wound and Skin A ssessment / Reassessment X - Simple Wound Assessment / Reassessment - one wound 1 5 []  - 0 Complex Wound Assessment / Reassessment - multiple wounds []  - 0 Dermatologic / Skin Assessment (not related to wound area) ASSESSMENTS - Focused Assessment X- 1 5 Circumferential Edema Measurements - multi extremities []  - 0 Nutritional Assessment / Counseling / Intervention [name], [name] ([id number]) [id number] Page 2 of 8 []  - 0 Lower Extremity Assessment (monofilament, tuning fork, pulses) []  - 0 Peripheral Arterial Disease Assessment (using hand held doppler) ASSESSMENTS - Ostomy and/or Continence Assessment and Care []  - 0 Incontinence Assessment and Management []  - 0 Ostomy Care Assessment and Management (repouching, etc.) PROCESS - Coordination of Care []  - 0 Simple Patient / Family Education for ongoing care []  - 0 Complex (extensive) Patient / Family Education for ongoing care X- 1 10 Staff obtains Chiropractor, Records, T Results / Process Orders est []  - 0 Staff telephones HHA, Nursing Homes / Clarify orders / etc []  - 0 Routine Transfer to another Facility (non-emergent condition) []  - 0 Routine Hospital Admission (non-emergent condition) []  - 0 New Admissions / Manufacturing engineer / Ordering NPWT Apligraf, etc. , []  - 0 Emergency Hospital Admission (emergent condition) X- 1 10 Simple Discharge Coordination []  - 0 Complex (extensive) Discharge Coordination PROCESS - Special Needs []  - 0 Pediatric / Minor Patient Management []  - 0 Isolation Patient  Management []  - 0 Hearing / Language / Visual special needs []  - 0 Assessment of Community assistance (transportation, D/C planning, etc.) []  - 0 Additional assistance / Altered mentation []  - 0 Support Surface(s) Assessment (bed,  cushion, seat, etc.) INTERVENTIONS - Wound Cleansing / Measurement X - Simple Wound Cleansing - one wound 1 5 []  - 0 Complex Wound Cleansing - multiple wounds X- 1 5 Wound Imaging (photographs - any number of wounds) []  - 0 Wound Tracing (instead of photographs) X- 1 5 Simple Wound Measurement - one wound []  - 0 Complex Wound Measurement - multiple wounds INTERVENTIONS - Wound Dressings []  - 0 Small Wound Dressing one or multiple wounds []  - 0 Medium Wound Dressing one or multiple wounds []  - 0 Large Wound Dressing one or multiple wounds []  - 0 Application of Medications - topical []  - 0 Application of Medications - injection INTERVENTIONS - Miscellaneous []  - 0 External ear exam []  - 0 Specimen Collection (cultures, biopsies, blood, body fluids, etc.) []  - 0 Specimen(s) / Culture(s) sent or taken to Lab for analysis []  - 0 Patient Transfer (multiple staff / Nurse, adult / Similar devices) []  - 0 Simple Staple / Suture removal (25 or less) []  - 0 Complex Staple / Suture removal (26 or more) []  - 0 Hypo / Hyperglycemic Management (close monitor of Blood Glucose) [name] ([id number]) [id number] Page 3 of 8 []  - 0 Ankle / Brachial Index (ABI) - do not check if billed separately X- 1 5 Vital Signs Has the patient been seen at the hospital within the last three years: Yes Total Score: 65 Level Of Care: New/Established - Level 2 Electronic Signature(s) Signed: 10/24/2022 3:54:16 PM By: Redmond Pulling RN, BSN Entered By: Redmond Pulling on 10/24/2022 08:11:27 -------------------------------------------------------------------------------- Encounter Discharge Information Details Patient Name: Date of  Service: [name]. 10/24/2022 9:45 A M Medical Record Number: [id number] Patient Account Number: [id number] Date of Birth/Sex: Treating RN: Aug 02, 1945 (77 y.o. Orville Govern Primary Care Patton Swisher: Sharmon Revere Other Clinician: Referring Rhodesia Stanger: Treating Berkeley Vanaken/Extender: Jenell Milliner, Himanshu Weeks in Treatment: 2 Encounter Discharge Information Items Discharge Condition: Stable Ambulatory Status: Ambulatory Discharge Destination: Home Transportation: Private Auto Accompanied By: self Schedule Follow-up Appointment: Yes Clinical Summary of Care: Patient Declined Electronic Signature(s) Signed: 10/24/2022 3:54:16 PM By: Redmond Pulling RN, BSN Entered By: Redmond Pulling on 10/24/2022 08:12:06 -------------------------------------------------------------------------------- Lower Extremity Assessment Details Patient Name: Date of Service: [name]. 10/24/2022 9:45 A M Medical Record Number: [id number] Patient Account Number: [id number] Date of Birth/Sex: Treating RN: September 14, 1945 (77 y.o. Orville Govern Primary Care Nissim Fleischer: Sharmon Revere Other Clinician: Referring Destynie Toomey: Treating Twala Collings/Extender: Jenell Milliner, Himanshu Weeks in Treatment: 2 Edema Assessment Assessed: [Left: No] [Right: No] Edema: [Left: Yes] [Right: Yes] Calf Left: Right: Point of Measurement: 35 cm From Medial Instep 48 cm 56 cm Ankle Left: Right: Point of Measurement: 13 cm From Medial Instep 32 cm 34 cm Vascular Assessment Left: [[id number] Page 4 of 8Right:] Pulses: Dorsalis Pedis Palpable: [[id number] Page 4 of 8Yes] Extremity colors, hair growth, and conditions: Extremity Color: [phone].pdf Page 4 of 8Hyperpigmented Hyperpigmented] Hair Growth on Extremity: [[id number] Page 4 of 8No No] Temperature of Extremity:  [phone].pdf Page 4 of 8Warm] Capillary Refill: [phone].pdf Page 4 of 8< 3 seconds < 3 seconds] Dependent Rubor: [phone].pdf Page 4 of 8No No Yes Yes] Electronic Signature(s) Signed: 10/24/2022 3:54:16 PM By: Redmond Pulling RN, BSN Entered By: Redmond Pulling on 10/24/2022 07:25:05 -------------------------------------------------------------------------------- Multi Wound Chart Details Patient Name: Date of Service: [name]. 10/24/2022 9:45 A M Medical Record Number: [id number] Patient Account Number: [id number] Date of Birth/Sex: Treating RN: [birth year] (77 y.o. F) Primary Care Kapri Nero: Sharmon Revere Other Clinician:  Referring Maesyn Frisinger: Treating Khala Tarte/Extender: Jenell Milliner, Himanshu Weeks in Treatment: 2 Vital Signs Height(in): 61 Pulse(bpm): 76 Weight(lbs): 248 Blood Pressure(mmHg): 135/71 Body Mass Index(BMI): 46.9 Temperature(F): 97.8 Respiratory Rate(breaths/min): 20 [3:Photos:] [N/A:N/A] Left, Medial, Posterior Lower Leg N/A N/A Wound Location: Gradually Appeared N/A N/A Wounding Event: Lymphedema N/A N/A Primary Etiology: Cataracts, Chronic sinus N/A N/A Comorbid History: problems/congestion, Lymphedema, Deep Vein Thrombosis, Osteoarthritis, Received Chemotherapy, Received Radiation 09/17/2022 N/A N/A Date Acquired: 2 N/A N/A Weeks of Treatment: Open N/A N/A Wound Status: No N/A N/A Wound Recurrence: 0x0x0 N/A N/A Measurements L x W x D (cm) 0 N/A N/A A (cm) : rea 0 N/A N/A Volume (cm) : 100.00% N/A N/A % Reduction in A rea: 100.00% N/A N/A % Reduction in Volume: Partial Thickness N/A N/A Classification: None Present N/A N/A Exudate A mount: Distinct, outline attached N/A N/A Wound Margin: None Present (0%) N/A N/A Granulation A mount: None Present (0%) N/A N/A Necrotic A mount: Fascia: No N/A N/A Exposed Structures: Fat Layer (Subcutaneous  Tissue): No Tendon: No Muscle: No [name], [name] ([id number]) [id number] Page 5 of 8 Joint: No Bone: No Large (67-100%) N/A N/A Epithelialization: Excoriation: No N/A N/A Periwound Skin Texture: Induration: No Callus: No Crepitus: No Rash: No Scarring: No Dry/Scaly: Yes N/A N/A Periwound Skin Moisture: Maceration: No Atrophie Blanche: No N/A N/A Periwound Skin Color: Cyanosis: No Ecchymosis: No Erythema: No Hemosiderin Staining: No Mottled: No Pallor: No Rubor: No No Abnormality N/A N/A Temperature: Treatment Notes Wound #3 (Lower Leg) Wound Laterality: Left, Medial, Posterior Cleanser Peri-Wound Care Topical Primary Dressing Secondary Dressing Secured With Compression Wrap Compression Stockings Add-Ons Electronic Signature(s) Signed: 10/24/2022 5:29:38 PM By: Geralyn Corwin DO Entered By: Geralyn Corwin on 10/24/2022 08:33:24 -------------------------------------------------------------------------------- Multi-Disciplinary Care Plan Details Patient Name: Date of Service: [name]. 10/24/2022 9:45 A M Medical Record Number: [id number] Patient Account Number: [id number] Date of Birth/Sex: Treating RN: 12/12/1945 (77 y.o. Orville Govern Primary Care Elyshia Kumagai: Sharmon Revere Other Clinician: Referring Aadyn Buchheit: Treating Rosie Torrez/Extender: Jenell Milliner, Himanshu Weeks in Treatment: 2 Active Inactive Electronic Signature(s) Signed: 10/24/2022 3:54:16 PM By: Redmond Pulling RN, BSN Entered By: Redmond Pulling on 10/24/2022 07:35:30 [name] ([id number]) [id number] Page 6 of 8 -------------------------------------------------------------------------------- Pain Assessment Details Patient Name: Date of Service: [name], [name]. 10/24/2022 9:45 A M Medical Record Number: [id number] Patient Account Number: [id number] Date of Birth/Sex: Treating RN: 10-29-1945 (77 y.o. Orville Govern Primary Care Annelyse Rey: Sharmon Revere Other Clinician: Referring Mersedes Alber: Treating Javid Kemler/Extender: Jenell Milliner, Himanshu Weeks in Treatment: 2 Active Problems Location of Pain Severity and Description of Pain Patient Has Paino Yes Site Locations Rate the pain. Current Pain Level: 5 Pain Management and Medication Current Pain Management: Electronic Signature(s) Signed: 10/24/2022 3:54:16 PM By: Redmond Pulling RN, BSN Entered By: Redmond Pulling on 10/24/2022 07:23:45 -------------------------------------------------------------------------------- Patient/Caregiver Education Details Patient Name: Date of Service: [name] 9/5/2024andnbsp9:45 A M Medical Record Number: [id number] Patient Account Number: [id number] Date of Birth/Gender: Treating RN: [birth year] (77 y.o. Orville Govern Primary Care Physician: Sharmon Revere Other Clinician: Referring Physician: Treating Physician/Extender: Florene Route Weeks in Treatment: 2 Education Assessment Education Provided To: Patient Education Topics Provided Wound/Skin Impairment: Methods: Explain/Verbal Responses: State content correctly [name], [name] ([id number]) [id number] Page 7 of 8 Electronic Signature(s) Signed: 10/24/2022 3:54:16 PM By: Redmond Pulling RN, BSN Entered By: Redmond Pulling on 10/24/2022 07:33:06 -------------------------------------------------------------------------------- Wound Assessment Details Patient Name: Date of Service: [name]. 10/24/2022 9:45 A M  Medical Record Number: [id number] Patient Account Number: [id number] Date of Birth/Sex: Treating RN: Sep 02, 1945 (76 y.o. Orville Govern Primary Care Dayani Winbush: Sharmon Revere Other Clinician: Referring Dirk Vanaman: Treating Lumen Brinlee/Extender: Jenell Milliner, Himanshu Weeks in Treatment: 2 Wound Status Wound Number: 3 Primary Lymphedema Etiology: Wound  Location: Left, Medial, Posterior Lower Leg Wound Open Wounding Event: Gradually Appeared Status: Date Acquired: 09/17/2022 Comorbid Cataracts, Chronic sinus problems/congestion, Lymphedema, Deep Weeks Of Treatment: 2 History: Vein Thrombosis, Osteoarthritis, Received Chemotherapy, Received Clustered Wound: No Radiation Photos Wound Measurements Length: (cm) Width: (cm) Depth: (cm) Area: (cm) Volume: (cm) 0 % Reduction in Area: 100% 0 % Reduction in Volume: 100% 0 Epithelialization: Large (67-100%) 0 Tunneling: No 0 Undermining: No Wound Description Classification: Partial Thickness Wound Margin: Distinct, outline attached Exudate Amount: None Present Foul Odor After Cleansing: No Slough/Fibrino No Wound Bed Granulation Amount: None Present (0%) Exposed Structure Necrotic Amount: None Present (0%) Fascia Exposed: No Fat Layer (Subcutaneous Tissue) Exposed: No Tendon Exposed: No Muscle Exposed: No Joint Exposed: No Bone Exposed: No Periwound Skin Texture Texture Color No Abnormalities Noted: No No Abnormalities Noted: No Callus: No Atrophie Blanche: No Crepitus: No Cyanosis: No Excoriation: No Ecchymosis: No Induration: No Erythema: No Rash: No Hemosiderin Staining: No [name], [name] ([id number]) [id number] Page 8 of 8 Scarring: No Mottled: No Pallor: No Moisture Rubor: No No Abnormalities Noted: No Dry / Scaly: Yes Temperature / Pain Maceration: No Temperature: No Abnormality Electronic Signature(s) Signed: 10/24/2022 3:54:16 PM By: Redmond Pulling RN, BSN Entered By: Redmond Pulling on 10/24/2022 07:27:38 -------------------------------------------------------------------------------- Vitals Details Patient Name: Date of Service: [name]. 10/24/2022 9:45 A M Medical Record Number: [id number] Patient Account Number: [id number] Date of Birth/Sex: Treating RN: 1945-10-07 (77 y.o. Orville Govern Primary Care Kandas Oliveto:  Sharmon Revere Other Clinician: Referring Echo Allsbrook: Treating Shanzay Hepworth/Extender: Jenell Milliner, Himanshu Weeks in Treatment: 2 Vital Signs Time Taken: 10:29 Temperature (F): 97.8 Height (in): 61 Pulse (bpm): 76 Weight (lbs): 248 Respiratory Rate (breaths/min): 20 Body Mass Index (BMI): 46.9 Blood Pressure (mmHg): 135/71 Reference Range: 80 - 120 mg / dl Electronic Signature(s) Signed: 10/24/2022 3:54:16 PM By: Redmond Pulling RN, BSN Entered By: Redmond Pulling on 10/24/2022 95:63:87

## 2022-10-25 NOTE — Progress Notes (Signed)
[name], [name] ([id number]) [id number] Page 1 of 8 Visit Report for 10/24/2022 Chief Complaint Document Details Patient Name: Date of Service: [name], [name]. 10/24/2022 9:45 A M Medical Record Number: [id number] Patient Account Number: [id number] Date of Birth/Sex: Treating RN: October 15, 1945 (77 y.o. F) Primary Care Provider: Sharmon Revere Other Clinician: Referring Provider: Treating Provider/Extender: Jenell Milliner, Himanshu Weeks in Treatment: 2 Information Obtained from: Patient Chief Complaint 05/09/2022; history of wound to the right posterior leg in the setting of lymphedema 10/08/2022; left posterior leg wound Electronic Signature(s) Signed: 10/24/2022 5:29:38 PM By: Geralyn Corwin DO Entered By: Geralyn Corwin on 10/24/2022 08:33:40 -------------------------------------------------------------------------------- HPI Details Patient Name: Date of Service: Leslie Lofty. 10/24/2022 9:45 A M Medical Record Number: [id number] Patient Account Number: [id number] Date of Birth/Sex: Treating RN: June 16, 1945 (77 y.o. F) Primary Care Provider: Sharmon Revere Other Clinician: Referring Provider: Treating Provider/Extender: Jenell Milliner, Himanshu Weeks in Treatment: 2 History of Present Illness HPI Description: 77 year old female presents with left leg wound present at least since May that has been getting worse, in July she was admitted for occlusion of left lower extremity venous stent that was reopened by venoplasty by Dr. Pascal Lux. Patient was on Coumadin for chronic DVT but switch to Eliquis since July. Patient was undergoing treatment for uterine cancer but the chemotherapy is on hold until wound healing is assured. Patient has fair amount of discomfort in the left leg due to the wounds. She also states her left leg has been more swollen than usual although both legs are fairly big Patient is ABI in the clinic today is 1.1 on the  left Patient is history of has history of IVC filter placement, DVT that was totally occlusive on the left that required stenting, that restenosed and required recent management Patient is currently set up with home health with wound care 8/20; this is a patient with a complicated venous history. This apparently includes a chronic IVC filter and a stent in her left iliac vein. She was undergoing chemotherapy for uterine cancer but that is on hold until a fairly large but superficial area on her left anterior tibial area is healed. She is I think booked tomorrow for a thrombolyzes attempt of this stent in the left iliac vein. As such we should be able to wrap her today on her lower extremity. Also problematic is at home health came on Wednesday and only put kerlix on her 8/27; this is a patient who underwent a left common external iliac vein stent for what was thought to be a clot but turned out to be stagnant flow secondary to invasive endometrial cancer. She then underwent radiation therapy and was found to have an occlusive stent. She subsequently underwent mechanical thrombectomy with repeat stenting of the left common external leg veins. She has had an IVC iliac study which demonstrates patent common external iliac vein stents on the left. The greater saphenous vein and saphenofemoral junction does appear occluded. They will see her again in 4 to 6 months. The patient's wound on the left anterior and left lateral lower leg looks really quite healthy. Most of this is 100% epithelialized. She will need bilateral juxta lite stockings which we are going to go ahead and try to order for her today. We have discharged home health who did not show up last week anyway 9/3; the patient came in with a left anterior tibial area wound is closed. She did not come with stockings. We ordered her juxta lite stockings last  week and told her that the right leg would not be covered brackets no open wound] and  depending on the insurance she may have some coverage for the left leg. Her insurance call they do not cover this she comes in with no stocking. 9/10-Patient returns to clinic for having the juxta lights put on, she does have the stockings with her today. Her wounds are healed 05/09/2022 Ms. [name] is a 77 year old female with a past medical history of left leg DVT endometrial cancer, lymphedema that presents to the clinic for a previous , wound to the posterior right leg. She states that an area had blistered up and opened Several weeks ago but has since healed. She does not wear compression stockings. She states they are too difficult to put on. I recommended stocking device to help with this however she states she has this and it also is not sufficient. Currently she denies signs of infection. [name], [name] ([id number]) [id number] Page 2 of 8 10/08/2022 Ms. [name] is a 77 year old female with a past medical history of lymphedema and venous insufficiency that presents to the clinic for a 3 to 4-week history of nonhealing wound to the posterior left leg. She does not wear compression stockings. She states these are too difficult to put on. She has been keeping the area covered. She denies signs of infection. 8/27; patient presents for follow-up. We have been using silver alginate with antibiotic ointment under Urgo K2 lite. Wound is almost healed. We will order her compression Velcro garments today. 9/5; patient presents for follow-up. We have been using silver alginate with antibiotic ointment under Urgo K2 light. Her wound is healed. She has very compression Velcro garments with her today. We showed her how to put these on. Electronic Signature(s) Signed: 10/24/2022 5:29:38 PM By: Geralyn Corwin DO Entered By: Geralyn Corwin on 10/24/2022 08:34:37 -------------------------------------------------------------------------------- Physical Exam  Details Patient Name: Date of Service: Leslie Lofty. 10/24/2022 9:45 A M Medical Record Number: [id number] Patient Account Number: [id number] Date of Birth/Sex: Treating RN: May 04, 1945 (77 y.o. F) Primary Care Provider: Sharmon Revere Other Clinician: Referring Provider: Treating Provider/Extender: Jenell Milliner, Himanshu Weeks in Treatment: 2 Constitutional respirations regular, non-labored and within target range for patient.. Cardiovascular 2+ dorsalis pedis/posterior tibialis pulses. Psychiatric pleasant and cooperative. Notes Left lower extremity: T the posterior aspect there is epithelization to the previous wound site. No drainage noted. Lymphedema skin changes present. Decent o edema control. No signs of infection. Electronic Signature(s) Signed: 10/24/2022 5:29:38 PM By: Geralyn Corwin DO Entered By: Geralyn Corwin on 10/24/2022 08:36:08 -------------------------------------------------------------------------------- Physician Orders Details Patient Name: Date of Service: Leslie Lofty. 10/24/2022 9:45 A M Medical Record Number: [id number] Patient Account Number: [id number] Date of Birth/Sex: Treating RN: 29-Sep-1945 (77 y.o. Orville Govern Primary Care Provider: Sharmon Revere Other Clinician: Referring Provider: Treating Provider/Extender: Jenell Milliner, Himanshu Weeks in Treatment: 2 Verbal / Phone Orders: No Diagnosis Coding Discharge From [facility] Discharge from Wound Care Center - Congratulations!!!! Please call wound center if you notice any open areas, drainage or have any concerns. Bathing/ Shower/ Hygiene May shower and wash wound with soap and water. VALENTINA, DEVEY ([id number]) [id number] Page 3 of 8 Edema Control - Lymphedema / SCD / Other Elevate legs to the level of the heart or above for 30 minutes daily and/or when sitting for 3-4 times a day throughout the day. Avoid standing for long  periods of time. Exercise regularly Compression stocking or Garment 30-40 mm/Hg  pressure to: - [employer]- bilateral lower legs Juxtafites qty3 every 6 months for life both legs. Electronic Signature(s) Signed: 10/24/2022 5:29:38 PM By: Geralyn Corwin DO Entered By: Geralyn Corwin on 10/24/2022 08:36:18 -------------------------------------------------------------------------------- Problem List Details Patient Name: Date of Service: Leslie Lofty. 10/24/2022 9:45 A M Medical Record Number: [id number] Patient Account Number: [id number] Date of Birth/Sex: Treating RN: March 06, 1945 (77 y.o. F) Primary Care Provider: Sharmon Revere Other Clinician: Referring Provider: Treating Provider/Extender: Jenell Milliner, Himanshu Weeks in Treatment: 2 Active Problems ICD-10 Encounter Code Description Active Date MDM Diagnosis I89.0 Lymphedema, not elsewhere classified 10/08/2022 No Yes I87.312 Chronic venous hypertension (idiopathic) with ulcer of left lower extremity 10/08/2022 No Yes L97.822 Non-pressure chronic ulcer of other part of left lower leg with fat layer exposed8/20/2024 No Yes Inactive Problems Resolved Problems Electronic Signature(s) Signed: 10/24/2022 5:29:38 PM By: Geralyn Corwin DO Entered By: Geralyn Corwin on 10/24/2022 08:33:14 -------------------------------------------------------------------------------- Progress Note Details Patient Name: Date of Service: Leslie Lofty. 10/24/2022 9:45 A M Medical Record Number: [id number] Patient Account Number: [id number] Date of Birth/Sex: Treating RN: Jul 13, 1945 (77 y.o. F) Primary Care Provider: Sharmon Revere Other Clinician: Referring Provider: Treating Provider/Extender: Jenell Milliner, Himanshu Weeks in Treatment: 2 Subjective [name] ([id number]) [id number] Page 4 of 8 Chief Complaint Information obtained from Patient 05/09/2022; history of wound to  the right posterior leg in the setting of lymphedema 10/08/2022; left posterior leg wound History of Present Illness (HPI) 77 year old female presents with left leg wound present at least since May that has been getting worse, in July she was admitted for occlusion of left lower extremity venous stent that was reopened by venoplasty by Dr. Pascal Lux. Patient was on Coumadin for chronic DVT but switch to Eliquis since July. Patient was undergoing treatment for uterine cancer but the chemotherapy is on hold until wound healing is assured. Patient has fair amount of discomfort in the left leg due to the wounds. She also states her left leg has been more swollen than usual although both legs are fairly big Patient is ABI in the clinic today is 1.1 on the left Patient is history of has history of IVC filter placement, DVT that was totally occlusive on the left that required stenting, that restenosed and required recent management Patient is currently set up with home health with wound care 8/20; this is a patient with a complicated venous history. This apparently includes a chronic IVC filter and a stent in her left iliac vein. She was undergoing chemotherapy for uterine cancer but that is on hold until a fairly large but superficial area on her left anterior tibial area is healed. She is I think booked tomorrow for a thrombolyzes attempt of this stent in the left iliac vein. As such we should be able to wrap her today on her lower extremity. Also problematic is at home health came on Wednesday and only put kerlix on her 8/27; this is a patient who underwent a left common external iliac vein stent for what was thought to be a clot but turned out to be stagnant flow secondary to invasive endometrial cancer. She then underwent radiation therapy and was found to have an occlusive stent. She subsequently underwent mechanical thrombectomy with repeat stenting of the left common external leg veins. She has had an  IVC iliac study which demonstrates patent common external iliac vein stents on the left. The greater saphenous vein and saphenofemoral junction does appear occluded. They will see her again in 4  to 6 months. The patient's wound on the left anterior and left lateral lower leg looks really quite healthy. Most of this is 100% epithelialized. She will need bilateral juxta lite stockings which we are going to go ahead and try to order for her today. We have discharged home health who did not show up last week anyway 9/3; the patient came in with a left anterior tibial area wound is closed. She did not come with stockings. We ordered her juxta lite stockings last week and told her that the right leg would not be covered brackets no open wound] and depending on the insurance she may have some coverage for the left leg. Her insurance call they do not cover this she comes in with no stocking. 9/10-Patient returns to clinic for having the juxta lights put on, she does have the stockings with her today. Her wounds are healed 05/09/2022 Ms. [name] is a 77 year old female with a past medical history of left leg DVT endometrial cancer, lymphedema that presents to the clinic for a previous , wound to the posterior right leg. She states that an area had blistered up and opened Several weeks ago but has since healed. She does not wear compression stockings. She states they are too difficult to put on. I recommended stocking device to help with this however she states she has this and it also is not sufficient. Currently she denies signs of infection. 10/08/2022 Ms. [name] is a 77 year old female with a past medical history of lymphedema and venous insufficiency that presents to the clinic for a 3 to 4-week history of nonhealing wound to the posterior left leg. She does not wear compression stockings. She states these are too difficult to put on. She has been keeping the area covered. She denies signs of  infection. 8/27; patient presents for follow-up. We have been using silver alginate with antibiotic ointment under Urgo K2 lite. Wound is almost healed. We will order her compression Velcro garments today. 9/5; patient presents for follow-up. We have been using silver alginate with antibiotic ointment under Urgo K2 light. Her wound is healed. She has very compression Velcro garments with her today. We showed her how to put these on. Patient History Information obtained from Patient. Family History Cancer - Maternal Grandparents, Diabetes - Siblings, No family history of Heart Disease, Hereditary Spherocytosis, Hypertension, Kidney Disease, Lung Disease, Seizures, Stroke, Thyroid Problems, Tuberculosis. Social History Former smoker - quit over 20 years ago, Marital Status - Single, Alcohol Use - Never, Drug Use - No History, Caffeine Use - Moderate - Coffee. Medical History Eyes Patient has history of Cataracts - removed Denies history of Glaucoma, Optic Neuritis Ear/Nose/Mouth/Throat Patient has history of Chronic sinus problems/congestion Denies history of Middle ear problems Hematologic/Lymphatic Patient has history of Lymphedema - lower legs Denies history of Anemia, Hemophilia, Human Immunodeficiency Virus, Sickle Cell Disease Respiratory Denies history of Aspiration, Asthma, Chronic Obstructive Pulmonary Disease (COPD), Pneumothorax, Tuberculosis Cardiovascular Patient has history of Deep Vein Thrombosis - left leg Denies history of Angina, Arrhythmia, Congestive Heart Failure, Coronary Artery Disease, Hypertension, Hypotension, Myocardial Infarction, Peripheral Arterial Disease, Peripheral Venous Disease, Phlebitis, Vasculitis Gastrointestinal Denies history of Cirrhosis , Colitis, Crohns, Hepatitis A, Hepatitis B, Hepatitis C Endocrine Denies history of Type I Diabetes, Type II Diabetes Genitourinary Denies history of End Stage Renal Disease Immunological Denies history of  Lupus Erythematosus, Raynauds, Scleroderma Integumentary (Skin) Denies history of History of Burn Musculoskeletal Patient has history of Osteoarthritis Denies history of Gout, Rheumatoid Arthritis,  Osteomyelitis Neurologic Denies history of Dementia, Neuropathy, Quadriplegia, Paraplegia, Seizure Disorder Oncologic [name], [name] ([id number]) [id number] Page 5 of 8 Patient has history of Received Chemotherapy, Received Radiation Hospitalization/Surgery History - right port a cath place. - bilateral knee replacements. - hysterectomy. - cholecystectomy. Medical A Surgical History Notes nd Hematologic/Lymphatic Pancytopenia Gastrointestinal GERD Musculoskeletal DJD left knee Oncologic Uterine Cancer Objective Constitutional respirations regular, non-labored and within target range for patient.. Vitals Time Taken: 10:29 AM, Height: 61 in, Weight: 248 lbs, BMI: 46.9, Temperature: 97.8 F, Pulse: 76 bpm, Respiratory Rate: 20 breaths/min, Blood Pressure: 135/71 mmHg. Cardiovascular 2+ dorsalis pedis/posterior tibialis pulses. Psychiatric pleasant and cooperative. General Notes: Left lower extremity: T the posterior aspect there is epithelization to the previous wound site. No drainage noted. Lymphedema skin changes o present. Decent edema control. No signs of infection. Integumentary (Hair, Skin) Wound #3 status is Open. Original cause of wound was Gradually Appeared. The date acquired was: 09/17/2022. The wound has been in treatment 2 weeks. The wound is located on the Left,Medial,Posterior Lower Leg. The wound measures 0cm length x 0cm width x 0cm depth; 0cm^2 area and 0cm^3 volume. There is no tunneling or undermining noted. There is a none present amount of drainage noted. The wound margin is distinct with the outline attached to the wound base. There is no granulation within the wound bed. There is no necrotic tissue within the wound bed. The periwound  skin appearance exhibited: Dry/Scaly. The periwound skin appearance did not exhibit: Callus, Crepitus, Excoriation, Induration, Rash, Scarring, Maceration, Atrophie Blanche, Cyanosis, Ecchymosis, Hemosiderin Staining, Mottled, Pallor, Rubor, Erythema. Periwound temperature was noted as No Abnormality. 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 Her wound has healed. I recommended Velcro compression garments daily. Follow-up as needed. Plan Discharge From [facility] Services: Discharge from Wound Care Center - Congratulations!!!! Please call wound center if you notice any open areas, drainage or have any concerns. Bathing/ Shower/ Hygiene: May shower and wash wound with soap and water. Edema Control - Lymphedema / SCD / Other: Elevate legs to the level of the heart or above for 30 minutes daily and/or when sitting for 3-4 times a day throughout the day. Avoid standing for long periods of time. Exercise regularly Compression stocking or Garment 30-40 mm/Hg pressure to: - [employer]- bilateral lower legs Juxtafites qty3 every 6 months for life both legs. 1. Juxta lite compression Velcro garments daily 2. Follow-up as needed [name], [name] ([id number]) [id number] Page 6 of 8 3. Discharge from clinic due to closed wound Electronic Signature(s) Signed: 10/24/2022 5:29:38 PM By: Geralyn Corwin DO Entered By: Geralyn Corwin on 10/24/2022 08:37:01 -------------------------------------------------------------------------------- HxROS Details Patient Name: Date of Service: Leslie Lofty. 10/24/2022 9:45 A M Medical Record Number: [id number] Patient Account Number: [id number] Date of Birth/Sex: Treating  RN: Jul 20, 1945 (77 y.o. F) Primary Care Provider: Sharmon Revere Other Clinician: Referring Provider: Treating Provider/Extender: Jenell Milliner, Himanshu Weeks in Treatment: 2 Information Obtained From Patient Eyes Medical History: Positive for: Cataracts - removed Negative for: Glaucoma; Optic Neuritis Ear/Nose/Mouth/Throat Medical History: Positive for: Chronic sinus problems/congestion Negative for: Middle ear problems Hematologic/Lymphatic Medical History: Positive for: Lymphedema - lower legs Negative for: Anemia; Hemophilia; Human Immunodeficiency Virus; Sickle Cell Disease Past Medical History Notes: Pancytopenia Respiratory Medical History: Negative for: Aspiration; Asthma; Chronic Obstructive  Pulmonary Disease (COPD); Pneumothorax; Tuberculosis Cardiovascular Medical History: Positive for: Deep Vein Thrombosis - left leg Negative for: Angina; Arrhythmia; Congestive Heart Failure; Coronary Artery Disease; Hypertension; Hypotension; Myocardial Infarction; Peripheral Arterial Disease; Peripheral Venous Disease; Phlebitis; Vasculitis Gastrointestinal Medical History: Negative for: Cirrhosis ; Colitis; Crohns; Hepatitis A; Hepatitis B; Hepatitis C Past Medical History Notes: GERD Endocrine Medical History: Negative for: Type I Diabetes; Type II Diabetes Genitourinary Medical History: Negative for: End Stage Renal Disease Immunological [name], [name] ([id number]) [id number] Page 7 of 8 Medical History: Negative for: Lupus Erythematosus; Raynauds; Scleroderma Integumentary (Skin) Medical History: Negative for: History of Burn Musculoskeletal Medical History: Positive for: Osteoarthritis Negative for: Gout; Rheumatoid Arthritis; Osteomyelitis Past Medical History Notes: DJD left knee Neurologic Medical History: Negative for: Dementia; Neuropathy; Quadriplegia; Paraplegia; Seizure Disorder Oncologic Medical  History: Positive for: Received Chemotherapy; Received Radiation Past Medical History Notes: Uterine Cancer HBO Extended History Items Ear/Nose/Mouth/Throat: Eyes: Chronic sinus Cataracts problems/congestion Immunizations Pneumococcal Vaccine: Received Pneumococcal Vaccination: Yes Received Pneumococcal Vaccination On or After 60th Birthday: Yes Implantable Devices Yes Hospitalization / Surgery History Type of Hospitalization/Surgery right port a cath place bilateral knee replacements hysterectomy cholecystectomy Family and Social History Cancer: Yes - Maternal Grandparents; Diabetes: Yes - Siblings; Heart Disease: No; Hereditary Spherocytosis: No; Hypertension: No; Kidney Disease: No; Lung Disease: No; Seizures: No; Stroke: No; Thyroid Problems: No; Tuberculosis: No; Former smoker - quit over 20 years ago; Marital Status - Single; Alcohol Use: Never; Drug Use: No History; Caffeine Use: Moderate - Coffee; Financial Concerns: No; Food, Clothing or Shelter Needs: No; Support System Lacking: No; Transportation Concerns: No Electronic Signature(s) Signed: 10/24/2022 5:29:38 PM By: Geralyn Corwin DO Entered By: Geralyn Corwin on 10/24/2022 08:34:47 -------------------------------------------------------------------------------- SuperBill Details Patient Name: Date of Service: Leslie Lofty. 10/24/2022 Medical Record Number: [id number] Patient Account Number: [id number] Date of Birth/Sex: Treating RN: 11-12-1945 (77 y.o. Orville Govern Primary Care Provider: Sharmon Revere Other Clinician: Referring Provider: Treating Provider/Extender: Jenell Milliner, Himanshu Weeks in Treatment: 2 [name] ([id number]) [id number] Page 8 of 8 Diagnosis Coding ICD-10 Codes Code Description I89.0 Lymphedema, not elsewhere classified I87.312 Chronic venous hypertension (idiopathic) with ulcer of left lower extremity L97.822 Non-pressure  chronic ulcer of other part of left lower leg with fat layer exposed Facility Procedures : 7 CPT4 Code: 0865784 Description: 69629 - WOUND CARE VISIT-LEV 2 EST PT Modifier: Quantity: 1 Physician Procedures : CPT4 Code Description Modifier [id number] - WC PHYS LEVEL 3 - EST PT ICD-10 Diagnosis Description I89.0 Lymphedema, not elsewhere classified I87.312 Chronic venous hypertension (idiopathic) with ulcer of left lower extremity L97.822 Non-pressure  chronic ulcer of other part of left lower leg with fat layer exposed Quantity: 1 Electronic Signature(s) Signed: 10/24/2022 5:29:38 PM By: Geralyn Corwin DO Entered By: Geralyn Corwin on 10/24/2022 08:37:20

## 2022-10-31 ENCOUNTER — Encounter: Payer: Self-pay | Admitting: Hematology and Oncology

## 2022-10-31 ENCOUNTER — Inpatient Hospital Stay (HOSPITAL_BASED_OUTPATIENT_CLINIC_OR_DEPARTMENT_OTHER): Payer: 59 | Admitting: Hematology and Oncology

## 2022-10-31 VITALS — BP 146/64 | HR 89 | Resp 18 | Ht 62.33 in

## 2022-10-31 DIAGNOSIS — Z79899 Other long term (current) drug therapy: Secondary | ICD-10-CM | POA: Diagnosis not present

## 2022-10-31 DIAGNOSIS — R609 Edema, unspecified: Secondary | ICD-10-CM | POA: Diagnosis not present

## 2022-10-31 DIAGNOSIS — G893 Neoplasm related pain (acute) (chronic): Secondary | ICD-10-CM | POA: Diagnosis not present

## 2022-10-31 DIAGNOSIS — Z8542 Personal history of malignant neoplasm of other parts of uterus: Secondary | ICD-10-CM | POA: Diagnosis present

## 2022-10-31 DIAGNOSIS — I825Z2 Chronic embolism and thrombosis of unspecified deep veins of left distal lower extremity: Secondary | ICD-10-CM | POA: Diagnosis not present

## 2022-10-31 DIAGNOSIS — D61818 Other pancytopenia: Secondary | ICD-10-CM

## 2022-10-31 DIAGNOSIS — K449 Diaphragmatic hernia without obstruction or gangrene: Secondary | ICD-10-CM | POA: Diagnosis not present

## 2022-10-31 DIAGNOSIS — N329 Bladder disorder, unspecified: Secondary | ICD-10-CM | POA: Diagnosis not present

## 2022-10-31 DIAGNOSIS — K5730 Diverticulosis of large intestine without perforation or abscess without bleeding: Secondary | ICD-10-CM | POA: Diagnosis not present

## 2022-10-31 DIAGNOSIS — I700 Atherosclerosis of aorta: Secondary | ICD-10-CM | POA: Diagnosis not present

## 2022-10-31 DIAGNOSIS — Z86718 Personal history of other venous thrombosis and embolism: Secondary | ICD-10-CM | POA: Diagnosis not present

## 2022-10-31 DIAGNOSIS — C7951 Secondary malignant neoplasm of bone: Secondary | ICD-10-CM | POA: Diagnosis not present

## 2022-10-31 DIAGNOSIS — I708 Atherosclerosis of other arteries: Secondary | ICD-10-CM | POA: Diagnosis not present

## 2022-10-31 DIAGNOSIS — K429 Umbilical hernia without obstruction or gangrene: Secondary | ICD-10-CM | POA: Diagnosis not present

## 2022-10-31 DIAGNOSIS — Z7901 Long term (current) use of anticoagulants: Secondary | ICD-10-CM | POA: Diagnosis not present

## 2022-10-31 DIAGNOSIS — C55 Malignant neoplasm of uterus, part unspecified: Secondary | ICD-10-CM

## 2022-10-31 MED ORDER — OXYCODONE HCL 10 MG PO TABS: 10.0000 mg | ORAL_TABLET | Freq: Four times a day (QID) | ORAL | 0 refills | Status: AC | PRN

## 2022-10-31 NOTE — Assessment & Plan Note (Signed)
I have reviewed her CT imaging extensively including multiple CT imaging dated back to 2020 The area of concern has been present before but agree with interpretation by radiologist that it appears more prominent this time Plan to repeat CT imaging in 6 months

## 2022-10-31 NOTE — Assessment & Plan Note (Signed)
She is not taking morphine sulfate on a regular basis I recommend changing her to lower dose oxycodone and she agreed

## 2022-10-31 NOTE — Assessment & Plan Note (Signed)
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

## 2022-10-31 NOTE — Assessment & Plan Note (Addendum)
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

## 2022-10-31 NOTE — Progress Notes (Signed)
[facility] OFFICE PROGRESS NOTE  Patient Care Team: Sharmon Revere, MD as PCP - General (Family Medicine)  ASSESSMENT & PLAN:  Uterine cancer (HCC) I have reviewed imaging studies with the patient She has no signs of cancer recurrence I plan to see her again in 6 months for further follow-up and repeat CT imaging We discussed port maintenance  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 [facility]) She has chronic left hip pain The left side of her hip has received significant radiation Discussed high risk surgery due to history of DVT and significant bone destruction due to radiation After much discussion, she is in agreement not to pursue joint replacement and will continue to take pain medicine as needed  Lower leg DVT (deep venous thromboembolism), chronic, left (HCC) She is taking both anticoagulation therapy and antiplatelet agent She has no recent bleeding She will continue her medications as directed   Pancytopenia, acquired (HCC) She has chronic intermittent leukopenia but not symptomatic The cause of anemia is likely due to anemia chronic illness She is not symptomatic Observe  Lesion of bladder I have reviewed her CT imaging extensively including multiple CT imaging dated back to 2020 The area of concern has been present before but agree with interpretation by radiologist that it appears more prominent this time Plan to repeat CT imaging in 6 months  Cancer associated pain She is not taking morphine sulfate on a regular basis I recommend changing her to lower dose oxycodone and she agreed  Orders Placed This Encounter  Procedures   CT ABDOMEN PELVIS W CONTRAST    Standing Status:   Future    Standing Expiration Date:   10/31/2023    Scheduling Instructions:     No need oral contrast    Order Specific Question:   If indicated for the ordered procedure, I authorize the administration of contrast media per Radiology protocol    Answer:   Yes    Order Specific Question:    Does the patient have a contrast media/X-ray dye allergy?    Answer:   No    Order Specific Question:   Preferred imaging location?    Answer:   [facility]    Order Specific Question:   If indicated for the ordered procedure, I authorize the administration of oral contrast media per Radiology protocol    Answer:   Yes    All questions were answered. The patient knows to call the clinic with any problems, questions or concerns. The total time spent in the appointment was 40 minutes encounter with patients including review of chart and various tests results, discussions about plan of care and coordination of care plan   Artis Delay, MD 10/31/2022 10:13 AM  INTERVAL HISTORY: Please see below for problem oriented charting. she returns for surveillance follow-up She has completed treatment a year ago She is doing well and remained asymptomatic She continues to have chronic hip pain but she is only taking morphine once every few days We discussed risk and benefits of joint replacement and future follow-up  REVIEW OF SYSTEMS:   Constitutional: Denies fevers, chills or abnormal weight loss Eyes: Denies blurriness of vision Ears, nose, mouth, throat, and face: Denies mucositis or sore throat Respiratory: Denies cough, dyspnea or wheezes Cardiovascular: Denies palpitation, chest discomfort or lower extremity swelling Gastrointestinal:  Denies nausea, heartburn or change in bowel habits Skin: Denies abnormal skin rashes Lymphatics: Denies new lymphadenopathy or easy bruising Neurological:Denies numbness, tingling or new weaknesses Behavioral/Psych: Mood is stable, no  new changes  All other systems were reviewed with the patient and are negative.  I have reviewed the past medical history, past surgical history, social history and family history with the patient and they are unchanged from previous note.  ALLERGIES:  has No Known Allergies.  MEDICATIONS:  Current Outpatient  Medications  Medication Sig Dispense Refill   oxyCODONE 10 MG TABS Take 1 tablet (10 mg total) by mouth every 6 (six) hours as needed for severe pain. 60 tablet 0   acetaminophen (TYLENOL) 325 MG tablet Take by mouth.     apixaban (ELIQUIS) 2.5 MG TABS tablet Take by mouth 2 (two) times daily.     diclofenac sodium (VOLTAREN) 1 % GEL APPLY 4GRAMS 4 TIMES A DAY AS NEEDED FOR PAINS     furosemide (LASIX) 40 MG tablet Take 40 mg by mouth daily as needed.     gabapentin (NEURONTIN) 300 MG capsule Take 300 mg by mouth 3 (three) times daily.     lidocaine-prilocaine (EMLA) cream Apply 1 application topically daily as needed. 30 g 3   methadone (DOLOPHINE) 10 MG tablet Take 1 tablet (10 mg total) by mouth every 12 (twelve) hours. 60 tablet 0   Olopatadine HCl 0.2 % SOLN Place 1 drop into both eyes daily.     potassium chloride (KLOR-CON) 10 MEQ tablet Take 10 mEq by mouth daily.     No current facility-administered medications for this visit.    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 Uterus +/- tubes/ovaries, neoplastic, with left fallopian tube and ovary - INVASIVE ENDOMETRIOID CARCINOMA (1.5 CM), FIGO GRADE II, ARISING IN A BACKGROUND OF ATYPICAL COMPLEX HYPERPLASIA, CONFINED WITHIN INNER HALF OF THE MYOMETRIUM. - ENDOMETRIAL POLYP WITH ASSOCIATED ATYPICAL COMPLEX HYPERPLASIA. - MYOMETRIUM: LEIOMYOMATA. - CERVIX: BENIGN SQUAMOUS MUCOSA AND ENDOCERVICAL MUCOSA, NO DYSPLASIA OR MALIGNANCY. - LEFT OVARY: BENIGN OVARIAN TISSUE WITH ENDOSALPINGOSIS, NO EVIDENCE OF ATYPIA OR MALIGNANCY. - LEFT FALLOPIAN TUBE: NO HISTOLOGIC ABNORMALITIES. - PLEASE SEE ONCOLOGY TEMPLATE FOR DETAIL. 2. Ovary and fallopian tube, right - BENIGN OVARIAN TISSUE WITH ENDOSALPINGOSIS, NO ATYPIA OR  MALIGNANCY. - BENIGN FALLOPIAN TUBAL TISSUE, NO PATHOLOGIC ABNORMALITIES. Microscopic Comment 1. UTERUS Specimen: Uterus, cervix, bilateral ovaries and fallopian tubes Procedure: Total hysterectomy and bilateral salpingo-oophorectomy Lymph node sampling performed: No Specimen integrity: Intact Maximum tumor size (cm): 1.5 cm, glass slide measurement Histologic type: Invasive endometrioid carcinoma Grade: FIGO grade II Myometrial invasion: 1 cm where myometrium is 2.3 cm in thickness Cervical stromal involvement: No Extent of involvement of other organs: No Lymph vascular invasion: Not identified Peritoneal washings: Negative ([id number]) Lymph nodes: number examined N/A; number positive N/A TNM code: pT1a, pNX 1 oFf 3IGO Stage (based on pathologic findings, needs clinical correlation): IA  Comments: Sections the endomyometrium away from the grossly identified endometrial polyp show an invasive FIGO grade II endometrioid carcinoma. The tumor is confined within inner half of the myometrium. No angiolymphatic invasion is identified. No cervical stromal involvement is identified. Sections of the grossly identified endometrial polyp show an endometrial polyp with associated atypical compacted hyperplasia with no definitive evidence of carcinoma.   12/07/2017 Imaging   US venous Doppler Right: No evidence of common femoral vein obstruction. Left: Findings consistent with acute deep vein thrombosis involving the left femoral vein, left proximal profunda vein, and left  popliteal vein. Unable to adequately interrogate the common femoral and higher, or the calf secondary to significant edema and body habitus   12/07/2017 [facility] Admission   She presented to the ER and was diagnosed with acute DVT   01/18/2018 - 01/21/2018 Hospital Admission   She was admitted to the hospital for management of severe persistent DVT   01/18/2018 Imaging   US venous Doppler Right: No evidence of common  femoral vein obstruction. Left: Findings consistent with acute deep vein thrombosis involving the left common femoral vein, and left popliteal vein.   01/19/2018 Surgery   Pre-operative Diagnosis: Subacute DVT with severe post thrombotic syndrome Post-operative diagnosis:  Same Surgeon:  Apolinar Junes C. Randie Heinz, MD Procedure Performed: 1.  Ultrasound-guided cannulation left small saphenous vein 2.  Left lower extremity and central venography 3.  Intravascular ultrasound of left popliteal, femoral, common femoral, external and common iliac veins and IVC 4.  Stent of left common and external iliac veins with 14 x 60 mm Vici 5.  Moderate sedation with fentanyl and Versed for 50 minutes   Indications: 77 year old female with a history of DVT in October now presents with persistent left lower extremity swelling and ultrasound demonstrating likely persistent DVT.  She has been on Xarelto at this time.  She is now indicated for venogram possible intervention.   Findings: Flow in the left lower extremity was stagnant throughout but by venogram all veins were patent.  There was a focal occlusive area approximately 2 cm in length at the common and external iliac vein junction at the hypogastric on the left.  After stenting and ballooning we had a diameter of 12 millimeters in the stent and venogram demonstrated flow in the lower extremity veins were previously was stagnant and no further residual stenosis in the left common and external iliac vein junction.   04/12/2018 Imaging   US Venous Doppler Right: No evidence of common femoral vein obstruction. Left: There is no evidence of deep vein thrombosis in the lower extremity. However, portions of this examination were limited- see technologist comments above. Left groin: Large hypoechoic area with mixed echoes noted measuring nearly 10 cm. Possible  hematoma versus unknown etiology. Ultrasound characteristics of enlarged lymph nodes noted in the groin.       05/15/2018 Imaging   US Venous Doppler Right: No evidence of deep vein thrombosis in the lower extremity. No indirect evidence of obstruction proximal to the inguinal ligament. Left: No reflux was noted in the common femoral vein , femoral vein in the thigh, popliteal vein, great saphenous vein at the saphenofemoral junction, great saphenous vein at the proximal thigh, great saphenous vein at the mid thigh, great saphenous vein  at the distal thigh, great saphenous vein at the knee, origin of the small saphenous vein, proximal small saphenous vein, and mid small saphenous vein. There is no evidence of deep vein thrombosis in the lower extremity. There is no evidence of superficial venous thrombosis. No cystic structure found in the popliteal fossa. Unable to evaluate extension of common femoral vein obstruction proximal to the inguinal ligament.   06/01/2018 Imaging   1. 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 Additionally, there is lucency involving the medial sidewall of the left acetabulum with potential nondisplaced pathologic fracture. Further evaluation with contrast-enhanced pelvic MRI could be performed as clinically indicated. 2. The left pelvic arterial and venous system is encased by this infiltrative left pelvic  sidewall mass however while difficult to ascertain, the left external iliac venous stent appears patent.   06/18/2018 Pathology Results   Lymph node for lymphoma, Left Inguinal - METASTATIC ADENOCARCINOMA, SEE COMMENT. Microscopic Comment Immunohistochemistry is positive for cytokeratin 7, PAX8, ER, and PR. Cytokeratin 5/6,and p63 are negative. The immunoprofile along with the patient's history are consistent with a gynecologic primary.   06/18/2018 Surgery   Pre-op Diagnosis: INGUINAL LYMPHADENOPATHY, PELVIC MASS      Procedure(s): EXCISIONAL BIOPSY DEEP LEFT INGUINAL LYMPH  NODE   Surgeon(s): Abigail Miyamoto, MD      06/24/2018 Cancer Staging   Staging form: Corpus Uteri - Carcinoma and Carcinosarcoma, AJCC 8th Edition - Clinical: Stage IVB (cT1a, cN2, pM1) - Signed by Artis Delay, MD on 06/24/2018    Genetic Testing   Patient has genetic testing done for MMR on pathology from 06/18/2018. Results revealed patient has the following mutation(s): MMR: abnormal   06/29/2018 Procedure   Placement of a subcutaneous port device. Catheter tip at the SVC and right atrium junction.    Genetic Testing   Patient has genetic testing done for MSI on pathology from 06/18/2018. Results revealed patient has the following mutation(s): MSI: High   07/02/2018 PET scan   Previous hysterectomy, with asymmetric focus of hypermetabolic activity in the left vaginal cuff, suspicious for residual or recurrent carcinoma.   Large hypermetabolic soft tissue mass involving the left pelvic sidewall and acetabulum, consistent with metastatic disease.   No evidence metastatic disease within the abdomen, chest, or neck.   07/09/2018 Tumor Marker   Patient's tumor was tested for the following markers: CA-125 Results of the tumor marker test revealed 9   07/10/2018 - 08/24/2018 Chemotherapy   The patient had carboplatin and taxol x 3 cycles   07/17/2018 Genetic Testing   Negative genetic testing on the common hereditary cancer panel.  The Common Hereditary Gene Panel offered by [facility] includes sequencing and/or deletion duplication testing of the following 48 genes: APC, ATM, AXIN2, BARD1, BMPR1A, BRCA1, BRCA2, BRIP1, CDH1, CDK4, CDKN2A (p14ARF), CDKN2A (p16INK4a), CHEK2, CTNNA1, DICER1, EPCAM (Deletion/duplication testing only), GREM1 (promoter region deletion/duplication testing only), KIT, MEN1, MLH1, MSH2, MSH3, MSH6, MUTYH, NBN, NF1, NHTL1, PALB2, PDGFRA, PMS2, POLD1, POLE, PTEN, RAD50, RAD51C, RAD51D, RNF43, SDHB, SDHC, SDHD, SMAD4, SMARCA4. STK11, TP53, TSC1, TSC2, and VHL.  The  following genes were evaluated for sequence changes only: SDHA and HOXB13 c.251G>A variant only. The report date is Jul 17, 2018.    10/03/2018 Imaging   CT abdomen and pelvis 1.  No acute intra-abdominal process. 2. Grossly unchanged left pelvic sidewall mass with osseous involvement of the medial acetabulum. Progressive mild displacement of the associated comminuted pathologic fracture involving the right acetabulum and puboacetabular junction.  3. New venous stents extending from the left common iliac vein origin to the proximal left common femoral vein. The stents are patent.   11/06/2018 -  Chemotherapy   The patient had pembrolizumab for chemotherapy treatment.     01/28/2019 Imaging   1. No substantial interval change in exam. 2. Interval development of mild fullness in the left intrarenal collecting system and ureter without overt hydronephrosis at this time. 3. Abnormal soft tissue along the left pelvic sidewall has decreased slightly in the interval. 4. Similar appearance of ill-defined fascial planes in the pelvis with some peritoneal thickening along the right pelvic sidewall and potentially involving the sigmoid mesocolon. 5. No substantial ascites.   05/03/2019 Imaging   1. Stable mild left pelvic sidewall soft tissue  density. No new or progressive disease identified within the abdomen or pelvis.  2. Colonic diverticulosis. No radiographic evidence of diverticulitis.   Aortic Atherosclerosis (ICD10-I70.0).   09/09/2019 Imaging   1. No change in appearance of soft tissue thickening along the LEFT pelvic sidewall adjacent to chronic LEFT acetabular fracture. 2. Mild asymmetry of the bladder wall favoring the LEFT bladder wall, not well assessed. Similar accounting for variable degrees of distension on prior studies potentially related to prior radiation, attention on follow-up. 3. Signs of venous stenting in the LEFT hemipelvis with LEFT lower extremity muscular atrophy and mild  stranding with similar appearance. Signs of colonic diverticulosis and diverticular disease without change.   02/24/2020 Imaging   1. Unchanged appearance of the pelvis as detailed below. 2. Unchanged soft tissue thickening of the left pelvic sidewall. 3. 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 4. No evidence discrete mass or lymphadenopathy nor metastatic disease in the abdomen or pelvis. 5. Status post hysterectomy and cholecystectomy. 6. Left common iliac vein stent. 7. Pancolonic diverticulosis.     09/07/2020 Imaging   Stable abnormal soft tissue density in the left pelvic sidewall. No new or progressive disease within the abdomen or pelvis.   Colonic diverticulosis. No radiographic evidence of diverticulitis.   Stable severe destructive left hip arthropathy with fracture involving the medial acetabular wall.     03/01/2021 Imaging   Stable abnormal soft tissue density in the left pelvic sidewall. 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   No new or progressive disease within the abdomen or pelvis.   Colonic diverticulosis, without radiographic evidence of diverticulitis.   Tiny hiatal hernia.   11/08/2021 Imaging   1. 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 2. Cannot exclude wall thickening in the lower rectum, correlate with digital rectal exam. 3. Other imaging findings of potential clinical significance: Small type 1 hiatal hernia. Colonic diverticulosis. Left common iliac vein and external iliac vein stent graft. 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 Diffuse idiopathic skeletal hyperostosis with multilevel foraminal impingement in the lumbar  spine. Small umbilical hernia contains adipose tissue.   05/13/2022 Imaging   Old/treated left acetabular metastasis with chronic adjacent pelvic sidewall soft tissue thickening. No evidence of recurrent or progressive metastatic disease.   10/29/2022 Imaging   CT ABDOMEN PELVIS W CONTRAST  Result Date: 10/30/2022 CLINICAL DATA:  High risk endometrial cancer; monitor. Evaluate for recurrence. * Tracking Code: BO * EXAM: CT ABDOMEN AND PELVIS WITH CONTRAST TECHNIQUE: Multidetector CT imaging of the abdomen and pelvis was performed using the standard protocol following bolus administration of intravenous contrast. RADIATION DOSE REDUCTION: This exam was performed according to the departmental dose-optimization program which includes automated exposure control, adjustment of the mA and/or kV according to patient size and/or use of iterative reconstruction technique. CONTRAST:  OMNIPAQUE IOHEXOL 300 MG/ML  SOLN COMPARISON:  Abdominopelvic CT 05/10/2022 and 11/06/2021. FINDINGS: Lower chest: Stable mild central airway thickening and lingular scarring. No confluent airspace opacity, suspicious pulmonary nodule or confluent airspace disease. Stable small hiatal hernia. Hepatobiliary: No focal hepatic abnormalities are identified. No evidence of biliary dilatation status post cholecystectomy. Pancreas: Unremarkable. No pancreatic ductal dilatation or surrounding inflammatory changes. Spleen: Normal in size without focal abnormality. Adrenals/Urinary Tract: Both adrenal glands  appear normal. No evidence of urinary tract calculus, suspicious renal lesion or hydronephrosis. The bladder appears unremarkable for its degree of distention. Stomach/Bowel: No enteric contrast administered. As above, stable small hiatal hernia. The stomach otherwise appears unremarkable for its degree of distention. No bowel distension, wall thickening or surrounding inflammation. The appendix appears normal. There is diffuse colonic  diverticulosis without evidence of acute inflammation. Vascular/Lymphatic: There are no enlarged abdominal or pelvic lymph nodes. No acute vascular findings. Minimal aortoiliac atherosclerosis. Previous stenting of the left common and external iliac veins. Reproductive: Status post hysterectomy. Low-density structure superior to the bladder on the right measuring 3.5 x 2.4 cm on image 57/3 is unchanged from the recent prior studies, although enlarged from older prior studies. No solid adnexal mass identified. Other: No ascites or peritoneal nodularity. No pneumoperitoneum. The abdominal wall appears intact. Stable mild subcutaneous edema. Musculoskeletal: No acute osseous findings or evidence of osseous metastatic disease. 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 Previous imaging has demonstrated a pathologic fracture in this area. There is stable chronic soft tissue thickening along the left pelvic sidewall. Multilevel spondylosis. Unless specific follow-up recommendations are mentioned in the findings or impression sections, no imaging follow-up of any mentioned incidental findings is recommended. IMPRESSION: 1. No definite evidence of local recurrence or metastatic disease in the abdomen or pelvis. 2. Indeterminate low-density structure superior to the bladder on the right, stable from recent prior studies, possibly a lymphocele or other benign finding. Correlate with tumor markers. Recommend attention on follow-up. 3. Diffuse colonic diverticulosis without evidence of acute inflammation. 4. Stable sequela of previously demonstrated pathologic left hip fracture with severe left hip arthropathy. 5.  Aortic Atherosclerosis (ICD10-I70.0). Electronically Signed   By: Carey Bullocks M.D.   On: 10/30/2022 15:57      Metastasis to lymph nodes (HCC)  06/23/2018 Initial Diagnosis   Metastasis to lymph nodes (HCC)   07/10/2018 - 08/24/2018 Chemotherapy   The patient had  palonosetron (ALOXI) injection 0.25 mg, 0.25 mg, Intravenous,  Once, 3 of 6 cycles Administration: 0.25 mg (07/10/2018), 0.25 mg (07/31/2018), 0.25 mg (08/24/2018) CARBOplatin (PARAPLATIN) 480 mg in sodium chloride 0.9 % 250 mL chemo infusion, 480 mg (100 % of original dose 482.5 mg), Intravenous,  Once, 3 of 6 cycles Dose modification: 482.5 mg (original dose 482.5 mg, Cycle 1) Administration: 480 mg (07/10/2018), 480 mg (07/31/2018), 480 mg (08/24/2018) PACLitaxel (TAXOL) 276 mg in sodium chloride 0.9 % 250 mL chemo infusion (> 80mg /m2), 140 mg/m2 = 276 mg (80 % of original dose 175 mg/m2), Intravenous,  Once, 3 of 6 cycles Dose modification: 140 mg/m2 (80 % of original dose 175 mg/m2, Cycle 1, Reason: Dose Not Tolerated) Administration: 276 mg (07/10/2018), 276 mg (07/31/2018), 276 mg (08/24/2018) fosaprepitant (EMEND) 150 mg, dexamethasone (DECADRON) 12 mg in sodium chloride 0.9 % 145 mL IVPB, , Intravenous,  Once, 3 of 6 cycles Administration:  (07/10/2018),  (07/31/2018),  (08/24/2018)  for chemotherapy treatment.    11/06/2018 -  Chemotherapy   The patient had pembrolizumab for chemotherapy treatment.     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 (HCC)  06/24/2018 Initial Diagnosis   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 (HCC)   07/10/2018 - 08/24/2018 Chemotherapy   The patient had palonosetron (ALOXI) injection 0.25 mg, 0.25 mg, Intravenous,  Once, 3 of 6 cycles Administration: 0.25 mg (07/10/2018), 0.25 mg (07/31/2018), 0.25 mg (08/24/2018) CARBOplatin (PARAPLATIN) 480 mg in sodium chloride 0.9 % 250 mL chemo infusion, 480 mg (100 % of original dose 482.5 mg),  Intravenous,  Once, 3 of 6 cycles Dose modification: 482.5 mg (original dose 482.5 mg, Cycle 1) Administration: 480 mg (07/10/2018), 480 mg (07/31/2018), 480 mg (08/24/2018) PACLitaxel (TAXOL) 276 mg in sodium chloride 0.9 % 250 mL chemo infusion (> 80mg /m2), 140 mg/m2 = 276 mg (80 % of original dose 175 mg/m2), Intravenous,  Once, 3 of 6 cycles Dose modification: 140 mg/m2 (80 % of original  dose 175 mg/m2, Cycle 1, Reason: Dose Not Tolerated) Administration: 276 mg (07/10/2018), 276 mg (07/31/2018), 276 mg (08/24/2018) fosaprepitant (EMEND) 150 mg, dexamethasone (DECADRON) 12 mg in sodium chloride 0.9 % 145 mL IVPB, , Intravenous,  Once, 3 of 6 cycles Administration:  (07/10/2018),  (07/31/2018),  (08/24/2018)  for chemotherapy treatment.    11/06/2018 -  Chemotherapy   The patient had pembrolizumab for chemotherapy treatment.     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       PHYSICAL EXAMINATION: ECOG PERFORMANCE STATUS: 2 - Symptomatic, <50% confined to bed  Vitals:   10/31/22 0955  BP: (!) 146/64  Pulse: 89  Resp: 18  SpO2: 99%   There were no vitals filed for this visit.  GENERAL:alert, no distress and comfortable  LABORATORY DATA:  I have reviewed the data as listed    Component Value Date/Time   NA 140 05/10/2022 0947   K 4.0 05/10/2022 0947   CL 107 05/10/2022 0947   CO2 27 05/10/2022 0947   GLUCOSE 100 (H) 05/10/2022 0947   BUN 12 05/10/2022 0947   CREATININE 0.71 05/10/2022 0947   CREATININE 0.57 11/30/2020 1115   CALCIUM 9.2 05/10/2022 0947   PROT 7.4 05/10/2022 0947   ALBUMIN 4.1 05/10/2022 0947   AST 11 (L) 05/10/2022 0947   AST 13 (L) 11/30/2020 1115   ALT 9 05/10/2022 0947   ALT 9 11/30/2020 1115   ALKPHOS 91 05/10/2022 0947   BILITOT 0.4 05/10/2022 0947   BILITOT 0.8 11/30/2020 1115   GFRNONAA >60 05/10/2022 0947   GFRNONAA >60 11/30/2020 1115   GFRAA >60 11/12/2019 1222    No results found for: "SPEP", "UPEP"  Lab Results  Component Value Date   WBC 3.5 (L) 05/10/2022   NEUTROABS 2.0 05/10/2022   HGB 11.4 (L) 05/10/2022   HCT 34.1 (L) 05/10/2022   MCV 87.0 05/10/2022   PLT 214  05/10/2022      Chemistry      Component Value Date/Time   NA 140 05/10/2022 0947   K 4.0 05/10/2022 0947   CL 107 05/10/2022 0947   CO2 27 05/10/2022 0947   BUN 12 05/10/2022 0947   CREATININE 0.71 05/10/2022 0947   CREATININE 0.57 11/30/2020 1115      Component Value Date/Time   CALCIUM 9.2 05/10/2022 0947   ALKPHOS 91 05/10/2022 0947   AST 11 (L) 05/10/2022 0947   AST 13 (L) 11/30/2020 1115   ALT 9 05/10/2022 0947   ALT 9 11/30/2020 1115   BILITOT 0.4 05/10/2022 0947   BILITOT 0.8 11/30/2020 1115       RADIOGRAPHIC STUDIES: I have reviewed imaging study with the patient I have personally reviewed the radiological images as listed and agreed with the findings in the report. CT ABDOMEN PELVIS W CONTRAST  Result Date: 10/30/2022 CLINICAL DATA:  High risk endometrial cancer; monitor. Evaluate for recurrence. *  Tracking Code: BO * EXAM: CT ABDOMEN AND PELVIS WITH CONTRAST TECHNIQUE: Multidetector CT imaging of the abdomen and pelvis was performed using the standard protocol following bolus administration of intravenous contrast. RADIATION DOSE REDUCTION: This exam was performed according to the departmental dose-optimization program which includes automated exposure control, adjustment of the mA and/or kV according to patient size and/or use of iterative reconstruction technique. CONTRAST:  OMNIPAQUE IOHEXOL 300 MG/ML  SOLN COMPARISON:  Abdominopelvic CT 05/10/2022 and 11/06/2021. FINDINGS: Lower chest: Stable mild central airway thickening and lingular scarring. No confluent airspace opacity, suspicious pulmonary nodule or confluent airspace disease. Stable small hiatal hernia. Hepatobiliary: No focal hepatic abnormalities are identified. No evidence of biliary dilatation status post cholecystectomy. Pancreas: Unremarkable. No pancreatic ductal dilatation or surrounding inflammatory changes. Spleen: Normal in size without focal abnormality. Adrenals/Urinary Tract: Both adrenal  glands appear normal. No evidence of urinary tract calculus, suspicious renal lesion or hydronephrosis. The bladder appears unremarkable for its degree of distention. Stomach/Bowel: No enteric contrast administered. As above, stable small hiatal hernia. The stomach otherwise appears unremarkable for its degree of distention. No bowel distension, wall thickening or surrounding inflammation. The appendix appears normal. There is diffuse colonic diverticulosis without evidence of acute inflammation. Vascular/Lymphatic: There are no enlarged abdominal or pelvic lymph nodes. No acute vascular findings. Minimal aortoiliac atherosclerosis. Previous stenting of the left common and external iliac veins. Reproductive: Status post hysterectomy. Low-density structure superior to the bladder on the right measuring 3.5 x 2.4 cm on image 57/3 is unchanged from the recent prior studies, although enlarged from older prior studies. No solid adnexal mass identified. Other: No ascites or peritoneal nodularity. No pneumoperitoneum. The abdominal wall appears intact. Stable mild subcutaneous edema. Musculoskeletal: No acute osseous findings or evidence of osseous metastatic disease. 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 Previous imaging has demonstrated a pathologic fracture in this area. There is stable chronic soft tissue thickening along the left pelvic sidewall. Multilevel spondylosis. Unless specific follow-up recommendations are mentioned in the findings or impression sections, no imaging follow-up of any mentioned incidental findings is recommended. IMPRESSION: 1. No definite evidence of local recurrence or metastatic disease in the abdomen or pelvis. 2. Indeterminate low-density structure superior to the bladder on the right, stable from recent prior studies, possibly a lymphocele or other benign finding. Correlate with tumor markers. Recommend attention on follow-up. 3. Diffuse  colonic diverticulosis without evidence of acute inflammation. 4. Stable sequela of previously demonstrated pathologic left hip fracture with severe left hip arthropathy. 5.  Aortic Atherosclerosis (ICD10-I70.0). Electronically Signed   By: Carey Bullocks M.D.   On: 10/30/2022 15:57

## 2022-10-31 NOTE — Assessment & Plan Note (Signed)
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

## 2022-10-31 NOTE — Assessment & Plan Note (Signed)
She is taking both anticoagulation therapy and antiplatelet agent She has no recent bleeding She will continue her medications as directed  

## 2022-11-01 ENCOUNTER — Ambulatory Visit (HOSPITAL_BASED_OUTPATIENT_CLINIC_OR_DEPARTMENT_OTHER): Payer: 59 | Admitting: Internal Medicine

## 2022-12-17 DIAGNOSIS — H6123 Impacted cerumen, bilateral: Secondary | ICD-10-CM | POA: Insufficient documentation

## 2022-12-24 ENCOUNTER — Inpatient Hospital Stay: Payer: 59 | Attending: Hematology and Oncology

## 2022-12-24 DIAGNOSIS — C55 Malignant neoplasm of uterus, part unspecified: Secondary | ICD-10-CM

## 2022-12-24 DIAGNOSIS — Z9221 Personal history of antineoplastic chemotherapy: Secondary | ICD-10-CM | POA: Diagnosis not present

## 2022-12-24 DIAGNOSIS — Z8542 Personal history of malignant neoplasm of other parts of uterus: Secondary | ICD-10-CM | POA: Insufficient documentation

## 2022-12-24 DIAGNOSIS — Z452 Encounter for adjustment and management of vascular access device: Secondary | ICD-10-CM | POA: Diagnosis not present

## 2022-12-24 DIAGNOSIS — Z923 Personal history of irradiation: Secondary | ICD-10-CM | POA: Insufficient documentation

## 2022-12-24 MED ORDER — HEPARIN SOD (PORK) LOCK FLUSH 100 UNIT/ML IV SOLN
500.0000 [IU] | Freq: Once | INTRAVENOUS | Status: AC
  Administered 2022-12-24: 500 [IU]

## 2022-12-24 MED ORDER — SODIUM CHLORIDE 0.9% FLUSH
10.0000 mL | Freq: Once | INTRAVENOUS | Status: AC
  Administered 2022-12-24: 10 mL

## 2022-12-26 ENCOUNTER — Other Ambulatory Visit: Payer: Self-pay | Admitting: Family Medicine

## 2022-12-26 ENCOUNTER — Ambulatory Visit
Admission: RE | Admit: 2022-12-26 | Discharge: 2022-12-26 | Disposition: A | Discharge status: Home / Self Care | Payer: 59 | Source: Ambulatory Visit | Attending: Family Medicine | Admitting: Family Medicine

## 2022-12-26 DIAGNOSIS — M79644 Pain in right finger(s): Secondary | ICD-10-CM

## 2023-01-01 ENCOUNTER — Encounter: Payer: Self-pay | Admitting: Psychiatry

## 2023-01-06 ENCOUNTER — Inpatient Hospital Stay (HOSPITAL_BASED_OUTPATIENT_CLINIC_OR_DEPARTMENT_OTHER): Payer: 59 | Admitting: Psychiatry

## 2023-01-06 ENCOUNTER — Encounter: Payer: Self-pay | Admitting: Psychiatry

## 2023-01-06 VITALS — BP 131/66 | HR 72 | Temp 98.0°F | Resp 20 | Wt 231.6 lb

## 2023-01-06 DIAGNOSIS — C541 Malignant neoplasm of endometrium: Secondary | ICD-10-CM

## 2023-01-06 DIAGNOSIS — Z8542 Personal history of malignant neoplasm of other parts of uterus: Secondary | ICD-10-CM

## 2023-01-06 NOTE — Patient Instructions (Signed)
It was a pleasure to see you in clinic today. - Exam normal - Return visit planned for  June  Thank you very much for allowing me to provide care for you today.  I appreciate your confidence in choosing our Gynecologic Oncology team at [facility].  If you have any questions about your visit today please call our office or send [place] a MyChart message and we will get back to you as soon as possible.

## 2023-01-06 NOTE — Progress Notes (Signed)
Gynecologic Oncology Return Clinic Visit  Date of Service: 01/06/2023 Referring Provider: Artis Delay, MD  Assessment & Plan: [name] is a 77 y.o. 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  Now currently off of treatment.  She presents for endometrial cancer surveillance.  Recurrent endometrial cancer: - Clinically NED on exam today. - Last CT on 10/30/22 NED - Signs and symptoms of recurrence reviewed with patient. - Discussed surveillance every 3 months initially. - Dr. Bertis Ruddy planning follow-up and repeat scan 57months from prior.    RTC 57mo (alt q61mo with Dr. Bertis Ruddy).  Clide Cliff, MD Gynecologic Oncology   Medical Decision Making I personally spent  TOTAL 20 minutes face-to-face and non-face-to-face in the care of this patient, which includes all pre, intra, and post visit time on the date of service.   ----------------------- Reason for Visit: Surveillance  Treatment History: Oncology History Overview Note  Hx of endometrioid cancer in 2012 (FIGO grade II, [id number]), recurrent disease in 2020 MMR: abnormal MSI: High Genetics are negative   Uterine cancer (HCC)  07/03/2010 Pathology Results   1. Uterus +/- tubes/ovaries, neoplastic, with left fallopian tube and ovary - INVASIVE ENDOMETRIOID CARCINOMA (1.5 CM), FIGO GRADE II, ARISING IN A BACKGROUND OF ATYPICAL COMPLEX HYPERPLASIA, CONFINED WITHIN INNER HALF OF THE MYOMETRIUM. - ENDOMETRIAL POLYP WITH ASSOCIATED ATYPICAL COMPLEX HYPERPLASIA. - MYOMETRIUM: LEIOMYOMATA. - CERVIX: BENIGN SQUAMOUS MUCOSA AND ENDOCERVICAL MUCOSA, NO DYSPLASIA OR MALIGNANCY. - LEFT OVARY: BENIGN OVARIAN TISSUE WITH ENDOSALPINGOSIS, NO EVIDENCE OF ATYPIA OR MALIGNANCY. - LEFT FALLOPIAN TUBE: NO HISTOLOGIC ABNORMALITIES. - PLEASE SEE ONCOLOGY TEMPLATE FOR DETAIL. 2. Ovary and  fallopian tube, right - BENIGN OVARIAN TISSUE WITH ENDOSALPINGOSIS, NO ATYPIA OR MALIGNANCY. - BENIGN FALLOPIAN TUBAL TISSUE, NO PATHOLOGIC ABNORMALITIES. Microscopic Comment 1. UTERUS Specimen: Uterus, cervix, bilateral ovaries and fallopian tubes Procedure: Total hysterectomy and bilateral salpingo-oophorectomy Lymph node sampling performed: No Specimen integrity: Intact Maximum tumor size (cm): 1.5 cm, glass slide measurement Histologic type: Invasive endometrioid carcinoma Grade: FIGO grade II Myometrial invasion: 1 cm where myometrium is 2.3 cm in thickness Cervical stromal involvement: No Extent of involvement of other organs: No Lymph vascular invasion: Not identified Peritoneal washings: Negative ([id number]) Lymph nodes: number examined N/A; number positive N/A TNM code: pT1a, pNX 1 oFf 3IGO Stage (based on pathologic findings, needs clinical correlation): IA  Comments: Sections the endomyometrium away from the grossly identified endometrial polyp show an invasive FIGO grade II endometrioid carcinoma. The tumor is confined within inner half of the myometrium. No angiolymphatic invasion is identified. No cervical stromal involvement is identified. Sections of the grossly identified endometrial polyp show an endometrial polyp with associated atypical compacted hyperplasia with no definitive evidence of carcinoma.   12/07/2017 Imaging   US venous Doppler Right: No evidence of common femoral vein obstruction. Left: Findings consistent with acute deep vein thrombosis involving the left femoral vein, left proximal profunda vein, and left popliteal vein. Unable to adequately interrogate the common femoral and higher, or the calf secondary to significant edema and body habitus   12/07/2017 [facility] Admission   She presented to the ER and was diagnosed with acute DVT   01/18/2018 - 01/21/2018 Hospital Admission   She was admitted to the hospital for management of severe persistent  DVT   01/18/2018 Imaging   US venous Doppler Right: No evidence of common femoral vein obstruction. Left: Findings  consistent with acute deep vein thrombosis involving the left common femoral vein, and left popliteal vein.   01/19/2018 Surgery   Pre-operative Diagnosis: Subacute DVT with severe post thrombotic syndrome Post-operative diagnosis:  Same Surgeon:  Apolinar Junes C. Randie Heinz, MD Procedure Performed: 1.  Ultrasound-guided cannulation left small saphenous vein 2.  Left lower extremity and central venography 3.  Intravascular ultrasound of left popliteal, femoral, common femoral, external and common iliac veins and IVC 4.  Stent of left common and external iliac veins with 14 x 60 mm Vici 5.  Moderate sedation with fentanyl and Versed for 50 minutes   Indications: 77 year old female with a history of DVT in October now presents with persistent left lower extremity swelling and ultrasound demonstrating likely persistent DVT.  She has been on Xarelto at this time.  She is now indicated for venogram possible intervention.   Findings: Flow in the left lower extremity was stagnant throughout but by venogram all veins were patent.  There was a focal occlusive area approximately 2 cm in length at the common and external iliac vein junction at the hypogastric on the left.  After stenting and ballooning we had a diameter of 12 millimeters in the stent and venogram demonstrated flow in the lower extremity veins were previously was stagnant and no further residual stenosis in the left common and external iliac vein junction.   04/12/2018 Imaging   US Venous Doppler Right: No evidence of common femoral vein obstruction. Left: There is no evidence of deep vein thrombosis in the lower extremity. However, portions of this examination were limited- see technologist comments above. Left groin: Large hypoechoic area with mixed echoes noted measuring nearly 10 cm. Possible  hematoma versus unknown etiology.  Ultrasound characteristics of enlarged lymph nodes noted in the groin.      05/15/2018 Imaging   US Venous Doppler Right: No evidence of deep vein thrombosis in the lower extremity. No indirect evidence of obstruction proximal to the inguinal ligament. Left: No reflux was noted in the common femoral vein , femoral vein in the thigh, popliteal vein, great saphenous vein at the saphenofemoral junction, great saphenous vein at the proximal thigh, great saphenous vein at the mid thigh, great saphenous vein  at the distal thigh, great saphenous vein at the knee, origin of the small saphenous vein, proximal small saphenous vein, and mid small saphenous vein. There is no evidence of deep vein thrombosis in the lower extremity. There is no evidence of superficial venous thrombosis. No cystic structure found in the popliteal fossa. Unable to evaluate extension of common femoral vein obstruction proximal to the inguinal ligament.   06/01/2018 Imaging   1. 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 Additionally, there is lucency involving the medial sidewall of the left acetabulum with potential nondisplaced pathologic fracture. Further evaluation with contrast-enhanced pelvic MRI could be performed as clinically indicated. 2. The left pelvic arterial and venous system is encased by this infiltrative left pelvic sidewall mass however while difficult to ascertain, the left external iliac venous stent appears patent.   06/18/2018 Pathology Results   Lymph node for lymphoma, Left Inguinal - METASTATIC ADENOCARCINOMA, SEE COMMENT. Microscopic Comment Immunohistochemistry is positive for cytokeratin 7, PAX8, ER, and PR. Cytokeratin 5/6,and p63 are negative. The immunoprofile along with the patient's history are consistent with a gynecologic primary.   06/18/2018 Surgery   Pre-op Diagnosis: INGUINAL  LYMPHADENOPATHY, PELVIC MASS      Procedure(s):  EXCISIONAL BIOPSY DEEP LEFT INGUINAL LYMPH NODE   Surgeon(s): Abigail Miyamoto, MD      06/24/2018 Cancer Staging   Staging form: Corpus Uteri - Carcinoma and Carcinosarcoma, AJCC 8th Edition - Clinical: Stage IVB (cT1a, cN2, pM1) - Signed by Artis Delay, MD on 06/24/2018    Genetic Testing   Patient has genetic testing done for MMR on pathology from 06/18/2018. Results revealed patient has the following mutation(s): MMR: abnormal   06/29/2018 Procedure   Placement of a subcutaneous port device. Catheter tip at the SVC and right atrium junction.    Genetic Testing   Patient has genetic testing done for MSI on pathology from 06/18/2018. Results revealed patient has the following mutation(s): MSI: High   07/02/2018 PET scan   Previous hysterectomy, with asymmetric focus of hypermetabolic activity in the left vaginal cuff, suspicious for residual or recurrent carcinoma.   Large hypermetabolic soft tissue mass involving the left pelvic sidewall and acetabulum, consistent with metastatic disease.   No evidence metastatic disease within the abdomen, chest, or neck.   07/09/2018 Tumor Marker   Patient's tumor was tested for the following markers: CA-125 Results of the tumor marker test revealed 9   07/10/2018 - 08/24/2018 Chemotherapy   The patient had carboplatin and taxol x 3 cycles   07/17/2018 Genetic Testing   Negative genetic testing on the common hereditary cancer panel.  The Common Hereditary Gene Panel offered by [facility] includes sequencing and/or deletion duplication testing of the following 48 genes: APC, ATM, AXIN2, BARD1, BMPR1A, BRCA1, BRCA2, BRIP1, CDH1, CDK4, CDKN2A (p14ARF), CDKN2A (p16INK4a), CHEK2, CTNNA1, DICER1, EPCAM (Deletion/duplication testing only), GREM1 (promoter region deletion/duplication testing only), KIT, MEN1, MLH1, MSH2, MSH3, MSH6, MUTYH, NBN, NF1, NHTL1, PALB2, PDGFRA, PMS2, POLD1, POLE, PTEN, RAD50, RAD51C,  RAD51D, RNF43, SDHB, SDHC, SDHD, SMAD4, SMARCA4. STK11, TP53, TSC1, TSC2, and VHL.  The following genes were evaluated for sequence changes only: SDHA and HOXB13 c.251G>A variant only. The report date is Jul 17, 2018.    10/03/2018 Imaging   CT abdomen and pelvis 1.  No acute intra-abdominal process. 2. Grossly unchanged left pelvic sidewall mass with osseous involvement of the medial acetabulum. Progressive mild displacement of the associated comminuted pathologic fracture involving the right acetabulum and puboacetabular junction.  3. New venous stents extending from the left common iliac vein origin to the proximal left common femoral vein. The stents are patent.   11/06/2018 -  Chemotherapy   The patient had pembrolizumab for chemotherapy treatment.     01/28/2019 Imaging   1. No substantial interval change in exam. 2. Interval development of mild fullness in the left intrarenal collecting system and ureter without overt hydronephrosis at this time. 3. Abnormal soft tissue along the left pelvic sidewall has decreased slightly in the interval. 4. Similar appearance of ill-defined fascial planes in the pelvis with some peritoneal thickening along the right pelvic sidewall and potentially involving the sigmoid mesocolon. 5. No substantial ascites.   05/03/2019 Imaging   1. Stable mild left pelvic sidewall soft tissue density. No new or progressive disease identified within the abdomen or pelvis.  2. Colonic diverticulosis. No radiographic evidence of diverticulitis.   Aortic Atherosclerosis (ICD10-I70.0).   09/09/2019 Imaging   1. No change in appearance of soft tissue thickening along the LEFT pelvic sidewall adjacent to chronic LEFT acetabular fracture. 2. Mild asymmetry of the bladder wall favoring the LEFT bladder wall, not well assessed. Similar accounting for variable degrees of distension on prior studies potentially related to prior radiation,  attention on follow-up. 3. Signs of  venous stenting in the LEFT hemipelvis with LEFT lower extremity muscular atrophy and mild stranding with similar appearance. Signs of colonic diverticulosis and diverticular disease without change.   02/24/2020 Imaging   1. Unchanged appearance of the pelvis as detailed below. 2. Unchanged soft tissue thickening of the left pelvic sidewall. 3. 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 4. No evidence discrete mass or lymphadenopathy nor metastatic disease in the abdomen or pelvis. 5. Status post hysterectomy and cholecystectomy. 6. Left common iliac vein stent. 7. Pancolonic diverticulosis.     09/07/2020 Imaging   Stable abnormal soft tissue density in the left pelvic sidewall. No new or progressive disease within the abdomen or pelvis.   Colonic diverticulosis. No radiographic evidence of diverticulitis.   Stable severe destructive left hip arthropathy with fracture involving the medial acetabular wall.     03/01/2021 Imaging   Stable abnormal soft tissue density in the left pelvic sidewall. 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   No new or progressive disease within the abdomen or pelvis.   Colonic diverticulosis, without radiographic evidence of diverticulitis.   Tiny hiatal hernia.   11/08/2021 Imaging   1. 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 2. Cannot exclude wall thickening in the lower rectum, correlate with digital rectal exam. 3. Other imaging findings of potential clinical significance: Small type 1 hiatal hernia. Colonic diverticulosis. Left common iliac vein and external iliac vein stent graft. 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  Diffuse idiopathic skeletal hyperostosis with multilevel foraminal impingement in the lumbar spine. Small umbilical hernia contains adipose tissue.   05/13/2022 Imaging   Old/treated left acetabular metastasis with chronic adjacent pelvic sidewall soft tissue thickening. No evidence of recurrent or progressive metastatic disease.   10/29/2022 Imaging   CT ABDOMEN PELVIS W CONTRAST  Result Date: 10/30/2022 CLINICAL DATA:  High risk endometrial cancer; monitor. Evaluate for recurrence. * Tracking Code: BO * EXAM: CT ABDOMEN AND PELVIS WITH CONTRAST TECHNIQUE: Multidetector CT imaging of the abdomen and pelvis was performed using the standard protocol following bolus administration of intravenous contrast. RADIATION DOSE REDUCTION: This exam was performed according to the departmental dose-optimization program which includes automated exposure control, adjustment of the mA and/or kV according to patient size and/or use of iterative reconstruction technique. CONTRAST:  OMNIPAQUE IOHEXOL 300 MG/ML  SOLN COMPARISON:  Abdominopelvic CT 05/10/2022 and 11/06/2021. FINDINGS: Lower chest: Stable mild central airway thickening and lingular scarring. No confluent airspace opacity, suspicious pulmonary nodule or confluent airspace disease. Stable small hiatal hernia. Hepatobiliary: No focal hepatic abnormalities are identified. No evidence of biliary dilatation status post cholecystectomy. Pancreas: Unremarkable. No pancreatic ductal dilatation or surrounding inflammatory changes. Spleen: Normal in size without focal abnormality. Adrenals/Urinary Tract: Both adrenal glands appear normal. No evidence of urinary tract calculus, suspicious renal lesion or hydronephrosis. The bladder appears unremarkable for its degree of distention. Stomach/Bowel: No enteric contrast administered. As above, stable small hiatal hernia. The stomach otherwise appears unremarkable for its degree of distention. No bowel distension, wall  thickening or surrounding inflammation. The appendix appears normal. There is diffuse colonic diverticulosis without evidence of acute inflammation. Vascular/Lymphatic: There are no enlarged abdominal or pelvic lymph nodes. No acute vascular findings. Minimal aortoiliac  atherosclerosis. Previous stenting of the left common and external iliac veins. Reproductive: Status post hysterectomy. Low-density structure superior to the bladder on the right measuring 3.5 x 2.4 cm on image 57/3 is unchanged from the recent prior studies, although enlarged from older prior studies. No solid adnexal mass identified. Other: No ascites or peritoneal nodularity. No pneumoperitoneum. The abdominal wall appears intact. Stable mild subcutaneous edema. Musculoskeletal: No acute osseous findings or evidence of osseous metastatic disease. 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 Previous imaging has demonstrated a pathologic fracture in this area. There is stable chronic soft tissue thickening along the left pelvic sidewall. Multilevel spondylosis. Unless specific follow-up recommendations are mentioned in the findings or impression sections, no imaging follow-up of any mentioned incidental findings is recommended. IMPRESSION: 1. No definite evidence of local recurrence or metastatic disease in the abdomen or pelvis. 2. Indeterminate low-density structure superior to the bladder on the right, stable from recent prior studies, possibly a lymphocele or other benign finding. Correlate with tumor markers. Recommend attention on follow-up. 3. Diffuse colonic diverticulosis without evidence of acute inflammation. 4. Stable sequela of previously demonstrated pathologic left hip fracture with severe left hip arthropathy. 5.  Aortic Atherosclerosis (ICD10-I70.0). Electronically Signed   By: Carey Bullocks M.D.   On: 10/30/2022 15:57      Metastasis to lymph nodes (HCC)  06/23/2018 Initial Diagnosis    Metastasis to lymph nodes (HCC)   07/10/2018 - 08/24/2018 Chemotherapy   The patient had palonosetron (ALOXI) injection 0.25 mg, 0.25 mg, Intravenous,  Once, 3 of 6 cycles Administration: 0.25 mg (07/10/2018), 0.25 mg (07/31/2018), 0.25 mg (08/24/2018) CARBOplatin (PARAPLATIN) 480 mg in sodium chloride 0.9 % 250 mL chemo infusion, 480 mg (100 % of original dose 482.5 mg), Intravenous,  Once, 3 of 6 cycles Dose modification: 482.5 mg (original dose 482.5 mg, Cycle 1) Administration: 480 mg (07/10/2018), 480 mg (07/31/2018), 480 mg (08/24/2018) PACLitaxel (TAXOL) 276 mg in sodium chloride 0.9 % 250 mL chemo infusion (> 80mg /m2), 140 mg/m2 = 276 mg (80 % of original dose 175 mg/m2), Intravenous,  Once, 3 of 6 cycles Dose modification: 140 mg/m2 (80 % of original dose 175 mg/m2, Cycle 1, Reason: Dose Not Tolerated) Administration: 276 mg (07/10/2018), 276 mg (07/31/2018), 276 mg (08/24/2018) fosaprepitant (EMEND) 150 mg, dexamethasone (DECADRON) 12 mg in sodium chloride 0.9 % 145 mL IVPB, , Intravenous,  Once, 3 of 6 cycles Administration:  (07/10/2018),  (07/31/2018),  (08/24/2018)  for chemotherapy treatment.    11/06/2018 -  Chemotherapy   The patient had pembrolizumab for chemotherapy treatment.     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 (HCC)  06/24/2018 Initial Diagnosis   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 (HCC)   07/10/2018 - 08/24/2018 Chemotherapy   The patient had palonosetron (ALOXI) injection 0.25 mg, 0.25 mg, Intravenous,  Once, 3 of 6 cycles Administration: 0.25 mg (07/10/2018), 0.25 mg (07/31/2018), 0.25 mg (08/24/2018) CARBOplatin (PARAPLATIN) 480 mg in sodium chloride 0.9 % 250 mL chemo infusion, 480 mg (100 % of original dose 482.5 mg), Intravenous,  Once, 3 of 6 cycles Dose modification: 482.5 mg (original dose 482.5 mg, Cycle 1) Administration: 480 mg (07/10/2018), 480 mg (07/31/2018), 480 mg (08/24/2018) PACLitaxel (TAXOL) 276 mg in sodium chloride 0.9 % 250 mL chemo infusion (> 80mg /m2), 140 mg/m2 = 276 mg (80 % of original dose 175  mg/m2), Intravenous,  Once, 3 of 6 cycles Dose modification: 140 mg/m2 (80 % of original dose 175 mg/m2, Cycle 1, Reason: Dose Not Tolerated) Administration: 276 mg (  07/10/2018), 276 mg (07/31/2018), 276 mg (08/24/2018) fosaprepitant (EMEND) 150 mg, dexamethasone (DECADRON) 12 mg in sodium chloride 0.9 % 145 mL IVPB, , Intravenous,  Once, 3 of 6 cycles Administration:  (07/10/2018),  (07/31/2018),  (08/24/2018)  for chemotherapy treatment.    11/06/2018 -  Chemotherapy   The patient had pembrolizumab for chemotherapy treatment.     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       Interval History: Patient reports an episode of weakness and vomiting on Saturday for which she called EMS.  She has been doing better since that time, a little weak but not as bad.  She otherwise denies vaginal bleeding, abdominal/pelvic pain, unintentional weight loss, change in bowel or bladder habits, early satiety, bloating. Leg has healed.  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   Primary localized osteoarthritis of right knee    Uterine cancer (HCC) 06/23/2018    Past Surgical History:  Procedure Laterality Date   ABDOMINAL HYSTERECTOMY  2012   CHOLECYSTECTOMY  N/A 03/09/2013   Procedure: LAPAROSCOPIC CHOLECYSTECTOMY;  Surgeon: Atilano Ina, MD;  Location: [facility] OR;  Service: General;  Laterality: N/A;   COLONOSCOPY W/ BIOPSIES     IR IMAGING GUIDED PORT INSERTION  06/29/2018   LARYNGOSCOPY Left 03/14/2017   Procedure: LARYNGOSCOPY;  Surgeon: Graylin Shiver, MD;  Location: [facility] OR;  Service: ENT;  Laterality: Left;   LOWER EXTREMITY VENOGRAPHY Left 01/19/2018   Procedure: LOWER EXTREMITY VENOGRAPHY;  Surgeon: Maeola Harman, MD;  Location: [facility] INVASIVE CV LAB;  Service: Cardiovascular;  Laterality: Left;   LOWER EXTREMITY VENOGRAPHY N/A 09/08/2018   Procedure: LOWER EXTREMITY VENOGRAPHY;  Surgeon: Maeola Harman, MD;  Location: [facility] INVASIVE CV LAB;  Service: Cardiovascular;  Laterality: N/A;   LYMPH NODE BIOPSY Left 06/18/2018   Procedure: EXCISIONAL BIOPSY LEFT INGUINAL LYMPH NODE;  Surgeon: Abigail Miyamoto, MD;  Location: [facility] OR;  Service: General;  Laterality: Left;   PERIPHERAL VASCULAR INTERVENTION Left 01/19/2018   Procedure: PERIPHERAL VASCULAR INTERVENTION;  Surgeon: Maeola Harman, MD;  Location: [facility] INVASIVE CV LAB;  Service: Cardiovascular;  Laterality: Left;  LEFT ILIAC VENOUS   PERIPHERAL VASCULAR INTERVENTION Left 09/08/2018   Procedure: PERIPHERAL VASCULAR INTERVENTION;  Surgeon: Maeola Harman, MD;  Location: [facility] INVASIVE CV LAB;  Service: Cardiovascular;  Laterality: Left;  lower extremity   TOTAL KNEE ARTHROPLASTY  04/29/2011   Procedure: TOTAL KNEE ARTHROPLASTY;  Surgeon: Nilda Simmer, MD;  Location: [facility] OR;  Service: Orthopedics;  Laterality: Left;  DR Thurston Hole WANTS 90 MINUTES FOR THIS CASE   TOTAL KNEE ARTHROPLASTY Right 07/04/2014   Procedure: TOTAL KNEE ARTHROPLASTY;  Surgeon: Salvatore Marvel, MD;  Location: [facility] OR;  Service: Orthopedics;  Laterality: Right;    Family History  Problem Relation Age of Onset   Arthritis Mother    Hypertension Mother    Alzheimer's disease Father    Diabetes  Sister    Hypertension Sister    Hypertension Sister    Hypertension Sister    Hypertension Sister    Hypertension Brother    Stroke Brother    Hypertension Brother    Breast cancer Niece 27       sister's daughter   Breast cancer Other        Niece   Anesthesia problems Neg Hx    Hypotension Neg Hx    Malignant hyperthermia Neg Hx    Pseudochol deficiency Neg Hx    Colon cancer Neg Hx    Ovarian cancer Neg Hx    Endometrial cancer Neg Hx    Pancreatic cancer Neg Hx    Prostate cancer Neg Hx     Social History   Socioeconomic History   Marital status: Divorced    Spouse name: Not on file   Number of children: 1   Years of education: Not on file   Highest education level: Not on file  Occupational History   Occupation: Retired   Tobacco Use   Smoking status: Former    Current packs/day: 0.00    Types: Cigarettes    Start date: 04/23/1987    Quit date: 04/22/1988    Years since quitting: 34.7   Smokeless tobacco: Never  Vaping Use   Vaping status: Never Used  Substance and Sexual Activity   Alcohol use: No   Drug use: No   Sexual activity: Not Currently    Birth control/protection: Surgical  Other Topics Concern   Not on file  Social History Narrative   Daily caffeine    Social Determinants of Health   Financial Resource Strain: Not on file  Food Insecurity: Not on file  Transportation Needs: Not on file  Physical Activity: Not on file  Stress: Not on file  Social Connections: Not on file    Current Medications:  Current Outpatient Medications:    acetaminophen (TYLENOL) 325 MG tablet, Take by mouth., Disp: , Rfl:    apixaban (ELIQUIS) 2.5 MG TABS tablet, Take by mouth 2 (two) times daily., Disp: , Rfl:    diclofenac sodium (VOLTAREN) 1 % GEL, APPLY 4GRAMS 4 TIMES A DAY AS NEEDED FOR PAINS, Disp: , Rfl:    furosemide (LASIX) 40 MG tablet, Take 40 mg by mouth daily as needed., Disp: , Rfl:    gabapentin (NEURONTIN) 300 MG capsule, Take 300 mg by mouth 3  (three) times daily., Disp: , Rfl:    lidocaine-prilocaine (EMLA) cream, Apply 1 application topically daily as needed., Disp: 30 g, Rfl: 3   methadone (DOLOPHINE) 10 MG tablet, Take 1 tablet (10 mg total) by mouth every 12 (twelve) hours., Disp: 60 tablet, Rfl: 0   Olopatadine HCl 0.2 % SOLN, Place 1 drop into both eyes daily., Disp: , Rfl:    oxyCODONE 10 MG TABS, Take 1 tablet (10 mg total) by mouth every 6 (six) hours as needed for severe pain., Disp: 60 tablet, Rfl: 0   potassium chloride (KLOR-CON) 10 MEQ tablet, Take 10 mEq by mouth daily., Disp: , Rfl:    Vitamin D, Ergocalciferol, (DRISDOL) 1.25 MG (50000 UNIT) CAPS capsule, SMARTSIG:1 Pill By Mouth Once a Week, Disp: , Rfl:   Review of Symptoms: Complete 10-system review is negative except as above in  Interval History.  Physical Exam: BP 131/66 (BP Location: Right Arm, Patient Position: Sitting)   Pulse 72   Temp 98 F (36.7 C) (Oral)   Resp 20   Wt 231 lb 9.6 oz (105.1 kg)   SpO2 100%   BMI 41.91 kg/m  General: Alert, oriented, no acute distress. HEENT: Normocephalic, atraumatic. Neck symmetric without masses. Sclera anicteric.  Chest: Normal work of breathing. Clear to auscultation bilaterally.   Cardiovascular: Regular rate and rhythm, no murmurs. Abdomen: Soft, nontender.  Normoactive bowel sounds.  No masses appreciated.  Well-healed incisions. Extremities: Using walker.  Some limited mobility due to left hip pain.  Darkened and thickened/rough skin of bilateral lower extremities and edema equal bilaterally. Skin:Improvement in skin in bilateral groins and pannus Lymphatics: No cervical, supraclavicular, adenopathy.   GU: Normal appearing external genitalia without erythema, excoriation, or lesions. Additional assistance required to hold left leg due to limited mobility and pain of left hip.  Speculum exam reveals normal vaginal mucosa, no lesions.  Bimanual exam reveals smooth vaginal cuff. Exam limited by body habitus.   Exam chaperoned by Warner Mccreedy, NP    Laboratory & Radiologic Studies: CT ABDOMEN PELVIS W CONTRAST 10/25/2022  Narrative CLINICAL DATA:  High risk endometrial cancer; monitor. Evaluate for recurrence. * Tracking Code: BO *  EXAM: CT ABDOMEN AND PELVIS WITH CONTRAST  TECHNIQUE: Multidetector CT imaging of the abdomen and pelvis was performed using the standard protocol following bolus administration of intravenous contrast.  RADIATION DOSE REDUCTION: This exam was performed according to the departmental dose-optimization program which includes automated exposure control, adjustment of the mA and/or kV according to patient size and/or use of iterative reconstruction technique.  CONTRAST:  OMNIPAQUE IOHEXOL 300 MG/ML  SOLN  COMPARISON:  Abdominopelvic CT 05/10/2022 and 11/06/2021.  FINDINGS: Lower chest: Stable mild central airway thickening and lingular scarring. No confluent airspace opacity, suspicious pulmonary nodule or confluent airspace disease. Stable small hiatal hernia.  Hepatobiliary: No focal hepatic abnormalities are identified. No evidence of biliary dilatation status post cholecystectomy.  Pancreas: Unremarkable. No pancreatic ductal dilatation or surrounding inflammatory changes.  Spleen: Normal in size without focal abnormality.  Adrenals/Urinary Tract: Both adrenal glands appear normal. No evidence of urinary tract calculus, suspicious renal lesion or hydronephrosis. The bladder appears unremarkable for its degree of distention.  Stomach/Bowel: No enteric contrast administered. As above, stable small hiatal hernia. The stomach otherwise appears unremarkable for its degree of distention. No bowel distension, wall thickening or surrounding inflammation. The appendix appears normal. There is diffuse colonic diverticulosis without evidence of acute inflammation.  Vascular/Lymphatic: There are no enlarged abdominal or pelvic lymph nodes. No  acute vascular findings. Minimal aortoiliac atherosclerosis. Previous stenting of the left common and external iliac veins.  Reproductive: Status post hysterectomy. Low-density structure superior to the bladder on the right measuring 3.5 x 2.4 cm on image 57/3 is unchanged from the recent prior studies, although enlarged from older prior studies. No solid adnexal mass identified.  Other: No ascites or peritoneal nodularity. No pneumoperitoneum. The abdominal wall appears intact. Stable mild subcutaneous edema.  Musculoskeletal: No acute osseous findings or evidence of osseous metastatic disease. 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 Previous imaging has demonstrated a pathologic fracture in this area. There is stable chronic soft tissue thickening along the left pelvic sidewall. Multilevel spondylosis.  Unless specific follow-up recommendations are mentioned in the findings or impression sections, no imaging follow-up of any mentioned incidental findings is recommended.  IMPRESSION: 1.  No definite evidence of local recurrence or metastatic disease in the abdomen or pelvis. 2. Indeterminate low-density structure superior to the bladder on the right, stable from recent prior studies, possibly a lymphocele or other benign finding. Correlate with tumor markers. Recommend attention on follow-up. 3. Diffuse colonic diverticulosis without evidence of acute inflammation. 4. Stable sequela of previously demonstrated pathologic left hip fracture with severe left hip arthropathy. 5.  Aortic Atherosclerosis (ICD10-I70.0).   Electronically Signed By: Carey Bullocks M.D. On: 10/30/2022 15:57

## 2023-02-24 ENCOUNTER — Other Ambulatory Visit: Payer: Self-pay

## 2023-02-24 DIAGNOSIS — I82422 Acute embolism and thrombosis of left iliac vein: Secondary | ICD-10-CM

## 2023-02-25 ENCOUNTER — Inpatient Hospital Stay: Payer: 59 | Attending: Hematology and Oncology

## 2023-02-25 DIAGNOSIS — Z452 Encounter for adjustment and management of vascular access device: Secondary | ICD-10-CM | POA: Diagnosis not present

## 2023-02-25 DIAGNOSIS — Z8542 Personal history of malignant neoplasm of other parts of uterus: Secondary | ICD-10-CM | POA: Insufficient documentation

## 2023-02-25 DIAGNOSIS — C55 Malignant neoplasm of uterus, part unspecified: Secondary | ICD-10-CM

## 2023-02-25 MED ORDER — HEPARIN SOD (PORK) LOCK FLUSH 100 UNIT/ML IV SOLN
500.0000 [IU] | Freq: Once | INTRAVENOUS | Status: AC
  Administered 2023-02-25: 500 [IU]

## 2023-02-25 MED ORDER — SODIUM CHLORIDE 0.9% FLUSH
10.0000 mL | Freq: Once | INTRAVENOUS | Status: AC
  Administered 2023-02-25: 10 mL

## 2023-03-05 ENCOUNTER — Encounter: Payer: Self-pay | Admitting: Physician Assistant

## 2023-03-05 ENCOUNTER — Ambulatory Visit (INDEPENDENT_AMBULATORY_CARE_PROVIDER_SITE_OTHER): Payer: 59 | Admitting: Physician Assistant

## 2023-03-05 ENCOUNTER — Ambulatory Visit (HOSPITAL_COMMUNITY)
Admission: RE | Admit: 2023-03-05 | Discharge: 2023-03-05 | Disposition: A | Discharge status: Home / Self Care | Payer: 59 | Source: Ambulatory Visit | Attending: Vascular Surgery | Admitting: Vascular Surgery

## 2023-03-05 VITALS — BP 132/71 | HR 75 | Temp 97.8°F | Resp 20 | Ht 62.33 in | Wt 251.7 lb

## 2023-03-05 DIAGNOSIS — I82422 Acute embolism and thrombosis of left iliac vein: Secondary | ICD-10-CM | POA: Diagnosis present

## 2023-03-05 DIAGNOSIS — I872 Venous insufficiency (chronic) (peripheral): Secondary | ICD-10-CM | POA: Diagnosis not present

## 2023-03-05 DIAGNOSIS — I83009 Varicose veins of unspecified lower extremity with ulcer of unspecified site: Secondary | ICD-10-CM | POA: Diagnosis not present

## 2023-03-05 DIAGNOSIS — L97909 Non-pressure chronic ulcer of unspecified part of unspecified lower leg with unspecified severity: Secondary | ICD-10-CM | POA: Diagnosis not present

## 2023-03-05 NOTE — Progress Notes (Signed)
 HISTORY AND PHYSICAL     CC:  follow up. Requesting Provider:  Lorella Roles, MD  HPI: This is a 78 y.o. female who is here today for follow up for hx of DVT with severe post thrombotic syndrome.  Pt has hx of stent of the left CIV and EIV on 02/06/2018 by Dr. Vikki Graves.  In July 2020, she had occluded stent and then underwent mechanical thrombectomy of left CIV and EIV, left CFV and FV and stent of left CIV and EIV on 09/08/2018 by Dr. Vikki Graves.    She has hx of Endometrial cancer and continues to be closely followed by oncology.   Pt was last seen 02/13/2022 and at that time, she was living independently and ambulating with a rolling walker.  She was having discomfort in her left hip and worried about DVT.  Her study was negative for DVT.  She was scheduled for one year follow up, encouraged to continue wearing her compression socks and elevating and exercising.    The pt returns today for follow up.  She states that she continues to have swelling in her legs.  She states that she has a wound on the back of her left leg.   She has plans of calling the wound care center.  She states she has been compliant with her eliquis .  She does have skin color changes and swelling in both legs.  She states that bc of her left hip pain, she has trouble bending over to put her compression on.  She states she tries to elevate her legs with pillows.  She cannot lay flat as it affects her breathing.    She states that she has lost her oldest sister who lived in [place] to cancer.    The pt is not on a statin for cholesterol management.    The pt is not on an aspirin .    Other AC:  Eliquis  The pt is not on medication for hypertension.  The pt is not on medication for diabetes. 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   Primary localized osteoarthritis of right knee    Uterine cancer (HCC) 06/23/2018    Past Surgical History:  Procedure Laterality Date   ABDOMINAL HYSTERECTOMY  2012   CHOLECYSTECTOMY N/A 03/09/2013   Procedure: LAPAROSCOPIC CHOLECYSTECTOMY;  Surgeon: Fran Imus, MD;  Location: [facility] OR;  Service: General;  Laterality: N/A;   COLONOSCOPY W/ BIOPSIES     IR IMAGING GUIDED PORT INSERTION  06/29/2018   LARYNGOSCOPY Left 03/14/2017   Procedure: LARYNGOSCOPY;  Surgeon: Eldon Greenland, MD;  Location: [facility] OR;  Service: ENT;  Laterality: Left;   LOWER EXTREMITY VENOGRAPHY Left 01/19/2018   Procedure: LOWER EXTREMITY VENOGRAPHY;  Surgeon: Adine Hoof, MD;  Location: [facility] INVASIVE CV LAB;  Service: Cardiovascular;  Laterality: Left;   LOWER EXTREMITY VENOGRAPHY N/A  09/08/2018   Procedure: LOWER EXTREMITY VENOGRAPHY;  Surgeon: Adine Hoof, MD;  Location: [facility] INVASIVE CV LAB;  Service: Cardiovascular;  Laterality: N/A;   LYMPH NODE BIOPSY Left 06/18/2018   Procedure: EXCISIONAL BIOPSY LEFT INGUINAL LYMPH NODE;  Surgeon: Oza Blumenthal, MD;  Location: [facility] OR;  Service: General;  Laterality: Left;   PERIPHERAL VASCULAR INTERVENTION Left 01/19/2018   Procedure: PERIPHERAL VASCULAR INTERVENTION;  Surgeon: Adine Hoof, MD;  Location: [facility] INVASIVE CV LAB;  Service: Cardiovascular;  Laterality: Left;  LEFT ILIAC VENOUS   PERIPHERAL VASCULAR INTERVENTION Left 09/08/2018   Procedure: PERIPHERAL VASCULAR INTERVENTION;  Surgeon: Adine Hoof, MD;  Location: [facility] INVASIVE CV LAB;  Service: Cardiovascular;  Laterality: Left;  lower extremity   TOTAL KNEE  ARTHROPLASTY  04/29/2011   Procedure: TOTAL KNEE ARTHROPLASTY;  Surgeon: Genevie Kerns, MD;  Location: [facility] OR;  Service: Orthopedics;  Laterality: Left;  DR Alvia Awkward 90 MINUTES FOR THIS CASE   TOTAL KNEE ARTHROPLASTY Right 07/04/2014   Procedure: TOTAL KNEE ARTHROPLASTY;  Surgeon: Elly Habermann, MD;  Location: [facility] OR;  Service: Orthopedics;  Laterality: Right;    No Known Allergies  Current Outpatient Medications  Medication Sig Dispense Refill   acetaminophen  (TYLENOL ) 325 MG tablet Take by mouth.     apixaban  (ELIQUIS ) 2.5 MG TABS tablet Take by mouth 2 (two) times daily.     diclofenac  sodium (VOLTAREN ) 1 % GEL APPLY 4GRAMS 4 TIMES A DAY AS NEEDED FOR PAINS     furosemide  (LASIX ) 40 MG tablet Take 40 mg by mouth daily as needed.     gabapentin  (NEURONTIN ) 300 MG capsule Take 300 mg by mouth 3 (three) times daily.     lidocaine -prilocaine  (EMLA ) cream Apply 1 application topically daily as needed. 30 g 3   methadone  (DOLOPHINE ) 10 MG tablet Take 1 tablet (10 mg total) by mouth every 12 (twelve) hours. 60 tablet 0   Olopatadine  HCl 0.2 % SOLN Place 1 drop into both eyes daily.     oxyCODONE  10 MG TABS Take 1 tablet (10 mg total) by mouth every 6 (six) hours as needed for severe pain. 60 tablet 0   potassium chloride  (KLOR-CON ) 10 MEQ tablet Take 10 mEq by mouth daily.     Vitamin D, Ergocalciferol, (DRISDOL) 1.25 MG (50000 UNIT) CAPS capsule SMARTSIG:1 Pill By Mouth Once a Week     No current facility-administered medications for this visit.    Family History  Problem Relation Age of Onset   Arthritis Mother    Hypertension Mother    Alzheimer's disease Father    Diabetes Sister    Hypertension Sister    Hypertension Sister    Hypertension Sister    Hypertension Sister    Hypertension Brother    Stroke Brother    Hypertension Brother    Breast cancer Niece 50       sister's daughter   Breast cancer Other        Niece   Anesthesia problems Neg Hx    Hypotension Neg Hx     Malignant hyperthermia Neg Hx    Pseudochol deficiency Neg Hx    Colon cancer Neg Hx    Ovarian cancer Neg Hx    Endometrial cancer Neg Hx    Pancreatic cancer Neg Hx    Prostate cancer Neg Hx     Social History   Socioeconomic History   Marital status: Divorced    Spouse name: Not on file   Number of children:  1   Years of education: Not on file   Highest education level: Not on file  Occupational History   Occupation: Retired   Tobacco Use   Smoking status: Former    Current packs/day: 0.00    Types: Cigarettes    Start date: 04/23/1987    Quit date: 04/22/1988    Years since quitting: 34.8   Smokeless tobacco: Never  Vaping Use   Vaping status: Never Used  Substance and Sexual Activity   Alcohol use: No   Drug use: No   Sexual activity: Not Currently    Birth control/protection: Surgical  Other Topics Concern   Not on file  Social History Narrative   Daily caffeine    Social Drivers of Corporate investment banker Strain: Not on file  Food Insecurity: Not on file  Transportation Needs: Not on file  Physical Activity: Not on file  Stress: Not on file  Social Connections: Not on file  Intimate Partner Violence: Not on file     REVIEW OF SYSTEMS:   [X]  denotes positive finding, [ ]  denotes negative finding Cardiac  Comments:  Chest pain or chest pressure:    Shortness of breath upon exertion:    Short of breath when lying flat:    Irregular heart rhythm:        Vascular    Pain in calf, thigh, or hip brought on by ambulation:    Pain in feet at night that wakes you up from your sleep:     Blood clot in your veins:    Leg swelling:  x       Pulmonary    Oxygen at home:    Productive cough:     Wheezing:         Neurologic    Sudden weakness in arms or legs:     Sudden numbness in arms or legs:     Sudden onset of difficulty speaking or slurred speech:    Temporary loss of vision in one eye:     Problems with dizziness:         Gastrointestinal     Blood in stool:     Vomited blood:         Genitourinary    Burning when urinating:     Blood in urine:        Psychiatric    Major depression:         Hematologic    Bleeding problems:    Problems with blood clotting too easily:        Skin    Rashes or ulcers: x       Constitutional    Fever or chills:      PHYSICAL EXAMINATION:  Today's Vitals   03/05/23 1119  BP: 132/71  Pulse: 75  Resp: 20  Temp: 97.8 F (36.6 C)  TempSrc: Temporal  SpO2: 100%  Weight: 251 lb 11.2 oz (114.2 kg)  Height: 5' 2.33" (1.583 m)  PainSc: 3    Body mass index is 45.55 kg/m.   General:  WDWN in NAD; vital signs documented above Gait: Not observed HENT: WNL, normocephalic Pulmonary: normal non-labored breathing , without wheezing Cardiac: regular HR, without carotid bruits Abdomen: obese Skin: without rashes Vascular Exam/Pulses:  Right Left  Radial 2+ (normal) 2+ (normal)  DP 2+ (normal) 2+ (normal)   Extremities:   Lipodermatosclerosis  + stemmer sign right foot    Left posterior lower leg   Musculoskeletal: no muscle wasting  or atrophy  Neurologic: A&O X 3 Psychiatric:  The pt has Normal affect.   Non-Invasive Vascular Imaging:   Venous duplex on 03/05/2023: IVC/Iliac: IVC, left CIV and EIV patent where visualized    Previous Venous duplex on 02/13/2022: IVC/Iliac: There is no evidence of thrombus involving the left common  iliac vein.   There is no evidence of thrombus involving the left external iliac vein.  This is based on limited visualization due to limitations as stated above.     ASSESSMENT/PLAN:: 78 y.o. female with hx of endometrial cancer and severe thrombotic syndrome here for follow up with hx of stent of the left CIV and EIV on 02/06/2018 by Dr. Vikki Graves.  In July 2020, she had occluded stent and then underwent mechanical thrombectomy of left CIV and EIV, left CFV and FV and stent of left CIV and EIV on 09/08/2018 by Dr. Vikki Graves.   -IVC and  iliac veins patent where visualized.   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  Will put unna boot on today and she will f/u with wound care center as they have placed her in unna boots in the past.  If she cannot get in with them in a week, she will return for unna boot until she can get appt.   -pt will f/u in one year with IVC and left iliac venous duplex. -she will call sooner if any issues before then   [name], [facility] [phone]  Clinic MD:   Vikki Graves

## 2023-03-19 ENCOUNTER — Encounter: Payer: Self-pay | Admitting: *Deleted

## 2023-03-19 ENCOUNTER — Telehealth: Payer: Self-pay | Admitting: *Deleted

## 2023-03-19 NOTE — Telephone Encounter (Signed)
Called pt to make aware of scheduled CT scan on 3/11 starting at 10 am for port/flush labs and 12 pm CT scan. Pt verbalized understanding

## 2023-04-01 ENCOUNTER — Other Ambulatory Visit: Payer: Self-pay

## 2023-04-01 DIAGNOSIS — I872 Venous insufficiency (chronic) (peripheral): Secondary | ICD-10-CM

## 2023-04-24 ENCOUNTER — Encounter (HOSPITAL_BASED_OUTPATIENT_CLINIC_OR_DEPARTMENT_OTHER): Payer: 59 | Attending: Internal Medicine | Admitting: Internal Medicine

## 2023-04-24 DIAGNOSIS — L97822 Non-pressure chronic ulcer of other part of left lower leg with fat layer exposed: Secondary | ICD-10-CM | POA: Insufficient documentation

## 2023-04-24 DIAGNOSIS — I87312 Chronic venous hypertension (idiopathic) with ulcer of left lower extremity: Secondary | ICD-10-CM | POA: Insufficient documentation

## 2023-04-24 DIAGNOSIS — I890 Lymphedema, not elsewhere classified: Secondary | ICD-10-CM | POA: Insufficient documentation

## 2023-04-29 ENCOUNTER — Inpatient Hospital Stay: Payer: 59 | Attending: Hematology and Oncology

## 2023-04-29 ENCOUNTER — Ambulatory Visit (HOSPITAL_COMMUNITY)
Admission: RE | Admit: 2023-04-29 | Discharge: 2023-04-29 | Disposition: A | Discharge status: Home / Self Care | Payer: 59 | Source: Ambulatory Visit | Attending: Hematology and Oncology | Admitting: Hematology and Oncology

## 2023-04-29 ENCOUNTER — Encounter (HOSPITAL_COMMUNITY): Payer: Self-pay

## 2023-04-29 DIAGNOSIS — Z8542 Personal history of malignant neoplasm of other parts of uterus: Secondary | ICD-10-CM | POA: Insufficient documentation

## 2023-04-29 DIAGNOSIS — E039 Hypothyroidism, unspecified: Secondary | ICD-10-CM

## 2023-04-29 DIAGNOSIS — C55 Malignant neoplasm of uterus, part unspecified: Secondary | ICD-10-CM | POA: Insufficient documentation

## 2023-04-29 DIAGNOSIS — C7951 Secondary malignant neoplasm of bone: Secondary | ICD-10-CM | POA: Diagnosis not present

## 2023-04-29 DIAGNOSIS — Z86718 Personal history of other venous thrombosis and embolism: Secondary | ICD-10-CM | POA: Insufficient documentation

## 2023-04-29 DIAGNOSIS — Z7901 Long term (current) use of anticoagulants: Secondary | ICD-10-CM | POA: Diagnosis not present

## 2023-04-29 DIAGNOSIS — D61818 Other pancytopenia: Secondary | ICD-10-CM | POA: Diagnosis not present

## 2023-04-29 LAB — COMPREHENSIVE METABOLIC PANEL
ALT: 7 U/L (ref 0–44)
AST: 11 U/L — ABNORMAL LOW (ref 15–41)
Albumin: 4.1 g/dL (ref 3.5–5.0)
Alkaline Phosphatase: 77 U/L (ref 38–126)
Anion gap: 6 (ref 5–15)
BUN: 17 mg/dL (ref 8–23)
CO2: 26 mmol/L (ref 22–32)
Calcium: 8.7 mg/dL — ABNORMAL LOW (ref 8.9–10.3)
Chloride: 107 mmol/L (ref 98–111)
Creatinine, Ser: 0.7 mg/dL (ref 0.44–1.00)
GFR, Estimated: >60 mL/min (ref >60)
Glucose, Bld: 96 mg/dL (ref 70–99)
Potassium: 3.8 mmol/L (ref 3.5–5.1)
Sodium: 139 mmol/L (ref 135–145)
Total Bilirubin: 0.5 mg/dL (ref 0.0–1.2)
Total Protein: 7.5 g/dL (ref 6.5–8.1)

## 2023-04-29 LAB — CBC WITH DIFFERENTIAL/PLATELET
Abs Immature Granulocytes: 0.01 10*3/uL (ref 0.00–0.07)
Basophils Absolute: 0 10*3/uL (ref 0.0–0.1)
Basophils Relative: 1 %
Eosinophils Absolute: 0.1 10*3/uL (ref 0.0–0.5)
Eosinophils Relative: 4 %
HCT: 34.7 % — ABNORMAL LOW (ref 36.0–46.0)
Hemoglobin: 11.2 g/dL — ABNORMAL LOW (ref 12.0–15.0)
Immature Granulocytes: 0 %
Lymphocytes Relative: 29 %
Lymphs Abs: 1 10*3/uL (ref 0.7–4.0)
MCH: 28 pg (ref 26.0–34.0)
MCHC: 32.3 g/dL (ref 30.0–36.0)
MCV: 86.8 fL (ref 80.0–100.0)
Monocytes Absolute: 0.4 10*3/uL (ref 0.1–1.0)
Monocytes Relative: 10 %
Neutro Abs: 2 10*3/uL (ref 1.7–7.7)
Neutrophils Relative %: 56 %
Platelets: 215 10*3/uL (ref 150–400)
RBC: 4 MIL/uL (ref 3.87–5.11)
RDW: 15 % (ref 11.5–15.5)
WBC: 3.6 10*3/uL — ABNORMAL LOW (ref 4.0–10.5)
nRBC: 0 % (ref 0.0–0.2)

## 2023-04-29 MED ORDER — SODIUM CHLORIDE 0.9% FLUSH
10.0000 mL | Freq: Once | INTRAVENOUS | Status: AC
  Administered 2023-04-29: 10 mL

## 2023-04-29 MED ORDER — HEPARIN SOD (PORK) LOCK FLUSH 100 UNIT/ML IV SOLN
500.0000 [IU] | Freq: Once | INTRAVENOUS | Status: AC
  Administered 2023-04-29: 500 [IU] via INTRAVENOUS

## 2023-04-29 MED ORDER — IOHEXOL 300 MG/ML  SOLN
100.0000 mL | Freq: Once | INTRAMUSCULAR | Status: AC | PRN
  Administered 2023-04-29: 100 mL via INTRAVENOUS

## 2023-04-29 MED ORDER — HEPARIN SOD (PORK) LOCK FLUSH 100 UNIT/ML IV SOLN
INTRAVENOUS | Status: AC
  Filled 2023-04-29: qty 5

## 2023-05-02 ENCOUNTER — Ambulatory Visit (HOSPITAL_BASED_OUTPATIENT_CLINIC_OR_DEPARTMENT_OTHER): Admitting: Internal Medicine

## 2023-05-05 ENCOUNTER — Telehealth: Payer: Self-pay

## 2023-05-05 NOTE — Telephone Encounter (Signed)
 Returned her call. She is needing to reschedule 3/18 appt due to not feeling well. Rescheduled appt per her request to 3/25, she is aware of appt date/time.  FYI

## 2023-05-06 ENCOUNTER — Inpatient Hospital Stay: Payer: 59 | Admitting: Hematology and Oncology

## 2023-05-09 ENCOUNTER — Encounter (HOSPITAL_BASED_OUTPATIENT_CLINIC_OR_DEPARTMENT_OTHER): Admitting: Internal Medicine

## 2023-05-09 DIAGNOSIS — I890 Lymphedema, not elsewhere classified: Secondary | ICD-10-CM

## 2023-05-09 DIAGNOSIS — I87312 Chronic venous hypertension (idiopathic) with ulcer of left lower extremity: Secondary | ICD-10-CM

## 2023-05-09 DIAGNOSIS — L97822 Non-pressure chronic ulcer of other part of left lower leg with fat layer exposed: Secondary | ICD-10-CM | POA: Diagnosis not present

## 2023-05-13 ENCOUNTER — Inpatient Hospital Stay (HOSPITAL_BASED_OUTPATIENT_CLINIC_OR_DEPARTMENT_OTHER): Admitting: Hematology and Oncology

## 2023-05-13 VITALS — BP 130/62 | HR 87 | Temp 98.0°F | Resp 18 | Ht 62.33 in | Wt 263.4 lb

## 2023-05-13 DIAGNOSIS — D61818 Other pancytopenia: Secondary | ICD-10-CM | POA: Diagnosis not present

## 2023-05-13 DIAGNOSIS — C55 Malignant neoplasm of uterus, part unspecified: Secondary | ICD-10-CM

## 2023-05-13 DIAGNOSIS — I825Z2 Chronic embolism and thrombosis of unspecified deep veins of left distal lower extremity: Secondary | ICD-10-CM | POA: Diagnosis not present

## 2023-05-13 NOTE — Assessment & Plan Note (Addendum)
 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

## 2023-05-13 NOTE — Progress Notes (Signed)
 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 MMR: abnormal, MSI: High, Genetics are negative She received 3 doses of carboplatin and paclitaxel completed in 2020 along with radiation therapy followed by maintenance pembrolizumab until 2023  She is doing well off maintenance treatment I reviewed CT imaging from March 2025 which showed no evidence of recurrent disease I recommend repeat imaging study again in September 2025 and if she has no recurrent disease, we will get her port removed Pancytopenia, acquired (HCC) She has chronic intermittent leukopenia but not symptomatic The cause of anemia is likely due to anemia chronic illness She is not symptomatic Observe Lower leg DVT (deep venous thromboembolism), chronic, left (HCC) She is taking both anticoagulation therapy and antiplatelet agent She has no recent bleeding She will continue her medications as directed   No orders of the defined types were placed in this encounter.    Artis Delay, MD  INTERVAL HISTORY: she returns for surveillance followup She is doing well No bleeding complications from anticoagulation therapy She denies pain from her left hip We reviewed test results and discussed future follow-up  PHYSICAL EXAMINATION: ECOG PERFORMANCE STATUS: 1 - Symptomatic but completely ambulatory  Vitals:   05/13/23 1045  BP: 130/62  Pulse: 87  Resp: 18  Temp: 98 F (36.7 C)  SpO2: 100%   Filed Weights   05/13/23 1045  Weight: 263 lb  6.4 oz (119.5 kg)    Relevant data reviewed during this visit included CBC, CMP and CT imaging from March 2025

## 2023-05-13 NOTE — Assessment & Plan Note (Addendum)
She is taking both anticoagulation therapy and antiplatelet agent She has no recent bleeding She will continue her medications as directed  

## 2023-05-13 NOTE — Assessment & Plan Note (Addendum)
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

## 2023-05-16 ENCOUNTER — Encounter (HOSPITAL_BASED_OUTPATIENT_CLINIC_OR_DEPARTMENT_OTHER): Admitting: Internal Medicine

## 2023-05-16 DIAGNOSIS — I890 Lymphedema, not elsewhere classified: Secondary | ICD-10-CM

## 2023-05-16 DIAGNOSIS — L97822 Non-pressure chronic ulcer of other part of left lower leg with fat layer exposed: Secondary | ICD-10-CM

## 2023-05-16 DIAGNOSIS — I87312 Chronic venous hypertension (idiopathic) with ulcer of left lower extremity: Secondary | ICD-10-CM

## 2023-05-22 ENCOUNTER — Encounter (HOSPITAL_BASED_OUTPATIENT_CLINIC_OR_DEPARTMENT_OTHER): Attending: Internal Medicine | Admitting: Internal Medicine

## 2023-05-30 ENCOUNTER — Ambulatory Visit (HOSPITAL_BASED_OUTPATIENT_CLINIC_OR_DEPARTMENT_OTHER): Admitting: Internal Medicine

## 2023-07-08 ENCOUNTER — Inpatient Hospital Stay: Attending: Hematology and Oncology

## 2023-07-08 DIAGNOSIS — Z8542 Personal history of malignant neoplasm of other parts of uterus: Secondary | ICD-10-CM | POA: Insufficient documentation

## 2023-07-08 DIAGNOSIS — C55 Malignant neoplasm of uterus, part unspecified: Secondary | ICD-10-CM

## 2023-07-08 DIAGNOSIS — Z452 Encounter for adjustment and management of vascular access device: Secondary | ICD-10-CM | POA: Diagnosis not present

## 2023-07-08 MED ORDER — SODIUM CHLORIDE 0.9% FLUSH
10.0000 mL | Freq: Once | INTRAVENOUS | Status: AC
Start: 2023-07-08 — End: 2023-07-08
  Administered 2023-07-08: 10 mL

## 2023-07-08 MED ORDER — HEPARIN SOD (PORK) LOCK FLUSH 100 UNIT/ML IV SOLN
250.0000 [IU] | Freq: Once | INTRAVENOUS | Status: AC
  Administered 2023-07-08: 250 [IU]

## 2023-08-04 ENCOUNTER — Encounter: Payer: Self-pay | Admitting: Psychiatry

## 2023-08-04 ENCOUNTER — Inpatient Hospital Stay: Payer: 59 | Attending: Hematology and Oncology | Admitting: Psychiatry

## 2023-08-04 VITALS — BP 122/65 | HR 75 | Temp 97.9°F | Resp 18 | Ht 62.33 in | Wt 253.4 lb

## 2023-08-04 DIAGNOSIS — Z8542 Personal history of malignant neoplasm of other parts of uterus: Secondary | ICD-10-CM | POA: Insufficient documentation

## 2023-08-04 DIAGNOSIS — Z923 Personal history of irradiation: Secondary | ICD-10-CM | POA: Insufficient documentation

## 2023-08-04 DIAGNOSIS — Z90722 Acquired absence of ovaries, bilateral: Secondary | ICD-10-CM | POA: Insufficient documentation

## 2023-08-04 DIAGNOSIS — Z90710 Acquired absence of both cervix and uterus: Secondary | ICD-10-CM | POA: Insufficient documentation

## 2023-08-04 DIAGNOSIS — Z9221 Personal history of antineoplastic chemotherapy: Secondary | ICD-10-CM | POA: Diagnosis not present

## 2023-08-04 DIAGNOSIS — Z9079 Acquired absence of other genital organ(s): Secondary | ICD-10-CM | POA: Insufficient documentation

## 2023-08-04 DIAGNOSIS — C541 Malignant neoplasm of endometrium: Secondary | ICD-10-CM

## 2023-08-04 NOTE — Patient Instructions (Signed)
It was a pleasure to see you in clinic today. - Normal exam today. - Return visit planned for 6 months.  Thank you very much for allowing me to provide care for you today.  I appreciate your confidence in choosing our Gynecologic Oncology team at [facility].  If you have any questions about your visit today please call our office or send [place] a MyChart message and we will get back to you as soon as possible.

## 2023-08-04 NOTE — Progress Notes (Signed)
 Gynecologic Oncology Return Clinic Visit  Date of Service: 08/04/2023 Referring Provider: Almeda Jacobs, MD  Assessment & Plan: [name] is a 78 y.o. 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  Now currently off of treatment.  She presents for endometrial cancer surveillance.  Recurrent endometrial cancer: - Clinically NED on exam today. - Last CT on 04/29/23 NED - Signs and symptoms of recurrence reviewed with patient. - Discussed surveillance every 3 months initially. - Dr. Marton Sleeper planning follow-up and repeat scan 63months from prior (~Sept)   RTC 63mo (alt q63mo with Dr. Marton Sleeper).  Derrel Flies, MD Gynecologic Oncology   Medical Decision Making I personally spent  TOTAL 20 minutes face-to-face and non-face-to-face in the care of this patient, which includes all pre, intra, and post visit time on the date of service.   ----------------------- Reason for Visit: Surveillance  Treatment History: Oncology History Overview Note  Hx of endometrioid cancer in 2012 (FIGO grade II, [id number]), recurrent disease in 2020 MMR: abnormal MSI: High Genetics are negative   Uterine cancer (HCC)  07/03/2010 Pathology Results   1. Uterus +/- tubes/ovaries, neoplastic, with left fallopian tube and ovary - INVASIVE ENDOMETRIOID CARCINOMA (1.5 CM), FIGO GRADE II, ARISING IN A BACKGROUND OF ATYPICAL COMPLEX HYPERPLASIA, CONFINED WITHIN INNER HALF OF THE MYOMETRIUM. - ENDOMETRIAL POLYP WITH ASSOCIATED ATYPICAL COMPLEX HYPERPLASIA. - MYOMETRIUM: LEIOMYOMATA. - CERVIX: BENIGN SQUAMOUS MUCOSA AND ENDOCERVICAL MUCOSA, NO DYSPLASIA OR MALIGNANCY. - LEFT OVARY: BENIGN OVARIAN TISSUE WITH ENDOSALPINGOSIS, NO EVIDENCE OF ATYPIA OR MALIGNANCY. - LEFT FALLOPIAN TUBE: NO HISTOLOGIC ABNORMALITIES. - PLEASE SEE ONCOLOGY TEMPLATE FOR DETAIL. 2. Ovary and  fallopian tube, right - BENIGN OVARIAN TISSUE WITH ENDOSALPINGOSIS, NO ATYPIA OR MALIGNANCY. - BENIGN FALLOPIAN TUBAL TISSUE, NO PATHOLOGIC ABNORMALITIES. Microscopic Comment 1. UTERUS Specimen: Uterus, cervix, bilateral ovaries and fallopian tubes Procedure: Total hysterectomy and bilateral salpingo-oophorectomy Lymph node sampling performed: No Specimen integrity: Intact Maximum tumor size (cm): 1.5 cm, glass slide measurement Histologic type: Invasive endometrioid carcinoma Grade: FIGO grade II Myometrial invasion: 1 cm where myometrium is 2.3 cm in thickness Cervical stromal involvement: No Extent of involvement of other organs: No Lymph vascular invasion: Not identified Peritoneal washings: Negative ([id number]) Lymph nodes: number examined N/A; number positive N/A TNM code: pT1a, pNX 1 oFf 3IGO Stage (based on pathologic findings, needs clinical correlation): IA  Comments: Sections the endomyometrium away from the grossly identified endometrial polyp show an invasive FIGO grade II endometrioid carcinoma. The tumor is confined within inner half of the myometrium. No angiolymphatic invasion is identified. No cervical stromal involvement is identified. Sections of the grossly identified endometrial polyp show an endometrial polyp with associated atypical compacted hyperplasia with no definitive evidence of carcinoma.   12/07/2017 Imaging   US  venous Doppler Right: No evidence of common femoral vein obstruction. Left: Findings consistent with acute deep vein thrombosis involving the left femoral vein, left proximal profunda vein, and left popliteal vein. Unable to adequately interrogate the common femoral and higher, or the calf secondary to significant edema and body habitus   12/07/2017 [facility] Admission   She presented to the ER and was diagnosed with acute DVT   01/18/2018 - 01/21/2018 Hospital Admission   She was admitted to the hospital for management of severe persistent  DVT   01/18/2018 Imaging   US  venous Doppler Right: No evidence of common femoral vein obstruction. Left: Findings  consistent with acute deep vein thrombosis involving the left common femoral vein, and left popliteal vein.   01/19/2018 Surgery   Pre-operative Diagnosis: Subacute DVT with severe post thrombotic syndrome Post-operative diagnosis:  Same Surgeon:  Ace Holder C. Vikki Graves, MD Procedure Performed: 1.  Ultrasound-guided cannulation left small saphenous vein 2.  Left lower extremity and central venography 3.  Intravascular ultrasound of left popliteal, femoral, common femoral, external and common iliac veins and IVC 4.  Stent of left common and external iliac veins with 14 x 60 mm Vici 5.  Moderate sedation with fentanyl  and Versed  for 50 minutes   Indications: 78 year old female with a history of DVT in October now presents with persistent left lower extremity swelling and ultrasound demonstrating likely persistent DVT.  She has been on Xarelto  at this time.  She is now indicated for venogram possible intervention.   Findings: Flow in the left lower extremity was stagnant throughout but by venogram all veins were patent.  There was a focal occlusive area approximately 2 cm in length at the common and external iliac vein junction at the hypogastric on the left.  After stenting and ballooning we had a diameter of 12 millimeters in the stent and venogram demonstrated flow in the lower extremity veins were previously was stagnant and no further residual stenosis in the left common and external iliac vein junction.   04/12/2018 Imaging   US  Venous Doppler Right: No evidence of common femoral vein obstruction. Left: There is no evidence of deep vein thrombosis in the lower extremity. However, portions of this examination were limited- see technologist comments above. Left groin: Large hypoechoic area with mixed echoes noted measuring nearly 10 cm. Possible  hematoma versus unknown etiology.  Ultrasound characteristics of enlarged lymph nodes noted in the groin.      05/15/2018 Imaging   US  Venous Doppler Right: No evidence of deep vein thrombosis in the lower extremity. No indirect evidence of obstruction proximal to the inguinal ligament. Left: No reflux was noted in the common femoral vein , femoral vein in the thigh, popliteal vein, great saphenous vein at the saphenofemoral junction, great saphenous vein at the proximal thigh, great saphenous vein at the mid thigh, great saphenous vein  at the distal thigh, great saphenous vein at the knee, origin of the small saphenous vein, proximal small saphenous vein, and mid small saphenous vein. There is no evidence of deep vein thrombosis in the lower extremity. There is no evidence of superficial venous thrombosis. No cystic structure found in the popliteal fossa. Unable to evaluate extension of common femoral vein obstruction proximal to the inguinal ligament.   06/01/2018 Imaging   1. 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 Additionally, there is lucency involving the medial sidewall of the left acetabulum with potential nondisplaced pathologic fracture. Further evaluation with contrast-enhanced pelvic MRI could be performed as clinically indicated. 2. The left pelvic arterial and venous system is encased by this infiltrative left pelvic sidewall mass however while difficult to ascertain, the left external iliac venous stent appears patent.   06/18/2018 Pathology Results   Lymph node for lymphoma, Left Inguinal - METASTATIC ADENOCARCINOMA, SEE COMMENT. Microscopic Comment Immunohistochemistry is positive for cytokeratin 7, PAX8, ER, and PR. Cytokeratin 5/6,and p63 are negative. The immunoprofile along with the patient's history are consistent with a gynecologic primary.   06/18/2018 Surgery   Pre-op Diagnosis: INGUINAL  LYMPHADENOPATHY, PELVIC MASS      Procedure(s):  EXCISIONAL BIOPSY DEEP LEFT INGUINAL LYMPH NODE   Surgeon(s): Oza Blumenthal, MD      06/24/2018 Cancer Staging   Staging form: Corpus Uteri - Carcinoma and Carcinosarcoma, AJCC 8th Edition - Clinical: Stage IVB (cT1a, cN2, pM1) - Signed by Almeda Jacobs, MD on 06/24/2018    Genetic Testing   Patient has genetic testing done for MMR on pathology from 06/18/2018. Results revealed patient has the following mutation(s): MMR: abnormal   06/29/2018 Procedure   Placement of a subcutaneous port device. Catheter tip at the SVC and right atrium junction.    Genetic Testing   Patient has genetic testing done for MSI on pathology from 06/18/2018. Results revealed patient has the following mutation(s): MSI: High   07/02/2018 PET scan   Previous hysterectomy, with asymmetric focus of hypermetabolic activity in the left vaginal cuff, suspicious for residual or recurrent carcinoma.   Large hypermetabolic soft tissue mass involving the left pelvic sidewall and acetabulum, consistent with metastatic disease.   No evidence metastatic disease within the abdomen, chest, or neck.   07/09/2018 Tumor Marker   Patient's tumor was tested for the following markers: CA-125 Results of the tumor marker test revealed 9   07/10/2018 - 08/24/2018 Chemotherapy   The patient had carboplatin  and taxol  x 3 cycles   07/17/2018 Genetic Testing   Negative genetic testing on the common hereditary cancer panel.  The Common Hereditary Gene Panel offered by [facility] includes sequencing and/or deletion duplication testing of the following 48 genes: APC, ATM, AXIN2, BARD1, BMPR1A, BRCA1, BRCA2, BRIP1, CDH1, CDK4, CDKN2A (p14ARF), CDKN2A (p16INK4a), CHEK2, CTNNA1, DICER1, EPCAM (Deletion/duplication testing only), GREM1 (promoter region deletion/duplication testing only), KIT, MEN1, MLH1, MSH2, MSH3, MSH6, MUTYH, NBN, NF1, NHTL1, PALB2, PDGFRA, PMS2, POLD1, POLE, PTEN, RAD50, RAD51C,  RAD51D, RNF43, SDHB, SDHC, SDHD, SMAD4, SMARCA4. STK11, TP53, TSC1, TSC2, and VHL.  The following genes were evaluated for sequence changes only: SDHA and HOXB13 c.251G>A variant only. The report date is Jul 17, 2018.    10/03/2018 Imaging   CT abdomen and pelvis 1.  No acute intra-abdominal process. 2. Grossly unchanged left pelvic sidewall mass with osseous involvement of the medial acetabulum. Progressive mild displacement of the associated comminuted pathologic fracture involving the right acetabulum and puboacetabular junction.  3. New venous stents extending from the left common iliac vein origin to the proximal left common femoral vein. The stents are patent.   11/06/2018 -  Chemotherapy   The patient had pembrolizumab  for chemotherapy treatment.     01/28/2019 Imaging   1. No substantial interval change in exam. 2. Interval development of mild fullness in the left intrarenal collecting system and ureter without overt hydronephrosis at this time. 3. Abnormal soft tissue along the left pelvic sidewall has decreased slightly in the interval. 4. Similar appearance of ill-defined fascial planes in the pelvis with some peritoneal thickening along the right pelvic sidewall and potentially involving the sigmoid mesocolon. 5. No substantial ascites.   05/03/2019 Imaging   1. Stable mild left pelvic sidewall soft tissue density. No new or progressive disease identified within the abdomen or pelvis.  2. Colonic diverticulosis. No radiographic evidence of diverticulitis.   Aortic Atherosclerosis (ICD10-I70.0).   09/09/2019 Imaging   1. No change in appearance of soft tissue thickening along the LEFT pelvic sidewall adjacent to chronic LEFT acetabular fracture. 2. Mild asymmetry of the bladder wall favoring the LEFT bladder wall, not well assessed. Similar accounting for variable degrees of distension on prior studies potentially related to prior radiation,  attention on follow-up. 3. Signs of  venous stenting in the LEFT hemipelvis with LEFT lower extremity muscular atrophy and mild stranding with similar appearance. Signs of colonic diverticulosis and diverticular disease without change.   02/24/2020 Imaging   1. Unchanged appearance of the pelvis as detailed below. 2. Unchanged soft tissue thickening of the left pelvic sidewall. 3. 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 4. No evidence discrete mass or lymphadenopathy nor metastatic disease in the abdomen or pelvis. 5. Status post hysterectomy and cholecystectomy. 6. Left common iliac vein stent. 7. Pancolonic diverticulosis.     09/07/2020 Imaging   Stable abnormal soft tissue density in the left pelvic sidewall. No new or progressive disease within the abdomen or pelvis.   Colonic diverticulosis. No radiographic evidence of diverticulitis.   Stable severe destructive left hip arthropathy with fracture involving the medial acetabular wall.     03/01/2021 Imaging   Stable abnormal soft tissue density in the left pelvic sidewall. 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   No new or progressive disease within the abdomen or pelvis.   Colonic diverticulosis, without radiographic evidence of diverticulitis.   Tiny hiatal hernia.   11/08/2021 Imaging   1. 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 2. Cannot exclude wall thickening in the lower rectum, correlate with digital rectal exam. 3. Other imaging findings of potential clinical significance: Small type 1 hiatal hernia. Colonic diverticulosis. Left common iliac vein and external iliac vein stent graft. 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  Diffuse idiopathic skeletal hyperostosis with multilevel foraminal impingement in the lumbar spine. Small umbilical hernia contains adipose tissue.   05/13/2022 Imaging   Old/treated left acetabular metastasis with chronic adjacent pelvic sidewall soft tissue thickening. No evidence of recurrent or progressive metastatic disease.   10/29/2022 Imaging   CT ABDOMEN PELVIS W CONTRAST  Result Date: 10/30/2022 CLINICAL DATA:  High risk endometrial cancer; monitor. Evaluate for recurrence. * Tracking Code: BO * EXAM: CT ABDOMEN AND PELVIS WITH CONTRAST TECHNIQUE: Multidetector CT imaging of the abdomen and pelvis was performed using the standard protocol following bolus administration of intravenous contrast. RADIATION DOSE REDUCTION: This exam was performed according to the departmental dose-optimization program which includes automated exposure control, adjustment of the mA and/or kV according to patient size and/or use of iterative reconstruction technique. CONTRAST:  OMNIPAQUE  IOHEXOL  300 MG/ML  SOLN COMPARISON:  Abdominopelvic CT 05/10/2022 and 11/06/2021. FINDINGS: Lower chest: Stable mild central airway thickening and lingular scarring. No confluent airspace opacity, suspicious pulmonary nodule or confluent airspace disease. Stable small hiatal hernia. Hepatobiliary: No focal hepatic abnormalities are identified. No evidence of biliary dilatation status post cholecystectomy. Pancreas: Unremarkable. No pancreatic ductal dilatation or surrounding inflammatory changes. Spleen: Normal in size without focal abnormality. Adrenals/Urinary Tract: Both adrenal glands appear normal. No evidence of urinary tract calculus, suspicious renal lesion or hydronephrosis. The bladder appears unremarkable for its degree of distention. Stomach/Bowel: No enteric contrast administered. As above, stable small hiatal hernia. The stomach otherwise appears unremarkable for its degree of distention. No bowel distension, wall  thickening or surrounding inflammation. The appendix appears normal. There is diffuse colonic diverticulosis without evidence of acute inflammation. Vascular/Lymphatic: There are no enlarged abdominal or pelvic lymph nodes. No acute vascular findings. Minimal aortoiliac  atherosclerosis. Previous stenting of the left common and external iliac veins. Reproductive: Status post hysterectomy. Low-density structure superior to the bladder on the right measuring 3.5 x 2.4 cm on image 57/3 is unchanged from the recent prior studies, although enlarged from older prior studies. No solid adnexal mass identified. Other: No ascites or peritoneal nodularity. No pneumoperitoneum. The abdominal wall appears intact. Stable mild subcutaneous edema. Musculoskeletal: No acute osseous findings or evidence of osseous metastatic disease. 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 Previous imaging has demonstrated a pathologic fracture in this area. There is stable chronic soft tissue thickening along the left pelvic sidewall. Multilevel spondylosis. Unless specific follow-up recommendations are mentioned in the findings or impression sections, no imaging follow-up of any mentioned incidental findings is recommended. IMPRESSION: 1. No definite evidence of local recurrence or metastatic disease in the abdomen or pelvis. 2. Indeterminate low-density structure superior to the bladder on the right, stable from recent prior studies, possibly a lymphocele or other benign finding. Correlate with tumor markers. Recommend attention on follow-up. 3. Diffuse colonic diverticulosis without evidence of acute inflammation. 4. Stable sequela of previously demonstrated pathologic left hip fracture with severe left hip arthropathy. 5.  Aortic Atherosclerosis (ICD10-I70.0). Electronically Signed   By: Elmon Hagedorn M.D.   On: 10/30/2022 15:57      04/29/2023 Imaging   1. Stable CT abdomen/pelvis. No findings  for abdominal/pelvic metastatic disease. 2. Stable 3.2 x 2.6 cm cystic lesion in the pelvis just superior to the bladder, most likely a postoperative fluid collection/seroma or liquified hematoma. 3. 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   Metastasis to lymph nodes (HCC)  06/23/2018 Initial Diagnosis   Metastasis to lymph nodes (HCC)   07/10/2018 - 08/24/2018 Chemotherapy   The patient had palonosetron  (ALOXI ) injection 0.25 mg, 0.25 mg, Intravenous,  Once, 3 of 6 cycles Administration: 0.25 mg (07/10/2018), 0.25 mg (07/31/2018), 0.25 mg (08/24/2018) CARBOplatin  (PARAPLATIN ) 480 mg in sodium chloride  0.9 % 250 mL chemo infusion, 480 mg (100 % of original dose 482.5 mg), Intravenous,  Once, 3 of 6 cycles Dose modification: 482.5 mg (original dose 482.5 mg, Cycle 1) Administration: 480 mg (07/10/2018), 480 mg (07/31/2018), 480 mg (08/24/2018) PACLitaxel  (TAXOL ) 276 mg in sodium chloride  0.9 % 250 mL chemo infusion (> 80mg /m2), 140 mg/m2 = 276 mg (80 % of original dose 175 mg/m2), Intravenous,  Once, 3 of 6 cycles Dose modification: 140 mg/m2 (80 % of original dose 175 mg/m2, Cycle 1, Reason: Dose Not Tolerated) Administration: 276 mg (07/10/2018), 276 mg (07/31/2018), 276 mg (08/24/2018) fosaprepitant  (EMEND ) 150 mg, dexamethasone  (DECADRON ) 12 mg in sodium chloride  0.9 % 145 mL IVPB, , Intravenous,  Once, 3 of 6 cycles Administration:  (07/10/2018),  (07/31/2018),  (08/24/2018)  for chemotherapy treatment.    11/06/2018 -  Chemotherapy   The patient had pembrolizumab  for chemotherapy treatment.     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 (HCC)  06/24/2018 Initial Diagnosis   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 (HCC)   07/10/2018 - 08/24/2018 Chemotherapy   The patient had palonosetron  (ALOXI ) injection 0.25 mg, 0.25 mg, Intravenous,  Once, 3 of 6 cycles Administration: 0.25 mg (07/10/2018), 0.25 mg (07/31/2018), 0.25 mg (08/24/2018) CARBOplatin  (PARAPLATIN ) 480 mg in sodium chloride  0.9 % 250 mL chemo infusion, 480 mg (100 %  of original dose 482.5 mg), Intravenous,  Once, 3 of 6 cycles Dose modification: 482.5 mg (original dose 482.5 mg, Cycle 1) Administration: 480 mg (07/10/2018), 480 mg (07/31/2018), 480 mg (08/24/2018) PACLitaxel  (TAXOL ) 276 mg  in sodium chloride  0.9 % 250 mL chemo infusion (> 80mg /m2), 140 mg/m2 = 276 mg (80 % of original dose 175 mg/m2), Intravenous,  Once, 3 of 6 cycles Dose modification: 140 mg/m2 (80 % of original dose 175 mg/m2, Cycle 1, Reason: Dose Not Tolerated) Administration: 276 mg (07/10/2018), 276 mg (07/31/2018), 276 mg (08/24/2018) fosaprepitant  (EMEND ) 150 mg, dexamethasone  (DECADRON ) 12 mg in sodium chloride  0.9 % 145 mL IVPB, , Intravenous,  Once, 3 of 6 cycles Administration:  (07/10/2018),  (07/31/2018),  (08/24/2018)  for chemotherapy treatment.    11/06/2018 -  Chemotherapy   The patient had pembrolizumab  for chemotherapy treatment.     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       Interval History: She reports that her son has a new diagnosis of stage IV lung cancer (to liver and bones). He was sent to the ED today so she will go there next. Not feeling weak. She otherwise denies vaginal bleeding, abdominal/pelvic pain, unintentional weight loss, change in bowel or bladder habits, early satiety, bloating. Reports weight gain.  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   Peripheral vascular disease (HCC)    Primary localized osteoarthritis of right knee    Uterine cancer (HCC) 06/23/2018    Past Surgical History:  Procedure Laterality Date   ABDOMINAL HYSTERECTOMY  2012   CHOLECYSTECTOMY N/A 03/09/2013   Procedure: LAPAROSCOPIC CHOLECYSTECTOMY;  Surgeon: Fran Imus, MD;  Location[facility] OR;  Service: General;  Laterality: N/A;   COLONOSCOPY W/ BIOPSIES     IR IMAGING GUIDED PORT INSERTION  06/29/2018   LARYNGOSCOPY Left 03/14/2017   Procedure: LARYNGOSCOPY;  Surgeon: Eldon Greenland, MD;  Location: [facility] OR;  Service: ENT;  Laterality: Left;   LOWER EXTREMITY VENOGRAPHY Left 01/19/2018   Procedure: LOWER EXTREMITY VENOGRAPHY;  Surgeon: Adine Hoof, MD;  Location: [facility] INVASIVE CV LAB;  Service: Cardiovascular;  Laterality: Left;   LOWER EXTREMITY VENOGRAPHY N/A 09/08/2018   Procedure: LOWER EXTREMITY VENOGRAPHY;  Surgeon: Adine Hoof, MD;  Location: [facility] INVASIVE CV LAB;  Service: Cardiovascular;  Laterality: N/A;   LYMPH NODE BIOPSY Left 06/18/2018   Procedure: EXCISIONAL BIOPSY LEFT INGUINAL LYMPH NODE;  Surgeon: Oza Blumenthal, MD;  Location: [facility] OR;  Service: General;  Laterality: Left;   PERIPHERAL VASCULAR INTERVENTION Left 01/19/2018   Procedure: PERIPHERAL VASCULAR INTERVENTION;  Surgeon: Adine Hoof, MD;  Location: [facility] INVASIVE CV LAB;  Service: Cardiovascular;  Laterality: Left;  LEFT ILIAC VENOUS   PERIPHERAL VASCULAR INTERVENTION Left 09/08/2018   Procedure: PERIPHERAL VASCULAR INTERVENTION;  Surgeon: Adine Hoof, MD;  Location: [facility] INVASIVE CV LAB;  Service: Cardiovascular;  Laterality: Left;  lower extremity  TOTAL KNEE ARTHROPLASTY  04/29/2011   Procedure: TOTAL KNEE ARTHROPLASTY;  Surgeon: Genevie Kerns, MD;  Location: [facility] OR;  Service:  Orthopedics;  Laterality: Left;  DR Alvia Awkward 90 MINUTES FOR THIS CASE   TOTAL KNEE ARTHROPLASTY Right 07/04/2014   Procedure: TOTAL KNEE ARTHROPLASTY;  Surgeon: Elly Habermann, MD;  Location: [facility] OR;  Service: Orthopedics;  Laterality: Right;    Family History  Problem Relation Age of Onset   Arthritis Mother    Hypertension Mother    Alzheimer's disease Father    Diabetes Sister    Hypertension Sister    Hypertension Sister    Hypertension Sister    Hypertension Sister    Hypertension Brother    Stroke Brother    Hypertension Brother    Breast cancer Niece 38       sister's daughter   Breast cancer Other        Niece   Anesthesia problems Neg Hx    Hypotension Neg Hx    Malignant hyperthermia Neg Hx    Pseudochol deficiency Neg Hx    Colon cancer Neg Hx    Ovarian cancer Neg Hx    Endometrial cancer Neg Hx    Pancreatic cancer Neg Hx    Prostate cancer Neg Hx     Social History   Socioeconomic History   Marital status: Divorced    Spouse name: Not on file   Number of children: 1   Years of education: Not on file   Highest education level: Not on file  Occupational History   Occupation: Retired   Tobacco Use   Smoking status: Former    Current packs/day: 0.00    Types: Cigarettes    Start date: 04/23/1987    Quit date: 04/22/1988    Years since quitting: 35.3   Smokeless tobacco: Never  Vaping Use   Vaping status: Never Used  Substance and Sexual Activity   Alcohol use: No   Drug use: No   Sexual activity: Not Currently    Birth control/protection: Surgical  Other Topics Concern   Not on file  Social History Narrative   Daily caffeine    Social Drivers of Corporate investment banker Strain: Not on file  Food Insecurity: Not on file  Transportation Needs: Not on file  Physical Activity: Not on file  Stress: Not on file  Social Connections: Not on file    Current Medications:  Current Outpatient Medications:    acetaminophen  (TYLENOL ) 325 MG  tablet, Take by mouth., Disp: , Rfl:    apixaban  (ELIQUIS ) 2.5 MG TABS tablet, Take by mouth 2 (two) times daily., Disp: , Rfl:    diclofenac  sodium (VOLTAREN ) 1 % GEL, APPLY 4GRAMS 4 TIMES A DAY AS NEEDED FOR PAINS, Disp: , Rfl:    furosemide  (LASIX ) 40 MG tablet, Take 40 mg by mouth daily as needed., Disp: , Rfl:    gabapentin  (NEURONTIN ) 300 MG capsule, Take 300 mg by mouth 3 (three) times daily., Disp: , Rfl:    lidocaine -prilocaine  (EMLA ) cream, Apply 1 application topically daily as needed., Disp: 30 g, Rfl: 3   losartan (COZAAR) 25 MG tablet, Take 12.5 mg by mouth daily., Disp: , Rfl:    methadone  (DOLOPHINE ) 10 MG tablet, Take 1 tablet (10 mg total) by mouth every 12 (twelve) hours., Disp: 60 tablet, Rfl: 0   Olopatadine  HCl 0.2 % SOLN, Place 1 drop into both eyes daily., Disp: , Rfl:    oxyCODONE  10 MG TABS,  Take 1 tablet (10 mg total) by mouth every 6 (six) hours as needed for severe pain., Disp: 60 tablet, Rfl: 0   potassium chloride  (KLOR-CON ) 10 MEQ tablet, Take 10 mEq by mouth daily., Disp: , Rfl:    Vitamin D, Ergocalciferol, (DRISDOL) 1.25 MG (50000 UNIT) CAPS capsule, SMARTSIG:1 Pill By Mouth Once a Week, Disp: , Rfl:   Review of Symptoms: Complete 10-system review is negative except as above in Interval History.  Physical Exam: BP 122/65 (BP Location: Left Arm, Patient Position: Sitting)   Pulse 75   Temp 97.9 F (36.6 C) (Oral)   Resp 18   Ht 5' 2.33 (1.583 m)   Wt 253 lb 6.4 oz (114.9 kg)   SpO2 100%   BMI 45.86 kg/m  General: Alert, oriented, no acute distress. HEENT: Normocephalic, atraumatic. Neck symmetric without masses. Sclera anicteric.  Chest: Normal work of breathing. Clear to auscultation bilaterally.   Cardiovascular: Regular rate and rhythm, no murmurs. Abdomen: Soft, nontender.  Normoactive bowel sounds.  No masses appreciated.  Well-healed incisions. Extremities: Using walker.  Some limited mobility due to left hip pain.  Darkened and thickened/rough  skin of bilateral lower extremities and edema equal bilaterally. Left lower leg wrapped. Skin:Improvement in skin in bilateral groins and pannus Lymphatics: No cervical, supraclavicular, adenopathy.   GU: Normal appearing external genitalia without erythema, excoriation, or lesions. Additional assistance required to hold left leg due to limited mobility and pain of left hip.  Speculum exam reveals normal vaginal mucosa, no lesions.  Bimanual exam reveals smooth vaginal cuff. Exam limited by body habitus.  Exam chaperoned by Kimberly Swaziland, CMA     Laboratory & Radiologic Studies: CT ABDOMEN PELVIS W CONTRAST 04/29/2023  Narrative CLINICAL DATA:  Restaging endometrial cancer.  * Tracking Code: BO *  EXAM: CT ABDOMEN AND PELVIS WITH CONTRAST  TECHNIQUE: Multidetector CT imaging of the abdomen and pelvis was performed using the standard protocol following bolus administration of intravenous contrast.  RADIATION DOSE REDUCTION: This exam was performed according to the departmental dose-optimization program which includes automated exposure control, adjustment of the mA and/or kV according to patient size and/or use of iterative reconstruction technique.  CONTRAST:  OMNIPAQUE  IOHEXOL  300 MG/ML  SOLN  COMPARISON:  Numerous prior imaging studies. Most recent is 10/25/2022  FINDINGS: Lower chest: Stable basilar scarring changes. No pleural or pericardial effusion. No worrisome pulmonary lesions or nodules.  Hepatobiliary: No focal hepatic lesions or intrahepatic biliary dilatation. The gallbladder is surgically absent. No common bile duct dilatation.  Pancreas: Unremarkable. No pancreatic ductal dilatation or surrounding inflammatory changes.  Spleen: Normal in size without focal abnormality.  Adrenals/Urinary Tract: Adrenal glands are unremarkable. Kidneys are normal, without renal calculi, focal lesion, or hydronephrosis. Bladder is unremarkable.  Stomach/Bowel: The  stomach, duodenum, small bowel and colon are grossly normal without oral contrast. No inflammatory changes, mass lesions or obstructive findings. The appendix is normal. Stable severe colonic diverticulosis. Stable 3.2 x 2.6 cm cystic lesion in the pelvis just superior to the bladder, most likely a postoperative fluid collection/seroma or liquified hematoma.  Vascular/Lymphatic: Normal aorta. The branch vessels are patent. Stable left external iliac venous stent. No abdominal or pelvic lymphadenopathy.  Reproductive: Surgically absent.  Other: No pelvic mass or adenopathy. No free pelvic fluid collections. No inguinal mass or adenopathy. No abdominal wall hernia or subcutaneous lesions.  Musculoskeletal: No significant bony findings. Stable degenerative changes involving the spine. 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  IMPRESSION: 1. Stable CT  abdomen/pelvis. No findings for abdominal/pelvic metastatic disease. 2. Stable 3.2 x 2.6 cm cystic lesion in the pelvis just superior to the bladder, most likely a postoperative fluid collection/seroma or liquified hematoma. 3. 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   Electronically Signed By: Marrian Siva M.D. On: 05/12/2023 14:45

## 2023-08-11 ENCOUNTER — Other Ambulatory Visit: Payer: Self-pay | Admitting: Family Medicine

## 2023-08-11 DIAGNOSIS — I82503 Chronic embolism and thrombosis of unspecified deep veins of lower extremity, bilateral: Secondary | ICD-10-CM

## 2023-09-02 ENCOUNTER — Inpatient Hospital Stay: Attending: Hematology and Oncology

## 2023-09-02 ENCOUNTER — Telehealth: Payer: Self-pay | Admitting: *Deleted

## 2023-09-02 VITALS — BP 141/62 | HR 89 | Temp 98.2°F | Resp 19

## 2023-09-02 DIAGNOSIS — Z8542 Personal history of malignant neoplasm of other parts of uterus: Secondary | ICD-10-CM | POA: Diagnosis present

## 2023-09-02 DIAGNOSIS — C55 Malignant neoplasm of uterus, part unspecified: Secondary | ICD-10-CM

## 2023-09-02 MED ORDER — HEPARIN SOD (PORK) LOCK FLUSH 100 UNIT/ML IV SOLN
500.0000 [IU] | Freq: Once | INTRAVENOUS | Status: AC
  Administered 2023-09-02: 500 [IU]

## 2023-09-02 MED ORDER — SODIUM CHLORIDE 0.9% FLUSH
10.0000 mL | Freq: Once | INTRAVENOUS | Status: AC
  Administered 2023-09-02: 10 mL

## 2023-09-02 NOTE — Telephone Encounter (Signed)
 Called pt with message below. Contact information was given for CS. Pt verbalized understanding.

## 2023-09-02 NOTE — Telephone Encounter (Signed)
-----   Message from Almarie Bedford sent at 09/02/2023  8:19 AM EDT ----- Can you call her and remind her to call for CT on 9/9 after her labs/flush appt Also start the tracking sheet for CT

## 2023-09-12 ENCOUNTER — Other Ambulatory Visit: Payer: Self-pay

## 2023-09-12 ENCOUNTER — Encounter (HOSPITAL_COMMUNITY): Payer: Self-pay | Admitting: *Deleted

## 2023-09-12 ENCOUNTER — Emergency Department (HOSPITAL_COMMUNITY): Admission: EM | Admit: 2023-09-12 | Discharge: 2023-09-12 | Disposition: A | Discharge status: Home / Self Care

## 2023-09-12 DIAGNOSIS — R42 Dizziness and giddiness: Secondary | ICD-10-CM | POA: Diagnosis not present

## 2023-09-12 DIAGNOSIS — R112 Nausea with vomiting, unspecified: Secondary | ICD-10-CM | POA: Diagnosis present

## 2023-09-12 DIAGNOSIS — R110 Nausea: Secondary | ICD-10-CM

## 2023-09-12 DIAGNOSIS — Z8542 Personal history of malignant neoplasm of other parts of uterus: Secondary | ICD-10-CM | POA: Diagnosis not present

## 2023-09-12 DIAGNOSIS — E860 Dehydration: Secondary | ICD-10-CM | POA: Diagnosis not present

## 2023-09-12 DIAGNOSIS — N3000 Acute cystitis without hematuria: Secondary | ICD-10-CM | POA: Diagnosis not present

## 2023-09-12 LAB — COMPREHENSIVE METABOLIC PANEL WITH GFR
ALT: 11 U/L (ref 0–44)
AST: 21 U/L (ref 15–41)
Albumin: 4 g/dL (ref 3.5–5.0)
Alkaline Phosphatase: 80 U/L (ref 38–126)
Anion gap: 9 (ref 5–15)
BUN: 18 mg/dL (ref 8–23)
CO2: 24 mmol/L (ref 22–32)
Calcium: 9.3 mg/dL (ref 8.9–10.3)
Chloride: 105 mmol/L (ref 98–111)
Creatinine, Ser: 0.67 mg/dL (ref 0.44–1.00)
GFR, Estimated: >60 mL/min (ref >60)
Glucose, Bld: 115 mg/dL — ABNORMAL HIGH (ref 70–99)
Potassium: 4.2 mmol/L (ref 3.5–5.1)
Sodium: 138 mmol/L (ref 135–145)
Total Bilirubin: 1.1 mg/dL (ref 0.0–1.2)
Total Protein: 8.6 g/dL — ABNORMAL HIGH (ref 6.5–8.1)

## 2023-09-12 LAB — URINALYSIS, ROUTINE W REFLEX MICROSCOPIC
Bilirubin Urine: NEGATIVE
Glucose, UA: NEGATIVE mg/dL
Ketones, ur: 5 mg/dL — AB
Nitrite: POSITIVE — AB
Protein, ur: NEGATIVE mg/dL
Specific Gravity, Urine: 1.018 (ref 1.005–1.030)
pH: 6 (ref 5.0–8.0)

## 2023-09-12 LAB — CBC
HCT: 38.3 % (ref 36.0–46.0)
Hemoglobin: 11.7 g/dL — ABNORMAL LOW (ref 12.0–15.0)
MCH: 27.7 pg (ref 26.0–34.0)
MCHC: 30.5 g/dL (ref 30.0–36.0)
MCV: 90.5 fL (ref 80.0–100.0)
Platelets: 253 K/uL (ref 150–400)
RBC: 4.23 MIL/uL (ref 3.87–5.11)
RDW: 14.5 % (ref 11.5–15.5)
WBC: 4.9 K/uL (ref 4.0–10.5)
nRBC: 0 % (ref 0.0–0.2)

## 2023-09-12 LAB — LIPASE, BLOOD: Lipase: 25 U/L (ref 11–51)

## 2023-09-12 MED ORDER — ONDANSETRON HCL 4 MG/2ML IJ SOLN
4.0000 mg | Freq: Once | INTRAMUSCULAR | Status: AC
  Administered 2023-09-12: 4 mg via INTRAVENOUS
  Filled 2023-09-12: qty 2

## 2023-09-12 MED ORDER — CEPHALEXIN 500 MG PO CAPS: 500.0000 mg | ORAL_CAPSULE | Freq: Four times a day (QID) | ORAL | 0 refills | Status: AC

## 2023-09-12 MED ORDER — CEPHALEXIN 500 MG PO CAPS
500.0000 mg | ORAL_CAPSULE | Freq: Once | ORAL | Status: AC
  Administered 2023-09-12: 500 mg via ORAL
  Filled 2023-09-12: qty 1

## 2023-09-12 MED ORDER — ONDANSETRON 4 MG PO TBDP
4.0000 mg | ORAL_TABLET | Freq: Once | ORAL | Status: AC | PRN
  Administered 2023-09-12: 4 mg via ORAL
  Filled 2023-09-12: qty 1

## 2023-09-12 MED ORDER — ONDANSETRON HCL 4 MG PO TABS: 4.0000 mg | ORAL_TABLET | Freq: Three times a day (TID) | ORAL | Status: AC | PRN

## 2023-09-12 MED ORDER — SODIUM CHLORIDE 0.9 % IV BOLUS
500.0000 mL | Freq: Once | INTRAVENOUS | Status: AC
  Administered 2023-09-12: 500 mL via INTRAVENOUS

## 2023-09-12 NOTE — ED Triage Notes (Signed)
 Pt has had dizziness and nausea today. States she feels this way when she is dehydrated.  Vomited x 1 today

## 2023-09-12 NOTE — Discharge Instructions (Addendum)
 Take your antibiotics as prescribed for UTI.  Take your Zofran  as needed for nausea and vomiting.  Follow-up with primary care.  Drink plenty of fluids and return to the ER for any new or worsening symptoms.

## 2023-09-12 NOTE — ED Provider Notes (Signed)
 [place] [facility] AT [facility] Provider Note   CSN: [id number] Arrival date & time: 09/12/23  1622     Patient presents with: Dizziness and Emesis   [name] is a 78 y.o. female.   78 year old female presents for evaluation of dizziness.  She is worried she is dehydrated.  States she was at the cancer center earlier and had a step outside she got very nauseous and threw up there.  States she threw appears well.  States she feels lightheaded like she might faint at times.  States this happens when I get dehydrated.  She denies any diarrhea, abdominal pain, chest pain, or any other symptoms or concerns at this time.   Dizziness Associated symptoms: vomiting   Associated symptoms: no chest pain, no palpitations and no shortness of breath   Emesis Associated symptoms: no abdominal pain, no arthralgias, no chills, no cough, no fever and no sore throat        Prior to Admission medications   Medication Sig Start Date End Date Taking? Authorizing Provider  cephALEXin  (KEFLEX ) 500 MG capsule Take 1 capsule (500 mg total) by mouth 4 (four) times daily for 7 days. 09/12/23 09/19/23 Yes Athanasios Heldman L, DO  ondansetron  (ZOFRAN ) 4 MG tablet Take 1 tablet (4 mg total) by mouth every 8 (eight) hours as needed for up to 4 days for nausea or vomiting. 09/12/23 09/16/23 Yes Jannely Henthorn L, DO  acetaminophen  (TYLENOL ) 325 MG tablet Take by mouth. 07/06/14   [provider]  apixaban  (ELIQUIS ) 2.5 MG TABS tablet Take by mouth 2 (two) times daily.    [provider]  diclofenac  sodium (VOLTAREN ) 1 % GEL APPLY 4GRAMS 4 TIMES A DAY AS NEEDED FOR PAINS 11/05/18   [provider]  furosemide  (LASIX ) 40 MG tablet Take 40 mg by mouth daily as needed. 10/03/21   [provider]  gabapentin  (NEURONTIN ) 300 MG capsule Take 300 mg by mouth 3 (three) times daily. 07/06/19   [provider]  lidocaine -prilocaine  (EMLA ) cream Apply 1  application topically daily as needed. 03/23/21   Lonn Hicks, MD  losartan (COZAAR) 25 MG tablet Take 12.5 mg by mouth daily. 03/03/23   [provider]  methadone  (DOLOPHINE ) 10 MG tablet Take 1 tablet (10 mg total) by mouth every 12 (twelve) hours. 10/20/20   Lonn Hicks, MD  Olopatadine  HCl 0.2 % SOLN Place 1 drop into both eyes daily. 12/06/17   [provider]  oxyCODONE  10 MG TABS Take 1 tablet (10 mg total) by mouth every 6 (six) hours as needed for severe pain. 10/31/22   Lonn Hicks, MD  potassium chloride  (KLOR-CON ) 10 MEQ tablet Take 10 mEq by mouth daily. 10/03/21   [provider]  Vitamin D, Ergocalciferol, (DRISDOL) 1.25 MG (50000 UNIT) CAPS capsule SMARTSIG:1 Pill By Mouth Once a Week 11/13/22   [provider]    Allergies: Patient has no known allergies.    Review of Systems  Constitutional:  Negative for chills and fever.  HENT:  Negative for ear pain and sore throat.   Eyes:  Negative for pain and visual disturbance.  Respiratory:  Negative for cough and shortness of breath.   Cardiovascular:  Negative for chest pain and palpitations.  Gastrointestinal:  Positive for vomiting. Negative for abdominal pain.  Genitourinary:  Negative for dysuria and hematuria.  Musculoskeletal:  Negative for arthralgias and back pain.  Skin:  Negative for color change and rash.  Neurological:  Positive  for dizziness. Negative for seizures and syncope.  All other systems reviewed and are negative.   Updated Vital Signs BP (!) 145/61 (BP Location: Left Arm)   Pulse 70   Temp 98 F (36.7 C) (Oral)   Resp 18   Ht 5' (1.524 m)   Wt 108.9 kg   SpO2 99%   BMI 46.87 kg/m   Physical Exam Vitals and nursing note reviewed.  Constitutional:      General: She is not in acute distress.    Appearance: Normal appearance. She is well-developed. She is not ill-appearing.  HENT:     Head: Normocephalic and atraumatic.  Eyes:     Conjunctiva/sclera: Conjunctivae  normal.  Cardiovascular:     Rate and Rhythm: Normal rate and regular rhythm.     Pulses: Normal pulses.     Heart sounds: Normal heart sounds. No murmur heard. Pulmonary:     Effort: Pulmonary effort is normal. No respiratory distress.     Breath sounds: Normal breath sounds.  Abdominal:     General: There is no distension.     Palpations: Abdomen is soft. There is no mass.     Tenderness: There is no abdominal tenderness.     Hernia: No hernia is present.  Musculoskeletal:        General: No swelling.     Cervical back: Neck supple.  Skin:    General: Skin is warm and dry.     Capillary Refill: Capillary refill takes less than 2 seconds.  Neurological:     Mental Status: She is alert.  Psychiatric:        Mood and Affect: Mood normal.     (all labs ordered are listed, but only abnormal results are displayed) Labs Reviewed  COMPREHENSIVE METABOLIC PANEL WITH GFR - Abnormal; Notable for the following components:      Result Value   Glucose, Bld 115 (*)    Total Protein 8.6 (*)    All other components within normal limits  CBC - Abnormal; Notable for the following components:   Hemoglobin 11.7 (*)    All other components within normal limits  URINALYSIS, ROUTINE W REFLEX MICROSCOPIC - Abnormal; Notable for the following components:   APPearance HAZY (*)    Hgb urine dipstick MODERATE (*)    Ketones, ur 5 (*)    Nitrite POSITIVE (*)    Leukocytes,Ua SMALL (*)    Bacteria, UA FEW (*)    All other components within normal limits  LIPASE, BLOOD    EKG: None  Radiology: No results found.   Procedures   Medications Ordered in the ED  ondansetron  (ZOFRAN -ODT) disintegrating tablet 4 mg (4 mg Oral Given 09/12/23 1700)  ondansetron  (ZOFRAN ) injection 4 mg (4 mg Intravenous Given 09/12/23 2007)  sodium chloride  0.9 % bolus 500 mL (0 mLs Intravenous Stopped 09/12/23 2222)  cephALEXin  (KEFLEX ) capsule 500 mg (500 mg Oral Given 09/12/23 2108)                                     Medical Decision Making Patient's lab work is reviewed by me and she does have a UTI but labs are fairly unremarkable otherwise.  Was given fluids Zofran  and a dose of Keflex .  I will prescribe her Keflex  and Zofran  she is vies increase her fluid intake.  She is otherwise feeling well, has stable vital signs and is comfortable being discharged  home.  Advise follow-up with primary care and otherwise return to the ER for any worsening symptoms.  Problems Addressed: Acute cystitis without hematuria: acute illness or injury Dehydration: acute illness or injury Nausea: acute illness or injury  Amount and/or Complexity of Data Reviewed External Data Reviewed: notes.    Details: Outpatient records reviewed and patient does follow with [facility] for endometrial neoplasm Labs: ordered. Decision-making details documented in ED Course.    Details: Ordered and reviewed by me and patient is a UTI but labs are otherwise fairly unremarkable  Risk OTC drugs. Prescription drug management. Drug therapy requiring intensive monitoring for toxicity.    Final diagnoses:  Acute cystitis without hematuria  Dehydration  Nausea    ED Discharge Orders          Ordered    cephALEXin  (KEFLEX ) 500 MG capsule  4 times daily        09/12/23 2126    ondansetron  (ZOFRAN ) 4 MG tablet  Every 8 hours PRN        09/12/23 2126               Trenee Igoe L, DO 09/12/23 2300

## 2023-09-12 NOTE — ED Notes (Signed)
 Pt reports her nausea has resolved since being medicated. Pt denies any dizziness at rest, but reports when I stand up I get dizzy. Dispo pending

## 2023-09-15 ENCOUNTER — Telehealth: Payer: Self-pay

## 2023-09-15 NOTE — Telephone Encounter (Signed)
 Called to check in on patient following ED visit on Friday. Patient reports feeling much better. Patient is eating and drinking well. Not experiencing any n/v/d at this time. No further issues with dizziness or falls. Patient knows to call the office should she have any questions or concerns. Patient verbalized an understanding of the information and voiced appreciation for the call. Dr. Lonn updated.

## 2023-09-22 ENCOUNTER — Encounter (HOSPITAL_BASED_OUTPATIENT_CLINIC_OR_DEPARTMENT_OTHER): Attending: Internal Medicine | Admitting: Internal Medicine

## 2023-09-22 DIAGNOSIS — I890 Lymphedema, not elsewhere classified: Secondary | ICD-10-CM | POA: Insufficient documentation

## 2023-09-22 DIAGNOSIS — L97822 Non-pressure chronic ulcer of other part of left lower leg with fat layer exposed: Secondary | ICD-10-CM | POA: Insufficient documentation

## 2023-09-22 DIAGNOSIS — I87312 Chronic venous hypertension (idiopathic) with ulcer of left lower extremity: Secondary | ICD-10-CM | POA: Diagnosis present

## 2023-09-29 ENCOUNTER — Encounter (HOSPITAL_BASED_OUTPATIENT_CLINIC_OR_DEPARTMENT_OTHER): Admitting: Internal Medicine

## 2023-09-29 DIAGNOSIS — I87312 Chronic venous hypertension (idiopathic) with ulcer of left lower extremity: Secondary | ICD-10-CM | POA: Diagnosis not present

## 2023-10-06 ENCOUNTER — Encounter (HOSPITAL_BASED_OUTPATIENT_CLINIC_OR_DEPARTMENT_OTHER): Admitting: Internal Medicine

## 2023-10-06 DIAGNOSIS — I890 Lymphedema, not elsewhere classified: Secondary | ICD-10-CM | POA: Diagnosis not present

## 2023-10-06 DIAGNOSIS — L97822 Non-pressure chronic ulcer of other part of left lower leg with fat layer exposed: Secondary | ICD-10-CM

## 2023-10-06 DIAGNOSIS — I87312 Chronic venous hypertension (idiopathic) with ulcer of left lower extremity: Secondary | ICD-10-CM | POA: Diagnosis not present

## 2023-10-13 ENCOUNTER — Encounter (HOSPITAL_BASED_OUTPATIENT_CLINIC_OR_DEPARTMENT_OTHER): Admitting: Internal Medicine

## 2023-10-13 DIAGNOSIS — I890 Lymphedema, not elsewhere classified: Secondary | ICD-10-CM | POA: Diagnosis not present

## 2023-10-13 DIAGNOSIS — L97822 Non-pressure chronic ulcer of other part of left lower leg with fat layer exposed: Secondary | ICD-10-CM | POA: Diagnosis not present

## 2023-10-13 DIAGNOSIS — I87312 Chronic venous hypertension (idiopathic) with ulcer of left lower extremity: Secondary | ICD-10-CM | POA: Diagnosis not present

## 2023-10-22 ENCOUNTER — Encounter (HOSPITAL_BASED_OUTPATIENT_CLINIC_OR_DEPARTMENT_OTHER): Attending: Internal Medicine | Admitting: Internal Medicine

## 2023-10-22 DIAGNOSIS — I87312 Chronic venous hypertension (idiopathic) with ulcer of left lower extremity: Secondary | ICD-10-CM | POA: Diagnosis not present

## 2023-10-22 DIAGNOSIS — I890 Lymphedema, not elsewhere classified: Secondary | ICD-10-CM | POA: Insufficient documentation

## 2023-10-22 DIAGNOSIS — L97822 Non-pressure chronic ulcer of other part of left lower leg with fat layer exposed: Secondary | ICD-10-CM | POA: Insufficient documentation

## 2023-10-28 ENCOUNTER — Other Ambulatory Visit: Payer: Self-pay

## 2023-10-28 ENCOUNTER — Ambulatory Visit (HOSPITAL_COMMUNITY)
Admission: RE | Admit: 2023-10-28 | Discharge: 2023-10-28 | Disposition: A | Discharge status: Home / Self Care | Source: Ambulatory Visit | Attending: Hematology and Oncology

## 2023-10-28 ENCOUNTER — Encounter (HOSPITAL_COMMUNITY): Payer: Self-pay

## 2023-10-28 ENCOUNTER — Inpatient Hospital Stay: Attending: Hematology and Oncology

## 2023-10-28 DIAGNOSIS — Z923 Personal history of irradiation: Secondary | ICD-10-CM | POA: Insufficient documentation

## 2023-10-28 DIAGNOSIS — I825Z2 Chronic embolism and thrombosis of unspecified deep veins of left distal lower extremity: Secondary | ICD-10-CM | POA: Diagnosis not present

## 2023-10-28 DIAGNOSIS — Z9221 Personal history of antineoplastic chemotherapy: Secondary | ICD-10-CM | POA: Diagnosis not present

## 2023-10-28 DIAGNOSIS — C55 Malignant neoplasm of uterus, part unspecified: Secondary | ICD-10-CM

## 2023-10-28 DIAGNOSIS — D638 Anemia in other chronic diseases classified elsewhere: Secondary | ICD-10-CM | POA: Insufficient documentation

## 2023-10-28 DIAGNOSIS — G8929 Other chronic pain: Secondary | ICD-10-CM | POA: Diagnosis not present

## 2023-10-28 DIAGNOSIS — Z8542 Personal history of malignant neoplasm of other parts of uterus: Secondary | ICD-10-CM | POA: Diagnosis present

## 2023-10-28 DIAGNOSIS — E039 Hypothyroidism, unspecified: Secondary | ICD-10-CM

## 2023-10-28 DIAGNOSIS — D539 Nutritional anemia, unspecified: Secondary | ICD-10-CM | POA: Insufficient documentation

## 2023-10-28 LAB — COMPREHENSIVE METABOLIC PANEL WITH GFR
ALT: 8 U/L (ref 0–44)
AST: 10 U/L — ABNORMAL LOW (ref 15–41)
Albumin: 3.9 g/dL (ref 3.5–5.0)
Alkaline Phosphatase: 76 U/L (ref 38–126)
Anion gap: 5 (ref 5–15)
BUN: 11 mg/dL (ref 8–23)
CO2: 26 mmol/L (ref 22–32)
Calcium: 9 mg/dL (ref 8.9–10.3)
Chloride: 109 mmol/L (ref 98–111)
Creatinine, Ser: 0.64 mg/dL (ref 0.44–1.00)
GFR, Estimated: >60 mL/min (ref >60)
Glucose, Bld: 91 mg/dL (ref 70–99)
Potassium: 3.8 mmol/L (ref 3.5–5.1)
Sodium: 140 mmol/L (ref 135–145)
Total Bilirubin: 0.4 mg/dL (ref 0.0–1.2)
Total Protein: 7.4 g/dL (ref 6.5–8.1)

## 2023-10-28 LAB — CBC WITH DIFFERENTIAL/PLATELET
Abs Immature Granulocytes: 0.01 K/uL (ref 0.00–0.07)
Basophils Absolute: 0 K/uL (ref 0.0–0.1)
Basophils Relative: 1 %
Eosinophils Absolute: 0.1 K/uL (ref 0.0–0.5)
Eosinophils Relative: 3 %
HCT: 33.4 % — ABNORMAL LOW (ref 36.0–46.0)
Hemoglobin: 10.7 g/dL — ABNORMAL LOW (ref 12.0–15.0)
Immature Granulocytes: 0 %
Lymphocytes Relative: 28 %
Lymphs Abs: 1 K/uL (ref 0.7–4.0)
MCH: 27.9 pg (ref 26.0–34.0)
MCHC: 32 g/dL (ref 30.0–36.0)
MCV: 87.2 fL (ref 80.0–100.0)
Monocytes Absolute: 0.3 K/uL (ref 0.1–1.0)
Monocytes Relative: 9 %
Neutro Abs: 2.1 K/uL (ref 1.7–7.7)
Neutrophils Relative %: 59 %
Platelets: 229 K/uL (ref 150–400)
RBC: 3.83 MIL/uL — ABNORMAL LOW (ref 3.87–5.11)
RDW: 14.6 % (ref 11.5–15.5)
WBC: 3.5 K/uL — ABNORMAL LOW (ref 4.0–10.5)
nRBC: 0 % (ref 0.0–0.2)

## 2023-10-28 LAB — TSH: TSH: 2.48 u[IU]/mL (ref 0.350–4.500)

## 2023-10-28 MED ORDER — HEPARIN SOD (PORK) LOCK FLUSH 100 UNIT/ML IV SOLN
INTRAVENOUS | Status: AC
Start: 2023-10-28 — End: 2023-10-28
  Filled 2023-10-28: qty 5

## 2023-10-28 MED ORDER — IOHEXOL 300 MG/ML  SOLN
100.0000 mL | Freq: Once | INTRAMUSCULAR | Status: AC | PRN
  Administered 2023-10-28: 100 mL via INTRAVENOUS

## 2023-10-28 MED ORDER — HEPARIN SOD (PORK) LOCK FLUSH 100 UNIT/ML IV SOLN
500.0000 [IU] | Freq: Once | INTRAVENOUS | Status: AC
Start: 2023-10-28 — End: 2023-10-28
  Administered 2023-10-28: 500 [IU] via INTRAVENOUS

## 2023-10-29 ENCOUNTER — Encounter (HOSPITAL_BASED_OUTPATIENT_CLINIC_OR_DEPARTMENT_OTHER): Admitting: Internal Medicine

## 2023-10-29 DIAGNOSIS — L97822 Non-pressure chronic ulcer of other part of left lower leg with fat layer exposed: Secondary | ICD-10-CM | POA: Diagnosis not present

## 2023-10-29 DIAGNOSIS — I87312 Chronic venous hypertension (idiopathic) with ulcer of left lower extremity: Secondary | ICD-10-CM | POA: Diagnosis not present

## 2023-10-30 ENCOUNTER — Ambulatory Visit (HOSPITAL_BASED_OUTPATIENT_CLINIC_OR_DEPARTMENT_OTHER): Admitting: Internal Medicine

## 2023-11-04 ENCOUNTER — Inpatient Hospital Stay (HOSPITAL_BASED_OUTPATIENT_CLINIC_OR_DEPARTMENT_OTHER): Admitting: Hematology and Oncology

## 2023-11-04 ENCOUNTER — Encounter: Payer: Self-pay | Admitting: Hematology and Oncology

## 2023-11-04 VITALS — BP 129/77 | HR 84 | Temp 97.8°F | Resp 18 | Ht 60.0 in | Wt 258.8 lb

## 2023-11-04 DIAGNOSIS — C55 Malignant neoplasm of uterus, part unspecified: Secondary | ICD-10-CM | POA: Diagnosis not present

## 2023-11-04 DIAGNOSIS — D539 Nutritional anemia, unspecified: Secondary | ICD-10-CM

## 2023-11-04 DIAGNOSIS — I825Z2 Chronic embolism and thrombosis of unspecified deep veins of left distal lower extremity: Secondary | ICD-10-CM | POA: Diagnosis not present

## 2023-11-04 DIAGNOSIS — Z8542 Personal history of malignant neoplasm of other parts of uterus: Secondary | ICD-10-CM | POA: Diagnosis not present

## 2023-11-04 NOTE — Assessment & Plan Note (Addendum)
 She is taking both anticoagulation therapy  She has no recent bleeding She will continue her medications as directed

## 2023-11-04 NOTE — Assessment & Plan Note (Addendum)
 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

## 2023-11-04 NOTE — Assessment & Plan Note (Addendum)
 Due to anemia chronic illness She is not symptomatic Observe only

## 2023-11-04 NOTE — Progress Notes (Signed)
 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 MMR: abnormal, MSI: High, Genetics are negative She received 3 doses of carboplatin  and paclitaxel  completed in 2020 along with radiation therapy followed by maintenance pembrolizumab  until 2023  She is doing well off maintenance treatment I reviewed CT imaging from September 2025 which showed no evidence of recurrent disease I recommend port removal I plan to see her again in 1 year for further follow-up with repeat labs Deficiency anemia Due to anemia chronic illness She is not symptomatic Observe only Lower leg DVT (deep venous thromboembolism), chronic, left (HCC) She is taking both anticoagulation therapy  She has no recent bleeding She will continue her medications as directed   Orders Placed This Encounter  Procedures   IR REMOVAL TUN ACCESS W/ PORT W/O FL MOD SED    Standing Status:   Future    Expected Date:   11/11/2023    Expiration Date:   11/03/2024    Reason for exam::   no need port    Preferred Imaging Location?:   [facility]   CT ABDOMEN PELVIS W CONTRAST    Standing Status:   Future    Expected Date:   10/19/2024    Expiration Date:   11/03/2024    Scheduling Instructions:     No oral contrast    If indicated for the ordered procedure, I authorize the administration of contrast media per Radiology protocol:   Yes    Does the patient have a contrast media/X-ray dye allergy?:   No    Preferred imaging location?:   [facility]    If indicated for the ordered procedure, I authorize the administration of oral contrast media per Radiology protocol:   No    Reason for no oral contrast::   No need oral contrast     Leslie Bedford, MD  INTERVAL HISTORY: she returns for surveillance follow-up for recurrent uterine cancer She is doing well Her chronic pain is stable She denies new symptoms to suggest cancer recurrence We reviewed CBC, CMP, TSH and CT imaging  PHYSICAL EXAMINATION: ECOG PERFORMANCE STATUS: 0 - Asymptomatic  Vitals:   11/04/23 1028  BP: 129/77  Pulse: 84  Resp: 18  Temp: 97.8 F (36.6 C)  SpO2: 99%   Filed Weights   11/04/23 1028  Weight: 258 lb 12.8 oz (117.4 kg)

## 2023-11-05 ENCOUNTER — Encounter (HOSPITAL_BASED_OUTPATIENT_CLINIC_OR_DEPARTMENT_OTHER): Admitting: Internal Medicine

## 2023-11-05 DIAGNOSIS — I890 Lymphedema, not elsewhere classified: Secondary | ICD-10-CM | POA: Diagnosis not present

## 2023-11-05 DIAGNOSIS — I87312 Chronic venous hypertension (idiopathic) with ulcer of left lower extremity: Secondary | ICD-10-CM | POA: Diagnosis not present

## 2023-11-05 DIAGNOSIS — L97822 Non-pressure chronic ulcer of other part of left lower leg with fat layer exposed: Secondary | ICD-10-CM | POA: Diagnosis not present

## 2023-11-12 ENCOUNTER — Encounter (HOSPITAL_BASED_OUTPATIENT_CLINIC_OR_DEPARTMENT_OTHER): Admitting: Internal Medicine

## 2023-11-12 DIAGNOSIS — L97822 Non-pressure chronic ulcer of other part of left lower leg with fat layer exposed: Secondary | ICD-10-CM | POA: Diagnosis not present

## 2023-11-12 DIAGNOSIS — I890 Lymphedema, not elsewhere classified: Secondary | ICD-10-CM

## 2023-11-12 DIAGNOSIS — I87312 Chronic venous hypertension (idiopathic) with ulcer of left lower extremity: Secondary | ICD-10-CM | POA: Diagnosis not present

## 2023-11-20 ENCOUNTER — Encounter (HOSPITAL_BASED_OUTPATIENT_CLINIC_OR_DEPARTMENT_OTHER): Attending: Internal Medicine | Admitting: Internal Medicine

## 2023-11-20 DIAGNOSIS — Z86718 Personal history of other venous thrombosis and embolism: Secondary | ICD-10-CM | POA: Insufficient documentation

## 2023-11-20 DIAGNOSIS — I890 Lymphedema, not elsewhere classified: Secondary | ICD-10-CM | POA: Insufficient documentation

## 2023-11-20 DIAGNOSIS — L97822 Non-pressure chronic ulcer of other part of left lower leg with fat layer exposed: Secondary | ICD-10-CM | POA: Diagnosis not present

## 2023-11-20 DIAGNOSIS — I87312 Chronic venous hypertension (idiopathic) with ulcer of left lower extremity: Secondary | ICD-10-CM | POA: Diagnosis not present

## 2023-11-28 ENCOUNTER — Encounter (HOSPITAL_BASED_OUTPATIENT_CLINIC_OR_DEPARTMENT_OTHER): Admitting: Internal Medicine

## 2023-11-28 DIAGNOSIS — I87312 Chronic venous hypertension (idiopathic) with ulcer of left lower extremity: Secondary | ICD-10-CM

## 2023-11-28 DIAGNOSIS — L97822 Non-pressure chronic ulcer of other part of left lower leg with fat layer exposed: Secondary | ICD-10-CM | POA: Diagnosis not present

## 2023-11-28 DIAGNOSIS — I890 Lymphedema, not elsewhere classified: Secondary | ICD-10-CM

## 2023-12-05 ENCOUNTER — Encounter (HOSPITAL_BASED_OUTPATIENT_CLINIC_OR_DEPARTMENT_OTHER): Admitting: Internal Medicine

## 2023-12-05 DIAGNOSIS — L97822 Non-pressure chronic ulcer of other part of left lower leg with fat layer exposed: Secondary | ICD-10-CM

## 2023-12-05 DIAGNOSIS — I87312 Chronic venous hypertension (idiopathic) with ulcer of left lower extremity: Secondary | ICD-10-CM | POA: Diagnosis not present

## 2023-12-05 DIAGNOSIS — I890 Lymphedema, not elsewhere classified: Secondary | ICD-10-CM | POA: Diagnosis not present

## 2023-12-09 ENCOUNTER — Ambulatory Visit
Admission: RE | Admit: 2023-12-09 | Discharge: 2023-12-09 | Disposition: A | Source: Ambulatory Visit | Attending: Family Medicine

## 2023-12-09 ENCOUNTER — Other Ambulatory Visit: Payer: Self-pay | Admitting: Family Medicine

## 2023-12-09 DIAGNOSIS — R053 Chronic cough: Secondary | ICD-10-CM

## 2023-12-12 ENCOUNTER — Encounter (HOSPITAL_BASED_OUTPATIENT_CLINIC_OR_DEPARTMENT_OTHER): Admitting: Internal Medicine

## 2023-12-12 DIAGNOSIS — L97822 Non-pressure chronic ulcer of other part of left lower leg with fat layer exposed: Secondary | ICD-10-CM

## 2023-12-12 DIAGNOSIS — I890 Lymphedema, not elsewhere classified: Secondary | ICD-10-CM

## 2023-12-12 DIAGNOSIS — I87312 Chronic venous hypertension (idiopathic) with ulcer of left lower extremity: Secondary | ICD-10-CM | POA: Diagnosis not present

## 2023-12-19 ENCOUNTER — Encounter (HOSPITAL_BASED_OUTPATIENT_CLINIC_OR_DEPARTMENT_OTHER): Admitting: Internal Medicine

## 2023-12-19 DIAGNOSIS — I87312 Chronic venous hypertension (idiopathic) with ulcer of left lower extremity: Secondary | ICD-10-CM | POA: Diagnosis not present

## 2023-12-19 DIAGNOSIS — I890 Lymphedema, not elsewhere classified: Secondary | ICD-10-CM

## 2023-12-19 DIAGNOSIS — L97822 Non-pressure chronic ulcer of other part of left lower leg with fat layer exposed: Secondary | ICD-10-CM

## 2023-12-23 ENCOUNTER — Encounter (HOSPITAL_BASED_OUTPATIENT_CLINIC_OR_DEPARTMENT_OTHER): Attending: Internal Medicine | Admitting: Internal Medicine

## 2023-12-23 DIAGNOSIS — L97822 Non-pressure chronic ulcer of other part of left lower leg with fat layer exposed: Secondary | ICD-10-CM | POA: Diagnosis not present

## 2023-12-23 DIAGNOSIS — I87312 Chronic venous hypertension (idiopathic) with ulcer of left lower extremity: Secondary | ICD-10-CM | POA: Insufficient documentation

## 2023-12-23 DIAGNOSIS — I890 Lymphedema, not elsewhere classified: Secondary | ICD-10-CM | POA: Diagnosis not present

## 2023-12-29 ENCOUNTER — Ambulatory Visit (HOSPITAL_COMMUNITY)
Admission: RE | Admit: 2023-12-29 | Discharge: 2023-12-29 | Disposition: A | Discharge status: Home / Self Care | Source: Ambulatory Visit | Attending: Hematology and Oncology | Admitting: Hematology and Oncology

## 2023-12-29 DIAGNOSIS — Z8542 Personal history of malignant neoplasm of other parts of uterus: Secondary | ICD-10-CM | POA: Insufficient documentation

## 2023-12-29 DIAGNOSIS — Z452 Encounter for adjustment and management of vascular access device: Secondary | ICD-10-CM | POA: Diagnosis present

## 2023-12-29 DIAGNOSIS — C55 Malignant neoplasm of uterus, part unspecified: Secondary | ICD-10-CM

## 2023-12-29 HISTORY — PX: IR REMOVAL TUN ACCESS W/ PORT W/O FL MOD SED: IMG2290

## 2023-12-29 MED ORDER — LIDOCAINE HCL 1 % IJ SOLN
20.0000 mL | Freq: Once | INTRAMUSCULAR | Status: AC
  Administered 2023-12-29: 10 mL via INTRADERMAL

## 2023-12-29 MED ORDER — LIDOCAINE HCL 1 % IJ SOLN
INTRAMUSCULAR | Status: AC
  Filled 2023-12-29: qty 20

## 2023-12-30 ENCOUNTER — Encounter (HOSPITAL_BASED_OUTPATIENT_CLINIC_OR_DEPARTMENT_OTHER): Admitting: Internal Medicine

## 2023-12-30 DIAGNOSIS — I87312 Chronic venous hypertension (idiopathic) with ulcer of left lower extremity: Secondary | ICD-10-CM | POA: Diagnosis not present

## 2023-12-30 DIAGNOSIS — I890 Lymphedema, not elsewhere classified: Secondary | ICD-10-CM

## 2023-12-30 DIAGNOSIS — L97822 Non-pressure chronic ulcer of other part of left lower leg with fat layer exposed: Secondary | ICD-10-CM

## 2024-02-02 ENCOUNTER — Inpatient Hospital Stay: Attending: Hematology and Oncology | Admitting: Psychiatry

## 2024-02-02 ENCOUNTER — Encounter: Payer: Self-pay | Admitting: Psychiatry

## 2024-02-02 VITALS — BP 132/78 | HR 87 | Temp 98.1°F | Resp 20 | Wt 267.6 lb

## 2024-02-02 DIAGNOSIS — C541 Malignant neoplasm of endometrium: Secondary | ICD-10-CM

## 2024-02-02 DIAGNOSIS — Z9221 Personal history of antineoplastic chemotherapy: Secondary | ICD-10-CM | POA: Diagnosis not present

## 2024-02-02 DIAGNOSIS — Z8542 Personal history of malignant neoplasm of other parts of uterus: Secondary | ICD-10-CM | POA: Insufficient documentation

## 2024-02-02 DIAGNOSIS — R590 Localized enlarged lymph nodes: Secondary | ICD-10-CM | POA: Insufficient documentation

## 2024-02-02 NOTE — Progress Notes (Signed)
 Gynecologic Oncology Return Clinic Visit  Date of Service: 02/02/2024 Referring Provider: Almarie Bedford, MD  Assessment & Plan: [name] is a 78 y.o. 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  Now currently off of treatment.  She presents for endometrial cancer surveillance.  Recurrent endometrial cancer: - Clinically NED on exam today. - Last CT on 10/28/23 overall NED, left inguinal lymphadenopathy likely reactive per report, stable - Signs and symptoms of recurrence reviewed with patient. - Given now 2 years after completion of maintenance therapy, can likely space surveillance.  - Dr. Bedford planning follow-up in 1 year. I will see her back in 6 months.    RTC 70mo.  Leslie Masters, MD Gynecologic Oncology   Medical Decision Making I personally spent  TOTAL 20 minutes face-to-face and non-face-to-face in the care of this patient, which includes all pre, intra, and post visit time on the date of service.   ----------------------- Reason for Visit: Surveillance  Treatment History: Oncology History Overview Note  Hx of endometrioid cancer in 2012 (FIGO grade II, [id number]), recurrent disease in 2020 MMR: abnormal MSI: High Genetics are negative   Uterine cancer (HCC)  07/03/2010 Pathology Results   1. Uterus +/- tubes/ovaries, neoplastic, with left fallopian tube and ovary - INVASIVE ENDOMETRIOID CARCINOMA (1.5 CM), FIGO GRADE II, ARISING IN A BACKGROUND OF ATYPICAL COMPLEX HYPERPLASIA, CONFINED WITHIN INNER HALF OF THE MYOMETRIUM. - ENDOMETRIAL POLYP WITH ASSOCIATED ATYPICAL COMPLEX HYPERPLASIA. - MYOMETRIUM: LEIOMYOMATA. - CERVIX: BENIGN SQUAMOUS MUCOSA AND ENDOCERVICAL MUCOSA, NO DYSPLASIA OR MALIGNANCY. - LEFT OVARY: BENIGN OVARIAN TISSUE WITH ENDOSALPINGOSIS, NO EVIDENCE OF ATYPIA OR MALIGNANCY. - LEFT  FALLOPIAN TUBE: NO HISTOLOGIC ABNORMALITIES. - PLEASE SEE ONCOLOGY TEMPLATE FOR DETAIL. 2. Ovary and fallopian tube, right - BENIGN OVARIAN TISSUE WITH ENDOSALPINGOSIS, NO ATYPIA OR MALIGNANCY. - BENIGN FALLOPIAN TUBAL TISSUE, NO PATHOLOGIC ABNORMALITIES. Microscopic Comment 1. UTERUS Specimen: Uterus, cervix, bilateral ovaries and fallopian tubes Procedure: Total hysterectomy and bilateral salpingo-oophorectomy Lymph node sampling performed: No Specimen integrity: Intact Maximum tumor size (cm): 1.5 cm, glass slide measurement Histologic type: Invasive endometrioid carcinoma Grade: FIGO grade II Myometrial invasion: 1 cm where myometrium is 2.3 cm in thickness Cervical stromal involvement: No Extent of involvement of other organs: No Lymph vascular invasion: Not identified Peritoneal washings: Negative ([id number]) Lymph nodes: number examined N/A; number positive N/A TNM code: pT1a, pNX 1 oFf 3IGO Stage (based on pathologic findings, needs clinical correlation): IA  Comments: Sections the endomyometrium away from the grossly identified endometrial polyp show an invasive FIGO grade II endometrioid carcinoma. The tumor is confined within inner half of the myometrium. No angiolymphatic invasion is identified. No cervical stromal involvement is identified. Sections of the grossly identified endometrial polyp show an endometrial polyp with associated atypical compacted hyperplasia with no definitive evidence of carcinoma.   12/07/2017 Imaging   US  venous Doppler Right: No evidence of common femoral vein obstruction. Left: Findings consistent with acute deep vein thrombosis involving the left femoral vein, left proximal profunda vein, and left popliteal vein. Unable to adequately interrogate the common femoral and higher, or the calf secondary to significant edema and body habitus   12/07/2017 [facility] Admission   She presented to the ER and was diagnosed with acute DVT   01/18/2018 -  01/21/2018 Hospital Admission   She was admitted to the hospital for management of severe persistent DVT  01/18/2018 Imaging   US  venous Doppler Right: No evidence of common femoral vein obstruction. Left: Findings consistent with acute deep vein thrombosis involving the left common femoral vein, and left popliteal vein.   01/19/2018 Surgery   Pre-operative Diagnosis: Subacute DVT with severe post thrombotic syndrome Post-operative diagnosis:  Same Surgeon:  Leslie C. Sheree, MD Procedure Performed: 1.  Ultrasound-guided cannulation left small saphenous vein 2.  Left lower extremity and central venography 3.  Intravascular ultrasound of left popliteal, femoral, common femoral, external and common iliac veins and IVC 4.  Stent of left common and external iliac veins with 14 x 60 mm Vici 5.  Moderate sedation with fentanyl  and Versed  for 50 minutes   Indications: 78 year old female with a history of DVT in October now presents with persistent left lower extremity swelling and ultrasound demonstrating likely persistent DVT.  She has been on Xarelto  at this time.  She is now indicated for venogram possible intervention.   Findings: Flow in the left lower extremity was stagnant throughout but by venogram all veins were patent.  There was a focal occlusive area approximately 2 cm in length at the common and external iliac vein junction at the hypogastric on the left.  After stenting and ballooning we had a diameter of 12 millimeters in the stent and venogram demonstrated flow in the lower extremity veins were previously was stagnant and no further residual stenosis in the left common and external iliac vein junction.   04/12/2018 Imaging   US  Venous Doppler Right: No evidence of common femoral vein obstruction. Left: There is no evidence of deep vein thrombosis in the lower extremity. However, portions of this examination were limited- see technologist comments above. Left groin: Large hypoechoic  area with mixed echoes noted measuring nearly 10 cm. Possible  hematoma versus unknown etiology. Ultrasound characteristics of enlarged lymph nodes noted in the groin.      05/15/2018 Imaging   US  Venous Doppler Right: No evidence of deep vein thrombosis in the lower extremity. No indirect evidence of obstruction proximal to the inguinal ligament. Left: No reflux was noted in the common femoral vein , femoral vein in the thigh, popliteal vein, great saphenous vein at the saphenofemoral junction, great saphenous vein at the proximal thigh, great saphenous vein at the mid thigh, great saphenous vein  at the distal thigh, great saphenous vein at the knee, origin of the small saphenous vein, proximal small saphenous vein, and mid small saphenous vein. There is no evidence of deep vein thrombosis in the lower extremity. There is no evidence of superficial venous thrombosis. No cystic structure found in the popliteal fossa. Unable to evaluate extension of common femoral vein obstruction proximal to the inguinal ligament.   06/01/2018 Imaging   1. 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 Additionally, there is lucency involving the medial sidewall of the left acetabulum with potential nondisplaced pathologic fracture. Further evaluation with contrast-enhanced pelvic MRI could be performed as clinically indicated. 2. The left pelvic arterial and venous system is encased by this infiltrative left pelvic sidewall mass however while difficult to ascertain, the left external iliac venous stent appears patent.   06/18/2018 Pathology Results   Lymph node for lymphoma, Left Inguinal - METASTATIC ADENOCARCINOMA, SEE COMMENT. Microscopic Comment Immunohistochemistry is positive for cytokeratin 7, PAX8, ER, and PR. Cytokeratin 5/6,and p63 are negative. The immunoprofile along with the patient's history are  consistent with a gynecologic primary.  06/18/2018 Surgery   Pre-op Diagnosis: INGUINAL LYMPHADENOPATHY, PELVIC MASS      Procedure(s): EXCISIONAL BIOPSY DEEP LEFT INGUINAL LYMPH NODE   Surgeon(s): Vernetta Berg, MD      06/24/2018 Cancer Staging   Staging form: Corpus Uteri - Carcinoma and Carcinosarcoma, AJCC 8th Edition - Clinical: Stage IVB (cT1a, cN2, pM1) - Signed by Lonn Hicks, MD on 06/24/2018    Genetic Testing   Patient has genetic testing done for MMR on pathology from 06/18/2018. Results revealed patient has the following mutation(s): MMR: abnormal   06/29/2018 Procedure   Placement of a subcutaneous port device. Catheter tip at the SVC and right atrium junction.    Genetic Testing   Patient has genetic testing done for MSI on pathology from 06/18/2018. Results revealed patient has the following mutation(s): MSI: High   07/02/2018 PET scan   Previous hysterectomy, with asymmetric focus of hypermetabolic activity in the left vaginal cuff, suspicious for residual or recurrent carcinoma.   Large hypermetabolic soft tissue mass involving the left pelvic sidewall and acetabulum, consistent with metastatic disease.   No evidence metastatic disease within the abdomen, chest, or neck.   07/09/2018 Tumor Marker   Patient's tumor was tested for the following markers: CA-125 Results of the tumor marker test revealed 9   07/10/2018 - 08/24/2018 Chemotherapy   The patient had carboplatin  and taxol  x 3 cycles   07/17/2018 Genetic Testing   Negative genetic testing on the common hereditary cancer panel.  The Common Hereditary Gene Panel offered by [facility] includes sequencing and/or deletion duplication testing of the following 48 genes: APC, ATM, AXIN2, BARD1, BMPR1A, BRCA1, BRCA2, BRIP1, CDH1, CDK4, CDKN2A (p14ARF), CDKN2A (p16INK4a), CHEK2, CTNNA1, DICER1, EPCAM (Deletion/duplication testing only), GREM1 (promoter region deletion/duplication testing only), KIT, MEN1, MLH1, MSH2,  MSH3, MSH6, MUTYH, NBN, NF1, NHTL1, PALB2, PDGFRA, PMS2, POLD1, POLE, PTEN, RAD50, RAD51C, RAD51D, RNF43, SDHB, SDHC, SDHD, SMAD4, SMARCA4. STK11, TP53, TSC1, TSC2, and VHL.  The following genes were evaluated for sequence changes only: SDHA and HOXB13 c.251G>A variant only. The report date is Jul 17, 2018.    10/03/2018 Imaging   CT abdomen and pelvis 1.  No acute intra-abdominal process. 2. Grossly unchanged left pelvic sidewall mass with osseous involvement of the medial acetabulum. Progressive mild displacement of the associated comminuted pathologic fracture involving the right acetabulum and puboacetabular junction.  3. New venous stents extending from the left common iliac vein origin to the proximal left common femoral vein. The stents are patent.   11/06/2018 -  Chemotherapy   The patient had pembrolizumab  for chemotherapy treatment.     01/28/2019 Imaging   1. No substantial interval change in exam. 2. Interval development of mild fullness in the left intrarenal collecting system and ureter without overt hydronephrosis at this time. 3. Abnormal soft tissue along the left pelvic sidewall has decreased slightly in the interval. 4. Similar appearance of ill-defined fascial planes in the pelvis with some peritoneal thickening along the right pelvic sidewall and potentially involving the sigmoid mesocolon. 5. No substantial ascites.   05/03/2019 Imaging   1. Stable mild left pelvic sidewall soft tissue density. No new or progressive disease identified within the abdomen or pelvis.  2. Colonic diverticulosis. No radiographic evidence of diverticulitis.   Aortic Atherosclerosis (ICD10-I70.0).   09/09/2019 Imaging   1. No change in appearance of soft tissue thickening along the LEFT pelvic sidewall adjacent to chronic LEFT acetabular fracture. 2. Mild asymmetry of the bladder wall favoring the LEFT bladder wall, not well  assessed. Similar accounting for variable degrees of distension on  prior studies potentially related to prior radiation, attention on follow-up. 3. Signs of venous stenting in the LEFT hemipelvis with LEFT lower extremity muscular atrophy and mild stranding with similar appearance. Signs of colonic diverticulosis and diverticular disease without change.   02/24/2020 Imaging   1. Unchanged appearance of the pelvis as detailed below. 2. Unchanged soft tissue thickening of the left pelvic sidewall. 3. 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 4. No evidence discrete mass or lymphadenopathy nor metastatic disease in the abdomen or pelvis. 5. Status post hysterectomy and cholecystectomy. 6. Left common iliac vein stent. 7. Pancolonic diverticulosis.     09/07/2020 Imaging   Stable abnormal soft tissue density in the left pelvic sidewall. No new or progressive disease within the abdomen or pelvis.   Colonic diverticulosis. No radiographic evidence of diverticulitis.   Stable severe destructive left hip arthropathy with fracture involving the medial acetabular wall.     03/01/2021 Imaging   Stable abnormal soft tissue density in the left pelvic sidewall. 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   No new or progressive disease within the abdomen or pelvis.   Colonic diverticulosis, without radiographic evidence of diverticulitis.   Tiny hiatal hernia.   11/08/2021 Imaging   1. 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 2. Cannot exclude wall thickening in the lower rectum, correlate with digital rectal exam. 3. Other imaging findings of potential clinical significance: Small type 1 hiatal hernia. Colonic diverticulosis. Left common iliac vein  and external iliac vein stent graft. 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 Small umbilical hernia contains adipose tissue.   05/13/2022 Imaging   Old/treated left acetabular metastasis with chronic adjacent pelvic sidewall soft tissue thickening. No evidence of recurrent or progressive metastatic disease.   10/29/2022 Imaging   CT ABDOMEN PELVIS W CONTRAST  Result Date: 10/30/2022 CLINICAL DATA:  High risk endometrial cancer; monitor. Evaluate for recurrence. * Tracking Code: BO * EXAM: CT ABDOMEN AND PELVIS WITH CONTRAST TECHNIQUE: Multidetector CT imaging of the abdomen and pelvis was performed using the standard protocol following bolus administration of intravenous contrast. RADIATION DOSE REDUCTION: This exam was performed according to the departmental dose-optimization program which includes automated exposure control, adjustment of the mA and/or kV according to patient size and/or use of iterative reconstruction technique. CONTRAST:  OMNIPAQUE  IOHEXOL  300 MG/ML  SOLN COMPARISON:  Abdominopelvic CT 05/10/2022 and 11/06/2021. FINDINGS: Lower chest: Stable mild central airway thickening and lingular scarring. No confluent airspace opacity, suspicious pulmonary nodule or confluent airspace disease. Stable small hiatal hernia. Hepatobiliary: No focal hepatic abnormalities are identified. No evidence of biliary dilatation status post cholecystectomy. Pancreas: Unremarkable. No pancreatic ductal dilatation or surrounding inflammatory changes. Spleen: Normal in size without focal abnormality. Adrenals/Urinary Tract: Both adrenal glands appear normal. No evidence of urinary tract calculus, suspicious renal lesion or hydronephrosis. The bladder appears unremarkable for its degree of distention. Stomach/Bowel: No enteric contrast administered. As above, stable small hiatal hernia. The stomach  otherwise appears unremarkable for its degree of distention. No bowel distension, wall thickening or surrounding inflammation. The appendix appears normal. There is diffuse colonic diverticulosis without evidence of acute inflammation.  Vascular/Lymphatic: There are no enlarged abdominal or pelvic lymph nodes. No acute vascular findings. Minimal aortoiliac atherosclerosis. Previous stenting of the left common and external iliac veins. Reproductive: Status post hysterectomy. Low-density structure superior to the bladder on the right measuring 3.5 x 2.4 cm on image 57/3 is unchanged from the recent prior studies, although enlarged from older prior studies. No solid adnexal mass identified. Other: No ascites or peritoneal nodularity. No pneumoperitoneum. The abdominal wall appears intact. Stable mild subcutaneous edema. Musculoskeletal: No acute osseous findings or evidence of osseous metastatic disease. 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 Previous imaging has demonstrated a pathologic fracture in this area. There is stable chronic soft tissue thickening along the left pelvic sidewall. Multilevel spondylosis. Unless specific follow-up recommendations are mentioned in the findings or impression sections, no imaging follow-up of any mentioned incidental findings is recommended. IMPRESSION: 1. No definite evidence of local recurrence or metastatic disease in the abdomen or pelvis. 2. Indeterminate low-density structure superior to the bladder on the right, stable from recent prior studies, possibly a lymphocele or other benign finding. Correlate with tumor markers. Recommend attention on follow-up. 3. Diffuse colonic diverticulosis without evidence of acute inflammation. 4. Stable sequela of previously demonstrated pathologic left hip fracture with severe left hip arthropathy. 5.  Aortic Atherosclerosis (ICD10-I70.0). Electronically Signed   By: Elsie Perone M.D.   On:  10/30/2022 15:57      04/29/2023 Imaging   1. Stable CT abdomen/pelvis. No findings for abdominal/pelvic metastatic disease. 2. Stable 3.2 x 2.6 cm cystic lesion in the pelvis just superior to the bladder, most likely a postoperative fluid collection/seroma or liquified hematoma. 3. 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   10/28/2023 Imaging   CT ABDOMEN PELVIS W CONTRAST Result Date: 11/03/2023 CLINICAL DATA:  Uterine cancer EXAM: CT ABDOMEN AND PELVIS WITH CONTRAST TECHNIQUE: Multidetector CT imaging of the abdomen and pelvis was performed using the standard protocol following bolus administration of intravenous contrast. RADIATION DOSE REDUCTION: This exam was performed according to the departmental dose-optimization program which includes automated exposure control, adjustment of the mA and/or kV according to patient size and/or use of iterative reconstruction technique. CONTRAST:  OMNIPAQUE  IOHEXOL  300 MG/ML  SOLN COMPARISON:  CT abdomen pelvis April 29, 2023 FINDINGS: Lower chest: Unremarkable Hepatobiliary: No focal liver abnormality is seen. Cholecystectomy. No biliary dilatation. Pancreas: Unremarkable. No pancreatic ductal dilatation or surrounding inflammatory changes. Spleen: Normal in size without focal abnormality. Adrenals/Urinary Tract: Thickening of bilateral adrenal glands. Both kidneys are normal. No hydronephrosis or mass lesion. Stomach/Bowel: Colonic diverticulosis without diverticulitis. There is a small hiatal hernia. Stomach and small bowel loops are normal. Vascular/Lymphatic: Few subcentimeter mesenteric root lymph nodes. Left inguinal lymphadenopathy, index node measures 1.7 x 2.2 cm Diffuse mesenteric fat stranding consistent with Misty mesentery. Atherosclerotic calcifications of aorta left iliofemoral stent graft. Reproductive: Hysterectomy.Again seen there is a hypodense in right pelvic cavity in right adnexa measuring 2.7 x 2.2 cm containing  fluid, previously measured 3.2 x 2.6 cm which may represent a lymphocele versus right ovary containing a follicle. Other: Postsurgical changes in left inguinal region. Diffuse Misty mesentery. No free fluid. Musculoskeletal: Severe degenerative changes of left hip joint. 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 No suspicious osseous lesion to suggest metastatic disease IMPRESSION: No suspicious findings to suggest recurrent malignancy or new distant metastatic disease. Left inguinal enhancing lymphadenopathy, likely reactive and stable to prior. Nodal metastasis can not be excluded. Hysterectomy without suspicious  finding at the vaginal cuff. Stable right adnexal fluid attenuation structure likely a postsurgical lymphocele. Sclerotic changes of left acetabulum and severe degenerative changes of left hip joint. Left iliofemoral stent graft. Misty mesentery. Electronically Signed   By: Megan  Zare M.D.   On: 11/03/2023 16:09      12/29/2023 Procedure   Removal of implanted Port-A-Cath utilizing sharp and blunt dissection. The procedure was uncomplicated.       Metastasis to lymph nodes (HCC)  06/23/2018 Initial Diagnosis   Metastasis to lymph nodes (HCC)   07/10/2018 - 08/24/2018 Chemotherapy   The patient had palonosetron  (ALOXI ) injection 0.25 mg, 0.25 mg, Intravenous,  Once, 3 of 6 cycles Administration: 0.25 mg (07/10/2018), 0.25 mg (07/31/2018), 0.25 mg (08/24/2018) CARBOplatin  (PARAPLATIN ) 480 mg in sodium chloride  0.9 % 250 mL chemo infusion, 480 mg (100 % of original dose 482.5 mg), Intravenous,  Once, 3 of 6 cycles Dose modification: 482.5 mg (original dose 482.5 mg, Cycle 1) Administration: 480 mg (07/10/2018), 480 mg (07/31/2018), 480 mg (08/24/2018) PACLitaxel  (TAXOL ) 276 mg in sodium chloride  0.9 % 250 mL chemo infusion (> 80mg /m2), 140 mg/m2 = 276 mg (80 % of original dose 175 mg/m2), Intravenous,  Once, 3 of 6 cycles Dose modification: 140 mg/m2 (80 % of original  dose 175 mg/m2, Cycle 1, Reason: Dose Not Tolerated) Administration: 276 mg (07/10/2018), 276 mg (07/31/2018), 276 mg (08/24/2018) fosaprepitant  (EMEND ) 150 mg, dexamethasone  (DECADRON ) 12 mg in sodium chloride  0.9 % 145 mL IVPB, , Intravenous,  Once, 3 of 6 cycles Administration:  (07/10/2018),  (07/31/2018),  (08/24/2018)  for chemotherapy treatment.    11/06/2018 -  Chemotherapy   The patient had pembrolizumab  for chemotherapy treatment.     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 (HCC)  06/24/2018 Initial Diagnosis   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 (HCC)   07/10/2018 - 08/24/2018 Chemotherapy   The patient had palonosetron  (ALOXI ) injection 0.25 mg, 0.25 mg, Intravenous,  Once, 3 of 6 cycles Administration: 0.25 mg (07/10/2018), 0.25 mg (07/31/2018), 0.25 mg (08/24/2018) CARBOplatin  (PARAPLATIN ) 480 mg in sodium chloride  0.9 % 250 mL chemo infusion, 480 mg (100 % of original dose 482.5 mg), Intravenous,  Once, 3 of 6 cycles Dose modification: 482.5 mg (original dose 482.5 mg, Cycle 1) Administration: 480 mg (07/10/2018), 480 mg (07/31/2018), 480 mg (08/24/2018) PACLitaxel  (TAXOL ) 276 mg in sodium chloride  0.9 % 250 mL chemo infusion (> 80mg /m2), 140 mg/m2 = 276 mg (80 % of original dose 175 mg/m2), Intravenous,  Once, 3 of 6 cycles Dose modification: 140 mg/m2 (80 % of original dose 175 mg/m2, Cycle 1, Reason: Dose Not Tolerated) Administration: 276 mg (07/10/2018), 276 mg (07/31/2018), 276 mg (08/24/2018) fosaprepitant  (EMEND ) 150 mg, dexamethasone  (DECADRON ) 12 mg in sodium chloride  0.9 % 145 mL IVPB, , Intravenous,  Once, 3 of 6 cycles Administration:  (07/10/2018),  (07/31/2018),  (08/24/2018)  for chemotherapy treatment.    11/06/2018 -  Chemotherapy   The patient had pembrolizumab  for chemotherapy treatment.     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       Interval History: Son died of lung cancer in 12-27-2023.  Going grief counseling.   With insurance, no longer able to have an aide at home.  She otherwise denies vaginal bleeding, abdominal/pelvic pain, unintentional weight loss, change in bowel or bladder habits, early satiety, bloating. Has  had some weight gain since her son died.     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   Peripheral vascular disease    Primary localized osteoarthritis of right knee    Uterine cancer (HCC) 06/23/2018    Past Surgical History:  Procedure Laterality Date   ABDOMINAL HYSTERECTOMY  2012   CHOLECYSTECTOMY N/A 03/09/2013   Procedure: LAPAROSCOPIC CHOLECYSTECTOMY;  Surgeon: Camellia CHRISTELLA Blush, MD;  Location: [facility] OR;  Service: General;  Laterality: N/A;   COLONOSCOPY W/ BIOPSIES     IR IMAGING GUIDED PORT INSERTION  06/29/2018   IR REMOVAL TUN ACCESS W/ PORT W/O FL MOD SED  12/29/2023   LARYNGOSCOPY Left 03/14/2017   Procedure: LARYNGOSCOPY;  Surgeon: Terri Alan PARAS, MD;  Location: [facility] OR;  Service: ENT;  Laterality: Left;   LOWER EXTREMITY VENOGRAPHY Left 01/19/2018   Procedure: LOWER EXTREMITY VENOGRAPHY;  Surgeon: Duncan Leslie Bruckner, MD;  Location: [facility] INVASIVE CV LAB;  Service: Cardiovascular;  Laterality: Left;   LOWER EXTREMITY VENOGRAPHY N/A 09/08/2018   Procedure: LOWER EXTREMITY VENOGRAPHY;  Surgeon: Duncan Leslie Bruckner, MD;  Location: [facility] INVASIVE CV LAB;  Service: Cardiovascular;  Laterality: N/A;   LYMPH NODE BIOPSY Left 06/18/2018   Procedure: EXCISIONAL BIOPSY LEFT INGUINAL LYMPH NODE;  Surgeon: Vernetta Berg, MD;  Location: [facility] OR;  Service: General;  Laterality: Left;   PERIPHERAL VASCULAR INTERVENTION Left 01/19/2018   Procedure: PERIPHERAL VASCULAR INTERVENTION;  Surgeon: Duncan Leslie Bruckner, MD;  Location: [facility] INVASIVE CV LAB;  Service: Cardiovascular;  Laterality: Left;  LEFT ILIAC VENOUS   PERIPHERAL VASCULAR INTERVENTION Left 09/08/2018   Procedure: PERIPHERAL VASCULAR INTERVENTION;  Surgeon: Duncan Leslie Bruckner, MD;  Location: [facility] INVASIVE CV LAB;  Service: Cardiovascular;  Laterality: Left;  lower extremity   TOTAL KNEE ARTHROPLASTY  04/29/2011   Procedure: TOTAL KNEE ARTHROPLASTY;  Surgeon: Lamar DELENA Millman, MD;  Location: [facility] OR;  Service: Orthopedics;  Laterality: Left;  DR MILLMAN CHRISTIAN 90 MINUTES FOR THIS CASE   TOTAL KNEE ARTHROPLASTY Right 07/04/2014   Procedure: TOTAL KNEE ARTHROPLASTY;  Surgeon: Lamar Millman, MD;  Location: [facility] OR;  Service: Orthopedics;  Laterality: Right;    Family History  Problem Relation Age of Onset   Arthritis Mother    Hypertension Mother    Alzheimer's disease Father    Diabetes Sister    Hypertension Sister    Hypertension Sister    Hypertension Sister    Hypertension Sister    Hypertension Brother    Stroke Brother    Hypertension Brother    Breast cancer Niece 46       sister's daughter   Breast cancer Other        Niece   Anesthesia problems Neg Hx    Hypotension Neg Hx    Malignant hyperthermia Neg Hx    Pseudochol deficiency Neg Hx    Colon cancer Neg Hx  Ovarian cancer Neg Hx    Endometrial cancer Neg Hx    Pancreatic cancer Neg Hx    Prostate cancer Neg Hx     Social History   Socioeconomic History   Marital status: Divorced    Spouse name: Not on file   Number of children: 1   Years of education: Not on file    Highest education level: Not on file  Occupational History   Occupation: Retired   Tobacco Use   Smoking status: Former    Current packs/day: 0.00    Types: Cigarettes    Start date: 04/23/1987    Quit date: 04/22/1988    Years since quitting: 35.8   Smokeless tobacco: Never  Vaping Use   Vaping status: Never Used  Substance and Sexual Activity   Alcohol use: No   Drug use: No   Sexual activity: Not Currently    Birth control/protection: Surgical  Other Topics Concern   Not on file  Social History Narrative   Daily caffeine    Social Drivers of Health   Tobacco Use: Medium Risk (11/04/2023)   Patient History    Smoking Tobacco Use: Former    Smokeless Tobacco Use: Never    Passive Exposure: Not on Actuary Strain: Not on file  Food Insecurity: Not on file  Transportation Needs: Not on file  Physical Activity: Not on file  Stress: Not on file  Social Connections: Not on file  Depression (PHQ2-9): Not on file  Alcohol Screen: Not on file  Housing: Not on file  Utilities: Not on file  Health Literacy: Not on file    Current Medications:  Current Outpatient Medications:    acetaminophen  (TYLENOL ) 325 MG tablet, Take by mouth., Disp: , Rfl:    apixaban  (ELIQUIS ) 2.5 MG TABS tablet, Take by mouth 2 (two) times daily., Disp: , Rfl:    diclofenac sodium (VOLTAREN) 1 % GEL, APPLY 4GRAMS 4 TIMES A DAY AS NEEDED FOR PAINS, Disp: , Rfl:    furosemide  (LASIX ) 40 MG tablet, Take 40 mg by mouth daily as needed., Disp: , Rfl:    gabapentin  (NEURONTIN ) 300 MG capsule, Take 300 mg by mouth 3 (three) times daily., Disp: , Rfl:    lidocaine -prilocaine  (EMLA ) cream, Apply 1 application topically daily as needed., Disp: 30 g, Rfl: 3   losartan (COZAAR) 25 MG tablet, Take 12.5 mg by mouth daily., Disp: , Rfl:    methadone  (DOLOPHINE ) 10 MG tablet, Take 1 tablet (10 mg total) by mouth every 12 (twelve) hours., Disp: 60 tablet, Rfl: 0   Olopatadine  HCl 0.2 % SOLN, Place 1  drop into both eyes daily., Disp: , Rfl:    oxyCODONE  10 MG TABS, Take 1 tablet (10 mg total) by mouth every 6 (six) hours as needed for severe pain., Disp: 60 tablet, Rfl: 0   potassium chloride  (KLOR-CON ) 10 MEQ tablet, Take 10 mEq by mouth daily., Disp: , Rfl:    Vitamin D, Ergocalciferol, (DRISDOL) 1.25 MG (50000 UNIT) CAPS capsule, SMARTSIG:1 Pill By Mouth Once a Week, Disp: , Rfl:   Review of Symptoms: Complete 10-system review is positive for: None  Physical Exam: BP (!) 125/103 (BP Location: Left Arm, Patient Position: Sitting)   Pulse 87   Temp 98.1 F (36.7 C)   Resp 20   Wt 267 lb 9.6 oz (121.4 kg)   SpO2 99%   BMI 52.26 kg/m  General: Alert, oriented, no acute distress. HEENT: Normocephalic, atraumatic. Neck symmetric without masses.  Sclera anicteric.  Chest: Normal work of breathing. Clear to auscultation bilaterally.   Cardiovascular: Regular rate and rhythm, no murmurs. Abdomen: Soft, nontender.  Normoactive bowel sounds.  No masses appreciated.  Well-healed incisions. Extremities: Using walker. Limited mobility due to left hip pain.  Darkened and thickened/rough skin of bilateral lower extremities and edema equal bilaterally. Skin:Improvement in skin in bilateral groins and pannus Lymphatics: No cervical, supraclavicular, adenopathy.   GU: Normal appearing external genitalia without erythema, excoriation, or lesions. Additional assistance required to hold left leg due to limited mobility and pain of left hip.  Speculum exam reveals normal vaginal mucosa, no lesions.  Bimanual exam reveals smooth vaginal cuff. Exam limited by body habitus.  Exam chaperoned by Kimberly Jordan, CMA     Laboratory & Radiologic Studies: CT ABDOMEN PELVIS W CONTRAST 10/28/2023  Narrative CLINICAL DATA:  Uterine cancer  EXAM: CT ABDOMEN AND PELVIS WITH CONTRAST  TECHNIQUE: Multidetector CT imaging of the abdomen and pelvis was performed using the standard protocol following bolus  administration of intravenous contrast.  RADIATION DOSE REDUCTION: This exam was performed according to the departmental dose-optimization program which includes automated exposure control, adjustment of the mA and/or kV according to patient size and/or use of iterative reconstruction technique.  CONTRAST:  OMNIPAQUE  IOHEXOL  300 MG/ML  SOLN  COMPARISON:  CT abdomen pelvis April 29, 2023  FINDINGS: Lower chest: Unremarkable  Hepatobiliary: No focal liver abnormality is seen. Cholecystectomy. No biliary dilatation.  Pancreas: Unremarkable. No pancreatic ductal dilatation or surrounding inflammatory changes.  Spleen: Normal in size without focal abnormality.  Adrenals/Urinary Tract: Thickening of bilateral adrenal glands. Both kidneys are normal. No hydronephrosis or mass lesion.  Stomach/Bowel: Colonic diverticulosis without diverticulitis. There is a small hiatal hernia. Stomach and small bowel loops are normal.  Vascular/Lymphatic: Few subcentimeter mesenteric root lymph nodes. Left inguinal lymphadenopathy, index node measures 1.7 x 2.2 cm Diffuse mesenteric fat stranding consistent with Misty mesentery. Atherosclerotic calcifications of aorta left iliofemoral stent graft.  Reproductive: Hysterectomy.Again seen there is a hypodense in right pelvic cavity in right adnexa measuring 2.7 x 2.2 cm containing fluid, previously measured 3.2 x 2.6 cm which may represent a lymphocele versus right ovary containing a follicle.  Other: Postsurgical changes in left inguinal region. Diffuse Misty mesentery. No free fluid.  Musculoskeletal: Severe degenerative changes of left hip joint. 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 No suspicious osseous lesion to suggest metastatic disease  IMPRESSION: No suspicious findings to suggest recurrent malignancy or new distant metastatic disease.  Left inguinal enhancing lymphadenopathy, likely  reactive and stable to prior. Nodal metastasis can not be excluded.  Hysterectomy without suspicious finding at the vaginal cuff. Stable right adnexal fluid attenuation structure likely a postsurgical lymphocele.  Sclerotic changes of left acetabulum and severe degenerative changes of left hip joint.  Left iliofemoral stent graft.  Misty mesentery.   Electronically Signed By: Megan  Zare M.D. On: 11/03/2023 16:09

## 2024-02-02 NOTE — Patient Instructions (Signed)
 It was a pleasure to see you in clinic today. - Reassuring exam today - Return visit planned for  6months  Thank you very much for allowing me to provide care for you today.  I appreciate your confidence in choosing our Gynecologic Oncology team at [facility].  If you have any questions about your visit today please call our office or send us  a MyChart message and we will get back to you as soon as possible.

## 2024-08-09 ENCOUNTER — Inpatient Hospital Stay: Admitting: Psychiatry

## 2024-10-28 ENCOUNTER — Other Ambulatory Visit

## 2024-11-04 ENCOUNTER — Ambulatory Visit: Admitting: Hematology and Oncology
